# Patient Record
Sex: Male | Born: 1970
Health system: Southern US, Community
[De-identification: ages and names within clinical notes are randomized; demographics above are authoritative.]

## PROBLEM LIST (undated history)

## (undated) DIAGNOSIS — R0602 Shortness of breath: Secondary | ICD-10-CM

## (undated) DIAGNOSIS — M1990 Unspecified osteoarthritis, unspecified site: Secondary | ICD-10-CM

## (undated) DIAGNOSIS — T50901A Poisoning by unspecified drugs, medicaments and biological substances, accidental (unintentional), initial encounter: Secondary | ICD-10-CM

## (undated) DIAGNOSIS — S83200A Bucket-handle tear of unspecified meniscus, current injury, right knee, initial encounter: Secondary | ICD-10-CM

## (undated) DIAGNOSIS — G8929 Other chronic pain: Secondary | ICD-10-CM

## (undated) DIAGNOSIS — B370 Candidal stomatitis: Secondary | ICD-10-CM

## (undated) DIAGNOSIS — M25562 Pain in left knee: Secondary | ICD-10-CM

## (undated) DIAGNOSIS — J449 Chronic obstructive pulmonary disease, unspecified: Secondary | ICD-10-CM

## (undated) DIAGNOSIS — H269 Unspecified cataract: Secondary | ICD-10-CM

## (undated) DIAGNOSIS — K219 Gastro-esophageal reflux disease without esophagitis: Secondary | ICD-10-CM

## (undated) DIAGNOSIS — G4730 Sleep apnea, unspecified: Secondary | ICD-10-CM

## (undated) DIAGNOSIS — I509 Heart failure, unspecified: Secondary | ICD-10-CM

## (undated) DIAGNOSIS — G9340 Encephalopathy, unspecified: Secondary | ICD-10-CM

## (undated) DIAGNOSIS — J45909 Unspecified asthma, uncomplicated: Secondary | ICD-10-CM

## (undated) DIAGNOSIS — M549 Dorsalgia, unspecified: Secondary | ICD-10-CM

## (undated) DIAGNOSIS — F419 Anxiety disorder, unspecified: Secondary | ICD-10-CM

## (undated) DIAGNOSIS — R109 Unspecified abdominal pain: Secondary | ICD-10-CM

## (undated) HISTORY — DX: Bucket-handle tear of unspecified meniscus, current injury, right knee, initial encounter: S83.200A

## (undated) HISTORY — PX: CARPAL TUNNEL RELEASE: SHX101

## (undated) HISTORY — DX: Sleep apnea, unspecified: G47.30

## (undated) HISTORY — DX: Chronic obstructive pulmonary disease, unspecified: J44.9

## (undated) HISTORY — DX: Unspecified cataract: H26.9

## (undated) HISTORY — PX: SHOULDER ARTHROCENTESIS: SHX1048

## (undated) HISTORY — DX: Poisoning by unspecified drugs, medicaments and biological substances, accidental (unintentional), initial encounter: T50.901A

## (undated) HISTORY — DX: Unspecified abdominal pain: R10.9

---
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
## 2001-03-14 ENCOUNTER — Encounter: Payer: Self-pay | Admitting: Emergency Medicine

## 2001-03-14 ENCOUNTER — Emergency Department (HOSPITAL_COMMUNITY): Admission: EM | Admit: 2001-03-14 | Discharge: 2001-03-14 | Payer: Self-pay | Admitting: Emergency Medicine

## 2002-10-22 ENCOUNTER — Encounter: Payer: Self-pay | Admitting: Family Medicine

## 2002-10-22 ENCOUNTER — Inpatient Hospital Stay (HOSPITAL_COMMUNITY): Admission: AD | Admit: 2002-10-22 | Discharge: 2002-10-25 | Payer: Self-pay | Admitting: Family Medicine

## 2003-12-13 ENCOUNTER — Ambulatory Visit (HOSPITAL_COMMUNITY): Admission: RE | Admit: 2003-12-13 | Discharge: 2003-12-13 | Payer: Self-pay | Admitting: Family Medicine

## 2006-07-29 ENCOUNTER — Ambulatory Visit (HOSPITAL_COMMUNITY): Admission: RE | Admit: 2006-07-29 | Discharge: 2006-07-29 | Payer: Self-pay | Admitting: Family Medicine

## 2007-01-14 ENCOUNTER — Encounter: Admission: RE | Admit: 2007-01-14 | Discharge: 2007-01-14 | Payer: Self-pay | Admitting: Orthopaedic Surgery

## 2007-01-20 ENCOUNTER — Ambulatory Visit (HOSPITAL_BASED_OUTPATIENT_CLINIC_OR_DEPARTMENT_OTHER): Admission: RE | Admit: 2007-01-20 | Discharge: 2007-01-20 | Payer: Self-pay | Admitting: Orthopaedic Surgery

## 2008-05-05 ENCOUNTER — Emergency Department (HOSPITAL_COMMUNITY): Admission: EM | Admit: 2008-05-05 | Discharge: 2008-05-05 | Payer: Self-pay | Admitting: Emergency Medicine

## 2008-08-15 ENCOUNTER — Emergency Department (HOSPITAL_COMMUNITY): Admission: EM | Admit: 2008-08-15 | Discharge: 2008-08-15 | Payer: Self-pay | Admitting: Emergency Medicine

## 2008-12-02 HISTORY — PX: HERNIA REPAIR: SHX51

## 2009-07-15 ENCOUNTER — Emergency Department (HOSPITAL_COMMUNITY): Admission: EM | Admit: 2009-07-15 | Discharge: 2009-07-15 | Payer: Self-pay | Admitting: Emergency Medicine

## 2009-09-22 ENCOUNTER — Ambulatory Visit (HOSPITAL_COMMUNITY): Admission: RE | Admit: 2009-09-22 | Discharge: 2009-09-22 | Payer: Self-pay | Admitting: Family Medicine

## 2009-10-01 ENCOUNTER — Emergency Department (HOSPITAL_COMMUNITY): Admission: EM | Admit: 2009-10-01 | Discharge: 2009-10-01 | Payer: Self-pay | Admitting: Emergency Medicine

## 2009-10-03 ENCOUNTER — Ambulatory Visit: Payer: Self-pay | Admitting: Gastroenterology

## 2009-10-03 DIAGNOSIS — R109 Unspecified abdominal pain: Secondary | ICD-10-CM

## 2009-10-03 DIAGNOSIS — R1084 Generalized abdominal pain: Secondary | ICD-10-CM | POA: Insufficient documentation

## 2009-10-03 HISTORY — DX: Unspecified abdominal pain: R10.9

## 2009-10-04 ENCOUNTER — Encounter (INDEPENDENT_AMBULATORY_CARE_PROVIDER_SITE_OTHER): Payer: Self-pay | Admitting: *Deleted

## 2009-10-04 ENCOUNTER — Ambulatory Visit (HOSPITAL_COMMUNITY): Admission: RE | Admit: 2009-10-04 | Discharge: 2009-10-04 | Payer: Self-pay | Admitting: Gastroenterology

## 2009-10-04 ENCOUNTER — Ambulatory Visit: Payer: Self-pay | Admitting: Gastroenterology

## 2009-10-04 ENCOUNTER — Encounter: Payer: Self-pay | Admitting: Gastroenterology

## 2009-11-22 ENCOUNTER — Encounter (INDEPENDENT_AMBULATORY_CARE_PROVIDER_SITE_OTHER): Payer: Self-pay | Admitting: *Deleted

## 2009-11-23 ENCOUNTER — Ambulatory Visit (HOSPITAL_COMMUNITY): Admission: RE | Admit: 2009-11-23 | Discharge: 2009-11-23 | Payer: Self-pay | Admitting: Surgery

## 2010-04-26 ENCOUNTER — Encounter
Admission: RE | Admit: 2010-04-26 | Discharge: 2010-07-25 | Payer: Self-pay | Admitting: Physical Medicine & Rehabilitation

## 2010-05-01 ENCOUNTER — Ambulatory Visit: Payer: Self-pay | Admitting: Physical Medicine & Rehabilitation

## 2010-06-21 ENCOUNTER — Ambulatory Visit: Payer: Self-pay | Admitting: Physical Medicine & Rehabilitation

## 2010-07-01 ENCOUNTER — Emergency Department (HOSPITAL_COMMUNITY): Admission: EM | Admit: 2010-07-01 | Discharge: 2010-07-01 | Payer: Self-pay | Admitting: Emergency Medicine

## 2010-07-20 ENCOUNTER — Ambulatory Visit: Payer: Self-pay | Admitting: Physical Medicine & Rehabilitation

## 2010-08-08 ENCOUNTER — Encounter
Admission: RE | Admit: 2010-08-08 | Discharge: 2010-08-15 | Payer: Self-pay | Source: Home / Self Care | Admitting: Physical Medicine & Rehabilitation

## 2010-08-15 ENCOUNTER — Ambulatory Visit: Payer: Self-pay | Admitting: Physical Medicine & Rehabilitation

## 2011-02-03 ENCOUNTER — Emergency Department (HOSPITAL_COMMUNITY)
Admission: EM | Admit: 2011-02-03 | Discharge: 2011-02-03 | Disposition: A | Discharge status: Home / Self Care | Payer: BC Managed Care – PPO | Attending: Emergency Medicine | Admitting: Emergency Medicine

## 2011-02-03 DIAGNOSIS — Y92009 Unspecified place in unspecified non-institutional (private) residence as the place of occurrence of the external cause: Secondary | ICD-10-CM | POA: Insufficient documentation

## 2011-02-03 DIAGNOSIS — W268XXA Contact with other sharp object(s), not elsewhere classified, initial encounter: Secondary | ICD-10-CM | POA: Insufficient documentation

## 2011-02-03 DIAGNOSIS — W01119A Fall on same level from slipping, tripping and stumbling with subsequent striking against unspecified sharp object, initial encounter: Secondary | ICD-10-CM | POA: Insufficient documentation

## 2011-02-03 DIAGNOSIS — S61409A Unspecified open wound of unspecified hand, initial encounter: Secondary | ICD-10-CM | POA: Insufficient documentation

## 2011-02-16 LAB — BASIC METABOLIC PANEL
BUN: 8 mg/dL (ref 6–23)
CO2: 25 mEq/L (ref 19–32)
Calcium: 8.9 mg/dL (ref 8.4–10.5)
Chloride: 107 mEq/L (ref 96–112)
Creatinine, Ser: 0.82 mg/dL (ref 0.4–1.5)
GFR calc Af Amer: >60 mL/min (ref >60)
GFR calc non Af Amer: >60 mL/min (ref >60)
Glucose, Bld: 122 mg/dL — ABNORMAL HIGH (ref 70–99)
Potassium: 3.7 mEq/L (ref 3.5–5.1)
Sodium: 139 mEq/L (ref 135–145)

## 2011-02-16 LAB — CBC
HCT: 40.6 % (ref 39.0–52.0)
Hemoglobin: 13.9 g/dL (ref 13.0–17.0)
MCH: 31.3 pg (ref 26.0–34.0)
MCHC: 34.2 g/dL (ref 30.0–36.0)
MCV: 91.5 fL (ref 78.0–100.0)
Platelets: 236 10*3/uL (ref 150–400)
RBC: 4.44 MIL/uL (ref 4.22–5.81)
RDW: 13 % (ref 11.5–15.5)
WBC: 12.1 K/uL — ABNORMAL HIGH (ref 4.0–10.5)

## 2011-02-16 LAB — URINALYSIS, ROUTINE W REFLEX MICROSCOPIC
Bilirubin Urine: NEGATIVE
Glucose, UA: NEGATIVE mg/dL
Hgb urine dipstick: NEGATIVE
Nitrite: NEGATIVE
Protein, ur: NEGATIVE mg/dL
Specific Gravity, Urine: >1.03 — ABNORMAL HIGH (ref 1.005–1.030)
Urobilinogen, UA: 0.2 mg/dL (ref 0.0–1.0)
pH: 6 (ref 5.0–8.0)

## 2011-02-16 LAB — DIFFERENTIAL
Basophils Absolute: 0.1 K/uL (ref 0.0–0.1)
Basophils Relative: 1 % (ref 0–1)
Eosinophils Absolute: 0.5 10*3/uL (ref 0.0–0.7)
Eosinophils Relative: 4 % (ref 0–5)
Lymphocytes Relative: 25 % (ref 12–46)
Lymphs Abs: 3 10*3/uL (ref 0.7–4.0)
Monocytes Absolute: 0.8 K/uL (ref 0.1–1.0)
Monocytes Relative: 7 % (ref 3–12)
Neutro Abs: 7.7 10*3/uL (ref 1.7–7.7)
Neutrophils Relative %: 63 % (ref 43–77)

## 2011-03-04 LAB — COMPREHENSIVE METABOLIC PANEL
ALT: 26 U/L (ref 0–53)
AST: 21 U/L (ref 0–37)
Albumin: 3.9 g/dL (ref 3.5–5.2)
Alkaline Phosphatase: 44 U/L (ref 39–117)
CO2: 29 mEq/L (ref 19–32)
Chloride: 101 mEq/L (ref 96–112)
GFR calc non Af Amer: >60 mL/min (ref >60)
Total Bilirubin: 0.6 mg/dL (ref 0.3–1.2)

## 2011-03-04 LAB — GLUCOSE, CAPILLARY: Glucose-Capillary: 206 mg/dL — ABNORMAL HIGH (ref 70–99)

## 2011-03-06 LAB — GLUCOSE, CAPILLARY: Glucose-Capillary: 160 mg/dL — ABNORMAL HIGH (ref 70–99)

## 2011-03-07 LAB — DIFFERENTIAL
Basophils Relative: 1 % (ref 0–1)
Eosinophils Relative: 3 % (ref 0–5)
Lymphs Abs: 2.9 10*3/uL (ref 0.7–4.0)
Monocytes Absolute: 0.5 10*3/uL (ref 0.1–1.0)
Neutro Abs: 6.4 10*3/uL (ref 1.7–7.7)

## 2011-03-07 LAB — BASIC METABOLIC PANEL
BUN: 5 mg/dL — ABNORMAL LOW (ref 6–23)
Chloride: 103 mEq/L (ref 96–112)
Sodium: 138 mEq/L (ref 135–145)

## 2011-03-07 LAB — CBC
Hemoglobin: 16 g/dL (ref 13.0–17.0)
MCHC: 35 g/dL (ref 30.0–36.0)
MCV: 90.5 fL (ref 78.0–100.0)
RDW: 13.4 % (ref 11.5–15.5)

## 2011-03-09 LAB — RAPID STREP SCREEN (MED CTR MEBANE ONLY): Streptococcus, Group A Screen (Direct): NEGATIVE

## 2011-04-19 NOTE — Discharge Summary (Signed)
   NAME:  Joseph [name], Joseph [name]                           ACCOUNT NO.:  [id number]   MEDICAL RECORD NO.:  [id number]                   PATIENT TYPE:  INP   LOCATION:  A326                                 FACILITY:  [facility]   PHYSICIAN:  Scott A. Gerda Diss, M.D.               DATE OF BIRTH:  [birth year]   DATE OF ADMISSION:  10/22/2002  DATE OF DISCHARGE:  10/25/2002                                 DISCHARGE SUMMARY   DISCHARGE DIAGNOSES:  1. Chronic obstructive pulmonary disease.  2. Bronchitis.   HOSPITAL COURSE:  The patient was admitted in with shortness of breath,  flare-up of his COPD, coughing; was treated with Solu-Medrol, nebulizer  treatments, and Levaquin.  The patient had alpha-1 antitrypsin level which  was 192, which was normal.  He had a mild elevation of his glucose.  His  hemoglobin and hematocrit were good.  Hemoglobin A1c was slightly elevated  at 6.9.  A chest x-ray showed some diffuse infection pattern noted.  He was  treated, monitored closely, improved gradually, and was discharged on his  usual medicines, Levaquin one daily, Zithromax for the next four days, and a  prednisone taper, along with his nebulizer treatments and to follow up next  week for recheck and follow up glucose as an outpatient.                                               Scott A. Gerda Diss, M.D.    SAL/MEDQ  D:  02/11/2003  T:  02/11/2003  Job:  [id number]

## 2011-04-19 NOTE — Op Note (Signed)
Joseph [name], HEDGLIN                 ACCOUNT NO.:  [id number]   MEDICAL RECORD NO.:  [id number]          PATIENT TYPE:  AMB   LOCATION:  DSC                          FACILITY:  [facility]   PHYSICIAN:  Lubertha Basque. Dalldorf, M.D.DATE OF BIRTH:  10-17-1971   DATE OF PROCEDURE:  01/20/2007  DATE OF DISCHARGE:                               OPERATIVE REPORT   PREOPERATIVE DIAGNOSIS:  1. Right shoulder impingement.  2. Right shoulder acromioclavicular degeneration.   POSTOPERATIVE DIAGNOSIS:  1. Right shoulder impingement.  2. Right shoulder acromioclavicular degeneration.   PROCEDURE:  1. Right shoulder arthroscopic acromioplasty.  2. Right shoulder arthroscopic acromioclavicular resection.  3. Right shoulder arthroscopic cuff debridement.   ANESTHESIA:  General and block.   ATTENDING SURGEON:  Lubertha Basque. Jerl Santos, M.D.   ASSISTANT:  Lindwood Qua, P.A.-C.   INDICATIONS FOR PROCEDURE:  The patient is a 40 year old man who is 3 or  4 months from a work related injury he sustained while lifting.  He has  had intense pain in the right shoulder since that time.  He has failed  conservative measures of oral anti-inflammatories and an exercise  program.  He has also had two injections, the second of which did afford  him transient relief.  He has undergone an MRI scan which shows things  consistent with impingement related to the shape of his AC joint and the  acromion.  With pain on attempted use which limits his ability to work  and with pain at rest, he is offered an arthroscopy.  Informed operative  consent was obtained after a discussion of possible complications of  reaction to anesthesia and infection.   SUMMARY OF FINDINGS AND PROCEDURE:  Under general anesthesia and a  block, a right shoulder arthroscopy was performed.  The glenohumeral  joint showed no degenerative changes and the biceps tendon and rotator  cuff appeared benign from below.  In the subacromial space, he had a  prominent subacromial morphology addressed with an acromioplasty back to  a flat surface.  He also had a large spur under the [facility] joint and some  near bone-on-bone contact here and a formal AC decompression was done.  He had some fraying of the rotator cuff on the bursal aspect consistent  with a partial thickness tear and a debridement was done, but nothing  significant enough to require a repair was found.  Bryna Colander  assisted throughout and was invaluable to the completion of the case in  that he helped position and pass instruments while I performed the  procedure.   DESCRIPTION OF PROCEDURE:  The patient was taken to the operating suite  where general anesthetic was applied without difficulty.  He was also  given a block in the preanesthesia area.  He was positioned in a beach  chair position and prepped and draped in a normal sterile fashion.  After the administration of preop IV Kefzol, an arthroscopy of the right  shoulder was performed through a total of three portals.  Findings were  as noted above.  The procedure initially consisted of an acromioplasty  back to  a flat surface.  This was done with the bur in the lateral  position followed by transfer of the bur to the posterior position.  We  then performed a formal AC decompression with the bur in the anterior  position, creating about 1 cm of space between the clavicle and  acromion.  The rotator cuff did exhibit some fraying on the superior  surface consistent with a bursal side partial thickness tear.  A  debridement was done here.  The shoulder was thoroughly irrigated at the  end of the case followed by reapproximation of the portals loosely with  nylon.  Adaptic was applied to the portals followed by dry gauze and  tape.  Estimated blood loss and interoperative fluids can be obtained  from anesthesia records.   DISPOSITION:  The patient was extubated in the operating room and taken  to the recovery room in stable  condition.  He was to go home on the same  day and to follow up in the office in less than a week.  I will contact  him by phone tonight.      Lubertha Basque Jerl Santos, M.D.  Electronically Signed     PGD/MEDQ  D:  01/20/2007  T:  01/20/2007  Job:  [id number]

## 2011-04-19 NOTE — H&P (Signed)
   NAMETERESO, UNANGST                             ACCOUNT NO.:  [id number]   MEDICAL RECORD NO.:  [id number]                  PATIENT TYPE:   LOCATION:                                       FACILITY:   PHYSICIAN:  Scott A. Gerda Diss, M.D.               DATE OF BIRTH:   DATE OF ADMISSION:  10/22/2002  DATE OF DISCHARGE:                                HISTORY & PHYSICAL   CHIEF COMPLAINT:  Cough and shortness of breath.   HISTORY OF PRESENT ILLNESS:  This is a 40 year old white male with a strong  history of smoking, strong family history of COPD, and premature emphysema  who presents with severe cough and wheezing and difficulty breathing. He has  been followed in the past by another doctor.  He has been on multiple  medications, including neb treatments and Atrovent.  He has been having  trouble with his breathing over the past few days, worse today.  He feels  short of breath even despite frequent nebulizer treatment.   PAST MEDICAL HISTORY:  The patient denies being hospitalized in the past.   SOCIAL HISTORY:  Smokes 1-1/2 packs a day, all the way up until September,  and he cut it down to half a pack a day.   FAMILY HISTORY:  Strong for COPD and premature emphysema.   ALLERGIES:  ERYTHROMYCIN causes hives.   MEDICATIONS:  Atrovent, albuterol, Paxil 40 mg q. day, Prevacid 30 mg q.  day, Advair 250/50 one inhalation b.i.d., Allegra 180 mg q. day, Rhinocort  q.d.   REVIEW OF SYSTEMS:  Negative for headache, sinus pain, drainage, except in  the past few days he has had the wheezing.  No abdominal pains.  No  diarrhea. Neurologic grossly normal.   PHYSICAL EXAMINATION:  HEENT: Benign.  NECK:  No masses.  CHEST:  Bilateral inspiratory wheezes with tachypnea.  HEART:  Regular.  ABDOMEN: Soft.  EXTREMITIES:  No edema.  NEUROLOGIC:  Grossly normal.   The patient was treated with nebulizer treatment in the office but did not  improve any, and it was thought that the patient  needed to be admitted into  the hospital.    ASSESSMENT AND PLAN:  Chronic obstructive pulmonary disease--to do chest x-  ray and lab work.  Cover with antibiotics, Solu-Medrol, and frequent neb  treatments.  Follow closely.  Also check alpha-I antitrypsin level.                                               Scott A. Gerda Diss, M.D.    SAL/MEDQ  D:  10/25/2002  T:  10/25/2002  Job:  [id number]

## 2011-09-04 LAB — DIFFERENTIAL
Basophils Absolute: 0.1
Lymphs Abs: 2.8
Monocytes Relative: 6
Neutro Abs: 5.3
Neutrophils Relative %: 60

## 2011-09-04 LAB — BASIC METABOLIC PANEL
CO2: 26
Calcium: 9.7
Chloride: 103
GFR calc non Af Amer: >60
Sodium: 137

## 2011-09-04 LAB — CBC
Hemoglobin: 15
MCV: 89.6
Platelets: 235
RBC: 4.78
WBC: 8.9

## 2011-09-04 LAB — POCT CARDIAC MARKERS: Troponin i, poc: <0.05

## 2011-10-22 ENCOUNTER — Ambulatory Visit: Payer: BC Managed Care – PPO | Attending: Family Medicine | Admitting: Sleep Medicine

## 2011-10-22 DIAGNOSIS — Z6841 Body Mass Index (BMI) 40.0 and over, adult: Secondary | ICD-10-CM | POA: Insufficient documentation

## 2011-10-22 DIAGNOSIS — R0683 Snoring: Secondary | ICD-10-CM

## 2011-10-22 DIAGNOSIS — G4733 Obstructive sleep apnea (adult) (pediatric): Secondary | ICD-10-CM | POA: Insufficient documentation

## 2011-10-23 ENCOUNTER — Encounter: Payer: Self-pay | Admitting: Sleep Medicine

## 2011-10-23 NOTE — Progress Notes (Addendum)
Pt arrived to [facility] and reported that he had already taken all his medications for the night.  However during the night when questioned again about this he stated they were taken them after arriving to the lab ~2030.  Medications he listed were Glyburide 5 mg Tab 1 tab in morning and 1/2 tab at night, Bupropion HCL 100 mg tab 2 tabs by mouth 2 times daily, Prevacid 30 mg capsule 1 by mouth 2 times daily (he stated he took these at lab.  Other medications listed  were Albuteral and Atrovent for nebulizer.  Pt split night study ordered and protocol met.  However the order was put in as polysomnogram with 4 parameters and not with CPAP as it should have been

## 2011-11-02 NOTE — Procedures (Signed)
NAMERUAL, VERMEER                 ACCOUNT NO.:  [id number]  MEDICAL RECORD NO.:  [id number]          PATIENT TYPE:  OUT  LOCATION:  SLEEP LAB                     FACILITY:  [facility]  PHYSICIAN:  Mittie Knittel A. Gerilyn Pilgrim, M.D. DATE OF BIRTH:  [birth year]  DATE OF STUDY:  11/01/2011                           NOCTURNAL POLYSOMNOGRAM  REFERRING PHYSICIAN:  Ardyth Gal  REFERRING PHYSICIAN:  Ardyth Gal, MD  INDICATIONS:  This 40 year old who presents with snoring, fatigue, and obesity.  The study is being done to evaluate for obstructive sleep apnea syndrome.  MEDICATIONS:  Percocet, glyburide, Wellbutrin, Prevacid, albuterol, and Atrovent.  EPWORTH SLEEPINESS SCALE: 1.  BMI 40.  ARCHITECTURAL SUMMARY:  This is a split night recording with initial portion being a diagnostic and 2nd portion a titration recording.  The total recording time is 405 minutes.  Sleep efficiency is 54%.  Sleep latency is 44 minutes.  REM latency 0 minutes.  RESPIRATORY SUMMARY:  Baseline oxygen saturation is 94, lowest saturation 86.  Diagnostic AHI is 19.  The patient was started on positive pressure between 5 and 10.  Did well at a pressure of 8, high- pressure is 10 that cause central hypopneas and insomnia.  LIMB MOVEMENT SUMMARY:  PLM index is 0.  ELECTROCARDIOGRAM SUMMARY:  Average heart rate is 85 with no significant dysrhythmias observed.  IMPRESSION:  Moderate obstructive sleep apnea syndrome, which responds optimally to a CPAP of 8.  Thanks for this referral.   Bryce Cheever A. Gerilyn Pilgrim, M.D.    KAD/MEDQ  D:  11/01/2011 08:51:14  T:  11/02/2011 01:41:35  Job:  [id number]

## 2012-03-07 ENCOUNTER — Emergency Department (HOSPITAL_COMMUNITY): Payer: BC Managed Care – PPO

## 2012-03-07 ENCOUNTER — Emergency Department (HOSPITAL_COMMUNITY)
Admission: EM | Admit: 2012-03-07 | Discharge: 2012-03-07 | Disposition: A | Discharge status: Home / Self Care | Payer: BC Managed Care – PPO | Attending: Emergency Medicine | Admitting: Emergency Medicine

## 2012-03-07 ENCOUNTER — Other Ambulatory Visit: Payer: Self-pay

## 2012-03-07 ENCOUNTER — Encounter (HOSPITAL_COMMUNITY): Payer: Self-pay | Admitting: Emergency Medicine

## 2012-03-07 DIAGNOSIS — R05 Cough: Secondary | ICD-10-CM | POA: Insufficient documentation

## 2012-03-07 DIAGNOSIS — J449 Chronic obstructive pulmonary disease, unspecified: Secondary | ICD-10-CM

## 2012-03-07 DIAGNOSIS — Z79899 Other long term (current) drug therapy: Secondary | ICD-10-CM | POA: Insufficient documentation

## 2012-03-07 DIAGNOSIS — R059 Cough, unspecified: Secondary | ICD-10-CM | POA: Insufficient documentation

## 2012-03-07 DIAGNOSIS — R509 Fever, unspecified: Secondary | ICD-10-CM | POA: Insufficient documentation

## 2012-03-07 DIAGNOSIS — R1110 Vomiting, unspecified: Secondary | ICD-10-CM | POA: Insufficient documentation

## 2012-03-07 DIAGNOSIS — J4489 Other specified chronic obstructive pulmonary disease: Secondary | ICD-10-CM | POA: Insufficient documentation

## 2012-03-07 DIAGNOSIS — R490 Dysphonia: Secondary | ICD-10-CM | POA: Insufficient documentation

## 2012-03-07 DIAGNOSIS — R0602 Shortness of breath: Secondary | ICD-10-CM | POA: Insufficient documentation

## 2012-03-07 DIAGNOSIS — J189 Pneumonia, unspecified organism: Secondary | ICD-10-CM | POA: Insufficient documentation

## 2012-03-07 DIAGNOSIS — R079 Chest pain, unspecified: Secondary | ICD-10-CM | POA: Insufficient documentation

## 2012-03-07 DIAGNOSIS — E119 Type 2 diabetes mellitus without complications: Secondary | ICD-10-CM | POA: Insufficient documentation

## 2012-03-07 LAB — COMPREHENSIVE METABOLIC PANEL
Alkaline Phosphatase: 69 U/L (ref 39–117)
BUN: 10 mg/dL (ref 6–23)
CO2: 22 mEq/L (ref 19–32)
Chloride: 99 mEq/L (ref 96–112)
Creatinine, Ser: 0.97 mg/dL (ref 0.50–1.35)
GFR calc non Af Amer: >90 mL/min (ref >90)
Total Bilirubin: 0.4 mg/dL (ref 0.3–1.2)

## 2012-03-07 LAB — DIFFERENTIAL
Lymphocytes Relative: 28 % (ref 12–46)
Lymphs Abs: 1.7 10*3/uL (ref 0.7–4.0)
Monocytes Absolute: 0.7 10*3/uL (ref 0.1–1.0)
Monocytes Relative: 12 % (ref 3–12)
Neutro Abs: 3.5 10*3/uL (ref 1.7–7.7)

## 2012-03-07 LAB — CBC
HCT: 42.4 % (ref 39.0–52.0)
Hemoglobin: 14 g/dL (ref 13.0–17.0)
RBC: 4.73 MIL/uL (ref 4.22–5.81)
WBC: 6 10*3/uL (ref 4.0–10.5)

## 2012-03-07 LAB — TROPONIN I: Troponin I: <0.3 ng/mL (ref <0.30)

## 2012-03-07 LAB — D-DIMER, QUANTITATIVE: D-Dimer, Quant: 0.46 ug/mL-FEU (ref 0.00–0.48)

## 2012-03-07 MED ORDER — SODIUM CHLORIDE 0.9 % IV SOLN
Freq: Once | INTRAVENOUS | Status: AC
  Administered 2012-03-07: 50 mL/h via INTRAVENOUS

## 2012-03-07 MED ORDER — PREDNISONE 50 MG PO TABS: ORAL_TABLET | ORAL | Status: AC

## 2012-03-07 MED ORDER — MOXIFLOXACIN HCL IN NACL 400 MG/250ML IV SOLN
400.0000 mg | Freq: Once | INTRAVENOUS | Status: AC
  Administered 2012-03-07: 400 mg via INTRAVENOUS
  Filled 2012-03-07: qty 250

## 2012-03-07 MED ORDER — IPRATROPIUM BROMIDE 0.02 % IN SOLN
0.5000 mg | Freq: Once | RESPIRATORY_TRACT | Status: AC
  Administered 2012-03-07: 0.5 mg via RESPIRATORY_TRACT
  Filled 2012-03-07: qty 2.5

## 2012-03-07 MED ORDER — ALBUTEROL SULFATE (5 MG/ML) 0.5% IN NEBU
2.5000 mg | INHALATION_SOLUTION | Freq: Once | RESPIRATORY_TRACT | Status: AC
Start: 2012-03-07 — End: 2012-03-07
  Administered 2012-03-07: 2.5 mg via RESPIRATORY_TRACT
  Filled 2012-03-07: qty 0.5

## 2012-03-07 MED ORDER — IOHEXOL 350 MG/ML SOLN
80.0000 mL | Freq: Once | INTRAVENOUS | Status: AC | PRN
  Administered 2012-03-07: 80 mL via INTRAVENOUS

## 2012-03-07 MED ORDER — MOXIFLOXACIN HCL 400 MG PO TABS: 400.0000 mg | ORAL_TABLET | Freq: Every day | ORAL | Status: AC

## 2012-03-07 MED ORDER — IOHEXOL 300 MG/ML  SOLN
100.0000 mL | Freq: Once | INTRAMUSCULAR | Status: AC | PRN
  Administered 2012-03-07: 100 mL via INTRAVENOUS

## 2012-03-07 MED ORDER — ALBUTEROL SULFATE HFA 108 (90 BASE) MCG/ACT IN AERS: 1.0000 | INHALATION_SPRAY | Freq: Four times a day (QID) | RESPIRATORY_TRACT | Status: DC | PRN

## 2012-03-07 NOTE — ED Notes (Signed)
Pt presents with c/o productive cough, headache, SOB and low grade fevers. Pt has a history of COPD, diabetes and bronchitis.  No acute distress noted at this time.

## 2012-03-07 NOTE — ED Provider Notes (Signed)
History    Scribed for Glynn Octave, MD, the patient was seen in room APA12/APA12. This chart was scribed by Katha Cabal.   CSN: [id number]  Arrival date & time 03/07/12  1538   First MD Initiated Contact with Patient 03/07/12 1545      Chief Complaint  Patient presents with  . Cough  . Fever    (Consider location/radiation/quality/duration/timing/severity/associated sxs/prior treatment) HPI  Pt was seen at 3:51 PM   Joseph [name] is a 41 y.o. male with COPD presents to the Emergency Department complaining for persistent productive cough with thick clear sputum for the past 5 days.  Patient with shortness of breath while walking around and central chest pain due to coughing. Chest pain does not radiate.   Symptoms are associated with dysphonia, vomiting after coughing, fever (101 F).  Patient is a smoker.  Patient does not use oxygen at home.  Daughter was recently sick at home with cold signs and symptoms.     PCP [name], MD, MD    Past Medical History  Diagnosis Date  . Diabetes mellitus   . COPD (chronic obstructive pulmonary disease)     Past Surgical History  Procedure Date  . Carpal tunnel release   . Shoulder arthrocentesis   . Hernia repair     No family history on file.  History  Substance Use Topics  . Smoking status: Current Everyday Smoker -- 2.0 packs/day for 20 years    Types: Cigarettes  . Smokeless tobacco: Not on file  . Alcohol Use: No      Review of Systems  All other systems reviewed and are negative.  Remaining review of systems negative except as noted in the HPI.    Allergies  Azithromycin; Erythromycin; Metformin; and Doxycycline  Home Medications   Current Outpatient Rx  Name Route Sig Dispense Refill  . BUPROPION HCL ER (SR) 200 MG PO TB12 Oral Take 200 mg by mouth 2 (two) times daily.    . GLYBURIDE 5 MG PO TABS Oral Take 2.5-5 mg by mouth 2 (two) times daily. Patient takes 1 tablet in the morning and 1/2 tablet  at night    . LANSOPRAZOLE 30 MG PO CPDR Oral Take 30 mg by mouth 2 (two) times daily.    . OXYCODONE-ACETAMINOPHEN 5-325 MG PO TABS Oral Take 1 tablet by mouth every 4 (four) hours as needed. pain      BP 118/74  Pulse 102  Temp(Src) 98.2 F (36.8 C) (Oral)  Resp 18  Ht 5\' 10"  (1.778 m)  Wt 270 lb (122.471 kg)  BMI 38.74 kg/m2  SpO2 100%  Physical Exam  Nursing note and vitals reviewed. Constitutional: He is oriented to person, place, and time. He appears well-developed.  Non-toxic appearance.  HENT:  Head: Normocephalic and atraumatic.       Mild oropharynx erythema   Eyes: Conjunctivae and EOM are normal.  Neck: Normal range of motion. Neck supple.       No meningismus   Cardiovascular: Normal rate and regular rhythm.   No murmur heard. Pulmonary/Chest: Effort normal. No respiratory distress. He has no wheezes. He has no rales.       Diminished breath sounds   Abdominal: There is no tenderness. There is no rebound and no guarding.  Musculoskeletal: Normal range of motion. He exhibits no edema.  Neurological: He is alert and oriented to person, place, and time. No sensory deficit. Coordination normal.  Skin: Skin is warm and dry. No  rash noted.  Psychiatric: He has a normal mood and affect. His behavior is normal.    ED Course  Procedures (including critical care time)   DIAGNOSTIC STUDIES: Oxygen Saturation is 98% on room air, normal by my interpretation.    EKG:  Date: 03/07/2012  Rate: 103 Rhythm: sinus tachycardia  QRS Axis: normal  Intervals: normal  ST/T Wave abnormalities: normal  Conduction Disutrbances:none  Narrative Interpretation:   Old EKG Reviewed: unchanged    COORDINATION OF CARE: 3:55 PM  Physical exam complete.  Will order CXR and breathing treatment.   4:00 PM  Proventil and Atrovent ordered.   5:23 PM  Recheck.  Discussed CXR findings. Patient with atypical PNA.  Will order CT Chest as recommended to evaluate possible pulmonary nodule.     6:59 PM  Recheck.  Discussed CT radiological findings with patient. Plan to discharge patient.  Patient agrees with plan.     LABS / RADIOLOGY:   Labs Reviewed  COMPREHENSIVE METABOLIC PANEL - Abnormal; Notable for the following:    Glucose, Bld 117 (*)    AST 39 (*)    All other components within normal limits  CBC  DIFFERENTIAL  TROPONIN I  D-DIMER, QUANTITATIVE   Dg Chest 2 View  03/07/2012  *RADIOLOGY REPORT*  Clinical Data: Cough and congestion.  CHEST - 2 VIEW  Comparison: 11/23/2009  Findings: Thoracic spondylosis noted. Cardiac and mediastinal contours appear unremarkable.  Faint interstitial accentuation noted with minimal airway thickening.  No pleural effusion is observed.  Indistinct 1.1 cm nodular density noted at the right lung base peripherally, possibly a nipple shadow but I cannot exclude a true pulmonary nodule.  This does not project over a rib shadow and accordingly is not thought to represent the callus from the right ninth rib posteriorly shown on prior CT abdomen from 07/01/2010.  IMPRESSION:  1.  Mild airway thickening and interstitial accentuation, query bronchitis or mild atypical pneumonia. 2.  Possible pulmonary nodule the right lung base.  This finding is indistinct, but is not thought to represent an osseous abnormality. I doubt that this is a nipple shadow.  CT scan of the chest is recommended for further characterization, to exclude malignancy. 3.  Thoracic spondylosis.  Original Report Authenticated By: Dellia Cloud, M.D.   Ct Angio Chest W/cm &/or Wo Cm  03/07/2012  *RADIOLOGY REPORT*  Clinical Data: Diabetes.  Cough.  COPD.  CT ANGIOGRAPHY CHEST  Technique:  Multidetector CT imaging of the chest using the standard protocol during bolus administration of intravenous contrast. Multiplanar reconstructed images including MIPs were obtained and reviewed to evaluate the vascular anatomy.  Contrast: OMNIPAQUE IOHEXOL 300 MG/ML  SOLN, 80mL OMNIPAQUE IOHEXOL  350 MG/ML SOLN  Comparison: 03/07/2012  Findings: Initial automatic bolus of contrast was performed to relate to assess the pulmonary arteries.  I authorized a second bolus of 80 ml of contrast medium given the patient's normal renal function in order to obtain a diagnostic study for pulmonary embolus.  An abnormal right lower paratracheal node has a short axis diameter of 1.6 cm.  An abnormal AP window lymph node has a short axis diameter of 1.4 cm.  Mild bilateral hilar and infrahilar adenopathy noted.  Scattered small axillary lymph nodes are present.  A subcarinal node has a short axis diameter of 1.5 cm.  No filling defect is identified in the pulmonary arterial tree to suggest pulmonary embolus.  There is diffuse steatosis of the visualized portion of the liver.  No  pericardial effusion noted.  No aortic dissection is observed.  Airway thickening noted.  In the area of previous concern for right basilar pulmonary nodule, no nodule is seen.  This may have represented a confluence of vascular shadows or a skin fold.  Old, healed right ninth rib fracture noted.  Mild thoracic spondylosis is present.  IMPRESSION:  1.  Pathologic mediastinal and hilar adenopathy in association with airway thickening.  Differential diagnostic considerations include sarcoidosis, atypical pneumonia with prominent reactive adenopathy, or lymphoma. 2. No filling defect is identified in the pulmonary arterial tree to suggest pulmonary embolus. 3.  Steatosis of the visualized portion of the liver. 4.  No pulmonary nodule is observed to correlate with the subtle finding on conventional radiography.  The finding of conventional radiography may have represented a skin fold or superimposed vascularity.  Original Report Authenticated By: Dellia Cloud, M.D.         MDM  History of COPD presenting with 5 days of cough, shortness of breath, fever and chills. No respiratory distress, lungs clear bilaterally.  Patient treated for  atypical pneumonia with Avelox. He ambulated in the ED with no desaturation. The nonspecific lung nodule seen x-ray was not seen on CT scan. However he does have mediastinal hilar adenopathy. Patient is informed of these results this may represent inflammation from his pneumonia the lymphoma and sarcoidosis or other differential. He states he'll followup with Dr. Gerda Diss. Will also refer to Dr. Mariel Sleet.        MEDICATIONS GIVEN IN THE E.D. Scheduled Meds:    . sodium chloride   Intravenous Once  . albuterol  2.5 mg Nebulization Once  . ipratropium  0.5 mg Nebulization Once   Continuous Infusions:    . moxifloxacin 400 mg (03/07/12 1739)       IMPRESSION: No diagnosis found.   NEW MEDICATIONS: New Prescriptions   No medications on file      I personally performed the services described in this documentation, which was scribed in my presence.  The recorded information has been reviewed and considered.       Glynn Octave, MD 03/07/12 1905

## 2012-03-07 NOTE — ED Notes (Signed)
MD at bedside. Examining pt and discussing plan of care.

## 2012-03-07 NOTE — Discharge Instructions (Signed)
Pneumonia, Adult You have enlarged lymph nodes on your chest CT. Follow up with Dr. Gerda Diss regarding this. Take the antibiotics as prescribed. Return to the ED with worsening symptoms. Pneumonia is an infection of the lungs.  CAUSES Pneumonia may be caused by bacteria or a virus. Usually, these infections are caused by breathing infectious particles into the lungs (respiratory tract). SYMPTOMS   Cough.   Fever.   Chest pain.   Increased rate of breathing.   Wheezing.   Mucus production.  DIAGNOSIS  If you have the common symptoms of pneumonia, your caregiver will typically confirm the diagnosis with a chest X-ray. The X-ray will show an abnormality in the lung (pulmonary infiltrate) if you have pneumonia. Other tests of your blood, urine, or sputum may be done to find the specific cause of your pneumonia. Your caregiver may also do tests (blood gases or pulse oximetry) to see how well your lungs are working. TREATMENT  Some forms of pneumonia may be spread to other people when you cough or sneeze. You may be asked to wear a mask before and during your exam. Pneumonia that is caused by bacteria is treated with antibiotic medicine. Pneumonia that is caused by the influenza virus may be treated with an antiviral medicine. Most other viral infections must run their course. These infections will not respond to antibiotics.  PREVENTION A pneumococcal shot (vaccine) is available to prevent a common bacterial cause of pneumonia. This is usually suggested for:  People over 9 years old.   Patients on chemotherapy.   People with chronic lung problems, such as bronchitis or emphysema.   People with immune system problems.  If you are over 65 or have a high risk condition, you may receive the pneumococcal vaccine if you have not received it before. In some countries, a routine influenza vaccine is also recommended. This vaccine can help prevent some cases of pneumonia.You may be offered the  influenza vaccine as part of your care. If you smoke, it is time to quit. You may receive instructions on how to stop smoking. Your caregiver can provide medicines and counseling to help you quit. HOME CARE INSTRUCTIONS   Cough suppressants may be used if you are losing too much rest. However, coughing protects you by clearing your lungs. You should avoid using cough suppressants if you can.   Your caregiver may have prescribed medicine if he or she thinks your pneumonia is caused by a bacteria or influenza. Finish your medicine even if you start to feel better.   Your caregiver may also prescribe an expectorant. This loosens the mucus to be coughed up.   Only take over-the-counter or prescription medicines for pain, discomfort, or fever as directed by your caregiver.   Do not smoke. Smoking is a common cause of bronchitis and can contribute to pneumonia. If you are a smoker and continue to smoke, your cough may last several weeks after your pneumonia has cleared.   A cold steam vaporizer or humidifier in your room or home may help loosen mucus.   Coughing is often worse at night. Sleeping in a semi-upright position in a recliner or using a couple pillows under your head will help with this.   Get rest as you feel it is needed. Your body will usually let you know when you need to rest.  SEEK IMMEDIATE MEDICAL CARE IF:   Your illness becomes worse. This is especially true if you are elderly or weakened from any other disease.  You cannot control your cough with suppressants and are losing sleep.   You begin coughing up blood.   You develop pain which is getting worse or is uncontrolled with medicines.   You have a fever.   Any of the symptoms which initially brought you in for treatment are getting worse rather than better.   You develop shortness of breath or chest pain.  MAKE SURE YOU:   Understand these instructions.   Will watch your condition.   Will get help right away  if you are not doing well or get worse.  Document Released: 11/18/2005 Document Revised: 11/07/2011 Document Reviewed: 02/07/2011 [facility] Patient Information 2012 [place], [place].

## 2012-03-07 NOTE — ED Notes (Signed)
Pt with COPD and cc of sob, wheezing, and cough since tuesday

## 2012-04-10 ENCOUNTER — Encounter: Payer: Self-pay | Admitting: Internal Medicine

## 2012-04-10 ENCOUNTER — Ambulatory Visit (INDEPENDENT_AMBULATORY_CARE_PROVIDER_SITE_OTHER): Payer: BC Managed Care – PPO | Admitting: Internal Medicine

## 2012-04-10 ENCOUNTER — Other Ambulatory Visit: Payer: BC Managed Care – PPO

## 2012-04-10 VITALS — BP 118/72 | HR 104 | Temp 98.2°F | Ht 70.0 in | Wt 278.6 lb

## 2012-04-10 DIAGNOSIS — F17210 Nicotine dependence, cigarettes, uncomplicated: Secondary | ICD-10-CM | POA: Insufficient documentation

## 2012-04-10 DIAGNOSIS — J449 Chronic obstructive pulmonary disease, unspecified: Secondary | ICD-10-CM

## 2012-04-10 DIAGNOSIS — J9859 Other diseases of mediastinum, not elsewhere classified: Secondary | ICD-10-CM

## 2012-04-10 DIAGNOSIS — F17200 Nicotine dependence, unspecified, uncomplicated: Secondary | ICD-10-CM

## 2012-04-10 NOTE — Assessment & Plan Note (Signed)
 #  COPD history and shortness of breath  - have full pft at followup in 2 months  - nurse will walk you for oxygen levels now   #FOllowup  - 2 months with PET scan and full PFT at followup

## 2012-04-10 NOTE — Assessment & Plan Note (Signed)
 #  Smoking  - glad you are working on quitting 

## 2012-04-10 NOTE — Progress Notes (Signed)
Subjective:    Patient ID: Joseph [name], male    DOB: 02/05/1971, 41 y.o.   MRN: [id number]  HPI  IOV  41 year old male.  reports that he has been smoking Cigarettes.  He has a 40 pack-year smoking history; 2 ppd since age 49. He does not have any smokeless tobacco history on file. Body mass index is 39.97 kg/(m^2) (40# weight gain in 4 years) [name], MD, MD is pcp. [facility] has dx of chronic percocet use, OSA on CPAP, Morbid obesity and COPD since early 2002 (has hx of spirometry back then but result NA). Also, hx of pyrethrin inhalation through work in [employer] 1992 - 1999. Currently unemployed. Has had dx of pna in past. Also reports strong hx of breathing issues in family.   In April 2013 reports he had pneumonia apparently. Went to [facility] ER and found some abnormality (nodule based on review of ER notes) on CXR that resulted in CT Angio 03/07/12 same visit. PE ruled out but found to have mediastinal nodes but the cxr nodule was not there. Apparently ER doctor said it was lymphoma and asked to see an oncologist. But primary care doctor felt that was over zealous and referred patient here. He prefers conservative mgmt to nodes if clinical suspicion for cancer is low  Currently, feels baseline which is lack of energy (spent most of days yesterday in bed), esp in pollen, chronic dyspnea on exertion for 180 feet relieved by rest  But slowly progressive since 2002 for which he uses albuterol prn on average 4 times a day.  Walkt test 185 feet x 3 laps: no desaturation  He smokes at baseline; trying quit, prior intolerance (dreams) with chantix.   Only baseline mild chronic cough with associtaed wheeze that is occasional.. Does have some B symptoms like nocturnal diaphoresis (soaks pillows). Denies weight loss.   CT ANGIOGRAPHY CHEST  Comparison: 03/07/2012  An abnormal right lower paratracheal node has a short axis diameter  of 1.6 cm. An abnormal AP window lymph node has a short axis    diameter of 1.4 cm. Mild bilateral hilar and infrahilar adenopathy  noted. Scattered small axillary lymph nodes are present.  A subcarinal node has a short axis diameter of 1.5 cm.   No filling defect is identified in the pulmonary arterial tree to  suggest pulmonary embolus.   There is diffuse steatosis of the visualized portion of the liver.   No pericardial effusion noted. No aortic dissection is observed.   Airway thickening noted. In the area of previous concern for right  basilar pulmonary nodule, no nodule is seen. This may have  represented a confluence of vascular shadows or a skin fold.  Old, healed right ninth rib fracture noted. Mild thoracic  spondylosis is present.  I Original Report Authenticated By: Dellia Cloud, M.D    Past Medical History  Diagnosis Date  . Diabetes mellitus   . COPD (chronic obstructive pulmonary disease)   . Sleep apnea      Family History  Problem Relation Age of Onset  . Emphysema Maternal Grandfather   . Emphysema Maternal Grandmother   . Clotting disorder Maternal Grandmother      History   Social History  . Marital Status: Married    Spouse Name: N/A    Number of Children: N/A  . Years of Education: N/A   Occupational History  . none    Social History Main Topics  . Smoking status: Current  Everyday Smoker -- 2.0 packs/day for 20 years    Types: Cigarettes  . Smokeless tobacco: Not on file  . Alcohol Use: No  . Drug Use: No  . Sexually Active:    Other Topics Concern  . Not on file   Social History Narrative  . No narrative on file     Allergies  Allergen Reactions  . Azithromycin Hives  . Erythromycin Hives  . Metformin Nausea And Vomiting  . Doxycycline Rash     Outpatient Prescriptions Prior to Visit  Medication Sig Dispense Refill  . albuterol (PROVENTIL HFA;VENTOLIN HFA) 108 (90 BASE) MCG/ACT inhaler Inhale 1-2 puffs into the lungs every 6 (six) hours as needed for wheezing.  1 Inhaler  0   . buPROPion (WELLBUTRIN SR) 200 MG 12 hr tablet Take 200 mg by mouth 2 (two) times daily.      Marland Kitchen glyBURIDE (DIABETA) 5 MG tablet Take 2.5-5 mg by mouth 2 (two) times daily. Patient takes 1 tablet in the morning and 1/2 tablet at night      . lansoprazole (PREVACID) 30 MG capsule Take 30 mg by mouth 2 (two) times daily.      Marland Kitchen oxyCODONE-acetaminophen (PERCOCET) 5-325 MG per tablet Take 1 tablet by mouth 3 (three) times daily. pain            Review of Systems  Constitutional: Negative for fever and unexpected weight change.  HENT: Positive for congestion and sneezing. Negative for ear pain, nosebleeds, sore throat, rhinorrhea, trouble swallowing, dental problem, postnasal drip and sinus pressure.   Eyes: Negative for redness and itching.  Respiratory: Positive for cough and shortness of breath. Negative for chest tightness and wheezing.   Cardiovascular: Negative for palpitations and leg swelling.  Gastrointestinal: Negative for nausea and vomiting.  Genitourinary: Negative for dysuria.  Musculoskeletal: Negative for joint swelling.  Skin: Negative for rash.  Neurological: Negative for headaches.  Hematological: Does not bruise/bleed easily.  Psychiatric/Behavioral: Negative for dysphoric mood. The patient is not nervous/anxious.        Objective:   Physical Exam  Nursing note and vitals reviewed. Constitutional: He is oriented to person, place, and time. He appears well-developed and well-nourished. No distress.       Body mass index is 39.97 kg/(m^2).   HENT:  Head: Normocephalic and atraumatic.  Right Ear: External ear normal.  Left Ear: External ear normal.  Mouth/Throat: Oropharynx is clear and moist. No oropharyngeal exudate.  Eyes: Conjunctivae and EOM are normal. Pupils are equal, round, and reactive to light. Right eye exhibits no discharge. Left eye exhibits no discharge. No scleral icterus.  Neck: Normal range of motion. Neck supple. No JVD present. No tracheal  deviation present. No thyromegaly present.  Cardiovascular: Normal rate, regular rhythm and intact distal pulses.  Exam reveals no gallop and no friction rub.   No murmur heard. Pulmonary/Chest: Effort normal and breath sounds normal. No respiratory distress. He has no wheezes. He has no rales. He exhibits no tenderness.  Abdominal: Soft. Bowel sounds are normal. He exhibits no distension and no mass. There is no tenderness. There is no rebound and no guarding.  Musculoskeletal: Normal range of motion. He exhibits no edema and no tenderness.  Lymphadenopathy:    He has no cervical adenopathy.  Neurological: He is alert and oriented to person, place, and time. He has normal reflexes. No cranial nerve deficit. Coordination normal.  Skin: Skin is warm and dry. No rash noted. He is not diaphoretic. No erythema. No  pallor.  Psychiatric: He has a normal mood and affect. His behavior is normal. Judgment and thought content normal.          Assessment & Plan:

## 2012-04-10 NOTE — Patient Instructions (Signed)
#  MEdiastinal node  - suspicion for cancer is low  - please do blood test for ACE level to help give [place] some clue if this could be due to sarcoid or not  - Please have PET scan in 2 months for followup of the mediastinal nodes  #Smoking  - glad you are working on quitting  #COPD history and shortness of breath  - have full pft at followup in 2 months  - nurse will walk you for oxygen levels now   #FOllowup  - 2 months with PET scan and full PFT at followup

## 2012-04-10 NOTE — Assessment & Plan Note (Signed)
#  MEdiastinal node  - suspicion for cancer is low  - please do blood test for ACE level to help give [place] some clue if this could be due to sarcoid or not  - Please have PET scan in 2 months for followup of the mediastinal nodes

## 2012-06-02 ENCOUNTER — Ambulatory Visit (INDEPENDENT_AMBULATORY_CARE_PROVIDER_SITE_OTHER): Payer: BC Managed Care – PPO | Admitting: Internal Medicine

## 2012-06-02 DIAGNOSIS — J9859 Other diseases of mediastinum, not elsewhere classified: Secondary | ICD-10-CM

## 2012-06-02 DIAGNOSIS — J449 Chronic obstructive pulmonary disease, unspecified: Secondary | ICD-10-CM

## 2012-06-02 DIAGNOSIS — F17200 Nicotine dependence, unspecified, uncomplicated: Secondary | ICD-10-CM

## 2012-06-02 LAB — PULMONARY FUNCTION TEST

## 2012-06-02 NOTE — Progress Notes (Signed)
PFT done today. 

## 2012-06-02 NOTE — Progress Notes (Signed)
Subjective:    Patient ID: Joseph [name], male    DOB: [birth year], 41 y.o.   MRN: [id number]  HPI IOV  41 year old male.  reports that he has been smoking Cigarettes.  He has a 40 pack-year smoking history; 2 ppd since age 6. He does not have any smokeless tobacco history on file. Body mass index is 39.97 kg/(m^2) (40# weight gain in 4 years) Joseph Asa, MD, MD is pcp. [facility] has dx of chronic percocet use, OSA on CPAP, Morbid obesity and COPD since early 2002 (has hx of spirometry back then but result NA). Also, hx of pyrethrin inhalation through work in [employer] 1992 - 1999. Currently unemployed. Has had dx of pna in past. Also reports strong hx of breathing issues in family.   In April 2013 reports he had pneumonia apparently. Went to [facility] ER and found some abnormality (nodule based on review of ER notes) on CXR that resulted in CT Angio 03/07/12 same visit. PE ruled out but found to have mediastinal nodes but the cxr nodule was not there. Apparently ER doctor said it was lymphoma and asked to see an oncologist. But primary care doctor felt that was over zealous and referred patient here. He prefers conservative mgmt to nodes if clinical suspicion for cancer is low  Currently, feels baseline which is lack of energy (spent most of days yesterday in bed), esp in pollen, chronic dyspnea on exertion for 180 feet relieved by rest  But slowly progressive since 2002 for which he uses albuterol prn on average 4 times a day.  Walkt test 185 feet x 3 laps: no desaturation  He smokes at baseline; trying quit, prior intolerance (dreams) with chantix.   Only baseline mild chronic cough with associtaed wheeze that is occasional.. Does have some B symptoms like nocturnal diaphoresis (soaks pillows). Denies weight loss.   CT ANGIOGRAPHY CHEST  Comparison: 03/07/2012  An abnormal right lower paratracheal node has a short axis diameter  of 1.6 cm. An abnormal AP window lymph node has a short axis    diameter of 1.4 cm. Mild bilateral hilar and infrahilar adenopathy  noted. Scattered small axillary lymph nodes are present.  A subcarinal node has a short axis diameter of 1.5 cm.   No filling defect is identified in the pulmonary arterial tree to  suggest pulmonary embolus.   There is diffuse steatosis of the visualized portion of the liver.   No pericardial effusion noted. No aortic dissection is observed.   Airway thickening noted. In the area of previous concern for right  basilar pulmonary nodule, no nodule is seen. This may have  represented a confluence of vascular shadows or a skin fold.  Old, healed right ninth rib fracture noted. Mild thoracic  spondylosis is present.  I Original Report Authenticated By: Dellia Cloud, M.D   #MEdiastinal node  - suspicion for cancer is low  - please do blood test for ACE level to help give [place] some clue if this could be due to sarcoid or not  - Please have PET scan in 2 months for followup of the mediastinal nodes  #Smoking  - glad you are working on quitting  #COPD history and shortness of breath  - have full pft at followup in 2 months  - nurse will walk you for oxygen levels now  #FOllowup  - 2 months with PET scan and full PFT at followup  OV 06/02/2012   ACE level 04/10/12  -  is in 66s and normal   - Had PFts today . PET scan schedule 06/10/12. HE had PFTs and went home; did not realize he had OV today. So, not seen  PFts show mixed picture - fvc 3.4L/69%, fev1 2.9/76%, Ratio 85 but 12% bd respose. TLC 5.7/83%. DLCO 23.7/69%  Review of Systems  Constitutional: Negative for fever and unexpected weight change.  HENT: Negative for ear pain, nosebleeds, congestion, sore throat, rhinorrhea, sneezing, trouble swallowing, dental problem, postnasal drip and sinus pressure.   Eyes: Negative for redness and itching.  Respiratory: Negative for cough, chest tightness, shortness of breath and wheezing.   Cardiovascular: Negative  for palpitations and leg swelling.  Gastrointestinal: Negative for nausea and vomiting.  Genitourinary: Negative for dysuria.  Musculoskeletal: Negative for joint swelling.  Skin: Negative for rash.  Neurological: Negative for headaches.  Hematological: Does not bruise/bleed easily.  Psychiatric/Behavioral: Negative for dysphoric mood. The patient is not nervous/anxious.        Objective:   Physical Exam Nursing note and vitals reviewed. Constitutional: He is oriented to person, place, and time. He appears well-developed and well-nourished. No distress.       Body mass index is 39.97 kg/(m^2).   HENT:  Head: Normocephalic and atraumatic.  Right Ear: External ear normal.  Left Ear: External ear normal.  Mouth/Throat: Oropharynx is clear and moist. No oropharyngeal exudate.  Eyes: Conjunctivae and EOM are normal. Pupils are equal, round, and reactive to light. Right eye exhibits no discharge. Left eye exhibits no discharge. No scleral icterus.  Neck: Normal range of motion. Neck supple. No JVD present. No tracheal deviation present. No thyromegaly present.  Cardiovascular: Normal rate, regular rhythm and intact distal pulses.  Exam reveals no gallop and no friction rub.   No murmur heard. Pulmonary/Chest: Effort normal and breath sounds normal. No respiratory distress. He has no wheezes. He has no rales. He exhibits no tenderness.  Abdominal: Soft. Bowel sounds are normal. He exhibits no distension and no mass. There is no tenderness. There is no rebound and no guarding.  Musculoskeletal: Normal range of motion. He exhibits no edema and no tenderness.  Lymphadenopathy:    He has no cervical adenopathy.  Neurological: He is alert and oriented to person, place, and time. He has normal reflexes. No cranial nerve deficit. Coordination normal.  Skin: Skin is warm and dry. No rash noted. He is not diaphoretic. No erythema. No pallor.  Psychiatric: He has a normal mood and affect. His  behavior is normal. Judgment and thought content normal.         Assessment & Plan:

## 2012-06-10 ENCOUNTER — Encounter (HOSPITAL_COMMUNITY)
Admission: RE | Admit: 2012-06-10 | Discharge: 2012-06-10 | Disposition: A | Discharge status: Home / Self Care | Payer: BC Managed Care – PPO | Source: Ambulatory Visit | Attending: Internal Medicine | Admitting: Internal Medicine

## 2012-06-10 ENCOUNTER — Encounter (HOSPITAL_COMMUNITY): Payer: Self-pay

## 2012-06-10 DIAGNOSIS — J9859 Other diseases of mediastinum, not elsewhere classified: Secondary | ICD-10-CM

## 2012-06-10 DIAGNOSIS — R599 Enlarged lymph nodes, unspecified: Secondary | ICD-10-CM | POA: Insufficient documentation

## 2012-06-10 LAB — GLUCOSE, CAPILLARY: Glucose-Capillary: 139 mg/dL — ABNORMAL HIGH (ref 70–99)

## 2012-06-10 MED ORDER — FLUDEOXYGLUCOSE F - 18 (FDG) INJECTION
18.3000 | Freq: Once | INTRAVENOUS | Status: AC | PRN
  Administered 2012-06-10: 18.3 via INTRAVENOUS

## 2012-06-11 ENCOUNTER — Telehealth: Payer: Self-pay | Admitting: Internal Medicine

## 2012-06-11 NOTE — Telephone Encounter (Signed)
Pt advised and ov set for 08-14-12 at 10:45. Carron Curie, CMA

## 2012-06-11 NOTE — Telephone Encounter (Signed)
PET scan 06/10/12 shows very low uptake in the mediastinal nodes. Suspicion for cancer is very low. Please convey results and give him first avail appt to see me to discuss breathing test results. He misssed appt 06/02/12

## 2012-08-14 ENCOUNTER — Ambulatory Visit (INDEPENDENT_AMBULATORY_CARE_PROVIDER_SITE_OTHER): Payer: BC Managed Care – PPO | Admitting: Internal Medicine

## 2012-08-14 ENCOUNTER — Encounter: Payer: Self-pay | Admitting: Internal Medicine

## 2012-08-14 ENCOUNTER — Telehealth: Payer: Self-pay | Admitting: *Deleted

## 2012-08-14 VITALS — BP 132/96 | HR 94 | Temp 99.2°F | Ht 70.0 in | Wt 281.2 lb

## 2012-08-14 DIAGNOSIS — R0600 Dyspnea, unspecified: Secondary | ICD-10-CM | POA: Insufficient documentation

## 2012-08-14 DIAGNOSIS — J9859 Other diseases of mediastinum, not elsewhere classified: Secondary | ICD-10-CM

## 2012-08-14 DIAGNOSIS — F17200 Nicotine dependence, unspecified, uncomplicated: Secondary | ICD-10-CM

## 2012-08-14 DIAGNOSIS — R0609 Other forms of dyspnea: Secondary | ICD-10-CM

## 2012-08-14 MED ORDER — BECLOMETHASONE DIPROPIONATE 80 MCG/ACT IN AERS: 2.0000 | INHALATION_SPRAY | Freq: Two times a day (BID) | RESPIRATORY_TRACT | Status: DC

## 2012-08-14 MED ORDER — BECLOMETHASONE DIPROPIONATE 80 MCG/ACT IN AERS: 1.0000 | INHALATION_SPRAY | Freq: Two times a day (BID) | RESPIRATORY_TRACT | Status: DC

## 2012-08-14 NOTE — Telephone Encounter (Signed)
Patient was seen today in the office and was supposed to be given a Low Glycemic Diet to follow at home and he left before getting it. The Diet has been mailed to the patient and message has been left for him to return our call. I put a note in the envelope explaining how to follow the diet plan.

## 2012-08-14 NOTE — Progress Notes (Signed)
Subjective:    Patient ID: Joseph [name], male    DOB: [birth year], 41 y.o.   MRN: [id number]  HPI  IOV 04/10/12  41 year old male.  reports that he has been smoking Cigarettes.  He has a 40 pack-year smoking history; 2 ppd since age 18. He does not have any smokeless tobacco history on file. Body mass index is 39.97 kg/(m^2) (40# weight gain in 4 years) [name], MD, MD is pcp. [facility] has dx of chronic percocet use, OSA on CPAP, Morbid obesity and COPD since early 2002 (has hx of spirometry back then but result NA). Also, hx of pyrethrin inhalation through work in [employer] 1992 - 1999. Currently unemployed. Has had dx of pna in past. Also reports strong hx of breathing issues in family.   In April 2013 reports he had pneumonia apparently. Went to [facility] ER and found some abnormality (nodule based on review of ER notes) on CXR that resulted in CT Angio 03/07/12 same visit. PE ruled out but found to have mediastinal nodes but the cxr nodule was not there. Apparently ER doctor said it was lymphoma and asked to see an oncologist. But primary care doctor felt that was over zealous and referred patient here. He prefers conservative mgmt to nodes if clinical suspicion for cancer is low  Currently, feels baseline which is lack of energy (spent most of days yesterday in bed), esp in pollen, chronic dyspnea on exertion for 180 feet relieved by rest  But slowly progressive since 2002 for which he uses albuterol prn on average 4 times a day.  Walkt test 185 feet x 3 laps: no desaturation  He smokes at baseline; trying quit, prior intolerance (dreams) with chantix.   Only baseline mild chronic cough with associtaed wheeze that is occasional.. Does have some B symptoms like nocturnal diaphoresis (soaks pillows). Denies weight loss.   CT ANGIOGRAPHY CHEST  Comparison: 03/07/2012  An abnormal right lower paratracheal node has a short axis diameter  of 1.6 cm. An abnormal AP window lymph node has a short  axis  diameter of 1.4 cm. Mild bilateral hilar and infrahilar adenopathy  noted. Scattered small axillary lymph nodes are present.  A subcarinal node has a short axis diameter of 1.5 cm.   No filling defect is identified in the pulmonary arterial tree to  suggest pulmonary embolus.   There is diffuse steatosis of the visualized portion of the liver.   No pericardial effusion noted. No aortic dissection is observed.   Airway thickening noted. In the area of previous concern for right  basilar pulmonary nodule, no nodule is seen. This may have  represented a confluence of vascular shadows or a skin fold.  Old, healed right ninth rib fracture noted. Mild thoracic  spondylosis is present.  I Original Report Authenticated By: Dellia Cloud, M.D   #MEdiastinal node  - suspicion for cancer is low  - please do blood test for ACE level to help give [place] some clue if this could be due to sarcoid or not  - Please have PET scan in 2 months for followup of the mediastinal nodes  #Smoking  - glad you are working on quitting  #COPD history and shortness of breath  - have full pft at followup in 2 months  - nurse will walk you for oxygen levels now  #FOllowup  - 2 months with PET scan and full PFT at followup    OV 08/14/2012   Mediastinal nodes:  PET scan 06/10/12 shows very low uptake in the mediastinal nodes. Suspicion for cancer is very low. Lymph nodes include 13 mm right lower paratracheal lymph node and 10 mm prevascular lymph node. ACE level 04/10/12  - is in 20s and normal    Smoking: switched to e-cigs with  Nicotine to help quit. Now 50% e-cigs and 1 pack regular cigs which is 50% down   Still dyspneic: 1 flught of steps and better with rest and with albuterol and atrovent prn. Dyspne also worse in winter and cooler weathers. Rates it as moderate. STable since last visit to slightly worse. thre is some associtaed cough. No asociated weight loss. COugh is moderate and worst  early in morning and in cooler temperatues.. Denies sinus drainage. PFts show mixed picture - fvc 3.4L/69%, fev1 2.9/76%, Ratio 85 but 12% bd respose. TLC 5.7/83%. DLCO 23.7/69%    CAT COPD Symptom & Quality of Life Score (GSK trademark) 0 is no burden. 5 is highest burden 08/14/2012   Never Cough -> Cough all the time 3  No phlegm in chest -> Chest is full of phlegm 2  No chest tightness -> Chest feels very tight 4  No dyspnea for 1 flight stairs/hill -> Very dyspneic for 1 flight of stairs 4  No limitations for ADL at home -> Very limited with ADL at home 3  Confident leaving home -> Not at all confident leaving home 3  Sleep soundly -> Do not sleep soundly because of lung condition 4  Lots of Energy -> No energy at all 4  TOTAL Score (max 40)  27    Past, Family, Social reviewed: unemployment continues. No hiring due to percocet. Does not want to apply for disabiluty. Has DM now and is on glyburide  Review of Systems  Constitutional: Negative for chills, diaphoresis, activity change, appetite change, fatigue and unexpected weight change. Fever: low grade today  99.2.  HENT: Negative for hearing loss, ear pain, nosebleeds, congestion, sore throat, facial swelling, rhinorrhea, sneezing, mouth sores, trouble swallowing, neck pain, neck stiffness, dental problem, voice change, postnasal drip, sinus pressure, tinnitus and ear discharge.   Eyes: Negative for photophobia, discharge, itching and visual disturbance.  Respiratory: Positive for cough, chest tightness, shortness of breath and wheezing. Negative for apnea, choking and stridor.        Productive cough  Cardiovascular: Negative for chest pain, palpitations and leg swelling.  Gastrointestinal: Negative for nausea, vomiting, abdominal pain, constipation, blood in stool and abdominal distention.  Genitourinary: Negative for dysuria, urgency, frequency, hematuria, flank pain, decreased urine volume and difficulty urinating.    Musculoskeletal: Negative for myalgias, back pain, joint swelling, arthralgias and gait problem.  Skin: Negative for color change, pallor and rash.  Neurological: Positive for headaches. Negative for dizziness, tremors, seizures, syncope, speech difficulty, weakness, light-headedness and numbness.  Hematological: Negative for adenopathy. Does not bruise/bleed easily.  Psychiatric/Behavioral: Negative for confusion, disturbed wake/sleep cycle and agitation. The patient is not nervous/anxious.        Objective:   Physical Exam  Nursing note and vitals reviewed. Constitutional: He is oriented to person, place, and time. He appears well-developed and well-nourished. No distress.   Body mass index is 40.35 kg/(m^2).    HENT:  Head: Normocephalic and atraumatic.  Right Ear: External ear normal.  Left Ear: External ear normal.  Mouth/Throat: Oropharynx is clear and moist. No oropharyngeal exudate.  Eyes: Conjunctivae and EOM are normal. Pupils are equal, round, and reactive to light. Right eye exhibits  no discharge. Left eye exhibits no discharge. No scleral icterus.  Neck: Normal range of motion. Neck supple. No JVD present. No tracheal deviation present. No thyromegaly present.  Cardiovascular: Normal rate, regular rhythm and intact distal pulses.  Exam reveals no gallop and no friction rub.   No murmur heard. Pulmonary/Chest: Effort normal and breath sounds normal. No respiratory distress. He has no wheezes. He has no rales. He exhibits no tenderness.  Abdominal: Soft. Bowel sounds are normal. He exhibits no distension and no mass. There is no tenderness. There is no rebound and no guarding.  Musculoskeletal: Normal range of motion. He exhibits no edema and no tenderness.  Lymphadenopathy:    He has no cervical adenopathy.  Neurological: He is alert and oriented to person, place, and time. He has normal reflexes. No cranial nerve deficit. Coordination normal.  Skin: Skin is warm and dry.  No rash noted. He is not diaphoretic. No erythema. No pallor.  Psychiatric: He has a normal mood and affect. His behavior is normal. Judgment and thought content normal.           Assessment & Plan:

## 2012-08-14 NOTE — Patient Instructions (Addendum)
#  MEdiastinal node  - suspicion for cancer is low  - you will need CT chest in a year; we will schedule it later  #Smoking  - glad you are working on quitting  #Shortness of breath - this is due to asthma/copd and weight  - please start QVAR 2 puff twice daily - take sample and show technique and take prescription too  - focus on weight loss through the low glycemic diet; take diet sheet from [place]  #FOllowup  - 3 months with spirometry at followup  - flu shot and pneumovax through PMD as discussed

## 2012-08-14 NOTE — Assessment & Plan Note (Signed)
 #  Shortness of breath - this is due to asthma/copd and weight  - please start QVAR 2 puff twice daily - take sample and show technique and take prescription too  - focus on weight loss through the low glycemic diet; take diet sheet from [place]  #FOllowup  - 3 months with spirometry at followup  - flu shot and pneumovax through PMD as discussed

## 2012-08-14 NOTE — Assessment & Plan Note (Signed)
 #  Smoking  - glad you are working on quitting

## 2012-08-14 NOTE — Assessment & Plan Note (Signed)
#  MEdiastinal node  - suspicion for cancer is low  - you will need CT chest in a year; we will schedule it later

## 2012-11-09 ENCOUNTER — Telehealth: Payer: Self-pay | Admitting: Internal Medicine

## 2012-11-09 NOTE — Telephone Encounter (Signed)
Left Message 3x to schd follow up apt. Sent letter 11/09/12. °

## 2012-12-02 HISTORY — PX: VASECTOMY: SHX75

## 2013-01-19 ENCOUNTER — Encounter (HOSPITAL_COMMUNITY): Payer: Self-pay

## 2013-01-19 ENCOUNTER — Emergency Department (HOSPITAL_COMMUNITY)
Admission: EM | Admit: 2013-01-19 | Discharge: 2013-01-19 | Disposition: A | Discharge status: Home / Self Care | Payer: BC Managed Care – PPO | Attending: Emergency Medicine | Admitting: Emergency Medicine

## 2013-01-19 ENCOUNTER — Emergency Department (HOSPITAL_COMMUNITY): Payer: BC Managed Care – PPO

## 2013-01-19 DIAGNOSIS — F17200 Nicotine dependence, unspecified, uncomplicated: Secondary | ICD-10-CM | POA: Insufficient documentation

## 2013-01-19 DIAGNOSIS — Z8669 Personal history of other diseases of the nervous system and sense organs: Secondary | ICD-10-CM | POA: Insufficient documentation

## 2013-01-19 DIAGNOSIS — J40 Bronchitis, not specified as acute or chronic: Secondary | ICD-10-CM

## 2013-01-19 DIAGNOSIS — E119 Type 2 diabetes mellitus without complications: Secondary | ICD-10-CM | POA: Insufficient documentation

## 2013-01-19 DIAGNOSIS — J209 Acute bronchitis, unspecified: Secondary | ICD-10-CM | POA: Insufficient documentation

## 2013-01-19 DIAGNOSIS — J3489 Other specified disorders of nose and nasal sinuses: Secondary | ICD-10-CM | POA: Insufficient documentation

## 2013-01-19 DIAGNOSIS — Z79899 Other long term (current) drug therapy: Secondary | ICD-10-CM | POA: Insufficient documentation

## 2013-01-19 DIAGNOSIS — J440 Chronic obstructive pulmonary disease with acute lower respiratory infection: Secondary | ICD-10-CM | POA: Insufficient documentation

## 2013-01-19 MED ORDER — BENZONATATE 100 MG PO CAPS: 100.0000 mg | ORAL_CAPSULE | Freq: Three times a day (TID) | ORAL | Status: DC

## 2013-01-19 MED ORDER — CEPHALEXIN 250 MG PO CAPS: 500.0000 mg | ORAL_CAPSULE | Freq: Four times a day (QID) | ORAL | Status: DC

## 2013-01-19 MED ORDER — PREDNISONE 20 MG PO TABS: ORAL_TABLET | ORAL | Status: DC

## 2013-01-19 NOTE — ED Provider Notes (Signed)
History  This chart was scribed for Donnetta Hutching, MD by Ardeen Jourdain, ED Scribe. This patient was seen in room APA18/APA18 and the patient's care was started at 1111.  CSN: [id number]  Arrival date & time 01/19/13  1051   First MD Initiated Contact with Patient 01/19/13 1111      Chief Complaint  Patient presents with  . Cough  . Nasal Congestion     The history is provided by the patient. No language interpreter was used.    Joseph [name] is a 42 y.o. male with a h/o COPD and DM who presents to the Emergency Department complaining of cough with associated congestion that began 2 weeks ago and has been gradually worsening. He states the cough has become so severe he feels as if he has strained the muscles in his chest. He reports a h/o similar conditions and states this feels similar. He reports taking Augmentin with no relief. He reports he was prescribed Qvar as an emergency inhaler with slight relief.  Past Medical History  Diagnosis Date  . Diabetes mellitus   . COPD (chronic obstructive pulmonary disease)   . Sleep apnea     Past Surgical History  Procedure Laterality Date  . Carpal tunnel release    . Shoulder arthrocentesis    . Hernia repair      Family History  Problem Relation Age of Onset  . Emphysema Maternal Grandfather   . Emphysema Maternal Grandmother   . Clotting disorder Maternal Grandmother     History  Substance Use Topics  . Smoking status: Current Every Day Smoker -- 2.00 packs/day for 20 years    Types: Cigarettes  . Smokeless tobacco: Not on file  . Alcohol Use: No      Review of Systems  All other systems reviewed and are negative.  A complete 10 system review of systems was obtained and all systems are negative except as noted in the HPI and PMH.    Allergies  Azithromycin; Erythromycin; Metformin; and Doxycycline  Home Medications   Current Outpatient Rx  Name  Route  Sig  Dispense  Refill  . albuterol (PROVENTIL HFA;VENTOLIN  HFA) 108 (90 BASE) MCG/ACT inhaler   Inhalation   Inhale 1-2 puffs into the lungs every 6 (six) hours as needed for wheezing.   1 Inhaler   0   . albuterol (PROVENTIL) (2.5 MG/3ML) 0.083% nebulizer solution   Nebulization   Take 2.5 mg by nebulization 4 (four) times daily.         Marland Kitchen ALPRAZolam (XANAX) 0.5 MG tablet   Oral   Take 0.5 mg by mouth 2 (two) times daily.         . beclomethasone (QVAR) 80 MCG/ACT inhaler   Inhalation   Inhale 2 puffs into the lungs 2 (two) times daily.   1 Inhaler   6   . beclomethasone (QVAR) 80 MCG/ACT inhaler   Inhalation   Inhale 1 puff into the lungs 2 (two) times daily.   1 Inhaler   6   . benzonatate (TESSALON) 100 MG capsule   Oral   Take 100 mg by mouth 3 (three) times daily as needed.         Marland Kitchen buPROPion (WELLBUTRIN SR) 200 MG 12 hr tablet   Oral   Take 200 mg by mouth 2 (two) times daily.         Marland Kitchen glyBURIDE (DIABETA) 5 MG tablet   Oral   Take 2.5-5 mg by mouth  2 (two) times daily. Patient takes 1 tablet in the morning and 1/2 tablet at night         . ipratropium (ATROVENT) 0.02 % nebulizer solution   Nebulization   Take 500 mcg by nebulization 4 (four) times daily.         . lansoprazole (PREVACID) 30 MG capsule   Oral   Take 30 mg by mouth 2 (two) times daily.         Marland Kitchen oxyCODONE-acetaminophen (PERCOCET) 5-325 MG per tablet   Oral   Take 1 tablet by mouth 3 (three) times daily. pain           Triage Vitals: BP 128/91  Pulse 94  Temp(Src) 98.7 F (37.1 C) (Oral)  Resp 20  Ht 5\' 10"  (1.778 m)  Wt 275 lb (124.739 kg)  BMI 39.46 kg/m2  SpO2 96%  Physical Exam  Nursing note and vitals reviewed. Constitutional: He is oriented to person, place, and time. He appears well-developed and well-nourished.  HENT:  Head: Normocephalic and atraumatic.  Eyes: Conjunctivae and EOM are normal. Pupils are equal, round, and reactive to light.  Neck: Normal range of motion. Neck supple.  Cardiovascular: Normal  rate, regular rhythm and normal heart sounds.   Pulmonary/Chest: Effort normal and breath sounds normal. No respiratory distress. He has no wheezes. He has no rales. He exhibits no tenderness.  Abdominal: Soft. Bowel sounds are normal.  Musculoskeletal: Normal range of motion.  Neurological: He is alert and oriented to person, place, and time.  Skin: Skin is warm and dry.  Psychiatric: He has a normal mood and affect. His behavior is normal.    ED Course  Procedures (including critical care time)  DIAGNOSTIC STUDIES: Oxygen Saturation is 96% on room air, adequate by my interpretation.    COORDINATION OF CARE:  11:39 AM: Discussed treatment plan which includes cephalexin, prednisone and tessalon perles  with pt at bedside and pt agreed to plan.     Labs Reviewed - No data to display No results found.   No diagnosis found.    MDM  Patient has chronic bronchitis and COPD. No chest x-ray necessary. Rx cephalexin, prednisone, Tessalon Perles.  Patient states that cephalexin has worked in the past.      I personally performed the services described in this documentation, which was scribed in my presence. The recorded information has been reviewed and is accurate.    Donnetta Hutching, MD 01/19/13 1213

## 2013-01-19 NOTE — ED Notes (Signed)
Pt reports being sick for 2 weeks, w/ cough and congestion, started taking old script for augmentin last week and still not getting any better.

## 2013-01-24 ENCOUNTER — Encounter (HOSPITAL_COMMUNITY): Payer: Self-pay | Admitting: Emergency Medicine

## 2013-01-24 ENCOUNTER — Emergency Department (HOSPITAL_COMMUNITY): Payer: BC Managed Care – PPO

## 2013-01-24 ENCOUNTER — Emergency Department (HOSPITAL_COMMUNITY)
Admission: EM | Admit: 2013-01-24 | Discharge: 2013-01-24 | Disposition: A | Discharge status: Home / Self Care | Payer: BC Managed Care – PPO | Attending: Emergency Medicine | Admitting: Emergency Medicine

## 2013-01-24 DIAGNOSIS — F17200 Nicotine dependence, unspecified, uncomplicated: Secondary | ICD-10-CM | POA: Insufficient documentation

## 2013-01-24 DIAGNOSIS — Z79899 Other long term (current) drug therapy: Secondary | ICD-10-CM | POA: Insufficient documentation

## 2013-01-24 DIAGNOSIS — J4489 Other specified chronic obstructive pulmonary disease: Secondary | ICD-10-CM | POA: Insufficient documentation

## 2013-01-24 DIAGNOSIS — K5289 Other specified noninfective gastroenteritis and colitis: Secondary | ICD-10-CM | POA: Insufficient documentation

## 2013-01-24 DIAGNOSIS — T361X5A Adverse effect of cephalosporins and other beta-lactam antibiotics, initial encounter: Secondary | ICD-10-CM | POA: Insufficient documentation

## 2013-01-24 DIAGNOSIS — R197 Diarrhea, unspecified: Secondary | ICD-10-CM | POA: Insufficient documentation

## 2013-01-24 DIAGNOSIS — J449 Chronic obstructive pulmonary disease, unspecified: Secondary | ICD-10-CM | POA: Insufficient documentation

## 2013-01-24 DIAGNOSIS — Z7982 Long term (current) use of aspirin: Secondary | ICD-10-CM | POA: Insufficient documentation

## 2013-01-24 DIAGNOSIS — K529 Noninfective gastroenteritis and colitis, unspecified: Secondary | ICD-10-CM

## 2013-01-24 DIAGNOSIS — E119 Type 2 diabetes mellitus without complications: Secondary | ICD-10-CM | POA: Insufficient documentation

## 2013-01-24 DIAGNOSIS — J45909 Unspecified asthma, uncomplicated: Secondary | ICD-10-CM | POA: Insufficient documentation

## 2013-01-24 HISTORY — DX: Unspecified asthma, uncomplicated: J45.909

## 2013-01-24 LAB — COMPREHENSIVE METABOLIC PANEL
ALT: 47 U/L (ref 0–53)
AST: 22 U/L (ref 0–37)
Calcium: 9.4 mg/dL (ref 8.4–10.5)
GFR calc Af Amer: >90 mL/min (ref >90)
Sodium: 134 mEq/L — ABNORMAL LOW (ref 135–145)
Total Protein: 7.5 g/dL (ref 6.0–8.3)

## 2013-01-24 LAB — URINE MICROSCOPIC-ADD ON

## 2013-01-24 LAB — URINALYSIS, ROUTINE W REFLEX MICROSCOPIC
Glucose, UA: >1000 mg/dL — AB
Leukocytes, UA: NEGATIVE
Nitrite: NEGATIVE
Protein, ur: NEGATIVE mg/dL

## 2013-01-24 LAB — CBC WITH DIFFERENTIAL/PLATELET
Basophils Absolute: 0.1 10*3/uL (ref 0.0–0.1)
Eosinophils Absolute: 0.2 10*3/uL (ref 0.0–0.7)
Eosinophils Relative: 2 % (ref 0–5)
MCH: 30.6 pg (ref 26.0–34.0)
MCV: 88.2 fL (ref 78.0–100.0)
Platelets: 221 10*3/uL (ref 150–400)
RDW: 13.2 % (ref 11.5–15.5)

## 2013-01-24 MED ORDER — IOHEXOL 300 MG/ML  SOLN
50.0000 mL | Freq: Once | INTRAMUSCULAR | Status: AC | PRN
  Administered 2013-01-24: 50 mL via ORAL

## 2013-01-24 MED ORDER — METRONIDAZOLE 500 MG PO TABS
500.0000 mg | ORAL_TABLET | Freq: Three times a day (TID) | ORAL | Status: DC
  Administered 2013-01-24: 500 mg via ORAL
  Filled 2013-01-24: qty 1

## 2013-01-24 MED ORDER — METRONIDAZOLE 500 MG PO TABS: 500.0000 mg | ORAL_TABLET | Freq: Three times a day (TID) | ORAL | Status: DC

## 2013-01-24 MED ORDER — IOHEXOL 300 MG/ML  SOLN
100.0000 mL | Freq: Once | INTRAMUSCULAR | Status: AC | PRN
  Administered 2013-01-24: 100 mL via INTRAVENOUS

## 2013-01-24 MED ORDER — ONDANSETRON HCL 4 MG PO TABS: 4.0000 mg | ORAL_TABLET | Freq: Four times a day (QID) | ORAL | Status: DC

## 2013-01-24 NOTE — ED Notes (Signed)
Pt returned from xray

## 2013-01-24 NOTE — ED Provider Notes (Signed)
Lites were History    This chart was scribed for Glynn Octave, MD by Charolett Bumpers, ED Scribe. The patient was seen in room APA03/APA03. Patient's care was started at 1313.   CSN: [id number]  Arrival date & time 01/24/13  1258   First MD Initiated Contact with Patient 01/24/13 1313      Chief Complaint  Patient presents with  . Abdominal Pain  . Medication Reaction    The history is provided by the patient. No language interpreter was used.   Joseph [name] is a 42 y.o. male who presents to the Emergency Department complaining of intermittent, severe abdominal cramping with associated diarrhea for the past 5 days. He states that he was originally started on augmentin for a week with no relief for bronchitis. He was seen here on 5 days ago and started on 4000 mg Keflex daily. He states that the abdominal cramping and diarrhea started 5 days ago and called the pharmacy 2 days ago who lowered his dose to 1000 mg daily. He reports diarrhea with every stool, 2-3 times daily. He took a Microbiologist with some relief of the cramping. He denies any vomiting, fevers, dysuria, testicle pain. He states that he has been eating and drinking normally. He states that the bronchitis has improved.   Past Medical History  Diagnosis Date  . Diabetes mellitus   . COPD (chronic obstructive pulmonary disease)   . Sleep apnea   . Asthma     Past Surgical History  Procedure Laterality Date  . Carpal tunnel release    . Shoulder arthrocentesis    . Hernia repair      Family History  Problem Relation Age of Onset  . Emphysema Maternal Grandfather   . Emphysema Maternal Grandmother   . Clotting disorder Maternal Grandmother     History  Substance Use Topics  . Smoking status: Current Every Day Smoker -- 2.00 packs/day for 20 years    Types: Cigarettes  . Smokeless tobacco: Not on file  . Alcohol Use: No      Review of Systems A complete 10 system review of systems was obtained and all  systems are negative except as noted in the HPI and PMH.   Allergies  Azithromycin; Erythromycin; Metformin; and Doxycycline  Home Medications   Current Outpatient Rx  Name  Route  Sig  Dispense  Refill  . albuterol (PROVENTIL HFA;VENTOLIN HFA) 108 (90 BASE) MCG/ACT inhaler   Inhalation   Inhale 1-2 puffs into the lungs every 6 (six) hours as needed for wheezing.   1 Inhaler   0   . albuterol (PROVENTIL) (2.5 MG/3ML) 0.083% nebulizer solution   Nebulization   Take 2.5 mg by nebulization 4 (four) times daily.         Marland Kitchen ALPRAZolam (XANAX) 0.5 MG tablet   Oral   Take 0.5 mg by mouth 2 (two) times daily.         Marland Kitchen aspirin 325 MG EC tablet   Oral   Take 325 mg by mouth daily.         . beclomethasone (QVAR) 80 MCG/ACT inhaler   Inhalation   Inhale 1 puff into the lungs 2 (two) times daily.   1 Inhaler   6   . benzonatate (TESSALON) 100 MG capsule   Oral   Take 100 mg by mouth 3 (three) times daily as needed for cough.          Marland Kitchen buPROPion (WELLBUTRIN SR) 200 MG 12  hr tablet   Oral   Take 200 mg by mouth 2 (two) times daily.         . cephALEXin (KEFLEX) 250 MG capsule   Oral   Take 2 capsules (500 mg total) by mouth 4 (four) times daily.   28 capsule   0   . glyBURIDE (DIABETA) 5 MG tablet   Oral   Take 2.5-5 mg by mouth 2 (two) times daily. Patient takes 1 tablet in the morning and 1/2 tablet at night         . ipratropium (ATROVENT) 0.02 % nebulizer solution   Nebulization   Take 500 mcg by nebulization 4 (four) times daily.         . lansoprazole (PREVACID) 30 MG capsule   Oral   Take 30 mg by mouth 2 (two) times daily.         Marland Kitchen oxyCODONE-acetaminophen (PERCOCET) 5-325 MG per tablet   Oral   Take 1 tablet by mouth 3 (three) times daily. pain         . predniSONE (DELTASONE) 20 MG tablet      3 tabs po day one, then 2 tabs daily x 4 days   11 tablet   0   . testosterone (ANDROGEL) 50 MG/5GM GEL   Transdermal   Place 5 g onto the  skin daily.         . metroNIDAZOLE (FLAGYL) 500 MG tablet   Oral   Take 1 tablet (500 mg total) by mouth 3 (three) times daily.   30 tablet   0   . ondansetron (ZOFRAN) 4 MG tablet   Oral   Take 1 tablet (4 mg total) by mouth every 6 (six) hours.   12 tablet   0     BP 121/69  Pulse 90  Temp(Src) 98.2 F (36.8 C) (Oral)  Resp 20  Ht 5\' 10"  (1.778 m)  Wt 275 lb (124.739 kg)  BMI 39.46 kg/m2  SpO2 97%  Physical Exam  Nursing note and vitals reviewed. Constitutional: He is oriented to person, place, and time. He appears well-developed and well-nourished. No distress.  Appears well hydrated.   HENT:  Head: Normocephalic and atraumatic.  Right Ear: External ear normal.  Left Ear: External ear normal.  Nose: Nose normal.  Mouth/Throat: Oropharynx is clear and moist.  Eyes: Conjunctivae and EOM are normal. Pupils are equal, round, and reactive to light.  Neck: Normal range of motion. Neck supple. No tracheal deviation present.  Cardiovascular: Normal rate, regular rhythm and normal heart sounds.   Pulmonary/Chest: Effort normal and breath sounds normal. No respiratory distress.  Abdominal: Soft. Bowel sounds are normal. He exhibits no distension. There is tenderness. There is no rebound and no guarding.  Epigastric and periumbilical abdominal tenderness. No RUQ abdominal pain. No CVA tenderness.  Musculoskeletal: Normal range of motion. He exhibits no edema.  Neurological: He is alert and oriented to person, place, and time.  Skin: Skin is warm and dry.  Psychiatric: He has a normal mood and affect. His behavior is normal.    ED Course  Procedures (including critical care time)  DIAGNOSTIC STUDIES: Oxygen Saturation is 98% on room air, normal by my interpretation.    COORDINATION OF CARE:  13:20-Discussed planned course of treatment with the patient including Flagyl, x-ray of abdomen, blood work and UA, who is agreeable at this time. Will place pt on contact  precautions for possible c-diff   14:00-Medication Orders: Metronidazole (Flagyl) tablet 500  mg-3 times per day  Labs Reviewed  COMPREHENSIVE METABOLIC PANEL - Abnormal; Notable for the following:    Sodium 134 (*)    Potassium 3.4 (*)    Chloride 95 (*)    Glucose, Bld 303 (*)    All other components within normal limits  URINALYSIS, ROUTINE W REFLEX MICROSCOPIC - Abnormal; Notable for the following:    Glucose, UA >1000 (*)    All other components within normal limits  CLOSTRIDIUM DIFFICILE BY PCR  STOOL CULTURE  CBC WITH DIFFERENTIAL  LIPASE, BLOOD  LACTIC ACID, PLASMA  URINE MICROSCOPIC-ADD ON   Ct Abdomen Pelvis W Contrast  01/24/2013  *RADIOLOGY REPORT*  Clinical Data: Abdominal pain  CT ABDOMEN AND PELVIS WITH CONTRAST  Technique:  Multidetector CT imaging of the abdomen and pelvis was performed following the standard protocol during bolus administration of intravenous contrast.  Contrast: 50mL OMNIPAQUE IOHEXOL 300 MG/ML  SOLN, OMNIPAQUE IOHEXOL 300 MG/ML  SOLN  Comparison: 07/01/10  Findings: Sagittal images of the spine shows mild degenerative changes thoracolumbar spine.  Visualized lung bases are unremarkable.  Again noted fatty infiltration of the liver.  No calcified gallstones are noted within gallbladder.  No aortic aneurysm.  No small bowel obstruction.  Pancreas, spleen and adrenal glands are unremarkable.  Kidneys are symmetrical in size and enhancement.  No focal renal mass.  No hydronephrosis or hydroureter.  Delayed renal images shows bilateral renal symmetrical excretion.  Bilateral visualized proximal ureter is unremarkable.  No ascites or free air.  No adenopathy.  There is no pericecal inflammation.  Mild thickening of the right colonic wall and hepatic flexure wall is probable due to incomplete colonic distention.  Less likely nonspecific colitis.  Normal appendix is clearly visualized in axial image 65.  The urinary bladder is unremarkable.  The prostate gland  and seminal vesicles are unremarkable.  The left colon and sigmoid colon is empty collapsed.  The lesions are noted within pelvis. Mild degenerative changes bilateral SI joints.  IMPRESSION:  1.  Fatty infiltration of the liver is noted. 2.  No hydronephrosis or hydroureter. 3.  No pericecal inflammation.  Normal appendix is clearly visualized.  4.  Nonspecific mild segmental thickening of the right colonic wall and wall of the hepatic flexure of the colon is most likely due to incomplete colonic distention rather than nonspecific colitis.   Original Report Authenticated By: Natasha Mead, M.D.    Dg Abd Acute W/chest  01/24/2013  *RADIOLOGY REPORT*  Clinical Data: Abdominal pain, diarrhea  ACUTE ABDOMEN SERIES (ABDOMEN 2 VIEW & CHEST 1 VIEW)  Comparison: 03/07/2012  Findings: The cardiomediastinal silhouette is stable.  No acute infiltrate or pleural effusion.  No pulmonary edema.  There is nonspecific nonobstructive bowel gas pattern.  No free abdominal air.  IMPRESSION: No acute disease.  Nonspecific nonobstructive bowel gas pattern. No free abdominal air.   Original Report Authenticated By: Natasha Mead, M.D.      1. Colitis       MDM  Crampy abdominal pain with loose stools for the past 5 days. Reports 3 loose stools daily. No vomiting. Pain comes and goes no provoking or palliating factors. Abdomen soft without peritoneal signs. Has been on high dose of Keflex and symptoms started after that.  Patient's abdominal exam is benign. His x-rays negative. Lab work shows hyperglycemia without DKA. Normal creatinine. Normal white count. We'll treat appear empirically for possible C dif.  Patient remained stable in the ED. No tachycardia, no hypotension. Tolerating by  mouth. Patient instructed to stop Keflex. We'll treat him empirically for C. difficile with Flagyl. Return precautions discussed including worsening pain, fever, vomiting diarrhea or any other concerns.  I personally performed the services  described in this documentation, which was scribed in my presence. The recorded information has been reviewed and is accurate.      Glynn Octave, MD 01/24/13 [phone]

## 2013-01-24 NOTE — ED Notes (Signed)
Patients states that the ER doctor prescribed keflex and the pharmacy called to clarify the order, states that he initially was taking 4000 mg daily of keflex.   The patient states that he is having abdominal pain and diarrhea.  States that the abdominal pain has been present since he started taking keflex, abdominal pain is worse today.   Diarrhea started when the patient started taking keflex.

## 2013-01-24 NOTE — ED Notes (Signed)
Given diet coke to drink , stated he normally drinks regular coke

## 2013-01-24 NOTE — ED Notes (Signed)
Pt reports being seen in er 2 days ago, given antibiotic given for bronchitis and now giving him ab cramps. Thinks his bronchitis is better. Dr.rancour to see pt.

## 2013-01-24 NOTE — ED Notes (Signed)
Pt given diet coke to drink  Tolerated well.

## 2013-03-22 ENCOUNTER — Other Ambulatory Visit: Payer: Self-pay

## 2013-03-22 ENCOUNTER — Telehealth: Payer: Self-pay | Admitting: Family Medicine

## 2013-03-22 MED ORDER — TIOTROPIUM BROMIDE MONOHYDRATE 18 MCG IN CAPS: 18.0000 ug | ORAL_CAPSULE | Freq: Every day | RESPIRATORY_TRACT | Status: DC

## 2013-03-22 NOTE — Telephone Encounter (Signed)
Patient wants to know if he can go back on Spiriva to [facility] on [address] in [place]

## 2013-03-22 NOTE — Telephone Encounter (Signed)
Rx sent in to [facility] per patient request. Patient notified.

## 2013-03-22 NOTE — Telephone Encounter (Signed)
Ok. Two inhalations of one capsule daily refill prn

## 2013-04-05 ENCOUNTER — Encounter (HOSPITAL_COMMUNITY): Payer: Self-pay | Admitting: *Deleted

## 2013-04-05 ENCOUNTER — Emergency Department (HOSPITAL_COMMUNITY): Payer: BC Managed Care – PPO

## 2013-04-05 ENCOUNTER — Emergency Department (HOSPITAL_COMMUNITY)
Admission: EM | Admit: 2013-04-05 | Discharge: 2013-04-06 | Disposition: A | Discharge status: Home / Self Care | Payer: BC Managed Care – PPO | Attending: Emergency Medicine | Admitting: Emergency Medicine

## 2013-04-05 ENCOUNTER — Other Ambulatory Visit: Payer: Self-pay

## 2013-04-05 DIAGNOSIS — F17200 Nicotine dependence, unspecified, uncomplicated: Secondary | ICD-10-CM | POA: Insufficient documentation

## 2013-04-05 DIAGNOSIS — G4730 Sleep apnea, unspecified: Secondary | ICD-10-CM | POA: Insufficient documentation

## 2013-04-05 DIAGNOSIS — J441 Chronic obstructive pulmonary disease with (acute) exacerbation: Secondary | ICD-10-CM | POA: Insufficient documentation

## 2013-04-05 DIAGNOSIS — R259 Unspecified abnormal involuntary movements: Secondary | ICD-10-CM | POA: Insufficient documentation

## 2013-04-05 DIAGNOSIS — E119 Type 2 diabetes mellitus without complications: Secondary | ICD-10-CM | POA: Insufficient documentation

## 2013-04-05 DIAGNOSIS — Z79899 Other long term (current) drug therapy: Secondary | ICD-10-CM | POA: Insufficient documentation

## 2013-04-05 DIAGNOSIS — R05 Cough: Secondary | ICD-10-CM | POA: Insufficient documentation

## 2013-04-05 DIAGNOSIS — R0789 Other chest pain: Secondary | ICD-10-CM | POA: Insufficient documentation

## 2013-04-05 DIAGNOSIS — R059 Cough, unspecified: Secondary | ICD-10-CM | POA: Insufficient documentation

## 2013-04-05 DIAGNOSIS — Z7982 Long term (current) use of aspirin: Secondary | ICD-10-CM | POA: Insufficient documentation

## 2013-04-05 LAB — CBC WITH DIFFERENTIAL/PLATELET
Basophils Absolute: 0.1 10*3/uL (ref 0.0–0.1)
Basophils Relative: 1 % (ref 0–1)
HCT: 41.7 % (ref 39.0–52.0)
Lymphocytes Relative: 31 % (ref 12–46)
Neutro Abs: 6.6 10*3/uL (ref 1.7–7.7)
Neutrophils Relative %: 59 % (ref 43–77)
Platelets: 220 10*3/uL (ref 150–400)
RDW: 13.6 % (ref 11.5–15.5)
WBC: 11.3 10*3/uL — ABNORMAL HIGH (ref 4.0–10.5)

## 2013-04-05 LAB — BASIC METABOLIC PANEL
Calcium: 9.1 mg/dL (ref 8.4–10.5)
Chloride: 99 mEq/L (ref 96–112)
Creatinine, Ser: 0.87 mg/dL (ref 0.50–1.35)
GFR calc Af Amer: >90 mL/min (ref >90)
GFR calc non Af Amer: >90 mL/min (ref >90)

## 2013-04-05 LAB — TROPONIN I: Troponin I: <0.3 ng/mL (ref <0.30)

## 2013-04-05 MED ORDER — IPRATROPIUM BROMIDE 0.02 % IN SOLN
0.5000 mg | Freq: Once | RESPIRATORY_TRACT | Status: AC
  Administered 2013-04-06: 0.5 mg via RESPIRATORY_TRACT
  Filled 2013-04-05: qty 2.5

## 2013-04-05 MED ORDER — ALBUTEROL SULFATE (5 MG/ML) 0.5% IN NEBU
5.0000 mg | INHALATION_SOLUTION | Freq: Once | RESPIRATORY_TRACT | Status: AC
  Administered 2013-04-06: 5 mg via RESPIRATORY_TRACT
  Filled 2013-04-05: qty 1

## 2013-04-05 MED ORDER — PREDNISONE 50 MG PO TABS
60.0000 mg | ORAL_TABLET | Freq: Once | ORAL | Status: AC
  Administered 2013-04-06: 60 mg via ORAL
  Filled 2013-04-05: qty 1

## 2013-04-05 NOTE — ED Notes (Signed)
Sob , 1 week, worse today.  Cough,

## 2013-04-05 NOTE — ED Provider Notes (Signed)
History     This chart was scribed for Lyanne Co, MD, MD by Smitty Pluck, ED Scribe. The patient was seen in room APA10/APA10 and the patient's care was started at 11:52 PM.   CSN: [id number]  Arrival date & time 04/05/13  2132      Chief Complaint  Patient presents with  . Shortness of Breath     The history is provided by the patient and medical records. No language interpreter was used.   Joseph [name] is a 42 y.o. male with hx of COPD who presents to the Emergency Department complaining of constant, moderate SOB onset 1 week ago but worsening today with chest tightness and shakiness. Pt reports that he has used nebulizer without relief. He states he has nonproductive cough. He mentions that current symptoms feel similar to past episodes of COPD exacerbation. Pt denies fever, chills, nausea, vomiting, diarrhea, weakness and any other pain.    Past Medical History  Diagnosis Date  . Diabetes mellitus   . COPD (chronic obstructive pulmonary disease)   . Sleep apnea   . Asthma     Past Surgical History  Procedure Laterality Date  . Carpal tunnel release    . Shoulder arthrocentesis    . Hernia repair    . Vasectomy      Family History  Problem Relation Age of Onset  . Emphysema Maternal Grandfather   . Emphysema Maternal Grandmother   . Clotting disorder Maternal Grandmother     History  Substance Use Topics  . Smoking status: Current Every Day Smoker -- 2.00 packs/day for 20 years    Types: Cigarettes  . Smokeless tobacco: Not on file  . Alcohol Use: No      Review of Systems 10 Systems reviewed and all are negative for acute change except as noted in the HPI.   Allergies  Azithromycin; Erythromycin; Metformin; and Doxycycline  Home Medications   Current Outpatient Rx  Name  Route  Sig  Dispense  Refill  . albuterol (PROVENTIL HFA;VENTOLIN HFA) 108 (90 BASE) MCG/ACT inhaler   Inhalation   Inhale 1-2 puffs into the lungs every 6 (six) hours as  needed for wheezing.   1 Inhaler   0   . albuterol (PROVENTIL) (2.5 MG/3ML) 0.083% nebulizer solution   Nebulization   Take 2.5 mg by nebulization 4 (four) times daily.         Marland Kitchen ALPRAZolam (XANAX) 0.5 MG tablet   Oral   Take 0.5 mg by mouth 2 (two) times daily.         Marland Kitchen aspirin 325 MG EC tablet   Oral   Take 325 mg by mouth daily.         . citalopram (CELEXA) 40 MG tablet   Oral   Take 40 mg by mouth every morning.         . glyBURIDE (DIABETA) 5 MG tablet   Oral   Take 2.5-5 mg by mouth 2 (two) times daily. Patient takes 1 tablet in the morning and 1/2 tablet at night         . ipratropium (ATROVENT) 0.02 % nebulizer solution   Nebulization   Take 500 mcg by nebulization 4 (four) times daily.         . Multiple Vitamin (MULTIVITAMIN WITH MINERALS) TABS   Oral   Take 1 tablet by mouth every morning.         Marland Kitchen oxyCODONE-acetaminophen (PERCOCET) 5-325 MG per tablet   Oral  Take 1 tablet by mouth 3 (three) times daily. pain         . Testosterone (ANDROGEL PUMP) 20.25 MG/ACT (1.62%) GEL   Transdermal   Place 20.25 mg onto the skin 2 (two) times daily.         Marland Kitchen triamcinolone (NASACORT) 55 MCG/ACT nasal inhaler   Nasal   Place 2 sprays into the nose daily.           BP 136/74  Pulse 94  Temp(Src) 98.4 F (36.9 C) (Oral)  Resp 20  Ht 5\' 10"  (1.778 m)  Wt 260 lb (117.935 kg)  BMI 37.31 kg/m2  SpO2 96%  Physical Exam  Nursing note and vitals reviewed. Constitutional: He is oriented to person, place, and time. He appears well-developed and well-nourished.  HENT:  Head: Normocephalic and atraumatic.  Eyes: EOM are normal.  Neck: Normal range of motion.  Cardiovascular: Normal rate, regular rhythm, normal heart sounds and intact distal pulses.   Pulmonary/Chest: Effort normal and breath sounds normal. No respiratory distress.  Abdominal: Soft. He exhibits no distension. There is no tenderness.  Genitourinary: Rectum normal.   Musculoskeletal: Normal range of motion.  Neurological: He is alert and oriented to person, place, and time.  Skin: Skin is warm and dry.  Psychiatric: He has a normal mood and affect. Judgment normal.    ED Course  Procedures (including critical care time) DIAGNOSTIC STUDIES: Oxygen Saturation is 96% on room air, adequate by my interpretation.    COORDINATION OF CARE: 11:55 PM Discussed ED treatment with pt and pt agrees.  Medications  albuterol (PROVENTIL) (5 MG/ML) 0.5% nebulizer solution 5 mg (not administered)  ipratropium (ATROVENT) nebulizer solution 0.5 mg (not administered)  predniSONE (DELTASONE) tablet 60 mg (not administered)    Date: 04/06/2013  Rate: 87  Rhythm: normal sinus rhythm  QRS Axis: normal  Intervals: normal  ST/T Wave abnormalities: normal  Conduction Disutrbances: none  Narrative Interpretation:   Old EKG Reviewed: No significant changes noted      Labs Reviewed  BASIC METABOLIC PANEL - Abnormal; Notable for the following:    Glucose, Bld 138 (*)    All other components within normal limits  CBC WITH DIFFERENTIAL - Abnormal; Notable for the following:    WBC 11.3 (*)    All other components within normal limits  TROPONIN I   Dg Chest 2 View  04/05/2013  *RADIOLOGY REPORT*  Clinical Data: Shortness of breath, history COPD, smoking  CHEST - 2 VIEW  Comparison: 03/07/2012; chest CT - 03/07/2012  Findings: Grossly unchanged cardiac silhouette and mediastinal contours.  There is grossly unchanged mild diffuse thickening of the pulmonary interstitium.  No new focal airspace opacity.  No definite evidence of pulmonary edema.  No definite pleural effusion or pneumothorax.  Unchanged bones.  IMPRESSION: Mild bronchitic change without acute cardiopulmonary disease.   Original Report Authenticated By: Tacey Ruiz, MD    I personally reviewed the imaging tests through PACS system I reviewed available ER/hospitalization records through the EMR   1. COPD  exacerbation       MDM  12:55 AM Feeling better now. Likely COPD exacerbation. Home with steroids and short course of abx    I personally performed the services described in this documentation, which was scribed in my presence. The recorded information has been reviewed and is accurate.          Lyanne Co, MD 04/06/13 [phone]

## 2013-04-06 MED ORDER — LEVOFLOXACIN 500 MG PO TABS: 500.0000 mg | ORAL_TABLET | Freq: Every day | ORAL | Status: DC

## 2013-04-06 MED ORDER — PREDNISONE 10 MG PO TABS: 60.0000 mg | ORAL_TABLET | Freq: Every day | ORAL | Status: DC

## 2013-04-06 NOTE — ED Provider Notes (Signed)
Pharmacy called and states there was an interaction with the Levaquin and his Celexa. They state he has been on Ceftin in December, and Keflex in February. However they are out of Ceftin. He was prescribed Cefzil.  Ward Givens, MD 04/06/13 1406

## 2013-04-20 ENCOUNTER — Telehealth: Payer: Self-pay | Admitting: *Deleted

## 2013-04-20 NOTE — Telephone Encounter (Signed)
Left message on vm, to call back with clarification of spiriva and advair

## 2013-04-21 ENCOUNTER — Other Ambulatory Visit: Payer: Self-pay | Admitting: *Deleted

## 2013-04-21 MED ORDER — FLUTICASONE-SALMETEROL 250-50 MCG/DOSE IN AEPB: 1.0000 | INHALATION_SPRAY | Freq: Two times a day (BID) | RESPIRATORY_TRACT | Status: DC

## 2013-04-28 ENCOUNTER — Other Ambulatory Visit: Payer: Self-pay | Admitting: Family Medicine

## 2013-05-03 ENCOUNTER — Telehealth: Payer: Self-pay | Admitting: Family Medicine

## 2013-05-03 NOTE — Telephone Encounter (Signed)
Pt has appt on 05/06/13 but he is out of Percocet 5/325 as of now and wants to know if he can get enough to get him through till his appt on Thursday,

## 2013-05-03 NOTE — Telephone Encounter (Signed)
Left message on voicemail to return call.

## 2013-05-03 NOTE — Telephone Encounter (Signed)
No, needs to wait

## 2013-05-04 NOTE — Telephone Encounter (Signed)
Patient notified to wait til Thursday.

## 2013-05-06 ENCOUNTER — Ambulatory Visit (INDEPENDENT_AMBULATORY_CARE_PROVIDER_SITE_OTHER): Payer: BC Managed Care – PPO | Admitting: Family Medicine

## 2013-05-06 ENCOUNTER — Encounter: Payer: Self-pay | Admitting: Family Medicine

## 2013-05-06 VITALS — BP 118/80 | Temp 98.3°F | Wt 277.8 lb

## 2013-05-06 DIAGNOSIS — J449 Chronic obstructive pulmonary disease, unspecified: Secondary | ICD-10-CM

## 2013-05-06 DIAGNOSIS — K645 Perianal venous thrombosis: Secondary | ICD-10-CM

## 2013-05-06 DIAGNOSIS — G894 Chronic pain syndrome: Secondary | ICD-10-CM

## 2013-05-06 DIAGNOSIS — E119 Type 2 diabetes mellitus without complications: Secondary | ICD-10-CM

## 2013-05-06 LAB — POCT GLYCOSYLATED HEMOGLOBIN (HGB A1C): Hemoglobin A1C: 7.4

## 2013-05-06 MED ORDER — OXYCODONE-ACETAMINOPHEN 5-325 MG PO TABS: 1.0000 | ORAL_TABLET | Freq: Three times a day (TID) | ORAL | Status: DC

## 2013-05-06 NOTE — Progress Notes (Signed)
  Subjective:    Patient ID: Joseph [name], male    DOB: 01-27-1971, 42 y.o.   MRN: [id number]  HPI meds doing fine. No low sugar spells trying to watch his diet. Claims mostly compliance with his medication. Has mostly cut his simple sugars out which actually is a big improvement compared to before. Results for orders placed in visit on 05/06/13  POCT GLYCOSYLATED HEMOGLOBIN (HGB A1C)      Result Value Range   Hemoglobin A1C 7.4     notes ongoing challenges with breathing. Unfortunately still smoking. Uses the inhaler regularly and it definitely helps.  As far as his chronic pain. This is ongoing. He needs his refills written today. States that without the medication he cannot function. Claims no significant daytime drowsiness.   Hemorrhoid pain severe. Onset this past wk. Some bleeding.  Review of Systems No chest pain no headache no weight loss 12 point review systems otherwise negative    Objective:   Physical Exam Alert some distress. HEENT slight nasal congestion. Lungs no wheezes currently heart regular rate and rhythm. Feet sensation intact pulses good trace edema at most. Rectal exam reveals a perirectal hemorrhoid inflamed very tender. Patient in some distress with exam.  Procedure note patient was draped and prepped. Then he was anesthetized with Xylocaine. Betadine was applied to the surgical field. Incision with 11 oh blade through the hemorrhoid delivered some old blood. Patient tolerated procedure well. Appropriate dressing applied and wound care discussed.     Assessment & Plan:  Impression #1 type 2 diabetes decent control. Encouraged to maintain compliance. #2 chronic pain ongoing definitely needs his medication. #3 COPD encouraged once again to stop smoking. Maintain meds. #4 thrombosed hemorrhoid. Procedure today. Wound care discussed. Antibiotic added in case of infection component. Followup as scheduled

## 2013-05-10 ENCOUNTER — Telehealth: Payer: Self-pay | Admitting: Family Medicine

## 2013-05-10 NOTE — Telephone Encounter (Signed)
[facility] mag m w swish and spit qid one tablsespoon 12 oz

## 2013-05-10 NOTE — Telephone Encounter (Signed)
[facility] magic  mouth wash swish and spit qid one teaspoon 12 oz called into [facility]. Pt notified.

## 2013-05-10 NOTE — Telephone Encounter (Signed)
Patient says that the antibiotic that you prescribed him has given him thrush and he would like something called in for this  [facility]

## 2013-05-13 DIAGNOSIS — E1165 Type 2 diabetes mellitus with hyperglycemia: Secondary | ICD-10-CM | POA: Insufficient documentation

## 2013-05-13 DIAGNOSIS — G894 Chronic pain syndrome: Secondary | ICD-10-CM | POA: Insufficient documentation

## 2013-05-13 DIAGNOSIS — E119 Type 2 diabetes mellitus without complications: Secondary | ICD-10-CM | POA: Insufficient documentation

## 2013-05-28 ENCOUNTER — Other Ambulatory Visit: Payer: Self-pay | Admitting: *Deleted

## 2013-06-11 ENCOUNTER — Other Ambulatory Visit: Payer: Self-pay | Admitting: *Deleted

## 2013-06-11 MED ORDER — BUDESONIDE-FORMOTEROL FUMARATE 160-4.5 MCG/ACT IN AERO: 2.0000 | INHALATION_SPRAY | Freq: Two times a day (BID) | RESPIRATORY_TRACT | Status: DC

## 2013-06-19 ENCOUNTER — Other Ambulatory Visit: Payer: Self-pay | Admitting: Family Medicine

## 2013-06-21 NOTE — Telephone Encounter (Signed)
Ok five ref may fill monthl

## 2013-06-21 NOTE — Telephone Encounter (Signed)
RX called in .

## 2013-07-16 ENCOUNTER — Other Ambulatory Visit: Payer: Self-pay | Admitting: Family Medicine

## 2013-07-16 NOTE — Telephone Encounter (Signed)
Ok ref x 6

## 2013-08-12 ENCOUNTER — Ambulatory Visit (INDEPENDENT_AMBULATORY_CARE_PROVIDER_SITE_OTHER): Payer: BC Managed Care – PPO | Admitting: Family Medicine

## 2013-08-12 ENCOUNTER — Encounter: Payer: Self-pay | Admitting: Family Medicine

## 2013-08-12 VITALS — BP 128/84 | Ht 70.0 in | Wt 293.6 lb

## 2013-08-12 DIAGNOSIS — G894 Chronic pain syndrome: Secondary | ICD-10-CM

## 2013-08-12 DIAGNOSIS — E119 Type 2 diabetes mellitus without complications: Secondary | ICD-10-CM

## 2013-08-12 DIAGNOSIS — J449 Chronic obstructive pulmonary disease, unspecified: Secondary | ICD-10-CM

## 2013-08-12 LAB — POCT GLYCOSYLATED HEMOGLOBIN (HGB A1C): Hemoglobin A1C: 7.3

## 2013-08-12 MED ORDER — LORAZEPAM 1 MG PO TABS: 1.0000 mg | ORAL_TABLET | Freq: Every day | ORAL | Status: DC

## 2013-08-12 NOTE — Progress Notes (Signed)
  Subjective:    Patient ID: Joseph [name], male    DOB: [birth year], 42 y.o.   MRN: [id number]  Diabetes He presents for his follow-up diabetic visit. He has type 2 diabetes mellitus. Pertinent negatives for hypoglycemia include no confusion or dizziness. Pertinent negatives for diabetes include no blurred vision and no chest pain. Symptoms are worsening. There are no known risk factors for coronary artery disease. He is compliant with treatment some of the time. He is following a generally healthy diet. Meal planning includes avoidance of concentrated sweets. He participates in exercise intermittently. His home blood glucose trend is fluctuating minimally. His breakfast blood glucose is taken between 8-9 am. His breakfast blood glucose range is generally 130-140 mg/dl.   Here today for 3 month diabetes check up.   Does not like Symbicort. Causes metallic taste. Not taking.Breathing overall decent. More troubles with it during humid days.  Results for orders placed in visit on 08/12/13  POCT GLYCOSYLATED HEMOGLOBIN (HGB A1C)      Result Value Range   Hemoglobin A1C 7.3       Felt lethargic for the past week. Some diminished energy he claims no depression.  States she definitely needs his medication for his chronic pain. See prior notes. Without the medicine cannot function. Claims no drowsiness.  States trouble sleeping. Feels the Xanax no longer helping. Would like to try something else.   Review of Systems  Eyes: Negative for blurred vision.  Cardiovascular: Negative for chest pain.  Neurological: Negative for dizziness.  Psychiatric/Behavioral: Negative for confusion.       Objective:   Physical Exam  Alert no acute distress. HEENT normal. Vital stable. Lungs clear. Heart regular rate and rhythm. Lung sounds diminished. Ankles without edema. Sensation intact. Pulses good.      Assessment & Plan:  Impression #1 type 2 diabetes control improving. #2 chronic pain ongoing. #3  COPD severe patient diminished on smoking but still persists. #4 depression and ongoing challenges no suicidal thoughts just a lot of stress. #5 insomnia ongoing and severe. Plan stop Xanax. Start Ativan. Cut down smoking. Exercise regularly. Medications discussed. Pain medicines written out. Recheck in several months. WSL

## 2013-08-18 ENCOUNTER — Emergency Department (HOSPITAL_COMMUNITY)
Admission: EM | Admit: 2013-08-18 | Discharge: 2013-08-18 | Disposition: A | Discharge status: Home / Self Care | Payer: BC Managed Care – PPO | Attending: Emergency Medicine | Admitting: Emergency Medicine

## 2013-08-18 ENCOUNTER — Emergency Department (HOSPITAL_COMMUNITY): Payer: BC Managed Care – PPO

## 2013-08-18 ENCOUNTER — Encounter (HOSPITAL_COMMUNITY): Payer: Self-pay

## 2013-08-18 DIAGNOSIS — W19XXXA Unspecified fall, initial encounter: Secondary | ICD-10-CM | POA: Insufficient documentation

## 2013-08-18 DIAGNOSIS — J4489 Other specified chronic obstructive pulmonary disease: Secondary | ICD-10-CM | POA: Insufficient documentation

## 2013-08-18 DIAGNOSIS — E119 Type 2 diabetes mellitus without complications: Secondary | ICD-10-CM | POA: Insufficient documentation

## 2013-08-18 DIAGNOSIS — F17200 Nicotine dependence, unspecified, uncomplicated: Secondary | ICD-10-CM | POA: Insufficient documentation

## 2013-08-18 DIAGNOSIS — J449 Chronic obstructive pulmonary disease, unspecified: Secondary | ICD-10-CM | POA: Insufficient documentation

## 2013-08-18 DIAGNOSIS — S8990XA Unspecified injury of unspecified lower leg, initial encounter: Secondary | ICD-10-CM | POA: Insufficient documentation

## 2013-08-18 DIAGNOSIS — Y929 Unspecified place or not applicable: Secondary | ICD-10-CM | POA: Insufficient documentation

## 2013-08-18 DIAGNOSIS — Y9389 Activity, other specified: Secondary | ICD-10-CM | POA: Insufficient documentation

## 2013-08-18 DIAGNOSIS — Z79899 Other long term (current) drug therapy: Secondary | ICD-10-CM | POA: Insufficient documentation

## 2013-08-18 DIAGNOSIS — M25562 Pain in left knee: Secondary | ICD-10-CM

## 2013-08-18 DIAGNOSIS — Z7982 Long term (current) use of aspirin: Secondary | ICD-10-CM | POA: Insufficient documentation

## 2013-08-18 MED ORDER — NAPROXEN 250 MG PO TABS
500.0000 mg | ORAL_TABLET | Freq: Once | ORAL | Status: AC
  Administered 2013-08-18: 500 mg via ORAL
  Filled 2013-08-18: qty 2

## 2013-08-18 NOTE — ED Notes (Signed)
J. Idol, PA at bedside. 

## 2013-08-18 NOTE — ED Provider Notes (Signed)
CSN: [id number]     Arrival date & time 08/18/13  1556 History   First MD Initiated Contact with Patient 08/18/13 1617     Chief Complaint  Patient presents with  . Knee Pain   (Consider location/radiation/quality/duration/timing/severity/associated sxs/prior Treatment) HPI Comments: Joseph [name] is a 42 y.o. Male presenting with acute left knee pain.  He describes chronic intermittent popping and clicking in the knee which he suspects is from an old crush type injury from many years ago which has never been worked up or a significant problem for him.  The past few weeks the knee has been "locking" in a extended position when weight bearing, until he fell 2 days ago when the knee buckled on him causing him to land directly with his upper tibia against the cement edge of his sidewalk.  He reports severe swelling which has improved with ice and rest and has developed a large bruise at the site.  He takes percocet for chronic shoulder pain which relieves his new knee pain symptom as well.  His orthopedist is with [facility].  He is able to flex and extend the knee but with increased pain.  He denies hip and ankle pain.     The history is provided by the patient.    Past Medical History  Diagnosis Date  . Diabetes mellitus   . COPD (chronic obstructive pulmonary disease)   . Sleep apnea   . Asthma    Past Surgical History  Procedure Laterality Date  . Carpal tunnel release    . Shoulder arthrocentesis    . Hernia repair    . Vasectomy     Family History  Problem Relation Age of Onset  . Emphysema Maternal Grandfather   . Emphysema Maternal Grandmother   . Clotting disorder Maternal Grandmother    History  Substance Use Topics  . Smoking status: Current Every Day Smoker -- 2.00 packs/day for 20 years    Types: Cigarettes  . Smokeless tobacco: Not on file  . Alcohol Use: No    Review of Systems  Constitutional: Negative for fever.  Musculoskeletal: Positive for  arthralgias. Negative for myalgias.  Skin: Positive for color change.  Neurological: Negative for weakness and numbness.    Allergies  Azithromycin; Erythromycin; Metformin; Symbicort; and Doxycycline  Home Medications   Current Outpatient Rx  Name  Route  Sig  Dispense  Refill  . albuterol (PROVENTIL) (2.5 MG/3ML) 0.083% nebulizer solution   Nebulization   Take 2.5 mg by nebulization 4 (four) times daily.         Marland Kitchen aspirin 325 MG EC tablet   Oral   Take 325 mg by mouth 3 (three) times daily.          . citalopram (CELEXA) 40 MG tablet   Oral   Take 40 mg by mouth every morning.         . glyBURIDE (DIABETA) 5 MG tablet   Oral   Take 5 mg by mouth 2 (two) times daily.          Marland Kitchen ipratropium (ATROVENT) 0.02 % nebulizer solution   Nebulization   Take 500 mcg by nebulization 4 (four) times daily.         Marland Kitchen LORazepam (ATIVAN) 1 MG tablet   Oral   Take 1 tablet (1 mg total) by mouth at bedtime.   30 tablet   0   . Multiple Vitamin (MULTIVITAMIN WITH MINERALS) TABS   Oral   Take 1  tablet by mouth every morning.         Marland Kitchen oxyCODONE-acetaminophen (PERCOCET/ROXICET) 5-325 MG per tablet   Oral   Take 1 tablet by mouth 3 (three) times daily. pain   90 tablet   0   . Testosterone (ANDROGEL PUMP) 20.25 MG/ACT (1.62%) GEL   Transdermal   Place onto the skin daily. 2 pumps/applicationns applied daily         . EXPIRED: albuterol (PROVENTIL HFA;VENTOLIN HFA) 108 (90 BASE) MCG/ACT inhaler   Inhalation   Inhale 1-2 puffs into the lungs every 6 (six) hours as needed for wheezing.   1 Inhaler   0    BP 135/79  Pulse 89  Temp(Src) 98.8 F (37.1 C) (Oral)  Resp 24  Ht 5\' 10"  (1.778 m)  Wt 290 lb (131.543 kg)  BMI 41.61 kg/m2  SpO2 98% Physical Exam  Constitutional: He appears well-developed and well-nourished.  HENT:  Head: Atraumatic.  Neck: Normal range of motion.  Cardiovascular:  Pulses equal bilaterally  Musculoskeletal: He exhibits edema and  tenderness.       Left knee: He exhibits swelling, ecchymosis and bony tenderness. He exhibits normal range of motion, no deformity, no erythema, normal alignment, no LCL laxity and no MCL laxity. Tenderness found. Medial joint line and MCL tenderness noted.  Neurological: He is alert. He has normal strength. He displays normal reflexes. No sensory deficit.  Equal strength  Skin: Skin is warm and dry.  Psychiatric: He has a normal mood and affect.    ED Course  Procedures (including critical care time) Labs Review Labs Reviewed - No data to display Imaging Review Dg Knee Complete 4 Views Left  08/18/2013   CLINICAL DATA:  Knee gave out, fell, pain and bruising.  EXAM: LEFT KNEE - COMPLETE 4+ VIEW  COMPARISON:  None.  FINDINGS: No effusion. Small traction spur from the patella at the insertion of the quadriceps tendon. Negative for fracture, dislocation, or other acute bone abnormality. Normal mineralization and alignment.  IMPRESSION: Negative.   Electronically Signed   By: Oley Balm M.D.   On: 08/18/2013 17:32    MDM   1. Knee pain, acute, left    xrays reviewed.  Pt placed in knee immobilizer given history of knee weakness.  Crutches.  Referral to ortho for further eval.    Burgess Amor, PA-C 08/18/13 1755

## 2013-08-18 NOTE — ED Notes (Signed)
Pt reports that left knee has "been going out on him" for awhile, went out on Monday and he fell in concrete, bruising noted.

## 2013-08-18 NOTE — ED Notes (Signed)
Pt with swelling and bruising noted to left knee after a fall after his left knee gave out few days ago, pt stated he landed on left knee with most of his weight with fall, states that left knee has been "going out" for 3 weeks, admits to taking percocet

## 2013-08-20 ENCOUNTER — Other Ambulatory Visit: Payer: Self-pay | Admitting: *Deleted

## 2013-08-20 MED ORDER — GLYBURIDE 5 MG PO TABS: 5.0000 mg | ORAL_TABLET | Freq: Two times a day (BID) | ORAL | Status: DC

## 2013-08-20 NOTE — ED Provider Notes (Signed)
Medical screening examination/treatment/procedure(s) were performed by non-physician practitioner and as supervising physician I was immediately available for consultation/collaboration.  Audree Camel, MD 08/20/13 1215

## 2013-09-15 ENCOUNTER — Telehealth: Payer: Self-pay | Admitting: Family Medicine

## 2013-09-15 ENCOUNTER — Telehealth: Payer: Self-pay

## 2013-09-15 MED ORDER — LORAZEPAM 1 MG PO TABS: 1.0000 mg | ORAL_TABLET | Freq: Every day | ORAL | Status: DC

## 2013-09-15 NOTE — Telephone Encounter (Signed)
Ok plu two monthly ref

## 2013-09-15 NOTE — Telephone Encounter (Signed)
LORazepam (ATIVAN) 1 MG tablet   Pt states he is doing well on this med   Can he get a refill

## 2013-09-15 NOTE — Telephone Encounter (Signed)
Patient states that he can't take symbicort because it makes him very sick and his insurance no longer covers advair and it is to expensive. He wants to know if he can get a prescription for spirivia written and see if his insurance will cover this.

## 2013-09-15 NOTE — Telephone Encounter (Signed)
Patient notified script will be signed and faxed to pharmacy. [facility]

## 2013-09-15 NOTE — Telephone Encounter (Signed)
Last office visit 08/12/13

## 2013-09-17 MED ORDER — TIOTROPIUM BROMIDE MONOHYDRATE 18 MCG IN CAPS: ORAL_CAPSULE | RESPIRATORY_TRACT | Status: DC

## 2013-09-17 NOTE — Telephone Encounter (Signed)
spiriva two inhalations daily 11 ref

## 2013-09-17 NOTE — Telephone Encounter (Signed)
Medication was sent in to [facility] pharmacy per patient's request.

## 2013-09-17 NOTE — Telephone Encounter (Signed)
Left message on voicemail to return call. (Need name of pharmacy)

## 2013-10-12 ENCOUNTER — Other Ambulatory Visit: Payer: Self-pay | Admitting: Family Medicine

## 2013-11-01 ENCOUNTER — Other Ambulatory Visit: Payer: Self-pay

## 2013-11-01 MED ORDER — ALBUTEROL SULFATE HFA 108 (90 BASE) MCG/ACT IN AERS: 1.0000 | INHALATION_SPRAY | Freq: Four times a day (QID) | RESPIRATORY_TRACT | Status: DC | PRN

## 2013-11-12 ENCOUNTER — Telehealth: Payer: Self-pay | Admitting: Family Medicine

## 2013-11-12 ENCOUNTER — Other Ambulatory Visit: Payer: Self-pay | Admitting: Family Medicine

## 2013-11-12 MED ORDER — OXYCODONE-ACETAMINOPHEN 5-325 MG PO TABS: 1.0000 | ORAL_TABLET | Freq: Three times a day (TID) | ORAL | Status: DC

## 2013-11-12 NOTE — Telephone Encounter (Signed)
May give 2 weeks BUT needs OV

## 2013-11-12 NOTE — Telephone Encounter (Signed)
Patient notified and verbalized understanding. 

## 2013-11-12 NOTE — Telephone Encounter (Signed)
Patient needs Rx for percocet °

## 2013-11-12 NOTE — Telephone Encounter (Signed)
Last office visit 08/12/13

## 2013-11-18 ENCOUNTER — Ambulatory Visit (INDEPENDENT_AMBULATORY_CARE_PROVIDER_SITE_OTHER): Payer: BC Managed Care – PPO | Admitting: Family Medicine

## 2013-11-18 ENCOUNTER — Encounter: Payer: Self-pay | Admitting: Family Medicine

## 2013-11-18 VITALS — Ht 70.0 in | Wt 294.4 lb

## 2013-11-18 DIAGNOSIS — J449 Chronic obstructive pulmonary disease, unspecified: Secondary | ICD-10-CM

## 2013-11-18 DIAGNOSIS — E119 Type 2 diabetes mellitus without complications: Secondary | ICD-10-CM

## 2013-11-18 DIAGNOSIS — G894 Chronic pain syndrome: Secondary | ICD-10-CM

## 2013-11-18 DIAGNOSIS — J4489 Other specified chronic obstructive pulmonary disease: Secondary | ICD-10-CM

## 2013-11-18 MED ORDER — OXYCODONE-ACETAMINOPHEN 5-325 MG PO TABS: 1.0000 | ORAL_TABLET | Freq: Three times a day (TID) | ORAL | Status: DC

## 2013-11-18 MED ORDER — BENZONATATE 100 MG PO CAPS: 100.0000 mg | ORAL_CAPSULE | Freq: Three times a day (TID) | ORAL | Status: DC | PRN

## 2013-11-18 MED ORDER — LEVOFLOXACIN 500 MG PO TABS: 500.0000 mg | ORAL_TABLET | Freq: Every day | ORAL | Status: AC

## 2013-11-18 NOTE — Progress Notes (Signed)
   Subjective:    Patient ID: Joseph [name], male    DOB: 27-Oct-1971, 42 y.o.   MRN: [id number]  HPI  Patient arrives for a follow up on diabetes. Trying to watch diet. Has cut down sugars in the diet. Claims compliance with his meds. Most of his sugars are in good control.  Ongoing chronic pain. Severe in nature. States she definitely needs his medications to help his discomfort.  Ongoing challenges with COPD. Wheezing at times. Unfortunately still smokes. Claims compliance with meds.  Patient also noticed some congestion forming in his chest. Worse over the past week productive cough nature.  A lot of stress and anxiety. Tough year. No overt depression per patient.  Results for orders placed in visit on 11/18/13  POCT GLYCOSYLATED HEMOGLOBIN (HGB A1C)      Result Value Range   Hemoglobin A1C 6.8        Review of Systems No chest pain no abdominal pain no change have some blood in stool no rash ROS otherwise negative    Objective:   Physical Exam  Alert no apparent distress intermittent impressive cough during exam. HEENT normal. Lungs no wheezes some rhonchi no crackles no tachypnea heart rare in rhythm. Ankles without edema.      Assessment & Plan:  Impression 1 chronic pain decent control discussed. #2 subacute bronchitis discussed. #3 COPD ongoing and severe. #4 type 2 diabetes control good. Plan diet exercise discussed. Maintain same medicines. Add Levaquin. Warning signs discussed. Pain medications written out. Recheck in several months. WSL

## 2013-12-09 ENCOUNTER — Encounter (HOSPITAL_COMMUNITY): Payer: Self-pay | Admitting: Emergency Medicine

## 2013-12-09 ENCOUNTER — Emergency Department (HOSPITAL_COMMUNITY): Payer: BC Managed Care – PPO

## 2013-12-09 ENCOUNTER — Emergency Department (HOSPITAL_COMMUNITY)
Admission: EM | Admit: 2013-12-09 | Discharge: 2013-12-09 | Disposition: A | Discharge status: Home / Self Care | Payer: BC Managed Care – PPO | Attending: Emergency Medicine | Admitting: Emergency Medicine

## 2013-12-09 DIAGNOSIS — R0600 Dyspnea, unspecified: Secondary | ICD-10-CM

## 2013-12-09 DIAGNOSIS — Z7982 Long term (current) use of aspirin: Secondary | ICD-10-CM | POA: Insufficient documentation

## 2013-12-09 DIAGNOSIS — J45901 Unspecified asthma with (acute) exacerbation: Principal | ICD-10-CM

## 2013-12-09 DIAGNOSIS — J441 Chronic obstructive pulmonary disease with (acute) exacerbation: Secondary | ICD-10-CM | POA: Insufficient documentation

## 2013-12-09 DIAGNOSIS — E119 Type 2 diabetes mellitus without complications: Secondary | ICD-10-CM | POA: Insufficient documentation

## 2013-12-09 DIAGNOSIS — G479 Sleep disorder, unspecified: Secondary | ICD-10-CM | POA: Insufficient documentation

## 2013-12-09 DIAGNOSIS — F17200 Nicotine dependence, unspecified, uncomplicated: Secondary | ICD-10-CM | POA: Insufficient documentation

## 2013-12-09 DIAGNOSIS — Z79899 Other long term (current) drug therapy: Secondary | ICD-10-CM | POA: Insufficient documentation

## 2013-12-09 DIAGNOSIS — R42 Dizziness and giddiness: Secondary | ICD-10-CM | POA: Insufficient documentation

## 2013-12-09 DIAGNOSIS — R197 Diarrhea, unspecified: Secondary | ICD-10-CM | POA: Insufficient documentation

## 2013-12-09 LAB — CBC WITH DIFFERENTIAL/PLATELET
BASOS PCT: 1 % (ref 0–1)
Basophils Absolute: 0.1 10*3/uL (ref 0.0–0.1)
Eosinophils Absolute: 0.2 10*3/uL (ref 0.0–0.7)
Eosinophils Relative: 3 % (ref 0–5)
HEMATOCRIT: 43.9 % (ref 39.0–52.0)
HEMOGLOBIN: 15.3 g/dL (ref 13.0–17.0)
LYMPHS PCT: 22 % (ref 12–46)
Lymphs Abs: 1.6 10*3/uL (ref 0.7–4.0)
MCH: 31.2 pg (ref 26.0–34.0)
MCHC: 34.9 g/dL (ref 30.0–36.0)
MCV: 89.4 fL (ref 78.0–100.0)
MONO ABS: 1 10*3/uL (ref 0.1–1.0)
MONOS PCT: 13 % — AB (ref 3–12)
NEUTROS ABS: 4.5 10*3/uL (ref 1.7–7.7)
Neutrophils Relative %: 61 % (ref 43–77)
Platelets: 194 10*3/uL (ref 150–400)
RBC: 4.91 MIL/uL (ref 4.22–5.81)
RDW: 13.5 % (ref 11.5–15.5)
WBC: 7.4 10*3/uL (ref 4.0–10.5)

## 2013-12-09 LAB — COMPREHENSIVE METABOLIC PANEL
ALT: 35 U/L (ref 0–53)
AST: 22 U/L (ref 0–37)
Albumin: 4.1 g/dL (ref 3.5–5.2)
Alkaline Phosphatase: 69 U/L (ref 39–117)
BILIRUBIN TOTAL: 0.5 mg/dL (ref 0.3–1.2)
BUN: 6 mg/dL (ref 6–23)
CHLORIDE: 98 meq/L (ref 96–112)
CO2: 26 meq/L (ref 19–32)
CREATININE: 0.84 mg/dL (ref 0.50–1.35)
Calcium: 9.5 mg/dL (ref 8.4–10.5)
Glucose, Bld: 175 mg/dL — ABNORMAL HIGH (ref 70–99)
Potassium: 4.2 mEq/L (ref 3.7–5.3)
Sodium: 138 mEq/L (ref 137–147)
Total Protein: 7.5 g/dL (ref 6.0–8.3)

## 2013-12-09 LAB — TROPONIN I

## 2013-12-09 MED ORDER — ALBUTEROL SULFATE (2.5 MG/3ML) 0.083% IN NEBU
5.0000 mg | INHALATION_SOLUTION | Freq: Once | RESPIRATORY_TRACT | Status: AC
  Administered 2013-12-09: 5 mg via RESPIRATORY_TRACT
  Filled 2013-12-09: qty 6

## 2013-12-09 MED ORDER — KETOROLAC TROMETHAMINE 30 MG/ML IJ SOLN
30.0000 mg | Freq: Once | INTRAMUSCULAR | Status: AC
  Administered 2013-12-09: 30 mg via INTRAMUSCULAR
  Filled 2013-12-09: qty 1

## 2013-12-09 MED ORDER — PREDNISONE 20 MG PO TABS: 60.0000 mg | ORAL_TABLET | ORAL | Status: AC

## 2013-12-09 MED ORDER — PREDNISONE 50 MG PO TABS
60.0000 mg | ORAL_TABLET | ORAL | Status: AC
  Administered 2013-12-09: 60 mg via ORAL
  Filled 2013-12-09 (×2): qty 1

## 2013-12-09 NOTE — Discharge Instructions (Signed)
As discussed, please take pulmonary function testing results to your physician next week.  Return here for concerning changes in your condition.

## 2013-12-09 NOTE — ED Notes (Signed)
Cough, pain in chest, has been seen by Dr Gerda DissLuking and has finished antibiotic-levaquin- and no better.

## 2013-12-09 NOTE — Progress Notes (Signed)
Patient taught how to do peakflow, done with good effort, 350 was his score. RT will continue to monitor

## 2013-12-09 NOTE — ED Provider Notes (Signed)
CSN: [id number]     Arrival date & time 12/09/13  1557 History  This chart was scribed for Gerhard Munch, MD by Dorothey Baseman, ED Scribe. This patient was seen in room APA17/APA17 and the patient's care was started at 6:22 PM.    Chief Complaint  Patient presents with  . Cough   The history is provided by the patient. No language interpreter was used.   HPI Comments: TEXAS SOUTER is a 43 y.o. Male with a history of COPD and asthma who presents to the Emergency Department complaining of an intermittent, non-productive cough onset about 3 weeks ago. Patient reports an associated pain to the right-sided substernal region of the chest and some intermittent lightheadedness secondary to the cough onset about a week ago. He reports some diarrhea and states that he has been sleeping much more than usual since onset of symptoms. Patient also reports some shortness of breath secondary to his history of COPD and denies any recent changes. Patient states that he followed up with his PCP (Dr. Ardyth Gal) for these complaints and was started on a course of Levaquin, which he states he finished about 2 weeks ago, but that the cough has persisted. He denies fever, nausea, emesis, leg swelling, weight change, confusion, disorientation. Patient also has a history of DM. Patient is a current every day smoker, 2 PPD.   Past Medical History  Diagnosis Date  . Diabetes mellitus   . COPD (chronic obstructive pulmonary disease)   . Sleep apnea   . Asthma    Past Surgical History  Procedure Laterality Date  . Carpal tunnel release    . Shoulder arthrocentesis    . Hernia repair    . Vasectomy     Family History  Problem Relation Age of Onset  . Emphysema Maternal Grandfather   . Emphysema Maternal Grandmother   . Clotting disorder Maternal Grandmother    History  Substance Use Topics  . Smoking status: Current Every Day Smoker -- 2.00 packs/day for 20 years    Types: Cigarettes  . Smokeless tobacco:  Not on file  . Alcohol Use: No    Review of Systems  Constitutional: Negative for fever and unexpected weight change.       Per HPI, otherwise negative  HENT:       Per HPI, otherwise negative  Respiratory: Positive for cough and shortness of breath (baseline).        Per HPI, otherwise negative  Cardiovascular: Positive for chest pain. Negative for leg swelling.       Per HPI, otherwise negative  Gastrointestinal: Positive for diarrhea. Negative for nausea and vomiting.  Endocrine:       Negative aside from HPI  Genitourinary:       Neg aside from HPI   Musculoskeletal:       Per HPI, otherwise negative  Skin: Negative.   Neurological: Positive for light-headedness. Negative for syncope.  Psychiatric/Behavioral: Positive for sleep disturbance. Negative for confusion.    Allergies  Azithromycin; Erythromycin; Keflex; Metformin; Symbicort; and Doxycycline  Home Medications   Current Outpatient Rx  Name  Route  Sig  Dispense  Refill  . albuterol (PROVENTIL HFA;VENTOLIN HFA) 108 (90 BASE) MCG/ACT inhaler   Inhalation   Inhale 1-2 puffs into the lungs every 6 (six) hours as needed for wheezing.   1 Inhaler   5   . albuterol (PROVENTIL) (2.5 MG/3ML) 0.083% nebulizer solution   Nebulization   Take 2.5 mg by nebulization 4 (four)  times daily.         Marland Kitchen. aspirin 325 MG EC tablet   Oral   Take 325 mg by mouth 3 (three) times daily.          . benzonatate (TESSALON) 100 MG capsule   Oral   Take 1 capsule (100 mg total) by mouth 3 (three) times daily as needed for cough.   30 capsule   0   . citalopram (CELEXA) 40 MG tablet      TAKE ONE (1) TABLET EACH DAY   30 tablet   5   . glyBURIDE (DIABETA) 5 MG tablet      TAKE ONE TABLET BY MOUTH AFTER MEALS TWICE DAILY   180 tablet   0   . ipratropium (ATROVENT) 0.02 % nebulizer solution   Nebulization   Take 500 mcg by nebulization 4 (four) times daily.         Marland Kitchen. ipratropium (ATROVENT) 0.02 % nebulizer  solution      USE ONE VIAL IN NEBULIZER FOUR TIMES A DAY   300 mL   0   . LORazepam (ATIVAN) 1 MG tablet   Oral   Take 1 tablet (1 mg total) by mouth at bedtime.   30 tablet   2   . Multiple Vitamin (MULTIVITAMIN WITH MINERALS) TABS   Oral   Take 1 tablet by mouth every morning.         Marland Kitchen. oxyCODONE-acetaminophen (PERCOCET/ROXICET) 5-325 MG per tablet   Oral   Take 1 tablet by mouth 3 (three) times daily. pain   90 tablet   0     MAY FILL 01/25/14   . Testosterone (ANDROGEL PUMP) 20.25 MG/ACT (1.62%) GEL   Transdermal   Place onto the skin daily. 2 pumps/applicationns applied daily         . tiotropium (SPIRIVA HANDIHALER) 18 MCG inhalation capsule      Take 2 inhalations daily   60 capsule   11    Triage Vitals: BP 130/73  Pulse 91  Temp(Src) 98.2 F (36.8 C) (Oral)  Resp 20  Ht 5\' 10"  (1.778 m)  Wt 285 lb (129.275 kg)  BMI 40.89 kg/m2  SpO2 95%  Physical Exam  Nursing note and vitals reviewed. Constitutional: He is oriented to person, place, and time. He appears well-developed. No distress.  HENT:  Head: Normocephalic and atraumatic.  Eyes: Conjunctivae and EOM are normal.  Cardiovascular: Normal rate, regular rhythm and normal heart sounds.   Pulmonary/Chest: Effort normal and breath sounds normal. No stridor. No respiratory distress.  Abdominal: He exhibits no distension.  Musculoskeletal: He exhibits no edema.  Neurological: He is alert and oriented to person, place, and time.  Skin: Skin is warm and dry.  Psychiatric: He has a normal mood and affect.    ED Course  Procedures (including critical care time)  DIAGNOSTIC STUDIES: Oxygen Saturation is 95% on room air, adequate by my interpretation.    COORDINATION OF CARE: 6:27 PM- Advised patient to quit smoking and discussed some cessation techniques. Ordered blood labs and a chest x-ray. Will order a peak flow study. Will order a breathing treatment, prednisone, and an injection of Toradol to  manage symptoms. Discussed treatment plan with patient at bedside and patient verbalized agreement.   8:28 PM- Patient reports that he feels much better after receiving the medications. Discussed that lab and imaging results were normal. Discussed peak flow results and advised him to check his peak flow at home. Will discharge  patient with steroids to manage symptoms. Discussed treatment plan with patient at bedside and patient verbalized agreement.   Labs Review Labs Reviewed  CBC WITH DIFFERENTIAL - Abnormal; Notable for the following:    Monocytes Relative 13 (*)    All other components within normal limits  COMPREHENSIVE METABOLIC PANEL - Abnormal; Notable for the following:    Glucose, Bld 175 (*)    All other components within normal limits  TROPONIN I   Imaging Review Dg Chest 2 View  12/09/2013   CLINICAL DATA:  Cough for 3 weeks  EXAM: CHEST  2 VIEW  COMPARISON:  04/05/2013  FINDINGS: The heart size and mediastinal contours are within normal limits. Both lungs are clear. The visualized skeletal structures are unremarkable.  IMPRESSION: No active cardiopulmonary disease.   Electronically Signed   By: Signa Kell M.D.   On: 12/09/2013 16:40    EKG Interpretation    Date/Time:  Thursday December 09 2013 16:21:44 EST Ventricular Rate:  90 PR Interval:  120 QRS Duration: 96 QT Interval:  356 QTC Calculation: 435 R Axis:   42 Text Interpretation:  Normal sinus rhythm Normal ECG When compared with ECG of 05-Apr-2013 21:44, No significant change was found Sinus rhythm Normal ECG Confirmed by Gerhard Munch  MD [phone]) on 12/09/2013 4:28:40 PM           I spent five minutes discussing smoking cessation and methods to attain this with the physician.   MDM   1. Dyspnea    Patient presents with concerns of ongoing dyspnea, chest pain.  Patient has a notable history of COPD, continues to smoke.  On exam patient is awake and alert, and in no distress.  Present reproducible  tenderness to palpation about the anterior chest.  Patient's symptoms improved while here.  Patient's because noticeably diminished compared to age controls.  I had a lengthy conversation with him that smoking cessation.  He is discharged in stable condition to follow up with primary care after initiation of steroid therapy.    Gerhard Munch, MD 12/09/13 2040

## 2013-12-13 ENCOUNTER — Other Ambulatory Visit: Payer: Self-pay | Admitting: Family Medicine

## 2013-12-17 ENCOUNTER — Encounter: Payer: Self-pay | Admitting: Internal Medicine

## 2013-12-17 ENCOUNTER — Ambulatory Visit (INDEPENDENT_AMBULATORY_CARE_PROVIDER_SITE_OTHER): Payer: BC Managed Care – PPO | Admitting: Internal Medicine

## 2013-12-17 VITALS — BP 112/82 | HR 96 | Temp 98.4°F | Ht 70.0 in | Wt 295.0 lb

## 2013-12-17 DIAGNOSIS — R059 Cough, unspecified: Secondary | ICD-10-CM

## 2013-12-17 DIAGNOSIS — F17200 Nicotine dependence, unspecified, uncomplicated: Secondary | ICD-10-CM

## 2013-12-17 DIAGNOSIS — J449 Chronic obstructive pulmonary disease, unspecified: Secondary | ICD-10-CM

## 2013-12-17 DIAGNOSIS — R05 Cough: Secondary | ICD-10-CM

## 2013-12-17 MED ORDER — OXYCODONE-ACETAMINOPHEN 7.5-325 MG PO TABS: 1.0000 | ORAL_TABLET | ORAL | Status: DC | PRN

## 2013-12-17 MED ORDER — PANTOPRAZOLE SODIUM 40 MG PO TBEC: 40.0000 mg | DELAYED_RELEASE_TABLET | Freq: Every day | ORAL | Status: DC

## 2013-12-17 MED ORDER — FLUTTER DEVI: Status: DC

## 2013-12-17 MED ORDER — PREDNISONE 10 MG PO TABS: ORAL_TABLET | ORAL | Status: DC

## 2013-12-17 MED ORDER — FAMOTIDINE 20 MG PO TABS: ORAL_TABLET | ORAL | Status: DC

## 2013-12-17 NOTE — Patient Instructions (Addendum)
For Cough - use the flutter valve as much as you can plus mucinex dm 1200 mg every 12 hours as needed supplement with percocet up to 2 every 4hours  For Breathing  Only use your duoneb  rescue medication to be used if you can't catch your breath by resting or doing a relaxed purse lip breathing pattern.  - The less you use it, the better it will work when you need it. - Ok to use up to    every 4 hours if you must   Prednisone 10 mg take  4 each am x 2 days,   2 each am x 2 days,  1 each am x 2 days and stop   Pantoprazole (protonix) 40 mg   Take 30-60 min before first meal of the day and Pepcid 20 mg one bedtime until return to office - this is the best way to tell whether stomach acid is contributing to your problem.   GERD (REFLUX)  is an extremely common cause of respiratory symptoms, many times with no significant heartburn at all.    It can be treated with medication, but also with lifestyle changes including avoidance of late meals, excessive alcohol, smoking cessation, and avoid fatty foods, chocolate, peppermint, colas, red wine, and acidic juices such as orange juice.  NO MINT OR MENTHOL PRODUCTS SO NO COUGH DROPS  USE SUGARLESS CANDY INSTEAD (jolley ranchers or Stover's)  NO OIL BASED VITAMINS - use powdered substitutes.  Please schedule a follow up office visit in 2 weeks, sooner if needed with all meds in hand

## 2013-12-17 NOTE — Progress Notes (Signed)
Subjective:    Patient ID: Joseph ForthJerry R Louissaint, male    DOB: [birth year]   MRN: [id number]  HPI  IOV 04/10/12  43 year old male.  reports that he has been smoking Cigarettes.  He has a 40 pack-year smoking history; 2 ppd since age 43. He does not have any smokeless tobacco history on file. Body mass index is 39.97 kg/(m^2) (40# weight gain in 4 years) [name],[name], MD, MD is pcp. At basline has dx of chronic percocet use, OSA on CPAP, Morbid obesity and COPD since early 2002 (has hx of spirometry back then but result NA). Also, hx of pyrethrin inhalation through work in [employer] 1992 - 1999. Currently unemployed. Has had dx of pna in past. Also reports strong hx of breathing issues in family.   In April 2013 reports he had pneumonia apparently. Went to [facility] and found some abnormality (nodule based on review of ER notes) on CXR that resulted in CT Angio 03/07/12 same visit. PE ruled out but found to have mediastinal nodes but the cxr nodule was not there. Apparently ER doctor said it was lymphoma and asked to see an oncologist. But primary care doctor felt that was over zealous and referred patient here. He prefers conservative mgmt to nodes if clinical suspicion for cancer is low  Currently, feels baseline which is lack of energy (spent most of days yesterday in bed), esp in pollen, chronic dyspnea on exertion for 180 feet relieved by rest  But slowly progressive since 2002 for which he uses albuterol prn on average 4 times a day.  Walkt test 185 feet x 3 laps: no desaturation  He smokes at baseline; trying quit, prior intolerance (dreams) with chantix.   Only baseline mild chronic cough with associtaed wheeze that is occasional.. Does have some B symptoms like nocturnal diaphoresis (soaks pillows). Denies weight loss.   CT ANGIOGRAPHY CHEST  Comparison: 03/07/2012  An abnormal right lower paratracheal node has a short axis diameter  of 1.6 cm. An abnormal AP window lymph node has a short axis   diameter of 1.4 cm. Mild bilateral hilar and infrahilar adenopathy  noted. Scattered small axillary lymph nodes are present.  A subcarinal node has a short axis diameter of 1.5 cm.   No filling defect is identified in the pulmonary arterial tree to  suggest pulmonary embolus.   There is diffuse steatosis of the visualized portion of the liver.   No pericardial effusion noted. No aortic dissection is observed.   Airway thickening noted. In the area of previous concern for right  basilar pulmonary nodule, no nodule is seen. This may have  represented a confluence of vascular shadows or a skin fold.  Old, healed right ninth rib fracture noted. Mild thoracic  spondylosis is present.  I Original Report Authenticated By: Dellia CloudWALTER D. LIEBKEMANN, M.D   #MEdiastinal node  - suspicion for cancer is low  - please do blood test for ACE level to help give us some clue if this could be due to sarcoid or not  - Please have PET scan in 2 months for followup of the mediastinal nodes  #Smoking  - glad you are working on quitting  #COPD history and shortness of breath  - have full pft at followup in 2 months  - nurse will walk you for oxygen levels now  #FOllowup  - 2 months with PET scan and full PFT at followup    OV 08/14/2012   Mediastinal nodes: PET scan  06/10/12 shows very low uptake in the mediastinal nodes. Suspicion for cancer is very low. Lymph nodes include 13 mm right lower paratracheal lymph node and 10 mm prevascular lymph node. ACE level 04/10/12  - is in 20s and normal    Smoking: switched to e-cigs with  Nicotine to help quit. Now 50% e-cigs and 1 pack regular cigs which is 50% down   Still dyspneic: 1 flught of steps and better with rest and with albuterol and atrovent prn. Dyspne also worse in winter and cooler weathers. Rates it as moderate. STable since last visit to slightly worse. thre is some associtaed cough. No asociated weight loss. COugh is moderate and worst early  in morning and in cooler temperatues.. Denies sinus drainage. PFts show mixed picture - fvc 3.4L/69%, fev1 2.9/76%, Ratio 85 but 12% bd respose. TLC 5.7/83%. DLCO 23.7/69% rec rec #MEdiastinal node  - suspicion for cancer is low  - you will need CT chest in a year; we will schedule it later  #Smoking  - glad you are working on quitting  #Shortness of breath - this is due to asthma/copd and weight  - please start QVAR 2 puff twice daily - take sample and show technique and take prescription too  - focus on weight loss through the low glycemic diet; take diet sheet from [place]  #FOllowup  - 3 months with spirometry at followup  - flu shot and pneumovax through PMD as discussed   12/17/2013  acute ov/Cornellius Kropp still active smoker re: recurrent pattern of coughing x a decade intermittent / out of control since nov 2014/ can't take symbicort makes it worse / on percocet one tid at baseline  Chief Complaint  Patient presents with  . Acute Visit    MR pt.  C/o mostly nonprod cough with some thick white mucous, coughing until he almost passes out.  Went to [employer] last week.    duoneb daily  Sometime skips the doses later in the day but always uses the am dose, sputum to purulent. Mostly sob with cough/ otherwise breaths ok  Last neb was 4 h prior to OV    No obvious day to day or daytime variabilty or assoc chronic cough or cp or chest tightness, subjective wheeze overt sinus or hb symptoms. No unusual exp hx or h/o childhood pna/ asthma or knowledge of premature birth.  Sleeping ok without nocturnal  or early am exacerbation  of respiratory  c/o's or need for noct saba. Also denies any obvious fluctuation of symptoms with weather or environmental changes or other aggravating or alleviating factors except as outlined above   Current Medications, Allergies, Complete Past Medical History, Past Surgical History, Family History, and Social History were reviewed in [employer]  record.  ROS  The following are not active complaints unless bolded sore throat, dysphagia, dental problems, itching, sneezing,  nasal congestion or excess/ purulent secretions, ear ache,   fever, chills, sweats, unintended wt loss, pleuritic or exertional cp, hemoptysis,  orthopnea pnd or leg swelling, presyncope, palpitations, heartburn, abdominal pain, anorexia, nausea, vomiting, diarrhea  or change in bowel or urinary habits, change in stools or urine, dysuria,hematuria,  rash, arthralgias, visual complaints, headache, numbness weakness or ataxia or problems with walking or coordination,  change in mood/affect or memory.        Objective:   Physical Exam  amb obese bm nad  With harsh barking cough  Wt Readings from Last 3 Encounters:  12/17/13 295 lb (133.811  kg)  12/09/13 285 lb (129.275 kg)  11/18/13 294 lb 6.4 oz (133.539 kg)      HEENT: nl dentition, turbinates, and orophanx. Nl external ear canals without cough reflex   NECK :  without JVD/Nodes/TM/ nl carotid upstrokes bilaterally   LUNGS: no acc muscle use, clear to A and P bilaterally without cough on insp or exp maneuvers   CV:  RRR  no s3 or murmur or increase in P2, no edema   ABD:  soft and nontender with nl excursion in the supine position. No bruits or organomegaly, bowel sounds nl  MS:  warm without deformities, calf tenderness, cyanosis or clubbing  SKIN: warm and dry without lesions    NEURO:  alert, approp, no deficits          12/09/13  cxr No active cardiopulmonary disease.             Assessment & Plan:

## 2013-12-19 DIAGNOSIS — J4530 Mild persistent asthma, uncomplicated: Secondary | ICD-10-CM | POA: Insufficient documentation

## 2013-12-19 DIAGNOSIS — J45909 Unspecified asthma, uncomplicated: Secondary | ICD-10-CM | POA: Insufficient documentation

## 2013-12-19 NOTE — Assessment & Plan Note (Addendum)
Last set of pft's from 06/02/12 > no significant airflow obst so ok to use duoneb just prn sob, not for the cough which I doubt is copd related

## 2013-12-19 NOTE — Assessment & Plan Note (Signed)
The most common causes of chronic cough in immunocompetent adults include the following: upper airway cough syndrome (UACS), previously referred to as postnasal drip syndrome (PNDS), which is caused by variety of rhinosinus conditions; (2) asthma; (3) GERD; (4) chronic bronchitis from cigarette smoking or other inhaled environmental irritants; (5) nonasthmatic eosinophilic bronchitis; and (6) bronchiectasis.   These conditions, singly or in combination, have accounted for up to 94% of the causes of chronic cough in prospective studies.   Other conditions have constituted no >6% of the causes in prospective studies These have included bronchogenic carcinoma, chronic interstitial pneumonia, sarcoidosis, left ventricular failure, ACEI-induced cough, and aspiration from a condition associated with pharyngeal dysfunction.    Chronic cough is often simultaneously caused by more than one condition. A single cause has been found from 38 to 82% of the time, multiple causes from 18 to 62%. Multiply caused cough has been the result of three diseases up to 42% of the time.      This is not typical at all for copd but much more suggestive of  Classic Upper airway cough syndrome, so named because it's frequently impossible to sort out how much is  CR/sinusitis with freq throat clearing (which can be related to primary GERD)   vs  causing  secondary (" extra esophageal")  GERD from wide swings in gastric pressure that occur with throat clearing, often  promoting self use of mint and menthol lozenges that reduce the lower esophageal sphincter tone and exacerbate the problem further in a cyclical fashion.   These are the same pts (now being labeled as having "irritable larynx syndrome" by some cough centers) who not infrequently have a history of having failed to tolerate ace inhibitors,  dry powder inhalers or biphosphonates or report having atypical reflux symptoms that don't respond to standard doses of PPI , and are  easily confused as having aecopd or asthma flares by even experienced allergists/ pulmonologists.   For now try to eliminate cyclical cough then regroup in 2 weeks

## 2013-12-19 NOTE — Assessment & Plan Note (Signed)
>   3 min    reviewed the Flethcher curve with patient that basically indicates  if you quit smoking when your best day FEV1 is still well preserved (as his clearly is)  it is highly unlikely you will progress to severe disease and informed the patient there was no medication on the market that has proven to change the curve or the likelihood of progression.  Therefore stopping smoking and maintaining abstinence is the most important aspect of care, not choice of inhalers or for that matter, doctors.

## 2013-12-25 ENCOUNTER — Other Ambulatory Visit: Payer: Self-pay | Admitting: Family Medicine

## 2013-12-27 NOTE — Telephone Encounter (Signed)
Ok plus 2 monthly ref 

## 2013-12-27 NOTE — Telephone Encounter (Signed)
Last office visit 11-18-13

## 2013-12-29 ENCOUNTER — Telehealth: Payer: Self-pay | Admitting: Family Medicine

## 2013-12-29 NOTE — Telephone Encounter (Signed)
Patient says that he had two more Rx's for the pain medication that Dr Brett CanalesSteve prescribed him, but due to the fact that [facility]Wert [facility] prescribed him percocet 7.5 mg, it has voided the Rx's that were on file from our office. He says he needs these two wrote again so he can turn them in to the pharmacy.

## 2013-12-29 NOTE — Telephone Encounter (Signed)
absoloutely this will not be even considered until pt has face to face visit with me as why he accepted this oxycodone rx from the pulm when we had already prescribed (Not thur)

## 2013-12-30 NOTE — Telephone Encounter (Signed)
Left message on voicemail to return call.

## 2013-12-30 NOTE — Telephone Encounter (Signed)
Patient states that he will call back at the beginning of next week to schedule an office visit.

## 2013-12-31 ENCOUNTER — Ambulatory Visit (INDEPENDENT_AMBULATORY_CARE_PROVIDER_SITE_OTHER): Payer: BC Managed Care – PPO | Admitting: Internal Medicine

## 2013-12-31 ENCOUNTER — Encounter: Payer: Self-pay | Admitting: Internal Medicine

## 2013-12-31 VITALS — BP 110/70 | HR 90 | Temp 98.1°F | Ht 70.0 in | Wt 294.0 lb

## 2013-12-31 DIAGNOSIS — R059 Cough, unspecified: Secondary | ICD-10-CM

## 2013-12-31 DIAGNOSIS — F17200 Nicotine dependence, unspecified, uncomplicated: Secondary | ICD-10-CM

## 2013-12-31 DIAGNOSIS — R05 Cough: Secondary | ICD-10-CM

## 2013-12-31 MED ORDER — FAMOTIDINE 20 MG PO TABS: ORAL_TABLET | ORAL | Status: DC

## 2013-12-31 MED ORDER — LANSOPRAZOLE 15 MG PO CPDR: DELAYED_RELEASE_CAPSULE | ORAL | Status: DC

## 2013-12-31 MED ORDER — OXYCODONE-ACETAMINOPHEN 7.5-325 MG PO TABS: 1.0000 | ORAL_TABLET | ORAL | Status: DC | PRN

## 2013-12-31 MED ORDER — MOMETASONE FURO-FORMOTEROL FUM 100-5 MCG/ACT IN AERO: INHALATION_SPRAY | RESPIRATORY_TRACT | Status: DC

## 2013-12-31 MED ORDER — PREDNISONE 10 MG PO TABS: ORAL_TABLET | ORAL | Status: DC

## 2013-12-31 NOTE — Progress Notes (Signed)
Subjective:    Patient ID: Joseph [name], male    DOB: [birth year]   MRN: [id number]  Brief patient profile:  [age] yowm active smoker without significant airflow obst by PFTs 06/02/12    History of Present Illness  IOV 04/10/12  43 year old male.  reports that he has been smoking Cigarettes.  He has a 40 pack-year smoking history; 2 ppd since age 43. He does not have any smokeless tobacco history on file. Body mass index is 39.97 kg/(m^2) (40# weight gain in 4 years) Joseph [name],Joseph S, MD, Joseph [name] is pcp. At basline has dx of chronic percocet use, OSA on CPAP, Morbid obesity and COPD since early 2002 (has hx of spirometry back then but result NA). Also, hx of pyrethrin inhalation through work in [employer] 1992 - 1999. Currently unemployed. Has had dx of pna in past. Also reports strong hx of breathing issues in family.   In April 2013 reports he had pneumonia apparently. Went to [facility] and found some abnormality (nodule based on review of ER notes) on CXR that resulted in CT Angio 03/07/12 same visit. PE ruled out but found to have mediastinal nodes but the cxr nodule was not there. Apparently ER doctor said it was lymphoma and asked to see an oncologist. But primary care doctor felt that was over zealous and referred patient here. He prefers conservative mgmt to nodes if clinical suspicion for cancer is low  Currently, feels baseline which is lack of energy (spent most of days yesterday in bed), esp in pollen, chronic dyspnea on exertion for 180 feet relieved by rest  But slowly progressive since 2002 for which he uses albuterol prn on average 4 times a day.  Walkt test 185 feet x 3 laps: no desaturation  He smokes at baseline; trying quit, prior intolerance (dreams) with chantix.   Only baseline mild chronic cough with associtaed wheeze that is occasional.. Does have some B symptoms like nocturnal diaphoresis (soaks pillows). Denies weight loss.   CT ANGIOGRAPHY CHEST  Comparison: 03/07/2012  An abnormal  right lower paratracheal node has a short axis diameter  of 1.6 cm. An abnormal AP window lymph node has a short axis  diameter of 1.4 cm. Mild bilateral hilar and infrahilar adenopathy  noted. Scattered small axillary lymph nodes are present.  A subcarinal node has a short axis diameter of 1.5 cm.   No filling defect is identified in the pulmonary arterial tree to  suggest pulmonary embolus.   There is diffuse steatosis of the visualized portion of the liver.   No pericardial effusion noted. No aortic dissection is observed.   Airway thickening noted. In the area of previous concern for right  basilar pulmonary nodule, no nodule is seen. This may have  represented a confluence of vascular shadows or a skin fold.  Old, healed right ninth rib fracture noted. Mild thoracic  spondylosis is present.  I Original Report Authenticated By: Dellia CloudWALTER D. LIEBKEMANN, M.D   #MEdiastinal node  - suspicion for cancer is low  - please do blood test for ACE level to help give us some clue if this could be due to sarcoid or not  - Please have PET scan in 2 months for followup of the mediastinal nodes  #Smoking  - glad you are working on quitting  #COPD history and shortness of breath  - have full pft at followup in 2 months  - nurse will walk you for oxygen levels now  #FOllowup  -  2 months with PET scan and full PFT at followup    OV 08/14/2012   Mediastinal nodes: PET scan 06/10/12 shows very low uptake in the mediastinal nodes. Suspicion for cancer is very low. Lymph nodes include 13 mm right lower paratracheal lymph node and 10 mm prevascular lymph node. ACE level 04/10/12  - is in 20s and normal    Smoking: switched to e-cigs with  Nicotine to help quit. Now 50% e-cigs and 1 pack regular cigs which is 50% down   Still dyspneic: 1 flught of steps and better with rest and with albuterol and atrovent prn. Dyspne also worse in winter and cooler weathers. Rates it as moderate. STable since  last visit to slightly worse. thre is some associtaed cough. No asociated weight loss. COugh is moderate and worst early in morning and in cooler temperatues.. Denies sinus drainage. PFts show mixed picture - fvc 3.4L/69%, fev1 2.9/76%, Ratio 85 but 12% bd respose. TLC 5.7/83%. DLCO 23.7/69% rec rec #MEdiastinal node  - suspicion for cancer is low  - you will need CT chest in a year; we will schedule it later  #Smoking  - glad you are working on quitting  #Shortness of breath - this is due to asthma/copd and weight  - please start QVAR 2 puff twice daily - take sample and show technique and take prescription too  - focus on weight loss through the low glycemic diet; take diet sheet from [place]  #FOllowup  - 3 months with spirometry at followup  - flu shot and pneumovax through PMD as discussed   12/17/2013  acute ov/Joseph [name] still active smoker re: recurrent pattern of coughing x a decade intermittent / out of control since nov 2014/ can't take symbicort makes it worse / on percocet one tid at baseline  Chief Complaint  Patient presents with  . Acute Visit    MR pt.  C/o mostly nonprod cough with some thick white mucous, coughing until he almost passes out.  Went to [facility] last week.    duoneb daily  Sometime skips the doses later in the day but always uses the am dose, sputum to purulent. Mostly sob with cough/ otherwise breaths ok Last neb was 4 h prior to OV   rec For Cough - use the flutter valve as much as you can plus mucinex dm 1200 mg every 12 hours as needed supplement with percocet up to 2 every 4hours For Breathing  Only use your duoneb  rescue medication . - Ok to use up to    every 4 hours if you must  Prednisone 10 mg take  4 each am x 2 days,   2 each am x 2 days,  1 each am x 2 days and stop  Pantoprazole (protonix) 40 mg   Take 30-60 min before first meal of the day and Pepcid 20 mg one bedtime until return to office - this is the best way to tell whether stomach acid is  contributing to your problem.  GERD  Diet . Please schedule a follow up office visit in 2 weeks, sooner if needed with all meds in hand   12/31/2013 f/u ov/Joseph [name] re: last duoneb  2h prior to OV = takes  First thing in am / did not bring all meds Chief Complaint  Patient presents with  . Follow-up    Pt reports cough had improved while on pred, but is worse since stopped med. No new co'[name] today. Out of percocet.  No obvious patterns in  day to day or daytime variabilty or assoc chronic cough or cp or chest tightness, subjective wheeze overt sinus or hb symptoms. No unusual exp hx or h/o childhood pna/ asthma or knowledge of premature birth.  Sleeping ok without nocturnal  or early am exacerbation  of respiratory  c/o'[name] or need for noct saba. Also denies any obvious fluctuation of symptoms with weather or environmental changes or other aggravating or alleviating factors except as outlined above   Current Medications, Allergies, Complete Past Medical History, Past Surgical History, Family History, and Social History were reviewed in [employer] record.  ROS  The following are not active complaints unless bolded sore throat, dysphagia, dental problems, itching, sneezing,  nasal congestion or excess/ purulent secretions, ear ache,   fever, chills, sweats, unintended wt loss, pleuritic or exertional cp, hemoptysis,  orthopnea pnd or leg swelling, presyncope, palpitations, heartburn, abdominal pain, anorexia, nausea, vomiting, diarrhea  or change in bowel or urinary habits, change in stools or urine, dysuria,hematuria,  rash, arthralgias, visual complaints, headache, numbness weakness or ataxia or problems with walking or coordination,  change in mood/affect or memory.                 Objective:   Physical Exam  amb obese bm nad  With harsh barking cough  12/31/2013       294  Wt Readings from Last 3 Encounters:  12/17/13 295 lb (133.811 kg)  12/09/13 285 lb  (129.275 kg)  11/18/13 294 lb 6.4 oz (133.539 kg)      HEENT: nl dentition, turbinates, and orophanx. Nl external ear canals without cough reflex   NECK :  without JVD/Nodes/TM/ nl carotid upstrokes bilaterally   LUNGS: no acc muscle use, clear to A and P bilaterally without cough on insp or exp maneuvers   CV:  RRR  no s3 or murmur or increase in P2, no edema   ABD:  soft and nontender with nl excursion in the supine position. No bruits or organomegaly, bowel sounds nl  MS:  warm without deformities, calf tenderness, cyanosis or clubbing  SKIN: warm and dry without lesions    NEURO:  alert, approp, no deficits          12/09/13  cxr No active cardiopulmonary disease.             Assessment & Plan:

## 2013-12-31 NOTE — Patient Instructions (Addendum)
Plan A =  Automatic = dulera 100 Take 2 puffs first thing in am and then another 2 puffs about 12 hours later.   Only use your albuterol(Proair) as Plan B = Backup  rescue medication to be used if you can't catch your breath by resting or doing a relaxed purse lip breathing pattern.  - The less you use it, the better it will work when you need it. - Ok to use up to 2 puffs  every 4 hours if you must but call for immediate appointment if use goes up over your usual need - Don't leave home without it !!  (think of it like the spare tire for your car)  - if proair not effective then use the nebulizer up to every 4 hours (Plan C)  See Tammy NP w/in 2 weeks with all your medications, even over the counter meds, separated in two separate bags, the ones you take no matter what vs the ones you stop once you feel better and take only as needed when you feel you need them.   Tammy  will generate for you a new user friendly medication calendar that will put us all on the same page re: your medication use.     Without this process, it simply isn't possible to assure that we are providing  your outpatient care  with  the attention to detail we feel you deserve.   If we cannot assure that you're getting that kind of care,  then we cannot manage your problem effectively from this clinic.  Once you have seen Tammy and we are sure that we're all on the same page with your medication use she will arrange follow up with me.

## 2014-01-02 NOTE — Assessment & Plan Note (Signed)
Lack of cough resolution on a verified empirical regimen could mean an alternative diagnosis, persistence of the disease state (eg sinusitis or bronchiectasis) , or inadequacy of currently available therapy (eg no medical rx available for non-acid gerd)   The standardized cough guidelines published in Chest by [employer]ich[employer]win in 2006 are still the best available and consist of a multiple step process (up to 12!) , not a single office visit,  and are intended  to address this problem logically,  with an alogrithm dependent on response to empiric treatment at  each progressive step  to determine a specific diagnosis with  minimal addtional testing needed. Therefore if adherence is an issue or can't be accurately verified,  it's very unlikely the standard evaluation and treatment will be successful here.    Furthermore, response to therapy (other than acute cough suppression, which should only be used short term with avoidance of narcotic containing cough syrups if possible), can be a gradual process for which the patient may perceive immediate benefit.  Unlike going to an eye doctor where the best perscription is almost always the first one and is immediately effective, this is almost never the case in the management of chronic cough syndromes. Therefore the patient needs to commit up front to consistently adhere to recommendations  for up to 6 weeks of therapy directed at the likely underlying problem(s) before the response can be reasonably evaluated.   Therefore we need to start with a trust but verify approach and do full/ complete med reconciliation before further steps   To keep things simple, I have asked the patient to first separate medicines that are perceived as maintenance, that is to be taken daily "no matter what", from those medicines that are taken on only on an as-needed basis and I have given the patient examples of both, and then return to see our NP to generate a  detailed  medication  calendar which should be followed until the next physician sees the patient and updates it.

## 2014-01-02 NOTE — Assessment & Plan Note (Addendum)
without significant airflow obst by PFTs 06/02/12   I took an extended  opportunity (> 3 min ) with this patient to outline the consequences of continued cigarette use  in airway disorders based on all the data we have from the multiple national lung health studies (perfomed over decades at millions of dollars in cost)  indicating that smoking cessation, not choice of inhalers or physicians, is the most important aspect of care.

## 2014-01-12 ENCOUNTER — Telehealth: Payer: Self-pay | Admitting: Family Medicine

## 2014-01-12 NOTE — Telephone Encounter (Signed)
See last phone message. Answer is still NO. Will require face to face visit with me, technically he broke pain contract without notifying us and I will not refill over the phone. NO ov today

## 2014-01-12 NOTE — Telephone Encounter (Signed)
Notified patient answer is still NO. Will require face to face visit with me, technically he broke pain contract without notifying us and he will not refill over the phone. Patient verbalized understanding. Has appointment on Friday at 10:45 am.

## 2014-01-12 NOTE — Telephone Encounter (Signed)
Patient is completely out of his percocet and needs a refill. He said he will make an appointment to talk about pain contract.

## 2014-01-13 ENCOUNTER — Other Ambulatory Visit: Payer: Self-pay | Admitting: Family Medicine

## 2014-01-14 ENCOUNTER — Ambulatory Visit (INDEPENDENT_AMBULATORY_CARE_PROVIDER_SITE_OTHER): Payer: BC Managed Care – PPO | Admitting: Adult Health

## 2014-01-14 ENCOUNTER — Encounter: Payer: Self-pay | Admitting: Adult Health

## 2014-01-14 ENCOUNTER — Ambulatory Visit (INDEPENDENT_AMBULATORY_CARE_PROVIDER_SITE_OTHER): Payer: BC Managed Care – PPO | Admitting: Family Medicine

## 2014-01-14 ENCOUNTER — Encounter: Payer: Self-pay | Admitting: Family Medicine

## 2014-01-14 VITALS — BP 114/90 | Ht 70.0 in | Wt 299.5 lb

## 2014-01-14 VITALS — BP 102/74 | HR 79 | Temp 98.4°F | Ht 70.0 in | Wt 294.0 lb

## 2014-01-14 DIAGNOSIS — R059 Cough, unspecified: Secondary | ICD-10-CM

## 2014-01-14 DIAGNOSIS — G894 Chronic pain syndrome: Secondary | ICD-10-CM

## 2014-01-14 DIAGNOSIS — R05 Cough: Secondary | ICD-10-CM

## 2014-01-14 MED ORDER — OXYCODONE-ACETAMINOPHEN 5-325 MG PO TABS: 1.0000 | ORAL_TABLET | Freq: Three times a day (TID) | ORAL | Status: DC | PRN

## 2014-01-14 MED ORDER — MOMETASONE FURO-FORMOTEROL FUM 100-5 MCG/ACT IN AERO: INHALATION_SPRAY | RESPIRATORY_TRACT | Status: DC

## 2014-01-14 NOTE — Progress Notes (Signed)
   Subjective:    Patient ID: Ginny ForthJerry R Humber, male    DOB: 05-14-1971, 43 y.o.   MRN: [id number]  HPI Patient is here today for a follow up visit on his chronic pain medication. He recently was prescribed percocet 7.5 mg by his pulmonologist for cough. He states that this cancelled his percocet 5 mg prescription he was receiving from our office. He is here to have that prescription reinstated.   Patient goes into a rather length the discussion as to why he started Percocet under the direction of the pulmonary Dr. This despite the fact that high-powered he had him on Percocet for his chronic pain.  The patient is aware he signed a pain contract which requires he notify us of all additional narcotics he takes elsewhere. Also it requires that he inform any other condition of the medication that he is on. It is unclear from Dr. Shona SimpsonWertz notes whether he Patient was started on oxycodone. The patient claims that Dr. Shona SimpsonWertz knew.  Patient reports ongoing considerable prominence chronic back pain and reports he needs the medications in order to function.  Patient also notes no excessive daytime drowsiness from his usual regimen of pain medication.  Review of Systems No chest pain no back pain no abdominal pain no change in bowel habits    Objective:   Physical Exam  Alert no apparent distress vitals stable. Lungs clear. Heart regular in rhythm. Back pain full to percussion. No true straight leg raise  All pulmonary notes reviewed and red at length during today's visit.      Assessment & Plan:  Impression chronic pain with discrepancy involving patient's use of medications from 2 sources. He claims that he stopped R. oxycodone once starting the other physicians. This appears to be accurate. We reviewed the importance of just one physician providing oxycodone. 25 minutes spent most in discussion. Plan will write to months worth. At that time we'll return for evaluation WSL

## 2014-01-14 NOTE — Progress Notes (Signed)
Subjective:    Patient ID: Joseph ForthJerry R Provencio, male    DOB: 07-31-1971   MRN: [id number]  Brief patient profile:  [age] yowm active smoker without significant airflow obst by PFTs 06/02/12    History of Present Illness  IOV 04/10/12  43 year old male.  reports that he has been smoking Cigarettes.  He has a 40 pack-year smoking history; 2 ppd since age 43. He does not have any smokeless tobacco history on file. Body mass index is 39.97 kg/(m^2) (40# weight gain in 4 years) [name],[name], MD, MD is pcp. At basline has dx of chronic percocet use, OSA on CPAP, Morbid obesity and COPD since early 2002 (has hx of spirometry back then but result NA). Also, hx of pyrethrin inhalation through work in [employer] 1992 - 1999. Currently unemployed. Has had dx of pna in past. Also reports strong hx of breathing issues in family.   In April 2013 reports he had pneumonia apparently. [facility] and found some abnormality (nodule based on review of ER notes) on CXR that resulted in CT Angio 03/07/12 same visit. PE ruled out but found to have mediastinal nodes but the cxr nodule was not there. Apparently ER doctor said it was lymphoma and asked to see an oncologist. But primary care doctor felt that was over zealous and referred patient here. He prefers conservative mgmt to nodes if clinical suspicion for cancer is low  Currently, feels baseline which is lack of energy (spent most of days yesterday in bed), esp in pollen, chronic dyspnea on exertion for 180 feet relieved by rest  But slowly progressive since 2002 for which he uses albuterol prn on average 4 times a day.  Walkt test 185 feet x 3 laps: no desaturation  He smokes at baseline; trying quit, prior intolerance (dreams) with chantix.   Only baseline mild chronic cough with associtaed wheeze that is occasional.. Does have some B symptoms like nocturnal diaphoresis (soaks pillows). Denies weight loss.   CT ANGIOGRAPHY CHEST  Comparison: 03/07/2012  An abnormal  right lower paratracheal node has a short axis diameter  of 1.6 cm. An abnormal AP window lymph node has a short axis  diameter of 1.4 cm. Mild bilateral hilar and infrahilar adenopathy  noted. Scattered small axillary lymph nodes are present.  A subcarinal node has a short axis diameter of 1.5 cm.   No filling defect is identified in the pulmonary arterial tree to  suggest pulmonary embolus.   There is diffuse steatosis of the visualized portion of the liver.   No pericardial effusion noted. No aortic dissection is observed.   Airway thickening noted. In the area of previous concern for right  basilar pulmonary nodule, no nodule is seen. This may have  represented a confluence of vascular shadows or a skin fold.  Old, healed right ninth rib fracture noted. Mild thoracic  spondylosis is present.  I Original Report Authenticated By: Dellia CloudWALTER D. LIEBKEMANN, M.D   #MEdiastinal node  - suspicion for cancer is low  - please do blood test for ACE level to help give us some clue if this could be due to sarcoid or not  - Please have PET scan in 2 months for followup of the mediastinal nodes  #Smoking  - glad you are working on quitting  #COPD history and shortness of breath  - have full pft at followup in 2 months  - nurse will walk you for oxygen levels now  #FOllowup  -  2 months with PET scan and full PFT at followup    OV 08/14/2012   Mediastinal nodes: PET scan 06/10/12 shows very low uptake in the mediastinal nodes. Suspicion for cancer is very low. Lymph nodes include 13 mm right lower paratracheal lymph node and 10 mm prevascular lymph node. ACE level 04/10/12  - is in 20s and normal    Smoking: switched to e-cigs with  Nicotine to help quit. Now 50% e-cigs and 1 pack regular cigs which is 50% down   Still dyspneic: 1 flught of steps and better with rest and with albuterol and atrovent prn. Dyspne also worse in winter and cooler weathers. Rates it as moderate. STable since  last visit to slightly worse. thre is some associtaed cough. No asociated weight loss. COugh is moderate and worst early in morning and in cooler temperatues.. Denies sinus drainage. PFts show mixed picture - fvc 3.4L/69%, fev1 2.9/76%, Ratio 85 but 12% bd respose. TLC 5.7/83%. DLCO 23.7/69% rec rec #MEdiastinal node  - suspicion for cancer is low  - you will need CT chest in a year; we will schedule it later  #Smoking  - glad you are working on quitting  #Shortness of breath - this is due to asthma/copd and weight  - please start QVAR 2 puff twice daily - take sample and show technique and take prescription too  - focus on weight loss through the low glycemic diet; take diet sheet from [place]  #FOllowup  - 3 months with spirometry at followup  - flu shot and pneumovax through PMD as discussed   12/17/2013  acute ov/Wert still active smoker re: recurrent pattern of coughing x a decade intermittent / out of control since nov 2014/ can't take symbicort makes it worse / on percocet one tid at baseline  Chief Complaint  Patient presents with  . Acute Visit    MR pt.  C/o mostly nonprod cough with some thick white mucous, coughing until he almost passes out.  Went to [employer] last week.    duoneb daily  Sometime skips the doses later in the day but always uses the am dose, sputum to purulent. Mostly sob with cough/ otherwise breaths ok Last neb was 4 h prior to OV   rec For Cough - use the flutter valve as much as you can plus mucinex dm 1200 mg every 12 hours as needed supplement with percocet up to 2 every 4hours For Breathing  Only use your duoneb  rescue medication . - Ok to use up to    every 4 hours if you must  Prednisone 10 mg take  4 each am x 2 days,   2 each am x 2 days,  1 each am x 2 days and stop  Pantoprazole (protonix) 40 mg   Take 30-60 min before first meal of the day and Pepcid 20 mg one bedtime until return to office - this is the best way to tell whether stomach acid is  contributing to your problem.  GERD  Diet . Please schedule a follow up office visit in 2 weeks, sooner if needed with all meds in hand   12/31/2013 f/u ov/Wert re: last duoneb  2h prior to OV = takes  First thing in am / did not bring all meds Chief Complaint  Patient presents with  . Follow-up    Pt reports cough had improved while on pred, but is worse since stopped med. No new co's today. Out of percocet.      >  Dulera , cough control w/ percocet.   01/14/2014 Follow up and Med Review  Returns for follow up and Med review .  Reports [facility] is improved [facility] We reviewed all his medications. Organized them into a medication calendar with patient education. Appears that he is taking his medications correctly He denies any chest pain, orthopnea, PND or leg swelling.   Current Medications, Allergies, Complete Past Medical History, Past Surgical History, Family History, and Social History were reviewed in [facility] electronic [facility] record.  ROS  The following are not active complaints unless bolded sore throat, dysphagia, dental problems, itching, sneezing,  nasal congestion or excess/ purulent secretions, ear ache,   fever, chills, sweats, unintended wt loss, pleuritic or exertional cp, hemoptysis,  orthopnea pnd or leg swelling, presyncope, palpitations, heartburn, abdominal pain, anorexia, nausea, vomiting, diarrhea  or change in bowel or urinary habits, change in stools or urine, dysuria,hematuria,  rash, arthralgias, visual complaints, headache, numbness weakness or ataxia or problems with walking or coordination,  change in mood/affect or memory.                 Objective:   Physical Exam  amb obese bm nad    12/31/2013       294  > 294 01/14/2014      HEENT: nl dentition, turbinates, and orophanx. Nl external ear canals without cough reflex   NECK :  without JVD/Nodes/TM/ nl carotid upstrokes bilaterally   LUNGS: no acc muscle use, clear  to A and P bilaterally without cough on insp or exp maneuvers   CV:  RRR  no s3 or murmur or increase in P2, no edema   ABD:  soft and nontender with nl excursion in the supine position. No bruits or organomegaly, bowel sounds nl  MS:  warm without deformities, calf tenderness, cyanosis or clubbing  SKIN: warm and dry without lesions    NEURO:  alert, approp, no deficits          12/09/13  cxr No active cardiopulmonary disease.             Assessment & Plan:

## 2014-01-14 NOTE — Patient Instructions (Signed)
Stop Ipratropium Neb .  May use Albuterol Neb every 4hrs as needed for wheezing/shortness of breath.  Follow med calendar closely and As needed   Change benadryl to chlortabs 4mg  As needed  Drainage.  Continue on Dulera 2 puffs Twice daily   rinse after use.  Follow up Dr. Sherene SiresWert  In 6 weeks and As needed   Please contact office for sooner follow up if symptoms do not improve or worsen or seek emergency care

## 2014-01-17 NOTE — Assessment & Plan Note (Signed)
Stop Ipratropium Neb .  May use Albuterol Neb every 4hrs as needed for wheezing/shortness of breath.  Follow med calendar closely and As needed   Change benadryl to chlortabs 4mg As needed  Drainage.  Continue on Dulera 2 puffs Twice daily   rinse after use.  Follow up Dr. Wert  In 6 weeks and As needed   Please contact office for sooner follow up if symptoms do not improve or worsen or seek emergency care   

## 2014-02-03 ENCOUNTER — Other Ambulatory Visit: Payer: Self-pay | Admitting: Family Medicine

## 2014-02-03 NOTE — Telephone Encounter (Signed)
Last seen 01/14/14

## 2014-02-03 NOTE — Telephone Encounter (Signed)
Ok plus 3 ref 

## 2014-02-07 ENCOUNTER — Other Ambulatory Visit: Payer: Self-pay | Admitting: Family Medicine

## 2014-02-11 NOTE — Addendum Note (Signed)
Addended by: Boone MasterJONES, JESSICA E on: 02/11/2014 10:08 AM   Modules accepted: Orders

## 2014-02-16 ENCOUNTER — Ambulatory Visit: Payer: BC Managed Care – PPO | Admitting: Family Medicine

## 2014-02-18 ENCOUNTER — Other Ambulatory Visit: Payer: Self-pay | Admitting: *Deleted

## 2014-02-18 MED ORDER — LANSOPRAZOLE 30 MG PO CPDR: 30.0000 mg | DELAYED_RELEASE_CAPSULE | Freq: Every day | ORAL | Status: DC

## 2014-02-23 ENCOUNTER — Other Ambulatory Visit: Payer: Self-pay | Admitting: *Deleted

## 2014-02-23 MED ORDER — LANSOPRAZOLE 30 MG PO CPDR: 30.0000 mg | DELAYED_RELEASE_CAPSULE | Freq: Every day | ORAL | Status: DC

## 2014-02-25 ENCOUNTER — Ambulatory Visit: Payer: BC Managed Care – PPO | Admitting: Internal Medicine

## 2014-02-25 ENCOUNTER — Other Ambulatory Visit: Payer: Self-pay | Admitting: Family Medicine

## 2014-02-28 ENCOUNTER — Encounter: Payer: Self-pay | Admitting: Internal Medicine

## 2014-02-28 ENCOUNTER — Ambulatory Visit (INDEPENDENT_AMBULATORY_CARE_PROVIDER_SITE_OTHER): Payer: BC Managed Care – PPO | Admitting: Internal Medicine

## 2014-02-28 VITALS — BP 116/84 | HR 84 | Temp 98.5°F | Ht 70.0 in | Wt 292.0 lb

## 2014-02-28 DIAGNOSIS — J45909 Unspecified asthma, uncomplicated: Secondary | ICD-10-CM

## 2014-02-28 DIAGNOSIS — F17200 Nicotine dependence, unspecified, uncomplicated: Secondary | ICD-10-CM

## 2014-02-28 MED ORDER — MOMETASONE FURO-FORMOTEROL FUM 200-5 MCG/ACT IN AERO: INHALATION_SPRAY | RESPIRATORY_TRACT | Status: DC

## 2014-02-28 NOTE — Patient Instructions (Addendum)
Dulera increase to 200 Take 2 puffs first thing in am and then another 2 puffs about 12 hours later.   Work on Musicianperfecting  inhaler technique:  relax and gently blow all the way out then take a nice smooth deep breath back in, triggering the inhaler at same time you start breathing in.  Hold for up to 5 seconds if you can.  Rinse and gargle with water when done  The key is to stop smoking completely before smoking completely stops you!   Please schedule a follow up visit in 3 months but call sooner if needed

## 2014-02-28 NOTE — Progress Notes (Signed)
Subjective:    Patient ID: Joseph [name], male    DOB: 07-31-1971   MRN: [id number]  Brief patient profile:  [age] yowm active smoker without significant airflow obst by PFTs 06/02/12    History of Present Illness  IOV 04/10/12  43 year old male.  reports that he has been smoking Cigarettes.  He has a 40 pack-year smoking history; 2 ppd since age 43. He does not have any smokeless tobacco history on file. Body mass index is 39.97 kg/(m^2) (40# weight gain in 4 years) [name],[name], MD, MD is pcp. At basline has dx of chronic percocet use, OSA on CPAP, Morbid obesity and COPD since early 2002 (has hx of spirometry back then but result NA). Also, hx of pyrethrin inhalation through work in [employer] 1992 - 1999. Currently unemployed. Has had dx of pna in past. Also reports strong hx of breathing issues in family.   In April 2013 reports he had pneumonia apparently. Went to [facility] and found some abnormality (nodule based on review of ER notes) on CXR that resulted in CT Angio 03/07/12 same visit. PE ruled out but found to have mediastinal nodes but the cxr nodule was not there. Apparently ER doctor said it was lymphoma and asked to see an oncologist. But primary care doctor felt that was over zealous and referred patient here. He prefers conservative mgmt to nodes if clinical suspicion for cancer is low  Currently, feels baseline which is lack of energy (spent most of days yesterday in bed), esp in pollen, chronic dyspnea on exertion for 180 feet relieved by rest  But slowly progressive since 2002 for which he uses albuterol prn on average 4 times a day.  Walkt test 185 feet x 3 laps: no desaturation  He smokes at baseline; trying quit, prior intolerance (dreams) with chantix.   Only baseline mild chronic cough with associtaed wheeze that is occasional.. Does have some B symptoms like nocturnal diaphoresis (soaks pillows). Denies weight loss.   CT ANGIOGRAPHY CHEST  Comparison: 03/07/2012  An abnormal  right lower paratracheal node has a short axis diameter  of 1.6 cm. An abnormal AP window lymph node has a short axis  diameter of 1.4 cm. Mild bilateral hilar and infrahilar adenopathy  noted. Scattered small axillary lymph nodes are present.  A subcarinal node has a short axis diameter of 1.5 cm.   No filling defect is identified in the pulmonary arterial tree to  suggest pulmonary embolus.   There is diffuse steatosis of the visualized portion of the liver.   No pericardial effusion noted. No aortic dissection is observed.   Airway thickening noted. In the area of previous concern for right  basilar pulmonary nodule, no nodule is seen. This may have  represented a confluence of vascular shadows or a skin fold.  Old, healed right ninth rib fracture noted. Mild thoracic  spondylosis is present.  I Original Report Authenticated By: Dellia CloudWALTER D. LIEBKEMANN, M.D   #MEdiastinal node  - suspicion for cancer is low  - please do blood test for ACE level to help give us some clue if this could be due to sarcoid or not  - Please have PET scan in 2 months for followup of the mediastinal nodes  #Smoking  - glad you are working on quitting  #COPD history and shortness of breath  - have full pft at followup in 2 months  - nurse will walk you for oxygen levels now  #FOllowup  -  2 months with PET scan and full PFT at followup    OV 08/14/2012   Mediastinal nodes: PET scan 06/10/12 shows very low uptake in the mediastinal nodes. Suspicion for cancer is very low. Lymph nodes include 13 mm right lower paratracheal lymph node and 10 mm prevascular lymph node. ACE level 04/10/12  - is in 20s and normal    Smoking: switched to e-cigs with  Nicotine to help quit. Now 50% e-cigs and 1 pack regular cigs which is 50% down   Still dyspneic: 1 flught of steps and better with rest and with albuterol and atrovent prn. Dyspne also worse in winter and cooler weathers. Rates it as moderate. STable since  last visit to slightly worse. thre is some associtaed cough. No asociated weight loss. COugh is moderate and worst early in morning and in cooler temperatues.. Denies sinus drainage. PFts show mixed picture - fvc 3.4L/69%, fev1 2.9/76%, Ratio 85 but 12% bd respose. TLC 5.7/83%. DLCO 23.7/69% rec rec #MEdiastinal node  - suspicion for cancer is low  - you will need CT chest in a year; we will schedule it later  #Smoking  - glad you are working on quitting  #Shortness of breath - this is due to asthma/copd and weight  - please start QVAR 2 puff twice daily - take sample and show technique and take prescription too  - focus on weight loss through the low glycemic diet; take diet sheet from [place]  #FOllowup  - 3 months with spirometry at followup  - flu shot and pneumovax through PMD as discussed   12/17/2013  acute ov/Kindra Bickham still active smoker re: recurrent pattern of coughing x a decade intermittent / out of control since nov 2014/ can't take symbicort makes it worse / on percocet one tid at baseline  Chief Complaint  Patient presents with  . Acute Visit    MR pt.  C/o mostly nonprod cough with some thick white mucous, coughing until he almost passes out.  Went to [employer] last week.    duoneb daily  Sometime skips the doses later in the day but always uses the am dose, sputum to purulent. Mostly sob with cough/ otherwise breaths ok Last neb was 4 h prior to OV   rec For Cough - use the flutter valve as much as you can plus mucinex dm 1200 mg every 12 hours as needed supplement with percocet up to 2 every 4hours For Breathing  Only use your duoneb  rescue medication . - Ok to use up to    every 4 hours if you must  Prednisone 10 mg take  4 each am x 2 days,   2 each am x 2 days,  1 each am x 2 days and stop  Pantoprazole (protonix) 40 mg   Take 30-60 min before first meal of the day and Pepcid 20 mg one bedtime until return to office - this is the best way to tell whether stomach acid is  contributing to your problem.  GERD  Diet . Please schedule a follow up office visit in 2 weeks, sooner if needed with all meds in hand   12/31/2013 f/u ov/Bethan Adamek re: last duoneb  2h prior to OV = takes  First thing in am / did not bring all meds Chief Complaint  Patient presents with  . Follow-up    Pt reports cough had improved while on pred, but is worse since stopped med. No new co's today. Out of percocet.      >  Dulera 100 2bid  , cough control w/ percocet.   01/14/2014 Follow up and Med Review  Returns for follow up and Med review .  Reports breathing is improved since beginning the [facility]. We reviewed all his medications. Organized them into a medication calendar with patient education. Appears that he is taking his medications correctly rec Stop Ipratropium Neb .  May use Albuterol Neb every 4hrs as needed for wheezing/shortness of breath.  02/28/2014 f/u ov/Deundra Bard re: still smoking / AB but no chronic airflow obst by pfts Chief Complaint  Patient presents with  . Follow-up    Pt states that his breathing is much improved on Dulera. No new co's today.   Not limited by breathing from desired activities  - no need for saba but freq congested cough esp in ams but does not typcially awaken him prematurely  No obvious day to day or daytime variabilty or assoc   cp or chest tightness, subjective wheeze overt sinus or hb symptoms. No unusual exp hx or h/o childhood pna/ asthma or knowledge of premature birth.  Sleeping ok without nocturnal  or early am exacerbation  of respiratory  c/o's or need for noct saba. Also denies any obvious fluctuation of symptoms with weather or environmental changes or other aggravating or alleviating factors except as outlined above   Current Medications, Allergies, Complete Past Medical History, Past Surgical History, Family History, and Social History were reviewed in [employer] record.  ROS  The following are not active complaints  unless bolded sore throat, dysphagia, dental problems, itching, sneezing,  nasal congestion or excess/ purulent secretions, ear ache,   fever, chills, sweats, unintended wt loss, pleuritic or exertional cp, hemoptysis,  orthopnea pnd or leg swelling, presyncope, palpitations, heartburn, abdominal pain, anorexia, nausea, vomiting, diarrhea  or change in bowel or urinary habits, change in stools or urine, dysuria,hematuria,  rash, arthralgias, visual complaints, headache, numbness weakness or ataxia or problems with walking or coordination,  change in mood/affect or memory.             Objective:   Physical Exam  amb obese bm nad    12/31/2013       294  > 294 01/14/2014 > 02/28/2014  292      HEENT: nl dentition, turbinates, and orophanx. Nl external ear canals without cough reflex   NECK :  without JVD/Nodes/TM/ nl carotid upstrokes bilaterally   LUNGS: no acc muscle use, clear to A and P bilaterally with exp rhonchi bilaterally    CV:  RRR  no s3 or murmur or increase in P2, no edema   ABD:  soft and nontender with nl excursion in the supine position. No bruits or organomegaly, bowel sounds nl  MS:  warm without deformities, calf tenderness, cyanosis or clubbing  SKIN: warm and dry without lesions    NEURO:  alert, approp, no deficits          12/09/13  cxr No active cardiopulmonary disease.             Assessment & Plan:

## 2014-03-01 NOTE — Assessment & Plan Note (Signed)
-   PFT's s sign copd 06/2012  DDX of  difficult airways managment all start with A and  include Adherence, Ace Inhibitors, Acid Reflux, Active Sinus Disease, Alpha 1 Antitripsin deficiency, Anxiety masquerading as Airways dz,  ABPA,  allergy(esp in young), Aspiration (esp in elderly), Adverse effects of DPI,  Active smokers, plus two Bs  = Bronchiectasis and Beta blocker use..and one C= CHF  Adherence is always the initial "prime suspect" and is a multilayered concern that requires a "trust but verify" approach in every patient - starting with knowing how to use medications, especially inhalers, correctly, keeping up with refills and understanding the fundamental difference between maintenance and prns vs those medications only taken for a very short course and then stopped and not refilled.  - The proper method of use, as well as anticipated side effects, of a metered-dose inhaler are discussed and demonstrated to the patient. Improved effectiveness after extensive coaching during this visit to a level of approximately  75% from a basline of < 50%  Active smoking > see smoking a/p    Each maintenance medication was reviewed in detail including most importantly the difference between maintenance and as needed and under what circumstances the prns are to be used. This was done in the context of a medication calendar review which provided the patient with a user-friendly unambiguous mechanism for medication administration and reconciliation and provides an action plan for all active problems. It is critical that this be shown to every doctor  for modification during the office visit if necessary so the patient can use it as a working document.

## 2014-03-01 NOTE — Assessment & Plan Note (Signed)

## 2014-03-05 ENCOUNTER — Encounter (HOSPITAL_COMMUNITY): Payer: Self-pay | Admitting: Emergency Medicine

## 2014-03-05 ENCOUNTER — Emergency Department (HOSPITAL_COMMUNITY)
Admission: EM | Admit: 2014-03-05 | Discharge: 2014-03-05 | Disposition: A | Discharge status: Home / Self Care | Payer: BC Managed Care – PPO | Attending: Emergency Medicine | Admitting: Emergency Medicine

## 2014-03-05 DIAGNOSIS — M25569 Pain in unspecified knee: Secondary | ICD-10-CM | POA: Insufficient documentation

## 2014-03-05 DIAGNOSIS — J449 Chronic obstructive pulmonary disease, unspecified: Secondary | ICD-10-CM | POA: Insufficient documentation

## 2014-03-05 DIAGNOSIS — F17200 Nicotine dependence, unspecified, uncomplicated: Secondary | ICD-10-CM | POA: Insufficient documentation

## 2014-03-05 DIAGNOSIS — M79605 Pain in left leg: Secondary | ICD-10-CM

## 2014-03-05 DIAGNOSIS — J4489 Other specified chronic obstructive pulmonary disease: Secondary | ICD-10-CM | POA: Insufficient documentation

## 2014-03-05 DIAGNOSIS — E119 Type 2 diabetes mellitus without complications: Secondary | ICD-10-CM | POA: Insufficient documentation

## 2014-03-05 DIAGNOSIS — G8929 Other chronic pain: Secondary | ICD-10-CM | POA: Insufficient documentation

## 2014-03-05 DIAGNOSIS — Z7982 Long term (current) use of aspirin: Secondary | ICD-10-CM | POA: Insufficient documentation

## 2014-03-05 DIAGNOSIS — Z79899 Other long term (current) drug therapy: Secondary | ICD-10-CM | POA: Insufficient documentation

## 2014-03-05 HISTORY — DX: Pain in left knee: M25.562

## 2014-03-05 HISTORY — DX: Other chronic pain: G89.29

## 2014-03-05 HISTORY — DX: Dorsalgia, unspecified: M54.9

## 2014-03-05 LAB — I-STAT CHEM 8, ED
BUN: 4 mg/dL — ABNORMAL LOW (ref 6–23)
Calcium, Ion: 1.19 mmol/L (ref 1.12–1.23)
Chloride: 100 mEq/L (ref 96–112)
Creatinine, Ser: 0.9 mg/dL (ref 0.50–1.35)
GLUCOSE: 321 mg/dL — AB (ref 70–99)
HEMATOCRIT: 43 % (ref 39.0–52.0)
HEMOGLOBIN: 14.6 g/dL (ref 13.0–17.0)
POTASSIUM: 4.3 meq/L (ref 3.7–5.3)
Sodium: 139 mEq/L (ref 137–147)
TCO2: 27 mmol/L (ref 0–100)

## 2014-03-05 MED ORDER — ENOXAPARIN SODIUM 150 MG/ML ~~LOC~~ SOLN
130.0000 mg | Freq: Once | SUBCUTANEOUS | Status: AC
  Administered 2014-03-05: 130 mg via SUBCUTANEOUS
  Filled 2014-03-05: qty 1

## 2014-03-05 MED ORDER — OXYCODONE-ACETAMINOPHEN 5-325 MG PO TABS
1.0000 | ORAL_TABLET | Freq: Once | ORAL | Status: AC
  Administered 2014-03-05: 1 via ORAL
  Filled 2014-03-05: qty 1

## 2014-03-05 NOTE — ED Notes (Signed)
Patient gave verbal understanding that Ultrasound is scheduled for 0900 tomorrow morning.

## 2014-03-05 NOTE — ED Notes (Signed)
Pt has swelling to left knee.

## 2014-03-05 NOTE — ED Notes (Signed)
Pt c/o left knee pain x2-3 days. Pt had hx of injuries to left knee and states pain has worsened over last 2-3 days. Pt was been unable to put weight on knee.

## 2014-03-05 NOTE — Discharge Instructions (Signed)
PLEASE RETURN TOMORROW MORNING AFTER 9AM FOR YOUR FOLLOWUP ULTRASOUND OF YOUR LEFT LEG TO RULE OUT A BLOOD CLOT  RETURN EARLIER FOR ANY NEW CHEST PAIN, SHORTNESS OF BREATH, WEAKNESS OR NUMBNESS IN YOUR LEFT LEFT OR IF YOU HAVE ANY VOMITING BLOOD OR BLOOD IN YOUR STOOL

## 2014-03-05 NOTE — ED Provider Notes (Signed)
CSN: [id number]     Arrival date & time 03/05/14  1522 History  This chart was scribed for Joya Gaskinsonald W Brisia Schuermann, MD,  by Ashley JacobsBrittany Andrews, ED Scribe. The patient was seen in room APA10/APA10 and the patient's care was started at 3:53 PM.    First MD Initiated Contact with Patient 03/05/14 1550     Chief Complaint  Patient presents with  . Knee Pain      Patient is a 43 y.o. male presenting with knee pain. The history is provided by the patient and medical records. No language interpreter was used.  Knee Pain Associated symptoms: no fever    HPI Comments: Joseph [name] is a 43 y.o. male who presents to the Emergency Department complaining of left knee pain, onset last year and is much worse for the past three days. Pt was working on his vehicle and report that he may have twisted his knee. Denies any prior surgery. The pain radiates to his medial knee and behind his patella. He explains that he is unable to bear weight due to pain. He has increased edema with walking short distances.  Denies fever, vomiting, chest pain and SOB. Denies hx of bleeding ulcers. Denies dark stools and hematemesis. Denies hx of clotting and bleeding disorder. Denies recent travel and extended period of rest.   Past Medical History  Diagnosis Date  . Diabetes mellitus   . COPD (chronic obstructive pulmonary disease)   . Sleep apnea   . Asthma   . Chronic pain of left knee   . Chronic back pain    Past Surgical History  Procedure Laterality Date  . Carpal tunnel release    . Shoulder arthrocentesis    . Hernia repair    . Vasectomy     Family History  Problem Relation Age of Onset  . Emphysema Maternal Grandfather   . Emphysema Maternal Grandmother   . Clotting disorder Maternal Grandmother    History  Substance Use Topics  . Smoking status: Current Every Day Smoker -- 1.50 packs/day for 20 years    Types: Cigarettes    Start date: 12/17/1993  . Smokeless tobacco: Not on file  . Alcohol Use: No     Review of Systems  Constitutional: Negative for fever.  Respiratory: Negative for shortness of breath.   Cardiovascular: Negative for chest pain.  Gastrointestinal: Negative for nausea, vomiting and blood in stool.  Musculoskeletal: Positive for arthralgias, gait problem, joint swelling and myalgias.  Skin: Negative for color change.  Hematological: Does not bruise/bleed easily.  All other systems reviewed and are negative.      Allergies  Azithromycin; Erythromycin; Keflex; Metformin; Symbicort; and Doxycycline  Home Medications   Current Outpatient Rx  Name  Route  Sig  Dispense  Refill  . albuterol (PROAIR HFA) 108 (90 BASE) MCG/ACT inhaler   Inhalation   Inhale 2 puffs into the lungs every 4 (four) hours as needed for wheezing or shortness of breath (((PLAN A))).          Marland Kitchen. albuterol (PROVENTIL) (2.5 MG/3ML) 0.083% nebulizer solution      USE ONE VIAL IN NEBULIZER FOUR TIMES DAILY  (((PLAN B)))         . ALPRAZolam (XANAX) 1 MG tablet      Take 1 tab by mouth at bedtime         . ANDROGEL PUMP 20.25 MG/ACT (1.62%) GEL      APPLY 2 PUMPS DAILY.   75 g  3   . aspirin 325 MG EC tablet   Oral   Take 325 mg by mouth 3 (three) times daily.          . benzonatate (TESSALON) 100 MG capsule      Add 1 every 8 hours as needed if still coughing         . chlorpheniramine (CHLOR-TRIMETON) 4 MG tablet   Oral   Take 4 mg by mouth every 4 (four) hours as needed (drippy nose, drainage or throat clearing).         . citalopram (CELEXA) 40 MG tablet   Oral   Take 40 mg by mouth daily.         Marland Kitchen dextromethorphan (DELSYM) 30 MG/5ML liquid      2 tsp every 12 hours as needed for cough         . glyBURIDE (DIABETA) 5 MG tablet      TAKE ONE TABLET BY MOUTH TWICE DAILY AFTER MEALS   180 tablet   0   . lansoprazole (PREVACID) 30 MG capsule   Oral   Take 1 capsule (30 mg total) by mouth daily at 12 noon.   30 capsule   11   . LORazepam (ATIVAN)  1 MG tablet      TAKE ONE (1) TABLET AT BEDTIME   30 tablet   1   . mometasone-formoterol (DULERA) 200-5 MCG/ACT AERO      Take 2 puffs first thing in am and then another 2 puffs about 12 hours later.   1 Inhaler   11   . Multiple Vitamin (MULTIVITAMIN WITH MINERALS) TABS   Oral   Take 1 tablet by mouth every morning.         Marland Kitchen oxyCODONE-acetaminophen (PERCOCET/ROXICET) 5-325 MG per tablet   Oral   Take 1 tablet by mouth 3 (three) times daily as needed for severe pain.   90 tablet   0   . Respiratory Therapy Supplies (FLUTTER) DEVI      Use as directed   1 each   0    BP 135/75  Pulse 92  Temp(Src) 98.2 F (36.8 C) (Oral)  Resp 20  SpO2 96% Physical Exam CONSTITUTIONAL: Well developed/well nourished HEAD: Normocephalic/atraumatic EYES: EOMI/PERRL ENMT: Mucous membranes moist NECK: supple no meningeal signs SPINE:entire spine nontender CV: S1/S2 noted, no murmurs/rubs/gallops noted LUNGS: Lungs are clear to auscultation bilaterally, no apparent distress ABDOMEN: soft, nontender, no rebound or guarding NEURO: Pt is awake/alert, moves all extremitiesx4 EXTREMITIES: pulses normal, full ROM, tenderness to medial aspect of L knee, L calf tenderness, L thigh tenderness , pitting edema of the L leg, distal pulses intact, no erythema or warmth noted SKIN: warm, color normal PSYCH: no abnormalities of mood noted  ED Course  Procedures  DIAGNOSTIC STUDIES: Oxygen Saturation is 96% on room air, normal by my interpretation.    COORDINATION OF CARE:  3:56 PM Discussed course of care with pt which includes pain medication and US venous imaging . Pt understands and agrees.  No recent traumatic injury, defer plain imaging Given LE edema, calf tenderness and left thigh tenderness, will obtain DVT study tomorrow morning (he is appropriate for outpatient followup and given a dose of lovenox here) He has pain meds at home Advised that if DVT study is negative, plan will be  to remain NWB and f/u with ortho for his knee pain He has no signs of acute infectious etiology at this time  Labs Review Labs Reviewed  I-STAT CHEM 8, ED - Abnormal; Notable for the following:    BUN 4 (*)    Glucose, Bld 321 (*)    All other components within normal limits     MDM   Final diagnoses:  Pain in medial left lower extremity    Nursing notes including past medical history and social history reviewed and considered in documentation Labs/vital reviewed and considered   I personally performed the services described in this documentation, which was scribed in my presence. The recorded information has been reviewed and is accurate.      Joya Gaskins, MD 03/05/14 [phone]

## 2014-03-05 NOTE — ED Notes (Signed)
Patient with no complaints at this time. Respirations even and unlabored. Skin warm/dry. Discharge instructions reviewed with patient at this time. Patient given opportunity to voice concerns/ask questions. Patient discharged at this time and left Emergency Department with steady gait.   

## 2014-03-06 ENCOUNTER — Ambulatory Visit (HOSPITAL_COMMUNITY)
Admit: 2014-03-06 | Discharge: 2014-03-06 | Disposition: A | Discharge status: Home / Self Care | Payer: BC Managed Care – PPO | Source: Ambulatory Visit | Attending: Emergency Medicine | Admitting: Emergency Medicine

## 2014-03-06 DIAGNOSIS — M79609 Pain in unspecified limb: Secondary | ICD-10-CM | POA: Insufficient documentation

## 2014-03-06 NOTE — ED Provider Notes (Signed)
Patient informed of negative doppler. F/U as planned  Juliet Rudeathan R. Rubin PayorPickering, MD 03/06/14 [phone]

## 2014-03-09 ENCOUNTER — Encounter: Payer: Self-pay | Admitting: Family Medicine

## 2014-03-09 ENCOUNTER — Other Ambulatory Visit: Payer: Self-pay | Admitting: Family Medicine

## 2014-03-09 ENCOUNTER — Ambulatory Visit (INDEPENDENT_AMBULATORY_CARE_PROVIDER_SITE_OTHER): Payer: BC Managed Care – PPO | Admitting: Family Medicine

## 2014-03-09 VITALS — BP 120/82 | Ht 70.0 in | Wt 291.0 lb

## 2014-03-09 DIAGNOSIS — J441 Chronic obstructive pulmonary disease with (acute) exacerbation: Secondary | ICD-10-CM

## 2014-03-09 DIAGNOSIS — G894 Chronic pain syndrome: Secondary | ICD-10-CM

## 2014-03-09 MED ORDER — HYDROCODONE-ACETAMINOPHEN 10-325 MG PO TABS: 1.0000 | ORAL_TABLET | Freq: Three times a day (TID) | ORAL | Status: DC | PRN

## 2014-03-09 NOTE — Progress Notes (Signed)
   Subjective:    Patient ID: Joseph [name], male    DOB: 1971-04-19, 43 y.o.   MRN: [id number]  HPI Patient arrives for a follow on chronic pain management.  Patient stated he was seen in ER over the weekend for left leg swelling. Patient stated he had an ultrasound negative for blood clot.  Patient due to see orthopedic surgeon soon.  Continues to have shortness of breath with his COPD. Unfortunately still smoking. His pulmonary doctor change some of his medication. Patient continues to have ongoing significant back pain. States that it is helped by medicine. He would like to try to go back to hydrocodone. Hydrocodone helps more before.  Now on to wear and states it is helping his breathing.  Patient states his sugars overall are in good control. No major low sugar spells. Trying to watch his diet. Exercising some.  Review of Systems No chest pain no headache no shortness of breath other than when wheeziness acting up no change in bowel habits ROS otherwise negative    Objective:   Physical Exam  Alert no apparent distress. HEENT normal. Lungs diminished breath sounds no wheezes no crackles heart rare rhythm. Ankles without edema. Feet C. exam      Assessment & Plan:  Impression 1 chronic pain ongoing challenges. Patient wishes to change back to hydrocodone. #2 type 2 diabetes control good per patient. #3 COPD/asthma clinically stable. #4 recent knee pain due to see orthopedic surgeon. Plan pain medications written out. Diet discussed exercise discussed. Recheck as scheduled. WSL

## 2014-03-09 NOTE — Telephone Encounter (Signed)
Ok plus five ref 

## 2014-03-19 ENCOUNTER — Other Ambulatory Visit: Payer: Self-pay | Admitting: Family Medicine

## 2014-03-21 ENCOUNTER — Other Ambulatory Visit: Payer: Self-pay | Admitting: Orthopedic Surgery

## 2014-03-21 ENCOUNTER — Ambulatory Visit (HOSPITAL_COMMUNITY)
Admission: RE | Admit: 2014-03-21 | Discharge: 2014-03-21 | Disposition: A | Discharge status: Home / Self Care | Payer: BC Managed Care – PPO | Source: Ambulatory Visit | Attending: Orthopedic Surgery | Admitting: Orthopedic Surgery

## 2014-03-21 DIAGNOSIS — M25562 Pain in left knee: Secondary | ICD-10-CM

## 2014-03-21 DIAGNOSIS — M25569 Pain in unspecified knee: Secondary | ICD-10-CM | POA: Insufficient documentation

## 2014-03-21 DIAGNOSIS — W19XXXA Unspecified fall, initial encounter: Secondary | ICD-10-CM | POA: Insufficient documentation

## 2014-03-21 NOTE — Telephone Encounter (Signed)
Last seen 03/09/14

## 2014-03-22 ENCOUNTER — Ambulatory Visit (INDEPENDENT_AMBULATORY_CARE_PROVIDER_SITE_OTHER): Payer: BC Managed Care – PPO | Admitting: Orthopedic Surgery

## 2014-03-22 ENCOUNTER — Encounter: Payer: Self-pay | Admitting: Orthopedic Surgery

## 2014-03-22 VITALS — BP 135/85 | Ht 70.0 in | Wt 288.0 lb

## 2014-03-22 DIAGNOSIS — M23329 Other meniscus derangements, posterior horn of medial meniscus, unspecified knee: Secondary | ICD-10-CM

## 2014-03-22 DIAGNOSIS — S83519A Sprain of anterior cruciate ligament of unspecified knee, initial encounter: Secondary | ICD-10-CM

## 2014-03-22 DIAGNOSIS — S83509A Sprain of unspecified cruciate ligament of unspecified knee, initial encounter: Secondary | ICD-10-CM

## 2014-03-22 NOTE — Progress Notes (Signed)
Patient ID: Joseph ForthJerry R Schwegler, male   DOB: [birth year], 43 y.o.   MRN: [id number]  Chief Complaint  Patient presents with  . Knee Pain    Left knee pain, pervious injury in September 2014    This is a 43 year old male who fell in September 2014 landed on his left knee after twisting it. He went to the emergency room x-rays were done and an ultrasound eventually was done which were normal. Since that time he said locking giving way persistent knee effusion despite treatment with Advil since September of 2014. Also was treated by his family physician with Lorcet  He now complains of sharp throbbing burning medial knee pain with persistent effusion and loss of motion. Pain is 10 out of 10 it is worse with walking and nothing has improved including the use of a cane for the last 6 months  He has a history of shortness of breath secondary to COPD joint pain and swelling is noted other systems reviewed were negative  Past Surgical History  Procedure Laterality Date  . Carpal tunnel release    . Shoulder arthrocentesis    . Hernia repair    . Vasectomy      Past Medical History  Diagnosis Date  . Diabetes mellitus   . COPD (chronic obstructive pulmonary disease)   . Sleep apnea   . Asthma   . Chronic pain of left knee   . Chronic back pain     The past, family history and social history have been reviewed and are recorded in the corresponding sections of epic   Vital signs: BP 135/85  Ht 5\' 10"  (1.778 m)  Wt 288 lb (130.636 kg)  BMI 41.32 kg/m2   General the patient is well-developed and well-nourished grooming and hygiene are normal Oriented x3 Mood and affect normal Ambulation requires a cane and he is limping favoring his left knee Inspection of the left knee Large joint effusion, flexion limited to 90, medial joint line tenderness, motor exam normal positive McMurray sign for medial meniscal tear laxity in the anterior cruciate ligament on the Lachman test  Right leg no  tenderness or swelling Full range of motion All joints are stable Motor exam is normal Skin clean dry and intact  Upper extremity exam  The right and left upper extremity:   Inspection and palpation revealed no abnormalities in the upper extremities.   Range of motion is full without contracture.  Motor exam is normal with grade 5 strength.  The joints are fully reduced without subluxation.  There is no atrophy or tremor and muscle tone is normal.  All joints are stable.  Cardiovascular exam is normal Sensory exam normal   Encounter Diagnoses  Name Primary?  . ACL tear Yes  . Medial meniscus, posterior horn derangement     Knee  Injection and aspiration Procedure Note  Pre-operative Diagnosis: left knee oa, effusion  Post-operative Diagnosis: same  Indications: pain, swelling  Anesthesia: ethyl chloride   Procedure Details   Verbal consent was obtained for the procedure. Time out was completed.The joint was prepped with alcohol, followed by  Ethyl chloride spray and The 18-gauge needle was inserted into the joint via lateral approach and we aspirated approximately 80 cc of clear yellow fluid  This was followed by the injection of 4ml 1% lidocaine and 1 ml of depomedrol  was then injected into the joint . The needle was removed and the area cleansed and dressed.  Complications:  None; patient tolerated the  procedure well.   MRI left knee followup after left knee MRI

## 2014-03-22 NOTE — Patient Instructions (Signed)
You have received a steroid shot. 15% of patients experience increased pain at the injection site with in the next 24 hours. This is best treated with ice and tylenol extra strength 2 tabs every 8 hours. If you are still having pain please call the office.   MRI ordered

## 2014-03-28 ENCOUNTER — Telehealth: Payer: Self-pay | Admitting: Internal Medicine

## 2014-03-28 MED ORDER — MOMETASONE FURO-FORMOTEROL FUM 200-5 MCG/ACT IN AERO: INHALATION_SPRAY | RESPIRATORY_TRACT | Status: DC

## 2014-03-28 NOTE — Telephone Encounter (Signed)
Rx has been sent in. lmtcb x1 

## 2014-03-29 ENCOUNTER — Other Ambulatory Visit: Payer: Self-pay | Admitting: Internal Medicine

## 2014-03-29 NOTE — Telephone Encounter (Signed)
LMOM [facility]

## 2014-04-01 ENCOUNTER — Other Ambulatory Visit: Payer: Self-pay | Admitting: Family Medicine

## 2014-04-01 NOTE — Telephone Encounter (Signed)
Seen 4/8

## 2014-04-01 NOTE — Telephone Encounter (Signed)
Ok plus five mothly ref

## 2014-04-04 ENCOUNTER — Ambulatory Visit (HOSPITAL_COMMUNITY)
Admission: RE | Admit: 2014-04-04 | Discharge: 2014-04-04 | Disposition: A | Discharge status: Home / Self Care | Payer: BC Managed Care – PPO | Source: Ambulatory Visit | Attending: Orthopedic Surgery | Admitting: Orthopedic Surgery

## 2014-04-04 DIAGNOSIS — IMO0002 Reserved for concepts with insufficient information to code with codable children: Secondary | ICD-10-CM | POA: Insufficient documentation

## 2014-04-04 DIAGNOSIS — M2240 Chondromalacia patellae, unspecified knee: Secondary | ICD-10-CM | POA: Insufficient documentation

## 2014-04-04 DIAGNOSIS — M25569 Pain in unspecified knee: Secondary | ICD-10-CM | POA: Insufficient documentation

## 2014-04-04 DIAGNOSIS — W19XXXA Unspecified fall, initial encounter: Secondary | ICD-10-CM | POA: Insufficient documentation

## 2014-04-04 DIAGNOSIS — S83519A Sprain of anterior cruciate ligament of unspecified knee, initial encounter: Secondary | ICD-10-CM

## 2014-04-04 DIAGNOSIS — M23329 Other meniscus derangements, posterior horn of medial meniscus, unspecified knee: Secondary | ICD-10-CM

## 2014-04-07 ENCOUNTER — Ambulatory Visit (INDEPENDENT_AMBULATORY_CARE_PROVIDER_SITE_OTHER): Payer: BC Managed Care – PPO | Admitting: Orthopedic Surgery

## 2014-04-07 ENCOUNTER — Encounter: Payer: Self-pay | Admitting: Orthopedic Surgery

## 2014-04-07 VITALS — BP 141/96 | Ht 70.0 in | Wt 288.0 lb

## 2014-04-07 DIAGNOSIS — S83207A Unspecified tear of unspecified meniscus, current injury, left knee, initial encounter: Secondary | ICD-10-CM

## 2014-04-07 DIAGNOSIS — IMO0002 Reserved for concepts with insufficient information to code with codable children: Secondary | ICD-10-CM

## 2014-04-07 MED ORDER — OXYCODONE-ACETAMINOPHEN 5-325 MG PO TABS: 1.0000 | ORAL_TABLET | ORAL | Status: DC | PRN

## 2014-04-07 NOTE — Progress Notes (Signed)
Patient ID: Ginny ForthJerry R Valido, male   DOB: [birth year], 43 y.o.   MRN: [id number]  Chief Complaint  Patient presents with  . Results    MRI Results left knee    43 year old male fell in September and landed on his left knee after twisting injury presented with pain. MRI showed torn medial meniscus mild arthritis patellofemoral joint and medial compartment complains of pain persistent despite being on Lorcet for chronic pain. He is on a contract with Dr. Lubertha SouthSteve Luking  Current review of systems negative  I reviewed his MRI and the report  scheduling for surgery    I discussed surgical options versus continued nonoperative treatment with him and he will require arthroscopic surgery and partial medial meniscectomy  I will contact his doctor via epidural that him know that he will get a new prescription today for Percocet 5 mg until surgery we will continue that for 6 weeks postop

## 2014-04-07 NOTE — Patient Instructions (Addendum)
SALK with medial menisectomy possibly 5/20 or 5/22 for surgery, the nurse will call you with dates and timesArthroscopic Procedure, Knee An arthroscopic procedure can find what is wrong with your knee. PROCEDURE Arthroscopy is a surgical technique that allows your orthopedic surgeon to diagnose and treat your knee injury with accuracy. They will look into your knee through a small instrument. This is almost like a small (pencil sized) telescope. Because arthroscopy affects your knee less than open knee surgery, you can anticipate a more rapid recovery. Taking an active role by following your caregiver's instructions will help with rapid and complete recovery. Use crutches, rest, elevation, ice, and knee exercises as instructed. The length of recovery depends on various factors including type of injury, age, physical condition, medical conditions, and your rehabilitation. Your knee is the joint between the large bones (femur and tibia) in your leg. Cartilage covers these bone ends which are smooth and slippery and allow your knee to bend and move smoothly. Two menisci, thick, semi-lunar shaped pads of cartilage which form a rim inside the joint, help absorb shock and stabilize your knee. Ligaments bind the bones together and support your knee joint. Muscles move the joint, help support your knee, and take stress off the joint itself. Because of this all programs and physical therapy to rehabilitate an injured or repaired knee require rebuilding and strengthening your muscles. AFTER THE PROCEDURE  After the procedure, you will be moved to a recovery area until most of the effects of the medication have worn off. Your caregiver will discuss the test results with you.  Only take over-the-counter or prescription medicines for pain, discomfort, or fever as directed by your caregiver. SEEK MEDICAL CARE IF:   You have increased bleeding from your wounds.  You see redness, swelling, or have increasing pain in  your wounds.  You have pus coming from your wound.  You have an oral temperature above 102 F (38.9 C).  You notice a bad smell coming from the wound or dressing.  You have severe pain with any motion of your knee. SEEK IMMEDIATE MEDICAL CARE IF:   You develop a rash.  You have difficulty breathing.  You have any allergic problems. Document Released: 11/15/2000 Document Revised: 02/10/2012 Document Reviewed: 06/08/2008 [facility] Patient Information 2014 [employer].  Meds ordered this encounter  Medications  . oxyCODONE-acetaminophen (PERCOCET/ROXICET) 5-325 MG per tablet    Sig: Take 1 tablet by mouth every 4 (four) hours as needed for severe pain.    Dispense:  56 tablet    Refill:  0

## 2014-04-08 ENCOUNTER — Telehealth: Payer: Self-pay | Admitting: Family Medicine

## 2014-04-08 NOTE — Telephone Encounter (Signed)
Pt states that he was seen by Dr Romeo AppleHarrison an was put on  Percocet. He said he wanted you to be aware so that you  Didn't think he was going any where else to get meds.   If you have any questions please call the pt

## 2014-04-10 NOTE — Telephone Encounter (Signed)
How many? How often? hold hydrocod, cancel all refills, notify us when starting back with us managing pain (recurring theme for this pt getting on nrcotics elsewhere for various reasons)

## 2014-04-11 ENCOUNTER — Telehealth: Payer: Self-pay | Admitting: Orthopedic Surgery

## 2014-04-11 NOTE — Telephone Encounter (Signed)
[facility], pharmacist tech Tammy, stating that patient already receives pain medication, Hydrocodone from Dr. Kristine RoyalW.S. Luking; therefore, the pharmacy did not fill the prescription from Dr Romeo AppleHarrison issued 04/07/14.  Patient relayed to them that Dr Romeo AppleHarrison aware that he receives it from Dr. Gerda DissLuking.  Please advise.  [facility] phone # [phone].

## 2014-04-11 NOTE — Telephone Encounter (Addendum)
Patient filled Hydrocodone 10/325 mg #90 on 5/8/15[facility]  [facility]acy has not filled the Percocet rx form Dr.Harrison yet. Pharmacist to contact [name]'s office to inform them of situation.

## 2014-04-12 ENCOUNTER — Telehealth: Payer: Self-pay | Admitting: Orthopedic Surgery

## 2014-04-12 NOTE — Telephone Encounter (Signed)
Call received from patient; states ready to schedule knee surgery for around 5/27 - 04/29/14, if possible; please advise of date for pre-op and for surgery. (Recent separate note was entered regarding a question from pharmacy, 04/11/14).  Patient's cell ph# [phone].

## 2014-04-12 NOTE — Telephone Encounter (Signed)
Routing to Dr Harrison 

## 2014-04-13 ENCOUNTER — Other Ambulatory Visit: Payer: Self-pay | Admitting: *Deleted

## 2014-04-13 ENCOUNTER — Encounter (HOSPITAL_COMMUNITY): Payer: Self-pay

## 2014-04-13 NOTE — Telephone Encounter (Signed)
Scheduled for 04/22/14

## 2014-04-18 ENCOUNTER — Encounter (HOSPITAL_COMMUNITY): Payer: Self-pay

## 2014-04-18 ENCOUNTER — Encounter (HOSPITAL_COMMUNITY)
Admission: RE | Admit: 2014-04-18 | Discharge: 2014-04-18 | Disposition: A | Discharge status: Home / Self Care | Payer: BC Managed Care – PPO | Source: Ambulatory Visit | Attending: Orthopedic Surgery | Admitting: Orthopedic Surgery

## 2014-04-18 DIAGNOSIS — Z01812 Encounter for preprocedural laboratory examination: Secondary | ICD-10-CM | POA: Insufficient documentation

## 2014-04-18 HISTORY — DX: Shortness of breath: R06.02

## 2014-04-18 HISTORY — DX: Gastro-esophageal reflux disease without esophagitis: K21.9

## 2014-04-18 HISTORY — DX: Unspecified osteoarthritis, unspecified site: M19.90

## 2014-04-18 LAB — BASIC METABOLIC PANEL
BUN: 10 mg/dL (ref 6–23)
CHLORIDE: 98 meq/L (ref 96–112)
CO2: 25 meq/L (ref 19–32)
CREATININE: 0.75 mg/dL (ref 0.50–1.35)
Calcium: 9.3 mg/dL (ref 8.4–10.5)
GFR calc Af Amer: >90 mL/min (ref >90)
GFR calc non Af Amer: >90 mL/min (ref >90)
Glucose, Bld: 257 mg/dL — ABNORMAL HIGH (ref 70–99)
Potassium: 4.3 mEq/L (ref 3.7–5.3)
Sodium: 135 mEq/L — ABNORMAL LOW (ref 137–147)

## 2014-04-18 LAB — HEMOGLOBIN AND HEMATOCRIT, BLOOD
HCT: 43.2 % (ref 39.0–52.0)
HEMOGLOBIN: 15.1 g/dL (ref 13.0–17.0)

## 2014-04-18 NOTE — Patient Instructions (Signed)
Ginny ForthJerry R Ollis  04/18/2014   Your procedure is scheduled on:   04/22/2014  [facility]  [facility]  AM.  Call this number if you have problems the morning of surgery: [phone]   Remember:   Do not eat food or drink liquids after midnight.   Take these medicines the morning of surgery with A SIP OF WATER:  Citalopram, xanax, hydrocodone or percocet. Take albuterol, atrovent and dulera before you come and bring them with you.   Do not wear jewelry, make-up or nail polish.  Do not wear lotions, powders, or perfumes.   Do not shave 48 hours prior to surgery. Men may shave face and neck.  Do not bring valuables to the hospital.  [facility] belongings or valuables.               Contacts, dentures or bridgework may not be worn into surgery.  Leave suitcase in the car. After surgery it may be brought to your room.  For patients admitted to the hospital, discharge time is determined by your treatment team.               Patients discharged the day of surgery will not be allowed to drive home.  Name and phone number of your driver: family  Special Instructions: Shower using CHG 2 nights before surgery and the night before surgery.  If you shower the day of surgery use CHG.  Use special wash - you have one bottle of CHG for all showers.  You should use approximately 1/3 of the bottle for each shower.   Please read over the following fact sheets that you were given: Pain Booklet, Coughing and Deep Breathing, Surgical Site Infection Prevention, Anesthesia Post-op Instructions and Care and Recovery After Surgery Arthroscopic Procedure, Knee An arthroscopic procedure can find what is wrong with your knee. PROCEDURE Arthroscopy is a surgical technique that allows your orthopedic surgeon to diagnose and treat your knee injury with accuracy. They will look into your knee through a small instrument. This is almost like a small (pencil sized) telescope. Because  arthroscopy affects your knee less than open knee surgery, you can anticipate a more rapid recovery. Taking an active role by following your caregiver's instructions will help with rapid and complete recovery. Use crutches, rest, elevation, ice, and knee exercises as instructed. The length of recovery depends on various factors including type of injury, age, physical condition, medical conditions, and your rehabilitation. Your knee is the joint between the large bones (femur and tibia) in your leg. Cartilage covers these bone ends which are smooth and slippery and allow your knee to bend and move smoothly. Two menisci, thick, semi-lunar shaped pads of cartilage which form a rim inside the joint, help absorb shock and stabilize your knee. Ligaments bind the bones together and support your knee joint. Muscles move the joint, help support your knee, and take stress off the joint itself. Because of this all programs and physical therapy to rehabilitate an injured or repaired knee require rebuilding and strengthening your muscles. AFTER THE PROCEDURE  After the procedure, you will be moved to a recovery area until most of the effects of the medication have worn off. Your caregiver will discuss the test results with you.  Only take over-the-counter or prescription medicines for pain, discomfort, or fever as directed by your caregiver. SEEK MEDICAL CARE IF:   You have increased bleeding from your wounds.  You see redness, swelling, or have increasing pain in your wounds.  You have pus coming from your wound.  You have an oral temperature above 102 F (38.9 C).  You notice a bad smell coming from the wound or dressing.  You have severe pain with any motion of your knee. SEEK IMMEDIATE MEDICAL CARE IF:   You develop a rash.  You have difficulty breathing.  You have any allergic problems. Document Released: 11/15/2000 Document Revised: 02/10/2012 Document Reviewed: 06/08/2008 [facility] Patient  Information 2014 [employer] [place]. PATIENT INSTRUCTIONS POST-ANESTHESIA  IMMEDIATELY FOLLOWING SURGERY:  Do not drive or operate machinery for the first twenty four hours after surgery.  Do not make any important decisions for twenty four hours after surgery or while taking narcotic pain medications or sedatives.  If you develop intractable nausea and vomiting or a severe headache please notify your doctor immediately.  FOLLOW-UP:  Please make an appointment with your surgeon as instructed. You do not need to follow up with anesthesia unless specifically instructed to do so.  WOUND CARE INSTRUCTIONS (if applicable):  Keep a dry clean dressing on the anesthesia/puncture wound site if there is drainage.  Once the wound has quit draining you may leave it open to air.  Generally you should leave the bandage intact for twenty four hours unless there is drainage.  If the epidural site drains for more than 36-48 hours please call the anesthesia department.  QUESTIONS?:  Please feel free to call your physician or the hospital operator if you have any questions, and they will be happy to assist you.

## 2014-04-19 ENCOUNTER — Other Ambulatory Visit: Payer: Self-pay | Admitting: Family Medicine

## 2014-04-20 NOTE — H&P (Signed)
  Chief Complaint   Patient presents with   .  Knee Pain       Left knee pain, pervious injury in September 2014     This is a 43 year old male who fell in September 2014 landed on his left knee after twisting it. He went to the emergency room x-rays were done and an ultrasound eventually was done which were normal. Since that time he said locking giving way persistent knee effusion despite treatment with Advil since September of 2014. Also was treated by his family physician with Lorcet  He now complains of sharp throbbing burning medial knee pain with persistent effusion and loss of motion. Pain is 10 out of 10 it is worse with walking and nothing has improved including the use of a cane for the last 6 months  He has a history of shortness of breath secondary to COPD joint pain and swelling is noted other systems reviewed were negative    Past Surgical History   Procedure  Laterality  Date   .  Carpal tunnel release       .  Shoulder arthrocentesis       .  Hernia repair       .  Vasectomy           Past Medical History   Diagnosis  Date   .  Diabetes mellitus     .  COPD (chronic obstructive pulmonary disease)     .  Sleep apnea     .  Asthma     .  Chronic pain of left knee     .  Chronic back pain       The past, family history and social history have been reviewed and are recorded in the corresponding sections of epic   Vital signs: BP 135/85  Ht 5\' 10"  (1.778 m)  Wt 288 lb (130.636 kg)  BMI 41.32 kg/m2   General the patient is well-developed and well-nourished grooming and hygiene are normal Oriented x3 Mood and affect normal Ambulation requires a cane and he is limping favoring his left knee Inspection of the left knee Large joint effusion, flexion limited to 90, medial joint line tenderness, motor exam normal positive McMurray sign for medial meniscal tear laxity in the anterior cruciate ligament on the Lachman test  Right leg no tenderness or swelling Full  range of motion All joints are stable Motor exam is normal Skin clean dry and intact  Upper extremity exam  The right and left upper extremity:     Inspection and palpation revealed no abnormalities in the upper extremities.    Range of motion is full without contracture.  Motor exam is normal with grade 5 strength.  The joints are fully reduced without subluxation.  There is no atrophy or tremor and muscle tone is normal.  All joints are stable.  Cardiovascular exam is normal Sensory exam normal    MRI findings IMPRESSION: Large tear posterior horn and posterior body of the medial meniscus. A small meniscal fragment is displaced along the medial tibial plateau at the medial joint line.   Focal chondromalacia patella at the apex the midpole with mild underlying reactive marrow signal change.  Diagnosis tear the posterior horn of the medial meniscus and chondromalacia of the patella  Plan arthroscopic surgery left knee with partial medial meniscectomy

## 2014-04-21 ENCOUNTER — Telehealth: Payer: Self-pay | Admitting: Orthopedic Surgery

## 2014-04-21 NOTE — Telephone Encounter (Signed)
Regarding out-patient surgery scheduled at [facility] 04/22/14, CPT [phone], [id number] -- contacted [employer], ph# [phone] -- per Marena ChancyBianca L, no pre-authorization required.  Her name and today's date, 04/20/14, 12:57p.m.

## 2014-04-22 ENCOUNTER — Encounter (HOSPITAL_COMMUNITY): Payer: BC Managed Care – PPO | Admitting: Anesthesiology

## 2014-04-22 ENCOUNTER — Encounter (HOSPITAL_COMMUNITY)
Admission: RE | Disposition: A | Discharge status: Home / Self Care | Payer: Self-pay | Source: Ambulatory Visit | Attending: Orthopedic Surgery

## 2014-04-22 ENCOUNTER — Ambulatory Visit (HOSPITAL_COMMUNITY): Payer: BC Managed Care – PPO | Admitting: Anesthesiology

## 2014-04-22 ENCOUNTER — Encounter (HOSPITAL_COMMUNITY): Payer: Self-pay | Admitting: *Deleted

## 2014-04-22 ENCOUNTER — Ambulatory Visit (HOSPITAL_COMMUNITY)
Admission: RE | Admit: 2014-04-22 | Discharge: 2014-04-22 | Disposition: A | Discharge status: Home / Self Care | Payer: BC Managed Care – PPO | Source: Ambulatory Visit | Attending: Orthopedic Surgery | Admitting: Orthopedic Surgery

## 2014-04-22 DIAGNOSIS — S83207A Unspecified tear of unspecified meniscus, current injury, left knee, initial encounter: Secondary | ICD-10-CM

## 2014-04-22 DIAGNOSIS — G4730 Sleep apnea, unspecified: Secondary | ICD-10-CM | POA: Insufficient documentation

## 2014-04-22 DIAGNOSIS — M23329 Other meniscus derangements, posterior horn of medial meniscus, unspecified knee: Secondary | ICD-10-CM

## 2014-04-22 DIAGNOSIS — X58XXXA Exposure to other specified factors, initial encounter: Secondary | ICD-10-CM | POA: Insufficient documentation

## 2014-04-22 DIAGNOSIS — E119 Type 2 diabetes mellitus without complications: Secondary | ICD-10-CM | POA: Insufficient documentation

## 2014-04-22 DIAGNOSIS — IMO0002 Reserved for concepts with insufficient information to code with codable children: Secondary | ICD-10-CM | POA: Insufficient documentation

## 2014-04-22 DIAGNOSIS — J449 Chronic obstructive pulmonary disease, unspecified: Secondary | ICD-10-CM | POA: Insufficient documentation

## 2014-04-22 DIAGNOSIS — Y929 Unspecified place or not applicable: Secondary | ICD-10-CM | POA: Insufficient documentation

## 2014-04-22 DIAGNOSIS — M1710 Unilateral primary osteoarthritis, unspecified knee: Secondary | ICD-10-CM | POA: Insufficient documentation

## 2014-04-22 DIAGNOSIS — J4489 Other specified chronic obstructive pulmonary disease: Secondary | ICD-10-CM | POA: Insufficient documentation

## 2014-04-22 DIAGNOSIS — M2240 Chondromalacia patellae, unspecified knee: Secondary | ICD-10-CM | POA: Insufficient documentation

## 2014-04-22 HISTORY — PX: KNEE ARTHROSCOPY WITH MEDIAL MENISECTOMY: SHX5651

## 2014-04-22 HISTORY — DX: Other meniscus derangements, posterior horn of medial meniscus, unspecified knee: M23.329

## 2014-04-22 LAB — GLUCOSE, CAPILLARY
Glucose-Capillary: 237 mg/dL — ABNORMAL HIGH (ref 70–99)
Glucose-Capillary: 195 mg/dL — ABNORMAL HIGH (ref 70–99)

## 2014-04-22 SURGERY — ARTHROSCOPY, KNEE, WITH MEDIAL MENISCECTOMY
Anesthesia: General | Site: Knee | Laterality: Left

## 2014-04-22 MED ORDER — LIDOCAINE HCL 1 % IJ SOLN
INTRAMUSCULAR | Status: DC | PRN
  Administered 2014-04-22: 50 mg via INTRADERMAL

## 2014-04-22 MED ORDER — MIDAZOLAM HCL 2 MG/2ML IJ SOLN
1.0000 mg | INTRAMUSCULAR | Status: DC | PRN
  Administered 2014-04-22: 2 mg via INTRAVENOUS

## 2014-04-22 MED ORDER — EPINEPHRINE HCL 1 MG/ML IJ SOLN
INTRAMUSCULAR | Status: AC
  Filled 2014-04-22: qty 5

## 2014-04-22 MED ORDER — SODIUM CHLORIDE 0.9 % IV SOLN
1500.0000 mg | INTRAVENOUS | Status: AC
  Administered 2014-04-22: 1500 mg via INTRAVENOUS

## 2014-04-22 MED ORDER — BUPIVACAINE-EPINEPHRINE (PF) 0.5% -1:200000 IJ SOLN
INTRAMUSCULAR | Status: AC
  Filled 2014-04-22: qty 60

## 2014-04-22 MED ORDER — CHLORHEXIDINE GLUCONATE 4 % EX LIQD: 60.0000 mL | Freq: Once | CUTANEOUS | Status: DC

## 2014-04-22 MED ORDER — ONDANSETRON HCL 4 MG/2ML IJ SOLN
INTRAMUSCULAR | Status: AC
  Filled 2014-04-22: qty 2

## 2014-04-22 MED ORDER — MIDAZOLAM HCL 2 MG/2ML IJ SOLN
INTRAMUSCULAR | Status: AC
  Filled 2014-04-22: qty 2

## 2014-04-22 MED ORDER — KETOROLAC TROMETHAMINE 30 MG/ML IJ SOLN
30.0000 mg | Freq: Once | INTRAMUSCULAR | Status: AC
  Administered 2014-04-22: 30 mg via INTRAVENOUS
  Filled 2014-04-22: qty 1

## 2014-04-22 MED ORDER — FENTANYL CITRATE 0.05 MG/ML IJ SOLN: 25.0000 ug | INTRAMUSCULAR | Status: DC | PRN

## 2014-04-22 MED ORDER — GLYCOPYRROLATE 0.2 MG/ML IJ SOLN
INTRAMUSCULAR | Status: AC
  Filled 2014-04-22: qty 1

## 2014-04-22 MED ORDER — BUPIVACAINE-EPINEPHRINE (PF) 0.5% -1:200000 IJ SOLN
INTRAMUSCULAR | Status: DC | PRN
  Administered 2014-04-22: 60 mL

## 2014-04-22 MED ORDER — SUCCINYLCHOLINE CHLORIDE 20 MG/ML IJ SOLN
INTRAMUSCULAR | Status: AC
  Filled 2014-04-22: qty 1

## 2014-04-22 MED ORDER — FENTANYL CITRATE 0.05 MG/ML IJ SOLN
INTRAMUSCULAR | Status: AC
  Filled 2014-04-22: qty 5

## 2014-04-22 MED ORDER — PROPOFOL 10 MG/ML IV BOLUS
INTRAVENOUS | Status: DC | PRN
  Administered 2014-04-22: 200 mg via INTRAVENOUS

## 2014-04-22 MED ORDER — SODIUM CHLORIDE 0.9 % IR SOLN
Status: DC | PRN
  Administered 2014-04-22: 1000 mL

## 2014-04-22 MED ORDER — PROPOFOL 10 MG/ML IV BOLUS
INTRAVENOUS | Status: AC
  Filled 2014-04-22: qty 20

## 2014-04-22 MED ORDER — HYDROCODONE-ACETAMINOPHEN 5-325 MG PO TABS
1.0000 | ORAL_TABLET | Freq: Once | ORAL | Status: AC
  Administered 2014-04-22: 1 via ORAL
  Filled 2014-04-22: qty 1

## 2014-04-22 MED ORDER — LIDOCAINE HCL (PF) 1 % IJ SOLN
INTRAMUSCULAR | Status: AC
  Filled 2014-04-22: qty 5

## 2014-04-22 MED ORDER — LACTATED RINGERS IV SOLN
INTRAVENOUS | Status: DC
  Administered 2014-04-22: 11:00:00 via INTRAVENOUS

## 2014-04-22 MED ORDER — ONDANSETRON HCL 4 MG/2ML IJ SOLN: 4.0000 mg | Freq: Once | INTRAMUSCULAR | Status: DC | PRN

## 2014-04-22 MED ORDER — OXYCODONE-ACETAMINOPHEN 5-325 MG PO TABS: 1.0000 | ORAL_TABLET | ORAL | Status: DC | PRN

## 2014-04-22 MED ORDER — FENTANYL CITRATE 0.05 MG/ML IJ SOLN
INTRAMUSCULAR | Status: DC | PRN
  Administered 2014-04-22: 50 ug via INTRAVENOUS
  Administered 2014-04-22 (×3): 25 ug via INTRAVENOUS

## 2014-04-22 MED ORDER — FENTANYL CITRATE 0.05 MG/ML IJ SOLN
25.0000 ug | INTRAMUSCULAR | Status: AC
  Administered 2014-04-22 (×2): 25 ug via INTRAVENOUS

## 2014-04-22 MED ORDER — FENTANYL CITRATE 0.05 MG/ML IJ SOLN
INTRAMUSCULAR | Status: AC
  Filled 2014-04-22: qty 2

## 2014-04-22 MED ORDER — GLYCOPYRROLATE 0.2 MG/ML IJ SOLN
0.2000 mg | Freq: Once | INTRAMUSCULAR | Status: AC
  Administered 2014-04-22: 0.2 mg via INTRAVENOUS

## 2014-04-22 MED ORDER — GLYCOPYRROLATE 0.2 MG/ML IJ SOLN
INTRAMUSCULAR | Status: DC | PRN
  Administered 2014-04-22: .2 mg via INTRAVENOUS

## 2014-04-22 MED ORDER — ONDANSETRON HCL 4 MG/2ML IJ SOLN
4.0000 mg | Freq: Once | INTRAMUSCULAR | Status: AC
  Administered 2014-04-22: 4 mg via INTRAVENOUS
  Filled 2014-04-22: qty 2

## 2014-04-22 MED ORDER — PROMETHAZINE HCL 12.5 MG PO TABS: 12.5000 mg | ORAL_TABLET | Freq: Four times a day (QID) | ORAL | Status: DC | PRN

## 2014-04-22 MED ORDER — ONDANSETRON HCL 4 MG/2ML IJ SOLN
4.0000 mg | Freq: Once | INTRAMUSCULAR | Status: AC
  Administered 2014-04-22: 4 mg via INTRAVENOUS

## 2014-04-22 MED ORDER — SODIUM CHLORIDE 0.9 % IR SOLN
Status: DC | PRN
  Administered 2014-04-22 (×3)

## 2014-04-22 SURGICAL SUPPLY — 55 items
ARTHROWAND PARAGON T2 (SURGICAL WAND)
BAG HAMPER (MISCELLANEOUS) ×3 IMPLANT
BANDAGE ELASTIC 6 VELCRO NS (GAUZE/BANDAGES/DRESSINGS) ×3 IMPLANT
BLADE 11 SAFETY STRL DISP (BLADE) ×1 IMPLANT
BLADE AGGRESSIVE PLUS 4.0 (BLADE) ×3 IMPLANT
BLADE SURG SZ11 CARB STEEL (BLADE) ×2 IMPLANT
CHLORAPREP W/TINT 26ML (MISCELLANEOUS) ×4 IMPLANT
CLOTH BEACON ORANGE TIMEOUT ST (SAFETY) ×3 IMPLANT
COOLER CRYO IC GRAV AND TUBE (ORTHOPEDIC SUPPLIES) ×3 IMPLANT
CUFF CRYO KNEE LG 20X31 COOLER (ORTHOPEDIC SUPPLIES) ×2 IMPLANT
CUFF CRYO KNEE18X23 MED (MISCELLANEOUS) IMPLANT
CUFF TOURNIQUET SINGLE 34IN LL (TOURNIQUET CUFF) ×2 IMPLANT
CUFF TOURNIQUET SINGLE 44IN (TOURNIQUET CUFF) IMPLANT
CUTTER ANGLED DBL BITE 4.5 (BURR) IMPLANT
DECANTER SPIKE VIAL GLASS SM (MISCELLANEOUS) ×6 IMPLANT
GAUZE SPONGE 4X4 12PLY STRL (GAUZE/BANDAGES/DRESSINGS) ×2 IMPLANT
GAUZE SPONGE 4X4 16PLY XRAY LF (GAUZE/BANDAGES/DRESSINGS) ×3 IMPLANT
GAUZE XEROFORM 5X9 LF (GAUZE/BANDAGES/DRESSINGS) ×3 IMPLANT
GLOVE BIOGEL M 7.0 STRL (GLOVE) ×2 IMPLANT
GLOVE BIOGEL PI IND STRL 7.0 (GLOVE) IMPLANT
GLOVE BIOGEL PI INDICATOR 7.0 (GLOVE) ×4
GLOVE SKINSENSE NS SZ8.0 LF (GLOVE) ×2
GLOVE SKINSENSE STRL SZ8.0 LF (GLOVE) ×1 IMPLANT
GLOVE SS N UNI LF 8.5 STRL (GLOVE) ×3 IMPLANT
GOWN STRL REUS W/TWL LRG LVL3 (GOWN DISPOSABLE) ×9 IMPLANT
GOWN STRL REUS W/TWL XL LVL3 (GOWN DISPOSABLE) ×3 IMPLANT
HLDR LEG FOAM (MISCELLANEOUS) ×1 IMPLANT
IV NS IRRIG 3000ML ARTHROMATIC (IV SOLUTION) ×8 IMPLANT
KIT BLADEGUARD II DBL (SET/KITS/TRAYS/PACK) ×3 IMPLANT
KIT ROOM TURNOVER AP CYSTO (KITS) ×3 IMPLANT
LEG HOLDER FOAM (MISCELLANEOUS) ×2
MANIFOLD NEPTUNE II (INSTRUMENTS) ×3 IMPLANT
MARKER SKIN DUAL TIP RULER LAB (MISCELLANEOUS) ×3 IMPLANT
NDL HYPO 18GX1.5 BLUNT FILL (NEEDLE) ×1 IMPLANT
NDL HYPO 21X1.5 SAFETY (NEEDLE) ×1 IMPLANT
NDL SPNL 18GX3.5 QUINCKE PK (NEEDLE) ×1 IMPLANT
NEEDLE HYPO 18GX1.5 BLUNT FILL (NEEDLE) ×3 IMPLANT
NEEDLE HYPO 21X1.5 SAFETY (NEEDLE) ×3 IMPLANT
NEEDLE SPNL 18GX3.5 QUINCKE PK (NEEDLE) ×3 IMPLANT
NS IRRIG 1000ML POUR BTL (IV SOLUTION) ×3 IMPLANT
PACK ARTHRO LIMB DRAPE STRL (MISCELLANEOUS) ×3 IMPLANT
PAD ABD 5X9 TENDERSORB (GAUZE/BANDAGES/DRESSINGS) ×3 IMPLANT
PAD ARMBOARD 7.5X6 YLW CONV (MISCELLANEOUS) ×3 IMPLANT
PADDING CAST COTTON 6X4 STRL (CAST SUPPLIES) ×3 IMPLANT
SET ARTHROSCOPY INST (INSTRUMENTS) ×3 IMPLANT
SET ARTHROSCOPY PUMP TUBE (IRRIGATION / IRRIGATOR) ×3 IMPLANT
SET BASIN LINEN APH (SET/KITS/TRAYS/PACK) ×3 IMPLANT
SPONGE GAUZE 4X4 12PLY (GAUZE/BANDAGES/DRESSINGS) ×2 IMPLANT
SUT ETHILON 3 0 FSL (SUTURE) ×2 IMPLANT
SYR 30ML LL (SYRINGE) ×3 IMPLANT
SYRINGE 10CC LL (SYRINGE) ×3 IMPLANT
WAND 50 DEG COVAC W/CORD (SURGICAL WAND) ×2 IMPLANT
WAND 90 DEG TURBOVAC W/CORD (SURGICAL WAND) IMPLANT
WAND ARTHRO PARAGON T2 (SURGICAL WAND) IMPLANT
YANKAUER SUCT BULB TIP 10FT TU (MISCELLANEOUS) ×9 IMPLANT

## 2014-04-22 NOTE — Anesthesia Preprocedure Evaluation (Signed)
Anesthesia Evaluation  Patient identified by MRN, date of birth, ID band Patient awake    Reviewed: Allergy & Precautions, H&P , NPO status , Patient's Chart, lab work & pertinent test results  History of Anesthesia Complications (+) history of anesthetic complications (severe coughing after emergence due to smoking & COPD)  Airway Mallampati: II TM Distance: >3 FB   Mouth opening: Limited Mouth Opening  Dental  (+) Teeth Intact, Partial Upper   Pulmonary shortness of breath and with exertion, asthma , sleep apnea and Continuous Positive Airway Pressure Ventilation , COPD COPD inhaler, Current Smoker,  breath sounds clear to auscultation- rhonchi        Cardiovascular Rhythm:Regular Rate:Normal     Neuro/Psych    GI/Hepatic GERD-  Medicated and Controlled,  Endo/Other  diabetes, Poorly Controlled, Type 2, Oral Hypoglycemic AgentsMorbid obesity  Renal/GU      Musculoskeletal   Abdominal   Peds  Hematology   Anesthesia Other Findings   Reproductive/Obstetrics                           Anesthesia Physical Anesthesia Plan  ASA: III  Anesthesia Plan: General   Post-op Pain Management:    Induction: Intravenous  Airway Management Planned: LMA  Additional Equipment:   Intra-op Plan:   Post-operative Plan: Extubation in OR  Informed Consent: I have reviewed the patients History and Physical, chart, labs and discussed the procedure including the risks, benefits and alternatives for the proposed anesthesia with the patient or authorized representative who has indicated his/her understanding and acceptance.     Plan Discussed with:   Anesthesia Plan Comments: (GOT possibility discussed and he agrees with plan.)        Anesthesia Quick Evaluation

## 2014-04-22 NOTE — Transfer of Care (Signed)
Immediate Anesthesia Transfer of Care Note  Patient: Joseph [name]  Procedure(s) Performed: Procedure(s): LEFT KNEE ARTHROSCOPY WITH MEDIAL MENISECTOMY (Left)  Patient Location: PACU  Anesthesia Type:General  Level of Consciousness: awake, alert  and oriented  Airway & Oxygen Therapy: Patient Spontanous Breathing and Patient connected to face mask oxygen  Post-op Assessment: Report given to PACU RN  Post vital signs: Reviewed and stable  Complications: No apparent anesthesia complications

## 2014-04-22 NOTE — Interval H&P Note (Signed)
History and Physical Interval Note:  04/22/2014 11:15 AM  Joseph [name]  has presented today for surgery, with the diagnosis of left medial meniscal tear  The various methods of treatment have been discussed with the patient and family. After consideration of risks, benefits and other options for treatment, the patient has consented to  Procedure(s): LEFT KNEE ARTHROSCOPY WITH MEDIAL MENISECTOMY (Left) as a surgical intervention .  The patient's history has been reviewed, patient examined, no change in status, stable for surgery.  I have reviewed the patient's chart and labs.  Questions were answered to the patient's satisfaction.     Vickki Hearing

## 2014-04-22 NOTE — Anesthesia Procedure Notes (Signed)
Procedure Name: LMA Insertion Date/Time: 04/22/2014 11:34 AM Performed by: Glynn Octave E Pre-anesthesia Checklist: Patient identified, Patient being monitored, Emergency Drugs available, Timeout performed and Suction available Patient Re-evaluated:Patient Re-evaluated prior to inductionOxygen Delivery Method: Circle System Utilized Preoxygenation: Pre-oxygenation with 100% oxygen Intubation Type: IV induction Ventilation: Mask ventilation without difficulty LMA: LMA inserted LMA Size: 4.0 Number of attempts: 1 Placement Confirmation: positive ETCO2 and breath sounds checked- equal and bilateral

## 2014-04-22 NOTE — Brief Op Note (Signed)
04/22/2014  12:20 PM  PATIENT:  Joseph [name]  43 y.o. male  PRE-OPERATIVE DIAGNOSIS:  left medial meniscal tear  POST-OPERATIVE DIAGNOSIS:  left medial meniscal tear  PROCEDURE:  Procedure(s): LEFT KNEE ARTHROSCOPY WITH MEDIAL MENISECTOMY (Left)  FINDINGS: Severe complex  tear medial meniscus WITH GRADE 1 CHONDROMALACIA OF THE MEDIAL FEMORAL CONDYLE AND GRADE 2 IN THE PATELLAR REGION  SURGEON:  Surgeon(s) and Role:    * Vickki Hearing, MD - Primary  PHYSICIAN ASSISTANT:   ASSISTANTS: none   ANESTHESIA:   general  EBL:  Total I/O In: 400 [I.V.:400] Out: 0   BLOOD ADMINISTERED:none  DRAINS: none   LOCAL MEDICATIONS USED:  MARCAINE     SPECIMEN:  No Specimen  DISPOSITION OF SPECIMEN:  N/A  COUNTS:  YES  TOURNIQUET:    DICTATION: .Dragon Dictation  PLAN OF CARE: Discharge to home after PACU  PATIENT DISPOSITION:  PACU - hemodynamically stable.   Delay start of Pharmacological VTE agent (>24hrs) due to surgical blood loss or risk of bleeding: not applicable  Preop diagnosis osteoarthritis torn medial meniscus LEFT  knee  Postop diagnosis same  Procedure arthroscopy LEFT  knee partial medial meniscectomy  Surgeon Romeo Apple  Anesthesia Gen.  Operative findings : MEDIAL complex tear medial meniscus posterior horn, grade 1 chondromalacia medial  LATERAL normal  PATELLA grade 2 chondromalacia patella median ridge  TROCHLEA normal   Indications for procedure pain mechanical symptoms unresponsive to nonoperative treatment  The patient was identified in the preop holding area as Joseph [name]  the LEFT  knee was confirmed as a surgical site and marked. The chart was reviewed  The patient was taken to the operating room and  was given appropriate preoperative antibiotic (VANCOMYCIN) and general anesthesia was administered. The operative leg (LEFT) was placed in the arthroscopic leg holder, the well leg was placed in a well leg holder  The LEFT  leg was then  prepped and draped sterile The surgical site was confirmed and the timeout procedure was completed  The lateral portal was injected with Marcaine with epinephrine solution and a stab wound was made. The scope was placed in the lateral portal into the medial compartment. The  Diagnostic portion of the  arthroscopy was completed. A medial portal was established in the same fashion and a probe was placed into the joint. The diagnostic arthroscopy   was repeated using a probe to palpate intra-articular structure  A duckbill forceps was used to morcellized the meniscal tear, the fragments were removed with a motorized shaver. The meniscus was then balanced with an ArthroCare wand 50 probe.  A probe was then used to confirm a stable rim.  The knee was irrigated meniscal fragments remaining were removed. The portals were closed with 3-0 nylon suture. The knee joint was then injected with 45 cc of Marcaine with epinephrine. A sterile dressing was applied followed by an Ace bandage and a Cryo/Cuff.  The patient was extubated and taken to the recovery room in stable condition.

## 2014-04-22 NOTE — Discharge Instructions (Signed)
Arthroscopic Procedure, Knee, Care After °Refer to this sheet in the next few weeks. These discharge instructions provide you with general information on caring for yourself after you leave the hospital. Your health care provider may also give you specific instructions. Your treatment has been planned according to the most current medical practices available, but unavoidable complications sometimes occur. If you have any problems or questions after discharge, please call your health care provider. °HOME CARE INSTRUCTIONS  °· It is normal to be sore for a couple days after surgery. See your health care provider if this seems to be getting worse rather than better. °· Only take over-the-counter or prescription medicines for pain, discomfort, or fever as directed by your health care provider. °· Take showers rather than baths, or as directed by your health care provider. °· Change bandages (dressings) if necessary or as directed. °· You may resume normal diet and activities as directed or allowed. °· Avoid lifting and driving until you are directed otherwise. °· Make an appointment to see your health care provider for stitches (suture) or staple removal as directed. °· You may put ice on the area. °· Put the ice in a plastic bag. Place a towel between your skin and the bag. °· Leave the ice on for 15 20 minutes, three to four times per day for the first 2 days. °· Elevate the knee above the level of your heart to reduce swelling, and avoid dangling the leg. °· Do 10-15 ankle pumps (pointing your toes toward you and then away from you) two to three times daily. °· If you are given compression stockings to wear after surgery, use them for as long as your surgeon tells you (around 10-14 days). °· Avoid smoking and exposure to second-hand smoke. °SEEK MEDICAL CARE IF:  °· You have increased bleeding from your wounds. °· You see redness or swelling or you have increasing pain in your wounds. °· You have pus coming from your  wound. °· You have a fever or persistent symptoms for more than 2 3 days. °· You notice a bad smell coming from the wound or dressing. °· You have severe pain with any motion of your knee. °SEEK IMMEDIATE MEDICAL CARE IF:  °· You develop a rash. °· You have difficulty breathing. °· You develop any reaction or side effects to medicines taken. °· You develop pain in the calves or back of the knee. °· You develop chest pain, shortness of breath, or difficulty breathing. °· You develop numbness or tingling in the leg or foot. °MAKE SURE YOU:  °· Understand these instructions. °· Will watch your condition. °· Will get help right away if you are not doing well or you get worse. °Document Released: 06/07/2005 Document Revised: 07/21/2013 Document Reviewed: 04/15/2013 °ExitCare® Patient Information ©2014 [employer]. °Cryotherapy °Cryotherapy is when you put ice on your injury. Ice helps lessen pain and puffiness (swelling) after an injury. Ice works the best when you start using it in the first 24 to 48 hours after an injury. °HOME CARE °· Put a dry or damp towel between the ice pack and your skin. °· You may press gently on the ice pack. °· Leave the ice on for no more than 10 to 20 minutes at a time. °· Check your skin after 5 minutes to make sure your skin is okay. °· Rest at least 20 minutes between ice pack uses. °· Stop using ice when your skin loses feeling (numbness). °· Do not use ice   on someone who cannot tell you when it hurts. This includes small children and people with memory problems (dementia). °GET HELP RIGHT AWAY IF: °· You have white spots on your skin. °· Your skin turns blue or pale. °· Your skin feels waxy or hard. °· Your puffiness gets worse. °MAKE SURE YOU:  °· Understand these instructions. °· Will watch your condition. °· Will get help right away if you are not doing well or get worse. °Document Released: 05/06/2008 Document Revised: 02/10/2012 Document Reviewed: 07/11/2011 °ExitCare® Patient  Information ©2014 [employer]. °General Anesthesia, Adult, Care After °Refer to this sheet in the next few weeks. These instructions provide you with information on caring for yourself after your procedure. Your health care provider may also give you more specific instructions. Your treatment has been planned according to current medical practices, but problems sometimes occur. Call your health care provider if you have any problems or questions after your procedure. °WHAT TO EXPECT AFTER THE PROCEDURE °After the procedure, it is typical to experience: °· Sleepiness. °· Nausea and vomiting. °HOME CARE INSTRUCTIONS °· For the first 24 hours after general anesthesia: °· Have a responsible person with you. °· Do not drive a car. If you are alone, do not take public transportation. °· Do not drink alcohol. °· Do not take medicine that has not been prescribed by your health care provider. °· Do not sign important papers or make important decisions. °· You may resume a normal diet and activities as directed by your health care provider. °· Change bandages (dressings) as directed. °· If you have questions or problems that seem related to general anesthesia, call the hospital and ask for the anesthetist or anesthesiologist on call. °SEEK MEDICAL CARE IF: °· You have nausea and vomiting that continue the day after anesthesia. °· You develop a rash. °SEEK IMMEDIATE MEDICAL CARE IF:  °· You have difficulty breathing. °· You have chest pain. °· You have any allergic problems. °Document Released: 02/24/2001 Document Revised: 07/21/2013 Document Reviewed: 06/03/2013 °ExitCare® Patient Information ©2014 [employer]. ° °

## 2014-04-22 NOTE — Op Note (Signed)
04/22/2014  12:20 PM  PATIENT:  Joseph [name]  42 y.o. male  PRE-OPERATIVE DIAGNOSIS:  left medial meniscal tear  POST-OPERATIVE DIAGNOSIS:  left medial meniscal tear  PROCEDURE:  Procedure(s): LEFT KNEE ARTHROSCOPY WITH MEDIAL MENISECTOMY (Left)  FINDINGS: Severe complex  tear medial meniscus WITH GRADE 1 CHONDROMALACIA OF THE MEDIAL FEMORAL CONDYLE AND GRADE 2 IN THE PATELLAR REGION  SURGEON:  Surgeon(s) and Role:    * Stanley E Harrison, MD - Primary  PHYSICIAN ASSISTANT:   ASSISTANTS: none   ANESTHESIA:   general  EBL:  Total I/O In: 400 [I.V.:400] Out: 0   BLOOD ADMINISTERED:none  DRAINS: none   LOCAL MEDICATIONS USED:  MARCAINE     SPECIMEN:  No Specimen  DISPOSITION OF SPECIMEN:  N/A  COUNTS:  YES  TOURNIQUET:    DICTATION: .Dragon Dictation  PLAN OF CARE: Discharge to home after PACU  PATIENT DISPOSITION:  PACU - hemodynamically stable.   Delay start of Pharmacological VTE agent (>24hrs) due to surgical blood loss or risk of bleeding: not applicable  Preop diagnosis osteoarthritis torn medial meniscus LEFT  knee  Postop diagnosis same  Procedure arthroscopy LEFT  knee partial medial meniscectomy  Surgeon Harrison  Anesthesia Gen.  Operative findings : MEDIAL complex tear medial meniscus posterior horn, grade 1 chondromalacia medial  LATERAL normal  PATELLA grade 2 chondromalacia patella median ridge  TROCHLEA normal   Indications for procedure pain mechanical symptoms unresponsive to nonoperative treatment  The patient was identified in the preop holding area as Joseph [name]  the LEFT  knee was confirmed as a surgical site and marked. The chart was reviewed  The patient was taken to the operating room and  was given appropriate preoperative antibiotic (VANCOMYCIN) and general anesthesia was administered. The operative leg (LEFT) was placed in the arthroscopic leg holder, the well leg was placed in a well leg holder  The LEFT  leg was then  prepped and draped sterile The surgical site was confirmed and the timeout procedure was completed  The lateral portal was injected with Marcaine with epinephrine solution and a stab wound was made. The scope was placed in the lateral portal into the medial compartment. The  Diagnostic portion of the  arthroscopy was completed. A medial portal was established in the same fashion and a probe was placed into the joint. The diagnostic arthroscopy   was repeated using a probe to palpate intra-articular structure  A duckbill forceps was used to morcellized the meniscal tear, the fragments were removed with a motorized shaver. The meniscus was then balanced with an ArthroCare wand 50 probe.  A probe was then used to confirm a stable rim.  The knee was irrigated meniscal fragments remaining were removed. The portals were closed with 3-0 nylon suture. The knee joint was then injected with 45 cc of Marcaine with epinephrine. A sterile dressing was applied followed by an Ace bandage and a Cryo/Cuff.  The patient was extubated and taken to the recovery room in stable condition.  

## 2014-04-22 NOTE — Anesthesia Postprocedure Evaluation (Signed)
  Anesthesia Post-op Note  Patient: Joseph [name]  Procedure(s) Performed: Procedure(s): LEFT KNEE ARTHROSCOPY WITH MEDIAL MENISECTOMY (Left)  Patient Location: PACU  Anesthesia Type:General  Level of Consciousness: awake, alert  and oriented  Airway and Oxygen Therapy: Patient Spontanous Breathing and Patient connected to face mask oxygen  Post-op Pain: none  Post-op Assessment: Post-op Vital signs reviewed, Patient's Cardiovascular Status Stable, Respiratory Function Stable, Patent Airway and No signs of Nausea or vomiting  Post-op Vital Signs: Reviewed and stable  Last Vitals:  Filed Vitals:   04/22/14 1122  BP: 116/77  Pulse:   Temp:   Resp: 18    Complications: No apparent anesthesia complications

## 2014-04-26 ENCOUNTER — Encounter: Payer: Self-pay | Admitting: Orthopedic Surgery

## 2014-04-26 ENCOUNTER — Ambulatory Visit (INDEPENDENT_AMBULATORY_CARE_PROVIDER_SITE_OTHER): Payer: BC Managed Care – PPO | Admitting: Orthopedic Surgery

## 2014-04-26 VITALS — BP 160/97 | Ht 70.0 in | Wt 287.0 lb

## 2014-04-26 DIAGNOSIS — S83207A Unspecified tear of unspecified meniscus, current injury, left knee, initial encounter: Secondary | ICD-10-CM

## 2014-04-26 DIAGNOSIS — Z9889 Other specified postprocedural states: Secondary | ICD-10-CM

## 2014-04-26 DIAGNOSIS — S83207S Unspecified tear of unspecified meniscus, current injury, left knee, sequela: Secondary | ICD-10-CM | POA: Insufficient documentation

## 2014-04-26 DIAGNOSIS — IMO0002 Reserved for concepts with insufficient information to code with codable children: Secondary | ICD-10-CM

## 2014-04-26 HISTORY — DX: Other specified postprocedural states: Z98.890

## 2014-04-26 MED ORDER — OXYCODONE-ACETAMINOPHEN 5-325 MG PO TABS: 1.0000 | ORAL_TABLET | ORAL | Status: DC | PRN

## 2014-04-26 NOTE — Progress Notes (Signed)
Patient ID: Joseph [name], male   DOB: 09/04/1971, 43 y.o.   MRN: [id number]  Chief Complaint  Patient presents with  . Follow-up    Post op # 1 SALK DOS 04/22/14    PRE-OPERATIVE DIAGNOSIS:  left medial meniscal tear  POST-OPERATIVE DIAGNOSIS:  left medial meniscal tear  PROCEDURE:  Procedure(s): LEFT KNEE ARTHROSCOPY WITH MEDIAL MENISECTOMY (Left)  FINDINGS: Severe complex  tear medial meniscus WITH GRADE 1 CHONDROMALACIA OF THE MEDIAL FEMORAL CONDYLE AND GRADE 2 IN THE PATELLAR REGION   SURGEON:  Surgeon(s) and Role:    * Vickki Hearing, MD - Primary   He is doing very well he has mild discomfort. He can start therapy. The suture lines looked good. Portal sites are clean.  Recommend therapy come back in 3-4 weeks

## 2014-04-26 NOTE — Patient Instructions (Signed)
Call to arrange therapy 

## 2014-04-27 ENCOUNTER — Encounter (HOSPITAL_COMMUNITY): Payer: Self-pay | Admitting: Orthopedic Surgery

## 2014-05-10 ENCOUNTER — Ambulatory Visit (HOSPITAL_COMMUNITY): Payer: BC Managed Care – PPO | Admitting: Physical Therapy

## 2014-05-10 ENCOUNTER — Telehealth (HOSPITAL_COMMUNITY): Payer: Self-pay

## 2014-05-17 ENCOUNTER — Encounter: Payer: Self-pay | Admitting: Orthopedic Surgery

## 2014-05-17 ENCOUNTER — Ambulatory Visit (INDEPENDENT_AMBULATORY_CARE_PROVIDER_SITE_OTHER): Payer: BC Managed Care – PPO | Admitting: Orthopedic Surgery

## 2014-05-17 VITALS — BP 152/99 | Ht 70.0 in | Wt 287.0 lb

## 2014-05-17 DIAGNOSIS — Z9889 Other specified postprocedural states: Secondary | ICD-10-CM

## 2014-05-17 DIAGNOSIS — IMO0002 Reserved for concepts with insufficient information to code with codable children: Secondary | ICD-10-CM

## 2014-05-17 DIAGNOSIS — S83207A Unspecified tear of unspecified meniscus, current injury, left knee, initial encounter: Secondary | ICD-10-CM

## 2014-05-17 NOTE — Progress Notes (Signed)
Patient ID: Joseph [name], male   DOB: 13-Aug-1971, 43 y.o.   MRN: [id number]  Chief Complaint  Patient presents with  . Follow-up    Post op # 2 SALK DOS 04/22/14    BP 152/99  Ht 5\' 10"  (1.778 m)  Wt 287 lb (130.182 kg)  BMI 41.18 kg/m2  PROCEDURE:  Procedure(s): LEFT KNEE ARTHROSCOPY WITH MEDIAL MENISECTOMY (Left)  FINDINGS: Severe complex  tear medial meniscus WITH GRADE 1 CHONDROMALACIA OF THE MEDIAL FEMORAL CONDYLE AND GRADE 2 IN THE PATELLAR REGION   SURGEON:  Surgeon(s) and Role:  The patient is doing well he is pain-free he has full range of motion has a normal straight leg raise he has no joint effusion  Is discharged to normal activity

## 2014-05-18 ENCOUNTER — Ambulatory Visit (HOSPITAL_COMMUNITY): Payer: BC Managed Care – PPO | Attending: Orthopedic Surgery | Admitting: Physical Therapy

## 2014-05-26 ENCOUNTER — Other Ambulatory Visit: Payer: Self-pay | Admitting: *Deleted

## 2014-05-26 NOTE — Telephone Encounter (Signed)
Ok plus two monthly ref 

## 2014-05-26 NOTE — Telephone Encounter (Signed)
[name] from [facility] was calling to see if it was OK to refill Joseph [name]'s Xanax and Ativan.   Joseph PoagAndy [facility] [phone]

## 2014-05-27 NOTE — Telephone Encounter (Signed)
I misunderstood. Pt does not need a refill, [facility] was calling to make sure that it was OK for the patient to be on both meds. I told them Dr. Brett CanalesSteve said it was "Ok".

## 2014-05-27 NOTE — Addendum Note (Signed)
Addended byOneal Deputy: MCMANUS, KACEY D on: 05/27/2014 10:28 AM   Modules accepted: Orders

## 2014-05-27 NOTE — Telephone Encounter (Signed)
Ok for both plus two ref 

## 2014-05-31 ENCOUNTER — Other Ambulatory Visit: Payer: Self-pay | Admitting: Family Medicine

## 2014-06-02 ENCOUNTER — Ambulatory Visit: Payer: BC Managed Care – PPO | Admitting: Family Medicine

## 2014-06-30 ENCOUNTER — Other Ambulatory Visit: Payer: Self-pay | Admitting: Family Medicine

## 2014-06-30 ENCOUNTER — Encounter: Payer: Self-pay | Admitting: Family Medicine

## 2014-06-30 ENCOUNTER — Ambulatory Visit (INDEPENDENT_AMBULATORY_CARE_PROVIDER_SITE_OTHER): Payer: BC Managed Care – PPO | Admitting: Family Medicine

## 2014-06-30 VITALS — BP 128/86 | Ht 70.0 in | Wt 287.0 lb

## 2014-06-30 DIAGNOSIS — Z79899 Other long term (current) drug therapy: Secondary | ICD-10-CM

## 2014-06-30 DIAGNOSIS — E119 Type 2 diabetes mellitus without complications: Secondary | ICD-10-CM

## 2014-06-30 DIAGNOSIS — R0609 Other forms of dyspnea: Secondary | ICD-10-CM

## 2014-06-30 DIAGNOSIS — R7989 Other specified abnormal findings of blood chemistry: Secondary | ICD-10-CM

## 2014-06-30 DIAGNOSIS — R0600 Dyspnea, unspecified: Secondary | ICD-10-CM

## 2014-06-30 DIAGNOSIS — J441 Chronic obstructive pulmonary disease with (acute) exacerbation: Secondary | ICD-10-CM

## 2014-06-30 DIAGNOSIS — R0989 Other specified symptoms and signs involving the circulatory and respiratory systems: Secondary | ICD-10-CM

## 2014-06-30 DIAGNOSIS — E291 Testicular hypofunction: Secondary | ICD-10-CM

## 2014-06-30 DIAGNOSIS — G894 Chronic pain syndrome: Secondary | ICD-10-CM

## 2014-06-30 DIAGNOSIS — E782 Mixed hyperlipidemia: Secondary | ICD-10-CM

## 2014-06-30 MED ORDER — SULFAMETHOXAZOLE-TMP DS 800-160 MG PO TABS: 1.0000 | ORAL_TABLET | Freq: Two times a day (BID) | ORAL | Status: DC

## 2014-06-30 MED ORDER — OXYCODONE-ACETAMINOPHEN 5-325 MG PO TABS: 1.0000 | ORAL_TABLET | Freq: Three times a day (TID) | ORAL | Status: DC | PRN

## 2014-06-30 NOTE — Progress Notes (Signed)
   Subjective:    Patient ID: Joseph ForthJerry R Thum, male    DOB: 1971/09/07, 43 y.o.   MRN: [id number]  HPI  This patient was seen today for chronic pain  The medication list was reviewed and updated.   -Compliance with pain medication: Was on Hydrocodone 5/325 TID but was given Oxycodone 5/325  for knee surgery and got better pain control and would like to switch to Oxycodone for chronic pain management.  The patient was advised the importance of maintaining medication and not using illegal substances with these.  Refills needed: would like Rxs for Oxycodone   The patient was educated that we can provide 3 monthly scripts for their medication, it is their responsibility to follow the instructions.  Side effects or complications from medications: no  Patient is aware that pain medications are meant to minimize the severity of the pain to allow their pain levels to improve to allow for better function. They are aware of that pain medications cannot totally remove their pain.  Due for UDT ( at least once per year) :   Patient also has diabetes. Type II in nature. He claims compliance with his medicine. Checks his sugars occasionally most fasting sugars running in the low 100s.  Patient has developed lesions in his scalp. Started as small bumps. Now crusty somewhat.   Patient is on chronic testosterone supplementation. This was initiated originally 4 fatigue and erectile dysfunction according to criteria at that time. Review of Systems No headache no chest pain chronic back pain. Chronic right shoulder pain no abdominal pain no change in bowel habits.    Objective:   Physical Exam Alert vitals stable HEENT normal. Lungs diminished breath sounds diffusely. No wheezes no crackles heart regular in rhythm. Ankles without edema. C. diabetic foot exam. Significant pain with rotation of shoulder. Impression 1 chronic pain. Scalp folliculitis with impetigo-like lesions      Assessment & Plan:    Impression 1 chronic pain #2 type 2 diabetes decent control. By history. #3 impetigo. #4 COPD discussed. 5 sleep apnea ongoing stable on intervention. #6 testosterone supplement. Very long discussion held. I have advised patient I no longer putting you patient's on it. Rationale explained at great length. Patient despite increased risks would like to maintain supplementation.. Plan appropriate blood work. Bactrim DS twice a day 10 days. Symptomatic care discussed. Pain medicines filled out. Recheck in several months. Easily 40 minutes spent in working with this complex patient. WSL

## 2014-07-01 DIAGNOSIS — E349 Endocrine disorder, unspecified: Secondary | ICD-10-CM | POA: Insufficient documentation

## 2014-07-16 ENCOUNTER — Other Ambulatory Visit: Payer: Self-pay | Admitting: Family Medicine

## 2014-07-18 NOTE — Telephone Encounter (Signed)
Seen 06/30/14

## 2014-07-22 ENCOUNTER — Ambulatory Visit (INDEPENDENT_AMBULATORY_CARE_PROVIDER_SITE_OTHER): Payer: BC Managed Care – PPO | Admitting: Family Medicine

## 2014-07-22 ENCOUNTER — Encounter: Payer: Self-pay | Admitting: Family Medicine

## 2014-07-22 VITALS — BP 128/80 | Temp 99.1°F | Ht 70.0 in | Wt 280.0 lb

## 2014-07-22 DIAGNOSIS — R21 Rash and other nonspecific skin eruption: Secondary | ICD-10-CM

## 2014-07-22 MED ORDER — TESTOSTERONE 20.25 MG/ACT (1.62%) TD GEL: TRANSDERMAL | Status: DC

## 2014-07-22 MED ORDER — CLINDAMYCIN HCL 300 MG PO CAPS: 300.0000 mg | ORAL_CAPSULE | Freq: Three times a day (TID) | ORAL | Status: DC

## 2014-07-22 NOTE — Progress Notes (Signed)
   Subjective:    Patient ID: Joseph [name], male    DOB: 04-Nov-1971, 43 y.o.   MRN: [id number]  HPIPossible spider bite on left leg last week. Now having body aches, low grade fever.  Spot on left calf started as red bump. Then expanded. Now is developed a central crusty area and tenderness. No frank discharge. No fever no chills. Patient just assumed it was a spider bite.   Lesions in scalp. Prescribed bactrim ds not any better actually lesions improved temporarily but then worsened once again.Marland Kitchen.   Refill on androgel.  Patient has artery had discussion regarding potential risks.   Review of Systems No headache no chest pain no back pain no abdominal pain ROS otherwise negative    Objective:   Physical Exam Alert no major distress. Multiple crusty impetigo-like lesions in scalp. Lungs clear. Heart rare rhythm. Left leg central infected lesion       Assessment & Plan:  Impression impetigo/skin structure infections discuss plan numerous medicine allergies lead to a Cleocin prescription for 10 days. Local measures discussed. WSL

## 2014-07-25 ENCOUNTER — Telehealth: Payer: Self-pay | Admitting: Internal Medicine

## 2014-07-25 MED ORDER — MOMETASONE FURO-FORMOTEROL FUM 200-5 MCG/ACT IN AERO: 2.0000 | INHALATION_SPRAY | Freq: Two times a day (BID) | RESPIRATORY_TRACT | Status: DC

## 2014-07-25 NOTE — Telephone Encounter (Signed)
Spoke with the pt  I advised will leave 1 sample of Dulera 200  I advised that when he comes back in for ov to bring drug formulary  Pt verbalized understanding

## 2014-07-27 ENCOUNTER — Encounter: Payer: Self-pay | Admitting: Internal Medicine

## 2014-07-27 ENCOUNTER — Ambulatory Visit (INDEPENDENT_AMBULATORY_CARE_PROVIDER_SITE_OTHER): Payer: BC Managed Care – PPO | Admitting: Internal Medicine

## 2014-07-27 VITALS — BP 110/80 | HR 107 | Temp 98.8°F | Ht 70.0 in | Wt 283.6 lb

## 2014-07-27 DIAGNOSIS — J4530 Mild persistent asthma, uncomplicated: Secondary | ICD-10-CM

## 2014-07-27 DIAGNOSIS — J45909 Unspecified asthma, uncomplicated: Secondary | ICD-10-CM

## 2014-07-27 DIAGNOSIS — F17200 Nicotine dependence, unspecified, uncomplicated: Secondary | ICD-10-CM

## 2014-07-27 NOTE — Patient Instructions (Addendum)
Plan A=  dulera 200 Take 2 puffs first thing in am and then another 2 puffs about 12 hours later (or formulary alternative- call us if you find a cheaper one)   Plan B =  Backup = proaire   Plan C = Backup plan B = nebulized albuterol up to 4 hours but call if needing this much at all  The key is to stop smoking completely before smoking completely stops you!   Schedule appt with one of our sleep doctors at your convenience

## 2014-07-27 NOTE — Progress Notes (Signed)
Subjective:   Patient ID: Joseph [name], male    DOB: 1971-08-28   MRN: [id number]    Brief patient profile:  1 yowm active smoker without significant airflow obst by PFTs 06/02/12    History of Present Illness  IOV 04/10/12  43 year old male.  reports that he has been smoking Cigarettes.  He has a 40 pack-year smoking history; 2 ppd since age 68. He does not have any smokeless tobacco history on file. Body mass index is 39.97 kg/(m^2) (40# weight gain in 4 years) [name], MD, MD is pcp. At basline has dx of chronic percocet use, OSA on CPAP, Morbid obesity and COPD since early 2002 (has hx of spirometry back then but result NA). Also, hx of pyrethrin inhalation through work in [employer] 1992 - 1999. Currently unemployed. Has had dx of pna in past. Also reports strong hx of breathing issues in family.   In April 2013 reports he had pneumonia apparently. Went to [facility] ER and found some abnormality (nodule based on review of ER notes) on CXR that resulted in CT Angio 03/07/12 same visit. PE ruled out but found to have mediastinal nodes but the cxr nodule was not there. Apparently ER doctor said it was lymphoma and asked to see an oncologist. But primary care doctor felt that was over zealous and referred patient here. He prefers conservative mgmt to nodes if clinical suspicion for cancer is low  Currently, feels baseline which is lack of energy (spent most of days yesterday in bed), esp in pollen, chronic dyspnea on exertion for 180 feet relieved by rest  But slowly progressive since 2002 for which he uses albuterol prn on average 4 times a day.  Walkt test 185 feet x 3 laps: no desaturation  He smokes at baseline; trying quit, prior intolerance (dreams) with chantix.   Only baseline mild chronic cough with associtaed wheeze that is occasional.. Does have some B symptoms like nocturnal diaphoresis (soaks pillows). Denies weight loss.   CT ANGIOGRAPHY CHEST  Comparison: 03/07/2012  An  abnormal right lower paratracheal node has a short axis diameter  of 1.6 cm. An abnormal AP window lymph node has a short axis  diameter of 1.4 cm. Mild bilateral hilar and infrahilar adenopathy  noted. Scattered small axillary lymph nodes are present.  A subcarinal node has a short axis diameter of 1.5 cm.   No filling defect is identified in the pulmonary arterial tree to  suggest pulmonary embolus.   There is diffuse steatosis of the visualized portion of the liver.   No pericardial effusion noted. No aortic dissection is observed.   Airway thickening noted. In the area of previous concern for right  basilar pulmonary nodule, no nodule is seen. This may have  represented a confluence of vascular shadows or a skin fold.  Old, healed right ninth rib fracture noted. Mild thoracic  spondylosis is present.  I Original Report Authenticated By: Dellia Cloud, M.D   #MEdiastinal node  - suspicion for cancer is low  - please do blood test for ACE level to help give [place] some clue if this could be due to sarcoid or not  - Please have PET scan in 2 months for followup of the mediastinal nodes  #Smoking  - glad you are working on quitting  #COPD history and shortness of breath  - have full pft at followup in 2 months  - nurse will walk you for oxygen levels now  #FOllowup  -  2 months with PET scan and full PFT at followup    OV 08/14/2012   Mediastinal nodes: PET scan 06/10/12 shows very low uptake in the mediastinal nodes. Suspicion for cancer is very low. Lymph nodes include 13 mm right lower paratracheal lymph node and 10 mm prevascular lymph node. ACE level 04/10/12  - is in 20s and normal    Smoking: switched to e-cigs with  Nicotine to help quit. Now 50% e-cigs and 1 pack regular cigs which is 50% down   Still dyspneic: 1 flught of steps and better with rest and with albuterol and atrovent prn. Dyspne also worse in winter and cooler weathers. Rates it as moderate. STable  since last visit to slightly worse. thre is some associtaed cough. No asociated weight loss. COugh is moderate and worst early in morning and in cooler temperatues.. Denies sinus drainage. PFts show mixed picture - fvc 3.4L/69%, fev1 2.9/76%, Ratio 85 but 12% bd respose. TLC 5.7/83%. DLCO 23.7/69% rec rec #MEdiastinal node  - suspicion for cancer is low  - you will need CT chest in a year; we will schedule it later  #Smoking  - glad you are working on quitting  #Shortness of breath - this is due to asthma/copd and weight  - please start QVAR 2 puff twice daily - take sample and show technique and take prescription too  - focus on weight loss through the low glycemic diet; take diet sheet from [place]  #FOllowup  - 3 months with spirometry at followup  - flu shot and pneumovax through PMD as discussed   12/17/2013  acute ov/Romelle Reiley still active smoker re: recurrent pattern of coughing x a decade intermittent / out of control since nov 2014/ can't take symbicort makes it worse / on percocet one tid at baseline  Chief Complaint  Patient presents with  . Acute Visit    MR pt.  C/o mostly nonprod cough with some thick white mucous, coughing until he almost passes out.  Went to [employer] last week.    duoneb daily  Sometime skips the doses later in the day but always uses the am dose, sputum to purulent. Mostly sob with cough/ otherwise breaths ok Last neb was 4 h prior to OV   rec For Cough - use the flutter valve as much as you can plus mucinex dm 1200 mg every 12 hours as needed supplement with percocet up to 2 every 4hours For Breathing  Only use your duoneb  rescue medication . - Ok to use up to    every 4 hours if you must  Prednisone 10 mg take  4 each am x 2 days,   2 each am x 2 days,  1 each am x 2 days and stop  Pantoprazole (protonix) 40 mg   Take 30-60 min before first meal of the day and Pepcid 20 mg one bedtime until return to office - this is the best way to tell whether stomach  acid is contributing to your problem.  GERD  Diet . Please schedule a follow up office visit in 2 weeks, sooner if needed with all meds in hand   12/31/2013 f/u ov/Evalee Gerard re: last duoneb  2h prior to OV = takes  First thing in am / did not bring all meds Chief Complaint  Patient presents with  . Follow-up    Pt reports cough had improved while on pred, but is worse since stopped med. No new co's today. Out of percocet.      >  Dulera 100 2bid  , cough control w/ percocet.   01/14/2014 Follow up and Med Review  Returns for follow up and Med review .  Reports breathing is improved since beginning the [facility]. We reviewed all his medications. Organized them into a medication calendar with patient education. Appears that he is taking his medications correctly rec Stop Ipratropium Neb .  May use Albuterol Neb every 4hrs as needed for wheezing/shortness of breath.  02/28/2014 f/u ov/Ralene Gasparyan re: still smoking / AB but no chronic airflow obst by pfts Chief Complaint  Patient presents with  . Follow-up    Pt states that his breathing is much improved on Dulera. No new co's today.   Not limited by breathing from desired activities  - no need for saba but freq congested cough esp in ams but does not typcially awaken him prematurely. rec Dulera increase to 200 Take 2 puffs first thing in am and then another 2 puffs about 12 hours later.  Work on [employer], commercial:  The key is to stop smoking completely before smoking completely stops you!    07/27/2014 f/u ov/Stefannie Defeo re: asthma / still smoking  Chief Complaint  Patient presents with  . Follow-up    Pt c/o increased SOB and cough for the past month- cough is occ prod with minimal clear sputum. Pt currently taking clindamycin for staph infection. Rarely using albuterol inhaler, but is using neb approx 4 times per day.   off dulera x 1.5 week denies more sob/ cough or need for rescue on vs off  No obvious day to day or daytime variabilty or  assoc cp or chest tightness, subjective wheeze overt sinus or hb symptoms. No unusual exp hx or h/o childhood pna/ asthma or knowledge of premature birth.  Sleeping ok without nocturnal  or early am exacerbation  of respiratory  c/o's or need for noct saba. Also denies any obvious fluctuation of symptoms with weather or environmental changes or other aggravating or alleviating factors except as outlined above   Current Medications, Allergies, Complete Past Medical History, Past Surgical History, Family History, and Social History were reviewed in [employer] record.  ROS  The following are not active complaints unless bolded sore throat, dysphagia, dental problems, itching, sneezing,  nasal congestion or excess/ purulent secretions, ear ache,   fever, chills, sweats, unintended wt loss, pleuritic or exertional cp, hemoptysis,  orthopnea pnd or leg swelling, presyncope, palpitations, heartburn, abdominal pain, anorexia, nausea, vomiting, diarrhea  or change in bowel or urinary habits, change in stools or urine, dysuria,hematuria,  rash, arthralgias, visual complaints, headache, numbness weakness or ataxia or problems with walking or coordination,  change in mood/affect or memory.                    Objective:   Physical Exam  amb obese bm nad    12/31/2013       294  > 294 01/14/2014 > 02/28/2014  292 >  07/27/2014  286      HEENT: nl dentition, turbinates, and orophanx. Nl external ear canals without cough reflex   NECK :  without JVD/Nodes/TM/ nl carotid upstrokes bilaterally   LUNGS: no acc muscle use, clear to A and P bilaterally with mid exp rhonchi bilaterally    CV:  RRR  no s3 or murmur or increase in P2, no edema   ABD:  soft and nontender with nl excursion in the supine position. No bruits or organomegaly, bowel sounds nl  MS:  warm without deformities, calf tenderness, cyanosis or clubbing  SKIN: warm and dry without lesions    NEURO:  alert,  approp, no deficits          12/09/13  cxr No active cardiopulmonary disease.             Assessment & Plan:

## 2014-07-29 NOTE — Assessment & Plan Note (Signed)
without significant airflow obst by PFTs 06/02/12   > 3  Min  > 3 min discussion I reviewed the Flethcher curve with patient that basically indicates  if you quit smoking when your best day FEV1 is still well preserved (as is the case here)  it is highly unlikely you will progress to severe disease and informed the patient there was no medication on the market that has proven to change the curve or the likelihood of progression.  Therefore stopping smoking and maintaining abstinence is the most important aspect of care, not choice of inhalers or for that matter, doctors.

## 2014-07-29 NOTE — Assessment & Plan Note (Signed)
DDX of  difficult airways management all start with A and  include Adherence, Ace Inhibitors, Acid Reflux, Active Sinus Disease, Alpha 1 Antitripsin deficiency, Anxiety masquerading as Airways dz,  ABPA,  allergy(esp in young), Aspiration (esp in elderly), Adverse effects of DPI,  Active smokers, plus two Bs  = Bronchiectasis and Beta blocker use..and one C= CHF  Adherence is always the initial "prime suspect" and is a multilayered concern that requires a "trust but verify" approach in every patient - starting with knowing how to use medications, especially inhalers, correctly, keeping up with refills and understanding the fundamental difference between maintenance and prns vs those medications only taken for a very short course and then stopped and not refilled.  The proper method of use, as well as anticipated side effects, of a metered-dose inhaler are discussed and demonstrated to the patient. Improved effectiveness after extensive coaching during this visit to a level of approximately  75% > continue dulera 200 bid  Active smoking greatest other concern > discussed separately

## 2014-08-18 DIAGNOSIS — Z0289 Encounter for other administrative examinations: Secondary | ICD-10-CM

## 2014-08-31 ENCOUNTER — Encounter: Payer: Self-pay | Admitting: Pulmonary Disease

## 2014-08-31 ENCOUNTER — Ambulatory Visit (INDEPENDENT_AMBULATORY_CARE_PROVIDER_SITE_OTHER): Payer: BC Managed Care – PPO | Admitting: Pulmonary Disease

## 2014-08-31 VITALS — BP 150/98 | HR 86 | Ht 70.0 in | Wt 280.0 lb

## 2014-08-31 DIAGNOSIS — G4733 Obstructive sleep apnea (adult) (pediatric): Secondary | ICD-10-CM | POA: Insufficient documentation

## 2014-08-31 LAB — HM DIABETES EYE EXAM

## 2014-08-31 NOTE — Assessment & Plan Note (Addendum)
CPAP supplies will be provided- incl new FF mask -[facility] , [place] CPAP titration study - we will then make adjustments to your cpap pressure  Weight loss encouraged, compliance with goal of at least 4-6 hrs every night is the expectation. Advised against medications with sedative side effects Cautioned against driving when sleepy - understanding that sleepiness will vary on a day to day basis

## 2014-08-31 NOTE — Patient Instructions (Signed)
CPAP supplies will be provided CPAP titration study - we will then make adjustments to your cpap pressure

## 2014-08-31 NOTE — Progress Notes (Signed)
Subjective:    Patient ID: Joseph [name], male    DOB: [birth year], 43 y.o.   MRN: [id number]  HPI  43 year old disabled smoker, presents for evaluation of sleep disordered breathing. He needs pressure to be desat on his CPAP machine, and needs supplies He sees my partners for chronic cough-and is maintained on dulera for COPD, of note he is 'allergic' to Symbicort. NPSG in 10/2011 -showed moderate OSA with AHI of 19 per hour and lowest desaturation of 86%. This was corrected by CPAP of 8 cm with a full face mask. He has 2 CPAP machines- both obtained from his relations, he is set at 9 cm, he uses a [employer] fullface mask. He complains of left facial swelling under his eye on waking up, but reports good compliance. Bedtime is around 10 PM, he uses the Percocet and Xanax at bedtime for the past 6 years, sleeps on his side with 2 pillows, sleep latency up to an hour, reports 2-3 nocturnal awakenings and is out of bed by 10 AM, feeling rested with occasional dryness of mouth. No snoring has been noted by his wife. Epworth sleepiness score is 1  Past Medical History  Diagnosis Date  . Diabetes mellitus   . COPD (chronic obstructive pulmonary disease)   . Sleep apnea   . Asthma   . Chronic pain of left knee   . Chronic back pain   . Shortness of breath   . GERD (gastroesophageal reflux disease)   . Arthritis     Past Surgical History  Procedure Laterality Date  . Carpal tunnel release Left   . Shoulder arthrocentesis Right   . Vasectomy    . Hernia repair Bilateral   . Knee arthroscopy with medial menisectomy Left 04/22/2014    Procedure: LEFT KNEE ARTHROSCOPY WITH MEDIAL MENISECTOMY;  Surgeon: Vickki Hearing, MD;  Location: [facility];  Service: Orthopedics;  Laterality: Left;    Allergies  Allergen Reactions  . Azithromycin Hives  . Erythromycin Hives  . Keflex [Cephalexin] Nausea Only  . Metformin Nausea And Vomiting  . Symbicort [Budesonide-Formoterol Fumarate] Other (See  Comments)    States that 3 doses were used and breathing became worse  . Doxycycline Rash    History   Social History  . Marital Status: Married    Spouse Name: N/A    Number of Children: N/A  . Years of Education: N/A   Occupational History  . disability    Social History Main Topics  . Smoking status: Current Every Day Smoker -- 1.50 packs/day for 20 years    Types: Cigarettes    Start date: 12/17/1993  . Smokeless tobacco: Current User     Comment: using vap  . Alcohol Use: No  . Drug Use: No  . Sexual Activity: Yes    Birth Control/ Protection: Surgical   Other Topics Concern  . Not on file   Social History Narrative  . No narrative on file    Family History  Problem Relation Age of Onset  . Emphysema Maternal Grandfather   . Emphysema Maternal Grandmother   . Clotting disorder Maternal Grandmother      Review of Systems  Constitutional: Negative for fever and unexpected weight change.  HENT: Positive for congestion. Negative for dental problem, ear pain, nosebleeds, postnasal drip, rhinorrhea, sinus pressure, sneezing, sore throat and trouble swallowing.   Eyes: Negative for redness and itching.  Respiratory: Positive for cough, chest tightness and shortness of breath. Negative for wheezing.  Cardiovascular: Positive for palpitations and leg swelling.  Gastrointestinal: Negative for nausea and vomiting.  Genitourinary: Negative for dysuria.  Musculoskeletal: Negative for joint swelling.  Skin: Negative for rash.  Neurological: Negative for headaches.  Hematological: Does not bruise/bleed easily.  Psychiatric/Behavioral: Negative for dysphoric mood. The patient is not nervous/anxious.        Objective:   Physical Exam  Gen. Pleasant, obese, in no distress, normal affect ENT - no lesions, no post nasal drip, class 2-3 airway Neck: No JVD, no thyromegaly, no carotid bruits Lungs: no use of accessory muscles, no dullness to percussion, decreased  without rales or rhonchi  Cardiovascular: Rhythm regular, heart sounds  normal, no murmurs or gallops, no peripheral edema Abdomen: soft and non-tender, no hepatosplenomegaly, BS normal. Musculoskeletal: No deformities, no cyanosis or clubbing Neuro:  alert, non focal, no tremors       Assessment & Plan:

## 2014-09-03 ENCOUNTER — Other Ambulatory Visit: Payer: Self-pay | Admitting: Family Medicine

## 2014-09-05 ENCOUNTER — Ambulatory Visit (INDEPENDENT_AMBULATORY_CARE_PROVIDER_SITE_OTHER): Payer: BC Managed Care – PPO | Admitting: Family Medicine

## 2014-09-05 ENCOUNTER — Encounter: Payer: Self-pay | Admitting: Family Medicine

## 2014-09-05 VITALS — BP 120/80 | Temp 98.3°F | Ht 70.0 in | Wt 282.0 lb

## 2014-09-05 DIAGNOSIS — J329 Chronic sinusitis, unspecified: Secondary | ICD-10-CM

## 2014-09-05 MED ORDER — AMOXICILLIN-POT CLAVULANATE 875-125 MG PO TABS: 1.0000 | ORAL_TABLET | Freq: Two times a day (BID) | ORAL | Status: AC

## 2014-09-05 NOTE — Progress Notes (Signed)
   Subjective:    Patient ID: Joseph [name], male    DOB: 06-28-1971, 43 y.o.   MRN: [id number]  Sinusitis This is a new problem. The current episode started in the past 7 days. The problem is unchanged. There has been no fever. The pain is moderate. Associated symptoms include congestion, coughing, headaches and sinus pressure. Past treatments include oral decongestants. The treatment provided no relief.   Patient states he has no other concerns at this time.   Headache fronatal  Energy level not good  Nasal discharge  No low gr fevr  alsbuterol  Fair amnt of wheezing  A lot of cough  Using the inhaler vrery a lot  No vom no diarrhea   Review of Systems  HENT: Positive for congestion and sinus pressure.   Respiratory: Positive for cough.   Neurological: Positive for headaches.       Objective:   Physical Exam  Alert mild malaise. Vital stable. Nasal congestion frontal tenderness. Intermittent cough during exam. Pharynx normal lungs rare wheeze no tachypnea no crackles heart regular in rhythm.     Assessment & Plan:  Impression rhinosinusitis/bronchitis with element of reactive airways plan antibiotics prescribed. Albuterol when necessary. Symptomatic care discussed. WSL

## 2014-09-06 ENCOUNTER — Telehealth: Payer: Self-pay | Admitting: Family Medicine

## 2014-09-06 MED ORDER — BENZONATATE 100 MG PO CAPS: 100.0000 mg | ORAL_CAPSULE | Freq: Four times a day (QID) | ORAL | Status: DC | PRN

## 2014-09-06 NOTE — Telephone Encounter (Signed)
Tess perles 100 mg numb thirty one q 6hr prn

## 2014-09-06 NOTE — Telephone Encounter (Signed)
Rx sent electronically to pharmacy. Patient notified. 

## 2014-09-06 NOTE — Telephone Encounter (Signed)
Patient needs prescription for cough called Benzon[facility]ate [facility]

## 2014-09-06 NOTE — Addendum Note (Signed)
Addended by: Margaretha SheffieldBROWN, AUTUMN S on: 09/06/2014 04:14 PM   Modules accepted: Orders

## 2014-09-12 ENCOUNTER — Other Ambulatory Visit: Payer: Self-pay | Admitting: Family Medicine

## 2014-09-21 ENCOUNTER — Ambulatory Visit (INDEPENDENT_AMBULATORY_CARE_PROVIDER_SITE_OTHER): Payer: BC Managed Care – PPO | Admitting: Nurse Practitioner

## 2014-09-21 ENCOUNTER — Encounter: Payer: Self-pay | Admitting: Nurse Practitioner

## 2014-09-21 VITALS — BP 130/80 | Temp 98.6°F | Ht 70.0 in | Wt 279.2 lb

## 2014-09-21 DIAGNOSIS — J441 Chronic obstructive pulmonary disease with (acute) exacerbation: Secondary | ICD-10-CM

## 2014-09-21 DIAGNOSIS — J069 Acute upper respiratory infection, unspecified: Secondary | ICD-10-CM

## 2014-09-21 MED ORDER — OXYCODONE-ACETAMINOPHEN 7.5-325 MG PO TABS: 1.0000 | ORAL_TABLET | ORAL | Status: DC | PRN

## 2014-09-21 MED ORDER — PREDNISONE 20 MG PO TABS: ORAL_TABLET | ORAL | Status: DC

## 2014-09-21 MED ORDER — LEVOFLOXACIN 500 MG PO TABS: 500.0000 mg | ORAL_TABLET | Freq: Every day | ORAL | Status: DC

## 2014-09-21 MED ORDER — BENZONATATE 100 MG PO CAPS: ORAL_CAPSULE | ORAL | Status: DC

## 2014-09-26 ENCOUNTER — Encounter: Payer: Self-pay | Admitting: Nurse Practitioner

## 2014-09-26 NOTE — Progress Notes (Signed)
Subjective:  Presents for c/o cough and congestion over the past 5 days. Frequent cough; has pulled muscles in chest from coughing. Post tussive vomiting. No abd pain or reflux. Slight ethmoid area headache. Runny nose. Sore throat. No ear pain. Using albuterol 6 x per day for wheezing. Has COPD. Last neb 2 1/2 hours ago. Also because of insurance changes, [employer]ightDulera has become too expensive. Has taken Advair in the past.   Objective:   BP 130/80  Temp(Src) 98.6 F (37 C) (Oral)  Ht 5\' 10"  (1.778 m)  Wt 279 lb 4 oz (126.667 kg)  BMI 40.07 kg/m2 NAD. Alert, oriented. TMs clear effusion, no erythema. Pharynx injected with PND noted. Neck supple with mild anterior adenopathy. Lungs BS mildly diminished but clear. Mild SOB with talking. Frequent cough noted. Heart RRR.  Assessment:  Problem List Items Addressed This Visit     Respiratory   COPD exacerbation - Primary   Relevant Medications      predniSONE (DELTASONE) tablet      benzonatate (TESSALON) capsule    Other Visit Diagnoses   Acute upper respiratory infection          Plan:  Meds ordered this encounter  Medications  . oxyCODONE-acetaminophen (PERCOCET) 7.5-325 MG per tablet    Sig: Take 1 tablet by mouth every 4 (four) hours as needed for pain.    Dispense:  30 tablet    Refill:  0    Order Specific Question:  Supervising Provider    Answer:  Merlyn AlbertLUKING, WILLIAM S [[id number]]  . levofloxacin (LEVAQUIN) 500 MG tablet    Sig: Take 1 tablet (500 mg total) by mouth daily.    Dispense:  10 tablet    Refill:  0    Order Specific Question:  Supervising Provider    Answer:  Merlyn AlbertLUKING, WILLIAM S [[id number]]  . predniSONE (DELTASONE) 20 MG tablet    Sig: 3 po qd x 3 d then 2 po qd x 3 d then 1 po qd x 3 d    Dispense:  18 tablet    Refill:  0    Order Specific Question:  Supervising Provider    Answer:  Merlyn AlbertLUKING, WILLIAM S [[id number]]  . benzonatate (TESSALON) 100 MG capsule    Sig: 1-2 po TID prn cough    Dispense:  90 capsule    Refill:  0   Order Specific Question:  Supervising Provider    Answer:  Merlyn AlbertLUKING, WILLIAM S [[id number]]   Onetime increase in oxycodone dose then resume previous dose. This is for increased pain due to cough. Also cautioned that steroids will cause a temporary elevation in BS. Call back in 3-4 days if no improvement, sooner if worse. Also, patient to check to see which daily inhaler is preferred on his insurance and call us back.

## 2014-09-27 ENCOUNTER — Telehealth: Payer: Self-pay | Admitting: Family Medicine

## 2014-09-27 ENCOUNTER — Other Ambulatory Visit: Payer: Self-pay | Admitting: Nurse Practitioner

## 2014-09-27 MED ORDER — FLUTICASONE-SALMETEROL 500-50 MCG/DOSE IN AEPB: INHALATION_SPRAY | RESPIRATORY_TRACT | Status: DC

## 2014-09-27 NOTE — Telephone Encounter (Signed)
Printed Rx for Advair for mail in. Also attached is Rx for [facility]. Left at nurse's desk.

## 2014-09-27 NOTE — Progress Notes (Signed)
Has taken Advair without difficulty. Insurance no longer covers [employer] [facility]ulera. Also given Rx for [facility] magic mouthwash.

## 2014-09-27 NOTE — Telephone Encounter (Signed)
Patient was seen 10/21 by Eber Jonesarolyn and discussed a prescription for Advair for mailorder he would like to get one and also needing aprescrition for [facility] mouthwash [facility].

## 2014-09-28 NOTE — Telephone Encounter (Signed)
Pt states he wants [facility]. meds faxed. Pt notified.

## 2014-09-30 ENCOUNTER — Other Ambulatory Visit: Payer: Self-pay | Admitting: *Deleted

## 2014-09-30 ENCOUNTER — Telehealth: Payer: Self-pay | Admitting: Family Medicine

## 2014-09-30 ENCOUNTER — Ambulatory Visit: Payer: BC Managed Care – PPO | Admitting: Family Medicine

## 2014-09-30 MED ORDER — OXYCODONE-ACETAMINOPHEN 5-325 MG PO TABS: 1.0000 | ORAL_TABLET | Freq: Three times a day (TID) | ORAL | Status: DC | PRN

## 2014-09-30 NOTE — Telephone Encounter (Signed)
Patient requesting Rx for percocet. He said he is completely out.

## 2014-09-30 NOTE — Telephone Encounter (Signed)
Pt states he is completely out because of the thrush he has. States he cannot sleep and his mouth feels like it is on fire. He is using [facility] magic mouthwash. Consult with Dr. Lorin PicketScott. percocoet 5/325 # 12 one tid. This is a four day supply to get through til next week.

## 2014-09-30 NOTE — Telephone Encounter (Signed)
Script upfront for 4 day supply only. Needs office visit on Tuesday per Dr. Lorin PicketScott. Pt notified.

## 2014-09-30 NOTE — Telephone Encounter (Signed)
Discussed with patient that DR. Brett CanalesSteve will be back on Monday. He filled 5/325 on 9/30 for #90.  He got a one time increase 7.5/325 #30 on 10/21. Explained to pt that he should have enough til tues per Dr. Lorin PicketScott and we will send message to Dr. Brett CanalesSteve to see on Monday.

## 2014-10-04 ENCOUNTER — Encounter: Payer: Self-pay | Admitting: Family Medicine

## 2014-10-04 ENCOUNTER — Ambulatory Visit (INDEPENDENT_AMBULATORY_CARE_PROVIDER_SITE_OTHER): Payer: BC Managed Care – PPO | Admitting: Family Medicine

## 2014-10-04 VITALS — BP 112/88 | Ht 70.0 in | Wt 279.0 lb

## 2014-10-04 DIAGNOSIS — G894 Chronic pain syndrome: Secondary | ICD-10-CM

## 2014-10-04 DIAGNOSIS — E119 Type 2 diabetes mellitus without complications: Secondary | ICD-10-CM

## 2014-10-04 MED ORDER — ALPRAZOLAM 1 MG PO TABS: ORAL_TABLET | ORAL | Status: DC

## 2014-10-04 MED ORDER — OXYCODONE-ACETAMINOPHEN 5-325 MG PO TABS: 1.0000 | ORAL_TABLET | Freq: Three times a day (TID) | ORAL | Status: DC | PRN

## 2014-10-04 NOTE — Progress Notes (Signed)
   Subjective:    Patient ID: Joseph [name], male    DOB: [birth year], 43 y.o.   MRN: [id number]  HPI Patient is here today for chronic pain med visit.  Chronic pain in right shoulder and in left leg.  Needed additional pain med the pt states for chronic pain  He needs a refill on his oxycodone.  He takes it for his shoulder pain, and has been taking it for his cough that he has had for the past 2 months. Patient states that he definitely needs his pain medication to maintain his usual function. Claims no daytime drowsiness.  Pt was on the prednisone for chronic cough and wheezing Amazingly the patient states he is actually seen 3 different pulmonary specialist. 4 different aspects of his pulmonary condition.  Patient claims his sugars have been running good lately.   Review of Systems No headache no chest pain no back pain abdominal pain no change in bowel habits    Objective:   Physical Exam   no blood in stool alert no apparent distress. HEENT normal. Lungs clear. Heart regular in rhythm. Ankles without edema.right shoulder significant pain with rotation left knee significant pain with extension. Ankles without edema.      Assessment & Plan:  Impression chronic pain ongoing. With need for medication. #2 type 2 diabetes patient claims numbers running good #3 COPD with ongoing smoking discussed plan 25 minutes spent most discussion. Pain medication refilled. Recheck in several months. Will need A1c and further evaluation then. WSL

## 2014-10-25 ENCOUNTER — Telehealth: Payer: Self-pay | Admitting: Internal Medicine

## 2014-10-25 MED ORDER — MOMETASONE FURO-FORMOTEROL FUM 200-5 MCG/ACT IN AERO: 2.0000 | INHALATION_SPRAY | Freq: Two times a day (BID) | RESPIRATORY_TRACT | Status: DC

## 2014-10-25 NOTE — Telephone Encounter (Signed)
i have called and lmomtcb x 1 for the pt.  Sample of the dulera has been left up front for the pt to come and pick up.

## 2014-10-25 NOTE — Telephone Encounter (Signed)
Spoke with pt. Advised dulera sample at front for pick up. Discussed pt assistance with pt.  He would like to apply for this. [employer] Assistance Application Printed and placed with sample. Pt aware and understands to bring completed application back to office with any required documents and we would send to company.   He verbalized understanding, was very appreciative of this, and voiced no further questions or concerns at this time.

## 2014-10-25 NOTE — Telephone Encounter (Signed)
Pt returning call let him know that samples were ready for pick-up.Caren GriffinsStanley A Dalton

## 2014-10-26 ENCOUNTER — Other Ambulatory Visit: Payer: Self-pay | Admitting: Family Medicine

## 2014-11-14 ENCOUNTER — Emergency Department (HOSPITAL_COMMUNITY): Payer: BC Managed Care – PPO

## 2014-11-14 ENCOUNTER — Encounter (HOSPITAL_COMMUNITY): Payer: Self-pay | Admitting: *Deleted

## 2014-11-14 ENCOUNTER — Emergency Department (HOSPITAL_COMMUNITY)
Admission: EM | Admit: 2014-11-14 | Discharge: 2014-11-14 | Disposition: A | Discharge status: Home / Self Care | Payer: BC Managed Care – PPO | Attending: Emergency Medicine | Admitting: Emergency Medicine

## 2014-11-14 DIAGNOSIS — G8929 Other chronic pain: Secondary | ICD-10-CM | POA: Diagnosis not present

## 2014-11-14 DIAGNOSIS — R05 Cough: Secondary | ICD-10-CM

## 2014-11-14 DIAGNOSIS — E119 Type 2 diabetes mellitus without complications: Secondary | ICD-10-CM | POA: Insufficient documentation

## 2014-11-14 DIAGNOSIS — Z7951 Long term (current) use of inhaled steroids: Secondary | ICD-10-CM | POA: Diagnosis not present

## 2014-11-14 DIAGNOSIS — Z79899 Other long term (current) drug therapy: Secondary | ICD-10-CM | POA: Diagnosis not present

## 2014-11-14 DIAGNOSIS — J449 Chronic obstructive pulmonary disease, unspecified: Secondary | ICD-10-CM

## 2014-11-14 DIAGNOSIS — Z7952 Long term (current) use of systemic steroids: Secondary | ICD-10-CM | POA: Diagnosis not present

## 2014-11-14 DIAGNOSIS — R059 Cough, unspecified: Secondary | ICD-10-CM

## 2014-11-14 DIAGNOSIS — M1990 Unspecified osteoarthritis, unspecified site: Secondary | ICD-10-CM | POA: Insufficient documentation

## 2014-11-14 DIAGNOSIS — Z720 Tobacco use: Secondary | ICD-10-CM | POA: Insufficient documentation

## 2014-11-14 DIAGNOSIS — J441 Chronic obstructive pulmonary disease with (acute) exacerbation: Secondary | ICD-10-CM | POA: Insufficient documentation

## 2014-11-14 DIAGNOSIS — G4730 Sleep apnea, unspecified: Secondary | ICD-10-CM | POA: Diagnosis not present

## 2014-11-14 DIAGNOSIS — K219 Gastro-esophageal reflux disease without esophagitis: Secondary | ICD-10-CM | POA: Diagnosis not present

## 2014-11-14 MED ORDER — PREDNISONE 50 MG PO TABS
60.0000 mg | ORAL_TABLET | Freq: Once | ORAL | Status: AC
  Administered 2014-11-14: 60 mg via ORAL
  Filled 2014-11-14 (×2): qty 1

## 2014-11-14 MED ORDER — PREDNISONE 50 MG PO TABS: ORAL_TABLET | ORAL | Status: DC

## 2014-11-14 NOTE — ED Provider Notes (Signed)
CSN: [id number]     Arrival date & time 11/14/14  1907 History   First MD Initiated Contact with Patient 11/14/14 2119     Chief Complaint  Patient presents with  . Cough     Patient is a 43 y.o. male presenting with cough. The history is provided by the patient.  Cough Cough characteristics:  Non-productive Severity:  Moderate Onset quality:  Gradual Duration:  1 week Timing:  Intermittent Progression:  Worsening Chronicity:  Recurrent Smoker: yes   Relieved by:  Nothing Worsened by:  Nothing tried Associated symptoms: myalgias, shortness of breath and sore throat   Associated symptoms: no fever   Associated symptoms comment:  Chest pain with cough  Patient presents with increased nonproductive cough for past week.  He reports it is not significantly improved with home albuterol.  He has chest pain with cough No hemoptysis No syncope No new LE edema   Past Medical History  Diagnosis Date  . Diabetes mellitus   . COPD (chronic obstructive pulmonary disease)   . Sleep apnea   . Asthma   . Chronic pain of left knee   . Chronic back pain   . Shortness of breath   . GERD (gastroesophageal reflux disease)   . Arthritis    Past Surgical History  Procedure Laterality Date  . Carpal tunnel release Left   . Shoulder arthrocentesis Right   . Vasectomy    . Hernia repair Bilateral   . Knee arthroscopy with medial menisectomy Left 04/22/2014    Procedure: LEFT KNEE ARTHROSCOPY WITH MEDIAL MENISECTOMY;  Surgeon: Vickki HearingStanley E Harrison, MD;  Location: [facility];  Service: Orthopedics;  Laterality: Left;   Family History  Problem Relation Age of Onset  . Emphysema Maternal Grandfather   . Emphysema Maternal Grandmother   . Clotting disorder Maternal Grandmother    History  Substance Use Topics  . Smoking status: Current Every Day Smoker -- 1.50 packs/day for 20 years    Types: Cigarettes    Start date: 12/17/1993  . Smokeless tobacco: Current User     Comment: using vap  .  Alcohol Use: No    Review of Systems  Constitutional: Positive for fatigue. Negative for fever.  HENT: Positive for sore throat.   Respiratory: Positive for cough and shortness of breath.   Cardiovascular: Negative for leg swelling.  Gastrointestinal: Negative for vomiting.  Musculoskeletal: Positive for myalgias.  All other systems reviewed and are negative.     Allergies  Azithromycin; Erythromycin; Keflex; Metformin; Symbicort; and Doxycycline  Home Medications   Prior to Admission medications   Medication Sig Start Date End Date Taking? Authorizing Provider  albuterol (PROAIR HFA) 108 (90 BASE) MCG/ACT inhaler Inhale 2 puffs into the lungs every 4 (four) hours as needed for wheezing or shortness of breath (((PLAN A))).     Historical Provider, MD  albuterol (PROVENTIL) (2.5 MG/3ML) 0.083% nebulizer solution USE ONE VIAL IN NEBULIZER FOUR TIMES DAILY 09/12/14   Merlyn AlbertWilliam S Luking, MD  ALPRAZolam [name](XANAX) 1 MG tablet TAKE ONE TABLET BY MOUTH TWICE A DAY AS NEEDED FOR ANXIETY 10/04/14   Merlyn AlbertWilliam S Luking, MD  aspirin 325 MG EC tablet Take 325 mg by mouth 3 (three) times daily as needed for pain.     Historical Provider, MD  benzonatate (TESSALON) 100 MG capsule 1-2 po TID prn cough 09/21/14   Campbell Richesarolyn C Hoskins, NP  cetirizine (ZYRTEC) 10 MG tablet Take 10 mg by mouth daily.    Historical Provider, MD  citalopram (CELEXA) 40 MG tablet TAKE ONE (1) TABLET EACH DAY 10/26/14   Merlyn Albert, MD  Fluticasone-Salmeterol (ADVAIR DISKUS) 500-50 MCG/DOSE AEPB One puff BID 09/27/14   Campbell Riches, NP  glyBURIDE (DIABETA) 5 MG tablet TAKE ONE TABLET BY MOUTH TWICE A DAY AFTER MEALS 09/05/14   Merlyn Albert, MD  ibuprofen (ADVIL,MOTRIN) 200 MG tablet Take 400 mg by mouth 3 (three) times daily as needed for mild pain.    Historical Provider, MD  ipratropium (ATROVENT) 0.02 % nebulizer solution Take 0.5 mg by nebulization 4 (four) times daily as needed for wheezing or shortness of breath.     Historical Provider, MD  ipratropium (ATROVENT) 0.02 % nebulizer solution USE ONE VIAL IN NEBULIZER FOUR TIMES A DAY 09/12/14   Merlyn Albert, MD  lansoprazole (PREVACID) 30 MG capsule Take 1 capsule (30 mg total) by mouth daily at 12 noon. 02/23/14   Merlyn Albert, MD  mometasone-formoterol [facility]) 200-5 MCG/ACT AERO Inhale 2 puffs into the lungs 2 (two) times daily. 10/25/14   Nyoka Cowden, MD  Multiple Vitamin (MULTIVITAMIN WITH MINERALS) TABS Take 1 tablet by mouth every morning. Men's once daily multivitamin packet (vitamin e, calcium, ginseng)    Historical Provider, MD  oxyCODONE-acetaminophen (ROXICET) 5-325 MG per tablet Take 1 tablet by mouth every 8 (eight) hours as needed for severe pain. 10/04/14   Merlyn Albert, MD  predniSONE (DELTASONE) 50 MG tablet One tablet PO daily for 4 days 11/14/14   Joya Gaskins, MD  promethazine (PHENERGAN) 12.5 MG tablet Take 1 tablet (12.5 mg total) by mouth every 6 (six) hours as needed for nausea or vomiting. 04/22/14   Vickki Hearing, MD  Respiratory Therapy Supplies (FLUTTER) DEVI Use as directed 12/17/13   Nyoka Cowden, MD  Testosterone (ANDROGEL PUMP) 20.25 MG/ACT (1.62%) GEL 2 pumps daily 07/22/14   Merlyn Albert, MD   BP 123/87 mmHg  Pulse 90  Temp(Src) 99 F (37.2 C) (Oral)  Resp 18  Ht 5\' 10"  (1.778 m)  Wt 279 lb (126.554 kg)  BMI 40.03 kg/m2  SpO2 97% Physical Exam CONSTITUTIONAL: Well developed/well nourished HEAD: Normocephalic/atraumatic EYES: EOMI/PERRL ENMT: Mucous membranes moist NECK: supple no meningeal signs SPINE/BACK:entire spine nontender CV: S1/S2 noted, no murmurs/rubs/gallops noted LUNGS: Lungs are clear to auscultation bilaterally, no apparent distress ABDOMEN: soft, nontender, no rebound or guarding, bowel sounds noted throughout abdomen NEURO: Pt is awake/alert/appropriate, moves all extremitiesx4.  No facial droop.   EXTREMITIES: pulses normal/equal, full ROM, no LE edema noted SKIN: warm,  color normal PSYCH: no abnormalities of mood noted, alert and oriented to situation  ED Course  Procedures  Imaging Review Dg Chest 2 View  11/14/2014   CLINICAL DATA:  Cough and short of breath  EXAM: CHEST  2 VIEW  COMPARISON:  12/09/2013  FINDINGS: Normal heart size. Nodular density at the right lung base is stable compatible with a nipple shadow. Clear lungs. No pneumothorax or pleural effusion.  IMPRESSION: No active cardiopulmonary disease.   Electronically Signed   By: Maryclare Bean M.D.   On: 11/14/2014 20:05   Pt well appearing He has frequent cough episodes while I am in the room, but otherwise he is in no distress No hypoxia noted He is ambulatory without difficulty He requests prednisone as he feels this is related to his COPD He has not been on prednisone for several months He was advised to check his glucose frequently as he has propensity to  elevate his glucose while on prednisone Suspect mild COPD exacerbation  MDM   Final diagnoses:  Chronic obstructive pulmonary disease, unspecified COPD, unspecified chronic bronchitis type    Nursing notes including past medical history and social history reviewed and considered in documentation xrays/imaging reviewed by myself and considered during evaluation     Joya Gaskinsonald W Natalin Bible, MD 11/14/14 2140

## 2014-11-14 NOTE — ED Notes (Signed)
Cough, sob for 1 week,  No fever known,

## 2014-11-14 NOTE — ED Notes (Signed)
Patient given discharge instruction, verbalized understand. Patient ambulatory out of the department.  

## 2014-11-22 ENCOUNTER — Telehealth: Payer: Self-pay | Admitting: Family Medicine

## 2014-11-22 ENCOUNTER — Other Ambulatory Visit: Payer: Self-pay | Admitting: Nurse Practitioner

## 2014-11-22 MED ORDER — CLOTRIMAZOLE 10 MG MT TROC: 10.0000 mg | Freq: Every day | OROMUCOSAL | Status: DC

## 2014-11-22 NOTE — Telephone Encounter (Signed)
Patient has thrush and would like something called in.  [facility]

## 2014-11-23 ENCOUNTER — Telehealth: Payer: Self-pay

## 2014-11-23 NOTE — Telephone Encounter (Signed)
Pt needs handicapped placard signed attached to chart. Please Attn to paperwork attached for disability.

## 2014-11-28 ENCOUNTER — Telehealth: Payer: Self-pay | Admitting: Internal Medicine

## 2014-11-28 NOTE — Telephone Encounter (Signed)
lmtcb X1 for pt.  Samples placed up front for him.

## 2014-11-29 ENCOUNTER — Ambulatory Visit (INDEPENDENT_AMBULATORY_CARE_PROVIDER_SITE_OTHER): Payer: BC Managed Care – PPO | Admitting: Internal Medicine

## 2014-11-29 ENCOUNTER — Encounter: Payer: Self-pay | Admitting: Internal Medicine

## 2014-11-29 VITALS — BP 112/80 | HR 89 | Ht 70.0 in | Wt 289.0 lb

## 2014-11-29 DIAGNOSIS — J45909 Unspecified asthma, uncomplicated: Secondary | ICD-10-CM

## 2014-11-29 DIAGNOSIS — Z720 Tobacco use: Secondary | ICD-10-CM

## 2014-11-29 DIAGNOSIS — F17200 Nicotine dependence, unspecified, uncomplicated: Secondary | ICD-10-CM

## 2014-11-29 NOTE — Patient Instructions (Addendum)
Think of your respiratory medications as multiple steps you can take to control your symptoms and avoid having to go to the ER   Plan A is your maintenance daily no matter what meds: dulera Take 2 puffs first thing in am and then another 2 puffs about 12 hours later.  Plan B only use after you've used your maintenance (Plan A) medication, and only if you can't catch your breath: Proair 2 every 4 hours as needed  Plan C only use after you've used plan A and B and still can't catch your breath: nebulizer albuterol 2.5 mg up to every 4 hours  Plan D(for Doctor):  If you've used A thru C and not doing a lot better or still needing C more than a once a day,  D = call the doctor for evaluation asap Plan E (for ER):  If still not able to catch your breath, even after using your nebulizer up to every 4 hours, go to ER   For cough > delsym 2 tsp every 12 hours is the best you can buy  Prevacid 30 mg Take 30-60 min before first meal of the day   GERD (REFLUX)  is an extremely common cause of respiratory symptoms just like yours , many times with no obvious heartburn at all.    It can be treated with medication, but also with lifestyle changes including avoidance of late meals, excessive alcohol, smoking cessation, and avoid fatty foods, chocolate, peppermint, colas, red wine, and acidic juices such as orange juice.  NO MINT OR MENTHOL PRODUCTS SO NO COUGH DROPS  USE SUGARLESS CANDY INSTEAD (Jolley ranchers or Stover's or Life Savers) or even ice chips will also do - the key is to swallow to prevent all throat clearing. NO OIL BASED VITAMINS - use powdered substitutes.    Please schedule a follow up office visit in 4 weeks, sooner if needed with med calendar and inhalers with you for pfts on return

## 2014-11-29 NOTE — Telephone Encounter (Signed)
LMTCB x 1 

## 2014-11-29 NOTE — Progress Notes (Signed)
Subjective:   Patient ID: Joseph [name], male    DOB: June 28, 1971   MRN: [id number]    Brief patient profile:  [age] yow[age] active smoker without significant airflow obst by PFTs 06/02/12    History of Present Illness  IOV 04/10/12  43 year old male.  reports that he has been smoking Cigarettes.  He has a 40 pack-year smoking history; 2 ppd since age 43. He does not have any smokeless tobacco history on file. Body mass index is 39.97 kg/(m^2) (40# weight gain in 4 years) Joseph AsaLUKING,[name], MD, MD is pcp. At basline has dx of chronic percocet use, OSA on CPAP, Morbid obesity and COPD since early 2002 (has hx of spirometry back then but result NA). Also, hx of pyrethrin inhalation through work in [employer] 1992 - 1999. Currently unemployed. Has had dx of pna in past. Also reports strong hx of breathing issues in family.   In April 2013 reports he had pneumonia apparently. Went to [facility] and found some abnormality (nodule based on review of ER notes) on CXR that resulted in CT Angio 03/07/12 same visit. PE ruled out but found to have mediastinal nodes but the cxr nodule was not there. Apparently ER doctor said it was lymphoma and asked to see an oncologist. But primary care doctor felt that was over zealous and referred patient here. He prefers conservative mgmt to nodes if clinical suspicion for cancer is low  Currently, feels baseline which is lack of energy (spent most of days yesterday in bed), esp in pollen, chronic dyspnea on exertion for 180 feet relieved by rest  But slowly progressive since 2002 for which he uses albuterol prn on average 4 times a day.  Walkt test 185 feet x 3 laps: no desaturation  He smokes at baseline; trying quit, prior intolerance (dreams) with chantix.   Only baseline mild chronic cough with associtaed wheeze that is occasional.. Does have some B symptoms like nocturnal diaphoresis (soaks pillows). Denies weight loss.   CT ANGIOGRAPHY CHEST  Comparison: 03/07/2012  An  abnormal right lower paratracheal node has a short axis diameter  of 1.6 cm. An abnormal AP window lymph node has a short axis  diameter of 1.4 cm. Mild bilateral hilar and infrahilar adenopathy  noted. Scattered small axillary lymph nodes are present.  A subcarinal node has a short axis diameter of 1.5 cm.     OV 08/14/2012 Mediastinal nodes: PET scan 06/10/12 shows very low uptake in the mediastinal nodes. Suspicion for cancer is very low. Lymph nodes include 13 mm right lower paratracheal lymph node and 10 mm prevascular lymph node. ACE level 04/10/12  - is in 20s and normal  Still dyspneic: 1 flught of steps and better with rest and with albuterol and atrovent prn. Dyspne also worse in winter and cooler weathers. Rates it as moderate. STable since last visit to slightly worse. thre is some associtaed cough. No asociated weight loss. COugh is moderate and worst early in morning and in cooler temperatues.. Denies sinus drainage. PFts show mixed picture - fvc 3.4L/69%, fev1 2.9/76%, Ratio 85 but 12% bd respose. TLC 5.7/83%. DLCO 23.7/69% rec rec #MEdiastinal node  - suspicion for cancer is low  - you will need CT chest in a year; we will schedule it later  #Smoking  - glad you are working on quitting  #Shortness of breath - this is due to asthma/copd and weight  - please start QVAR 2 puff twice daily - take sample  and show technique and take prescription too  - focus on weight loss through the low glycemic diet; take diet sheet from [place]  #FOllowup  - 3 months with spirometry at followup  - flu shot and pneumovax through PMD as discussed   12/17/2013  acute ov/Wert still active smoker re: recurrent pattern of coughing x a decade intermittent / out of control since nov 2014/ can't take symbicort makes it worse / on percocet one tid at baseline  Chief Complaint  Patient presents with  . Acute Visit    MR pt.  C/o mostly nonprod cough with some thick white mucous, coughing until he almost  passes out.  Went to [employer] last week.    duoneb daily  Sometime skips the doses later in the day but always uses the am dose, sputum to purulent. Mostly sob with cough/ otherwise breaths ok Last neb was 4 h prior to OV   rec For Cough - use the flutter valve as much as you can plus mucinex dm 1200 mg every 12 hours as needed supplement with percocet up to 2 every 4hours For Breathing  Only use your duoneb  rescue medication . - Ok to use up to    every 4 hours if you must  Prednisone 10 mg take  4 each am x 2 days,   2 each am x 2 days,  1 each am x 2 days and stop  Pantoprazole (protonix) 40 mg   Take 30-60 min before first meal of the day and Pepcid 20 mg one bedtime until return to office - this is the best way to tell whether stomach acid is contributing to your problem.  GERD  Diet . Please schedule a follow up office visit in 2 weeks, sooner if needed with all meds in hand  07/27/2014 f/u ov/Wert re: asthma / still smoking  Chief Complaint  Patient presents with  . Follow-up    Pt c/o increased SOB and cough for the past month- cough is occ prod with minimal clear sputum. Pt currently taking clindamycin for staph infection. Rarely using albuterol inhaler, but is using neb approx 4 times per day.   off dulera x 1.5 week denies more sob/ cough or need for rescue on vs off rec Plan A=  dulera 200 Take 2 puffs first thing in am and then another 2 puffs about 12 hours later (or formulary alternative- call us if you find a cheaper one)  Plan B =  Backup = proaire  Plan C = Backup plan B = nebulized albuterol up to 4 hours but call if needing this much at all The key is to stop smoking completely before smoking completely stops you!    11/29/2014 f/u ov/Wert re: [name]/p aecopd still smoking  Chief Complaint  Patient presents with  . Follow-up    Pt states that his SOB and cough have been worse for the past several wks. He went to ED for COPD exacerbation on 11/14/14.  He is using proair  3-4 times per day and neb about 3 times per day on average.   one month prior to flare needing proair 1-2 per day and neb at least once or twice also - using neb first thing instead of dulera  Better p pred from er then worse again w/in a week of taper off assoc with congested cough but mostly white mucus esp in ams  No obvious day to day or daytime variabilty or assoc cp or chest  tightness, subjective wheeze overt sinus or hb symptoms. No unusual exp hx or h/o childhood pna/ asthma or knowledge of premature birth.  Sleeping ok without nocturnal  or early am exacerbation  of respiratory  c/o'[name] or need for noct saba. Also denies any obvious fluctuation of symptoms with weather or environmental changes or other aggravating or alleviating factors except as outlined above   Current Medications, Allergies, Complete Past Medical History, Past Surgical History, Family History, and Social History were reviewed in [facility] electronic medical record.  ROS  The following are not active complaints unless bolded sore throat, dysphagia, dental problems, itching, sneezing,  nasal congestion or excess/ purulent secretions, ear ache,   fever, chills, sweats, unintended wt loss, pleuritic or exertional cp, hemoptysis,  orthopnea pnd or leg swelling, presyncope, palpitations, heartburn, abdominal pain, anorexia, nausea, vomiting, diarrhea  or change in bowel or urinary habits, change in stools or urine, dysuria,hematuria,  rash, arthralgias, visual complaints, headache, numbness weakness or ataxia or problems with walking or coordination,  change in mood/affect or memory.              Objective:   Physical Exam  amb obese bm nad    12/31/2013       294  > 294 01/14/2014 > 02/28/2014  292 >  07/27/2014  286 > 11/29/2014   289     HEENT: nl dentition, turbinates, and orophanx. Nl external ear canals without cough reflex   NECK :  without JVD/Nodes/TM/ nl carotid upstrokes bilaterally   LUNGS: no acc  muscle use, clear to A and P bilaterally with mid exp rhonchi bilaterally    CV:  RRR  no s3 or murmur or increase in P2, no edema   ABD:  soft and nontender with nl excursion in the supine position. No bruits or organomegaly, bowel sounds nl  MS:  warm without deformities, calf tenderness, cyanosis or clubbing  SKIN: warm and dry without lesions    NEURO:  alert, approp, no deficits          11/14/14 No active cardiopulmonary disease.            Assessment & Plan:

## 2014-11-29 NOTE — Telephone Encounter (Signed)
Pt returned call He was actually seen today > picked up his Dulera sample at ov Nothing further needed; will sign off

## 2014-12-01 ENCOUNTER — Encounter: Payer: Self-pay | Admitting: Family Medicine

## 2014-12-01 ENCOUNTER — Ambulatory Visit (INDEPENDENT_AMBULATORY_CARE_PROVIDER_SITE_OTHER): Payer: BC Managed Care – PPO | Admitting: Family Medicine

## 2014-12-01 VITALS — BP 130/82 | Ht 70.0 in | Wt 287.0 lb

## 2014-12-01 DIAGNOSIS — G894 Chronic pain syndrome: Secondary | ICD-10-CM

## 2014-12-01 DIAGNOSIS — Z23 Encounter for immunization: Secondary | ICD-10-CM

## 2014-12-01 DIAGNOSIS — E119 Type 2 diabetes mellitus without complications: Secondary | ICD-10-CM

## 2014-12-01 LAB — POCT GLYCOSYLATED HEMOGLOBIN (HGB A1C): Hemoglobin A1C: 9.4

## 2014-12-01 MED ORDER — OXYCODONE-ACETAMINOPHEN 5-325 MG PO TABS
1.0000 | ORAL_TABLET | Freq: Three times a day (TID) | ORAL | Status: DC | PRN
Start: 2014-12-01 — End: 2014-12-01

## 2014-12-01 MED ORDER — OXYCODONE-ACETAMINOPHEN 5-325 MG PO TABS: 1.0000 | ORAL_TABLET | Freq: Three times a day (TID) | ORAL | Status: DC | PRN

## 2014-12-01 MED ORDER — GLYBURIDE 5 MG PO TABS: ORAL_TABLET | ORAL | Status: DC

## 2014-12-01 MED ORDER — OXYCODONE-ACETAMINOPHEN 5-325 MG PO TABS: 1.0000 | ORAL_TABLET | Freq: Four times a day (QID) | ORAL | Status: DC | PRN

## 2014-12-01 MED ORDER — LANSOPRAZOLE 30 MG PO CPDR: 30.0000 mg | DELAYED_RELEASE_CAPSULE | Freq: Two times a day (BID) | ORAL | Status: DC

## 2014-12-01 NOTE — Patient Instructions (Signed)
As part of your visit today we have covered your chronic pain. You have been given prescription(s) for pain medicines.The DEA and [facility] Board require that any patient on pain medications must be seen every 3 months. You are expected to come in for a office visit before further pain medications are issued.   We will not refill medications or early nor will we give an extended month supply at the end of these prescriptions.It is your responsibility to keep up with medications. They will not be replaced.  It is your responsibility to schedule an office visit in 3-4 months to be seen before you are out of your medication. Do not call our office to request early refills or additional refills. Do not wait till the last moment to schedule the follow up visit. We highly recommend you schedule this now for 3 months.  We believe that most patients take their meds as prescribed but drug misuse and diversion is a serious problem in the [place]. Our office does standard measures to insure proper care to all. All patients are subject to random urine drug screens and random pill counts. Also all patients drug prescription records are reviewed on a regular basis in accordance with [facility] medical board policies.  Remember, do not use alcohol or illegal drugs with your pain medications.    We are required by law to adhere to strict regulations. Failure on our part to follow these regulations could jeopardize our prescription license which in turn would cause [place] not to be able to care for you.Thank you for your understanding and following these policies.  Smoking Cessation Quitting smoking is important to your health and has many advantages. However, it is not always easy to quit since nicotine is a very addictive drug. Oftentimes, people try 3 times or more before being able to quit. This document explains the best ways for you to prepare to quit smoking. Quitting takes hard work and a lot of effort, but you can do  it. ADVANTAGES OF QUITTING SMOKING  You will live longer, feel better, and live better.  Your body will feel the impact of quitting smoking almost immediately.  Within 20 minutes, blood pressure decreases. Your pulse returns to its normal level.  After 8 hours, carbon monoxide levels in the blood return to normal. Your oxygen level increases.  After 24 hours, the chance of having a heart attack starts to decrease. Your breath, hair, and body stop smelling like smoke.  After 48 hours, damaged nerve endings begin to recover. Your sense of taste and smell improve.  After 72 hours, the body is virtually free of nicotine. Your bronchial tubes relax and breathing becomes easier.  After 2 to 12 weeks, lungs can hold more air. Exercise becomes easier and circulation improves.  The risk of having a heart attack, stroke, cancer, or lung disease is greatly reduced.  After 1 year, the risk of coronary heart disease is cut in half.  After 5 years, the risk of stroke falls to the same as a nonsmoker.  After 10 years, the risk of lung cancer is cut in half and the risk of other cancers decreases significantly.  After 15 years, the risk of coronary heart disease drops, usually to the level of a nonsmoker.  If you are pregnant, quitting smoking will improve your chances of having a healthy baby.  The people you live with, especially any children, will be healthier.  You will have extra money to spend on things other  than cigarettes. QUESTIONS TO THINK ABOUT BEFORE ATTEMPTING TO QUIT You may want to talk about your answers with your health care provider.  Why do you want to quit?  If you tried to quit in the past, what helped and what did not?  What will be the most difficult situations for you after you quit? How will you plan to handle them?  Who can help you through the tough times? Your family? Friends? A health care provider?  What pleasures do you get from smoking? What ways can you  still get pleasure if you quit? Here are some questions to ask your health care provider:  How can you help me to be successful at quitting?  What medicine do you think would be best for me and how should I take it?  What should I do if I need more help?  What is smoking withdrawal like? How can I get information on withdrawal? GET READY  Set a quit date.  Change your environment by getting rid of all cigarettes, ashtrays, matches, and lighters in your home, car, or work. Do not let people smoke in your home.  Review your past attempts to quit. Think about what worked and what did not. GET SUPPORT AND ENCOURAGEMENT You have a better chance of being successful if you have help. You can get support in many ways.  Tell your family, friends, and coworkers that you are going to quit and need their support. Ask them not to smoke around you.  Get individual, group, or telephone counseling and support. Programs are available at [facility] and health centers. Call your local health department for information about programs in your area.  Spiritual beliefs and practices may help some smokers quit.  Download a "quit meter" on your computer to keep track of quit statistics, such as how long you have gone without smoking, cigarettes not smoked, and money saved.  Get a self-help book about quitting smoking and staying off tobacco. LEARN NEW SKILLS AND BEHAVIORS  Distract yourself from urges to smoke. Talk to someone, go for a walk, or occupy your time with a task.  Change your normal routine. Take a different route to work. Drink tea instead of coffee. Eat breakfast in a different place.  Reduce your stress. Take a hot bath, exercise, or read a book.  Plan something enjoyable to do every day. Reward yourself for not smoking.  Explore interactive web-based programs that specialize in helping you quit. GET MEDICINE AND USE IT CORRECTLY Medicines can help you stop smoking and decrease  the urge to smoke. Combining medicine with the above behavioral methods and support can greatly increase your chances of successfully quitting smoking.  Nicotine replacement therapy helps deliver nicotine to your body without the negative effects and risks of smoking. Nicotine replacement therapy includes nicotine gum, lozenges, inhalers, nasal sprays, and skin patches. Some may be available over-the-counter and others require a prescription.  Antidepressant medicine helps people abstain from smoking, but how this works is unknown. This medicine is available by prescription.  Nicotinic receptor partial agonist medicine simulates the effect of nicotine in your brain. This medicine is available by prescription. Ask your health care provider for advice about which medicines to use and how to use them based on your health history. Your health care provider will tell you what side effects to look out for if you choose to be on a medicine or therapy. Carefully read the information on the package. Do not use any other  product containing nicotine while using a nicotine replacement product.  RELAPSE OR DIFFICULT SITUATIONS Most relapses occur within the first 3 months after quitting. Do not be discouraged if you start smoking again. Remember, most people try several times before finally quitting. You may have symptoms of withdrawal because your body is used to nicotine. You may crave cigarettes, be irritable, feel very hungry, cough often, get headaches, or have difficulty concentrating. The withdrawal symptoms are only temporary. They are strongest when you first quit, but they will go away within 10-14 days. To reduce the chances of relapse, try to:  Avoid drinking alcohol. Drinking lowers your chances of successfully quitting.  Reduce the amount of caffeine you consume. Once you quit smoking, the amount of caffeine in your body increases and can give you symptoms, such as a rapid heartbeat, sweating, and  anxiety.  Avoid smokers because they can make you want to smoke.  Do not let weight gain distract you. Many smokers will gain weight when they quit, usually less than 10 pounds. Eat a healthy diet and stay active. You can always lose the weight gained after you quit.  Find ways to improve your mood other than smoking. FOR MORE INFORMATION  www.smokefree.gov  Document Released: 11/12/2001 Document Revised: 04/04/2014 Document Reviewed: 02/27/2012 [facility] Patient Information 2015 [place] [place]. This information is not intended to replace advice given to you by your health care provider. Make sure you discuss any questions you have with your health care provider.

## 2014-12-01 NOTE — Progress Notes (Signed)
   Subjective:    Patient ID: Joseph [name], male    DOB: Oct 24, 1971, 43 y.o.   MRN: [id number]  Diabetes He presents for his follow-up diabetic visit. He has type 2 diabetes mellitus. There are no hypoglycemic associated symptoms. Associated symptoms include fatigue. Current diabetic treatment includes oral agent (monotherapy). He rarely participates in exercise. He monitors blood glucose at home 1-2 x per week. His highest blood glucose is >200 mg/dl. His overall blood glucose range is 140-180 mg/dl. He does not see a podiatrist.Eye exam is current.   Has been on 2 rounds of prednisone   On prevacid for heartburn and reflux. Patient notes that one Prevacid per day he is not managing his reflux.  Left knee pain and back pain chronic in nature,  Pt states only three tabs of oxy per day is covering the pain, States he definietely needs four    States depression and stress overall is stable.   Using the cpap regulalry and sill helps breathing and sleep Review of Systems  Constitutional: Positive for fatigue.   Chronic low back pain. No chest pain no substantial headache.    Objective:   Physical Exam  Alert no acute distress HEENT normal vital stable blood pressure good. Lungs clear. Heart rare rhythm. Low back tender to palpation.      Assessment & Plan:  Impression 1 chronic degenerative disc disease and chronic back pain. Disabling. Patient now has been judged as this. Patient states the pain medicine wears off. And he definitely needs 4 per day #2 progressive COPD with ongoing smoking #3 reflux some standard discussed. #4 diabetes suboptimum control discussed plan medications for diabetes adjusted. Plan as noted above. Increase oxycodone to 4 times a day. Diet exercise discussed. Smoking cessation discussed. 25 minutes spent most in discussion. Check every 3 months. WSL

## 2014-12-03 NOTE — Assessment & Plan Note (Signed)
-   PFT's s sign copd 06/2012 - med calendar 2/13 /15 > not using 11/29/14   DDX of  difficult airways management all start with A and  include Adherence, Ace Inhibitors, Acid Reflux, Active Sinus Disease, Alpha 1 Antitripsin deficiency, Anxiety masquerading as Airways dz,  ABPA,  allergy(esp in young), Aspiration (esp in elderly), Adverse effects of DPI,  Active smokers, plus two Bs  = Bronchiectasis and Beta blocker use..and one C= CHF   Adherence is always the initial "prime suspect" and is a multilayered concern that requires a "trust but verify" approach in every patient - starting with knowing how to use medications, especially inhalers, correctly, keeping up with refills and understanding the fundamental difference between maintenance and prns vs those medications only taken for a very short course and then stopped and not refilled.  - clearly not following med calendar or action plan to date, way overusing saba - The proper method of use, as well as anticipated side effects, of a metered-dose inhaler are discussed and demonstrated to the patient. Improved effectiveness after extensive coaching during this visit to a level of approximately  90% from a baseline of 50%  ? Acid (or non-acid) GERD > always difficult to exclude as up to 75% of pts in some series report no assoc GI/ Heartburn symptoms> rec max (24h)  acid suppression and diet restrictions/ reviewed and instructions given in writing.  Active smoking > see sep a/p

## 2014-12-03 NOTE — Assessment & Plan Note (Signed)

## 2014-12-26 ENCOUNTER — Telehealth: Payer: Self-pay | Admitting: Internal Medicine

## 2014-12-26 MED ORDER — MOMETASONE FURO-FORMOTEROL FUM 200-5 MCG/ACT IN AERO: 2.0000 | INHALATION_SPRAY | Freq: Two times a day (BID) | RESPIRATORY_TRACT | Status: DC

## 2014-12-26 NOTE — Telephone Encounter (Signed)
Sample is up front for pick up. Pt is aware. 

## 2014-12-27 ENCOUNTER — Ambulatory Visit: Payer: BC Managed Care – PPO | Admitting: Internal Medicine

## 2014-12-30 ENCOUNTER — Other Ambulatory Visit: Payer: Self-pay | Admitting: Family Medicine

## 2015-01-15 ENCOUNTER — Emergency Department (HOSPITAL_COMMUNITY): Payer: Medicare Other

## 2015-01-15 ENCOUNTER — Encounter (HOSPITAL_COMMUNITY): Payer: Self-pay | Admitting: Emergency Medicine

## 2015-01-15 ENCOUNTER — Emergency Department (HOSPITAL_COMMUNITY)
Admission: EM | Admit: 2015-01-15 | Discharge: 2015-01-15 | Disposition: A | Discharge status: Home / Self Care | Payer: Medicare Other | Attending: Emergency Medicine | Admitting: Emergency Medicine

## 2015-01-15 DIAGNOSIS — M1990 Unspecified osteoarthritis, unspecified site: Secondary | ICD-10-CM | POA: Insufficient documentation

## 2015-01-15 DIAGNOSIS — Z720 Tobacco use: Secondary | ICD-10-CM | POA: Insufficient documentation

## 2015-01-15 DIAGNOSIS — Z7982 Long term (current) use of aspirin: Secondary | ICD-10-CM | POA: Diagnosis not present

## 2015-01-15 DIAGNOSIS — J441 Chronic obstructive pulmonary disease with (acute) exacerbation: Secondary | ICD-10-CM | POA: Diagnosis not present

## 2015-01-15 DIAGNOSIS — K219 Gastro-esophageal reflux disease without esophagitis: Secondary | ICD-10-CM | POA: Diagnosis not present

## 2015-01-15 DIAGNOSIS — R000 Tachycardia, unspecified: Secondary | ICD-10-CM | POA: Insufficient documentation

## 2015-01-15 DIAGNOSIS — E119 Type 2 diabetes mellitus without complications: Secondary | ICD-10-CM | POA: Insufficient documentation

## 2015-01-15 DIAGNOSIS — Z7951 Long term (current) use of inhaled steroids: Secondary | ICD-10-CM | POA: Insufficient documentation

## 2015-01-15 DIAGNOSIS — J449 Chronic obstructive pulmonary disease, unspecified: Secondary | ICD-10-CM | POA: Diagnosis not present

## 2015-01-15 DIAGNOSIS — R509 Fever, unspecified: Secondary | ICD-10-CM | POA: Diagnosis not present

## 2015-01-15 DIAGNOSIS — G8929 Other chronic pain: Secondary | ICD-10-CM | POA: Diagnosis not present

## 2015-01-15 DIAGNOSIS — R0600 Dyspnea, unspecified: Secondary | ICD-10-CM

## 2015-01-15 DIAGNOSIS — Z79899 Other long term (current) drug therapy: Secondary | ICD-10-CM | POA: Diagnosis not present

## 2015-01-15 DIAGNOSIS — R05 Cough: Secondary | ICD-10-CM | POA: Diagnosis present

## 2015-01-15 LAB — CBC
HEMATOCRIT: 45.1 % (ref 39.0–52.0)
HEMOGLOBIN: 15.4 g/dL (ref 13.0–17.0)
MCH: 31.2 pg (ref 26.0–34.0)
MCHC: 34.1 g/dL (ref 30.0–36.0)
MCV: 91.5 fL (ref 78.0–100.0)
Platelets: 208 10*3/uL (ref 150–400)
RBC: 4.93 MIL/uL (ref 4.22–5.81)
RDW: 13.7 % (ref 11.5–15.5)
WBC: 7.3 10*3/uL (ref 4.0–10.5)

## 2015-01-15 LAB — BASIC METABOLIC PANEL
Anion gap: 8 (ref 5–15)
BUN: 6 mg/dL (ref 6–23)
CALCIUM: 8.9 mg/dL (ref 8.4–10.5)
CHLORIDE: 103 mmol/L (ref 96–112)
CO2: 25 mmol/L (ref 19–32)
Creatinine, Ser: 0.85 mg/dL (ref 0.50–1.35)
GFR calc Af Amer: >90 mL/min (ref >90)
GFR calc non Af Amer: >90 mL/min (ref >90)
GLUCOSE: 240 mg/dL — AB (ref 70–99)
Potassium: 3.6 mmol/L (ref 3.5–5.1)
Sodium: 136 mmol/L (ref 135–145)

## 2015-01-15 MED ORDER — PREDNISONE 20 MG PO TABS: 60.0000 mg | ORAL_TABLET | Freq: Every day | ORAL | Status: AC

## 2015-01-15 MED ORDER — HYDROCODONE-HOMATROPINE 5-1.5 MG/5ML PO SYRP: 5.0000 mL | ORAL_SOLUTION | Freq: Four times a day (QID) | ORAL | Status: DC | PRN

## 2015-01-15 MED ORDER — BENZONATATE 100 MG PO CAPS: ORAL_CAPSULE | ORAL | Status: DC

## 2015-01-15 MED ORDER — PREDNISONE 10 MG PO TABS
60.0000 mg | ORAL_TABLET | ORAL | Status: AC
  Administered 2015-01-15: 60 mg via ORAL
  Filled 2015-01-15 (×2): qty 1

## 2015-01-15 MED ORDER — ALBUTEROL SULFATE (2.5 MG/3ML) 0.083% IN NEBU
5.0000 mg | INHALATION_SOLUTION | Freq: Once | RESPIRATORY_TRACT | Status: AC
  Administered 2015-01-15: 5 mg via RESPIRATORY_TRACT
  Filled 2015-01-15: qty 6

## 2015-01-15 MED ORDER — IPRATROPIUM-ALBUTEROL 0.5-2.5 (3) MG/3ML IN SOLN
3.0000 mL | Freq: Once | RESPIRATORY_TRACT | Status: AC
  Administered 2015-01-15: 3 mL via RESPIRATORY_TRACT

## 2015-01-15 MED ORDER — IPRATROPIUM-ALBUTEROL 0.5-2.5 (3) MG/3ML IN SOLN
RESPIRATORY_TRACT | Status: AC
  Filled 2015-01-15: qty 3

## 2015-01-15 MED ORDER — ALBUTEROL SULFATE (2.5 MG/3ML) 0.083% IN NEBU
2.5000 mg | INHALATION_SOLUTION | Freq: Once | RESPIRATORY_TRACT | Status: AC
  Administered 2015-01-15: 2.5 mg via RESPIRATORY_TRACT

## 2015-01-15 MED ORDER — ALBUTEROL SULFATE (2.5 MG/3ML) 0.083% IN NEBU
INHALATION_SOLUTION | RESPIRATORY_TRACT | Status: AC
  Filled 2015-01-15: qty 3

## 2015-01-15 NOTE — ED Provider Notes (Signed)
CSN: [id number]     Arrival date & time 01/15/15  1533 History   First MD Initiated Contact with Patient 01/15/15 1550     Chief Complaint  Patient presents with  . Cough     (Consider location/radiation/quality/duration/timing/severity/associated sxs/prior Treatment) HPI Patient presents with concern of ongoing cough, dyspnea, lightheadedness with coughing. Patient has COPD, asthma, attributed to prior occupational exposure, and chronic cigarette use. This episode began about 3 days ago, has become worse over the illness, with no relief from home nebulizer treatment, or any user therapy. No chest pain.  There is Past Medical History  Diagnosis Date  . Diabetes mellitus   . COPD (chronic obstructive pulmonary disease)   . Sleep apnea   . Asthma   . Chronic pain of left knee   . Chronic back pain   . Shortness of breath   . GERD (gastroesophageal reflux disease)   . Arthritis    Past Surgical History  Procedure Laterality Date  . Carpal tunnel release Left   . Shoulder arthrocentesis Right   . Vasectomy    . Hernia repair Bilateral   . Knee arthroscopy with medial menisectomy Left 04/22/2014    Procedure: LEFT KNEE ARTHROSCOPY WITH MEDIAL MENISECTOMY;  Surgeon: Vickki Hearing, MD;  Location: [facility];  Service: Orthopedics;  Laterality: Left;   Family History  Problem Relation Age of Onset  . Emphysema Maternal Grandfather   . Emphysema Maternal Grandmother   . Clotting disorder Maternal Grandmother    History  Substance Use Topics  . Smoking status: Current Every Day Smoker -- 1.50 packs/day for 20 years    Types: Cigarettes    Start date: 12/17/1993  . Smokeless tobacco: Current User     Comment: using vap  . Alcohol Use: No    Review of Systems  Constitutional:       Per HPI, otherwise negative  HENT:       Per HPI, otherwise negative  Respiratory:       Per HPI, otherwise negative  Cardiovascular:       Per HPI, otherwise negative  Gastrointestinal:  Negative for vomiting.  Endocrine:       Negative aside from HPI  Genitourinary:       Neg aside from HPI   Musculoskeletal:       Per HPI, otherwise negative  Skin: Negative.   Neurological: Negative for syncope.      Allergies  Azithromycin; Erythromycin; Keflex; Metformin; Symbicort; and Doxycycline  Home Medications   Prior to Admission medications   Medication Sig Start Date End Date Taking? Authorizing Provider  albuterol (PROAIR HFA) 108 (90 BASE) MCG/ACT inhaler Inhale 2 puffs into the lungs every 4 (four) hours as needed for wheezing or shortness of breath (((PLAN A))).    Yes Historical Provider, MD  albuterol (PROVENTIL) (2.5 MG/3ML) 0.083% nebulizer solution USE ONE VIAL IN NEBULIZER FOUR TIMES DAILY Patient taking differently: USE ONE VIAL IN NEBULIZER FOUR TIMES DAILY as needed 09/12/14  Yes Merlyn Albert, MD  ALPRAZolam [name]) 1 MG tablet TAKE ONE TABLET BY MOUTH TWICE A DAY AS NEEDED FOR ANXIETY 10/04/14  Yes Merlyn Albert, MD  aspirin 325 MG EC tablet Take 325 mg by mouth 2 (two) times daily.    Yes Historical Provider, MD  Cinnamon 500 MG capsule Take 500 mg by mouth daily.   Yes Historical Provider, MD  citalopram (CELEXA) 40 MG tablet TAKE ONE (1) TABLET EACH DAY 12/30/14  Yes Merlyn Albert,  MD  glyBURIDE (DIABETA) 5 MG tablet TAKE TWO TABLETS BY MOUTH TWICE A DAY AFTER MEALS 12/01/14  Yes Merlyn AlbertWilliam S Luking, MD  ipratropium (ATROVENT) 0.02 % nebulizer solution Take 0.5 mg by nebulization 4 (four) times daily as needed for wheezing or shortness of breath.   Yes Historical Provider, MD  lansoprazole (PREVACID) 30 MG capsule Take 1 capsule (30 mg total) by mouth 2 (two) times daily before a meal. 12/01/14  Yes Merlyn AlbertWilliam S Luking, MD  mometasone-formoterol [facility](DULERA) 200-5 MCG/ACT AERO Inhale 2 puffs into the lungs 2 (two) times daily. 12/26/14  Yes Nyoka CowdenMichael B Wert, MD  Multiple Vitamin (MULTIVITAMIN WITH MINERALS) TABS Take 1 tablet by mouth every morning. Men's once  daily multivitamin packet (vitamin e, calcium, ginseng)   Yes Historical Provider, MD  oxyCODONE-acetaminophen (ROXICET) 5-325 MG per tablet Take 1 tablet by mouth every 6 (six) hours as needed for severe pain. Patient taking differently: Take 1 tablet by mouth 4 (four) times daily.  12/01/14  Yes Merlyn AlbertWilliam S Luking, MD  Saw Palmetto, Serenoa repens, (SAW PALMETTO PO) Take 1 capsule by mouth daily.   Yes Historical Provider, MD  Testosterone (ANDROGEL PUMP) 20.25 MG/ACT (1.62%) GEL 2 pumps daily 07/22/14  Yes Merlyn AlbertWilliam S Luking, MD  benzonatate (TESSALON) 100 MG capsule 1-2 po TID prn cough Patient not taking: Reported on 01/15/2015 09/21/14   Campbell Richesarolyn C Hoskins, NP  cetirizine (ZYRTEC) 10 MG tablet Take 10 mg by mouth daily as needed.     Historical Provider, MD  clotrimazole (MYCELEX) 10 MG troche Take 1 tablet (10 mg total) by mouth 5 (five) times daily. Patient taking differently: Take 10 mg by mouth 5 (five) times daily as needed (Thrush).  11/22/14   Campbell Richesarolyn C Hoskins, NP  Respiratory Therapy Supplies (FLUTTER) DEVI Use as directed Patient not taking: Reported on 01/15/2015 12/17/13   Nyoka CowdenMichael B Wert, MD   BP 144/84 mmHg  Pulse 108  Temp(Src) 98.7 F (37.1 C) (Oral)  Resp 24  Ht 5\' 10"  (1.778 m)  Wt 280 lb (127.007 kg)  BMI 40.18 kg/m2  SpO2 97% Physical Exam  Constitutional: He is oriented to person, place, and time. He appears well-developed. No distress.  HENT:  Head: Normocephalic and atraumatic.  Eyes: Conjunctivae and EOM are normal.  Cardiovascular: Regular rhythm.  Tachycardia present.   Pulmonary/Chest: Effort normal. No stridor. He has decreased breath sounds. He has wheezes in the left upper field and the left middle field.  Abdominal: He exhibits no distension.  Musculoskeletal: He exhibits no edema.  Neurological: He is alert and oriented to person, place, and time.  Skin: Skin is warm and dry.  Psychiatric: He has a normal mood and affect.  Nursing note and vitals  reviewed.   ED Course  Procedures (including critical care time) Labs Review Labs Reviewed  BASIC METABOLIC PANEL - Abnormal; Notable for the following:    Glucose, Bld 240 (*)    All other components within normal limits  CBC    Imaging Review Dg Chest 2 View (if Patient Has Fever And/or Copd)  01/15/2015   CLINICAL DATA:  Cough. Symptoms since Friday. Fever. COPD and asthma.  EXAM: CHEST  2 VIEW  COMPARISON:  11/14/2014.  FINDINGS: Hyperinflation is present compatible with asthma or COPD. The cardiopericardial silhouette is within normal limits. No airspace disease. No effusion. Mediastinal contours are normal. Mild thoracic spondylosis.  IMPRESSION: Hyperinflation compatible with emphysema or asthma.   Electronically Signed   By: Charolette ChildGeoffrey  Lamke M.D.  On: 01/15/2015 16:22     EKG Interpretation   Date/Time:  Sunday January 15 2015 15:40:01 EST Ventricular Rate:  93 PR Interval:  136 QRS Duration: 94 QT Interval:  370 QTC Calculation: 460 R Axis:   41 Text Interpretation:  Normal sinus rhythm Normal ECG Sinus rhythm Normal  ECG Confirmed by Gerhard Munch  MD [phone]) on 01/15/2015 3:44:54 PM     Update: Patient better.  Minimal wheezing in the left upper chest.  All labs, imaging reviewed with him. MDM  Patient with chronic pulmonary disease presents with ongoing dyspnea, cough. Here the patient is afebrile, not hypoxic, with no evidence for pulmonary was in, and low suspicion for occult pneumonia. Patient improved here with albuterol therapy, initiation of steroids.  He was discharged to follow-up with pulmonologist after initiation of short course of steroids at home, scheduled bronchodilators.      Gerhard Munch, MD 01/15/15 [phone]

## 2015-01-15 NOTE — Discharge Instructions (Signed)
As discussed, it is important that you follow up as soon as possible with your physicians for continued management of your condition.  If you develop any new, or concerning changes in your condition, please return to the emergency department immediately.  For the next 2 days, please take all bronchodilators every 4 hours.  You also need to take your steroids as directed.

## 2015-01-15 NOTE — ED Notes (Addendum)
Pt reports intermittent fever, productive cough. Moderate dyspnea with exertion and after coughing.

## 2015-01-16 ENCOUNTER — Other Ambulatory Visit: Payer: Self-pay | Admitting: Internal Medicine

## 2015-01-16 ENCOUNTER — Ambulatory Visit: Payer: Self-pay | Admitting: Internal Medicine

## 2015-01-16 DIAGNOSIS — R0600 Dyspnea, unspecified: Secondary | ICD-10-CM

## 2015-01-20 ENCOUNTER — Encounter: Payer: Self-pay | Admitting: Family Medicine

## 2015-01-20 ENCOUNTER — Ambulatory Visit (INDEPENDENT_AMBULATORY_CARE_PROVIDER_SITE_OTHER): Payer: Self-pay | Admitting: Family Medicine

## 2015-01-20 VITALS — BP 110/80 | Temp 98.3°F | Ht 70.0 in | Wt 281.1 lb

## 2015-01-20 DIAGNOSIS — J441 Chronic obstructive pulmonary disease with (acute) exacerbation: Secondary | ICD-10-CM | POA: Diagnosis not present

## 2015-01-20 MED ORDER — LEVOFLOXACIN 500 MG PO TABS: 500.0000 mg | ORAL_TABLET | Freq: Every day | ORAL | Status: DC

## 2015-01-20 MED ORDER — BENZONATATE 100 MG PO CAPS: 100.0000 mg | ORAL_CAPSULE | Freq: Four times a day (QID) | ORAL | Status: DC | PRN

## 2015-01-20 MED ORDER — PREDNISONE 20 MG PO TABS: ORAL_TABLET | ORAL | Status: DC

## 2015-01-20 NOTE — Progress Notes (Signed)
   Subjective:    Patient ID: Joseph [name], male    DOB: 1971-02-07, 44 y.o.   MRN: [id number]  Cough This is a new problem. The current episode started in the past 7 days. The problem has been unchanged. The cough is productive of sputum. Associated symptoms include nasal congestion and wheezing. Associated symptoms comments: congestion. Nothing aggravates the symptoms. He has tried oral steroids and prescription cough suppressant for the symptoms. The treatment provided no relief.  [facility] this issue. Patient was given hycodan and prednisone with no relief.   Not given antibiotics productive of green phlegm  Review of Systems  Respiratory: Positive for cough and wheezing.    no vomiting no diarrhea no rash     Objective:   Physical Exam Alert no acute distress HEENT moderate nasal congestion pharynx normal lungs bronchial cough wheezy no tachypnea no crackles heart rare rhythm       Assessment & Plan:  Impression rhinosinusitis/bronchitis with exacerbation of COPD plan antibiotics prescribed. Steroids extended. Since Medicare discussed. WSL

## 2015-01-23 ENCOUNTER — Telehealth: Payer: Self-pay | Admitting: Internal Medicine

## 2015-01-23 NOTE — Telephone Encounter (Signed)
Samples have been placed up front for pick up. Pt is aware. 

## 2015-02-01 ENCOUNTER — Other Ambulatory Visit: Payer: Self-pay | Admitting: Family Medicine

## 2015-02-03 NOTE — Telephone Encounter (Signed)
Ok plus 5 mo

## 2015-03-03 ENCOUNTER — Ambulatory Visit (INDEPENDENT_AMBULATORY_CARE_PROVIDER_SITE_OTHER): Payer: Medicare Other | Admitting: Family Medicine

## 2015-03-03 ENCOUNTER — Encounter: Payer: Self-pay | Admitting: Family Medicine

## 2015-03-03 VITALS — BP 132/82 | Ht 70.0 in | Wt 285.0 lb

## 2015-03-03 DIAGNOSIS — G894 Chronic pain syndrome: Secondary | ICD-10-CM

## 2015-03-03 DIAGNOSIS — E119 Type 2 diabetes mellitus without complications: Secondary | ICD-10-CM | POA: Diagnosis not present

## 2015-03-03 DIAGNOSIS — L731 Pseudofolliculitis barbae: Secondary | ICD-10-CM | POA: Diagnosis not present

## 2015-03-03 DIAGNOSIS — L663 Perifolliculitis capitis abscedens: Secondary | ICD-10-CM

## 2015-03-03 LAB — POCT GLYCOSYLATED HEMOGLOBIN (HGB A1C): Hemoglobin A1C: 8

## 2015-03-03 MED ORDER — AMOXICILLIN-POT CLAVULANATE 875-125 MG PO TABS: 1.0000 | ORAL_TABLET | Freq: Two times a day (BID) | ORAL | Status: AC

## 2015-03-03 MED ORDER — OXYCODONE-ACETAMINOPHEN 5-325 MG PO TABS: 1.0000 | ORAL_TABLET | Freq: Four times a day (QID) | ORAL | Status: DC | PRN

## 2015-03-03 NOTE — Progress Notes (Signed)
   Subjective:    Patient ID: Joseph ForthJerry R Dark, male    DOB: 04/26/1971, 44 y.o.   MRN: [id number]  Diabetes He presents for his follow-up diabetic visit. He has type 2 diabetes mellitus. Risk factors for coronary artery disease include diabetes mellitus and hypertension. Current diabetic treatment includes oral agent (monotherapy).   Patient needs refill of chronic pain meds. States the pain medication definitely helps him. Without it he cannot function.  Notes bumpy rash in scalp. Tender. Positive discharge. Over the last couple weeks.  Results for orders placed or performed in visit on 03/03/15  POCT glycosylated hemoglobin (Hb A1C)  Result Value Ref Range   Hemoglobin A1C 8    Breathing overall better but worse on bad days, continues to see by pulmonary specialist. Next  Disability has now been approved for patient.  Smoking down to one ppd  Review of Systems No headache no chest pain ongoing chronic back pain chronic shoulder pain no change in urinary or bowel habits    Objective:   Physical Exam Alert vital stable blood pressure good HEENT scalp folliculitis present. Lungs diminished breath sounds heart rare rhythm ankles without edema       Assessment & Plan:  Impression 1 type 2 diabetes suboptimal in discussed #2 chronic pain discussed #3 folliculitis of scalp plan Augmentin twice a day 10 days. Pain medications refilled. Diet exercise discussed. Recheck in several months. If A1c still this high will need to add additional medicine WSL

## 2015-03-07 ENCOUNTER — Other Ambulatory Visit: Payer: Self-pay | Admitting: Family Medicine

## 2015-03-08 ENCOUNTER — Ambulatory Visit (INDEPENDENT_AMBULATORY_CARE_PROVIDER_SITE_OTHER): Payer: Medicare Other | Admitting: Internal Medicine

## 2015-03-08 ENCOUNTER — Encounter: Payer: Self-pay | Admitting: Internal Medicine

## 2015-03-08 VITALS — BP 104/70 | HR 90 | Ht 70.0 in | Wt 284.0 lb

## 2015-03-08 DIAGNOSIS — J45909 Unspecified asthma, uncomplicated: Secondary | ICD-10-CM

## 2015-03-08 DIAGNOSIS — R0600 Dyspnea, unspecified: Secondary | ICD-10-CM | POA: Diagnosis not present

## 2015-03-08 DIAGNOSIS — F17200 Nicotine dependence, unspecified, uncomplicated: Secondary | ICD-10-CM

## 2015-03-08 DIAGNOSIS — F17210 Nicotine dependence, cigarettes, uncomplicated: Secondary | ICD-10-CM

## 2015-03-08 LAB — PULMONARY FUNCTION TEST
DL/VA % pred: 107 %
DL/VA: 4.99 ml/min/mmHg/L
DLCO unc % pred: 83 %
DLCO unc: 27.14 ml/min/mmHg
FEF 25-75 Post: 4.03 L/sec
FEF 25-75 Pre: 4.27 L/sec
FEF2575-%Change-Post: -5 %
FEF2575-%Pred-Post: 108 %
FEF2575-%Pred-Pre: 114 %
FEV1-%CHANGE-POST: 0 %
FEV1-%Pred-Post: 72 %
FEV1-%Pred-Pre: 72 %
FEV1-PRE: 2.97 L
FEV1-Post: 2.95 L
FEV1FVC-%Change-Post: -6 %
FEV1FVC-%PRED-PRE: 110 %
FEV6-%CHANGE-POST: 6 %
FEV6-%PRED-POST: 71 %
FEV6-%Pred-Pre: 67 %
FEV6-POST: 3.59 L
FEV6-Pre: 3.38 L
FEV6FVC-%PRED-POST: 103 %
FEV6FVC-%PRED-PRE: 103 %
FVC-%CHANGE-POST: 6 %
FVC-%PRED-POST: 69 %
FVC-%PRED-PRE: 65 %
FVC-POST: 3.59 L
FVC-Pre: 3.38 L
Post FEV1/FVC ratio: 82 %
Post FEV6/FVC ratio: 100 %
Pre FEV1/FVC ratio: 88 %
Pre FEV6/FVC Ratio: 100 %
RV % PRED: 121 %
RV: 2.34 L
TLC % pred: 85 %
TLC: 5.96 L

## 2015-03-08 MED ORDER — ALBUTEROL SULFATE HFA 108 (90 BASE) MCG/ACT IN AERS: INHALATION_SPRAY | RESPIRATORY_TRACT | Status: DC

## 2015-03-08 NOTE — Progress Notes (Signed)
PFT done today. 

## 2015-03-08 NOTE — Progress Notes (Signed)
Subjective:   Patient ID: Joseph [name], male    DOB: 11-Oct-1971   MRN: [id number]    Brief patient profile:  2 yowm active smoker without significant airflow obst by PFTs 06/02/12 and 03/08/2015    History of Present Illness  IOV 04/10/12   In April 2013 reports   had pneumonia apparently. Went to [facility] ER and found some abnormality (nodule based on review of ER notes) on CXR that resulted in CT Angio 03/07/12 same visit. PE ruled out but found to have mediastinal nodes but the cxr nodule was not there. Apparently ER doctor said it was lymphoma and asked to see an oncologist. But primary care doctor felt that was over zealous and referred patient here. He prefers conservative mgmt to nodes if clinical suspicion for cancer is low  Currently, feels baseline which is lack of energy (spent most of days yesterday in bed), esp in pollen, chronic dyspnea on exertion for 180 feet relieved by rest  But slowly progressive since 2002 for which he uses albuterol prn on average 4 times a day.  Walkt test 185 feet x 3 laps: no desaturation  He smokes at baseline; trying quit, prior intolerance (dreams) with chantix.   Only baseline mild chronic cough with associtaed wheeze that is occasional.. Does have some B symptoms like nocturnal diaphoresis (soaks pillows). Denies weight loss.   CT ANGIOGRAPHY CHEST  Comparison: 03/07/2012  An abnormal right lower paratracheal node has a short axis diameter  of 1.6 cm. An abnormal AP window lymph node has a short axis  diameter of 1.4 cm. Mild bilateral hilar and infrahilar adenopathy  noted. Scattered small axillary lymph nodes are present.  A subcarinal node has a short axis diameter of 1.5 cm.     OV 08/14/2012 Mediastinal nodes: PET scan 06/10/12 shows very low uptake in the mediastinal nodes. Suspicion for cancer is very low. Lymph nodes include 13 mm right lower paratracheal lymph node and 10 mm prevascular lymph node. ACE level 04/10/12  - is in 20s and  normal  Still dyspneic: 1 flught of steps and better with rest and with albuterol and atrovent prn. Dyspne also worse in winter and cooler weathers. Rates it as moderate. STable since last visit to slightly worse. thre is some associtaed cough. No asociated weight loss. COugh is moderate and worst early in morning and in cooler temperatues.. Denies sinus drainage. PFts show mixed picture - fvc 3.4L/69%, fev1 2.9/76%, Ratio 85 but 12% bd respose. TLC 5.7/83%. DLCO 23.7/69% rec rec #MEdiastinal node  - suspicion for cancer is low  - you will need CT chest in a year; we will schedule it later  #Smoking  - glad you are working on quitting  #Shortness of breath - this is due to asthma/copd and weight  - please start QVAR 2 puff twice daily - take sample and show technique and take prescription too  - focus on weight loss through the low glycemic diet; take diet sheet from [place]  #FOllowup  - 3 months with spirometry at followup  - flu shot and pneumovax through PMD as discussed   12/17/2013  acute ov/Fiona Coto still active smoker re: recurrent pattern of coughing x a decade intermittent / out of control since nov 2014/ can't take symbicort makes it worse / on percocet one tid at baseline  Chief Complaint  Patient presents with  . Acute Visit    MR pt.  C/o mostly nonprod cough with some thick white mucous, coughing  until he almost passes out.  [facility] to [employer] [facility] last week.    duoneb daily  Sometime skips the doses later in the day but always uses the am dose, sputum to purulent. Mostly sob with cough/ otherwise breaths ok Last neb was 4 h prior to OV   rec For Cough - use the flutter valve as much as you can plus mucinex dm 1200 mg every 12 hours as needed supplement with percocet up to 2 every 4hours For Breathing  Only use your duoneb  rescue medication . - Ok to use up to    every 4 hours if you must  Prednisone 10 mg take  4 each am x 2 days,   2 each am x 2 days,  1 each am x 2 days and  stop  Pantoprazole (protonix) 40 mg   Take 30-60 min before first meal of the day and Pepcid 20 mg one bedtime until return to office - this is the best way to tell whether stomach acid is contributing to your problem.  GERD  Diet . Please schedule a follow up office visit in 2 weeks, sooner if needed with all meds in hand  07/27/2014 f/u ov/Dreonna Hussein re: asthma / still smoking  Chief Complaint  Patient presents with  . Follow-up    Pt c/o increased SOB and cough for the past month- cough is occ prod with minimal clear sputum. Pt currently taking clindamycin for staph infection. Rarely using albuterol inhaler, but is using neb approx 4 times per day.   off dulera x 1.5 week denies more sob/ cough or need for rescue on vs off rec Plan A=  dulera 200 Take 2 puffs first thing in am and then another 2 puffs about 12 hours later (or formulary alternative- call us if you find a cheaper one)  Plan B =  Backup = proaire  Plan C = Backup plan B = nebulized albuterol up to 4 hours but call if needing this much at all The key is to stop smoking completely before smoking completely stops you!    11/29/2014 f/u ov/Mehki Klumpp re: s/p "aecopd" still smoking  Chief Complaint  Patient presents with  . Follow-up    Pt states that his SOB and cough have been worse for the past several wks. He went to ED for COPD exacerbation on 11/14/14.  He is using proair 3-4 times per day and neb about 3 times per day on average.   one month prior to flare needing proair 1-2 per day and neb at least once or twice also - using neb first thing instead of dulera  Better p pred from er then worse again w/in a week of taper off assoc with congested cough but mostly white mucus esp in ams rec Think of your respiratory medications as multiple steps you can take to control your symptoms and avoid having to go to the ER Plan A is your maintenance daily no matter what meds: dulera Take 2 puffs first thing in am and then another 2 puffs about  12 hours later.  Plan B only use after you've used your maintenance (Plan A) medication, and only if you can't catch your breath: Proair 2 every 4 hours as needed  Plan C only use after you've used plan A and B and still can't catch your breath: nebulizer albuterol 2.5 mg up to every 4 hours  Plan D(for Doctor):  If you've used A thru C and not doing a lot  better or still needing C more than a once a day,  D = call the doctor for evaluation asap Plan E (for ER):  If still not able to catch your breath, even after using your nebulizer up to every 4 hours, go to ER  For cough > delsym 2 tsp every 12 hours is the best you can buy Prevacid 30 mg Take 30-60 min before first meal of the day  GERD diet      03/08/2015 f/u ov/Shaelyn Decarli re: still smoking / off dulera x 48 h and off saba hfa/ using neb 8 am of test /no change in symptoms or need for saba on or off dulera  Chief Complaint  Patient presents with  . Follow-up    PFT done today. Pt states that his breathing is about the same since last visit. He is using albuterol inhaler approx 3 x per day.   breathing does better if stays in house,  worse out in cold Even on dulera still using neb at least twice daily  Some sporadic dry fits of  cough with bending over even on dulera but not using ppi correctly     No obvious day to day or daytime variabilty or assoc cp or  subjective wheeze overt sinus or hb symptoms. No unusual exp hx or h/o childhood pna/ asthma or knowledge of premature birth.  Sleeping ok without nocturnal  or early am exacerbation  of respiratory  c/o's or need for noct saba. Also denies any obvious fluctuation of symptoms with weather or environmental changes or other aggravating or alleviating factors except as outlined above   Current Medications, Allergies, Complete Past Medical History, Past Surgical History, Family History, and Social History were reviewed in [employer] record.  ROS  The following are not  active complaints unless bolded sore throat, dysphagia, dental problems, itching, sneezing,  nasal congestion or excess/ purulent secretions, ear ache,   fever, chills, sweats, unintended wt loss, pleuritic or exertional cp, hemoptysis,  orthopnea pnd or leg swelling, presyncope, palpitations, heartburn, abdominal pain, anorexia, nausea, vomiting, diarrhea  or change in bowel or urinary habits, change in stools or urine, dysuria,hematuria,  rash, arthralgias, visual complaints, headache, numbness weakness or ataxia or problems with walking or coordination,  change in mood/affect or memory.              Objective:   Physical Exam  amb obese bm nad    12/31/2013       294  > 294 01/14/2014 > 02/28/2014  292 >  07/27/2014  286 > 11/29/2014   289> 03/08/2015  284     HEENT: nl dentition, turbinates, and orophanx. Nl external ear canals without cough reflex   NECK :  without JVD/Nodes/TM/ nl carotid upstrokes bilaterally   LUNGS: no acc muscle use, clear to A and P bilaterally with    CV:  RRR  no s3 or murmur or increase in P2, no edema   ABD:  soft and nontender with nl excursion in the supine position. No bruits or organomegaly, bowel sounds nl  MS:  warm without deformities, calf tenderness, cyanosis or clubbing  SKIN: warm and dry without lesions    NEURO:  alert, approp, no deficits          11/14/14 No active cardiopulmonary disease.            Assessment & Plan:

## 2015-03-08 NOTE — Patient Instructions (Addendum)
Prevacid 30 mg should be to Take 30- 60 min before your first and last meals of the day   I see no evidence that the dulera is helping you and would stop it at this point pending an asthma challenge test   Please see patient coordinator before you leave today  to schedule Methacholine test - do not take any albuterol with in 6 hours of the test   I will call you with results of methacholine test  The key is to stop smoking completely before smoking completely stops you!

## 2015-03-08 NOTE — Assessment & Plan Note (Signed)
>   3 min discussion I reviewed the Flethcher curve with patient that basically indicates  if you quit smoking when your best day FEV1 is still well preserved (as is clearly  the case here)  it is highly unlikely you will progress to severe disease and informed the patient there was no medication on the market that has proven to change the curve or the likelihood of progression.  Therefore stopping smoking and maintaining abstinence is the most important aspect of care, not choice of inhalers or for that matter, doctors.    rec transition completely to E cigs as a one way bridge off cigarettes

## 2015-03-08 NOTE — Assessment & Plan Note (Signed)
-   PFT's s sign copd 06/2012 - med calendar 2/13 /15 > not using 11/29/14  - 11/29/2014 p extensive coaching HFA effectiveness =    90% from a baseline of 50%  -PFTs wnl 03/08/2015 4 h p alb neb> rec MCT at least 6 h p neb   I had an extended discussion with the patient reviewing all relevant studies completed to date and  lasting 15 to 20 minutes of a 25 minute visit on the following ongoing concerns:  1) in retrospect we can say for sure this is not copd  2) could have cough variant asthma or occult asthma that he "covers up" by the hour of ov but if so the dulera should have improved the symptoms and reduced the dependency on saba and it did neither  3) best approach is MCT while on max gerd rx but no more dulera in meantime   4)  Each maintenance medication was reviewed in detail including most importantly the difference between maintenance and as needed and under what circumstances the prns are to be used.  Please see instructions for details which were reviewed in writing and the patient given a copy.

## 2015-03-15 ENCOUNTER — Telehealth: Payer: Self-pay | Admitting: Internal Medicine

## 2015-03-15 NOTE — Telephone Encounter (Signed)
Per 03/08/15 OV : Patient Instructions       Prevacid 30 mg should be to Take 30- 60 min before your first and last meals of the day  I see no evidence that the dulera is helping you and would stop it at this point pending an asthma challenge test  Please see patient coordinator before you leave today  to schedule Methacholine test - do not take any albuterol with in 6 hours of the test  I will call you with results of methacholine test The key is to stop smoking completely before smoking completely stops you  ----   [facility] Called pt and LMTCB x1

## 2015-03-16 NOTE — Telephone Encounter (Signed)
Spoke with pt, states that his sob and chest tightness has worsened since coming off dulera, and is wanting to restart.   MW are you ok with pt restarting this med?  Thanks!

## 2015-03-16 NOTE — Telephone Encounter (Signed)
LMTC x 1  

## 2015-03-16 NOTE — Telephone Encounter (Signed)
That's fine but really needs to do the methacholine test tosort this out - hold the dulera for 24 h before the test but go ahead and schedule it and keep the appointment

## 2015-03-17 MED ORDER — MOMETASONE FURO-FORMOTEROL FUM 200-5 MCG/ACT IN AERO: 2.0000 | INHALATION_SPRAY | Freq: Two times a day (BID) | RESPIRATORY_TRACT | Status: DC

## 2015-03-17 NOTE — Telephone Encounter (Signed)
Spoke with pt to relay MW's recs to pt.  He is aware, and will not take any dulera between now and his methacholine challenge test, but asks for a sample to be placed up front for pickup after his test on Monday.  This has been placed up front.  Nothing further needed.

## 2015-03-20 ENCOUNTER — Emergency Department (HOSPITAL_COMMUNITY): Payer: Medicare Other

## 2015-03-20 ENCOUNTER — Encounter (HOSPITAL_COMMUNITY): Payer: Self-pay | Admitting: Emergency Medicine

## 2015-03-20 ENCOUNTER — Encounter (HOSPITAL_COMMUNITY): Payer: Medicare Other

## 2015-03-20 ENCOUNTER — Emergency Department (HOSPITAL_COMMUNITY)
Admission: EM | Admit: 2015-03-20 | Discharge: 2015-03-20 | Disposition: A | Discharge status: Home / Self Care | Payer: Medicare Other | Attending: Emergency Medicine | Admitting: Emergency Medicine

## 2015-03-20 DIAGNOSIS — M545 Low back pain, unspecified: Secondary | ICD-10-CM

## 2015-03-20 DIAGNOSIS — E119 Type 2 diabetes mellitus without complications: Secondary | ICD-10-CM | POA: Diagnosis not present

## 2015-03-20 DIAGNOSIS — M1990 Unspecified osteoarthritis, unspecified site: Secondary | ICD-10-CM | POA: Diagnosis not present

## 2015-03-20 DIAGNOSIS — J449 Chronic obstructive pulmonary disease, unspecified: Secondary | ICD-10-CM | POA: Diagnosis not present

## 2015-03-20 DIAGNOSIS — J984 Other disorders of lung: Secondary | ICD-10-CM | POA: Diagnosis not present

## 2015-03-20 DIAGNOSIS — R109 Unspecified abdominal pain: Secondary | ICD-10-CM | POA: Insufficient documentation

## 2015-03-20 DIAGNOSIS — Z79899 Other long term (current) drug therapy: Secondary | ICD-10-CM | POA: Insufficient documentation

## 2015-03-20 DIAGNOSIS — K219 Gastro-esophageal reflux disease without esophagitis: Secondary | ICD-10-CM | POA: Insufficient documentation

## 2015-03-20 DIAGNOSIS — R1030 Lower abdominal pain, unspecified: Secondary | ICD-10-CM | POA: Diagnosis not present

## 2015-03-20 DIAGNOSIS — Z720 Tobacco use: Secondary | ICD-10-CM | POA: Diagnosis not present

## 2015-03-20 DIAGNOSIS — G8929 Other chronic pain: Secondary | ICD-10-CM | POA: Diagnosis not present

## 2015-03-20 DIAGNOSIS — Z7951 Long term (current) use of inhaled steroids: Secondary | ICD-10-CM | POA: Diagnosis not present

## 2015-03-20 LAB — URINALYSIS, ROUTINE W REFLEX MICROSCOPIC
Bilirubin Urine: NEGATIVE
Glucose, UA: 250 mg/dL — AB
Hgb urine dipstick: NEGATIVE
Ketones, ur: NEGATIVE mg/dL
Leukocytes, UA: NEGATIVE
NITRITE: NEGATIVE
Protein, ur: NEGATIVE mg/dL
SPECIFIC GRAVITY, URINE: 1.025 (ref 1.005–1.030)
UROBILINOGEN UA: 0.2 mg/dL (ref 0.0–1.0)
pH: 5.5 (ref 5.0–8.0)

## 2015-03-20 MED ORDER — DIAZEPAM 5 MG PO TABS: 5.0000 mg | ORAL_TABLET | Freq: Three times a day (TID) | ORAL | Status: DC | PRN

## 2015-03-20 MED ORDER — METHOCARBAMOL 500 MG PO TABS: 500.0000 mg | ORAL_TABLET | Freq: Two times a day (BID) | ORAL | Status: DC

## 2015-03-20 MED ORDER — OXYCODONE-ACETAMINOPHEN 10-325 MG PO TABS: 1.0000 | ORAL_TABLET | ORAL | Status: DC | PRN

## 2015-03-20 NOTE — ED Notes (Signed)
Pt reports he [place] the [place], [place] and pt caught it. Pt c/o low back pain and abdominal cramping. Pt states he has pain running into his groin. Pt has hx of prior hernia surgery.

## 2015-03-20 NOTE — Discharge Instructions (Signed)
Back Pain, Adult Low back pain is very common. About 1 in 5 people have back pain.The cause of low back pain is rarely dangerous. The pain often gets better over time.About half of people with a sudden onset of back pain feel better in just 2 weeks. About 8 in 10 people feel better by 6 weeks.  CAUSES Some common causes of back pain include:  Strain of the muscles or ligaments supporting the spine.  Wear and tear (degeneration) of the spinal discs.  Arthritis.  Direct injury to the back. DIAGNOSIS Most of the time, the direct cause of low back pain is not known.However, back pain can be treated effectively even when the exact cause of the pain is unknown.Answering your caregiver's questions about your overall health and symptoms is one of the most accurate ways to make sure the cause of your pain is not dangerous. If your caregiver needs more information, he or she may order lab work or imaging tests (X-rays or MRIs).However, even if imaging tests show changes in your back, this usually does not require surgery. HOME CARE INSTRUCTIONS For many people, back pain returns.Since low back pain is rarely dangerous, it is often a condition that people can learn to manageon their own.   Remain active. It is stressful on the back to sit or stand in one place. Do not sit, drive, or stand in one place for more than 30 minutes at a time. Take short walks on level surfaces as soon as pain allows.Try to increase the length of time you walk each day.  Do not stay in bed.Resting more than 1 or 2 days can delay your recovery.  Do not avoid exercise or work.Your body is made to move.It is not dangerous to be active, even though your back may hurt.Your back will likely heal faster if you return to being active before your pain is gone.  Pay attention to your body when you bend and lift. Many people have less discomfortwhen lifting if they bend their knees, keep the load close to their bodies,and  avoid twisting. Often, the most comfortable positions are those that put less stress on your recovering back.  Find a comfortable position to sleep. Use a firm mattress and lie on your side with your knees slightly bent. If you lie on your back, put a pillow under your knees.  Only take over-the-counter or prescription medicines as directed by your caregiver. Over-the-counter medicines to reduce pain and inflammation are often the most helpful.Your caregiver may prescribe muscle relaxant drugs.These medicines help dull your pain so you can more quickly return to your normal activities and healthy exercise.  Put ice on the injured area.  Put ice in a plastic bag.  Place a towel between your skin and the bag.  Leave the ice on for 15-20 minutes, 03-04 times a day for the first 2 to 3 days. After that, ice and heat may be alternated to reduce pain and spasms.  Ask your caregiver about trying back exercises and gentle massage. This may be of some benefit.  Avoid feeling anxious or stressed.Stress increases muscle tension and can worsen back pain.It is important to recognize when you are anxious or stressed and learn ways to manage it.Exercise is a great option. SEEK MEDICAL CARE IF:  You have pain that is not relieved with rest or medicine.  You have pain that does not improve in 1 week.  You have new symptoms.  You are generally not feeling well. SEEK   IMMEDIATE MEDICAL CARE IF:   You have pain that radiates from your back into your legs.  You develop new bowel or bladder control problems.  You have unusual weakness or numbness in your arms or legs.  You develop nausea or vomiting.  You develop abdominal pain.  You feel faint. Document Released: 11/18/2005 Document Revised: 05/19/2012 Document Reviewed: 03/22/2014 ExitCare Patient Information 2015 [employer]. This information is not intended to replace advice given to you by your health care provider. Make sure you  discuss any questions you have with your health care provider.  

## 2015-03-20 NOTE — ED Provider Notes (Signed)
CSN: [id number]     Arrival date & time 03/20/15  1013 History  This chart was scribed for Rolland PorterMark Edgard Debord, MD by Richarda Overlieichard Holland, ED Scribe. This patient was seen in room APA18/APA18 and the patient's care was started 12:34 PM.  Chief Complaint  Patient presents with  . Abdominal Pain  . Back Pain   The history is provided by the patient. No language interpreter was used.   HPI Comments: Joseph [name] is a 44 y.o. male with a history of DM, COPD, asthma, arthritis and GERD who presents to the Emergency Department complaining of severe lower back pain that started 2 days ago after an injury. He states that he was [place] a lov[place]at [place] the dump when he slipped, twisted and had to readjust his weight. Pt states he currently has a percocet prescription from his PCP, Dr. Gerda DissLuking. He reports no alleviating factors at this time.   Pt also complains of lower abdominal pain and describes it as cramping. He states that he has pain radiating into his groin as well. Pt reports a history of hernia repair in 2010 and states that they placed mesh at that time.   Past Medical History  Diagnosis Date  . Diabetes mellitus   . COPD (chronic obstructive pulmonary disease)   . Sleep apnea   . Asthma   . Chronic pain of left knee   . Chronic back pain   . Shortness of breath   . GERD (gastroesophageal reflux disease)   . Arthritis    Past Surgical History  Procedure Laterality Date  . Carpal tunnel release Left   . Shoulder arthrocentesis Right   . Vasectomy    . Hernia repair Bilateral   . Knee arthroscopy with medial menisectomy Left 04/22/2014    Procedure: LEFT KNEE ARTHROSCOPY WITH MEDIAL MENISECTOMY;  Surgeon: Vickki HearingStanley E Harrison, MD;  Location: [facility];  Service: Orthopedics;  Laterality: Left;   Family History  Problem Relation Age of Onset  . Emphysema Maternal Grandfather   . Emphysema Maternal Grandmother   . Clotting disorder Maternal Grandmother    History  Substance Use Topics  . Smoking  status: Current Every Day Smoker -- 1.50 packs/day for 20 years    Types: Cigarettes    Start date: 12/17/1993  . Smokeless tobacco: Current User     Comment: using vap  . Alcohol Use: No    Review of Systems  Constitutional: Negative for diaphoresis and appetite change.  HENT: Negative for mouth sores and trouble swallowing.   Eyes: Negative for visual disturbance.  Respiratory: Negative for chest tightness and wheezing.   Gastrointestinal: Positive for abdominal pain. Negative for abdominal distention.  Endocrine: Negative for polydipsia, polyphagia and polyuria.  Genitourinary: Negative for frequency.  Musculoskeletal: Positive for back pain. Negative for gait problem.  Skin: Negative for color change, pallor and rash.  Neurological: Negative for dizziness, syncope and light-headedness.  Hematological: Does not bruise/bleed easily.  Psychiatric/Behavioral: Negative for behavioral problems and confusion.    Allergies  Azithromycin; Erythromycin; Keflex; Metformin; Symbicort; and Doxycycline  Home Medications   Prior to Admission medications   Medication Sig Start Date End Date Taking? Authorizing Provider  albuterol (PROAIR HFA) 108 (90 BASE) MCG/ACT inhaler 2 puffs every 4 hours if needed 03/08/15  Yes Nyoka CowdenMichael B Wert, MD  albuterol (PROVENTIL) (2.5 MG/3ML) 0.083% nebulizer solution USE ONE VIAL IN NEBULIZER FOUR TIMES DAILY Patient taking differently: USE ONE VIAL IN NEBULIZER FOUR TIMES DAILY as needed 09/12/14  Yes Chrissie NoaWilliam  Fletcher Anon, MD  ALPRAZolam [name]) 1 MG tablet TAKE ONE TABLET BY MOUTH TWICE A DAY AS NEEDED FOR ANXIETY 10/04/14  Yes Merlyn Albert, MD  ANDROGEL PUMP 20.25 MG/ACT (1.62%) GEL USE 2 PUMPS DAILY 02/03/15  Yes Merlyn Albert, MD  aspirin 325 MG EC tablet Take 325 mg by mouth 2 (two) times daily.    Yes Historical Provider, MD  cetirizine (ZYRTEC) 10 MG tablet Take 10 mg by mouth daily as needed.    Yes Historical Provider, MD  Cinnamon 500 MG capsule Take  500 mg by mouth daily.   Yes Historical Provider, MD  citalopram (CELEXA) 40 MG tablet TAKE ONE (1) TABLET EACH DAY 12/30/14  Yes Merlyn Albert, MD  clotrimazole (MYCELEX) 10 MG troche Take 1 tablet (10 mg total) by mouth 5 (five) times daily. Patient taking differently: Take 10 mg by mouth 5 (five) times daily as needed (Thrush).  11/22/14  Yes Campbell Riches, NP  glyBURIDE (DIABETA) 5 MG tablet TAKE TWO TABLETS BY MOUTH TWICE DAILY AFTER MEALS 03/07/15  Yes Merlyn Albert, MD  ipratropium (ATROVENT) 0.02 % nebulizer solution Take 0.5 mg by nebulization 4 (four) times daily as needed for wheezing or shortness of breath.   Yes Historical Provider, MD  lansoprazole (PREVACID) 30 MG capsule Take 1 capsule (30 mg total) by mouth 2 (two) times daily before a meal. 12/01/14  Yes Merlyn Albert, MD  mometasone-formoterol [facility]) 200-5 MCG/ACT AERO Inhale 2 puffs into the lungs 2 (two) times daily. 03/17/15  Yes Nyoka Cowden, MD  Multiple Vitamin (MULTIVITAMIN WITH MINERALS) TABS Take 1 tablet by mouth every morning. Men's once daily multivitamin packet (vitamin e, calcium, ginseng)   Yes Historical Provider, MD  Saw Palmetto, Serenoa repens, (SAW PALMETTO PO) Take 1 capsule by mouth daily.   Yes Historical Provider, MD  diazepam (VALIUM) 5 MG tablet Take 1 tablet (5 mg total) by mouth every 8 (eight) hours as needed for anxiety (muscle spasm). 03/20/15   Rolland Porter, MD  methocarbamol (ROBAXIN) 500 MG tablet Take 1 tablet (500 mg total) by mouth 2 (two) times daily. 03/20/15   Rolland Porter, MD  oxyCODONE-acetaminophen (PERCOCET) 10-325 MG per tablet Take 1 tablet by mouth every 4 (four) hours as needed for pain. 03/20/15   Rolland Porter, MD   BP 131/90 mmHg  Pulse 101  Temp(Src) 98.1 F (36.7 C) (Oral)  Resp 17  Ht  (1.778 m)  Wt 280 lb (127.007 kg)  BMI 40.18 kg/m2  SpO2 98% Physical Exam  Constitutional: He is oriented to person, place, and time. He appears well-developed and  well-nourished. No distress.  HENT:  Head: Normocephalic.  Eyes: Conjunctivae are normal. Pupils are equal, round, and reactive to light. No scleral icterus.  Neck: Normal range of motion. Neck supple. No thyromegaly present.  Cardiovascular: Normal rate and regular rhythm.  Exam reveals no gallop and no friction rub.   No murmur heard. Pulmonary/Chest: Effort normal and breath sounds normal. No respiratory distress. He has no wheezes. He has no rales.  Abdominal: Soft. Bowel sounds are normal. He exhibits no distension. There is no tenderness. There is no rebound.  Genitourinary:  No palpable hernia. Normal cremasteric reflex. Normal testicle.   Musculoskeletal: Normal range of motion. He exhibits tenderness.  Midline low lumbar tenderness.   Neurological: He is alert and oriented to person, place, and time. Tremors: .neuro.  Normal symmetric Strength to shoulder shrug, triceps, biceps, grip,wrist flex/extend,and intrinsics  Norma lsymmetric sensation above and below clavicles, and to all distributions to UEs. Norma symmetric strength to flex/.extend hip and knees, dorsi/plantar flex ankles. Normal symmetric sensation to all distributions to LEs Patellar and achilles reflexes 1-2+. Downgoing Babinski   Skin: Skin is warm and dry. No rash noted.  Psychiatric: He has a normal mood and affect. His behavior is normal.  Nursing note and vitals reviewed.   ED Course  Procedures   DIAGNOSTIC STUDIES: Oxygen Saturation is 99% on RA, normal by my interpretation.    COORDINATION OF CARE: 12:43 PM Discussed treatment plan with pt at bedside and pt agreed to plan.   Labs Review Labs Reviewed  URINALYSIS, ROUTINE W REFLEX MICROSCOPIC - Abnormal; Notable for the following:    APPearance HAZY (*)    Glucose, UA 250 (*)    All other components within normal limits    Imaging Review Dg Abd Acute W/chest  03/20/2015   CLINICAL DATA:  Abdominal pain.  Lower back pain and stomach cramps.   EXAM: DG ABDOMEN ACUTE W/ 1V CHEST  COMPARISON:  01/15/2015 and 03/07/2012 and chest CT 03/07/2012  FINDINGS: Again noted is a poorly defined nodular density in the right lower chest which is unchanged since 2013 and probably represents overlying shadows. Lung markings are mildly prominent but unchanged. Stable appearance of the heart and mediastinum. The trachea is midline. There is no evidence for free air. Small amount of bowel gas throughout the abdomen with a non obstructive pattern. No large abdominal calcifications. Small calcifications in pelvis likely represent phleboliths.  IMPRESSION: Negative abdominal radiographs.  No acute cardiopulmonary disease.   Electronically Signed   By: Richarda Overlie M.D.   On: 03/20/2015 13:38     EKG Interpretation None      MDM   Final diagnoses:  AP (abdominal pain)  Left-sided low back pain without sciatica    No hematuria. No maladies on plain film x-rays. Pain consistent with skeletal skeletal pain. Plan is discharge home. He did discuss with me his concerns about another prescriber giving him pain medication. He states he has a agreement with Dr. looking. He was very upfront about this. I've asked him to contact Dr. looking and inform him that I have given him an additional prescription. Thus I'm documenting electronically in his chart as well.  I personally performed the services described in this documentation, which was scribed in my presence. The recorded information has been reviewed and is accurate.      Rolland Porter, MD 03/20/15 [phone]

## 2015-03-27 ENCOUNTER — Telehealth: Payer: Self-pay | Admitting: Family Medicine

## 2015-03-27 ENCOUNTER — Telehealth: Payer: Self-pay | Admitting: Orthopedic Surgery

## 2015-03-27 NOTE — Telephone Encounter (Signed)
[facility] has the script for the 10/325 they did not fill until they heard from you that it is okay. Has been filling 5/325. Last filled 03/03/15. Pt is out because he has been doubling up on the 5/325. Pt would like to fill the 10/325 that the ed dr wrote for. Is this okay.

## 2015-03-27 NOTE — Telephone Encounter (Signed)
When did we last rx and when did he last fill ours? When is next one from us due?

## 2015-03-27 NOTE — Telephone Encounter (Signed)
That's cute, pt did not or could not get his stronger filled via the ER, so he just went up on mine.  This is a total "gotcha" as far as my input.  Numb one: I did not approve of him taking higher dose of meds,  Number two: It was pharmacy's choice not to fill other  Number three: Even if I approved the med, pharm would complain because I am filling it early at the same stated dose of four times per day  Numberr four: Pt a week after thisacute  injury should not exceed his usual chronic dose anyway  So the answer is no, fill mine when I wrote it for,

## 2015-03-27 NOTE — Telephone Encounter (Signed)
Call received from patient; states had been seen at [facility] 03/20/15 for injury that occurred while moving a sofa; said his back is really hurting more now; inquiring about immediate appointment.  Per Emergency room notes, and per patient, no Xrays had been done of his back.  He also relates that he is scheduled with primary care, Dr Gerda DissLuking, this Friday.  Patient also mentioned that he had been unable to fill the prescription from the Emergency Room doctor, due to current pain medication being prescribed by Dr Gerda DissLuking.  Patient also states he had arthroscopic knee surgery by Dr Romeo AppleHarrison 04/22/14. Relayed that we can schedule, however, no immediate appointment available at this time.  Patient elects to try to have Xray of back since not done initially at [facility].

## 2015-03-27 NOTE — Telephone Encounter (Signed)
Discussed with pt. [name] at [facility] to void the script from the ED.

## 2015-03-27 NOTE — Telephone Encounter (Signed)
Patient said that [place] and he was seen at the ER for this on 03/20/2015.  He said that he used his oxycodone prescribed by Dr. Brett CanalesSteve for the pain and says that he is out of this medication now. Can he get a refill?

## 2015-03-27 NOTE — Telephone Encounter (Signed)
Oxy 10/325 #30 one q 4 given in the ed.

## 2015-03-28 NOTE — Telephone Encounter (Signed)
No response back from patient to follow up call.

## 2015-03-30 ENCOUNTER — Telehealth: Payer: Self-pay | Admitting: *Deleted

## 2015-03-30 NOTE — Telephone Encounter (Signed)
[facility]. PA request submitted via covermymeds. Key: [id number]

## 2015-03-31 ENCOUNTER — Ambulatory Visit (INDEPENDENT_AMBULATORY_CARE_PROVIDER_SITE_OTHER): Payer: BLUE CROSS/BLUE SHIELD | Admitting: Family Medicine

## 2015-03-31 ENCOUNTER — Encounter: Payer: Self-pay | Admitting: Family Medicine

## 2015-03-31 ENCOUNTER — Other Ambulatory Visit: Payer: Self-pay | Admitting: Internal Medicine

## 2015-03-31 VITALS — BP 120/80 | Ht 70.0 in | Wt 285.0 lb

## 2015-03-31 DIAGNOSIS — M545 Low back pain, unspecified: Secondary | ICD-10-CM

## 2015-03-31 MED ORDER — DIAZEPAM 5 MG PO TABS: 5.0000 mg | ORAL_TABLET | Freq: Three times a day (TID) | ORAL | Status: DC | PRN

## 2015-03-31 NOTE — Telephone Encounter (Signed)
There is [facility]. Per your last note in the OV from 03-08-15 you stated, "I see no evidence that the dulera is helping you and would stop it at this point pending an asthma challenge test".  Patient cancelled his methacholine challenge on 03-20-15. May we refill dulera?
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
## 2015-04-03 ENCOUNTER — Ambulatory Visit (HOSPITAL_COMMUNITY)
Admission: RE | Admit: 2015-04-03 | Discharge: 2015-04-03 | Disposition: A | Discharge status: Home / Self Care | Payer: BLUE CROSS/BLUE SHIELD | Source: Ambulatory Visit | Attending: Family Medicine | Admitting: Family Medicine

## 2015-04-03 DIAGNOSIS — M5136 Other intervertebral disc degeneration, lumbar region: Secondary | ICD-10-CM | POA: Diagnosis not present

## 2015-04-03 DIAGNOSIS — M545 Low back pain: Secondary | ICD-10-CM | POA: Insufficient documentation

## 2015-04-04 ENCOUNTER — Other Ambulatory Visit: Payer: Self-pay

## 2015-04-04 MED ORDER — ALPRAZOLAM 1 MG PO TABS: ORAL_TABLET | ORAL | Status: DC

## 2015-04-05 NOTE — Telephone Encounter (Signed)
lmtcb x1 

## 2015-04-05 NOTE — Telephone Encounter (Signed)
Called to check the status of Pa on Dulera with [employer]. Pt Id: [id number] is inactive. Called [facility] and spoke to [place]. She says Id [id number] is what they have and it does not require PA but cost is $200. Patient refused to buy. What would you like to do?

## 2015-04-05 NOTE — Telephone Encounter (Signed)
symbicort 160 is same name and has a 25 dollar guarantee and that's all we can do - can pick up samples when comes in for coupon

## 2015-04-13 ENCOUNTER — Ambulatory Visit: Payer: BLUE CROSS/BLUE SHIELD | Attending: Pulmonary Disease | Admitting: Sleep Medicine

## 2015-04-13 DIAGNOSIS — G4733 Obstructive sleep apnea (adult) (pediatric): Secondary | ICD-10-CM | POA: Insufficient documentation

## 2015-04-13 NOTE — Telephone Encounter (Signed)
Called patient to f/u on message left 04/05/15. Pt states he has tried Symbicort in the past and his breathing got worse. He will fill out patient assistance paperwork and mail to company. I told patient to call office to schedule f/u in July but certainly call us if he needed. Nothing further needed.

## 2015-04-17 NOTE — Addendum Note (Signed)
Addended by: Clemens CatholicBARKSDALE, Mckale Haffey L on: 04/17/2015 11:28 AM   Modules accepted: Level of Service

## 2015-04-18 DIAGNOSIS — G4730 Sleep apnea, unspecified: Secondary | ICD-10-CM | POA: Diagnosis not present

## 2015-04-18 NOTE — Sleep Study (Signed)
[facility]  NAME: Joseph [name]  DATE OF BIRTH: 1971-02-26  MEDICAL RECORD NUMBER [id number]  LOCATION: [facility]  PHYSICIAN: Madelaine Whipple [name].  DATE OF STUDY: 04/13/15   SLEEP STUDY TYPE: CPAP titration study               REFERRING PHYSICIAN: Oretha MilchAlva, Joseph Mcpartlin V, MD  INDICATION FOR STUDY: 44 year old with chronic cough and moderate OSA. PSG in 10/2011 showed AHI of 19 per hour with lowest desaturation of 86%. He is maintained on CPAP at 9 cm. At the time of this study ,they weighed 280 pounds with a height of  5 ft 10 inches and the BMI of 40, neck size of 19 inches. Epworth sleepiness score was 3   This CPAP titration polysomnogram was performed with a sleep technologist in attendance. EEG, EOG,EMG and respiratory parameters recorded. Sleep stages, arousals, limb movements and respiratory data was scored according to criteria laid out by the American Academy of sleep medicine.  SLEEP ARCHITECTURE: Lights out was at 2116 PM and lights on was at 440 AM. Total sleep time was 278 minutes with sleep period time of 383 minutes and sleep efficiency of 63% .Sleep latency was 61 minutes with latency to REM sleep of 112 minutes and wake after sleep onset of 105 minutes.  Sleep stages as a percentage of total sleep time was N1 6% N2- 84 % and REM sleep 10 % ( 27 minutes) . The longest period of REM sleep was around 12:30 AM.   AROUSAL DATA : There were 22 arousals with an arousal index of 5 events per hour. Of these 22 were spontaneous, and 0 were associated with respiratory events and 0 were associated periodic limb movements  RESPIRATORY DATA: CPAP was initiated at 6 centimeters and titrated to a final level of 11 centimeters due to respiratory events and snoring. At the final level of 10 centimeters, there were 0 obstructive apneas, 0 central apneas, 0 mixed apneas and 0 hypopneas with apnea -hypopnea index of 0 events per hour.  There was no relation to sleep  stage or body position. Titration was optimal.  MOVEMENT/PARASOMNIA: There were 0 PLMS with a PLM index of 0 events per hour. The PLM arousal index was 0 events per hour.  OXYGEN DATA: The lowest desaturation was 89 % during non-REM sleep and the desaturation index was 1.5 per hour. The saturations stayed below 88% for 0 minutes.  CARDIAC DATA: The low heart rate was 38 beats per minute. The high heart rate recorded was an artifact. No arrhythmias were noted   IMPRESSION :  1. Moderate obstructive sleep apnea with hypopneas causing sleep fragmentation and mild oxygen desaturation. 2. This was corrected by CPAP of 10 centimeters with a medium fullface mask. Titration was optimal. Mild low saturations persisted. 3. No evidence of cardiac arrhythmias or behavioral disturbance during sleep. 4. Periodic limb movements were not noted.  RECOMMENDATION:    1. The treatment options for this degree of sleep disordered breathing includes weight loss and/or CPAP therapy. CPAP can be initiated at 10 centimeters with a medium fullface mask and compliance monitored at this level. 2. Patient should be cautioned against driving when sleepy 3. They should be asked to avoid medications with sedative side effects  Oretha MilchALVA,Shantoria Ellwood [name].  MD Diplomate, American Board of Sleep Medicine  ELECTRONICALLY SIGNED ON:  04/18/2015   [facility] PH: [phone]   FX: [phone] ACCREDITED BY THE AMERICAN ACADEMY  OF SLEEP MEDICINE

## 2015-05-05 ENCOUNTER — Telehealth: Payer: Self-pay | Admitting: Internal Medicine

## 2015-05-05 ENCOUNTER — Telehealth: Payer: Self-pay | Admitting: Pulmonary Disease

## 2015-05-05 DIAGNOSIS — G4733 Obstructive sleep apnea (adult) (pediatric): Secondary | ICD-10-CM

## 2015-05-05 NOTE — Telephone Encounter (Signed)
Spoke with pt. He is requesting results of his sleep study. Advised him that I would get this message to RA to address his study.  RA - please advise. Thanks.

## 2015-05-05 NOTE — Telephone Encounter (Signed)
Samples have been left at the front desk. Pt is aware. Nothing further was needed. 

## 2015-05-05 NOTE — Telephone Encounter (Signed)
04/2015 OSA corrected by CPAP 10 cm Pl ask DME to change to CPAP 10 Download & FU with TP or me in 1 month

## 2015-05-08 NOTE — Telephone Encounter (Signed)
Spoke with pt.  Discussed below per RA.  Pt verbalized understanding and is aware DME will contact him to set cpap pressure at 10 cm.  Pt aware to call office if problems with this setting.   Pt states he also needs cpap supplies.  Order placed.  Pt aware. 1 month FU scheduled with TP on June 09, 2015 at 2:15 pm.    Pt confirmed appt, is in agreement with plan, and voiced no further questions or concerns at this time.

## 2015-05-23 ENCOUNTER — Encounter: Payer: Self-pay | Admitting: Family Medicine

## 2015-05-23 ENCOUNTER — Ambulatory Visit (INDEPENDENT_AMBULATORY_CARE_PROVIDER_SITE_OTHER): Payer: BLUE CROSS/BLUE SHIELD | Admitting: Family Medicine

## 2015-05-23 VITALS — BP 126/88 | Temp 98.5°F | Ht 70.0 in | Wt 273.0 lb

## 2015-05-23 DIAGNOSIS — R21 Rash and other nonspecific skin eruption: Secondary | ICD-10-CM | POA: Diagnosis not present

## 2015-05-23 DIAGNOSIS — E119 Type 2 diabetes mellitus without complications: Secondary | ICD-10-CM | POA: Diagnosis not present

## 2015-05-23 DIAGNOSIS — Z7189 Other specified counseling: Secondary | ICD-10-CM

## 2015-05-23 DIAGNOSIS — Z79899 Other long term (current) drug therapy: Secondary | ICD-10-CM

## 2015-05-23 DIAGNOSIS — R5383 Other fatigue: Secondary | ICD-10-CM

## 2015-05-23 DIAGNOSIS — G8929 Other chronic pain: Secondary | ICD-10-CM

## 2015-05-23 DIAGNOSIS — E785 Hyperlipidemia, unspecified: Secondary | ICD-10-CM

## 2015-05-23 MED ORDER — KETOCONAZOLE 2 % EX CREA: 1.0000 "application " | TOPICAL_CREAM | Freq: Two times a day (BID) | CUTANEOUS | Status: DC

## 2015-05-23 MED ORDER — OXYCODONE-ACETAMINOPHEN 5-325 MG PO TABS: 1.0000 | ORAL_TABLET | Freq: Four times a day (QID) | ORAL | Status: DC | PRN

## 2015-05-23 MED ORDER — FLUCONAZOLE 200 MG PO TABS: 200.0000 mg | ORAL_TABLET | ORAL | Status: DC

## 2015-05-23 NOTE — Progress Notes (Signed)
   Subjective:    Patient ID: Joseph [name], male    DOB: 04-Aug-1971, 44 y.o.   MRN: [id number] Patient arrives office supposedly for a rash but in sup having numerous other concerns  States that it is time for his pain medication. States compliant with it. States she definitely needs to continue to function. No obvious side effects.   Rash This is a new problem. The current episode started more than 1 month ago. The problem is unchanged. The affected locations include the left axilla. The rash is characterized by redness. He was exposed to nothing. Past treatments include nothing. The treatment provided no relief.   Patient has been experiencing a lot of fatigue and wants to have some blood work done. Has been running tired lately, not sure if the cpap is right, working on new settins  ras   Review of Systems  Skin: Positive for rash.   no headache no chest pain chronic back pain no change in bowel habits no blood in stool     Objective:   Physical Exam  Alert vitals stable HEENT normal lungs clear heart regular rhythm low back tender to percussion negative CVA tenderness. Left chest wall maxillary region tenia versicolor evident      Assessment & Plan:  Impression 1 tenia versicolor #2 fatigue nonspecific time for some manual blood work anyway. #3 chronic pain meds. Urine drug screen done today. New pain contract filled out discussed at length plan as noted above follow-up as scheduled. Fluconazole and ketoconazole for the rash

## 2015-05-27 LAB — TOXASSURE SELECT 13 (MW), URINE: PDF: 0

## 2015-06-09 ENCOUNTER — Ambulatory Visit: Payer: Medicare Other | Admitting: Adult Health

## 2015-06-12 ENCOUNTER — Other Ambulatory Visit: Payer: Self-pay | Admitting: Family Medicine

## 2015-06-27 ENCOUNTER — Ambulatory Visit: Payer: Medicare Other | Admitting: Adult Health

## 2015-07-07 ENCOUNTER — Other Ambulatory Visit: Payer: Self-pay | Admitting: Family Medicine

## 2015-07-07 NOTE — Telephone Encounter (Signed)
Ok 11 ref 

## 2015-07-13 ENCOUNTER — Other Ambulatory Visit: Payer: Self-pay | Admitting: Family Medicine

## 2015-07-20 ENCOUNTER — Other Ambulatory Visit: Payer: Self-pay | Admitting: Family Medicine

## 2015-08-21 ENCOUNTER — Telehealth: Payer: Self-pay | Admitting: Internal Medicine

## 2015-08-21 NOTE — Telephone Encounter (Signed)
Pt advised that samples were left at front desk for pick up.  F/U appt scheduled with Dr Sherene Sires.

## 2015-08-22 ENCOUNTER — Ambulatory Visit (INDEPENDENT_AMBULATORY_CARE_PROVIDER_SITE_OTHER): Payer: BLUE CROSS/BLUE SHIELD | Admitting: Family Medicine

## 2015-08-22 ENCOUNTER — Encounter: Payer: Self-pay | Admitting: Family Medicine

## 2015-08-22 VITALS — BP 128/82 | Ht 70.0 in | Wt 257.2 lb

## 2015-08-22 DIAGNOSIS — Z23 Encounter for immunization: Secondary | ICD-10-CM | POA: Diagnosis not present

## 2015-08-22 DIAGNOSIS — E119 Type 2 diabetes mellitus without complications: Secondary | ICD-10-CM

## 2015-08-22 DIAGNOSIS — J431 Panlobular emphysema: Secondary | ICD-10-CM | POA: Diagnosis not present

## 2015-08-22 DIAGNOSIS — G894 Chronic pain syndrome: Secondary | ICD-10-CM | POA: Diagnosis not present

## 2015-08-22 LAB — POCT GLYCOSYLATED HEMOGLOBIN (HGB A1C): Hemoglobin A1C: 6.6

## 2015-08-22 MED ORDER — SILDENAFIL CITRATE 20 MG PO TABS
20.0000 mg | ORAL_TABLET | Freq: Three times a day (TID) | ORAL | Status: DC
Start: 2015-08-22 — End: 2015-08-22

## 2015-08-22 MED ORDER — OXYCODONE-ACETAMINOPHEN 5-325 MG PO TABS: 1.0000 | ORAL_TABLET | Freq: Four times a day (QID) | ORAL | Status: DC | PRN

## 2015-08-22 MED ORDER — SILDENAFIL CITRATE 20 MG PO TABS: ORAL_TABLET | ORAL | Status: DC

## 2015-08-22 NOTE — Progress Notes (Signed)
   Subjective:    Patient ID: Joseph [name], male    DOB: 1971/09/06, 44 y.o.   MRN: [id number]  HPI  This patient was seen today for chronic pain  The medication list was reviewed and updated.   -Compliance with pain medication: yes- oxycodone 5/325 #120 a month  The patient was advised the importance of maintaining medication and not using illegal substances with these.  Refills needed: yes  The patient was educated that we can provide 3 monthly scripts for their medication, it is their responsibility to follow the instructions.  Side effects or complications from medications:none  Patient is aware that pain medications are meant to minimize the severity of the pain to allow their pain levels to improve to allow for better function. They are aware of that pain medications cannot totally remove their pain.  Due for UDT ( at least once per year) : 05/2015  Results for orders placed or performed in visit on 08/22/15  POCT glycosylated hemoglobin (Hb A1C)  Result Value Ref Range   Hemoglobin A1C 6.6      Patient claims compliance with diabetic diet. Watching sugar intake. Compliant with diabetes medicines no low sugar episode.  Patient notes asthma overall stable only occasional use of albuterol. Medications reviewed. Unfortunately still smoking  Notes considerable stress. Has separated from wife. On Celexa 40 handling well   Review of Systems No headache positive back pain positive knee pain no chest pain no shortness of breath complete ROS otherwise negative    Objective:   Physical Exam Alert vitals stable blood pressure good on repeat HEENT normal. Lungs diminished breath sounds rare wheeze heart rare rhythm ankles no edema low back pain       Assessment & Plan:  Impression 1 chronic back pain discussed need for medicines discussed #2 COPD with asthma element once again encouraged strongly to stop smoking medications reviewed #3 type 2 diabetes. Control good. Meds  discussed maintain same plan pain medicines refilled. Diet exercise discussed. Recheck in several months. WSL

## 2015-08-29 ENCOUNTER — Ambulatory Visit: Payer: Medicare Other | Admitting: Internal Medicine

## 2015-09-11 ENCOUNTER — Ambulatory Visit: Payer: Medicare Other | Admitting: Internal Medicine

## 2015-10-03 ENCOUNTER — Ambulatory Visit (INDEPENDENT_AMBULATORY_CARE_PROVIDER_SITE_OTHER): Payer: Medicare Other | Admitting: Internal Medicine

## 2015-10-03 ENCOUNTER — Encounter: Payer: Self-pay | Admitting: Internal Medicine

## 2015-10-03 VITALS — BP 112/84 | HR 83 | Ht 70.0 in | Wt 266.4 lb

## 2015-10-03 DIAGNOSIS — J45909 Unspecified asthma, uncomplicated: Secondary | ICD-10-CM | POA: Diagnosis not present

## 2015-10-03 DIAGNOSIS — F17210 Nicotine dependence, cigarettes, uncomplicated: Secondary | ICD-10-CM | POA: Diagnosis not present

## 2015-10-03 DIAGNOSIS — Z720 Tobacco use: Secondary | ICD-10-CM

## 2015-10-03 DIAGNOSIS — F17200 Nicotine dependence, unspecified, uncomplicated: Secondary | ICD-10-CM

## 2015-10-03 NOTE — Progress Notes (Signed)
Subjective:   Patient ID: Joseph [name], male    DOB: 04/09/1971   MRN: [id number]    Brief patient profile:  44yowm active smoker without significant airflow obst by PFTs 06/02/12 and 03/08/2015    History of Present Illness  IOV 04/10/12   In April 2013 cc  had pneumonia apparently. Went to [facility] ER and found some abnormality (nodule based on review of ER notes) on CXR that resulted in CT Angio 03/07/12 same visit. PE ruled out but found to have mediastinal nodes but the cxr nodule was not there. Apparently ER doctor said it was lymphoma and asked to see an oncologist. But primary care doctor felt that was over zealous and referred patient here. He prefers conservative mgmt to nodes if clinical suspicion for cancer is low  cc lack of energy (spent most of days yesterday in bed), esp in pollen, chronic dyspnea on exertion for 180 feet relieved by rest  But slowly progressive since 2002 for which he uses albuterol prn on average 4 times a day.  Walkt test 185 feet x 3 laps: no desaturation  He smokes at baseline; trying quit, prior intolerance (dreams) with chantix.   Only baseline mild chronic cough with associtaed wheeze that is occasional.. Does have some B symptoms like nocturnal diaphoresis (soaks pillows). Denies weight loss.   CT ANGIOGRAPHY CHEST  Comparison: 03/07/2012  An abnormal right lower paratracheal node has a short axis diameter  of 1.6 cm. An abnormal AP window lymph node has a short axis  diameter of 1.4 cm. Mild bilateral hilar and infrahilar adenopathy  noted. Scattered small axillary lymph nodes are present.  A subcarinal node has a short axis diameter of 1.5 cm.     OV 08/14/2012 Mediastinal nodes: PET scan 06/10/12 shows very low uptake in the mediastinal nodes. Suspicion for cancer is very low. Lymph nodes include 13 mm right lower paratracheal lymph node and 10 mm prevascular lymph node. ACE level 04/10/12  - is in 20s and normal  Still dyspneic: 1 flught of  steps and better with rest and with albuterol and atrovent prn. Dyspne also worse in winter and cooler weathers. Rates it as moderate. STable since last visit to slightly worse. thre is some associtaed cough. No asociated weight loss. COugh is moderate and worst early in morning and in cooler temperatues.. Denies sinus drainage. PFts show mixed picture - fvc 3.4L/69%, fev1 2.9/76%, Ratio 85 but 12% bd respose. TLC 5.7/83%. DLCO 23.7/69% rec rec #MEdiastinal node  - suspicion for cancer is low  - you will need CT chest in a year; we will schedule it later  #Smoking  - glad you are working on quitting  #Shortness of breath - this is due to asthma/copd and weight  - please start QVAR 2 puff twice daily - take sample and show technique and take prescription too  - focus on weight loss through the low glycemic diet; take diet sheet from [place]  #FOllowup  - 3 months with spirometry at followup  - flu shot and pneumovax through PMD as discussed   12/17/2013  acute ov/Joseph [name] still active smoker re: recurrent pattern of coughing x a decade intermittent / out of control since nov 2014/ can't take symbicort makes it worse / on percocet one tid at baseline  Chief Complaint  Patient presents with  . Acute Visit    MR pt.  C/o mostly nonprod cough with some thick white mucous, coughing until he almost passes out.  [place]ent to [employer] [facility] last week.    duoneb daily  Sometime skips the doses later in the day but always uses the am dose, sputum to purulent. Mostly sob with cough/ otherwise breaths ok Last neb was 4 h prior to OV   rec For Cough - use the flutter valve as much as you can plus mucinex dm 1200 mg every 12 hours as needed supplement with percocet up to 2 every 4hours For Breathing  Only use your duoneb  rescue medication . - Ok to use up to    every 4 hours if you must  Prednisone 10 mg take  4 each am x 2 days,   2 each am x 2 days,  1 each am x 2 days and stop  Pantoprazole (protonix) 40 mg    Take 30-60 min before first meal of the day and Pepcid 20 mg one bedtime until return to office - this is the best way to tell whether stomach acid is contributing to your problem.  GERD  Diet . Please schedule a follow up office visit in 2 weeks, sooner if needed with all meds in hand  07/27/2014 f/u ov/Joseph [name] re: asthma / still smoking  Chief Complaint  Patient presents with  . Follow-up    Pt c/o increased SOB and cough for the past month- cough is occ prod with minimal clear sputum. Pt currently taking clindamycin for staph infection. Rarely using albuterol inhaler, but is using neb approx 4 times per day.   off dulera x 1.5 week denies more sob/ cough or need for rescue on vs off rec Plan A=  dulera 200 Take 2 puffs first thing in am and then another 2 puffs about 12 hours later (or formulary alternative- call us if you find a cheaper one)  Plan B =  Backup = proaire  Plan C = Backup plan B = nebulized albuterol up to 4 hours but call if needing this much at all The key is to stop smoking completely before smoking completely stops you!    11/29/2014 f/u ov/Joseph [name] re: s/p "aecopd" still smoking  Chief Complaint  Patient presents with  . Follow-up    Pt states that his SOB and cough have been worse for the past several wks. He went to ED for COPD exacerbation on 11/14/14.  He is using proair 3-4 times per day and neb about 3 times per day on average.   one month prior to flare needing proair 1-2 per day and neb at least once or twice also - using neb first thing instead of dulera  Better p pred from er then worse again w/in a week of taper off assoc with congested cough but mostly white mucus esp in ams rec Think of your respiratory medications as multiple steps you can take to control your symptoms and avoid having to go to the ER Plan A is your maintenance daily no matter what meds: dulera Take 2 puffs first thing in am and then another 2 puffs about 12 hours later.  Plan B only use after  you've used your maintenance (Plan A) medication, and only if you can't catch your breath: Proair 2 every 4 hours as needed  Plan C only use after you've used plan A and B and still can't catch your breath: nebulizer albuterol 2.5 mg up to every 4 hours  Plan D(for Doctor):  If you've used A thru C and not doing a lot better or still needing C more  than a once a day,  D = call the doctor for evaluation asap Plan E (for ER):  If still not able to catch your breath, even after using your nebulizer up to every 4 hours, go to ER  For cough > delsym 2 tsp every 12 hours is the best you can buy Prevacid 30 mg Take 30-60 min before first meal of the day  GERD diet      03/08/2015 f/u ov/Joseph [name] re: still smoking / off dulera x 48 h and off saba hfa/ using neb 8 am of test /no change in symptoms or need for saba on or off dulera  Chief Complaint  Patient presents with  . Follow-up    PFT done today. Pt states that his breathing is about the same since last visit. He is using albuterol inhaler approx 3 x per day.   breathing does better if stays in house,  worse out in cold Even on dulera still using neb at least twice daily  Some sporadic dry fits of  cough with bending over even on dulera but not using ppi correctly  rec Prevacid 30 mg should be to Take 30- 60 min before your first and last meals of the day  I see no evidence that the dulera is helping you and would stop it at this point pending an asthma challenge test  Please see patient coordinator before you leave today  to schedule Methacholine test - do not take any albuterol with in 6 hours of the test> never done     10/03/2015  f/u ov/Joseph [name] re: still smoking /  ? asthma vs all vcd/ gerd  Chief Complaint  Patient presents with  . Follow-up    Pt states his breathing is overall doing well. He is using rescue inhaler and neb several times per day.    Wife left him in July 2016 and losing wt, dm better, breathing not since stopped dulera and  never went for mct and can't afford it  No noct symptoms after hs saba/ cpap  saba use up when no access to dulera 200 but even on it he continues to perceive need for saba daily despite nl exams/ pfts    No obvious day to day or daytime variabilty or excess/ purulent sputum  or assoc cp or  subjective wheeze overt sinus or hb symptoms. No unusual exp hx or h/o childhood pna/ asthma or knowledge of premature birth.  Sleeping ok without nocturnal  or early am exacerbation  of respiratory  c/o's or need for noct saba. Also denies any obvious fluctuation of symptoms with weather or environmental changes or other aggravating or alleviating factors except as outlined above   Current Medications, Allergies, Complete Past Medical History, Past Surgical History, Family History, and Social History were reviewed in [employer] record.  ROS  The following are not active complaints unless bolded sore throat, dysphagia, dental problems, itching, sneezing,  nasal congestion or excess/ purulent secretions, ear ache,   fever, chills, sweats, unintended wt loss, pleuritic or exertional cp, hemoptysis,  orthopnea pnd or leg swelling, presyncope, palpitations, heartburn, abdominal pain, anorexia, nausea, vomiting, diarrhea  or change in bowel or urinary habits, change in stools or urine, dysuria,hematuria,  rash, arthralgias, visual complaints, headache, numbness weakness or ataxia or problems with walking or coordination,  change in mood/affect or memory.              Objective:   Physical Exam  amb obese wm  nad  / vital signs reviewed  12/31/2013       294  > 294 01/14/2014 > 02/28/2014  292 >  07/27/2014  286 > 11/29/2014   289> 03/08/2015  284 > 10/03/2015 266      HEENT: nl dentition, turbinates, and orophanx. Nl external ear canals without cough reflex   NECK :  without JVD/Nodes/TM/ nl carotid upstrokes bilaterally   LUNGS: no acc muscle use, clear to A and P bilaterally with     CV:  RRR  no s3 or murmur or increase in P2, no edema   ABD:  soft and nontender with nl excursion in the supine position. No bruits or organomegaly, bowel sounds nl  MS:  warm without deformities, calf tenderness, cyanosis or clubbing  SKIN: warm and dry without lesions    NEURO:  alert, approp, no deficits          11/14/14 No active cardiopulmonary disease.            Assessment & Plan:

## 2015-10-03 NOTE — Patient Instructions (Signed)
Please see patient coordinator before you leave today  to schedule methacholine challenge test  Ok to resume dulera but stop it 24 hours before the methacholine and no albuterol w/in 6 hours   Please schedule a follow up office visit in 4 weeks, sooner if needed

## 2015-10-05 ENCOUNTER — Ambulatory Visit (INDEPENDENT_AMBULATORY_CARE_PROVIDER_SITE_OTHER): Payer: BLUE CROSS/BLUE SHIELD | Admitting: Family Medicine

## 2015-10-05 ENCOUNTER — Encounter: Payer: Self-pay | Admitting: Family Medicine

## 2015-10-05 VITALS — BP 122/88 | Ht 70.0 in | Wt 267.1 lb

## 2015-10-05 DIAGNOSIS — F329 Major depressive disorder, single episode, unspecified: Secondary | ICD-10-CM | POA: Diagnosis not present

## 2015-10-05 DIAGNOSIS — F32A Depression, unspecified: Secondary | ICD-10-CM

## 2015-10-05 MED ORDER — BUPROPION HCL ER (SR) 150 MG PO TB12: ORAL_TABLET | ORAL | Status: DC

## 2015-10-05 NOTE — Progress Notes (Signed)
   Subjective:    Patient ID: Joseph [name], male    DOB: 1971/10/01, 44 y.o.   MRN: [id number]  HPI Patient is here today to discuss changing his celexa to a different medication. Patient states that the celexa is not working to help his depression symptoms anymore.  Patient has no concerns at this time.   Pt has appt with psychiatrist in [place]  celexa not helping at ths time  Since separatio with wife and now less contact with daughter has worsening depr  Real good connection with no  No suic thoughts  No homicidal thoughts. Patient has gone and scheduled visit with psychiatrist but cannot see for month. Wonders if he can take something else        Review of Systems No chest pain no new back pain no abdominal pain ROS otherwise negative    Objective:   Physical Exam Alert vitals stable HEENT normal. Lungs clear heart regular in rhythm affect appropriate oriented 3  impression depression with worsening exogenous factors noted discussed       Assessment & Plan:  See above  Plan add Wellbutrin. Rationale discussed maintain Celexa follow-up with psychiatrist as scheduled WSL

## 2015-10-06 ENCOUNTER — Encounter: Payer: Self-pay | Admitting: Internal Medicine

## 2015-10-06 DIAGNOSIS — E66811 Obesity, class 1: Secondary | ICD-10-CM | POA: Insufficient documentation

## 2015-10-06 DIAGNOSIS — E6609 Other obesity due to excess calories: Secondary | ICD-10-CM | POA: Insufficient documentation

## 2015-10-06 DIAGNOSIS — Z6833 Body mass index (BMI) 33.0-33.9, adult: Secondary | ICD-10-CM | POA: Insufficient documentation

## 2015-10-06 NOTE — Assessment & Plan Note (Signed)
-   PFT's s sign copd 06/2012 - med calendar 2/13 /15 > not using 11/29/14  - 11/29/2014 p extensive coaching HFA effectiveness =    90% from a baseline of 50%  -PFTs wnl 03/08/2015 4 h p alb neb> rec MCT at least 6 h p neb 03/08/2015 > did not do so rescheduled 10/03/2015   There has never been objective evidence that he has sign airflow obst so rec proceed with the definitive test since he can't afford the dulera he perceives he needs and if we prove it then will feel better about samples or trying [place]'s options with symbicort for free.  I had an extended discussion with the patient reviewing all relevant studies completed to date and  lasting 15 to 20 minutes of a 25 minute visit    Each maintenance medication was reviewed in detail including most importantly the difference between maintenance and prns and under what circumstances the prns are to be triggered using an action plan format that is not reflected in the computer generated alphabetically organized AVS.    Please see instructions for details which were reviewed in writing and the patient given a copy highlighting the part that I personally wrote and discussed at today's ov.

## 2015-10-06 NOTE — Assessment & Plan Note (Signed)
>   3 m  Discussed the risks and costs (both direct and indirect)  of smoking relative to the benefits of quitting but patient unwilling to commit at this point to a specific quit date.    Offered to help with quitting by use of e cigs or  referral to our [employer] program when the patient is ready.

## 2015-10-06 NOTE — Assessment & Plan Note (Signed)
Complicated by aodm/ osa /gerd   Body mass index is 38.22 says trending down some since wife left him but really no chang per our records  No results found for: TSH   Contributing to gerd tendency/ doe/reviewed the need and the process to achieve and maintain neg calorie balance > defer f/u primary care including intermittently monitoring thyroid status

## 2015-10-07 DIAGNOSIS — F329 Major depressive disorder, single episode, unspecified: Secondary | ICD-10-CM | POA: Insufficient documentation

## 2015-10-07 DIAGNOSIS — F32A Depression, unspecified: Secondary | ICD-10-CM | POA: Insufficient documentation

## 2015-10-07 DIAGNOSIS — F419 Anxiety disorder, unspecified: Secondary | ICD-10-CM | POA: Insufficient documentation

## 2015-10-10 ENCOUNTER — Ambulatory Visit (HOSPITAL_COMMUNITY)
Admission: RE | Admit: 2015-10-10 | Discharge: 2015-10-10 | Disposition: A | Discharge status: Home / Self Care | Payer: BLUE CROSS/BLUE SHIELD | Source: Ambulatory Visit | Attending: Internal Medicine | Admitting: Internal Medicine

## 2015-10-10 DIAGNOSIS — J45909 Unspecified asthma, uncomplicated: Secondary | ICD-10-CM | POA: Diagnosis present

## 2015-10-10 LAB — PULMONARY FUNCTION TEST
FEF 25-75 PRE: 3.63 L/s
FEF 25-75 Post: 5.1 L/sec
FEF2575-%CHANGE-POST: 40 %
FEF2575-%PRED-POST: 137 %
FEF2575-%Pred-Pre: 98 %
FEV1-%Change-Post: 10 %
FEV1-%Pred-Post: 73 %
FEV1-%Pred-Pre: 66 %
FEV1-Post: 3 L
FEV1-Pre: 2.71 L
FEV1FVC-%Change-Post: -2 %
FEV1FVC-%Pred-Pre: 108 %
FEV6-%Change-Post: 12 %
FEV6-%PRED-POST: 70 %
FEV6-%Pred-Pre: 63 %
FEV6-POST: 3.56 L
FEV6-PRE: 3.16 L
FEV6FVC-%PRED-POST: 103 %
FEV6FVC-%PRED-PRE: 103 %
FVC-%Change-Post: 12 %
FVC-%PRED-POST: 69 %
FVC-%PRED-PRE: 61 %
FVC-PRE: 3.16 L
FVC-Post: 3.57 L
Post FEV1/FVC ratio: 84 %
Post FEV6/FVC ratio: 100 %
Pre FEV1/FVC ratio: 86 %
Pre FEV6/FVC Ratio: 100 %

## 2015-10-10 MED ORDER — METHACHOLINE 4 MG/ML NEB SOLN: 2.0000 mL | Freq: Once | RESPIRATORY_TRACT | Status: DC

## 2015-10-10 MED ORDER — SODIUM CHLORIDE 0.9 % IN NEBU
3.0000 mL | INHALATION_SOLUTION | Freq: Once | RESPIRATORY_TRACT | Status: AC
  Administered 2015-10-10: 3 mL via RESPIRATORY_TRACT

## 2015-10-10 MED ORDER — METHACHOLINE 0.25 MG/ML NEB SOLN
2.0000 mL | Freq: Once | RESPIRATORY_TRACT | Status: AC
  Administered 2015-10-10: 0.5 mg via RESPIRATORY_TRACT

## 2015-10-10 MED ORDER — METHACHOLINE 0.0625 MG/ML NEB SOLN
2.0000 mL | Freq: Once | RESPIRATORY_TRACT | Status: AC
  Administered 2015-10-10: 0.125 mg via RESPIRATORY_TRACT

## 2015-10-10 MED ORDER — ALBUTEROL SULFATE (2.5 MG/3ML) 0.083% IN NEBU
2.5000 mg | INHALATION_SOLUTION | Freq: Once | RESPIRATORY_TRACT | Status: AC
  Administered 2015-10-10: 2.5 mg via RESPIRATORY_TRACT

## 2015-10-10 MED ORDER — METHACHOLINE 1 MG/ML NEB SOLN: 2.0000 mL | Freq: Once | RESPIRATORY_TRACT | Status: DC

## 2015-10-10 MED ORDER — METHACHOLINE 16 MG/ML NEB SOLN: 2.0000 mL | Freq: Once | RESPIRATORY_TRACT | Status: DC

## 2015-10-13 ENCOUNTER — Telehealth: Payer: Self-pay | Admitting: *Deleted

## 2015-10-13 NOTE — Telephone Encounter (Signed)
Patient called stating his right knee has popped yesterday and now he can hardly walk patient denied injury, I offered first available or to go to the ER and we will call back to schedule appt.

## 2015-10-14 ENCOUNTER — Emergency Department (HOSPITAL_COMMUNITY)
Admission: EM | Admit: 2015-10-14 | Discharge: 2015-10-14 | Disposition: A | Discharge status: Home / Self Care | Payer: BLUE CROSS/BLUE SHIELD | Attending: Emergency Medicine | Admitting: Emergency Medicine

## 2015-10-14 ENCOUNTER — Encounter (HOSPITAL_COMMUNITY): Payer: Self-pay | Admitting: Emergency Medicine

## 2015-10-14 ENCOUNTER — Emergency Department (HOSPITAL_COMMUNITY): Payer: BLUE CROSS/BLUE SHIELD

## 2015-10-14 DIAGNOSIS — Y998 Other external cause status: Secondary | ICD-10-CM | POA: Insufficient documentation

## 2015-10-14 DIAGNOSIS — Z720 Tobacco use: Secondary | ICD-10-CM | POA: Insufficient documentation

## 2015-10-14 DIAGNOSIS — Z7982 Long term (current) use of aspirin: Secondary | ICD-10-CM | POA: Diagnosis not present

## 2015-10-14 DIAGNOSIS — J449 Chronic obstructive pulmonary disease, unspecified: Secondary | ICD-10-CM | POA: Insufficient documentation

## 2015-10-14 DIAGNOSIS — X58XXXA Exposure to other specified factors, initial encounter: Secondary | ICD-10-CM | POA: Insufficient documentation

## 2015-10-14 DIAGNOSIS — G8929 Other chronic pain: Secondary | ICD-10-CM | POA: Insufficient documentation

## 2015-10-14 DIAGNOSIS — E119 Type 2 diabetes mellitus without complications: Secondary | ICD-10-CM | POA: Insufficient documentation

## 2015-10-14 DIAGNOSIS — Z79899 Other long term (current) drug therapy: Secondary | ICD-10-CM | POA: Insufficient documentation

## 2015-10-14 DIAGNOSIS — S8991XA Unspecified injury of right lower leg, initial encounter: Secondary | ICD-10-CM | POA: Diagnosis present

## 2015-10-14 DIAGNOSIS — K219 Gastro-esophageal reflux disease without esophagitis: Secondary | ICD-10-CM | POA: Diagnosis not present

## 2015-10-14 DIAGNOSIS — Y9301 Activity, walking, marching and hiking: Secondary | ICD-10-CM | POA: Insufficient documentation

## 2015-10-14 DIAGNOSIS — M1990 Unspecified osteoarthritis, unspecified site: Secondary | ICD-10-CM | POA: Insufficient documentation

## 2015-10-14 DIAGNOSIS — Y9289 Other specified places as the place of occurrence of the external cause: Secondary | ICD-10-CM | POA: Diagnosis not present

## 2015-10-14 DIAGNOSIS — M25461 Effusion, right knee: Secondary | ICD-10-CM | POA: Insufficient documentation

## 2015-10-14 DIAGNOSIS — Z8669 Personal history of other diseases of the nervous system and sense organs: Secondary | ICD-10-CM | POA: Insufficient documentation

## 2015-10-14 MED ORDER — BUPIVACAINE HCL (PF) 0.5 % IJ SOLN
10.0000 mL | Freq: Once | INTRAMUSCULAR | Status: AC
  Administered 2015-10-14: 10 mL
  Filled 2015-10-14: qty 30

## 2015-10-14 MED ORDER — TRAMADOL HCL 50 MG PO TABS: 50.0000 mg | ORAL_TABLET | Freq: Four times a day (QID) | ORAL | Status: DC | PRN

## 2015-10-14 NOTE — ED Provider Notes (Signed)
CSN: [id number]     Arrival date & time 10/14/15  1112 History   First MD Initiated Contact with Patient 10/14/15 1141     Chief Complaint  Patient presents with  . Knee Pain    right     (Consider location/radiation/quality/duration/timing/severity/associated sxs/prior Treatment) Patient is a 44 y.o. male presenting with knee pain.  Knee Pain Location:  Knee Time since incident:  4 days Injury: yes (was walking an heard his kne pop. had immedate pain and  delayed swelling)   Knee location:  R knee Pain details:    Quality:  Aching and pressure   Radiates to:  Does not radiate   Severity:  Moderate   Onset quality:  Gradual   Progression:  Worsening Chronicity:  New Dislocation: no   Prior injury to area:  No Relieved by:  Nothing Worsened by:  Activity, flexion, extension and bearing weight Ineffective treatments:  Acetaminophen, compression and NSAIDs Associated symptoms: decreased ROM, stiffness and swelling   Associated symptoms: no back pain, no fatigue, no fever, no itching, no muscle weakness, no neck pain, no numbness and no tingling   Risk factors: obesity     Past Medical History  Diagnosis Date  . Diabetes mellitus   . COPD (chronic obstructive pulmonary disease) (HCC)   . Sleep apnea   . Asthma   . Chronic pain of left knee   . Chronic back pain   . Shortness of breath   . GERD (gastroesophageal reflux disease)   . Arthritis    Past Surgical History  Procedure Laterality Date  . Carpal tunnel release Left   . Shoulder arthrocentesis Right   . Vasectomy    . Hernia repair Bilateral   . Knee arthroscopy with medial menisectomy Left 04/22/2014    Procedure: LEFT KNEE ARTHROSCOPY WITH MEDIAL MENISECTOMY;  Surgeon: Vickki Hearing, MD;  Location: [facility];  Service: Orthopedics;  Laterality: Left;   Family History  Problem Relation Age of Onset  . Emphysema Maternal Grandfather   . Emphysema Maternal Grandmother   . Clotting disorder Maternal  Grandmother    Social History  Substance Use Topics  . Smoking status: Current Every Day Smoker -- 1.50 packs/day for 20 years    Types: Cigarettes    Start date: 12/17/1993  . Smokeless tobacco: Current User  . Alcohol Use: No    Review of Systems  Constitutional: Negative for fever and fatigue.  Musculoskeletal: Positive for joint swelling and stiffness. Negative for back pain and neck pain.  Skin: Negative for itching.  All other systems reviewed and are negative.     Allergies  Azithromycin; Erythromycin; Keflex; Metformin; Symbicort; and Doxycycline  Home Medications   Prior to Admission medications   Medication Sig Start Date End Date Taking? Authorizing Provider  ACAI PO Take 1 tablet by mouth daily.   Yes Historical Provider, MD  albuterol (PROAIR HFA) 108 (90 BASE) MCG/ACT inhaler 2 puffs every 4 hours if needed 03/08/15  Yes Nyoka Cowden, MD  albuterol (PROVENTIL) (2.5 MG/3ML) 0.083% nebulizer solution USE ONE VIAL IN NEBULIZER FOUR TIMES DAILY Patient taking differently: USE ONE VIAL IN NEBULIZER FOUR TIMES DAILY as needed 09/12/14  Yes Merlyn Albert, MD  ALPRAZolam [name]) 1 MG tablet TAKE ONE TABLET BY MOUTH TWICE A DAY AS NEEDED FOR ANXIETY 04/04/15  Yes Merlyn Albert, MD  ANDROGEL PUMP 20.25 MG/ACT (1.62%) GEL USE 2 PUMPS DAILY 02/03/15  Yes Merlyn Albert, MD  aspirin 325 MG EC  tablet Take 325 mg by mouth 2 (two) times daily.    Yes Historical Provider, MD  buPROPion (WELLBUTRIN SR) 150 MG 12 hr tablet One p o qam for three d, then one p o bid 10/05/15  Yes Merlyn Albert, MD  cetirizine (ZYRTEC) 10 MG tablet Take 10 mg by mouth daily as needed.    Yes Historical Provider, MD  citalopram (CELEXA) 40 MG tablet TAKE ONE (1) TABLET EACH DAY 07/20/15  Yes Merlyn Albert, MD  DULERA 200-5 MCG/ACT AERO USE TWO PUFFS FIRST THING IN THE [facility] THEN ANOTHER TWO PUFFS ABOUT 12 HOURS LATER 03/31/15  Yes Nyoka Cowden, MD  glucosamine-chondroitin 500-400 MG tablet  Take 1 tablet by mouth 3 (three) times daily.   Yes Historical Provider, MD  glyBURIDE (DIABETA) 5 MG tablet TAKE TWO TABLETS BY MOUTH TWICE DAILY AFTER MEALS 07/13/15  Yes Merlyn Albert, MD  ipratropium (ATROVENT) 0.02 % nebulizer solution Take 0.5 mg by nebulization 4 (four) times daily as needed for wheezing or shortness of breath.   Yes Historical Provider, MD  lansoprazole (PREVACID) 30 MG capsule TAKE ONE CAPSULE BY MOUTH TWICE A DAY BEFORE A MEAL 07/13/15  Yes Merlyn Albert, MD  Multiple Vitamin (MULTIVITAMIN WITH MINERALS) TABS Take 1 tablet by mouth every morning. Men's once daily multivitamin packet (vitamin e, calcium, ginseng)   Yes Historical Provider, MD  oxyCODONE-acetaminophen (PERCOCET/ROXICET) 5-325 MG per tablet Take 1 tablet by mouth every 6 (six) hours as needed for severe pain. 08/22/15  Yes Merlyn Albert, MD  Probiotic Product (PROBIOTIC PO) Take 1 tablet by mouth daily.   Yes Historical Provider, MD  Saw Palmetto, Serenoa repens, (SAW PALMETTO PO) Take 1 capsule by mouth daily.   Yes Historical Provider, MD  sildenafil (REVATIO) 20 MG tablet 2-3 tablets po prn 08/22/15  Yes Merlyn Albert, MD  ketoconazole (NIZORAL) 2 % cream Apply 1 application topically 2 (two) times daily. Patient not taking: Reported on 10/14/2015 05/23/15   Merlyn Albert, MD   BP 121/92 mmHg  Pulse 107  Temp(Src) 98.3 F (36.8 C) (Oral)  Resp 20  Ht  (1.778 m)  Wt 266 lb (120.657 kg)  BMI 38.17 kg/m2  SpO2 96% Physical Exam  Constitutional: He appears well-developed and well-nourished. No distress.  HENT:  Head: Normocephalic and atraumatic.  Eyes: Conjunctivae are normal. No scleral icterus.  Neck: Normal range of motion. Neck supple.  Cardiovascular: Normal rate, regular rhythm and normal heart sounds.   Pulmonary/Chest: Effort normal and breath sounds normal. No respiratory distress.  Abdominal: Soft. There is no tenderness.  Musculoskeletal: He exhibits edema and  tenderness.  A right knee exam was performed. SKIN: intact SWELLING: none EFFUSION: yes and positive fluid wave WARMTH: mildly increased warmth TENDERNESS:  mild ROM: limited by pain STRENGTH: normal ALIGNMENT: neutral STABILITY: stable to testing NEUROLOGICAL EXAM: normal VASCULAR EXAM: normal   Neurological: He is alert.  Skin: Skin is warm and dry. He is not diaphoretic.  Psychiatric: His behavior is normal.  Nursing note and vitals reviewed.   ED Course  .Joint Aspiration/Arthrocentesis Date/Time: 10/14/2015 1:21 PM Performed by: Arthor Captain Authorized by: Arthor Captain Consent: Verbal consent obtained. Risks and benefits: risks, benefits and alternatives were discussed Consent given by: patient Patient identity confirmed: verbally with patient and provided demographic data Time out: Immediately prior to procedure a "time out" was called to verify the correct patient, procedure, equipment, support staff and site/side marked as required. Indications: joint  swelling and pain  Body area: knee Joint: right knee Local anesthesia used: yes Local anesthetic: bupivacaine 0.5% without epinephrine Anesthetic total: 5 ml Patient sedated: no Preparation: Patient was prepped and draped in the usual sterile fashion. Needle gauge: 18 G Ultrasound guidance: no Approach: superior Aspirate: yellow and clear Aspirate amount: 45 mL Bupivacaine 0.50% amount: 5 ml Patient tolerance: Patient tolerated the procedure well with no immediate complications   (including critical care time) Labs Review Labs Reviewed - No data to display  Imaging Review Dg Knee Complete 4 Views Right  10/14/2015  CLINICAL DATA:  Right knee injury, pain/swelling EXAM: RIGHT KNEE - COMPLETE 4+ VIEW COMPARISON:  None. FINDINGS: No fracture or dislocation is seen. The joint spaces are preserved. Moderate suprapatellar knee joint effusion. IMPRESSION: No fracture or dislocation is seen. Moderate  suprapatellar knee joint effusion. Electronically Signed   By: Charline BillsSriyesh  Krishnan M.D.   On: 10/14/2015 11:48   I have personally reviewed and evaluated these images and lab results as part of my medical decision-making.   EKG Interpretation None      MDM   Final diagnoses:  Knee effusion, right   Concern for internal derangement.  + large joint effution in the suprapatellar region. Increase mobility and pain relief after joint aspiration. Patient has a f/u with ortho this week' Discussed si/sx infection and reasons to seek immediate medical care in the ed.   Arthor Captainbigail Orry Sigl, PA-C 10/14/15 1324  Mancel BaleElliott Wentz, MD 10/15/15 0700

## 2015-10-14 NOTE — ED Notes (Signed)
Stood up on Thursday and heard something pop in right knee.  Rates pain 8/10 and just took 2 percocet at 0930 without relief.

## 2015-10-14 NOTE — ED Notes (Signed)
Knee aspirated by PA Tiburcio PeaHarris

## 2015-10-14 NOTE — ED Notes (Signed)
Discharge instructions given to pt , verbalized understanding. Stated that he had to get his prescription for tramadol by his pain doctor- No other questions, Ambulated off unit with his crutches that he already had with him (also had knee splint at home) .

## 2015-10-14 NOTE — Discharge Instructions (Signed)
Knee Effusion °Knee effusion means that you have excess fluid in your knee joint. This can cause pain and swelling in your knee. This may make your knee more difficult to bend and move. That is because there is increased pain and pressure in the joint. If there is fluid in your knee, it often means that something is wrong inside your knee, such as severe arthritis, abnormal inflammation, or an infection. Another common cause of knee effusion is an injury to the knee muscles, ligaments, or cartilage. °HOME CARE INSTRUCTIONS °· Use crutches as directed by your health care provider. °· Wear a knee brace as directed by your health care provider. °· Apply ice to the swollen area: °¨ Put ice in a plastic bag. °¨ Place a towel between your skin and the bag. °¨ Leave the ice on for 20 minutes, 2-3 times per day. °· Keep your knee raised (elevated) when you are sitting or lying down. °· Take medicines only as directed by your health care provider. °· Do any rehabilitation or strengthening exercises as directed by your health care provider. °· Rest your knee as directed by your health care provider. You may start doing your normal activities again when your health care provider approves.    °· Keep all follow-up visits as directed by your health care provider. This is important. °SEEK MEDICAL CARE IF: °· You have ongoing (persistent) pain in your knee. °SEEK IMMEDIATE MEDICAL CARE IF: °· You have increased swelling or redness of your knee. °· You have severe pain in your knee. °· You have a fever. °  °This information is not intended to replace advice given to you by your health care provider. Make sure you discuss any questions you have with your health care provider. °  °Document Released: 02/08/2004 Document Revised: 12/09/2014 Document Reviewed: 07/04/2014 °Elsevier Interactive Patient Education ©2016 [employer] ° °

## 2015-10-16 ENCOUNTER — Telehealth: Payer: Self-pay | Admitting: Family Medicine

## 2015-10-16 NOTE — Telephone Encounter (Signed)
Pt had fluid pulled off his right knee Saturday morning [name] Was issued Tramadol for the pain an wants to make sure you ok with him  Filling this script

## 2015-10-16 NOTE — Telephone Encounter (Signed)
Patient is scheduled for 10/17/15 at 9:00. Pt is aware.

## 2015-10-16 NOTE — Telephone Encounter (Signed)
Called patient and informed him per Dr.Steve Luking- Ok short term, but not long term. Patient verbalized understanding.

## 2015-10-16 NOTE — Telephone Encounter (Signed)
Ok short term not long term

## 2015-10-17 ENCOUNTER — Encounter: Payer: Self-pay | Admitting: Orthopedic Surgery

## 2015-10-17 ENCOUNTER — Ambulatory Visit (INDEPENDENT_AMBULATORY_CARE_PROVIDER_SITE_OTHER): Payer: Medicare Other | Admitting: Orthopedic Surgery

## 2015-10-17 VITALS — BP 115/81 | Ht 70.0 in | Wt 266.0 lb

## 2015-10-17 DIAGNOSIS — S83241A Other tear of medial meniscus, current injury, right knee, initial encounter: Secondary | ICD-10-CM | POA: Diagnosis not present

## 2015-10-17 DIAGNOSIS — M25461 Effusion, right knee: Secondary | ICD-10-CM | POA: Diagnosis not present

## 2015-10-17 NOTE — Patient Instructions (Signed)
We will schedule MRI for you and call you with appointment 

## 2015-10-17 NOTE — Progress Notes (Signed)
Patient ID: TIN ENGRAM, male   DOB: 1971/06/29, 44 y.o.   MRN: [id number]  Chief Complaint  Patient presents with  . Knee Pain    er follow up right knee pain and swelling s/p to hearing a popping sound when standing up, DOI 10/14/15    HPI Joseph [name] is a 44 y.o. male.  Who stood up from a seated position felt a loud pop in his knee. This was approximately 3 days ago. The next morning his knee was swollen and he couldn't walk or put pressure on it so he went to the ER. In the ER they draw 45 mL of fluid he presents now for evaluation and treatment with the following complaints:  Right knee pain effusion medial joint line pain with ambulation stiffness, loss of motion. Aching pain along the medial joint line with throbbing.  Review of systems he has radicular pain in his right leg radiating down from his hip into his thigh knee and posterior calf which happened after the injury. No fever associated with the effusion. No evidence of a rash around the knee joint.  Review of Systems Review of Systems  Past Medical History  Diagnosis Date  . Diabetes mellitus   . COPD (chronic obstructive pulmonary disease) (HCC)   . Sleep apnea   . Asthma   . Chronic pain of left knee   . Chronic back pain   . Shortness of breath   . GERD (gastroesophageal reflux disease)   . Arthritis     Past Surgical History  Procedure Laterality Date  . Carpal tunnel release Left   . Shoulder arthrocentesis Right   . Vasectomy    . Hernia repair Bilateral   . Knee arthroscopy with medial menisectomy Left 04/22/2014    Procedure: LEFT KNEE ARTHROSCOPY WITH MEDIAL MENISECTOMY;  Surgeon: Vickki Hearing, MD;  Location: [facility];  Service: Orthopedics;  Laterality: Left;    Family History  Problem Relation Age of Onset  . Emphysema Maternal Grandfather   . Emphysema Maternal Grandmother   . Clotting disorder Maternal Grandmother     Social History Social History  Substance Use Topics  . Smoking  status: Current Every Day Smoker -- 1.50 packs/day for 20 years    Types: Cigarettes    Start date: 12/17/1993  . Smokeless tobacco: Current User  . Alcohol Use: No    Allergies  Allergen Reactions  . Azithromycin Hives  . Erythromycin Hives  . Keflex [Cephalexin] Nausea Only  . Metformin Nausea And Vomiting  . Symbicort [Budesonide-Formoterol Fumarate] Other (See Comments)    States that 3 doses were used and breathing became worse  . Doxycycline Rash    Current Outpatient Prescriptions  Medication Sig Dispense Refill  . ACAI PO Take 1 tablet by mouth daily.    Marland Kitchen albuterol (PROAIR HFA) 108 (90 BASE) MCG/ACT inhaler 2 puffs every 4 hours if needed 1 Inhaler 11  . albuterol (PROVENTIL) (2.5 MG/3ML) 0.083% nebulizer solution USE ONE VIAL IN NEBULIZER FOUR TIMES DAILY (Patient taking differently: USE ONE VIAL IN NEBULIZER FOUR TIMES DAILY as needed) 360 mL 5  . ALPRAZolam (XANAX) 1 MG tablet TAKE ONE TABLET BY MOUTH TWICE A DAY AS NEEDED FOR ANXIETY 60 tablet 5  . ANDROGEL PUMP 20.25 MG/ACT (1.62%) GEL USE 2 PUMPS DAILY 75 g 5  . aspirin 325 MG EC tablet Take 325 mg by mouth 2 (two) times daily.     Marland Kitchen buPROPion (WELLBUTRIN SR) 150 MG 12 hr  tablet One p o qam for three d, then one p o bid 60 tablet 2  . citalopram (CELEXA) 40 MG tablet TAKE ONE (1) TABLET EACH DAY 30 tablet 6  . DULERA 200-5 MCG/ACT AERO USE TWO PUFFS FIRST THING IN THE MORNINGAND THEN ANOTHER TWO PUFFS ABOUT 12 HOURS LATER 13 g 11  . glucosamine-chondroitin 500-400 MG tablet Take 1 tablet by mouth 3 (three) times daily.    Marland Kitchen glyBURIDE (DIABETA) 5 MG tablet TAKE TWO TABLETS BY MOUTH TWICE DAILY AFTER MEALS 120 tablet 4  . ipratropium (ATROVENT) 0.02 % nebulizer solution Take 0.5 mg by nebulization 4 (four) times daily as needed for wheezing or shortness of breath.    . lansoprazole (PREVACID) 30 MG capsule TAKE ONE CAPSULE BY MOUTH TWICE A DAY BEFORE A MEAL 60 capsule 3  . Multiple Vitamin (MULTIVITAMIN WITH MINERALS)  TABS Take 1 tablet by mouth every morning. Men's once daily multivitamin packet (vitamin e, calcium, ginseng)    . oxyCODONE-acetaminophen (PERCOCET/ROXICET) 5-325 MG per tablet Take 1 tablet by mouth every 6 (six) hours as needed for severe pain. 120 tablet 0  . Probiotic Product (PROBIOTIC PO) Take 1 tablet by mouth daily.    . Saw Palmetto, Serenoa repens, (SAW PALMETTO PO) Take 1 capsule by mouth daily.    . traMADol (ULTRAM) 50 MG tablet Take 1 tablet (50 mg total) by mouth every 6 (six) hours as needed. 15 tablet 0  . cetirizine (ZYRTEC) 10 MG tablet Take 10 mg by mouth daily as needed.     Marland Kitchen ketoconazole (NIZORAL) 2 % cream Apply 1 application topically 2 (two) times daily. (Patient not taking: Reported on 10/14/2015) 60 g 0  . sildenafil (REVATIO) 20 MG tablet 2-3 tablets po prn (Patient not taking: Reported on 10/17/2015) 20 tablet 11   No current facility-administered medications for this visit.       Physical Exam Physical Exam Blood pressure 115/81, height  (1.778 m), weight 266 lb (120.657 kg). Appearance, there are no abnormalities in terms of appearance the patient was well-developed and well-nourished. The grooming and hygiene were normal.  Mental status orientation, there was normal alertness and orientation Mood pleasant Ambulatory status abnormal with cane assistive devices  Examination of the right knee Inspection large joint effusion, medial femoral condyle and medial joint line tenderness Range of motion passive range of motion 100 of flexion in the knee becomes very tight Tests for stability anterior cruciate ligament PCL collateral ligament stable Motor strength  grade 5 motor strength in the quadriceps Skin warm dry and intact without laceration or ulceration or erythema Neurologic examination normal sensation Vascular examination normal pulses with warm extremity and normal capillary refill  The opposite knee reveals no swelling or effusion, full  range of motion, normal muscle tone, anterior and posterior drawer test stable neurovascular exam intact  Data Reviewed Plain films hospital for x-rays my interpretation of those films effusion minimal arthrosis  Assessment  Torn medial meniscus possible patellar subluxation right knee   Plan  1. Aspiration Aspiration injection I pulled off 25 mL of amber-colored fluid I injected 40 mg of Depo-Medrol Procedure note injection and aspiration right knee joint  Verbal consent was obtained to aspirate and inject the right knee joint   Timeout was completed to confirm the site of aspiration and injection  An 18-gauge needle was used to aspirate the knee joint from a suprapatellar lateral approach.  The medications used were 40 mg of Depo-Medrol and 1% lidocaine 3  cc  Anesthesia was provided by ethyl chloride and the skin was prepped with alcohol.  After cleaning the skin with alcohol an 18-gauge needle was used to aspirate the right knee joint.  We obtained 25  cc of fluid  We follow this by injection of 40 mg of Depo-Medrol and 3 cc 1% lidocaine.  There were no complications. A sterile bandage was applied.  2. MRI right knee evaluate torn meniscus medially for possible surgery

## 2015-10-23 ENCOUNTER — Telehealth: Payer: Self-pay | Admitting: *Deleted

## 2015-10-23 NOTE — Telephone Encounter (Signed)
Patient has called to check the status of his MRI appointment. Please advise

## 2015-10-24 ENCOUNTER — Ambulatory Visit (HOSPITAL_COMMUNITY): Payer: Medicare Other

## 2015-10-31 ENCOUNTER — Encounter: Payer: Self-pay | Admitting: Internal Medicine

## 2015-10-31 ENCOUNTER — Ambulatory Visit (INDEPENDENT_AMBULATORY_CARE_PROVIDER_SITE_OTHER): Payer: BLUE CROSS/BLUE SHIELD | Admitting: Internal Medicine

## 2015-10-31 VITALS — BP 132/86 | HR 93 | Ht 70.0 in | Wt 273.0 lb

## 2015-10-31 DIAGNOSIS — J45909 Unspecified asthma, uncomplicated: Secondary | ICD-10-CM | POA: Diagnosis not present

## 2015-10-31 DIAGNOSIS — Z720 Tobacco use: Secondary | ICD-10-CM

## 2015-10-31 DIAGNOSIS — F17210 Nicotine dependence, cigarettes, uncomplicated: Secondary | ICD-10-CM

## 2015-10-31 NOTE — Patient Instructions (Signed)
Increase the dulera 200 Take 2 puffs first thing in am and then another 2 puffs about 12 hours later.   Only use your albuterol (proair) as a rescue medication to be used if you can't catch your breath by resting or doing a relaxed purse lip breathing pattern.  - The less you use it, the better it will work when you need it. - Ok to use up to 2 puffs  every 4 hours if you must but call for immediate appointment if use goes up over your usual need - Don't leave home without it !!  (think of it like the spare tire for your car)   Only use the nebulizer if you try the Proair first and it doesn't work  Please schedule a follow up visit in 6 weeks but call sooner if needed and no rescue inhalers or nebs that morning

## 2015-10-31 NOTE — Progress Notes (Signed)
Subjective:   Patient ID: Joseph [name], male    DOB: 04/09/1971   MRN: [id number]    Brief patient profile:  44yowm active smoker without significant airflow obst by PFTs 06/02/12 and 03/08/2015    History of Present Illness  IOV 04/10/12   In April 2013 cc  had pneumonia apparently. Went to [facility] ER and found some abnormality (nodule based on review of ER notes) on CXR that resulted in CT Angio 03/07/12 same visit. PE ruled out but found to have mediastinal nodes but the cxr nodule was not there. Apparently ER doctor said it was lymphoma and asked to see an oncologist. But primary care doctor felt that was over zealous and referred patient here. He prefers conservative mgmt to nodes if clinical suspicion for cancer is low  cc lack of energy (spent most of days yesterday in bed), esp in pollen, chronic dyspnea on exertion for 180 feet relieved by rest  But slowly progressive since 2002 for which he uses albuterol prn on average 4 times a day.  Walkt test 185 feet x 3 laps: no desaturation  He smokes at baseline; trying quit, prior intolerance (dreams) with chantix.   Only baseline mild chronic cough with associtaed wheeze that is occasional.. Does have some B symptoms like nocturnal diaphoresis (soaks pillows). Denies weight loss.   CT ANGIOGRAPHY CHEST  Comparison: 03/07/2012  An abnormal right lower paratracheal node has a short axis diameter  of 1.6 cm. An abnormal AP window lymph node has a short axis  diameter of 1.4 cm. Mild bilateral hilar and infrahilar adenopathy  noted. Scattered small axillary lymph nodes are present.  A subcarinal node has a short axis diameter of 1.5 cm.     OV 08/14/2012 Mediastinal nodes: PET scan 06/10/12 shows very low uptake in the mediastinal nodes. Suspicion for cancer is very low. Lymph nodes include 13 mm right lower paratracheal lymph node and 10 mm prevascular lymph node. ACE level 04/10/12  - is in 20s and normal  Still dyspneic: 1 flught of  steps and better with rest and with albuterol and atrovent prn. Dyspne also worse in winter and cooler weathers. Rates it as moderate. STable since last visit to slightly worse. thre is some associtaed cough. No asociated weight loss. COugh is moderate and worst early in morning and in cooler temperatues.. Denies sinus drainage. PFts show mixed picture - fvc 3.4L/69%, fev1 2.9/76%, Ratio 85 but 12% bd respose. TLC 5.7/83%. DLCO 23.7/69% rec rec #MEdiastinal node  - suspicion for cancer is low  - you will need CT chest in a year; we will schedule it later  #Smoking  - glad you are working on quitting  #Shortness of breath - this is due to asthma/copd and weight  - please start QVAR 2 puff twice daily - take sample and show technique and take prescription too  - focus on weight loss through the low glycemic diet; take diet sheet from [place]  #FOllowup  - 3 months with spirometry at followup  - flu shot and pneumovax through PMD as discussed   12/17/2013  acute ov/Aleda Madl still active smoker re: recurrent pattern of coughing x a decade intermittent / out of control since nov 2014/ can't take symbicort makes it worse / on percocet one tid at baseline  Chief Complaint  Patient presents with  . Acute Visit    MR pt.  C/o mostly nonprod cough with some thick white mucous, coughing until he almost passes out.  Went to [facility] last week.    duoneb daily  Sometime skips the doses later in the day but always uses the am dose, sputum to purulent. Mostly sob with cough/ otherwise breaths ok Last neb was 4 h prior to OV   rec For Cough - use the flutter valve as much as you can plus mucinex dm 1200 mg every 12 hours as needed supplement with percocet up to 2 every 4hours For Breathing  Only use your duoneb  rescue medication . - Ok to use up to    every 4 hours if you must  Prednisone 10 mg take  4 each am x 2 days,   2 each am x 2 days,  1 each am x 2 days and stop  Pantoprazole (protonix) 40 mg    Take 30-60 min before first meal of the day and Pepcid 20 mg one bedtime until return to office - this is the best way to tell whether stomach acid is contributing to your problem.  GERD  Diet . Please schedule a follow up office visit in 2 weeks, sooner if needed with all meds in hand  07/27/2014 f/u ov/Mashanda Ishibashi re: asthma / still smoking  Chief Complaint  Patient presents with  . Follow-up    Pt c/o increased SOB and cough for the past month- cough is occ prod with minimal clear sputum. Pt currently taking clindamycin for staph infection. Rarely using albuterol inhaler, but is using neb approx 4 times per day.   off dulera x 1.5 week denies more sob/ cough or need for rescue on vs off rec Plan A=  dulera 200 Take 2 puffs first thing in am and then another 2 puffs about 12 hours later (or formulary alternative- call us if you find a cheaper one)  Plan B =  Backup = proaire  Plan C = Backup plan B = nebulized albuterol up to 4 hours but call if needing this much at all The key is to stop smoking completely before smoking completely stops you!    11/29/2014 f/u ov/Celeste Candelas re: s/p "aecopd" still smoking  Chief Complaint  Patient presents with  . Follow-up    Pt states that his SOB and cough have been worse for the past several wks. He went to ED for COPD exacerbation on 11/14/14.  He is using proair 3-4 times per day and neb about 3 times per day on average.   one month prior to flare needing proair 1-2 per day and neb at least once or twice also - using neb first thing instead of dulera  Better p pred from er then worse again w/in a week of taper off assoc with congested cough but mostly white mucus esp in ams rec Think of your respiratory medications as multiple steps you can take to control your symptoms and avoid having to go to the ER Plan A is your maintenance daily no matter what meds: dulera Take 2 puffs first thing in am and then another 2 puffs about 12 hours later.  Plan B only use after  you've used your maintenance (Plan A) medication, and only if you can't catch your breath: Proair 2 every 4 hours as needed  Plan C only use after you've used plan A and B and still can't catch your breath: nebulizer albuterol 2.5 mg up to every 4 hours  Plan D(for Doctor):  If you've used A thru C and not doing a lot better or still needing C more  than a once a day,  D = call the doctor for evaluation asap Plan E (for ER):  If still not able to catch your breath, even after using your nebulizer up to every 4 hours, go to ER  For cough > delsym 2 tsp every 12 hours is the best you can buy Prevacid 30 mg Take 30-60 min before first meal of the day  GERD diet      03/08/2015 f/u ov/Sindee Stucker re: still smoking / off dulera x 48 h and off saba hfa/ using neb 8 am of test /no change in symptoms or need for saba on or off dulera  Chief Complaint  Patient presents with  . Follow-up    PFT done today. Pt states that his breathing is about the same since last visit. He is using albuterol inhaler approx 3 x per day.   breathing does better if stays in house,  worse out in cold Even on dulera still using neb at least twice daily  Some sporadic dry fits of  cough with bending over even on dulera but not using ppi correctly  rec Prevacid 30 mg should be to Take 30- 60 min before your first and last meals of the day  I see no evidence that the dulera is helping you and would stop it at this point pending an asthma challenge test  Please see patient coordinator before you leave today  to schedule Methacholine test - do not take any albuterol with in 6 hours of the test> never done     10/03/2015  f/u ov/Daimen Shovlin re: still smoking /  ? asthma vs all vcd/ gerd  Chief Complaint  Patient presents with  . Follow-up    Pt states his breathing is overall doing well. He is using rescue inhaler and neb several times per day.   Wife left him in July 2016 and losing wt, dm better, breathing not since stopped dulera and  never went for mct and can't afford it  No noct symptoms after hs saba/ cpap  saba use up when no access to dulera 200 but even on it he continues to perceive need for saba daily despite nl exams/ pfts rec Please see patient coordinator before you leave today  to schedule a methacholine challenge test>  POSITIVE Ok to resume dulera but stop it 24 hours before the methacholine and no albuterol w/in 6 hours  Please schedule a follow up office visit in 4 weeks, sooner if needed     10/31/2015  f/u ov/Tiarrah Saville re: mild persistent asthma  Chief Complaint  Patient presents with  . Follow-up    Breathing has been worse since decrease in [place].  He is using rescue inhaler 4-5 x per day and duoneb 2 x per day.    noted a change on the dulera 100 but can't afford dulera in any form/ reliant on samples   No obvious patterns in day to day or daytime variabilty or excess/ purulent sputum  or assoc cp or  subjective wheeze overt sinus or hb symptoms. No unusual exp hx or h/o childhood pna/ asthma or knowledge of premature birth.  Sleeping ok without nocturnal  or early am exacerbation  of respiratory  c/o's or need for noct saba. Also denies any obvious fluctuation of symptoms with weather or environmental changes or other aggravating or alleviating factors except as outlined above   Current Medications, Allergies, Complete Past Medical History, Past Surgical History, Family History, and Social History were reviewed  in [facility] electronic medical record.  ROS  The following are not active complaints unless bolded sore throat, dysphagia, dental problems, itching, sneezing,  nasal congestion or excess/ purulent secretions, ear ache,   fever, chills, sweats, unintended wt loss, pleuritic or exertional cp, hemoptysis,  orthopnea pnd or leg swelling, presyncope, palpitations, heartburn, abdominal pain, anorexia, nausea, vomiting, diarrhea  or change in bowel or urinary habits, change in stools or urine,  dysuria,hematuria,  rash, arthralgias, visual complaints, headache, numbness weakness or ataxia or problems with walking or coordination,  change in mood/affect or memory.        Objective:   Physical Exam  amb obese wm nad  / vital signs reviewed  12/31/2013  294  >    07/27/2014  286 > 11/29/2014   289> 03/08/2015  284 > 10/03/2015 266 > 10/31/2015 273      HEENT: nl dentition, turbinates, and oropharynx. Nl external ear canals without cough reflex   NECK :  without JVD/Nodes/TM/ nl carotid upstrokes bilaterally   LUNGS: no acc muscle use, clear to A and P bilaterally with    CV:  RRR  no s3 or murmur or increase in P2, no edema   ABD:  soft and nontender with nl excursion in the supine position. No bruits or organomegaly, bowel sounds nl  MS:  warm without deformities, calf tenderness, cyanosis or clubbing  SKIN: warm and dry without lesions    NEURO:  alert, approp, no deficits              Assessment & Plan:

## 2015-10-31 NOTE — Assessment & Plan Note (Signed)
Complicated by aodm/ osa /gerd   Body mass index is 39.17  Trending furhter up No results found for: TSH   Contributing to gerd tendency/ doe/reviewed the need and the process to achieve and maintain neg calorie balance > defer f/u primary care including intermittently monitoring thyroid status

## 2015-10-31 NOTE — Assessment & Plan Note (Signed)

## 2015-10-31 NOTE — Assessment & Plan Note (Signed)
-   PFT's s sign copd 06/2012 - med calendar 2/13 /15 > not using 11/29/14  - 11/29/2014 p extensive coaching HFA effectiveness =    90% from a baseline of 50%  -PFTs wnl 03/08/2015 4 h p alb neb> rec MCT at least 6 h p neb > done 10/12/2015  POS > resume dulera 100 2bid   - 10/31/2015  extensive coaching HFA effectiveness =    90% > increase dulera to 200 bid (samples provided)   Says he can't tolerate breo or symbicort, only dulera, which he can't afford though still spending on cigs  ? Element of anxiety also but already on xanax/celexa   For now rec continue dulera 200 samples but goal is to reduce reliance on B2  I had an extended discussion with the patient reviewing all relevant studies completed to date and  lasting 15 to 20 minutes of a 25 minute visit    Each maintenance medication was reviewed in detail including most importantly the difference between maintenance and prns and under what circumstances the prns are to be triggered using an action plan format that is not reflected in the computer generated alphabetically organized AVS.    Please see instructions for details which were reviewed in writing and the patient given a copy highlighting the part that I personally wrote and discussed at today's ov.

## 2015-11-01 ENCOUNTER — Other Ambulatory Visit: Payer: Self-pay | Admitting: Family Medicine

## 2015-11-01 NOTE — Telephone Encounter (Signed)
May have this and to refills

## 2015-11-03 ENCOUNTER — Other Ambulatory Visit: Payer: Self-pay | Admitting: Family Medicine

## 2015-11-03 NOTE — Telephone Encounter (Signed)
May have one refill 

## 2015-11-13 ENCOUNTER — Ambulatory Visit (HOSPITAL_COMMUNITY)
Admission: RE | Admit: 2015-11-13 | Discharge: 2015-11-13 | Disposition: A | Discharge status: Home / Self Care | Payer: BLUE CROSS/BLUE SHIELD | Source: Ambulatory Visit | Attending: Orthopedic Surgery | Admitting: Orthopedic Surgery

## 2015-11-13 DIAGNOSIS — M25561 Pain in right knee: Secondary | ICD-10-CM | POA: Diagnosis not present

## 2015-11-13 DIAGNOSIS — M25461 Effusion, right knee: Secondary | ICD-10-CM | POA: Insufficient documentation

## 2015-11-13 DIAGNOSIS — M2241 Chondromalacia patellae, right knee: Secondary | ICD-10-CM | POA: Diagnosis not present

## 2015-11-13 DIAGNOSIS — S83241A Other tear of medial meniscus, current injury, right knee, initial encounter: Secondary | ICD-10-CM | POA: Diagnosis not present

## 2015-11-13 DIAGNOSIS — X58XXXA Exposure to other specified factors, initial encounter: Secondary | ICD-10-CM | POA: Diagnosis not present

## 2015-11-16 ENCOUNTER — Other Ambulatory Visit: Payer: Self-pay | Admitting: Family Medicine

## 2015-11-16 NOTE — Telephone Encounter (Signed)
Ok six mo worth 

## 2015-11-20 ENCOUNTER — Telehealth: Payer: Self-pay | Admitting: Orthopedic Surgery

## 2015-11-20 NOTE — Telephone Encounter (Signed)
Message left   To call back if he wants to proceed with surgery [facility]

## 2015-11-21 ENCOUNTER — Ambulatory Visit (INDEPENDENT_AMBULATORY_CARE_PROVIDER_SITE_OTHER): Payer: BLUE CROSS/BLUE SHIELD | Admitting: Orthopedic Surgery

## 2015-11-21 VITALS — BP 135/90 | Ht 70.0 in | Wt 273.0 lb

## 2015-11-21 DIAGNOSIS — S83241D Other tear of medial meniscus, current injury, right knee, subsequent encounter: Secondary | ICD-10-CM | POA: Diagnosis not present

## 2015-11-21 NOTE — Progress Notes (Signed)
MRI review and preop for surgery  Chief complaint right knee pain  History patient felt a pop in his knee several weeks ago  Patient had MRI shows torn bucket-handle meniscal tear medial meniscus  Past Medical History  Diagnosis Date  . Diabetes mellitus   . COPD (chronic obstructive pulmonary disease) (HCC)   . Sleep apnea   . Asthma   . Chronic pain of left knee   . Chronic back pain   . Shortness of breath   . GERD (gastroesophageal reflux disease)   . Arthritis    Past Surgical History  Procedure Laterality Date  . Carpal tunnel release Left   . Shoulder arthrocentesis Right   . Vasectomy    . Hernia repair Bilateral   . Knee arthroscopy with medial menisectomy Left 04/22/2014    Procedure: LEFT KNEE ARTHROSCOPY WITH MEDIAL MENISECTOMY;  Surgeon: Vickki HearingStanley E Tiahna Cure, MD;  Location: [facility];  Service: Orthopedics;  Laterality: Left;   Social History  Substance Use Topics  . Smoking status: Current Every Day Smoker -- 1.50 packs/day for 20 years    Types: Cigarettes    Start date: 12/17/1993  . Smokeless tobacco: Current User  . Alcohol Use: No   Family History  Problem Relation Age of Onset  . Emphysema Maternal Grandfather   . Emphysema Maternal Grandmother   . Clotting disorder Maternal Grandmother    BP 135/90 mmHg  Ht 5\' 10"  (1.778 m)  Wt 273 lb (123.832 kg)  BMI 39.17 kg/m2 Obesity Normal grooming hygiene oriented 3 mood normal gait supported by a cane Skin normal Normal sensation in the right leg Pulses normal in the right leg Motor exam grade 5 motor strength right leg demonstrate stable right knee knee range of motion 120. Medial joint line positive McMurray sign.  MRI   IMPRESSION: 1. Bucket-handle tear of the medial meniscus flipped towards the intercondylar notch. 2. 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 3. Partial thickness cartilage loss the medial femoral condyle and medial tibial plateau. 4.  Moderate joint effusion.   Electronically Signed  By: Elige KoHetal Patel   Summation of our discussion  You have decided to proceed with operative arthroscopy of the knee. You have decided not to continue with nonoperative measures such as but not limited to oral medication, weight loss, activity modification, physical therapy, bracing, or injection.  We will perform operative arthroscopy of the knee. Some of the risks associated with arthroscopic surgery of the knee include but are not limited to Bleeding Infection Swelling Stiffness Blood clot Pain  If you're not comfortable with these risks and would like to continue with nonoperative treatment please let Dr. Romeo AppleHarrison know prior to your surgery.  Arthroscopy right knee partial medial meniscectomy  6 weeks recovery.  On: 11/14/2015 08:26

## 2015-11-21 NOTE — Patient Instructions (Signed)
SURGERY 12/05/14 RIGHT KNEE ARTHROSCOPY WITH MEDIAL MENISECTOMY

## 2015-11-22 NOTE — Addendum Note (Signed)
Addended by: Adella HareBOOTHE, JAIME B on: 11/22/2015 02:45 PM   Modules accepted: Orders

## 2015-11-27 ENCOUNTER — Other Ambulatory Visit: Payer: Self-pay | Admitting: Family Medicine

## 2015-11-30 NOTE — Patient Instructions (Signed)
Your procedure is scheduled on: 12/06/2015  Report to [facility] at   9:45  AM.  Call this number if you have problems the morning of surgery: [phone]   Remember:   Do not drink or eat food:After Midnight.  :  Take these medicines the morning of surgery with A SIP OF WATER: Wellbutrin, Celexa, Prevacid, Tramadol and Revatio             Use inhalers morning of sugery  Do not wear jewelry, make-up or nail polish.  Do not wear lotions, powders, or perfumes. You may wear deodorant.  Do not shave 48 hours prior to surgery. Men may shave face and neck.  Do not bring valuables to the hospital.  Contacts, dentures or bridgework may not be worn into surgery.  Leave suitcase in the car. After surgery it may be brought to your room.  For patients admitted to the hospital, checkout time is 11:00 AM the day of discharge.   Patients discharged the day of surgery will not be allowed to drive home.    Special Instructions: Shower using CHG night before surgery and shower the day of surgery use CHG.  Use special wash - you have one bottle of CHG for all showers.  You should use approximately 1/2 of the bottle for each shower. Knee Arthroscopy, Care After Refer to this sheet in the next few weeks. These instructions provide you with information about caring for yourself after your procedure. Your health care provider may also give you more specific instructions. Your treatment has been planned according to current medical practices, but problems sometimes occur. Call your health care provider if you have any problems or questions after your procedure. WHAT TO EXPECT AFTER THE PROCEDURE After your procedure, it is common to have:  Soreness.  Pain. HOME CARE INSTRUCTIONS Bathing  Do not take baths, swim, or use a hot tub until your health care provider approves. Incision Care  There are many different ways to close and cover an incision, including stitches, skin glue, and adhesive strips. Follow  your health care provider's instructions about:  Incision care.  Bandage (dressing) changes and removal.  Incision closure removal.  Check your incision area every day for signs of infection. Watch for:  Redness, swelling, or pain.  Fluid, blood, or pus. Activity  Avoid strenuous activities for as long as directed by your health care provider.  Return to your normal activities as directed by your health care provider. Ask your health care provider what activities are safe for you.  Perform range-of-motion exercises only as directed by your health care provider.  Do not lift anything that is heavier than 10 lb (4.5 kg).  Do not drive or operate heavy machinery while taking pain medicine.  If you were given crutches, use them as directed by your health care provider. Managing Pain, Stiffness, and Swelling  If directed, apply ice to the injured area:  Put ice in a plastic bag.  Place a towel between your skin and the bag.  Leave the ice on for 20 minutes, 2-3 times per day.  Raise the injured area above the level of your heart while you are sitting or lying down as directed by your health care provider. General Instructions  Keep all follow-up visits as directed by your health care provider. This is important.  Take medicines only as directed by your health care provider.  Do not use any tobacco products, including cigarettes, chewing tobacco, or electronic cigarettes. If you  need help quitting, ask your health care provider.  If you were given compression stockings, wear them as directed by your health care provider. These stockings help prevent blood clots and reduce swelling in your legs. SEEK MEDICAL CARE IF:  You have severe pain with any movement of your knee.  You notice a bad smell coming from the incision or dressing.  You have redness, swelling, or pain at the site of your incision.  You have fluid, blood, or pus coming from your incision. SEEK IMMEDIATE  MEDICAL CARE IF:  You develop a rash.  You have a fever.  You have difficulty breathing or have shortness of breath.  You develop pain in your calves or in the back of your knee.  You develop chest pain.  You develop numbness or tingling in your leg or foot.   This information is not intended to replace advice given to you by your health care provider. Make sure you discuss any questions you have with your health care provider.   Document Released: 06/07/2005 Document Revised: 04/04/2015 Document Reviewed: 11/14/2014 Elsevier Interactive Patient Education 2016 [employer] General Anesthesia, Adult, Care After Refer to this sheet in the next few weeks. These instructions provide you with information on caring for yourself after your procedure. Your health care provider may also give you more specific instructions. Your treatment has been planned according to current medical practices, but problems sometimes occur. Call your health care provider if you have any problems or questions after your procedure. WHAT TO EXPECT AFTER THE PROCEDURE After the procedure, it is typical to experience:  Sleepiness.  Nausea and vomiting. HOME CARE INSTRUCTIONS  For the first 24 hours after general anesthesia:  Have a responsible person with you.  Do not drive a car. If you are alone, do not take public transportation.  Do not drink alcohol.  Do not take medicine that has not been prescribed by your health care provider.  Do not sign important papers or make important decisions.  You may resume a normal diet and activities as directed by your health care provider.  Change bandages (dressings) as directed.  If you have questions or problems that seem related to general anesthesia, call the hospital and ask for the anesthetist or anesthesiologist on call. SEEK MEDICAL CARE IF:  You have nausea and vomiting that continue the day after anesthesia.  You develop a rash. SEEK IMMEDIATE  MEDICAL CARE IF:   You have difficulty breathing.  You have chest pain.  You have any allergic problems.   This information is not intended to replace advice given to you by your health care provider. Make sure you discuss any questions you have with your health care provider.   Document Released: 02/24/2001 Document Revised: 12/09/2014 Document Reviewed: 03/18/2012 Elsevier Interactive Patient Education [employer]

## 2015-12-01 ENCOUNTER — Other Ambulatory Visit: Payer: Self-pay | Admitting: Family Medicine

## 2015-12-01 ENCOUNTER — Telehealth: Payer: Self-pay | Admitting: Orthopedic Surgery

## 2015-12-01 ENCOUNTER — Encounter (HOSPITAL_COMMUNITY)
Admission: RE | Admit: 2015-12-01 | Discharge: 2015-12-01 | Disposition: A | Discharge status: Home / Self Care | Payer: BLUE CROSS/BLUE SHIELD | Source: Ambulatory Visit | Attending: Orthopedic Surgery | Admitting: Orthopedic Surgery

## 2015-12-01 ENCOUNTER — Encounter (HOSPITAL_COMMUNITY): Payer: Self-pay

## 2015-12-01 DIAGNOSIS — Z01818 Encounter for other preprocedural examination: Secondary | ICD-10-CM | POA: Diagnosis not present

## 2015-12-01 HISTORY — DX: Anxiety disorder, unspecified: F41.9

## 2015-12-01 LAB — BASIC METABOLIC PANEL
ANION GAP: 11 (ref 5–15)
BUN: 7 mg/dL (ref 6–20)
CHLORIDE: 101 mmol/L (ref 101–111)
CO2: 24 mmol/L (ref 22–32)
Calcium: 10.7 mg/dL — ABNORMAL HIGH (ref 8.9–10.3)
Creatinine, Ser: 0.91 mg/dL (ref 0.61–1.24)
GFR calc non Af Amer: >60 mL/min (ref >60)
GLUCOSE: 248 mg/dL — AB (ref 65–99)
POTASSIUM: 4.3 mmol/L (ref 3.5–5.1)
Sodium: 136 mmol/L (ref 135–145)

## 2015-12-01 LAB — CBC WITH DIFFERENTIAL/PLATELET
BASOS ABS: 0.1 10*3/uL (ref 0.0–0.1)
BASOS PCT: 1 %
Eosinophils Absolute: 0.2 10*3/uL (ref 0.0–0.7)
Eosinophils Relative: 1 %
HEMATOCRIT: 48.6 % (ref 39.0–52.0)
HEMOGLOBIN: 17 g/dL (ref 13.0–17.0)
LYMPHS PCT: 21 %
Lymphs Abs: 2.5 10*3/uL (ref 0.7–4.0)
MCH: 31.5 pg (ref 26.0–34.0)
MCHC: 35 g/dL (ref 30.0–36.0)
MCV: 90.2 fL (ref 78.0–100.0)
MONO ABS: 0.8 10*3/uL (ref 0.1–1.0)
Monocytes Relative: 7 %
NEUTROS ABS: 8.6 10*3/uL — AB (ref 1.7–7.7)
NEUTROS PCT: 70 %
Platelets: 228 10*3/uL (ref 150–400)
RBC: 5.39 MIL/uL (ref 4.22–5.81)
RDW: 13.6 % (ref 11.5–15.5)
WBC: 12.2 10*3/uL — ABNORMAL HIGH (ref 4.0–10.5)

## 2015-12-01 MED ORDER — OXYCODONE-ACETAMINOPHEN 5-325 MG PO TABS: 1.0000 | ORAL_TABLET | Freq: Four times a day (QID) | ORAL | Status: DC | PRN

## 2015-12-01 NOTE — Pre-Procedure Instructions (Signed)
Patient given information to sign up for my chart at home. 

## 2015-12-01 NOTE — Telephone Encounter (Signed)
Last pain management appt 9/16 given 3 scripts and last script filled 11/30 for #120-patient having knee surgery with ortho next week who will handle that pain management

## 2015-12-01 NOTE — Addendum Note (Signed)
Addended by: Dereck LigasJOHNSON, Jayro Mcmath P on: 12/01/2015 04:45 PM   Modules accepted: Orders

## 2015-12-01 NOTE — Telephone Encounter (Signed)
plz find out from pharm when last filled, have pt pick up rx

## 2015-12-01 NOTE — Telephone Encounter (Signed)
Patient needs refill on perocet 5/325 states having knee surgery next Wednesday  with Dr.Harrison and is completely out of pain meds. Call [phone].

## 2015-12-01 NOTE — Telephone Encounter (Addendum)
Per Dr. Lorin PicketScott- give 40 tablets only. Patient will have surgery next week by ortho and they will handle pain management at that point. Patient was notified.

## 2015-12-01 NOTE — Telephone Encounter (Signed)
Regarding out-patient surgery at [facility] 12/06/15, per primary insurer, Medicare - no pre-authorization required, per Medicare guidelines. Per current secondary payor, [employer], follow Medicare guidelines, however, per Representative [place] I, unable to identify if this is patient's plan for 2017.  Notified patient new insurance cards needed, and pend, until 12/04/15.

## 2015-12-05 NOTE — H&P (Signed)
MRI review and preop for surgery  Chief complaint right knee pain  History patient felt a pop in his knee several weeks ago. He does complain of right knee pain primarily medially significant and severe of several weeks duration as stated.  No real trauma just when he got up he felt a pop Patient had MRI shows torn bucket-handle meniscal tear medial meniscus    Past Medical History   Diagnosis  Date   .  Diabetes mellitus     .  COPD (chronic obstructive pulmonary disease) (HCC)     .  Sleep apnea     .  Asthma     .  Chronic pain of left knee     .  Chronic back pain     .  Shortness of breath     .  GERD (gastroesophageal reflux disease)     .  Arthritis      Past Surgical History   Procedure  Laterality  Date   .  Carpal tunnel release  Left     .  Shoulder arthrocentesis  Right     .  Vasectomy       .  Hernia repair  Bilateral     .  Knee arthroscopy with medial menisectomy  Left  04/22/2014       Procedure: LEFT KNEE ARTHROSCOPY WITH MEDIAL MENISECTOMY;  Surgeon: Vickki HearingStanley E Sophiana Milanese, MD;  Location: [facility];  Service: Orthopedics;  Laterality: Left;    Social History   Substance Use Topics   .  Smoking status:  Current Every Day Smoker -- 1.50 packs/day for 20 years       Types:  Cigarettes       Start date:  12/17/1993   .  Smokeless tobacco:  Current User   .  Alcohol Use:  No    Family History   Problem  Relation  Age of Onset   .  Emphysema  Maternal Grandfather     .  Emphysema  Maternal Grandmother     .  Clotting disorder  Maternal Grandmother      BP 135/90 mmHg  Ht 5\' 10"  (1.778 m)  Wt 273 lb (123.832 kg)  BMI 39.17 kg/m2 Obesity Normal grooming hygiene oriented 3 mood normal gait supported by a cane Skin normal Normal sensation in the right leg Pulses normal in the right leg Motor exam grade 5 motor strength right leg demonstrate stable right knee knee range of motion 120. Medial joint line positive McMurray sign.   upper extremities show no  contracture subluxation atrophy tremor neurovascular deficit   left knee is normal   MRI right knee   IMPRESSION: 1. Bucket-handle tear of the medial meniscus flipped towards the intercondylar notch. 2. 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 3. Partial thickness cartilage loss the medial femoral condyle and medial tibial plateau. 4. Moderate joint effusion.     Electronically Signed   By: Elige KoHetal  Patel   Summation of our discussion  You have decided to proceed with operative arthroscopy of the knee. You have decided not to continue with nonoperative measures such as but not limited to oral medication, weight loss, activity modification, physical therapy, bracing, or injection.  We will perform operative arthroscopy of the knee. Some of the risks associated with arthroscopic surgery of the knee include but are not limited to Bleeding Infection Swelling Stiffness Blood clot Pain  If you're not comfortable with these risks and would like  to continue with nonoperative treatment please let Dr. Romeo Apple know prior to your surgery.  Arthroscopy right knee partial medial meniscectomy  6 weeks recovery.   On: 11/14/2015 08:26

## 2015-12-06 ENCOUNTER — Ambulatory Visit (HOSPITAL_COMMUNITY)
Admission: RE | Admit: 2015-12-06 | Discharge: 2015-12-06 | Disposition: A | Discharge status: Home / Self Care | Payer: Medicare Other | Source: Ambulatory Visit | Attending: Orthopedic Surgery | Admitting: Orthopedic Surgery

## 2015-12-06 ENCOUNTER — Ambulatory Visit (HOSPITAL_COMMUNITY): Payer: Medicare Other | Admitting: Anesthesiology

## 2015-12-06 ENCOUNTER — Encounter (HOSPITAL_COMMUNITY)
Admission: RE | Disposition: A | Discharge status: Home / Self Care | Payer: Self-pay | Source: Ambulatory Visit | Attending: Orthopedic Surgery

## 2015-12-06 DIAGNOSIS — F17210 Nicotine dependence, cigarettes, uncomplicated: Secondary | ICD-10-CM | POA: Diagnosis not present

## 2015-12-06 DIAGNOSIS — K219 Gastro-esophageal reflux disease without esophagitis: Secondary | ICD-10-CM | POA: Diagnosis not present

## 2015-12-06 DIAGNOSIS — J449 Chronic obstructive pulmonary disease, unspecified: Secondary | ICD-10-CM | POA: Diagnosis not present

## 2015-12-06 DIAGNOSIS — F418 Other specified anxiety disorders: Secondary | ICD-10-CM | POA: Diagnosis not present

## 2015-12-06 DIAGNOSIS — S83211D Bucket-handle tear of medial meniscus, current injury, right knee, subsequent encounter: Secondary | ICD-10-CM | POA: Diagnosis not present

## 2015-12-06 DIAGNOSIS — M25461 Effusion, right knee: Secondary | ICD-10-CM | POA: Diagnosis not present

## 2015-12-06 DIAGNOSIS — S83200A Bucket-handle tear of unspecified meniscus, current injury, right knee, initial encounter: Secondary | ICD-10-CM | POA: Insufficient documentation

## 2015-12-06 DIAGNOSIS — Z6839 Body mass index (BMI) 39.0-39.9, adult: Secondary | ICD-10-CM | POA: Insufficient documentation

## 2015-12-06 DIAGNOSIS — E668 Other obesity: Secondary | ICD-10-CM | POA: Diagnosis not present

## 2015-12-06 DIAGNOSIS — E119 Type 2 diabetes mellitus without complications: Secondary | ICD-10-CM | POA: Insufficient documentation

## 2015-12-06 DIAGNOSIS — G4730 Sleep apnea, unspecified: Secondary | ICD-10-CM | POA: Diagnosis not present

## 2015-12-06 DIAGNOSIS — M23231 Derangement of other medial meniscus due to old tear or injury, right knee: Secondary | ICD-10-CM | POA: Diagnosis not present

## 2015-12-06 HISTORY — PX: KNEE ARTHROSCOPY WITH MEDIAL MENISECTOMY: SHX5651

## 2015-12-06 LAB — GLUCOSE, CAPILLARY
GLUCOSE-CAPILLARY: 295 mg/dL — AB (ref 65–99)
Glucose-Capillary: 268 mg/dL — ABNORMAL HIGH (ref 65–99)

## 2015-12-06 SURGERY — ARTHROSCOPY, KNEE, WITH MEDIAL MENISCECTOMY
Anesthesia: General | Site: Knee | Laterality: Right

## 2015-12-06 MED ORDER — VANCOMYCIN HCL 10 G IV SOLR
1500.0000 mg | INTRAVENOUS | Status: AC
  Administered 2015-12-06: 1500 mg via INTRAVENOUS
  Filled 2015-12-06: qty 1500

## 2015-12-06 MED ORDER — BUPIVACAINE-EPINEPHRINE (PF) 0.5% -1:200000 IJ SOLN
INTRAMUSCULAR | Status: AC
Start: 2015-12-06 — End: 2015-12-06
  Filled 2015-12-06: qty 60

## 2015-12-06 MED ORDER — GLYCOPYRROLATE 0.2 MG/ML IJ SOLN
0.2000 mg | Freq: Once | INTRAMUSCULAR | Status: AC
  Administered 2015-12-06: 0.2 mg via INTRAVENOUS

## 2015-12-06 MED ORDER — CHLORHEXIDINE GLUCONATE 4 % EX LIQD: 60.0000 mL | Freq: Once | CUTANEOUS | Status: DC

## 2015-12-06 MED ORDER — LACTATED RINGERS IV SOLN
INTRAVENOUS | Status: DC
  Administered 2015-12-06: 11:00:00 via INTRAVENOUS

## 2015-12-06 MED ORDER — MIDAZOLAM HCL 2 MG/2ML IJ SOLN
INTRAMUSCULAR | Status: AC
  Filled 2015-12-06: qty 2

## 2015-12-06 MED ORDER — PROPOFOL 10 MG/ML IV BOLUS
INTRAVENOUS | Status: DC | PRN
  Administered 2015-12-06: 200 mg via INTRAVENOUS
  Administered 2015-12-06: 30 mg via INTRAVENOUS

## 2015-12-06 MED ORDER — SODIUM CHLORIDE 0.9 % IR SOLN
Status: DC | PRN
  Administered 2015-12-06 (×4): 3000 mL

## 2015-12-06 MED ORDER — FENTANYL CITRATE (PF) 100 MCG/2ML IJ SOLN: 25.0000 ug | INTRAMUSCULAR | Status: DC | PRN

## 2015-12-06 MED ORDER — GLYCOPYRROLATE 0.2 MG/ML IJ SOLN
INTRAMUSCULAR | Status: AC
  Filled 2015-12-06: qty 1

## 2015-12-06 MED ORDER — FENTANYL CITRATE (PF) 100 MCG/2ML IJ SOLN
INTRAMUSCULAR | Status: DC | PRN
  Administered 2015-12-06: 25 ug via INTRAVENOUS
  Administered 2015-12-06: 50 ug via INTRAVENOUS
  Administered 2015-12-06 (×3): 25 ug via INTRAVENOUS

## 2015-12-06 MED ORDER — BUPIVACAINE-EPINEPHRINE (PF) 0.5% -1:200000 IJ SOLN
INTRAMUSCULAR | Status: DC | PRN
Start: 2015-12-06 — End: 2015-12-06
  Administered 2015-12-06: 60 mL via PERINEURAL

## 2015-12-06 MED ORDER — ONDANSETRON HCL 4 MG/2ML IJ SOLN
INTRAMUSCULAR | Status: AC
Start: 2015-12-06 — End: 2015-12-06
  Filled 2015-12-06: qty 2

## 2015-12-06 MED ORDER — SODIUM CHLORIDE 0.9 % IR SOLN
Status: DC | PRN
Start: 2015-12-06 — End: 2015-12-06
  Administered 2015-12-06: 1000 mL

## 2015-12-06 MED ORDER — SEVOFLURANE IN SOLN
RESPIRATORY_TRACT | Status: AC
  Filled 2015-12-06: qty 250

## 2015-12-06 MED ORDER — FENTANYL CITRATE (PF) 100 MCG/2ML IJ SOLN
INTRAMUSCULAR | Status: AC
  Filled 2015-12-06: qty 2

## 2015-12-06 MED ORDER — ONDANSETRON HCL 4 MG/2ML IJ SOLN: 4.0000 mg | Freq: Once | INTRAMUSCULAR | Status: DC | PRN

## 2015-12-06 MED ORDER — ALBUTEROL SULFATE (2.5 MG/3ML) 0.083% IN NEBU
2.5000 mg | INHALATION_SOLUTION | Freq: Once | RESPIRATORY_TRACT | Status: AC
  Administered 2015-12-06: 2.5 mg via RESPIRATORY_TRACT

## 2015-12-06 MED ORDER — SODIUM CHLORIDE 0.9 % IN NEBU
INHALATION_SOLUTION | RESPIRATORY_TRACT | Status: AC
Start: 2015-12-06 — End: 2015-12-06
  Filled 2015-12-06: qty 3

## 2015-12-06 MED ORDER — EPINEPHRINE HCL 1 MG/ML IJ SOLN
INTRAMUSCULAR | Status: AC
  Filled 2015-12-06: qty 5

## 2015-12-06 MED ORDER — ALBUTEROL SULFATE (2.5 MG/3ML) 0.083% IN NEBU
INHALATION_SOLUTION | RESPIRATORY_TRACT | Status: AC
Start: 2015-12-06 — End: 2015-12-06
  Filled 2015-12-06: qty 3

## 2015-12-06 MED ORDER — PROPOFOL 10 MG/ML IV BOLUS
INTRAVENOUS | Status: AC
  Filled 2015-12-06: qty 40

## 2015-12-06 MED ORDER — FENTANYL CITRATE (PF) 250 MCG/5ML IJ SOLN
INTRAMUSCULAR | Status: AC
  Filled 2015-12-06: qty 5

## 2015-12-06 MED ORDER — FENTANYL CITRATE (PF) 100 MCG/2ML IJ SOLN
25.0000 ug | INTRAMUSCULAR | Status: AC
  Administered 2015-12-06 (×2): 25 ug via INTRAVENOUS

## 2015-12-06 MED ORDER — MIDAZOLAM HCL 2 MG/2ML IJ SOLN
1.0000 mg | INTRAMUSCULAR | Status: DC | PRN
  Administered 2015-12-06: 2 mg via INTRAVENOUS

## 2015-12-06 MED ORDER — DEXTROSE 5 % IV SOLN: INTRAVENOUS | Status: DC | PRN

## 2015-12-06 MED ORDER — PROMETHAZINE HCL 12.5 MG PO TABS: 12.5000 mg | ORAL_TABLET | Freq: Four times a day (QID) | ORAL | Status: DC | PRN

## 2015-12-06 MED ORDER — LIDOCAINE HCL (CARDIAC) 20 MG/ML IV SOLN
INTRAVENOUS | Status: DC | PRN
  Administered 2015-12-06: 50 mg via INTRAVENOUS

## 2015-12-06 MED ORDER — ARTIFICIAL TEARS OP OINT
TOPICAL_OINTMENT | OPHTHALMIC | Status: AC
  Filled 2015-12-06: qty 7

## 2015-12-06 MED ORDER — OXYCODONE-ACETAMINOPHEN 5-325 MG PO TABS: 1.0000 | ORAL_TABLET | ORAL | Status: DC | PRN

## 2015-12-06 MED ORDER — SODIUM CHLORIDE 0.9 % IV SOLN
INTRAVENOUS | Status: DC | PRN
  Administered 2015-12-06: 11:00:00 via INTRAVENOUS

## 2015-12-06 SURGICAL SUPPLY — 58 items
ARTHROWAND PARAGON T2 (SURGICAL WAND)
BAG HAMPER (MISCELLANEOUS) ×3 IMPLANT
BANDAGE ELASTIC 6 VELCRO NS (GAUZE/BANDAGES/DRESSINGS) ×3 IMPLANT
BLADE AGGRESSIVE PLUS 4.0 (BLADE) ×3 IMPLANT
BLADE SURG SZ11 CARB STEEL (BLADE) ×3 IMPLANT
BUR AGGRESSIVE PLUS 5.0 (BURR) ×2 IMPLANT
CHLORAPREP W/TINT 26ML (MISCELLANEOUS) ×6 IMPLANT
CLOTH BEACON ORANGE TIMEOUT ST (SAFETY) ×3 IMPLANT
COOLER CRYO IC GRAV AND TUBE (ORTHOPEDIC SUPPLIES) ×3 IMPLANT
CUFF CRYO KNEE LG 20X31 COOLER (ORTHOPEDIC SUPPLIES) ×2 IMPLANT
CUFF CRYO KNEE18X23 MED (MISCELLANEOUS) IMPLANT
CUFF TOURNIQUET SINGLE 34IN LL (TOURNIQUET CUFF) ×2 IMPLANT
CUFF TOURNIQUET SINGLE 44IN (TOURNIQUET CUFF) IMPLANT
CUTTER ANGLED DBL BITE 4.5 (BURR) IMPLANT
DECANTER SPIKE VIAL GLASS SM (MISCELLANEOUS) ×6 IMPLANT
GAUZE SPONGE 4X4 12PLY STRL (GAUZE/BANDAGES/DRESSINGS) ×2 IMPLANT
GAUZE SPONGE 4X4 16PLY XRAY LF (GAUZE/BANDAGES/DRESSINGS) ×3 IMPLANT
GAUZE XEROFORM 5X9 LF (GAUZE/BANDAGES/DRESSINGS) ×3 IMPLANT
GLOVE BIOGEL M 7.0 STRL (GLOVE) ×2 IMPLANT
GLOVE BIOGEL PI IND STRL 7.0 (GLOVE) ×1 IMPLANT
GLOVE BIOGEL PI INDICATOR 7.0 (GLOVE) ×2
GLOVE EXAM NITRILE MD LF STRL (GLOVE) ×2 IMPLANT
GLOVE SKINSENSE NS SZ8.0 LF (GLOVE) ×2
GLOVE SKINSENSE STRL SZ8.0 LF (GLOVE) ×1 IMPLANT
GLOVE SS N UNI LF 8.5 STRL (GLOVE) ×3 IMPLANT
GOWN STRL REUS W/ TWL LRG LVL3 (GOWN DISPOSABLE) ×1 IMPLANT
GOWN STRL REUS W/TWL LRG LVL3 (GOWN DISPOSABLE) ×3
GOWN STRL REUS W/TWL XL LVL3 (GOWN DISPOSABLE) ×3 IMPLANT
HLDR LEG FOAM (MISCELLANEOUS) ×1 IMPLANT
IV NS IRRIG 3000ML ARTHROMATIC (IV SOLUTION) ×10 IMPLANT
KIT BLADEGUARD II DBL (SET/KITS/TRAYS/PACK) ×3 IMPLANT
KIT ROOM TURNOVER AP CYSTO (KITS) ×3 IMPLANT
LEG HOLDER FOAM (MISCELLANEOUS) ×2
MANIFOLD NEPTUNE II (INSTRUMENTS) ×3 IMPLANT
MARKER SKIN DUAL TIP RULER LAB (MISCELLANEOUS) ×3 IMPLANT
NDL HYPO 18GX1.5 BLUNT FILL (NEEDLE) ×1 IMPLANT
NDL HYPO 21X1.5 SAFETY (NEEDLE) ×1 IMPLANT
NDL SPNL 18GX3.5 QUINCKE PK (NEEDLE) ×1 IMPLANT
NEEDLE HYPO 18GX1.5 BLUNT FILL (NEEDLE) ×3 IMPLANT
NEEDLE HYPO 21X1.5 SAFETY (NEEDLE) ×3 IMPLANT
NEEDLE SPNL 18GX3.5 QUINCKE PK (NEEDLE) ×3 IMPLANT
NS IRRIG 1000ML POUR BTL (IV SOLUTION) ×3 IMPLANT
PACK ARTHRO LIMB DRAPE STRL (MISCELLANEOUS) ×3 IMPLANT
PAD ABD 5X9 TENDERSORB (GAUZE/BANDAGES/DRESSINGS) ×3 IMPLANT
PAD ARMBOARD 7.5X6 YLW CONV (MISCELLANEOUS) ×3 IMPLANT
PADDING CAST COTTON 6X4 STRL (CAST SUPPLIES) ×3 IMPLANT
PADDING WEBRIL 6 STERILE (GAUZE/BANDAGES/DRESSINGS) ×2 IMPLANT
SET ARTHROSCOPY INST (INSTRUMENTS) ×3 IMPLANT
SET ARTHROSCOPY PUMP TUBE (IRRIGATION / IRRIGATOR) ×3 IMPLANT
SET BASIN LINEN APH (SET/KITS/TRAYS/PACK) ×3 IMPLANT
SPONGE GAUZE 4X4 12PLY (GAUZE/BANDAGES/DRESSINGS) ×2 IMPLANT
SUT ETHILON 3 0 FSL (SUTURE) IMPLANT
SYR 30ML LL (SYRINGE) ×3 IMPLANT
SYRINGE 10CC LL (SYRINGE) ×3 IMPLANT
WAND 50 DEG COVAC W/CORD (SURGICAL WAND) ×2 IMPLANT
WAND 90 DEG TURBOVAC W/CORD (SURGICAL WAND) IMPLANT
WAND ARTHRO PARAGON T2 (SURGICAL WAND) IMPLANT
YANKAUER SUCT BULB TIP 10FT TU (MISCELLANEOUS) ×9 IMPLANT

## 2015-12-06 NOTE — Anesthesia Procedure Notes (Signed)
Procedure Name: LMA Insertion Date/Time: 12/06/2015 11:16 AM Performed by: Pernell DupreADAMS, AMY A Pre-anesthesia Checklist: Timeout performed, Patient identified, Emergency Drugs available, Suction available and Patient being monitored Patient Re-evaluated:Patient Re-evaluated prior to inductionOxygen Delivery Method: Circle system utilized Preoxygenation: Pre-oxygenation with 100% oxygen Intubation Type: IV induction Ventilation: Mask ventilation without difficulty LMA: LMA inserted LMA Size: 4.0 Number of attempts: 1 Placement Confirmation: positive ETCO2 and breath sounds checked- equal and bilateral Tube secured with: Tape Dental Injury: Teeth and Oropharynx as per pre-operative assessment

## 2015-12-06 NOTE — Interval H&P Note (Signed)
History and Physical Interval Note:  12/06/2015 10:46 AM  Joseph [name]  has presented today for surgery, with the diagnosis of medial meniscus tear right knee  The various methods of treatment have been discussed with the patient and family. After consideration of risks, benefits and other options for treatment, the patient has consented to  Procedure(s): KNEE ARTHROSCOPY WITH MEDIAL MENISECTOMY (Right) as a surgical intervention .  The patient's history has been reviewed, patient examined, no change in status, stable for surgery.  I have reviewed the patient's chart and labs.  Questions were answered to the patient's satisfaction.     Joseph [name]

## 2015-12-06 NOTE — Op Note (Signed)
12/06/2015 DR HARRISON   PRE-OPERATIVE DIAGNOSIS:  medial meniscus tear right knee  POST-OPERATIVE DIAGNOSIS:  medial meniscus tear right knee  Bucket handle tear medial meniscus  Normal acl, pcl, lateral and patellofemoral joints   PROCEDURE:  Procedure(s): RIGHT KNEE ARTHROSCOPY WITH MEDIAL MENISECTOMY (Right) Knee arthroscopy dictation  The patient was identified in the preoperative holding area using 2 approved identification mechanisms. The chart was reviewed and updated. The surgical site was confirmed and marked with an indelible marker.  The patient was taken to the operating room for anesthesia. After successful  GENERAL  anesthesia, VANCOMYCIN was used as IV antibiotics.  The patient was placed in the supine position with the (RIGHT) operative extremity in an arthroscopic leg holder and the opposite extremity in a padded leg holder.  The timeout was executed.  A lateral portal was established with an 11 blade and the scope was introduced into the joint. A diagnostic arthroscopy was performed in circumferential manner examining the entire knee joint. A medial portal was established and the diagnostic arthroscopy was repeated using a probe to palpate intra-articular structures as they were encountered.   The operative findings are   BUCKET HANDLE TEAR MEDIAL MENISCUS FLIPPED INTO THE NOTCH  The MEDIAL  meniscus was resected using a duckbill forceps. The meniscal fragments were removed with a motorized shaver. The meniscus was balanced with a combination of a motorized shaver and a 50 ArthroCare wand until a stable rim was obtained.   The arthroscopic pump was placed on the wash mode and any excess debris was removed from the joint using suction.  60 cc of Marcaine with epinephrine was injected through the arthroscope.  The portals were closed with 3-0 nylon suture.  A sterile bandage, Ace wrap and Cryo/Cuff was placed and the Cryo/Cuff was activated. The patient was taken  to the recovery room in stable condition.

## 2015-12-06 NOTE — Brief Op Note (Signed)
12/06/2015  11:57 AM  PATIENT:  Joseph [name]  45 y.o. male  PRE-OPERATIVE DIAGNOSIS:  medial meniscus tear right knee  POST-OPERATIVE DIAGNOSIS:  medial meniscus tear right knee  Bucket handle tear medial meniscus  Normal acl, pcl, lateral and patellofemoral joints   PROCEDURE:  Procedure(s): RIGHT KNEE ARTHROSCOPY WITH MEDIAL MENISECTOMY (Right)  SURGEON:  Surgeon(s) and Role:    * Vickki HearingStanley E Keyante Durio, MD - Primary  PHYSICIAN ASSISTANT:   ASSISTANTS: none   ANESTHESIA:   general  EBL:  Total I/O In: 200 [I.V.:200] Out: 5 [Blood:5]  BLOOD ADMINISTERED:none  DRAINS: none   LOCAL MEDICATIONS USED:  MARCAINE     SPECIMEN:  No Specimen  DISPOSITION OF SPECIMEN:  N/A  COUNTS:  YES  TOURNIQUET:    DICTATION: .Dragon Dictation  PLAN OF CARE: Discharge to home after PACU  PATIENT DISPOSITION:  PACU - hemodynamically stable.   Delay start of Pharmacological VTE agent (>24hrs) due to surgical blood loss or risk of bleeding: not applicable

## 2015-12-06 NOTE — Anesthesia Postprocedure Evaluation (Signed)
Anesthesia Post Note Late Entry  Patient: Ginny ForthJerry R Novelo  Procedure(s) Performed: Procedure(s) (LRB): RIGHT KNEE ARTHROSCOPY WITH MEDIAL MENISECTOMY (Right)  Patient location during evaluation: PACU Anesthesia Type: General Level of consciousness: awake and alert and oriented Pain management: pain level controlled Vital Signs Assessment: post-procedure vital signs reviewed and stable Respiratory status: spontaneous breathing and respiratory function stable Cardiovascular status: stable Postop Assessment: no signs of nausea or vomiting Anesthetic complications: no    Last Vitals:  Filed Vitals:   12/06/15 1245 12/06/15 1251  BP: 128/86 127/87  Pulse: 101 99  Temp:  36.9 C  Resp: 17 18    Last Pain:  Filed Vitals:   12/06/15 1252  PainSc: 0-No pain                 ADAMS, AMY A

## 2015-12-06 NOTE — Progress Notes (Addendum)
Non productive forceful cough noted.  85% on room air.  Placed on O2 @ 2 liters and contacted Dr Jayme CloudGonzalez for assessment.  Patient recovered and was back to baseline on room air prior to discharge.

## 2015-12-06 NOTE — Anesthesia Preprocedure Evaluation (Addendum)
Anesthesia Evaluation  Patient identified by MRN, date of birth, ID band Patient awake    Reviewed: Allergy & Precautions, H&P , NPO status , Patient's Chart, lab work & pertinent test results  History of Anesthesia Complications (+) history of anesthetic complications (severe coughing after emergence due to smoking & COPD)  Airway Mallampati: II  TM Distance: >3 FB   Mouth opening: Limited Mouth Opening  Dental  (+) Teeth Intact, Partial Upper   Pulmonary shortness of breath and with exertion, asthma , sleep apnea and Continuous Positive Airway Pressure Ventilation , COPD,  COPD inhaler, Current Smoker,    breath sounds clear to auscultation- rhonchi       Cardiovascular  Rhythm:Regular Rate:Normal     Neuro/Psych PSYCHIATRIC DISORDERS Anxiety Depression    GI/Hepatic GERD  Medicated and Controlled,  Endo/Other  diabetes, Poorly Controlled, Type 2, Oral Hypoglycemic AgentsMorbid obesity  Renal/GU      Musculoskeletal   Abdominal   Peds  Hematology   Anesthesia Other Findings   Reproductive/Obstetrics                             Anesthesia Physical Anesthesia Plan  ASA: III  Anesthesia Plan: General   Post-op Pain Management:    Induction: Intravenous  Airway Management Planned: LMA  Additional Equipment:   Intra-op Plan:   Post-operative Plan: Extubation in OR  Informed Consent: I have reviewed the patients History and Physical, chart, labs and discussed the procedure including the risks, benefits and alternatives for the proposed anesthesia with the patient or authorized representative who has indicated his/her understanding and acceptance.     Plan Discussed with:   Anesthesia Plan Comments: (GOT possibility discussed and he agrees with plan.)        Anesthesia Quick Evaluation

## 2015-12-06 NOTE — Interval H&P Note (Signed)
History and Physical Interval Note:  12/06/2015 10:50 AM  Joseph [name]  has presented today for surgery, with the diagnosis of medial meniscus tear right knee  The various methods of treatment have been discussed with the patient and family. After consideration of risks, benefits and other options for treatment, the patient has consented to  Procedure(s): KNEE ARTHROSCOPY WITH MEDIAL MENISECTOMY (Right) as a surgical intervention .  The patient's history has been reviewed, patient examined, no change in status, stable for surgery.  I have reviewed the patient's chart and labs.  Questions were answered to the patient's satisfaction.     Fuller CanadaStanley Harrison

## 2015-12-06 NOTE — Transfer of Care (Signed)
Immediate Anesthesia Transfer of Care Note  Patient: Joseph [name]  Procedure(s) Performed: Procedure(s): RIGHT KNEE ARTHROSCOPY WITH MEDIAL MENISECTOMY (Right)  Patient Location: PACU  Anesthesia Type:General  Level of Consciousness: awake, oriented and patient cooperative  Airway & Oxygen Therapy: Patient Spontanous Breathing and Patient connected to face mask oxygen  Post-op Assessment: Report given to RN and Post -op Vital signs reviewed and stable  Post vital signs: Reviewed and stable  Last Vitals:  Filed Vitals:   12/06/15 1057 12/06/15 1100  BP: 124/76 124/76  Pulse:    Temp:    Resp: 13 24    Complications: No apparent anesthesia complications

## 2015-12-07 ENCOUNTER — Telehealth: Payer: Self-pay | Admitting: Orthopedic Surgery

## 2015-12-07 ENCOUNTER — Encounter (HOSPITAL_COMMUNITY): Payer: Self-pay | Admitting: Orthopedic Surgery

## 2015-12-07 NOTE — Telephone Encounter (Signed)
ROUTING TO DR HARRISON TO ADVISE 

## 2015-12-07 NOTE — Telephone Encounter (Signed)
Patient called to relay that the pain medication prescribed following his knee surgery yesterday, 12/06/15, is not helping with the pain.  States "this is the same medication already was using."   oxyCODONE-acetaminophen (ROXICET) 5-325 MG tablet [[id number]][[id number]]  Please advise patient.  He gave cell ph# of [phone]

## 2015-12-07 NOTE — Telephone Encounter (Signed)
Try 2 a t a time that's twice as much

## 2015-12-08 NOTE — Telephone Encounter (Signed)
Patient aware.

## 2015-12-11 ENCOUNTER — Ambulatory Visit (INDEPENDENT_AMBULATORY_CARE_PROVIDER_SITE_OTHER): Payer: BLUE CROSS/BLUE SHIELD | Admitting: Orthopedic Surgery

## 2015-12-11 VITALS — BP 147/101 | Ht 70.0 in | Wt 268.0 lb

## 2015-12-11 DIAGNOSIS — Z9889 Other specified postprocedural states: Secondary | ICD-10-CM

## 2015-12-11 MED ORDER — OXYCODONE-ACETAMINOPHEN 7.5-325 MG PO TABS: 1.0000 | ORAL_TABLET | ORAL | Status: DC | PRN

## 2015-12-11 NOTE — Patient Instructions (Signed)
CALL [facility] TO SCHEDULE THERAPY VISITS 

## 2015-12-11 NOTE — Progress Notes (Signed)
Patient ID: Ginny ForthJerry R Hosie, male   DOB: August 12, 1971, 45 y.o.   MRN: [id number]   POST OP VISIT  S/P KNEE ARTHROSCOPY  Chief Complaint  Patient presents with  . Follow-up    post op 1, SARK MM, DOS 12/06/15     BP 147/101 mmHg  Ht 5\' 10"  (1.778 m)  Wt 268 lb (121.564 kg)  BMI 38.45 kg/m2  The surgical sites look clean dry and intact.   Supple calf  ASSESSMENT AND PLAN   Start physical therapy and arrange follow-up visit   PRE-OPERATIVE DIAGNOSIS:  medial meniscus tear right knee  POST-OPERATIVE DIAGNOSIS:  medial meniscus tear right knee  Bucket handle tear medial meniscus  Normal acl, pcl, lateral and patellofemoral joints   PROCEDURE:  Procedure(s): RIGHT KNEE ARTHROSCOPY WITH MEDIAL MENISECTOMY (Right)

## 2015-12-12 ENCOUNTER — Ambulatory Visit (INDEPENDENT_AMBULATORY_CARE_PROVIDER_SITE_OTHER): Payer: Medicare Other | Admitting: Internal Medicine

## 2015-12-12 ENCOUNTER — Encounter: Payer: Self-pay | Admitting: Internal Medicine

## 2015-12-12 VITALS — BP 124/82 | HR 90 | Ht 70.0 in | Wt 279.6 lb

## 2015-12-12 DIAGNOSIS — J45909 Unspecified asthma, uncomplicated: Secondary | ICD-10-CM | POA: Diagnosis not present

## 2015-12-12 DIAGNOSIS — F17210 Nicotine dependence, cigarettes, uncomplicated: Secondary | ICD-10-CM | POA: Diagnosis not present

## 2015-12-12 NOTE — Patient Instructions (Signed)
No change in medications  Keep working on getting rid of the cigarettes before they get rid of you  Please schedule a follow up office visit in 6 weeks, call sooner if needed

## 2015-12-12 NOTE — Progress Notes (Signed)
Subjective:   Patient ID: Joseph [name], male    DOB: 04/09/1971   MRN: [id number]    Brief patient profile:  44yowm active smoker without significant airflow obst by PFTs 06/02/12 and 03/08/2015    History of Present Illness  IOV 04/10/12   In April 2013 cc  had pneumonia apparently. Went to [facility] ER and found some abnormality (nodule based on review of ER notes) on CXR that resulted in CT Angio 03/07/12 same visit. PE ruled out but found to have mediastinal nodes but the cxr nodule was not there. Apparently ER doctor said it was lymphoma and asked to see an oncologist. But primary care doctor felt that was over zealous and referred patient here. He prefers conservative mgmt to nodes if clinical suspicion for cancer is low  cc lack of energy (spent most of days yesterday in bed), esp in pollen, chronic dyspnea on exertion for 180 feet relieved by rest  But slowly progressive since 2002 for which he uses albuterol prn on average 4 times a day.  Walkt test 185 feet x 3 laps: no desaturation  He smokes at baseline; trying quit, prior intolerance (dreams) with chantix.   Only baseline mild chronic cough with associtaed wheeze that is occasional.. Does have some B symptoms like nocturnal diaphoresis (soaks pillows). Denies weight loss.   CT ANGIOGRAPHY CHEST  Comparison: 03/07/2012  An abnormal right lower paratracheal node has a short axis diameter  of 1.6 cm. An abnormal AP window lymph node has a short axis  diameter of 1.4 cm. Mild bilateral hilar and infrahilar adenopathy  noted. Scattered small axillary lymph nodes are present.  A subcarinal node has a short axis diameter of 1.5 cm.     OV 08/14/2012 Mediastinal nodes: PET scan 06/10/12 shows very low uptake in the mediastinal nodes. Suspicion for cancer is very low. Lymph nodes include 13 mm right lower paratracheal lymph node and 10 mm prevascular lymph node. ACE level 04/10/12  - is in 20s and normal  Still dyspneic: 1 flught of  steps and better with rest and with albuterol and atrovent prn. Dyspne also worse in winter and cooler weathers. Rates it as moderate. STable since last visit to slightly worse. thre is some associtaed cough. No asociated weight loss. COugh is moderate and worst early in morning and in cooler temperatues.. Denies sinus drainage. PFts show mixed picture - fvc 3.4L/69%, fev1 2.9/76%, Ratio 85 but 12% bd respose. TLC 5.7/83%. DLCO 23.7/69% rec rec #MEdiastinal node  - suspicion for cancer is low  - you will need CT chest in a year; we will schedule it later  #Smoking  - glad you are working on quitting  #Shortness of breath - this is due to asthma/copd and weight  - please start QVAR 2 puff twice daily - take sample and show technique and take prescription too  - focus on weight loss through the low glycemic diet; take diet sheet from [place]  #FOllowup  - 3 months with spirometry at followup  - flu shot and pneumovax through PMD as discussed   12/17/2013  acute ov/Wert still active smoker re: recurrent pattern of coughing x a decade intermittent / out of control since nov 2014/ can't take symbicort makes it worse / on percocet one tid at baseline  Chief Complaint  Patient presents with  . Acute Visit    MR pt.  C/o mostly nonprod cough with some thick white mucous, coughing until he almost passes out.  [facility] to [employer] [facility] [facility] last week.    duoneb daily  Sometime skips the doses later in the day but always uses the am dose, sputum to purulent. Mostly sob with cough/ otherwise breaths ok Last neb was 4 h prior to OV   rec For Cough - use the flutter valve as much as you can plus mucinex dm 1200 mg every 12 hours as needed supplement with percocet up to 2 every 4hours For Breathing  Only use your duoneb  rescue medication . - Ok to use up to    every 4 hours if you must  Prednisone 10 mg take  4 each am x 2 days,   2 each am x 2 days,  1 each am x 2 days and stop  Pantoprazole (protonix) 40 mg    Take 30-60 min before first meal of the day and Pepcid 20 mg one bedtime until return to office - this is the best way to tell whether stomach acid is contributing to your problem.  GERD  Diet . Please schedule a follow up office visit in 2 weeks, sooner if needed with all meds in hand  07/27/2014 f/u ov/Wert re: asthma / still smoking  Chief Complaint  Patient presents with  . Follow-up    Pt c/o increased SOB and cough for the past month- cough is occ prod with minimal clear sputum. Pt currently taking clindamycin for staph infection. Rarely using albuterol inhaler, but is using neb approx 4 times per day.   off dulera x 1.5 week denies more sob/ cough or need for rescue on vs off rec Plan A=  dulera 200 Take 2 puffs first thing in am and then another 2 puffs about 12 hours later (or formulary alternative- call us if you find a cheaper one)  Plan B =  Backup = proaire  Plan C = Backup plan B = nebulized albuterol up to 4 hours but call if needing this much at all The key is to stop smoking completely before smoking completely stops you!    11/29/2014 f/u ov/Wert re: s/p "aecopd" still smoking  Chief Complaint  Patient presents with  . Follow-up    Pt states that his SOB and cough have been worse for the past several wks. He went to ED for COPD exacerbation on 11/14/14.  He is using proair 3-4 times per day and neb about 3 times per day on average.   one month prior to flare needing proair 1-2 per day and neb at least once or twice also - using neb first thing instead of dulera  Better p pred from er then worse again w/in a week of taper off assoc with congested cough but mostly white mucus esp in ams rec Think of your respiratory medications as multiple steps you can take to control your symptoms and avoid having to go to the ER Plan A is your maintenance daily no matter what meds: dulera Take 2 puffs first thing in am and then another 2 puffs about 12 hours later.  Plan B only use after  you've used your maintenance (Plan A) medication, and only if you can't catch your breath: Proair 2 every 4 hours as needed  Plan C only use after you've used plan A and B and still can't catch your breath: nebulizer albuterol 2.5 mg up to every 4 hours  Plan D(for Doctor):  If you've used A thru C and not doing a lot better or still needing C more  than a once a day,  D = call the doctor for evaluation asap Plan E (for ER):  If still not able to catch your breath, even after using your nebulizer up to every 4 hours, go to ER  For cough > delsym 2 tsp every 12 hours is the best you can buy Prevacid 30 mg Take 30-60 min before first meal of the day  GERD diet      03/08/2015 f/u ov/Wert re: still smoking / off dulera x 48 h and off saba hfa/ using neb 8 am of test /no change in symptoms or need for saba on or off dulera  Chief Complaint  Patient presents with  . Follow-up    PFT done today. Pt states that his breathing is about the same since last visit. He is using albuterol inhaler approx 3 x per day.   breathing does better if stays in house,  worse out in cold Even on dulera still using neb at least twice daily  Some sporadic dry fits of  cough with bending over even on dulera but not using ppi correctly  rec Prevacid 30 mg should be to Take 30- 60 min before your first and last meals of the day  I see no evidence that the dulera is helping you and would stop it at this point pending an asthma challenge test  Please see patient coordinator before you leave today  to schedule Methacholine test - do not take any albuterol with in 6 hours of the test> never done     10/03/2015  f/u ov/Wert re: still smoking /  ? asthma vs all vcd/ gerd  Chief Complaint  Patient presents with  . Follow-up    Pt states his breathing is overall doing well. He is using rescue inhaler and neb several times per day.   Wife left him in July 2016 and losing wt, dm better, breathing not since stopped dulera and  never went for mct and can't afford it  No noct symptoms after hs saba/ cpap  saba use up when no access to dulera 200 but even on it he continues to perceive need for saba daily despite nl exams/ pfts rec Please see patient coordinator before you leave today  to schedule a methacholine challenge test>  POSITIVE Ok to resume dulera but stop it 24 hours before the methacholine and no albuterol w/in 6 hours  Please schedule a follow up office visit in 4 weeks, sooner if needed     10/31/2015  f/u ov/Wert re: mild persistent asthma  Chief Complaint  Patient presents with  . Follow-up    Breathing has been worse [place]  He is using rescue inhaler 4-5 x per day and duoneb 2 x per day.   Noted a change on the dulera 100 but can't afford dulera in any form/ reliant on samples rec Increase the dulera 200 Take 2 puffs first thing in am and then another 2 puffs about 12 hours later.  Only use your albuterol (proair) as a rescue medication   Only use the nebulizer if you try the Proair first and it doesn't work    12/12/2015  f/u ov/Wert re: intrinsic asthma  Doing fine as long as stays out of cold and on dulera 200 despite still smoking   Chief Complaint  Patient presents with  . Follow-up    Breathing has improved some. He is using rescue inhaler "just when I go outside". He uses neb  5 x per wk on average.     No obvious patterns in day to day or daytime variabilty or excess/ purulent sputum  or assoc cp or  subjective wheeze overt sinus or hb symptoms. No unusual exp hx or h/o childhood pna/ asthma or knowledge of premature birth.  Sleeping ok without nocturnal  or early am exacerbation  of respiratory  c/o's or need for noct saba. Also denies any obvious fluctuation of symptoms with weather or environmental changes or other aggravating or alleviating factors except as outlined above   Current Medications, Allergies, Complete Past Medical History, Past Surgical History,  Family History, and Social History were reviewed in [facility] electronic medical record.  ROS  The following are not active complaints unless bolded sore throat, dysphagia, dental problems, itching, sneezing,  nasal congestion or excess/ purulent secretions, ear ache,   fever, chills, sweats, unintended wt loss, pleuritic or exertional cp, hemoptysis,  orthopnea pnd or leg swelling, presyncope, palpitations, heartburn, abdominal pain, anorexia, nausea, vomiting, diarrhea  or change in bowel or urinary habits, change in stools or urine, dysuria,hematuria,  rash, arthralgias, visual complaints, headache, numbness weakness or ataxia or problems with walking or coordination,  change in mood/affect or memory.        Objective:   Physical Exam  amb obese wm nad  / vital signs reviewed  12/31/2013  294  >    07/27/2014  286 > 11/29/2014   289> 03/08/2015  284 > 10/03/2015 266 > 10/31/2015 273 > 12/12/2015    280      HEENT: nl dentition, turbinates, and oropharynx. Nl external ear canals without cough reflex   NECK :  without JVD/Nodes/TM/ nl carotid upstrokes bilaterally   LUNGS: no acc muscle use, clear to a and P bilaterally    CV:  RRR  no s3 or murmur or increase in P2, no edema   ABD:  soft and nontender with nl excursion in the supine position. No bruits or organomegaly, bowel sounds nl  MS:  warm without deformities, calf tenderness, cyanosis or clubbing  SKIN: warm and dry without lesions    NEURO:  alert, approp, no deficits              Assessment & Plan:

## 2015-12-17 ENCOUNTER — Encounter: Payer: Self-pay | Admitting: Internal Medicine

## 2015-12-17 NOTE — Assessment & Plan Note (Signed)
-  PFT's s sign copd 06/2012 - med calendar 2/13 /15 > not using 11/29/14  - 11/29/2014 p extensive coaching HFA effectiveness =    90% from a baseline of 50%  -PFTs wnl 03/08/2015 4 h p alb neb> rec MCT at least 6 h p neb > done 10/12/2015  POS > resume dulera 100 2bid - 10/31/2015  extensive coaching HFA effectiveness =    90% > increase dulera to 200 bid (samples provided)   Most goals of chronic asthma control met including optimal function and elimination of symptoms  But still excess  need for rescue therapy.  Contingencies discussed in full including contacting this office immediately if not controlling the symptoms using the rule of two's.    I had an extended discussion with the patient reviewing all relevant studies completed to date and  lasting 15 to 20 minutes of a 25 minute visit    Each maintenance medication was reviewed in detail including most importantly the difference between maintenance and prns and under what circumstances the prns are to be triggered using an action plan format that is not reflected in the computer generated alphabetically organized AVS.    Please see instructions for details which were reviewed in writing and the patient given a copy highlighting the part that I personally wrote and discussed at today's ov.

## 2015-12-17 NOTE — Assessment & Plan Note (Signed)
>   3 m  Counseled, not ready to commit to quit

## 2015-12-25 ENCOUNTER — Encounter: Payer: Self-pay | Admitting: *Deleted

## 2015-12-25 ENCOUNTER — Ambulatory Visit: Payer: Medicare Other | Admitting: Orthopedic Surgery

## 2015-12-30 ENCOUNTER — Other Ambulatory Visit: Payer: Self-pay | Admitting: Family Medicine

## 2016-01-08 ENCOUNTER — Encounter: Payer: Self-pay | Admitting: Orthopedic Surgery

## 2016-01-08 ENCOUNTER — Ambulatory Visit (INDEPENDENT_AMBULATORY_CARE_PROVIDER_SITE_OTHER): Payer: Self-pay | Admitting: Orthopedic Surgery

## 2016-01-08 VITALS — BP 153/94 | Ht 70.0 in | Wt 270.0 lb

## 2016-01-08 DIAGNOSIS — Z9889 Other specified postprocedural states: Secondary | ICD-10-CM

## 2016-01-08 MED ORDER — OXYCODONE-ACETAMINOPHEN 5-325 MG PO TABS: 1.0000 | ORAL_TABLET | ORAL | Status: DC | PRN

## 2016-01-08 NOTE — Progress Notes (Signed)
Patient ID: Joseph [name], male   DOB: [birth year], 45 y.o.   MRN: [id number]   POST OP VISIT  S/P KNEE ARTHROSCOPY  Chief Complaint  Patient presents with  . Follow-up    post op 2, SARK MM, DOS 12/06/15     BP 153/94 mmHg  Ht  (1.778 m)  Wt 270 lb (122.471 kg)  BMI 38.74 kg/m2  Knee looks good   FROM , no swelling    ASSESSMENT AND PLAN   SUBJECTIVE: s/p knee arthroscopy right knee   Wants to do PT at home due to fin reasons. released

## 2016-01-08 NOTE — Patient Instructions (Signed)
HOME EXERCISE PROGRAM 

## 2016-01-10 ENCOUNTER — Other Ambulatory Visit: Payer: Self-pay | Admitting: Family Medicine

## 2016-01-12 DIAGNOSIS — H25043 Posterior subcapsular polar age-related cataract, bilateral: Secondary | ICD-10-CM | POA: Diagnosis not present

## 2016-01-23 ENCOUNTER — Ambulatory Visit: Payer: Medicare Other | Admitting: Internal Medicine

## 2016-01-27 ENCOUNTER — Other Ambulatory Visit: Payer: Self-pay | Admitting: Family Medicine

## 2016-01-29 NOTE — Telephone Encounter (Signed)
Ok thre e mo worth 

## 2016-02-01 ENCOUNTER — Other Ambulatory Visit: Payer: Self-pay | Admitting: Family Medicine

## 2016-02-02 ENCOUNTER — Telehealth: Payer: Self-pay | Admitting: Pulmonary Disease

## 2016-02-02 ENCOUNTER — Ambulatory Visit (INDEPENDENT_AMBULATORY_CARE_PROVIDER_SITE_OTHER): Payer: Medicare Other | Admitting: Family Medicine

## 2016-02-02 ENCOUNTER — Encounter: Payer: Self-pay | Admitting: Family Medicine

## 2016-02-02 VITALS — BP 158/100 | Ht 70.0 in | Wt 277.0 lb

## 2016-02-02 DIAGNOSIS — M545 Low back pain: Secondary | ICD-10-CM | POA: Diagnosis not present

## 2016-02-02 DIAGNOSIS — E119 Type 2 diabetes mellitus without complications: Secondary | ICD-10-CM | POA: Diagnosis not present

## 2016-02-02 DIAGNOSIS — F329 Major depressive disorder, single episode, unspecified: Secondary | ICD-10-CM

## 2016-02-02 DIAGNOSIS — F32A Depression, unspecified: Secondary | ICD-10-CM

## 2016-02-02 LAB — POCT GLYCOSYLATED HEMOGLOBIN (HGB A1C): Hemoglobin A1C: 7.2

## 2016-02-02 MED ORDER — GLIPIZIDE 5 MG PO TABS: ORAL_TABLET | ORAL | Status: DC

## 2016-02-02 MED ORDER — OXYCODONE-ACETAMINOPHEN 5-325 MG PO TABS: 1.0000 | ORAL_TABLET | ORAL | Status: DC | PRN

## 2016-02-02 MED ORDER — LEVOFLOXACIN 500 MG PO TABS: 500.0000 mg | ORAL_TABLET | Freq: Every day | ORAL | Status: DC

## 2016-02-02 NOTE — Progress Notes (Signed)
   Subjective:    Patient ID: Joseph ForthJerry R [name], male    DOB: 30-Jul-1971, 45 y.o.   MRN: [id number]  Diabetes He presents for his follow-up diabetic visit. He has type 2 diabetes mellitus. He never participates in exercise. Home blood sugar record trend: pt does not check blood sugar. Eye exam is current.  A1C today 7.2.  Cough and congestion for one week. , Productive yellowish phlegm. Some increase with wheezing, no fever  This patient was seen today for chronic pain. Takes for shoulder pain and knee pain.  The medication list was reviewed and updated.   -Compliance with medication: none  - Number patient states they take daily: 4 a day  -when was the last dose patient took? This am  The patient was advised the importance of maintaining medication and not using illegal substances with these.  Refills needed: yes  The patient was educated that we can provide 3 monthly scripts for their medication, it is their responsibility to follow the instructions.  Side effects or complications from medications: none  Patient is aware that pain medications are meant to minimize the severity of the pain to allow their pain levels to improve to allow for better function. They are aware of that pain medications cannot totally remove their pain.  Due for UDT ( at least once per year) : last one done June 2016   Cough productive of phlegm and ocng for the past sevendays  Results for orders placed or performed in visit on 02/02/16  POCT glycosylated hemoglobin (Hb A1C)  Result Value Ref Range   Hemoglobin A1C 7.2    States stress ongoing. Feels tight phlegm benefits from antidepressant medicine. No obvious side effects. Meds reviewed today.  Review of Systems No headache no chest pain no back pain no abdominal pain no change in bowel habits ROS otherwise negative    Objective:   Physical Exam  Alert vitals stable blood pressure good on repeat HEENT moderate nasal congestion pharynx normal  lungs bilateral clear heart rare rhythm ankles without edema was low back pain to percussion      Assessment & Plan:  Impression 1 type 2 diabetes suboptimum discuss patient minutes to dietary noncompliance #2 rhinosinusitis/bronchitis #3 chronic pain. Long-standing use of medicines for low back pain. States deftly needs plan medications refilled. Antibiotic prescribed. Other meds refilled maintain diet exercise discussed recheck in several months [facility]

## 2016-02-02 NOTE — Telephone Encounter (Signed)
Spoke with pt. States that he needs a new CPAP machine. His current machine is not working. Advised him that we have not seen him since 2015 for OSA. He will need an appointment, OV has been scheduled for 02/05/16 at 4pm. Advised pt that we will order his new machine once he comes in. He agreed and verbalized understanding.

## 2016-02-05 ENCOUNTER — Encounter: Payer: Self-pay | Admitting: Adult Health

## 2016-02-05 ENCOUNTER — Ambulatory Visit (INDEPENDENT_AMBULATORY_CARE_PROVIDER_SITE_OTHER): Payer: Medicare Other | Admitting: Adult Health

## 2016-02-05 VITALS — BP 110/74 | HR 101 | Temp 98.7°F | Ht 70.0 in | Wt 275.0 lb

## 2016-02-05 DIAGNOSIS — J4520 Mild intermittent asthma, uncomplicated: Secondary | ICD-10-CM

## 2016-02-05 DIAGNOSIS — G4733 Obstructive sleep apnea (adult) (pediatric): Secondary | ICD-10-CM | POA: Diagnosis not present

## 2016-02-05 NOTE — Assessment & Plan Note (Signed)
Compensated on present regimen.   

## 2016-02-05 NOTE — Progress Notes (Signed)
Subjective:   Patient ID: Joseph [name], male    DOB: 04/09/1971   MRN: [id number]    Brief patient profile:  44yowm active smoker without significant airflow obst by PFTs 06/02/12 and 03/08/2015    History of Present Illness  IOV 04/10/12   In April 2013 cc  had pneumonia apparently. Went to [facility] ER and found some abnormality (nodule based on review of ER notes) on CXR that resulted in CT Angio 03/07/12 same visit. PE ruled out but found to have mediastinal nodes but the cxr nodule was not there. Apparently ER doctor said it was lymphoma and asked to see an oncologist. But primary care doctor felt that was over zealous and referred patient here. He prefers conservative mgmt to nodes if clinical suspicion for cancer is low  cc lack of energy (spent most of days yesterday in bed), esp in pollen, chronic dyspnea on exertion for 180 feet relieved by rest  But slowly progressive since 2002 for which he uses albuterol prn on average 4 times a day.  Walkt test 185 feet x 3 laps: no desaturation  He smokes at baseline; trying quit, prior intolerance (dreams) with chantix.   Only baseline mild chronic cough with associtaed wheeze that is occasional.. Does have some B symptoms like nocturnal diaphoresis (soaks pillows). Denies weight loss.   CT ANGIOGRAPHY CHEST  Comparison: 03/07/2012  An abnormal right lower paratracheal node has a short axis diameter  of 1.6 cm. An abnormal AP window lymph node has a short axis  diameter of 1.4 cm. Mild bilateral hilar and infrahilar adenopathy  noted. Scattered small axillary lymph nodes are present.  A subcarinal node has a short axis diameter of 1.5 cm.     OV 08/14/2012 Mediastinal nodes: PET scan 06/10/12 shows very low uptake in the mediastinal nodes. Suspicion for cancer is very low. Lymph nodes include 13 mm right lower paratracheal lymph node and 10 mm prevascular lymph node. ACE level 04/10/12  - is in 20s and normal  Still dyspneic: 1 flught of  steps and better with rest and with albuterol and atrovent prn. Dyspne also worse in winter and cooler weathers. Rates it as moderate. STable since last visit to slightly worse. thre is some associtaed cough. No asociated weight loss. COugh is moderate and worst early in morning and in cooler temperatues.. Denies sinus drainage. PFts show mixed picture - fvc 3.4L/69%, fev1 2.9/76%, Ratio 85 but 12% bd respose. TLC 5.7/83%. DLCO 23.7/69% rec rec #MEdiastinal node  - suspicion for cancer is low  - you will need CT chest in a year; we will schedule it later  #Smoking  - glad you are working on quitting  #Shortness of breath - this is due to asthma/copd and weight  - please start QVAR 2 puff twice daily - take sample and show technique and take prescription too  - focus on weight loss through the low glycemic diet; take diet sheet from [place]  #FOllowup  - 3 months with spirometry at followup  - flu shot and pneumovax through PMD as discussed   12/17/2013  acute ov/Wert still active smoker re: recurrent pattern of coughing x a decade intermittent / out of control since nov 2014/ can't take symbicort makes it worse / on percocet one tid at baseline  Chief Complaint  Patient presents with  . Acute Visit    MR pt.  C/o mostly nonprod cough with some thick white mucous, coughing until he almost passes out.  [facility]ent to [facility] last week.    duoneb daily  Sometime skips the doses later in the day but always uses the am dose, sputum to purulent. Mostly sob with cough/ otherwise breaths ok Last neb was 4 h prior to OV   rec For Cough - use the flutter valve as much as you can plus mucinex dm 1200 mg every 12 hours as needed supplement with percocet up to 2 every 4hours For Breathing  Only use your duoneb  rescue medication . - Ok to use up to    every 4 hours if you must  Prednisone 10 mg take  4 each am x 2 days,   2 each am x 2 days,  1 each am x 2 days and stop  Pantoprazole (protonix) 40 mg    Take 30-60 min before first meal of the day and Pepcid 20 mg one bedtime until return to office - this is the best way to tell whether stomach acid is contributing to your problem.  GERD  Diet . Please schedule a follow up office visit in 2 weeks, sooner if needed with all meds in hand  07/27/2014 f/u ov/Wert re: asthma / still smoking  Chief Complaint  Patient presents with  . Follow-up    Pt c/o increased SOB and cough for the past month- cough is occ prod with minimal clear sputum. Pt currently taking clindamycin for staph infection. Rarely using albuterol inhaler, but is using neb approx 4 times per day.   off dulera x 1.5 week denies more sob/ cough or need for rescue on vs off rec Plan A=  dulera 200 Take 2 puffs first thing in am and then another 2 puffs about 12 hours later (or formulary alternative- call us if you find a cheaper one)  Plan B =  Backup = proaire  Plan C = Backup plan B = nebulized albuterol up to 4 hours but call if needing this much at all The key is to stop smoking completely before smoking completely stops you!    11/29/2014 f/u ov/Wert re: s/p "aecopd" still smoking  Chief Complaint  Patient presents with  . Follow-up    Pt states that his SOB and cough have been worse for the past several wks. He went to ED for COPD exacerbation on 11/14/14.  He is using proair 3-4 times per day and neb about 3 times per day on average.   one month prior to flare needing proair 1-2 per day and neb at least once or twice also - using neb first thing instead of dulera  Better p pred from er then worse again w/in a week of taper off assoc with congested cough but mostly white mucus esp in ams rec Think of your respiratory medications as multiple steps you can take to control your symptoms and avoid having to go to the ER Plan A is your maintenance daily no matter what meds: dulera Take 2 puffs first thing in am and then another 2 puffs about 12 hours later.  Plan B only use after  you've used your maintenance (Plan A) medication, and only if you can't catch your breath: Proair 2 every 4 hours as needed  Plan C only use after you've used plan A and B and still can't catch your breath: nebulizer albuterol 2.5 mg up to every 4 hours  Plan D(for Doctor):  If you've used A thru C and not doing a lot better or still needing C more  than a once a day,  D = call the doctor for evaluation asap Plan E (for ER):  If still not able to catch your breath, even after using your nebulizer up to every 4 hours, go to ER  For cough > delsym 2 tsp every 12 hours is the best you can buy Prevacid 30 mg Take 30-60 min before first meal of the day  GERD diet      03/08/2015 f/u ov/Wert re: still smoking / off dulera x 48 h and off saba hfa/ using neb 8 am of test /no change in symptoms or need for saba on or off dulera  Chief Complaint  Patient presents with  . Follow-up    PFT done today. Pt states that his breathing is about the same since last visit. He is using albuterol inhaler approx 3 x per day.   breathing does better if stays in house,  worse out in cold Even on dulera still using neb at least twice daily  Some sporadic dry fits of  cough with bending over even on dulera but not using ppi correctly  rec Prevacid 30 mg should be to Take 30- 60 min before your first and last meals of the day  I see no evidence that the dulera is helping you and would stop it at this point pending an asthma challenge test  Please see patient coordinator before you leave today  to schedule Methacholine test - do not take any albuterol with in 6 hours of the test> never done     10/03/2015  f/u ov/Wert re: still smoking /  ? asthma vs all vcd/ gerd  Chief Complaint  Patient presents with  . Follow-up    Pt states his breathing is overall doing well. He is using rescue inhaler and neb several times per day.   Wife left him in July 2016 and losing wt, dm better, breathing not since stopped dulera and  never went for mct and can't afford it  No noct symptoms after hs saba/ cpap  saba use up when no access to dulera 200 but even on it he continues to perceive need for saba daily despite nl exams/ pfts rec Please see patient coordinator before you leave today  to schedule a methacholine challenge test>  POSITIVE Ok to resume dulera but stop it 24 hours before the methacholine and no albuterol w/in 6 hours  Please schedule a follow up office visit in 4 weeks, sooner if needed     10/31/2015  f/u ov/Wert re: mild persistent asthma  Chief Complaint  Patient presents with  . Follow-up    Breathing has been worse [place]  He is using rescue inhaler 4-5 x per day and duoneb 2 x per day.   Noted a change on the dulera 100 but can't afford dulera in any form/ reliant on samples rec Increase the dulera 200 Take 2 puffs first thing in am and then another 2 puffs about 12 hours later.  Only use your albuterol (proair) as a rescue medication   Only use the nebulizer if you try the Proair first and it doesn't work    12/12/2015  f/u ov/Wert re: intrinsic asthma  Doing fine as long as stays out of cold and on dulera 200 despite still smoking   Chief Complaint  Patient presents with  . Follow-up    Breathing has improved some. He is using rescue inhaler "just when I go outside". He uses neb  5 x per wk on average.    >>no changes   02/05/2016 Follow up : Asthma/OSA Pt returns for 2 month follow up  Recent URI , tx w/ abx.   Pt states breathing is doing well and has no new complaints.   Use CPAP every night and would like to discuss a new CPAP machine. CPAP blew up and needs new machine. Says machine is very old.  Would like order sent to [facility].  NPSG in 10/2011 -showed moderate OSA with AHI of 19 per hour and lowest desaturation of 86%. This was corrected by CPAP of 8 cm with a full face mask. Uses his CPAP with naps and At bedtime  .  Anxious to get new machine.  Denies chest  pain, orthopnea, edema or fever.      Current Medications, Allergies, Complete Past Medical History, Past Surgical History, Family History, and Social History were reviewed in [employer] record.  ROS  The following are not active complaints unless bolded sore throat, dysphagia, dental problems, itching, sneezing,  nasal congestion or excess/ purulent secretions, ear ache,   fever, chills, sweats, unintended wt loss, pleuritic or exertional cp, hemoptysis,  orthopnea pnd or leg swelling, presyncope, palpitations, heartburn, abdominal pain, anorexia, nausea, vomiting, diarrhea  or change in bowel or urinary habits, change in stools or urine, dysuria,hematuria,  rash, arthralgias, visual complaints, headache, numbness weakness or ataxia or problems with walking or coordination,  change in mood/affect or memory.        Objective:   Physical Exam  amb obese wm nad  / vital signs reviewed Filed Vitals:   02/05/16 1545  BP: 110/74  Pulse: 101  Temp: 98.7 F (37.1 C)  TempSrc: Oral  Height:  (1.778 m)  Weight: 275 lb (124.739 kg)  SpO2: 97%     12/31/2013  294  >    07/27/2014  286 > 11/29/2014   289> 03/08/2015  284 > 10/03/2015 266 > 10/31/2015 273 > 12/12/2015    280 >275 02/05/2016      HEENT: nl dentition, turbinates, and oropharynx. Nl external ear canals without cough reflex Class 2-3 MP airway    NECK :  without JVD/Nodes/TM/ nl carotid upstrokes bilaterally   LUNGS: no acc muscle use, clear to a and P bilaterally    CV:  RRR  no s3 or murmur or increase in P2, no edema   ABD:  soft and nontender with nl excursion in the supine position. No bruits or organomegaly, bowel sounds nl  MS:  warm without deformities, calf tenderness, cyanosis or clubbing  SKIN: warm and dry without lesions    NEURO:  alert, approp, no deficits     Djuan Talton NP-C   Pulmonary and Critical Care  02/05/2016         Assessment & Plan:

## 2016-02-05 NOTE — Patient Instructions (Signed)
Continue on CPAP At bedtime  .  Order sent for new machine and supplies  follow up Dr. Vassie LollAlva  In 4 months and As needed   Follow up with Dr. Sherene SiresWert  In 3 months and As needed   Work on not smoking .

## 2016-02-05 NOTE — Assessment & Plan Note (Signed)
Order for new CPAP machine sent to DME.  Plan  Continue on CPAP At bedtime  .  Order sent for new machine and supplies  follow up Dr. Vassie LollAlva  In 4 months and As needed   Follow up with Dr. Sherene SiresWert  In 3 months and As needed   Work on not smoking .

## 2016-02-05 NOTE — Assessment & Plan Note (Signed)
Wt loss  

## 2016-02-05 NOTE — Addendum Note (Signed)
Addended by: Karalee HeightOX, Kentley Cedillo P on: 02/05/2016 04:23 PM   Modules accepted: Orders

## 2016-02-06 NOTE — Progress Notes (Signed)
Reviewed & agree with plan  

## 2016-02-12 ENCOUNTER — Telehealth: Payer: Self-pay | Admitting: *Deleted

## 2016-02-12 NOTE — Telephone Encounter (Signed)
Doing prior auth for pt's xanax. Needed to know start date of med. Pt was getting xanax 0.5 one bid up until 11/20/22. Pharm called on that day and ask if it could be swithced to 1mg  one - half bid because they were out of 0.5mg . Pt then getting refills of 1mg  #30. Then on 10/04/2014 pt started getting xanax 1mg  #60 one bid. Pt was here that day but office note does not mention changing xanax. Pt has been getting #60 since then.

## 2016-02-12 NOTE — Telephone Encounter (Signed)
Dr. Steve to see 

## 2016-02-13 ENCOUNTER — Ambulatory Visit: Payer: Medicare Other | Admitting: Internal Medicine

## 2016-02-13 NOTE — Telephone Encounter (Signed)
Ok we will maintain that pt reporting tremendous stress

## 2016-02-13 NOTE — Telephone Encounter (Signed)
Prior Authorization faxed

## 2016-02-13 NOTE — Telephone Encounter (Signed)
Call pt and ask how he ist taking it

## 2016-02-13 NOTE — Telephone Encounter (Signed)
Patient taking xanax 1mg   #60 one tablet up to twice a day as needed

## 2016-02-14 ENCOUNTER — Telehealth: Payer: Self-pay | Admitting: Family Medicine

## 2016-02-14 ENCOUNTER — Encounter: Payer: Self-pay | Admitting: Family Medicine

## 2016-02-14 NOTE — Telephone Encounter (Signed)
Rx prior auth APPROVED for pt's ALPRAZolam [name](XANAX) 1 MG tablet , valid 02/01/16 - 01/31/17 [facility]

## 2016-02-14 NOTE — Telephone Encounter (Signed)
Rx prior auth DENIED for pt's ANDROGEL PUMP 20.25 MG/ACT (1.62%) GEL , criteria not met due to not having recent labs showing testosterone levels, please see denial in green folder, please advise

## 2016-02-14 NOTE — Telephone Encounter (Signed)
[facility] states we have been giving the scripts for last several years

## 2016-02-14 NOTE — Telephone Encounter (Signed)
Rx prior auth APPROVED for pt's lansoprazole (PREVACID) 30 MG capsule, valid 02/13/16 - 12/01/16 [facility]

## 2016-02-14 NOTE — Telephone Encounter (Signed)
Generally this pt comes to me with six problem s or so per visit i recall no recent fdisc of androgel--get pharm record and see if urologist has been rxing this to him in pas one year at all

## 2016-02-20 ENCOUNTER — Telehealth: Payer: Self-pay | Admitting: Pulmonary Disease

## 2016-02-20 ENCOUNTER — Ambulatory Visit (INDEPENDENT_AMBULATORY_CARE_PROVIDER_SITE_OTHER): Payer: Medicare Other | Admitting: Family Medicine

## 2016-02-20 ENCOUNTER — Encounter: Payer: Self-pay | Admitting: Family Medicine

## 2016-02-20 VITALS — BP 130/82 | Temp 98.6°F | Ht 70.0 in | Wt 274.0 lb

## 2016-02-20 DIAGNOSIS — Z79899 Other long term (current) drug therapy: Secondary | ICD-10-CM | POA: Diagnosis not present

## 2016-02-20 DIAGNOSIS — E291 Testicular hypofunction: Secondary | ICD-10-CM

## 2016-02-20 DIAGNOSIS — R5383 Other fatigue: Secondary | ICD-10-CM | POA: Diagnosis not present

## 2016-02-20 DIAGNOSIS — R7989 Other specified abnormal findings of blood chemistry: Secondary | ICD-10-CM

## 2016-02-20 DIAGNOSIS — E119 Type 2 diabetes mellitus without complications: Secondary | ICD-10-CM

## 2016-02-20 DIAGNOSIS — J111 Influenza due to unidentified influenza virus with other respiratory manifestations: Secondary | ICD-10-CM

## 2016-02-20 DIAGNOSIS — Z13220 Encounter for screening for lipoid disorders: Secondary | ICD-10-CM

## 2016-02-20 MED ORDER — OSELTAMIVIR PHOSPHATE 75 MG PO CAPS: 75.0000 mg | ORAL_CAPSULE | Freq: Two times a day (BID) | ORAL | Status: DC

## 2016-02-20 MED ORDER — PREDNISONE 20 MG PO TABS: ORAL_TABLET | ORAL | Status: DC

## 2016-02-20 NOTE — Progress Notes (Signed)
   Subjective:    Patient ID: Joseph [name], male    DOB: [birth year], 45 y.o.   MRN: [id number]  Cough This is a new problem. The current episode started in the past 7 days. The problem has been unchanged. Associated symptoms include a fever, headaches, a sore throat and wheezing. Associated symptoms comments: Abdominal pain, runny nose. Nothing aggravates the symptoms. He has tried nothing Joseph [name](Coldez) for the symptoms. The treatment provided no relief.   Pos cough   Bad headache  Bad muscle aching and severe headache with the cough  Energy level zero  Appetite zero     Review of Systems  Constitutional: Positive for fever.  HENT: Positive for sore throat.   Respiratory: Positive for cough and wheezing.   Neurological: Positive for headaches.       Objective:   Physical Exam  alert moderate malaise. HEENT moderate nasal congestion pharynx normal lungs moderate wheezes bronchial cough heart rare rhythm       Assessment & Plan:   impression flu with exacerbation of asthma plan prednisone taper frequent neb treatments Tamiflu prescribed symptom care discussed also appropriate blood work for chronic conditions W[facility]

## 2016-02-20 NOTE — Telephone Encounter (Signed)
Noted. Nothing further needed. 

## 2016-02-22 ENCOUNTER — Other Ambulatory Visit: Payer: Self-pay | Admitting: *Deleted

## 2016-02-22 MED ORDER — GLIPIZIDE 5 MG PO TABS: ORAL_TABLET | ORAL | Status: DC

## 2016-02-23 ENCOUNTER — Telehealth: Payer: Self-pay | Admitting: Family Medicine

## 2016-02-23 ENCOUNTER — Other Ambulatory Visit: Payer: Self-pay | Admitting: *Deleted

## 2016-02-23 MED ORDER — BENZONATATE 100 MG PO CAPS: 100.0000 mg | ORAL_CAPSULE | Freq: Three times a day (TID) | ORAL | Status: DC | PRN

## 2016-02-23 NOTE — Telephone Encounter (Signed)
Med sent to pharm. Pt notified.  

## 2016-02-23 NOTE — Telephone Encounter (Signed)
Pt was seen on Tuesday and was diagnosed with flu. Pt has a bad cough and wants to know if tessalon pearls can be called in.      [facility]

## 2016-02-23 NOTE — Telephone Encounter (Signed)
Tessalon 100 mg, #21, 1 taken 3 times daily as needed for cough, 1 refill-if ongoing troubles or if worse recommend follow-up office visit

## 2016-02-28 ENCOUNTER — Telehealth: Payer: Self-pay | Admitting: Family Medicine

## 2016-02-28 MED ORDER — AMOXICILLIN-POT CLAVULANATE 875-125 MG PO TABS: ORAL_TABLET | ORAL | Status: DC

## 2016-02-28 NOTE — Telephone Encounter (Signed)
Discussed patient symptoms with Dr.Scott Luking in real time received order for Augmentin 875 BID with snack for 10 days. Patient verbalized understanding.

## 2016-02-28 NOTE — Telephone Encounter (Signed)
Pt called stating that he believes he has bronchitis now and was told to call if he developed any other symptoms. Pt would like another antibiotic to be called in. Pt is also requesting 4 or 5 days worth of 7.5 mg percocet due to the coughing straining muscles in his stomach.

## 2016-02-29 ENCOUNTER — Telehealth: Payer: Self-pay | Admitting: Family Medicine

## 2016-02-29 DIAGNOSIS — H35363 Drusen (degenerative) of macula, bilateral: Secondary | ICD-10-CM | POA: Diagnosis not present

## 2016-02-29 DIAGNOSIS — E119 Type 2 diabetes mellitus without complications: Secondary | ICD-10-CM | POA: Diagnosis not present

## 2016-02-29 DIAGNOSIS — H5213 Myopia, bilateral: Secondary | ICD-10-CM | POA: Diagnosis not present

## 2016-02-29 DIAGNOSIS — H25041 Posterior subcapsular polar age-related cataract, right eye: Secondary | ICD-10-CM | POA: Diagnosis not present

## 2016-02-29 NOTE — Telephone Encounter (Signed)
Pt calling today to see if you are going to fill some percocet for him  7.5 mg  Just for a 4-5 days worth. States his cough has caused him  To pull a muscle in his chest/abd area.   Please advise original note taken 3/29

## 2016-02-29 NOTE — Telephone Encounter (Signed)
Notified patient Dr. Brett CanalesSteve thinks it is a huge mistake to add on more narcotics for chronic cough in someone on multiple narcotics per d anyway AND self induced COPD with high likelihood of escalating cough issues, So no. Patient verbalized understanding.

## 2016-02-29 NOTE — Telephone Encounter (Signed)
I think it is a huge mistake to add on more narcotics for chronic cough in someone on multiple narcotics per d anyway AND self induced COPD with high likelihood of escalating cough issues,  So no

## 2016-03-01 ENCOUNTER — Encounter: Payer: Self-pay | Admitting: Family Medicine

## 2016-03-04 NOTE — Patient Instructions (Signed)
Your procedure is scheduled on: 03/11/2016  [facility] to [facility] at  1100  AM.  Call this number if you have problems the morning of surgery: [phone]   Do not eat food or drink liquids :After Midnight.      Take these medicines the morning of surgery with A SIP OF WATER: xanax, wellbutrin, celexa, prevacid, oxycodone, deltazone, revatio. Take your nebulizer before you come. Bring your inhaler with you.   Do not wear jewelry, make-up or nail polish.  Do not wear lotions, powders, or perfumes. You may wear deodorant.  Do not shave 48 hours prior to surgery.  Do not bring valuables to the hospital.  Contacts, dentures or bridgework may not be worn into surgery.  Leave suitcase in the car. After surgery it may be brought to your room.  For patients admitted to the hospital, checkout time is 11:00 AM the day of discharge.   Patients discharged the day of surgery will not be allowed to drive home.  :     Please read over the following fact sheets that you were given: Coughing and Deep Breathing, Surgical Site Infection Prevention, Anesthesia Post-op Instructions and Care and Recovery After Surgery    Cataract A cataract is a clouding of the lens of the eye. When a lens becomes cloudy, vision is reduced based on the degree and nature of the clouding. Many cataracts reduce vision to some degree. Some cataracts make people more near-sighted as they develop. Other cataracts increase glare. Cataracts that are ignored and become worse can sometimes look white. The white color can be seen through the pupil. CAUSES   Aging. However, cataracts may occur at any age, even in newborns.   Certain drugs.   Trauma to the eye.   Certain diseases such as diabetes.   Specific eye diseases such as chronic inflammation inside the eye or a sudden attack of a rare form of glaucoma.   Inherited or acquired medical problems.  SYMPTOMS   Gradual, progressive drop in vision in the affected eye.   Severe,  rapid visual loss. This most often happens when trauma is the cause.  DIAGNOSIS  To detect a cataract, an eye doctor examines the lens. Cataracts are best diagnosed with an exam of the eyes with the pupils enlarged (dilated) by drops.  TREATMENT  For an early cataract, vision may improve by using different eyeglasses or stronger lighting. If that does not help your vision, surgery is the only effective treatment. A cataract needs to be surgically removed when vision loss interferes with your everyday activities, such as driving, reading, or watching TV. A cataract may also have to be removed if it prevents examination or treatment of another eye problem. Surgery removes the cloudy lens and usually replaces it with a substitute lens (intraocular lens, IOL).  At a time when both you and your doctor agree, the cataract will be surgically removed. If you have cataracts in both eyes, only one is usually removed at a time. This allows the operated eye to heal and be out of danger from any possible problems after surgery (such as infection or poor wound healing). In rare cases, a cataract may be doing damage to your eye. In these cases, your caregiver may advise surgical removal right away. The vast majority of people who have cataract surgery have better vision afterward. HOME CARE INSTRUCTIONS  If you are not planning surgery, you may be asked to do the following:  Use different eyeglasses.  Use stronger or brighter lighting.   Ask your eye doctor about reducing your medicine dose or changing medicines if it is thought that a medicine caused your cataract. Changing medicines does not make the cataract go away on its own.   Become familiar with your surroundings. Poor vision can lead to injury. Avoid bumping into things on the affected side. You are at a higher risk for tripping or falling.   Exercise extreme care when driving or operating machinery.   Wear sunglasses if you are sensitive to bright  light or experiencing problems with glare.  SEEK IMMEDIATE MEDICAL CARE IF:   You have a worsening or sudden vision loss.   You notice redness, swelling, or increasing pain in the eye.   You have a fever.  Document Released: 11/18/2005 Document Revised: 11/07/2011 Document Reviewed: 07/12/2011 [facility] Patient Information 2012 [place].PATIENT INSTRUCTIONS POST-ANESTHESIA  IMMEDIATELY FOLLOWING SURGERY:  Do not drive or operate machinery for the first twenty four hours after surgery.  Do not make any important decisions for twenty four hours after surgery or while taking narcotic pain medications or sedatives.  If you develop intractable nausea and vomiting or a severe headache please notify your doctor immediately.  FOLLOW-UP:  Please make an appointment with your surgeon as instructed. You do not need to follow up with anesthesia unless specifically instructed to do so.  WOUND CARE INSTRUCTIONS (if applicable):  Keep a dry clean dressing on the anesthesia/puncture wound site if there is drainage.  Once the wound has quit draining you may leave it open to air.  Generally you should leave the bandage intact for twenty four hours unless there is drainage.  If the epidural site drains for more than 36-48 hours please call the anesthesia department.  QUESTIONS?:  Please feel free to call your physician or the hospital operator if you have any questions, and they will be happy to assist you.

## 2016-03-05 ENCOUNTER — Other Ambulatory Visit: Payer: Self-pay

## 2016-03-05 ENCOUNTER — Encounter (HOSPITAL_COMMUNITY): Payer: Self-pay

## 2016-03-05 ENCOUNTER — Telehealth: Payer: Self-pay | Admitting: Family Medicine

## 2016-03-05 ENCOUNTER — Encounter (HOSPITAL_COMMUNITY)
Admission: RE | Admit: 2016-03-05 | Discharge: 2016-03-05 | Disposition: A | Discharge status: Home / Self Care | Payer: Medicare Other | Source: Ambulatory Visit | Attending: Ophthalmology | Admitting: Ophthalmology

## 2016-03-05 DIAGNOSIS — Z8709 Personal history of other diseases of the respiratory system: Secondary | ICD-10-CM | POA: Diagnosis not present

## 2016-03-05 DIAGNOSIS — R0602 Shortness of breath: Secondary | ICD-10-CM | POA: Diagnosis not present

## 2016-03-05 DIAGNOSIS — Z01810 Encounter for preprocedural cardiovascular examination: Secondary | ICD-10-CM | POA: Insufficient documentation

## 2016-03-05 NOTE — Telephone Encounter (Signed)
NO

## 2016-03-05 NOTE — Telephone Encounter (Signed)
Patients percocet is due to be filled tomorrow.  He has been having complications from the flu so he wants to know if we can approve this to be filled today instead of tomorrow.   [facility]

## 2016-03-05 NOTE — Telephone Encounter (Signed)
Spoke with patient and informed him per Dr.Steve Luking- We will not be approving percocet to be filled a day early at this time. Patient verbalized understanding.

## 2016-03-09 ENCOUNTER — Other Ambulatory Visit: Payer: Self-pay | Admitting: Family Medicine

## 2016-03-11 ENCOUNTER — Ambulatory Visit (HOSPITAL_COMMUNITY): Payer: Medicare Other | Admitting: Anesthesiology

## 2016-03-11 ENCOUNTER — Ambulatory Visit (HOSPITAL_COMMUNITY)
Admission: RE | Admit: 2016-03-11 | Discharge: 2016-03-11 | Disposition: A | Discharge status: Home / Self Care | Payer: Medicare Other | Source: Ambulatory Visit | Attending: Ophthalmology | Admitting: Ophthalmology

## 2016-03-11 ENCOUNTER — Encounter (HOSPITAL_COMMUNITY)
Admission: RE | Disposition: A | Discharge status: Home / Self Care | Payer: Self-pay | Source: Ambulatory Visit | Attending: Ophthalmology

## 2016-03-11 ENCOUNTER — Encounter (HOSPITAL_COMMUNITY): Payer: Self-pay | Admitting: Ophthalmology

## 2016-03-11 DIAGNOSIS — E119 Type 2 diabetes mellitus without complications: Secondary | ICD-10-CM | POA: Insufficient documentation

## 2016-03-11 DIAGNOSIS — Z79899 Other long term (current) drug therapy: Secondary | ICD-10-CM | POA: Insufficient documentation

## 2016-03-11 DIAGNOSIS — K219 Gastro-esophageal reflux disease without esophagitis: Secondary | ICD-10-CM | POA: Insufficient documentation

## 2016-03-11 DIAGNOSIS — H25041 Posterior subcapsular polar age-related cataract, right eye: Secondary | ICD-10-CM | POA: Diagnosis not present

## 2016-03-11 DIAGNOSIS — H269 Unspecified cataract: Secondary | ICD-10-CM | POA: Diagnosis not present

## 2016-03-11 DIAGNOSIS — J449 Chronic obstructive pulmonary disease, unspecified: Secondary | ICD-10-CM | POA: Insufficient documentation

## 2016-03-11 DIAGNOSIS — J45909 Unspecified asthma, uncomplicated: Secondary | ICD-10-CM | POA: Insufficient documentation

## 2016-03-11 DIAGNOSIS — Z7984 Long term (current) use of oral hypoglycemic drugs: Secondary | ICD-10-CM | POA: Insufficient documentation

## 2016-03-11 HISTORY — PX: CATARACT EXTRACTION W/PHACO: SHX586

## 2016-03-11 LAB — GLUCOSE, CAPILLARY: Glucose-Capillary: 244 mg/dL — ABNORMAL HIGH (ref 65–99)

## 2016-03-11 SURGERY — PHACOEMULSIFICATION, CATARACT, WITH IOL INSERTION
Anesthesia: Monitor Anesthesia Care | Site: Eye | Laterality: Right

## 2016-03-11 MED ORDER — PROVISC 10 MG/ML IO SOLN
INTRAOCULAR | Status: DC | PRN
  Administered 2016-03-11: 0.85 mL via INTRAOCULAR

## 2016-03-11 MED ORDER — BSS IO SOLN
INTRAOCULAR | Status: DC | PRN
  Administered 2016-03-11: 15 mL

## 2016-03-11 MED ORDER — LIDOCAINE HCL 3.5 % OP GEL: 1.0000 "application " | Freq: Once | OPHTHALMIC | Status: DC

## 2016-03-11 MED ORDER — EPINEPHRINE HCL 1 MG/ML IJ SOLN
INTRAMUSCULAR | Status: AC
Start: 2016-03-11 — End: 2016-03-11
  Filled 2016-03-11: qty 1

## 2016-03-11 MED ORDER — FENTANYL CITRATE (PF) 100 MCG/2ML IJ SOLN
25.0000 ug | INTRAMUSCULAR | Status: AC
  Administered 2016-03-11: 25 ug via INTRAVENOUS

## 2016-03-11 MED ORDER — FENTANYL CITRATE (PF) 100 MCG/2ML IJ SOLN
INTRAMUSCULAR | Status: AC
  Filled 2016-03-11: qty 2

## 2016-03-11 MED ORDER — LIDOCAINE 3.5 % OP GEL OPTIME - NO CHARGE
OPHTHALMIC | Status: DC | PRN
  Administered 2016-03-11: 1 [drp] via OPHTHALMIC

## 2016-03-11 MED ORDER — MIDAZOLAM HCL 2 MG/2ML IJ SOLN
1.0000 mg | INTRAMUSCULAR | Status: DC | PRN
  Administered 2016-03-11 (×2): 1 mg via INTRAVENOUS

## 2016-03-11 MED ORDER — LIDOCAINE HCL (PF) 1 % IJ SOLN
INTRAMUSCULAR | Status: DC | PRN
  Administered 2016-03-11: .7 mL

## 2016-03-11 MED ORDER — LACTATED RINGERS IV SOLN
INTRAVENOUS | Status: DC
  Administered 2016-03-11: 75 mL/h via INTRAVENOUS

## 2016-03-11 MED ORDER — EPINEPHRINE HCL 1 MG/ML IJ SOLN
INTRAOCULAR | Status: DC | PRN
  Administered 2016-03-11: 500 mL

## 2016-03-11 MED ORDER — PHENYLEPHRINE HCL 2.5 % OP SOLN
1.0000 [drp] | OPHTHALMIC | Status: AC
  Administered 2016-03-11 (×3): 1 [drp] via OPHTHALMIC

## 2016-03-11 MED ORDER — MIDAZOLAM HCL 2 MG/2ML IJ SOLN
INTRAMUSCULAR | Status: AC
  Filled 2016-03-11: qty 2

## 2016-03-11 MED ORDER — POVIDONE-IODINE 5 % OP SOLN
OPHTHALMIC | Status: DC | PRN
  Administered 2016-03-11: 1 via OPHTHALMIC

## 2016-03-11 MED ORDER — CYCLOPENTOLATE-PHENYLEPHRINE 0.2-1 % OP SOLN
1.0000 [drp] | OPHTHALMIC | Status: AC
  Administered 2016-03-11 (×3): 1 [drp] via OPHTHALMIC

## 2016-03-11 MED ORDER — NEOMYCIN-POLYMYXIN-DEXAMETH 3.5-10000-0.1 OP SUSP
OPHTHALMIC | Status: DC | PRN
  Administered 2016-03-11: 2 [drp] via OPHTHALMIC

## 2016-03-11 MED ORDER — TETRACAINE HCL 0.5 % OP SOLN
1.0000 [drp] | OPHTHALMIC | Status: AC
  Administered 2016-03-11 (×3): 1 [drp] via OPHTHALMIC

## 2016-03-11 SURGICAL SUPPLY — 23 items
CAPSULAR TENSION RING-AMO (OPHTHALMIC RELATED) IMPLANT
CLOTH BEACON ORANGE TIMEOUT ST (SAFETY) ×1 IMPLANT
EYE SHIELD UNIVERSAL CLEAR (GAUZE/BANDAGES/DRESSINGS) ×1 IMPLANT
GLOVE BIOGEL PI IND STRL 7.0 (GLOVE) IMPLANT
GLOVE BIOGEL PI IND STRL 7.5 (GLOVE) IMPLANT
GLOVE BIOGEL PI INDICATOR 7.0 (GLOVE) ×1
GLOVE BIOGEL PI INDICATOR 7.5 (GLOVE)
GLOVE EXAM NITRILE LRG STRL (GLOVE) IMPLANT
GLOVE EXAM NITRILE MD LF STRL (GLOVE) ×1 IMPLANT
KIT VITRECTOMY (OPHTHALMIC RELATED) IMPLANT
PAD ARMBOARD 7.5X6 YLW CONV (MISCELLANEOUS) ×1 IMPLANT
PROC W NO LENS (INTRAOCULAR LENS)
PROC W SPEC LENS (INTRAOCULAR LENS)
PROCESS W NO LENS (INTRAOCULAR LENS) IMPLANT
PROCESS W SPEC LENS (INTRAOCULAR LENS) IMPLANT
RETRACTOR IRIS SIGHTPATH (OPHTHALMIC RELATED) IMPLANT
RING MALYGIN (MISCELLANEOUS) IMPLANT
SIGHTPATH CAT PROC W REG LENS (Ophthalmic Related) ×2 IMPLANT
SYRINGE LUER LOK 1CC (MISCELLANEOUS) ×1 IMPLANT
TAPE SURG TRANSPORE 1 IN (GAUZE/BANDAGES/DRESSINGS) IMPLANT
TAPE SURGICAL TRANSPORE 1 IN (GAUZE/BANDAGES/DRESSINGS) ×1
VISCOELASTIC ADDITIONAL (OPHTHALMIC RELATED) IMPLANT
WATER STERILE IRR 250ML POUR (IV SOLUTION) ×1 IMPLANT

## 2016-03-11 NOTE — H&P (Signed)
I have reviewed the H&P, the patient was re-examined, and I have identified no interval changes in medical condition and plan of care since the history and physical of record  

## 2016-03-11 NOTE — Discharge Instructions (Signed)

## 2016-03-11 NOTE — Anesthesia Preprocedure Evaluation (Signed)
Anesthesia Evaluation  Patient identified by MRN, date of birth, ID band Patient awake    Reviewed: Allergy & Precautions, H&P , NPO status , Patient's Chart, lab work & pertinent test results  History of Anesthesia Complications (+) history of anesthetic complications (severe coughing after emergence due to smoking & COPD)  Airway Mallampati: II  TM Distance: >3 FB   Mouth opening: Limited Mouth Opening  Dental  (+) Teeth Intact, Partial Upper   Pulmonary shortness of breath and with exertion, asthma , sleep apnea and Continuous Positive Airway Pressure Ventilation , COPD,  COPD inhaler, Current Smoker,    breath sounds clear to auscultation- rhonchi       Cardiovascular  Rhythm:Regular Rate:Normal     Neuro/Psych PSYCHIATRIC DISORDERS Anxiety Depression    GI/Hepatic GERD  Medicated and Controlled,  Endo/Other  diabetes, Poorly Controlled, Type 2, Oral Hypoglycemic AgentsMorbid obesity  Renal/GU      Musculoskeletal   Abdominal   Peds  Hematology   Anesthesia Other Findings   Reproductive/Obstetrics                             Anesthesia Physical Anesthesia Plan  ASA: III  Anesthesia Plan: MAC   Post-op Pain Management:    Induction: Intravenous  Airway Management Planned: Nasal Cannula  Additional Equipment:   Intra-op Plan:   Post-operative Plan:   Informed Consent: I have reviewed the patients History and Physical, chart, labs and discussed the procedure including the risks, benefits and alternatives for the proposed anesthesia with the patient or authorized representative who has indicated his/her understanding and acceptance.     Plan Discussed with:   Anesthesia Plan Comments: (GOT possibility discussed and he agrees with plan.)        Anesthesia Quick Evaluation

## 2016-03-11 NOTE — Op Note (Signed)
Date of Admission: 03/11/2016  Date of Surgery: 03/11/2016   Pre-Op Dx: Cataract Right Eye  Post-Op Dx: Senile Posterior Subcapsular Cataract Right  Eye,  Dx Code U98.119H25.041  Surgeon: Gemma PayorKerry Lemar Bakos, M.D.  Assistants: None  Anesthesia: Topical with MAC  Indications: Painless, progressive loss of vision with compromise of daily activities.  Surgery: Cataract Extraction with Intraocular lens Implant Right Eye  Discription: The patient had dilating drops and viscous lidocaine placed into the Right eye in the pre-op holding area. After transfer to the operating room, a time out was performed. The patient was then prepped and draped. Beginning with a 75 degree blade a paracentesis port was made at the surgeon's 2 o'clock position. The anterior chamber was then filled with 1% non-preserved lidocaine. This was followed by filling the anterior chamber with Provisc.  A 2.914mm keratome blade was used to make a clear corneal incision at the temporal limbus.  A bent cystatome needle was used to create a continuous tear capsulotomy. Hydrodissection was performed with balanced salt solution on a Fine canula. The lens nucleus was then removed using the phacoemulsification handpiece. Residual cortex was removed with the I&A handpiece. The anterior chamber and capsular bag were refilled with Provisc. A posterior chamber intraocular lens was placed into the capsular bag with it's injector. The implant was positioned with the Kuglan hook. The Provisc was then removed from the anterior chamber and capsular bag with the I&A handpiece. Stromal hydration of the main incision and paracentesis port was performed with BSS on a Fine canula. The wounds were tested for leak which was negative. The patient tolerated the procedure well. There were no operative complications. The patient was then transferred to the recovery room in stable condition.  Complications: None  Specimen: None  EBL: None  Prosthetic device: Hoya iSert 250,  power 17.0 D, SN [id number].

## 2016-03-11 NOTE — Anesthesia Procedure Notes (Signed)
Procedure Name: MAC Date/Time: 03/11/2016 11:46 AM Performed by: Franco NonesYATES, Maisen Schmit S Pre-anesthesia Checklist: Patient identified, Emergency Drugs available, Suction available, Timeout performed and Patient being monitored Patient Re-evaluated:Patient Re-evaluated prior to inductionOxygen Delivery Method: Nasal Cannula

## 2016-03-11 NOTE — Transfer of Care (Signed)
Immediate Anesthesia Transfer of Care Note  Patient: Joseph ForthJerry R [name]  Procedure(s) Performed: Procedure(s) (LRB): CATARACT EXTRACTION PHACO AND INTRAOCULAR LENS PLACEMENT (IOC) (Right)  Patient Location: Shortstay  Anesthesia Type: MAC  Level of Consciousness: awake  Airway & Oxygen Therapy: Patient Spontanous Breathing   Post-op Assessment: Report given to PACU RN, Post -op Vital signs reviewed and stable and Patient moving all extremities  Post vital signs: Reviewed and stable  Complications: No apparent anesthesia complications

## 2016-03-11 NOTE — Anesthesia Postprocedure Evaluation (Signed)
  Anesthesia Post-op Note  Patient: Joseph [name]  Procedure(s) Performed: Procedure(s) (LRB): CATARACT EXTRACTION PHACO AND INTRAOCULAR LENS PLACEMENT (IOC) (Right)  Patient Location:  Short Stay  Anesthesia Type: MAC  Level of Consciousness: awake  Airway and Oxygen Therapy: Patient Spontanous Breathing  Post-op Pain: none  Post-op Assessment: Post-op Vital signs reviewed, Patient's Cardiovascular Status Stable, Respiratory Function Stable, Patent Airway, No signs of Nausea or vomiting and Pain level controlled  Post-op Vital Signs: Reviewed and stable  Complications: No apparent anesthesia complications

## 2016-03-12 ENCOUNTER — Encounter (HOSPITAL_COMMUNITY): Payer: Self-pay | Admitting: Ophthalmology

## 2016-03-13 MED ORDER — MIDAZOLAM HCL 2 MG/2ML IJ SOLN
INTRAMUSCULAR | Status: DC | PRN
  Administered 2016-03-11: 2 mg via INTRAVENOUS

## 2016-03-13 NOTE — Addendum Note (Signed)
Addendum  created 03/13/16 1040 by Franco Noneseresa S Kenneth Lax, CRNA   Modules edited: Anesthesia Events, Anesthesia Medication Administration, Narrator   Narrator:  Narrator: Event Log Edited

## 2016-03-30 ENCOUNTER — Other Ambulatory Visit: Payer: Self-pay | Admitting: Internal Medicine

## 2016-04-02 ENCOUNTER — Other Ambulatory Visit: Payer: Self-pay | Admitting: Internal Medicine

## 2016-04-11 ENCOUNTER — Other Ambulatory Visit: Payer: Self-pay | Admitting: Family Medicine

## 2016-04-11 NOTE — Telephone Encounter (Signed)
Ok plus 5 ref 

## 2016-04-16 ENCOUNTER — Other Ambulatory Visit: Payer: Self-pay | Admitting: Family Medicine

## 2016-04-16 NOTE — Telephone Encounter (Signed)
Ok six ref 

## 2016-05-01 ENCOUNTER — Encounter: Payer: Self-pay | Admitting: Family Medicine

## 2016-05-01 ENCOUNTER — Ambulatory Visit (INDEPENDENT_AMBULATORY_CARE_PROVIDER_SITE_OTHER): Payer: Medicare Other | Admitting: Family Medicine

## 2016-05-01 VITALS — BP 140/88 | Ht 70.0 in | Wt 261.6 lb

## 2016-05-01 DIAGNOSIS — G894 Chronic pain syndrome: Secondary | ICD-10-CM

## 2016-05-01 DIAGNOSIS — F329 Major depressive disorder, single episode, unspecified: Secondary | ICD-10-CM | POA: Diagnosis not present

## 2016-05-01 DIAGNOSIS — N410 Acute prostatitis: Secondary | ICD-10-CM | POA: Diagnosis not present

## 2016-05-01 DIAGNOSIS — M545 Low back pain: Secondary | ICD-10-CM

## 2016-05-01 DIAGNOSIS — F32A Depression, unspecified: Secondary | ICD-10-CM

## 2016-05-01 MED ORDER — OXYCODONE-ACETAMINOPHEN 5-325 MG PO TABS: 1.0000 | ORAL_TABLET | ORAL | Status: DC | PRN

## 2016-05-01 MED ORDER — CIPROFLOXACIN HCL 500 MG PO TABS: 500.0000 mg | ORAL_TABLET | Freq: Two times a day (BID) | ORAL | Status: DC

## 2016-05-01 NOTE — Progress Notes (Signed)
   Subjective:    Patient ID: Joseph ForthJerry R Binegar, male    DOB: February 28, 1971, 45 y.o.   MRN: [id number] Patient arrives office with numerous concerns. HPI Patient arrives with c/o prostate issues for a while.   Patient would also like a refill of pain medication compliant with pain medicine. No obvious side effects. States deftly needs it. Ongoing challenges with back pain and leg and hip pain  Slow to diminished flow the past few weeks worsened flow  Some low back acheyness and discomfort, some dysuria. Now some nocturia. No history of prostate infections in the past  Reports ongoing challenges with divorce. Currently on a couple agents for depression. Compliant with it. No obvious side effects. States is helping some but still having challenges. No suicidal or homicidal thoughts   Review of Systems No headache, no major weight loss or weight gain, no chest pain no new B ACK pain abdominal pain no change in bowel habits complete ROS otherwise negative     Objective:   Physical Exam Alert vitals stable blood pressure good on repeat HEENT normal. Lungs clear. Heart regular in rhythm. Prostate gland boggy tender       Assessment & Plan:  Impression 1 acute prostatitis discussed #2 depression some flare due to situation compliant with meds would like to add no new medications this time to 3 chronic pain time for refills discussed meds help and in no obvious side effects deftly needs it to continue function plan pain medications refilled for several months. Diabetes discussed encourage cut down sugar intake. Maintain compliance with meds. Add Cipro 500 twice a day 21 days. Side effects benefits discussed. Stressed discussed. Importance of compliance with depression medications discussed WSL

## 2016-05-07 ENCOUNTER — Ambulatory Visit: Payer: Medicare Other | Admitting: Internal Medicine

## 2016-05-14 ENCOUNTER — Ambulatory Visit (INDEPENDENT_AMBULATORY_CARE_PROVIDER_SITE_OTHER): Payer: Medicare Other | Admitting: Family Medicine

## 2016-05-14 ENCOUNTER — Encounter: Payer: Self-pay | Admitting: Family Medicine

## 2016-05-14 VITALS — BP 130/80 | Temp 98.9°F | Ht 70.0 in | Wt 264.0 lb

## 2016-05-14 DIAGNOSIS — M545 Low back pain: Secondary | ICD-10-CM

## 2016-05-14 MED ORDER — CHLORZOXAZONE 500 MG PO TABS: 500.0000 mg | ORAL_TABLET | Freq: Three times a day (TID) | ORAL | Status: DC | PRN

## 2016-05-14 MED ORDER — ETODOLAC 400 MG PO TABS: 400.0000 mg | ORAL_TABLET | Freq: Two times a day (BID) | ORAL | Status: DC

## 2016-05-14 NOTE — Progress Notes (Signed)
   Subjective:    Patient ID: Joseph [name], male    DOB: [birth year], 45 y.o.   MRN: [id number]  Back Pain This is a new problem. The current episode started in the past 7 days. The problem occurs intermittently. The problem is unchanged. The quality of the pain is described as aching. The pain is moderate. The pain is the same all the time. Stiffness is present all day. He has tried nothing for the symptoms. The treatment provided no relief.   Patient states that he has no other concerns at this time.    Review of Systems  Musculoskeletal: Positive for back pain.   No dysuria no abdominal pain    Objective:   Physical Exam Alert vital stable lungs clear heart rare rhythm left lumbar region positive palpable spasms/soft tissue prominence near SI joint negative straight leg raise       Assessment & Plan:  Impression lumbar strain/spasm with potential fat pad prominence plan anti-inflammatory medicine muscle spasm medicine prescribed.

## 2016-05-29 ENCOUNTER — Other Ambulatory Visit: Payer: Self-pay | Admitting: *Deleted

## 2016-05-29 ENCOUNTER — Telehealth: Payer: Self-pay | Admitting: Family Medicine

## 2016-05-29 MED ORDER — ALPRAZOLAM 1 MG PO TABS: ORAL_TABLET | ORAL | Status: DC

## 2016-05-29 NOTE — Telephone Encounter (Signed)
Pt is wanting to know if he can get a script for hydrocodone  Instead of oxycodone, he feels it isn't working  Please advise

## 2016-05-29 NOTE — Progress Notes (Signed)
May give three refills over next three mo if now due

## 2016-05-29 NOTE — Telephone Encounter (Signed)
Discussed with pt. Pt verbalized understanding.  °

## 2016-05-29 NOTE — Telephone Encounter (Signed)
No change of meds until next chronic o v we do not change while on a current course of prescribed narcotics

## 2016-06-26 ENCOUNTER — Encounter: Payer: Self-pay | Admitting: Family Medicine

## 2016-06-26 ENCOUNTER — Ambulatory Visit (INDEPENDENT_AMBULATORY_CARE_PROVIDER_SITE_OTHER): Payer: Medicare Other | Admitting: Family Medicine

## 2016-06-26 VITALS — BP 154/94 | Temp 98.5°F | Ht 70.0 in | Wt 272.1 lb

## 2016-06-26 DIAGNOSIS — M5432 Sciatica, left side: Secondary | ICD-10-CM | POA: Diagnosis not present

## 2016-06-26 MED ORDER — HYDROCODONE-ACETAMINOPHEN 5-325 MG PO TABS: ORAL_TABLET | ORAL | 0 refills | Status: DC

## 2016-06-26 MED ORDER — PREDNISONE 20 MG PO TABS: ORAL_TABLET | ORAL | 0 refills | Status: DC

## 2016-06-26 NOTE — Progress Notes (Signed)
   Subjective:    Patient ID: FARZAN SALOPEK, male    DOB: 03/09/1971, 45 y.o.   MRN: [id number]  Back Pain  This is a recurrent problem. The current episode started more than 1 month ago. The problem occurs intermittently. The problem has been gradually worsening since onset. The pain is present in the lumbar spine. Radiates to: Left leg. The pain is at a severity of 4/10. The pain is moderate. Exacerbated by: Walking. He has tried muscle relaxant (Lodine, ) for the symptoms. The treatment provided no relief.   l Patient states that pain in lower back radiates and causes numbness to left leg. Pt wondered if hydrocod  Might be bdtter than oxyc codone   History of chronic pain. Injured low back many many years ago.   States no other concerns this visit.  Review of Systems  Musculoskeletal: Positive for back pain.       Objective:   Physical Exam Alert vitals stable no acute distress lungs clear heart regular in rhythm no true straight leg raise on left. Distal sensation slightly diminished pulses good some left lower lumbar tenderness to deep palpation with slight fat pad prominence over SI joint which is tender       Assessment & Plan:  Impression 1 flare of back pain with neuropathic, plan prednisone taper rationale discussed. Switch from oxycodone to hydrocodone. Allow time to see prednisone works. If persists will need an MRI. Patient had at least a dozen questions most hent of time 25 minutes in discussion.

## 2016-07-24 ENCOUNTER — Ambulatory Visit (INDEPENDENT_AMBULATORY_CARE_PROVIDER_SITE_OTHER): Payer: Medicare Other | Admitting: Family Medicine

## 2016-07-24 ENCOUNTER — Encounter: Payer: Self-pay | Admitting: Family Medicine

## 2016-07-24 VITALS — BP 132/86 | Ht 70.0 in | Wt 266.4 lb

## 2016-07-24 DIAGNOSIS — G894 Chronic pain syndrome: Secondary | ICD-10-CM

## 2016-07-24 DIAGNOSIS — M545 Low back pain: Secondary | ICD-10-CM | POA: Diagnosis not present

## 2016-07-24 NOTE — Progress Notes (Signed)
   Subjective:    Patient ID: Joseph [name], male    DOB: Jun 17, 1971, 45 y.o.   MRN: [id number]  HPI Patient arrives with c/o continued back pain .Patient returns yet again for another discussion regarding pain medicine.  He states he has visit to with orthopedics soon Dr. Romeo AppleHarrison regarding a left knee injury that occurred with his dog last week.  Continues to experience back pain.  Continues to fill swelling at the left SI joint region which we discussed on the last visit.  Was switched to hydrocodone at his own request, from oxycodone. Today he states he does not understand why he wasn't put on hydrocodone tens for his pain. States the last time he was on hydrocodone he was on 10 mg rather than 5 mg   Review of Systems    No headache, no major weight loss or weight gain, no chest pain no back pain abdominal pain no change in bowel habits complete ROS otherwise negative  Objective:   Physical Exam  Alert vitals stable, NAD. Blood pressure good on repeat. HEENT normal. Lungs clear. Heart regular rate and rhythm. Low back pain to percussion. Negative straight leg raise on left. Positive SI joint tenderness to deep palpation with prominent swelling in this region      Assessment & Plan:  Impression chronic pain medicine used discussed discuss I want him to remain on hydrocodone 5 mg. He was on higher dose 10 right around the time of meniscal tear and surgery. Also oxycodone 5 is closer to hydrocodone 5 in the equivalent transferred. We had frank discussion that every few weeks he seems to be pushing the bar regarding pain medicine and/or wanting earlier scripts new strips more medicine etc. Caution him that this continues we will definitely need to move him towards her chronic pain specialist. #2 superficial left SI joint swelling likely ligamentous doubt with represents true arthritis and definitely does not represent a disc issue. Discussed with patient patient wondered if he needs to  see a back specialist. I encouraged him first to see through with orthopedics regarding his left knee. It back pain persists we can try other interventions 25 minutes spent most in discussion

## 2016-07-29 ENCOUNTER — Ambulatory Visit (INDEPENDENT_AMBULATORY_CARE_PROVIDER_SITE_OTHER): Payer: Medicare Other

## 2016-07-29 ENCOUNTER — Encounter: Payer: Self-pay | Admitting: Orthopedic Surgery

## 2016-07-29 ENCOUNTER — Ambulatory Visit (INDEPENDENT_AMBULATORY_CARE_PROVIDER_SITE_OTHER): Payer: Medicare Other | Admitting: Orthopedic Surgery

## 2016-07-29 VITALS — BP 132/81 | Ht 70.0 in | Wt 256.0 lb

## 2016-07-29 DIAGNOSIS — Z9889 Other specified postprocedural states: Secondary | ICD-10-CM

## 2016-07-29 DIAGNOSIS — M129 Arthropathy, unspecified: Secondary | ICD-10-CM

## 2016-07-29 DIAGNOSIS — M25562 Pain in left knee: Secondary | ICD-10-CM | POA: Diagnosis not present

## 2016-07-29 DIAGNOSIS — M1712 Unilateral primary osteoarthritis, left knee: Secondary | ICD-10-CM

## 2016-07-29 NOTE — Patient Instructions (Signed)

## 2016-07-29 NOTE — Progress Notes (Signed)
Chief Complaint  Patient presents with  . Knee Injury    LEFT KNEE INJURY   HPI established patient new injury. Left knee pain. Patient status post arthroscopy partial medial meniscectomy in 2015. Set a couple of weeks of increasing left knee pain with throbbing stabbing pain over the medial joint line and popping and clicking when he stands up or goes down some stairs. He reports 8 out of 10 pain despite being on Vicodin. He is on 5 mg with a history of Percocet for various treatments as well as 10 mg hydrocodone for various treatments. However in an attempt to be compliant his primary care physician to have tapered him down to 5 mg of hydrocodone appropriately.  Review of Systems  Constitutional: Positive for malaise/fatigue.  Respiratory: Positive for cough and wheezing.   Musculoskeletal: Positive for back pain and joint pain.  Neurological: Positive for tingling and sensory change.  Endo/Heme/Allergies: Positive for environmental allergies.  Psychiatric/Behavioral: Positive for depression. The patient is nervous/anxious.     Past Medical History:  Diagnosis Date  . Anxiety   . Arthritis   . Asthma   . Chronic back pain   . Chronic pain of left knee   . COPD (chronic obstructive pulmonary disease) (HCC)   . Diabetes mellitus   . GERD (gastroesophageal reflux disease)   . Shortness of breath   . Sleep apnea     Past Surgical History:  Procedure Laterality Date  . CARPAL TUNNEL RELEASE Left   . CATARACT EXTRACTION W/PHACO Right 03/11/2016   Procedure: CATARACT EXTRACTION PHACO AND INTRAOCULAR LENS PLACEMENT (IOC);  Surgeon: Gemma Payor, MD;  Location: [facility];  Service: Ophthalmology;  Laterality: Right;  CDE 4.24  . HERNIA REPAIR Bilateral   . KNEE ARTHROSCOPY WITH MEDIAL MENISECTOMY Left 04/22/2014   Procedure: LEFT KNEE ARTHROSCOPY WITH MEDIAL MENISECTOMY;  Surgeon: Vickki Hearing, MD;  Location: [facility];  Service: Orthopedics;  Laterality: Left;  . KNEE ARTHROSCOPY WITH  MEDIAL MENISECTOMY Right 12/06/2015   Procedure: RIGHT KNEE ARTHROSCOPY WITH MEDIAL MENISECTOMY;  Surgeon: Vickki Hearing, MD;  Location: [facility];  Service: Orthopedics;  Laterality: Right;  . SHOULDER ARTHROCENTESIS Right   . VASECTOMY     Family History  Problem Relation Age of Onset  . Emphysema Maternal Grandfather   . Emphysema Maternal Grandmother   . Clotting disorder Maternal Grandmother    Social History  Substance Use Topics  . Smoking status: Current Every Day Smoker    Packs/day: 1.50    Years: 20.00    Types: Cigarettes    Start date: 12/17/1993  . Smokeless tobacco: Current User  . Alcohol use No    Current Outpatient Prescriptions:  .  ACAI PO, Take 1 tablet by mouth daily., Disp: , Rfl:  .  albuterol (PROAIR HFA) 108 (90 BASE) MCG/ACT inhaler, 2 puffs every 4 hours if needed, Disp: 1 Inhaler, Rfl: 11 .  albuterol (PROVENTIL) (2.5 MG/3ML) 0.083% nebulizer solution, USE 1 VIAL IN NEBULIZER FOUR TIMES DAILY, Disp: 360 mL, Rfl: 5 .  ALPRAZolam (XANAX) 1 MG tablet, TAKE ONE TABLET TWICE DAILY AS NEEDED FOR ANXIETY, Disp: 60 tablet, Rfl: 3 .  ANDROGEL PUMP 20.25 MG/ACT (1.62%) GEL, APPLY TWO PUMPS ONCE DAILY, Disp: 75 g, Rfl: 5 .  aspirin 325 MG EC tablet, Take 325 mg by mouth 2 (two) times daily. , Disp: , Rfl:  .  buPROPion (WELLBUTRIN SR) 150 MG 12 hr tablet, TAKE ONE TABLET DAILY FOR THREE DAYS, THEN 1 TABLET  TWICE DAILY., Disp: 60 tablet, Rfl: 5 .  citalopram (CELEXA) 40 MG tablet, TAKE ONE TABLET ONCE DAILY, Disp: 30 tablet, Rfl: 5 .  DULERA 200-5 MCG/ACT AERO, USE 2 PUFFS FIRST THING IN THE MORNING AND THEN ANOTHER 2 PUFFS ABOUT12 HOURS LATER, Disp: 13 g, Rfl: 2 .  glipiZIDE (GLUCOTROL) 5 MG tablet, Take 2 tabs bid, Disp: 360 tablet, Rfl: 1 .  glucosamine-chondroitin 500-400 MG tablet, Take 1 tablet by mouth 3 (three) times daily., Disp: , Rfl:  .  HYDROcodone-acetaminophen (NORCO/VICODIN) 5-325 MG tablet, Take 1 tablet every 6 hours as needed for pain, Disp: 120  tablet, Rfl: 0 .  ipratropium (ATROVENT) 0.02 % nebulizer solution, USE 1 VIAL IN NEBULIZER FOUR TIMES DAILY, Disp: 300 mL, Rfl: 5 .  lansoprazole (PREVACID) 30 MG capsule, TAKE ONE CAPSULE BY MOUTH TWICE A DAY BEFORE A MEAL, Disp: 60 capsule, Rfl: 5 .  Multiple Vitamin (MULTIVITAMIN WITH MINERALS) TABS, Take 1 tablet by mouth every morning. Men's once daily multivitamin packet (vitamin e, calcium, ginseng), Disp: , Rfl:  .  Probiotic Product (PROBIOTIC PO), Take 1 tablet by mouth daily., Disp: , Rfl:  .  Saw Palmetto, Serenoa repens, (SAW PALMETTO PO), Take 1 capsule by mouth daily., Disp: , Rfl:  .  sildenafil (REVATIO) 20 MG tablet, 2-3 tablets po prn, Disp: 20 tablet, Rfl: 11 .  oxyCODONE-acetaminophen (PERCOCET/ROXICET) 5-325 MG tablet, Take 1 tablet by mouth every 4 (four) hours as needed for severe pain., Disp: 120 tablet, Rfl: 0  BP 132/81   Ht 5\' 10"  (1.778 m)   Wt 256 lb (116.1 kg)   BMI 36.73 kg/m   Physical Exam  Constitutional: He is oriented to person, place, and time. He appears well-developed and well-nourished. No distress.  Cardiovascular: Normal rate and intact distal pulses.   Neurological: He is alert and oriented to person, place, and time.  Skin: Skin is warm and dry. No rash noted. He is not diaphoretic. No erythema. No pallor.  Psychiatric: He has a normal mood and affect. His behavior is normal. Judgment and thought content normal.    Ortho Exam He seems to be walking normally  He has tenderness in his lower back and left SI joint  Left knee medial joint line tenderness patellofemoral crepitance painful patellofemoral compression range of motion stability tests were normal. Strength tests were normal. Skin normal. Pulses normal. Sensation normal.    ASSESSMENT: My personal interpretation of the images:  X-rays show mild joint space narrowing medially consistent with osteoarthritis medial compartment  He has a history of unsuccessful use of NSAIDs and has  some opioid tolerance although he's been compliant with his medication.  So he will be difficult to get under control in terms of pain. He does not need knee replacement surgery. Does not need arthroscopic surgery at this time.  Recommend injection  Procedure note left knee injection verbal consent was obtained to inject left knee joint  Timeout was completed to confirm the site of injection  The medications used were 40 mg of Depo-Medrol and 1% lidocaine 3 cc  Anesthesia was provided by ethyl chloride and the skin was prepped with alcohol.  After cleaning the skin with alcohol a 20-gauge needle was used to inject the left knee joint. There were no complications. A sterile bandage was applied.     PLAN We planned x-ray is back on the next visit to address the numbness and tingling in the left leg  Fuller CanadaStanley Yishai Rehfeld, MD 07/29/2016 4:31 PM

## 2016-08-01 ENCOUNTER — Encounter: Payer: Self-pay | Admitting: Family Medicine

## 2016-08-01 ENCOUNTER — Ambulatory Visit (INDEPENDENT_AMBULATORY_CARE_PROVIDER_SITE_OTHER): Payer: Medicare Other | Admitting: Family Medicine

## 2016-08-01 VITALS — BP 130/94 | Ht 69.5 in | Wt 254.0 lb

## 2016-08-01 DIAGNOSIS — E785 Hyperlipidemia, unspecified: Secondary | ICD-10-CM | POA: Diagnosis not present

## 2016-08-01 DIAGNOSIS — Z13220 Encounter for screening for lipoid disorders: Secondary | ICD-10-CM | POA: Diagnosis not present

## 2016-08-01 DIAGNOSIS — Z0000 Encounter for general adult medical examination without abnormal findings: Secondary | ICD-10-CM

## 2016-08-01 DIAGNOSIS — R5383 Other fatigue: Secondary | ICD-10-CM

## 2016-08-01 DIAGNOSIS — Z79899 Other long term (current) drug therapy: Secondary | ICD-10-CM

## 2016-08-01 DIAGNOSIS — E119 Type 2 diabetes mellitus without complications: Secondary | ICD-10-CM | POA: Diagnosis not present

## 2016-08-01 DIAGNOSIS — E291 Testicular hypofunction: Secondary | ICD-10-CM

## 2016-08-01 DIAGNOSIS — R7989 Other specified abnormal findings of blood chemistry: Secondary | ICD-10-CM

## 2016-08-01 LAB — POCT GLYCOSYLATED HEMOGLOBIN (HGB A1C): HEMOGLOBIN A1C: 8.6

## 2016-08-01 MED ORDER — LANSOPRAZOLE 30 MG PO CPDR: DELAYED_RELEASE_CAPSULE | ORAL | 5 refills | Status: DC

## 2016-08-01 MED ORDER — BUPROPION HCL ER (SR) 150 MG PO TB12: ORAL_TABLET | ORAL | 5 refills | Status: DC

## 2016-08-01 MED ORDER — CITALOPRAM HYDROBROMIDE 40 MG PO TABS: 40.0000 mg | ORAL_TABLET | Freq: Every day | ORAL | 5 refills | Status: DC

## 2016-08-01 MED ORDER — GLIPIZIDE 5 MG PO TABS: ORAL_TABLET | ORAL | 1 refills | Status: DC
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
## 2016-08-02 DIAGNOSIS — E119 Type 2 diabetes mellitus without complications: Secondary | ICD-10-CM | POA: Diagnosis not present

## 2016-08-02 DIAGNOSIS — E291 Testicular hypofunction: Secondary | ICD-10-CM | POA: Diagnosis not present

## 2016-08-02 DIAGNOSIS — R5383 Other fatigue: Secondary | ICD-10-CM | POA: Diagnosis not present

## 2016-08-02 DIAGNOSIS — E785 Hyperlipidemia, unspecified: Secondary | ICD-10-CM | POA: Diagnosis not present

## 2016-08-02 DIAGNOSIS — Z79899 Other long term (current) drug therapy: Secondary | ICD-10-CM | POA: Diagnosis not present

## 2016-08-03 LAB — MICROALBUMIN / CREATININE URINE RATIO
CREATININE, UR: 242.4 mg/dL
MICROALB/CREAT RATIO: 46.7 mg/g creat — ABNORMAL HIGH (ref 0.0–30.0)
MICROALBUM., U, RANDOM: 113.1 ug/mL

## 2016-08-03 LAB — TSH: TSH: 1.7 u[IU]/mL (ref 0.450–4.500)

## 2016-08-03 LAB — LIPID PANEL
CHOL/HDL RATIO: 8 ratio — AB (ref 0.0–5.0)
Cholesterol, Total: 239 mg/dL — ABNORMAL HIGH (ref 100–199)
HDL: 30 mg/dL — AB (ref >39)
Triglycerides: 422 mg/dL — ABNORMAL HIGH (ref 0–149)

## 2016-08-03 LAB — BASIC METABOLIC PANEL
BUN/Creatinine Ratio: 7 — ABNORMAL LOW (ref 9–20)
BUN: 6 mg/dL (ref 6–24)
CALCIUM: 9.8 mg/dL (ref 8.7–10.2)
CO2: 25 mmol/L (ref 18–29)
CREATININE: 0.86 mg/dL (ref 0.76–1.27)
Chloride: 97 mmol/L (ref 96–106)
GFR calc Af Amer: 120 mL/min/{1.73_m2} (ref >59)
GFR calc non Af Amer: 104 mL/min/{1.73_m2} (ref >59)
GLUCOSE: 262 mg/dL — AB (ref 65–99)
Potassium: 4.7 mmol/L (ref 3.5–5.2)
SODIUM: 139 mmol/L (ref 134–144)

## 2016-08-03 LAB — HEPATIC FUNCTION PANEL
ALK PHOS: 75 IU/L (ref 39–117)
ALT: 24 IU/L (ref 0–44)
AST: 13 IU/L (ref 0–40)
Albumin: 4.6 g/dL (ref 3.5–5.5)
Bilirubin Total: 0.5 mg/dL (ref 0.0–1.2)
Bilirubin, Direct: 0.12 mg/dL (ref 0.00–0.40)
TOTAL PROTEIN: 6.8 g/dL (ref 6.0–8.5)

## 2016-08-03 LAB — TESTOSTERONE: TESTOSTERONE: 212 ng/dL — AB (ref 264–916)

## 2016-08-05 ENCOUNTER — Encounter: Payer: Self-pay | Admitting: Family Medicine

## 2016-08-18 ENCOUNTER — Emergency Department (HOSPITAL_COMMUNITY)
Admission: EM | Admit: 2016-08-18 | Discharge: 2016-08-18 | Disposition: A | Discharge status: Home / Self Care | Payer: Medicare Other | Attending: Dermatology | Admitting: Dermatology

## 2016-08-18 ENCOUNTER — Encounter (HOSPITAL_COMMUNITY): Payer: Self-pay | Admitting: Emergency Medicine

## 2016-08-18 DIAGNOSIS — J449 Chronic obstructive pulmonary disease, unspecified: Secondary | ICD-10-CM | POA: Insufficient documentation

## 2016-08-18 DIAGNOSIS — J45909 Unspecified asthma, uncomplicated: Secondary | ICD-10-CM | POA: Diagnosis not present

## 2016-08-18 DIAGNOSIS — F17210 Nicotine dependence, cigarettes, uncomplicated: Secondary | ICD-10-CM | POA: Insufficient documentation

## 2016-08-18 DIAGNOSIS — Z79899 Other long term (current) drug therapy: Secondary | ICD-10-CM | POA: Diagnosis not present

## 2016-08-18 DIAGNOSIS — Z5321 Procedure and treatment not carried out due to patient leaving prior to being seen by health care provider: Secondary | ICD-10-CM | POA: Insufficient documentation

## 2016-08-18 DIAGNOSIS — Z7984 Long term (current) use of oral hypoglycemic drugs: Secondary | ICD-10-CM | POA: Insufficient documentation

## 2016-08-18 DIAGNOSIS — M545 Low back pain: Secondary | ICD-10-CM | POA: Insufficient documentation

## 2016-08-18 LAB — CBG MONITORING, ED: Glucose-Capillary: 358 mg/dL — ABNORMAL HIGH (ref 65–99)

## 2016-08-18 NOTE — ED Notes (Signed)
PT called from waiting room x3 with no answer.

## 2016-08-18 NOTE — ED Triage Notes (Addendum)
Patient c/o lower back pain that radiates into left leg. Per patient pain over 3 weeks. Patient reports numbness and tingling. Patient also reports urinary incontinence. Per patient has knot on back but has seen Dr Gerda DissLuking for same reason and was told more related to spine. Patient to see Dr Saddie BendersHariston. Dr Gerda DissLuking with same complaint. Patient given narcotics, muscle relaxer, and prednisone. Per patient prednisone did help some with pain but patient is diabetic and sugars were elevated. Per patient has taken 3 Vicodin 5/325mg  with no relief.

## 2016-08-20 ENCOUNTER — Encounter (HOSPITAL_COMMUNITY): Payer: Self-pay | Admitting: Emergency Medicine

## 2016-08-20 ENCOUNTER — Emergency Department (HOSPITAL_COMMUNITY): Payer: Medicare Other

## 2016-08-20 ENCOUNTER — Emergency Department (HOSPITAL_COMMUNITY)
Admission: EM | Admit: 2016-08-20 | Discharge: 2016-08-20 | Disposition: A | Discharge status: Home / Self Care | Payer: Medicare Other | Attending: Emergency Medicine | Admitting: Emergency Medicine

## 2016-08-20 DIAGNOSIS — Z79899 Other long term (current) drug therapy: Secondary | ICD-10-CM | POA: Insufficient documentation

## 2016-08-20 DIAGNOSIS — M2550 Pain in unspecified joint: Secondary | ICD-10-CM | POA: Insufficient documentation

## 2016-08-20 DIAGNOSIS — G8929 Other chronic pain: Secondary | ICD-10-CM | POA: Diagnosis not present

## 2016-08-20 DIAGNOSIS — R450 Nervousness: Secondary | ICD-10-CM | POA: Diagnosis not present

## 2016-08-20 DIAGNOSIS — M79605 Pain in left leg: Secondary | ICD-10-CM | POA: Diagnosis not present

## 2016-08-20 DIAGNOSIS — J45909 Unspecified asthma, uncomplicated: Secondary | ICD-10-CM | POA: Insufficient documentation

## 2016-08-20 DIAGNOSIS — M545 Low back pain: Secondary | ICD-10-CM | POA: Diagnosis not present

## 2016-08-20 DIAGNOSIS — F17210 Nicotine dependence, cigarettes, uncomplicated: Secondary | ICD-10-CM | POA: Diagnosis not present

## 2016-08-20 DIAGNOSIS — J449 Chronic obstructive pulmonary disease, unspecified: Secondary | ICD-10-CM | POA: Insufficient documentation

## 2016-08-20 DIAGNOSIS — E119 Type 2 diabetes mellitus without complications: Secondary | ICD-10-CM | POA: Diagnosis not present

## 2016-08-20 DIAGNOSIS — M549 Dorsalgia, unspecified: Secondary | ICD-10-CM | POA: Insufficient documentation

## 2016-08-20 DIAGNOSIS — Z7984 Long term (current) use of oral hypoglycemic drugs: Secondary | ICD-10-CM | POA: Diagnosis not present

## 2016-08-20 NOTE — ED Triage Notes (Signed)
Pt came in Sunday. Dr. Romeo AppleHarrison said he might have something wrong with lower back. Pt left AMA because pain medications wore off. Pt states that his left leg is feeling numb. Pt states that pain has been going on for 3-4 months.

## 2016-08-20 NOTE — Discharge Instructions (Signed)
There are no gross neurologic deficits at this time. Xray of the lumbar spine reveals degenerative changes, and stable compression deformities of the lower thoracic spine. No acute change noted on plain film. Please see Dr Romeo AppleHarrison for additional evaluation and management.

## 2016-08-20 NOTE — ED Provider Notes (Signed)
[facility] Provider Note   CSN: [id number] Arrival date & time: 08/20/16  1251     History   Chief Complaint Chief Complaint  Patient presents with  . Back Pain    HPI Joseph [name] is a 45 y.o. male.  Patient is a 45 year old male who presents to the emergency department with a complaint of left leg pain and numbness.  The patient states that for the last 3 or 4 months he's been dealing with problems with his left leg. He has discussed this with his primary physician, he was tried on steroids, muscle relaxants, and pain medication. He states that the steroids seem to help some, but it made his sugar go up to over 500. The patient was sent to Dr. Romeo Apple for orthopedic evaluation of his knee, and during that time Dr. Romeo Apple suggested that the patient may have some back related issue. The patient states he is here today because the pain is becoming more and more intense and difficult to deal with. He presents at this time requesting assistance with his pain, and to have an x-ray done to begin the workup with Dr. Romeo Apple.      Past Medical History:  Diagnosis Date  . Anxiety   . Arthritis   . Asthma   . Chronic back pain   . Chronic pain of left knee   . COPD (chronic obstructive pulmonary disease) (HCC)   . Diabetes mellitus   . GERD (gastroesophageal reflux disease)   . Shortness of breath   . Sleep apnea     Patient Active Problem List   Diagnosis Date Noted  . Bucket handle tear of meniscus of right knee   . Depression 10/07/2015  . Morbid obesity (HCC) 10/06/2015  . OSA (obstructive sleep apnea) 08/31/2014  . Low serum testosterone level 07/01/2014  . S/P knee surgery 04/26/2014  . Acute meniscal tear of left knee 04/26/2014  . Medial meniscus, posterior horn derangement 04/22/2014  . Intrinsic asthma 12/19/2013  . Chronic pain syndrome 05/13/2013  . Diabetes (HCC) 05/13/2013  . Dyspnea 08/14/2012  . Mediastinal abnormality 04/10/2012  .  Cigarette smoker 04/10/2012  . ABDOMINAL PAIN, GENERALIZED, CHRONIC 10/03/2009  . INGUINAL PAIN, LEFT 10/03/2009    Past Surgical History:  Procedure Laterality Date  . CARPAL TUNNEL RELEASE Left   . CATARACT EXTRACTION W/PHACO Right 03/11/2016   Procedure: CATARACT EXTRACTION PHACO AND INTRAOCULAR LENS PLACEMENT (IOC);  Surgeon: Gemma Payor, MD;  Location: [facility];  Service: Ophthalmology;  Laterality: Right;  CDE 4.24  . HERNIA REPAIR Bilateral   . KNEE ARTHROSCOPY WITH MEDIAL MENISECTOMY Left 04/22/2014   Procedure: LEFT KNEE ARTHROSCOPY WITH MEDIAL MENISECTOMY;  Surgeon: Vickki Hearing, MD;  Location: [facility];  Service: Orthopedics;  Laterality: Left;  . KNEE ARTHROSCOPY WITH MEDIAL MENISECTOMY Right 12/06/2015   Procedure: RIGHT KNEE ARTHROSCOPY WITH MEDIAL MENISECTOMY;  Surgeon: Vickki Hearing, MD;  Location: [facility];  Service: Orthopedics;  Laterality: Right;  . SHOULDER ARTHROCENTESIS Right   . VASECTOMY         Home Medications    Prior to Admission medications   Medication Sig Start Date End Date Taking? Authorizing Provider  ACAI PO Take 1 tablet by mouth daily.    Historical Provider, MD  albuterol (PROAIR HFA) 108 (90 BASE) MCG/ACT inhaler 2 puffs every 4 hours if needed 03/08/15   Nyoka Cowden, MD  albuterol (PROVENTIL) (2.5 MG/3ML) 0.083% nebulizer solution USE 1 VIAL IN NEBULIZER FOUR TIMES DAILY 04/16/16  Merlyn AlbertWilliam S Luking, MD  ALPRAZolam [name](XANAX) 1 MG tablet TAKE ONE TABLET TWICE DAILY AS NEEDED FOR ANXIETY 05/29/16   Merlyn AlbertWilliam S Luking, MD  ANDROGEL PUMP 20.25 MG/ACT (1.62%) GEL APPLY TWO PUMPS ONCE DAILY Patient not taking: Reported on 08/01/2016 11/16/15   Merlyn AlbertWilliam S Luking, MD  aspirin 325 MG EC tablet Take 325 mg by mouth 2 (two) times daily.     Historical Provider, MD  buPROPion (WELLBUTRIN SR) 150 MG 12 hr tablet TAKE ONE TABLET DAILY FOR THREE DAYS, THEN 1 TABLET TWICE DAILY. 08/01/16   Merlyn AlbertWilliam S Luking, MD  citalopram (CELEXA) 40 MG tablet Take 1 tablet (40 mg  total) by mouth daily. 08/01/16   Merlyn AlbertWilliam S Luking, MD  DULERA 200-5 MCG/ACT AERO USE 2 PUFFS FIRST THING IN THE MORNING AND THEN ANOTHER 2 PUFFS ABOUT12 HOURS LATER 04/01/16   Nyoka CowdenMichael B Wert, MD  glipiZIDE (GLUCOTROL) 5 MG tablet Take 2 tabs bid 08/01/16   Merlyn AlbertWilliam S Luking, MD  glucosamine-chondroitin 500-400 MG tablet Take 1 tablet by mouth 3 (three) times daily.    Historical Provider, MD  HYDROcodone-acetaminophen (NORCO/VICODIN) 5-325 MG tablet Take 1 tablet every 6 hours as needed for pain 06/26/16   Merlyn AlbertWilliam S Luking, MD  ipratropium (ATROVENT) 0.02 % nebulizer solution USE 1 VIAL IN NEBULIZER FOUR TIMES DAILY 04/16/16   Merlyn AlbertWilliam S Luking, MD  lansoprazole (PREVACID) 30 MG capsule TAKE ONE CAPSULE BY MOUTH TWICE A DAY BEFORE A MEAL 08/01/16   Merlyn AlbertWilliam S Luking, MD  Multiple Vitamin (MULTIVITAMIN WITH MINERALS) TABS Take 1 tablet by mouth every morning. Men's once daily multivitamin packet (vitamin e, calcium, ginseng)    Historical Provider, MD  Probiotic Product (PROBIOTIC PO) Take 1 tablet by mouth daily.    Historical Provider, MD  Saw Palmetto, Serenoa repens, (SAW PALMETTO PO) Take 1 capsule by mouth daily.    Historical Provider, MD  sildenafil (REVATIO) 20 MG tablet 2-3 tablets po prn 08/22/15   Merlyn AlbertWilliam S Luking, MD    Family History Family History  Problem Relation Age of Onset  . Emphysema Maternal Grandfather   . Emphysema Maternal Grandmother   . Clotting disorder Maternal Grandmother     Social History Social History  Substance Use Topics  . Smoking status: Current Every Day Smoker    Packs/day: 1.50    Years: 20.00    Types: Cigarettes    Start date: 12/17/1993  . Smokeless tobacco: Current User  . Alcohol use No     Allergies   Azithromycin; Erythromycin; Keflex [cephalexin]; Metformin; Symbicort [budesonide-formoterol fumarate]; and Doxycycline   Review of Systems Review of Systems  Musculoskeletal: Positive for arthralgias and back pain.  Psychiatric/Behavioral:  The patient is nervous/anxious.   All other systems reviewed and are negative.    Physical Exam Updated Vital Signs BP 156/89 (BP Location: Left Arm)   Pulse 87   Temp 99.4 F (37.4 C) (Oral)   Resp 18   Ht 5\' 10"  (1.778 m)   Wt 117.9 kg   SpO2 98%   BMI 37.31 kg/m   Physical Exam  Constitutional: He is oriented to person, place, and time. He appears well-developed and well-nourished.  Non-toxic appearance.  HENT:  Head: Normocephalic.  Right Ear: Tympanic membrane and external ear normal.  Left Ear: Tympanic membrane and external ear normal.  Eyes: EOM and lids are normal. Pupils are equal, round, and reactive to light.  Neck: Normal range of motion. Neck supple. Carotid bruit is not present.  Cardiovascular: Normal  rate, regular rhythm, normal heart sounds, intact distal pulses and normal pulses.   Pulmonary/Chest: Breath sounds normal. No respiratory distress.  Abdominal: Soft. Bowel sounds are normal. There is no tenderness. There is no guarding.  Musculoskeletal: Normal range of motion.  There is minimal pain to palpation of the mid lumbar area. There is pain in the paraspinal area and also near the sciatic notch. No hot areas appreciated.  There is discomfort with range of motion of right and left knee.  Lymphadenopathy:       Head (right side): No submandibular adenopathy present.       Head (left side): No submandibular adenopathy present.    He has no cervical adenopathy.  Neurological: He is alert and oriented to person, place, and time. He has normal strength. No cranial nerve deficit or sensory deficit.  Gait is intact. There are no gross motor or sensory deficits appreciated.  Skin: Skin is warm and dry.  Psychiatric: He has a normal mood and affect. His speech is normal.  Nursing note and vitals reviewed.    ED Treatments / Results  Labs (all labs ordered are listed, but only abnormal results are displayed) Labs Reviewed - No data to display  EKG  EKG  Interpretation None       Radiology No results found.  Procedures Procedures (including critical care time)  Medications Ordered in ED Medications - No data to display   Initial Impression / Assessment and Plan / ED Course  I have reviewed the triage vital signs and the nursing notes.  Pertinent labs & imaging results that were available during my care of the patient were reviewed by me and considered in my medical decision making (see chart for details).  Clinical Course    *I have reviewed nursing notes, vital signs, and all appropriate lab and imaging results for this patient.**  Final Clinical Impressions(s) / ED Diagnoses  Vital signs reviewed. Gait is intact, balance is intact. There is no evidence of caudal equina, or other emergent changes. Xray of the L spine reveals degenerative changes and stable compression changes of the T spine. Suggested pt see Dr Romeo Apple as soon as possible for evaluation and management.   Final diagnoses:  Chronic back pain    New Prescriptions New Prescriptions   No medications on file     Ivery Quale, PA-C 08/20/16 2256    Blane Ohara, MD 08/23/16 [phone]

## 2016-08-21 ENCOUNTER — Encounter: Payer: Self-pay | Admitting: Family Medicine

## 2016-08-21 ENCOUNTER — Telehealth: Payer: Self-pay | Admitting: Orthopedic Surgery

## 2016-08-21 ENCOUNTER — Ambulatory Visit (INDEPENDENT_AMBULATORY_CARE_PROVIDER_SITE_OTHER): Payer: Medicare Other | Admitting: Family Medicine

## 2016-08-21 VITALS — BP 128/82 | Ht 69.5 in | Wt 267.8 lb

## 2016-08-21 DIAGNOSIS — R21 Rash and other nonspecific skin eruption: Secondary | ICD-10-CM

## 2016-08-21 DIAGNOSIS — M5432 Sciatica, left side: Secondary | ICD-10-CM | POA: Diagnosis not present

## 2016-08-21 DIAGNOSIS — G894 Chronic pain syndrome: Secondary | ICD-10-CM

## 2016-08-21 MED ORDER — DOXYCYCLINE HYCLATE 100 MG PO TABS: 100.0000 mg | ORAL_TABLET | Freq: Two times a day (BID) | ORAL | 0 refills | Status: DC

## 2016-08-21 NOTE — Telephone Encounter (Signed)
Patient called, states seen at [name] last evening, due to increasing pain in back; states had Xrays done (lumbar spine). States also seeing his primary care, Dr Gerda DissLuking today, 08/21/16. Patient already has appointment here on Tuesday, 08/27/16; relays that Xrays are in his chart in event Dr Romeo AppleHarrison would like to review prior to appointment.  Patient's cell#[phone]

## 2016-08-21 NOTE — Telephone Encounter (Signed)
Patient aware.

## 2016-08-21 NOTE — Progress Notes (Signed)
   Subjective:    Patient ID: Joseph [name], male    DOB: 05-Dec-1970, 45 y.o.   MRN: [id number] Patient arrives office ostensibly for what is supposedly a visit for rash. But he has other concerns to discuss. HPI Patient arrives with c/o rash on neck for about a week- has tried neosporin but it has not helped.  Started abpot a week and a half ago, started up as boils and inflammed bumps  One stareted leaking   Pos tenderness and pain  Sensitive and tender  Patient also goes into great length on his progressive difficulties with pain. Please see emergency room note. Reviewed in front of patient. Please see orthopedist notes. Please see our prior notes regarding chronic pain. Patient has had an issue with this for years. It seems to be only getting worse and worse. Patient very frustrated that hydrocodone alone does not control his pain. He has pain from a orthopedic standpoint in both knees and shoulders. He also has neuropathic pain from the back and even now some spinal pain. Please see x-ray results. Next    Review of Systems No headache, no major weight loss or weight gain, no chest pain positive back pain abdominal pain no change in bowel habits complete ROS otherwise negative     Objective:   Physical Exam  Alert vital stable pleasant lungs clear heart regular rhythm multiple discrete impetigo-like lesions on back of neck and upper shoulders. Multiple discrete folliculitis regions anterior neck.      Assessment & Plan:  Impression 1 folliculitis/impetigo potential for MRSA discussed at length #2 chronic pain very long discussion was held. Patient immensely frustrated by ongoing pain. Would like to press on and see a pain specialist for other potential interventions. I think this is very reasonable. 25 minutes spent most in discussion.

## 2016-08-21 NOTE — Telephone Encounter (Signed)
So noted no change in treatment

## 2016-08-22 ENCOUNTER — Ambulatory Visit: Payer: Medicare Other | Admitting: Pulmonary Disease

## 2016-08-23 ENCOUNTER — Encounter: Payer: Self-pay | Admitting: Family Medicine

## 2016-08-26 ENCOUNTER — Ambulatory Visit: Payer: Medicare Other | Admitting: Pulmonary Disease

## 2016-08-26 ENCOUNTER — Telehealth: Payer: Self-pay | Admitting: Family Medicine

## 2016-08-26 MED ORDER — HYDROCODONE-ACETAMINOPHEN 5-325 MG PO TABS: ORAL_TABLET | ORAL | 0 refills | Status: DC

## 2016-08-26 NOTE — Telephone Encounter (Signed)
Prescription upfront for pick up. Patient notified. 

## 2016-08-26 NOTE — Telephone Encounter (Signed)
[facility]/16 [facility].

## 2016-08-26 NOTE — Telephone Encounter (Signed)
Ok times one mo 

## 2016-08-26 NOTE — Telephone Encounter (Signed)
Pt is requesting a refill on his HYDROcodone-acetaminophen (NORCO/VICODIN) 5-325 MG tablet  

## 2016-08-27 ENCOUNTER — Encounter: Payer: Self-pay | Admitting: Orthopedic Surgery

## 2016-08-27 ENCOUNTER — Ambulatory Visit (INDEPENDENT_AMBULATORY_CARE_PROVIDER_SITE_OTHER): Payer: Medicare Other | Admitting: Orthopedic Surgery

## 2016-08-27 VITALS — BP 141/95 | HR 89 | Ht 69.5 in | Wt 265.0 lb

## 2016-08-27 DIAGNOSIS — M5441 Lumbago with sciatica, right side: Secondary | ICD-10-CM | POA: Diagnosis not present

## 2016-08-27 DIAGNOSIS — M48061 Spinal stenosis, lumbar region without neurogenic claudication: Secondary | ICD-10-CM

## 2016-08-27 DIAGNOSIS — M4806 Spinal stenosis, lumbar region: Secondary | ICD-10-CM

## 2016-08-27 MED ORDER — NABUMETONE 500 MG PO TABS: 500.0000 mg | ORAL_TABLET | Freq: Two times a day (BID) | ORAL | 5 refills | Status: DC

## 2016-08-27 NOTE — Progress Notes (Signed)
Patient ID: Joseph [name], male   DOB: 30-Oct-1971, 45 y.o.   MRN: [id number]  Chief Complaint  Patient presents with  . Follow-up    4 week recheck on back and review xrays.    HPI Joseph ForthJerry R Marotto is a 45 y.o. male.   HPI  45 year old male with long-standing history of lower back pain with radiculopathy into his left leg prior treatment included NSAIDs but progressed to opioid therapy including Percocet. He was recently taken off the Percocet tried hydrocodone 5 mg but has had to take 2 tablets for pain relief. Basically has ongoing lower back pain which is a dull ache which radiates down his left leg associated with numbness tingling and weakness in his left foot  Review of Systems Review of Systems  Cardiovascular: Negative.   Gastrointestinal: Negative.   Genitourinary: Negative.    Past Medical History:  Diagnosis Date  . Anxiety   . Arthritis   . Asthma   . Chronic back pain   . Chronic pain of left knee   . COPD (chronic obstructive pulmonary disease) (HCC)   . Diabetes mellitus   . GERD (gastroesophageal reflux disease)   . Shortness of breath   . Sleep apnea     Physical Exam BP (!) 141/95   Pulse 89   Ht 5' 9.5" (1.765 m)   Wt 265 lb (120.2 kg)   BMI 38.57 kg/m   Physical Exam  Constitutional: He is oriented to person, place, and time. He appears well-developed and well-nourished. No distress.  Cardiovascular: Normal rate and intact distal pulses.   Neurological: He is alert and oriented to person, place, and time.  Skin: Skin is warm and dry. No rash noted. He is not diaphoretic. No erythema. No pallor.  Psychiatric: He has a normal mood and affect. His behavior is normal. Judgment and thought content normal.   Gait seems to be normal.  His back is tender in his range of motion in his back is decreased flexion extension with pain especially with flexion. His hips knees and ankles are stable on the right and left leg strength is normal on the right leg he has  mild weakness dorsiflexion on the left leg skin is intact back and both legs  Pulses are normal distally  He has decreased sensation in L5 lateral leg top of foot  Reflexes show no pathologic reflexes positive straight leg raise on the left with blunted reflexes at the ankle in 1-2+ both knees  Encounter Diagnoses  Name Primary?  . Right-sided low back pain with right-sided sciatica Yes  . Spinal stenosis of lumbar region     MRI L SPINE

## 2016-08-29 ENCOUNTER — Other Ambulatory Visit: Payer: Self-pay | Admitting: *Deleted

## 2016-08-29 ENCOUNTER — Telehealth: Payer: Self-pay | Admitting: Family Medicine

## 2016-08-29 NOTE — Telephone Encounter (Signed)
[facility]  Also, wanted to let Dr. Brett CanalesSteve know that pain management accepted him.

## 2016-08-30 MED ORDER — ALPRAZOLAM 1 MG PO TABS: ORAL_TABLET | ORAL | 5 refills | Status: DC

## 2016-08-30 NOTE — Telephone Encounter (Signed)
Left message return call 08/30/16 prescription printed and awaiting signature to be faxed.

## 2016-08-30 NOTE — Telephone Encounter (Signed)
Ok plus five ref 

## 2016-09-02 ENCOUNTER — Ambulatory Visit (HOSPITAL_COMMUNITY): Admission: RE | Admit: 2016-09-02 | Payer: Medicare Other | Source: Ambulatory Visit

## 2016-09-03 ENCOUNTER — Encounter: Payer: Self-pay | Admitting: Pulmonary Disease

## 2016-09-03 ENCOUNTER — Ambulatory Visit (INDEPENDENT_AMBULATORY_CARE_PROVIDER_SITE_OTHER): Payer: Medicare Other | Admitting: Pulmonary Disease

## 2016-09-03 ENCOUNTER — Ambulatory Visit (HOSPITAL_COMMUNITY)
Admission: RE | Admit: 2016-09-03 | Discharge: 2016-09-03 | Disposition: A | Discharge status: Home / Self Care | Payer: Medicare Other | Source: Ambulatory Visit | Attending: Orthopedic Surgery | Admitting: Orthopedic Surgery

## 2016-09-03 DIAGNOSIS — M48061 Spinal stenosis, lumbar region without neurogenic claudication: Secondary | ICD-10-CM | POA: Diagnosis not present

## 2016-09-03 DIAGNOSIS — Z23 Encounter for immunization: Secondary | ICD-10-CM | POA: Diagnosis not present

## 2016-09-03 DIAGNOSIS — M47816 Spondylosis without myelopathy or radiculopathy, lumbar region: Secondary | ICD-10-CM | POA: Insufficient documentation

## 2016-09-03 DIAGNOSIS — F17210 Nicotine dependence, cigarettes, uncomplicated: Secondary | ICD-10-CM

## 2016-09-03 DIAGNOSIS — G4733 Obstructive sleep apnea (adult) (pediatric): Secondary | ICD-10-CM | POA: Diagnosis not present

## 2016-09-03 NOTE — Assessment & Plan Note (Signed)
CPAP is set at 10 cm and seems to be working well. CPAP supplies will be renewed for a year  Weight loss encouraged, compliance with goal of at least 4-6 hrs every night is the expectation. Advised against medications with sedative side effects Cautioned against driving when sleepy - understanding that sleepiness will vary on a day to day basis  

## 2016-09-03 NOTE — Patient Instructions (Signed)
CPAP is set at 10 cm and seems to be working well. CPAP supplies will be renewed for a year 

## 2016-09-03 NOTE — Progress Notes (Signed)
   Subjective:    Patient ID: Joseph [name], male    DOB: 04/23/1971, 45 y.o.   MRN: [id number]  HPI  45 year old disabled smoker, for FU of OSA He sees MW for chronic cough-and is maintained on dulera for COPD, of note he is 'allergic' to Symbicort.  NPSG in 10/2011 -showed moderate OSA with AHI of 19 per hour and lowest desaturation of 86%. This was corrected by CPAP of 8 cm with a full face mask.    09/03/2016  Chief Complaint  Patient presents with  . Follow-up    doing well  CPAP.  needs new supplies (hose and headgear)    He obtained a new CPAP in 2017 set at 10 cm and this seems to be working very well for him. He has a full face mask, heated some problems with the straps when he had MRSA infection in his neck-this is cleared now. No problems with mask or pressure Download confirms excellent compliance with no residual events or leak on 10 cm   His weight is unchanged His breathing is much improved on Dulera He continues to smoke, he is going through a divorce right now and is not ready to make quit attempt Review of Systems neg for any significant sore throat, dysphagia, itching, sneezing, nasal congestion or excess/ purulent secretions, fever, chills, sweats, unintended wt loss, pleuritic or exertional cp, hempoptysis, orthopnea pnd or change in chronic leg swelling.  Also denies presyncope, palpitations, heartburn, abdominal pain, nausea, vomiting, diarrhea or change in bowel or urinary habits, dysuria,hematuria, rash, arthralgias, visual complaints, headache, numbness weakness or ataxia.     Objective:   Physical Exam  Gen. Pleasant, obese, in no distress ENT - no lesions, no post nasal drip, long red uvula Neck: No JVD, no thyromegaly, no carotid bruits Lungs: no use of accessory muscles, no dullness to percussion, decreased without rales or rhonchi  Cardiovascular: Rhythm regular, heart sounds  normal, no murmurs or gallops, no peripheral edema Musculoskeletal: No  deformities, no cyanosis or clubbing , no tremors       Assessment & Plan:

## 2016-09-03 NOTE — Assessment & Plan Note (Signed)
Smoking cessation was again encouraged. He is going through a divorce right now and was not willing to commit to a quit attempt

## 2016-09-04 NOTE — Telephone Encounter (Signed)
Patient notified

## 2016-09-06 ENCOUNTER — Ambulatory Visit (INDEPENDENT_AMBULATORY_CARE_PROVIDER_SITE_OTHER): Payer: Medicare Other | Admitting: Orthopedic Surgery

## 2016-09-06 ENCOUNTER — Encounter: Payer: Self-pay | Admitting: Orthopedic Surgery

## 2016-09-06 VITALS — BP 131/87 | HR 92 | Wt 264.0 lb

## 2016-09-06 DIAGNOSIS — M48061 Spinal stenosis, lumbar region without neurogenic claudication: Secondary | ICD-10-CM

## 2016-09-06 DIAGNOSIS — M5136 Other intervertebral disc degeneration, lumbar region: Secondary | ICD-10-CM

## 2016-09-06 NOTE — Patient Instructions (Signed)
REFERRAL TO [facility]

## 2016-09-06 NOTE — Progress Notes (Signed)
Patient ID: [name], male   DOB: 03-04-1971, 45 y.o.   MRN: [id number]    Chief Complaint  Patient presents with  . Follow-up    MRI REVIEW LUMBAR    BP 131/87   Pulse 92   Wt 264 lb (119.7 kg)   BMI 37.88 kg/m   MRI follow-up lumbar spine  L4-5: Moderate disc bulge eccentric to the left foraminal and far lateral zone and moderate facet/ligamentum flavum hypertrophy. Mild right and moderate to severe left foraminal narrowing. Mild canal stenosis. Effacement of the lateral recesses bilaterally.   L5-S1: Small disc bulge and bilateral facet and ligamentum flavum hypertrophy. Mild bilateral foraminal narrowing. No significant canal stenosis.   IMPRESSION: Lumbar spondylosis greatest at the L4-5 level with there is multifactorial and mild right and moderate to severe left foraminal narrowing and mild canal stenosis. Minimal degenerative changes at other levels.     Electronically Signed   By: Mitzi HansenLance  Furusawa-Stratton M.D.   On: 09/03/2016 21:14   I reviewed the MRI with the patient I agree with the report is read I recommended he see a neurosurgeon

## 2016-09-11 ENCOUNTER — Encounter: Payer: Self-pay | Admitting: Pulmonary Disease

## 2016-09-11 DIAGNOSIS — Z6839 Body mass index (BMI) 39.0-39.9, adult: Secondary | ICD-10-CM | POA: Diagnosis not present

## 2016-09-11 DIAGNOSIS — R030 Elevated blood-pressure reading, without diagnosis of hypertension: Secondary | ICD-10-CM | POA: Diagnosis not present

## 2016-09-11 DIAGNOSIS — M5126 Other intervertebral disc displacement, lumbar region: Secondary | ICD-10-CM | POA: Diagnosis not present

## 2016-09-18 ENCOUNTER — Encounter: Payer: Self-pay | Admitting: Family Medicine

## 2016-09-18 ENCOUNTER — Telehealth: Payer: Self-pay | Admitting: Family Medicine

## 2016-09-18 ENCOUNTER — Ambulatory Visit (INDEPENDENT_AMBULATORY_CARE_PROVIDER_SITE_OTHER): Payer: Medicare Other | Admitting: Family Medicine

## 2016-09-18 VITALS — BP 132/88 | Temp 98.4°F | Ht 69.5 in | Wt 274.5 lb

## 2016-09-18 DIAGNOSIS — J440 Chronic obstructive pulmonary disease with acute lower respiratory infection: Secondary | ICD-10-CM | POA: Diagnosis not present

## 2016-09-18 DIAGNOSIS — J441 Chronic obstructive pulmonary disease with (acute) exacerbation: Secondary | ICD-10-CM

## 2016-09-18 DIAGNOSIS — J019 Acute sinusitis, unspecified: Secondary | ICD-10-CM

## 2016-09-18 DIAGNOSIS — J209 Acute bronchitis, unspecified: Secondary | ICD-10-CM | POA: Diagnosis not present

## 2016-09-18 MED ORDER — PREDNISONE 20 MG PO TABS: ORAL_TABLET | ORAL | 0 refills | Status: DC

## 2016-09-18 MED ORDER — LEVOFLOXACIN 500 MG PO TABS: 500.0000 mg | ORAL_TABLET | Freq: Every day | ORAL | 0 refills | Status: DC

## 2016-09-18 NOTE — Telephone Encounter (Signed)
Joseph AlkenFYI - [name] with [name] ([facility]) called to inform us that the patient did show up for his appointment, however he informed their office that he is seeing [name] for an injection here soon and therefore he was not seen.  Their office will not see pt as long as he's getting injections for pain management elsewhere  (referral to [name] was done by [name]'s office)

## 2016-09-18 NOTE — Progress Notes (Signed)
   Subjective:    Patient ID: Joseph [name], male    DOB: 09/16/1971, 45 y.o.   MRN: [id number]  Sinusitis  This is a new problem. The current episode started yesterday. The problem is unchanged. There has been no fever. Associated symptoms include coughing. (Wheezing, runny nose) Past treatments include nothing. The treatment provided no relief.   Some headache frontal May in nature. Cough productive. Yellowish phlegm. Low-grade fever. Sudden onset. Substantial flare of wheezing. Patient has unfortunately disabling COPD and unfortunately continues to smoke    Review of Systems  Respiratory: Positive for cough.   No vomiting or diarrhea     Objective:   Physical Exam  Alert moderate malaise vital stable HET moderate his congestion frontal tenderness lungs substantial bilateral wheezes no tachypnea no inspiratory crackles heart regular in rhythm      Assessment & Plan:  Impression sinusitis bronchitis with exacerbation of COPD plan antibiotics prescribed. Steroids prescribed. Albuterol every 4 hours warning signs discussed

## 2016-09-19 ENCOUNTER — Telehealth: Payer: Self-pay | Admitting: Family Medicine

## 2016-09-19 NOTE — Telephone Encounter (Signed)
Pt calling to request refill on pain medicine until he sees [name]   HYDROcodone-acetaminophen (NORCO/VICODIN) 5-325 MG tablet   Has appt 09/26/16    Please advise

## 2016-09-24 NOTE — Telephone Encounter (Signed)
good

## 2016-09-24 NOTE — Telephone Encounter (Signed)
Pt seeing many specialists now, and story changes week to week, call him today, see what his pain med request is as of today, and clarify when the specialist will take over

## 2016-09-24 NOTE — Telephone Encounter (Signed)
SORRY, I just noticed that I did not forward this message last week.

## 2016-09-24 NOTE — Telephone Encounter (Signed)
Patient states he is seeing specialist 09/26/16 and will get pain meds from them

## 2016-09-26 ENCOUNTER — Other Ambulatory Visit: Payer: Self-pay | Admitting: Internal Medicine

## 2016-09-26 ENCOUNTER — Telehealth: Payer: Self-pay | Admitting: Family Medicine

## 2016-09-26 NOTE — Telephone Encounter (Signed)
Patient went to pain management a couple of weeks ago and was told that they could not prescribe him medications because he was getting injections from his other pain management doctor.  He said he went to the pain management who administers his injections and was told that they could not prescribe him pain medication because the other doctor was supposed to be doing this.    Patient said he is in extreme pain because he does have a ruptured disc and very little reflexes in left leg We are prescribing Vicodin 5 mg but its not touching it. He wants to know if there is anything else we can give him to help him.  Also, he knows that Dr. Brett CanalesSteve is out of the office today, but [facility] or Dr[facility] Lorin PicketScott can address.

## 2016-09-26 NOTE — Telephone Encounter (Signed)
Dr Steve to see per Dr Lorin PicketScott Patient is uBrett Canalespset and states he is in severe pain and the pain management doctor , back doctor and ortho say that only Dr Joseph CanalesSteve can prescribe him pain medication and they all say they can't. Pain management and the back specialist say they can do injections but no pain meds and they hydrocodone 5mg  he had from Dr Joseph CanalesSteve in the past doesn't help and he is currently completely out

## 2016-09-27 ENCOUNTER — Telehealth: Payer: Self-pay | Admitting: Orthopedic Surgery

## 2016-09-27 NOTE — Telephone Encounter (Signed)
NO CALL DR Gerda DissLUKING

## 2016-09-27 NOTE — Telephone Encounter (Signed)
ROUTING TO DR HARRISON FOR REVEIW

## 2016-09-27 NOTE — Telephone Encounter (Signed)
Patient called (had left voice message 09/26/16) - returned call; states is scheduled with Dr Murray HodgkinsBartko, [facility] 10/04/16, however, states his pain management specialist to whom Dr Gerda DissLuking referred, will now not see him - states was told cannot see 2 pain management providers.  States back is hurting and is asking Dr Romeo AppleHarrison for pain medication.  I relayed that pain management contract is likely to not permit.  Please advise. Patient's ph# [phone].

## 2016-09-30 ENCOUNTER — Other Ambulatory Visit: Payer: Self-pay | Admitting: Internal Medicine

## 2016-09-30 ENCOUNTER — Ambulatory Visit (INDEPENDENT_AMBULATORY_CARE_PROVIDER_SITE_OTHER): Payer: Medicare Other | Admitting: Family Medicine

## 2016-09-30 ENCOUNTER — Encounter: Payer: Self-pay | Admitting: Family Medicine

## 2016-09-30 VITALS — BP 134/90 | Ht 69.5 in | Wt 269.0 lb

## 2016-09-30 DIAGNOSIS — G894 Chronic pain syndrome: Secondary | ICD-10-CM

## 2016-09-30 DIAGNOSIS — M5432 Sciatica, left side: Secondary | ICD-10-CM | POA: Diagnosis not present

## 2016-09-30 MED ORDER — HYDROCODONE-ACETAMINOPHEN 10-325 MG PO TABS: ORAL_TABLET | ORAL | 0 refills | Status: DC

## 2016-09-30 MED ORDER — MAGIC MOUTHWASH: ORAL | 0 refills | Status: DC

## 2016-09-30 MED ORDER — HYDROCODONE-ACETAMINOPHEN 7.5-325 MG PO TABS: 1.0000 | ORAL_TABLET | Freq: Four times a day (QID) | ORAL | 0 refills | Status: DC | PRN

## 2016-09-30 NOTE — Progress Notes (Signed)
   Subjective:    Patient ID: Joseph [name], male    DOB: 03-Mar-1971, 45 y.o.   MRN: [id number]  HPI This patient was seen today for chronic pain  The medication list was reviewed and updated.   -Compliance with medication: States takes as prescribed   - Number patient states they take daily: Takes 1 every 3.5 hours.  -when was the last dose patient took? Last dose two weeks ago.  The patient was advised the importance of maintaining medication and not using illegal substances with these.  Refills needed: Yes, needs refills.  The patient was educated that we can provide 3 monthly scripts for their medication, it is their responsibility to follow the instructions.  Side effects or complications from medications: None Patient is aware that pain medications are meant to minimize the severity of the pain to allow their pain levels to improve to allow for better function. They are aware of that pain medications cannot totally remove their pain.    Due for UDT ( at least once per year) :  Pain spine specialist only does shots  [facility] only [facility] shots,  NeedsPain medicine prescribed.  Left leg aching and pain into foot with loss of the reflexes, getting    See prior phone messages  Frustrated because patient's pain specialist said they would only give injections and not prescribed medications   Review of Systems No headache, no major weight loss or weight gain, no chest pain no back pain abdominal pain no change in bowel habits complete ROS otherwise negative     Objective:   Physical Exam  Alert moderate malaise positive low back pain to percussion positive left straight leg raise lungs clear heart regular in rhythm.      Assessment & Plan:  Impression chronic pain superimposed by acute pain. Complicated by fact that patient's new pain specialist apparently does not prescribed pain medications. I discussed with patient this is a growing trend. Prescribing of oral  pain medicines is difficult challenging L a tremendous number of new rules and it seems like the pain specialists arer getting out of the business. of doing it, and focusing on purely injections. I will reluctantly maintain this patient's medications. I really think ideally it should be done by the person injecting the painful area and hopefully monitoring the patient's response and whether or not they continue to need pain medications in the first place. 25 minutes spent most in discussion of this challenging situation. Hydrocodone 7.5 #120 one 4 times a day when necessary for pain. 30 days only follow-up as scheduled

## 2016-09-30 NOTE — Telephone Encounter (Signed)
Mandatory face to face visit with me before any further Rx's, can do in a same day slot, even this aft or morn

## 2016-09-30 NOTE — Telephone Encounter (Signed)
Appointment scheduled.

## 2016-09-30 NOTE — Telephone Encounter (Signed)
Called and left message for pt to call back for appt today. Same day slot ok per dr Brett Canalessteve

## 2016-10-04 ENCOUNTER — Telehealth: Payer: Self-pay | Admitting: Family Medicine

## 2016-10-04 ENCOUNTER — Other Ambulatory Visit: Payer: Self-pay | Admitting: *Deleted

## 2016-10-04 DIAGNOSIS — M5126 Other intervertebral disc displacement, lumbar region: Secondary | ICD-10-CM | POA: Diagnosis not present

## 2016-10-04 DIAGNOSIS — M5416 Radiculopathy, lumbar region: Secondary | ICD-10-CM | POA: Diagnosis not present

## 2016-10-04 MED ORDER — FLUCONAZOLE 200 MG PO TABS: 200.0000 mg | ORAL_TABLET | Freq: Every day | ORAL | 0 refills | Status: DC

## 2016-10-04 NOTE — Telephone Encounter (Signed)
Using duke's magic mouthwash but pt also wants to add oral tablet. He states his tongue is on fire

## 2016-10-04 NOTE — Progress Notes (Unsigned)
Dif 200

## 2016-10-04 NOTE — Telephone Encounter (Signed)
Med sent to pharm. Pt notified.  

## 2016-10-04 NOTE — Telephone Encounter (Signed)
Pt was told that he had a touch of thrush by Dr. Brett CanalesSteve. Pt just had injections in his back and was told by that Dr. Claiborne Billingshat he should call us to see about getting an antifungal med called in. Please advise.    [place]

## 2016-10-04 NOTE — Telephone Encounter (Signed)
Diflucan 200 mg tablet, 1 daily for the next 5 days, #5

## 2016-10-08 ENCOUNTER — Telehealth: Payer: Self-pay | Admitting: Family Medicine

## 2016-10-08 NOTE — Telephone Encounter (Signed)
Sorry no longer prescribing testosterone for men unless severely low numbers from testicle surgery, local radiation etc. i've spoke with pt at length about this

## 2016-10-08 NOTE — Telephone Encounter (Signed)
Requesting to be put back on his Androgel.  He said he has been off of it for about 6 months now and he has noticed a difference in his energy levels.   [facility]

## 2016-10-08 NOTE — Telephone Encounter (Signed)
Patient advised  That Dr Brett CanalesSteve states that he is sorry but  no longer prescribing testosterone for men unless severely low numbers from testicle surgery, local radiation etc. Patient verbalized understanding.

## 2016-10-16 DIAGNOSIS — M5416 Radiculopathy, lumbar region: Secondary | ICD-10-CM | POA: Diagnosis not present

## 2016-10-16 DIAGNOSIS — M5126 Other intervertebral disc displacement, lumbar region: Secondary | ICD-10-CM | POA: Diagnosis not present

## 2016-10-24 ENCOUNTER — Other Ambulatory Visit: Payer: Self-pay | Admitting: Family Medicine

## 2016-10-25 ENCOUNTER — Other Ambulatory Visit: Payer: Self-pay | Admitting: Family Medicine

## 2016-10-28 ENCOUNTER — Telehealth: Payer: Self-pay | Admitting: Family Medicine

## 2016-10-28 NOTE — Telephone Encounter (Signed)
No, not accurate, see assessment and plan my last note I said return in thirty d to assess response to pain specialist interventions in order to decide approp pain medf dose going forward, needs o v before we will rx further, can see tomorrow

## 2016-10-28 NOTE — Telephone Encounter (Signed)
Spoke with patient and informed him per Dr.Scott Luking-In last note Dr.Steve said  return in thirty days  to assess response to pain specialist interventions in order to decide appropriate  pain medication dose going forward, needs office  visit before we will prescription further, can see tomorrow. Patient verbalized understanding.

## 2016-10-28 NOTE — Telephone Encounter (Signed)
Patient says at his last visit that Dr. Brett CanalesSteve was going to call his pain management and ask them to go ahead and take over handling all of his pain management.  He said that no one has called him yet so he was unsure if we contacted the office yet.  He is almost out of his pain medication.  Please advise.

## 2016-10-29 ENCOUNTER — Ambulatory Visit (INDEPENDENT_AMBULATORY_CARE_PROVIDER_SITE_OTHER): Payer: Medicare Other | Admitting: Family Medicine

## 2016-10-29 ENCOUNTER — Encounter: Payer: Self-pay | Admitting: Family Medicine

## 2016-10-29 VITALS — BP 122/84 | Ht 69.5 in | Wt 265.0 lb

## 2016-10-29 DIAGNOSIS — Z79891 Long term (current) use of opiate analgesic: Secondary | ICD-10-CM

## 2016-10-29 DIAGNOSIS — G894 Chronic pain syndrome: Secondary | ICD-10-CM

## 2016-10-29 MED ORDER — OXYCODONE-ACETAMINOPHEN 7.5-325 MG PO TABS: ORAL_TABLET | ORAL | 0 refills | Status: DC

## 2016-10-29 MED ORDER — OXYCODONE-ACETAMINOPHEN 7.5-325 MG PO TABS
ORAL_TABLET | ORAL | 0 refills | Status: DC
Start: 2016-10-29 — End: 2017-01-30

## 2016-10-29 NOTE — Progress Notes (Signed)
   Subjective:    Patient ID: Joseph [name], male    DOB: 05/02/1971, 45 y.o.   MRN: [id number]  HPI This patient was seen today for chronic pain. Takes for low back pain.   The medication list was reviewed and updated.   -Compliance with medication: yes  - Number patient states they take daily: four a day  -when was the last dose patient took? yesterday  The patient was advised the importance of maintaining medication and not using illegal substances with these.  Refills needed: none  The patient was educated that we can provide 3 monthly scripts for their medication, it is their responsibility to follow the instructions.  Side effects or complications from medications: none  Patient is aware that pain medications are meant to minimize the severity of the pain to allow their pain levels to improve to allow for better function. They are aware of that pain medications cannot totally remove their pain.  Due for UDT ( at least once per year) : due today   First sht did not help  Second one helped the pain into the right leg  And now has sig low back pain        Review of Systems No headache, no major weight loss or weight gain, no chest pain no newback pain abdominal pain no change in bowel habits complete ROS otherwise negative     Objective:   Physical Exam  Alert moderate distress low back pain to percussion plus minus straight leg raise on the left lungs clear heart regular in rhythm      Assessment & Plan:  Impression sciatica #2 degenerative disc disease #3 chronic pain associated with this patient is been on pain medicine for 15 years states he actually needs it to do his daily activities. Drug screen reviewed plan oxycodone 7.5 4 times a day. 3 months worth written follow-up 10 urine drug screen patient pain contract renewed

## 2016-11-03 LAB — TOXASSURE SELECT 13 (MW), URINE

## 2016-11-22 DIAGNOSIS — M5416 Radiculopathy, lumbar region: Secondary | ICD-10-CM | POA: Diagnosis not present

## 2016-11-23 ENCOUNTER — Other Ambulatory Visit: Payer: Self-pay | Admitting: Internal Medicine

## 2016-11-23 ENCOUNTER — Other Ambulatory Visit: Payer: Self-pay | Admitting: Family Medicine

## 2016-12-02 HISTORY — PX: EYE SURGERY: SHX253

## 2016-12-11 ENCOUNTER — Emergency Department (HOSPITAL_COMMUNITY)
Admission: EM | Admit: 2016-12-11 | Discharge: 2016-12-11 | Disposition: A | Discharge status: Home / Self Care | Payer: Medicare Other | Attending: Emergency Medicine | Admitting: Emergency Medicine

## 2016-12-11 ENCOUNTER — Encounter (HOSPITAL_COMMUNITY): Payer: Self-pay | Admitting: Emergency Medicine

## 2016-12-11 ENCOUNTER — Emergency Department (HOSPITAL_COMMUNITY): Payer: Medicare Other

## 2016-12-11 DIAGNOSIS — F17210 Nicotine dependence, cigarettes, uncomplicated: Secondary | ICD-10-CM | POA: Diagnosis not present

## 2016-12-11 DIAGNOSIS — J449 Chronic obstructive pulmonary disease, unspecified: Secondary | ICD-10-CM | POA: Insufficient documentation

## 2016-12-11 DIAGNOSIS — Z7982 Long term (current) use of aspirin: Secondary | ICD-10-CM | POA: Diagnosis not present

## 2016-12-11 DIAGNOSIS — W11XXXA Fall on and from ladder, initial encounter: Secondary | ICD-10-CM | POA: Diagnosis not present

## 2016-12-11 DIAGNOSIS — Y939 Activity, unspecified: Secondary | ICD-10-CM | POA: Insufficient documentation

## 2016-12-11 DIAGNOSIS — Y999 Unspecified external cause status: Secondary | ICD-10-CM | POA: Diagnosis not present

## 2016-12-11 DIAGNOSIS — S52592A Other fractures of lower end of left radius, initial encounter for closed fracture: Secondary | ICD-10-CM | POA: Insufficient documentation

## 2016-12-11 DIAGNOSIS — Z79899 Other long term (current) drug therapy: Secondary | ICD-10-CM | POA: Diagnosis not present

## 2016-12-11 DIAGNOSIS — Z7984 Long term (current) use of oral hypoglycemic drugs: Secondary | ICD-10-CM | POA: Diagnosis not present

## 2016-12-11 DIAGNOSIS — Y929 Unspecified place or not applicable: Secondary | ICD-10-CM | POA: Diagnosis not present

## 2016-12-11 DIAGNOSIS — E119 Type 2 diabetes mellitus without complications: Secondary | ICD-10-CM | POA: Insufficient documentation

## 2016-12-11 DIAGNOSIS — S6992XA Unspecified injury of left wrist, hand and finger(s), initial encounter: Secondary | ICD-10-CM | POA: Diagnosis present

## 2016-12-11 DIAGNOSIS — J45909 Unspecified asthma, uncomplicated: Secondary | ICD-10-CM | POA: Diagnosis not present

## 2016-12-11 MED ORDER — NAPROXEN 500 MG PO TABS: 500.0000 mg | ORAL_TABLET | Freq: Two times a day (BID) | ORAL | 1 refills | Status: DC

## 2016-12-11 MED ORDER — OXYCODONE-ACETAMINOPHEN 5-325 MG PO TABS
2.0000 | ORAL_TABLET | Freq: Once | ORAL | Status: AC
  Administered 2016-12-11: 2 via ORAL
  Filled 2016-12-11: qty 2

## 2016-12-11 NOTE — ED Provider Notes (Signed)
[facility] Provider Note   CSN: [id number] Arrival date & time: 12/11/16  1412     History   Chief Complaint Chief Complaint  Patient presents with  . Wrist Pain    left    HPI Joseph [name] is a 46 y.o. male.  Joseph [name] is a 46 y.o. Male who presents to the ED after falling off a ladder prior to arrival today. Patient reports he was on a low rung of 6 foot ladder when he slipped and fell on his outstretched left wrist. He is right hand dominant. He denies other injury. He reports sudden pain to his left wrist. He reports gradual onset of some numbness in the top of his hand. He denies numbness or tingling in his fingers. He denies pain to his left elbow or shoulder. He denies pain to his neck or back. He denies hitting his head or loss of consciousness.       Past Medical History:  Diagnosis Date  . Anxiety   . Arthritis   . Asthma   . Chronic back pain   . Chronic pain of left knee   . COPD (chronic obstructive pulmonary disease) (HCC)   . Diabetes mellitus   . GERD (gastroesophageal reflux disease)   . Shortness of breath   . Sleep apnea     Patient Active Problem List   Diagnosis Date Noted  . Bucket handle tear of meniscus of right knee   . Depression 10/07/2015  . Morbid obesity (HCC) 10/06/2015  . OSA (obstructive sleep apnea) 08/31/2014  . Low serum testosterone level 07/01/2014  . S/P knee surgery 04/26/2014  . Acute meniscal tear of left knee 04/26/2014  . Medial meniscus, posterior horn derangement 04/22/2014  . Intrinsic asthma 12/19/2013  . Chronic pain syndrome 05/13/2013  . Diabetes (HCC) 05/13/2013  . Dyspnea 08/14/2012  . Mediastinal abnormality 04/10/2012  . Cigarette smoker 04/10/2012  . ABDOMINAL PAIN, GENERALIZED, CHRONIC 10/03/2009  . INGUINAL PAIN, LEFT 10/03/2009    Past Surgical History:  Procedure Laterality Date  . CARPAL TUNNEL RELEASE Left   . CATARACT EXTRACTION W/PHACO Right 03/11/2016   Procedure: CATARACT  EXTRACTION PHACO AND INTRAOCULAR LENS PLACEMENT (IOC);  Surgeon: Gemma Payor, MD;  Location: [facility] OR[facility];  Service: Ophthalmology;  Laterality: Right;  CDE 4.24  . HERNIA REPAIR Bilateral   . KNEE ARTHROSCOPY WITH MEDIAL MENISECTOMY Left 04/22/2014   Procedure: LEFT KNEE ARTHROSCOPY WITH MEDIAL MENISECTOMY;  Surgeon: Vickki Hearing, MD;  Location: [facility];  Service: Orthopedics;  Laterality: Left;  . KNEE ARTHROSCOPY WITH MEDIAL MENISECTOMY Right 12/06/2015   Procedure: RIGHT KNEE ARTHROSCOPY WITH MEDIAL MENISECTOMY;  Surgeon: Vickki Hearing, MD;  Location: [facility];  Service: Orthopedics;  Laterality: Right;  . SHOULDER ARTHROCENTESIS Right   . VASECTOMY         Home Medications    Prior to Admission medications   Medication Sig Start Date End Date Taking? Authorizing Provider  albuterol (PROAIR HFA) 108 (90 BASE) MCG/ACT inhaler 2 puffs every 4 hours if needed 03/08/15   Nyoka Cowden, MD  albuterol (PROVENTIL) (2.5 MG/3ML) 0.083% nebulizer solution USE 1 VIAL IN NEBULIZER FOUR TIMES DAILY 04/16/16   Merlyn Albert, MD  ALPRAZolam [name]) 1 MG tablet TAKE ONE TABLET TWICE DAILY AS NEEDED FOR ANXIETY 08/30/16   Merlyn Albert, MD  ANDROGEL PUMP 20.25 MG/ACT (1.62%) GEL APPLY TWO PUMPS ONCE DAILY 11/16/15   Merlyn Albert, MD  aspirin 325 MG EC tablet  Take 325 mg by mouth 2 (two) times daily.     Historical Provider, MD  buPROPion (WELLBUTRIN SR) 150 MG 12 hr tablet TAKE ONE TABLET DAILY FOR THREE DAYS, THEN 1 TABLET TWICE DAILY. Patient taking differently: Take 150 mg by mouth 2 (two) times daily.  08/01/16   Merlyn Albert, MD  citalopram (CELEXA) 40 MG tablet Take 1 tablet (40 mg total) by mouth daily. 08/01/16   Merlyn Albert, MD  DULERA 200-5 MCG/ACT AERO USE TWO PUFFS FIRST THING IN THE [facility] THEN ANOTHER TWO PUFFS ABOUT 12 HOURS LATER 11/26/16   Oretha Milch, MD  glipiZIDE (GLUCOTROL) 5 MG tablet Take 2 tabs bid Patient taking differently: Take 5 mg by mouth 2 (two) times  daily before a meal. Take 2 tabs bid 08/01/16   Merlyn Albert, MD  glucosamine-chondroitin 500-400 MG tablet Take 1 tablet by mouth 3 (three) times daily.    Historical Provider, MD  ipratropium (ATROVENT) 0.02 % nebulizer solution USE 1 VIAL IN NEBULIZER FOUR TIMES DAILY 10/25/16   Merlyn Albert, MD  ipratropium (ATROVENT) 0.02 % nebulizer solution USE 1 VIAL IN NEBULIZER FOUR TIMES DAILY 10/25/16   Merlyn Albert, MD  lansoprazole (PREVACID) 30 MG capsule TAKE ONE CAPSULE BY MOUTH TWICE A DAY BEFORE A MEAL 08/01/16   Merlyn Albert, MD  Multiple Vitamin (MULTIVITAMIN WITH MINERALS) TABS Take 1 tablet by mouth every morning. Men's once daily multivitamin packet (vitamin e, calcium, ginseng)    Historical Provider, MD  nabumetone (RELAFEN) 500 MG tablet Take 1 tablet (500 mg total) by mouth 2 (two) times daily. 08/27/16   Vickki Hearing, MD  naproxen (NAPROSYN) 500 MG tablet Take 1 tablet (500 mg total) by mouth 2 (two) times daily with a meal. 12/11/16   Everlene Farrier, PA-C  oxyCODONE-acetaminophen (PERCOCET) 7.5-325 MG tablet Take one tablet qid prn pain 10/29/16   Merlyn Albert, MD  Probiotic Product (PROBIOTIC PO) Take 1 tablet by mouth daily.    Historical Provider, MD  Saw Palmetto, Serenoa repens, (SAW PALMETTO PO) Take 1 capsule by mouth daily.    Historical Provider, MD  sildenafil (REVATIO) 20 MG tablet 2-3 tablets po prn 08/22/15   Merlyn Albert, MD  sildenafil (REVATIO) 20 MG tablet TAKE 2-3 TABLETS AS NEEDED 11/26/16   Merlyn Albert, MD    Family History Family History  Problem Relation Age of Onset  . Emphysema Maternal Grandfather   . Emphysema Maternal Grandmother   . Clotting disorder Maternal Grandmother     Social History Social History  Substance Use Topics  . Smoking status: Current Every Day Smoker    Packs/day: 1.50    Years: 20.00    Types: Cigarettes    Start date: 12/17/1993  . Smokeless tobacco: Current User  . Alcohol use No      Allergies   Azithromycin; Erythromycin; Keflex [cephalexin]; Metformin; and Symbicort [budesonide-formoterol fumarate]   Review of Systems Review of Systems  Constitutional: Negative for fever.  Musculoskeletal: Positive for arthralgias and joint swelling. Negative for back pain and neck pain.  Skin: Negative for rash and wound.  Neurological: Positive for numbness. Negative for dizziness, weakness, light-headedness and headaches.     Physical Exam Updated Vital Signs BP (!) 133/104 (BP Location: Right Arm)   Pulse 93   Temp 98.1 F (36.7 C) (Oral)   Resp 18   Ht 5\' 10"  (1.778 m)   Wt 122.5 kg   SpO2 97%  BMI 38.74 kg/m   Physical Exam  Constitutional: He appears well-developed and well-nourished. No distress.  Nontoxic appearing.  HENT:  Head: Normocephalic and atraumatic.  Right Ear: External ear normal.  Left Ear: External ear normal.  Eyes: Conjunctivae are normal. Pupils are equal, round, and reactive to light. Right eye exhibits no discharge. Left eye exhibits no discharge.  Cardiovascular: Normal rate, regular rhythm and intact distal pulses.   Bilateral radial pulses are intact. Good capillary refill to his left distal fingertips.  Pulmonary/Chest: Effort normal. No respiratory distress.  Musculoskeletal: He exhibits edema, tenderness and deformity.  Tenderness, edema and mild deformity noted to his left medial wrist. No open wounds. No broken skin. Good capillary refill to his left distal fingertips. Good strength with flexion and extension at his fingers. No tenderness noted to his left elbow or shoulder. Hand compartments feel soft. Patient reports some decreased sensation over the dorsal aspect of his left hand near his thumb. This is a small area approximately 1 cm in size.  Neurological: He is alert. Coordination normal.  Sensation is intact to his bilateral distal fingertips. Patient does report some decreased sensation over the dorsal aspect of his  left hand near his thumb. Otherwise, neurovascularly intact.  Skin: Skin is warm and dry. Capillary refill takes less than 2 seconds. No rash noted. He is not diaphoretic. No erythema. No pallor.  Psychiatric: He has a normal mood and affect. His behavior is normal.  Nursing note and vitals reviewed.    ED Treatments / Results  Labs (all labs ordered are listed, but only abnormal results are displayed) Labs Reviewed  CBG MONITORING, ED    EKG  EKG Interpretation None       Radiology Dg Wrist Complete Left  Result Date: 12/11/2016 CLINICAL DATA:  Status post fall when ladder collapsed EXAM: LEFT WRIST - COMPLETE 3+ VIEW COMPARISON:  None. FINDINGS: Comminuted fracture of the distal radial metaphysis extending to the epiphysis with mild impaction and apex volar angulation. No other fracture or dislocation. Mild osteoarthritis of the first CMC joint. Soft tissue swelling around the wrist. IMPRESSION: 1. Comminuted fracture of the distal radial metaphysis extending to the epiphysis with mild impaction and apex volar angulation. Electronically Signed   By: Elige Ko   On: 12/11/2016 15:10    Procedures Procedures (including critical care time)  Medications Ordered in ED Medications  oxyCODONE-acetaminophen (PERCOCET/ROXICET) 5-325 MG per tablet 2 tablet (2 tablets Oral Given 12/11/16 1633)     Initial Impression / Assessment and Plan / ED Course  I have reviewed the triage vital signs and the nursing notes.  Pertinent labs & imaging results that were available during my care of the patient were reviewed by me and considered in my medical decision making (see chart for details).  Clinical Course    This is a 46 y.o. Male who presents to the ED after falling off a ladder prior to arrival today. Patient reports he was on a low rung of 6 foot ladder when he slipped and fell on his outstretched left wrist. He is right hand dominant. He denies other injury. He reports sudden pain to  his left wrist. He reports gradual onset of some numbness in the top of his hand. He denies numbness or tingling in his fingers. On exam the patient is afebrile nontoxic appearing. He has some tenderness, edema and mild deformity noted to the medial aspect of his left wrist. No open wounds. Bilateral radial pulses are intact. He  has good capillary refill to his bilateral distal fingertips. He does report some decreased sensation over the dorsal aspect of his left hand near his thumb. Left wrist x-ray shows a comminuted fracture of the distal radial metaphysis extending to the epiphysis with mild impaction and apex volar angulation.  I consulted with orthopedic surgeon Joseph [name] to devices he will see the patient in follow-up in clinic tomorrow. He reports it is not necessary to do a reduction in the ER, but we can attempt if comfortable.   I offered to do a hematoma block for the patient and reduce his fracture. Patient declines and states " I would rather just have Dr. Romeo AppleHarrison do it." Patient placed in a cervical tongs splint. He has Percocet at home to take for pain. We'll also provide him with naproxen. Discussed using ice. Discussed when care precautions. I advised him to call [name]'s office tomorrow morning to make an appointment. Discussed return precautions. I advised the patient to follow-up with their primary care provider this week. I advised the patient to return to the emergency department with new or worsening symptoms or new concerns. The patient verbalized understanding and agreement with plan.    This patient was discussed with Dr. Rubin PayorPickering who agrees with assessment and plan.   Final Clinical Impressions(s) / ED Diagnoses   Final diagnoses:  Other closed fracture of distal end of left radius, initial encounter    New Prescriptions New Prescriptions   NAPROXEN (NAPROSYN) 500 MG TABLET    Take 1 tablet (500 mg total) by mouth 2 (two) times daily with a meal.      Everlene FarrierWilliam Scotti Motter, PA-C 12/11/16 1805    Benjiman CoreNathan Pickering, MD 12/11/16 [phone]

## 2016-12-11 NOTE — ED Notes (Signed)
Pt took 2 7.5mg  of percocet prior to coming to ED.

## 2016-12-11 NOTE — ED Triage Notes (Signed)
Pt fell off ladder, injury to left wrist, rating pain 10/10.

## 2016-12-11 NOTE — Discharge Instructions (Signed)
Please call tomorrow and make an appointment for follow up with Dr. Romeo AppleHarrison.

## 2016-12-13 ENCOUNTER — Ambulatory Visit (INDEPENDENT_AMBULATORY_CARE_PROVIDER_SITE_OTHER): Payer: Medicare Other | Admitting: Orthopedic Surgery

## 2016-12-13 ENCOUNTER — Encounter: Payer: Self-pay | Admitting: Orthopedic Surgery

## 2016-12-13 VITALS — BP 135/90 | HR 103 | Wt 265.0 lb

## 2016-12-13 DIAGNOSIS — S52592A Other fractures of lower end of left radius, initial encounter for closed fracture: Secondary | ICD-10-CM

## 2016-12-13 MED ORDER — OXYCODONE-ACETAMINOPHEN 10-325 MG PO TABS: 1.0000 | ORAL_TABLET | ORAL | 0 refills | Status: DC | PRN

## 2016-12-13 NOTE — Progress Notes (Signed)
Patient ID: Joseph [name], male   DOB: [birth year], 46 y.o.   MRN: [id number]  Chief Complaint  Patient presents with  . Wrist Injury    LEFT WRIST FRACTURE, DOI 12/11/16    HPI Joseph ForthJerry R Kook is a 46 y.o. male.   46 year old male was working on a ladder fell off the ladder when the bottom gave out and injured his left wrist. He presents for evaluation and treatment of left wrist  Location of pain in his left wrist quality is sharp at times primarily dull and throbbing occasionally stabbing severity 10 out of 10 and unrelieved by Percocet 7.5 mg although this is a medication that he chronically takes. Duration 2 days  [place] controlled substance reporting system reviewed  Review of Systems Review of Systems  Respiratory: Negative for shortness of breath.   Cardiovascular: Negative for chest pain.  Musculoskeletal: Positive for back pain.     Past Medical History:  Diagnosis Date  . Anxiety   . Arthritis   . Asthma   . Chronic back pain   . Chronic pain of left knee   . COPD (chronic obstructive pulmonary disease) (HCC)   . Diabetes mellitus   . GERD (gastroesophageal reflux disease)   . Shortness of breath   . Sleep apnea     Past Surgical History:  Procedure Laterality Date  . CARPAL TUNNEL RELEASE Left   . CATARACT EXTRACTION W/PHACO Right 03/11/2016   Procedure: CATARACT EXTRACTION PHACO AND INTRAOCULAR LENS PLACEMENT (IOC);  Surgeon: Gemma PayorKerry Hunt, MD;  Location: [facility];  Service: Ophthalmology;  Laterality: Right;  CDE 4.24  . HERNIA REPAIR Bilateral   . KNEE ARTHROSCOPY WITH MEDIAL MENISECTOMY Left 04/22/2014   Procedure: LEFT KNEE ARTHROSCOPY WITH MEDIAL MENISECTOMY;  Surgeon: Vickki HearingStanley E Lara Palinkas, MD;  Location: [facility];  Service: Orthopedics;  Laterality: Left;  . KNEE ARTHROSCOPY WITH MEDIAL MENISECTOMY Right 12/06/2015   Procedure: RIGHT KNEE ARTHROSCOPY WITH MEDIAL MENISECTOMY;  Surgeon: Vickki HearingStanley E Qaadir Kent, MD;  Location: [facility];  Service: Orthopedics;  Laterality:  Right;  . SHOULDER ARTHROCENTESIS Right   . VASECTOMY        Physical Exam 1 Blood pressure 135/90, pulse (!) 103, weight 265 lb (120.2 kg). Physical Exam 2 The patient is well developed well nourished and well groomed. 3 Orientation to person place and time is normal  4 Mood is pleasant.  5 Ambulatory status Normal  6 Inspection of the left wrist very significant amount of tenderness swelling minimal deformity with decreased range of motion secondary to pain  Stability tests deferred because of pain but x-rays subluxation of the joint  Muscle tone is normal resistance testing was deferred  He reports decreased sensation globally in the thumb index long ring and small finger  With normal distal radial pulse and normal capillary refill  Local lymphatic system is normal  Opposite extremity left there is no alignment abnormality, no contracture, no subluxation, no atrophy and neurovascular exam is intact  Data Reviewed X-rays I've independently interpreted the x-ray as follows  3 views of the left wrist: Intra-articular distal radius fracture with excessive dorsal tilt  Assessment    Left distal radius Wrist fracture      Plan  Referral to a hand specialist based on intra-articular nature and displacement  I tried to write him a prescription but because of his ongoing Percocet prescription they may not feel that he is aware  [place] controlled substance reporting system reviewed

## 2016-12-16 ENCOUNTER — Telehealth: Payer: Self-pay | Admitting: Orthopedic Surgery

## 2016-12-16 NOTE — Telephone Encounter (Signed)
IF DR Roda ShuttersXU SAYS ITS OK

## 2016-12-16 NOTE — Telephone Encounter (Signed)
I WAS UNDER THE IMPRESSION HIS APPT WAS TODAY AS WELL BUT IT SHOWS AS NEXT WEEK IN SYSTEM, IS THE 22ND SOON ENOUGH?

## 2016-12-16 NOTE — Telephone Encounter (Signed)
Patient's girlfriend called asking to speak with you or Dr. Romeo AppleHarrison about patient's appointment with Dr. Roda ShuttersXu. She states that the patient went to see Dr. Roda ShuttersXu today under the impression that his appointment was this afternoon, but found out its next Monday. She states he is in a lot of pain and she may have his daughter take him on to the ER. She ask if you or the doctor would please give her a call. She stated that she thought 3 weeks was way too long to wait about his fractured wrist.   Please call and advise [phone].

## 2016-12-17 NOTE — Telephone Encounter (Signed)
[place] [facility] [place]

## 2016-12-17 NOTE — Telephone Encounter (Signed)
Yes or try [facility] [employer]

## 2016-12-17 NOTE — Telephone Encounter (Signed)
Dr Roda ShuttersXu out of office until Thursday and no appts available this week with anyone accept maybe a PA on Friday. Is it ok for the patient to wait until Monday to be seen with this injury? DOI 12/11/16

## 2016-12-18 DIAGNOSIS — S52572A Other intraarticular fracture of lower end of left radius, initial encounter for closed fracture: Secondary | ICD-10-CM | POA: Diagnosis not present

## 2016-12-19 ENCOUNTER — Encounter (HOSPITAL_BASED_OUTPATIENT_CLINIC_OR_DEPARTMENT_OTHER): Payer: Self-pay | Admitting: *Deleted

## 2016-12-19 ENCOUNTER — Ambulatory Visit (HOSPITAL_BASED_OUTPATIENT_CLINIC_OR_DEPARTMENT_OTHER): Payer: Medicare Other | Admitting: Anesthesiology

## 2016-12-19 ENCOUNTER — Ambulatory Visit (HOSPITAL_BASED_OUTPATIENT_CLINIC_OR_DEPARTMENT_OTHER)
Admission: RE | Admit: 2016-12-19 | Discharge: 2016-12-19 | Disposition: A | Discharge status: Home / Self Care | Payer: Medicare Other | Source: Ambulatory Visit | Attending: Orthopedic Surgery | Admitting: Orthopedic Surgery

## 2016-12-19 ENCOUNTER — Encounter (HOSPITAL_BASED_OUTPATIENT_CLINIC_OR_DEPARTMENT_OTHER)
Admission: RE | Disposition: A | Discharge status: Home / Self Care | Payer: Self-pay | Source: Ambulatory Visit | Attending: Orthopedic Surgery

## 2016-12-19 DIAGNOSIS — G8929 Other chronic pain: Secondary | ICD-10-CM | POA: Insufficient documentation

## 2016-12-19 DIAGNOSIS — M549 Dorsalgia, unspecified: Secondary | ICD-10-CM | POA: Diagnosis not present

## 2016-12-19 DIAGNOSIS — Z7984 Long term (current) use of oral hypoglycemic drugs: Secondary | ICD-10-CM | POA: Diagnosis not present

## 2016-12-19 DIAGNOSIS — M1990 Unspecified osteoarthritis, unspecified site: Secondary | ICD-10-CM | POA: Diagnosis not present

## 2016-12-19 DIAGNOSIS — G4730 Sleep apnea, unspecified: Secondary | ICD-10-CM | POA: Diagnosis not present

## 2016-12-19 DIAGNOSIS — R0602 Shortness of breath: Secondary | ICD-10-CM | POA: Insufficient documentation

## 2016-12-19 DIAGNOSIS — Z79899 Other long term (current) drug therapy: Secondary | ICD-10-CM | POA: Diagnosis not present

## 2016-12-19 DIAGNOSIS — K219 Gastro-esophageal reflux disease without esophagitis: Secondary | ICD-10-CM | POA: Insufficient documentation

## 2016-12-19 DIAGNOSIS — F17210 Nicotine dependence, cigarettes, uncomplicated: Secondary | ICD-10-CM | POA: Diagnosis not present

## 2016-12-19 DIAGNOSIS — S52571A Other intraarticular fracture of lower end of right radius, initial encounter for closed fracture: Secondary | ICD-10-CM | POA: Diagnosis present

## 2016-12-19 DIAGNOSIS — G8918 Other acute postprocedural pain: Secondary | ICD-10-CM | POA: Diagnosis not present

## 2016-12-19 DIAGNOSIS — Z9842 Cataract extraction status, left eye: Secondary | ICD-10-CM | POA: Diagnosis not present

## 2016-12-19 DIAGNOSIS — Z7982 Long term (current) use of aspirin: Secondary | ICD-10-CM | POA: Diagnosis not present

## 2016-12-19 DIAGNOSIS — E119 Type 2 diabetes mellitus without complications: Secondary | ICD-10-CM | POA: Insufficient documentation

## 2016-12-19 DIAGNOSIS — S52572A Other intraarticular fracture of lower end of left radius, initial encounter for closed fracture: Secondary | ICD-10-CM | POA: Insufficient documentation

## 2016-12-19 DIAGNOSIS — W11XXXA Fall on and from ladder, initial encounter: Secondary | ICD-10-CM | POA: Insufficient documentation

## 2016-12-19 DIAGNOSIS — Z825 Family history of asthma and other chronic lower respiratory diseases: Secondary | ICD-10-CM | POA: Insufficient documentation

## 2016-12-19 DIAGNOSIS — S62102A Fracture of unspecified carpal bone, left wrist, initial encounter for closed fracture: Secondary | ICD-10-CM | POA: Diagnosis not present

## 2016-12-19 DIAGNOSIS — Z832 Family history of diseases of the blood and blood-forming organs and certain disorders involving the immune mechanism: Secondary | ICD-10-CM | POA: Diagnosis not present

## 2016-12-19 DIAGNOSIS — F419 Anxiety disorder, unspecified: Secondary | ICD-10-CM | POA: Insufficient documentation

## 2016-12-19 DIAGNOSIS — J449 Chronic obstructive pulmonary disease, unspecified: Secondary | ICD-10-CM | POA: Diagnosis not present

## 2016-12-19 DIAGNOSIS — M25562 Pain in left knee: Secondary | ICD-10-CM | POA: Insufficient documentation

## 2016-12-19 DIAGNOSIS — J45909 Unspecified asthma, uncomplicated: Secondary | ICD-10-CM | POA: Diagnosis not present

## 2016-12-19 HISTORY — PX: ORIF WRIST FRACTURE: SHX2133

## 2016-12-19 LAB — POCT I-STAT, CHEM 8
BUN: 8 mg/dL (ref 6–20)
Calcium, Ion: 1.09 mmol/L — ABNORMAL LOW (ref 1.15–1.40)
Chloride: 98 mmol/L — ABNORMAL LOW (ref 101–111)
Creatinine, Ser: 0.7 mg/dL (ref 0.61–1.24)
Glucose, Bld: 345 mg/dL — ABNORMAL HIGH (ref 65–99)
HCT: 40 % (ref 39.0–52.0)
Hemoglobin: 13.6 g/dL (ref 13.0–17.0)
Potassium: 4.3 mmol/L (ref 3.5–5.1)
Sodium: 136 mmol/L (ref 135–145)
TCO2: 25 mmol/L (ref 0–100)

## 2016-12-19 LAB — GLUCOSE, CAPILLARY
GLUCOSE-CAPILLARY: 305 mg/dL — AB (ref 65–99)
Glucose-Capillary: 297 mg/dL — ABNORMAL HIGH (ref 65–99)

## 2016-12-19 SURGERY — OPEN REDUCTION INTERNAL FIXATION (ORIF) WRIST FRACTURE
Anesthesia: General | Site: Wrist | Laterality: Left

## 2016-12-19 MED ORDER — VANCOMYCIN HCL 10 G IV SOLR
1500.0000 mg | INTRAVENOUS | Status: AC
  Administered 2016-12-19: 1500 mg via INTRAVENOUS

## 2016-12-19 MED ORDER — OXYCODONE-ACETAMINOPHEN 5-325 MG PO TABS: ORAL_TABLET | ORAL | 0 refills | Status: DC

## 2016-12-19 MED ORDER — LIDOCAINE 2% (20 MG/ML) 5 ML SYRINGE
INTRAMUSCULAR | Status: AC
  Filled 2016-12-19: qty 5

## 2016-12-19 MED ORDER — LIDOCAINE HCL (CARDIAC) 20 MG/ML IV SOLN
INTRAVENOUS | Status: DC | PRN
  Administered 2016-12-19: 60 mg via INTRAVENOUS

## 2016-12-19 MED ORDER — SCOPOLAMINE 1 MG/3DAYS TD PT72: 1.0000 | MEDICATED_PATCH | Freq: Once | TRANSDERMAL | Status: DC | PRN

## 2016-12-19 MED ORDER — DEXAMETHASONE SODIUM PHOSPHATE 10 MG/ML IJ SOLN
INTRAMUSCULAR | Status: DC | PRN
  Administered 2016-12-19: 4 mg via INTRAVENOUS

## 2016-12-19 MED ORDER — MIDAZOLAM HCL 2 MG/2ML IJ SOLN
1.0000 mg | INTRAMUSCULAR | Status: DC | PRN
  Administered 2016-12-19 (×2): 1 mg via INTRAVENOUS

## 2016-12-19 MED ORDER — FENTANYL CITRATE (PF) 100 MCG/2ML IJ SOLN: 25.0000 ug | INTRAMUSCULAR | Status: DC | PRN

## 2016-12-19 MED ORDER — ONDANSETRON HCL 4 MG/2ML IJ SOLN
INTRAMUSCULAR | Status: DC | PRN
  Administered 2016-12-19: 4 mg via INTRAVENOUS

## 2016-12-19 MED ORDER — VANCOMYCIN HCL IN DEXTROSE 1-5 GM/200ML-% IV SOLN
INTRAVENOUS | Status: AC
  Filled 2016-12-19: qty 200

## 2016-12-19 MED ORDER — FENTANYL CITRATE (PF) 100 MCG/2ML IJ SOLN
INTRAMUSCULAR | Status: AC
  Filled 2016-12-19: qty 2

## 2016-12-19 MED ORDER — INSULIN ASPART 100 UNIT/ML ~~LOC~~ SOLN
SUBCUTANEOUS | Status: AC
  Filled 2016-12-19: qty 1

## 2016-12-19 MED ORDER — FENTANYL CITRATE (PF) 100 MCG/2ML IJ SOLN
50.0000 ug | INTRAMUSCULAR | Status: DC | PRN
  Administered 2016-12-19: 50 ug via INTRAVENOUS

## 2016-12-19 MED ORDER — BUPIVACAINE-EPINEPHRINE (PF) 0.5% -1:200000 IJ SOLN
INTRAMUSCULAR | Status: DC | PRN
  Administered 2016-12-19: 30 mL via PERINEURAL

## 2016-12-19 MED ORDER — LACTATED RINGERS IV SOLN
INTRAVENOUS | Status: DC
  Administered 2016-12-19: 12:00:00 via INTRAVENOUS

## 2016-12-19 MED ORDER — CHLORHEXIDINE GLUCONATE 4 % EX LIQD: 60.0000 mL | Freq: Once | CUTANEOUS | Status: DC

## 2016-12-19 MED ORDER — MIDAZOLAM HCL 2 MG/2ML IJ SOLN
INTRAMUSCULAR | Status: AC
  Filled 2016-12-19: qty 2

## 2016-12-19 MED ORDER — VANCOMYCIN HCL IN DEXTROSE 500-5 MG/100ML-% IV SOLN
INTRAVENOUS | Status: AC
  Filled 2016-12-19: qty 100

## 2016-12-19 MED ORDER — PROPOFOL 10 MG/ML IV BOLUS
INTRAVENOUS | Status: AC
  Filled 2016-12-19: qty 20

## 2016-12-19 MED ORDER — PROPOFOL 10 MG/ML IV BOLUS
INTRAVENOUS | Status: DC | PRN
Start: 2016-12-19 — End: 2016-12-19
  Administered 2016-12-19: 260 mg via INTRAVENOUS

## 2016-12-19 MED ORDER — INSULIN ASPART 100 UNIT/ML ~~LOC~~ SOLN
8.0000 [IU] | Freq: Once | SUBCUTANEOUS | Status: AC
Start: 2016-12-19 — End: 2016-12-19
  Administered 2016-12-19: 8 [IU] via INTRAVENOUS

## 2016-12-19 SURGICAL SUPPLY — 73 items
BANDAGE ACE 3X5.8 VEL STRL LF (GAUZE/BANDAGES/DRESSINGS) ×3 IMPLANT
BIT DRILL 2.0 LNG QUCK RELEASE (BIT) IMPLANT
BIT DRILL 2.8 QUICK RELEASE (BIT) IMPLANT
BLADE MINI RND TIP GREEN BEAV (BLADE) IMPLANT
BLADE SURG 15 STRL LF DISP TIS (BLADE) ×2 IMPLANT
BLADE SURG 15 STRL SS (BLADE) ×6
BNDG CMPR 9X4 STRL LF SNTH (GAUZE/BANDAGES/DRESSINGS) ×1
BNDG ESMARK 4X9 LF (GAUZE/BANDAGES/DRESSINGS) ×3 IMPLANT
BNDG GAUZE ELAST 4 BULKY (GAUZE/BANDAGES/DRESSINGS) ×3 IMPLANT
CHLORAPREP W/TINT 26ML (MISCELLANEOUS) ×3 IMPLANT
CORDS BIPOLAR (ELECTRODE) ×3 IMPLANT
COVER BACK TABLE 60X90IN (DRAPES) ×3 IMPLANT
COVER MAYO STAND STRL (DRAPES) ×3 IMPLANT
DRAPE EXTREMITY T 121X128X90 (DRAPE) ×3 IMPLANT
DRAPE OEC MINIVIEW 54X84 (DRAPES) ×3 IMPLANT
DRAPE SURG 17X23 STRL (DRAPES) ×3 IMPLANT
DRILL 2.0 LNG QUICK RELEASE (BIT) ×3
DRILL 2.8 QUICK RELEASE (BIT) ×3
GAUZE SPONGE 4X4 12PLY STRL (GAUZE/BANDAGES/DRESSINGS) ×3 IMPLANT
GAUZE XEROFORM 1X8 LF (GAUZE/BANDAGES/DRESSINGS) ×3 IMPLANT
GLOVE BIO SURGEON STRL SZ 6.5 (GLOVE) ×1 IMPLANT
GLOVE BIO SURGEON STRL SZ7.5 (GLOVE) ×3 IMPLANT
GLOVE BIO SURGEONS STRL SZ 6.5 (GLOVE) ×1
GLOVE BIOGEL PI IND STRL 7.0 (GLOVE) IMPLANT
GLOVE BIOGEL PI IND STRL 8 (GLOVE) ×1 IMPLANT
GLOVE BIOGEL PI IND STRL 8.5 (GLOVE) IMPLANT
GLOVE BIOGEL PI INDICATOR 7.0 (GLOVE) ×2
GLOVE BIOGEL PI INDICATOR 8 (GLOVE) ×4
GLOVE BIOGEL PI INDICATOR 8.5 (GLOVE) ×2
GLOVE SURG ORTHO 8.0 STRL STRW (GLOVE) IMPLANT
GOWN STRL REUS W/ TWL LRG LVL3 (GOWN DISPOSABLE) ×1 IMPLANT
GOWN STRL REUS W/TWL LRG LVL3 (GOWN DISPOSABLE) ×3
GOWN STRL REUS W/TWL XL LVL3 (GOWN DISPOSABLE) ×3 IMPLANT
GUIDEWIRE ORTHO 0.054X6 (WIRE) ×6 IMPLANT
NDL HYPO 25X1 1.5 SAFETY (NEEDLE) IMPLANT
NEEDLE HYPO 22GX1.5 SAFETY (NEEDLE) IMPLANT
NEEDLE HYPO 25X1 1.5 SAFETY (NEEDLE) IMPLANT
NS IRRIG 1000ML POUR BTL (IV SOLUTION) ×3 IMPLANT
PACK BASIN DAY SURGERY FS (CUSTOM PROCEDURE TRAY) ×3 IMPLANT
PAD CAST 3X4 CTTN HI CHSV (CAST SUPPLIES) ×1 IMPLANT
PAD CAST 4YDX4 CTTN HI CHSV (CAST SUPPLIES) IMPLANT
PADDING CAST ABS 4INX4YD NS (CAST SUPPLIES) ×2
PADDING CAST ABS COTTON 4X4 ST (CAST SUPPLIES) ×1 IMPLANT
PADDING CAST COTTON 3X4 STRL (CAST SUPPLIES) ×3
PADDING CAST COTTON 4X4 STRL (CAST SUPPLIES)
PLATE ACU LOC PROX STD LEFT (Plate) ×2 IMPLANT
SCREW CORT FT 18X2.3XLCK HEX (Screw) IMPLANT
SCREW CORT FT 20X2.3XLCK HEX (Screw) IMPLANT
SCREW CORTICAL LOCKING 2.3X18M (Screw) ×3 IMPLANT
SCREW CORTICAL LOCKING 2.3X20M (Screw) ×15 IMPLANT
SCREW FX20X2.3XSMTH LCK NS CRT (Screw) IMPLANT
SCREW HEX 3.5X15 NLCKG STRL (Screw) IMPLANT
SCREW HEX 3.5X15MM (Screw) ×6 IMPLANT
SCREW NLCKG 13 3.5X13 HEXA (Screw) IMPLANT
SCREW NON-LOCK 3.5X13 (Screw) ×3 IMPLANT
SLEEVE SCD COMPRESS KNEE MED (MISCELLANEOUS) ×3 IMPLANT
SLING ARM FOAM STRAP LRG (SOFTGOODS) ×2 IMPLANT
SPLINT PLASTER CAST XFAST 3X15 (CAST SUPPLIES) ×10 IMPLANT
SPLINT PLASTER XTRA FASTSET 3X (CAST SUPPLIES) ×20
STOCKINETTE 4X48 STRL (DRAPES) ×3 IMPLANT
SUCTION FRAZIER HANDLE 10FR (MISCELLANEOUS)
SUCTION TUBE FRAZIER 10FR DISP (MISCELLANEOUS) IMPLANT
SUT ETHILON 3 0 PS 1 (SUTURE) IMPLANT
SUT ETHILON 4 0 PS 2 18 (SUTURE) ×4 IMPLANT
SUT VIC AB 3-0 PS1 18 (SUTURE)
SUT VIC AB 3-0 PS1 18XBRD (SUTURE) IMPLANT
SUT VICRYL 4-0 PS2 18IN ABS (SUTURE) ×3 IMPLANT
SYR BULB 3OZ (MISCELLANEOUS) ×3 IMPLANT
SYR CONTROL 10ML LL (SYRINGE) ×2 IMPLANT
TOWEL OR 17X24 6PK STRL BLUE (TOWEL DISPOSABLE) ×6 IMPLANT
TUBE CONNECTING 20'X1/4 (TUBING)
TUBE CONNECTING 20X1/4 (TUBING) IMPLANT
UNDERPAD 30X30 (UNDERPADS AND DIAPERS) ×3 IMPLANT

## 2016-12-19 NOTE — Op Note (Signed)
I assisted Surgeon(s) and Role:    * Betha LoaKevin Kelechi Orgeron, MD - Primary on the Procedure(s): OPEN REDUCTION INTERNAL FIXATION (ORIF) WRIST FRACTURE on 12/19/2016.  I provided assistance on this case as follows: setup. Approach,.isolation of the fracture, reduction, stabilization, fixation, closure and application of the dressing and splint. I was present for the entire case.  Electronically signed by: Nicki ReaperKUZMA,Attilio Zeitler R, MD Date: 12/19/2016 Time: 3:35 PM

## 2016-12-19 NOTE — Brief Op Note (Signed)
12/19/2016  3:29 PM  PATIENT:  Joseph ForthJerry R [name]  46 y.o. male  PRE-OPERATIVE DIAGNOSIS:  wrist fracture  POST-OPERATIVE DIAGNOSIS:  wrist fracture  PROCEDURE:  Procedure(s) with comments: OPEN REDUCTION INTERNAL FIXATION (ORIF) WRIST FRACTURE (Left) - accumed   SURGEON:  Surgeon(s) and Role:    * Betha LoaKevin Erma Raiche, MD - Primary  PHYSICIAN ASSISTANT:   ASSISTANTS: Cindee SaltGary Tomika Eckles, MD   ANESTHESIA:   regional and general  EBL:  Total I/O In: 800 [I.V.:800] Out: -   BLOOD ADMINISTERED:none  DRAINS: none   LOCAL MEDICATIONS USED:  NONE  SPECIMEN:  No Specimen  DISPOSITION OF SPECIMEN:  N/A  COUNTS:  YES  TOURNIQUET:  * Missing tourniquet times found for documented tourniquets in log:  [id number] *  DICTATION: .Note written in EPIC  PLAN OF CARE: Discharge to home after PACU  PATIENT DISPOSITION:  PACU - hemodynamically stable.

## 2016-12-19 NOTE — Anesthesia Postprocedure Evaluation (Addendum)
Anesthesia Post Note  Patient: Joseph [name]  Procedure(s) Performed: Procedure(s) (LRB): OPEN REDUCTION INTERNAL FIXATION (ORIF) WRIST FRACTURE (Left)  Patient location during evaluation: PACU Anesthesia Type: General Level of consciousness: awake and sedated Pain management: pain level controlled Vital Signs Assessment: post-procedure vital signs reviewed and stable Respiratory status: spontaneous breathing Cardiovascular status: stable Postop Assessment: no signs of nausea or vomiting Anesthetic complications: no Comments: BS 297 heading in the right direction.        Last Vitals:  Vitals:   12/19/16 1545 12/19/16 1600  BP: 120/74 (!) 126/92  Pulse: 95 99  Resp: (!) 22 15  Temp:      Last Pain:  Vitals:   12/19/16 1600  TempSrc:   PainSc: 0-No pain   Pain Goal:                 Joseph [name],Joseph [name] Hinata Diener

## 2016-12-19 NOTE — Progress Notes (Signed)
Assisted Dr. Rob Fitzgerald with left, ultrasound guided, axillary block. Side rails up, monitors on throughout procedure. See vital signs in flow sheet. Tolerated Procedure well. 

## 2016-12-19 NOTE — Anesthesia Procedure Notes (Signed)
Procedure Name: LMA Insertion Date/Time: 12/19/2016 2:26 PM Performed by: Chisa Kushner D Pre-anesthesia Checklist: Patient identified, Emergency Drugs available, Suction available and Patient being monitored Patient Re-evaluated:Patient Re-evaluated prior to inductionOxygen Delivery Method: Circle system utilized Preoxygenation: Pre-oxygenation with 100% oxygen Intubation Type: IV induction Ventilation: Mask ventilation without difficulty LMA: LMA inserted LMA Size: 4.0 Number of attempts: 1 Airway Equipment and Method: Bite block Placement Confirmation: positive ETCO2 Tube secured with: Tape Dental Injury: Teeth and Oropharynx as per pre-operative assessment

## 2016-12-19 NOTE — Anesthesia Preprocedure Evaluation (Addendum)
Anesthesia Evaluation  Patient identified by MRN, date of birth, ID band Patient awake    Reviewed: Allergy & Precautions, NPO status , Patient's Chart, lab work & pertinent test results  Airway Mallampati: III  TM Distance: >3 FB Neck ROM: Full    Dental  (+) Dental Advisory Given   Pulmonary asthma , sleep apnea , COPD,  COPD inhaler, Current Smoker,    breath sounds clear to auscultation       Cardiovascular negative cardio ROS   Rhythm:Regular Rate:Normal     Neuro/Psych Anxiety Depression negative neurological ROS     GI/Hepatic Neg liver ROS, GERD  ,  Endo/Other  diabetes, Type 2, Oral Hypoglycemic Agents  Renal/GU negative Renal ROS     Musculoskeletal   Abdominal   Peds  Hematology negative hematology ROS (+)   Anesthesia Other Findings   Reproductive/Obstetrics                            Anesthesia Physical Anesthesia Plan  ASA: II  Anesthesia Plan: General   Post-op Pain Management:  Regional for Post-op pain   Induction: Intravenous  Airway Management Planned: LMA  Additional Equipment:   Intra-op Plan:   Post-operative Plan: Extubation in OR  Informed Consent: I have reviewed the patients History and Physical, chart, labs and discussed the procedure including the risks, benefits and alternatives for the proposed anesthesia with the patient or authorized representative who has indicated his/her understanding and acceptance.   Dental advisory given  Plan Discussed with: CRNA  Anesthesia Plan Comments:         Anesthesia Quick Evaluation

## 2016-12-19 NOTE — Anesthesia Procedure Notes (Signed)
Anesthesia Regional Block:  Axillary brachial plexus block  Pre-Anesthetic Checklist: ,, timeout performed, Correct Patient, Correct Site, Correct Laterality, Correct Procedure, Correct Position, site marked, Risks and benefits discussed,  Surgical consent,  Pre-op evaluation,  At surgeon's request and post-op pain management  Laterality: Left  Prep: chloraprep       Needles:  Injection technique: Single-shot  Needle Type: Echogenic Stimulator Needle     Needle Length: 9cm 9 cm Needle Gauge: 21 and 21 G    Additional Needles:  Procedures: ultrasound guided (picture in chart) and nerve stimulator Axillary brachial plexus block  Nerve Stimulator or Paresthesia:  Response: musculocutaneous, median, radial, ulnar responses , 0.5 mA,   Additional Responses:   Narrative:  Start time: 12/19/2016 1:14 PM End time: 12/19/2016 1:21 PM Injection made incrementally with aspirations every 5 mL.  Performed by: Personally  Anesthesiologist: Marcene DuosFITZGERALD, Shayle Donahoo

## 2016-12-19 NOTE — H&P (Signed)
Joseph [name] is an 46 y.o. male.   Chief Complaint: right distal radius fracture HPI: 46 yo rhd male states he fell from stepladder injuring right wrist.  Seen at [facility] where XR revealed distal radius fracture.  Splinted and followed up in office.  He wishes to undergo operative fixation.  Allergies:  Allergies  Allergen Reactions  . Azithromycin Hives  . Erythromycin Hives  . Keflex [Cephalexin] Nausea Only  . Metformin Nausea And Vomiting  . Symbicort [Budesonide-Formoterol Fumarate] Other (See Comments)    States that 3 doses were used and breathing became worse    Past Medical History:  Diagnosis Date  . Anxiety   . Arthritis   . Asthma   . Chronic back pain   . Chronic pain of left knee   . COPD (chronic obstructive pulmonary disease) (HCC)   . Diabetes mellitus   . GERD (gastroesophageal reflux disease)   . Shortness of breath   . Sleep apnea     Past Surgical History:  Procedure Laterality Date  . CARPAL TUNNEL RELEASE Left   . CATARACT EXTRACTION W/PHACO Right 03/11/2016   Procedure: CATARACT EXTRACTION PHACO AND INTRAOCULAR LENS PLACEMENT (IOC);  Surgeon: Gemma Payor, MD;  Location: [facility];  Service: Ophthalmology;  Laterality: Right;  CDE 4.24  . HERNIA REPAIR Bilateral   . KNEE ARTHROSCOPY WITH MEDIAL MENISECTOMY Left 04/22/2014   Procedure: LEFT KNEE ARTHROSCOPY WITH MEDIAL MENISECTOMY;  Surgeon: Vickki Hearing, MD;  Location: [facility];  Service: Orthopedics;  Laterality: Left;  . KNEE ARTHROSCOPY WITH MEDIAL MENISECTOMY Right 12/06/2015   Procedure: RIGHT KNEE ARTHROSCOPY WITH MEDIAL MENISECTOMY;  Surgeon: Vickki Hearing, MD;  Location: [facility];  Service: Orthopedics;  Laterality: Right;  . SHOULDER ARTHROCENTESIS Right   . VASECTOMY      Family History: Family History  Problem Relation Age of Onset  . Emphysema Maternal Grandfather   . Emphysema Maternal Grandmother   . Clotting disorder Maternal Grandmother     Social History:   reports that he has  been smoking Cigarettes.  He started smoking about 23 years ago. He has a 30.00 pack-year smoking history. He uses smokeless tobacco. He reports that he does not drink alcohol or use drugs.  Medications: Medications Prior to Admission  Medication Sig Dispense Refill  . albuterol (PROAIR HFA) 108 (90 BASE) MCG/ACT inhaler 2 puffs every 4 hours if needed 1 Inhaler 11  . albuterol (PROVENTIL) (2.5 MG/3ML) 0.083% nebulizer solution USE 1 VIAL IN NEBULIZER FOUR TIMES DAILY 360 mL 5  . ALPRAZolam (XANAX) 1 MG tablet TAKE ONE TABLET TWICE DAILY AS NEEDED FOR ANXIETY 60 tablet 5  . aspirin 325 MG EC tablet Take 325 mg by mouth 2 (two) times daily.     Marland Kitchen buPROPion (WELLBUTRIN SR) 150 MG 12 hr tablet TAKE ONE TABLET DAILY FOR THREE DAYS, THEN 1 TABLET TWICE DAILY. (Patient taking differently: Take 150 mg by mouth 2 (two) times daily. ) 60 tablet 5  . citalopram (CELEXA) 40 MG tablet Take 1 tablet (40 mg total) by mouth daily. 30 tablet 5  . DULERA 200-5 MCG/ACT AERO USE TWO PUFFS FIRST THING IN THE MORNINGAND THEN ANOTHER TWO PUFFS ABOUT 12 HOURS LATER 13 g 3  . glipiZIDE (GLUCOTROL) 5 MG tablet Take 2 tabs bid (Patient taking differently: Take 5 mg by mouth 2 (two) times daily before a meal. Take 2 tabs bid) 360 tablet 1  . glucosamine-chondroitin 500-400 MG tablet Take 1 tablet by mouth 3 (three)  times daily.    Marland Kitchen. ipratropium (ATROVENT) 0.02 % nebulizer solution USE 1 VIAL IN NEBULIZER FOUR TIMES DAILY 300 mL 0  . lansoprazole (PREVACID) 30 MG capsule TAKE ONE CAPSULE BY MOUTH TWICE A DAY BEFORE A MEAL 60 capsule 5  . Multiple Vitamin (MULTIVITAMIN WITH MINERALS) TABS Take 1 tablet by mouth every morning. Men's once daily multivitamin packet (vitamin e, calcium, ginseng)    . nabumetone (RELAFEN) 500 MG tablet Take 1 tablet (500 mg total) by mouth 2 (two) times daily. 60 tablet 5  . naproxen (NAPROSYN) 500 MG tablet Take 1 tablet (500 mg total) by mouth 2 (two) times daily with a meal. 30 tablet 1  .  oxyCODONE-acetaminophen (PERCOCET) 7.5-325 MG tablet Take one tablet qid prn pain 120 tablet 0  . Probiotic Product (PROBIOTIC PO) Take 1 tablet by mouth daily.    . Saw Palmetto, Serenoa repens, (SAW PALMETTO PO) Take 1 capsule by mouth daily.    . sildenafil (REVATIO) 20 MG tablet 2-3 tablets po prn 20 tablet 11  . ANDROGEL PUMP 20.25 MG/ACT (1.62%) GEL APPLY TWO PUMPS ONCE DAILY 75 g 5  . ipratropium (ATROVENT) 0.02 % nebulizer solution USE 1 VIAL IN NEBULIZER FOUR TIMES DAILY 300 mL 0  . oxyCODONE-acetaminophen (PERCOCET) 10-325 MG tablet Take 1 tablet by mouth every 4 (four) hours as needed for pain. 30 tablet 0  . sildenafil (REVATIO) 20 MG tablet TAKE 2-3 TABLETS AS NEEDED 20 tablet 11    No results found for this or any previous visit (from the past 48 hour(s)).  No results found.   A comprehensive review of systems was negative.  Blood pressure 116/89, pulse 88, temperature 98.2 F (36.8 C), temperature source Oral, resp. rate 20, height 5\' 10"  (1.778 m), weight 117.9 kg (260 lb), SpO2 94 %.  General appearance: alert, cooperative and appears stated age Head: Normocephalic, without obvious abnormality, atraumatic Neck: supple, symmetrical, trachea midline Resp: clear to auscultation bilaterally Cardio: regular rate and rhythm GI: non-tender Extremities: Intact sensation and capillary refill all digits.  +epl/fpl/io.  No wounds.  Pulses: 2+ and symmetric Skin: Skin color, texture, turgor normal. No rashes or lesions Neurologic: Grossly normal Incision/Wound:none  Assessment/Plan Right distal radius fracture.  Non operative and operative treatment options were discussed with the patient and patient wishes to proceed with operative treatment. Risks, benefits, and alternatives of surgery were discussed and the patient agrees with the plan of care.   Khoa Opdahl R 12/19/2016, 12:06 PM

## 2016-12-19 NOTE — Discharge Instructions (Addendum)
[facility] Instructions °Hand Surgery ° °Wound Care: °Keep your hand elevated above the level of your heart.  Do not allow it to dangle by your side.  Keep the dressing dry and do not remove it unless your doctor advises you to do so.  He will usually change it at the time of your post-op visit.  Moving your fingers is advised to stimulate circulation but will depend on the site of your surgery.  If you have a splint applied, your doctor will advise you regarding movement. ° °Activity: °Do not drive or operate machinery today.  Rest today and then you may return to your normal activity and work as indicated by your physician. ° °Diet:  °Drink liquids today or eat a light diet.  You may resume a regular diet tomorrow.   ° °General expectations: °Pain for two to three days. °Fingers may become slightly swollen. ° °Call your doctor if any of the following occur: °Severe pain not relieved by pain medication. °Elevated temperature. °Dressing soaked with blood. °Inability to move fingers. °White or bluish color to fingers. ° ° °Call your surgeon if you experience:  ° °1.  Fever over 101.0. °2.  Inability to urinate. °3.  Nausea and/or vomiting. °4.  Extreme swelling or bruising at the surgical site. °5.  Continued bleeding from the incision. °6.  Increased pain, redness or drainage from the incision. °7.  Problems related to your pain medication. °8.  Any problems and/or concerns ° ° °Post Anesthesia Home Care Instructions ° °Activity: °Get plenty of rest for the remainder of the day. A responsible adult should stay with you for 24 hours following the procedure.  °For the next 24 hours, DO NOT: °-Drive a car °-Operate machinery °-Drink alcoholic beverages °-Take any medication unless instructed by your physician °-Make any legal decisions or sign important papers. ° °Meals: °Start with liquid foods such as gelatin or soup. Progress to regular foods as tolerated. Avoid greasy, spicy, heavy foods. If nausea and/or vomiting  occur, drink only clear liquids until the nausea and/or vomiting subsides. Call your physician if vomiting continues. ° °Special Instructions/Symptoms: °Your throat may feel dry or sore from the anesthesia or the breathing tube placed in your throat during surgery. If this causes discomfort, gargle with warm salt water. The discomfort should disappear within 24 hours. ° °If you had a scopolamine patch placed behind your ear for the management of post- operative nausea and/or vomiting: ° °1. The medication in the patch is effective for 72 hours, after which it should be removed.  Wrap patch in a tissue and discard in the trash. Wash hands thoroughly with soap and water. °2. You may remove the patch earlier than 72 hours if you experience unpleasant side effects which may include dry mouth, dizziness or visual disturbances. °3. Avoid touching the patch. Wash your hands with soap and water after contact with the patch. °  ° °

## 2016-12-19 NOTE — Transfer of Care (Signed)
Immediate Anesthesia Transfer of Care Note  Patient: Joseph [name]  Procedure(s) Performed: Procedure(s) with comments: OPEN REDUCTION INTERNAL FIXATION (ORIF) WRIST FRACTURE (Left) - accumed   Patient Location: PACU  Anesthesia Type:GA combined with regional for post-op pain  Level of Consciousness: awake and patient cooperative  Airway & Oxygen Therapy: Patient Spontanous Breathing and Patient connected to face mask oxygen  Post-op Assessment: Report given to RN and Post -op Vital signs reviewed and stable  Post vital signs: Reviewed and stable  Last Vitals:  Vitals:   12/19/16 1415 12/19/16 1530  BP: 128/84   Pulse: 92   Resp: (!) 21   Temp:  36.8 C    Last Pain:  Vitals:   12/19/16 1153  TempSrc: Oral         Complications: No apparent anesthesia complications

## 2016-12-19 NOTE — Op Note (Signed)
12/19/2016 [facility]  Operative Note  Pre Op Diagnosis: Left comminuted intraarticular distal radius fracture  Post Op Diagnosis: Left comminuted intraarticular distal radius fracture  Procedure: ORIF Left comminuted intraarticular distal radius fracture, 2 intraarticular fragements  Surgeon: Betha Loa, MD  Assistant: Cindee Salt, MD  Anesthesia: General and Regional  Fluids: Per anesthesia flow sheet  EBL: minimal  Complications: None  Specimen: None  Tourniquet Time:  Total Tourniquet Time Documented: Upper Arm (Left) - 49 minutes Total: Upper Arm (Left) - 49 minutes   Disposition: Stable to PACU  INDICATIONS:  Joseph [name] is a 46 y.o. male states he fell from a stepstool last week injuring his left wrist.  Seen at [facility] where XR revealed distal radius fracture. Splinted and followed up in office.  We discussed nonoperative and operative treatment options.  He wished to proceed with operative fixation.  Risks, benefits, and alternatives of surgery were discussed including the risk of blood loss; infection; damage to nerves, vessels, tendons, ligaments, bone; failure of surgery; need for additional surgery; complications with wound healing; continued pain; nonunion; malunion; stiffness.  We also discussed the possible need for bone graft and the benefits and risks including the possibility of disease transmission.  He voiced understanding of these risks and elected to proceed.   OPERATIVE COURSE:  After being identified preoperatively by myself, the patient and I agreed upon the procedure and site of procedure.  Surgical site was marked.  The risks, benefits and alternatives of the surgery were reviewed and he wished to proceed.  Surgical consent had been signed.  He was given IV vancomycin as preoperative antibiotic prophylaxis.  He was transferred to the operating room and placed on the operating room table in supine position with the Left upper extremity on  an armboard. General and Regional anesthesia was induced by the anesthesiologist.  The Left upper extremity was prepped and draped in normal sterile orthopedic fashion.  A surgical pause was performed between the surgeons, anesthesia and operating room staff, and all were in agreement as to the patient, procedure and site of procedure.  Tourniquet at the proximal aspect of the extremity was inflated to 250 mmHg after exsanguination of the limb with an Esmarch bandage.  Standard volar Sherilyn Cooter approach was used.  The bipolar electrocautery was used to obtain hemostasis.  The superficial and deep portions of the FCR tendon sheath were incised, and the FCR and FPL were swept ulnarly to protect the palmar cutaneous branch of the median nerve.  The brachioradialis was released at the radial side of the radius.  The pronator quadratus was released and elevated with the periosteal elevator.  The fracture site was identified and cleared of soft tissue interposition and hematoma.  It was reduced under direct visualization.  There was intraarticular extension creating two intraarticular fragments.   An AcuMed volar distal radial locking plate was selected.  It was secured to the bone with the guidepins.  C-arm was used in AP and lateral projections to ensure appropriate reduction and position of the hardware and adjustments made as necessary.  Standard AO drilling and measuring technique was used.  A single screw was placed in the slotted hole in the shaft of the plate.  The distal holes were filled with locking pegs with the exception of the styloid holes, which were filled with locking screws.  The remaining holes in the shaft of the plate were filled with nonlocking screws.  Good purchase was obtained.  C-arm was used  in AP, lateral and oblique projections to ensure appropriate reduction and position of hardware, which was the case.  There was no intra-articular penetration.  The wound was copiously irrigated with sterile  saline.  Pronator quadratus was repaired back over top of the plate using 4-0 Vicryl suture.  Vicryl suture was placed in the subcutaneous tissues in an inverted interrupted fashion and the skin was closed with 4-0 nylon in a horizontal mattress fashion.  There was good pronation and supination of the wrist without crepitance.  The wound was then dressed with sterile Xeroform, 4x4s, and wrapped with a Kerlix bandage.  A volar splint was placed and wrapped with Kerlix and Ace bandage.  Tourniquet was deflated at 49 minutes.  Fingertips were pink with brisk capillary refill after deflation of the tourniquet.  Operative drapes were broken down.  The patient was awoken from anesthesia safely.  He was transferred back to the stretcher and taken to the PACU in stable condition.  I will see him back in the office in one week for postoperative followup.  I will give him a prescription for percocet 5/325 1-2 tabs PO q6 hours prn pain, dispense #30.    Joseph [name],Joseph Garmany R, MD Electronically signed, 12/19/16

## 2016-12-20 ENCOUNTER — Encounter (HOSPITAL_BASED_OUTPATIENT_CLINIC_OR_DEPARTMENT_OTHER): Payer: Self-pay | Admitting: Orthopedic Surgery

## 2016-12-23 ENCOUNTER — Ambulatory Visit (INDEPENDENT_AMBULATORY_CARE_PROVIDER_SITE_OTHER): Payer: Medicare Other | Admitting: Orthopaedic Surgery

## 2016-12-25 DIAGNOSIS — S52572A Other intraarticular fracture of lower end of left radius, initial encounter for closed fracture: Secondary | ICD-10-CM | POA: Diagnosis not present

## 2016-12-25 DIAGNOSIS — M25532 Pain in left wrist: Secondary | ICD-10-CM | POA: Diagnosis not present

## 2016-12-25 DIAGNOSIS — M25632 Stiffness of left wrist, not elsewhere classified: Secondary | ICD-10-CM | POA: Diagnosis not present

## 2016-12-25 DIAGNOSIS — R29898 Other symptoms and signs involving the musculoskeletal system: Secondary | ICD-10-CM | POA: Diagnosis not present

## 2016-12-29 DIAGNOSIS — R29898 Other symptoms and signs involving the musculoskeletal system: Secondary | ICD-10-CM | POA: Diagnosis not present

## 2016-12-29 DIAGNOSIS — S52572A Other intraarticular fracture of lower end of left radius, initial encounter for closed fracture: Secondary | ICD-10-CM | POA: Diagnosis not present

## 2016-12-29 DIAGNOSIS — M25632 Stiffness of left wrist, not elsewhere classified: Secondary | ICD-10-CM | POA: Diagnosis not present

## 2016-12-29 DIAGNOSIS — M25532 Pain in left wrist: Secondary | ICD-10-CM | POA: Diagnosis not present

## 2017-01-20 ENCOUNTER — Ambulatory Visit (INDEPENDENT_AMBULATORY_CARE_PROVIDER_SITE_OTHER): Payer: Medicare Other | Admitting: Family Medicine

## 2017-01-20 ENCOUNTER — Telehealth: Payer: Self-pay | Admitting: Family Medicine

## 2017-01-20 ENCOUNTER — Encounter: Payer: Self-pay | Admitting: Family Medicine

## 2017-01-20 VITALS — Temp 98.3°F | Ht 69.5 in | Wt 271.0 lb

## 2017-01-20 DIAGNOSIS — R21 Rash and other nonspecific skin eruption: Secondary | ICD-10-CM | POA: Diagnosis not present

## 2017-01-20 MED ORDER — MUPIROCIN 2 % EX OINT: 1.0000 "application " | TOPICAL_OINTMENT | Freq: Two times a day (BID) | CUTANEOUS | 0 refills | Status: DC

## 2017-01-20 MED ORDER — DOXYCYCLINE HYCLATE 100 MG PO TABS: 100.0000 mg | ORAL_TABLET | Freq: Two times a day (BID) | ORAL | 0 refills | Status: DC

## 2017-01-20 NOTE — Telephone Encounter (Signed)
Seen today. 

## 2017-01-20 NOTE — Telephone Encounter (Signed)
Patient requesting Rx for Percocet 7.5 mg.

## 2017-01-20 NOTE — Progress Notes (Signed)
   Subjective:    Patient ID: Joseph ForthJerry R Wiler, male    DOB: 10/12/1971, 46 y.o.   MRN: [id number]  Rash  This is a new problem. The current episode started 1 to 4 weeks ago. The problem has been gradually worsening since onset. The rash is diffuse. The rash is characterized by redness. It is unknown if there was an exposure to a precipitant. Past treatments include nothing. The treatment provided no relief.   Patient has no other concerns at this time.  History of skin structure infections in the past. Had a cluster occur on his scalp. Also occurring elsewhere on his torso.  Starts as a pimple bump. And gets crusty. Next  Positive tender at times. Next  No fever or chills. Next  Had a similar attack in the past.  Review of Systems  Skin: Positive for rash.       Objective:   Physical Exam  Alert vitals stable, NAD. Blood pressure good on repeat. HEENT normal. Lungs clear. Heart regular rate and rhythm. Skin multiple patches of folliculitis posterior scalp also several patches of impetigo-like eruption across the torso      Assessment & Plan:  Impression skin structure infections long discussion held plan Doxey twice a day. Bactroban twice a day local measures discussed warning signs discussed

## 2017-01-21 NOTE — Telephone Encounter (Signed)
wil address at scheduled three mo f u for pain control

## 2017-01-24 DIAGNOSIS — S52572A Other intraarticular fracture of lower end of left radius, initial encounter for closed fracture: Secondary | ICD-10-CM | POA: Diagnosis not present

## 2017-01-27 ENCOUNTER — Other Ambulatory Visit: Payer: Self-pay | Admitting: Family Medicine

## 2017-01-28 ENCOUNTER — Encounter: Payer: Self-pay | Admitting: Orthopedic Surgery

## 2017-01-28 ENCOUNTER — Ambulatory Visit (INDEPENDENT_AMBULATORY_CARE_PROVIDER_SITE_OTHER): Payer: Medicare Other | Admitting: Orthopedic Surgery

## 2017-01-28 DIAGNOSIS — M25462 Effusion, left knee: Secondary | ICD-10-CM | POA: Diagnosis not present

## 2017-01-28 NOTE — Progress Notes (Signed)
Chief Complaint  Patient presents with  . Follow-up    left knee    Encounter Diagnosis  Name Primary?  . Effusion of knee joint, left Yes     Procedure note injection and aspiration left knee joint  Verbal consent was obtained to aspirate and inject the left knee joint   Timeout was completed to confirm the site of aspiration and injection  An 18-gauge needle was used to aspirate the left knee joint from a suprapatellar lateral approach.  The medications used were 40 mg of Depo-Medrol and 1% lidocaine 3 cc  Anesthesia was provided by ethyl chloride and the skin was prepped with alcohol.  After cleaning the skin with alcohol an 18-gauge needle was used to aspirate the right knee joint.  We obtained 60 cc of fluid  We followed this by injection of 40 mg of Depo-Medrol and 3 cc 1% lidocaine.  There were no complications. A sterile bandage was applied.

## 2017-01-30 ENCOUNTER — Ambulatory Visit (INDEPENDENT_AMBULATORY_CARE_PROVIDER_SITE_OTHER): Payer: Medicare Other | Admitting: Family Medicine

## 2017-01-30 ENCOUNTER — Encounter: Payer: Self-pay | Admitting: Family Medicine

## 2017-01-30 VITALS — BP 128/90 | Ht 69.5 in | Wt 266.1 lb

## 2017-01-30 DIAGNOSIS — E119 Type 2 diabetes mellitus without complications: Secondary | ICD-10-CM | POA: Diagnosis not present

## 2017-01-30 DIAGNOSIS — G894 Chronic pain syndrome: Secondary | ICD-10-CM | POA: Diagnosis not present

## 2017-01-30 DIAGNOSIS — F331 Major depressive disorder, recurrent, moderate: Secondary | ICD-10-CM | POA: Diagnosis not present

## 2017-01-30 LAB — POCT GLYCOSYLATED HEMOGLOBIN (HGB A1C): HEMOGLOBIN A1C: 9.2

## 2017-01-30 MED ORDER — OXYCODONE-ACETAMINOPHEN 7.5-325 MG PO TABS: ORAL_TABLET | ORAL | 0 refills | Status: DC

## 2017-01-30 MED ORDER — DAPAGLIFLOZIN PROPANEDIOL 5 MG PO TABS: 5.0000 mg | ORAL_TABLET | Freq: Every day | ORAL | 5 refills | Status: DC

## 2017-01-30 NOTE — Progress Notes (Signed)
   Subjective:    Patient ID: Joseph [name], male    DOB: Jul 06, 1971, 46 y.o.   MRN: [id number]  HPI This patient was seen today for chronic pain  The medication list was reviewed and updated.   -Compliance with medication: States takes daily   - Number patient states they take daily: Takes 4 tablet per day.   -when was the last dose patient took? Patient states this morning.   The patient was advised the importance of maintaining medication and not using illegal substances with these.  Refills needed: Yes   The patient was educated that we can provide 3 monthly scripts for their medication, it is their responsibility to follow the instructions.  Side effects or complications from medications: None   Patient is aware that pain medications are meant to minimize the severity of the pain to allow their pain levels to improve to allow for better function. They are aware of that pain medications cannot totally remove their pain.  Due for UDT ( at least once per year) :    Patient states not currently getting any pain medication prescribed by any other providers.    Did get some pain meds-- On further history patient continues to manifest fairly substantial pain. Primarily low back. Extending into the legs. Wonders if he may benefit from more injections or other medications.  Recently had surgery of hand and wrist after an accidental laceration  Was on glipizide and now on glyburide, notes his morning sugars have been rising more and more  Patient notes ongoing compliance with antidepressant medication. No obvious side effects. Reports does not miss a dose. Overall continues to help depression substantially. No thoughts of homicide or suicide. Would like to maintain medication.   Patient claims compliance with diabetes medication. No obvious side effects. Reports no substantial low sugar spells. Most numbers are generally in good range when checked fasting. Generally does not miss a  dose of medication. Watching diabetic diet closely  Patient compliant with pain medication. Continues to experience the pain which led to initiation of analgesic intervention. No significant negative side effects. States definitely needs the pain medication to maintain current level of functioning. Does not receive controlled substance pain medication elsewhere.    Watching diet fairly closely    Review of Systems No headache, no major weight loss or weight gain, no chest pain no back pain abdominal pain no change in bowel habits complete ROS otherwise negative     Objective:   Physical Exam Alert vitals stable, NAD. Blood pressure good on repeat. HEENT normal. Lungs clear. Heart regular rate and rhythm. Mental status good no acute distress oriented 3  Diabetic foot exam within normal limits bilateral       Assessment & Plan:  Impression type 2 diabetes poor control discussed at length patient work hard on diet and exercise. Will add jardiance 10 mg daily. #2 COPD ongoing with moderate benefit from medications or 3 chronic depression anxiety patient encouraged to maintain Celexa. Exercise encourage. Patient takes 2 Xanax is in the evening help sleep #4 chronic pain with ongoing worsening. Really feel it's time for him to have chronic pain specialist. I really do not care to increase his medications. And I think he would benefit Someone who could provide therapeutic injections etc. Discussed with the patient he is in agreement

## 2017-02-04 ENCOUNTER — Other Ambulatory Visit: Payer: Self-pay | Admitting: *Deleted

## 2017-02-04 ENCOUNTER — Telehealth: Payer: Self-pay | Admitting: *Deleted

## 2017-02-04 MED ORDER — EMPAGLIFLOZIN 10 MG PO TABS: 10.0000 mg | ORAL_TABLET | ORAL | 5 refills | Status: DC

## 2017-02-04 NOTE — Telephone Encounter (Signed)
Pt prescribed farxiga on 3/1. Office note incomplete. Insurance requiring clinical info for the reason prescribed. See form in folder.   Most insurance companies are requiring to try and fail invokana and jardiance first. Do you want to change med or do PA for farxiga.

## 2017-02-04 NOTE — Telephone Encounter (Signed)
Left message to return call with pt to let him know of the med change. [facility] notified. New script sent to pharm.

## 2017-02-04 NOTE — Telephone Encounter (Signed)
jardiNCE 10 MG QAM Six months worth

## 2017-02-06 ENCOUNTER — Encounter: Payer: Self-pay | Admitting: Family Medicine

## 2017-02-21 ENCOUNTER — Encounter: Payer: Self-pay | Admitting: Orthopedic Surgery

## 2017-02-21 ENCOUNTER — Ambulatory Visit (INDEPENDENT_AMBULATORY_CARE_PROVIDER_SITE_OTHER): Payer: Medicare Other | Admitting: Orthopedic Surgery

## 2017-02-21 DIAGNOSIS — S83242D Other tear of medial meniscus, current injury, left knee, subsequent encounter: Secondary | ICD-10-CM | POA: Diagnosis not present

## 2017-02-21 DIAGNOSIS — L237 Allergic contact dermatitis due to plants, except food: Secondary | ICD-10-CM

## 2017-02-21 DIAGNOSIS — M25462 Effusion, left knee: Secondary | ICD-10-CM | POA: Diagnosis not present

## 2017-02-21 MED ORDER — METHYLPREDNISOLONE 4 MG PO TBPK: ORAL_TABLET | ORAL | 0 refills | Status: DC

## 2017-02-21 NOTE — Patient Instructions (Signed)
Medication sent to your pharmacy  [facility], arrive @ 2:45pm

## 2017-02-21 NOTE — Progress Notes (Signed)
FOLLOW UP VISIT   Patient ID: Joseph [name], male   DOB: 08-15-1971, 46 y.o.   MRN: [id number]  Chief Complaint  Patient presents with  . Follow-up    left knee effusion    HPI Joseph [name] is a 46 y.o. male.   HPI  [id number] year old male presents back with mild to moderate pain and swelling in his left knee. He status post 1 aspiration injection back on February 27 however the pain and swelling have returned with increased swelling at the end of the day. He had a meniscectomy back in or around 2015. No major mechanical symptoms at this time    Review of Systems Review of Systems  Constitutional: Negative for fever.  Skin: Positive for rash.   - poison ivy   Past Surgical History:  Procedure Laterality Date  . CARPAL TUNNEL RELEASE Left   . CATARACT EXTRACTION W/PHACO Right 03/11/2016   Procedure: CATARACT EXTRACTION PHACO AND INTRAOCULAR LENS PLACEMENT (IOC);  Surgeon: Gemma PayorKerry Hunt, MD;  Location: [facility];  Service: Ophthalmology;  Laterality: Right;  CDE 4.24  . HERNIA REPAIR Bilateral   . KNEE ARTHROSCOPY WITH MEDIAL MENISECTOMY Left 04/22/2014   Procedure: LEFT KNEE ARTHROSCOPY WITH MEDIAL MENISECTOMY;  Surgeon: Vickki HearingStanley E Jerlene Rockers, MD;  Location: [facility];  Service: Orthopedics;  Laterality: Left;  . KNEE ARTHROSCOPY WITH MEDIAL MENISECTOMY Right 12/06/2015   Procedure: RIGHT KNEE ARTHROSCOPY WITH MEDIAL MENISECTOMY;  Surgeon: Vickki HearingStanley E Gudelia Eugene, MD;  Location: [facility];  Service: Orthopedics;  Laterality: Right;  . ORIF WRIST FRACTURE Left 12/19/2016   Procedure: OPEN REDUCTION INTERNAL FIXATION (ORIF) WRIST FRACTURE;  Surgeon: Betha LoaKevin Kuzma, MD;  Location: [facility];  Service: Orthopedics;  Laterality: Left;  accumed   . SHOULDER ARTHROCENTESIS Right   . VASECTOMY       Physical Exam  Constitutional: He is oriented to person, place, and time. He appears well-developed and well-nourished. No distress.  Cardiovascular: Normal rate and intact distal pulses.   Neurological:  He is alert and oriented to person, place, and time.  Skin: No rash noted. He is not diaphoretic. No erythema. No pallor.  Multiple areas of redness and papules consistent with poison ivy  Psychiatric: He has a normal mood and affect. His behavior is normal. Judgment and thought content normal.    Left knee effusion moderate is large, ambulatory gait disturbance. Medial tenderness. Flexion full. Extension full. Ligament stable. Muscle tone and strength normal.  Right knee strength muscle tone stability motion normal no tenderness or swelling MEDICAL DECISION MAKING  DATA    X-ray was taken back on 07/29/2016 and showed an effusion joint space narrowing medially no secondary bone changes  Encounter Diagnoses  Name Primary?  . Tear of medial meniscus of left knee, unspecified tear type, unspecified whether old or current tear, subsequent encounter Yes  . Effusion of knee joint, left   . Poison ivy dermatitis     Procedure note injection and aspiration left knee joint  Verbal consent was obtained to aspirate and inject the left knee joint   Timeout was completed to confirm the site of aspiration and injection  An 18-gauge needle was used to aspirate the left knee joint from a suprapatellar lateral approach.  The medications used were 40 mg of Depo-Medrol and 1% lidocaine 3 cc  Anesthesia was provided by ethyl chloride and the skin was prepped with alcohol.  After cleaning the skin with alcohol an 18-gauge needle was used to aspirate the right knee joint.  We obtained 60 cc of fluid with cartilage particles were noted in the joint  We followed this by injection of 40 mg of Depo-Medrol and 3 cc 1% lidocaine.  There were no complications. A sterile bandage was applied.   PLAN(RISK)    MRI left knee  Medrol Dosepak for poison ivy  Return after MRI scan

## 2017-02-25 ENCOUNTER — Ambulatory Visit (HOSPITAL_COMMUNITY)
Admission: RE | Admit: 2017-02-25 | Discharge: 2017-02-25 | Disposition: A | Discharge status: Home / Self Care | Payer: Medicare Other | Source: Ambulatory Visit | Attending: Orthopedic Surgery | Admitting: Orthopedic Surgery

## 2017-02-25 DIAGNOSIS — M7122 Synovial cyst of popliteal space [Baker], left knee: Secondary | ICD-10-CM | POA: Insufficient documentation

## 2017-02-25 DIAGNOSIS — S83242D Other tear of medial meniscus, current injury, left knee, subsequent encounter: Secondary | ICD-10-CM | POA: Diagnosis not present

## 2017-02-25 DIAGNOSIS — M25462 Effusion, left knee: Secondary | ICD-10-CM | POA: Diagnosis not present

## 2017-02-25 DIAGNOSIS — X58XXXD Exposure to other specified factors, subsequent encounter: Secondary | ICD-10-CM | POA: Insufficient documentation

## 2017-03-04 ENCOUNTER — Other Ambulatory Visit: Payer: Self-pay | Admitting: Family Medicine

## 2017-03-04 NOTE — Telephone Encounter (Signed)
Six mo worth ok if time 

## 2017-03-10 ENCOUNTER — Encounter: Payer: Self-pay | Admitting: Orthopedic Surgery

## 2017-03-10 ENCOUNTER — Ambulatory Visit (INDEPENDENT_AMBULATORY_CARE_PROVIDER_SITE_OTHER): Payer: Medicare Other | Admitting: Orthopedic Surgery

## 2017-03-10 DIAGNOSIS — S83242D Other tear of medial meniscus, current injury, left knee, subsequent encounter: Secondary | ICD-10-CM

## 2017-03-10 MED ORDER — OXYCODONE-ACETAMINOPHEN 7.5-325 MG PO TABS: ORAL_TABLET | ORAL | 0 refills | Status: DC

## 2017-03-10 NOTE — Progress Notes (Signed)
Patient ID: Joseph [name], male   DOB: August 13, 1971, 46 y.o.   MRN: [id number]  MRI FOLLOW UP  Chief Complaint  Patient presents with  . Follow-up    MRI REVIEW LEFT KNEE    HPI Joseph [name] is a 46 y.o. male.    MRI OF THE Left knee  Review of Systems  Musculoskeletal: Positive for back pain.    Physical Exam  See prior notes dated 02/21/2017  Data  Independent image interpretation the MRI showed torn medial meniscus with arthritis The report was read as follows    IMPRESSION: 1. Grade 3 signal in the posterior horn and midbody medial meniscus, suspicious for tear although occasionally postoperative signal can appear in a similar fashion. 2. Highly indistinct midportion of the PCL suspicious for partial tearing or severe degeneration. The ACL is more moderately degenerated. 3. Focal chondral defects along the posterior patellar ridge and centrally along the medial femoral condyles. Moderate chondral thinning in the medial compartment with mild chondral thinning in the patellofemoral joint. 4. Small to moderate knee effusion with mildly thickened medial plica. 5. Indistinct low signal medially in Hoffa's fat pad likely represents a small amount of fibrosis. 6. Very small Baker's cyst. 7. Prepatellar subcutaneous edema.     Electronically Signed   By: Joseph [name] M.D.   On: 02/25/2017 16:12     He has opted for arthroscopy left knee with medial meniscectomy. He understands he will need further treatment regarding his arthritis  This procedure has been fully reviewed with the patient and written informed consent has been obtained.   Joseph Canada, MD 03/10/2017 5:13 PM

## 2017-03-10 NOTE — Patient Instructions (Signed)
You have decided to proceed with operative arthroscopy of the knee. You have decided not to continue with nonoperative measures such as but not limited to oral medication, weight loss, activity modification, physical therapy, bracing, or injection.  We will perform operative arthroscopy of the knee. Some of the risks associated with arthroscopic surgery of the knee include but are not limited to Bleeding Infection Swelling Stiffness Blood clot Pain  If you're not comfortable with these risks and would like to continue with nonoperative treatment please let Dr. Harrison know prior to your surgery. 

## 2017-03-11 NOTE — Patient Instructions (Signed)
Joseph [name]  03/11/2017     @   Your procedure is scheduled on 03/20/2017.  Report to [facility] at 6:15 A.M.  Call this number if you have problems the morning of surgery:  [phone]   Remember:  Do not eat food or drink liquids after midnight.  Take these medicines the morning of surgery with A SIP OF WATER Albuterol inhaler and bring wit you, Albuterol neb, Xanax, Wellbutrin, Celexa, Dulera  Prevacid  DO NOT TAKE DIABETIC MEDICATIONS DAY OF SURGERY   Do not wear jewelry, make-up or nail polish.  Do not wear lotions, powders, or perfumes, or deoderant.  Do not shave 48 hours prior to surgery.  Men may shave face and neck.  Do not bring valuables to the hospital.  [facility] is not responsible for any belongings or valuables.  Contacts, dentures or bridgework may not be worn into surgery.  Leave your suitcase in the car.  After surgery it may be brought to your room.  For patients admitted to the hospital, discharge time will be determined by your treatment team.  Patients discharged the day of surgery will not be allowed to drive home.    Please read over the following fact sheets that you were given. Surgical Site Infection Prevention and Anesthesia Post-op Instructions     PATIENT INSTRUCTIONS POST-ANESTHESIA  IMMEDIATELY FOLLOWING SURGERY:  Do not drive or operate machinery for the first twenty four hours after surgery.  Do not make any important decisions for twenty four hours after surgery or while taking narcotic pain medications or sedatives.  If you develop intractable nausea and vomiting or a severe headache please notify your doctor immediately.  FOLLOW-UP:  Please make an appointment with your surgeon as instructed. You do not need to follow up with anesthesia unless specifically instructed to do so.  WOUND CARE INSTRUCTIONS (if applicable):  Keep a dry clean dressing on the anesthesia/puncture wound site if there is drainage.  Once  the wound has quit draining you may leave it open to air.  Generally you should leave the bandage intact for twenty four hours unless there is drainage.  If the epidural site drains for more than 36-48 hours please call the anesthesia department.  QUESTIONS?:  Please feel free to call your physician or the hospital operator if you have any questions, and they will be happy to assist you.      Knee Arthroscopy Knee arthroscopy is a surgical procedure that is used to examine the inside of your knee joint and repair any damage. The surgeon puts a small, lighted instrument with a camera on the tip (arthroscope) through a small incision in your knee. The camera sends pictures to a monitor in the operating room. Your surgeon uses those pictures to guide the surgical instruments through other incisions to the area of damage. Knee arthroscopy can be used to treat many types of knee problems. It may be used:  To repair a torn ligament.  To repair or remove damaged tissue.  To remove a fluid-filled sac (cyst) from your knee. Tell a health care provider about:  Any allergies you have.  All medicines you are taking, including vitamins, herbs, eye drops, creams, and over-the-counter medicines.  Any problems you or family members have had with anesthetic medicines.  Any blood disorders you have.  Any surgeries you have had.  Any medical conditions you have. What are the risks? Generally, this is a safe procedure. However, problems may occur, including:  Infection.  Bleeding.  Damage to blood vessels, nerves, or structures of your knee.  A blood clot that forms in your leg and travels to your lung.  Failure to relieve symptoms. What happens before the procedure?  Ask your health care provider about:  Changing or stopping your regular medicines. This is especially important if you are taking diabetes medicines or blood thinners.  Taking medicines such as aspirin and ibuprofen. These  medicines can thin your blood. Do not take these medicines before your procedure if your health care provider instructs you not to.  Follow your health care provider's instructions about eating or drinking restrictions.  Plan to have someone take you home after the procedure.  If you go home right after the procedure, plan to have someone with you for 24 hours.  Do not drink alcohol unless your health care provider says that you can.  Do not use any tobacco products, including cigarettes, chewing tobacco, or electronic cigarettes unless your health care provider says that you can. If you need help quitting, ask your health care provider.  You may have a physical exam. What happens during the procedure?  An IV tube will be inserted into one of your veins.  You will be given one or more of the following:  A medicine that helps you relax (sedative).  A medicine that numbs the area (local anesthetic).  A medicine that makes you fall asleep (general anesthetic).  A medicine that is injected into your spine that numbs the area below and slightly above the injection site (spinal anesthetic).  A medicine that is injected into an area of your body that numbs everything below the injection site (regional anesthetic).  A cuff may be placed around your upper leg to slow bleeding during the procedure.  The surgeon will make a small number of incisions around your knee.  Your knee joint will be flushed and filled with a germ-free (sterile) solution.  The arthroscope will be passed through an incision into your knee joint.  More instruments will be passed through other incisions to repair your knee as needed.  The fluid will be removed from your knee.  The incisions will be closed with adhesive strips or stitches (sutures).  A bandage (dressing) will be placed over your knee. The procedure may vary among health care providers and hospitals. What happens after the procedure?  Your  blood pressure, heart rate, breathing rate and blood oxygen level will be monitored often until the medicines you were given have worn off.  You may be given medicine for pain.  You may get crutches to help you walk without using your knee to support your body weight.  You may have to wear compression stockings. These stocking help to prevent blood clots and reduce swelling in your legs. This information is not intended to replace advice given to you by your health care provider. Make sure you discuss any questions you have with your health care provider. Document Released: 11/15/2000 Document Revised: 04/25/2016 Document Reviewed: 11/14/2014 Elsevier Interactive Patient Education  2017 [employer].

## 2017-03-12 ENCOUNTER — Encounter: Payer: Self-pay | Admitting: Primary Care

## 2017-03-12 ENCOUNTER — Ambulatory Visit (INDEPENDENT_AMBULATORY_CARE_PROVIDER_SITE_OTHER): Payer: Medicare Other | Admitting: Primary Care

## 2017-03-12 ENCOUNTER — Telehealth: Payer: Self-pay | Admitting: Orthopedic Surgery

## 2017-03-12 ENCOUNTER — Encounter: Payer: Self-pay | Admitting: Family Medicine

## 2017-03-12 VITALS — BP 126/90 | HR 85 | Temp 98.9°F | Ht 69.5 in | Wt 259.0 lb

## 2017-03-12 DIAGNOSIS — K219 Gastro-esophageal reflux disease without esophagitis: Secondary | ICD-10-CM | POA: Diagnosis not present

## 2017-03-12 DIAGNOSIS — R21 Rash and other nonspecific skin eruption: Secondary | ICD-10-CM | POA: Insufficient documentation

## 2017-03-12 DIAGNOSIS — E119 Type 2 diabetes mellitus without complications: Secondary | ICD-10-CM | POA: Diagnosis not present

## 2017-03-12 DIAGNOSIS — G4700 Insomnia, unspecified: Secondary | ICD-10-CM

## 2017-03-12 DIAGNOSIS — E349 Endocrine disorder, unspecified: Secondary | ICD-10-CM | POA: Diagnosis not present

## 2017-03-12 DIAGNOSIS — F32A Depression, unspecified: Secondary | ICD-10-CM

## 2017-03-12 DIAGNOSIS — G4733 Obstructive sleep apnea (adult) (pediatric): Secondary | ICD-10-CM | POA: Diagnosis not present

## 2017-03-12 DIAGNOSIS — L989 Disorder of the skin and subcutaneous tissue, unspecified: Secondary | ICD-10-CM | POA: Diagnosis not present

## 2017-03-12 DIAGNOSIS — J45909 Unspecified asthma, uncomplicated: Secondary | ICD-10-CM | POA: Diagnosis not present

## 2017-03-12 DIAGNOSIS — F329 Major depressive disorder, single episode, unspecified: Secondary | ICD-10-CM

## 2017-03-12 DIAGNOSIS — G894 Chronic pain syndrome: Secondary | ICD-10-CM

## 2017-03-12 DIAGNOSIS — F418 Other specified anxiety disorders: Secondary | ICD-10-CM | POA: Diagnosis not present

## 2017-03-12 DIAGNOSIS — F419 Anxiety disorder, unspecified: Secondary | ICD-10-CM

## 2017-03-12 HISTORY — DX: Rash and other nonspecific skin eruption: R21

## 2017-03-12 MED ORDER — TRAZODONE HCL 50 MG PO TABS: 50.0000 mg | ORAL_TABLET | Freq: Every evening | ORAL | 0 refills | Status: DC | PRN

## 2017-03-12 MED ORDER — TRIAMCINOLONE ACETONIDE 0.1 % EX CREA
1.0000 | TOPICAL_CREAM | Freq: Two times a day (BID) | CUTANEOUS | 0 refills | Status: DC
Start: 2017-03-12 — End: 2017-04-10

## 2017-03-12 NOTE — Assessment & Plan Note (Signed)
To be following with pain management, appointment scheduled for 04/2017. He will have ortho refill his percocet for now. Discussed that I do not manage long term narcotics.

## 2017-03-12 NOTE — Assessment & Plan Note (Signed)
A1C of 9.2 in March 2018. Continue Glipizide and Jardiance (recently added). Recheck A1C, urine microalbumin, and Lipids in early June 2018. Also not managed on statin or ace/arb despite evidence of hyperlipidemia from labs in September 2017. Will re-evaluate this with new A1C in June.

## 2017-03-12 NOTE — Assessment & Plan Note (Signed)
Overall well managed on current regimen. Discouraged frequent use of Xanax due to addictive nature. Will have him try Trazodone HS PRN insomnia and to use Xanax sparingly for breakthrough anxiety. Rx for Trazodone sent to pharmacy, he will update in 2 weeks.

## 2017-03-12 NOTE — Telephone Encounter (Signed)
Patient called stating that Dr. Romeo Apple had wrote him a prescription for some pain pills. They were only for a few days, he was scheduled to see pain management today, but they changed his appointment to 5/16 and he is having surgery soon. He is asking if Dr. Romeo Apple would do a prescription for a 30 day supply.  Oxycodone-Acetaminophen 7.5/325 mg   Take one tablet qid prn pain Taking differently: Take 1 tablet by mouth 4 (four) times daily as needed for moderate pain.

## 2017-03-12 NOTE — Telephone Encounter (Signed)
Routing to Dr Harrison for approval 

## 2017-03-12 NOTE — Assessment & Plan Note (Signed)
Discussed the importance of a healthy diet and regular exercise in order for weight loss, and to reduce the risk of other medical diseases.  

## 2017-03-12 NOTE — Assessment & Plan Note (Signed)
Compliant to CPAP. 

## 2017-03-12 NOTE — Assessment & Plan Note (Signed)
Following with pulmonology. Encouraged him to stop smoking, he has cut back from 2 PPD to 10 cigarettes per day, commended him on this.

## 2017-03-12 NOTE — Assessment & Plan Note (Signed)
Uncertain etiology, could be MRSA. Rx for low dose triamcinolone sent to pharmacy to use BID. Consider dermatology referral if no improvement for biopsy and evaluation.

## 2017-03-12 NOTE — Progress Notes (Signed)
Subjective:    Patient ID: Joseph [name], male    DOB: Jul 08, 1971, 46 y.o.   MRN: [id number]  HPI  Joseph [name] is a 46 year old male who presents today to establish care and discuss the problems mentioned below. Will obtain old records.  1) Anxiety and Depression: Currently managed on Celexa 40 mg, Wellbutrin SR 150 mg, Alprazolam 1-2 mg at bedtime. He cannot sleep without his alprazolam as his mind races. He's tried ZZZquil, melatonin without improvement. He is open to trying something else for insomnia as he's not tried anything prescription strength.   2) Asthma/COPD: Currently managed by pulmonology and managed on Dulera, Atrovent nebulized solution, albuterol inhaler. Current smoker of 20 years, working on cutting back and is down from 2 PPD to 10 cigarettes daily. He also wears a CPAP machine for OSA and is compliant nightly to this.  3) Type 2 Diabetes: Diagnosed several years ago. Currently managed on Jardiance 10 mg, Glipizide 10 mg BID. His last A1C was 9.2 in March 2018 which was when the Jardiance was added to his regimen. He's checking his sugars fasting in the morning which run less than 100. He's lost 20 pounds in the past 1.5 months since starting Jardiance. He endorses a poor diet but has recently been trying to make better choices.   4) Chronic Pain: Currently managed on Percocet and nabumetone for chronic knee, shoulder, wrist, low back pain. He's had several knee surgeries and will be undergoing surgery next week for medial meniscus tear through Orthopedics. He went to pain management this morning and had to reschedule his appointment for mid May due to confusion in his appointment time. He's also undergone numerous steroid injections.  5) Low Testosterone: Previously managed on Androgel Pump. Also managed on sildenafil 20 mg for erectile dysfunction for which he uses infrequently. His last testosterone level was low in September 2017 while managed on the Androgel Pump. He's now  taking an OTC supplement for symptoms as he's not had his Androgel Pump in months. He's feeling fatigued and has little energy to do anything since he's been off of treatment. He is working on improving his diet.    6) GERD: Currently managed on Prevacid 30 mg. Feels well managed on this regimen.   7) Skin Lesions: Present intermittently several months ago. He was treated with antibiotics and continues to notices these lesions to various places to his face and abdomen. His prior PCP suspected these to be MRSA. He's tried OTC cortisone cream without improvement. He did notice some improvement while on prednisone for various MSK flares.   Review of Systems  Constitutional: Positive for fatigue.  Respiratory: Negative for shortness of breath.   Cardiovascular: Negative for chest pain.  Gastrointestinal:       Genella Rife, feels well managed on PPI  Genitourinary:       Testosterone deficiency   Musculoskeletal: Positive for arthralgias and back pain.  Skin:       Numerous skin lesions to face and abdomen.  Neurological: Negative for headaches.  Hematological: Negative for adenopathy.  Psychiatric/Behavioral: Positive for sleep disturbance. The patient is not nervous/anxious.        Feels well managed on current regimen. Does struggle with insomnia without benzos.       Past Medical History:  Diagnosis Date  . Anxiety   . Arthritis   . Asthma   . Chronic back pain   . Chronic pain of left knee   . COPD (chronic obstructive  pulmonary disease) (HCC)   . Diabetes mellitus   . GERD (gastroesophageal reflux disease)   . Shortness of breath   . Sleep apnea      Social History   Social History  . Marital status: Legally Separated    Spouse name: N/A  . Number of children: N/A  . Years of education: N/A   Occupational History  . disability    Social History Main Topics  . Smoking status: Current Every Day Smoker    Packs/day: 1.50    Years: 20.00    Types: Cigarettes    Start date:  12/17/1993  . Smokeless tobacco: Current User  . Alcohol use No  . Drug use: No  . Sexual activity: Yes    Birth control/ protection: Surgical   Other Topics Concern  . Not on file   Social History Narrative  . No narrative on file    Past Surgical History:  Procedure Laterality Date  . CARPAL TUNNEL RELEASE Left   . CATARACT EXTRACTION W/PHACO Right 03/11/2016   Procedure: CATARACT EXTRACTION PHACO AND INTRAOCULAR LENS PLACEMENT (IOC);  Surgeon: Gemma Payor, MD;  Location: [facility];  Service: Ophthalmology;  Laterality: Right;  CDE 4.24  . HERNIA REPAIR Bilateral   . KNEE ARTHROSCOPY WITH MEDIAL MENISECTOMY Left 04/22/2014   Procedure: LEFT KNEE ARTHROSCOPY WITH MEDIAL MENISECTOMY;  Surgeon: Vickki Hearing, MD;  Location: [facility];  Service: Orthopedics;  Laterality: Left;  . KNEE ARTHROSCOPY WITH MEDIAL MENISECTOMY Right 12/06/2015   Procedure: RIGHT KNEE ARTHROSCOPY WITH MEDIAL MENISECTOMY;  Surgeon: Vickki Hearing, MD;  Location: [facility];  Service: Orthopedics;  Laterality: Right;  . ORIF WRIST FRACTURE Left 12/19/2016   Procedure: OPEN REDUCTION INTERNAL FIXATION (ORIF) WRIST FRACTURE;  Surgeon: Betha Loa, MD;  Location: [facility];  Service: Orthopedics;  Laterality: Left;  accumed   . SHOULDER ARTHROCENTESIS Right   . VASECTOMY      Family History  Problem Relation Age of Onset  . Emphysema Maternal Grandfather   . Emphysema Maternal Grandmother   . Clotting disorder Maternal Grandmother     Allergies  Allergen Reactions  . Azithromycin Hives  . Erythromycin Hives  . Keflex [Cephalexin] Nausea Only  . Metformin Nausea And Vomiting  . Symbicort [Budesonide-Formoterol Fumarate] Other (See Comments)    States that 3 doses were used and breathing became worse    Current Outpatient Prescriptions on File Prior to Visit  Medication Sig Dispense Refill  . albuterol (PROVENTIL) (2.5 MG/3ML) 0.083% nebulizer solution USE 1 VIAL IN NEBULIZER FOUR TIMES DAILY  (Patient taking differently: USE 1 VIAL IN NEBULIZER FOUR TIMES DAILY AS NEEDED FOR SHORTNESS OF BREATH) 360 mL 5  . ALPRAZolam (XANAX) 1 MG tablet TAKE ONE TABLET TWICE DAILY AS NEEDED FOR ANXIETY (Patient taking differently: TAKE ONE TABLET TWICE DAILY) 60 tablet 5  . ANDROGEL PUMP 20.25 MG/ACT (1.62%) GEL APPLY TWO PUMPS ONCE DAILY 75 g 5  . buPROPion (WELLBUTRIN SR) 150 MG 12 hr tablet TAKE ONE TABLET DAILY FOR THREE DAYS, THEN 1 TABLET TWICE DAILY. (Patient taking differently: Take 150 mg by mouth 2 (two) times daily. ) 60 tablet 5  . CINNAMON PO Take 2,800 mg by mouth daily. EACH TABLET IS 1400 MG.  TAKES 2 CAPSULES DAILY    . citalopram (CELEXA) 40 MG tablet Take 1 tablet (40 mg total) by mouth daily. 30 tablet 5  . diphenhydrAMINE (BENADRYL) 25 MG tablet Take 25 mg by mouth 2 (two) times daily.    Marland Kitchen  DULERA 200-5 MCG/ACT AERO USE TWO PUFFS FIRST THING IN THE MORNINGAND THEN ANOTHER TWO PUFFS ABOUT 12 HOURS LATER 13 g 3  . empagliflozin (JARDIANCE) 10 MG TABS tablet Take 10 mg by mouth every morning. 30 tablet 5  . glipiZIDE (GLUCOTROL) 5 MG tablet Take 2 tabs bid (Patient taking differently: Take 10 mg by mouth 2 (two) times daily before a meal. ) 360 tablet 1  . ipratropium (ATROVENT) 0.02 % nebulizer solution USE 1 VIAL IN NEBULIZER FOUR TIMES DAILY (Patient taking differently: USE 1 VIAL IN NEBULIZER FOUR TIMES DAILY AS NEEDED) 300 mL 0  . ipratropium (ATROVENT) 0.02 % nebulizer solution USE 1 VIAL IN NEBULIZER FOUR TIMES DAILY 300 mL 0  . lansoprazole (PREVACID) 30 MG capsule TAKE ONE CAPSULE BY MOUTH TWICE A DAY BEFORE A MEAL (Patient taking differently: Take 30 mg by mouth 2 (two) times daily before a meal. BEFORE A MEAL) 60 capsule 5  . Multiple Vitamin (MULTIVITAMIN WITH MINERALS) TABS Take 1 tablet by mouth every morning. Men's once daily multivitamin packet (vitamin e, calcium, ginseng)    . nabumetone (RELAFEN) 500 MG tablet Take 1 tablet (500 mg total) by mouth 2 (two) times daily.  60 tablet 5  . oxyCODONE-acetaminophen (PERCOCET) 7.5-325 MG tablet Take one tablet qid prn pain (Patient taking differently: Take 1 tablet by mouth 4 (four) times daily as needed for moderate pain. ) 8 tablet 0  . Saw Palmetto, Serenoa repens, (SAW PALMETTO PO) Take 1 capsule by mouth daily.    . sildenafil (REVATIO) 20 MG tablet TAKE 2-3 TABLETS AS NEEDED (Patient taking differently: TAKE 3 TABLETS AS NEEDED) 20 tablet 11  . albuterol (PROAIR HFA) 108 (90 BASE) MCG/ACT inhaler 2 puffs every 4 hours if needed (Patient not taking: Reported on 03/12/2017) 1 Inhaler 11  . mupirocin ointment (BACTROBAN) 2 % Place 1 application into the nose 2 (two) times daily. (Patient not taking: Reported on 03/12/2017) 22 g 0   No current facility-administered medications on file prior to visit.     BP 126/90   Pulse 85   Temp 98.9 F (37.2 C) (Oral)   Ht 5' 9.5" (1.765 m)   Wt 259 lb (117.5 kg)   SpO2 98%   BMI 37.70 kg/m    Objective:   Physical Exam  Constitutional: He is oriented to person, place, and time. He appears well-nourished.  Neck: Neck supple.  Cardiovascular: Normal rate and regular rhythm.   Pulmonary/Chest: Effort normal and breath sounds normal. He has no wheezes. He has no rales.  Abdominal: Soft.  Musculoskeletal:       Left knee: He exhibits decreased range of motion.  Neurological: He is alert and oriented to person, place, and time.  Skin: Skin is warm and dry.  Three circular, raised, crusted/scabbed, red skin lesions to right cheek. Same lesion to right lower abdomen and also two lesions to cranium. Do not appear infectious.   Psychiatric: He has a normal mood and affect.          Assessment & Plan:

## 2017-03-12 NOTE — Assessment & Plan Note (Signed)
Last level of 212 in 08/2016 on treatment. Will send to Urology for treatment and management of testosterone replacement. Discussed that obesity will contribute to low levels and to work on weight loss.

## 2017-03-12 NOTE — Patient Instructions (Addendum)
Skin Lesions:  Try triamcinolone 0.1% cream. Apply topically twice daily for 1-2 weeks. Please message me if no improvement in 2 weeks.  Testosterone Deficiency:  You will be contacted regarding your referral to Urology.  Please let us know if you have not heard back within one week.   Insomnia:  Refrain from using the Xanax as this is a highly addictive medication. Try trazodone 50 mg tablets. Take 1-2 tablets by mouth at bedtime as needed for insomnia.   Follow Up:  Please schedule a lab and office appointment in early June 2018 for evaluation of your A1C and cholesterol levels. Have your blood drawn several days before your appointment so we can discuss these levels face-to-face. Make sure you come fasting to your lab appointment.  It was a pleasure to meet you today! Please don't hesitate to call me with any questions. Welcome to [facility]!

## 2017-03-12 NOTE — Progress Notes (Signed)
Pre visit review using our clinic review tool, if applicable. No additional management support is needed unless otherwise documented below in the visit note. 

## 2017-03-12 NOTE — Assessment & Plan Note (Signed)
Stable on Prevacid. Discussed risks for long term PPI use. Discussed weight loss to reduce symptoms.

## 2017-03-13 ENCOUNTER — Telehealth: Payer: Self-pay | Admitting: Orthopedic Surgery

## 2017-03-13 ENCOUNTER — Telehealth: Payer: Self-pay | Admitting: Primary Care

## 2017-03-13 NOTE — Telephone Encounter (Signed)
Give first available appt please

## 2017-03-13 NOTE — Telephone Encounter (Signed)
Absolutely NOT

## 2017-03-13 NOTE — Telephone Encounter (Signed)
Patient just called asking if it was possible for Dr. Romeo Apple to see him and drain the fluid off his knee. I know that Dr. Mort Sawyers schedule for tomorrow is full so please advise.

## 2017-03-13 NOTE — Telephone Encounter (Signed)
Please advise patient that Dr Romeo Apple is not will to do this.

## 2017-03-13 NOTE — Telephone Encounter (Signed)
Patient returned my call and I told him that Dr. Romeo Apple was not willing to do this. He was ok with this.

## 2017-03-17 ENCOUNTER — Encounter (HOSPITAL_COMMUNITY): Payer: Self-pay

## 2017-03-17 ENCOUNTER — Encounter (HOSPITAL_COMMUNITY)
Admission: RE | Admit: 2017-03-17 | Discharge: 2017-03-17 | Disposition: A | Discharge status: Home / Self Care | Payer: Medicare Other | Source: Ambulatory Visit | Attending: Orthopedic Surgery | Admitting: Orthopedic Surgery

## 2017-03-17 DIAGNOSIS — Z832 Family history of diseases of the blood and blood-forming organs and certain disorders involving the immune mechanism: Secondary | ICD-10-CM | POA: Diagnosis not present

## 2017-03-17 DIAGNOSIS — Z825 Family history of asthma and other chronic lower respiratory diseases: Secondary | ICD-10-CM | POA: Diagnosis not present

## 2017-03-17 DIAGNOSIS — Z9841 Cataract extraction status, right eye: Secondary | ICD-10-CM | POA: Diagnosis not present

## 2017-03-17 DIAGNOSIS — Z7984 Long term (current) use of oral hypoglycemic drugs: Secondary | ICD-10-CM | POA: Diagnosis not present

## 2017-03-17 DIAGNOSIS — M25462 Effusion, left knee: Secondary | ICD-10-CM

## 2017-03-17 DIAGNOSIS — F17210 Nicotine dependence, cigarettes, uncomplicated: Secondary | ICD-10-CM | POA: Diagnosis not present

## 2017-03-17 DIAGNOSIS — Z79899 Other long term (current) drug therapy: Secondary | ICD-10-CM | POA: Diagnosis not present

## 2017-03-17 DIAGNOSIS — F419 Anxiety disorder, unspecified: Secondary | ICD-10-CM | POA: Diagnosis not present

## 2017-03-17 DIAGNOSIS — G4730 Sleep apnea, unspecified: Secondary | ICD-10-CM | POA: Diagnosis not present

## 2017-03-17 DIAGNOSIS — S83242A Other tear of medial meniscus, current injury, left knee, initial encounter: Secondary | ICD-10-CM | POA: Diagnosis not present

## 2017-03-17 DIAGNOSIS — M659 Synovitis and tenosynovitis, unspecified: Secondary | ICD-10-CM | POA: Diagnosis not present

## 2017-03-17 DIAGNOSIS — K219 Gastro-esophageal reflux disease without esophagitis: Secondary | ICD-10-CM | POA: Diagnosis not present

## 2017-03-17 DIAGNOSIS — Z9889 Other specified postprocedural states: Secondary | ICD-10-CM

## 2017-03-17 DIAGNOSIS — G8929 Other chronic pain: Secondary | ICD-10-CM | POA: Diagnosis not present

## 2017-03-17 DIAGNOSIS — M1990 Unspecified osteoarthritis, unspecified site: Secondary | ICD-10-CM | POA: Diagnosis not present

## 2017-03-17 DIAGNOSIS — J449 Chronic obstructive pulmonary disease, unspecified: Secondary | ICD-10-CM | POA: Diagnosis not present

## 2017-03-17 DIAGNOSIS — M549 Dorsalgia, unspecified: Secondary | ICD-10-CM | POA: Diagnosis not present

## 2017-03-17 DIAGNOSIS — Z880 Allergy status to penicillin: Secondary | ICD-10-CM | POA: Diagnosis not present

## 2017-03-17 DIAGNOSIS — M25562 Pain in left knee: Secondary | ICD-10-CM | POA: Insufficient documentation

## 2017-03-17 DIAGNOSIS — Z01812 Encounter for preprocedural laboratory examination: Secondary | ICD-10-CM | POA: Insufficient documentation

## 2017-03-17 DIAGNOSIS — X58XXXA Exposure to other specified factors, initial encounter: Secondary | ICD-10-CM | POA: Diagnosis not present

## 2017-03-17 DIAGNOSIS — M94261 Chondromalacia, right knee: Secondary | ICD-10-CM | POA: Diagnosis not present

## 2017-03-17 DIAGNOSIS — Y939 Activity, unspecified: Secondary | ICD-10-CM | POA: Diagnosis not present

## 2017-03-17 DIAGNOSIS — E119 Type 2 diabetes mellitus without complications: Secondary | ICD-10-CM | POA: Diagnosis not present

## 2017-03-17 DIAGNOSIS — Z9109 Other allergy status, other than to drugs and biological substances: Secondary | ICD-10-CM | POA: Diagnosis not present

## 2017-03-17 LAB — CBC
HCT: 41.1 % (ref 39.0–52.0)
HEMOGLOBIN: 14 g/dL (ref 13.0–17.0)
MCH: 30.6 pg (ref 26.0–34.0)
MCHC: 34.1 g/dL (ref 30.0–36.0)
MCV: 89.7 fL (ref 78.0–100.0)
PLATELETS: 214 10*3/uL (ref 150–400)
RBC: 4.58 MIL/uL (ref 4.22–5.81)
RDW: 13.4 % (ref 11.5–15.5)
WBC: 8.4 10*3/uL (ref 4.0–10.5)

## 2017-03-17 LAB — BASIC METABOLIC PANEL
ANION GAP: 10 (ref 5–15)
BUN: 8 mg/dL (ref 6–20)
CHLORIDE: 101 mmol/L (ref 101–111)
CO2: 23 mmol/L (ref 22–32)
Calcium: 8.9 mg/dL (ref 8.9–10.3)
Creatinine, Ser: 0.79 mg/dL (ref 0.61–1.24)
GFR calc Af Amer: >60 mL/min (ref >60)
GLUCOSE: 342 mg/dL — AB (ref 65–99)
POTASSIUM: 3.3 mmol/L — AB (ref 3.5–5.1)
SODIUM: 134 mmol/L — AB (ref 135–145)

## 2017-03-19 NOTE — H&P (Signed)
History and physical for outpatient surgery  Left knee arthroscopy  Chief complaint left knee pain and swelling  This is a 46 year old male who had a knee arthroscopy medial meniscectomy years ago presented back to the office with pain swelling and decreased range of motion. Several aspirations and injections did not improve. We sent for MRI and his MRI shows a new tear of his meniscus along with various degenerative changes and PCL and anterior cruciate ligament degeneration.  He now prefers surgical versus nonsurgical treatment  Review of systems he had a recent left wrist fracture treated with open treatment internal fixation has chronic back pain  Has no major shortness of breath chest pain noted  Review of Systems  All other systems reviewed and are negative.  Past Medical History:  Diagnosis Date  . Anxiety   . Arthritis   . Asthma   . Chronic back pain   . Chronic pain of left knee   . COPD (chronic obstructive pulmonary disease) (HCC)   . Diabetes mellitus   . GERD (gastroesophageal reflux disease)   . Shortness of breath   . Sleep apnea    Past Surgical History:  Procedure Laterality Date  . CARPAL TUNNEL RELEASE Left   . CATARACT EXTRACTION W/PHACO Right 03/11/2016   Procedure: CATARACT EXTRACTION PHACO AND INTRAOCULAR LENS PLACEMENT (IOC);  Surgeon: Gemma Payor, MD;  Location: [facility];  Service: Ophthalmology;  Laterality: Right;  CDE 4.24  . HERNIA REPAIR Bilateral   . KNEE ARTHROSCOPY WITH MEDIAL MENISECTOMY Left 04/22/2014   Procedure: LEFT KNEE ARTHROSCOPY WITH MEDIAL MENISECTOMY;  Surgeon: Vickki Hearing, MD;  Location: [facility];  Service: Orthopedics;  Laterality: Left;  . KNEE ARTHROSCOPY WITH MEDIAL MENISECTOMY Right 12/06/2015   Procedure: RIGHT KNEE ARTHROSCOPY WITH MEDIAL MENISECTOMY;  Surgeon: Vickki Hearing, MD;  Location: [facility];  Service: Orthopedics;  Laterality: Right;  . ORIF WRIST FRACTURE Left 12/19/2016   Procedure: OPEN REDUCTION INTERNAL  FIXATION (ORIF) WRIST FRACTURE;  Surgeon: Betha Loa, MD;  Location: [facility];  Service: Orthopedics;  Laterality: Left;  accumed   . SHOULDER ARTHROCENTESIS Right   . VASECTOMY      Social History  Substance Use Topics  . Smoking status: Current Every Day Smoker    Packs/day: 1.50    Years: 20.00    Types: Cigarettes    Start date: 12/17/1993  . Smokeless tobacco: Current User  . Alcohol use No    No current facility-administered medications for this encounter.   Current Outpatient Prescriptions:  .  albuterol (PROVENTIL) (2.5 MG/3ML) 0.083% nebulizer solution, USE 1 VIAL IN NEBULIZER FOUR TIMES DAILY (Patient taking differently: USE 1 VIAL IN NEBULIZER FOUR TIMES DAILY AS NEEDED FOR SHORTNESS OF BREATH), Disp: 360 mL, Rfl: 5 .  ALPRAZolam (XANAX) 1 MG tablet, TAKE ONE TABLET TWICE DAILY AS NEEDED FOR ANXIETY (Patient taking differently: TAKE ONE TABLET TWICE DAILY), Disp: 60 tablet, Rfl: 5 .  buPROPion (WELLBUTRIN SR) 150 MG 12 hr tablet, TAKE ONE TABLET DAILY FOR THREE DAYS, THEN 1 TABLET TWICE DAILY. (Patient taking differently: Take 150 mg by mouth 2 (two) times daily. ), Disp: 60 tablet, Rfl: 5 .  CINNAMON PO, Take 2,800 mg by mouth daily. EACH TABLET IS 1400 MG.  TAKES 2 CAPSULES DAILY, Disp: , Rfl:  .  citalopram (CELEXA) 40 MG tablet, Take 1 tablet (40 mg total) by mouth daily., Disp: 30 tablet, Rfl: 5 .  diphenhydrAMINE (BENADRYL) 25 MG tablet, Take 25 mg by mouth  2 (two) times daily., Disp: , Rfl:  .  DULERA 200-5 MCG/ACT AERO, USE TWO PUFFS FIRST THING IN THE MORNINGAND THEN ANOTHER TWO PUFFS ABOUT 12 HOURS LATER, Disp: 13 g, Rfl: 3 .  empagliflozin (JARDIANCE) 10 MG TABS tablet, Take 10 mg by mouth every morning., Disp: 30 tablet, Rfl: 5 .  glipiZIDE (GLUCOTROL) 5 MG tablet, Take 2 tabs bid (Patient taking differently: Take 10 mg by mouth 2 (two) times daily before a meal. ), Disp: 360 tablet, Rfl: 1 .  ipratropium (ATROVENT) 0.02 % nebulizer solution, USE 1  VIAL IN NEBULIZER FOUR TIMES DAILY (Patient taking differently: USE 1 VIAL IN NEBULIZER FOUR TIMES DAILY AS NEEDED), Disp: 300 mL, Rfl: 0 .  lansoprazole (PREVACID) 30 MG capsule, TAKE ONE CAPSULE BY MOUTH TWICE A DAY BEFORE A MEAL (Patient taking differently: Take 30 mg by mouth 2 (two) times daily before a meal. BEFORE A MEAL), Disp: 60 capsule, Rfl: 5 .  Multiple Vitamin (MULTIVITAMIN WITH MINERALS) TABS, Take 1 tablet by mouth every morning. Men's once daily multivitamin packet (vitamin e, calcium, ginseng), Disp: , Rfl:  .  mupirocin ointment (BACTROBAN) 2 %, Place 1 application into the nose 2 (two) times daily. (Patient not taking: Reported on 03/12/2017), Disp: 22 g, Rfl: 0 .  nabumetone (RELAFEN) 500 MG tablet, Take 1 tablet (500 mg total) by mouth 2 (two) times daily., Disp: 60 tablet, Rfl: 5 .  oxyCODONE-acetaminophen (PERCOCET) 7.5-325 MG tablet, Take one tablet qid prn pain (Patient taking differently: Take 1 tablet by mouth 4 (four) times daily as needed for moderate pain. ), Disp: 8 tablet, Rfl: 0 .  Saw Palmetto, Serenoa repens, (SAW PALMETTO PO), Take 1 capsule by mouth daily., Disp: , Rfl:  .  sildenafil (REVATIO) 20 MG tablet, TAKE 2-3 TABLETS AS NEEDED (Patient taking differently: TAKE 3 TABLETS AS NEEDED), Disp: 20 tablet, Rfl: 11 .  albuterol (PROAIR HFA) 108 (90 BASE) MCG/ACT inhaler, 2 puffs every 4 hours if needed (Patient not taking: Reported on 03/12/2017), Disp: 1 Inhaler, Rfl: 11 .  ANDROGEL PUMP 20.25 MG/ACT (1.62%) GEL, APPLY TWO PUMPS ONCE DAILY, Disp: 75 g, Rfl: 5 .  ipratropium (ATROVENT) 0.02 % nebulizer solution, USE 1 VIAL IN NEBULIZER FOUR TIMES DAILY, Disp: 300 mL, Rfl: 0 .  traZODone (DESYREL) 50 MG tablet, Take 1-2 tablets (50-100 mg total) by mouth at bedtime as needed for sleep., Disp: 30 tablet, Rfl: 0 .  triamcinolone cream (KENALOG) 0.1 %, Apply 1 application topically 2 (two) times daily., Disp: 30 g, Rfl: 0  Family History  Problem Relation Age of Onset   . Emphysema Maternal Grandfather   . Emphysema Maternal Grandmother   . Clotting disorder Maternal Grandmother     BP 121/80   Temp 98.1 F (36.7 C) (Oral)   Resp (!) 21   SpO2 100%    The patient is well-developed well-nourished large frame grooming and hygiene are normal He is alert and oriented 3 His mood and affect are normal His gait and station show a small antalgic gait favoring his left lower extremity  Right and left upper extremity no major malalignment issues. No contracture subluxation atrophy or tremor. Gross motor strength is normal. Neurovascular exam is intact  On the right lower extremities we find normal alignment, normal range of motion, no ligamentous instability. No atrophy tremor or muscle weakness. Normal sensation and distal pulses  On the left lower extremities we find a small to medium size joint effusion with flexion limited to  120 small flexion contracture 5. There is no ligamentous instability. He does have medial and lateral joint line tenderness mild patellofemoral crepitance  Gross motor strength is intact. His neurovascular exam is normal  Plain films show  X-ray shows mild joint space narrowing of the medial joint line with mild sclerosis no cyst formation no osteophytes Overall alignment physiologic valgus  MRI report is as follows IMPRESSION: 1. Grade 3 signal in the posterior horn and midbody medial meniscus, suspicious for tear although occasionally postoperative signal can appear in a similar fashion. 2. Highly indistinct midportion of the PCL suspicious for partial tearing or severe degeneration. The ACL is more moderately degenerated. 3. Focal chondral defects along the posterior patellar ridge and centrally along the medial femoral condyles. Moderate chondral thinning in the medial compartment with mild chondral thinning in the patellofemoral joint. 4. Small to moderate knee effusion with mildly thickened medial plica. 5.  Indistinct low signal medially in Hoffa's fat pad likely represents a small amount of fibrosis. 6. Very small Baker's cyst. 7. Prepatellar subcutaneous edema.     Electronically Signed   By: Gaylyn Rong M.D.   On: 02/25/2017 16:12   Presumptive diagnosis is medial meniscus tear with osteoarthritis of the left knee and we plan to do a  Arthroscopy of the left knee and a partial medial meniscectomy

## 2017-03-20 ENCOUNTER — Encounter (HOSPITAL_COMMUNITY): Payer: Self-pay | Admitting: *Deleted

## 2017-03-20 ENCOUNTER — Ambulatory Visit (HOSPITAL_COMMUNITY): Payer: Medicare Other | Admitting: Anesthesiology

## 2017-03-20 ENCOUNTER — Encounter (HOSPITAL_COMMUNITY)
Admission: RE | Disposition: A | Discharge status: Home / Self Care | Payer: Self-pay | Source: Ambulatory Visit | Attending: Orthopedic Surgery

## 2017-03-20 ENCOUNTER — Ambulatory Visit (HOSPITAL_COMMUNITY)
Admission: RE | Admit: 2017-03-20 | Discharge: 2017-03-20 | Disposition: A | Discharge status: Home / Self Care | Payer: Medicare Other | Source: Ambulatory Visit | Attending: Orthopedic Surgery | Admitting: Orthopedic Surgery

## 2017-03-20 DIAGNOSIS — Z825 Family history of asthma and other chronic lower respiratory diseases: Secondary | ICD-10-CM | POA: Insufficient documentation

## 2017-03-20 DIAGNOSIS — M23322 Other meniscus derangements, posterior horn of medial meniscus, left knee: Secondary | ICD-10-CM | POA: Diagnosis not present

## 2017-03-20 DIAGNOSIS — Z9841 Cataract extraction status, right eye: Secondary | ICD-10-CM | POA: Insufficient documentation

## 2017-03-20 DIAGNOSIS — E119 Type 2 diabetes mellitus without complications: Secondary | ICD-10-CM | POA: Insufficient documentation

## 2017-03-20 DIAGNOSIS — J449 Chronic obstructive pulmonary disease, unspecified: Secondary | ICD-10-CM | POA: Diagnosis not present

## 2017-03-20 DIAGNOSIS — K219 Gastro-esophageal reflux disease without esophagitis: Secondary | ICD-10-CM | POA: Diagnosis not present

## 2017-03-20 DIAGNOSIS — M1712 Unilateral primary osteoarthritis, left knee: Secondary | ICD-10-CM | POA: Diagnosis not present

## 2017-03-20 DIAGNOSIS — M94261 Chondromalacia, right knee: Secondary | ICD-10-CM | POA: Diagnosis not present

## 2017-03-20 DIAGNOSIS — M659 Synovitis and tenosynovitis, unspecified: Secondary | ICD-10-CM | POA: Diagnosis not present

## 2017-03-20 DIAGNOSIS — Z7984 Long term (current) use of oral hypoglycemic drugs: Secondary | ICD-10-CM | POA: Insufficient documentation

## 2017-03-20 DIAGNOSIS — Y939 Activity, unspecified: Secondary | ICD-10-CM | POA: Insufficient documentation

## 2017-03-20 DIAGNOSIS — Z832 Family history of diseases of the blood and blood-forming organs and certain disorders involving the immune mechanism: Secondary | ICD-10-CM | POA: Insufficient documentation

## 2017-03-20 DIAGNOSIS — X58XXXA Exposure to other specified factors, initial encounter: Secondary | ICD-10-CM | POA: Insufficient documentation

## 2017-03-20 DIAGNOSIS — G8929 Other chronic pain: Secondary | ICD-10-CM | POA: Insufficient documentation

## 2017-03-20 DIAGNOSIS — Z880 Allergy status to penicillin: Secondary | ICD-10-CM | POA: Insufficient documentation

## 2017-03-20 DIAGNOSIS — S83242A Other tear of medial meniscus, current injury, left knee, initial encounter: Secondary | ICD-10-CM | POA: Insufficient documentation

## 2017-03-20 DIAGNOSIS — F17210 Nicotine dependence, cigarettes, uncomplicated: Secondary | ICD-10-CM | POA: Insufficient documentation

## 2017-03-20 DIAGNOSIS — M1990 Unspecified osteoarthritis, unspecified site: Secondary | ICD-10-CM | POA: Diagnosis not present

## 2017-03-20 DIAGNOSIS — F419 Anxiety disorder, unspecified: Secondary | ICD-10-CM | POA: Diagnosis not present

## 2017-03-20 DIAGNOSIS — Z79899 Other long term (current) drug therapy: Secondary | ICD-10-CM | POA: Insufficient documentation

## 2017-03-20 DIAGNOSIS — G4730 Sleep apnea, unspecified: Secondary | ICD-10-CM | POA: Insufficient documentation

## 2017-03-20 DIAGNOSIS — Z9109 Other allergy status, other than to drugs and biological substances: Secondary | ICD-10-CM | POA: Diagnosis not present

## 2017-03-20 DIAGNOSIS — M549 Dorsalgia, unspecified: Secondary | ICD-10-CM | POA: Diagnosis not present

## 2017-03-20 HISTORY — PX: KNEE ARTHROSCOPY WITH MEDIAL MENISECTOMY: SHX5651

## 2017-03-20 LAB — GLUCOSE, CAPILLARY
Glucose-Capillary: 215 mg/dL — ABNORMAL HIGH (ref 65–99)
Glucose-Capillary: 165 mg/dL — ABNORMAL HIGH (ref 65–99)

## 2017-03-20 SURGERY — ARTHROSCOPY, KNEE, WITH MEDIAL MENISCECTOMY
Anesthesia: General | Laterality: Left

## 2017-03-20 MED ORDER — EPHEDRINE SULFATE 50 MG/ML IJ SOLN
INTRAMUSCULAR | Status: AC
  Filled 2017-03-20: qty 1

## 2017-03-20 MED ORDER — ONDANSETRON HCL 4 MG/2ML IJ SOLN
4.0000 mg | Freq: Once | INTRAMUSCULAR | Status: AC
Start: 2017-03-20 — End: 2017-03-20
  Administered 2017-03-20: 4 mg via INTRAVENOUS

## 2017-03-20 MED ORDER — SODIUM CHLORIDE 0.9 % IJ SOLN
INTRAMUSCULAR | Status: AC
  Filled 2017-03-20: qty 10

## 2017-03-20 MED ORDER — CHLORHEXIDINE GLUCONATE 4 % EX LIQD: 60.0000 mL | Freq: Once | CUTANEOUS | Status: DC

## 2017-03-20 MED ORDER — OXYCODONE-ACETAMINOPHEN 7.5-325 MG PO TABS: ORAL_TABLET | ORAL | 0 refills | Status: DC

## 2017-03-20 MED ORDER — LACTATED RINGERS IV SOLN
INTRAVENOUS | Status: DC
  Administered 2017-03-20: 07:00:00 via INTRAVENOUS

## 2017-03-20 MED ORDER — HYDROMORPHONE HCL 1 MG/ML IJ SOLN: 0.2500 mg | INTRAMUSCULAR | Status: DC | PRN

## 2017-03-20 MED ORDER — BUPIVACAINE-EPINEPHRINE (PF) 0.5% -1:200000 IJ SOLN
INTRAMUSCULAR | Status: DC | PRN
  Administered 2017-03-20: 60 mL via PERINEURAL

## 2017-03-20 MED ORDER — VANCOMYCIN HCL IN DEXTROSE 1-5 GM/200ML-% IV SOLN
1000.0000 mg | INTRAVENOUS | Status: AC
  Administered 2017-03-20: 1000 mg via INTRAVENOUS
  Filled 2017-03-20: qty 200

## 2017-03-20 MED ORDER — SUCCINYLCHOLINE CHLORIDE 20 MG/ML IJ SOLN
INTRAMUSCULAR | Status: AC
  Filled 2017-03-20: qty 1

## 2017-03-20 MED ORDER — LIDOCAINE HCL (PF) 1 % IJ SOLN
INTRAMUSCULAR | Status: AC
  Filled 2017-03-20: qty 5

## 2017-03-20 MED ORDER — FENTANYL CITRATE (PF) 250 MCG/5ML IJ SOLN
INTRAMUSCULAR | Status: AC
  Filled 2017-03-20: qty 5

## 2017-03-20 MED ORDER — OXYCODONE HCL 5 MG PO TABS
ORAL_TABLET | ORAL | Status: AC
  Filled 2017-03-20: qty 1

## 2017-03-20 MED ORDER — DEXTROSE 5 % IV SOLN
INTRAVENOUS | Status: DC | PRN
  Administered 2017-03-20: 07:00:00 via INTRAVENOUS

## 2017-03-20 MED ORDER — PROPOFOL 10 MG/ML IV BOLUS
INTRAVENOUS | Status: DC | PRN
  Administered 2017-03-20: 200 mg via INTRAVENOUS

## 2017-03-20 MED ORDER — GLYCOPYRROLATE 0.2 MG/ML IJ SOLN
INTRAMUSCULAR | Status: AC
  Filled 2017-03-20: qty 1

## 2017-03-20 MED ORDER — ARTIFICIAL TEARS OP OINT
TOPICAL_OINTMENT | OPHTHALMIC | Status: AC
  Filled 2017-03-20: qty 3.5

## 2017-03-20 MED ORDER — MIDAZOLAM HCL 2 MG/2ML IJ SOLN
1.0000 mg | INTRAMUSCULAR | Status: AC
  Administered 2017-03-20 (×2): 2 mg via INTRAVENOUS
  Filled 2017-03-20: qty 2

## 2017-03-20 MED ORDER — BUPIVACAINE-EPINEPHRINE (PF) 0.5% -1:200000 IJ SOLN
INTRAMUSCULAR | Status: AC
  Filled 2017-03-20: qty 60

## 2017-03-20 MED ORDER — MIDAZOLAM HCL 5 MG/5ML IJ SOLN
INTRAMUSCULAR | Status: DC | PRN
  Administered 2017-03-20: 2 mg via INTRAVENOUS

## 2017-03-20 MED ORDER — LIDOCAINE HCL 1 % IJ SOLN
INTRAMUSCULAR | Status: DC | PRN
  Administered 2017-03-20: 35 mg via INTRADERMAL

## 2017-03-20 MED ORDER — IPRATROPIUM-ALBUTEROL 0.5-2.5 (3) MG/3ML IN SOLN
3.0000 mL | Freq: Once | RESPIRATORY_TRACT | Status: AC
  Administered 2017-03-20: 3 mL via RESPIRATORY_TRACT

## 2017-03-20 MED ORDER — KETOROLAC TROMETHAMINE 30 MG/ML IJ SOLN
INTRAMUSCULAR | Status: AC
  Filled 2017-03-20: qty 1

## 2017-03-20 MED ORDER — FENTANYL CITRATE (PF) 100 MCG/2ML IJ SOLN
INTRAMUSCULAR | Status: DC | PRN
  Administered 2017-03-20 (×3): 50 ug via INTRAVENOUS

## 2017-03-20 MED ORDER — OXYCODONE HCL 5 MG PO TABS
5.0000 mg | ORAL_TABLET | Freq: Once | ORAL | Status: AC
  Administered 2017-03-20: 5 mg via ORAL

## 2017-03-20 MED ORDER — EPINEPHRINE PF 1 MG/ML IJ SOLN
INTRAMUSCULAR | Status: AC
  Filled 2017-03-20: qty 5

## 2017-03-20 MED ORDER — GLYCOPYRROLATE 0.2 MG/ML IJ SOLN
0.2000 mg | Freq: Once | INTRAMUSCULAR | Status: AC
  Administered 2017-03-20: 0.2 mg via INTRAVENOUS

## 2017-03-20 MED ORDER — PREGABALIN 50 MG PO CAPS
ORAL_CAPSULE | ORAL | Status: AC
  Filled 2017-03-20: qty 1

## 2017-03-20 MED ORDER — SODIUM CHLORIDE 0.9 % IR SOLN
Status: DC | PRN
  Administered 2017-03-20: 1000 mL

## 2017-03-20 MED ORDER — ONDANSETRON HCL 4 MG/2ML IJ SOLN
INTRAMUSCULAR | Status: AC
  Filled 2017-03-20: qty 2

## 2017-03-20 MED ORDER — MIDAZOLAM HCL 2 MG/2ML IJ SOLN
INTRAMUSCULAR | Status: AC
  Filled 2017-03-20: qty 2

## 2017-03-20 MED ORDER — IPRATROPIUM-ALBUTEROL 0.5-2.5 (3) MG/3ML IN SOLN
RESPIRATORY_TRACT | Status: AC
  Filled 2017-03-20: qty 3

## 2017-03-20 MED ORDER — PROPOFOL 10 MG/ML IV BOLUS
INTRAVENOUS | Status: AC
  Filled 2017-03-20: qty 40

## 2017-03-20 MED ORDER — KETOROLAC TROMETHAMINE 30 MG/ML IJ SOLN
30.0000 mg | Freq: Once | INTRAMUSCULAR | Status: AC
  Administered 2017-03-20: 30 mg via INTRAVENOUS

## 2017-03-20 MED ORDER — ONDANSETRON HCL 4 MG/2ML IJ SOLN
4.0000 mg | Freq: Once | INTRAMUSCULAR | Status: AC
  Administered 2017-03-20: 4 mg via INTRAVENOUS

## 2017-03-20 MED ORDER — PREGABALIN 50 MG PO CAPS
50.0000 mg | ORAL_CAPSULE | Freq: Once | ORAL | Status: AC
  Administered 2017-03-20: 50 mg via ORAL

## 2017-03-20 MED ORDER — SODIUM CHLORIDE 0.9 % IR SOLN
Status: DC | PRN
  Administered 2017-03-20: 9000 mL

## 2017-03-20 SURGICAL SUPPLY — 56 items
ARTHROWAND PARAGON T2 (SURGICAL WAND)
BAG HAMPER (MISCELLANEOUS) ×3 IMPLANT
BANDAGE ELASTIC 6 LF NS (GAUZE/BANDAGES/DRESSINGS) ×3 IMPLANT
BLADE AGGRESSIVE PLUS 4.0 (BLADE) ×3 IMPLANT
BLADE SURG SZ11 CARB STEEL (BLADE) ×3 IMPLANT
BNDG CMPR MED 5X6 ELC HKLP NS (GAUZE/BANDAGES/DRESSINGS) ×1
CHLORAPREP W/TINT 26ML (MISCELLANEOUS) ×3 IMPLANT
CLOTH BEACON ORANGE TIMEOUT ST (SAFETY) ×3 IMPLANT
COOLER CRYO IC GRAV AND TUBE (ORTHOPEDIC SUPPLIES) ×3 IMPLANT
CUFF CRYO KNEE18X23 MED (MISCELLANEOUS) ×2 IMPLANT
CUFF TOURNIQUET SINGLE 34IN LL (TOURNIQUET CUFF) ×2 IMPLANT
CUTTER ANGLED DBL BITE 4.5 (BURR) IMPLANT
DECANTER SPIKE VIAL GLASS SM (MISCELLANEOUS) ×6 IMPLANT
GAUZE SPONGE 4X4 12PLY STRL (GAUZE/BANDAGES/DRESSINGS) ×3 IMPLANT
GAUZE SPONGE 4X4 16PLY XRAY LF (GAUZE/BANDAGES/DRESSINGS) ×3 IMPLANT
GAUZE XEROFORM 5X9 LF (GAUZE/BANDAGES/DRESSINGS) ×3 IMPLANT
GLOVE BIOGEL PI IND STRL 6.5 (GLOVE) IMPLANT
GLOVE BIOGEL PI IND STRL 7.0 (GLOVE) ×1 IMPLANT
GLOVE BIOGEL PI INDICATOR 6.5 (GLOVE) ×2
GLOVE BIOGEL PI INDICATOR 7.0 (GLOVE) ×2
GLOVE SKINSENSE NS SZ8.0 LF (GLOVE) ×2
GLOVE SKINSENSE STRL SZ8.0 LF (GLOVE) ×1 IMPLANT
GLOVE SS N UNI LF 8.5 STRL (GLOVE) ×3 IMPLANT
GOWN STRL REUS W/ TWL LRG LVL3 (GOWN DISPOSABLE) ×1 IMPLANT
GOWN STRL REUS W/TWL LRG LVL3 (GOWN DISPOSABLE) ×3
GOWN STRL REUS W/TWL XL LVL3 (GOWN DISPOSABLE) ×3 IMPLANT
HLDR LEG FOAM (MISCELLANEOUS) ×1 IMPLANT
IV NS IRRIG 3000ML ARTHROMATIC (IV SOLUTION) ×6 IMPLANT
KIT BLADEGUARD II DBL (SET/KITS/TRAYS/PACK) ×3 IMPLANT
KIT ROOM TURNOVER AP CYSTO (KITS) ×3 IMPLANT
LEG HOLDER FOAM (MISCELLANEOUS) ×2
MANIFOLD NEPTUNE II (INSTRUMENTS) ×3 IMPLANT
MARKER SKIN DUAL TIP RULER LAB (MISCELLANEOUS) ×3 IMPLANT
NDL HYPO 18GX1.5 BLUNT FILL (NEEDLE) ×1 IMPLANT
NDL HYPO 21X1.5 SAFETY (NEEDLE) ×1 IMPLANT
NDL SPNL 18GX3.5 QUINCKE PK (NEEDLE) ×1 IMPLANT
NEEDLE HYPO 18GX1.5 BLUNT FILL (NEEDLE) ×3 IMPLANT
NEEDLE HYPO 21X1.5 SAFETY (NEEDLE) ×3 IMPLANT
NEEDLE SPNL 18GX3.5 QUINCKE PK (NEEDLE) ×3 IMPLANT
NS IRRIG 1000ML POUR BTL (IV SOLUTION) ×3 IMPLANT
PACK ARTHRO LIMB DRAPE STRL (MISCELLANEOUS) ×3 IMPLANT
PAD ABD 5X9 TENDERSORB (GAUZE/BANDAGES/DRESSINGS) ×3 IMPLANT
PAD ARMBOARD 7.5X6 YLW CONV (MISCELLANEOUS) ×3 IMPLANT
PADDING CAST COTTON 6X4 STRL (CAST SUPPLIES) ×3 IMPLANT
PADDING WEBRIL 6 STERILE (GAUZE/BANDAGES/DRESSINGS) ×2 IMPLANT
PROBE BIPOLAR 50 DEGREE SUCT (MISCELLANEOUS) ×3 IMPLANT
PROBE BIPOLAR ATHRO 135MM 90D (MISCELLANEOUS) ×1 IMPLANT
SET ARTHROSCOPY INST (INSTRUMENTS) ×3 IMPLANT
SET ARTHROSCOPY PUMP TUBE (IRRIGATION / IRRIGATOR) ×3 IMPLANT
SET BASIN LINEN APH (SET/KITS/TRAYS/PACK) ×3 IMPLANT
SUT ETHILON 3 0 FSL (SUTURE) ×3 IMPLANT
SYR 30ML LL (SYRINGE) ×3 IMPLANT
SYRINGE 10CC LL (SYRINGE) ×3 IMPLANT
TUBE CONNECTING 12'X1/4 (SUCTIONS) ×4
TUBE CONNECTING 12X1/4 (SUCTIONS) ×7 IMPLANT
WAND ARTHRO PARAGON T2 (SURGICAL WAND) IMPLANT

## 2017-03-20 NOTE — Op Note (Signed)
Preop diagnosis left medial meniscal tear  Postoperative diagnosis same  Procedure arthroscopy partial medial meniscectomy left knee  Surgeon Romeo Apple  Anesthesia Gen.  No complications  Sponge and needle counts were correct  Patient went to recovery room in stable condition  Knee arthroscopy dictation  The patient was identified in the preoperative holding area using 2 approved identification mechanisms. The chart was reviewed and updated. The surgical site was confirmed as left knee and marked with an indelible marker.  The patient was taken to the operating room for anesthesia. After successful  general anesthesia, vancomycin (allergy to penicillin) was used as IV antibiotics.  The patient was placed in the supine position with the (left leg ) the operative extremity in an arthroscopic leg holder and the opposite extremity in a padded leg holder.  The timeout was executed.  A lateral portal was established with an 11 blade and the scope was introduced into the joint. A diagnostic arthroscopy was performed in circumferential manner examining the entire knee joint. A medial portal was established and the diagnostic arthroscopy was repeated using a probe to palpate intra-articular structures as they were encountered.   The operative findings are  Findings  re-tear the posterior horn of the medial meniscus Chondral flaps of the medial femoral condyle Lateral compartment normal Degeneration of the anterior cruciate ligament but no tear Moderate synovitis of the knee joint Chondromalacia grade 2 of the patella The joint had a significant amount of debris with cartilaginous particles articular and meniscal which most likely led to the frequent effusions  The medial  meniscus was resected using a duckbill forceps. The meniscal fragments were removed with a motorized shaver. The meniscus was balanced with a combination of a motorized shaver and a 50 ArthroCare wand until a stable  rim was obtained.   A gentle chondroplasty was done on the patella  The arthroscopic pump was placed on the wash mode and any excess debris was removed from the joint using suction.  60 cc of Marcaine with epinephrine was injected through the arthroscope.  The portals were closed with 3-0 nylon suture.  A sterile bandage, Ace wrap and Cryo/Cuff was placed and the Cryo/Cuff was activated. The patient was taken to the recovery room in stable condition.   [id number]

## 2017-03-20 NOTE — Anesthesia Preprocedure Evaluation (Signed)
Anesthesia Evaluation  Patient identified by MRN, date of birth, ID band Patient awake    Reviewed: Allergy & Precautions, H&P , NPO status , Patient's Chart, lab work & pertinent test results  History of Anesthesia Complications (+) history of anesthetic complications (severe coughing after emergence due to smoking & COPD)  Airway Mallampati: II  TM Distance: >3 FB   Mouth opening: Limited Mouth Opening  Dental  (+) Teeth Intact, Partial Upper   Pulmonary shortness of breath and with exertion, asthma , sleep apnea and Continuous Positive Airway Pressure Ventilation , COPD,  COPD inhaler, Current Smoker,    breath sounds clear to auscultation- rhonchi       Cardiovascular  Rhythm:Regular Rate:Normal     Neuro/Psych PSYCHIATRIC DISORDERS Anxiety Depression    GI/Hepatic GERD  Medicated and Controlled,  Endo/Other  diabetes, Poorly Controlled, Type 2, Oral Hypoglycemic AgentsMorbid obesity  Renal/GU      Musculoskeletal   Abdominal   Peds  Hematology   Anesthesia Other Findings   Reproductive/Obstetrics                             Anesthesia Physical Anesthesia Plan  ASA: III  Anesthesia Plan: General   Post-op Pain Management:    Induction: Intravenous  Airway Management Planned: LMA  Additional Equipment:   Intra-op Plan:   Post-operative Plan: Extubation in OR  Informed Consent: I have reviewed the patients History and Physical, chart, labs and discussed the procedure including the risks, benefits and alternatives for the proposed anesthesia with the patient or authorized representative who has indicated his/her understanding and acceptance.     Plan Discussed with:   Anesthesia Plan Comments: (GOT possibility discussed and he agrees with plan.)        Anesthesia Quick Evaluation

## 2017-03-20 NOTE — Interval H&P Note (Signed)
History and Physical Interval Note:  03/20/2017 7:16 AM  Joseph [name]  has presented today for surgery, with the diagnosis of left medial meniscus tear  The various methods of treatment have been discussed with the patient and family. After consideration of risks, benefits and other options for treatment, the patient has consented to  Procedure(s): KNEE ARTHROSCOPY WITH MEDIAL MENISECTOMY (Left) as a surgical intervention .  The patient's history has been reviewed, patient examined, no change in status, stable for surgery.  I have reviewed the patient's chart and labs.  Questions were answered to the patient's satisfaction.     [name]

## 2017-03-20 NOTE — Brief Op Note (Signed)
03/20/2017  8:29 AM  PATIENT:  Joseph [name]  46 y.o. male  PRE-OPERATIVE DIAGNOSIS:  left medial meniscus tear  POST-OPERATIVE DIAGNOSIS:  left medial meniscus tear  Findings  re-tear the posterior horn of the medial meniscus Chondral flaps of the medial femoral condyle Lateral compartment normal Degeneration of the anterior cruciate ligament but no tear Moderate synovitis of the knee joint Chondromalacia grade 2 of the patella  PROCEDURE:  Procedure(s): KNEE ARTHROSCOPY WITH MEDIAL MENISECTOMY (Left)  SURGEON:  Surgeon(s) and Role:    * Vickki Hearing, MD - Primary  PHYSICIAN ASSISTANT:   ASSISTANTS: none   ANESTHESIA:   general  EBL:  Total I/O In: 1000 [I.V.:1000] Out: 0   BLOOD ADMINISTERED:none  DRAINS: none   LOCAL MEDICATIONS USED:  MARCAINE     SPECIMEN:  No Specimen  DISPOSITION OF SPECIMEN:  N/A  COUNTS:  YES  TOURNIQUET:    DICTATION: .Dragon Dictation  PLAN OF CARE: Discharge to home after PACU  PATIENT DISPOSITION:  PACU - hemodynamically stable.   Delay start of Pharmacological VTE agent (>24hrs) due to surgical blood loss or risk of bleeding: not applicable  [id number]

## 2017-03-20 NOTE — Transfer of Care (Signed)
Immediate Anesthesia Transfer of Care Note  Patient: Joseph [name]  Procedure(s) Performed: Procedure(s): KNEE ARTHROSCOPY WITH MEDIAL MENISECTOMY (Left)  Patient Location: PACU  Anesthesia Type:General  Level of Consciousness: awake and patient cooperative  Airway & Oxygen Therapy: Patient Spontanous Breathing and Patient connected to face mask oxygen  Post-op Assessment: Report given to RN, Post -op Vital signs reviewed and stable and Patient moving all extremities  Post vital signs: Reviewed and stable  Last Vitals:  Vitals:   03/20/17 0725 03/20/17 0730  BP: 99/60   Resp: (!) 25 (!) 21  Temp:      Last Pain:  Vitals:   03/20/17 0633  TempSrc: Oral  PainSc: 6       Patients Stated Pain Goal: 5 (03/20/17 1610)  Complications: No apparent anesthesia complications

## 2017-03-20 NOTE — Anesthesia Procedure Notes (Signed)
Procedure Name: LMA Insertion Date/Time: 03/20/2017 7:39 AM Performed by: Despina Hidden Pre-anesthesia Checklist: Patient identified, Patient being monitored, Emergency Drugs available, Timeout performed and Suction available Patient Re-evaluated:Patient Re-evaluated prior to inductionOxygen Delivery Method: Circle System Utilized Preoxygenation: Pre-oxygenation with 100% oxygen Intubation Type: IV induction Ventilation: Mask ventilation without difficulty LMA: LMA inserted LMA Size: 4.0 Number of attempts: 1 Placement Confirmation: positive ETCO2 and breath sounds checked- equal and bilateral Tube secured with: Tape Dental Injury: Teeth and Oropharynx as per pre-operative assessment

## 2017-03-20 NOTE — Discharge Instructions (Addendum)
Knee Arthroscopy, Care After °Refer to this sheet in the next few weeks. These instructions provide you with information about caring for yourself after your procedure. Your health care provider may also give you more specific instructions. Your treatment has been planned according to current medical practices, but problems sometimes occur. Call your health care provider if you have any problems or questions after your procedure. °What can I expect after the procedure? °After the procedure, it is common to have: °· Soreness. °· Pain. ° °Follow these instructions at home: °Bathing °· Do not take baths, swim, or use a hot tub until your health care provider approves. °Incision care °· There are many different ways to close and cover an incision, including stitches, skin glue, and adhesive strips. Follow your health care provider’s instructions about: °? Incision care. °? Bandage (dressing) changes and removal. °? Incision closure removal. °· Check your incision area every day for signs of infection. Watch for: °? Redness, swelling, or pain. °? Fluid, blood, or pus. °Activity °· Avoid strenuous activities for as long as directed by your health care provider. °· Return to your normal activities as directed by your health care provider. Ask your health care provider what activities are safe for you. °· Perform range-of-motion exercises only as directed by your health care provider. °· Do not lift anything that is heavier than 10 lb (4.5 kg). °· Do not drive or operate heavy machinery while taking pain medicine. °· If you were given crutches, use them as directed by your health care provider. °Managing pain, stiffness, and swelling °· If directed, apply ice to the injured area: °? Put ice in a plastic bag. °? Place a towel between your skin and the bag. °? Leave the ice on for 20 minutes, 2-3 times per day. °· Raise the injured area above the level of your heart while you are sitting or lying down as directed by your  health care provider. °General instructions °· Keep all follow-up visits as directed by your health care provider. This is important. °· Take medicines only as directed by your health care provider. °· Do not use any tobacco products, including cigarettes, chewing tobacco, or electronic cigarettes. If you need help quitting, ask your health care provider. °· If you were given compression stockings, wear them as directed by your health care provider. These stockings help prevent blood clots and reduce swelling in your legs. °Contact a health care provider if: °· You have severe pain with any movement of your knee. °· You notice a bad smell coming from the incision or dressing. °· You have redness, swelling, or pain at the site of your incision. °· You have fluid, blood, or pus coming from your incision. °Get help right away if: °· You develop a rash. °· You have a fever. °· You have difficulty breathing or have shortness of breath. °· You develop pain in your calves or in the back of your knee. °· You develop chest pain. °· You develop numbness or tingling in your leg or foot. °This information is not intended to replace advice given to you by your health care provider. Make sure you discuss any questions you have with your health care provider. °Document Released: 06/07/2005 Document Revised: 04/19/2016 Document Reviewed: 11/14/2014 °Elsevier Interactive Patient Education © 2017 [employer] ° °

## 2017-03-20 NOTE — Anesthesia Postprocedure Evaluation (Signed)
Anesthesia Post Note  Patient: Joseph [name]  Procedure(s) Performed: Procedure(s) (LRB): KNEE ARTHROSCOPY WITH MEDIAL MENISECTOMY (Left)  Patient location during evaluation: PACU Anesthesia Type: General Level of consciousness: awake and alert and oriented Pain management: pain level controlled Vital Signs Assessment: post-procedure vital signs reviewed and stable Respiratory status: spontaneous breathing Cardiovascular status: blood pressure returned to baseline Postop Assessment: no signs of nausea or vomiting Anesthetic complications: no     Last Vitals:  Vitals:   03/20/17 0845 03/20/17 0925  BP: 120/71 116/76  Pulse: 80 78  Resp: 17 16  Temp:  36.6 C    Last Pain:  Vitals:   03/20/17 0925  TempSrc: Oral  PainSc: 5                  Adante Courington

## 2017-03-21 ENCOUNTER — Encounter (HOSPITAL_COMMUNITY): Payer: Self-pay | Admitting: Orthopedic Surgery

## 2017-03-24 ENCOUNTER — Ambulatory Visit (INDEPENDENT_AMBULATORY_CARE_PROVIDER_SITE_OTHER): Payer: Self-pay | Admitting: Orthopedic Surgery

## 2017-03-24 ENCOUNTER — Encounter: Payer: Self-pay | Admitting: Orthopedic Surgery

## 2017-03-24 DIAGNOSIS — Z4889 Encounter for other specified surgical aftercare: Secondary | ICD-10-CM

## 2017-03-24 DIAGNOSIS — Z9889 Other specified postprocedural states: Secondary | ICD-10-CM

## 2017-03-24 MED ORDER — OXYCODONE-ACETAMINOPHEN 7.5-325 MG PO TABS: 1.0000 | ORAL_TABLET | Freq: Four times a day (QID) | ORAL | 0 refills | Status: DC | PRN

## 2017-03-24 NOTE — Patient Instructions (Signed)
Home exercises 3 x a day   Ice 3 x a day

## 2017-03-24 NOTE — Progress Notes (Signed)
Chief Complaint  Patient presents with  . Follow-up    Post op #1, SALK. left knee, DOS 03-20-17.   PATIENT:  Joseph [name]  46 y.o. male  PRE-OPERATIVE DIAGNOSIS:  left medial meniscus tear  POST-OPERATIVE DIAGNOSIS:  left medial meniscus tear  Findings  re-tear the posterior horn of the medial meniscus Chondral flaps of the medial femoral condyle Lateral compartment normal Degeneration of the anterior cruciate ligament but no tear Moderate synovitis of the knee joint Chondromalacia grade 2 of the patella  PROCEDURE:  Procedure(s): KNEE ARTHROSCOPY WITH MEDIAL MENISECTOMY (Left)  SURGEON:  Surgeon(s) and Role:    * Vickki Hearing, MD - Primary   Encounter Diagnoses  Name Primary?  Marland Kitchen Aftercare following surgery Yes  . S/P arthroscopy of left knee    Fu 3 weeks

## 2017-04-01 ENCOUNTER — Ambulatory Visit (INDEPENDENT_AMBULATORY_CARE_PROVIDER_SITE_OTHER): Payer: Medicare Other | Admitting: Primary Care

## 2017-04-01 ENCOUNTER — Encounter: Payer: Self-pay | Admitting: Primary Care

## 2017-04-01 VITALS — BP 134/90 | HR 86 | Temp 98.2°F | Ht 69.5 in | Wt 256.1 lb

## 2017-04-01 DIAGNOSIS — R350 Frequency of micturition: Secondary | ICD-10-CM

## 2017-04-01 DIAGNOSIS — E349 Endocrine disorder, unspecified: Secondary | ICD-10-CM

## 2017-04-01 DIAGNOSIS — Z8042 Family history of malignant neoplasm of prostate: Secondary | ICD-10-CM | POA: Diagnosis not present

## 2017-04-01 DIAGNOSIS — N3943 Post-void dribbling: Secondary | ICD-10-CM | POA: Insufficient documentation

## 2017-04-01 DIAGNOSIS — Z125 Encounter for screening for malignant neoplasm of prostate: Secondary | ICD-10-CM | POA: Diagnosis not present

## 2017-04-01 DIAGNOSIS — N400 Enlarged prostate without lower urinary tract symptoms: Secondary | ICD-10-CM | POA: Diagnosis not present

## 2017-04-01 LAB — POC URINALSYSI DIPSTICK (AUTOMATED)
BILIRUBIN UA: NEGATIVE
KETONES UA: NEGATIVE
Leukocytes, UA: NEGATIVE
Nitrite, UA: NEGATIVE
Protein, UA: NEGATIVE
RBC UA: NEGATIVE
SPEC GRAV UA: 1.02 (ref 1.010–1.025)
Urobilinogen, UA: NEGATIVE E.U./dL — AB
pH, UA: 5.5 (ref 5.0–8.0)

## 2017-04-01 LAB — PSA, MEDICARE: PSA: 0.39 ng/ml (ref 0.10–4.00)

## 2017-04-01 MED ORDER — TAMSULOSIN HCL 0.4 MG PO CAPS: 0.4000 mg | ORAL_CAPSULE | Freq: Every day | ORAL | 0 refills | Status: DC

## 2017-04-01 NOTE — Patient Instructions (Addendum)
Start tamsulosin (Flomax) capsules once daily for post void dribbling of urine and incontinence.   Complete lab work prior to leaving today. I will notify you of your results once received.   Please follow up with Urology as scheduled.  Please notify me if no improvement in 2 weeks.  It was a pleasure to see you today!

## 2017-04-01 NOTE — Progress Notes (Signed)
Subjective:    Patient ID: Joseph [name], male    DOB: [birth year], 46 y.o.   MRN: [id number]  HPI  [name] is a 46 year old male with a history of type 2 diabetes and testosterone deficiency who presents today with a chief complaint of urinary incontinence and residual dribbling after urination. He also reports urinary frequency. The postvoid dribbling of urine has become daily for the past 1 month. His episode of incontinence occurred Friday last week. He also reports intermittent nocturia and will get up some nights 1-2 times. He denies fevers, hematuria, chills, difficulty urinating. He does have a family history of prostate cancer in his father.   Review of Systems  Constitutional: Negative for fever.  Gastrointestinal: Negative for nausea.  Genitourinary: Positive for urgency. Negative for dysuria, flank pain, frequency and hematuria.       Post void dribbling, episode of incontinence.       Past Medical History:  Diagnosis Date  . Anxiety   . Arthritis   . Asthma   . Chronic back pain   . Chronic pain of left knee   . COPD (chronic obstructive pulmonary disease) (HCC)   . Diabetes mellitus   . GERD (gastroesophageal reflux disease)   . Shortness of breath   . Sleep apnea      Social History   Social History  . Marital status: Legally Separated    Spouse name: N/A  . Number of children: N/A  . Years of education: N/A   Occupational History  . disability    Social History Main Topics  . Smoking status: Current Every Day Smoker    Packs/day: 1.50    Years: 20.00    Types: Cigarettes    Start date: 12/17/1993  . Smokeless tobacco: Current User  . Alcohol use No  . Drug use: No  . Sexual activity: Yes    Birth control/ protection: Surgical   Other Topics Concern  . Not on file   Social History Narrative  . No narrative on file    Past Surgical History:  Procedure Laterality Date  . CARPAL TUNNEL RELEASE Left   . CATARACT EXTRACTION W/PHACO Right  03/11/2016   Procedure: CATARACT EXTRACTION PHACO AND INTRAOCULAR LENS PLACEMENT (IOC);  Surgeon: Gemma Payor, MD;  Location: [facility];  Service: Ophthalmology;  Laterality: Right;  CDE 4.24  . HERNIA REPAIR Bilateral   . KNEE ARTHROSCOPY WITH MEDIAL MENISECTOMY Left 04/22/2014   Procedure: LEFT KNEE ARTHROSCOPY WITH MEDIAL MENISECTOMY;  Surgeon: Vickki Hearing, MD;  Location: [facility];  Service: Orthopedics;  Laterality: Left;  . KNEE ARTHROSCOPY WITH MEDIAL MENISECTOMY Right 12/06/2015   Procedure: RIGHT KNEE ARTHROSCOPY WITH MEDIAL MENISECTOMY;  Surgeon: Vickki Hearing, MD;  Location: [facility];  Service: Orthopedics;  Laterality: Right;  . KNEE ARTHROSCOPY WITH MEDIAL MENISECTOMY Left 03/20/2017   Procedure: KNEE ARTHROSCOPY WITH MEDIAL MENISECTOMY;  Surgeon: Vickki Hearing, MD;  Location: [facility];  Service: Orthopedics;  Laterality: Left;  . ORIF WRIST FRACTURE Left 12/19/2016   Procedure: OPEN REDUCTION INTERNAL FIXATION (ORIF) WRIST FRACTURE;  Surgeon: Betha Loa, MD;  Location: [facility];  Service: Orthopedics;  Laterality: Left;  accumed   . SHOULDER ARTHROCENTESIS Right   . VASECTOMY      Family History  Problem Relation Age of Onset  . Emphysema Maternal Grandfather   . Emphysema Maternal Grandmother   . Clotting disorder Maternal Grandmother     Allergies  Allergen Reactions  .  Azithromycin Hives  . Erythromycin Hives  . Keflex [Cephalexin] Nausea Only  . Metformin Nausea And Vomiting  . Symbicort [Budesonide-Formoterol Fumarate] Other (See Comments)    States that 3 doses were used and breathing became worse    Current Outpatient Prescriptions on File Prior to Visit  Medication Sig Dispense Refill  . albuterol (PROVENTIL) (2.5 MG/3ML) 0.083% nebulizer solution USE 1 VIAL IN NEBULIZER FOUR TIMES DAILY (Patient taking differently: USE 1 VIAL IN NEBULIZER FOUR TIMES DAILY AS NEEDED FOR SHORTNESS OF BREATH) 360 mL 5  . ALPRAZolam (XANAX) 1 MG tablet TAKE ONE  TABLET TWICE DAILY AS NEEDED FOR ANXIETY (Patient taking differently: TAKE ONE TABLET TWICE DAILY) 60 tablet 5  . buPROPion (WELLBUTRIN SR) 150 MG 12 hr tablet TAKE ONE TABLET DAILY FOR THREE DAYS, THEN 1 TABLET TWICE DAILY. (Patient taking differently: Take 150 mg by mouth 2 (two) times daily. ) 60 tablet 5  . CINNAMON PO Take 2,800 mg by mouth daily. EACH TABLET IS 1400 MG.  TAKES 2 CAPSULES DAILY    . citalopram (CELEXA) 40 MG tablet Take 1 tablet (40 mg total) by mouth daily. 30 tablet 5  . diphenhydrAMINE (BENADRYL) 25 MG tablet Take 25 mg by mouth 2 (two) times daily.    . DULERA 200-5 MCG/ACT AERO USE TWO PUFFS FIRST THING IN THE MORNINGAND THEN ANOTHER TWO PUFFS ABOUT 12 HOURS LATER 13 g 3  . empagliflozin (JARDIANCE) 10 MG TABS tablet Take 10 mg by mouth every morning. 30 tablet 5  . glipiZIDE (GLUCOTROL) 5 MG tablet Take 2 tabs bid (Patient taking differently: Take 10 mg by mouth 2 (two) times daily before a meal. ) 360 tablet 1  . ipratropium (ATROVENT) 0.02 % nebulizer solution USE 1 VIAL IN NEBULIZER FOUR TIMES DAILY (Patient taking differently: USE 1 VIAL IN NEBULIZER FOUR TIMES DAILY AS NEEDED) 300 mL 0  . lansoprazole (PREVACID) 30 MG capsule TAKE ONE CAPSULE BY MOUTH TWICE A DAY BEFORE A MEAL (Patient taking differently: Take 30 mg by mouth 2 (two) times daily before a meal. BEFORE A MEAL) 60 capsule 5  . Multiple Vitamin (MULTIVITAMIN WITH MINERALS) TABS Take 1 tablet by mouth every morning. Men's once daily multivitamin packet (vitamin e, calcium, ginseng)    . mupirocin ointment (BACTROBAN) 2 % Place 1 application into the nose 2 (two) times daily. 22 g 0  . nabumetone (RELAFEN) 500 MG tablet Take 1 tablet (500 mg total) by mouth 2 (two) times daily. 60 tablet 5  . oxyCODONE-acetaminophen (PERCOCET) 7.5-325 MG tablet Take 1 tablet by mouth every 6 (six) hours as needed for severe pain. Take one tablet qid prn pain 28 tablet 0  . Saw Palmetto, Serenoa repens, (SAW PALMETTO PO) Take 1  capsule by mouth daily.    . sildenafil (REVATIO) 20 MG tablet TAKE 2-3 TABLETS AS NEEDED (Patient taking differently: TAKE 3 TABLETS AS NEEDED) 20 tablet 11  . traZODone (DESYREL) 50 MG tablet Take 1-2 tablets (50-100 mg total) by mouth at bedtime as needed for sleep. 30 tablet 0  . triamcinolone cream (KENALOG) 0.1 % Apply 1 application topically 2 (two) times daily. 30 g 0   No current facility-administered medications on file prior to visit.     BP 134/90   Pulse 86   Temp 98.2 F (36.8 C) (Oral)   Ht 5' 9.5" (1.765 m)   Wt 256 lb 1.9 oz (116.2 kg)   SpO2 98%   BMI 37.28 kg/m    Objective:  Physical Exam  Constitutional: He appears well-nourished. He does not appear ill.  Neck: Neck supple.  Cardiovascular: Normal rate and regular rhythm.   Pulmonary/Chest: Effort normal and breath sounds normal.  Abdominal: There is no CVA tenderness.  Skin: Skin is warm and dry.          Assessment & Plan:

## 2017-04-01 NOTE — Assessment & Plan Note (Signed)
Scheduled to see urology in June 2018.

## 2017-04-01 NOTE — Assessment & Plan Note (Addendum)
Present for 1 month, also with one episode of incontinence recently. Overall blood sugars are coming down with current regimen, symptoms unlikely to be caused by uncontrolled glucose levels. UA today without evidence of infection or renal stones. Given family history of prostate cancer in father and current symptoms, we'll check PSA.  Prescription for Flomax once daily used for post void dribbling and nocturia. He will update in 2 weeks if no improvement.

## 2017-04-01 NOTE — Progress Notes (Signed)
Pre visit review using our clinic review tool, if applicable. No additional management support is needed unless otherwise documented below in the visit note. 

## 2017-04-05 ENCOUNTER — Other Ambulatory Visit: Payer: Self-pay | Admitting: Family Medicine

## 2017-04-08 ENCOUNTER — Ambulatory Visit (INDEPENDENT_AMBULATORY_CARE_PROVIDER_SITE_OTHER): Payer: Medicare Other | Admitting: Orthopedic Surgery

## 2017-04-08 ENCOUNTER — Other Ambulatory Visit: Payer: Self-pay | Admitting: Primary Care

## 2017-04-08 ENCOUNTER — Encounter: Payer: Self-pay | Admitting: Orthopedic Surgery

## 2017-04-08 VITALS — BP 143/80 | HR 108 | Ht 70.0 in | Wt 254.0 lb

## 2017-04-08 DIAGNOSIS — Z9889 Other specified postprocedural states: Secondary | ICD-10-CM

## 2017-04-08 DIAGNOSIS — L989 Disorder of the skin and subcutaneous tissue, unspecified: Secondary | ICD-10-CM

## 2017-04-08 DIAGNOSIS — G4700 Insomnia, unspecified: Secondary | ICD-10-CM

## 2017-04-08 DIAGNOSIS — Z4889 Encounter for other specified surgical aftercare: Secondary | ICD-10-CM | POA: Diagnosis not present

## 2017-04-08 DIAGNOSIS — M25462 Effusion, left knee: Secondary | ICD-10-CM

## 2017-04-08 MED ORDER — OXYCODONE-ACETAMINOPHEN 7.5-325 MG PO TABS: 1.0000 | ORAL_TABLET | Freq: Four times a day (QID) | ORAL | 0 refills | Status: DC | PRN

## 2017-04-08 NOTE — Telephone Encounter (Signed)
Electronic refill request. Last office visit:   04/01/17 Last Filled:   Trazodone  30 tablet 0 03/12/2017  Last Filled:   Triamcinolone  30 g 0 03/12/2017  Please advise.

## 2017-04-08 NOTE — Progress Notes (Signed)
Chief Complaint  Patient presents with  . Knee Pain    Persistent swelling s/p scope on 03/20/17   The patient had knee arthroscopy had a lot of cartilage particles in the joint were concerned that he might not be able to stop having effusions, well he came in today with a swollen knee  He has a large effusion is present abutting flexion past 90 there no signs of infection  I recommended injection and aspiration and he agreed Meds ordered this encounter  Medications  . oxyCODONE-acetaminophen (PERCOCET) 7.5-325 MG tablet    Sig: Take 1 tablet by mouth every 6 (six) hours as needed for severe pain.    Dispense:  30 tablet    Refill:  0    Procedure note injection and aspiration left knee joint  Verbal consent was obtained to aspirate and inject the left knee joint   Timeout was completed to confirm the site of aspiration and injection  An 18-gauge needle was used to aspirate the left knee joint from a suprapatellar lateral approach.  The medications used were 40 mg of Depo-Medrol and 1% lidocaine 3 cc  Anesthesia was provided by ethyl chloride and the skin was prepped with alcohol.  After cleaning the skin with alcohol an 18-gauge needle was used to aspirate the right knee joint.  We obtained 50 cc of fluid  We followed this by injection of 40 mg of Depo-Medrol and 3 cc 1% lidocaine.  There were no complications. A sterile bandage was applied.

## 2017-04-09 NOTE — Telephone Encounter (Signed)
Did the triamcinolone help with this skin lesions? Did the trazodone help with sleep?

## 2017-04-10 ENCOUNTER — Other Ambulatory Visit: Payer: Self-pay | Admitting: Family Medicine

## 2017-04-10 ENCOUNTER — Encounter: Payer: Self-pay | Admitting: Primary Care

## 2017-04-10 ENCOUNTER — Other Ambulatory Visit: Payer: Self-pay | Admitting: Primary Care

## 2017-04-10 DIAGNOSIS — E349 Endocrine disorder, unspecified: Secondary | ICD-10-CM

## 2017-04-10 DIAGNOSIS — L989 Disorder of the skin and subcutaneous tissue, unspecified: Secondary | ICD-10-CM

## 2017-04-10 DIAGNOSIS — G4700 Insomnia, unspecified: Secondary | ICD-10-CM

## 2017-04-10 MED ORDER — TRIAMCINOLONE ACETONIDE 0.1 % EX CREA: 1.0000 "application " | TOPICAL_CREAM | Freq: Two times a day (BID) | CUTANEOUS | 0 refills | Status: DC

## 2017-04-10 MED ORDER — TRAZODONE HCL 50 MG PO TABS: 50.0000 mg | ORAL_TABLET | Freq: Every evening | ORAL | 1 refills | Status: DC | PRN

## 2017-04-11 ENCOUNTER — Other Ambulatory Visit: Payer: Self-pay | Admitting: Primary Care

## 2017-04-11 DIAGNOSIS — E349 Endocrine disorder, unspecified: Secondary | ICD-10-CM

## 2017-04-11 NOTE — Telephone Encounter (Signed)
Message left for patient to return my call.  

## 2017-04-14 ENCOUNTER — Encounter: Payer: Self-pay | Admitting: Orthopedic Surgery

## 2017-04-14 ENCOUNTER — Ambulatory Visit (INDEPENDENT_AMBULATORY_CARE_PROVIDER_SITE_OTHER): Payer: Self-pay | Admitting: Orthopedic Surgery

## 2017-04-14 DIAGNOSIS — Z9889 Other specified postprocedural states: Secondary | ICD-10-CM

## 2017-04-14 DIAGNOSIS — Z4889 Encounter for other specified surgical aftercare: Secondary | ICD-10-CM

## 2017-04-14 NOTE — Progress Notes (Signed)
Chief Complaint  Patient presents with  . Follow-up    Recheck on left knee, DOS 03-20-17.    Postop visit status post arthroscopy left knee status post recent postoperative knee aspiration  No effusion today  Knee feels good  He will start pain management soon so we should not have to order any more opioids for him  He will come back periodically for aspirations as needed

## 2017-04-15 ENCOUNTER — Other Ambulatory Visit: Payer: Self-pay | Admitting: Internal Medicine

## 2017-04-15 NOTE — Telephone Encounter (Signed)
Joseph [name], will you please get him scheduled for a lab only appointment? He will need to come between 8am-10am. Terri, do I have the correct order for testosterone?

## 2017-04-16 DIAGNOSIS — M23307 Other meniscus derangements, unspecified meniscus, left knee: Secondary | ICD-10-CM | POA: Diagnosis not present

## 2017-04-16 DIAGNOSIS — K5903 Drug induced constipation: Secondary | ICD-10-CM | POA: Diagnosis not present

## 2017-04-16 DIAGNOSIS — Z79899 Other long term (current) drug therapy: Secondary | ICD-10-CM | POA: Diagnosis not present

## 2017-04-16 DIAGNOSIS — T402X5A Adverse effect of other opioids, initial encounter: Secondary | ICD-10-CM | POA: Diagnosis not present

## 2017-04-16 DIAGNOSIS — G8929 Other chronic pain: Secondary | ICD-10-CM | POA: Diagnosis not present

## 2017-04-16 NOTE — Telephone Encounter (Signed)
Lab appt has been schedule for 04/17/2017

## 2017-04-17 ENCOUNTER — Other Ambulatory Visit (INDEPENDENT_AMBULATORY_CARE_PROVIDER_SITE_OTHER): Payer: Medicare Other

## 2017-04-17 DIAGNOSIS — E349 Endocrine disorder, unspecified: Secondary | ICD-10-CM | POA: Diagnosis not present

## 2017-04-17 LAB — TESTOSTERONE: Testosterone: 208.98 ng/dL — ABNORMAL LOW (ref 300.00–890.00)

## 2017-04-21 ENCOUNTER — Telehealth: Payer: Self-pay

## 2017-04-21 ENCOUNTER — Other Ambulatory Visit: Payer: Self-pay | Admitting: Internal Medicine

## 2017-04-21 MED ORDER — IPRATROPIUM BROMIDE 0.02 % IN SOLN: RESPIRATORY_TRACT | 0 refills | Status: DC

## 2017-04-21 MED ORDER — ALBUTEROL SULFATE (2.5 MG/3ML) 0.083% IN NEBU: INHALATION_SOLUTION | RESPIRATORY_TRACT | 0 refills | Status: DC

## 2017-04-21 NOTE — Telephone Encounter (Signed)
Medication has been refilled.  Message left for patient to return my call.

## 2017-04-21 NOTE — Telephone Encounter (Signed)
Patient stated that he is still SOB. He has been out of the solutions since last week.  He stated that he has contacted Dr Thurston HoleWert's office many times [facility] ipratropium (ATROVENT) 0.02 % nebulizer solution and albuterol (PROVENTIL) (2.5 MG/3ML) 0.083% nebulizer solution. He sent the request there since Dr Sherene SiresWert also prescribed his inhaler. However, he still have not heard back from them. He asked if we can send a temporary supply to until he can get it from Dr Sherene SiresWert.

## 2017-04-21 NOTE — Telephone Encounter (Signed)
PLEASE NOTE: All timestamps contained within this report are represented as [place] Standard Time. CONFIDENTIALTY NOTICE: This fax transmission is intended only for the addressee. It contains information that is legally privileged, confidential or otherwise protected from use or disclosure. If you are not the intended recipient, you are strictly prohibited from reviewing, disclosing, copying using or disseminating any of this information or taking any action in reliance on or regarding this information. If you have received this fax in error, please notify us immediately by telephone so that we can arrange for its return to [place]. Phone: [phone], Toll-Free: [phone], Fax: [phone] Page: 1 of 2 Call Id: [id number] [facility] Night - Client TELEPHONE ADVICE RECORD [facility] Call [facility] Patient Name: Joseph [name] Gender: Male DOB: [birth year] Age: 46 Y 11 M 19 D Return Phone Number: [phone] (Primary) City/State/Zip: [place] Client [facility] Night - Client Client Site [facility] - Night Physician Vernona Rieger - NP Who Is Calling Patient / Member / Family / Caregiver Call Type Triage / Clinical Relationship To Patient Self Return Phone Number [phone] (Primary) Chief Complaint BREATHING - shortness of breath or sounds breathless Reason for Call Symptomatic / Request for Health Information Initial Comment Caller is out of medication for his nebulizer and he has copd and asthma. He needs a refill on albuterol. He states he is having a hard time breathing. Nurse Assessment Nurse: Loletta Specter, RN, Misty Stanley Date/Time Lamount Cohen Time): 04/19/2017 3:46:50 PM Confirm and document reason for call. If symptomatic, describe symptoms. ---Caller states he needs a refill for his medication for his nebulizer. He has COPD and asthma. He is having difficulty breathing at this time. He has been trying to get  medications straightened out for 2 weeks. He transferred doctors and pulmonologist has not handled medications. He has atrovent. Does the PT have any chronic conditions? (i.e. diabetes, asthma, etc.) ---Yes List chronic conditions. ---COPD, asthma, diabetes, multiple surgeries Guidelines Guideline Title Affirmed Question Asthma Attack [1] Severe wheezing or coughing AND [2] doesn't have neb or inhaler available Disp. Time Lamount Cohen Time) Disposition Final User 04/19/2017 3:54:03 PM Go to ED Now Yes Loletta Specter, RN, Misty Stanley Referrals GO TO FACILITY UNDECIDED Care Advice Given Per Guideline Caller is undecided whether he will go to ED or not, he states if he does go it will be to [facility] ED GO TO ED NOW: You need to be seen in the Emergency Department. Go to the ER at ___________ Hospital. Leave now. Drive carefully. NOTE TO TRIAGER - DRIVING: * Another adult should drive. * If immediate transportation is not available via car or taxi, then the patient should be instructed to call [id number]. BRING MEDICINES: * Please bring a list of your current medicines when you go to the Emergency Department (ER). * It is also a good idea to bring the pill bottles too. This will help the doctor to make certain you are taking the right medicines and the right dose. CARE ADVICE given per Asthma Attack (Adult) guideline. PLEASE NOTE: All timestamps contained within this report are represented as [place] Standard Time. CONFIDENTIALTY NOTICE: This fax transmission is intended only for the addressee. It contains information that is legally privileged, confidential or otherwise protected from use or disclosure. If you are not the intended recipient, you are strictly prohibited from reviewing, disclosing, copying using or disseminating any of this information or taking any action in reliance on or regarding this information. If you have received this fax  in error, please notify us immediately by telephone so that we can  arrange for its return to us. Phone: [phone], Toll-Free: [phone], Fax: [phone] Page: 2 of 2 Call Id: [phone]

## 2017-04-21 NOTE — Telephone Encounter (Signed)
Unable to reach pt by phone.

## 2017-04-21 NOTE — Telephone Encounter (Signed)
Can we please call and check on him. Did he get his Albuterol refilled?

## 2017-04-21 NOTE — Telephone Encounter (Signed)
Yes, please refill both of those medications. If he remains SOB, he needs to be evaluated.

## 2017-04-21 NOTE — Addendum Note (Signed)
Addended by: Tawnya CrookSAMBATH, Quintus Premo on: 04/21/2017 02:18 PM   Modules accepted: Orders

## 2017-04-21 NOTE — Telephone Encounter (Signed)
Patient called back and was notified that refills as been sent.

## 2017-04-23 ENCOUNTER — Telehealth: Payer: Self-pay | Admitting: Pulmonary Disease

## 2017-04-23 NOTE — Telephone Encounter (Signed)
We do not currently have any samples of Dulera 200. Pt has been scheduled with SG on 05/07/17 @ 2:00, as this was next available. Pt was offered OV with TP on 04/24/17 and declined, due to having another appointment.  Nothing further needed

## 2017-04-23 NOTE — Telephone Encounter (Signed)
[facility] [place]  [place] and the alternative is [employer]o. Please advise Dr Sherene SiresWert.  Pt is schedule 05/23/17 at 2:45p with MW -- [facility]

## 2017-04-23 NOTE — Telephone Encounter (Signed)
See if we've received any dulera 200 yet and if not then set up with NP asap for training on Breo device

## 2017-04-23 NOTE — Telephone Encounter (Signed)
MW, per pt chart pt has a documented allergy/intolerance to Symbicort.  Pt is scheduled to see MW on 6/22 to discuss med changes. Please advise on how to proceed in the interim.  Thanks!

## 2017-04-23 NOTE — Telephone Encounter (Signed)
Those are entirely different devices  Since hasn't been seen in over a year I shouldn' t be refilling anyway but happy to rec symb 160 samples until returns to regroup in meantime needs to figure out whether symbicort is covered as it is the same drug/device as dulera

## 2017-04-24 DIAGNOSIS — E291 Testicular hypofunction: Secondary | ICD-10-CM | POA: Insufficient documentation

## 2017-04-25 NOTE — Telephone Encounter (Signed)
Opened in error

## 2017-05-01 ENCOUNTER — Other Ambulatory Visit: Payer: Self-pay | Admitting: Primary Care

## 2017-05-01 DIAGNOSIS — G8929 Other chronic pain: Secondary | ICD-10-CM | POA: Diagnosis not present

## 2017-05-01 DIAGNOSIS — E785 Hyperlipidemia, unspecified: Secondary | ICD-10-CM

## 2017-05-01 DIAGNOSIS — M25562 Pain in left knee: Secondary | ICD-10-CM | POA: Diagnosis not present

## 2017-05-01 DIAGNOSIS — E1129 Type 2 diabetes mellitus with other diabetic kidney complication: Secondary | ICD-10-CM

## 2017-05-01 DIAGNOSIS — R809 Proteinuria, unspecified: Secondary | ICD-10-CM

## 2017-05-02 ENCOUNTER — Ambulatory Visit: Payer: Medicare Other | Admitting: Family Medicine

## 2017-05-05 ENCOUNTER — Other Ambulatory Visit: Payer: Self-pay | Admitting: Primary Care

## 2017-05-05 DIAGNOSIS — N3943 Post-void dribbling: Secondary | ICD-10-CM

## 2017-05-05 NOTE — Telephone Encounter (Signed)
Has he seen Urology? Did the Flomax help with symptoms?

## 2017-05-05 NOTE — Telephone Encounter (Signed)
Ok to refill? Electronically refill request for tamsulosin (FLOMAX) 0.4 MG CAPS capsule.  Last prescribed and seen on 04/01/2017.

## 2017-05-05 NOTE — Telephone Encounter (Signed)
Noted, refills sent to pharmacy. 

## 2017-05-05 NOTE — Telephone Encounter (Signed)
Spoken to patient and he stated that he did see urologist. He is having trouble with them right regarding his testerone.   Patient stated that Flomax did really help and would like Jae DireKate to refill the medication.

## 2017-05-06 ENCOUNTER — Ambulatory Visit (INDEPENDENT_AMBULATORY_CARE_PROVIDER_SITE_OTHER): Payer: Medicare Other | Admitting: Orthopedic Surgery

## 2017-05-06 ENCOUNTER — Ambulatory Visit (INDEPENDENT_AMBULATORY_CARE_PROVIDER_SITE_OTHER): Payer: Medicare Other

## 2017-05-06 VITALS — BP 132/91 | HR 94 | Wt 253.0 lb

## 2017-05-06 DIAGNOSIS — M25531 Pain in right wrist: Secondary | ICD-10-CM | POA: Diagnosis not present

## 2017-05-06 DIAGNOSIS — M25462 Effusion, left knee: Secondary | ICD-10-CM | POA: Diagnosis not present

## 2017-05-06 DIAGNOSIS — S56911A Strain of unspecified muscles, fascia and tendons at forearm level, right arm, initial encounter: Secondary | ICD-10-CM

## 2017-05-06 NOTE — Progress Notes (Signed)
Patient ID: Joseph [name], male   DOB: [birth year], 46 y.o.   MRN: [id number]  Chief Complaint  Patient presents with  . 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  Characteristics of the pain new-onset present now for several weeks worse with wrist flexion. Not associated with any weakness no swelling or ecchymosis was noted  He also wants his left knee drained    Review of Systems  Constitutional: Negative for chills, fever and malaise/fatigue.  Skin: Negative for itching and rash.    Past Medical History:  Diagnosis Date  . Anxiety   . Arthritis   . Asthma   . Chronic back pain   . Chronic pain of left knee   . COPD (chronic obstructive pulmonary disease) (HCC)   . Diabetes mellitus   . GERD (gastroesophageal reflux disease)   . Shortness of breath   . Sleep apnea      BP (!) 132/91   Pulse 94   Wt 253 lb (114.8 kg)   BMI 36.30 kg/m  Gen. appearance is normal grooming and hygiene normal Orientation to person place and time normal Mood normal   Ortho Exam Right wrist all tendon function is intact each were tested independently palmaris longus is intact but tender up into the forearm range of motion of the wrist instability wrist is normal pulse and perfusion are normal he has good sensation in the hand skin shows no rash  Left wrist normal range of motion stability and strength  Left knee large effusion, mild tenderness, knee stable neurovascular exam of the limb intact   A/P  Medical decision-making  Encounter Diagnoses  Name Primary?  . Right wrist pain Yes  . Effusion of knee joint, left   . Forearm strain, right, initial encounter      Meds ordered this encounter  Medications  . tapentadol (NUCYNTA) 50 MG tablet    Sig: Take 50 mg by mouth 3 (three) times  daily.     Procedure note injection and aspiration left knee joint  Verbal consent was obtained to aspirate and inject the left knee joint   Timeout was completed to confirm the site of aspiration and injection  An 18-gauge needle was used to aspirate the left knee joint from a suprapatellar lateral approach.  The medications used were 40 mg of Depo-Medrol and 1% lidocaine 3 cc  Anesthesia was provided by ethyl chloride and the skin was prepped with alcohol.  After cleaning the skin with alcohol an 18-gauge needle was used to aspirate the right knee joint.  We obtained 50 cc of fluid, clear yellow   We followed this by injection of 40 mg of Depo-Medrol and 3 cc 1% lidocaine.  There were no complications. A sterile bandage was applied.       Fuller CanadaStanley Harrison, MD 05/06/2017 4:00 PM

## 2017-05-07 ENCOUNTER — Ambulatory Visit (INDEPENDENT_AMBULATORY_CARE_PROVIDER_SITE_OTHER): Payer: Medicare Other | Admitting: Acute Care

## 2017-05-07 ENCOUNTER — Encounter: Payer: Self-pay | Admitting: Primary Care

## 2017-05-07 ENCOUNTER — Encounter: Payer: Self-pay | Admitting: Acute Care

## 2017-05-07 DIAGNOSIS — J449 Chronic obstructive pulmonary disease, unspecified: Secondary | ICD-10-CM | POA: Diagnosis not present

## 2017-05-07 DIAGNOSIS — G4733 Obstructive sleep apnea (adult) (pediatric): Secondary | ICD-10-CM

## 2017-05-07 DIAGNOSIS — F17210 Nicotine dependence, cigarettes, uncomplicated: Secondary | ICD-10-CM | POA: Diagnosis not present

## 2017-05-07 MED ORDER — FLUTICASONE FUROATE-VILANTEROL 200-25 MCG/INH IN AEPB: 1.0000 | INHALATION_SPRAY | Freq: Every day | RESPIRATORY_TRACT | 0 refills | Status: DC

## 2017-05-07 NOTE — Assessment & Plan Note (Signed)
Good control of OSA 97% compliance on CPAP device AHI 0.7 Plan Continue on CPAP at bedtime. You appear to be benefiting from the treatment Goal is to wear for at least 6 hours each night for maximal clinical benefit. Continue to work on weight loss, as the link between excess weight  and sleep apnea is well established.  Do not drive if sleepy. Follow up with Dr. Sherene SiresWert In 2 weeks as is scheduled to assure you are doing well. Please contact office for sooner follow up if symptoms do not improve or worsen or seek emergency care

## 2017-05-07 NOTE — Progress Notes (Signed)
History of Present Illness Joseph [name] is a 46 y.o. male current smoker with OSA and COPD. He is followed by Dr. Sherene Sires.   05/07/2017 OV for medication management: Patient presents today for medication management. While he is managed well on his Elwin Sleight, his insurance company longer covers this medication.Per Dr. Sherene Sires he would like patient to  start a therapeutic trial of Breo. Patient states his COPD is currently well controlled on his Dulera. At baseline he has sputum production in the morning, which clears. He is continuing to smoke. I have counseled him on the necessity of smoking cessation and the risks of continued tobacco abuse. He denies fever, chest pain, orthopnea, or hemoptysis. Patient is currently compliant with his CPAP every night. Per patient, he was told to keep appointment with Dr. Sherene Sires in 2 weeks to evaluate response to [facility].  Test Results: Compliance download 04/07/2017 to 05/06/2017 Air since 10 CPAP Set pressure of 10 cm of water EPR level 2 Compliance 29 of 30 days or 97% Greater than 4 hours equals 29 days or 97% Less than 4 hours equals 0 days Average usage 8 hours and 6 minutes AHI 0.7  I have spent 3 minutes counseling patient on smoking cessation and health risks with continued tobacco abuse this visit.   CBC Latest Ref Rng & Units 03/17/2017 12/19/2016 12/01/2015  WBC 4.0 - 10.5 K/uL 8.4 - 12.2(H)  Hemoglobin 13.0 - 17.0 g/dL 16.1 09.6 04.5  Hematocrit 39.0 - 52.0 % 41.1 40.0 48.6  Platelets 150 - 400 K/uL 214 - 228    BMP Latest Ref Rng & Units 03/17/2017 12/19/2016 08/02/2016  Glucose 65 - 99 mg/dL 409(W) 119(J) 478(G)  BUN 6 - 20 mg/dL 8 8 6   Creatinine 0.61 - 1.24 mg/dL 9.56 2.13 0.86  BUN/Creat Ratio 9 - 20 - - 7(L)  Sodium 135 - 145 mmol/L 134(L) 136 139  Potassium 3.5 - 5.1 mmol/L 3.3(L) 4.3 4.7  Chloride 101 - 111 mmol/L 101 98(L) 97  CO2 22 - 32 mmol/L 23 - 25  Calcium 8.9 - 10.3 mg/dL 8.9 - 9.8    PFT    Component Value Date/Time   FEV1PRE  2.71 10/10/2015 1235   FEV1POST 3.00 10/10/2015 1235   FVCPRE 3.16 10/10/2015 1235   FVCPOST 3.57 10/10/2015 1235   TLC 5.96 03/08/2015 1315   DLCOUNC 27.14 03/08/2015 1315   PREFEV1FVCRT 86 10/10/2015 1235   PSTFEV1FVCRT 84 10/10/2015 1235    Dg Wrist Complete Right  Result Date: 05/06/2017 Radiology reading dictated by Dr. Romeo Apple Radiology report x-rays of the  Right  wrist Chief complaint pain in wrist AP and lateral and oblique x-rays of the wrist Findings: The wrist x-ray showed no evidence of abnormality. The alignment is normal. The joint spaces are preserved. There no signs of arthritis or joint space narrowing. No evidence of osteophyte formation. There are no bone tumors or soft tissue swelling Impression normal wrist x-rays    Past medical hx Past Medical History:  Diagnosis Date  . Anxiety   . Arthritis   . Asthma   . Chronic back pain   . Chronic pain of left knee   . COPD (chronic obstructive pulmonary disease) (HCC)   . Diabetes mellitus   . GERD (gastroesophageal reflux disease)   . Shortness of breath   . Sleep apnea      Social History  Substance Use Topics  . Smoking status: Current Every Day Smoker    Packs/day: 1.50  Years: 20.00    Types: Cigarettes    Start date: 12/17/1993  . Smokeless tobacco: Current User  . Alcohol use No    Tobacco Cessation: Ready to quit: No Counseling given: Yes   Past surgical hx, Family hx, Social hx all reviewed.  Current Outpatient Prescriptions on File Prior to Visit  Medication Sig  . albuterol (PROVENTIL) (2.5 MG/3ML) 0.083% nebulizer solution USE 1 VIAL IN NEBULIZER FOUR TIMES DAILY AS NEEDED FOR SHORTNESS OF BREATH  . ALPRAZolam (XANAX) 1 MG tablet TAKE ONE TABLET TWICE DAILY AS NEEDED FOR ANXIETY (Patient taking differently: TAKE ONE TABLET TWICE DAILY)  . buPROPion (WELLBUTRIN SR) 150 MG 12 hr tablet TAKE ONE TABLET DAILY FOR THREE DAYS, THEN 1 TABLET TWICE DAILY. (Patient taking differently: Take 150 mg  by mouth 2 (two) times daily. )  . CINNAMON PO Take 2,800 mg by mouth daily. EACH TABLET IS 1400 MG.  TAKES 2 CAPSULES DAILY  . citalopram (CELEXA) 40 MG tablet TAKE ONE (1) TABLET BY MOUTH EVERY DAY  . diphenhydrAMINE (BENADRYL) 25 MG tablet Take 25 mg by mouth 2 (two) times daily.  . DULERA 200-5 MCG/ACT AERO USE TWO PUFFS FIRST THING IN THE MORNINGAND THEN ANOTHER TWO PUFFS ABOUT 12 HOURS LATER  . empagliflozin (JARDIANCE) 10 MG TABS tablet Take 10 mg by mouth every morning.  Marland Kitchen glipiZIDE (GLUCOTROL) 5 MG tablet Take 2 tabs bid (Patient taking differently: Take 10 mg by mouth 2 (two) times daily before a meal. )  . ipratropium (ATROVENT) 0.02 % nebulizer solution USE 1 VIAL IN NEBULIZER FOUR TIMES DAILY AS NEEDED  . lansoprazole (PREVACID) 30 MG capsule TAKE ONE CAPSULE BY MOUTH TWICE A DAY BEFORE A MEAL (Patient taking differently: Take 30 mg by mouth 2 (two) times daily before a meal. BEFORE A MEAL)  . Multiple Vitamin (MULTIVITAMIN WITH MINERALS) TABS Take 1 tablet by mouth every morning. Men's once daily multivitamin packet (vitamin e, calcium, ginseng)  . mupirocin ointment (BACTROBAN) 2 % Place 1 application into the nose 2 (two) times daily.  . nabumetone (RELAFEN) 500 MG tablet Take 1 tablet (500 mg total) by mouth 2 (two) times daily.  . Saw Palmetto, Serenoa repens, (SAW PALMETTO PO) Take 1 capsule by mouth daily.  . sildenafil (REVATIO) 20 MG tablet TAKE 2-3 TABLETS AS NEEDED (Patient taking differently: TAKE 3 TABLETS AS NEEDED)  . tamsulosin (FLOMAX) 0.4 MG CAPS capsule TAKE ONE (1) CAPSULE EACH DAY  . tapentadol (NUCYNTA) 50 MG tablet Take 50 mg by mouth 3 (three) times daily.  . traZODone (DESYREL) 50 MG tablet Take 1-2 tablets (50-100 mg total) by mouth at bedtime as needed for sleep.  Marland Kitchen triamcinolone cream (KENALOG) 0.1 % Apply 1 application topically 2 (two) times daily.   No current facility-administered medications on file prior to visit.      Allergies  Allergen  Reactions  . Azithromycin Hives  . Erythromycin Hives  . Keflex [Cephalexin] Nausea Only  . Metformin Nausea And Vomiting  . Symbicort [Budesonide-Formoterol Fumarate] Other (See Comments)    States that 3 doses were used and breathing became worse    Review Of Systems:  Constitutional:   No  weight loss, night sweats,  Fevers, chills, fatigue, or  lassitude.  HEENT:   No headaches,  Difficulty swallowing,  Tooth/dental problems, or  Sore throat,                No sneezing, itching, ear ache, nasal congestion, post nasal drip,  CV:  No chest pain,  Orthopnea, PND, swelling in lower extremities, anasarca, dizziness, palpitations, syncope.   GI  No heartburn, indigestion, abdominal pain, nausea, vomiting, diarrhea, change in bowel habits, loss of appetite, bloody stools.   Resp: + shortness of breath with exertion not  at rest. + excess mucus, + productive cough,  No non-productive cough,  No coughing up of blood.  No change in color of mucus.  No wheezing.  No chest wall deformity  Skin: no rash or lesions.  GU: no dysuria, change in color of urine, no urgency or frequency.  No flank pain, no hematuria   MS:  No joint pain or swelling.  No decreased range of motion.  No back pain.  Psych:  No change in mood or affect. No depression or anxiety.  No memory loss.   Vital Signs BP 118/80 (BP Location: Right Arm, Cuff Size: Normal)   Pulse (!) 101   Ht 5\' 10"  (1.778 m)   Wt 251 lb 6.4 oz (114 kg)   SpO2 98%   BMI 36.07 kg/m    Physical Exam:  General- No distress,  A&Ox3, pleasant ENT: No sinus tenderness, TM clear, pale nasal mucosa, no oral exudate,no post nasal drip, no LAN Cardiac: S1, S2, regular rate and rhythm, no murmur Chest: No wheeze/ rales/ dullness; no accessory muscle use, no nasal flaring, no sternal retractions, diminished per bases Abd.: Soft Non-tender, non distended, bowel sounds positive  Ext: No clubbing cyanosis, edema Neuro:  normal strength Skin:  No rashes, warm and dry Psych: normal mood and behavior   Assessment/Plan  COPD without exacerbation (HCC) Stable interval regarding COPD Need for medication management due to insurance coverage change on maintenance Dulera Plan We will give you samples of BREO today. Use this 1 puff once daily Rinse mouth after use. We will have the nurses teach you how to use it. Use the Breo in place of [place]. If you have good results, please call us so we can send in a prescription. If you do not have good results, please let us know so we can let you insurance company know you have failed Breo . Follow up with Dr. Sherene SiresWert In 2 weeks as is scheduled to assure you are doing well. Please contact office for sooner follow up if symptoms do not improve or worsen or seek emergency care    OSA (obstructive sleep apnea) Good control of OSA 97% compliance on CPAP device AHI 0.7 Plan Continue on CPAP at bedtime. You appear to be benefiting from the treatment Goal is to wear for at least 6 hours each night for maximal clinical benefit. Continue to work on weight loss, as the link between excess weight  and sleep apnea is well established.  Do not drive if sleepy. Follow up with Dr. Sherene SiresWert In 2 weeks as is scheduled to assure you are doing well. Please contact office for sooner follow up if symptoms do not improve or worsen or seek emergency care      Bevelyn NgoSarah F Kortlynn Poust, NP 05/07/2017  7:22 PM

## 2017-05-07 NOTE — Patient Instructions (Addendum)
It is nice to meet you today. We will give you samples of BREO today. Use this 1 puff once daily Rinse mouth after use. We will have the nurses teach you how to use it. Use the Breo in place of Dulera. If you have gooMershond results, please call us so we can send in a prescription. If you do not have good results, please let us know so we can let you insurance company know you have failed Breo . Continue on CPAP at bedtime. You appear to be benefiting from the treatment Goal is to wear for at least 6 hours each night for maximal clinical benefit. Continue to work on weight loss, as the link between excess weight  and sleep apnea is well established.  Do not drive if sleepy. Follow up with Dr. Sherene SiresWert In 2 weeks as is scheduled to assure you are doing well. Please contact office for sooner follow up if symptoms do not improve or worsen or seek emergency care

## 2017-05-07 NOTE — Assessment & Plan Note (Signed)
Stable interval regarding COPD Need for medication management due to insurance coverage change on maintenance Dulera Plan We will give you samples of BREO today. Use this 1 puff once daily Rinse mouth after use. We will have the nurses teach you how to use it. Use the Breo in place of [place]. If you have good results, please call us so we can send in a prescription. If you do not have good results, please let us know so we can let you insurance company know you have failed Breo . Follow up with Dr. Sherene SiresWert In 2 weeks as is scheduled to assure you are doing well. Please contact office for sooner follow up if symptoms do not improve or worsen or seek emergency care

## 2017-05-08 ENCOUNTER — Other Ambulatory Visit (INDEPENDENT_AMBULATORY_CARE_PROVIDER_SITE_OTHER): Payer: Medicare Other

## 2017-05-08 DIAGNOSIS — E785 Hyperlipidemia, unspecified: Secondary | ICD-10-CM

## 2017-05-08 DIAGNOSIS — E1129 Type 2 diabetes mellitus with other diabetic kidney complication: Secondary | ICD-10-CM

## 2017-05-08 DIAGNOSIS — R809 Proteinuria, unspecified: Secondary | ICD-10-CM | POA: Diagnosis not present

## 2017-05-08 LAB — LIPID PANEL
CHOL/HDL RATIO: 6
CHOLESTEROL: 222 mg/dL — AB (ref 0–200)
HDL: 35.3 mg/dL — ABNORMAL LOW (ref >39.00)
NonHDL: 186.64
Triglycerides: 381 mg/dL — ABNORMAL HIGH (ref 0.0–149.0)
VLDL: 76.2 mg/dL — AB (ref 0.0–40.0)

## 2017-05-08 LAB — COMPREHENSIVE METABOLIC PANEL
ALBUMIN: 4.3 g/dL (ref 3.5–5.2)
ALT: 19 U/L (ref 0–53)
AST: 10 U/L (ref 0–37)
Alkaline Phosphatase: 66 U/L (ref 39–117)
BUN: 10 mg/dL (ref 6–23)
CHLORIDE: 102 meq/L (ref 96–112)
CO2: 29 mEq/L (ref 19–32)
Calcium: 9.4 mg/dL (ref 8.4–10.5)
Creatinine, Ser: 0.94 mg/dL (ref 0.40–1.50)
GFR: 91.42 mL/min (ref >60.00)
Glucose, Bld: 208 mg/dL — ABNORMAL HIGH (ref 70–99)
POTASSIUM: 3.5 meq/L (ref 3.5–5.1)
SODIUM: 140 meq/L (ref 135–145)
Total Bilirubin: 0.5 mg/dL (ref 0.2–1.2)
Total Protein: 7 g/dL (ref 6.0–8.3)

## 2017-05-08 LAB — MICROALBUMIN / CREATININE URINE RATIO
Creatinine,U: 134.6 mg/dL
MICROALB/CREAT RATIO: 0.6 mg/g (ref 0.0–30.0)
Microalb, Ur: 0.8 mg/dL (ref 0.0–1.9)

## 2017-05-08 LAB — LDL CHOLESTEROL, DIRECT: LDL DIRECT: 150 mg/dL

## 2017-05-08 LAB — HEMOGLOBIN A1C: HEMOGLOBIN A1C: 9.5 % — AB (ref 4.6–6.5)

## 2017-05-08 NOTE — Progress Notes (Signed)
Chart and office note reviewed in detail  > agree with a/p as outlined    

## 2017-05-09 NOTE — Addendum Note (Signed)
Addendum  created 05/09/17 0924 by Corynn Solberg, MD   Sign clinical note    

## 2017-05-12 ENCOUNTER — Encounter: Payer: Self-pay | Admitting: Primary Care

## 2017-05-12 ENCOUNTER — Ambulatory Visit (INDEPENDENT_AMBULATORY_CARE_PROVIDER_SITE_OTHER): Payer: Medicare Other | Admitting: Primary Care

## 2017-05-12 VITALS — BP 122/82 | HR 106 | Temp 98.1°F | Ht 70.0 in | Wt 246.8 lb

## 2017-05-12 DIAGNOSIS — E349 Endocrine disorder, unspecified: Secondary | ICD-10-CM | POA: Diagnosis not present

## 2017-05-12 DIAGNOSIS — E119 Type 2 diabetes mellitus without complications: Secondary | ICD-10-CM

## 2017-05-12 DIAGNOSIS — N3943 Post-void dribbling: Secondary | ICD-10-CM | POA: Diagnosis not present

## 2017-05-12 DIAGNOSIS — E785 Hyperlipidemia, unspecified: Secondary | ICD-10-CM | POA: Insufficient documentation

## 2017-05-12 MED ORDER — ATORVASTATIN CALCIUM 20 MG PO TABS: 20.0000 mg | ORAL_TABLET | Freq: Every evening | ORAL | 3 refills | Status: DC

## 2017-05-12 MED ORDER — METFORMIN HCL ER 500 MG PO TB24: ORAL_TABLET | ORAL | 1 refills | Status: DC

## 2017-05-12 NOTE — Assessment & Plan Note (Signed)
Energy much improved since starting treatment with Urology.

## 2017-05-12 NOTE — Assessment & Plan Note (Signed)
Much improved with Flomax.

## 2017-05-12 NOTE — Assessment & Plan Note (Signed)
Discussed the importance of a healthy diet and regular exercise in order for weight loss, and to reduce the risk of other medical problems.  

## 2017-05-12 NOTE — Patient Instructions (Signed)
Start Metformin ER 500 mg tablets. Take 1 tablet by mouth every morning with breakfast for 2 weeks, then increase to two tablets once daily with breakfast.  Continue Jardiance and Glipizide.  Start atorvastatin 20 mg tablets for high cholesterol. Take 1 tablet by mouth every night at bedtime.  It's important to improve your diet by reducing consumption of fast food, fried food, processed snack foods, sugary drinks. Increase consumption of fresh vegetables and fruits, whole grains, water.  Ensure you are drinking 64 ounces of water daily.  Start exercising. You should be getting 150 minutes of moderate intensity exercise weekly.  You will be contacted regarding your referral to the diabetes nutritionist.  Please let us know if you have not heard back within one week.   Schedule a lab visit three months from now (come fasting), and then an office visit several days after for follow up. The lab visit must be three months from now.  It was a pleasure to see you today!  Diabetes Mellitus and Food It is important for you to manage your blood sugar (glucose) level. Your blood glucose level can be greatly affected by what you eat. Eating healthier foods in the appropriate amounts throughout the day at about the same time each day will help you control your blood glucose level. It can also help slow or prevent worsening of your diabetes mellitus. Healthy eating may even help you improve the level of your blood pressure and reach or maintain a healthy weight. General recommendations for healthful eating and cooking habits include:  Eating meals and snacks regularly. Avoid going long periods of time without eating to lose weight.  Eating a diet that consists mainly of plant-based foods, such as fruits, vegetables, nuts, legumes, and whole grains.  Using low-heat cooking methods, such as baking, instead of high-heat cooking methods, such as deep frying.  Work with your dietitian to make sure you  understand how to use the Nutrition Facts information on food labels. How can food affect me? Carbohydrates Carbohydrates affect your blood glucose level more than any other type of food. Your dietitian will help you determine how many carbohydrates to eat at each meal and teach you how to count carbohydrates. Counting carbohydrates is important to keep your blood glucose at a healthy level, especially if you are using insulin or taking certain medicines for diabetes mellitus. Alcohol Alcohol can cause sudden decreases in blood glucose (hypoglycemia), especially if you use insulin or take certain medicines for diabetes mellitus. Hypoglycemia can be a life-threatening condition. Symptoms of hypoglycemia (sleepiness, dizziness, and disorientation) are similar to symptoms of having too much alcohol. If your health care provider has given you approval to drink alcohol, do so in moderation and use the following guidelines:  Women should not have more than one drink per day, and men should not have more than two drinks per day. One drink is equal to: ? 12 oz of beer. ? 5 oz of wine. ? 1 oz of hard liquor.  Do not drink on an empty stomach.  Keep yourself hydrated. Have water, diet soda, or unsweetened iced tea.  Regular soda, juice, and other mixers might contain a lot of carbohydrates and should be counted.  What foods are not recommended? As you make food choices, it is important to remember that all foods are not the same. Some foods have fewer nutrients per serving than other foods, even though they might have the same number of calories or carbohydrates. It is difficult to  get your body what it needs when you eat foods with fewer nutrients. Examples of foods that you should avoid that are high in calories and carbohydrates but low in nutrients include:  Trans fats (most processed foods list trans fats on the Nutrition Facts label).  Regular soda.  Juice.  Candy.  Sweets, such as cake,  pie, doughnuts, and cookies.  Fried foods.  What foods can I eat? Eat nutrient-rich foods, which will nourish your body and keep you healthy. The food you should eat also will depend on several factors, including:  The calories you need.  The medicines you take.  Your weight.  Your blood glucose level.  Your blood pressure level.  Your cholesterol level.  You should eat a variety of foods, including:  Protein. ? Lean cuts of meat. ? Proteins low in saturated fats, such as fish, egg whites, and beans. Avoid processed meats.  Fruits and vegetables. ? Fruits and vegetables that may help control blood glucose levels, such as apples, mangoes, and yams.  Dairy products. ? Choose fat-free or low-fat dairy products, such as milk, yogurt, and cheese.  Grains, bread, pasta, and rice. ? Choose whole grain products, such as multigrain bread, whole oats, and brown rice. These foods may help control blood pressure.  Fats. ? Foods containing healthful fats, such as nuts, avocado, olive oil, canola oil, and fish.  Does everyone with diabetes mellitus have the same meal plan? Because every person with diabetes mellitus is different, there is not one meal plan that works for everyone. It is very important that you meet with a dietitian who will help you create a meal plan that is just right for you. This information is not intended to replace advice given to you by your health care provider. Make sure you discuss any questions you have with your health care provider. Document Released: 08/15/2005 Document Revised: 04/25/2016 Document Reviewed: 10/15/2013 Elsevier Interactive Patient Education  2017 [employer].

## 2017-05-12 NOTE — Progress Notes (Signed)
Subjective:    Patient ID: Joseph [name], male    DOB: 13-Feb-1971, 46 y.o.   MRN: [id number]  HPI  Mr. Tramell is a 46 year old male who presents today for follow up.  1) Type 2 Diabetes: Currently managed on Glipizide 10 mg BID and Jardiance 10 mg. His recent A1C is 9.5 which is an increase from 9.2 on prior labs. He cannot tolerate Metformin as it causes nausea and vomiting, but he's not tried this in years.  Diet currently consists of:  Breakfast: Glucerna Lunch: Skips Dinner: Fast food, restaurants (burgers, french fries) Snacks: None Desserts: Occasionally Beverages: Cherry coke (currently drinking 12 cans daily), some water  Exercise: He is not currently exercising.    2) Hyperlipidemia: Currently not managed on statin therapy. His recent TC is 222, LDL of 150, Trigs of 381. He endorses a poor diet and is not exercising.  The 10-year ASCVD risk score Denman George DC Montez Hageman., et al., 2013) is: 18.5%   Values used to calculate the score:     Age: 37 years     Sex: Male     Is Non-Hispanic African American: No     Diabetic: Yes     Tobacco smoker: Yes     Systolic Blood Pressure: 122 mmHg     Is BP treated: No     HDL Cholesterol: 35.3 mg/dL     Total Cholesterol: 222 mg/dL   Review of Systems  Eyes: Negative for visual disturbance.  Respiratory: Negative for shortness of breath.   Cardiovascular: Negative for chest pain.  Neurological: Negative for dizziness, numbness and headaches.       Past Medical History:  Diagnosis Date  . Anxiety   . Arthritis   . Asthma   . Chronic back pain   . Chronic pain of left knee   . COPD (chronic obstructive pulmonary disease) (HCC)   . Diabetes mellitus   . GERD (gastroesophageal reflux disease)   . Shortness of breath   . Sleep apnea      Social History   Social History  . Marital status: Legally Separated    Spouse name: N/A  . Number of children: N/A  . Years of education: N/A   Occupational History  . disability     Social History Main Topics  . Smoking status: Current Every Day Smoker    Packs/day: 1.50    Years: 20.00    Types: Cigarettes    Start date: 12/17/1993  . Smokeless tobacco: Current User  . Alcohol use No  . Drug use: No  . Sexual activity: Yes    Birth control/ protection: Surgical   Other Topics Concern  . Not on file   Social History Narrative  . No narrative on file    Past Surgical History:  Procedure Laterality Date  . CARPAL TUNNEL RELEASE Left   . CATARACT EXTRACTION W/PHACO Right 03/11/2016   Procedure: CATARACT EXTRACTION PHACO AND INTRAOCULAR LENS PLACEMENT (IOC);  Surgeon: Gemma Payor, MD;  Location: [facility];  Service: Ophthalmology;  Laterality: Right;  CDE 4.24  . HERNIA REPAIR Bilateral   . KNEE ARTHROSCOPY WITH MEDIAL MENISECTOMY Left 04/22/2014   Procedure: LEFT KNEE ARTHROSCOPY WITH MEDIAL MENISECTOMY;  Surgeon: Vickki Hearing, MD;  Location: [facility];  Service: Orthopedics;  Laterality: Left;  . KNEE ARTHROSCOPY WITH MEDIAL MENISECTOMY Right 12/06/2015   Procedure: RIGHT KNEE ARTHROSCOPY WITH MEDIAL MENISECTOMY;  Surgeon: Vickki Hearing, MD;  Location: [facility];  Service: Orthopedics;  Laterality: Right;  . KNEE ARTHROSCOPY WITH MEDIAL MENISECTOMY Left 03/20/2017   Procedure: KNEE ARTHROSCOPY WITH MEDIAL MENISECTOMY;  Surgeon: Vickki HearingStanley E Harrison, MD;  Location: [facility];  Service: Orthopedics;  Laterality: Left;  . ORIF WRIST FRACTURE Left 12/19/2016   Procedure: OPEN REDUCTION INTERNAL FIXATION (ORIF) WRIST FRACTURE;  Surgeon: Betha LoaKevin Kuzma, MD;  Location: [facility];  Service: Orthopedics;  Laterality: Left;  accumed   . SHOULDER ARTHROCENTESIS Right   . VASECTOMY      Family History  Problem Relation Age of Onset  . Emphysema Maternal Grandfather   . Emphysema Maternal Grandmother   . Clotting disorder Maternal Grandmother     Allergies  Allergen Reactions  . Azithromycin Hives  . Erythromycin Hives  . Keflex [Cephalexin] Nausea Only   . Metformin Nausea And Vomiting  . Symbicort [Budesonide-Formoterol Fumarate] Other (See Comments)    States that 3 doses were used and breathing became worse    Current Outpatient Prescriptions on File Prior to Visit  Medication Sig Dispense Refill  . albuterol (PROVENTIL) (2.5 MG/3ML) 0.083% nebulizer solution USE 1 VIAL IN NEBULIZER FOUR TIMES DAILY AS NEEDED FOR SHORTNESS OF BREATH 360 mL 0  . ALPRAZolam (XANAX) 1 MG tablet TAKE ONE TABLET TWICE DAILY AS NEEDED FOR ANXIETY (Patient taking differently: TAKE ONE TABLET TWICE DAILY) 60 tablet 5  . buPROPion (WELLBUTRIN SR) 150 MG 12 hr tablet TAKE ONE TABLET DAILY FOR THREE DAYS, THEN 1 TABLET TWICE DAILY. (Patient taking differently: Take 150 mg by mouth 2 (two) times daily. ) 60 tablet 5  . CINNAMON PO Take 2,800 mg by mouth daily. EACH TABLET IS 1400 MG.  TAKES 2 CAPSULES DAILY    . citalopram (CELEXA) 40 MG tablet TAKE ONE (1) TABLET BY MOUTH EVERY DAY 30 tablet 11  . diphenhydrAMINE (BENADRYL) 25 MG tablet Take 25 mg by mouth 2 (two) times daily.    . DULERA 200-5 MCG/ACT AERO USE TWO PUFFS FIRST THING IN THE MORNINGAND THEN ANOTHER TWO PUFFS ABOUT 12 HOURS LATER 13 g 3  . empagliflozin (JARDIANCE) 10 MG TABS tablet Take 10 mg by mouth every morning. 30 tablet 5  . fluticasone furoate-vilanterol (BREO ELLIPTA) 200-25 MCG/INH AEPB Inhale 1 puff into the lungs daily. 1 each 0  . glipiZIDE (GLUCOTROL) 5 MG tablet Take 2 tabs bid (Patient taking differently: Take 10 mg by mouth 2 (two) times daily before a meal. ) 360 tablet 1  . ipratropium (ATROVENT) 0.02 % nebulizer solution USE 1 VIAL IN NEBULIZER FOUR TIMES DAILY AS NEEDED 300 mL 0  . lansoprazole (PREVACID) 30 MG capsule TAKE ONE CAPSULE BY MOUTH TWICE A DAY BEFORE A MEAL (Patient taking differently: Take 30 mg by mouth 2 (two) times daily before a meal. BEFORE A MEAL) 60 capsule 5  . Multiple Vitamin (MULTIVITAMIN WITH MINERALS) TABS Take 1 tablet by mouth every morning. Men's once  daily multivitamin packet (vitamin e, calcium, ginseng)    . mupirocin ointment (BACTROBAN) 2 % Place 1 application into the nose 2 (two) times daily. 22 g 0  . nabumetone (RELAFEN) 500 MG tablet Take 1 tablet (500 mg total) by mouth 2 (two) times daily. 60 tablet 5  . Saw Palmetto, Serenoa repens, (SAW PALMETTO PO) Take 1 capsule by mouth daily.    . sildenafil (REVATIO) 20 MG tablet TAKE 2-3 TABLETS AS NEEDED (Patient taking differently: TAKE 3 TABLETS AS NEEDED) 20 tablet 11  . tamsulosin (FLOMAX) 0.4 MG CAPS capsule TAKE ONE (1) CAPSULE  EACH DAY 90 capsule 1  . tapentadol (NUCYNTA) 50 MG tablet Take 50 mg by mouth 3 (three) times daily.    . traZODone (DESYREL) 50 MG tablet Take 1-2 tablets (50-100 mg total) by mouth at bedtime as needed for sleep. 90 tablet 1  . triamcinolone cream (KENALOG) 0.1 % Apply 1 application topically 2 (two) times daily. 45 g 0   No current facility-administered medications on file prior to visit.     BP 122/82   Pulse (!) 106   Temp 98.1 F (36.7 C) (Oral)   Ht 5\' 10"  (1.778 m)   Wt 246 lb 12.8 oz (111.9 kg)   SpO2 98%   BMI 35.41 kg/m    Objective:   Physical Exam  Constitutional: He appears well-nourished.  Neck: Neck supple.  Cardiovascular: Normal rate and regular rhythm.   Pulmonary/Chest: Effort normal and breath sounds normal.  Skin: Skin is warm and dry.          Assessment & Plan:

## 2017-05-12 NOTE — Assessment & Plan Note (Signed)
Increase in A1C to 9.5. Long discussion today regarding the need for improvements in diet and to start exercising. He will make this a priority. Will add in Metformin XR to prevent GI symptoms. Continue Jardiance and Glipziide. Repeat A1C in three months. If no improvement with diet and Metformin then will consider Lantus.

## 2017-05-12 NOTE — Assessment & Plan Note (Signed)
Lipids above goal. Start atorvastatin 20 mg. Work on diet and start exercising. Repeat lipids and LFT's in three months.  The 10-year ASCVD risk score Denman George(Goff DC Montez HagemanJr., et al., 2013) is: 18.5%   Values used to calculate the score:     Age: [age] years     Sex: Male     Is Non-Hispanic African American: No     Diabetic: Yes     Tobacco smoker: Yes     Systolic Blood Pressure: 122 mmHg     Is BP treated: No     HDL Cholesterol: 35.3 mg/dL     Total Cholesterol: 222 mg/dL

## 2017-05-22 ENCOUNTER — Encounter: Payer: Self-pay | Admitting: Primary Care

## 2017-05-23 ENCOUNTER — Encounter: Payer: Self-pay | Admitting: Internal Medicine

## 2017-05-23 ENCOUNTER — Ambulatory Visit (INDEPENDENT_AMBULATORY_CARE_PROVIDER_SITE_OTHER): Payer: Medicare Other | Admitting: Internal Medicine

## 2017-05-23 VITALS — BP 142/80 | HR 113 | Ht 70.0 in | Wt 245.0 lb

## 2017-05-23 DIAGNOSIS — J45909 Unspecified asthma, uncomplicated: Secondary | ICD-10-CM

## 2017-05-23 DIAGNOSIS — F17210 Nicotine dependence, cigarettes, uncomplicated: Secondary | ICD-10-CM

## 2017-05-23 MED ORDER — FLUTICASONE FUROATE-VILANTEROL 200-25 MCG/INH IN AEPB: 1.0000 | INHALATION_SPRAY | Freq: Every day | RESPIRATORY_TRACT | 0 refills | Status: DC

## 2017-05-23 MED ORDER — FLUTICASONE FUROATE-VILANTEROL 200-25 MCG/INH IN AEPB: 1.0000 | INHALATION_SPRAY | Freq: Every day | RESPIRATORY_TRACT | 11 refills | Status: DC

## 2017-05-23 MED ORDER — IPRATROPIUM-ALBUTEROL 20-100 MCG/ACT IN AERS: 1.0000 | INHALATION_SPRAY | Freq: Four times a day (QID) | RESPIRATORY_TRACT | 2 refills | Status: DC | PRN

## 2017-05-23 NOTE — Patient Instructions (Signed)
Plan A = Automatic = BREO 200 one each am   Work on inhaler technique:  relax and gently blow all the way out then take a nice smooth deep breath back in,   Hold for up to 5 seconds if you can.       Plan B = Backup Only use your  combivent respimat  as a rescue medication to be used if you can't catch your breath by resting or doing a relaxed purse lip breathing pattern.  - The less you use it, the better it will work when you need it. - Ok to use the inhaler up to 1 puffs  every 4  hours if you must but call for appointment if use goes up over your usual need - Don't leave home without it !!  (think of it like the spare tire for your car)   Plan C = Crisis - only use your albuterol nebulizer if you first try Plan B and it fails to help > ok to use the nebulizer up to every 4 hours but if start needing it regularly call for immediate appointment   Please schedule a follow up visit in 12 months but call sooner if needed

## 2017-05-23 NOTE — Telephone Encounter (Signed)
See My Chart message. Can you help? Thanks!

## 2017-05-23 NOTE — Progress Notes (Signed)
Subjective:   Patient ID: Joseph [name], male    DOB: 04/05/1971   MRN: [id number]    Brief patient profile:  55  yowm active smoker without significant airflow obst by PFTs 06/02/12 and 03/08/2015    History of Present Illness  IOV 04/10/12   In April 2013 cc  had pneumonia apparently. Went to [facility] ER and found some abnormality (nodule based on review of ER notes) on CXR that resulted in CT Angio 03/07/12 same visit. PE ruled out but found to have mediastinal nodes but the cxr nodule was not there. Apparently ER doctor said it was lymphoma and asked to see an oncologist. But primary care doctor felt that was over zealous and referred patient here. He prefers conservative mgmt to nodes if clinical suspicion for cancer is low  cc lack of energy (spent most of days yesterday in bed), esp in pollen, chronic dyspnea on exertion for 180 feet relieved by rest  But slowly progressive since 2002 for which he uses albuterol prn on average 4 times a day.  Walkt test 185 feet x 3 laps: no desaturation  He smokes at baseline; trying quit, prior intolerance (dreams) with chantix.   Only baseline mild chronic cough with associtaed wheeze that is occasional.. Does have some B symptoms like nocturnal diaphoresis (soaks pillows). Denies weight loss.   CT ANGIOGRAPHY CHEST  Comparison: 03/07/2012  An abnormal right lower paratracheal node has a short axis diameter  of 1.6 cm. An abnormal AP window lymph node has a short axis  diameter of 1.4 cm. Mild bilateral hilar and infrahilar adenopathy  noted. Scattered small axillary lymph nodes are present.  A subcarinal node has a short axis diameter of 1.5 cm.     OV 08/14/2012 Mediastinal nodes: PET scan 06/10/12 shows very low uptake in the mediastinal nodes. Suspicion for cancer is very low. Lymph nodes include 13 mm right lower paratracheal lymph node and 10 mm prevascular lymph node. ACE level 04/10/12  - is in 20s and normal  Still dyspneic: 1 flught of  steps and better with rest and with albuterol and atrovent prn. Dyspne also worse in winter and cooler weathers. Rates it as moderate. STable since last visit to slightly worse. thre is some associtaed cough. No asociated weight loss. COugh is moderate and worst early in morning and in cooler temperatues.. Denies sinus drainage. PFts show mixed picture - fvc 3.4L/69%, fev1 2.9/76%, Ratio 85 but 12% bd respose. TLC 5.7/83%. DLCO 23.7/69% rec rec #MEdiastinal node  - suspicion for cancer is low  - you will need CT chest in a year; we will schedule it later  #Smoking  - glad you are working on quitting  #Shortness of breath - this is due to asthma/copd and weight  - please start QVAR 2 puff twice daily - take sample and show technique and take prescription too  - focus on weight loss through the low glycemic diet; take diet sheet from [place]  #FOllowup  - 3 months with spirometry at followup  - flu shot and pneumovax through PMD as discussed   12/17/2013  acute ov/Joseph [name] still active smoker re: recurrent pattern of coughing x a decade intermittent / out of control since nov 2014/ can't take symbicort makes it worse / on percocet one tid at baseline  Chief Complaint  Patient presents with  . Acute Visit    MR pt.  C/o mostly nonprod cough with some thick white mucous, coughing until he almost passes  out.  Micah Flesher to [employer] last week.    duoneb daily  Sometime skips the doses later in the day but always uses the am dose, sputum to purulent. Mostly sob with cough/ otherwise breaths ok Last neb was 4 h prior to OV   rec For Cough - use the flutter valve as much as you can plus mucinex dm 1200 mg every 12 hours as needed supplement with percocet up to 2 every 4hours For Breathing  Only use your duoneb  rescue medication . - Ok to use up to    every 4 hours if you must  Prednisone 10 mg take  4 each am x 2 days,   2 each am x 2 days,  1 each am x 2 days and stop  Pantoprazole (protonix) 40 mg    Take 30-60 min before first meal of the day and Pepcid 20 mg one bedtime until return to office - this is the best way to tell whether stomach acid is contributing to your problem.  GERD  Diet . Please schedule a follow up office visit in 2 weeks, sooner if needed with all meds in hand  07/27/2014 f/u ov/Joseph [name] re: asthma / still smoking  Chief Complaint  Patient presents with  . Follow-up    Pt c/o increased SOB and cough for the past month- cough is occ prod with minimal clear sputum. Pt currently taking clindamycin for staph infection. Rarely using albuterol inhaler, but is using neb approx 4 times per day.   off dulera x 1.5 week denies more sob/ cough or need for rescue on vs off rec Plan A=  dulera 200 Take 2 puffs first thing in am and then another 2 puffs about 12 hours later (or formulary alternative- call us if you find a cheaper one)  Plan B =  Backup = proaire  Plan C = Backup plan B = nebulized albuterol up to 4 hours but call if needing this much at all The key is to stop smoking completely before smoking completely stops you!    11/29/2014 f/u ov/Joseph [name] re: s/p "aecopd" still smoking  Chief Complaint  Patient presents with  . Follow-up    Pt states that his SOB and cough have been worse for the past several wks. He went to ED for COPD exacerbation on 11/14/14.  He is using proair 3-4 times per day and neb about 3 times per day on average.   one month prior to flare needing proair 1-2 per day and neb at least once or twice also - using neb first thing instead of dulera  Better p pred from er then worse again w/in a week of taper off assoc with congested cough but mostly white mucus esp in ams rec Think of your respiratory medications as multiple steps you can take to control your symptoms and avoid having to go to the ER Plan A is your maintenance daily no matter what meds: dulera Take 2 puffs first thing in am and then another 2 puffs about 12 hours later.  Plan B only use after  you've used your maintenance (Plan A) medication, and only if you can't catch your breath: Proair 2 every 4 hours as needed  Plan C only use after you've used plan A and B and still can't catch your breath: nebulizer albuterol 2.5 mg up to every 4 hours  Plan D(for Doctor):  If you've used A thru C and not doing a lot better or still needing  C more than a once a day,  D = call the doctor for evaluation asap Plan E (for ER):  If still not able to catch your breath, even after using your nebulizer up to every 4 hours, go to ER  For cough > delsym 2 tsp every 12 hours is the best you can buy Prevacid 30 mg Take 30-60 min before first meal of the day  GERD diet      03/08/2015 f/u ov/Joseph [name] re: still smoking / off dulera x 48 h and off saba hfa/ using neb 8 am of test /no change in symptoms or need for saba on or off dulera  Chief Complaint  Patient presents with  . Follow-up    PFT done today. Pt states that his breathing is about the same since last visit. He is using albuterol inhaler approx 3 x per day.   breathing does better if stays in house,  worse out in cold Even on dulera still using neb at least twice daily  Some sporadic dry fits of  cough with bending over even on dulera but not using ppi correctly  rec Prevacid 30 mg should be to Take 30- 60 min before your first and last meals of the day  I see no evidence that the dulera is helping you and would stop it at this point pending an asthma challenge test  Please see patient coordinator before you leave today  to schedule Methacholine test - do not take any albuterol with in 6 hours of the test> never done     10/03/2015  f/u ov/Joseph [name] re: still smoking /  ? asthma vs all vcd/ gerd  Chief Complaint  Patient presents with  . Follow-up    Pt states his breathing is overall doing well. He is using rescue inhaler and neb several times per day.   Wife left him in July 2016 and losing wt, dm better, breathing not since stopped dulera and  never went for mct and can't afford it  No noct symptoms after hs saba/ cpap  saba use up when no access to dulera 200 but even on it he continues to perceive need for saba daily despite nl exams/ pfts rec Please see patient coordinator before you leave today  to schedule a methacholine challenge test>  POSITIVE Ok to resume dulera but stop it 24 hours before the methacholine and no albuterol w/in 6 hours  Please schedule a follow up office visit in 4 weeks, sooner if needed     10/31/2015  f/u ov/Joseph [name] re: mild persistent asthma  Chief Complaint  Patient presents with  . Follow-up    Breathing has been worse since [place] in [place].  He is using rescue inhaler 4-5 x per day and duoneb 2 x per day.   Noted a change on the dulera 100 but can't afford dulera in any form/ reliant on samples rec Increase the dulera 200 Take 2 puffs first thing in am and then another 2 puffs about 12 hours later.  Only use your albuterol (proair) as a rescue medication   Only use the nebulizer if you try the Proair first and it doesn't work    12/12/2015  f/u ov/Joseph [name] re: intrinsic asthma  Doing fine as long as stays out of cold and on dulera 200 despite still smoking   Chief Complaint  Patient presents with  . Follow-up    Breathing has improved some. He is using rescue inhaler "just when I go outside". He  uses neb 5 x per wk on average.   rec No change in medications Keep working on getting rid of the cigarettes before they get rid of you   NP ov 05/07/17  We will give you samples of BREO today. Use this 1 puff once daily Use the Breo in place of [place].   05/23/2017  f/u ov/Joseph [name] re: intrinsic asthma/ BREO 200 / prn neb  Chief Complaint  Patient presents with  . Follow-up    Breathing has improved on Breo. He has only had to use neb x 3 since last visit. He does not currently have a rescue inhaler.   Not limited by breathing from desired activities  But not very active  No obvious day to day or  daytime variability or assoc excess/ purulent sputum or mucus plugs or hemoptysis or cp or chest tightness, subjective wheeze or overt sinus or hb symptoms. No unusual exp hx or h/o childhood pna/ asthma or knowledge of premature birth.  Sleeping ok without nocturnal  or early am exacerbation  of respiratory  c/o's or need for noct saba. Also denies any obvious fluctuation of symptoms with weather or environmental changes or other aggravating or alleviating factors except as outlined above   Current Medications, Allergies, Complete Past Medical History, Past Surgical History, Family History, and Social History were reviewed in [employer] record.  ROS  The following are not active complaints unless bolded sore throat, dysphagia, dental problems, itching, sneezing,  nasal congestion or excess/ purulent secretions, ear ache,   fever, chills, sweats, unintended wt loss, classically pleuritic or exertional cp,  orthopnea pnd or leg swelling, presyncope, palpitations, abdominal pain, anorexia, nausea, vomiting, diarrhea  or change in bowel or bladder habits, change in stools or urine, dysuria,hematuria,  rash, arthralgias, visual complaints, headache, numbness, weakness or ataxia or problems with walking or coordination,  change in mood/affect or memory.                 Objective:   Physical Exam  amb obese wm nad  / vital signs reviewed - Note on arrival 02 sats  98% on RA    12/31/2013  294  >    07/27/2014  286 > 11/29/2014   289> 03/08/2015  284 > 10/03/2015 266 > 10/31/2015 273 > 12/12/2015    280 > 05/23/2017  245      HEENT: nl dentition, turbinates, and oropharynx. Nl external ear canals without cough reflex   NECK :  without JVD/Nodes/TM/ nl carotid upstrokes bilaterally   LUNGS: no acc muscle use, clear to a and P bilaterally    CV:  RRR  no s3 or murmur or increase in P2, no edema   ABD:  soft and nontender with nl excursion in the supine position. No bruits or  organomegaly, bowel sounds nl  MS:  warm without deformities, calf tenderness, cyanosis or clubbing  SKIN: warm and dry without lesions    NEURO:  alert, approp, no deficits              Assessment & Plan:

## 2017-05-25 NOTE — Assessment & Plan Note (Signed)
-  PFT's s sign copd 06/2012 - med calendar 2/13 /15 > not using 11/29/14  - 11/29/2014 p extensive coaching HFA effectiveness =    90% from a baseline of 50%  -PFTs wnl 03/08/2015 4 h p alb neb> rec MCT at least 6 h p neb > done 10/12/2015  POS > resume dulera 100 2bid - 10/31/2015    increase dulera to 200 bid (samples provided)  - 05/23/2017  After extensive coaching device effectiveness =    90% with DPI though baseline Ti way too short  Despite poor baseline technique and active smoking > All goals of chronic asthma control met including optimal function and elimination of symptoms with minimal need for rescue therapy.  Contingencies discussed in full including contacting this office immediately if not controlling the symptoms using the rule of two's.     Warned regarding dpi cough and how to avoid by proper DPI technique and rinse/ gargle after use  I had an extended discussion with the patient reviewing all relevant studies completed to date and  lasting 15 to 20 minutes of a 25 minute visit    Each maintenance medication was reviewed in detail including most importantly the difference between maintenance and prns and under what circumstances the prns are to be triggered using an action plan format that is not reflected in the computer generated alphabetically organized AVS.    Please see AVS for specific instructions unique to this visit that I personally wrote and verbalized to the the pt in detail and then reviewed with pt  by my nurse highlighting any  changes in therapy recommended at today's visit to their plan of care.

## 2017-05-25 NOTE — Assessment & Plan Note (Signed)
Complicated by aodm/ osa /gerd   Body mass index is 35.15 kg/m.  -  trending down, enrcouraged Lab Results  Component Value Date   TSH 1.700 08/02/2016     Contributing to gerd risk/ doe/reviewed the need and the process to achieve and maintain neg calorie balance > defer f/u primary care including intermittently monitoring thyroid status  2

## 2017-05-25 NOTE — Assessment & Plan Note (Signed)

## 2017-05-26 ENCOUNTER — Encounter: Payer: Self-pay | Admitting: Primary Care

## 2017-06-03 ENCOUNTER — Encounter: Payer: Self-pay | Admitting: Orthopedic Surgery

## 2017-06-03 ENCOUNTER — Ambulatory Visit (INDEPENDENT_AMBULATORY_CARE_PROVIDER_SITE_OTHER): Payer: Medicare Other | Admitting: Orthopedic Surgery

## 2017-06-03 DIAGNOSIS — M25462 Effusion, left knee: Secondary | ICD-10-CM | POA: Diagnosis not present

## 2017-06-03 NOTE — Progress Notes (Signed)
Chief Complaint  Patient presents with  . Follow-up    RECURRING LEFT KNEE EFFUSION   Request aspiration   Procedure note injection and aspiration left knee joint  Verbal consent was obtained to aspirate and inject the left knee joint   Timeout was completed to confirm the site of aspiration and injection  An 18-gauge needle was used to aspirate the left knee joint from a suprapatellar lateral approach.  The medications used were 40 mg of Depo-Medrol and 1% lidocaine 3 cc  Anesthesia was provided by ethyl chloride and the skin was prepped with alcohol.  After cleaning the skin with alcohol an 18-gauge needle was used to aspirate the right knee joint.  We obtained 40 cc of fluid  We followed this by injection of 40 mg of Depo-Medrol and 3 cc 1% lidocaine.  There were no complications. A sterile bandage was applied.

## 2017-06-09 ENCOUNTER — Ambulatory Visit: Payer: Medicare Other | Admitting: Dietician

## 2017-06-23 ENCOUNTER — Other Ambulatory Visit: Payer: Self-pay | Admitting: Primary Care

## 2017-06-23 ENCOUNTER — Encounter: Payer: Self-pay | Admitting: Orthopedic Surgery

## 2017-06-23 ENCOUNTER — Ambulatory Visit (INDEPENDENT_AMBULATORY_CARE_PROVIDER_SITE_OTHER): Payer: Medicare Other | Admitting: Orthopedic Surgery

## 2017-06-23 ENCOUNTER — Encounter: Payer: Self-pay | Admitting: Primary Care

## 2017-06-23 DIAGNOSIS — M25462 Effusion, left knee: Secondary | ICD-10-CM

## 2017-06-23 DIAGNOSIS — K219 Gastro-esophageal reflux disease without esophagitis: Secondary | ICD-10-CM

## 2017-06-23 DIAGNOSIS — M5441 Lumbago with sciatica, right side: Secondary | ICD-10-CM

## 2017-06-23 NOTE — Progress Notes (Signed)
Procedure note injection and aspiration left knee joint  Verbal consent was obtained to aspirate and inject the left knee joint   Timeout was completed to confirm the site of aspiration and injection  An 18-gauge needle was used to aspirate the left knee joint from a suprapatellar lateral approach.  The medications used were 40 mg of Depo-Medrol and 1% lidocaine 3 cc  Anesthesia was provided by ethyl chloride and the skin was prepped with alcohol.  After cleaning the skin with alcohol an 18-gauge needle was used to aspirate the right knee joint.  We obtained 70 cc of fluid  We followed this by injection of 40 mg of Depo-Medrol and 3 cc 1% lidocaine.  There were no complications. A sterile bandage was applied.  Encounter Diagnosis  Name Primary?  . Effusion of knee joint, left Yes    The patient is interested in not hyaluronic acid injection. We cannot get that as a [facility] practice and we are referring him for potential hyaluronic acid injection

## 2017-06-23 NOTE — Telephone Encounter (Signed)
Ok to refill? Electronically refill request both medication have not been prescribed by Jae DireKate

## 2017-06-24 ENCOUNTER — Other Ambulatory Visit: Payer: Self-pay | Admitting: Orthopedic Surgery

## 2017-06-24 DIAGNOSIS — M5441 Lumbago with sciatica, right side: Secondary | ICD-10-CM

## 2017-06-24 NOTE — Telephone Encounter (Signed)
Discard message for patient. Addressed in another message.

## 2017-07-07 DIAGNOSIS — M1712 Unilateral primary osteoarthritis, left knee: Secondary | ICD-10-CM | POA: Diagnosis not present

## 2017-07-14 DIAGNOSIS — M1712 Unilateral primary osteoarthritis, left knee: Secondary | ICD-10-CM | POA: Diagnosis not present

## 2017-07-21 DIAGNOSIS — M1712 Unilateral primary osteoarthritis, left knee: Secondary | ICD-10-CM | POA: Diagnosis not present

## 2017-08-01 ENCOUNTER — Telehealth: Payer: Self-pay | Admitting: Orthopedic Surgery

## 2017-08-01 ENCOUNTER — Other Ambulatory Visit: Payer: Self-pay | Admitting: Primary Care

## 2017-08-01 DIAGNOSIS — G4700 Insomnia, unspecified: Secondary | ICD-10-CM

## 2017-08-01 NOTE — Telephone Encounter (Signed)
Patient called 07/31/17, approximately 4:50p.m, states same knee is swelling up again, and asked if Dr Romeo AppleHarrison was in.  Relayed that Dr is in surgery, and in briefly Friday, 08/01/17 morning, then out of office next week. Offered appointment upon Dr's return to clinic 08/11/17. States he's also treating with provider at [facility], Dr Charlett BlakeVoytek, which he said Dr Romeo AppleHarrison is aware, for a type of synvisc injection. Said he will cotnact that office, and will call back if needs anything set up here.

## 2017-08-01 NOTE — Telephone Encounter (Signed)
Ok to refill? Electronically refill request for traZODone (DESYREL) 50 MG tablet  Last prescribed on 04/10/2017. Last seen on 05/12/2017. Follow up on 08/18/2017

## 2017-08-01 NOTE — Telephone Encounter (Signed)
Noted, refills sent to pharmacy. 

## 2017-08-02 ENCOUNTER — Other Ambulatory Visit: Payer: Self-pay | Admitting: Primary Care

## 2017-08-05 ENCOUNTER — Encounter: Payer: Self-pay | Admitting: Primary Care

## 2017-08-05 ENCOUNTER — Other Ambulatory Visit: Payer: Self-pay | Admitting: Primary Care

## 2017-08-05 DIAGNOSIS — K5903 Drug induced constipation: Secondary | ICD-10-CM

## 2017-08-05 MED ORDER — BUPROPION HCL ER (SR) 150 MG PO TB12: 150.0000 mg | ORAL_TABLET | Freq: Two times a day (BID) | ORAL | 1 refills | Status: DC

## 2017-08-05 NOTE — Telephone Encounter (Signed)
Ok to refill? Electronically refill request for AMITIZA 24 MCG capsule   Medication have not been prescribed by Jae DireKate. There is also a MyChart message requesting the refill. Last seen on 05/12/2017

## 2017-08-05 NOTE — Telephone Encounter (Signed)
Please call patient and ask: 1. How long has he been taking Amitiza? Years? Months? 2. What opioid medication is he getting from pain management? Please update his chart. 3. Has he ever tried anything else OTC for constipation?  Please tell him I'm happy to help, but need more details regarding Amitiza.

## 2017-08-05 NOTE — Telephone Encounter (Signed)
Refill already sent to pharmacy.

## 2017-08-05 NOTE — Telephone Encounter (Signed)
Ok to refill? Electronically refill request for buPROPion (WELLBUTRIN SR) 150 MG 12 hr tablet  Medication have not been prescribed by Jae DireKate. There is also a MyCart message requesting this refill. Patient was last seen on 05/12/2017

## 2017-08-06 ENCOUNTER — Encounter: Payer: Self-pay | Admitting: Primary Care

## 2017-08-06 DIAGNOSIS — M25462 Effusion, left knee: Secondary | ICD-10-CM | POA: Diagnosis not present

## 2017-08-06 DIAGNOSIS — M25562 Pain in left knee: Secondary | ICD-10-CM | POA: Diagnosis not present

## 2017-08-06 NOTE — Telephone Encounter (Signed)
Patient called back.  1. He has been taking this medication for about 2 to 3 months now. 2. Nucynta 50 mg and it is already on his medication list 3. Yes, he had tried just about everything over the counter since his hernia surgery in 2007, from magnesium citrate, miralax and laxative.   Since patient been on Amitiza, it has worked well for him so far compare to other medications, he tried in the past.

## 2017-08-06 NOTE — Telephone Encounter (Signed)
Noted, refill sent to pharmacy. 

## 2017-08-06 NOTE — Telephone Encounter (Signed)
Message left for patient to return my call.  

## 2017-08-07 ENCOUNTER — Other Ambulatory Visit: Payer: Self-pay | Admitting: Primary Care

## 2017-08-07 DIAGNOSIS — E119 Type 2 diabetes mellitus without complications: Secondary | ICD-10-CM

## 2017-08-07 DIAGNOSIS — E785 Hyperlipidemia, unspecified: Secondary | ICD-10-CM

## 2017-08-12 ENCOUNTER — Other Ambulatory Visit (INDEPENDENT_AMBULATORY_CARE_PROVIDER_SITE_OTHER): Payer: Medicare Other

## 2017-08-12 DIAGNOSIS — E785 Hyperlipidemia, unspecified: Secondary | ICD-10-CM

## 2017-08-12 DIAGNOSIS — E119 Type 2 diabetes mellitus without complications: Secondary | ICD-10-CM | POA: Diagnosis not present

## 2017-08-12 LAB — LIPID PANEL
CHOL/HDL RATIO: 4
Cholesterol: 117 mg/dL (ref 0–200)
HDL: 32.3 mg/dL — AB (ref >39.00)
NONHDL: 84.55
Triglycerides: 214 mg/dL — ABNORMAL HIGH (ref 0.0–149.0)
VLDL: 42.8 mg/dL — AB (ref 0.0–40.0)

## 2017-08-12 LAB — LDL CHOLESTEROL, DIRECT: Direct LDL: 61 mg/dL

## 2017-08-12 LAB — HEMOGLOBIN A1C: Hgb A1c MFr Bld: 8.3 % — ABNORMAL HIGH (ref 4.6–6.5)

## 2017-08-14 DIAGNOSIS — M25462 Effusion, left knee: Secondary | ICD-10-CM | POA: Diagnosis not present

## 2017-08-15 ENCOUNTER — Ambulatory Visit: Payer: Medicare Other | Admitting: Primary Care

## 2017-08-15 DIAGNOSIS — E291 Testicular hypofunction: Secondary | ICD-10-CM | POA: Diagnosis not present

## 2017-08-18 ENCOUNTER — Ambulatory Visit (INDEPENDENT_AMBULATORY_CARE_PROVIDER_SITE_OTHER): Payer: Medicare Other | Admitting: Primary Care

## 2017-08-18 DIAGNOSIS — K5909 Other constipation: Secondary | ICD-10-CM | POA: Diagnosis not present

## 2017-08-18 DIAGNOSIS — E119 Type 2 diabetes mellitus without complications: Secondary | ICD-10-CM

## 2017-08-18 DIAGNOSIS — E785 Hyperlipidemia, unspecified: Secondary | ICD-10-CM

## 2017-08-18 NOTE — Assessment & Plan Note (Signed)
Recent lipid panel improved. Continue atorvastatin 20 mg. Continue to work on improvements in diet and start regular exercise.

## 2017-08-18 NOTE — Patient Instructions (Signed)
Continue to work on [employer]. Reduce fast food, biscuits, Coke.  Increase vegetables, fruit, whole grains.  Ensure you are consuming 64 ounces of water daily.  Start exercising. You should be getting 150 minutes of moderate intensity exercise weekly.  Check your blood pressure several times weekly, around the same time of day, for the several months.  Ensure that you have rested for 30 minutes prior to checking your blood pressure. Record your readings and bring them to your next visit. Your blood pressure should be less than 130/90.  Schedule a follow up visit in 3 months for re-evaluation of diabetes.  It was a pleasure to see you today!

## 2017-08-18 NOTE — Assessment & Plan Note (Signed)
Improvement in A1c from 9.5 to 8.3 on recent labs despite recent steroid injections to the knees. Suspect this is secondary to improvement in diet, commended him on this. Discussed to keep working on diet by limiting fast food, breakfast biscuits, Coke. Discussed to start regularly exercising.  Foot exam today unremarkable. Managed on statin. Urine microalbumin negative in June 2018. Consider lisinopril if blood pressure remains above goal.  Will have him continue to work on improvements in diet and recheck A1c in 3 months. Continue current medication regimen.

## 2017-08-18 NOTE — Assessment & Plan Note (Signed)
Opioid induced. Doing well on Amitiza since re-initiation, continue same.

## 2017-08-18 NOTE — Progress Notes (Signed)
Subjective:    Patient ID: Joseph [name], male    DOB: [birth year], 46 y.o.   MRN: [id number]  HPI  [name] is a 46 year old male who presents today for follow up.  1) Type 2 Diabetes:  Current medications include: Jardiance 25 mg, glipizide 5 mg BID, metformin XR 1000 mg once daily. He's been undergoing steroid injections to his left knee for the last several months.   He is checking his blood glucose occasionally (when feeling fatigued) and is getting readings of 150-170.   Last A1C: 8.3 in September 2018, reduced from 9.5 in June 2018 Last Eye Exam: Due Last Foot Exam: Completed today. Pneumonia Vaccination: Completed Pneumovax in 2009 ACE/ARB: None. Urine microalbumin negative in June 2018 Statin: Atorvastatin  Diet currently consists of:  Breakfast: Fast food, biscuits  Lunch: Skips Dinner: Therapist, art, fajitas, meat, vegetable, some starch Snacks: Occasional Chex Mix, reduced candy Desserts: Occasional pies/cakes Beverages: Coke (cutting back), water  Exercise: He does not currently exercise but is active.   2) Hyperlipidemia: Currently managed on atorvastatin 20 mg. Repeat lipid panel improved from June 2018.   3) Chronic Constipation: Previously on Amitiza for opioid induced constipation with significant improvement. Called into our office last week requesting a refill as he had been off of this medication for three weeks and experienced constipation. Previously prescribed this medication per pain management which has since gone out of business. He's doing well back on the Amitiza.   4) Chronic Knee Pain: Located to the left knee, diagnosed with pseudo gout. Currently following with orthopedics and has been undergoing injections. History of surgical intervention 8 months ago. Taking alprazolam per ortho for muscle spasms with improvement. Also managed on Nucenta.   Review of Systems  Eyes: Negative for visual disturbance.  Respiratory: Negative for shortness of  breath.   Cardiovascular: Negative for chest pain.  Gastrointestinal: Negative for constipation.  Musculoskeletal: Positive for arthralgias.  Neurological: Negative for dizziness and headaches.       Past Medical History:  Diagnosis Date  . Anxiety   . Arthritis   . Asthma   . Chronic back pain   . Chronic pain of left knee   . COPD (chronic obstructive pulmonary disease) (HCC)   . Diabetes mellitus   . GERD (gastroesophageal reflux disease)   . Shortness of breath   . Sleep apnea      Social History   Social History  . Marital status: Legally Separated    Spouse name: N/A  . Number of children: N/A  . Years of education: N/A   Occupational History  . disability    Social History Main Topics  . Smoking status: Current Every Day Smoker    Packs/day: 1.50    Years: 20.00    Types: Cigarettes    Start date: 12/17/1993  . Smokeless tobacco: Current User  . Alcohol use No  . Drug use: No  . Sexual activity: Yes    Birth control/ protection: Surgical   Other Topics Concern  . Not on file   Social History Narrative  . No narrative on file    Past Surgical History:  Procedure Laterality Date  . CARPAL TUNNEL RELEASE Left   . CATARACT EXTRACTION W/PHACO Right 03/11/2016   Procedure: CATARACT EXTRACTION PHACO AND INTRAOCULAR LENS PLACEMENT (IOC);  Surgeon: Gemma Payor, MD;  Location: [facility];  Service: Ophthalmology;  Laterality: Right;  CDE 4.24  . HERNIA REPAIR Bilateral   . KNEE ARTHROSCOPY WITH  MEDIAL MENISECTOMY Left 04/22/2014   Procedure: LEFT KNEE ARTHROSCOPY WITH MEDIAL MENISECTOMY;  Surgeon: Vickki Hearing, MD;  Location: [facility];  Service: Orthopedics;  Laterality: Left;  . KNEE ARTHROSCOPY WITH MEDIAL MENISECTOMY Right 12/06/2015   Procedure: RIGHT KNEE ARTHROSCOPY WITH MEDIAL MENISECTOMY;  Surgeon: Vickki Hearing, MD;  Location: [facility];  Service: Orthopedics;  Laterality: Right;  . KNEE ARTHROSCOPY WITH MEDIAL MENISECTOMY Left 03/20/2017   Procedure:  KNEE ARTHROSCOPY WITH MEDIAL MENISECTOMY;  Surgeon: Vickki Hearing, MD;  Location: [facility];  Service: Orthopedics;  Laterality: Left;  . ORIF WRIST FRACTURE Left 12/19/2016   Procedure: OPEN REDUCTION INTERNAL FIXATION (ORIF) WRIST FRACTURE;  Surgeon: Betha Loa, MD;  Location: [facility];  Service: Orthopedics;  Laterality: Left;  accumed   . SHOULDER ARTHROCENTESIS Right   . VASECTOMY      Family History  Problem Relation Age of Onset  . Emphysema Maternal Grandfather   . Emphysema Maternal Grandmother   . Clotting disorder Maternal Grandmother     Allergies  Allergen Reactions  . Azithromycin Hives  . Erythromycin Hives  . Keflex [Cephalexin] Nausea Only  . Metformin Nausea And Vomiting  . Symbicort [Budesonide-Formoterol Fumarate] Other (See Comments)    States that 3 doses were used and breathing became worse    Current Outpatient Prescriptions on File Prior to Visit  Medication Sig Dispense Refill  . albuterol (PROVENTIL) (2.5 MG/3ML) 0.083% nebulizer solution USE 1 VIAL IN NEBULIZER FOUR TIMES DAILY AS NEEDED FOR SHORTNESS OF BREATH 360 mL 0  . ALPRAZolam (XANAX) 1 MG tablet TAKE ONE TABLET TWICE DAILY AS NEEDED FOR ANXIETY (Patient taking differently: TAKE ONE TABLET TWICE DAILY) 60 tablet 5  . AMITIZA 24 MCG capsule TAKE ONE TABLET BY MOUTH TWICE A DAY 180 capsule 1  . atorvastatin (LIPITOR) 20 MG tablet Take 1 tablet (20 mg total) by mouth every evening. 90 tablet 3  . buPROPion (WELLBUTRIN SR) 150 MG 12 hr tablet Take 1 tablet (150 mg total) by mouth 2 (two) times daily. 180 tablet 1  . CINNAMON PO Take 2,800 mg by mouth daily. EACH TABLET IS 1400 MG.  TAKES 2 CAPSULES DAILY    . citalopram (CELEXA) 40 MG tablet TAKE ONE (1) TABLET BY MOUTH EVERY DAY 30 tablet 11  . diphenhydrAMINE (BENADRYL) 25 MG tablet Take 25 mg by mouth 2 (two) times daily.    . empagliflozin (JARDIANCE) 10 MG TABS tablet Take 10 mg by mouth every morning. 30 tablet 5  .  fluticasone furoate-vilanterol (BREO ELLIPTA) 200-25 MCG/INH AEPB Inhale 1 puff into the lungs daily. 30 each 11  . glipiZIDE (GLUCOTROL) 5 MG tablet Take 2 tabs bid (Patient taking differently: Take 10 mg by mouth 2 (two) times daily before a meal. ) 360 tablet 1  . ipratropium (ATROVENT) 0.02 % nebulizer solution USE 1 VIAL IN NEBULIZER FOUR TIMES DAILY AS NEEDED 300 mL 0  . Ipratropium-Albuterol (COMBIVENT) 20-100 MCG/ACT AERS respimat Inhale 1 puff into the lungs every 6 (six) hours as needed for wheezing. 1 Inhaler 2  . lansoprazole (PREVACID) 30 MG capsule TAKE ONE CAPSULE BY MOUTH TWICE A DAY BEFORE A MEAL 180 capsule 1  . metFORMIN (GLUCOPHAGE XR) 500 MG 24 hr tablet Take 1 tablet daily with breakfast for two weeks, then advance to 2 tablets with breakfast thereafter. 180 tablet 1  . Multiple Vitamin (MULTIVITAMIN WITH MINERALS) TABS Take 1 tablet by mouth every morning. Men's once daily multivitamin packet (vitamin e, calcium,  ginseng)    . mupirocin ointment (BACTROBAN) 2 % Place 1 application into the nose 2 (two) times daily. 22 g 0  . nabumetone (RELAFEN) 500 MG tablet TAKE ONE TABLET BY MOUTH TWICE A DAY 60 tablet 5  . Saw Palmetto, Serenoa repens, (SAW PALMETTO PO) Take 1 capsule by mouth daily.    . sildenafil (REVATIO) 20 MG tablet TAKE 2-3 TABLETS AS NEEDED (Patient taking differently: TAKE 3 TABLETS AS NEEDED) 20 tablet 11  . tamsulosin (FLOMAX) 0.4 MG CAPS capsule TAKE ONE (1) CAPSULE EACH DAY 90 capsule 1  . tapentadol (NUCYNTA) 50 MG tablet Take 50 mg by mouth 3 (three) times daily.    . traZODone (DESYREL) 50 MG tablet TAKE ONE TO TWO TABLETS BY MOUTH AT BEDTIME AS NEEDED FOR SLEEP 90 tablet 1  . triamcinolone cream (KENALOG) 0.1 % Apply 1 application topically 2 (two) times daily. 45 g 0   No current facility-administered medications on file prior to visit.     BP 140/82   Pulse 88   Temp 98.2 F (36.8 C) (Oral)   Ht  (1.778 m)   Wt 244 lb 12.8 oz (111 kg)    SpO2 96%   BMI 35.13 kg/m    Objective:   Physical Exam  Constitutional: He appears well-nourished.  Neck: Neck supple.  Cardiovascular: Normal rate and regular rhythm.   Pulmonary/Chest: Effort normal and breath sounds normal.  Skin: Skin is warm and dry.  Psychiatric: He has a normal mood and affect.          Assessment & Plan:

## 2017-08-21 DIAGNOSIS — M25462 Effusion, left knee: Secondary | ICD-10-CM | POA: Diagnosis not present

## 2017-08-26 DIAGNOSIS — M25562 Pain in left knee: Secondary | ICD-10-CM | POA: Diagnosis not present

## 2017-08-27 DIAGNOSIS — M25562 Pain in left knee: Secondary | ICD-10-CM | POA: Diagnosis not present

## 2017-08-27 DIAGNOSIS — M25462 Effusion, left knee: Secondary | ICD-10-CM | POA: Diagnosis not present

## 2017-09-04 ENCOUNTER — Telehealth: Payer: Self-pay | Admitting: Orthopedic Surgery

## 2017-09-04 NOTE — Telephone Encounter (Signed)
Patient has been seeing providers at [facility], per Dr Harrison's referral. States he has "just" had another MRI of his left knee - performed at [facility] mobile unit.  States he also had the left knee aspirated, "but it wasn't done the way Dr Romeo Apple does it".  States he is next to be scheduled for Supartz or Orthovisc injections.  Requesting to see Dr Romeo Apple for "draining of the left knee."  Please advise.

## 2017-09-05 NOTE — Telephone Encounter (Signed)
Called back to patient.

## 2017-09-05 NOTE — Telephone Encounter (Signed)
If getting synvisvc they will do it then

## 2017-09-08 DIAGNOSIS — M25562 Pain in left knee: Secondary | ICD-10-CM | POA: Diagnosis not present

## 2017-09-08 DIAGNOSIS — M1712 Unilateral primary osteoarthritis, left knee: Secondary | ICD-10-CM | POA: Diagnosis not present

## 2017-09-09 ENCOUNTER — Other Ambulatory Visit: Payer: Self-pay | Admitting: Primary Care

## 2017-09-09 ENCOUNTER — Encounter: Payer: Self-pay | Admitting: Primary Care

## 2017-09-09 ENCOUNTER — Other Ambulatory Visit: Payer: Self-pay | Admitting: Family Medicine

## 2017-09-09 DIAGNOSIS — E119 Type 2 diabetes mellitus without complications: Secondary | ICD-10-CM

## 2017-09-09 NOTE — Telephone Encounter (Signed)
Ok to refill? Electronically refill request for empagliflozin (JARDIANCE) 10 MG TABS tablet  Medication have not been prescribed by Jae Dire. Last seen on 08/18/2017

## 2017-09-09 NOTE — Telephone Encounter (Signed)
Noted, refills sent to pharmacy. Reviewed and he does take 10 mg, not 25 as indicated in my last note.

## 2017-09-10 NOTE — Telephone Encounter (Signed)
This was addressed in refill ecounter

## 2017-09-24 ENCOUNTER — Other Ambulatory Visit: Payer: Self-pay | Admitting: Primary Care

## 2017-09-24 ENCOUNTER — Other Ambulatory Visit: Payer: Self-pay | Admitting: Family Medicine

## 2017-09-24 ENCOUNTER — Encounter: Payer: Self-pay | Admitting: Primary Care

## 2017-09-24 NOTE — Telephone Encounter (Signed)
Will address via MyChart

## 2017-09-24 NOTE — Telephone Encounter (Signed)
Ok to refill? Electronically refill request for ALPRAZolam [name](XANAX) 1 MG tablet  Medication have not been prescribed by Jae DireKate. Last seen on 08/18/2017

## 2017-09-29 DIAGNOSIS — M25462 Effusion, left knee: Secondary | ICD-10-CM | POA: Diagnosis not present

## 2017-10-06 ENCOUNTER — Other Ambulatory Visit: Payer: Self-pay | Admitting: Family Medicine

## 2017-10-07 ENCOUNTER — Other Ambulatory Visit: Payer: Self-pay | Admitting: Primary Care

## 2017-10-15 DIAGNOSIS — M25462 Effusion, left knee: Secondary | ICD-10-CM | POA: Diagnosis not present

## 2017-10-16 ENCOUNTER — Telehealth: Payer: Self-pay

## 2017-10-16 ENCOUNTER — Telehealth: Payer: Self-pay | Admitting: Primary Care

## 2017-10-16 ENCOUNTER — Encounter: Payer: Self-pay | Admitting: Primary Care

## 2017-10-16 DIAGNOSIS — G894 Chronic pain syndrome: Secondary | ICD-10-CM

## 2017-10-16 NOTE — Telephone Encounter (Signed)
Please notify patient that the pain management referral was placed.

## 2017-10-16 NOTE — Telephone Encounter (Signed)
Copied from CRM [phone]. Topic: Referral - Request >> Oct 16, 2017 10:19 AM Arlyss Gandyichardson, Taren N, NT wrote: Reason for CRM: Patient requesting a referral to [facility]. The clinic he was going to went bankrupt. Fax#: [phone]. He will have [facility] with [facility] January 29th for partial knee replacement on the left knee.

## 2017-10-16 NOTE — Telephone Encounter (Signed)
Pt last seen 08/18/17.Please advise.

## 2017-10-16 NOTE — Telephone Encounter (Signed)
Please notify patient that I received a medical clearance form for his upcoming surgery with [facility]. He needs to schedule an office visit for surgical clearance that will include an EKG.  Please schedule this at his convenience.  Needs to be a 30-minute visit.

## 2017-10-17 DIAGNOSIS — M25462 Effusion, left knee: Secondary | ICD-10-CM | POA: Diagnosis not present

## 2017-10-17 NOTE — Telephone Encounter (Signed)
Message left for patient to return my call.  

## 2017-10-20 NOTE — Telephone Encounter (Signed)
Spoken and notified patient of Kate's comments. Patient verbalized understanding. 

## 2017-10-20 NOTE — Telephone Encounter (Signed)
Spoken and notified patient of Kate's comments. Patient verbalized understanding.  Schedule appt on 10/21/2017

## 2017-10-21 ENCOUNTER — Ambulatory Visit (INDEPENDENT_AMBULATORY_CARE_PROVIDER_SITE_OTHER): Payer: Medicare Other | Admitting: Primary Care

## 2017-10-21 ENCOUNTER — Encounter: Payer: Self-pay | Admitting: Primary Care

## 2017-10-21 VITALS — BP 130/78 | HR 82 | Temp 98.2°F | Ht 70.0 in | Wt 241.1 lb

## 2017-10-21 DIAGNOSIS — Z23 Encounter for immunization: Secondary | ICD-10-CM | POA: Diagnosis not present

## 2017-10-21 DIAGNOSIS — E119 Type 2 diabetes mellitus without complications: Secondary | ICD-10-CM | POA: Diagnosis not present

## 2017-10-21 DIAGNOSIS — Z01818 Encounter for other preprocedural examination: Secondary | ICD-10-CM

## 2017-10-21 DIAGNOSIS — S61412A Laceration without foreign body of left hand, initial encounter: Secondary | ICD-10-CM

## 2017-10-21 MED ORDER — FREESTYLE LIBRE 14 DAY READER DEVI: 1.0000 | Freq: Three times a day (TID) | 0 refills | Status: DC

## 2017-10-21 MED ORDER — FREESTYLE LIBRE 14 DAY SENSOR MISC: 1.0000 | Freq: Three times a day (TID) | 0 refills | Status: DC

## 2017-10-21 NOTE — Patient Instructions (Addendum)
Increase your Trazodone to 150 mg (3 tablets) for insomnia. If this helps I can send  the 150 mg tablet to your pharmacy.   We'll repeat your labs in December as scheduled.  It was a pleasure to see you today!

## 2017-10-21 NOTE — Progress Notes (Signed)
Subjective:    Patient ID: Ginny ForthJerry R Mitro, male    DOB: 26-Nov-1971, 46 y.o.   MRN: [id number]  HPI  [name] is a 46 year old male who presents today for surgical clearance, he also wants to discuss insomnia.   He will be undergoing a left partial knee replacement on 12/30/16.  He has a history of type 2 diabetes with his last A1C at 8.3 in September 2018. He is managed on metformin ER 1000 mg once daily and glipizide 10 mg BID, Jardiance 10. He is not checking his blood sugars. He's recently lost weight through cutting back on sugar, cutting back on portion sizes, and making healthier food choices. He is not exercising much given the pain and decrease in ROM to his left knee.  Wt Readings from Last 3 Encounters:  10/21/17 241 lb 1.9 oz (109.4 kg)  08/18/17 244 lb 12.8 oz (111 kg)  05/23/17 245 lb (111.1 kg)    2) Insomnia: Long history of difficulty falling asleep. Previoulsy managed on alprazolam 1 mg for which he took nightly for difficulty sleeping. He was discouraged to use this medication given that he's already on chronic pain medication for his arthritis and knee pain. He was switched to Trazodone 50 mg several months ago and is up to 100 mg. He's noticed that he'll feel drowsy and will sometimes fall asleep without difficulty. He will wake up during the night with urinary incontinence, 2-3 times weekly, this is overall improved. He thinks the knee pain is causing him trouble falling asleep.     Review of Systems  Constitutional: Negative for fever and unexpected weight change.  Respiratory: Negative for shortness of breath.   Cardiovascular: Negative for chest pain.  Gastrointestinal: Negative for abdominal pain.  Musculoskeletal: Positive for arthralgias.  Neurological: Negative for dizziness and headaches.  Psychiatric/Behavioral: Positive for sleep disturbance.       Past Medical History:  Diagnosis Date  . Anxiety   . Arthritis   . Asthma   . Chronic back pain   .  Chronic pain of left knee   . COPD (chronic obstructive pulmonary disease) (HCC)   . Diabetes mellitus   . GERD (gastroesophageal reflux disease)   . Shortness of breath   . Sleep apnea      Social History   Socioeconomic History  . Marital status: Legally Separated    Spouse name: Not on file  . Number of children: Not on file  . Years of education: Not on file  . Highest education level: Not on file  Social Needs  . Financial resource strain: Not on file  . Food insecurity - worry: Not on file  . Food insecurity - inability: Not on file  . Transportation needs - medical: Not on file  . Transportation needs - non-medical: Not on file  Occupational History  . Occupation: disability  Tobacco Use  . Smoking status: Current Every Day Smoker    Packs/day: 1.50    Years: 20.00    Pack years: 30.00    Types: Cigarettes    Start date: 12/17/1993  . Smokeless tobacco: Current User  Substance and Sexual Activity  . Alcohol use: No    Alcohol/week: 0.0 oz  . Drug use: No  . Sexual activity: Yes    Birth control/protection: Surgical  Other Topics Concern  . Not on file  Social History Narrative  . Not on file    Past Surgical History:  Procedure Laterality Date  .  CARPAL TUNNEL RELEASE Left   . CATARACT EXTRACTION W/PHACO Right 03/11/2016   Procedure: CATARACT EXTRACTION PHACO AND INTRAOCULAR LENS PLACEMENT (IOC);  Surgeon: Gemma Payor, MD;  Location: [facility];  Service: Ophthalmology;  Laterality: Right;  CDE 4.24  . HERNIA REPAIR Bilateral   . KNEE ARTHROSCOPY WITH MEDIAL MENISECTOMY Left 04/22/2014   Procedure: LEFT KNEE ARTHROSCOPY WITH MEDIAL MENISECTOMY;  Surgeon: Vickki Hearing, MD;  Location: [facility];  Service: Orthopedics;  Laterality: Left;  . KNEE ARTHROSCOPY WITH MEDIAL MENISECTOMY Right 12/06/2015   Procedure: RIGHT KNEE ARTHROSCOPY WITH MEDIAL MENISECTOMY;  Surgeon: Vickki Hearing, MD;  Location: [facility];  Service: Orthopedics;  Laterality: Right;  . KNEE  ARTHROSCOPY WITH MEDIAL MENISECTOMY Left 03/20/2017   Procedure: KNEE ARTHROSCOPY WITH MEDIAL MENISECTOMY;  Surgeon: Vickki Hearing, MD;  Location: [facility];  Service: Orthopedics;  Laterality: Left;  . ORIF WRIST FRACTURE Left 12/19/2016   Procedure: OPEN REDUCTION INTERNAL FIXATION (ORIF) WRIST FRACTURE;  Surgeon: Betha Loa, MD;  Location:  [facility];  Service: Orthopedics;  Laterality: Left;  accumed   . SHOULDER ARTHROCENTESIS Right   . VASECTOMY      Family History  Problem Relation Age of Onset  . Emphysema Maternal Grandfather   . Emphysema Maternal Grandmother   . Clotting disorder Maternal Grandmother     Allergies  Allergen Reactions  . Azithromycin Hives  . Erythromycin Hives  . Keflex [Cephalexin] Nausea Only  . Metformin Nausea And Vomiting  . Symbicort [Budesonide-Formoterol Fumarate] Other (See Comments)    States that 3 doses were used and breathing became worse    Current Outpatient Medications on File Prior to Visit  Medication Sig Dispense Refill  . albuterol (PROVENTIL) (2.5 MG/3ML) 0.083% nebulizer solution USE 1 VIAL IN NEBULIZER FOUR TIMES DAILY AS NEEDED FOR SHORTNESS OF BREATH 360 mL 0  . ALPRAZolam (XANAX) 1 MG tablet TAKE ONE TABLET TWICE DAILY AS NEEDED FOR ANXIETY (Patient taking differently: TAKE ONE TABLET TWICE DAILY) 60 tablet 5  . AMITIZA 24 MCG capsule TAKE ONE TABLET BY MOUTH TWICE A DAY 180 capsule 1  . atorvastatin (LIPITOR) 20 MG tablet Take 1 tablet (20 mg total) by mouth every evening. 90 tablet 3  . buPROPion (WELLBUTRIN SR) 150 MG 12 hr tablet Take 1 tablet (150 mg total) by mouth 2 (two) times daily. 180 tablet 1  . CINNAMON PO Take 2,800 mg by mouth daily. EACH TABLET IS 1400 MG.  TAKES 2 CAPSULES DAILY    . citalopram (CELEXA) 40 MG tablet TAKE ONE (1) TABLET BY MOUTH EVERY DAY 30 tablet 11  . diphenhydrAMINE (BENADRYL) 25 MG tablet Take 25 mg by mouth 2 (two) times daily.    . empagliflozin (JARDIANCE) 10 MG TABS  tablet Take 10 mg by mouth every morning. 90 tablet 2  . fluticasone furoate-vilanterol (BREO ELLIPTA) 200-25 MCG/INH AEPB Inhale 1 puff into the lungs daily. 30 each 11  . glipiZIDE (GLUCOTROL) 5 MG tablet TAKE TWO TABLETS BY MOUTH TWICE DAILY 360 tablet 1  . ipratropium (ATROVENT) 0.02 % nebulizer solution USE 1 VIAL IN NEBULIZER FOUR TIMES DAILY AS NEEDED 300 mL 0  . Ipratropium-Albuterol (COMBIVENT) 20-100 MCG/ACT AERS respimat Inhale 1 puff into the lungs every 6 (six) hours as needed for wheezing. 1 Inhaler 2  . lansoprazole (PREVACID) 30 MG capsule TAKE ONE CAPSULE BY MOUTH TWICE A DAY BEFORE A MEAL 180 capsule 1  . metFORMIN (GLUCOPHAGE XR) 500 MG 24 hr tablet Take 1 tablet daily  with breakfast for two weeks, then advance to 2 tablets with breakfast thereafter. 180 tablet 1  . Multiple Vitamin (MULTIVITAMIN WITH MINERALS) TABS Take 1 tablet by mouth every morning. Men's once daily multivitamin packet (vitamin e, calcium, ginseng)    . nabumetone (RELAFEN) 500 MG tablet TAKE ONE TABLET BY MOUTH TWICE A DAY 60 tablet 5  . Saw Palmetto, Serenoa repens, (SAW PALMETTO PO) Take 1 capsule by mouth daily.    . sildenafil (REVATIO) 20 MG tablet TAKE 2-3 TABLETS AS NEEDED (Patient taking differently: TAKE 3 TABLETS AS NEEDED) 20 tablet 11  . tamsulosin (FLOMAX) 0.4 MG CAPS capsule TAKE ONE (1) CAPSULE EACH DAY 90 capsule 1  . tapentadol (NUCYNTA) 50 MG tablet Take 50 mg by mouth 3 (three) times daily.    Marland Kitchen. testosterone (ANDROGEL) 50 MG/5GM (1%) GEL Place 5 g onto the skin daily.    . traZODone (DESYREL) 50 MG tablet TAKE ONE TO TWO TABLETS BY MOUTH AT BEDTIME AS NEEDED FOR SLEEP 90 tablet 1  . triamcinolone cream (KENALOG) 0.1 % Apply 1 application topically 2 (two) times daily. 45 g 0   No current facility-administered medications on file prior to visit.     BP 130/78   Pulse 82   Temp 98.2 F (36.8 C) (Oral)   Ht 5\' 10"  (1.778 m)   Wt 241 lb 1.9 oz (109.4 kg)   SpO2 98%   BMI 34.60 kg/m      Objective:   Physical Exam  Constitutional: He appears well-nourished.  Neck: Neck supple.  Cardiovascular: Normal rate and regular rhythm.  Pulmonary/Chest: Breath sounds normal. No respiratory distress.  Abdominal: Soft. Bowel sounds are normal. There is no tenderness.  Skin: Skin is warm and dry.          Assessment & Plan:  Pre-Surgical Clearance:  Scheduled for left partial knee replacement on 12/30/17. ECG today with NSR, rate of 82, no ST elevation or depression, no T-wave inversion. Exam today unremarkable. Repeat A1C due next month, overall decreasing and stable for surgery. Encouraged continue weight loss through healthy diet.  Cleared for surgery, will fax forms.  Morrie Sheldonlark,Terese Heier Kendal, NP

## 2017-10-21 NOTE — Addendum Note (Signed)
Addended by: Tawnya CrookSAMBATH, Andra Matsuo on: 10/21/2017 05:05 PM   Modules accepted: Orders

## 2017-10-28 ENCOUNTER — Encounter: Payer: Self-pay | Admitting: Primary Care

## 2017-10-29 NOTE — Telephone Encounter (Signed)
I have called patient's pharmacy. She stated that the meter is only available to certain pharmacy like [facility]. [facility] Not sure if the insurance will cover due to certain requirements. Patient is aware.

## 2017-10-29 NOTE — Telephone Encounter (Signed)
See My Chart message regarding meter, can you find out what's going on?

## 2017-11-03 ENCOUNTER — Encounter: Payer: Self-pay | Admitting: Pulmonary Disease

## 2017-11-03 DIAGNOSIS — H2512 Age-related nuclear cataract, left eye: Secondary | ICD-10-CM | POA: Diagnosis not present

## 2017-11-03 DIAGNOSIS — H2181 Floppy iris syndrome: Secondary | ICD-10-CM | POA: Diagnosis not present

## 2017-11-03 DIAGNOSIS — E119 Type 2 diabetes mellitus without complications: Secondary | ICD-10-CM | POA: Diagnosis not present

## 2017-11-03 DIAGNOSIS — H25012 Cortical age-related cataract, left eye: Secondary | ICD-10-CM | POA: Diagnosis not present

## 2017-11-03 LAB — HM DIABETES EYE EXAM

## 2017-11-04 ENCOUNTER — Encounter: Payer: Self-pay | Admitting: Primary Care

## 2017-11-05 ENCOUNTER — Encounter: Payer: Self-pay | Admitting: Nurse Practitioner

## 2017-11-05 ENCOUNTER — Ambulatory Visit: Payer: Medicare Other | Attending: Nurse Practitioner | Admitting: Nurse Practitioner

## 2017-11-05 ENCOUNTER — Other Ambulatory Visit: Payer: Self-pay

## 2017-11-05 VITALS — BP 131/80 | HR 90 | Temp 98.2°F | Resp 20 | Ht 70.0 in | Wt 240.0 lb

## 2017-11-05 DIAGNOSIS — G4733 Obstructive sleep apnea (adult) (pediatric): Secondary | ICD-10-CM | POA: Insufficient documentation

## 2017-11-05 DIAGNOSIS — Z789 Other specified health status: Secondary | ICD-10-CM | POA: Diagnosis not present

## 2017-11-05 DIAGNOSIS — J449 Chronic obstructive pulmonary disease, unspecified: Secondary | ICD-10-CM | POA: Insufficient documentation

## 2017-11-05 DIAGNOSIS — Z79899 Other long term (current) drug therapy: Secondary | ICD-10-CM

## 2017-11-05 DIAGNOSIS — M899 Disorder of bone, unspecified: Secondary | ICD-10-CM

## 2017-11-05 DIAGNOSIS — G894 Chronic pain syndrome: Secondary | ICD-10-CM | POA: Insufficient documentation

## 2017-11-05 DIAGNOSIS — F17210 Nicotine dependence, cigarettes, uncomplicated: Secondary | ICD-10-CM | POA: Insufficient documentation

## 2017-11-05 DIAGNOSIS — Z9889 Other specified postprocedural states: Secondary | ICD-10-CM | POA: Diagnosis not present

## 2017-11-05 DIAGNOSIS — Z6834 Body mass index (BMI) 34.0-34.9, adult: Secondary | ICD-10-CM | POA: Insufficient documentation

## 2017-11-05 DIAGNOSIS — E119 Type 2 diabetes mellitus without complications: Secondary | ICD-10-CM | POA: Insufficient documentation

## 2017-11-05 DIAGNOSIS — G8929 Other chronic pain: Secondary | ICD-10-CM | POA: Diagnosis not present

## 2017-11-05 DIAGNOSIS — M25532 Pain in left wrist: Secondary | ICD-10-CM | POA: Diagnosis not present

## 2017-11-05 DIAGNOSIS — M25562 Pain in left knee: Secondary | ICD-10-CM | POA: Insufficient documentation

## 2017-11-05 DIAGNOSIS — Z79891 Long term (current) use of opiate analgesic: Secondary | ICD-10-CM | POA: Diagnosis not present

## 2017-11-05 DIAGNOSIS — M25561 Pain in right knee: Secondary | ICD-10-CM

## 2017-11-05 NOTE — Patient Instructions (Addendum)
____________________________________________________________________________________________  Appointment Policy Summary  It is our goal and responsibility to provide the medical community with assistance in the evaluation and management of patients with chronic pain. Unfortunately our resources are limited. Because we do not have an unlimited amount of time, or available appointments, we are required to closely monitor and manage their use. The following rules exist to maximize their use:  Patient's responsibilities: 1. Punctuality:  At what time should I arrive? You should be physically present in our office 30 minutes before your scheduled appointment. Your scheduled appointment is with your assigned healthcare provider. However, it takes 5-10 minutes to be "checked-in", and another 15 minutes for the nurses to do the admission. If you arrive to our office at the time you were given for your appointment, you will end up being at least 20-25 minutes late to your appointment with the provider. 2. Tardiness:  What happens if I arrive only a few minutes after my scheduled appointment time? You will need to reschedule your appointment. The cutoff is your appointment time. This is why it is so important that you arrive at least 30 minutes before that appointment. If you have an appointment scheduled for 10:00 AM and you arrive at 10:01, you will be required to reschedule your appointment.  3. Plan ahead:  Always assume that you will encounter traffic on your way in. Plan for it. If you are dependent on a driver, make sure they understand these rules and the need to arrive early. 4. Other appointments and responsibilities:  Avoid scheduling any other appointments before or after your pain clinic appointments.  5. Be prepared:  Write down everything that you need to discuss with your healthcare provider and give this information to the admitting nurse. Write down the medications that you will need  refilled. Bring your pills and bottles (even the empty ones), to all of your appointments, except for those where a procedure is scheduled. 6. No children or pets:  Find someone to take care of them. It is not appropriate to bring them in. 7. Scheduling changes:  We request "advanced notification" of any changes or cancellations. 8. Advanced notification:  Defined as a time period of more than 24 hours prior to the originally scheduled appointment. This allows for the appointment to be offered to other patients. 9. Rescheduling:  When a visit is rescheduled, it will require the cancellation of the original appointment. For this reason they both fall within the category of "Cancellations".  10. Cancellations:  They require advanced notification. Any cancellation less than 24 hours before the  appointment will be recorded as a "No Show". 11. No Show:  Defined as an unkept appointment where the patient failed to notify or declare to the practice their intention or inability to keep the appointment.  Corrective process for repeat offenders:  1. Tardiness: Three (3) episodes of rescheduling due to late arrivals will be recorded as one (1) "No Show". 2. Cancellation or reschedule: Three (3) cancellations or rescheduling will be recorded as one (1) "No Show". 3. "No Shows": Three (3) "No Shows" within a 12 month period will result in discharge from the practice.  ____________________________________________________________________________________________   ____________________________________________________________________________________________  Pain Scale  Introduction: The pain score used by this practice is the Verbal Numerical Rating Scale (VNRS-11). This is an 11-point scale. It is for adults and children 10 years or older. There are significant differences in how the pain score is reported, used, and applied. Forget everything you learned in the past  and learn this scoring  system.  General Information: The scale should reflect your current level of pain. Unless you are specifically asked for the level of your worst pain, or your average pain. If you are asked for one of these two, then it should be understood that it is over the past 24 hours.  Basic Activities of Daily Living (ADL): Personal hygiene, dressing, eating, transferring, and using restroom.  Instructions: Most patients tend to report their level of pain as a combination of two factors, their physical pain and their psychosocial pain. This last one is also known as "suffering" and it is reflection of how physical pain affects you socially and psychologically. From now on, report them separately. From this point on, when asked to report your pain level, report only your physical pain. Use the following table for reference.  Pain Clinic Pain Levels (0-5/10)  Pain Level Score  Description  No Pain 0   Mild pain 1 Nagging, annoying, but does not interfere with basic activities of daily living (ADL). Patients are able to eat, bathe, get dressed, toileting (being able to get on and off the toilet and perform personal hygiene functions), transfer (move in and out of bed or a chair without assistance), and maintain continence (able to control bladder and bowel functions). Blood pressure and heart rate are unaffected. A normal heart rate for a healthy adult ranges from 60 to 100 bpm (beats per minute).   Mild to moderate pain 2 Noticeable and distracting. Impossible to hide from other people. More frequent flare-ups. Still possible to adapt and function close to normal. It can be very annoying and may have occasional stronger flare-ups. With discipline, patients may get used to it and adapt.   Moderate pain 3 Interferes significantly with activities of daily living (ADL). It becomes difficult to feed, bathe, get dressed, get on and off the toilet or to perform personal hygiene functions. Difficult to get in and out of  bed or a chair without assistance. Very distracting. With effort, it can be ignored when deeply involved in activities.   Moderately severe pain 4 Impossible to ignore for more than a few minutes. With effort, patients may still be able to manage work or participate in some social activities. Very difficult to concentrate. Signs of autonomic nervous system discharge are evident: dilated pupils (mydriasis); mild sweating (diaphoresis); sleep interference. Heart rate becomes elevated (>115 bpm). Diastolic blood pressure (lower number) rises above 100 mmHg. Patients find relief in laying down and not moving.   Severe pain 5 Intense and extremely unpleasant. Associated with frowning face and frequent crying. Pain overwhelms the senses.  Ability to do any activity or maintain social relationships becomes significantly limited. Conversation becomes difficult. Pacing back and forth is common, as getting into a comfortable position is nearly impossible. Pain wakes you up from deep sleep. Physical signs will be obvious: pupillary dilation; increased sweating; goosebumps; brisk reflexes; cold, clammy hands and feet; nausea, vomiting or dry heaves; loss of appetite; significant sleep disturbance with inability to fall asleep or to remain asleep. When persistent, significant weight loss is observed due to the complete loss of appetite and sleep deprivation.  Blood pressure and heart rate becomes significantly elevated. Caution: If elevated blood pressure triggers a pounding headache associated with blurred vision, then the patient should immediately seek attention at an urgent or emergency care unit, as these may be signs of an impending stroke.    Emergency Department Pain Levels (6-10/10)  Emergency Room Pain  6 Severely limiting. Requires emergency care and should not be seen or managed at an outpatient pain management facility. Communication becomes difficult and requires great effort. Assistance to reach the  emergency department may be required. Facial flushing and profuse sweating along with potentially dangerous increases in heart rate and blood pressure will be evident.   Distressing pain 7 Self-care is very difficult. Assistance is required to transport, or use restroom. Assistance to reach the emergency department will be required. Tasks requiring coordination, such as bathing and getting dressed become very difficult.   Disabling pain 8 Self-care is no longer possible. At this level, pain is disabling. The individual is unable to do even the most "basic" activities such as walking, eating, bathing, dressing, transferring to a bed, or toileting. Fine motor skills are lost. It is difficult to think clearly.   Incapacitating pain 9 Pain becomes incapacitating. Thought processing is no longer possible. Difficult to remember your own name. Control of movement and coordination are lost.   The worst pain imaginable 10 At this level, most patients pass out from pain. When this level is reached, collapse of the autonomic nervous system occurs, leading to a sudden drop in blood pressure and heart rate. This in turn results in a temporary and dramatic drop in blood flow to the brain, leading to a loss of consciousness. Fainting is one of the body's self defense mechanisms. Passing out puts the brain in a calmed state and causes it to shut down for a while, in order to begin the healing process.    Summary: 1. Refer to this scale when providing us with your pain level. 2. Be accurate and careful when reporting your pain level. This will help with your care. 3. Over-reporting your pain level will lead to loss of credibility. 4. Even a level of 1/10 means that there is pain and will be treated at our facility. 5. High, inaccurate reporting will be documented as "Symptom Exaggeration", leading to loss of credibility and suspicions of possible secondary gains such as obtaining more narcotics, or wanting to appear  disabled, for fraudulent reasons. 6. Only pain levels of 5 or below will be seen at our facility. 7. Pain levels of 6 and above will be sent to the Emergency Department and the appointment cancelled. ____________________________________________________________________________________________    BMI Assessment: Estimated body mass index is 34.44 kg/m as calculated from the following:   Height as of this encounter: 5\' 10"  (1.778 m).   Weight as of this encounter: 240 lb (108.9 kg).  BMI interpretation table: BMI level Category Range association with higher incidence of chronic pain  <18 kg/m2 Underweight   18.5-24.9 kg/m2 Ideal body weight   25-29.9 kg/m2 Overweight Increased incidence by 20%  30-34.9 kg/m2 Obese (Class I) Increased incidence by 68%  35-39.9 kg/m2 Severe obesity (Class II) Increased incidence by 136%  >40 kg/m2 Extreme obesity (Class III) Increased incidence by 254%   BMI Readings from Last 4 Encounters:  11/05/17 34.44 kg/m  10/21/17 34.60 kg/m  08/18/17 35.13 kg/m  05/23/17 35.15 kg/m   Wt Readings from Last 4 Encounters:  11/05/17 240 lb (108.9 kg)  10/21/17 241 lb 1.9 oz (109.4 kg)  08/18/17 244 lb 12.8 oz (111 kg)  05/23/17 245 lb (111.1 kg)

## 2017-11-05 NOTE — Progress Notes (Signed)
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  Component Value Date   ALKPHOS 66 05/08/2017   CALCIUM 9.4 05/08/2017   TESTOSTERONE 208.98 (L) 04/17/2017   Coagulation Parameters Lab Results  Component Value Date   PLT 214 03/17/2017   Cardiovascular Markers Lab Results  Component Value Date   HGB 14.0 03/17/2017   HCT 41.1 03/17/2017   Note: Lab results reviewed.  [place]  Drug: Mr. Hermiz  reports that he does not use drugs. Alcohol:  reports that he does not drink alcohol. Tobacco:  reports that he has been smoking cigarettes.  He started smoking about 23 years ago. He has a 30.00 pack-year smoking history. He uses smokeless tobacco. Medical:  has a past medical history of Anxiety, Arthritis, Asthma, Chronic back pain, Chronic pain of left knee, COPD (chronic obstructive pulmonary disease) ([place]), Diabetes mellitus, GERD (gastroesophageal reflux disease), Shortness of breath, and Sleep apnea. Family: family history includes Clotting disorder in his maternal  grandmother; Emphysema in his maternal grandfather and maternal grandmother.  Past Surgical History:  Procedure Laterality Date  . CARPAL TUNNEL RELEASE Left   . CATARACT EXTRACTION W/PHACO Right 03/11/2016   Procedure: CATARACT EXTRACTION PHACO AND INTRAOCULAR LENS PLACEMENT (IOC);  Surgeon: Tonny Branch, MD;  Location: [facility];  Service: Ophthalmology;  Laterality: Right;  CDE 4.24  . HERNIA REPAIR Bilateral   . KNEE ARTHROSCOPY WITH MEDIAL MENISECTOMY Left 04/22/2014   Procedure: LEFT KNEE ARTHROSCOPY WITH MEDIAL MENISECTOMY;  Surgeon: Carole Civil, MD;  Location: [facility];  Service: Orthopedics;  Laterality: Left;  . KNEE ARTHROSCOPY WITH MEDIAL MENISECTOMY Right 12/06/2015   Procedure: RIGHT KNEE ARTHROSCOPY WITH MEDIAL MENISECTOMY;  Surgeon: Carole Civil, MD;  Location: [facility];  Service: Orthopedics;  Laterality: Right;  . KNEE ARTHROSCOPY WITH MEDIAL MENISECTOMY Left 03/20/2017   Procedure: KNEE ARTHROSCOPY WITH MEDIAL MENISECTOMY;  Surgeon: Carole Civil, MD;  Location: [facility];  Service: Orthopedics;  Laterality: Left;  . ORIF WRIST FRACTURE Left 12/19/2016   Procedure: OPEN REDUCTION INTERNAL FIXATION (ORIF) WRIST FRACTURE;  Surgeon: Leanora Cover, MD;  Location:  SURGERY  [facility];  Service: Orthopedics;  Laterality: Left;  accumed   . SHOULDER ARTHROCENTESIS Right   . VASECTOMY     Active Ambulatory Problems    Diagnosis Date Noted  . INGUINAL PAIN, LEFT 10/03/2009  . Mediastinal abnormality 04/10/2012  . Cigarette smoker 04/10/2012  . Chronic pain syndrome 05/13/2013  . Diabetes ([place]) 05/13/2013  . Intrinsic asthma 12/19/2013  . Medial meniscus, posterior horn derangement 04/22/2014  . S/P knee surgery 04/26/2014  . Acute meniscal tear of left knee 04/26/2014  . Testosterone deficiency 07/01/2014  . OSA (obstructive sleep apnea) 08/31/2014  . Morbid obesity ([place]) 10/06/2015  . Anxiety and depression 10/07/2015  . Bucket handle tear of meniscus of right knee    . Skin lesions 03/12/2017  . GERD (gastroesophageal reflux disease) 03/12/2017  . Primary osteoarthritis of left knee   . Post-void dribbling 04/01/2017  . COPD without exacerbation ([place]) 05/07/2017  . Hyperlipidemia 05/12/2017  . Chronic constipation 08/18/2017   Resolved Ambulatory Problems    Diagnosis Date Noted  . ABDOMINAL PAIN, GENERALIZED, CHRONIC 10/03/2009  . Dyspnea 08/14/2012   Past Medical History:  Diagnosis Date  . Anxiety   . Arthritis   . Asthma   . Chronic back pain   . Chronic pain of left knee   . COPD (chronic obstructive pulmonary disease) ([place])   . Diabetes mellitus   . GERD (gastroesophageal reflux disease)   . Shortness of breath   . Sleep apnea    Constitutional Exam  General appearance: Well nourished, well developed, and well hydrated. In no apparent acute distress Vitals:   11/05/17 1000  BP: 131/80  Pulse: 90  Resp: 20  Temp: 98.2 F (36.8 C)  TempSrc: Oral  SpO2: 98%  Weight: 240 lb (108.9 kg)  Height: '5\' 10"'  (1.778 m)   BMI Assessment: Estimated body mass index is 34.44 kg/m as calculated from the following:   Height as of this encounter: '5\' 10"'  (1.778 m).   Weight as of this encounter: 240 lb (108.9 kg).  BMI interpretation table: BMI level Category Range association with higher incidence of chronic pain  <18 kg/m2 Underweight   18.5-24.9 kg/m2 Ideal body weight   25-29.9 kg/m2 Overweight Increased incidence by 20%  30-34.9 kg/m2 Obese (Class I) Increased incidence by 68%  35-39.9 kg/m2 Severe obesity (Class II) Increased incidence by 136%  >40 kg/m2 Extreme obesity (Class III) Increased incidence by 254%   BMI Readings from Last 4 Encounters:  11/05/17 34.44 kg/m  10/21/17 34.60 kg/m  08/18/17 35.13 kg/m  05/23/17 35.15 kg/m   Wt Readings from Last 4 Encounters:  11/05/17 240 lb (108.9 kg)  10/21/17 241 lb 1.9 oz (109.4 kg)  08/18/17 244 lb 12.8 oz (111 kg)  05/23/17 245 lb (111.1 kg)  Psych/Mental status: Alert,  oriented x 3 (person, place, & time)       Eyes: PERLA Respiratory: No evidence of acute respiratory distress  Cervical Spine Exam  Inspection: No masses, redness, or swelling Alignment: Symmetrical Functional ROM: Unrestricted ROM      Stability: No instability detected Muscle strength & Tone: Functionally intact Sensory: Unimpaired Palpation: No palpable anomalies              Upper Extremity (UE) Exam    Side: Right upper extremity  Side: Left upper extremity  Inspection: No masses, redness, swelling, or asymmetry. No contractures  Inspection: scar from previous surgery well healed  Functional ROM: Unrestricted ROM  Functional ROM: Unrestricted ROM for wrist  Muscle strength & Tone: Functionally intact  Muscle strength & Tone: Functionally intact  Sensory: Unimpaired  Sensory: Unimpaired  Palpation: No palpable anomalies              Palpation: No palpable anomalies              Specialized Test(s): Deferred         Specialized Test(s): Deferred          Thoracic Spine Exam  Inspection: No masses, redness, or swelling Alignment: Symmetrical Functional ROM: Unrestricted ROM Stability: No instability detected Sensory: Unimpaired Muscle strength & Tone: No palpable anomalies  Lumbar Spine Exam  Inspection: No masses, redness, or swelling Alignment: Symmetrical Functional ROM: Unrestricted ROM      Stability: No instability detected Muscle strength & Tone: Functionally intact Sensory: Unimpaired Palpation: No palpable anomalies       Provocative Tests: Lumbar Hyperextension and rotation test: evaluation deferred today       Patrick's Maneuver: evaluation deferred today                    Gait & Posture Assessment  Ambulation: Unassisted Gait: Antalgic gait (limping) Posture: WNL   Lower Extremity Exam    Side: Right lower extremity  Side: Left lower extremity  Inspection: scar from previous surgery  Inspection: Edema  Functional ROM: Unrestricted ROM           Functional ROM: Unrestricted ROM          Muscle strength & Tone: Functionally intact  Muscle strength & Tone: Movement possible against some resistance (4/5)  Sensory: Unimpaired  Sensory: Unimpaired  Palpation: Crepitus noted  Palpation: No palpable anomalies   Assessment  Primary Diagnosis & Pertinent Problem List: There were no encounter diagnoses.  Visit Diagnosis: No diagnosis found. Plan of Care  Initial treatment plan:  Please be advised that as per protocol, today's visit has been an evaluation only. We have not taken over the patient's controlled substance management.  Problem-specific plan: No problem-specific Assessment & Plan notes found for this encounter.  Ordered Lab-work, Procedure(s), Referral(s), & Consult(s): No orders of the defined types were placed in this encounter.  Pharmacotherapy: Medications ordered:  No orders of the defined types were placed in this encounter.  Medications administered during this visit: Arn Medal "Richard" had no medications administered during this visit.   Pharmacotherapy under consideration:  Opioid Analgesics: The patient was informed that there is no guarantee that he would be a candidate for opioid analgesics. The decision will be made following CDC guidelines. This decision will be based on the results of diagnostic studies, as well as Mr. Ide risk profile.  Membrane stabilizer: To be determined at a later time Muscle relaxant: To be determined at a later time NSAID: To be determined at a later time Other analgesic(s): To be determined at a later time   Interventional therapies under consideration: Mr. Iovino was informed that there is no guarantee that he would be a candidate for interventional therapies. The decision will be based on the results of diagnostic studies, as well as Mr. Littler risk profile.  Possible procedure(s): Diagnostic Left intra-articular knee injection Diagnostic Left genicular nerve  block Possible Left genicular RFA Diagnostic Left Hyalgan series    Provider-requested follow-up: Return for 2nd Visit, w/ Dr. Dossie Arbour, after [facility] eval.  Future Appointments  Date Time Provider [place]  11/06/2017  2:45 PM Rigoberto Noel, MD [facility] None  11/17/2017 10:30 AM [facility]  11/20/2017 10:00 AM Pleas Koch, NP [facility]  12/16/2017 10:00 AM [facility] PAT 3 [facility] None    Primary Care Physician: Pleas Koch, NP Location: [facility] Out[facility] Note by:  Date: 11/05/2017; Time: 10:36 AM  Pain Score Disclaimer: We use the NRS-11 scale. This is a self-reported, subjective measurement of pain severity with only modest accuracy. It is used primarily to identify changes within a particular patient. It must be understood that outpatient pain scales are significantly less accurate that those used for research, where they can be applied under ideal controlled circumstances with minimal exposure to variables. In reality, the score is likely to be a combination of pain intensity and pain affect, where pain affect describes the degree of emotional arousal or changes in action readiness caused by the sensory experience of pain. Factors such as social and work situation, setting, emotional state, anxiety levels, expectation, and prior pain experience may influence pain perception and show large inter-individual differences that may also be affected by time variables.  Patient instructions provided during this appointment: Patient Instructions    ____________________________________________________________________________________________  Appointment Policy Summary  It is our goal and responsibility to provide the medical community with assistance in the evaluation and management of patients with chronic pain. Unfortunately our resources are limited. Because we do not have an unlimited amount of time, or available  appointments, we are required to closely monitor and manage their use. The following rules exist to maximize their use:  Patient's responsibilities: 1. Punctuality:  At what time should I arrive? You should be physically present in our office 30 minutes before your scheduled appointment. Your scheduled appointment is with your assigned healthcare provider. However, it takes 5-10 minutes to be "checked-in", and another 15 minutes for the nurses to do the admission. If you arrive to our office at the time you were given for your appointment, you will end up being at least 20-25 minutes late to your appointment with the provider. 2. Tardiness:  What happens if I arrive only a few minutes after my scheduled appointment time? You will need to reschedule your appointment. The cutoff is your appointment time. This is why it is so important that you arrive at least 30 minutes before that appointment. If you have an appointment scheduled for 10:00 AM and you arrive at 10:01, you will be required to reschedule your appointment.  3. Plan ahead:  Always assume that you will encounter traffic on your way in. Plan for it. If you are dependent on a driver, make sure they understand these rules and the need to arrive early. 4. Other appointments and responsibilities:  Avoid scheduling any other appointments before or after your pain clinic appointments.  5. Be prepared:  Write down everything that you need to discuss with your healthcare provider and give this information to the admitting nurse. Write down the medications that you will need refilled. Bring your pills and bottles (even the empty ones), to all of your appointments, except for those where a procedure is scheduled. 6. No children or pets:  Find someone to take care of them. It is not appropriate to bring them in. 7. Scheduling changes:  We request "advanced notification" of any changes or cancellations. 8. Advanced notification:  Defined as a time  period of more than 24 hours prior to the originally scheduled appointment. This allows for the appointment to be offered to other patients. 9. Rescheduling:  When a  visit is rescheduled, it will require the cancellation of the original appointment. For this reason they both fall within the category of "Cancellations".  10. Cancellations:  They require advanced notification. Any cancellation less than 24 hours before the  appointment will be recorded as a "No Show". 11. No Show:  Defined as an unkept appointment where the patient failed to notify or declare to the practice their intention or inability to keep the appointment.  Corrective process for repeat offenders:  1. Tardiness: Three (3) episodes of rescheduling due to late arrivals will be recorded as one (1) "No Show". 2. Cancellation or reschedule: Three (3) cancellations or rescheduling will be recorded as one (1) "No Show". 3. "No Shows": Three (3) "No Shows" within a 12 month period will result in discharge from the practice.  ____________________________________________________________________________________________   ____________________________________________________________________________________________  Pain Scale  Introduction: The pain score used by this practice is the Verbal Numerical Rating Scale (VNRS-11). This is an 11-point scale. It is for adults and children 10 years or older. There are significant differences in how the pain score is reported, used, and applied. Forget everything you learned in the past and learn this scoring system.  General Information: The scale should reflect your current level of pain. Unless you are specifically asked for the level of your worst pain, or your average pain. If you are asked for one of these two, then it should be understood that it is over the past 24 hours.  Basic Activities of Daily Living (ADL): Personal hygiene, dressing, eating, transferring, and using  restroom.  Instructions: Most patients tend to report their level of pain as a combination of two factors, their physical pain and their psychosocial pain. This last one is also known as "suffering" and it is reflection of how physical pain affects you socially and psychologically. From now on, report them separately. From this point on, when asked to report your pain level, report only your physical pain. Use the following table for reference.  Pain Clinic Pain Levels (0-5/10)  Pain Level Score  Description  No Pain 0   Mild pain 1 Nagging, annoying, but does not interfere with basic activities of daily living (ADL). Patients are able to eat, bathe, get dressed, toileting (being able to get on and off the toilet and perform personal hygiene functions), transfer (move in and out of bed or a chair without assistance), and maintain continence (able to control bladder and bowel functions). Blood pressure and heart rate are unaffected. A normal heart rate for a healthy adult ranges from 60 to 100 bpm (beats per minute).   Mild to moderate pain 2 Noticeable and distracting. Impossible to hide from other people. More frequent flare-ups. Still possible to adapt and function close to normal. It can be very annoying and may have occasional stronger flare-ups. With discipline, patients may get used to it and adapt.   Moderate pain 3 Interferes significantly with activities of daily living (ADL). It becomes difficult to feed, bathe, get dressed, get on and off the toilet or to perform personal hygiene functions. Difficult to get in and out of bed or a chair without assistance. Very distracting. With effort, it can be ignored when deeply involved in activities.   Moderately severe pain 4 Impossible to ignore for more than a few minutes. With effort, patients may still be able to manage work or participate in some social activities. Very difficult to concentrate. Signs of autonomic nervous system discharge are  evident: dilated pupils (mydriasis);  mild sweating (diaphoresis); sleep interference. Heart rate becomes elevated (>115 bpm). Diastolic blood pressure (lower number) rises above 100 mmHg. Patients find relief in laying down and not moving.   Severe pain 5 Intense and extremely unpleasant. Associated with frowning face and frequent crying. Pain overwhelms the senses.  Ability to do any activity or maintain social relationships becomes significantly limited. Conversation becomes difficult. Pacing back and forth is common, as getting into a comfortable position is nearly impossible. Pain wakes you up from deep sleep. Physical signs will be obvious: pupillary dilation; increased sweating; goosebumps; brisk reflexes; cold, clammy hands and feet; nausea, vomiting or dry heaves; loss of appetite; significant sleep disturbance with inability to fall asleep or to remain asleep. When persistent, significant weight loss is observed due to the complete loss of appetite and sleep deprivation.  Blood pressure and heart rate becomes significantly elevated. Caution: If elevated blood pressure triggers a pounding headache associated with blurred vision, then the patient should immediately seek attention at an urgent or emergency care unit, as these may be signs of an impending stroke.    Emergency Department Pain Levels (6-10/10)  Emergency Room Pain 6 Severely limiting. Requires emergency care and should not be seen or managed at an outpatient pain management facility. Communication becomes difficult and requires great effort. Assistance to reach the emergency department may be required. Facial flushing and profuse sweating along with potentially dangerous increases in heart rate and blood pressure will be evident.   Distressing pain 7 Self-care is very difficult. Assistance is required to transport, or use restroom. Assistance to reach the emergency department will be required. Tasks requiring coordination, such as  bathing and getting dressed become very difficult.   Disabling pain 8 Self-care is no longer possible. At this level, pain is disabling. The individual is unable to do even the most "basic" activities such as walking, eating, bathing, dressing, transferring to a bed, or toileting. Fine motor skills are lost. It is difficult to think clearly.   Incapacitating pain 9 Pain becomes incapacitating. Thought processing is no longer possible. Difficult to remember your own name. Control of movement and coordination are lost.   The worst pain imaginable 10 At this level, most patients pass out from pain. When this level is reached, collapse of the autonomic nervous system occurs, leading to a sudden drop in blood pressure and heart rate. This in turn results in a temporary and dramatic drop in blood flow to the brain, leading to a loss of consciousness. Fainting is one of the body's self defense mechanisms. Passing out puts the brain in a calmed state and causes it to shut down for a while, in order to begin the healing process.    Summary: 1. Refer to this scale when providing [place] with your pain level. 2. Be accurate and careful when reporting your pain level. This will help with your care. 3. Over-reporting your pain level will lead to loss of credibility. 4. Even a level of 1/10 means that there is pain and will be treated at our facility. 5. High, inaccurate reporting will be documented as "Symptom Exaggeration", leading to loss of credibility and suspicions of possible secondary gains such as obtaining more narcotics, or wanting to appear disabled, for fraudulent reasons. 6. Only pain levels of 5 or below will be seen at our facility. 7. Pain levels of 6 and above will be sent to the Emergency Department and the appointment cancelled. ____________________________________________________________________________________________

## 2017-11-06 ENCOUNTER — Ambulatory Visit (INDEPENDENT_AMBULATORY_CARE_PROVIDER_SITE_OTHER): Payer: Medicare Other | Admitting: Pulmonary Disease

## 2017-11-06 ENCOUNTER — Encounter: Payer: Self-pay | Admitting: Pulmonary Disease

## 2017-11-06 ENCOUNTER — Other Ambulatory Visit: Payer: Self-pay | Admitting: Primary Care

## 2017-11-06 VITALS — BP 122/80 | HR 106 | Ht 70.0 in | Wt 246.2 lb

## 2017-11-06 DIAGNOSIS — J45909 Unspecified asthma, uncomplicated: Secondary | ICD-10-CM

## 2017-11-06 DIAGNOSIS — E119 Type 2 diabetes mellitus without complications: Secondary | ICD-10-CM

## 2017-11-06 DIAGNOSIS — G4733 Obstructive sleep apnea (adult) (pediatric): Secondary | ICD-10-CM

## 2017-11-06 NOTE — Assessment & Plan Note (Signed)
Well-controlled on breo Low risk for arthroscopic knee surgery

## 2017-11-06 NOTE — Patient Instructions (Signed)
Congratulations on your weight loss Drop CPAP pr to 8 cm  Home sleep study

## 2017-11-06 NOTE — Assessment & Plan Note (Signed)
Possible that due to weight loss, he requires less pressure Drop CPAP pr to 8 cm CPAP has really helped improve his daytime somnolence and fatigue  Home sleep study Perioperative precautions for OSA

## 2017-11-06 NOTE — Progress Notes (Signed)
   Subjective:    Patient ID: Joseph [name], male    DOB: [birth year], 46 y.o.   MRN: [id number]  HPI  46 yo  smoker, for FU of OSA He sees MW for chronic cough-and is maintained on dulera for COPD, of note he is 'allergic' to Symbicort.  He obtained a new CPAP in 2017.  He has lost about 19 pounds from 263 in 09/2016-246 on his current visit.  He was as high as 280 pounds. He is slated for partial knee replacement on his left knee in January. He wonders if his pressure is set too high.   his mask was damaged and he slept without his CPAP for 4 nights and felt really good.  Wonders if he really needs the machine. Meanwhile download shows excellent compliance on 10 cm with good control of events and minimal leak  Significant tests/ events reviewed  NPSG 10/2011 -AHI of 19/h and lowest desaturation of 86% >>corrected by CPAP of 8 cm with a full face mask.  Review of Systems Patient denies significant dyspnea,cough, hemoptysis,  chest pain, palpitations, pedal edema, orthopnea, paroxysmal nocturnal dyspnea, lightheadedness, nausea, vomiting, abdominal or  leg pains      Objective:   Physical Exam  Gen. Pleasant, obese, in no distress ENT - no lesions, no post nasal drip Neck: No JVD, no thyromegaly, no carotid bruits Lungs: no use of accessory muscles, no dullness to percussion, decreased without rales or rhonchi  Cardiovascular: Rhythm regular, heart sounds  normal, no murmurs or gallops, no peripheral edema Musculoskeletal: No deformities, no cyanosis or clubbing , no tremors       Assessment & Plan:

## 2017-11-09 LAB — COMP. METABOLIC PANEL (12)
AST: 13 IU/L (ref 0–40)
Albumin/Globulin Ratio: 2 (ref 1.2–2.2)
Albumin: 4.8 g/dL (ref 3.5–5.5)
Alkaline Phosphatase: 100 IU/L (ref 39–117)
BUN / CREAT RATIO: 10 (ref 9–20)
BUN: 10 mg/dL (ref 6–24)
Bilirubin Total: 0.7 mg/dL (ref 0.0–1.2)
CALCIUM: 9.6 mg/dL (ref 8.7–10.2)
Chloride: 97 mmol/L (ref 96–106)
Creatinine, Ser: 1.03 mg/dL (ref 0.76–1.27)
GFR, EST AFRICAN AMERICAN: 100 mL/min/{1.73_m2} (ref >59)
GFR, EST NON AFRICAN AMERICAN: 86 mL/min/{1.73_m2} (ref >59)
GLOBULIN, TOTAL: 2.4 g/dL (ref 1.5–4.5)
GLUCOSE: 250 mg/dL — AB (ref 65–99)
Potassium: 4.6 mmol/L (ref 3.5–5.2)
Sodium: 138 mmol/L (ref 134–144)
TOTAL PROTEIN: 7.2 g/dL (ref 6.0–8.5)

## 2017-11-09 LAB — 25-HYDROXY VITAMIN D LCMS D2+D3: 25-Hydroxy, Vitamin D-2: <1 ng/mL

## 2017-11-09 LAB — 25-HYDROXYVITAMIN D LCMS D2+D3
25-HYDROXY, VITAMIN D-3: 42 ng/mL
25-HYDROXY, VITAMIN D: 42 ng/mL

## 2017-11-09 LAB — VITAMIN B12: Vitamin B-12: 341 pg/mL (ref 232–1245)

## 2017-11-09 LAB — C-REACTIVE PROTEIN: CRP: 0.7 mg/L (ref 0.0–4.9)

## 2017-11-09 LAB — SEDIMENTATION RATE: Sed Rate: 11 mm/hr (ref 0–15)

## 2017-11-09 LAB — MAGNESIUM: Magnesium: 2.1 mg/dL (ref 1.6–2.3)

## 2017-11-10 LAB — COMPLIANCE DRUG ANALYSIS, UR

## 2017-11-14 ENCOUNTER — Other Ambulatory Visit: Payer: Self-pay | Admitting: Primary Care

## 2017-11-14 DIAGNOSIS — N3943 Post-void dribbling: Secondary | ICD-10-CM

## 2017-11-14 DIAGNOSIS — E119 Type 2 diabetes mellitus without complications: Secondary | ICD-10-CM

## 2017-11-16 ENCOUNTER — Encounter: Payer: Self-pay | Admitting: Primary Care

## 2017-11-17 ENCOUNTER — Other Ambulatory Visit (INDEPENDENT_AMBULATORY_CARE_PROVIDER_SITE_OTHER): Payer: Medicare Other

## 2017-11-17 DIAGNOSIS — E119 Type 2 diabetes mellitus without complications: Secondary | ICD-10-CM

## 2017-11-17 LAB — HEMOGLOBIN A1C: HEMOGLOBIN A1C: 8.3 % — AB (ref 4.6–6.5)

## 2017-11-20 ENCOUNTER — Ambulatory Visit (INDEPENDENT_AMBULATORY_CARE_PROVIDER_SITE_OTHER): Payer: Medicare Other | Admitting: Primary Care

## 2017-11-20 ENCOUNTER — Encounter: Payer: Self-pay | Admitting: Primary Care

## 2017-11-20 VITALS — BP 122/82 | HR 83 | Temp 98.0°F | Ht 70.0 in | Wt 238.1 lb

## 2017-11-20 DIAGNOSIS — R21 Rash and other nonspecific skin eruption: Secondary | ICD-10-CM

## 2017-11-20 DIAGNOSIS — G8929 Other chronic pain: Secondary | ICD-10-CM | POA: Diagnosis not present

## 2017-11-20 DIAGNOSIS — G4700 Insomnia, unspecified: Secondary | ICD-10-CM | POA: Insufficient documentation

## 2017-11-20 DIAGNOSIS — E119 Type 2 diabetes mellitus without complications: Secondary | ICD-10-CM | POA: Diagnosis not present

## 2017-11-20 DIAGNOSIS — M25562 Pain in left knee: Secondary | ICD-10-CM

## 2017-11-20 MED ORDER — CLOTRIMAZOLE 1 % EX CREA: 1.0000 "application " | TOPICAL_CREAM | Freq: Two times a day (BID) | CUTANEOUS | 0 refills | Status: DC

## 2017-11-20 MED ORDER — TRAZODONE HCL 150 MG PO TABS: 150.0000 mg | ORAL_TABLET | Freq: Every day | ORAL | 1 refills | Status: DC

## 2017-11-20 NOTE — Assessment & Plan Note (Signed)
Scheduled for surgery to left knee on 12/30/17.

## 2017-11-20 NOTE — Addendum Note (Signed)
Addended by: Doreene NestLARK, Korin Setzler K on: 11/20/2017 12:58 PM   Modules accepted: Orders

## 2017-11-20 NOTE — Progress Notes (Addendum)
Subjective:    Patient ID: Joseph [name], male    DOB: 12-02-1971, 46 y.o.   MRN: [id number]  HPI  [name] is a 46 year old male who presents today for follow up.  1) Type 2 Diabetes:  Current medications include: Jardiance 10 mg, Glipizide 10 mg BID, Metformin 1000 mg once daily. He has been getting knee injections every 4-6 weeks ago.   He is checking his/her blood glucose 1-2 times weekly and is getting readings of: Only checks when he "feels shaky". 110-499, mostly in the low 200's.   Last A1C: 8.3 in December 2018 Last Eye Exam: Completed in December 2018 Last Foot Exam: Completed in September 2018 Pneumonia Vaccination: Completed in 2009, due again today. ACE/ARB: Urine microalbumin negative in June 2018 Statin: Lipitor  Diet currently consists of:  Breakfast: Skips  Lunch: Fast food Dinner: Meat, vegetable, starch  Snacks: Occasionally  Desserts: Daily (candy, cookies, cakes) Beverages: Cherry Coke, sweet tea, no water  Exercise: He is not currently exercise.    Wt Readings from Last 3 Encounters:  11/20/17 238 lb 1.9 oz (108 kg)  11/06/17 246 lb 3.2 oz (111.7 kg)  11/05/17 240 lb (108.9 kg)    2) Rash: Located to bilateral upper extremities and anterior trunk that he first noticed 2-3 weeks ago. He denies itching, changes in soaps/detergents/food. No one else in his household is itching. He's applied triamcinolone 0.1% cream infrequently without much improvement.     Review of Systems  Respiratory: Negative for shortness of breath.   Cardiovascular: Negative for chest pain.  Musculoskeletal:       Chronic left knee pain  Skin: Positive for rash.  Neurological: Negative for dizziness and headaches.       Past Medical History:  Diagnosis Date  . Anxiety   . Arthritis   . Asthma   . Chronic back pain   . Chronic pain of left knee   . COPD (chronic obstructive pulmonary disease) (HCC)   . Diabetes mellitus   . GERD (gastroesophageal reflux  disease)   . Shortness of breath   . Sleep apnea      Social History   Socioeconomic History  . Marital status: Legally Separated    Spouse name: Not on file  . Number of children: Not on file  . Years of education: Not on file  . Highest education level: Not on file  Social Needs  . Financial resource strain: Not on file  . Food insecurity - worry: Not on file  . Food insecurity - inability: Not on file  . Transportation needs - medical: Not on file  . Transportation needs - non-medical: Not on file  Occupational History  . Occupation: disability  Tobacco Use  . Smoking status: Current Every Day Smoker    Packs/day: 1.50    Years: 20.00    Pack years: 30.00    Types: Cigarettes    Start date: 12/17/1993  . Smokeless tobacco: Former NeurosurgeonUser  . Tobacco comment: 1ppd as of 11/06/17 ep  Substance and Sexual Activity  . Alcohol use: No    Alcohol/week: 0.0 oz  . Drug use: No  . Sexual activity: Yes    Birth control/protection: Surgical  Other Topics Concern  . Not on file  Social History Narrative  . Not on file    Past Surgical History:  Procedure Laterality Date  . CARPAL TUNNEL RELEASE Left   . CATARACT EXTRACTION W/PHACO Right 03/11/2016   Procedure: CATARACT EXTRACTION  PHACO AND INTRAOCULAR LENS PLACEMENT (IOC);  Surgeon: Gemma Payor, MD;  Location: [facility];  Service: Ophthalmology;  Laterality: Right;  CDE 4.24  . HERNIA REPAIR Bilateral   . KNEE ARTHROSCOPY WITH MEDIAL MENISECTOMY Left 04/22/2014   Procedure: LEFT KNEE ARTHROSCOPY WITH MEDIAL MENISECTOMY;  Surgeon: Vickki Hearing, MD;  Location: [facility];  Service: Orthopedics;  Laterality: Left;  . KNEE ARTHROSCOPY WITH MEDIAL MENISECTOMY Right 12/06/2015   Procedure: RIGHT KNEE ARTHROSCOPY WITH MEDIAL MENISECTOMY;  Surgeon: Vickki Hearing, MD;  Location: [facility];  Service: Orthopedics;  Laterality: Right;  . KNEE ARTHROSCOPY WITH MEDIAL MENISECTOMY Left 03/20/2017   Procedure: KNEE ARTHROSCOPY WITH MEDIAL  MENISECTOMY;  Surgeon: Vickki Hearing, MD;  Location: [facility];  Service: Orthopedics;  Laterality: Left;  . ORIF WRIST FRACTURE Left 12/19/2016   Procedure: OPEN REDUCTION INTERNAL FIXATION (ORIF) WRIST FRACTURE;  Surgeon: Betha Loa, MD;  Location: [facility];  Service: Orthopedics;  Laterality: Left;  accumed   . SHOULDER ARTHROCENTESIS Right   . VASECTOMY      Family History  Problem Relation Age of Onset  . Emphysema Maternal Grandfather   . Emphysema Maternal Grandmother   . Clotting disorder Maternal Grandmother     Allergies  Allergen Reactions  . Azithromycin Hives  . Erythromycin Hives  . Keflex [Cephalexin] Nausea Only  . Metformin Nausea And Vomiting  . Symbicort [Budesonide-Formoterol Fumarate] Other (See Comments)    States that 3 doses were used and breathing became worse    Current Outpatient Medications on File Prior to Visit  Medication Sig Dispense Refill  . albuterol (PROVENTIL) (2.5 MG/3ML) 0.083% nebulizer solution USE 1 VIAL IN NEBULIZER FOUR TIMES DAILY AS NEEDED FOR SHORTNESS OF BREATH 360 mL 0  . AMITIZA 24 MCG capsule TAKE ONE TABLET BY MOUTH TWICE A DAY 180 capsule 1  . atorvastatin (LIPITOR) 20 MG tablet Take 1 tablet (20 mg total) by mouth every evening. 90 tablet 3  . buPROPion (WELLBUTRIN SR) 150 MG 12 hr tablet Take 1 tablet (150 mg total) by mouth 2 (two) times daily. 180 tablet 1  . celecoxib (CELEBREX) 100 MG capsule Take 100 mg by mouth 2 (two) times daily.    Marland Kitchen CINNAMON PO Take 2,800 mg by mouth daily. EACH TABLET IS 1400 MG.  TAKES 2 CAPSULES DAILY    . citalopram (CELEXA) 40 MG tablet TAKE ONE (1) TABLET BY MOUTH EVERY DAY 30 tablet 11  . Continuous Blood Gluc Receiver (FREESTYLE LIBRE 14 DAY READER) DEVI 1 Device by Does not apply route 3 (three) times daily. 1 Device 0  . Continuous Blood Gluc Sensor (FREESTYLE LIBRE 14 DAY SENSOR) MISC 1 Device by Does not apply route 3 (three) times daily. 1 each 0  . diphenhydrAMINE  (BENADRYL) 25 MG tablet Take 25 mg by mouth 2 (two) times daily.    . empagliflozin (JARDIANCE) 10 MG TABS tablet Take 10 mg by mouth every morning. 90 tablet 2  . fluticasone furoate-vilanterol (BREO ELLIPTA) 200-25 MCG/INH AEPB Inhale 1 puff into the lungs daily. 30 each 11  . glipiZIDE (GLUCOTROL) 5 MG tablet TAKE TWO TABLETS BY MOUTH TWICE DAILY 360 tablet 1  . ipratropium (ATROVENT) 0.02 % nebulizer solution USE 1 VIAL IN NEBULIZER FOUR TIMES DAILY AS NEEDED 300 mL 0  . Ipratropium-Albuterol (COMBIVENT) 20-100 MCG/ACT AERS respimat Inhale 1 puff into the lungs every 6 (six) hours as needed for wheezing. 1 Inhaler 2  . lansoprazole (PREVACID) 30 MG capsule TAKE ONE  CAPSULE BY MOUTH TWICE A DAY BEFORE A MEAL 180 capsule 1  . metFORMIN (GLUCOPHAGE-XR) 500 MG 24 hr tablet TAKE 1 TABLET DAILY WITH BREAKFAST FOR 2WEEKS, THEN ADVANCE TO 2 TABLETS WITH BREAKFAST THEREAFTER 180 tablet 1  . Multiple Vitamin (MULTIVITAMIN WITH MINERALS) TABS Take 1 tablet by mouth every morning. Men's once daily multivitamin packet (vitamin e, calcium, ginseng)    . nabumetone (RELAFEN) 500 MG tablet TAKE ONE TABLET BY MOUTH TWICE A DAY 60 tablet 5  . Saw Palmetto, Serenoa repens, (SAW PALMETTO PO) Take 1 capsule by mouth daily.    . sildenafil (REVATIO) 20 MG tablet TAKE 2-3 TABLETS AS NEEDED (Patient taking differently: TAKE 3 TABLETS AS NEEDED) 20 tablet 11  . tamsulosin (FLOMAX) 0.4 MG CAPS capsule TAKE ONE (1) CAPSULE EACH DAY 90 capsule 1  . tapentadol (NUCYNTA) 50 MG tablet Take 50 mg by mouth 3 (three) times daily.    Marland Kitchen. testosterone (ANDROGEL) 50 MG/5GM (1%) GEL Place 5 g onto the skin daily.    Marland Kitchen. triamcinolone cream (KENALOG) 0.1 % Apply 1 application topically 2 (two) times daily. 45 g 0  . [DISCONTINUED] traZODone (DESYREL) 50 MG tablet TAKE ONE TO TWO TABLETS BY MOUTH AT BEDTIME AS NEEDED FOR SLEEP 90 tablet 1   No current facility-administered medications on file prior to visit.     BP 122/82   Pulse 83    Temp 98 F (36.7 C) (Oral)   Ht 5\' 10"  (1.778 m)   Wt 238 lb 1.9 oz (108 kg)   SpO2 97%   BMI 34.17 kg/m    Objective:   Physical Exam  Constitutional: He is oriented to person, place, and time. He appears well-nourished.  Neck: Neck supple.  Cardiovascular: Normal rate and regular rhythm.  Pulmonary/Chest: Effort normal and breath sounds normal. He has no wheezes. He has no rales.  Neurological: He is alert and oriented to person, place, and time.  Skin: Skin is warm and dry.  Numerous circular red patches to bilateral upper extremities and anterior trunk.  1/2 cm in diameter.  Nontender.  No drainage or scaling.          Assessment & Plan:  Rash:  Located to bilateral upper extremities and anterior trunk times several weeks. Appears to be fungal.  No improvement with triamcinolone cream. Prescription for clotrimazole sent to pharmacy. He will update if no improvement.  Morrie Sheldonlark,Mohammed Mcandrew Kendal, NP

## 2017-11-20 NOTE — Assessment & Plan Note (Signed)
Secondary to chronic left knee pain. Doing much better on Trazodone 150 mg, Rx sent to pharmacy.

## 2017-11-20 NOTE — Patient Instructions (Signed)
Start exercising. You should be getting 150 minutes of moderate intensity exercise weekly.  It's important to improve your diet by reducing consumption of fast food, fried food, processed snack foods, sugary drinks. Increase consumption of fresh vegetables and fruits, whole grains, water.  Ensure you are drinking 64 ounces of water daily.  Please schedule a follow up appointment in 6 months, this can be a physical or follow up appointment. Schedule a lab only appointment 2 days prior, make sure you are fasting.  It was a pleasure to see you today!   Diabetes Mellitus and Nutrition When you have diabetes (diabetes mellitus), it is very important to have healthy eating habits because your blood sugar (glucose) levels are greatly affected by what you eat and drink. Eating healthy foods in the appropriate amounts, at about the same times every day, can help you:  Control your blood glucose.  Lower your risk of heart disease.  Improve your blood pressure.  Reach or maintain a healthy weight.  Every person with diabetes is different, and each person has different needs for a meal plan. Your health care provider may recommend that you work with a diet and nutrition specialist (dietitian) to make a meal plan that is best for you. Your meal plan may vary depending on factors such as:  The calories you need.  The medicines you take.  Your weight.  Your blood glucose, blood pressure, and cholesterol levels.  Your activity level.  Other health conditions you have, such as heart or kidney disease.  How do carbohydrates affect me? Carbohydrates affect your blood glucose level more than any other type of food. Eating carbohydrates naturally increases the amount of glucose in your blood. Carbohydrate counting is a method for keeping track of how many carbohydrates you eat. Counting carbohydrates is important to keep your blood glucose at a healthy level, especially if you use insulin or take  certain oral diabetes medicines. It is important to know how many carbohydrates you can safely have in each meal. This is different for every person. Your dietitian can help you calculate how many carbohydrates you should have at each meal and for snack. Foods that contain carbohydrates include:  Bread, cereal, rice, pasta, and crackers.  Potatoes and corn.  Peas, beans, and lentils.  Milk and yogurt.  Fruit and juice.  Desserts, such as cakes, cookies, ice cream, and candy.  How does alcohol affect me? Alcohol can cause a sudden decrease in blood glucose (hypoglycemia), especially if you use insulin or take certain oral diabetes medicines. Hypoglycemia can be a life-threatening condition. Symptoms of hypoglycemia (sleepiness, dizziness, and confusion) are similar to symptoms of having too much alcohol. If your health care provider says that alcohol is safe for you, follow these guidelines:  Limit alcohol intake to no more than 1 drink per day for nonpregnant women and 2 drinks per day for men. One drink equals 12 oz of beer, 5 oz of wine, or 1 oz of hard liquor.  Do not drink on an empty stomach.  Keep yourself hydrated with water, diet soda, or unsweetened iced tea.  Keep in mind that regular soda, juice, and other mixers may contain a lot of sugar and must be counted as carbohydrates.  What are tips for following this plan? Reading food labels  Start by checking the serving size on the label. The amount of calories, carbohydrates, fats, and other nutrients listed on the label are based on one serving of the food. Many foods contain  more than one serving per package.  Check the total grams (g) of carbohydrates in one serving. You can calculate the number of servings of carbohydrates in one serving by dividing the total carbohydrates by 15. For example, if a food has 30 g of total carbohydrates, it would be equal to 2 servings of carbohydrates.  Check the number of grams (g) of  saturated and trans fats in one serving. Choose foods that have low or no amount of these fats.  Check the number of milligrams (mg) of sodium in one serving. Most people should limit total sodium intake to less than 2,300 mg per day.  Always check the nutrition information of foods labeled as "low-fat" or "nonfat". These foods may be higher in added sugar or refined carbohydrates and should be avoided.  Talk to your dietitian to identify your daily goals for nutrients listed on the label. Shopping  Avoid buying canned, premade, or processed foods. These foods tend to be high in fat, sodium, and added sugar.  Shop around the outside edge of the grocery store. This includes fresh fruits and vegetables, bulk grains, fresh meats, and fresh dairy. Cooking  Use low-heat cooking methods, such as baking, instead of high-heat cooking methods like deep frying.  Cook using healthy oils, such as olive, canola, or sunflower oil.  Avoid cooking with butter, cream, or high-fat meats. Meal planning  Eat meals and snacks regularly, preferably at the same times every day. Avoid going long periods of time without eating.  Eat foods high in fiber, such as fresh fruits, vegetables, beans, and whole grains. Talk to your dietitian about how many servings of carbohydrates you can eat at each meal.  Eat 4-6 ounces of lean protein each day, such as lean meat, chicken, fish, eggs, or tofu. 1 ounce is equal to 1 ounce of meat, chicken, or fish, 1 egg, or 1/4 cup of tofu.  Eat some foods each day that contain healthy fats, such as avocado, nuts, seeds, and fish. Lifestyle   Check your blood glucose regularly.  Exercise at least 30 minutes 5 or more days each week, or as told by your health care provider.  Take medicines as told by your health care provider.  Do not use any products that contain nicotine or tobacco, such as cigarettes and e-cigarettes. If you need help quitting, ask your health care  provider.  Work with a Veterinary surgeon or diabetes educator to identify strategies to manage stress and any emotional and social challenges. What are some questions to ask my health care provider?  Do I need to meet with a diabetes educator?  Do I need to meet with a dietitian?  What number can I call if I have questions?  When are the best times to check my blood glucose? Where to find more information:  American Diabetes Association: diabetes.org/food-and-fitness/food  Academy of Nutrition and Dietetics: https://www.vargas.com/  [employer] of Diabetes and Digestive and Kidney Diseases (NIH): FindJewelers.cz Summary  A healthy meal plan will help you control your blood glucose and maintain a healthy lifestyle.  Working with a diet and nutrition specialist (dietitian) can help you make a meal plan that is best for you.  Keep in mind that carbohydrates and alcohol have immediate effects on your blood glucose levels. It is important to count carbohydrates and to use alcohol carefully. This information is not intended to replace advice given to you by your health care provider. Make sure you discuss any questions you have with your health  care provider. Document Released: 08/15/2005 Document Revised: 12/23/2016 Document Reviewed: 12/23/2016 Elsevier Interactive Patient Education  [employer]2018 [employer] Inc.

## 2017-11-20 NOTE — Assessment & Plan Note (Signed)
Recent A1C of 8.3, same as prior three months. Highly suspect this is secondary to poor diet and frequent cortisone injections. Will have him continue all medications, work on diet. Surgery is scheduled on 12/30/17, should only have 2 additional injections.   Follow up in 6 months.

## 2017-11-21 ENCOUNTER — Encounter: Payer: Self-pay | Admitting: Primary Care

## 2017-11-21 DIAGNOSIS — M25462 Effusion, left knee: Secondary | ICD-10-CM | POA: Diagnosis not present

## 2017-11-26 ENCOUNTER — Emergency Department (HOSPITAL_COMMUNITY)
Admission: EM | Admit: 2017-11-26 | Discharge: 2017-11-26 | Disposition: A | Discharge status: Home / Self Care | Payer: Medicare Other | Attending: Emergency Medicine | Admitting: Emergency Medicine

## 2017-11-26 ENCOUNTER — Encounter (HOSPITAL_COMMUNITY): Payer: Self-pay | Admitting: *Deleted

## 2017-11-26 ENCOUNTER — Other Ambulatory Visit: Payer: Self-pay

## 2017-11-26 DIAGNOSIS — E119 Type 2 diabetes mellitus without complications: Secondary | ICD-10-CM | POA: Diagnosis not present

## 2017-11-26 DIAGNOSIS — Z7984 Long term (current) use of oral hypoglycemic drugs: Secondary | ICD-10-CM | POA: Diagnosis not present

## 2017-11-26 DIAGNOSIS — J45909 Unspecified asthma, uncomplicated: Secondary | ICD-10-CM | POA: Insufficient documentation

## 2017-11-26 DIAGNOSIS — J449 Chronic obstructive pulmonary disease, unspecified: Secondary | ICD-10-CM | POA: Diagnosis not present

## 2017-11-26 DIAGNOSIS — G8929 Other chronic pain: Secondary | ICD-10-CM | POA: Diagnosis not present

## 2017-11-26 DIAGNOSIS — M25562 Pain in left knee: Secondary | ICD-10-CM | POA: Insufficient documentation

## 2017-11-26 DIAGNOSIS — F17210 Nicotine dependence, cigarettes, uncomplicated: Secondary | ICD-10-CM | POA: Insufficient documentation

## 2017-11-26 MED ORDER — OXYCODONE-ACETAMINOPHEN 7.5-325 MG PO TABS: 1.0000 | ORAL_TABLET | Freq: Three times a day (TID) | ORAL | 0 refills | Status: DC | PRN

## 2017-11-26 MED ORDER — OXYCODONE-ACETAMINOPHEN 5-325 MG PO TABS
2.0000 | ORAL_TABLET | Freq: Once | ORAL | Status: AC
  Administered 2017-11-26: 2 via ORAL
  Filled 2017-11-26: qty 2

## 2017-11-26 NOTE — ED Notes (Signed)
Patient verbalized understanding of discharge instructions and denies any further needs or questions at this time. VS stable. Patient assisted to ED entrance in wheelchair.   

## 2017-11-26 NOTE — ED Provider Notes (Signed)
[facility]   CSN: [id number] Arrival date & time: 11/26/17  2016     History   Chief Complaint Chief Complaint  Patient presents with  . Knee Pain    HPI Joseph [name] is a 46 y.o. male with history of anxiety, arthritis, chronic back pain, chronic left knee pain, COPD, DM, GERD presents with chief complaint acute on chronic worsening of left knee pain for 1 day.  Patient states that he has been receiving treatment for his left knee pain for several months.  He states that he underwent arthroscopic procedure in January, followed by arthrocentesis every 3 weeks with injection of corticosteroid at that time. He is a patient of Dr. Renaye Rakersim Murphy with orthopedics, who is recommending a partial knee replacement.  Patient states that surgery has been postponed due to his diabetes.  He states that today his chronic knee pain has worsened to the point that he can barely ambulate on it.  Pain is sharp and stabbing, primarily localized to the medial aspect of the left knee with radiation down to the calf and up to the thigh.  He states this feels like his usual pain.  He also notes worsening swelling which occurs with his knee pain intermittently.  He denies numbness or weakness, fevers, chills.  He states that he was in a pain management clinic in [place] which has "gone out of business ".  He received 45 tablets of Nucynta 50 mg on 11/07/17, which he states he is out of.  This was a 15-day supply.  He has not tried anything else for his symptoms.  He states that he is in the process of establishing care with a new pain management clinic in [place], but has to undergo a psychiatric evaluation first.  The history is provided by the patient.    Past Medical History:  Diagnosis Date  . Anxiety   . Arthritis   . Asthma   . Chronic back pain   . Chronic pain of left knee   . COPD (chronic obstructive pulmonary disease) (HCC)   . Diabetes  mellitus   . GERD (gastroesophageal reflux disease)   . Shortness of breath   . Sleep apnea     Patient Active Problem List   Diagnosis Date Noted  . Insomnia 11/20/2017  . Disorder of bone, unspecified 11/05/2017  . Other long term (current) drug therapy 11/05/2017  . Other specified health status 11/05/2017  . Chronic knee pain 11/05/2017  . Wrist pain, chronic, left 11/05/2017  . Chronic constipation 08/18/2017  . Hyperlipidemia 05/12/2017  . COPD without exacerbation (HCC) 05/07/2017  . Post-void dribbling 04/01/2017  . Primary osteoarthritis of left knee   . Skin lesions 03/12/2017  . GERD (gastroesophageal reflux disease) 03/12/2017  . Bucket handle tear of meniscus of right knee   . Anxiety and depression 10/07/2015  . Morbid obesity (HCC) 10/06/2015  . OSA (obstructive sleep apnea) 08/31/2014  . Testosterone deficiency 07/01/2014  . S/P knee surgery 04/26/2014  . Acute meniscal tear of left knee 04/26/2014  . Medial meniscus, posterior horn derangement 04/22/2014  . Intrinsic asthma 12/19/2013  . Chronic pain syndrome 05/13/2013  . Diabetes (HCC) 05/13/2013  . Mediastinal abnormality 04/10/2012  . Cigarette smoker 04/10/2012  . INGUINAL PAIN, LEFT 10/03/2009    Past Surgical History:  Procedure Laterality Date  . CARPAL TUNNEL RELEASE Left   . CATARACT EXTRACTION W/PHACO Right 03/11/2016   Procedure: CATARACT EXTRACTION PHACO AND INTRAOCULAR  LENS PLACEMENT (IOC);  Surgeon: Gemma Payor, MD;  Location: [facility];  Service: Ophthalmology;  Laterality: Right;  CDE 4.24  . HERNIA REPAIR Bilateral   . KNEE ARTHROSCOPY WITH MEDIAL MENISECTOMY Left 04/22/2014   Procedure: LEFT KNEE ARTHROSCOPY WITH MEDIAL MENISECTOMY;  Surgeon: Vickki Hearing, MD;  Location: [facility];  Service: Orthopedics;  Laterality: Left;  . KNEE ARTHROSCOPY WITH MEDIAL MENISECTOMY Right 12/06/2015   Procedure: RIGHT KNEE ARTHROSCOPY WITH MEDIAL MENISECTOMY;  Surgeon: Vickki Hearing, MD;  Location: [facility];  Service: Orthopedics;  Laterality: Right;  . KNEE ARTHROSCOPY WITH MEDIAL MENISECTOMY Left 03/20/2017   Procedure: KNEE ARTHROSCOPY WITH MEDIAL MENISECTOMY;  Surgeon: Vickki Hearing, MD;  Location: [facility];  Service: Orthopedics;  Laterality: Left;  . ORIF WRIST FRACTURE Left 12/19/2016   Procedure: OPEN REDUCTION INTERNAL FIXATION (ORIF) WRIST FRACTURE;  Surgeon: Betha Loa, MD;  Location: [facility];  Service: Orthopedics;  Laterality: Left;  accumed   . SHOULDER ARTHROCENTESIS Right   . VASECTOMY         Home Medications    Prior to Admission medications   Medication Sig Start Date End Date Taking? Authorizing Provider  albuterol (PROVENTIL) (2.5 MG/3ML) 0.083% nebulizer solution USE 1 VIAL IN NEBULIZER FOUR TIMES DAILY AS NEEDED FOR SHORTNESS OF BREATH 04/21/17   Doreene Nest, NP  AMITIZA 24 MCG capsule TAKE ONE TABLET BY MOUTH TWICE A DAY 08/06/17   Doreene Nest, NP  atorvastatin (LIPITOR) 20 MG tablet Take 1 tablet (20 mg total) by mouth every evening. 05/12/17   Doreene Nest, NP  buPROPion (WELLBUTRIN SR) 150 MG 12 hr tablet Take 1 tablet (150 mg total) by mouth 2 (two) times daily. 08/05/17   Doreene Nest, NP  celecoxib (CELEBREX) 100 MG capsule Take 100 mg by mouth 2 (two) times daily.    [provider]  CINNAMON PO Take 2,800 mg by mouth daily. EACH TABLET IS 1400 MG.  TAKES 2 CAPSULES DAILY    [provider]  citalopram (CELEXA) 40 MG tablet TAKE ONE (1) TABLET BY MOUTH EVERY DAY 04/10/17   Doreene Nest, NP  clotrimazole (LOTRIMIN) 1 % cream Apply 1 application topically 2 (two) times daily. 11/20/17   Doreene Nest, NP  Continuous Blood Gluc Receiver (FREESTYLE LIBRE 14 DAY READER) DEVI 1 Device by Does not apply route 3 (three) times daily. 10/21/17   Doreene Nest, NP  Continuous Blood Gluc Sensor (FREESTYLE LIBRE 14 DAY SENSOR) MISC 1 Device by Does not apply route 3 (three) times daily. 10/21/17    Doreene Nest, NP  diphenhydrAMINE (BENADRYL) 25 MG tablet Take 25 mg by mouth 2 (two) times daily.    [provider]  empagliflozin (JARDIANCE) 10 MG TABS tablet Take 10 mg by mouth every morning. 09/09/17   Doreene Nest, NP  fluticasone furoate-vilanterol (BREO ELLIPTA) 200-25 MCG/INH AEPB Inhale 1 puff into the lungs daily. 05/23/17   Nyoka Cowden, MD  glipiZIDE (GLUCOTROL) 5 MG tablet TAKE TWO TABLETS BY MOUTH TWICE DAILY 10/07/17   Doreene Nest, NP  ipratropium (ATROVENT) 0.02 % nebulizer solution USE 1 VIAL IN NEBULIZER FOUR TIMES DAILY AS NEEDED 04/21/17   Doreene Nest, NP  Ipratropium-Albuterol (COMBIVENT) 20-100 MCG/ACT AERS respimat Inhale 1 puff into the lungs every 6 (six) hours as needed for wheezing. 05/23/17   Nyoka Cowden, MD  lansoprazole (PREVACID) 30 MG capsule TAKE ONE CAPSULE BY MOUTH TWICE A DAY  BEFORE A MEAL 06/23/17   Doreene Nest, NP  metFORMIN (GLUCOPHAGE-XR) 500 MG 24 hr tablet TAKE 1 TABLET DAILY WITH BREAKFAST FOR 2WEEKS, THEN ADVANCE TO 2 TABLETS WITH BREAKFAST THEREAFTER 11/14/17   Doreene Nest, NP  Multiple Vitamin (MULTIVITAMIN WITH MINERALS) TABS Take 1 tablet by mouth every morning. Men's once daily multivitamin packet (vitamin e, calcium, ginseng)    [provider]  nabumetone (RELAFEN) 500 MG tablet TAKE ONE TABLET BY MOUTH TWICE A DAY 06/24/17   Vickki Hearing, MD  oxyCODONE-acetaminophen (PERCOCET) 7.5-325 MG tablet Take 1 tablet by mouth every 8 (eight) hours as needed for severe pain. 11/26/17   Ines Rebel A, PA-C  Saw Palmetto, Serenoa repens, (SAW PALMETTO PO) Take 1 capsule by mouth daily.    [provider]  sildenafil (REVATIO) 20 MG tablet TAKE 2-3 TABLETS AS NEEDED Patient taking differently: TAKE 3 TABLETS AS NEEDED 11/26/16   Merlyn Albert, MD  tamsulosin (FLOMAX) 0.4 MG CAPS capsule TAKE ONE (1) CAPSULE EACH DAY 11/14/17   Doreene Nest, NP  tapentadol (NUCYNTA) 50 MG  tablet Take 50 mg by mouth 3 (three) times daily.    [provider]  testosterone (ANDROGEL) 50 MG/5GM (1%) GEL Place 5 g onto the skin daily.    [provider]  traZODone (DESYREL) 150 MG tablet Take 1 tablet (150 mg total) by mouth at bedtime. 11/20/17   Doreene Nest, NP  triamcinolone cream (KENALOG) 0.1 % Apply 1 application topically 2 (two) times daily. 04/10/17   Doreene Nest, NP    Family History Family History  Problem Relation Age of Onset  . Emphysema Maternal Grandfather   . Emphysema Maternal Grandmother   . Clotting disorder Maternal Grandmother     Social History Social History   Tobacco Use  . Smoking status: Current Every Day Smoker    Packs/day: 1.50    Years: 20.00    Pack years: 30.00    Types: Cigarettes    Start date: 12/17/1993  . Smokeless tobacco: Former Neurosurgeon  . Tobacco comment: 1ppd as of 11/06/17 ep  Substance Use Topics  . Alcohol use: No    Alcohol/week: 0.0 oz  . Drug use: No     Allergies   Azithromycin; Erythromycin; Keflex [cephalexin]; Metformin; and Symbicort [budesonide-formoterol fumarate]   Review of Systems Review of Systems  Constitutional: Negative for chills and fever.  Musculoskeletal: Positive for arthralgias (L knee).  Skin: Negative for color change.     Physical Exam Updated Vital Signs BP 131/82 (BP Location: Right Arm)   Pulse 83   Temp 98.1 F (36.7 C) (Oral)   Resp 18   Ht 5\' 10"  (1.778 m)   Wt 107 kg (236 lb)   SpO2 99%   BMI 33.86 kg/m   Physical Exam  Constitutional: He appears well-developed and well-nourished. No distress.  HENT:  Head: Normocephalic and atraumatic.  Eyes: Conjunctivae are normal. Right eye exhibits no discharge. Left eye exhibits no discharge.  Neck: No JVD present. No tracheal deviation present.  Cardiovascular: Normal rate and intact distal pulses.  2+ DP/PT pulses bilaterally, no lower extremity edema, Homans sign absent bilateral    Pulmonary/Chest: Effort normal.  Abdominal: He exhibits no distension.  Musculoskeletal: He exhibits tenderness. He exhibits no edema.  Slightly decreased range of motion of the left knee with flexion.  Maximally tender to palpation overlying the medial joint line.  Negative anterior/posterior drawer test.  No varus or valgus instability.  5/5 strength of BLE major muscle groups.  Mild swelling of the knee anteriorly and superiorly.  He is able to extend the knee against gravity and there does not appear to be a quadriceps tendon deformity.  No erythema, crepitus, or deformity noted.  Neurological: He is alert.  Fluent speech, no facial droop, sensation intact to soft touch of bilateral lower extremity's.  Antalgic gait.  Skin: Skin is warm and dry. No erythema.  Psychiatric: He has a normal mood and affect. His behavior is normal.  Nursing note and vitals reviewed.    ED Treatments / Results  Labs (all labs ordered are listed, but only abnormal results are displayed) Labs Reviewed - No data to display  EKG  EKG Interpretation None       Radiology No results found.  Procedures Procedures (including critical care time)  Medications Ordered in ED Medications  oxyCODONE-acetaminophen (PERCOCET/ROXICET) 5-325 MG per tablet 2 tablet (2 tablets Oral Given 11/26/17 2157)     Initial Impression / Assessment and Plan / ED Course  I have reviewed the triage vital signs and the nursing notes.  Pertinent labs & imaging results that were available during my care of the patient were reviewed by me and considered in my medical decision making (see chart for details).     Patient presents with acute on chronic worsening of left knee pain.  He is attempting to establish follow-up with a new pain management clinic as his is now out of business.  [place] controlled substance [place] shows that he has been receiving Nucynta regularly since May and was on oxycodone prior to that.  He  has no early refills and receives prescriptions from the same provider and feels at the same pharmacy.  Spoke with attending Dr. Jacqulyn BathLong, and feel it is reasonable to send the patient home with a small amount of Percocet.  Patient states he will undergo his psychiatric evaluation for establishment at the pain clinic tomorrow.  No evidence of septic joint, gout, or osteomyelitis.  Doubt fracture or dislocation in the absence of trauma.  We will also attempt to get the patient a walker which he thinks will be helpful to him.  Discussed the utility of RICE therapy.  Patient will follow up with Dr. Eulah PontMurphy for reevaluation.  Discussed indications for return to the ED.  Patient and patient's fianc verbalized understanding of and agreement with plan and patient is stable for discharge home at this time.  Final Clinical Impressions(s) / ED Diagnoses   Final diagnoses:  Chronic pain of left knee    ED Discharge Orders        Ordered    oxyCODONE-acetaminophen (PERCOCET) 7.5-325 MG tablet  Every 8 hours PRN     11/26/17 2155       Jeanie SewerFawze, Saburo Luger A, PA-C 11/26/17 2221    Maia PlanLong, Joshua G, MD 11/26/17 2355

## 2017-11-26 NOTE — Discharge Instructions (Signed)
Follow-up with pain management for reevaluation.  Follow-up with orthopedic physician for reevaluation.  Return to the ED for any concerning signs or symptoms develop.

## 2017-11-26 NOTE — ED Triage Notes (Signed)
Chronic left knee pain, scheduled for surgery at the end of January. Had injection on Friday. Says that pain is "really bad today, like I am going to pass out" no new injury

## 2017-12-02 ENCOUNTER — Encounter: Payer: Self-pay | Admitting: Primary Care

## 2017-12-02 DIAGNOSIS — R21 Rash and other nonspecific skin eruption: Secondary | ICD-10-CM

## 2017-12-03 DIAGNOSIS — H25812 Combined forms of age-related cataract, left eye: Secondary | ICD-10-CM | POA: Diagnosis not present

## 2017-12-03 DIAGNOSIS — H2512 Age-related nuclear cataract, left eye: Secondary | ICD-10-CM | POA: Diagnosis not present

## 2017-12-03 MED ORDER — CICLOPIROX OLAMINE 0.77 % EX CREA: TOPICAL_CREAM | Freq: Two times a day (BID) | CUTANEOUS | 0 refills | Status: DC

## 2017-12-08 DIAGNOSIS — G4733 Obstructive sleep apnea (adult) (pediatric): Secondary | ICD-10-CM | POA: Diagnosis not present

## 2017-12-08 DIAGNOSIS — Z79899 Other long term (current) drug therapy: Secondary | ICD-10-CM | POA: Insufficient documentation

## 2017-12-08 DIAGNOSIS — Z789 Other specified health status: Secondary | ICD-10-CM | POA: Insufficient documentation

## 2017-12-08 DIAGNOSIS — Z79891 Long term (current) use of opiate analgesic: Secondary | ICD-10-CM | POA: Insufficient documentation

## 2017-12-08 DIAGNOSIS — F1190 Opioid use, unspecified, uncomplicated: Secondary | ICD-10-CM | POA: Insufficient documentation

## 2017-12-08 DIAGNOSIS — Z0001 Encounter for general adult medical examination with abnormal findings: Secondary | ICD-10-CM | POA: Insufficient documentation

## 2017-12-08 NOTE — Progress Notes (Addendum)
Patient's Name: Joseph [name]  MRN: [id number]  Referring Provider: Pleas Koch, NP  DOB: October 05, 1971  PCP: Pleas Koch, NP  DOS: 12/10/2017  Note by: Gaspar Cola, MD  Service setting: Ambulatory outpatient  Specialty: Interventional Pain Management  Location: [facility] (AMB) Pain Management Facility    Patient type: Established   Primary Reason(s) for Visit: Encounter for evaluation before starting new chronic pain management plan of care (Level of risk: moderate) CC: Knee Pain (left.  )  HPI  Joseph [name] is a 47 y.o. year old, male patient, who comes today for a follow-up evaluation to review the test results and decide on a treatment plan. He has INGUINAL PAIN, LEFT; Mediastinal abnormality; Cigarette smoker; Chronic pain syndrome; Diabetes ([place]); Intrinsic asthma; Medial meniscus, posterior horn derangement; S/P knee surgery; Acute meniscal tear of left knee; Testosterone deficiency; OSA (obstructive sleep apnea); Morbid obesity ([place]); Anxiety and depression; Bucket handle tear of meniscus of right knee; Skin lesions; GERD (gastroesophageal reflux disease); Osteoarthritis of the knee (Left); Post-void dribbling; COPD without exacerbation ([place]); Hyperlipidemia; Chronic constipation; Disorder of skeletal system; Other long term (current) drug therapy; Other specified health status; Chronic knee pain (Primary Area of Pain) (Bilateral) (L>R); Chronic wrist pain (Secondary Area of Pain) (Left); Insomnia; Male hypogonadism; Pharmacologic therapy; Problems influencing health status; Long term prescription opiate use; and Opiate use on their problem list. His primarily concern today is the Knee Pain (left.  )  Pain Assessment: Location: Left Knee Radiating: down the left lower leg  Onset: More than a month ago Duration: Chronic pain Quality: Tingling, Constant, Burning(weakness) Severity: 5 /10 (self-reported pain score)  Note: Reported level is inconsistent with clinical observations.  Clinically the patient looks like a 2/10 A 2/10 is viewed as "Mild to Moderate" and described as noticeable and distracting. Impossible to hide from other people. More frequent flare-ups. Still possible to adapt and function close to normal. It can be very annoying and may have occasional stronger flare-ups. With discipline, patients may get used to it and adapt. Information on the proper use of the pain scale provided to the patient today. When using our objective Pain Scale, levels between 6 and 10/10 are said to belong in an emergency room, as it progressively worsens from a 6/10, described as severely limiting, requiring emergency care not usually available at an outpatient pain management facility. At a 6/10 level, communication becomes difficult and requires great effort. Assistance to reach the emergency department may be required. Facial flushing and profuse sweating along with potentially dangerous increases in heart rate and blood pressure will be evident. Effect on ADL: mobility is affected and occassionally the knee will go out from under him.  is supposed to have surgery, however was cancelled d/t A1C being too high Timing: Constant Modifying factors: medications  Joseph [name] comes in today for a follow-up visit after his initial evaluation on 11/05/2017. Today we went over the results of his tests. These were explained in "Layman's terms". During today's appointment we went over my diagnostic impression, as well as the proposed treatment plan.  According to the patient the patient his primary area of pain is in his left knee. He admits that he is S/P left knee arthroscopic surgery x 2; 2015 and 2018. He is awaiting partial knee replacement . He is S/P left knee NB at [facility] in [place], around April 2018, but states this was not effective. Further questioning revealed 100% x 4-6 hrs. He denies any physical therapy.  He has recently completed an MRI. Describes having had  knee drained and injected with "Cortisone", monthly, for the past 12 months. These would help for 2-3 days. Synvisc x 3 tried on the summer of 2018. No benefit according to him.  His second area of pain is in his left wrsit. He states that he fell of a ladder March 2018 on to his wrist. He is S/P surgery. He denies any physical therapy or interventional procedures.   He admits that he used some Hemp oil once on Saturday. He indicates being here for medication management until his surgery.  In considering the treatment plan options, Joseph [name] was reminded that I no longer take patients for medication management only. I asked him to let me know if he had no intention of taking advantage of the interventional therapies, so that we could make arrangements to provide this space to someone interested. I also made it clear that undergoing interventional therapies for the purpose of getting pain medications is very inappropriate on the part of a patient, and it will not be tolerated in this practice. This type of behavior would suggest true addiction and therefore it requires referral to an addiction specialist.   Further details on both, my assessment(s), as well as the proposed treatment plan, please see below.  Controlled Substance Pharmacotherapy Assessment REMS (Risk Evaluation and Mitigation Strategy)  Analgesic: Nucynta 50 mg 3 times daily (fill date 09/29/2017) (150 mg/day of Nucynta); oxycodone/APAP 7.5/325 (field on 11/27/2017) Highest recorded MME/day: 90 mg/day MME/day: 60 mg/day Pill Count: None expected due to no prior prescriptions written by our practice. No notes on file Pharmacokinetics: Liberation and absorption (onset of action): WNL Distribution (time to peak effect): WNL Metabolism and excretion (duration of action): WNL         Pharmacodynamics: Desired effects: Analgesia: Joseph [name] reports >67% benefit. Functional ability: Patient reports that medication allows him to  accomplish basic ADLs Clinically meaningful improvement in function (CMIF): Sustained CMIF goals met Perceived effectiveness: Described as relatively effective, allowing for increase in activities of daily living (ADL) Undesirable effects: Side-effects or Adverse reactions: None reported Monitoring: [place] PMP: Online review of the past 4-monthperiod previously conducted. Not applicable at this point since we have not taken over the patient's medication management yet. List of other Serum/Urine Drug Screening Test(s):  No results found for: AMPHSCRSER, BSharp BENZOSCRSER, COCAINSCRSER, COCAINSCRNUR, [place] EPiersonList of all UDS test(s) done:  Lab Results  Component Value Date   TOXASSSELUR FINAL 10/29/2016   TSt. AnthonyFINAL 05/23/2015   SUMMARY FINAL 11/05/2017   Last UDS on record: ToxAssure Select 13  Date Value Ref Range Status  10/29/2016 FINAL  Final    Comment:    ==================================================================== TOXASSURE SELECT 13 (MW) ==================================================================== Test                             Result       Flag       Units Drug Present and Declared for Prescription Verification   Alprazolam                     137          EXPECTED   ng/mg creat   Alpha-hydroxyalprazolam        168          EXPECTED   ng/mg creat    Source of alprazolam is a  scheduled prescription medication.    Alpha-hydroxyalprazolam is an expected metabolite of alprazolam. Drug Absent but Declared for Prescription Verification   Hydrocodone                    Not Detected UNEXPECTED ng/mg creat ==================================================================== Test                      Result    Flag   Units      Ref Range   Creatinine              41               mg/dL      >=20 ==================================================================== Declared  Medications:  The flagging and interpretation on this report are based on the  following declared medications.  Unexpected results may arise from  inaccuracies in the declared medications.  **Note: The testing scope of this panel includes these medications:  Alprazolam  Hydrocodone  **Note: The testing scope of this panel does not include following  reported medications:  Bupropion (Wellbutrin) ==================================================================== For clinical consultation, please call [phone]. ====================================================================    Summary  Date Value Ref Range Status  11/05/2017 FINAL  Final    Comment:    ==================================================================== TOXASSURE COMP DRUG ANALYSIS,UR ==================================================================== Test                             Result       Flag       Units Drug Present and Declared for Prescription Verification   Tapentadol                     11414        EXPECTED   ng/mg creat    Source of tapentadol is a scheduled prescription medication.   Bupropion                      PRESENT      EXPECTED   Hydroxybupropion               PRESENT      EXPECTED    Hydroxybupropion is an expected metabolite of bupropion.   Citalopram                     PRESENT      EXPECTED   Desmethylcitalopram            PRESENT      EXPECTED    Desmethylcitalopram is an expected metabolite of citalopram or    the enantiomeric form, escitalopram.   Trazodone                      PRESENT      EXPECTED   1,3 chlorophenyl piperazine    PRESENT      EXPECTED    1,3-chlorophenyl piperazine is an expected metabolite of    trazodone.   Diphenhydramine                PRESENT      EXPECTED Drug Absent but Declared for Prescription Verification   Alprazolam                     Not Detected UNEXPECTED ng/mg  creat ==================================================================== Test  Result    Flag   Units      Ref Range   Creatinine              66               mg/dL      >=20 ==================================================================== Declared Medications:  The flagging and interpretation on this report are based on the  following declared medications.  Unexpected results may arise from  inaccuracies in the declared medications.  **Note: The testing scope of this panel includes these medications:  Alprazolam (Xanax)  Bupropion (Wellbutrin)  Citalopram  Diphenhydramine (Benadryl)  Tapentadol  Trazodone  **Note: The testing scope of this panel does not include following  reported medications:  Albuterol (Combivent)  Albuterol (Proventil)  Atorvastatin (Lipitor)  Cinnamon Bark  Empagliflozin (Jardiance)  Fluticasone (Breo)  Glipizide  Ipratropium  Ipratropium (Combivent)  Lansoprazole (Prevacid)  Lubiprostone (Amitiza)  Metformin (Glucophage)  Multivitamin  Nabumetone (Relafen)  Sildenafil  Supplement ([place]metto)  Tamsulosin  Testosterone  Triamcinolone  Vilanterol (Breo) ==================================================================== For clinical consultation, please call [phone]. ====================================================================    UDS interpretation: No unexpected findings.          Medication Assessment Form: Patient introduced to form today Treatment compliance: Treatment may start today if patient agrees with proposed plan. Evaluation of compliance is not applicable at this point Risk Assessment Profile: Aberrant behavior: See initial evaluations. None observed or detected today Comorbid factors increasing risk of overdose: See initial evaluation. No additional risks detected today Medical Psychology Evaluation: Please see scanned results in medical record.   Opioid Risk Tool - 11/05/17 1008       Family History of Substance Abuse   Alcohol  Positive Male dad   dad   Illegal Drugs  Negative    Rx Drugs  Negative      Personal History of Substance Abuse   Alcohol  Negative    Illegal Drugs  Negative    Rx Drugs  Negative      Age   Age between 48-45 years   No      History of Preadolescent Sexual Abuse   History of Preadolescent Sexual Abuse  Negative or Male      Psychological Disease   Psychological Disease  Negative    Depression  Positive      Total Score   Opioid Risk Tool Scoring  4    Opioid Risk Interpretation  Moderate Risk      ORT Scoring interpretation table:  Score <3 = Low Risk for SUD  Score between 4-7 = Moderate Risk for SUD  Score >8 = High Risk for Opioid Abuse   Risk Mitigation Strategies:  Patient opioid safety counseling: Completed today. Counseling provided to patient as per "Patient Counseling Document". Document signed by patient, attesting to counseling and understanding Patient-Prescriber Agreement (PPA): Obtained today.  Controlled substance notification to other providers: Written and sent today.  Pharmacologic Plan: Today we may be taking over the patient's pharmacological regimen. See below.             Laboratory Chemistry  Inflammation Markers (CRP: Acute Phase) (ESR: Chronic Phase) Lab Results  Component Value Date   CRP 0.7 11/05/2017   ESRSEDRATE 11 11/05/2017   LATICACIDVEN 2.1 01/24/2013                 Renal Function Markers Lab Results  Component Value Date   BUN 10 11/05/2017   CREATININE 1.03 11/05/2017   GFRAA 100  11/05/2017   GFRNONAA 86 11/05/2017                 Hepatic Function Markers Lab Results  Component Value Date   AST 13 11/05/2017   ALT 19 05/08/2017   ALBUMIN 4.8 11/05/2017   ALKPHOS 100 11/05/2017   LIPASE 38 01/24/2013                 Electrolytes Lab Results  Component Value Date   NA 138 11/05/2017   K 4.6 11/05/2017   CL 97 11/05/2017   CALCIUM 9.6 11/05/2017   MG 2.1  11/05/2017                 Neuropathy Markers Lab Results  Component Value Date   VITAMINB12 341 11/05/2017   HGBA1C 8.3 (H) 11/17/2017                 Bone Pathology Markers Lab Results  Component Value Date   25OHVITD1 42 11/05/2017   25OHVITD2 <1.0 11/05/2017   25OHVITD3 42 11/05/2017   TESTOSTERONE 208.98 (L) 04/17/2017                 Coagulation Parameters Lab Results  Component Value Date   PLT 214 03/17/2017   DDIMER 0.46 03/07/2012                 Cardiovascular Markers Lab Results  Component Value Date   TROPONINI <0.30 12/09/2013   HGB 14.0 03/17/2017   HCT 41.1 03/17/2017                 Note: Lab results reviewed.  Recent Diagnostic Imaging Review  Lumbosacral Imaging: Lumbar MR wo contrast:  Results for orders placed during the hospital encounter of 09/03/16  MR Lumbar Spine Wo Contrast   Narrative CLINICAL DATA:  47 y/o M; lower back pain that extends throughout the left leg with numbness and weakness for the past 3 months.  EXAM: MRI LUMBAR SPINE WITHOUT CONTRAST  TECHNIQUE: Multiplanar, multisequence MR imaging of the lumbar spine was performed. No intravenous contrast was administered.  COMPARISON:  08/20/2016 lumbar radiographs. 01/24/2013 CT abdomen and pelvis.  FINDINGS: Segmentation:  Standard.  Alignment:  Physiologic.  Vertebrae: No fracture, evidence of discitis, or bone lesion. Mild degenerative edema within the L1 and L2 endplates. 13 mm L2 vertebral body T1 and T2 hyperintense focus is likely a hemangioma  Conus medullaris: Extends to the L1-2 level and appears normal.  Paraspinal and other soft tissues: Negative.  Disc levels:  There is disc desiccation and disc space narrowing at T11-12 and L3-5.  L1-2: No significant disc displacement, foraminal narrowing, or canal stenosis.  L2-3: Small disc bulge eccentric to the left. No significant canal stenosis or foraminal narrowing.  L3-4: Small disc bulge and mild  facet hypertrophy. No significant canal stenosis or foraminal narrowing.  L4-5: Moderate disc bulge eccentric to the left foraminal and far lateral zone and moderate facet/ligamentum flavum hypertrophy. Mild right and moderate to severe left foraminal narrowing. Mild canal stenosis. Effacement of the lateral recesses bilaterally.  L5-S1: Small disc bulge and bilateral facet and ligamentum flavum hypertrophy. Mild bilateral foraminal narrowing. No significant canal stenosis.  IMPRESSION: Lumbar spondylosis greatest at the L4-5 level with there is multifactorial and mild right and moderate to severe left foraminal narrowing and mild canal stenosis. Minimal degenerative changes at other levels.   Electronically Signed   By: Kristine Garbe M.D.   On: 09/03/2016 21:14    Lumbar  DG (Complete) 4+V:  Results for orders placed during the hospital encounter of 08/20/16  DG Lumbar Spine Complete   Narrative CLINICAL DATA:  Low back and left leg pain for 3-4 months.  EXAM: LUMBAR SPINE - COMPLETE 4+ VIEW  COMPARISON:  04/03/2015  FINDINGS: Normal alignment of the lumbar vertebral bodies. Disc spaces and vertebral bodies are maintained. Small marginal osteophytes are again noted. Mild to moderate lower lumbar facet disease without definite pars defects. Stable compression deformities in the lower thoracic spine. The visualized bony pelvis is intact and the SI joints appear normal.  IMPRESSION: Normal alignment and no acute bony findings.  Mild degenerative changes.  Stable compression deformities in the lower thoracic spine.   Electronically Signed   By: Marijo Sanes M.D.   On: 08/20/2016 17:27    Knee Imaging: Knee-L MR w contrast:  Results for orders placed during the hospital encounter of 02/25/17  MR Knee Left  Wo Contrast   Narrative CLINICAL DATA:  Knee pain and swelling. Recent fall at home. Prior meniscal surgery in 2015.  EXAM: MRI OF THE LEFT  KNEE WITHOUT CONTRAST  TECHNIQUE: Multiplanar, multisequence MR imaging of the knee was performed. No intravenous contrast was administered.  COMPARISON:  None.  FINDINGS: MENISCI  Medial meniscus: Grade 3 horizontal signal extends to the inferior surface in the posterior horn and to the free edge in the midbody.  Lateral meniscus:  Unremarkable  LIGAMENTS  Cruciates: Accentuated signal and mild expansion of the ACL. Considerable mid PCL abnormal signal compatible with tear or high-grade sprain.  Collaterals:  Unremarkable  CARTILAGE  Patellofemoral: Focal chondral defect along the posterior patellar ridge with underlying subcortical marrow edema, images 7-8 series 3. Mild degenerative chondral thinning in the femoral trochlear groove.  Medial: Moderate degenerative chondral thinning. Focal chondral defect centrally along the medial femoral condyle on image 8/6.  Lateral:  Unremarkable  Joint: Small to moderate knee effusion. Mildly thickened medial plica. Low signal medially in Hoffa' s fat pad likely represents a small amount of fibrosis.  Popliteal Fossa:  Very small Baker's cyst.  Extensor Mechanism:  Prepatellar subcutaneous edema.  Bones: No significant extra-articular osseous abnormalities identified.  Other: No supplemental non-categorized findings.  IMPRESSION: 1. Grade 3 signal in the posterior horn and midbody medial meniscus, suspicious for tear although occasionally postoperative signal can appear in a similar fashion. 2. Highly indistinct midportion of the PCL suspicious for partial tearing or severe degeneration. The ACL is more moderately degenerated. 3. Focal chondral defects along the posterior patellar ridge and centrally along the medial femoral condyles. Moderate chondral thinning in the medial compartment with mild chondral thinning in the patellofemoral joint. 4. Small to moderate knee effusion with mildly thickened medial plica. 5.  Indistinct low signal medially in Hoffa's fat pad likely represents a small amount of fibrosis. 6. Very small Baker's cyst. 7. Prepatellar subcutaneous edema.   Electronically Signed   By: Van Clines M.D.   On: 02/25/2017 16:12    Knee-R MR wo contrast:  Results for orders placed during the hospital encounter of 11/13/15  MR Knee Right Wo Contrast   Narrative CLINICAL DATA:  Right knee pain.  Heard a pop 3 weeks ago.  EXAM: MRI OF THE RIGHT KNEE WITHOUT CONTRAST  TECHNIQUE: Multiplanar, multisequence MR imaging of the knee was performed. No intravenous contrast was administered.  COMPARISON:  None.  FINDINGS: MENISCI  Medial meniscus: Bucket-handle tear of the medial meniscus flipped towards the intercondylar notch.  Lateral meniscus:  Intact.  LIGAMENTS  Cruciates:  Intact ACL and PCL.  Collaterals: Medial collateral ligament is intact. Lateral collateral ligament complex is intact.  CARTILAGE  Patellofemoral: Chondromalacia with partial thickness cartilage loss of the medial patellar facet.  Medial: Partial thickness cartilage loss the medial femoral condyle and medial tibial plateau.  Lateral:  No focal chondral defect.  Joint: Moderate joint effusion. No plical thickening. Minimal edema in Hoffa's fat.  Popliteal Fossa:  No Baker cyst.  Intact popliteus tendon.  Extensor Mechanism:  Intact.  Bones: No focal marrow signal abnormality. No acute fracture or dislocation.  IMPRESSION: 1. Bucket-handle tear of the medial meniscus flipped towards the intercondylar notch. 2. 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 3. Partial thickness cartilage loss the medial femoral condyle and medial tibial plateau. 4. Moderate joint effusion.   Electronically Signed   By: Kathreen Devoid   On: 11/14/2015 08:26    Knee-R DG 4 views:  Results for orders placed during the hospital encounter of 10/14/15  DG Knee  Complete 4 Views Right   Narrative CLINICAL DATA:  Right knee injury, pain/swelling  EXAM: RIGHT KNEE - COMPLETE 4+ VIEW  COMPARISON:  None.  FINDINGS: No fracture or dislocation is seen.  The joint spaces are preserved.  Moderate suprapatellar knee joint effusion.  IMPRESSION: No fracture or dislocation is seen.  Moderate suprapatellar knee joint effusion.   Electronically Signed   By: Julian Hy M.D.   On: 10/14/2015 11:48    Knee-L DG 4 views:  Results for orders placed during the hospital encounter of 08/18/13  DG Knee Complete 4 Views Left   Narrative CLINICAL DATA:  Knee gave out, fell, pain and bruising.  EXAM: LEFT KNEE - COMPLETE 4+ VIEW  COMPARISON:  None.  FINDINGS: No effusion. Small traction spur from the patella at the insertion of the quadriceps tendon. Negative for fracture, dislocation, or other acute bone abnormality. Normal mineralization and alignment.  IMPRESSION: Negative.   Electronically Signed   By: Arne Cleveland M.D.   On: 08/18/2013 17:32    Complexity Note: Imaging results reviewed. Results shared with Mr. Ruderman, using Layman's terms.                         Meds   Current Outpatient Medications:  .  albuterol (PROVENTIL) (2.5 MG/3ML) 0.083% nebulizer solution, USE 1 VIAL IN NEBULIZER FOUR TIMES DAILY AS NEEDED FOR SHORTNESS OF BREATH, Disp: 360 mL, Rfl: 0 .  AMITIZA 24 MCG capsule, TAKE ONE TABLET BY MOUTH TWICE A DAY, Disp: 180 capsule, Rfl: 1 .  atorvastatin (LIPITOR) 20 MG tablet, Take 1 tablet (20 mg total) by mouth every evening., Disp: 90 tablet, Rfl: 3 .  buPROPion (WELLBUTRIN SR) 150 MG 12 hr tablet, Take 1 tablet (150 mg total) by mouth 2 (two) times daily., Disp: 180 tablet, Rfl: 1 .  celecoxib (CELEBREX) 100 MG capsule, Take 100 mg by mouth 2 (two) times daily., Disp: , Rfl:  .  ciclopirox (LOPROX) 0.77 % cream, Apply topically 2 (two) times daily., Disp: 30 g, Rfl: 0 .  CINNAMON PO, Take 2,800 mg by mouth  daily. EACH TABLET IS 1400 MG.  TAKES 2 CAPSULES DAILY, Disp: , Rfl:  .  citalopram (CELEXA) 40 MG tablet, TAKE ONE (1) TABLET BY MOUTH EVERY DAY, Disp: 30 tablet, Rfl: 11 .  Continuous Blood Gluc Receiver (FREESTYLE LIBRE 14 DAY READER) DEVI, 1 Device by Does not apply route  3 (three) times daily., Disp: 1 Device, Rfl: 0 .  Continuous Blood Gluc Sensor (FREESTYLE LIBRE 14 DAY SENSOR) MISC, 1 Device by Does not apply route 3 (three) times daily., Disp: 1 each, Rfl: 0 .  diphenhydrAMINE (BENADRYL) 25 MG tablet, Take 25 mg by mouth 2 (two) times daily., Disp: , Rfl:  .  empagliflozin (JARDIANCE) 10 MG TABS tablet, Take 10 mg by mouth every morning., Disp: 90 tablet, Rfl: 2 .  fluticasone furoate-vilanterol (BREO ELLIPTA) 200-25 MCG/INH AEPB, Inhale 1 puff into the lungs daily., Disp: 30 each, Rfl: 11 .  glipiZIDE (GLUCOTROL) 5 MG tablet, TAKE TWO TABLETS BY MOUTH TWICE DAILY, Disp: 360 tablet, Rfl: 1 .  ipratropium (ATROVENT) 0.02 % nebulizer solution, USE 1 VIAL IN NEBULIZER FOUR TIMES DAILY AS NEEDED, Disp: 300 mL, Rfl: 0 .  Ipratropium-Albuterol (COMBIVENT) 20-100 MCG/ACT AERS respimat, Inhale 1 puff into the lungs every 6 (six) hours as needed for wheezing., Disp: 1 Inhaler, Rfl: 2 .  lansoprazole (PREVACID) 30 MG capsule, TAKE ONE CAPSULE BY MOUTH TWICE A DAY BEFORE A MEAL, Disp: 180 capsule, Rfl: 1 .  metFORMIN (GLUCOPHAGE-XR) 500 MG 24 hr tablet, TAKE 1 TABLET DAILY WITH BREAKFAST FOR 2WEEKS, THEN ADVANCE TO 2 TABLETS WITH BREAKFAST THEREAFTER, Disp: 180 tablet, Rfl: 1 .  Multiple Vitamin (MULTIVITAMIN WITH MINERALS) TABS, Take 1 tablet by mouth every morning. Men's once daily multivitamin packet (vitamin e, calcium, ginseng), Disp: , Rfl:  .  Saw Palmetto, Serenoa repens, (SAW PALMETTO PO), Take 1 capsule by mouth daily., Disp: , Rfl:  .  tamsulosin (FLOMAX) 0.4 MG CAPS capsule, TAKE ONE (1) CAPSULE EACH DAY, Disp: 90 capsule, Rfl: 1 .  tapentadol (NUCYNTA) 50 MG tablet, Take 50 mg by mouth 3  (three) times daily., Disp: , Rfl:  .  testosterone (ANDROGEL) 50 MG/5GM (1%) GEL, Place 5 g onto the skin daily., Disp: , Rfl:  .  traZODone (DESYREL) 150 MG tablet, Take 1 tablet (150 mg total) by mouth at bedtime., Disp: 90 tablet, Rfl: 1 .  triamcinolone cream (KENALOG) 0.1 %, Apply 1 application topically 2 (two) times daily., Disp: 45 g, Rfl: 0 .  tapentadol (NUCYNTA) 50 MG tablet, Take 1 tablet (50 mg total) by mouth every 8 (eight) hours as needed for severe pain. Do not take if you have epilepsy or a history of seizures. Swallow tablets whole. Do not chew, crush or dissolve., Disp: 90 tablet, Rfl: 0  ROS  Constitutional: Denies any fever or chills Gastrointestinal: No reported hemesis, hematochezia, vomiting, or acute GI distress Musculoskeletal: Denies any acute onset joint swelling, redness, loss of ROM, or weakness Neurological: No reported episodes of acute onset apraxia, aphasia, dysarthria, agnosia, amnesia, paralysis, loss of coordination, or loss of consciousness  Allergies  Mr. Fahrner is allergic to azithromycin; erythromycin; keflex [cephalexin]; metformin; and symbicort [budesonide-formoterol fumarate].  [place]  Drug: Mr. Mazon  reports that he does not use drugs. Alcohol:  reports that he does not drink alcohol. Tobacco:  reports that he has been smoking cigarettes.  He started smoking about 23 years ago. He has a 30.00 pack-year smoking history. He has quit using smokeless tobacco. Medical:  has a past medical history of Anxiety, Arthritis, Asthma, Chronic back pain, Chronic pain of left knee, COPD (chronic obstructive pulmonary disease) (Parkin), Diabetes mellitus, GERD (gastroesophageal reflux disease), Shortness of breath, and Sleep apnea. Surgical: Mr. Lederman  has a past surgical history that includes Carpal tunnel release (Left); Shoulder Arthrocentesis (Right); Vasectomy; Hernia repair (Bilateral); Knee  arthroscopy with medial menisectomy (Left, 04/22/2014); Knee arthroscopy  with medial menisectomy (Right, 12/06/2015); Cataract extraction w/PHACO (Right, 03/11/2016); ORIF wrist fracture (Left, 12/19/2016); and Knee arthroscopy with medial menisectomy (Left, 03/20/2017). Family: family history includes Clotting disorder in his maternal grandmother; Emphysema in his maternal grandfather and maternal grandmother.  Constitutional Exam  General appearance: Well nourished, well developed, and well hydrated. In no apparent acute distress Vitals:   12/10/17 0919  BP: 126/78  Pulse: 76  Resp: 16  Temp: 98.2 F (36.8 C)  TempSrc: Oral  SpO2: 96%  Weight: 244 lb (110.7 kg)  Height: _0  (1.778 m)   BMI Assessment: Estimated body mass index is 35.01 kg/m as calculated from the following:   Height as of this encounter: _1  (1.778 m).   Weight as of this encounter: 244 lb (110.7 kg).  BMI interpretation table: BMI level Category Range association with higher incidence of chronic pain  <18 kg/m2 Underweight   18.5-24.9 kg/m2 Ideal body weight   25-29.9 kg/m2 Overweight Increased incidence by 20%  30-34.9 kg/m2 Obese (Class I) Increased incidence by 68%  35-39.9 kg/m2 Severe obesity (Class II) Increased incidence by 136%  >40 kg/m2 Extreme obesity (Class III) Increased incidence by 254%   BMI Readings from Last 4 Encounters:  12/10/17 35.01 kg/m  11/26/17 33.86 kg/m  11/20/17 34.17 kg/m  11/06/17 35.33 kg/m   Wt Readings from Last 4 Encounters:  12/10/17 244 lb (110.7 kg)  11/26/17 236 lb (107 kg)  11/20/17 238 lb 1.9 oz (108 kg)  11/06/17 246 lb 3.2 oz (111.7 kg)  Psych/Mental status: Alert, oriented x 3 (person, place, & time)       Eyes: PERLA Respiratory: No evidence of acute respiratory distress  Cervical Spine Area Exam  Skin & Axial Inspection: No masses, redness, edema, swelling, or associated skin lesions Alignment: Symmetrical Functional ROM: Unrestricted ROM      Stability: No instability detected Muscle Tone/Strength: Functionally  intact. No obvious neuro-muscular anomalies detected. Sensory (Neurological): Unimpaired Palpation: No palpable anomalies              Upper Extremity (UE) Exam    Side: Right upper extremity  Side: Left upper extremity  Skin & Extremity Inspection: Skin color, temperature, and hair growth are WNL. No peripheral edema or cyanosis. No masses, redness, swelling, asymmetry, or associated skin lesions. No contractures.  Skin & Extremity Inspection: Skin color, temperature, and hair growth are WNL. No peripheral edema or cyanosis. No masses, redness, swelling, asymmetry, or associated skin lesions. No contractures.  Functional ROM: Unrestricted ROM          Functional ROM: Unrestricted ROM          Muscle Tone/Strength: Functionally intact. No obvious neuro-muscular anomalies detected.  Muscle Tone/Strength: Functionally intact. No obvious neuro-muscular anomalies detected.  Sensory (Neurological): Unimpaired          Sensory (Neurological): Unimpaired          Palpation: No palpable anomalies              Palpation: No palpable anomalies              Specialized Test(s): Deferred         Specialized Test(s): Deferred          Thoracic Spine Area Exam  Skin & Axial Inspection: No masses, redness, or swelling Alignment: Symmetrical Functional ROM: Unrestricted ROM Stability: No instability detected Muscle Tone/Strength: Functionally intact. No obvious neuro-muscular anomalies detected. Sensory (Neurological): Unimpaired Muscle  strength & Tone: No palpable anomalies  Lumbar Spine Area Exam  Skin & Axial Inspection: No masses, redness, or swelling Alignment: Symmetrical Functional ROM: Unrestricted ROM      Stability: No instability detected Muscle Tone/Strength: Functionally intact. No obvious neuro-muscular anomalies detected. Sensory (Neurological): Unimpaired Palpation: No palpable anomalies       Provocative Tests: Lumbar Hyperextension and rotation test: evaluation deferred today        Lumbar Lateral bending test: evaluation deferred today       Patrick's Maneuver: evaluation deferred today                    Gait & Posture Assessment  Ambulation: Unassisted Gait: Antalgic Posture: Antalgic   Lower Extremity Exam    Side: Right lower extremity  Side: Left lower extremity  Skin & Extremity Inspection: Skin color, temperature, and hair growth are WNL. No peripheral edema or cyanosis. No masses, redness, swelling, asymmetry, or associated skin lesions. No contractures.  Skin & Extremity Inspection: Skin color, temperature, and hair growth are WNL. No peripheral edema or cyanosis. No masses, redness, swelling, asymmetry, or associated skin lesions. No contractures.  Functional ROM: Unrestricted ROM          Functional ROM: Decreased ROM for knee joint  Muscle Tone/Strength: Functionally intact. No obvious neuro-muscular anomalies detected.  Muscle Tone/Strength: Able to Toe-walk & Heel-walk without problems  Sensory (Neurological): Unimpaired  Sensory (Neurological): Articular pain pattern  Palpation: No palpable anomalies  Palpation: No palpable anomalies   Assessment & Plan  Primary Diagnosis & Pertinent Problem List: The primary encounter diagnosis was Chronic knee pain (Primary Area of Pain) (Bilateral) (L>R). Diagnoses of Chronic wrist pain (Secondary Area of Pain) (Left), Chronic pain syndrome, Pharmacologic therapy, Problems influencing health status, Long term prescription opiate use, Opiate use, and Osteoarthritis of the knee (Left) were also pertinent to this visit.  Visit Diagnosis: 1. Chronic knee pain (Primary Area of Pain) (Bilateral) (L>R)   2. Chronic wrist pain (Secondary Area of Pain) (Left)   3. Chronic pain syndrome   4. Pharmacologic therapy   5. Problems influencing health status   6. Long term prescription opiate use   7. Opiate use   8. Osteoarthritis of the knee (Left)    Problems updated and reviewed during this visit: No problems  updated.  Time Note: Greater than 50% of the 40 minute(s) of face-to-face time spent with Mr. Daher, was spent in counseling/coordination of care regarding: the appropriate use of the pain scale, opioid tolerance, Mr. Genet primary cause of pain, the results of his recent test(s), the significance of each one oth the test(s) anomalies and it's corresponding characteristic pain pattern(s), the treatment plan, treatment alternatives, the risks and possible complications of proposed treatment, medication side effects, the opioid analgesic risks and possible complications, realistic expectations, the goals of pain management (increased in functionality), the medication agreement, the importance of providing [place] with accurate post-procedure information, the patient's responsibilities when it comes to controlled substances and the need to collect and read the AVS material.  Plan of Care  Pharmacotherapy (Medications Ordered): Meds ordered this encounter  Medications  . tapentadol (NUCYNTA) 50 MG tablet    Sig: Take 1 tablet (50 mg total) by mouth every 8 (eight) hours as needed for severe pain. Do not take if you have epilepsy or a history of seizures. Swallow tablets whole. Do not chew, crush or dissolve.    Dispense:  90 tablet    Refill:  0  Do not place this medication, or any other prescription from our practice, on "Automatic Refill". Patient may have prescription filled one day early if pharmacy is closed on scheduled refill date. Do not fill until: 12/10/17 To last until: 01/09/18    Procedure Orders     GENICULAR NERVE BLOCK Lab Orders  No laboratory test(s) ordered today   Imaging Orders  No imaging studies ordered today   Referral Orders  No referral(s) requested today    Pharmacological management options:  Opioid Analgesics: We'll take over management today. See above orders Membrane stabilizer: We have discussed the possibility of optimizing this mode of therapy, if  tolerated Muscle relaxant: We have discussed the possibility of a trial NSAID: We have discussed the possibility of a trial Other analgesic(s): To be determined at a later time   Interventional management options: Planned, scheduled, and/or pending:    Diagnostic left genicular nerve block under fluoroscopic guidance and IV sedation   Considering:   Diagnostic left genicular nerve block  Possible Left genicular RFA    PRN Procedures:   None at this time   Provider-requested follow-up: Return for Procedure (w/ sedation): (L) Genicular NB.  Future Appointments  Date Time Provider [place]  12/16/2017 10:00 AM [facility] 3 [facility] None  05/20/2018 10:30 AM Eustace Pen, LPN [facility]  [id number]  9:00 AM Pleas Koch, NP [facility]    Primary Care Physician: Pleas Koch, NP Location: [facility] Note by: Gaspar Cola, MD Date: 12/10/2017; Time: 11:59 AM   .pms

## 2017-12-10 ENCOUNTER — Encounter: Payer: Self-pay | Admitting: Pain Medicine

## 2017-12-10 ENCOUNTER — Ambulatory Visit: Payer: Medicare Other | Attending: Pain Medicine | Admitting: Pain Medicine

## 2017-12-10 VITALS — BP 126/78 | HR 76 | Temp 98.2°F | Resp 16 | Ht 70.0 in | Wt 244.0 lb

## 2017-12-10 DIAGNOSIS — M25561 Pain in right knee: Secondary | ICD-10-CM | POA: Diagnosis not present

## 2017-12-10 DIAGNOSIS — J449 Chronic obstructive pulmonary disease, unspecified: Secondary | ICD-10-CM | POA: Diagnosis not present

## 2017-12-10 DIAGNOSIS — Z79891 Long term (current) use of opiate analgesic: Secondary | ICD-10-CM | POA: Insufficient documentation

## 2017-12-10 DIAGNOSIS — G4700 Insomnia, unspecified: Secondary | ICD-10-CM | POA: Diagnosis not present

## 2017-12-10 DIAGNOSIS — G894 Chronic pain syndrome: Secondary | ICD-10-CM | POA: Diagnosis not present

## 2017-12-10 DIAGNOSIS — Z79899 Other long term (current) drug therapy: Secondary | ICD-10-CM | POA: Diagnosis not present

## 2017-12-10 DIAGNOSIS — F329 Major depressive disorder, single episode, unspecified: Secondary | ICD-10-CM | POA: Insufficient documentation

## 2017-12-10 DIAGNOSIS — F17210 Nicotine dependence, cigarettes, uncomplicated: Secondary | ICD-10-CM | POA: Diagnosis not present

## 2017-12-10 DIAGNOSIS — G4733 Obstructive sleep apnea (adult) (pediatric): Secondary | ICD-10-CM | POA: Insufficient documentation

## 2017-12-10 DIAGNOSIS — M1712 Unilateral primary osteoarthritis, left knee: Secondary | ICD-10-CM | POA: Insufficient documentation

## 2017-12-10 DIAGNOSIS — F419 Anxiety disorder, unspecified: Secondary | ICD-10-CM | POA: Insufficient documentation

## 2017-12-10 DIAGNOSIS — G8929 Other chronic pain: Secondary | ICD-10-CM

## 2017-12-10 DIAGNOSIS — M25562 Pain in left knee: Secondary | ICD-10-CM | POA: Diagnosis not present

## 2017-12-10 DIAGNOSIS — K219 Gastro-esophageal reflux disease without esophagitis: Secondary | ICD-10-CM | POA: Insufficient documentation

## 2017-12-10 DIAGNOSIS — K5909 Other constipation: Secondary | ICD-10-CM | POA: Insufficient documentation

## 2017-12-10 DIAGNOSIS — E785 Hyperlipidemia, unspecified: Secondary | ICD-10-CM | POA: Diagnosis not present

## 2017-12-10 DIAGNOSIS — Z7984 Long term (current) use of oral hypoglycemic drugs: Secondary | ICD-10-CM | POA: Insufficient documentation

## 2017-12-10 DIAGNOSIS — Z5181 Encounter for therapeutic drug level monitoring: Secondary | ICD-10-CM | POA: Insufficient documentation

## 2017-12-10 DIAGNOSIS — Z789 Other specified health status: Secondary | ICD-10-CM

## 2017-12-10 DIAGNOSIS — R1032 Left lower quadrant pain: Secondary | ICD-10-CM | POA: Diagnosis not present

## 2017-12-10 DIAGNOSIS — E119 Type 2 diabetes mellitus without complications: Secondary | ICD-10-CM | POA: Insufficient documentation

## 2017-12-10 DIAGNOSIS — M25532 Pain in left wrist: Secondary | ICD-10-CM | POA: Diagnosis not present

## 2017-12-10 DIAGNOSIS — F1190 Opioid use, unspecified, uncomplicated: Secondary | ICD-10-CM

## 2017-12-10 DIAGNOSIS — Z6835 Body mass index (BMI) 35.0-35.9, adult: Secondary | ICD-10-CM | POA: Diagnosis not present

## 2017-12-10 MED ORDER — TAPENTADOL HCL 50 MG PO TABS: 50.0000 mg | ORAL_TABLET | Freq: Three times a day (TID) | ORAL | 0 refills | Status: DC | PRN

## 2017-12-10 NOTE — Patient Instructions (Addendum)
____________________________________________________________________________________________  Pain Scale  Introduction: The pain score used by this practice is the Verbal Numerical Rating Scale (VNRS-11). This is an 11-point scale. It is for adults and children 10 years or older. There are significant differences in how the pain score is reported, used, and applied. Forget everything you learned in the past and learn this scoring system.  General Information: The scale should reflect your current level of pain. Unless you are specifically asked for the level of your worst pain, or your average pain. If you are asked for one of these two, then it should be understood that it is over the past 24 hours.  Basic Activities of Daily Living (ADL): Personal hygiene, dressing, eating, transferring, and using restroom.  Instructions: Most patients tend to report their level of pain as a combination of two factors, their physical pain and their psychosocial pain. This last one is also known as "suffering" and it is reflection of how physical pain affects you socially and psychologically. From now on, report them separately. From this point on, when asked to report your pain level, report only your physical pain. Use the following table for reference.  Pain Clinic Pain Levels (0-5/10)  Pain Level Score  Description  No Pain 0   Mild pain 1 Nagging, annoying, but does not interfere with basic activities of daily living (ADL). Patients are able to eat, bathe, get dressed, toileting (being able to get on and off the toilet and perform personal hygiene functions), transfer (move in and out of bed or a chair without assistance), and maintain continence (able to control bladder and bowel functions). Blood pressure and heart rate are unaffected. A normal heart rate for a healthy adult ranges from 60 to 100 bpm (beats per minute).   Mild to moderate pain 2 Noticeable and distracting. Impossible to hide from other  people. More frequent flare-ups. Still possible to adapt and function close to normal. It can be very annoying and may have occasional stronger flare-ups. With discipline, patients may get used to it and adapt.   Moderate pain 3 Interferes significantly with activities of daily living (ADL). It becomes difficult to feed, bathe, get dressed, get on and off the toilet or to perform personal hygiene functions. Difficult to get in and out of bed or a chair without assistance. Very distracting. With effort, it can be ignored when deeply involved in activities.   Moderately severe pain 4 Impossible to ignore for more than a few minutes. With effort, patients may still be able to manage work or participate in some social activities. Very difficult to concentrate. Signs of autonomic nervous system discharge are evident: dilated pupils (mydriasis); mild sweating (diaphoresis); sleep interference. Heart rate becomes elevated (>115 bpm). Diastolic blood pressure (lower number) rises above 100 mmHg. Patients find relief in laying down and not moving.   Severe pain 5 Intense and extremely unpleasant. Associated with frowning face and frequent crying. Pain overwhelms the senses.  Ability to do any activity or maintain social relationships becomes significantly limited. Conversation becomes difficult. Pacing back and forth is common, as getting into a comfortable position is nearly impossible. Pain wakes you up from deep sleep. Physical signs will be obvious: pupillary dilation; increased sweating; goosebumps; brisk reflexes; cold, clammy hands and feet; nausea, vomiting or dry heaves; loss of appetite; significant sleep disturbance with inability to fall asleep or to remain asleep. When persistent, significant weight loss is observed due to the complete loss of appetite and sleep deprivation.  Blood   pressure and heart rate becomes significantly elevated. Caution: If elevated blood pressure triggers a pounding headache  associated with blurred vision, then the patient should immediately seek attention at an urgent or emergency care unit, as these may be signs of an impending stroke.    Emergency Department Pain Levels (6-10/10)  Emergency Room Pain 6 Severely limiting. Requires emergency care and should not be seen or managed at an outpatient pain management facility. Communication becomes difficult and requires great effort. Assistance to reach the emergency department may be required. Facial flushing and profuse sweating along with potentially dangerous increases in heart rate and blood pressure will be evident.   Distressing pain 7 Self-care is very difficult. Assistance is required to transport, or use restroom. Assistance to reach the emergency department will be required. Tasks requiring coordination, such as bathing and getting dressed become very difficult.   Disabling pain 8 Self-care is no longer possible. At this level, pain is disabling. The individual is unable to do even the most "basic" activities such as walking, eating, bathing, dressing, transferring to a bed, or toileting. Fine motor skills are lost. It is difficult to think clearly.   Incapacitating pain 9 Pain becomes incapacitating. Thought processing is no longer possible. Difficult to remember your own name. Control of movement and coordination are lost.   The worst pain imaginable 10 At this level, most patients pass out from pain. When this level is reached, collapse of the autonomic nervous system occurs, leading to a sudden drop in blood pressure and heart rate. This in turn results in a temporary and dramatic drop in blood flow to the brain, leading to a loss of consciousness. Fainting is one of the body's self defense mechanisms. Passing out puts the brain in a calmed state and causes it to shut down for a while, in order to begin the healing process.    Summary: 1. Refer to this scale when providing [place] with your pain level. 2. Be  accurate and careful when reporting your pain level. This will help with your care. 3. Over-reporting your pain level will lead to loss of credibility. 4. Even a level of 1/10 means that there is pain and will be treated at our facility. 5. High, inaccurate reporting will be documented as "Symptom Exaggeration", leading to loss of credibility and suspicions of possible secondary gains such as obtaining more narcotics, or wanting to appear disabled, for fraudulent reasons. 6. Only pain levels of 5 or below will be seen at our facility. 7. Pain levels of 6 and above will be sent to the Emergency Department and the appointment cancelled. ____________________________________________________________________________________________    ____________________________________________________________________________________________  Medication Rules  Applies to: All patients receiving prescriptions (written or electronic).  Pharmacy of record: Pharmacy where electronic prescriptions will be sent. If written prescriptions are taken to a different pharmacy, please inform the nursing staff. The pharmacy listed in the electronic medical record should be the one where you would like electronic prescriptions to be sent.  Prescription refills: Only during scheduled appointments. Applies to both, written and electronic prescriptions.  NOTE: The following applies primarily to controlled substances (Opioid* Pain Medications).   Patient's responsibilities: 1. Pain Pills: Bring all pain pills to every appointment (except for procedure appointments). 2. Pill Bottles: Bring pills in original pharmacy bottle. Always bring newest bottle. Bring bottle, even if empty. 3. Medication refills: You are responsible for knowing and keeping track of what medications you need refilled. The day before your appointment, write a list of  all prescriptions that need to be refilled. Bring that list to your appointment and give it to  the admitting nurse. Prescriptions will be written only during appointments. If you forget a medication, it will not be "Called in", "Faxed", or "electronically sent". You will need to get another appointment to get these prescribed. 4. Prescription Accuracy: You are responsible for carefully inspecting your prescriptions before leaving our office. Have the discharge nurse carefully go over each prescription with you, before taking them home. Make sure that your name is accurately spelled, that your address is correct. Check the name and dose of your medication to make sure it is accurate. Check the number of pills, and the written instructions to make sure they are clear and accurate. Make sure that you are given enough medication to last until your next medication refill appointment. 5. Taking Medication: Take medication as prescribed. Never take more pills than instructed. Never take medication more frequently than prescribed. Taking less pills or less frequently is permitted and encouraged, when it comes to controlled substances (written prescriptions).  6. Inform other Doctors: Always inform, all of your healthcare providers, of all the medications you take. 7. Pain Medication from other Providers: You are not allowed to accept any additional pain medication from any other Doctor or Healthcare provider. There are two exceptions to this rule. (see below) In the event that you require additional pain medication, you are responsible for notifying us, as stated below. 8. Medication Agreement: You are responsible for carefully reading and following our Medication Agreement. This must be signed before receiving any prescriptions from our practice. Safely store a copy of your signed Agreement. Violations to the Agreement will result in no further prescriptions. (Additional copies of our Medication Agreement are available upon request.) 9. Laws, Rules, & Regulations: All patients are expected to follow all  Federal and [employer], [employer], Rules, [employer]. Ignorance of the Laws does not constitute a valid excuse. The use of any illegal substances is prohibited. 10. Adopted CDC guidelines & recommendations: Target dosing levels will be at or below 60 MME/day. Use of benzodiazepines** is not recommended.  Exceptions: There are only two exceptions to the rule of not receiving pain medications from other Healthcare Providers. 1. Exception #1 (Emergencies): In the event of an emergency (i.e.: accident requiring emergency care), you are allowed to receive additional pain medication. However, you are responsible for: As soon as you are able, call our office [phone], at any time of the day or night, and leave a message stating your name, the date and nature of the emergency, and the name and dose of the medication prescribed. In the event that your call is answered by a member of our staff, make sure to document and save the date, time, and the name of the person that took your information.  2. Exception #2 (Planned Surgery): In the event that you are scheduled by another doctor or dentist to have any type of surgery or procedure, you are allowed (for a period no longer than 30 days), to receive additional pain medication, for the acute post-op pain. However, in this case, you are responsible for picking up a copy of our "Post-op Pain Management for Surgeons" handout, and giving it to your surgeon or dentist. This document is available at our office, and does not require an appointment to obtain it. Simply go to our office during business hours (Monday-Thursday from 8:00 AM to 4:00 PM) (Friday 8:00 AM to 12:00 Noon) or if  you have a scheduled appointment with us, prior to your surgery, and ask for it by name. In addition, you will need to provide us with your name, name of your surgeon, type of surgery, and date of procedure or surgery.  *Opioid medications include: morphine, codeine, oxycodone,  oxymorphone, hydrocodone, hydromorphone, meperidine, tramadol, tapentadol, buprenorphine, fentanyl, methadone. **Benzodiazepine medications include: diazepam (Valium), alprazolam (Xanax), clonazepam (Klonopine), lorazepam (Ativan), clorazepate (Tranxene), chlordiazepoxide (Librium), estazolam (Prosom), oxazepam (Serax), temazepam (Restoril), triazolam (Halcion)  ____________________________________________________________________________________________ ____________________________________________________________________________________________  Preparing for Procedure with Sedation Instructions: . Oral Intake: Do not eat or drink anything for at least 8 hours prior to your procedure. . Transportation: Public transportation is not allowed. Bring an adult driver. The driver must be physically present in our waiting room before any procedure can be started. Marland Kitchen. Physical Assistance: Bring an adult physically capable of assisting you, in the event you need help. This adult should keep you company at home for at least 6 hours after the procedure. . Blood Pressure Medicine: Take your blood pressure medicine with a sip of water the morning of the procedure. . Blood thinners:  . Diabetics on insulin: Notify the staff so that you can be scheduled 1st case in the morning. If your diabetes requires high dose insulin, take only  of your normal insulin dose the morning of the procedure and notify the staff that you have done so. . Preventing infections: Shower with an antibacterial soap the morning of your procedure. . Build-up your immune system: Take 1000 mg of Vitamin C with every meal (3 times a day) the day prior to your procedure. Marland Kitchen. Antibiotics: Inform the staff if you have a condition or reason that requires you to take antibiotics before dental procedures. . Pregnancy: If you are pregnant, call and cancel the procedure. . Sickness: If you have a cold, fever, or any active infections, call and cancel the  procedure. . Arrival: You must be in the facility at least 30 minutes prior to your scheduled procedure. . Children: Do not bring children with you. . Dress appropriately: Bring dark clothing that you would not mind if they get stained. . Valuables: Do not bring any jewelry or valuables. Procedure appointments are reserved for interventional treatments only. Marland Kitchen. No Prescription Refills. . No medication changes will be discussed during procedure appointments. . No disability issues will be discussed. ____________________________________________________________________________________________   ____________________________________________________________________________________________  Genicular Nerve Block  What is a genicular nerve block? A genicular nerve block is the injection of a local anesthetic to block the nerves that transmits pain from the knee.  What is the purpose of a facet nerve block? A genicular nerve block is a diagnostic procedure to determine if the pathologic changes (i.e. arthritis, meniscal tears, etc) and inflammation within the knee joint is the source of your knee pain. It also confirms that the knee pain will respond well to the actual treatment procedure. If a genicular nerve block works, it will give you relief for several hours. After that, the pain is expected to return to normal. This test is always performed twice (usually a week or two apart) because two successful tests are required to move onto treatment. If both diagnostic tests are positive, then we schedule a treatment called radiofrequency (RF) ablation. In this procedure, the same nerves are cauterized, which typically leads to pain relief for 4 -18 months. If this process works well for one knee, it can be performed on the other knee if needed.  How is the procedure performed?  You will be placed on the procedure table. The injection site is sterilized with either iodine or chlorhexadine. The site to be  injected is numbed with a local anesthetic, and a needle is directed to the target area. X-ray guidance is used to ensure proper placement and positioning of the needle. When the needle is properly positioned near the genicular nerve, local anesthetic is injected to numb that nerve. This will be repeated at multiple sites around the knee to block all genicular nerves.  Will the procedure be painful? The injection can be painful and we therefore provide the option of receiving IV sedation. IV sedation, combined with local anesthetic, can make the injection nearly pain free. It allows you to remain very still during the procedure, which can also make the injection easier, faster, and more successful. If you decide to have IV sedation, you must have a driver to get you home safely afterwards. In addition, you cannot have anything to eat or drink within 8 hours of your appointment (clear liquids are allowed until 3 hours before the procedure). If you take medications for diabetes, these medications may need to be adjusted the morning of the procedure. Your primary care physician can help you with this adjustment.  What are the discharge instructions? If you received IV sedation do not drive or operate machinery for at least 24 hours after the procedure. You may return to work the next day following your procedure. You may resume your normal diet immediately. Do not engage in any strenuous activity for 24 hours. You should, however, engage in moderate activity that typically causes your ususal pain. If the block works, those activities should not be painful for several hours after the injection. Do not take a bath, swim, or use a hot tub for 24 hours (you may take a shower). Call the office if you have any of the following: severe pain afterwards (different than your usual symptoms), redness/swelling/discharge at the injection site(s), fevers/chills, difficulty with bowel or bladder functions.  What are the risks  and side effects? The complication rate for this procedure is very low. Whenever a needle enters the skin, bleeding or infection can occur. Some other serious but extremely rare risks include paralysis and death. You may have an allergic reaction to any of the medications used. If you have a known allergy to any medications, especially local anesthetics, notify our staff before the procedure takes place. You may experience any of the following side effects up to 4 - 6 hours after the procedure: . Leg muscle weakness or numbness may occur due to the local anesthetic affecting the nerves that control your legs (this is a temporary affect and it is not paralysis). If you have any leg weakness or numbness, walk only with assistance in order to prevent falls and injury. Your leg strength will return slowly and completely. . Dizziness may occur due to a decrease in your blood pressure. If this occurs, remain in a seated or lying position. Gradually sit up, and then stand after at least 10 minutes of sitting. . Mild headaches may occur. Drink fluids and take pain medications if needed. If the headaches persist or become severe, call the office. . Mild discomfort at the injection site can occur. This typically lasts for a few hours but can persist for a couple days. If this occurs, take anti-inflammatories or pain medications, apply ice to the area the day of the procedure. If it persists, apply moist heat in the day(s) following.  The side effects listed above can be normal. They are not dangerous and will resolve on their own. If, however, you experience any of the following, a complication may have occurred and you should either contact your doctor. If he is not readily available, then you should proceed to the closest urgent care center for evaluation: . Severe or progressive pain at the injection site(s) . Arm or leg weakness that progressively worsens or persists for longer than 8 hours . Severe or  progressive redness, swelling, or discharge from the injections site(s) . Fevers, chills, nausea, or vomiting . Bowel or bladder dysfunction (i.e. inability to urinate or pass stool or difficulty controlling either)  How long does it take for the procedure to work? You should feel relief from your usual pain within the first hour. Again, this is only expected to last for several hours, at the most. Remember, you may be sore in the middle part of your back from the needles, and you must distinguish this from your usual pain. ____________________________________________________________________________________________  ____________________________________________________________________________________________  Preparing for Procedure with Sedation Instructions: . Oral Intake: Do not eat or drink anything for at least 8 hours prior to your procedure. . Transportation: Public transportation is not allowed. Bring an adult driver. The driver must be physically present in our waiting room before any procedure can be started. Marland Kitchen Physical Assistance: Bring an adult physically capable of assisting you, in the event you need help. This adult should keep you company at home for at least 6 hours after the procedure. . Blood Pressure Medicine: Take your blood pressure medicine with a sip of water the morning of the procedure. . Blood thinners:  . Diabetics on insulin: Notify the staff so that you can be scheduled 1st case in the morning. If your diabetes requires high dose insulin, take only  of your normal insulin dose the morning of the procedure and notify the staff that you have done so. . Preventing infections: Shower with an antibacterial soap the morning of your procedure. . Build-up your immune system: Take 1000 mg of Vitamin C with every meal (3 times a day) the day prior to your procedure. Marland Kitchen Antibiotics: Inform the staff if you have a condition or reason that requires you to take antibiotics before  dental procedures. . Pregnancy: If you are pregnant, call and cancel the procedure. . Sickness: If you have a cold, fever, or any active infections, call and cancel the procedure. . Arrival: You must be in the facility at least 30 minutes prior to your scheduled procedure. . Children: Do not bring children with you. . Dress appropriately: Bring dark clothing that you would not mind if they get stained. . Valuables: Do not bring any jewelry or valuables. Procedure appointments are reserved for interventional treatments only. Marland Kitchen No Prescription Refills. . No medication changes will be discussed during procedure appointments. . No disability issues will be discussed. ____________________________________________________________________________________________

## 2017-12-15 ENCOUNTER — Telehealth: Payer: Self-pay | Admitting: Pulmonary Disease

## 2017-12-15 DIAGNOSIS — G4733 Obstructive sleep apnea (adult) (pediatric): Secondary | ICD-10-CM | POA: Diagnosis not present

## 2017-12-15 NOTE — Telephone Encounter (Signed)
Per RA, increased apneas while sleep on his back, 35 events per hour. Overall, mild OSA with 10 events per hour.   Continue on continue CPAP settings.

## 2017-12-16 ENCOUNTER — Other Ambulatory Visit (HOSPITAL_COMMUNITY): Payer: Medicare Other

## 2017-12-18 ENCOUNTER — Other Ambulatory Visit: Payer: Self-pay | Admitting: *Deleted

## 2017-12-18 DIAGNOSIS — G4733 Obstructive sleep apnea (adult) (pediatric): Secondary | ICD-10-CM

## 2017-12-19 NOTE — Telephone Encounter (Signed)
Spoke with patient. He is aware of results. Nothing else needed at time of call.  

## 2017-12-24 NOTE — Progress Notes (Signed)
Patient's Name: Joseph [name]  MRN: [id number]  Referring Provider: Delano Metz, MD  DOB: 04-10-1971  PCP: Doreene Nest, NP  DOS: 12/25/2017  Note by: Oswaldo Done, MD  Service setting: Ambulatory outpatient  Specialty: Interventional Pain Management  Patient type: Established  Location: [facility] (AMB) Pain Management Facility  Visit type: Interventional Procedure   Primary Reason for Visit: Interventional Pain Management Treatment. CC: Knee Pain (left)  Procedure:  Anesthesia, Analgesia, Anxiolysis:  Type: Therapeutic Superior-lateral, Superior-medial, and Inferior-medial, Genicular Nerves Block. (CPT [phone]) Region: Lateral, Anterior, and Medial aspects of the knee joint, above and below the knee joint proper. Level: Superior and inferior to the knee joint. Laterality: Left  Type: Local Anesthesia with Moderate (Conscious) Sedation Local Anesthetic: Lidocaine 1% Route: Intravenous (IV) IV Access: Secured Sedation: Meaningful verbal contact was maintained at all times during the procedure  Indication(s): Analgesia and Anxiety   Indications: 1. Chronic knee pain (Primary Area of Pain) (Bilateral) (L>R)   2. Osteoarthritis of the knee (Left)   3. Chronic pain of both knees   4. Primary osteoarthritis of left knee    Pain Score: Pre-procedure: 8 /10 Post-procedure: 3 /10  Pre-op Assessment:  Joseph [name] is a 47 y.o. (year old), male patient, seen today for interventional treatment. He  has a past surgical history that includes Carpal tunnel release (Left); Shoulder Arthrocentesis (Right); Vasectomy; Hernia repair (Bilateral); Knee arthroscopy with medial menisectomy (Left, 04/22/2014); Knee arthroscopy with medial menisectomy (Right, 12/06/2015); Cataract extraction w/PHACO (Right, 03/11/2016); ORIF wrist fracture (Left, 12/19/2016); and Knee arthroscopy with medial menisectomy (Left, 03/20/2017). Joseph [name] has a current medication list which includes the following prescription(s):  albuterol, amitiza, atorvastatin, bupropion, celecoxib, ciclopirox, cinnamon, citalopram, freestyle libre 14 day reader, freestyle libre 14 day sensor, diphenhydramine, empagliflozin, fluticasone furoate-vilanterol, glipizide, ipratropium, ipratropium-albuterol, lansoprazole, metformin, multivitamin with minerals, saw palmetto (serenoa repens), tamsulosin, tapentadol, tapentadol, testosterone, trazodone, and triamcinolone cream, and the following Facility-Administered Medications: fentanyl, lactated ringers, and midazolam. His primarily concern today is the Knee Pain (left)  Initial Vital Signs: See below BMI: Estimated body mass index is 34.44 kg/m as calculated from the following:   Height as of this encounter: 5\' 10"  (1.778 m).   Weight as of this encounter: 240 lb (108.9 kg).  Risk Assessment: Allergies: Reviewed. He is allergic to azithromycin; erythromycin; keflex [cephalexin]; metformin; and symbicort [budesonide-formoterol fumarate].  Allergy Precautions: None required Coagulopathies: Reviewed. None identified.  Blood-thinner therapy: None at this time Active Infection(s): Reviewed. None identified. Joseph [name] is afebrile  Site Confirmation: Joseph [name] was asked to confirm the procedure and laterality before marking the site Procedure checklist: Completed Consent: Before the procedure and under the influence of no sedative(s), amnesic(s), or anxiolytics, the patient was informed of the treatment options, risks and possible complications. To fulfill our ethical and legal obligations, as recommended by the American Medical Association's Code of Ethics, I have informed the patient of my clinical impression; the nature and purpose of the treatment or procedure; the risks, benefits, and possible complications of the intervention; the alternatives, including doing nothing; the risk(s) and benefit(s) of the alternative treatment(s) or procedure(s); and the risk(s) and benefit(s) of doing  nothing. The patient was provided information about the general risks and possible complications associated with the procedure. These may include, but are not limited to: failure to achieve desired goals, infection, bleeding, organ or nerve damage, allergic reactions, paralysis, and death. In addition, the patient was informed of those risks and complications associated to the procedure, such  as failure to decrease pain; infection; bleeding; organ or nerve damage with subsequent damage to sensory, motor, and/or autonomic systems, resulting in permanent pain, numbness, and/or weakness of one or several areas of the body; allergic reactions; (i.e.: anaphylactic reaction); and/or death. Furthermore, the patient was informed of those risks and complications associated with the medications. These include, but are not limited to: allergic reactions (i.e.: anaphylactic or anaphylactoid reaction(s)); adrenal axis suppression; blood sugar elevation that in diabetics may result in ketoacidosis or comma; water retention that in patients with history of congestive heart failure may result in shortness of breath, pulmonary edema, and decompensation with resultant heart failure; weight gain; swelling or edema; medication-induced neural toxicity; particulate matter embolism and blood vessel occlusion with resultant organ, and/or nervous system infarction; and/or aseptic necrosis of one or more joints. Finally, the patient was informed that Medicine is not an exact science; therefore, there is also the possibility of unforeseen or unpredictable risks and/or possible complications that may result in a catastrophic outcome. The patient indicated having understood very clearly. We have given the patient no guarantees and we have made no promises. Enough time was given to the patient to ask questions, all of which were answered to the patient's satisfaction. Joseph [name] has indicated that he wanted to continue with the  procedure. Attestation: I, the ordering provider, attest that I have discussed with the patient the benefits, risks, side-effects, alternatives, likelihood of achieving goals, and potential problems during recovery for the procedure that I have provided informed consent. Date: 12/25/2017; Time: 4:51 PM  Pre-Procedure Preparation:  Monitoring: As per clinic protocol. Respiration, ETCO2, SpO2, BP, heart rate and rhythm monitor placed and checked for adequate function Safety Precautions: Patient was assessed for positional comfort and pressure points before starting the procedure. Time-out: I initiated and conducted the "Time-out" before starting the procedure, as per protocol. The patient was asked to participate by confirming the accuracy of the "Time Out" information. Verification of the correct person, site, and procedure were performed and confirmed by me, the nursing staff, and the patient. "Time-out" conducted as per Joint Commission's Universal Protocol (UP.01.01.01). "Time-out" Date & Time: 12/25/2017; 0855 hrs.  Description of Procedure Process:  Position: Supine Target Area: For Genicular Nerve block(s), the targets are: the superior-lateral genicular nerve, located in the lateral distal portion of the femoral shaft as it curves to form the lateral epicondyle, in the region of the distal femoral metaphysis; the superior-medial genicular nerve, located in the medial distal portion of the femoral shaft as it curves to form the medial epicondyle; and the inferior-medial genicular nerve, located in the medial, proximal portion of the tibial shaft, as it curves to form the medial epicondyle, in the region of the proximal tibial metaphysis. Approach: Anterior, ipsilateral approach. Area Prepped: Entire knee area, from mid-thigh to mid-shin, lateral, anterior, and medial aspects. Prepping solution: ChloraPrep (2% chlorhexidine gluconate and 70% isopropyl alcohol) Safety Precautions: Aspiration looking  for blood return was conducted prior to all injections. At no point did we inject any substances, as a needle was being advanced. No attempts were made at seeking any paresthesias. Safe injection practices and needle disposal techniques used. Medications properly checked for expiration dates. SDV (single dose vial) medications used. Latex Allergy precautions taken.   Description of the Procedure: Protocol guidelines were followed. The patient was placed in position over the procedure table. The target area was identified and the area prepped in the usual manner. Skin desensitized using vapocoolant spray. Skin & deeper tissues  infiltrated with local anesthetic. Appropriate amount of time allowed to pass for local anesthetics to take effect. The procedure needles were then advanced to the target area. Proper needle placement secured. Negative aspiration confirmed. Solution injected in intermittent fashion, asking for systemic symptoms every 0.5cc of injectate. The needles were then removed and the area cleansed, making sure to leave some of the prepping solution back to take advantage of its long term bactericidal properties.  Vitals:   12/25/17 0907 12/25/17 0917 12/25/17 0927 12/25/17 0937  BP: 119/82 118/72 112/72 117/72  Pulse:      Resp: 15 16 16 16   Temp:  98 F (36.7 C)  97.8 F (36.6 C)  TempSrc:      SpO2: 95% 95% 94% 94%  Weight:      Height:        Start Time: 0855 hrs. End Time: 0906 hrs. Materials:  Needle(s) Type: Regular needle Gauge: 22G Length: 3.5-in Medication(s): We administered midazolam, fentaNYL, lidocaine, ropivacaine (PF) 2 mg/mL (0.2%), and methylPREDNISolone acetate. Please see chart orders for dosing details.  Imaging Guidance (Non-Spinal):  Type of Imaging Technique: Fluoroscopy Guidance (Non-Spinal) Indication(s): Assistance in needle guidance and placement for procedures requiring needle placement in or near specific anatomical locations not easily accessible  without such assistance. Exposure Time: Please see nurses notes. Contrast: Before injecting any contrast, we confirmed that the patient did not have an allergy to iodine, shellfish, or radiological contrast. Once satisfactory needle placement was completed at the desired level, radiological contrast was injected. Contrast injected under live fluoroscopy. No contrast complications. See chart for type and volume of contrast used. Fluoroscopic Guidance: I was personally present during the use of fluoroscopy. "Tunnel Vision Technique" used to obtain the best possible view of the target area. Parallax error corrected before commencing the procedure. "Direction-depth-direction" technique used to introduce the needle under continuous pulsed fluoroscopy. Once target was reached, antero-posterior, oblique, and lateral fluoroscopic projection used confirm needle placement in all planes. Images permanently stored in EMR. Interpretation: I personally interpreted the imaging intraoperatively. Adequate needle placement confirmed in multiple planes. Appropriate spread of contrast into desired area was observed. No evidence of afferent or efferent intravascular uptake. Permanent images saved into the patient's record.  Antibiotic Prophylaxis:  Indication(s): None identified Antibiotic given: None  Post-operative Assessment:  Post-procedure Vital Signs:  Pulse Rate: 78 Temp: 97.8 F (36.6 C) ECG Heart Rate: 79 Resp: 16 BP: 117/72 SpO2: 94 % ETCO2: 40 EBL: None Complications: No immediate post-treatment complications observed by team, or reported by patient. Note: The patient tolerated the entire procedure well. A repeat set of vitals were taken after the procedure and the patient was kept under observation following institutional policy, for this type of procedure. Post-procedural neurological assessment was performed, showing return to baseline, prior to discharge. The patient was provided with post-procedure  discharge instructions, including a section on how to identify potential problems. Should any problems arise concerning this procedure, the patient was given instructions to immediately contact us, at any time, without hesitation. In any case, we plan to contact the patient by telephone for a follow-up status report regarding this interventional procedure. Comments:  No additional relevant information.  Plan of Care    Imaging Orders     DG C-Arm 1-60 Min-No Report  Procedure Orders     GENICULAR NERVE BLOCK  Medications ordered for procedure: Meds ordered this encounter  Medications  . midazolam (VERSED) 5 MG/5ML injection 1-2 mg    Make sure Flumazenil is available  in the pyxis when using this medication. If oversedation occurs, administer 0.2 mg IV over 15 sec. If after 45 sec no response, administer 0.2 mg again over 1 min; may repeat at 1 min intervals; not to exceed 4 doses (1 mg)  . fentaNYL (SUBLIMAZE) injection 25-50 mcg    Make sure Narcan is available in the pyxis when using this medication. In the event of respiratory depression (RR< 8/min): Titrate NARCAN (naloxone) in increments of 0.1 to 0.2 mg IV at 2-3 minute intervals, until desired degree of reversal.  . lactated ringers infusion 1,000 mL  . lidocaine (XYLOCAINE) 2 % (with pres) injection 400 mg  . ropivacaine (PF) 2 mg/mL (0.2%) (NAROPIN) injection 9 mL  . methylPREDNISolone acetate (DEPO-MEDROL) injection 80 mg   Medications administered: We administered midazolam, fentaNYL, lidocaine, ropivacaine (PF) 2 mg/mL (0.2%), and methylPREDNISolone acetate.  See the medical record for exact dosing, route, and time of administration.  New Prescriptions   No medications on file   Disposition: Discharge home  Discharge Date & Time: 12/25/2017; 0939 hrs.   Physician-requested Follow-up: Return for post-procedure eval (2 wks), w/ Dr. Laban EmperorNaveira.  Future Appointments  Date Time Provider Department Center  01/12/2018  9:15 AM  Delano MetzNaveira, Amberlee Garvey, MD [facility] None  05/20/2018 10:30 AM Robert Bellowinson, Tarlesia R, LPN [facility]  05/26/2018  9:00 AM Doreene Nestlark, Katherine K, NP [facility]   Primary Care Physician: Doreene Nestlark, Katherine K, NP [facility] Note by: Oswaldo DoneFrancisco A Kwali Wrinkle, MD Date: 12/25/2017; Time: 11:19 AM  Disclaimer:  Medicine is not an Visual merchandiserexact science. The only guarantee in medicine is that nothing is guaranteed. It is important to note that the decision to proceed with this intervention was based on the information collected from the patient. The Data and conclusions were drawn from the patient's questionnaire, the interview, and the physical examination. Because the information was provided in large part by the patient, it cannot be guaranteed that it has not been purposely or unconsciously manipulated. Every effort has been made to obtain as much relevant data as possible for this evaluation. It is important to note that the conclusions that lead to this procedure are derived in large part from the available data. Always take into account that the treatment will also be dependent on availability of resources and existing treatment guidelines, considered by other Pain Management Practitioners as being common knowledge and practice, at the time of the intervention. For Medico-Legal purposes, it is also important to point out that variation in procedural techniques and pharmacological choices are the acceptable norm. The indications, contraindications, technique, and results of the above procedure should only be interpreted and judged by a Board-Certified Interventional Pain Specialist with extensive familiarity and expertise in the same exact procedure and technique.

## 2017-12-25 ENCOUNTER — Encounter: Payer: Self-pay | Admitting: Pain Medicine

## 2017-12-25 ENCOUNTER — Other Ambulatory Visit: Payer: Self-pay

## 2017-12-25 ENCOUNTER — Other Ambulatory Visit: Payer: Self-pay | Admitting: Family Medicine

## 2017-12-25 ENCOUNTER — Ambulatory Visit
Admission: RE | Admit: 2017-12-25 | Discharge: 2017-12-25 | Disposition: A | Discharge status: Home / Self Care | Payer: Medicare Other | Source: Ambulatory Visit | Attending: Pain Medicine | Admitting: Pain Medicine

## 2017-12-25 ENCOUNTER — Ambulatory Visit (HOSPITAL_BASED_OUTPATIENT_CLINIC_OR_DEPARTMENT_OTHER): Payer: Medicare Other | Admitting: Pain Medicine

## 2017-12-25 VITALS — BP 117/72 | HR 78 | Temp 97.8°F | Resp 16 | Ht 70.0 in | Wt 240.0 lb

## 2017-12-25 DIAGNOSIS — Z9889 Other specified postprocedural states: Secondary | ICD-10-CM | POA: Insufficient documentation

## 2017-12-25 DIAGNOSIS — M1712 Unilateral primary osteoarthritis, left knee: Secondary | ICD-10-CM | POA: Diagnosis not present

## 2017-12-25 DIAGNOSIS — M25562 Pain in left knee: Secondary | ICD-10-CM | POA: Insufficient documentation

## 2017-12-25 DIAGNOSIS — Z881 Allergy status to other antibiotic agents status: Secondary | ICD-10-CM | POA: Diagnosis not present

## 2017-12-25 DIAGNOSIS — M25561 Pain in right knee: Secondary | ICD-10-CM | POA: Insufficient documentation

## 2017-12-25 DIAGNOSIS — G8929 Other chronic pain: Secondary | ICD-10-CM | POA: Diagnosis not present

## 2017-12-25 MED ORDER — MIDAZOLAM HCL 5 MG/5ML IJ SOLN
1.0000 mg | INTRAMUSCULAR | Status: DC | PRN
  Administered 2017-12-25: 3 mg via INTRAVENOUS
  Filled 2017-12-25: qty 5

## 2017-12-25 MED ORDER — ROPIVACAINE HCL 2 MG/ML IJ SOLN
9.0000 mL | Freq: Once | INTRAMUSCULAR | Status: AC
  Administered 2017-12-25: 9 mL
  Filled 2017-12-25: qty 10

## 2017-12-25 MED ORDER — LIDOCAINE HCL 2 % IJ SOLN
20.0000 mL | Freq: Once | INTRAMUSCULAR | Status: AC
  Administered 2017-12-25: 400 mg
  Filled 2017-12-25: qty 40

## 2017-12-25 MED ORDER — METHYLPREDNISOLONE ACETATE 80 MG/ML IJ SUSP
80.0000 mg | Freq: Once | INTRAMUSCULAR | Status: AC
  Administered 2017-12-25: 80 mg
  Filled 2017-12-25: qty 1

## 2017-12-25 MED ORDER — FENTANYL CITRATE (PF) 100 MCG/2ML IJ SOLN
25.0000 ug | INTRAMUSCULAR | Status: DC | PRN
  Administered 2017-12-25: 100 ug via INTRAVENOUS
  Filled 2017-12-25: qty 2

## 2017-12-25 MED ORDER — LACTATED RINGERS IV SOLN: 1000.0000 mL | Freq: Once | INTRAVENOUS | Status: DC

## 2017-12-25 NOTE — Progress Notes (Signed)
Safety precautions to be maintained throughout the outpatient stay will include: orient to surroundings, keep bed in low position, maintain call bell within reach at all times, provide assistance with transfer out of bed and ambulation.  

## 2017-12-25 NOTE — Patient Instructions (Signed)

## 2017-12-26 ENCOUNTER — Telehealth: Payer: Self-pay | Admitting: *Deleted

## 2017-12-26 NOTE — Telephone Encounter (Signed)
Attempted to call for post procedure follow-up. Message left. 

## 2017-12-29 ENCOUNTER — Telehealth: Payer: Self-pay | Admitting: *Deleted

## 2017-12-29 NOTE — Telephone Encounter (Signed)
No relief of pain after genicular NB. Patient advised too soon to see effects from steroid. Will discuss at follow-up.

## 2017-12-30 ENCOUNTER — Other Ambulatory Visit: Payer: Self-pay | Admitting: Internal Medicine

## 2017-12-30 ENCOUNTER — Encounter (HOSPITAL_COMMUNITY): Admission: RE | Payer: Self-pay | Source: Ambulatory Visit

## 2017-12-30 ENCOUNTER — Inpatient Hospital Stay (HOSPITAL_COMMUNITY): Admission: RE | Admit: 2017-12-30 | Payer: Medicare Other | Source: Ambulatory Visit | Admitting: Orthopedic Surgery

## 2017-12-30 SURGERY — ARTHROPLASTY, KNEE, UNICOMPARTMENTAL
Anesthesia: Choice | Site: Knee | Laterality: Left

## 2018-01-05 ENCOUNTER — Telehealth: Payer: Self-pay | Admitting: Orthopedic Surgery

## 2018-01-05 DIAGNOSIS — M25562 Pain in left knee: Secondary | ICD-10-CM | POA: Diagnosis not present

## 2018-01-05 NOTE — Telephone Encounter (Signed)
From chart review it looks like you were treating him and Joseph [name] was also treating him for the knee, Dr Eulah PontMurphy (Tim) had him scheduled for partial knee replacement, but this was cancelled. He is also in pain management for same knee  Do you want to see him, or [name]?

## 2018-01-05 NOTE — Telephone Encounter (Signed)
Patient is asking to be seen by Dr. Romeo AppleHarrison again for his left knee. [facility] was [facility] out to [facility] (Dr. Charlett BlakeVoytek) so he could get a type of synvisc injection. Patient states he has had several gel injections and it has not helped. He would like to come back to see Dr. Romeo AppleHarrison.  Please advise.

## 2018-01-05 NOTE — Telephone Encounter (Signed)
Ok just let him know I dont do partial knee

## 2018-01-06 NOTE — Telephone Encounter (Signed)
Ok to make appointment if patient interested in Total knee replacement, but Dr Romeo AppleHarrison does not do partial knee replacements.

## 2018-01-07 ENCOUNTER — Ambulatory Visit (INDEPENDENT_AMBULATORY_CARE_PROVIDER_SITE_OTHER): Payer: Medicare Other | Admitting: Primary Care

## 2018-01-07 ENCOUNTER — Encounter: Payer: Self-pay | Admitting: Primary Care

## 2018-01-07 VITALS — BP 122/82 | HR 79 | Temp 98.4°F | Wt 235.5 lb

## 2018-01-07 DIAGNOSIS — B360 Pityriasis versicolor: Secondary | ICD-10-CM

## 2018-01-07 DIAGNOSIS — L989 Disorder of the skin and subcutaneous tissue, unspecified: Secondary | ICD-10-CM | POA: Diagnosis not present

## 2018-01-07 DIAGNOSIS — B354 Tinea corporis: Secondary | ICD-10-CM | POA: Insufficient documentation

## 2018-01-07 HISTORY — DX: Tinea corporis: B35.4

## 2018-01-07 HISTORY — DX: Pityriasis versicolor: B36.0

## 2018-01-07 MED ORDER — FLUCONAZOLE 150 MG PO TABS: ORAL_TABLET | ORAL | 0 refills | Status: DC

## 2018-01-07 NOTE — Assessment & Plan Note (Addendum)
Noted to entire scalp.  Rx for fluconazole 300 mg one weekly x 2 weeks sent to pharmacy. Also discussed to use Selsun Blue shampoo when showering. Continue to monitor.

## 2018-01-07 NOTE — Assessment & Plan Note (Signed)
Noted to bilateral upper extremities, improved since last visit with ciclopirox but no resolve. Rx for fluconazole 300 mg one weekly x 2 weeks sent to pharmacy. Also discussed to use Selsun Blue shampoo when showering. Continue to monitor.

## 2018-01-07 NOTE — Assessment & Plan Note (Signed)
Still remain to anterior chest and occipital head. Treating with oral fluconazole for tinea corporis and versicolor. If no improvement then will send to dermatology to rule out etiology.

## 2018-01-07 NOTE — Progress Notes (Signed)
Subjective:    Patient ID: Joseph [name], male    DOB: 04-20-1971, 47 y.o.   MRN: [id number]  HPI  Joseph [name] is a 47 year old male with a history of asthma, COPD, OSA, GERD, diabetes, chronic rash who presents today with a chief complaint of nasal congestion and rash.   He also reports headaches. He's taken Sudafed and is feeling better.  He also reports rash which is his main concern today. His rash is located to the bilateral left posterior forearm, anterior chest, scalp, and posterior occipital head. He's been picking at the rash to his chest and upper back. These spots have been chronic for months to one year.   He's been using ciclopirox cream that was prescribed last visit for fungal appearing rashes with some improvement but no resolve. He denies erythema, swelling, drainage, fevers.   Review of Systems  Constitutional: Negative for fever.  HENT: Positive for congestion.   Respiratory: Negative for cough.   Skin: Positive for rash.  Neurological: Positive for headaches.       Past Medical History:  Diagnosis Date  . Anxiety   . Arthritis   . Asthma   . Chronic back pain   . Chronic pain of left knee   . COPD (chronic obstructive pulmonary disease) (HCC)   . Diabetes mellitus   . GERD (gastroesophageal reflux disease)   . Shortness of breath   . Sleep apnea      Social History   Socioeconomic History  . Marital status: Legally Separated    Spouse name: Not on file  . Number of children: Not on file  . Years of education: Not on file  . Highest education level: Not on file  Social Needs  . Financial resource strain: Not on file  . Food insecurity - worry: Not on file  . Food insecurity - inability: Not on file  . Transportation needs - medical: Not on file  . Transportation needs - non-medical: Not on file  Occupational History  . Occupation: disability  Tobacco Use  . Smoking status: Current Every Day Smoker    Packs/day: 1.50    Years: 20.00    Pack  years: 30.00    Types: Cigarettes    Start date: 12/17/1993  . Smokeless tobacco: Former Neurosurgeon  . Tobacco comment: 1ppd as of 11/06/17 ep  Substance and Sexual Activity  . Alcohol use: No    Alcohol/week: 0.0 oz  . Drug use: No  . Sexual activity: Yes    Birth control/protection: Surgical  Other Topics Concern  . Not on file  Social History Narrative   Recent visit to [facility] in [place] d/t increased pain where he was prescribed percocet 7.5-325 mg for pain.    Past Surgical History:  Procedure Laterality Date  . CARPAL TUNNEL RELEASE Left   . CATARACT EXTRACTION W/PHACO Right 03/11/2016   Procedure: CATARACT EXTRACTION PHACO AND INTRAOCULAR LENS PLACEMENT (IOC);  Surgeon: Gemma Payor, MD;  Location: [facility];  Service: Ophthalmology;  Laterality: Right;  CDE 4.24  . HERNIA REPAIR Bilateral   . KNEE ARTHROSCOPY WITH MEDIAL MENISECTOMY Left 04/22/2014   Procedure: LEFT KNEE ARTHROSCOPY WITH MEDIAL MENISECTOMY;  Surgeon: Vickki Hearing, MD;  Location: [facility];  Service: Orthopedics;  Laterality: Left;  . KNEE ARTHROSCOPY WITH MEDIAL MENISECTOMY Right 12/06/2015   Procedure: RIGHT KNEE ARTHROSCOPY WITH MEDIAL MENISECTOMY;  Surgeon: Vickki Hearing, MD;  Location: [facility];  Service: Orthopedics;  Laterality: Right;  . KNEE  ARTHROSCOPY WITH MEDIAL MENISECTOMY Left 03/20/2017   Procedure: KNEE ARTHROSCOPY WITH MEDIAL MENISECTOMY;  Surgeon: Vickki HearingStanley E Harrison, MD;  Location: [facility];  Service: Orthopedics;  Laterality: Left;  . ORIF WRIST FRACTURE Left 12/19/2016   Procedure: OPEN REDUCTION INTERNAL FIXATION (ORIF) WRIST FRACTURE;  Surgeon: Betha LoaKevin Kuzma, MD;  Location: [facility];  Service: Orthopedics;  Laterality: Left;  accumed   . SHOULDER ARTHROCENTESIS Right   . VASECTOMY      Family History  Problem Relation Age of Onset  . Emphysema Maternal Grandfather   . Emphysema Maternal Grandmother   . Clotting disorder Maternal Grandmother     Allergies  Allergen Reactions  .  Azithromycin Hives  . Erythromycin Hives  . Keflex [Cephalexin] Nausea Only  . Metformin Nausea And Vomiting  . Symbicort [Budesonide-Formoterol Fumarate] Other (See Comments)    States that 3 doses were used and breathing became worse    Current Outpatient Medications on File Prior to Visit  Medication Sig Dispense Refill  . albuterol (PROVENTIL) (2.5 MG/3ML) 0.083% nebulizer solution USE 1 VIAL VIA NEBULIZER FOUR TIMES DAILY AS NEEDED FOR SHORTNESS OF BREATH 360 mL 0  . AMITIZA 24 MCG capsule TAKE ONE TABLET BY MOUTH TWICE A DAY 180 capsule 1  . atorvastatin (LIPITOR) 20 MG tablet Take 1 tablet (20 mg total) by mouth every evening. 90 tablet 3  . buPROPion (WELLBUTRIN SR) 150 MG 12 hr tablet Take 1 tablet (150 mg total) by mouth 2 (two) times daily. 180 tablet 1  . celecoxib (CELEBREX) 100 MG capsule Take 100 mg by mouth 2 (two) times daily.    . ciclopirox (LOPROX) 0.77 % cream Apply topically 2 (two) times daily. 30 g 0  . CINNAMON PO Take 2,800 mg by mouth daily. EACH TABLET IS 1400 MG.  TAKES 2 CAPSULES DAILY    . citalopram (CELEXA) 40 MG tablet TAKE ONE (1) TABLET BY MOUTH EVERY DAY 30 tablet 11  . Continuous Blood Gluc Receiver (FREESTYLE LIBRE 14 DAY READER) DEVI 1 Device by Does not apply route 3 (three) times daily. 1 Device 0  . Continuous Blood Gluc Sensor (FREESTYLE LIBRE 14 DAY SENSOR) MISC 1 Device by Does not apply route 3 (three) times daily. 1 each 0  . diphenhydrAMINE (BENADRYL) 25 MG tablet Take 25 mg by mouth 2 (two) times daily.    . empagliflozin (JARDIANCE) 10 MG TABS tablet Take 10 mg by mouth every morning. 90 tablet 2  . fluticasone furoate-vilanterol (BREO ELLIPTA) 200-25 MCG/INH AEPB Inhale 1 puff into the lungs daily. 30 each 11  . glipiZIDE (GLUCOTROL) 5 MG tablet TAKE TWO TABLETS BY MOUTH TWICE DAILY 360 tablet 1  . ipratropium (ATROVENT) 0.02 % nebulizer solution USE 1 VIAL IN NEBULIZER FOUR TIMES DAILY AS NEEDED 300 mL 0  . Ipratropium-Albuterol  (COMBIVENT) 20-100 MCG/ACT AERS respimat Inhale 1 puff into the lungs every 6 (six) hours as needed for wheezing. 1 Inhaler 2  . lansoprazole (PREVACID) 30 MG capsule TAKE ONE CAPSULE BY MOUTH TWICE A DAY BEFORE A MEAL 180 capsule 1  . metFORMIN (GLUCOPHAGE-XR) 500 MG 24 hr tablet TAKE 1 TABLET DAILY WITH BREAKFAST FOR 2WEEKS, THEN ADVANCE TO 2 TABLETS WITH BREAKFAST THEREAFTER 180 tablet 1  . Multiple Vitamin (MULTIVITAMIN WITH MINERALS) TABS Take 1 tablet by mouth every morning. Men's once daily multivitamin packet (vitamin e, calcium, ginseng)    . Saw Palmetto, Serenoa repens, (SAW PALMETTO PO) Take 1 capsule by mouth daily.    .Marland Kitchen  tamsulosin (FLOMAX) 0.4 MG CAPS capsule TAKE ONE (1) CAPSULE EACH DAY 90 capsule 1  . tapentadol (NUCYNTA) 50 MG tablet Take 50 mg by mouth 3 (three) times daily.    . tapentadol (NUCYNTA) 50 MG tablet Take 1 tablet (50 mg total) by mouth every 8 (eight) hours as needed for severe pain. Do not take if you have epilepsy or a history of seizures. Swallow tablets whole. Do not chew, crush or dissolve. 90 tablet 0  . testosterone (ANDROGEL) 50 MG/5GM (1%) GEL Place 5 g onto the skin daily.    . traZODone (DESYREL) 150 MG tablet Take 1 tablet (150 mg total) by mouth at bedtime. 90 tablet 1  . triamcinolone cream (KENALOG) 0.1 % Apply 1 application topically 2 (two) times daily. 45 g 0   No current facility-administered medications on file prior to visit.     BP 122/82   Pulse 79   Temp 98.4 F (36.9 C) (Oral)   Wt 235 lb 8 oz (106.8 kg)   SpO2 98%   BMI 33.79 kg/m    Objective:   Physical Exam  Constitutional: He appears well-nourished.  Neck: Neck supple.  Cardiovascular: Normal rate and regular rhythm.  Pulmonary/Chest: Effort normal and breath sounds normal.  Skin: Skin is warm and dry.  Round, mildly flesh colored, mildly raised patches to bilateral upper extremities, scabbed over lesion to mid anterior chest and posterior scalp. Whitish patches to entire  scalp.          Assessment & Plan:

## 2018-01-07 NOTE — Patient Instructions (Addendum)
Start fluconazole tablets for rash. Take 2 tablets by mouth today, then repeat with 2 tablets in one week.  Start washing your body in Selsun Blue shampoo three times weekly. Leave on for 5 minutes before washing off.  Schedule an office visit with me after March 17th for A1C recheck and diabetes follow up.  It was a pleasure to see you today!

## 2018-01-12 ENCOUNTER — Ambulatory Visit: Payer: Medicare Other | Attending: Pain Medicine | Admitting: Pain Medicine

## 2018-01-12 ENCOUNTER — Encounter: Payer: Self-pay | Admitting: Pain Medicine

## 2018-01-12 VITALS — BP 132/80 | HR 88 | Temp 98.7°F | Resp 18 | Ht 70.0 in | Wt 230.0 lb

## 2018-01-12 DIAGNOSIS — Z9889 Other specified postprocedural states: Secondary | ICD-10-CM | POA: Insufficient documentation

## 2018-01-12 DIAGNOSIS — Z832 Family history of diseases of the blood and blood-forming organs and certain disorders involving the immune mechanism: Secondary | ICD-10-CM | POA: Insufficient documentation

## 2018-01-12 DIAGNOSIS — F329 Major depressive disorder, single episode, unspecified: Secondary | ICD-10-CM | POA: Diagnosis not present

## 2018-01-12 DIAGNOSIS — K5909 Other constipation: Secondary | ICD-10-CM | POA: Diagnosis not present

## 2018-01-12 DIAGNOSIS — T402X5A Adverse effect of other opioids, initial encounter: Secondary | ICD-10-CM | POA: Insufficient documentation

## 2018-01-12 DIAGNOSIS — F17210 Nicotine dependence, cigarettes, uncomplicated: Secondary | ICD-10-CM | POA: Diagnosis not present

## 2018-01-12 DIAGNOSIS — E785 Hyperlipidemia, unspecified: Secondary | ICD-10-CM | POA: Insufficient documentation

## 2018-01-12 DIAGNOSIS — Z825 Family history of asthma and other chronic lower respiratory diseases: Secondary | ICD-10-CM | POA: Diagnosis not present

## 2018-01-12 DIAGNOSIS — Z79899 Other long term (current) drug therapy: Secondary | ICD-10-CM | POA: Insufficient documentation

## 2018-01-12 DIAGNOSIS — M25561 Pain in right knee: Secondary | ICD-10-CM

## 2018-01-12 DIAGNOSIS — S83202S Bucket-handle tear of unspecified meniscus, current injury, unspecified knee, sequela: Secondary | ICD-10-CM | POA: Diagnosis not present

## 2018-01-12 DIAGNOSIS — Z9841 Cataract extraction status, right eye: Secondary | ICD-10-CM | POA: Insufficient documentation

## 2018-01-12 DIAGNOSIS — K5903 Drug induced constipation: Secondary | ICD-10-CM | POA: Diagnosis not present

## 2018-01-12 DIAGNOSIS — Z79891 Long term (current) use of opiate analgesic: Secondary | ICD-10-CM | POA: Diagnosis not present

## 2018-01-12 DIAGNOSIS — G4700 Insomnia, unspecified: Secondary | ICD-10-CM | POA: Diagnosis not present

## 2018-01-12 DIAGNOSIS — S83200S Bucket-handle tear of unspecified meniscus, current injury, right knee, sequela: Secondary | ICD-10-CM | POA: Diagnosis not present

## 2018-01-12 DIAGNOSIS — M25532 Pain in left wrist: Secondary | ICD-10-CM

## 2018-01-12 DIAGNOSIS — G4733 Obstructive sleep apnea (adult) (pediatric): Secondary | ICD-10-CM | POA: Diagnosis not present

## 2018-01-12 DIAGNOSIS — Z7984 Long term (current) use of oral hypoglycemic drugs: Secondary | ICD-10-CM | POA: Insufficient documentation

## 2018-01-12 DIAGNOSIS — F1190 Opioid use, unspecified, uncomplicated: Secondary | ICD-10-CM

## 2018-01-12 DIAGNOSIS — J449 Chronic obstructive pulmonary disease, unspecified: Secondary | ICD-10-CM | POA: Insufficient documentation

## 2018-01-12 DIAGNOSIS — M25562 Pain in left knee: Secondary | ICD-10-CM

## 2018-01-12 DIAGNOSIS — E119 Type 2 diabetes mellitus without complications: Secondary | ICD-10-CM | POA: Insufficient documentation

## 2018-01-12 DIAGNOSIS — G894 Chronic pain syndrome: Secondary | ICD-10-CM | POA: Diagnosis not present

## 2018-01-12 DIAGNOSIS — M1712 Unilateral primary osteoarthritis, left knee: Secondary | ICD-10-CM | POA: Insufficient documentation

## 2018-01-12 DIAGNOSIS — G8929 Other chronic pain: Secondary | ICD-10-CM | POA: Diagnosis not present

## 2018-01-12 DIAGNOSIS — F419 Anxiety disorder, unspecified: Secondary | ICD-10-CM | POA: Insufficient documentation

## 2018-01-12 DIAGNOSIS — K219 Gastro-esophageal reflux disease without esophagitis: Secondary | ICD-10-CM | POA: Diagnosis not present

## 2018-01-12 DIAGNOSIS — Z881 Allergy status to other antibiotic agents status: Secondary | ICD-10-CM | POA: Insufficient documentation

## 2018-01-12 MED ORDER — TAPENTADOL HCL 50 MG PO TABS: 50.0000 mg | ORAL_TABLET | Freq: Three times a day (TID) | ORAL | 0 refills | Status: DC | PRN

## 2018-01-12 MED ORDER — LUBIPROSTONE 24 MCG PO CAPS: 24.0000 ug | ORAL_CAPSULE | Freq: Two times a day (BID) | ORAL | 2 refills | Status: DC

## 2018-01-12 NOTE — Patient Instructions (Addendum)
____________________________________________________________________________________________  DRUG HOLIDAYS  What is a "Drug Holiday"? Drug Holiday: is the name given to the period of time during which a patient stops taking a medication(s) for the purpose of eliminating tolerance to the drug.  Benefits . Improved effectiveness of opioids. . Decreased opioid dose needed to achieve benefits. . Improved pain with lesser dose.  What is tolerance? Tolerance: is the progressive decreased in effectiveness of a drug due to its repetitive use. With repetitive use, the body gets use to the medication and as a consequence, it loses its effectiveness. This is a common problem seen with opioid pain medications. As a result, a larger dose of the drug is needed to achieve the same effect that used to be obtained with a smaller dose.  How long should a "Drug Holiday" last? At least 14 consecutive days. (2 weeks)  What are withdrawals? Withdrawals: refers to the wide range of symptoms that occur after stopping or dramatically reducing opiate drugs after heavy and prolonged use. Withdrawal symptoms do not occur to patients that use low dose opioids, or those who take the medication sporadically. Contrary to benzodiazepine (example: Valium, Xanax, etc.) or alcohol withdrawals ("Delirium Tremens"), opioid withdrawals are not lethal. Withdrawals are the physical manifestation of the body getting rid of the excess receptors.  Expected Symptoms Early symptoms of withdrawal may include: . Agitation . Anxiety . Muscle aches . Increased tearing . Insomnia . Runny nose . Sweating . Yawning  Late symptoms of withdrawal may include: . Abdominal cramping . Diarrhea . Dilated pupils . Goose bumps . Nausea . Vomiting  Will I experience withdrawals? Due to the slow nature of the taper, it is very unlikely that you will experience any.  What is a slow taper? Taper: refers to the gradual decrease in dose.  ___________________________________________________________________________________________ ____________________________________________________________________________________________  Pain Scale  Introduction: The pain score used by this practice is the Verbal Numerical Rating Scale (VNRS-11). This is an 11-point scale. It is for adults and children 10 years or older. There are significant differences in how the pain score is reported, used, and applied. Forget everything you learned in the past and learn this scoring system.  General Information: The scale should reflect your current level of pain. Unless you are specifically asked for the level of your worst pain, or your average pain. If you are asked for one of these two, then it should be understood that it is over the past 24 hours.  Basic Activities of Daily Living (ADL): Personal hygiene, dressing, eating, transferring, and using restroom.  Instructions: Most patients tend to report their level of pain as a combination of two factors, their physical pain and their psychosocial pain. This last one is also known as "suffering" and it is reflection of how physical pain affects you socially and psychologically. From now on, report them separately. From this point on, when asked to report your pain level, report only your physical pain. Use the following table for reference.  Pain Clinic Pain Levels (0-5/10)  Pain Level Score  Description  No Pain 0   Mild pain 1 Nagging, annoying, but does not interfere with basic activities of daily living (ADL). Patients are able to eat, bathe, get dressed, toileting (being able to get on and off the toilet and perform personal hygiene functions), transfer (move in and out of bed or a chair without assistance), and maintain continence (able to control bladder and bowel functions). Blood pressure and heart rate are unaffected. A normal heart rate for  a healthy adult ranges from 60 to 100 bpm (beats per  minute).   Mild to moderate pain 2 Noticeable and distracting. Impossible to hide from other people. More frequent flare-ups. Still possible to adapt and function close to normal. It can be very annoying and may have occasional stronger flare-ups. With discipline, patients may get used to it and adapt.   Moderate pain 3 Interferes significantly with activities of daily living (ADL). It becomes difficult to feed, bathe, get dressed, get on and off the toilet or to perform personal hygiene functions. Difficult to get in and out of bed or a chair without assistance. Very distracting. With effort, it can be ignored when deeply involved in activities.   Moderately severe pain 4 Impossible to ignore for more than a few minutes. With effort, patients may still be able to manage work or participate in some social activities. Very difficult to concentrate. Signs of autonomic nervous system discharge are evident: dilated pupils (mydriasis); mild sweating (diaphoresis); sleep interference. Heart rate becomes elevated (>115 bpm). Diastolic blood pressure (lower number) rises above 100 mmHg. Patients find relief in laying down and not moving.   Severe pain 5 Intense and extremely unpleasant. Associated with frowning face and frequent crying. Pain overwhelms the senses.  Ability to do any activity or maintain social relationships becomes significantly limited. Conversation becomes difficult. Pacing back and forth is common, as getting into a comfortable position is nearly impossible. Pain wakes you up from deep sleep. Physical signs will be obvious: pupillary dilation; increased sweating; goosebumps; brisk reflexes; cold, clammy hands and feet; nausea, vomiting or dry heaves; loss of appetite; significant sleep disturbance with inability to fall asleep or to remain asleep. When persistent, significant weight loss is observed due to the complete loss of appetite and sleep deprivation.  Blood pressure and heart rate  becomes significantly elevated. Caution: If elevated blood pressure triggers a pounding headache associated with blurred vision, then the patient should immediately seek attention at an urgent or emergency care unit, as these may be signs of an impending stroke.    Emergency Department Pain Levels (6-10/10)  Emergency Room Pain 6 Severely limiting. Requires emergency care and should not be seen or managed at an outpatient pain management facility. Communication becomes difficult and requires great effort. Assistance to reach the emergency department may be required. Facial flushing and profuse sweating along with potentially dangerous increases in heart rate and blood pressure will be evident.   Distressing pain 7 Self-care is very difficult. Assistance is required to transport, or use restroom. Assistance to reach the emergency department will be required. Tasks requiring coordination, such as bathing and getting dressed become very difficult.   Disabling pain 8 Self-care is no longer possible. At this level, pain is disabling. The individual is unable to do even the most "basic" activities such as walking, eating, bathing, dressing, transferring to a bed, or toileting. Fine motor skills are lost. It is difficult to think clearly.   Incapacitating pain 9 Pain becomes incapacitating. Thought processing is no longer possible. Difficult to remember your own name. Control of movement and coordination are lost.   The worst pain imaginable 10 At this level, most patients pass out from pain. When this level is reached, collapse of the autonomic nervous system occurs, leading to a sudden drop in blood pressure and heart rate. This in turn results in a temporary and dramatic drop in blood flow to the brain, leading to a loss of consciousness. Fainting is one of  the body's self defense mechanisms. Passing out puts the brain in a calmed state and causes it to shut down for a while, in order to begin the healing  process.    Summary: 1. Refer to this scale when providing [place] with your pain level. 2. Be accurate and careful when reporting your pain level. This will help with your care. 3. Over-reporting your pain level will lead to loss of credibility. 4. Even a level of 1/10 means that there is pain and will be treated at our facility. 5. High, inaccurate reporting will be documented as "Symptom Exaggeration", leading to loss of credibility and suspicions of possible secondary gains such as obtaining more narcotics, or wanting to appear disabled, for fraudulent reasons. 6. Only pain levels of 5 or below will be seen at our facility. 7. Pain levels of 6 and above will be sent to the Emergency Department and the appointment cancelled. ____________________________________________________________________________________________   ____________________________________________________________________________________________  Preparing for Procedure with Sedation Instructions: . Oral Intake: Do not eat or drink anything for at least 8 hours prior to your procedure. . Transportation: Public transportation is not allowed. Bring an adult driver. The driver must be physically present in our waiting room before any procedure can be started. [facility] Physical Assistance: Bring an adult physically capable of assisting you, in the event you need help. This adult should keep you company at home for at least 6 hours after the procedure. . Blood Pressure Medicine: Take your blood pressure medicine with a sip of water the morning of the procedure. . Blood thinners:  . Diabetics on insulin: Notify the staff so that you can be scheduled 1st case in the morning. If your diabetes requires high dose insulin, take only  of your normal insulin dose the morning of the procedure and notify the staff that you have done so. . Preventing infections: Shower with an antibacterial soap the morning of your procedure. . Build-up your immune  system: Take 1000 mg of Vitamin C with every meal (3 times a day) the day prior to your procedure. [facility] Antibiotics: Inform the staff if you have a condition or reason that requires you to take antibiotics before dental procedures. . Pregnancy: If you are pregnant, call and cancel the procedure. . Sickness: If you have a cold, fever, or any active infections, call and cancel the procedure. . Arrival: You must be in the facility at least 30 minutes prior to your scheduled procedure. . Children: Do not bring children with you. . Dress appropriately: Bring dark clothing that you would not mind if they get stained. . Valuables: Do not bring any jewelry or valuables. Procedure appointments are reserved for interventional treatments only. [facility] No Prescription Refills. . No medication changes will be discussed during procedure appointments. . No disability issues will be discussed. ____________________________________________________________________________________________   ____________________________________________________________________________________________  Pain Scale  Introduction: The pain score used by this practice is the Verbal Numerical Rating Scale (VNRS-11). This is an 11-point scale. It is for adults and children 10 years or older. There are significant differences in how the pain score is reported, used, and applied. Forget everything you learned in the past and learn this scoring system.  General Information: The scale should reflect your current level of pain. Unless you are specifically asked for the level of your worst pain, or your average pain. If you are asked for one of these two, then it should be understood that it is over the past 24 hours.  Basic Activities of  Daily Living (ADL): Personal hygiene, dressing, eating, transferring, and using restroom.  Instructions: Most patients tend to report their level of pain as a combination of two factors, their physical pain and their  psychosocial pain. This last one is also known as "suffering" and it is reflection of how physical pain affects you socially and psychologically. From now on, report them separately. From this point on, when asked to report your pain level, report only your physical pain. Use the following table for reference.  Pain Clinic Pain Levels (0-5/10)  Pain Level Score  Description  No Pain 0   Mild pain 1 Nagging, annoying, but does not interfere with basic activities of daily living (ADL). Patients are able to eat, bathe, get dressed, toileting (being able to get on and off the toilet and perform personal hygiene functions), transfer (move in and out of bed or a chair without assistance), and maintain continence (able to control bladder and bowel functions). Blood pressure and heart rate are unaffected. A normal heart rate for a healthy adult ranges from 60 to 100 bpm (beats per minute).   Mild to moderate pain 2 Noticeable and distracting. Impossible to hide from other people. More frequent flare-ups. Still possible to adapt and function close to normal. It can be very annoying and may have occasional stronger flare-ups. With discipline, patients may get used to it and adapt.   Moderate pain 3 Interferes significantly with activities of daily living (ADL). It becomes difficult to feed, bathe, get dressed, get on and off the toilet or to perform personal hygiene functions. Difficult to get in and out of bed or a chair without assistance. Very distracting. With effort, it can be ignored when deeply involved in activities.   Moderately severe pain 4 Impossible to ignore for more than a few minutes. With effort, patients may still be able to manage work or participate in some social activities. Very difficult to concentrate. Signs of autonomic nervous system discharge are evident: dilated pupils (mydriasis); mild sweating (diaphoresis); sleep interference. Heart rate becomes elevated (>115 bpm). Diastolic blood  pressure (lower number) rises above 100 mmHg. Patients find relief in laying down and not moving.   Severe pain 5 Intense and extremely unpleasant. Associated with frowning face and frequent crying. Pain overwhelms the senses.  Ability to do any activity or maintain social relationships becomes significantly limited. Conversation becomes difficult. Pacing back and forth is common, as getting into a comfortable position is nearly impossible. Pain wakes you up from deep sleep. Physical signs will be obvious: pupillary dilation; increased sweating; goosebumps; brisk reflexes; cold, clammy hands and feet; nausea, vomiting or dry heaves; loss of appetite; significant sleep disturbance with inability to fall asleep or to remain asleep. When persistent, significant weight loss is observed due to the complete loss of appetite and sleep deprivation.  Blood pressure and heart rate becomes significantly elevated. Caution: If elevated blood pressure triggers a pounding headache associated with blurred vision, then the patient should immediately seek attention at an urgent or emergency care unit, as these may be signs of an impending stroke.    Emergency Department Pain Levels (6-10/10)  Emergency Room Pain 6 Severely limiting. Requires emergency care and should not be seen or managed at an outpatient pain management facility. Communication becomes difficult and requires great effort. Assistance to reach the emergency department may be required. Facial flushing and profuse sweating along with potentially dangerous increases in heart rate and blood pressure will be evident.   Distressing pain 7  Self-care is very difficult. Assistance is required to transport, or use restroom. Assistance to reach the emergency department will be required. Tasks requiring coordination, such as bathing and getting dressed become very difficult.   Disabling pain 8 Self-care is no longer possible. At this level, pain is disabling. The  individual is unable to do even the most "basic" activities such as walking, eating, bathing, dressing, transferring to a bed, or toileting. Fine motor skills are lost. It is difficult to think clearly.   Incapacitating pain 9 Pain becomes incapacitating. Thought processing is no longer possible. Difficult to remember your own name. Control of movement and coordination are lost.   The worst pain imaginable 10 At this level, most patients pass out from pain. When this level is reached, collapse of the autonomic nervous system occurs, leading to a sudden drop in blood pressure and heart rate. This in turn results in a temporary and dramatic drop in blood flow to the brain, leading to a loss of consciousness. Fainting is one of the body's self defense mechanisms. Passing out puts the brain in a calmed state and causes it to shut down for a while, in order to begin the healing process.    Summary: 8. Refer to this scale when providing [place] with your pain level. 9. Be accurate and careful when reporting your pain level. This will help with your care. 10. Over-reporting your pain level will lead to loss of credibility. 11. Even a level of 1/10 means that there is pain and will be treated at our facility. 12. High, inaccurate reporting will be documented as "Symptom Exaggeration", leading to loss of credibility and suspicions of possible secondary gains such as obtaining more narcotics, or wanting to appear disabled, for fraudulent reasons. 13. Only pain levels of 5 or below will be seen at our facility. 14. Pain levels of 6 and above will be sent to the Emergency Department and the appointment cancelled. ____________________________________________________________________________________________   ____________________________________________________________________________________________  Risk(s) and Possible Complications  Patient Responsibilities: It is important that you read this as it is part of  your informed consent. It is our duty to inform you of the risks and possible complications associated with treatments offered to you. It is your responsibility as a patient to read this and to ask questions about anything that is not clear or that you believe was not covered in this document.  Patient's Rights: You have the right to refuse treatment. You also have the right to change your mind, even after initially having agreed to have the treatment done. However, under this last option, if you wait until the last second to change your mind, you may be charged for the materials used up to that point.  Introduction: Medicine is not an Visual merchandiser. Everything in Medicine, including the lack of treatment(s), carries the potential for danger, harm, or loss (which is by definition: Risk). In Medicine, a complication is a secondary problem, condition, or disease that can aggravate an already existing one. All treatments carry the risk of possible complications. The fact that a side effects or complications occurs, does not imply that the treatment was conducted incorrectly. It must be clearly understood that these can happen even when everything is done following the highest safety standards.  No treatment: You can choose not to proceed with the proposed treatment alternative. The "PRO(s)" would include: avoiding the risk of complications associated with the therapy. The "CON(s)" would include: not getting any of the treatment benefits. These benefits fall  under one of three categories: diagnostic; therapeutic; and/or palliative. Diagnostic benefits include: getting information which can ultimately lead to improvement of the disease or symptom(s). Therapeutic benefits are those associated with the successful treatment of the disease. Finally, palliative benefits are those related to the decrease of the primary symptoms, without necessarily curing the condition (example: decreasing the pain from a flare-up of a  chronic condition, such as incurable terminal cancer).  General Risks and Complications: These are associated to most interventional treatments. They can occur alone, or in combination. They fall under one of the following six (6) categories: no benefit or worsening of symptoms; bleeding; infection; nerve damage; allergic reactions; and/or death. 1. No benefits or worsening of symptoms: In Medicine there are no guarantees, only probabilities. No healthcare provider can ever guarantee that a medical treatment will work, they can only state the probability that it may. Furthermore, there is always the possibility that the condition may worsen, either directly, or indirectly, as a consequence of the treatment. 2. Bleeding: This is more common if the patient is taking a blood thinner, either prescription or over the counter (example: Goody Powders, Fish oil, Aspirin, Garlic, etc.), or if suffering a condition associated with impaired coagulation (example: Hemophilia, cirrhosis of the liver, low platelet counts, etc.). However, even if you do not have one on these, it can still happen. If you have any of these conditions, or take one of these drugs, make sure to notify your treating physician. 3. Infection: This is more common in patients with a compromised immune system, either due to disease (example: diabetes, cancer, human immunodeficiency virus [HIV], etc.), or due to medications or treatments (example: therapies used to treat cancer and rheumatological diseases). However, even if you do not have one on these, it can still happen. If you have any of these conditions, or take one of these drugs, make sure to notify your treating physician. 4. Nerve Damage: This is more common when the treatment is an invasive one, but it can also happen with the use of medications, such as those used in the treatment of cancer. The damage can occur to small secondary nerves, or to large primary ones, such as those in the spinal  cord and brain. This damage may be temporary or permanent and it may lead to impairments that can range from temporary numbness to permanent paralysis and/or brain death. 5. Allergic Reactions: Any time a substance or material comes in contact with our body, there is the possibility of an allergic reaction. These can range from a mild skin rash (contact dermatitis) to a severe systemic reaction (anaphylactic reaction), which can result in death. 6. Death: In general, any medical intervention can result in death, most of the time due to an unforeseen complication. ____________________________________________________________________________________________   ____________________________________________________________________________________________  Genicular Nerve Block  What is a genicular nerve block? A genicular nerve block is the injection of a local anesthetic to block the nerves that transmits pain from the knee.  What is the purpose of a facet nerve block? A genicular nerve block is a diagnostic procedure to determine if the pathologic changes (i.e. arthritis, meniscal tears, etc) and inflammation within the knee joint is the source of your knee pain. It also confirms that the knee pain will respond well to the actual treatment procedure. If a genicular nerve block works, it will give you relief for several hours. After that, the pain is expected to return to normal. This test is always performed twice (usually a week or  two apart) because two successful tests are required to move onto treatment. If both diagnostic tests are positive, then we schedule a treatment called radiofrequency (RF) ablation. In this procedure, the same nerves are cauterized, which typically leads to pain relief for 4 -18 months. If this process works well for one knee, it can be performed on the other knee if needed.  How is the procedure performed? You will be placed on the procedure table. The injection site is  sterilized with either iodine or chlorhexadine. The site to be injected is numbed with a local anesthetic, and a needle is directed to the target area. X-ray guidance is used to ensure proper placement and positioning of the needle. When the needle is properly positioned near the genicular nerve, local anesthetic is injected to numb that nerve. This will be repeated at multiple sites around the knee to block all genicular nerves.  Will the procedure be painful? The injection can be painful and we therefore provide the option of receiving IV sedation. IV sedation, combined with local anesthetic, can make the injection nearly pain free. It allows you to remain very still during the procedure, which can also make the injection easier, faster, and more successful. If you decide to have IV sedation, you must have a driver to get you home safely afterwards. In addition, you cannot have anything to eat or drink within 8 hours of your appointment (clear liquids are allowed until 3 hours before the procedure). If you take medications for diabetes, these medications may need to be adjusted the morning of the procedure. Your primary care physician can help you with this adjustment.  What are the discharge instructions? If you received IV sedation do not drive or operate machinery for at least 24 hours after the procedure. You may return to work the next day following your procedure. You may resume your normal diet immediately. Do not engage in any strenuous activity for 24 hours. You should, however, engage in moderate activity that typically causes your ususal pain. If the block works, those activities should not be painful for several hours after the injection. Do not take a bath, swim, or use a hot tub for 24 hours (you may take a shower). Call the office if you have any of the following: severe pain afterwards (different than your usual symptoms), redness/swelling/discharge at the injection site(s), fevers/chills,  difficulty with bowel or bladder functions.  What are the risks and side effects? The complication rate for this procedure is very low. Whenever a needle enters the skin, bleeding or infection can occur. Some other serious but extremely rare risks include paralysis and death. You may have an allergic reaction to any of the medications used. If you have a known allergy to any medications, especially local anesthetics, notify our staff before the procedure takes place. You may experience any of the following side effects up to 4 - 6 hours after the procedure: . Leg muscle weakness or numbness may occur due to the local anesthetic affecting the nerves that control your legs (this is a temporary affect and it is not paralysis). If you have any leg weakness or numbness, walk only with assistance in order to prevent falls and injury. Your leg strength will return slowly and completely. . Dizziness may occur due to a decrease in your blood pressure. If this occurs, remain in a seated or lying position. Gradually sit up, and then stand after at least 10 minutes of sitting. . Mild headaches may occur. Drink  fluids and take pain medications if needed. If the headaches persist or become severe, call the office. . Mild discomfort at the injection site can occur. This typically lasts for a few hours but can persist for a couple days. If this occurs, take anti-inflammatories or pain medications, apply ice to the area the day of the procedure. If it persists, apply moist heat in the day(s) following.  The side effects listed above can be normal. They are not dangerous and will resolve on their own. If, however, you experience any of the following, a complication may have occurred and you should either contact your doctor. If he is not readily available, then you should proceed to the closest urgent care center for evaluation: . Severe or progressive pain at the injection site(s) . Arm or leg weakness that  progressively worsens or persists for longer than 8 hours . Severe or progressive redness, swelling, or discharge from the injections site(s) . Fevers, chills, nausea, or vomiting . Bowel or bladder dysfunction (i.e. inability to urinate or pass stool or difficulty controlling either)  How long does it take for the procedure to work? You should feel relief from your usual pain within the first hour. Again, this is only expected to last for several hours, at the most. Remember, you may be sore in the middle part of your back from the needles, and you must distinguish this from your usual pain. ____________________________________________________________________________________________   Knee Injection A knee injection is a procedure to get medicine into your knee joint. Your health care provider puts a needle into the joint and injects medicine with an attached syringe. The injected medicine may relieve the pain, swelling, and stiffness of arthritis. The injected medicine may also help to lubricate and cushion your knee joint. You may need more than one injection. Tell a health care provider about:  Any allergies you have.  All medicines you are taking, including vitamins, herbs, eye drops, creams, and over-the-counter medicines.  Any problems you or family members have had with anesthetic medicines.  Any blood disorders you have.  Any surgeries you have had.  Any medical conditions you have. What are the risks? Generally, this is a safe procedure. However, problems may occur, including:  Infection.  Bleeding.  Worsening symptoms.  Damage to the area around your knee.  Allergic reaction to any of the medicines.  Skin reactions from repeated injections.  What happens before the procedure?  Ask your health care provider about changing or stopping your regular medicines. This is especially important if you are taking diabetes medicines or blood thinners.  Plan to have someone  take you home after the procedure. What happens during the procedure?  You will sit or lie down in a position for your knee to be treated.  The skin over your kneecap will be cleaned with a germ-killing solution (antiseptic).  You will be given a medicine that numbs the area (local anesthetic). You may feel some stinging.  After your knee becomes numb, you will have a second injection. This is the medicine. This needle is carefully placed between your kneecap and your knee. The medicine is injected into the joint space.  At the end of the procedure, the needle will be removed.  A bandage (dressing) may be placed over the injection site. The procedure may vary among health care providers and hospitals. What happens after the procedure?  You may have to move your knee through its full range of motion. This helps to get all  of the medicine into your joint space.  Your blood pressure, heart rate, breathing rate, and blood oxygen level will be monitored often until the medicines you were given have worn off.  You will be watched to make sure that you do not have a reaction to the injected medicine. This information is not intended to replace advice given to you by your health care provider. Make sure you discuss any questions you have with your health care provider. Document Released: 02/09/2007 Document Revised: 04/19/2016 Document Reviewed: 09/28/2014 [employer] Interactive Patient Education  2018 [employer]

## 2018-01-12 NOTE — Progress Notes (Signed)
Nursing Pain Medication Assessment:  Safety precautions to be maintained throughout the outpatient stay will include: orient to surroundings, keep bed in low position, maintain call bell within reach at all times, provide assistance with transfer out of bed and ambulation.  Medication Inspection Compliance: Pill count conducted under aseptic conditions, in front of the patient. Neither the pills nor the bottle was removed from the patient's sight at any time. Once count was completed pills were immediately returned to the patient in their original bottle.  Medication: Tapentadol (Nucynta) Pill/Patch Count: 0 of 90 pills remain Pill/Patch Appearance: Markings consistent with prescribed medication Bottle Appearance: Standard pharmacy container. Clearly labeled. Filled Date: 01 / 10 / 2019 Last Medication intake:  Yesterday

## 2018-01-12 NOTE — Telephone Encounter (Signed)
Called patient back to verify that he was aware of how to prepare for procedure next week if having sedation.  Patient verbalizes u/o information.

## 2018-01-12 NOTE — Progress Notes (Signed)
Patient's Name: Joseph [name]  MRN: [id number]  Referring Provider: Pleas Koch, NP  DOB: 06-24-1971  PCP: Pleas Koch, NP  DOS: 01/12/2018  Note by: Gaspar Cola, MD  Service setting: Ambulatory outpatient  Specialty: Interventional Pain Management  Location: [facility] (AMB) Pain Management Facility    Patient type: Established   Primary Reason(s) for Visit: Encounter for prescription drug management & post-procedure evaluation of chronic illness with mild to moderate exacerbation(Level of risk: moderate) CC: Knee Pain (left )  HPI  Joseph [name] is a 47 y.o. year old, male patient, who comes today for a post-procedure evaluation and medication management. He has Mediastinal abnormality; Cigarette smoker; Chronic pain syndrome; Diabetes ([place]); Intrinsic asthma; Medial meniscus, posterior horn derangement; S/P knee surgery; Acute meniscal tear of left knee; Testosterone deficiency; OSA (obstructive sleep apnea); Morbid obesity ([place]); Anxiety and depression; Bucket handle tear of meniscus of right knee; Skin lesions; GERD (gastroesophageal reflux disease); Osteoarthritis of the knee (Left); Post-void dribbling; COPD without exacerbation ([place]); Hyperlipidemia; Chronic constipation; Disorder of skeletal system; Chronic knee pain (Primary Area of Pain) (Bilateral) (L>R); Chronic wrist pain (Secondary Area of Pain) (Left); Insomnia; Male hypogonadism; Pharmacologic therapy; Problems influencing health status; Long term prescription opiate use; Opiate use; Tinea corporis; Tinea versicolor; Arthropathy of left knee; and Opioid-induced constipation (OIC) on their problem list. His primarily concern today is the Knee Pain (left )  Pain Assessment: Location: Left Knee Radiating: into the top of the shin Onset: More than a month ago Duration: Chronic pain Quality: Throbbing, Discomfort(upon rising he has to stabilize himself to see what the knee is going to do to keep from falling) Severity: 7 /10  (self-reported pain score)  Note: Reported level is inconsistent with clinical observations. Clinically the patient looks like a 2/10 A 2/10 is viewed as "Mild to Moderate" and described as noticeable and distracting. Impossible to hide from other people. More frequent flare-ups. Still possible to adapt and function close to normal. It can be very annoying and may have occasional stronger flare-ups. With discipline, patients may get used to it and adapt. Information on the proper use of the pain scale provided to the patient today. When using our objective Pain Scale, levels between 6 and 10/10 are said to belong in an emergency room, as it progressively worsens from a 6/10, described as severely limiting, requiring emergency care not usually available at an outpatient pain management facility. At a 6/10 level, communication becomes difficult and requires great effort. Assistance to reach the emergency department may be required. Facial flushing and profuse sweating along with potentially dangerous increases in heart rate and blood pressure will be evident. Effect on ADL: affects gait and stability Timing: Constant Modifying factors: nucynta, patient feels is not strong enough  Joseph [name] was last seen on 12/25/2017 for a procedure. During today's appointment we reviewed Joseph [name] post-procedure results, as well as his outpatient medication regimen.  Further details on both, my assessment(s), as well as the proposed treatment plan, please see below.  Controlled Substance Pharmacotherapy Assessment REMS (Risk Evaluation and Mitigation Strategy)  Analgesic: Nucynta 50 mg 3 times daily (150 mg/day of Nucynta) Highest recorded MME/day:18m/day MME/day:680mday PaJanett BillowRN  01/12/2018  9:43 AM  Sign at close encounter Nursing Pain Medication Assessment:  Safety precautions to be maintained throughout the outpatient stay will include: orient to surroundings, keep bed in low position,  maintain call bell within reach at all times, provide assistance with transfer out of bed and ambulation.  Medication Inspection Compliance: Pill count conducted under aseptic conditions, in front of the patient. Neither the pills nor the bottle was removed from the patient's sight at any time. Once count was completed pills were immediately returned to the patient in their original bottle.  Medication: Tapentadol (Nucynta) Pill/Patch Count: 0 of 90 pills remain Pill/Patch Appearance: Markings consistent with prescribed medication Bottle Appearance: Standard pharmacy container. Clearly labeled. Filled Date: 01 / 10 / 2019 Last Medication intake:  Yesterday   Pharmacokinetics: Liberation and absorption (onset of action): WNL Distribution (time to peak effect): WNL Metabolism and excretion (duration of action): WNL         Pharmacodynamics: Desired effects: Analgesia: Joseph [name] reports >40% benefit. Functional ability: Patient reports that medication allows him to accomplish basic ADLs Clinically meaningful improvement in function (CMIF): Sustained CMIF goals met Perceived effectiveness: Described as relatively effective, allowing for increase in activities of daily living (ADL) Undesirable effects: Side-effects or Adverse reactions: None reported Monitoring: [place] PMP: Online review of the past 79-monthperiod conducted. Compliant with practice rules and regulations Last UDS on record: Summary  Date Value Ref Range Status  11/05/2017 FINAL  Final    Comment:    ==================================================================== [facility] COMP DRUG ANALYSIS,UR ==================================================================== Test                             Result       Flag       Units Drug Present and Declared for Prescription Verification   Tapentadol                     11414        EXPECTED   ng/mg creat    Source of tapentadol is a scheduled prescription medication.    Bupropion                      PRESENT      EXPECTED   Hydroxybupropion               PRESENT      EXPECTED    Hydroxybupropion is an expected metabolite of bupropion.   Citalopram                     PRESENT      EXPECTED   Desmethylcitalopram            PRESENT      EXPECTED    Desmethylcitalopram is an expected metabolite of citalopram or    the enantiomeric form, escitalopram.   Trazodone                      PRESENT      EXPECTED   1,3 chlorophenyl piperazine    PRESENT      EXPECTED    1,3-chlorophenyl piperazine is an expected metabolite of    trazodone.   Diphenhydramine                PRESENT      EXPECTED Drug Absent but Declared for Prescription Verification   Alprazolam                     Not Detected UNEXPECTED ng/mg creat ==================================================================== Test                      Result    Flag   Units  Ref Range   Creatinine              66               mg/dL      >=20 ==================================================================== Declared Medications:  The flagging and interpretation on this report are based on the  following declared medications.  Unexpected results may arise from  inaccuracies in the declared medications.  **Note: The testing scope of this panel includes these medications:  Alprazolam (Xanax)  Bupropion (Wellbutrin)  Citalopram  Diphenhydramine (Benadryl)  Tapentadol  Trazodone  **Note: The testing scope of this panel does not include following  reported medications:  Albuterol (Combivent)  Albuterol (Proventil)  Atorvastatin (Lipitor)  Cinnamon Bark  Empagliflozin (Jardiance)  Fluticasone (Breo)  Glipizide  Ipratropium  Ipratropium (Combivent)  Lansoprazole (Prevacid)  Lubiprostone (Amitiza)  Metformin (Glucophage)  Multivitamin  Nabumetone (Relafen)  Sildenafil  Supplement (Saw Palmetto)  Tamsulosin  Testosterone  Triamcinolone  Vilanterol  (Breo) ==================================================================== For clinical consultation, please call [phone]. ====================================================================    UDS interpretation: Compliant          Medication Assessment Form: Reviewed. Patient indicates being compliant with therapy Treatment compliance: Compliant Risk Assessment Profile: Aberrant behavior: See prior evaluations. None observed or detected today Comorbid factors increasing risk of overdose: See prior notes. No additional risks detected today Risk of substance use disorder (SUD): Low Opioid Risk Tool - 01/12/18 0939      Family History of Substance Abuse   Alcohol  Negative    Illegal Drugs  Negative    Rx Drugs  Negative      Personal History of Substance Abuse   Alcohol  Negative    Illegal Drugs  Negative    Rx Drugs  Negative      Psychological Disease   Psychological Disease  Positive    ADD  Negative    OCD  Negative    Bipolar  Negative    Schizophrenia  Negative    Depression  Positive currently taking wellbutrin and celexia   currently taking wellbutrin and celexia     Total Score   Opioid Risk Tool Scoring  3    Opioid Risk Interpretation  Low Risk      ORT Scoring interpretation table:  Score <3 = Low Risk for SUD  Score between 4-7 = Moderate Risk for SUD  Score >8 = High Risk for Opioid Abuse   Risk Mitigation Strategies:  Patient Counseling: Covered Patient-Prescriber Agreement (PPA): Present and active  Notification to other healthcare providers: Done  Pharmacologic Plan: No change in therapy, at this time.             Post-Procedure Assessment  12/25/2017 Procedure: Diagnostic left genicular nerve blocks #1 under fluoroscopic guidance and IV sedation Pre-procedure pain score:  8/10 Post-procedure pain score: 3/10 (> 50% relief) Influential Factors: BMI: 33.00 kg/m Intra-procedural challenges: None observed.         Assessment  challenges: None detected.              Reported side-effects: None.        Post-procedural adverse reactions or complications: None reported         Sedation: Sedation provided. When no sedatives are used, the analgesic levels obtained are directly associated to the effectiveness of the local anesthetics. However, when sedation is provided, the level of analgesia obtained during the initial 1 hour following the intervention, is believed to be the result of a combination of factors. These  factors may include, but are not limited to: 1. The effectiveness of the local anesthetics used. 2. The effects of the analgesic(s) and/or anxiolytic(s) used. 3. The degree of discomfort experienced by the patient at the time of the procedure. 4. The patients ability and reliability in recalling and recording the events. 5. The presence and influence of possible secondary gains and/or psychosocial factors. Reported result: Relief experienced during the 1st hour after the procedure: 100 % (Ultra-Short Term Relief)            Interpretative annotation: Clinically appropriate result. Analgesia during this period is likely to be Local Anesthetic and/or IV Sedative (Analgesic/Anxiolytic) related.          Effects of local anesthetic: The analgesic effects attained during this period are directly associated to the localized infiltration of local anesthetics and therefore cary significant diagnostic value as to the etiological location, or anatomical origin, of the pain. Expected duration of relief is directly dependent on the pharmacodynamics of the local anesthetic used. Long-acting (4-6 hours) anesthetics used.  Reported result: Relief during the next 4 to 6 hour after the procedure: 100 %(patient states that he had relief for approx 3 hours and then the pain was back to the same intensity.) (Short-Term Relief)            Interpretative annotation: Clinically appropriate result. Analgesia during this period is likely to  be Local Anesthetic-related.          Long-term benefit: Defined as the period of time past the expected duration of local anesthetics (1 hour for short-acting and 4-6 hours for long-acting). With the possible exception of prolonged sympathetic blockade from the local anesthetics, benefits during this period are typically attributed to, or associated with, other factors such as analgesic sensory neuropraxia, antiinflammatory effects, or beneficial biochemical changes provided by agents other than the local anesthetics.  Reported result: Extended relief following procedure: 0 % (Long-Term Relief)            Interpretative annotation: Clinically appropriate result. Good relief. No permanent benefit expected. Inflammation plays a part in the etiology to the pain.          Current benefits: Defined as reported results that persistent at this point in time.   Analgesia: 0 %            Function: Somewhat improved ROM: Somewhat improved Interpretative annotation: Recurrence of symptoms. No permanent benefit expected. Effective diagnostic intervention.          Interpretation: Results would suggest a successful diagnostic intervention.                  Plan:  Please see "Plan of Care" for details.        Laboratory Chemistry  Inflammation Markers (CRP: Acute Phase) (ESR: Chronic Phase) Lab Results  Component Value Date   CRP 0.7 11/05/2017   ESRSEDRATE 11 11/05/2017   LATICACIDVEN 2.1 01/24/2013                 Renal Function Markers Lab Results  Component Value Date   BUN 10 11/05/2017   CREATININE 1.03 11/05/2017   GFRAA 100 11/05/2017   GFRNONAA 86 11/05/2017                 Hepatic Function Markers Lab Results  Component Value Date   AST 13 11/05/2017   ALT 19 05/08/2017   ALBUMIN 4.8 11/05/2017   ALKPHOS 100 11/05/2017   LIPASE 38 01/24/2013  Electrolytes Lab Results  Component Value Date   NA 138 11/05/2017   K 4.6 11/05/2017   CL 97 11/05/2017    CALCIUM 9.6 11/05/2017   MG 2.1 11/05/2017                 Neuropathy Markers Lab Results  Component Value Date   VITAMINB12 341 11/05/2017   HGBA1C 8.3 (H) 11/17/2017                 Bone Pathology Markers Lab Results  Component Value Date   25OHVITD1 42 11/05/2017   25OHVITD2 <1.0 11/05/2017   25OHVITD3 42 11/05/2017   TESTOSTERONE 208.98 (L) 04/17/2017                 Coagulation Parameters Lab Results  Component Value Date   PLT 214 03/17/2017   DDIMER 0.46 03/07/2012                 Cardiovascular Markers Lab Results  Component Value Date   TROPONINI <0.30 12/09/2013   HGB 14.0 03/17/2017   HCT 41.1 03/17/2017                 Note: Lab results reviewed.  Recent Diagnostic Imaging Results  DG C-Arm 1-60 Min-No Report Fluoroscopy was utilized by the requesting physician.  No radiographic  interpretation.   Complexity Note: Imaging results reviewed. Results shared with Joseph [name], using Layman's terms.                         Meds   Current Outpatient Medications:  .  albuterol (PROVENTIL) (2.5 MG/3ML) 0.083% nebulizer solution, USE 1 VIAL VIA NEBULIZER FOUR TIMES DAILY AS NEEDED FOR SHORTNESS OF BREATH, Disp: 360 mL, Rfl: 0 .  atorvastatin (LIPITOR) 20 MG tablet, Take 1 tablet (20 mg total) by mouth every evening., Disp: 90 tablet, Rfl: 3 .  buPROPion (WELLBUTRIN SR) 150 MG 12 hr tablet, Take 1 tablet (150 mg total) by mouth 2 (two) times daily., Disp: 180 tablet, Rfl: 1 .  celecoxib (CELEBREX) 100 MG capsule, Take 100 mg by mouth 2 (two) times daily., Disp: , Rfl:  .  ciclopirox (LOPROX) 0.77 % cream, Apply topically 2 (two) times daily., Disp: 30 g, Rfl: 0 .  CINNAMON PO, Take 2,800 mg by mouth daily. EACH TABLET IS 1400 MG.  TAKES 2 CAPSULES DAILY, Disp: , Rfl:  .  citalopram (CELEXA) 40 MG tablet, TAKE ONE (1) TABLET BY MOUTH EVERY DAY, Disp: 30 tablet, Rfl: 11 .  Continuous Blood Gluc Receiver (FREESTYLE LIBRE 14 DAY READER) DEVI, 1 Device by Does not  apply route 3 (three) times daily., Disp: 1 Device, Rfl: 0 .  Continuous Blood Gluc Sensor (FREESTYLE LIBRE 14 DAY SENSOR) MISC, 1 Device by Does not apply route 3 (three) times daily., Disp: 1 each, Rfl: 0 .  diphenhydrAMINE (BENADRYL) 25 MG tablet, Take 25 mg by mouth 2 (two) times daily., Disp: , Rfl:  .  empagliflozin (JARDIANCE) 10 MG TABS tablet, Take 10 mg by mouth every morning., Disp: 90 tablet, Rfl: 2 .  fluconazole (DIFLUCAN) 150 MG tablet, Take 2 tablets now, repeat in one week., Disp: 4 tablet, Rfl: 0 .  fluticasone furoate-vilanterol (BREO ELLIPTA) 200-25 MCG/INH AEPB, Inhale 1 puff into the lungs daily., Disp: 30 each, Rfl: 11 .  glipiZIDE (GLUCOTROL) 5 MG tablet, TAKE TWO TABLETS BY MOUTH TWICE DAILY, Disp: 360 tablet, Rfl: 1 .  ipratropium (ATROVENT) 0.02 % nebulizer solution,  USE 1 VIAL IN NEBULIZER FOUR TIMES DAILY AS NEEDED, Disp: 300 mL, Rfl: 0 .  Ipratropium-Albuterol (COMBIVENT) 20-100 MCG/ACT AERS respimat, Inhale 1 puff into the lungs every 6 (six) hours as needed for wheezing., Disp: 1 Inhaler, Rfl: 2 .  lansoprazole (PREVACID) 30 MG capsule, TAKE ONE CAPSULE BY MOUTH TWICE A DAY BEFORE A MEAL, Disp: 180 capsule, Rfl: 1 .  lubiprostone (AMITIZA) 24 MCG capsule, Take 1 capsule (24 mcg total) by mouth 2 (two) times daily., Disp: 60 capsule, Rfl: 2 .  metFORMIN (GLUCOPHAGE-XR) 500 MG 24 hr tablet, TAKE 1 TABLET DAILY WITH BREAKFAST FOR 2WEEKS, THEN ADVANCE TO 2 TABLETS WITH BREAKFAST THEREAFTER, Disp: 180 tablet, Rfl: 1 .  Multiple Vitamin (MULTIVITAMIN WITH MINERALS) TABS, Take 1 tablet by mouth every morning. Men's once daily multivitamin packet (vitamin e, calcium, ginseng), Disp: , Rfl:  .  Saw Palmetto, Serenoa repens, (SAW PALMETTO PO), Take 1 capsule by mouth daily., Disp: , Rfl:  .  tamsulosin (FLOMAX) 0.4 MG CAPS capsule, TAKE ONE (1) CAPSULE EACH DAY, Disp: 90 capsule, Rfl: 1 .  tapentadol (NUCYNTA) 50 MG tablet, Take 50 mg by mouth 3 (three) times daily., Disp: , Rfl:   .  tapentadol (NUCYNTA) 50 MG tablet, Take 1 tablet (50 mg total) by mouth every 8 (eight) hours as needed for severe pain. Do not take if you have epilepsy or a history of seizures. Swallow tablets whole. Do not chew, crush or dissolve., Disp: 90 tablet, Rfl: 0 .  testosterone (ANDROGEL) 50 MG/5GM (1%) GEL, Place 5 g onto the skin daily., Disp: , Rfl:  .  traZODone (DESYREL) 150 MG tablet, Take 1 tablet (150 mg total) by mouth at bedtime., Disp: 90 tablet, Rfl: 1 .  triamcinolone cream (KENALOG) 0.1 %, Apply 1 application topically 2 (two) times daily., Disp: 45 g, Rfl: 0  ROS  Constitutional: Denies any fever or chills Gastrointestinal: No reported hemesis, hematochezia, vomiting, or acute GI distress Musculoskeletal: Denies any acute onset joint swelling, redness, loss of ROM, or weakness Neurological: No reported episodes of acute onset apraxia, aphasia, dysarthria, agnosia, amnesia, paralysis, loss of coordination, or loss of consciousness  Allergies  Joseph [name] is allergic to azithromycin; erythromycin; keflex [cephalexin]; metformin; and symbicort [budesonide-formoterol fumarate].  [place]  Drug: Joseph [name]  reports that he does not use drugs. Alcohol:  reports that he does not drink alcohol. Tobacco:  reports that he has been smoking cigarettes.  He started smoking about 24 years ago. He has a 30.00 pack-year smoking history. He has quit using smokeless tobacco. Medical:  has a past medical history of Anxiety, Arthritis, Asthma, Chronic back pain, Chronic pain of left knee, COPD (chronic obstructive pulmonary disease) ([place]), Diabetes mellitus, GERD (gastroesophageal reflux disease), INGUINAL PAIN, LEFT (10/03/2009), Shortness of breath, and Sleep apnea. Surgical: Joseph [name]  has a past surgical history that includes Carpal tunnel release (Left); Shoulder Arthrocentesis (Right); Vasectomy; Hernia repair (Bilateral); Knee arthroscopy with medial menisectomy (Left, 04/22/2014); Knee arthroscopy  with medial menisectomy (Right, 12/06/2015); Cataract extraction w/PHACO (Right, 03/11/2016); ORIF wrist fracture (Left, 12/19/2016); and Knee arthroscopy with medial menisectomy (Left, 03/20/2017). Family: family history includes Clotting disorder in his maternal grandmother; Emphysema in his maternal grandfather and maternal grandmother.  Constitutional Exam  General appearance: Well nourished, well developed, and well hydrated. In no apparent acute distress Vitals:   01/12/18 0932  BP: 132/80  Pulse: 88  Resp: 18  Temp: 98.7 F (37.1 C)  TempSrc: Oral  SpO2: 98%  Weight: 230  lb (104.3 kg)  Height: '5\' 10"'  (1.778 m)   BMI Assessment: Estimated body mass index is 33 kg/m as calculated from the following:   Height as of this encounter: '5\' 10"'  (1.778 m).   Weight as of this encounter: 230 lb (104.3 kg).  BMI interpretation table: BMI level Category Range association with higher incidence of chronic pain  <18 kg/m2 Underweight   18.5-24.9 kg/m2 Ideal body weight   25-29.9 kg/m2 Overweight Increased incidence by 20%  30-34.9 kg/m2 Obese (Class I) Increased incidence by 68%  35-39.9 kg/m2 Severe obesity (Class II) Increased incidence by 136%  >40 kg/m2 Extreme obesity (Class III) Increased incidence by 254%   BMI Readings from Last 4 Encounters:  01/12/18 33.00 kg/m  01/07/18 33.79 kg/m  12/25/17 34.44 kg/m  12/10/17 35.01 kg/m   Wt Readings from Last 4 Encounters:  01/12/18 230 lb (104.3 kg)  01/07/18 235 lb 8 oz (106.8 kg)  12/25/17 240 lb (108.9 kg)  12/10/17 244 lb (110.7 kg)  Psych/Mental status: Alert, oriented x 3 (person, place, & time)       Eyes: PERLA Respiratory: No evidence of acute respiratory distress  Cervical Spine Area Exam  Skin & Axial Inspection: No masses, redness, edema, swelling, or associated skin lesions Alignment: Symmetrical Functional ROM: Unrestricted ROM      Stability: No instability detected Muscle Tone/Strength: Functionally intact. No  obvious neuro-muscular anomalies detected. Sensory (Neurological): Unimpaired Palpation: No palpable anomalies              Upper Extremity (UE) Exam    Side: Right upper extremity  Side: Left upper extremity  Skin & Extremity Inspection: Skin color, temperature, and hair growth are WNL. No peripheral edema or cyanosis. No masses, redness, swelling, asymmetry, or associated skin lesions. No contractures.  Skin & Extremity Inspection: Skin color, temperature, and hair growth are WNL. No peripheral edema or cyanosis. No masses, redness, swelling, asymmetry, or associated skin lesions. No contractures.  Functional ROM: Unrestricted ROM          Functional ROM: Unrestricted ROM          Muscle Tone/Strength: Functionally intact. No obvious neuro-muscular anomalies detected.  Muscle Tone/Strength: Functionally intact. No obvious neuro-muscular anomalies detected.  Sensory (Neurological): Unimpaired          Sensory (Neurological): Unimpaired          Palpation: No palpable anomalies              Palpation: No palpable anomalies              Specialized Test(s): Deferred         Specialized Test(s): Deferred          Thoracic Spine Area Exam  Skin & Axial Inspection: No masses, redness, or swelling Alignment: Symmetrical Functional ROM: Unrestricted ROM Stability: No instability detected Muscle Tone/Strength: Functionally intact. No obvious neuro-muscular anomalies detected. Sensory (Neurological): Unimpaired Muscle strength & Tone: No palpable anomalies  Lumbar Spine Area Exam  Skin & Axial Inspection: No masses, redness, or swelling Alignment: Symmetrical Functional ROM: Unrestricted ROM      Stability: No instability detected Muscle Tone/Strength: Functionally intact. No obvious neuro-muscular anomalies detected. Sensory (Neurological): Unimpaired Palpation: No palpable anomalies       Provocative Tests: Lumbar Hyperextension and rotation test: evaluation deferred today       Lumbar  Lateral bending test: evaluation deferred today       Patrick's Maneuver: evaluation deferred today  Gait & Posture Assessment  Ambulation: Unassisted Gait: Antalgic Posture: WNL   Lower Extremity Exam    Side: Right lower extremity  Side: Left lower extremity  Skin & Extremity Inspection: Skin color, temperature, and hair growth are WNL. No peripheral edema or cyanosis. No masses, redness, swelling, asymmetry, or associated skin lesions. No contractures.  Skin & Extremity Inspection: Skin color, temperature, and hair growth are WNL. No peripheral edema or cyanosis. No masses, redness, swelling, asymmetry, or associated skin lesions. No contractures.  Functional ROM: Unrestricted ROM          Functional ROM: Decreased ROM for knee joint  Muscle Tone/Strength: Functionally intact. No obvious neuro-muscular anomalies detected.  Muscle Tone/Strength: Functionally intact. No obvious neuro-muscular anomalies detected.  Sensory (Neurological): Unimpaired  Sensory (Neurological): Articular pain pattern  Palpation: No palpable anomalies  Palpation: No palpable anomalies   Assessment  Primary Diagnosis & Pertinent Problem List: The primary encounter diagnosis was Chronic knee pain (Primary Area of Pain) (Bilateral) (L>R). Diagnoses of Chronic wrist pain (Secondary Area of Pain) (Left), Arthropathy of left knee, Bucket handle tear of meniscus of right knee, unspecified meniscus, unspecified whether old or current tear, sequela, Long term prescription opiate use, Opiate use, Pharmacologic therapy, Chronic pain syndrome, Drug-induced constipation, and Opioid-induced constipation (OIC) were also pertinent to this visit.  Status Diagnosis  Persistent Persistent Persistent 1. Chronic knee pain (Primary Area of Pain) (Bilateral) (L>R)   2. Chronic wrist pain (Secondary Area of Pain) (Left)   3. Arthropathy of left knee   4. Bucket handle tear of meniscus of right knee, unspecified  meniscus, unspecified whether old or current tear, sequela   5. Long term prescription opiate use   6. Opiate use   7. Pharmacologic therapy   8. Chronic pain syndrome   9. Drug-induced constipation   10. Opioid-induced constipation (OIC)     Problems updated and reviewed during this visit: No problems updated. Plan of Care  Pharmacotherapy (Medications Ordered): Meds ordered this encounter  Medications  . tapentadol (NUCYNTA) 50 MG tablet    Sig: Take 1 tablet (50 mg total) by mouth every 8 (eight) hours as needed for severe pain. Do not take if you have epilepsy or a history of seizures. Swallow tablets whole. Do not chew, crush or dissolve.    Dispense:  90 tablet    Refill:  0    Do not place this medication, or any other prescription from our practice, on "Automatic Refill". Patient may have prescription filled one day early if pharmacy is closed on scheduled refill date. Do not fill until: 01/12/18 To last until: 02/11/18  . lubiprostone (AMITIZA) 24 MCG capsule    Sig: Take 1 capsule (24 mcg total) by mouth 2 (two) times daily.    Dispense:  60 capsule    Refill:  2    Do not place medication on "Automatic Refill". Fill one day early if pharmacy is closed on scheduled refill date.   Medications administered today: Arn Medal "Richard" had no medications administered during this visit.   Procedure Orders     GENICULAR NERVE BLOCK Lab Orders  No laboratory test(s) ordered today   Imaging Orders  No imaging studies ordered today   Referral Orders  No referral(s) requested today    Interventional management options: Planned, scheduled, and/or pending:   Diagnostic left genicular nerve block #2 under fluoroscopic guidance and IV sedation   Considering:   Diagnostic left genicular nerve block  Possible Left genicular RFA  Palliative PRN treatment(s):   None at this time   Provider-requested follow-up: Return for Procedure (w/ sedation): (L) Genicular  NB.  Future Appointments  Date Time Provider [place]  01/20/2018  8:00 AM Milinda Pointer, MD [facility] None  02/09/2018  9:00 AM Carole Civil, MD [facility]  02/16/2018  2:30 PM Pleas Koch, NP [facility]  05/20/2018 10:30 AM Eustace Pen, LPN [facility]  03/02/2507  9:00 AM Pleas Koch, NP [facility]   Primary Care Physician: Pleas Koch, NP Location: [facility] Note by: Gaspar Cola, MD Date: 01/12/2018; Time: 10:21 AM

## 2018-01-19 NOTE — Progress Notes (Signed)
Patient's Name: Joseph [name]  MRN: [id number]  Referring Provider: Doreene Nestlark, Katherine K, NP  DOB: 31-May-1971  PCP: Doreene Nestlark, Katherine K, NP  DOS: 01/20/2018  Note by: Oswaldo DoneFrancisco A Nicklous Aburto, MD  Service setting: Ambulatory outpatient  Specialty: Interventional Pain Management  Patient type: Established  Location: [facility] ([facility]) [facility] Facility  Visit type: Interventional Procedure   Primary Reason for Visit: Interventional Pain Management Treatment. CC: Knee Pain (left)  Procedure:  Anesthesia, Analgesia, Anxiolysis:  Type: Genicular Nerves Block (Superior-lateral, Superior-medial, and Inferior-medial Genicular Nerves) #2  CPT: 64450      Primary Purpose: Diagnostic Region: Lateral, Anterior, and Medial aspects of the knee joint, above and below the knee joint proper. Level: Superior and inferior to the knee joint. Target Area: For Genicular Nerve block(s), the targets are: the superior-lateral genicular nerve, located in the lateral distal portion of the femoral shaft as it curves to form the lateral epicondyle, in the region of the distal femoral metaphysis; the superior-medial genicular nerve, located in the medial distal portion of the femoral shaft as it curves to form the medial epicondyle; and the inferior-medial genicular nerve, located in the medial, proximal portion of the tibial shaft, as it curves to form the medial epicondyle, in the region of the proximal tibial metaphysis. Approach: Anterior, percutaneous, ipsilateral approach. Laterality: Left knee Position: Modified Fowler's position with pillows under the targeted knee(s).  Type: Local Anesthesia with Moderate (Conscious) Sedation Local Anesthetic: Lidocaine 1% Route: Intravenous (IV) IV Access: Secured Sedation: Meaningful verbal contact was maintained at all times during the procedure  Indication(s): Analgesia and Anxiety   Indications: 1. Osteoarthritis of the knee (Left)   2. Chronic knee pain (Primary Area of Pain)  (Bilateral) (L>R)   3. Arthropathy of left knee    Pain Score: Pre-procedure: 6 /10 Post-procedure: 0-No pain/10  Pre-op Assessment:  Joseph [name] is a 47 y.o. (year old), male patient, seen today for interventional treatment. He  has a past surgical history that includes Carpal tunnel release (Left); Shoulder Arthrocentesis (Right); Vasectomy; Hernia repair (Bilateral); Knee arthroscopy with medial menisectomy (Left, 04/22/2014); Knee arthroscopy with medial menisectomy (Right, 12/06/2015); Cataract extraction w/PHACO (Right, 03/11/2016); ORIF wrist fracture (Left, 12/19/2016); and Knee arthroscopy with medial menisectomy (Left, 03/20/2017). Joseph [name] has a current medication list which includes the following prescription(s): albuterol, atorvastatin, bupropion, celecoxib, ciclopirox, cinnamon, citalopram, freestyle libre 14 day reader, freestyle libre 14 day sensor, diphenhydramine, empagliflozin, fluticasone furoate-vilanterol, glipizide, ipratropium, ipratropium-albuterol, lansoprazole, lubiprostone, metformin, multivitamin with minerals, saw palmetto (serenoa repens), tamsulosin, tapentadol, tapentadol, testosterone, trazodone, triamcinolone cream, and fluconazole, and the following Facility-Administered Medications: fentanyl and midazolam. His primarily concern today is the Knee Pain (left)  Initial Vital Signs:  Pulse Rate: 86 Temp: 98.2 F (36.8 C) Resp: 18 BP: 118/78 SpO2: 98 %  BMI: Estimated body mass index is 33.72 kg/m as calculated from the following:   Height as of this encounter: 5\' 10"  (1.778 m).   Weight as of this encounter: 235 lb (106.6 kg).  Risk Assessment: Allergies: Reviewed. He is allergic to azithromycin; erythromycin; keflex [cephalexin]; metformin; and symbicort [budesonide-formoterol fumarate].  Allergy Precautions: None required Coagulopathies: Reviewed. None identified.  Blood-thinner therapy: None at this time Active Infection(s): Reviewed. None identified. Mr.  Preston [name] is afebrile  Site Confirmation: Joseph [name] was asked to confirm the procedure and laterality before marking the site Procedure checklist: Completed Consent: Before the procedure and under the influence of no sedative(s), amnesic(s), or anxiolytics, the patient was informed of the treatment options, risks and  possible complications. To fulfill our ethical and legal obligations, as recommended by the American Medical Association's Code of Ethics, I have informed the patient of my clinical impression; the nature and purpose of the treatment or procedure; the risks, benefits, and possible complications of the intervention; the alternatives, including doing nothing; the risk(s) and benefit(s) of the alternative treatment(s) or procedure(s); and the risk(s) and benefit(s) of doing nothing. The patient was provided information about the general risks and possible complications associated with the procedure. These may include, but are not limited to: failure to achieve desired goals, infection, bleeding, organ or nerve damage, allergic reactions, paralysis, and death. In addition, the patient was informed of those risks and complications associated to the procedure, such as failure to decrease pain; infection; bleeding; organ or nerve damage with subsequent damage to sensory, motor, and/or autonomic systems, resulting in permanent pain, numbness, and/or weakness of one or several areas of the body; allergic reactions; (i.e.: anaphylactic reaction); and/or death. Furthermore, the patient was informed of those risks and complications associated with the medications. These include, but are not limited to: allergic reactions (i.e.: anaphylactic or anaphylactoid reaction(s)); adrenal axis suppression; blood sugar elevation that in diabetics may result in ketoacidosis or comma; water retention that in patients with history of congestive heart failure may result in shortness of breath, pulmonary edema, and  decompensation with resultant heart failure; weight gain; swelling or edema; medication-induced neural toxicity; particulate matter embolism and blood vessel occlusion with resultant organ, and/or nervous system infarction; and/or aseptic necrosis of one or more joints. Finally, the patient was informed that Medicine is not an exact science; therefore, there is also the possibility of unforeseen or unpredictable risks and/or possible complications that may result in a catastrophic outcome. The patient indicated having understood very clearly. We have given the patient no guarantees and we have made no promises. Enough time was given to the patient to ask questions, all of which were answered to the patient's satisfaction. Mr. Berwick has indicated that he wanted to continue with the procedure. Attestation: I, the ordering provider, attest that I have discussed with the patient the benefits, risks, side-effects, alternatives, likelihood of achieving goals, and potential problems during recovery for the procedure that I have provided informed consent. Date  Time: 01/20/2018  8:08 AM  Pre-Procedure Preparation:  Monitoring: As per clinic protocol. Respiration, ETCO2, SpO2, BP, heart rate and rhythm monitor placed and checked for adequate function Safety Precautions: Patient was assessed for positional comfort and pressure points before starting the procedure. Time-out: I initiated and conducted the "Time-out" before starting the procedure, as per protocol. The patient was asked to participate by confirming the accuracy of the "Time Out" information. Verification of the correct person, site, and procedure were performed and confirmed by me, the nursing staff, and the patient. "Time-out" conducted as per Joint Commission's Universal Protocol (UP.01.01.01). Time: 0850  Description of Procedure Process:  Area Prepped: Entire knee area, from mid-thigh to mid-shin, lateral, anterior, and medial aspects. Prepping  solution: ChloraPrep (2% chlorhexidine gluconate and 70% isopropyl alcohol) Safety Precautions: Aspiration looking for blood return was conducted prior to all injections. At no point did we inject any substances, as a needle was being advanced. No attempts were made at seeking any paresthesias. Safe injection practices and needle disposal techniques used. Medications properly checked for expiration dates. SDV (single dose vial) medications used. Latex Allergy precautions taken.   Description of the Procedure: Protocol guidelines were followed. The patient was placed in position over the  procedure table. The target area was identified and the area prepped in the usual manner. Skin desensitized using vapocoolant spray. Skin & deeper tissues infiltrated with local anesthetic. Appropriate amount of time allowed to pass for local anesthetics to take effect. The procedure needles were then advanced to the target area. Proper needle placement secured. Negative aspiration confirmed. Solution injected in intermittent fashion, asking for systemic symptoms every 0.5cc of injectate. The needles were then removed and the area cleansed, making sure to leave some of the prepping solution back to take advantage of its long term bactericidal properties.  Vitals:   01/20/18 0902 01/20/18 0908 01/20/18 0918 01/20/18 0928  BP: 107/70 112/73 105/79 116/76  Pulse:      Resp: 19 (!) 9 (!) 9 19  Temp:  97.6 F (36.4 C)    TempSrc:  Temporal    SpO2: 94% 92% 92% 93%  Weight:      Height:        Start Time: 0850 hrs. End Time: 0901 hrs. Materials:  Needle(s) Type: Regular needle Gauge: 22G Length: 3.5-in Medication(s): Please see orders for medications and dosing details.  Imaging Guidance (Non-Spinal):  Type of Imaging Technique: Fluoroscopy Guidance (Non-Spinal) Indication(s): Assistance in needle guidance and placement for procedures requiring needle placement in or near specific anatomical locations not easily  accessible without such assistance. Exposure Time: Please see nurses notes. Contrast: Before injecting any contrast, we confirmed that the patient did not have an allergy to iodine, shellfish, or radiological contrast. Once satisfactory needle placement was completed at the desired level, radiological contrast was injected. Contrast injected under live fluoroscopy. No contrast complications. See chart for type and volume of contrast used. Fluoroscopic Guidance: I was personally present during the use of fluoroscopy. "Tunnel Vision Technique" used to obtain the best possible view of the target area. Parallax error corrected before commencing the procedure. "Direction-depth-direction" technique used to introduce the needle under continuous pulsed fluoroscopy. Once target was reached, antero-posterior, oblique, and lateral fluoroscopic projection used confirm needle placement in all planes. Images permanently stored in EMR. Interpretation: I personally interpreted the imaging intraoperatively. Adequate needle placement confirmed in multiple planes. Appropriate spread of contrast into desired area was observed. No evidence of afferent or efferent intravascular uptake. Permanent images saved into the patient's record.  Antibiotic Prophylaxis:   Anti-infectives (From admission, onward)   None     Indication(s): None identified  Post-operative Assessment:  Post-procedure Vital Signs:  Pulse Rate: 86 Temp: 97.6 F (36.4 C) Resp: 19 BP: 116/76 SpO2: 93 %  EBL: None  Complications: No immediate post-treatment complications observed by team, or reported by patient.  Note: The patient tolerated the entire procedure well. A repeat set of vitals were taken after the procedure and the patient was kept under observation following institutional policy, for this type of procedure. Post-procedural neurological assessment was performed, showing return to baseline, prior to discharge. The patient was provided  with post-procedure discharge instructions, including a section on how to identify potential problems. Should any problems arise concerning this procedure, the patient was given instructions to immediately contact us, at any time, without hesitation. In any case, we plan to contact the patient by telephone for a follow-up status report regarding this interventional procedure.  Comments:  No additional relevant information.  Plan of Care    Imaging Orders     DG C-Arm 1-60 Min-No Report  Procedure Orders     GENICULAR NERVE BLOCK  Medications ordered for procedure: Meds ordered this encounter  Medications  . midazolam (VERSED) 5 MG/5ML injection 1-2 mg    Make sure Flumazenil is available in the pyxis when using this medication. If oversedation occurs, administer 0.2 mg IV over 15 sec. If after 45 sec no response, administer 0.2 mg again over 1 min; may repeat at 1 min intervals; not to exceed 4 doses (1 mg)  . fentaNYL (SUBLIMAZE) injection 25-50 mcg    Make sure Narcan is available in the pyxis when using this medication. In the event of respiratory depression (RR< 8/min): Titrate NARCAN (naloxone) in increments of 0.1 to 0.2 mg IV at 2-3 minute intervals, until desired degree of reversal.  . lactated ringers infusion 1,000 mL  . lidocaine (XYLOCAINE) 2 % (with pres) injection 400 mg  . ropivacaine (PF) 2 mg/mL (0.2%) (NAROPIN) injection 9 mL  . methylPREDNISolone acetate (DEPO-MEDROL) injection 80 mg   Medications administered: We administered midazolam, fentaNYL, lactated ringers, lidocaine, ropivacaine (PF) 2 mg/mL (0.2%), and methylPREDNISolone acetate.  See the medical record for exact dosing, route, and time of administration.  New Prescriptions   No medications on file   Disposition: Discharge home  Discharge Date & Time: 01/20/2018; 0930 hrs.   Physician-requested Follow-up: Return for post-procedure eval (2 wks), w/ Dr. Laban Emperor.  Future Appointments  Date Time Provider  Department Center  02/02/2018  1:15 PM Delano Metz, MD [facility] None  02/09/2018  9:00 AM Vickki Hearing, MD [facility]  02/16/2018  2:30 PM Doreene Nest, NP [facility]  05/20/2018 10:30 AM Robert Bellow, LPN [facility]  05/26/2018  9:00 AM Doreene Nest, NP [facility]   Primary Care Physician: Doreene Nest, NP Location: [facility] Note by: Oswaldo Done, MD Date: 01/20/2018; Time: 10:22 AM  Disclaimer:  Medicine is not an Visual merchandiser. The only guarantee in medicine is that nothing is guaranteed. It is important to note that the decision to proceed with this intervention was based on the information collected from the patient. The Data and conclusions were drawn from the patient's questionnaire, the interview, and the physical examination. Because the information was provided in large part by the patient, it cannot be guaranteed that it has not been purposely or unconsciously manipulated. Every effort has been made to obtain as much relevant data as possible for this evaluation. It is important to note that the conclusions that lead to this procedure are derived in large part from the available data. Always take into account that the treatment will also be dependent on availability of resources and existing treatment guidelines, considered by other Pain Management Practitioners as being common knowledge and practice, at the time of the intervention. For Medico-Legal purposes, it is also important to point out that variation in procedural techniques and pharmacological choices are the acceptable norm. The indications, contraindications, technique, and results of the above procedure should only be interpreted and judged by a Board-Certified Interventional Pain Specialist with extensive familiarity and expertise in the same exact procedure and technique.

## 2018-01-20 ENCOUNTER — Encounter: Payer: Self-pay | Admitting: Pain Medicine

## 2018-01-20 ENCOUNTER — Ambulatory Visit
Admission: RE | Admit: 2018-01-20 | Discharge: 2018-01-20 | Disposition: A | Discharge status: Home / Self Care | Payer: Medicare Other | Source: Ambulatory Visit | Attending: Pain Medicine | Admitting: Pain Medicine

## 2018-01-20 ENCOUNTER — Ambulatory Visit (HOSPITAL_BASED_OUTPATIENT_CLINIC_OR_DEPARTMENT_OTHER): Payer: Medicare Other | Admitting: Pain Medicine

## 2018-01-20 ENCOUNTER — Other Ambulatory Visit: Payer: Self-pay

## 2018-01-20 VITALS — BP 116/76 | HR 86 | Temp 97.6°F | Resp 19 | Ht 70.0 in | Wt 235.0 lb

## 2018-01-20 DIAGNOSIS — G8929 Other chronic pain: Secondary | ICD-10-CM | POA: Diagnosis not present

## 2018-01-20 DIAGNOSIS — M1712 Unilateral primary osteoarthritis, left knee: Secondary | ICD-10-CM | POA: Diagnosis not present

## 2018-01-20 DIAGNOSIS — Z888 Allergy status to other drugs, medicaments and biological substances status: Secondary | ICD-10-CM | POA: Diagnosis not present

## 2018-01-20 DIAGNOSIS — M25561 Pain in right knee: Secondary | ICD-10-CM

## 2018-01-20 DIAGNOSIS — M25562 Pain in left knee: Secondary | ICD-10-CM | POA: Insufficient documentation

## 2018-01-20 DIAGNOSIS — Z9841 Cataract extraction status, right eye: Secondary | ICD-10-CM | POA: Insufficient documentation

## 2018-01-20 DIAGNOSIS — Z9889 Other specified postprocedural states: Secondary | ICD-10-CM | POA: Diagnosis not present

## 2018-01-20 DIAGNOSIS — Z881 Allergy status to other antibiotic agents status: Secondary | ICD-10-CM | POA: Insufficient documentation

## 2018-01-20 MED ORDER — FENTANYL CITRATE (PF) 100 MCG/2ML IJ SOLN
25.0000 ug | INTRAMUSCULAR | Status: DC | PRN
  Administered 2018-01-20: 50 ug via INTRAVENOUS
  Filled 2018-01-20: qty 2

## 2018-01-20 MED ORDER — ROPIVACAINE HCL 2 MG/ML IJ SOLN
9.0000 mL | Freq: Once | INTRAMUSCULAR | Status: AC
  Administered 2018-01-20: 9 mL
  Filled 2018-01-20: qty 10

## 2018-01-20 MED ORDER — METHYLPREDNISOLONE ACETATE 80 MG/ML IJ SUSP
80.0000 mg | Freq: Once | INTRAMUSCULAR | Status: AC
  Administered 2018-01-20: 80 mg
  Filled 2018-01-20: qty 1

## 2018-01-20 MED ORDER — LIDOCAINE HCL 2 % IJ SOLN
20.0000 mL | Freq: Once | INTRAMUSCULAR | Status: AC
  Administered 2018-01-20: 400 mg
  Filled 2018-01-20: qty 100

## 2018-01-20 MED ORDER — MIDAZOLAM HCL 5 MG/5ML IJ SOLN
1.0000 mg | INTRAMUSCULAR | Status: DC | PRN
  Administered 2018-01-20: 3 mg via INTRAVENOUS
  Filled 2018-01-20: qty 5

## 2018-01-20 MED ORDER — LACTATED RINGERS IV SOLN
1000.0000 mL | Freq: Once | INTRAVENOUS | Status: AC
  Administered 2018-01-20: 1000 mL via INTRAVENOUS

## 2018-01-20 NOTE — Progress Notes (Signed)
Safety precautions to be maintained throughout the outpatient stay will include: orient to surroundings, keep bed in low position, maintain call bell within reach at all times, provide assistance with transfer out of bed and ambulation.  

## 2018-01-20 NOTE — Patient Instructions (Addendum)
____________________________________________________________________________________________  Post-Procedure instructions Instructions:  Apply ice: Fill a plastic sandwich bag with crushed ice. Cover it with a small towel and apply to injection site. Apply for 15 minutes then remove x 15 minutes. Repeat sequence on day of procedure, until you go to bed. The purpose is to minimize swelling and discomfort after procedure.  Apply heat: Apply heat to procedure site starting the day following the procedure. The purpose is to treat any soreness and discomfort from the procedure.  Food intake: Start with clear liquids (like water) and advance to regular food, as tolerated.   Physical activities: Keep activities to a minimum for the first 8 hours after the procedure.   Driving: If you have received any sedation, you are not allowed to drive for 24 hours after your procedure.  Blood thinner: Restart your blood thinner 6 hours after your procedure. (Only for those taking blood thinners)  Insulin: As soon as you can eat, you may resume your normal dosing schedule. (Only for those taking insulin)  Infection prevention: Keep procedure site clean and dry.  Post-procedure Pain Diary: Extremely important that this be done correctly and accurately. Recorded information will be used to determine the next step in treatment.  Pain evaluated is that of treated area only. Do not include pain from an untreated area.  Complete every hour, on the hour, for the initial 8 hours. Set an alarm to help you do this part accurately.  Do not go to sleep and have it completed later. It will not be accurate.  Follow-up appointment: Keep your follow-up appointment after the procedure. Usually 2 weeks for most procedures. (6 weeks in the case of radiofrequency.) Bring you pain diary.  Expect:  From numbing medicine (AKA: Local Anesthetics): Numbness or decrease in pain.  Onset: Full effect within 15 minutes of  injected.  Duration: It will depend on the type of local anesthetic used. On the average, 1 to 8 hours.   From steroids: Decrease in swelling or inflammation. Once inflammation is improved, relief of the pain will follow.  Onset of benefits: Depends on the amount of swelling present. The more swelling, the longer it will take for the benefits to be seen. In some cases, up to 10 days.  Duration: Steroids will stay in the system x 2 weeks. Duration of benefits will depend on multiple posibilities including persistent irritating factors.  From procedure: Some discomfort is to be expected once the numbing medicine wears off. This should be minimal if ice and heat are applied as instructed. Call if:  You experience numbness and weakness that gets worse with time, as opposed to wearing off.  New onset bowel or bladder incontinence. (Spinal procedures only)  Emergency Numbers:  Durning business hours (Monday - Thursday, 8:00 AM - 4:00 PM) (Friday, 9:00 AM - 12:00 Noon): [phone]  After hours: [phone] ____________________________________________________________________________________________   ____________________________________________________________________________________________  Pain Scale  Introduction: The pain score used by this practice is the Verbal Numerical Rating Scale (VNRS-11). This is an 11-point scale. It is for adults and children 10 years or older. There are significant differences in how the pain score is reported, used, and applied. Forget everything you learned in the past and learn this scoring system.  General Information: The scale should reflect your current level of pain. Unless you are specifically asked for the level of your worst pain, or your average pain. If you are asked for one of these two, then it should be understood that it is   over the past 24 hours.  Basic Activities of Daily Living (ADL): Personal hygiene, dressing, eating,  transferring, and using restroom.  Instructions: Most patients tend to report their level of pain as a combination of two factors, their physical pain and their psychosocial pain. This last one is also known as "suffering" and it is reflection of how physical pain affects you socially and psychologically. From now on, report them separately. From this point on, when asked to report your pain level, report only your physical pain. Use the following table for reference.  Pain Clinic Pain Levels (0-5/10)  Pain Level Score  Description  No Pain 0   Mild pain 1 Nagging, annoying, but does not interfere with basic activities of daily living (ADL). Patients are able to eat, bathe, get dressed, toileting (being able to get on and off the toilet and perform personal hygiene functions), transfer (move in and out of bed or a chair without assistance), and maintain continence (able to control bladder and bowel functions). Blood pressure and heart rate are unaffected. A normal heart rate for a healthy adult ranges from 60 to 100 bpm (beats per minute).   Mild to moderate pain 2 Noticeable and distracting. Impossible to hide from other people. More frequent flare-ups. Still possible to adapt and function close to normal. It can be very annoying and may have occasional stronger flare-ups. With discipline, patients may get used to it and adapt.   Moderate pain 3 Interferes significantly with activities of daily living (ADL). It becomes difficult to feed, bathe, get dressed, get on and off the toilet or to perform personal hygiene functions. Difficult to get in and out of bed or a chair without assistance. Very distracting. With effort, it can be ignored when deeply involved in activities.   Moderately severe pain 4 Impossible to ignore for more than a few minutes. With effort, patients may still be able to manage work or participate in some social activities. Very difficult to concentrate. Signs of autonomic nervous  system discharge are evident: dilated pupils (mydriasis); mild sweating (diaphoresis); sleep interference. Heart rate becomes elevated (>115 bpm). Diastolic blood pressure (lower number) rises above 100 mmHg. Patients find relief in laying down and not moving.   Severe pain 5 Intense and extremely unpleasant. Associated with frowning face and frequent crying. Pain overwhelms the senses.  Ability to do any activity or maintain social relationships becomes significantly limited. Conversation becomes difficult. Pacing back and forth is common, as getting into a comfortable position is nearly impossible. Pain wakes you up from deep sleep. Physical signs will be obvious: pupillary dilation; increased sweating; goosebumps; brisk reflexes; cold, clammy hands and feet; nausea, vomiting or dry heaves; loss of appetite; significant sleep disturbance with inability to fall asleep or to remain asleep. When persistent, significant weight loss is observed due to the complete loss of appetite and sleep deprivation.  Blood pressure and heart rate becomes significantly elevated. Caution: If elevated blood pressure triggers a pounding headache associated with blurred vision, then the patient should immediately seek attention at an urgent or emergency care unit, as these may be signs of an impending stroke.    Emergency Department Pain Levels (6-10/10)  Emergency Room Pain 6 Severely limiting. Requires emergency care and should not be seen or managed at an outpatient pain management facility. Communication becomes difficult and requires great effort. Assistance to reach the emergency department may be required. Facial flushing and profuse sweating along with potentially dangerous increases in heart rate and blood   pressure will be evident.   Distressing pain 7 Self-care is very difficult. Assistance is required to transport, or use restroom. Assistance to reach the emergency department will be required. Tasks requiring  coordination, such as bathing and getting dressed become very difficult.   Disabling pain 8 Self-care is no longer possible. At this level, pain is disabling. The individual is unable to do even the most "basic" activities such as walking, eating, bathing, dressing, transferring to a bed, or toileting. Fine motor skills are lost. It is difficult to think clearly.   Incapacitating pain 9 Pain becomes incapacitating. Thought processing is no longer possible. Difficult to remember your own name. Control of movement and coordination are lost.   The worst pain imaginable 10 At this level, most patients pass out from pain. When this level is reached, collapse of the autonomic nervous system occurs, leading to a sudden drop in blood pressure and heart rate. This in turn results in a temporary and dramatic drop in blood flow to the brain, leading to a loss of consciousness. Fainting is one of the body's self defense mechanisms. Passing out puts the brain in a calmed state and causes it to shut down for a while, in order to begin the healing process.    Summary: 1. Refer to this scale when providing us with your pain level. 2. Be accurate and careful when reporting your pain level. This will help with your care. 3. Over-reporting your pain level will lead to loss of credibility. 4. Even a level of 1/10 means that there is pain and will be treated at our facility. 5. High, inaccurate reporting will be documented as "Symptom Exaggeration", leading to loss of credibility and suspicions of possible secondary gains such as obtaining more narcotics, or wanting to appear disabled, for fraudulent reasons. 6. Only pain levels of 5 or below will be seen at our facility. 7. Pain levels of 6 and above will be sent to the Emergency Department and the appointment cancelled. ____________________________________________________________________________________________    

## 2018-01-21 ENCOUNTER — Telehealth: Payer: Self-pay

## 2018-01-21 NOTE — Telephone Encounter (Signed)
Post procedure phone call.   No answer.  

## 2018-01-22 ENCOUNTER — Telehealth: Payer: Self-pay | Admitting: *Deleted

## 2018-01-22 ENCOUNTER — Other Ambulatory Visit: Payer: Self-pay | Admitting: Primary Care

## 2018-01-22 ENCOUNTER — Encounter: Payer: Self-pay | Admitting: *Deleted

## 2018-01-22 DIAGNOSIS — K219 Gastro-esophageal reflux disease without esophagitis: Secondary | ICD-10-CM

## 2018-02-02 ENCOUNTER — Ambulatory Visit: Payer: Medicare Other | Admitting: Pain Medicine

## 2018-02-09 ENCOUNTER — Ambulatory Visit: Payer: Medicare Other | Admitting: Pain Medicine

## 2018-02-09 ENCOUNTER — Ambulatory Visit (INDEPENDENT_AMBULATORY_CARE_PROVIDER_SITE_OTHER): Payer: Medicare Other | Admitting: Orthopedic Surgery

## 2018-02-09 VITALS — BP 113/68 | HR 88 | Ht 70.0 in | Wt 240.0 lb

## 2018-02-09 DIAGNOSIS — Z9889 Other specified postprocedural states: Secondary | ICD-10-CM | POA: Diagnosis not present

## 2018-02-09 DIAGNOSIS — M1712 Unilateral primary osteoarthritis, left knee: Secondary | ICD-10-CM

## 2018-02-09 NOTE — Progress Notes (Signed)
Progress Note   Patient ID: Joseph [name], male   DOB: 1971/02/06, 47 y.o.   MRN: [id number]  Chief Complaint  Patient presents with  . Follow-up    Recheck on left knee.    47-year-old male with left knee pain  [facility] who advised partial knee replacement however this was canceled secondary to high hemoglobin A1c  [facility] they recommend waiting until his age was appropriate for total knee  He also went [facility] for 1 visit  Presents back with continued left medial knee pain     Review of Systems  Musculoskeletal: Negative for back pain.   No outpatient medications have been marked as taking for the 02/09/18 encounter (Office Visit) with Vickki HearingHarrison, Stanley E, MD.    Allergies  Allergen Reactions  . Azithromycin Hives  . Erythromycin Hives  . Keflex [Cephalexin] Nausea Only  . Metformin Nausea And Vomiting  . Symbicort [Budesonide-Formoterol Fumarate] Other (See Comments)    States that 3 doses were used and breathing became worse     BP 113/68   Pulse 88   Ht 5\' 10"  (1.778 m)   Wt 240 lb (108.9 kg)   BMI 34.44 kg/m   Physical Exam  Constitutional: He is oriented to person, place, and time. He appears well-developed and well-nourished.  Vital signs have been reviewed and are stable. Gen. appearance the patient is well-developed and well-nourished with normal grooming and hygiene.   Musculoskeletal:       Left knee: He exhibits no swelling, no effusion, no deformity and normal alignment. Tenderness found. Medial joint line tenderness noted.  normal  Neurological: He is alert and oriented to person, place, and time.  Skin: Skin is warm and dry. No erythema.  Psychiatric: He has a normal mood and affect.  Vitals reviewed.    Medical decision-making Encounter Diagnoses  Name Primary?  . S/P arthroscopy of left knee Yes  . Arthritis of left knee     Procedure note left knee injection  verbal consent was obtained to inject left knee joint  Timeout was completed to confirm the site of injection  The medications used were 40 mg of Depo-Medrol and 1% lidocaine 3 cc  Anesthesia was provided by ethyl chloride and the skin was prepped with alcohol.  After cleaning the skin with alcohol a 20-gauge needle was used to inject the left knee joint. There were no complications. A sterile bandage was applied.   Fuller CanadaStanley Harrison, MD 02/09/2018 9:16 AM

## 2018-02-10 ENCOUNTER — Ambulatory Visit: Payer: Medicare Other | Attending: Nurse Practitioner | Admitting: Nurse Practitioner

## 2018-02-10 ENCOUNTER — Other Ambulatory Visit: Payer: Self-pay

## 2018-02-10 VITALS — BP 118/72 | HR 84 | Temp 98.1°F | Resp 16 | Ht 70.0 in | Wt 235.0 lb

## 2018-02-10 DIAGNOSIS — F17210 Nicotine dependence, cigarettes, uncomplicated: Secondary | ICD-10-CM | POA: Diagnosis not present

## 2018-02-10 DIAGNOSIS — Z79899 Other long term (current) drug therapy: Secondary | ICD-10-CM | POA: Diagnosis not present

## 2018-02-10 DIAGNOSIS — T402X5A Adverse effect of other opioids, initial encounter: Secondary | ICD-10-CM

## 2018-02-10 DIAGNOSIS — G894 Chronic pain syndrome: Secondary | ICD-10-CM

## 2018-02-10 DIAGNOSIS — Z6833 Body mass index (BMI) 33.0-33.9, adult: Secondary | ICD-10-CM | POA: Diagnosis not present

## 2018-02-10 DIAGNOSIS — Z79891 Long term (current) use of opiate analgesic: Secondary | ICD-10-CM

## 2018-02-10 DIAGNOSIS — F419 Anxiety disorder, unspecified: Secondary | ICD-10-CM | POA: Insufficient documentation

## 2018-02-10 DIAGNOSIS — G4733 Obstructive sleep apnea (adult) (pediatric): Secondary | ICD-10-CM | POA: Insufficient documentation

## 2018-02-10 DIAGNOSIS — M25561 Pain in right knee: Secondary | ICD-10-CM | POA: Diagnosis not present

## 2018-02-10 DIAGNOSIS — K5909 Other constipation: Secondary | ICD-10-CM | POA: Diagnosis not present

## 2018-02-10 DIAGNOSIS — K219 Gastro-esophageal reflux disease without esophagitis: Secondary | ICD-10-CM | POA: Diagnosis not present

## 2018-02-10 DIAGNOSIS — G8929 Other chronic pain: Secondary | ICD-10-CM | POA: Diagnosis not present

## 2018-02-10 DIAGNOSIS — J449 Chronic obstructive pulmonary disease, unspecified: Secondary | ICD-10-CM | POA: Insufficient documentation

## 2018-02-10 DIAGNOSIS — Z881 Allergy status to other antibiotic agents status: Secondary | ICD-10-CM | POA: Diagnosis not present

## 2018-02-10 DIAGNOSIS — Z7984 Long term (current) use of oral hypoglycemic drugs: Secondary | ICD-10-CM | POA: Insufficient documentation

## 2018-02-10 DIAGNOSIS — K5903 Drug induced constipation: Secondary | ICD-10-CM | POA: Diagnosis not present

## 2018-02-10 DIAGNOSIS — F329 Major depressive disorder, single episode, unspecified: Secondary | ICD-10-CM | POA: Insufficient documentation

## 2018-02-10 DIAGNOSIS — Z5181 Encounter for therapeutic drug level monitoring: Secondary | ICD-10-CM | POA: Diagnosis not present

## 2018-02-10 DIAGNOSIS — E785 Hyperlipidemia, unspecified: Secondary | ICD-10-CM | POA: Diagnosis not present

## 2018-02-10 DIAGNOSIS — M25562 Pain in left knee: Secondary | ICD-10-CM | POA: Diagnosis not present

## 2018-02-10 DIAGNOSIS — M1712 Unilateral primary osteoarthritis, left knee: Secondary | ICD-10-CM | POA: Diagnosis not present

## 2018-02-10 DIAGNOSIS — E119 Type 2 diabetes mellitus without complications: Secondary | ICD-10-CM | POA: Diagnosis not present

## 2018-02-10 MED ORDER — TAPENTADOL HCL 50 MG PO TABS: 50.0000 mg | ORAL_TABLET | Freq: Three times a day (TID) | ORAL | 0 refills | Status: DC | PRN

## 2018-02-10 MED ORDER — LUBIPROSTONE 24 MCG PO CAPS: 24.0000 ug | ORAL_CAPSULE | Freq: Two times a day (BID) | ORAL | 2 refills | Status: DC

## 2018-02-10 NOTE — Progress Notes (Signed)
Patient's Name: Joseph [name]  MRN: [id number]  Referring Provider: Pleas Koch, NP  DOB: [birth year]  PCP: Pleas Koch, NP  DOS: 02/10/2018  Note by: Vevelyn Francois NP  Service setting: Ambulatory outpatient  Specialty: Interventional Pain Management  Location: [facility] (AMB) Pain Management Facility    Patient type: Established    Primary Reason(s) for Visit: Encounter for prescription drug management & post-procedure evaluation of chronic illness with mild to moderate exacerbation(Level of risk: moderate) CC: Knee Pain (left)  HPI  [name] is a 47 y.o. year old, male patient, who comes today for a post-procedure evaluation and medication management. He has Mediastinal abnormality; Cigarette smoker; Chronic pain syndrome; Diabetes ([place]); Intrinsic asthma; Medial meniscus, posterior horn derangement; S/P knee surgery; Acute meniscal tear of left knee; Testosterone deficiency; OSA (obstructive sleep apnea); Morbid obesity ([place]); Anxiety and depression; Bucket handle tear of meniscus of right knee; Skin lesions; GERD (gastroesophageal reflux disease); Osteoarthritis of the knee (Left); Post-void dribbling; COPD without exacerbation ([place]); Hyperlipidemia; Chronic constipation; Disorder of skeletal system; Chronic knee pain (Primary Area of Pain) (Bilateral) (L>R); Chronic wrist pain (Secondary Area of Pain) (Left); Insomnia; Male hypogonadism; Pharmacologic therapy; Problems influencing health status; Long term prescription opiate use; Opiate use; Tinea corporis; Tinea versicolor; Arthropathy of left knee; and Opioid-induced constipation (OIC) on their problem list. His primarily concern today is the Knee Pain (left)  Pain Assessment: Location:   Knee Radiating: inner knee to calve Onset: More than a month ago Duration: Chronic pain Quality: Aching, Discomfort Severity: 1 /10 (self-reported pain score)  Note: Reported level is compatible with observation.                          Effect  on ADL: affects gait and stability Modifying factors: not putting wieght on knee  Joseph [name] was last seen on 01/20/2018 for a procedure. During today's appointment we reviewed Joseph [name] post-procedure results, as well as his outpatient medication regimen.  Further details on both, my assessment(s), as well as the proposed treatment plan, please see below.  Controlled Substance Pharmacotherapy Assessment REMS (Risk Evaluation and Mitigation Strategy)  Analgesic: Nucynta 50 mg 3 times daily(150 mg/dayof Nucynta) MME/day:'60mg'$ /day   Ignatius Specking, RN  02/10/2018 11:22 AM  Sign at close encounter Nursing Pain Medication Assessment:  Safety precautions to be maintained throughout the outpatient stay will include: orient to surroundings, keep bed in low position, maintain call bell within reach at all times, provide assistance with transfer out of bed and ambulation.  Medication Inspection Compliance: [name] did not comply with our request to bring his pills to be counted. He was reminded that bringing the medication bottles, even when empty, is a requirement.  Medication: None brought in. Pill/Patch Count: None available to be counted. Bottle Appearance: No container available. Did not bring bottle(s) to appointment. Filled Date: N/A Last Medication intake:  Yesterday   Pharmacokinetics: Liberation and absorption (onset of action): WNL Distribution (time to peak effect): WNL Metabolism and excretion (duration of action): WNL         Pharmacodynamics: Desired effects: Analgesia: Joseph [name] reports >86% benefit. Functional ability: Patient reports that medication allows him to accomplish basic ADLs Clinically meaningful improvement in function (CMIF): Sustained CMIF goals met Perceived effectiveness: Described as relatively effective, allowing for increase in activities of daily living (ADL) Undesirable effects: Side-effects or Adverse reactions: None reported Monitoring: [place]  PMP: Online review of the past 59-monthperiod conducted.  Compliant with practice rules and regulations Last UDS on record: Summary  Date Value Ref Range Status  11/05/2017 FINAL  Final    Comment:    ==================================================================== TOXASSURE COMP DRUG ANALYSIS,UR ==================================================================== Test                             Result       Flag       Units Drug Present and Declared for Prescription Verification   Tapentadol                     11414        EXPECTED   ng/mg creat    Source of tapentadol is a scheduled prescription medication.   Bupropion                      PRESENT      EXPECTED   Hydroxybupropion               PRESENT      EXPECTED    Hydroxybupropion is an expected metabolite of bupropion.   Citalopram                     PRESENT      EXPECTED   Desmethylcitalopram            PRESENT      EXPECTED    Desmethylcitalopram is an expected metabolite of citalopram or    the enantiomeric form, escitalopram.   Trazodone                      PRESENT      EXPECTED   1,3 chlorophenyl piperazine    PRESENT      EXPECTED    1,3-chlorophenyl piperazine is an expected metabolite of    trazodone.   Diphenhydramine                PRESENT      EXPECTED Drug Absent but Declared for Prescription Verification   Alprazolam                     Not Detected UNEXPECTED ng/mg creat ==================================================================== Test                      Result    Flag   Units      Ref Range   Creatinine              66               mg/dL      >=20 ==================================================================== Declared Medications:  The flagging and interpretation on this report are based on the  following declared medications.  Unexpected results may arise from  inaccuracies in the declared medications.  **Note: The testing scope of this panel includes these medications:  Alprazolam  (Xanax)  Bupropion (Wellbutrin)  Citalopram  Diphenhydramine (Benadryl)  Tapentadol  Trazodone  **Note: The testing scope of this panel does not include following  reported medications:  Albuterol (Combivent)  Albuterol (Proventil)  Atorvastatin (Lipitor)  Cinnamon Bark  Empagliflozin (Jardiance)  Fluticasone (Breo)  Glipizide  Ipratropium  Ipratropium (Combivent)  Lansoprazole (Prevacid)  Lubiprostone (Amitiza)  Metformin (Glucophage)  Multivitamin  Nabumetone (Relafen)  Sildenafil  Supplement (Saw Palmetto)  Tamsulosin  Testosterone  Triamcinolone  Vilanterol (Breo) ==================================================================== For clinical consultation, please call [phone]. ====================================================================  UDS interpretation: Compliant          Medication Assessment Form: Reviewed. Patient indicates being compliant with therapy Treatment compliance: Compliant Risk Assessment Profile: Aberrant behavior: See prior evaluations. None observed or detected today Comorbid factors increasing risk of overdose: See prior notes. No additional risks detected today Risk of substance use disorder (SUD): Low Opioid Risk Tool - 02/10/18 1118      Family History of Substance Abuse   Alcohol  Negative    Illegal Drugs  Negative    Rx Drugs  Negative      Personal History of Substance Abuse   Alcohol  Negative    Illegal Drugs  Negative    Rx Drugs  Negative      Total Score   Opioid Risk Tool Scoring  0    Opioid Risk Interpretation  Low Risk      ORT Scoring interpretation table:  Score <3 = Low Risk for SUD  Score between 4-7 = Moderate Risk for SUD  Score >8 = High Risk for Opioid Abuse   Risk Mitigation Strategies:  Patient Counseling: Covered Patient-Prescriber Agreement (PPA): Present and active  Notification to other healthcare providers: Done  Pharmacologic Plan: No change in therapy, at this time.              Post-Procedure Assessment  01/20/2018 Procedure: Left genicular nerve block Pre-procedure pain score:        /10 Post-procedure pain score: 0/10         Influential Factors: BMI: 33.72 kg/m Intra-procedural challenges: None observed.         Assessment challenges: None detected.              Reported side-effects: None.        Post-procedural adverse reactions or complications: None reported         Sedation: Please see nurses note. When no sedatives are used, the analgesic levels obtained are directly associated to the effectiveness of the local anesthetics. However, when sedation is provided, the level of analgesia obtained during the initial 1 hour following the intervention, is believed to be the result of a combination of factors. These factors may include, but are not limited to: 1. The effectiveness of the local anesthetics used. 2. The effects of the analgesic(s) and/or anxiolytic(s) used. 3. The degree of discomfort experienced by the patient at the time of the procedure. 4. The patients ability and reliability in recalling and recording the events. 5. The presence and influence of possible secondary gains and/or psychosocial factors. Reported result: Relief experienced during the 1st hour after the procedure: 100 % (Ultra-Short Term Relief)            Interpretative annotation: Clinically appropriate result. Analgesia during this period is likely to be Local Anesthetic and/or IV Sedative (Analgesic/Anxiolytic) related.          Effects of local anesthetic: The analgesic effects attained during this period are directly associated to the localized infiltration of local anesthetics and therefore cary significant diagnostic value as to the etiological location, or anatomical origin, of the pain. Expected duration of relief is directly dependent on the pharmacodynamics of the local anesthetic used. Long-acting (4-6 hours) anesthetics used.  Reported result: Relief during the next 4  to 6 hour after the procedure: 100 % (Short-Term Relief)            Interpretative annotation: Clinically appropriate result. Analgesia during this period is likely to be Local Anesthetic-related.  Long-term benefit: Defined as the period of time past the expected duration of local anesthetics (1 hour for short-acting and 4-6 hours for long-acting). With the possible exception of prolonged sympathetic blockade from the local anesthetics, benefits during this period are typically attributed to, or associated with, other factors such as analgesic sensory neuropraxia, antiinflammatory effects, or beneficial biochemical changes provided by agents other than the local anesthetics.  Reported result: Extended relief following procedure: 50 % (Long-Term Relief)            Interpretative annotation: Clinically appropriate result. Good relief. No permanent benefit expected. Inflammation plays a part in the etiology to the pain.          Current benefits: Defined as reported results that persistent at this point in time.   Analgesia: >50 %            Function: Mr. Andujo reports improvement in function ROM: Mr. Hislop reports improvement in ROM Interpretative annotation: Ongoing benefit.    Effective diagnostic intervention.          Interpretation: Results would suggest Mr. Boughner  will be a good candidate for Radiofrequency Ablation.                  Plan:  Please see "Plan of Care" for details.                Laboratory Chemistry  Inflammation Markers (CRP: Acute Phase) (ESR: Chronic Phase) Lab Results  Component Value Date   CRP 0.7 11/05/2017   ESRSEDRATE 11 11/05/2017   LATICACIDVEN 2.1 01/24/2013                         Rheumatology Markers No results found for: Elayne Guerin, [facility]              Renal Function Markers Lab Results  Component Value Date   BUN 10 11/05/2017   CREATININE 1.03 11/05/2017   GFRAA 100 11/05/2017   GFRNONAA 86 11/05/2017                  Hepatic Function Markers Lab Results  Component Value Date   AST 13 11/05/2017   ALT 19 05/08/2017   ALBUMIN 4.8 11/05/2017   ALKPHOS 100 11/05/2017   LIPASE 38 01/24/2013                 Electrolytes Lab Results  Component Value Date   NA 138 11/05/2017   K 4.6 11/05/2017   CL 97 11/05/2017   CALCIUM 9.6 11/05/2017   MG 2.1 11/05/2017                        Neuropathy Markers Lab Results  Component Value Date   VITAMINB12 341 11/05/2017   HGBA1C 8.3 (H) 11/17/2017                 Bone Pathology Markers Lab Results  Component Value Date   25OHVITD1 42 11/05/2017   25OHVITD2 <1.0 11/05/2017   25OHVITD3 42 11/05/2017   TESTOSTERONE 208.98 (L) 04/17/2017                         Coagulation Parameters Lab Results  Component Value Date   PLT 214 03/17/2017   DDIMER 0.46 03/07/2012                 Cardiovascular Markers Lab Results  Component Value  Date   TROPONINI <0.30 12/09/2013   HGB 14.0 03/17/2017   HCT 41.1 03/17/2017                 CA Markers No results found for: CEA, CA125, LABCA2               Note: Lab results reviewed.  Recent Diagnostic Imaging Results  DG C-Arm 1-60 Min-No Report Fluoroscopy was utilized by the requesting physician.  No radiographic  interpretation.   Complexity Note: Imaging results reviewed. Results shared with Mr. Tawil, using Layman's terms.                         Meds   Current Outpatient Medications:  .  albuterol (PROVENTIL) (2.5 MG/3ML) 0.083% nebulizer solution, USE 1 VIAL VIA NEBULIZER FOUR TIMES DAILY AS NEEDED FOR SHORTNESS OF BREATH, Disp: 360 mL, Rfl: 0 .  atorvastatin (LIPITOR) 20 MG tablet, Take 1 tablet (20 mg total) by mouth every evening., Disp: 90 tablet, Rfl: 3 .  buPROPion (WELLBUTRIN SR) 150 MG 12 hr tablet, Take 1 tablet (150 mg total) by mouth 2 (two) times daily., Disp: 180 tablet, Rfl: 1 .  celecoxib (CELEBREX) 100 MG capsule, Take 100 mg by mouth 2 (two) times daily., Disp: ,  Rfl:  .  ciclopirox (LOPROX) 0.77 % cream, Apply topically 2 (two) times daily., Disp: 30 g, Rfl: 0 .  CINNAMON PO, Take 2,800 mg by mouth daily. EACH TABLET IS 1400 MG.  TAKES 2 CAPSULES DAILY, Disp: , Rfl:  .  citalopram (CELEXA) 40 MG tablet, TAKE ONE (1) TABLET BY MOUTH EVERY DAY, Disp: 30 tablet, Rfl: 11 .  Continuous Blood Gluc Receiver (FREESTYLE LIBRE 14 DAY READER) DEVI, 1 Device by Does not apply route 3 (three) times daily., Disp: 1 Device, Rfl: 0 .  Continuous Blood Gluc Sensor (FREESTYLE LIBRE 14 DAY SENSOR) MISC, 1 Device by Does not apply route 3 (three) times daily., Disp: 1 each, Rfl: 0 .  diphenhydrAMINE (BENADRYL) 25 MG tablet, Take 25 mg by mouth 2 (two) times daily., Disp: , Rfl:  .  empagliflozin (JARDIANCE) 10 MG TABS tablet, Take 10 mg by mouth every morning., Disp: 90 tablet, Rfl: 2 .  fluconazole (DIFLUCAN) 150 MG tablet, Take 2 tablets now, repeat in one week., Disp: 4 tablet, Rfl: 0 .  fluticasone furoate-vilanterol (BREO ELLIPTA) 200-25 MCG/INH AEPB, Inhale 1 puff into the lungs daily., Disp: 30 each, Rfl: 11 .  glipiZIDE (GLUCOTROL) 5 MG tablet, TAKE TWO TABLETS BY MOUTH TWICE DAILY, Disp: 360 tablet, Rfl: 1 .  ipratropium (ATROVENT) 0.02 % nebulizer solution, USE 1 VIAL IN NEBULIZER FOUR TIMES DAILY AS NEEDED, Disp: 300 mL, Rfl: 0 .  Ipratropium-Albuterol (COMBIVENT) 20-100 MCG/ACT AERS respimat, Inhale 1 puff into the lungs every 6 (six) hours as needed for wheezing., Disp: 1 Inhaler, Rfl: 2 .  lansoprazole (PREVACID) 30 MG capsule, TAKE ONE CAPSULE BY MOUTH TWICE A DAY BEFORE A MEAL, Disp: 180 capsule, Rfl: 1 .  metFORMIN (GLUCOPHAGE-XR) 500 MG 24 hr tablet, TAKE 1 TABLET DAILY WITH BREAKFAST FOR 2WEEKS, THEN ADVANCE TO 2 TABLETS WITH BREAKFAST THEREAFTER, Disp: 180 tablet, Rfl: 1 .  Multiple Vitamin (MULTIVITAMIN WITH MINERALS) TABS, Take 1 tablet by mouth every morning. Men's once daily multivitamin packet (vitamin e, calcium, ginseng), Disp: , Rfl:  .  Saw  Palmetto, Serenoa repens, (SAW PALMETTO PO), Take 1 capsule by mouth daily., Disp: , Rfl:  .  tamsulosin (FLOMAX) 0.4  MG CAPS capsule, TAKE ONE (1) CAPSULE EACH DAY, Disp: 90 capsule, Rfl: 1 .  testosterone (ANDROGEL) 50 MG/5GM (1%) GEL, Place 5 g onto the skin daily., Disp: , Rfl:  .  traZODone (DESYREL) 150 MG tablet, Take 1 tablet (150 mg total) by mouth at bedtime., Disp: 90 tablet, Rfl: 1 .  triamcinolone cream (KENALOG) 0.1 %, Apply 1 application topically 2 (two) times daily., Disp: 45 g, Rfl: 0 .  [START ON 02/11/2018] lubiprostone (AMITIZA) 24 MCG capsule, Take 1 capsule (24 mcg total) by mouth 2 (two) times daily., Disp: 60 capsule, Rfl: 2 .  [START ON 02/11/2018] tapentadol (NUCYNTA) 50 MG tablet, Take 1 tablet (50 mg total) by mouth every 8 (eight) hours as needed for severe pain. Do not take if you have epilepsy or a history of seizures. Swallow tablets whole. Do not chew, crush or dissolve., Disp: 90 tablet, Rfl: 0  ROS  Constitutional: Denies any fever or chills Gastrointestinal: No reported hemesis, hematochezia, vomiting, or acute GI distress Musculoskeletal: Denies any acute onset joint swelling, redness, loss of ROM, or weakness Neurological: No reported episodes of acute onset apraxia, aphasia, dysarthria, agnosia, amnesia, paralysis, loss of coordination, or loss of consciousness  Allergies  Mr. Stankus is allergic to azithromycin; erythromycin; keflex [cephalexin]; metformin; and symbicort [budesonide-formoterol fumarate].  [place]  Drug: Mr. Eskew  reports that he does not use drugs. Alcohol:  reports that he does not drink alcohol. Tobacco:  reports that he has been smoking cigarettes.  He started smoking about 24 years ago. He has a 30.00 pack-year smoking history. He has quit using smokeless tobacco. Medical:  has a past medical history of Anxiety, Arthritis, Asthma, Chronic back pain, Chronic pain of left knee, COPD (chronic obstructive pulmonary disease) ([place]), Diabetes  mellitus, GERD (gastroesophageal reflux disease), INGUINAL PAIN, LEFT (10/03/2009), Shortness of breath, and Sleep apnea. Surgical: Mr. Lippy  has a past surgical history that includes Carpal tunnel release (Left); Shoulder Arthrocentesis (Right); Vasectomy; Hernia repair (Bilateral); Knee arthroscopy with medial menisectomy (Left, 04/22/2014); Knee arthroscopy with medial menisectomy (Right, 12/06/2015); Cataract extraction w/PHACO (Right, 03/11/2016); ORIF wrist fracture (Left, 12/19/2016); and Knee arthroscopy with medial menisectomy (Left, 03/20/2017). Family: family history includes Clotting disorder in his maternal grandmother; Emphysema in his maternal grandfather and maternal grandmother.  Constitutional Exam  General appearance: Well nourished, well developed, and well hydrated. In no apparent acute distress Vitals:   02/10/18 1111  BP: 118/72  Pulse: 84  Resp: 16  Temp: 98.1 F (36.7 C)  SpO2: 98%  Weight: 235 lb (106.6 kg)  Height: _0  (1.778 m)   BMI Assessment: Estimated body mass index is 33.72 kg/m as calculated from the following:   Height as of this encounter: _1  (1.778 m).   Weight as of this encounter: 235 lb (106.6 kg).  BMI interpretation table: BMI level Category Range association with higher incidence of chronic pain  <18 kg/m2 Underweight   18.5-24.9 kg/m2 Ideal body weight   25-29.9 kg/m2 Overweight Increased incidence by 20%  30-34.9 kg/m2 Obese (Class I) Increased incidence by 68%  35-39.9 kg/m2 Severe obesity (Class II) Increased incidence by 136%  >40 kg/m2 Extreme obesity (Class III) Increased incidence by 254%   BMI Readings from Last 4 Encounters:  02/10/18 33.72 kg/m  02/09/18 34.44 kg/m  01/20/18 33.72 kg/m  01/12/18 33.00 kg/m   Wt Readings from Last 4 Encounters:  02/10/18 235 lb (106.6 kg)  02/09/18 240 lb (108.9 kg)  01/20/18 235 lb (106.6 kg)  01/12/18 230 lb (104.3 kg)  Psych/Mental status: Alert, oriented x 3 (person, place, &  time)       Eyes: PERLA Respiratory: No evidence of acute respiratory distress   Gait & Posture Assessment  Ambulation: Unassisted Gait: Relatively normal for age and body habitus Posture: WNL   Lower Extremity Exam    Side: Right lower extremity  Side: Left lower extremity  Skin & Extremity Inspection: Skin color, temperature, and hair growth are WNL. No peripheral edema or cyanosis. No masses, redness, swelling, asymmetry, or associated skin lesions. No contractures.  Skin & Extremity Inspection: Skin color, temperature, and hair growth are WNL. No peripheral edema or cyanosis. No masses, redness, swelling, asymmetry, or associated skin lesions. No contractures.  Functional ROM: Adequate ROM          Functional ROM: Adequate ROM          Muscle Tone/Strength: Functionally intact. No obvious neuro-muscular anomalies detected.  Muscle Tone/Strength: Functionally intact. No obvious neuro-muscular anomalies detected.  Sensory (Neurological): Unimpaired  Sensory (Neurological): Unimpaired  Palpation: No palpable anomalies  Palpation: No palpable anomalies   Assessment  Primary Diagnosis & Pertinent Problem List: The primary encounter diagnosis was Chronic knee pain (Primary Area of Pain) (Bilateral) (L>R). Diagnoses of Osteoarthritis of the knee (Left), Chronic pain syndrome, Drug-induced constipation, Opioid-induced constipation (OIC), and Long term prescription opiate use were also pertinent to this visit.  Status Diagnosis  Persistent Persistent Controlled 1. Chronic knee pain (Primary Area of Pain) (Bilateral) (L>R)   2. Osteoarthritis of the knee (Left)   3. Chronic pain syndrome   4. Drug-induced constipation   5. Opioid-induced constipation (OIC)   6. Long term prescription opiate use     Problems updated and reviewed during this visit: No problems updated. Plan of Care  Pharmacotherapy (Medications Ordered): Meds ordered this encounter  Medications  . lubiprostone (AMITIZA)  24 MCG capsule    Sig: Take 1 capsule (24 mcg total) by mouth 2 (two) times daily.    Dispense:  60 capsule    Refill:  2    Do not place medication on "Automatic Refill". Fill one day early if pharmacy is closed on scheduled refill date.    Order Specific Question:   Supervising Provider    Answer:   Milinda Pointer [phone]  . tapentadol (NUCYNTA) 50 MG tablet    Sig: Take 1 tablet (50 mg total) by mouth every 8 (eight) hours as needed for severe pain. Do not take if you have epilepsy or a history of seizures. Swallow tablets whole. Do not chew, crush or dissolve.    Dispense:  90 tablet    Refill:  0    Do not place this medication, or any other prescription from our practice, on "Automatic Refill". Patient may have prescription filled one day early if pharmacy is closed on scheduled refill date. Do not fill until: 02/11/2018 To last until:03/13/2018    Order Specific Question:   Supervising Provider    Answer:   Milinda Pointer [[id number]]   New Prescriptions   No medications on file   Medications administered today: Arn Medal "Richard" had no medications administered during this visit. Lab-work, procedure(s), and/or referral(s): Orders Placed This Encounter  Procedures  . ToxASSURE Select 13 (MW), Urine   Imaging and/or referral(s): None  Interventional therapies: Planned, scheduled, and/or pending:     Left genicular RFA   Considering:   Diagnostic Left intra-articular knee injection Diagnostic Left genicular nerve block Possible Left genicular  RFA Diagnostic Left Hyalgan series    Palliative PRN treatment(s):   Not at this time.   Provider-requested follow-up: Return in about 4 weeks (around 03/10/2018) for MedMgmt with Me Donella Stade Edison Pace).  Future Appointments  Date Time Provider [place]  02/16/2018  2:30 PM Pleas Koch, NP [facility]  03/10/2018 11:15 AM Vevelyn Francois, NP [facility] None  05/20/2018 10:30 AM Eustace Pen, LPN [facility]  05/08/3015  9:00 AM Pleas Koch, NP [facility]   Primary Care Physician: Pleas Koch, NP Location: [facility] Note by: Vevelyn Francois NP Date: 02/10/2018; Time: 3:16 PM  Pain Score Disclaimer: We use the NRS-11 scale. This is a self-reported, subjective measurement of pain severity with only modest accuracy. It is used primarily to identify changes within a particular patient. It must be understood that outpatient pain scales are significantly less accurate that those used for research, where they can be applied under ideal controlled circumstances with minimal exposure to variables. In reality, the score is likely to be a combination of pain intensity and pain affect, where pain affect describes the degree of emotional arousal or changes in action readiness caused by the sensory experience of pain. Factors such as social and work situation, setting, emotional state, anxiety levels, expectation, and prior pain experience may influence pain perception and show large inter-individual differences that may also be affected by time variables.  Patient instructions provided during this appointment: Patient Instructions  ____________________________________________________________________________________________  Medication Rules  Applies to: All patients receiving prescriptions (written or electronic).  Pharmacy of record: Pharmacy where electronic prescriptions will be sent. If written prescriptions are taken to a different pharmacy, please inform the nursing staff. The pharmacy listed in the electronic medical record should be the one where you would like electronic prescriptions to be sent.  Prescription refills: Only during scheduled appointments. Applies to both, written and electronic prescriptions.  NOTE: The following applies primarily to controlled substances (Opioid* Pain Medications).   Patient's responsibilities: 1. Pain Pills: Bring  all pain pills to every appointment (except for procedure appointments). 2. Pill Bottles: Bring pills in original pharmacy bottle. Always bring newest bottle. Bring bottle, even if empty. 3. Medication refills: You are responsible for knowing and keeping track of what medications you need refilled. The day before your appointment, write a list of all prescriptions that need to be refilled. Bring that list to your appointment and give it to the admitting nurse. Prescriptions will be written only during appointments. If you forget a medication, it will not be "Called in", "Faxed", or "electronically sent". You will need to get another appointment to get these prescribed. 4. Prescription Accuracy: You are responsible for carefully inspecting your prescriptions before leaving our office. Have the discharge nurse carefully go over each prescription with you, before taking them home. Make sure that your name is accurately spelled, that your address is correct. Check the name and dose of your medication to make sure it is accurate. Check the number of pills, and the written instructions to make sure they are clear and accurate. Make sure that you are given enough medication to last until your next medication refill appointment. 5. Taking Medication: Take medication as prescribed. Never take more pills than instructed. Never take medication more frequently than prescribed. Taking less pills or less frequently is permitted and encouraged, when it comes to controlled substances (written prescriptions).  6. Inform other Doctors: Always inform, all of your healthcare providers, of  all the medications you take. 7. Pain Medication from other Providers: You are not allowed to accept any additional pain medication from any other Doctor or Healthcare provider. There are two exceptions to this rule. (see below) In the event that you require additional pain medication, you are responsible for notifying us, as stated  below. 8. Medication Agreement: You are responsible for carefully reading and following our Medication Agreement. This must be signed before receiving any prescriptions from our practice. Safely store a copy of your signed Agreement. Violations to the Agreement will result in no further prescriptions. (Additional copies of our Medication Agreement are available upon request.) 9. Laws, Rules, & Regulations: All patients are expected to follow all Federal and [employer], [employer], Rules, [employer]. Ignorance of the Laws does not constitute a valid excuse. The use of any illegal substances is prohibited. 10. Adopted CDC guidelines & recommendations: Target dosing levels will be at or below 60 MME/day. Use of benzodiazepines** is not recommended.  Exceptions: There are only two exceptions to the rule of not receiving pain medications from other Healthcare Providers. 1. Exception #1 (Emergencies): In the event of an emergency (i.e.: accident requiring emergency care), you are allowed to receive additional pain medication. However, you are responsible for: As soon as you are able, call our office [phone], at any time of the day or night, and leave a message stating your name, the date and nature of the emergency, and the name and dose of the medication prescribed. In the event that your call is answered by a member of our staff, make sure to document and save the date, time, and the name of the person that took your information.  2. Exception #2 (Planned Surgery): In the event that you are scheduled by another doctor or dentist to have any type of surgery or procedure, you are allowed (for a period no longer than 30 days), to receive additional pain medication, for the acute post-op pain. However, in this case, you are responsible for picking up a copy of our "Post-op Pain Management for Surgeons" handout, and giving it to your surgeon or dentist. This document is available at our office, and does not  require an appointment to obtain it. Simply go to our office during business hours (Monday-Thursday from 8:00 AM to 4:00 PM) (Friday 8:00 AM to 12:00 Noon) or if you have a scheduled appointment with [place], prior to your surgery, and ask for it by name. In addition, you will need to provide [place] with your name, name of your surgeon, type of surgery, and date of procedure or surgery.  *Opioid medications include: morphine, codeine, oxycodone, oxymorphone, hydrocodone, hydromorphone, meperidine, tramadol, tapentadol, buprenorphine, fentanyl, methadone. **Benzodiazepine medications include: diazepam (Valium), alprazolam (Xanax), clonazepam (Klonopine), lorazepam (Ativan), clorazepate (Tranxene), chlordiazepoxide (Librium), estazolam (Prosom), oxazepam (Serax), temazepam (Restoril), triazolam (Halcion) (Last updated: 01/29/2018) ____________________________________________________________________________________________

## 2018-02-10 NOTE — Patient Instructions (Signed)
____________________________________________________________________________________________  Medication Rules  Applies to: All patients receiving prescriptions (written or electronic).  Pharmacy of record: Pharmacy where electronic prescriptions will be sent. If written prescriptions are taken to a different pharmacy, please inform the nursing staff. The pharmacy listed in the electronic medical record should be the one where you would like electronic prescriptions to be sent.  Prescription refills: Only during scheduled appointments. Applies to both, written and electronic prescriptions.  NOTE: The following applies primarily to controlled substances (Opioid* Pain Medications).   Patient's responsibilities: 1. Pain Pills: Bring all pain pills to every appointment (except for procedure appointments). 2. Pill Bottles: Bring pills in original pharmacy bottle. Always bring newest bottle. Bring bottle, even if empty. 3. Medication refills: You are responsible for knowing and keeping track of what medications you need refilled. The day before your appointment, write a list of all prescriptions that need to be refilled. Bring that list to your appointment and give it to the admitting nurse. Prescriptions will be written only during appointments. If you forget a medication, it will not be "Called in", "Faxed", or "electronically sent". You will need to get another appointment to get these prescribed. 4. Prescription Accuracy: You are responsible for carefully inspecting your prescriptions before leaving our office. Have the discharge nurse carefully go over each prescription with you, before taking them home. Make sure that your name is accurately spelled, that your address is correct. Check the name and dose of your medication to make sure it is accurate. Check the number of pills, and the written instructions to make sure they are clear and accurate. Make sure that you are given enough medication to last  until your next medication refill appointment. 5. Taking Medication: Take medication as prescribed. Never take more pills than instructed. Never take medication more frequently than prescribed. Taking less pills or less frequently is permitted and encouraged, when it comes to controlled substances (written prescriptions).  6. Inform other Doctors: Always inform, all of your healthcare providers, of all the medications you take. 7. Pain Medication from other Providers: You are not allowed to accept any additional pain medication from any other Doctor or Healthcare provider. There are two exceptions to this rule. (see below) In the event that you require additional pain medication, you are responsible for notifying us, as stated below. 8. Medication Agreement: You are responsible for carefully reading and following our Medication Agreement. This must be signed before receiving any prescriptions from our practice. Safely store a copy of your signed Agreement. Violations to the Agreement will result in no further prescriptions. (Additional copies of our Medication Agreement are available upon request.) 9. Laws, Rules, & Regulations: All patients are expected to follow all Federal and State Laws, Statutes, Rules, & Regulations. Ignorance of the Laws does not constitute a valid excuse. The use of any illegal substances is prohibited. 10. Adopted CDC guidelines & recommendations: Target dosing levels will be at or below 60 MME/day. Use of benzodiazepines** is not recommended.  Exceptions: There are only two exceptions to the rule of not receiving pain medications from other Healthcare Providers. 1. Exception #1 (Emergencies): In the event of an emergency (i.e.: accident requiring emergency care), you are allowed to receive additional pain medication. However, you are responsible for: As soon as you are able, call our office [phone], at any time of the day or night, and leave a message stating your name, the  date and nature of the emergency, and the name and dose of the medication   prescribed. In the event that your call is answered by a member of our staff, make sure to document and save the date, time, and the name of the person that took your information.  2. Exception #2 (Planned Surgery): In the event that you are scheduled by another doctor or dentist to have any type of surgery or procedure, you are allowed (for a period no longer than 30 days), to receive additional pain medication, for the acute post-op pain. However, in this case, you are responsible for picking up a copy of our "Post-op Pain Management for Surgeons" handout, and giving it to your surgeon or dentist. This document is available at our office, and does not require an appointment to obtain it. Simply go to our office during business hours (Monday-Thursday from 8:00 AM to 4:00 PM) (Friday 8:00 AM to 12:00 Noon) or if you have a scheduled appointment with us, prior to your surgery, and ask for it by name. In addition, you will need to provide us with your name, name of your surgeon, type of surgery, and date of procedure or surgery.  *Opioid medications include: morphine, codeine, oxycodone, oxymorphone, hydrocodone, hydromorphone, meperidine, tramadol, tapentadol, buprenorphine, fentanyl, methadone. **Benzodiazepine medications include: diazepam (Valium), alprazolam (Xanax), clonazepam (Klonopine), lorazepam (Ativan), clorazepate (Tranxene), chlordiazepoxide (Librium), estazolam (Prosom), oxazepam (Serax), temazepam (Restoril), triazolam (Halcion) (Last updated: 01/29/2018) ____________________________________________________________________________________________    

## 2018-02-10 NOTE — Progress Notes (Signed)
Nursing Pain Medication Assessment:  Safety precautions to be maintained throughout the outpatient stay will include: orient to surroundings, keep bed in low position, maintain call bell within reach at all times, provide assistance with transfer out of bed and ambulation.  Medication Inspection Compliance: [name] did not comply with our request to bring his pills to be counted. He was reminded that bringing the medication bottles, even when empty, is a requirement.  Medication: None brought in. Pill/Patch Count: None available to be counted. Bottle Appearance: No container available. Did not bring bottle(s) to appointment. Filled Date: N/A Last Medication intake:  Yesterday

## 2018-02-16 ENCOUNTER — Ambulatory Visit: Payer: Medicare Other | Admitting: Primary Care

## 2018-02-17 ENCOUNTER — Telehealth: Payer: Self-pay

## 2018-02-17 ENCOUNTER — Encounter: Payer: Self-pay | Admitting: Primary Care

## 2018-02-17 ENCOUNTER — Ambulatory Visit (INDEPENDENT_AMBULATORY_CARE_PROVIDER_SITE_OTHER): Payer: Medicare Other | Admitting: Primary Care

## 2018-02-17 VITALS — BP 128/72 | HR 85 | Temp 98.0°F | Wt 241.5 lb

## 2018-02-17 DIAGNOSIS — B354 Tinea corporis: Secondary | ICD-10-CM | POA: Diagnosis not present

## 2018-02-17 DIAGNOSIS — E119 Type 2 diabetes mellitus without complications: Secondary | ICD-10-CM | POA: Diagnosis not present

## 2018-02-17 LAB — HEMOGLOBIN A1C: HEMOGLOBIN A1C: 7.4 % — AB (ref 4.6–6.5)

## 2018-02-17 NOTE — Assessment & Plan Note (Signed)
Repeat A1C pending. Discussed the importance of a healthy diet and regular exercise in order for weight loss, and to reduce the risk of any potential medical problems.  Would recommend he start Lantus HS if A1C above goal of 8.0.

## 2018-02-17 NOTE — Telephone Encounter (Signed)
left message for patient to call Anastasiya back in regards to a referral-Anastasiya V Hopkins, RMA 

## 2018-02-17 NOTE — Patient Instructions (Addendum)
You will be contacted regarding your referral to dermatology.  Please let us know if you have not been contacted within one week.   Stop by the lab prior to leaving today. I will notify you of your results once received.   Please schedule a follow up appointment in 6 months.  I will notify you if you should be seen sooner for diabetes follow up.  It was a pleasure to see you today!

## 2018-02-17 NOTE — Assessment & Plan Note (Signed)
Improved without resolve. Referral placed to dermatology for further evaluation.

## 2018-02-17 NOTE — Progress Notes (Addendum)
Subjective:    Patient ID: Joseph [name], male    DOB: 1971-05-09, 47 y.o.   MRN: [id number]  HPI  Mr. Carrigan is a 47 year old male who presents today for follow up.  1) Type 2 Diabetes:  Current medications include: Metformin XR 1000 mg, Glipizide 5 mg BID, Jardiance 10 mg.   He does not check his glucose readings.   Last A1C: 8.3 in December 2018 Last Eye Exam: Completed in December 2018 Last Foot Exam: Completed in September 2018 Pneumonia Vaccination: Due ACE/ARB: Urine microalbumin negative in June 2018 Statin: Atorvastatin  Diet currently consists of:  Breakfast: Fast food  Lunch: Sometimes skips, fast food Dinner: Meat, vegetables, starch Snacks: None Desserts: None Beverages: Cherry Coke, coffee, no water  Exercise: He is more active as he started working a new job.   Wt Readings from Last 3 Encounters:  02/17/18 241 lb 8 oz (109.5 kg)  02/10/18 235 lb (106.6 kg)  02/09/18 240 lb (108.9 kg)     2) Cough: Also with congestion, fatigue. Symptoms began 4 days ago. He's using his albuterol inhaler 1-2 times daily intermittently. His cough is non productive. He denies fevers.   3) Rash: Diagnosed with Tinea Corporis, treated with both topical and oral agents at various times with improvement but without resolve. He's also noticed a cyst to his mid anterior chest for years which will increase and decrease in size. He would like to see a dermatology to discuss removal of the cyst. He's attempted to "pop" the cyst several times with expulsion of clear, "smelly" liquid. He denies fevers.    Review of Systems  Constitutional: Negative for fever.  HENT: Positive for congestion and postnasal drip. Negative for ear pain, sinus pressure and sore throat.   Eyes: Negative for visual disturbance.  Respiratory: Negative for shortness of breath.   Cardiovascular: Negative for chest pain.  Musculoskeletal:       Chronic left knee pain  Neurological: Negative for dizziness.         Past Medical History:  Diagnosis Date  . Anxiety   . Arthritis   . Asthma   . Chronic back pain   . Chronic pain of left knee   . COPD (chronic obstructive pulmonary disease) (HCC)   . Diabetes mellitus   . GERD (gastroesophageal reflux disease)   . INGUINAL PAIN, LEFT 10/03/2009   Qualifier: Diagnosis of  By: Darrick Penna MD, Sandi L   . Shortness of breath   . Sleep apnea      Social History   Socioeconomic History  . Marital status: Legally Separated    Spouse name: Not on file  . Number of children: Not on file  . Years of education: Not on file  . Highest education level: Not on file  Social Needs  . Financial resource strain: Not on file  . Food insecurity - worry: Not on file  . Food insecurity - inability: Not on file  . Transportation needs - medical: Not on file  . Transportation needs - non-medical: Not on file  Occupational History  . Occupation: disability  Tobacco Use  . Smoking status: Current Every Day Smoker    Packs/day: 1.50    Years: 20.00    Pack years: 30.00    Types: Cigarettes    Start date: 12/17/1993  . Smokeless tobacco: Former Neurosurgeon  . Tobacco comment: 1ppd as of 11/06/17 ep  Substance and Sexual Activity  . Alcohol use: No  Alcohol/week: 0.0 oz  . Drug use: No  . Sexual activity: Yes    Birth control/protection: Surgical  Other Topics Concern  . Not on file  Social History Narrative   Recent visit to [facility] in [place] d/t increased pain where he was prescribed percocet 7.5-325 mg for pain.    Past Surgical History:  Procedure Laterality Date  . CARPAL TUNNEL RELEASE Left   . CATARACT EXTRACTION W/PHACO Right 03/11/2016   Procedure: CATARACT EXTRACTION PHACO AND INTRAOCULAR LENS PLACEMENT (IOC);  Surgeon: Gemma Payor, MD;  Location: [facility];  Service: Ophthalmology;  Laterality: Right;  CDE 4.24  . HERNIA REPAIR Bilateral   . KNEE ARTHROSCOPY WITH MEDIAL MENISECTOMY Left 04/22/2014   Procedure: LEFT KNEE ARTHROSCOPY WITH MEDIAL  MENISECTOMY;  Surgeon: Vickki Hearing, MD;  Location: [facility];  Service: Orthopedics;  Laterality: Left;  . KNEE ARTHROSCOPY WITH MEDIAL MENISECTOMY Right 12/06/2015   Procedure: RIGHT KNEE ARTHROSCOPY WITH MEDIAL MENISECTOMY;  Surgeon: Vickki Hearing, MD;  Location: [facility];  Service: Orthopedics;  Laterality: Right;  . KNEE ARTHROSCOPY WITH MEDIAL MENISECTOMY Left 03/20/2017   Procedure: KNEE ARTHROSCOPY WITH MEDIAL MENISECTOMY;  Surgeon: Vickki Hearing, MD;  Location: [facility];  Service: Orthopedics;  Laterality: Left;  . ORIF WRIST FRACTURE Left 12/19/2016   Procedure: OPEN REDUCTION INTERNAL FIXATION (ORIF) WRIST FRACTURE;  Surgeon: Betha Loa, MD;  Location: [facility];  Service: Orthopedics;  Laterality: Left;  accumed   . SHOULDER ARTHROCENTESIS Right   . VASECTOMY      Family History  Problem Relation Age of Onset  . Emphysema Maternal Grandfather   . Emphysema Maternal Grandmother   . Clotting disorder Maternal Grandmother     Allergies  Allergen Reactions  . Azithromycin Hives  . Erythromycin Hives  . Keflex [Cephalexin] Nausea Only  . Metformin Nausea And Vomiting  . Symbicort [Budesonide-Formoterol Fumarate] Other (See Comments)    States that 3 doses were used and breathing became worse    Current Outpatient Medications on File Prior to Visit  Medication Sig Dispense Refill  . albuterol (PROVENTIL) (2.5 MG/3ML) 0.083% nebulizer solution USE 1 VIAL VIA NEBULIZER FOUR TIMES DAILY AS NEEDED FOR SHORTNESS OF BREATH 360 mL 0  . atorvastatin (LIPITOR) 20 MG tablet Take 1 tablet (20 mg total) by mouth every evening. 90 tablet 3  . buPROPion (WELLBUTRIN SR) 150 MG 12 hr tablet Take 1 tablet (150 mg total) by mouth 2 (two) times daily. 180 tablet 1  . celecoxib (CELEBREX) 100 MG capsule Take 100 mg by mouth 2 (two) times daily.    . ciclopirox (LOPROX) 0.77 % cream Apply topically 2 (two) times daily. 30 g 0  . CINNAMON PO Take 2,800 mg by mouth daily. EACH  TABLET IS 1400 MG.  TAKES 2 CAPSULES DAILY    . citalopram (CELEXA) 40 MG tablet TAKE ONE (1) TABLET BY MOUTH EVERY DAY 30 tablet 11  . Continuous Blood Gluc Receiver (FREESTYLE LIBRE 14 DAY READER) DEVI 1 Device by Does not apply route 3 (three) times daily. 1 Device 0  . Continuous Blood Gluc Sensor (FREESTYLE LIBRE 14 DAY SENSOR) MISC 1 Device by Does not apply route 3 (three) times daily. 1 each 0  . diphenhydrAMINE (BENADRYL) 25 MG tablet Take 25 mg by mouth 2 (two) times daily.    . empagliflozin (JARDIANCE) 10 MG TABS tablet Take 10 mg by mouth every morning. 90 tablet 2  . fluconazole (DIFLUCAN) 150 MG tablet Take 2 tablets now,  repeat in one week. 4 tablet 0  . fluticasone furoate-vilanterol (BREO ELLIPTA) 200-25 MCG/INH AEPB Inhale 1 puff into the lungs daily. 30 each 11  . glipiZIDE (GLUCOTROL) 5 MG tablet TAKE TWO TABLETS BY MOUTH TWICE DAILY 360 tablet 1  . ipratropium (ATROVENT) 0.02 % nebulizer solution USE 1 VIAL IN NEBULIZER FOUR TIMES DAILY AS NEEDED 300 mL 0  . Ipratropium-Albuterol (COMBIVENT) 20-100 MCG/ACT AERS respimat Inhale 1 puff into the lungs every 6 (six) hours as needed for wheezing. 1 Inhaler 2  . lansoprazole (PREVACID) 30 MG capsule TAKE ONE CAPSULE BY MOUTH TWICE A DAY BEFORE A MEAL 180 capsule 1  . lubiprostone (AMITIZA) 24 MCG capsule Take 1 capsule (24 mcg total) by mouth 2 (two) times daily. 60 capsule 2  . metFORMIN (GLUCOPHAGE-XR) 500 MG 24 hr tablet TAKE 1 TABLET DAILY WITH BREAKFAST FOR 2WEEKS, THEN ADVANCE TO 2 TABLETS WITH BREAKFAST THEREAFTER 180 tablet 1  . Multiple Vitamin (MULTIVITAMIN WITH MINERALS) TABS Take 1 tablet by mouth every morning. Men's once daily multivitamin packet (vitamin e, calcium, ginseng)    . Saw Palmetto, Serenoa repens, (SAW PALMETTO PO) Take 1 capsule by mouth daily.    . tamsulosin (FLOMAX) 0.4 MG CAPS capsule TAKE ONE (1) CAPSULE EACH DAY 90 capsule 1  . tapentadol (NUCYNTA) 50 MG tablet Take 1 tablet (50 mg total) by mouth  every 8 (eight) hours as needed for severe pain. Do not take if you have epilepsy or a history of seizures. Swallow tablets whole. Do not chew, crush or dissolve. 90 tablet 0  . testosterone (ANDROGEL) 50 MG/5GM (1%) GEL Place 5 g onto the skin daily.    . traZODone (DESYREL) 150 MG tablet Take 1 tablet (150 mg total) by mouth at bedtime. 90 tablet 1  . triamcinolone cream (KENALOG) 0.1 % Apply 1 application topically 2 (two) times daily. 45 g 0   No current facility-administered medications on file prior to visit.     BP 128/72   Pulse 85   Temp 98 F (36.7 C) (Oral)   Wt 241 lb 8 oz (109.5 kg)   SpO2 97%   BMI 34.65 kg/m    Objective:   Physical Exam  Constitutional: He appears well-nourished.  HENT:  Right Ear: Tympanic membrane and ear canal normal.  Left Ear: Tympanic membrane and ear canal normal.  Nose: No mucosal edema. Right sinus exhibits no maxillary sinus tenderness and no frontal sinus tenderness. Left sinus exhibits no maxillary sinus tenderness and no frontal sinus tenderness.  Mouth/Throat: Oropharynx is clear and moist.  Eyes: Conjunctivae are normal.  Neck: Neck supple.  Cardiovascular: Normal rate and regular rhythm.  Pulmonary/Chest: Effort normal and breath sounds normal. He has no wheezes. He has no rales.  Skin: Skin is warm and dry.     Cyst like mass to mid anterior chest. Mild erythema, he has been pushing on it this morning. No signs of infection or surrounding cellulitis. Non tender.          Assessment & Plan:  Allergic Rhinitis:  Post nasal drip, congestion, cough x 4 days. Exam today with clear lungs, doesn't appear ill.  Suspect allergy with potential early viral involvement. Discussed conservative treatment. He will update if symptoms persist.  Doreene NestKatherine K Bearett Porcaro, NP

## 2018-02-19 ENCOUNTER — Encounter: Payer: Self-pay | Admitting: Primary Care

## 2018-02-19 DIAGNOSIS — J069 Acute upper respiratory infection, unspecified: Secondary | ICD-10-CM

## 2018-02-20 MED ORDER — AMOXICILLIN 875 MG PO TABS: 875.0000 mg | ORAL_TABLET | Freq: Two times a day (BID) | ORAL | 0 refills | Status: DC

## 2018-02-25 ENCOUNTER — Other Ambulatory Visit: Payer: Self-pay | Admitting: Primary Care

## 2018-03-04 ENCOUNTER — Telehealth: Payer: Self-pay | Admitting: *Deleted

## 2018-03-04 NOTE — Telephone Encounter (Signed)
Voicemail left with patient that this would need to be discussed at next appt time.  If he feels that there is additional information that I am not seeing re; scheduling this to please call back.  Patient has appt on 03/10/18 with Thad Rangerrystal King, reminded of this on voicemail that he could discuss with her at that time.

## 2018-03-05 DIAGNOSIS — L920 Granuloma annulare: Secondary | ICD-10-CM | POA: Diagnosis not present

## 2018-03-05 DIAGNOSIS — L720 Epidermal cyst: Secondary | ICD-10-CM | POA: Diagnosis not present

## 2018-03-10 ENCOUNTER — Encounter: Payer: Self-pay | Admitting: Nurse Practitioner

## 2018-03-10 ENCOUNTER — Ambulatory Visit: Payer: Medicare Other | Attending: Nurse Practitioner | Admitting: Nurse Practitioner

## 2018-03-10 ENCOUNTER — Other Ambulatory Visit: Payer: Self-pay

## 2018-03-10 VITALS — BP 137/84 | HR 81 | Temp 98.4°F | Resp 16 | Ht 70.0 in | Wt 234.0 lb

## 2018-03-10 DIAGNOSIS — K5903 Drug induced constipation: Secondary | ICD-10-CM | POA: Diagnosis not present

## 2018-03-10 DIAGNOSIS — Z9889 Other specified postprocedural states: Secondary | ICD-10-CM | POA: Insufficient documentation

## 2018-03-10 DIAGNOSIS — F17210 Nicotine dependence, cigarettes, uncomplicated: Secondary | ICD-10-CM | POA: Diagnosis not present

## 2018-03-10 DIAGNOSIS — M1712 Unilateral primary osteoarthritis, left knee: Secondary | ICD-10-CM

## 2018-03-10 DIAGNOSIS — K5909 Other constipation: Secondary | ICD-10-CM | POA: Diagnosis not present

## 2018-03-10 DIAGNOSIS — Z881 Allergy status to other antibiotic agents status: Secondary | ICD-10-CM | POA: Diagnosis not present

## 2018-03-10 DIAGNOSIS — G8929 Other chronic pain: Secondary | ICD-10-CM

## 2018-03-10 DIAGNOSIS — M25561 Pain in right knee: Secondary | ICD-10-CM | POA: Diagnosis not present

## 2018-03-10 DIAGNOSIS — F419 Anxiety disorder, unspecified: Secondary | ICD-10-CM | POA: Diagnosis not present

## 2018-03-10 DIAGNOSIS — Z79891 Long term (current) use of opiate analgesic: Secondary | ICD-10-CM | POA: Diagnosis not present

## 2018-03-10 DIAGNOSIS — T402X5A Adverse effect of other opioids, initial encounter: Secondary | ICD-10-CM | POA: Diagnosis not present

## 2018-03-10 DIAGNOSIS — E119 Type 2 diabetes mellitus without complications: Secondary | ICD-10-CM | POA: Insufficient documentation

## 2018-03-10 DIAGNOSIS — Z5181 Encounter for therapeutic drug level monitoring: Secondary | ICD-10-CM | POA: Diagnosis not present

## 2018-03-10 DIAGNOSIS — K219 Gastro-esophageal reflux disease without esophagitis: Secondary | ICD-10-CM | POA: Diagnosis not present

## 2018-03-10 DIAGNOSIS — J449 Chronic obstructive pulmonary disease, unspecified: Secondary | ICD-10-CM | POA: Diagnosis not present

## 2018-03-10 DIAGNOSIS — G894 Chronic pain syndrome: Secondary | ICD-10-CM | POA: Diagnosis not present

## 2018-03-10 DIAGNOSIS — M25562 Pain in left knee: Secondary | ICD-10-CM | POA: Diagnosis not present

## 2018-03-10 MED ORDER — NALOXEGOL OXALATE 25 MG PO TABS: 25.0000 mg | ORAL_TABLET | Freq: Every day | ORAL | 0 refills | Status: AC

## 2018-03-10 MED ORDER — TAPENTADOL HCL 50 MG PO TABS: 50.0000 mg | ORAL_TABLET | Freq: Three times a day (TID) | ORAL | 0 refills | Status: DC | PRN

## 2018-03-10 MED ORDER — TAPENTADOL HCL ER 50 MG PO TB12: 50.0000 mg | ORAL_TABLET | Freq: Three times a day (TID) | ORAL | 0 refills | Status: DC | PRN

## 2018-03-10 NOTE — Patient Instructions (Addendum)
____________________________________________________________________________________________  Medication Rules  Applies to: All patients receiving prescriptions (written or electronic).  Pharmacy of record: Pharmacy where electronic prescriptions will be sent. If written prescriptions are taken to a different pharmacy, please inform the nursing staff. The pharmacy listed in the electronic medical record should be the one where you would like electronic prescriptions to be sent.  Prescription refills: Only during scheduled appointments. Applies to both, written and electronic prescriptions.  NOTE: The following applies primarily to controlled substances (Opioid* Pain Medications).   Patient's responsibilities: 1. Pain Pills: Bring all pain pills to every appointment (except for procedure appointments). 2. Pill Bottles: Bring pills in original pharmacy bottle. Always bring newest bottle. Bring bottle, even if empty. 3. Medication refills: You are responsible for knowing and keeping track of what medications you need refilled. The day before your appointment, write a list of all prescriptions that need to be refilled. Bring that list to your appointment and give it to the admitting nurse. Prescriptions will be written only during appointments. If you forget a medication, it will not be "Called in", "Faxed", or "electronically sent". You will need to get another appointment to get these prescribed. 4. Prescription Accuracy: You are responsible for carefully inspecting your prescriptions before leaving our office. Have the discharge nurse carefully go over each prescription with you, before taking them home. Make sure that your name is accurately spelled, that your address is correct. Check the name and dose of your medication to make sure it is accurate. Check the number of pills, and the written instructions to make sure they are clear and accurate. Make sure that you are given enough medication to last  until your next medication refill appointment. 5. Taking Medication: Take medication as prescribed. Never take more pills than instructed. Never take medication more frequently than prescribed. Taking less pills or less frequently is permitted and encouraged, when it comes to controlled substances (written prescriptions).  6. Inform other Doctors: Always inform, all of your healthcare providers, of all the medications you take. 7. Pain Medication from other Providers: You are not allowed to accept any additional pain medication from any other Doctor or Healthcare provider. There are two exceptions to this rule. (see below) In the event that you require additional pain medication, you are responsible for notifying us, as stated below. 8. Medication Agreement: You are responsible for carefully reading and following our Medication Agreement. This must be signed before receiving any prescriptions from our practice. Safely store a copy of your signed Agreement. Violations to the Agreement will result in no further prescriptions. (Additional copies of our Medication Agreement are available upon request.) 9. Laws, Rules, & Regulations: All patients are expected to follow all Federal and State Laws, Statutes, Rules, & Regulations. Ignorance of the Laws does not constitute a valid excuse. The use of any illegal substances is prohibited. 10. Adopted CDC guidelines & recommendations: Target dosing levels will be at or below 60 MME/day. Use of benzodiazepines** is not recommended.  Exceptions: There are only two exceptions to the rule of not receiving pain medications from other Healthcare Providers. 1. Exception #1 (Emergencies): In the event of an emergency (i.e.: accident requiring emergency care), you are allowed to receive additional pain medication. However, you are responsible for: As soon as you are able, call our office [phone], at any time of the day or night, and leave a message stating your name, the  date and nature of the emergency, and the name and dose of the medication   prescribed. In the event that your call is answered by a member of our staff, make sure to document and save the date, time, and the name of the person that took your information.  2. Exception #2 (Planned Surgery): In the event that you are scheduled by another doctor or dentist to have any type of surgery or procedure, you are allowed (for a period no longer than 30 days), to receive additional pain medication, for the acute post-op pain. However, in this case, you are responsible for picking up a copy of our "Post-op Pain Management for Surgeons" handout, and giving it to your surgeon or dentist. This document is available at our office, and does not require an appointment to obtain it. Simply go to our office during business hours (Monday-Thursday from 8:00 AM to 4:00 PM) (Friday 8:00 AM to 12:00 Noon) or if you have a scheduled appointment with us, prior to your surgery, and ask for it by name. In addition, you will need to provide us with your name, name of your surgeon, type of surgery, and date of procedure or surgery.  *Opioid medications include: morphine, codeine, oxycodone, oxymorphone, hydrocodone, hydromorphone, meperidine, tramadol, tapentadol, buprenorphine, fentanyl, methadone. **Benzodiazepine medications include: diazepam (Valium), alprazolam (Xanax), clonazepam (Klonopine), lorazepam (Ativan), clorazepate (Tranxene), chlordiazepoxide (Librium), estazolam (Prosom), oxazepam (Serax), temazepam (Restoril), triazolam (Halcion) (Last updated: 01/29/2018) ____________________________________________________________________________________________   Radiofrequency Lesioning Radiofrequency lesioning is a procedure that is performed to relieve pain. The procedure is often used for back, neck, or arm pain. Radiofrequency lesioning involves the use of a machine that creates radio waves to make heat. During the procedure, the  heat is applied to the nerve that carries the pain signal. The heat damages the nerve and interferes with the pain signal. Pain relief usually starts about 2 weeks after the procedure and lasts for 6 months to 1 year. Tell a health care provider about:  Any allergies you have.  All medicines you are taking, including vitamins, herbs, eye drops, creams, and over-the-counter medicines.  Any problems you or family members have had with anesthetic medicines.  Any blood disorders you have.  Any surgeries you have had.  Any medical conditions you have.  Whether you are pregnant or may be pregnant. What are the risks? Generally, this is a safe procedure. However, problems may occur, including:  Pain or soreness at the injection site.  Infection at the injection site.  Damage to nerves or blood vessels.  What happens before the procedure?  Ask your health care provider about: ? Changing or stopping your regular medicines. This is especially important if you are taking diabetes medicines or blood thinners. ? Taking medicines such as aspirin and ibuprofen. These medicines can thin your blood. Do not take these medicines before your procedure if your health care provider instructs you not to.  Follow instructions from your health care provider about eating or drinking restrictions.  Plan to have someone take you home after the procedure.  If you go home right after the procedure, plan to have someone with you for 24 hours. What happens during the procedure?  You will be given one or more of the following: ? A medicine to help you relax (sedative). ? A medicine to numb the area (local anesthetic).  You will be awake during the procedure. You will need to be able to talk with the health care provider during the procedure.  With the help of a type of X-ray (fluoroscopy), the health care provider will insert a radiofrequency   needle into the area to be treated.  Next, a wire that  carries the radio waves (electrode) will be put through the radiofrequency needle. An electrical pulse will be sent through the electrode to verify the correct nerve. You will feel a tingling sensation, and you may have muscle twitching.  Then, the tissue that is around the needle tip will be heated by an electric current that is passed using the radiofrequency machine. This will numb the nerves.  A bandage (dressing) will be put on the insertion area after the procedure is done. The procedure may vary among health care providers and hospitals. What happens after the procedure?  Your blood pressure, heart rate, breathing rate, and blood oxygen level will be monitored often until the medicines you were given have worn off.  Return to your normal activities as directed by your health care provider. This information is not intended to replace advice given to you by your health care provider. Make sure you discuss any questions you have with your health care provider. Document Released: 07/17/2011 Document Revised: 04/25/2016 Document Reviewed: 12/26/2014 [employer] Interactive Patient Education  2018 [employer]  

## 2018-03-10 NOTE — Progress Notes (Signed)
Patient's Name: Joseph [name]  MRN: [id number]  Referring Provider: Pleas Koch, NP  DOB: 09-Oct-1971  PCP: Pleas Koch, NP  DOS: 03/10/2018  Note by: Vevelyn Francois NP  Service setting: Ambulatory outpatient  Specialty: Interventional Pain Management  Location: [facility] (AMB) Pain Management Facility    Patient type: Established    Primary Reason(s) for Visit: Encounter for prescription drug management. (Level of risk: moderate)  CC: Knee Pain (left)  HPI  Joseph [name] is a 47 y.o. year old, male patient, who comes today for a medication management evaluation. He has Mediastinal abnormality; Cigarette smoker; Chronic pain syndrome; Diabetes ([place]); Intrinsic asthma; Medial meniscus, posterior horn derangement; S/P knee surgery; Acute meniscal tear of left knee; Testosterone deficiency; OSA (obstructive sleep apnea); Morbid obesity ([place]); Anxiety and depression; Bucket handle tear of meniscus of right knee; Skin lesions; GERD (gastroesophageal reflux disease); Osteoarthritis of the knee (Left); Post-void dribbling; COPD without exacerbation ([place]); Hyperlipidemia; Chronic constipation; Disorder of skeletal system; Chronic knee pain (Primary Area of Pain) (Bilateral) (L>R); Chronic wrist pain (Secondary Area of Pain) (Left); Insomnia; Male hypogonadism; Pharmacologic therapy; Problems influencing health [name]; Long term prescription opiate use; Opiate use; Tinea corporis; Tinea versicolor; Arthropathy of left knee; and Opioid-induced constipation (OIC) on their problem list. His primarily concern today is the Knee Pain (left)  Pain Assessment: Location: Left Knee Radiating: denies Onset: More than a month ago Duration: Chronic pain Quality: Aching, Discomfort Severity: 2 /10 (self-reported pain score)  Note: Reported level is compatible with observation.                          Timing: Constant Modifying factors:    Joseph [name] for an appointment on 02/10/2018 for  medication management. During today's appointment we reviewed Joseph [name], as well as his outpatient medication regimen. He continues to have left knee pain. He is [name] post 2 genicular nerve blocks. He did receive greater than 50% relief for long periods with nerve block #2. 100%percent short-term relief with nerve block #1.He along with his surgeon have agreed to try the genicular nerve radiofrequency ablation the possibility to prolong the need for left total knee replacement.  The patient  reports that he does not use drugs. His body mass index is 33.58 kg/m.  Further details on both, my assessment(s), as well as the proposed treatment plan, please see below.  Controlled Substance Pharmacotherapy Assessment REMS (Risk Evaluation and Mitigation Strategy)  Analgesic:Nucynta 50 mg 3 times daily(150 mg/dayof Nucynta) MME/day:86m/day   TDewayne Shorter RN  03/10/2018 11:05 AM  Signed Nursing Pain Medication Assessment:  Safety precautions to be maintained throughout the outpatient stay will include: orient to surroundings, keep bed in low position, maintain call bell within reach at all times, provide assistance with transfer out of bed and ambulation.  Medication Inspection Compliance: Pill count conducted under aseptic conditions, in front of the patient. Neither the pills nor the bottle was removed from the patient's sight at any time. Once count was completed pills were immediately returned to the patient in their original bottle.  Medication: Tapentadol (Nucynta) Pill/Patch Count: 14 of 90 pills remain Pill/Patch Appearance: Markings consistent with prescribed medication Bottle Appearance: Standard pharmacy container. Clearly labeled. Filled Date: 03 / 13 / 2019 Last Medication intake:  Today   Pharmacokinetics: Liberation and absorption (onset of action): WNL Distribution (time to peak effect): WNL Metabolism and excretion (duration of action): WNL  Pharmacodynamics: Desired effects: Analgesia: Joseph [name] reports >30% benefit. Functional ability: Patient reports that medication allows him to accomplish basic ADLs Clinically meaningful improvement in function (CMIF): Sustained CMIF goals met Perceived effectiveness: Described as relatively effective, allowing for increase in activities of daily living (ADL) Undesirable effects: Side-effects or Adverse reactions: None reported Monitoring: [place] PMP: Online review of the past 78-monthperiod conducted. Compliant with practice rules and regulations Last UDS on record: Summary  Date Value Ref Range [name]  11/05/2017 FINAL  Final    Comment:    ==================================================================== TOXASSURE COMP DRUG ANALYSIS,UR ==================================================================== Test                             Result       Flag       Units Drug Present and Declared for Prescription Verification   Tapentadol                     11414        EXPECTED   ng/mg creat    Source of tapentadol is a [name] prescription medication.   Bupropion                      PRESENT      EXPECTED   Hydroxybupropion               PRESENT      EXPECTED    Hydroxybupropion is an expected metabolite of bupropion.   Citalopram                     PRESENT      EXPECTED   Desmethylcitalopram            PRESENT      EXPECTED    Desmethylcitalopram is an expected metabolite of citalopram or    the enantiomeric form, escitalopram.   Trazodone                      PRESENT      EXPECTED   1,3 chlorophenyl piperazine    PRESENT      EXPECTED    1,3-chlorophenyl piperazine is an expected metabolite of    trazodone.   Diphenhydramine                PRESENT      EXPECTED Drug Absent but Declared for Prescription Verification   Alprazolam                     Not Detected UNEXPECTED ng/mg creat ==================================================================== Test                       Result    Flag   Units      Ref Range   Creatinine              66               mg/dL      >=20 ==================================================================== Declared Medications:  The flagging and interpretation on this report are based on the  following declared medications.  Unexpected results may arise from  inaccuracies in the declared medications.  **Note: The testing scope of this panel includes these medications:  Alprazolam (Xanax)  Bupropion (Wellbutrin)  Citalopram  Diphenhydramine (Benadryl)  Tapentadol  Trazodone  **Note: The testing scope of this panel does not include following  reported  medications:  Albuterol (Combivent)  Albuterol (Proventil)  Atorvastatin (Lipitor)  Cinnamon Bark  Empagliflozin (Jardiance)  Fluticasone (Breo)  Glipizide  Ipratropium  Ipratropium (Combivent)  Lansoprazole (Prevacid)  Lubiprostone (Amitiza)  Metformin (Glucophage)  Multivitamin  Nabumetone (Relafen)  Sildenafil  Supplement (Saw Palmetto)  Tamsulosin  Testosterone  Triamcinolone  Vilanterol (Breo) ==================================================================== For clinical consultation, please call [phone]. ====================================================================    UDS interpretation: Compliant          Medication Assessment Form: Reviewed. Patient indicates being compliant with therapy Treatment compliance: Compliant Risk Assessment Profile: Aberrant behavior: See prior evaluations. None observed or detected today Comorbid factors increasing risk of overdose: See prior notes. No additional risks detected today Risk of substance use disorder (SUD): Low Opioid Risk Tool - 02/10/18 1118      Family History of Substance Abuse   Alcohol  Negative    Illegal Drugs  Negative    Rx Drugs  Negative      Personal History of Substance Abuse   Alcohol  Negative    Illegal Drugs  Negative    Rx Drugs  Negative      Total Score    Opioid Risk Tool Scoring  0    Opioid Risk Interpretation  Low Risk      ORT Scoring interpretation table:  Score <3 = Low Risk for SUD  Score between 4-7 = Moderate Risk for SUD  Score >8 = High Risk for Opioid Abuse   Risk Mitigation Strategies:  Patient Counseling: Covered Patient-Prescriber Agreement (PPA): Present and active  Notification to other healthcare providers: Done  Pharmacologic Plan: No change in therapy, at this time.             Laboratory Chemistry  Inflammation Markers (CRP: Acute Phase) (ESR: Chronic Phase) Lab Results  Component Value Date   CRP 0.7 11/05/2017   ESRSEDRATE 11 11/05/2017   LATICACIDVEN 2.1 01/24/2013                         Rheumatology Markers No results found for: Elayne Guerin, [facility]                      Renal Function Markers Lab Results  Component Value Date   BUN 10 11/05/2017   CREATININE 1.03 11/05/2017   GFRAA 100 11/05/2017   GFRNONAA 86 11/05/2017                              Hepatic Function Markers Lab Results  Component Value Date   AST 13 11/05/2017   ALT 19 05/08/2017   ALBUMIN 4.8 11/05/2017   ALKPHOS 100 11/05/2017   LIPASE 38 01/24/2013                        Electrolytes Lab Results  Component Value Date   NA 138 11/05/2017   K 4.6 11/05/2017   CL 97 11/05/2017   CALCIUM 9.6 11/05/2017   MG 2.1 11/05/2017                        Neuropathy Markers Lab Results  Component Value Date   VITAMINB12 341 11/05/2017   HGBA1C 7.4 (H) 02/17/2018                        Bone Pathology Markers Lab Results  Component Value Date   25OHVITD1 42 11/05/2017   25OHVITD2 <1.0 11/05/2017   25OHVITD3 42 11/05/2017   TESTOSTERONE 208.98 (L) 04/17/2017                         Coagulation Parameters Lab Results  Component Value Date   PLT 214 03/17/2017   DDIMER 0.46 03/07/2012                        Cardiovascular Markers Lab Results  Component Value Date   TROPONINI  <0.30 12/09/2013   HGB 14.0 03/17/2017   HCT 41.1 03/17/2017                         CA Markers No results found for: CEA, CA125, LABCA2                      Note: Lab results reviewed.  Recent Diagnostic Imaging Results  DG C-Arm 1-60 Min-No Report Fluoroscopy was utilized by the requesting physician.  No radiographic  interpretation.   Complexity Note: Imaging results reviewed. Results shared with Mr. Wakeley, using Layman's terms.                         Meds   Current Outpatient Medications:  .  albuterol (PROVENTIL) (2.5 MG/3ML) 0.083% nebulizer solution, USE 1 VIAL VIA NEBULIZER FOUR TIMES DAILY AS NEEDED FOR SHORTNESS OF BREATH, Disp: 360 mL, Rfl: 0 .  ANDROGEL PUMP 20.25 MG/ACT (1.62%) GEL, , Disp: , Rfl:  .  atorvastatin (LIPITOR) 20 MG tablet, Take 1 tablet (20 mg total) by mouth every evening., Disp: 90 tablet, Rfl: 3 .  betamethasone dipropionate (DIPROLENE) 0.05 % cream, , Disp: , Rfl:  .  buPROPion (WELLBUTRIN SR) 150 MG 12 hr tablet, TAKE ONE TABLET BY MOUTH TWICE A DAY, Disp: 180 tablet, Rfl: 1 .  celecoxib (CELEBREX) 100 MG capsule, Take 100 mg by mouth 2 (two) times daily., Disp: , Rfl:  .  ciclopirox (LOPROX) 0.77 % cream, Apply topically 2 (two) times daily., Disp: 30 g, Rfl: 0 .  CINNAMON PO, Take 2,800 mg by mouth daily. EACH TABLET IS 1400 MG.  TAKES 2 CAPSULES DAILY, Disp: , Rfl:  .  citalopram (CELEXA) 40 MG tablet, TAKE ONE (1) TABLET BY MOUTH EVERY DAY, Disp: 30 tablet, Rfl: 11 .  Continuous Blood Gluc Receiver (FREESTYLE LIBRE 14 DAY READER) DEVI, 1 Device by Does not apply route 3 (three) times daily., Disp: 1 Device, Rfl: 0 .  Continuous Blood Gluc Sensor (FREESTYLE LIBRE 14 DAY SENSOR) MISC, 1 Device by Does not apply route 3 (three) times daily., Disp: 1 each, Rfl: 0 .  diphenhydrAMINE (BENADRYL) 25 MG tablet, Take 25 mg by mouth 2 (two) times daily., Disp: , Rfl:  .  doxycycline (VIBRA-TABS) 100 MG tablet, , Disp: , Rfl:  .  empagliflozin (JARDIANCE)  10 MG TABS tablet, Take 10 mg by mouth every morning., Disp: 90 tablet, Rfl: 2 .  fluconazole (DIFLUCAN) 150 MG tablet, Take 2 tablets now, repeat in one week., Disp: 4 tablet, Rfl: 0 .  fluticasone furoate-vilanterol (BREO ELLIPTA) 200-25 MCG/INH AEPB, Inhale 1 puff into the lungs daily., Disp: 30 each, Rfl: 11 .  glipiZIDE (GLUCOTROL) 5 MG tablet, TAKE TWO TABLETS BY MOUTH TWICE DAILY, Disp: 360 tablet, Rfl: 1 .  ipratropium (ATROVENT) 0.02 % nebulizer solution, USE 1  VIAL IN NEBULIZER FOUR TIMES DAILY AS NEEDED, Disp: 300 mL, Rfl: 0 .  Ipratropium-Albuterol (COMBIVENT) 20-100 MCG/ACT AERS respimat, Inhale 1 puff into the lungs every 6 (six) hours as needed for wheezing., Disp: 1 Inhaler, Rfl: 2 .  lansoprazole (PREVACID) 30 MG capsule, TAKE ONE CAPSULE BY MOUTH TWICE A DAY BEFORE A MEAL, Disp: 180 capsule, Rfl: 1 .  lubiprostone (AMITIZA) 24 MCG capsule, Take 1 capsule (24 mcg total) by mouth 2 (two) times daily., Disp: 60 capsule, Rfl: 2 .  metFORMIN (GLUCOPHAGE-XR) 500 MG 24 hr tablet, TAKE 1 TABLET DAILY WITH BREAKFAST FOR 2WEEKS, THEN ADVANCE TO 2 TABLETS WITH BREAKFAST THEREAFTER, Disp: 180 tablet, Rfl: 1 .  Multiple Vitamin (MULTIVITAMIN WITH MINERALS) TABS, Take 1 tablet by mouth every morning. Men's once daily multivitamin packet (vitamin e, calcium, ginseng), Disp: , Rfl:  .  Saw Palmetto, Serenoa repens, (SAW PALMETTO PO), Take 1 capsule by mouth daily., Disp: , Rfl:  .  tamsulosin (FLOMAX) 0.4 MG CAPS capsule, TAKE ONE (1) CAPSULE EACH DAY, Disp: 90 capsule, Rfl: 1 .  [START ON 03/13/2018] tapentadol (NUCYNTA) 50 MG tablet, Take 1 tablet (50 mg total) by mouth every 8 (eight) hours as needed for severe pain. Do not take if you have epilepsy or a history of seizures. Swallow tablets whole. Do not chew, crush or dissolve., Disp: 90 tablet, Rfl: 0 .  testosterone (ANDROGEL) 50 MG/5GM (1%) GEL, Place 5 g onto the skin daily., Disp: , Rfl:  .  traZODone (DESYREL) 150 MG tablet, Take 1 tablet  (150 mg total) by mouth at bedtime., Disp: 90 tablet, Rfl: 1 .  triamcinolone cream (KENALOG) 0.1 %, Apply 1 application topically 2 (two) times daily., Disp: 45 g, Rfl: 0 .  amoxicillin (AMOXIL) 875 MG tablet, Take 1 tablet (875 mg total) by mouth 2 (two) times daily. (Patient not taking: Reported on 03/10/2018), Disp: 14 tablet, Rfl: 0 .  naloxegol oxalate (MOVANTIK) 25 MG TABS tablet, Take 1 tablet (25 mg total) by mouth daily. Take on an empty stomach at least 1 hour before or 2 hours after a meal., Disp: 30 tablet, Rfl: 0 .  [START ON 04/12/2018] tapentadol (NUCYNTA) 50 MG 12 hr tablet, Take 1 tablet (50 mg total) by mouth every 8 (eight) hours as needed. Do not take if you have epilepsy or a history of seizures. Swallow tablets whole. Do not chew, crush or dissolve., Disp: 60 tablet, Rfl: 0  ROS  Constitutional: Denies any fever or chills Gastrointestinal: No reported hemesis, hematochezia, vomiting, or acute GI distress Musculoskeletal: Denies any acute onset joint swelling, redness, loss of ROM, or weakness Neurological: No reported episodes of acute onset apraxia, aphasia, dysarthria, agnosia, amnesia, paralysis, loss of coordination, or loss of consciousness  Allergies  Mr. Krauser is allergic to azithromycin; clarithromycin; erythromycin; keflex [cephalexin]; metformin; and symbicort [budesonide-formoterol fumarate].  [place]  Drug: Mr. Lippman  reports that he does not use drugs. Alcohol:  reports that he does not drink alcohol. Tobacco:  reports that he has been smoking cigarettes.  He started smoking about 24 years ago. He has a 30.00 pack-year smoking history. He has quit using smokeless tobacco. Medical:  has a past medical history of Anxiety, Arthritis, Asthma, Chronic back pain, Chronic pain of left knee, COPD (chronic obstructive pulmonary disease) ([place]), Diabetes mellitus, GERD (gastroesophageal reflux disease), INGUINAL PAIN, LEFT (10/03/2009), Shortness of breath, and Sleep  apnea. Surgical: Mr. Kahrs  has a past surgical history that includes Carpal tunnel release (Left); Shoulder  Arthrocentesis (Right); Vasectomy; Hernia repair (Bilateral); Knee arthroscopy with medial menisectomy (Left, 04/22/2014); Knee arthroscopy with medial menisectomy (Right, 12/06/2015); Cataract extraction w/PHACO (Right, 03/11/2016); ORIF wrist fracture (Left, 12/19/2016); and Knee arthroscopy with medial menisectomy (Left, 03/20/2017). Family: family history includes Clotting disorder in his maternal grandmother; Emphysema in his maternal grandfather and maternal grandmother.  Constitutional Exam  General appearance: Well nourished, well developed, and well hydrated. In no apparent acute distress Vitals:   03/10/18 1058  BP: 137/84  Pulse: 81  Resp: 16  Temp: 98.4 F (36.9 C)  SpO2: 99%  Weight: 234 lb (106.1 kg)  Height: '5\' 10"'  (1.778 m)  Psych/Mental [name]: Alert, oriented x 3 (person, place, & time)       Eyes: PERLA Respiratory: No evidence of acute respiratory distress   Gait & Posture Assessment  Ambulation: Unassisted Gait: Relatively normal for age and body habitus Posture: WNL   Lower Extremity Exam    Side: Right lower extremity  Side: Left lower extremity  Skin & Extremity Inspection: Evidence of prior arthroplastic surgery  Skin & Extremity Inspection: Skin color, temperature, and hair growth are WNL. No peripheral edema or cyanosis. No masses, redness, swelling, asymmetry, or associated skin lesions. No contractures.  Functional ROM: Unrestricted ROM          Functional ROM: Decreased ROM          Muscle Tone/Strength: Functionally intact. No obvious neuro-muscular anomalies detected.  Muscle Tone/Strength: Functionally intact. No obvious neuro-muscular anomalies detected.  Sensory (Neurological): Unimpaired  Sensory (Neurological): Unimpaired  Palpation: No palpable anomalies  Palpation: No palpable anomalies   Assessment  Primary Diagnosis & Pertinent Problem  List: The primary encounter diagnosis was Osteoarthritis of the knee (Left). Diagnoses of Chronic knee pain (Primary Area of Pain) (Bilateral) (L>R), Chronic pain syndrome, Drug-induced constipation, Opioid-induced constipation (OIC), and Long term prescription opiate use were also pertinent to this visit.  [name] Diagnosis  Persistent Persistent Persistent 1. Osteoarthritis of the knee (Left)   2. Chronic knee pain (Primary Area of Pain) (Bilateral) (L>R)   3. Chronic pain syndrome   4. Drug-induced constipation   5. Opioid-induced constipation (OIC)   6. Long term prescription opiate use     Problems updated and reviewed during this visit: No problems updated. Plan of Care  Pharmacotherapy (Medications Ordered): Meds ordered this encounter  Medications  . tapentadol (NUCYNTA) 50 MG tablet    Sig: Take 1 tablet (50 mg total) by mouth every 8 (eight) hours as needed for severe pain. Do not take if you have epilepsy or a history of seizures. Swallow tablets whole. Do not chew, crush or dissolve.    Dispense:  90 tablet    Refill:  0    Do not place this medication, or any other prescription from our practice, on "Automatic Refill". Patient may have prescription filled one day early if pharmacy is closed on [name] refill date. Do not fill until: 03/13/2018 To last until:04/12/2018    Order Specific Question:   Supervising Provider    Answer:   Milinda Pointer [phone]  . tapentadol (NUCYNTA) 50 MG 12 hr tablet    Sig: Take 1 tablet (50 mg total) by mouth every 8 (eight) hours as needed. Do not take if you have epilepsy or a history of seizures. Swallow tablets whole. Do not chew, crush or dissolve.    Dispense:  60 tablet    Refill:  0    Do not place this medication, or any other prescription from  our practice, on "Automatic Refill". Patient may have prescription filled one day early if pharmacy is closed on [name] refill date. Do not fill until: 04/12/2018 To last  until:05/12/2018    Order Specific Question:   Supervising Provider    Answer:   Milinda Pointer [phone]  . naloxegol oxalate (MOVANTIK) 25 MG TABS tablet    Sig: Take 1 tablet (25 mg total) by mouth daily. Take on an empty stomach at least 1 hour before or 2 hours after a meal.    Dispense:  30 tablet    Refill:  0    Please instruct the patient not to break the tablet.    Order Specific Question:   Supervising Provider    Answer:   Milinda Pointer [phone]   New Prescriptions   NALOXEGOL OXALATE (MOVANTIK) 25 MG TABS TABLET    Take 1 tablet (25 mg total) by mouth daily. Take on an empty stomach at least 1 hour before or 2 hours after a meal.   TAPENTADOL (NUCYNTA) 50 MG 12 HR TABLET    Take 1 tablet (50 mg total) by mouth every 8 (eight) hours as needed. Do not take if you have epilepsy or a history of seizures. Swallow tablets whole. Do not chew, crush or dissolve.   Medications administered today: Arn Medal "Richard" had no medications administered during this visit. Lab-work, procedure(s), and/or referral(s): Orders Placed This Encounter  Procedures  . Radiofrequency,Genicular  . ToxASSURE Select 13 (MW), Urine   Imaging and/or referral(s): None  Interventional therapies: Planned, [name], and/or pending:     Left genicular RFA   Considering:   Diagnostic Left intra-articular knee injection Diagnostic Left genicular nerve block Possible Left genicular RFA Diagnostic Left Hyalgan series   Palliative PRN treatment(s):   Not at this time.      Provider-requested follow-up: Return in about 2 months (around 05/10/2018) for MedMgmt with Me Dionisio David), in addition, 2nd Visit, w/ Dr. Dossie Arbour, Procedure(w/Sedation).  Future Appointments  Date Time Provider [address]  04/21/2018  2:15 PM Milinda Pointer, MD [facility] None  05/07/2018 11:30 AM Vevelyn Francois, NP [facility] None  05/20/2018 10:30 AM Eustace Pen, LPN [facility]  [id number]   9:00 AM Pleas Koch, NP [facility]   Primary Care Physician: Pleas Koch, NP Location: [facility] Note by: Vevelyn Francois NP Date: 03/10/2018; Time: 3:21 PM  Pain Score Disclaimer: We use the NRS-11 scale. This is a self-reported, subjective measurement of pain severity with only modest accuracy. It is used primarily to identify changes within a particular patient. It must be understood that outpatient pain scales are significantly less accurate that those used for research, where they can be applied under ideal controlled circumstances with minimal exposure to variables. In reality, the score is likely to be a combination of pain intensity and pain affect, where pain affect describes the degree of emotional arousal or changes in action readiness caused by the sensory experience of pain. Factors such as social and work situation, setting, emotional state, anxiety levels, expectation, and prior pain experience may influence pain perception and show large inter-individual differences that may also be affected by time variables.  Patient instructions provided during this appointment: Patient Instructions  ____________________________________________________________________________________________  Medication Rules  Applies to: All patients receiving prescriptions (written or electronic).  Pharmacy of record: Pharmacy where electronic prescriptions will be sent. If written prescriptions are taken to a different pharmacy, please inform the nursing staff. The pharmacy  listed in the electronic medical record should be the one where you would like electronic prescriptions to be sent.  Prescription refills: Only during [name] appointments. Applies to both, written and electronic prescriptions.  NOTE: The following applies primarily to controlled substances (Opioid* Pain Medications).   Patient's responsibilities: 1. Pain Pills: Bring all pain pills to  every appointment (except for procedure appointments). 2. Pill Bottles: Bring pills in original pharmacy bottle. Always bring newest bottle. Bring bottle, even if empty. 3. Medication refills: You are responsible for knowing and keeping track of what medications you need refilled. The day before your appointment, write a list of all prescriptions that need to be refilled. Bring that list to your appointment and give it to the admitting nurse. Prescriptions will be written only during appointments. If you forget a medication, it will not be "Called in", "Faxed", or "electronically sent". You will need to get another appointment to get these prescribed. 4. Prescription Accuracy: You are responsible for carefully inspecting your prescriptions before leaving our office. Have the discharge nurse carefully go over each prescription with you, before taking them home. Make sure that your name is accurately spelled, that your address is correct. Check the name and dose of your medication to make sure it is accurate. Check the number of pills, and the written instructions to make sure they are clear and accurate. Make sure that you are given enough medication to last until your next medication refill appointment. 5. Taking Medication: Take medication as prescribed. Never take more pills than instructed. Never take medication more frequently than prescribed. Taking less pills or less frequently is permitted and encouraged, when it comes to controlled substances (written prescriptions).  6. Inform other Doctors: Always inform, all of your healthcare providers, of all the medications you take. 7. Pain Medication from other Providers: You are not allowed to accept any additional pain medication from any other Doctor or Healthcare provider. There are two exceptions to this rule. (see below) In the event that you require additional pain medication, you are responsible for notifying us, as stated below. 8. Medication  Agreement: You are responsible for carefully reading and following our Medication Agreement. This must be signed before receiving any prescriptions from our practice. Safely store a copy of your signed Agreement. Violations to the Agreement will result in no further prescriptions. (Additional copies of our Medication Agreement are available upon request.) 9. Laws, Rules, & Regulations: All patients are expected to follow all Federal and [employer], [employer], Rules, [employer] [facility]. Ignorance of the Laws does not constitute a valid excuse. The use of any illegal substances is prohibited. 10. Adopted CDC guidelines & recommendations: Target dosing levels will be at or below 60 MME/day. Use of benzodiazepines** is not recommended.  Exceptions: There are only two exceptions to the rule of not receiving pain medications from other Healthcare Providers. 1. Exception #1 (Emergencies): In the event of an emergency (i.e.: accident requiring emergency care), you are allowed to receive additional pain medication. However, you are responsible for: As soon as you are able, call our office [phone], at any time of the day or night, and leave a message stating your name, the date and nature of the emergency, and the name and dose of the medication prescribed. In the event that your call is answered by a member of our staff, make sure to document and save the date, time, and the name of the person that took your information.  2. Exception #2 (Planned Surgery): In  the event that you are [name] by another doctor or dentist to have any type of surgery or procedure, you are allowed (for a period no longer than 30 days), to receive additional pain medication, for the acute post-op pain. However, in this case, you are responsible for picking up a copy of our "Post-op Pain Management for Surgeons" handout, and giving it to your surgeon or dentist. This document is available at our office, and does not require an appointment  to obtain it. Simply go to our office during business hours (Monday-Thursday from 8:00 AM to 4:00 PM) (Friday 8:00 AM to 12:00 Noon) or if you have a [name] appointment with [place], prior to your surgery, and ask for it by name. In addition, you will need to provide [place] with your name, name of your surgeon, type of surgery, and date of procedure or surgery.  *Opioid medications include: morphine, codeine, oxycodone, oxymorphone, hydrocodone, hydromorphone, meperidine, tramadol, tapentadol, buprenorphine, fentanyl, methadone. **Benzodiazepine medications include: diazepam (Valium), alprazolam (Xanax), clonazepam (Klonopine), lorazepam (Ativan), clorazepate (Tranxene), chlordiazepoxide (Librium), estazolam (Prosom), oxazepam (Serax), temazepam (Restoril), triazolam (Halcion) (Last updated: 01/29/2018) ____________________________________________________________________________________________   Radiofrequency Lesioning Radiofrequency lesioning is a procedure that is performed to relieve pain. The procedure is often used for back, neck, or arm pain. Radiofrequency lesioning involves the use of a machine that creates radio waves to make heat. During the procedure, the heat is applied to the nerve that carries the pain signal. The heat damages the nerve and interferes with the pain signal. Pain relief usually starts about 2 weeks after the procedure and lasts for 6 months to 1 year. Tell a health care provider about:  Any allergies you have.  All medicines you are taking, including vitamins, herbs, eye drops, creams, and over-the-counter medicines.  Any problems you or family members have had with anesthetic medicines.  Any blood disorders you have.  Any surgeries you have had.  Any medical conditions you have.  Whether you are pregnant or may be pregnant. What are the risks? Generally, this is a safe procedure. However, problems may occur, including:  Pain or soreness at the injection  site.  Infection at the injection site.  Damage to nerves or blood vessels.  What happens before the procedure?  Ask your health care provider about: ? Changing or stopping your regular medicines. This is especially important if you are taking diabetes medicines or blood thinners. ? Taking medicines such as aspirin and ibuprofen. These medicines can thin your blood. Do not take these medicines before your procedure if your health care provider instructs you not to.  Follow instructions from your health care provider about eating or drinking restrictions.  Plan to have someone take you home after the procedure.  If you go home right after the procedure, plan to have someone with you for 24 hours. What happens during the procedure?  You will be given one or more of the following: ? A medicine to help you relax (sedative). ? A medicine to numb the area (local anesthetic).  You will be awake during the procedure. You will need to be able to talk with the health care provider during the procedure.  With the help of a type of X-ray (fluoroscopy), the health care provider will insert a radiofrequency needle into the area to be treated.  Next, a wire that carries the radio waves (electrode) will be put through the radiofrequency needle. An electrical pulse will be sent through the electrode to verify the correct nerve. You will  feel a tingling sensation, and you may have muscle twitching.  Then, the tissue that is around the needle tip will be heated by an electric current that is passed using the radiofrequency machine. This will numb the nerves.  A bandage (dressing) will be put on the insertion area after the procedure is done. The procedure may vary among health care providers and hospitals. What happens after the procedure?  Your blood pressure, heart rate, breathing rate, and blood oxygen level will be monitored often until the medicines you were given have worn off.  Return to  your normal activities as directed by your health care provider. This information is not intended to replace advice given to you by your health care provider. Make sure you discuss any questions you have with your health care provider. Document Released: 07/17/2011 Document Revised: 04/25/2016 Document Reviewed: 12/26/2014 Elsevier Interactive Patient Education  [name].

## 2018-03-10 NOTE — Progress Notes (Signed)
Nursing Pain Medication Assessment:  Safety precautions to be maintained throughout the outpatient stay will include: orient to surroundings, keep bed in low position, maintain call bell within reach at all times, provide assistance with transfer out of bed and ambulation.  Medication Inspection Compliance: Pill count conducted under aseptic conditions, in front of the patient. Neither the pills nor the bottle was removed from the patient's sight at any time. Once count was completed pills were immediately returned to the patient in their original bottle.  Medication: Tapentadol (Nucynta) Pill/Patch Count: 14 of 90 pills remain Pill/Patch Appearance: Markings consistent with prescribed medication Bottle Appearance: Standard pharmacy container. Clearly labeled. Filled Date: 03 / 13 / 2019 Last Medication intake:  Today

## 2018-03-12 ENCOUNTER — Telehealth: Payer: Self-pay | Admitting: Nurse Practitioner

## 2018-03-12 ENCOUNTER — Telehealth: Payer: Self-pay | Admitting: *Deleted

## 2018-03-12 NOTE — Telephone Encounter (Signed)
Pharmacy needs to verify scripts for patient. Please call

## 2018-03-12 NOTE — Telephone Encounter (Signed)
Spoke with pharmacist re; Rx for 03/13/18 and 04/12/18.  The first is for Nucynta 50 mg 1 q 8 hours qty 90 and the 2nd is for Nucynta 50 mg 12 hr tablet 1 q 8 hours qty 60.  Spoke with Crystal to confirm and the 2nd Rx was given a verbal to change to 50 mg 1 q 8 hours qty 90.

## 2018-03-14 LAB — TOXASSURE SELECT 13 (MW), URINE

## 2018-03-18 ENCOUNTER — Ambulatory Visit (INDEPENDENT_AMBULATORY_CARE_PROVIDER_SITE_OTHER): Payer: Medicare Other | Admitting: Orthopedic Surgery

## 2018-03-18 ENCOUNTER — Ambulatory Visit: Payer: Medicare Other | Admitting: Orthopedic Surgery

## 2018-03-18 ENCOUNTER — Telehealth: Payer: Self-pay | Admitting: Orthopedic Surgery

## 2018-03-18 VITALS — BP 150/82 | HR 95 | Ht 70.0 in | Wt 234.0 lb

## 2018-03-18 DIAGNOSIS — M25462 Effusion, left knee: Secondary | ICD-10-CM

## 2018-03-18 DIAGNOSIS — Z9889 Other specified postprocedural states: Secondary | ICD-10-CM | POA: Diagnosis not present

## 2018-03-18 NOTE — Telephone Encounter (Signed)
Called back to patient, offered cancellation slot appointment today.

## 2018-03-18 NOTE — Telephone Encounter (Signed)
Patient called to relay that his left knee (status/post arthroscopy 03/20/17) has fluid again.  Okay to schedule (based on last office notes(02/09/18) which indicate:  "[facility] who advised partial knee replacement however this was canceled secondary to high hemoglobin A1c He then went [facility] they recommend waiting until his age was appropriate for total knee He also went flex [facility]'s for 1 visit" - okay to schedule for possible aspiration?

## 2018-03-18 NOTE — Telephone Encounter (Signed)
Ok to sch next available

## 2018-03-18 NOTE — Progress Notes (Signed)
Chief Complaint  Patient presents with  . Follow-up    Recheck on left knee    Procedure note injection and aspiration left knee joint  Verbal consent was obtained to aspirate and inject the left knee joint   Timeout was completed to confirm the site of aspiration and injection  An 18-gauge needle was used to aspirate the left knee joint from a suprapatellar lateral approach.  The medications used were 40 mg of Depo-Medrol and 1% lidocaine 3 cc  Anesthesia was provided by ethyl chloride and the skin was prepped with alcohol.  After cleaning the skin with alcohol an 18-gauge needle was used to aspirate the right knee joint.  We obtained 45 cc of fluid CLEAR  We followed this by injection of 40 mg of Depo-Medrol and 3 cc 1% lidocaine.  There were no complications. A sterile bandage was applied.  Encounter Diagnoses  Name Primary?  . S/P arthroscopy of left knee Yes  . Effusion of knee joint, left     FU PRN

## 2018-03-24 ENCOUNTER — Observation Stay (HOSPITAL_COMMUNITY)
Admission: EM | Admit: 2018-03-24 | Discharge: 2018-03-25 | Disposition: A | Discharge status: Home / Self Care | Payer: Medicare Other | Attending: Family Medicine | Admitting: Family Medicine

## 2018-03-24 ENCOUNTER — Other Ambulatory Visit: Payer: Self-pay

## 2018-03-24 ENCOUNTER — Encounter (HOSPITAL_COMMUNITY): Payer: Self-pay | Admitting: Emergency Medicine

## 2018-03-24 DIAGNOSIS — Z888 Allergy status to other drugs, medicaments and biological substances status: Secondary | ICD-10-CM | POA: Insufficient documentation

## 2018-03-24 DIAGNOSIS — Z9989 Dependence on other enabling machines and devices: Secondary | ICD-10-CM | POA: Insufficient documentation

## 2018-03-24 DIAGNOSIS — E876 Hypokalemia: Secondary | ICD-10-CM | POA: Diagnosis not present

## 2018-03-24 DIAGNOSIS — G894 Chronic pain syndrome: Secondary | ICD-10-CM | POA: Diagnosis not present

## 2018-03-24 DIAGNOSIS — E1165 Type 2 diabetes mellitus with hyperglycemia: Secondary | ICD-10-CM

## 2018-03-24 DIAGNOSIS — Y92810 Car as the place of occurrence of the external cause: Secondary | ICD-10-CM | POA: Diagnosis not present

## 2018-03-24 DIAGNOSIS — G92 Toxic encephalopathy: Secondary | ICD-10-CM | POA: Diagnosis not present

## 2018-03-24 DIAGNOSIS — F17210 Nicotine dependence, cigarettes, uncomplicated: Secondary | ICD-10-CM | POA: Diagnosis not present

## 2018-03-24 DIAGNOSIS — Z79899 Other long term (current) drug therapy: Secondary | ICD-10-CM | POA: Insufficient documentation

## 2018-03-24 DIAGNOSIS — G9340 Encephalopathy, unspecified: Secondary | ICD-10-CM | POA: Diagnosis not present

## 2018-03-24 DIAGNOSIS — M545 Low back pain: Secondary | ICD-10-CM | POA: Diagnosis not present

## 2018-03-24 DIAGNOSIS — J449 Chronic obstructive pulmonary disease, unspecified: Secondary | ICD-10-CM | POA: Diagnosis present

## 2018-03-24 DIAGNOSIS — T40601A Poisoning by unspecified narcotics, accidental (unintentional), initial encounter: Secondary | ICD-10-CM

## 2018-03-24 DIAGNOSIS — Z881 Allergy status to other antibiotic agents status: Secondary | ICD-10-CM | POA: Insufficient documentation

## 2018-03-24 DIAGNOSIS — M1712 Unilateral primary osteoarthritis, left knee: Secondary | ICD-10-CM | POA: Insufficient documentation

## 2018-03-24 DIAGNOSIS — K219 Gastro-esophageal reflux disease without esophagitis: Secondary | ICD-10-CM | POA: Diagnosis present

## 2018-03-24 DIAGNOSIS — Z7984 Long term (current) use of oral hypoglycemic drugs: Secondary | ICD-10-CM | POA: Diagnosis not present

## 2018-03-24 DIAGNOSIS — T402X1A Poisoning by other opioids, accidental (unintentional), initial encounter: Principal | ICD-10-CM | POA: Insufficient documentation

## 2018-03-24 DIAGNOSIS — G4733 Obstructive sleep apnea (adult) (pediatric): Secondary | ICD-10-CM | POA: Diagnosis not present

## 2018-03-24 DIAGNOSIS — F419 Anxiety disorder, unspecified: Secondary | ICD-10-CM | POA: Insufficient documentation

## 2018-03-24 DIAGNOSIS — E119 Type 2 diabetes mellitus without complications: Secondary | ICD-10-CM

## 2018-03-24 HISTORY — DX: Encephalopathy, unspecified: G93.40

## 2018-03-24 LAB — GLUCOSE, CAPILLARY: GLUCOSE-CAPILLARY: 137 mg/dL — AB (ref 65–99)

## 2018-03-24 LAB — CBC WITH DIFFERENTIAL/PLATELET
Basophils Absolute: 0.1 K/uL (ref 0.0–0.1)
Basophils Relative: 1 %
Eosinophils Absolute: 0.3 K/uL (ref 0.0–0.7)
Eosinophils Relative: 3 %
HCT: 44.6 % (ref 39.0–52.0)
Hemoglobin: 15.1 g/dL (ref 13.0–17.0)
Lymphocytes Relative: 24 %
Lymphs Abs: 2.4 K/uL (ref 0.7–4.0)
MCH: 30.3 pg (ref 26.0–34.0)
MCHC: 33.9 g/dL (ref 30.0–36.0)
MCV: 89.6 fL (ref 78.0–100.0)
Monocytes Absolute: 0.6 K/uL (ref 0.1–1.0)
Monocytes Relative: 6 %
Neutro Abs: 6.7 K/uL (ref 1.7–7.7)
Neutrophils Relative %: 66 %
Platelets: 203 K/uL (ref 150–400)
RBC: 4.98 MIL/uL (ref 4.22–5.81)
RDW: 14 % (ref 11.5–15.5)
WBC: 10.1 K/uL (ref 4.0–10.5)

## 2018-03-24 LAB — COMPREHENSIVE METABOLIC PANEL WITH GFR
ALT: 14 U/L — ABNORMAL LOW (ref 17–63)
AST: 14 U/L — ABNORMAL LOW (ref 15–41)
Albumin: 4.2 g/dL (ref 3.5–5.0)
Alkaline Phosphatase: 77 U/L (ref 38–126)
Anion gap: 13 (ref 5–15)
BUN: 7 mg/dL (ref 6–20)
CO2: 24 mmol/L (ref 22–32)
Calcium: 9.1 mg/dL (ref 8.9–10.3)
Chloride: 103 mmol/L (ref 101–111)
Creatinine, Ser: 0.8 mg/dL (ref 0.61–1.24)
GFR calc Af Amer: >60 mL/min
GFR calc non Af Amer: >60 mL/min
Glucose, Bld: 231 mg/dL — ABNORMAL HIGH (ref 65–99)
Potassium: 3.3 mmol/L — ABNORMAL LOW (ref 3.5–5.1)
Sodium: 140 mmol/L (ref 135–145)
Total Bilirubin: 0.8 mg/dL (ref 0.3–1.2)
Total Protein: 7.1 g/dL (ref 6.5–8.1)

## 2018-03-24 LAB — ETHANOL: Alcohol, Ethyl (B): <10 mg/dL

## 2018-03-24 LAB — SALICYLATE LEVEL: Salicylate Lvl: <7 mg/dL (ref 2.8–30.0)

## 2018-03-24 LAB — RAPID URINE DRUG SCREEN, HOSP PERFORMED
Amphetamines: NOT DETECTED
Barbiturates: NOT DETECTED
Benzodiazepines: NOT DETECTED
Cocaine: NOT DETECTED
Opiates: POSITIVE — AB
Tetrahydrocannabinol: NOT DETECTED

## 2018-03-24 LAB — MRSA PCR SCREENING: MRSA by PCR: NEGATIVE

## 2018-03-24 LAB — ACETAMINOPHEN LEVEL: Acetaminophen (Tylenol), Serum: <10 ug/mL — ABNORMAL LOW (ref 10–30)

## 2018-03-24 MED ORDER — NALOXONE HCL 2 MG/2ML IJ SOSY
PREFILLED_SYRINGE | INTRAMUSCULAR | Status: AC
Start: 2018-03-24 — End: 2018-03-24
  Filled 2018-03-24: qty 4

## 2018-03-24 MED ORDER — HYDRALAZINE HCL 20 MG/ML IJ SOLN: 10.0000 mg | INTRAMUSCULAR | Status: DC | PRN

## 2018-03-24 MED ORDER — NALOXONE HCL 2 MG/2ML IJ SOSY
PREFILLED_SYRINGE | INTRAMUSCULAR | Status: AC
  Filled 2018-03-24: qty 4

## 2018-03-24 MED ORDER — ACETAMINOPHEN 325 MG PO TABS: 650.0000 mg | ORAL_TABLET | Freq: Four times a day (QID) | ORAL | Status: DC | PRN

## 2018-03-24 MED ORDER — NALOXONE HCL 0.4 MG/ML IJ SOLN
0.4000 mg | Freq: Once | INTRAMUSCULAR | Status: AC
  Administered 2018-03-24: 0.4 mg via INTRAVENOUS

## 2018-03-24 MED ORDER — ONDANSETRON HCL 4 MG/2ML IJ SOLN: 4.0000 mg | Freq: Four times a day (QID) | INTRAMUSCULAR | Status: DC | PRN

## 2018-03-24 MED ORDER — LACTATED RINGERS IV SOLN
INTRAVENOUS | Status: DC
  Administered 2018-03-24: 22:00:00 via INTRAVENOUS

## 2018-03-24 MED ORDER — NALOXONE HCL 0.4 MG/ML IJ SOLN
INTRAMUSCULAR | Status: AC
  Filled 2018-03-24: qty 4

## 2018-03-24 MED ORDER — NICOTINE 7 MG/24HR TD PT24
7.0000 mg | MEDICATED_PATCH | Freq: Every day | TRANSDERMAL | Status: DC
  Filled 2018-03-24 (×4): qty 1

## 2018-03-24 MED ORDER — SODIUM CHLORIDE 0.9 % IV BOLUS
1000.0000 mL | Freq: Once | INTRAVENOUS | Status: AC
  Administered 2018-03-24: 1000 mL via INTRAVENOUS

## 2018-03-24 MED ORDER — NALOXONE HCL 4 MG/10ML IJ SOLN
1.0000 mg/h | INTRAVENOUS | Status: DC
  Filled 2018-03-24: qty 10

## 2018-03-24 MED ORDER — DEXTROSE 5 % IV SOLN
1.0000 mg/h | INTRAVENOUS | Status: DC
  Administered 2018-03-24: 2 mg/h via INTRAVENOUS
  Administered 2018-03-24 (×2): 5 mg/h via INTRAVENOUS
  Administered 2018-03-24: 1 mg/h via INTRAVENOUS
  Administered 2018-03-24: 5 mg/h via INTRAVENOUS
  Administered 2018-03-25: 1 mg/h via INTRAVENOUS
  Filled 2018-03-24 (×10): qty 4

## 2018-03-24 MED ORDER — IPRATROPIUM-ALBUTEROL 0.5-2.5 (3) MG/3ML IN SOLN
3.0000 mL | Freq: Four times a day (QID) | RESPIRATORY_TRACT | Status: DC | PRN
  Administered 2018-03-24 – 2018-03-25 (×2): 3 mL via RESPIRATORY_TRACT
  Filled 2018-03-24 (×2): qty 3

## 2018-03-24 MED ORDER — POTASSIUM CHLORIDE 10 MEQ/100ML IV SOLN
10.0000 meq | INTRAVENOUS | Status: AC
  Administered 2018-03-24 (×4): 10 meq via INTRAVENOUS
  Filled 2018-03-24 (×3): qty 100

## 2018-03-24 MED ORDER — ENOXAPARIN SODIUM 40 MG/0.4ML ~~LOC~~ SOLN
40.0000 mg | SUBCUTANEOUS | Status: DC
  Administered 2018-03-24: 40 mg via SUBCUTANEOUS
  Filled 2018-03-24: qty 0.4

## 2018-03-24 MED ORDER — ONDANSETRON HCL 4 MG PO TABS: 4.0000 mg | ORAL_TABLET | Freq: Four times a day (QID) | ORAL | Status: DC | PRN

## 2018-03-24 MED ORDER — NALOXONE HCL 2 MG/2ML IJ SOSY
PREFILLED_SYRINGE | INTRAMUSCULAR | Status: AC
  Filled 2018-03-24: qty 2

## 2018-03-24 MED ORDER — ACETAMINOPHEN 650 MG RE SUPP: 650.0000 mg | Freq: Four times a day (QID) | RECTAL | Status: DC | PRN

## 2018-03-24 MED ORDER — INSULIN ASPART 100 UNIT/ML ~~LOC~~ SOLN
0.0000 [IU] | SUBCUTANEOUS | Status: DC
  Administered 2018-03-24: 3 [IU] via SUBCUTANEOUS

## 2018-03-24 MED ORDER — SODIUM CHLORIDE 0.9 % IV BOLUS (SEPSIS)
1000.0000 mL | Freq: Once | INTRAVENOUS | Status: AC
  Administered 2018-03-24: 1000 mL via INTRAVENOUS

## 2018-03-24 NOTE — ED Notes (Signed)
Placed pt on 2 liters [place] oxygen

## 2018-03-24 NOTE — ED Notes (Signed)
Pt awakens after dose of narcan.

## 2018-03-24 NOTE — ED Notes (Signed)
Pt placed on CPap at this time per verbal order by Dr. Sherryll BurgerShah

## 2018-03-24 NOTE — ED Notes (Signed)
Spoke with poison control.  Half life for nucynta is 4 hours.  Expect peak drug affects  90 min to 3 hour.  If pt becomes symptomatic,  Recommend low doses of narcan.  Pt is at a slight  risk for seizures.  Recommend 4 hours observation and if pt becomes symptomatic recommend overnight observation.

## 2018-03-24 NOTE — ED Notes (Signed)
Pt very drowsy.  Eyes shut.  Slurred speech.  Giving another dose of narcan.

## 2018-03-24 NOTE — ED Notes (Signed)
Pt becoming drowsy.  Giving 0.4mg  narcan IVP.

## 2018-03-24 NOTE — ED Notes (Signed)
Pt still very drowsy.  Dr. Adriana Simasook at bedside.  Orders to increase narcan drip.

## 2018-03-24 NOTE — ED Triage Notes (Signed)
Pt took approx 50 nucynta 50mg .  Pt fixes his daily medications in a an extra pill bottle.  Pt turned up the the nucynta bottle instead.   This happened immediatly prior to coming into ED.

## 2018-03-24 NOTE — H&P (Signed)
History and Physical    Joseph ForthJerry R Cheslock ZHY:[id number] DOB: [birth year] DOA: 03/24/2018  PCP: Doreene Nestlark, Katherine K, NP   Patient coming from: Home  Chief Complaint: Opiate overdose  HPI: Joseph [name] is a 47 y.o. male with medical history significant for COPD with ongoing tobacco abuse, obstructive sleep apnea on CPAP, chronic pain syndrome with opiate use, GERD, and type 2 diabetes who was driving to [place] earlier today when he accidentally ingested what appears to be approximately 50 tablets of 50mg  Nucynta.  He usually keeps multiple medications in a single bottle and apparently picked up the wrong bottle and realized that he had taken a handful of the wrong medication shortly after he ingested them.  He immediately turned around and came to the emergency department and actually arrived alert and awake and started to become quite somnolent.  Patient cannot give much history on account of his current condition but there appears to be no intent to harm himself.   ED Course: Vital signs were noted to be stable and laboratory data is only remarkable for potassium of 3.3 and glucose of 231.  ED physician had given multiple doses of Narcan with temporary improvement in alertness and therefore, has placed him on a Narcan drip.  He appears able to protect his airway and is currently on 2 L nasal cannula with oxygen saturation in the high 80th percentile.  Poison control recommends keeping on Narcan drip until alertness improves and monitoring labs as well as repeat EKG in a.m.  He is currently arousable and will answer some questions appropriately.  Review of Systems: Cannot be appropriately obtained due to patient condition.  Past Medical History:  Diagnosis Date  . Anxiety   . Arthritis   . Asthma   . Chronic back pain   . Chronic pain of left knee   . COPD (chronic obstructive pulmonary disease) (HCC)   . Diabetes mellitus   . GERD (gastroesophageal reflux disease)   . INGUINAL PAIN, LEFT  10/03/2009   Qualifier: Diagnosis of  By: Darrick PennaFields MD, Sandi L   . Shortness of breath   . Sleep apnea     Past Surgical History:  Procedure Laterality Date  . CARPAL TUNNEL RELEASE Left   . CATARACT EXTRACTION W/PHACO Right 03/11/2016   Procedure: CATARACT EXTRACTION PHACO AND INTRAOCULAR LENS PLACEMENT (IOC);  Surgeon: Gemma PayorKerry Hunt, MD;  Location: [facility];  Service: Ophthalmology;  Laterality: Right;  CDE 4.24  . HERNIA REPAIR Bilateral   . KNEE ARTHROSCOPY WITH MEDIAL MENISECTOMY Left 04/22/2014   Procedure: LEFT KNEE ARTHROSCOPY WITH MEDIAL MENISECTOMY;  Surgeon: Vickki HearingStanley E Harrison, MD;  Location: [facility];  Service: Orthopedics;  Laterality: Left;  . KNEE ARTHROSCOPY WITH MEDIAL MENISECTOMY Right 12/06/2015   Procedure: RIGHT KNEE ARTHROSCOPY WITH MEDIAL MENISECTOMY;  Surgeon: Vickki HearingStanley E Harrison, MD;  Location: [facility];  Service: Orthopedics;  Laterality: Right;  . KNEE ARTHROSCOPY WITH MEDIAL MENISECTOMY Left 03/20/2017   Procedure: KNEE ARTHROSCOPY WITH MEDIAL MENISECTOMY;  Surgeon: Vickki HearingStanley E Harrison, MD;  Location: [facility];  Service: Orthopedics;  Laterality: Left;  . ORIF WRIST FRACTURE Left 12/19/2016   Procedure: OPEN REDUCTION INTERNAL FIXATION (ORIF) WRIST FRACTURE;  Surgeon: Betha LoaKevin Kuzma, MD;  Location: [facility];  Service: Orthopedics;  Laterality: Left;  accumed   . SHOULDER ARTHROCENTESIS Right   . VASECTOMY       reports that he has been smoking cigarettes.  He started smoking about 24 years ago. He has a 30.00 pack-year smoking  history. He has quit using smokeless tobacco. He reports that he does not drink alcohol or use drugs.  Allergies  Allergen Reactions  . Azithromycin Hives  . Clarithromycin   . Erythromycin Hives  . Keflex [Cephalexin] Nausea Only  . Metformin Nausea And Vomiting  . Symbicort [Budesonide-Formoterol Fumarate] Other (See Comments)    States that 3 doses were used and breathing became worse    Family History  Problem Relation Age of Onset    . Emphysema Maternal Grandfather   . Emphysema Maternal Grandmother   . Clotting disorder Maternal Grandmother     Prior to Admission medications   Medication Sig Start Date End Date Taking? Authorizing Provider  ANDROGEL PUMP 20.25 MG/ACT (1.62%) GEL Apply 2 Pump topically daily.  02/25/18  Yes [provider]  atorvastatin (LIPITOR) 20 MG tablet Take 1 tablet (20 mg total) by mouth every evening. 05/12/17  Yes Doreene Nest, NP  betamethasone dipropionate (DIPROLENE) 0.05 % cream Apply topically 2 (two) times daily as needed.  03/05/18  Yes [provider]  buPROPion (WELLBUTRIN SR) 150 MG 12 hr tablet TAKE ONE TABLET BY MOUTH TWICE A DAY 02/25/18  Yes Doreene Nest, NP  celecoxib (CELEBREX) 200 MG capsule Take 200 mg by mouth 2 (two) times daily.    Yes [provider]  ciclopirox (LOPROX) 0.77 % cream Apply topically 2 (two) times daily. 12/03/17  Yes Doreene Nest, NP  citalopram (CELEXA) 40 MG tablet TAKE ONE (1) TABLET BY MOUTH EVERY DAY 04/10/17  Yes Doreene Nest, NP  empagliflozin (JARDIANCE) 10 MG TABS tablet Take 10 mg by mouth every morning. 09/09/17  Yes Doreene Nest, NP  fluticasone furoate-vilanterol (BREO ELLIPTA) 200-25 MCG/INH AEPB Inhale 1 puff into the lungs daily. 05/23/17  Yes Nyoka Cowden, MD  Ipratropium-Albuterol (COMBIVENT) 20-100 MCG/ACT AERS respimat Inhale 1 puff into the lungs every 6 (six) hours as needed for wheezing. 05/23/17  Yes Nyoka Cowden, MD  lansoprazole (PREVACID) 30 MG capsule TAKE ONE CAPSULE BY MOUTH TWICE A DAY BEFORE A MEAL 01/22/18  Yes Doreene Nest, NP  metFORMIN (GLUCOPHAGE-XR) 500 MG 24 hr tablet TAKE 1 TABLET DAILY WITH BREAKFAST FOR 2WEEKS, THEN ADVANCE TO 2 TABLETS WITH BREAKFAST THEREAFTER Patient taking differently: Take 1,000 mg by mouth daily with breakfast.  11/14/17  Yes Doreene Nest, NP  naloxegol oxalate (MOVANTIK) 25 MG TABS tablet Take 1 tablet (25 mg total) by mouth daily.  Take on an empty stomach at least 1 hour before or 2 hours after a meal. 03/10/18 04/09/18 Yes Barbette Merino, NP  tamsulosin (FLOMAX) 0.4 MG CAPS capsule TAKE ONE (1) CAPSULE EACH DAY 11/14/17  Yes Doreene Nest, NP  tapentadol (NUCYNTA) 50 MG tablet Take 1 tablet (50 mg total) by mouth every 8 (eight) hours as needed for severe pain. Do not take if you have epilepsy or a history of seizures. Swallow tablets whole. Do not chew, crush or dissolve. 03/13/18 04/12/18 Yes Barbette Merino, NP  traZODone (DESYREL) 150 MG tablet Take 1 tablet (150 mg total) by mouth at bedtime. 11/20/17  Yes Doreene Nest, NP  albuterol (PROVENTIL) (2.5 MG/3ML) 0.083% nebulizer solution USE 1 VIAL VIA NEBULIZER FOUR TIMES DAILY AS NEEDED FOR SHORTNESS OF BREATH 12/31/17   Nyoka Cowden, MD  amoxicillin (AMOXIL) 875 MG tablet Take 1 tablet (875 mg total) by mouth 2 (two) times daily. Patient not taking: Reported on 03/10/2018 02/20/18   Doreene Nest,  NP  CINNAMON PO Take 2,800 mg by mouth daily. EACH TABLET IS 1400 MG.  TAKES 2 CAPSULES DAILY    [provider]  Continuous Blood Gluc Receiver (FREESTYLE LIBRE 14 DAY READER) DEVI 1 Device by Does not apply route 3 (three) times daily. 10/21/17   Doreene Nest, NP  Continuous Blood Gluc Sensor (FREESTYLE LIBRE 14 DAY SENSOR) MISC 1 Device by Does not apply route 3 (three) times daily. 10/21/17   Doreene Nest, NP  diphenhydrAMINE (BENADRYL) 25 MG tablet Take 25 mg by mouth 2 (two) times daily.    [provider]  glipiZIDE (GLUCOTROL) 5 MG tablet TAKE TWO TABLETS BY MOUTH TWICE DAILY 10/07/17   Doreene Nest, NP  ipratropium (ATROVENT) 0.02 % nebulizer solution USE 1 VIAL IN NEBULIZER FOUR TIMES DAILY AS NEEDED 04/21/17   Doreene Nest, NP  lubiprostone (AMITIZA) 24 MCG capsule Take 1 capsule (24 mcg total) by mouth 2 (two) times daily. Patient not taking: Reported on 03/18/2018 02/11/18 05/12/18  Barbette Merino, NP  Multiple Vitamin  (MULTIVITAMIN WITH MINERALS) TABS Take 1 tablet by mouth every morning. Men's once daily multivitamin packet (vitamin e, calcium, ginseng)    [provider]  Saw Palmetto, Serenoa repens, (SAW PALMETTO PO) Take 1 capsule by mouth daily.    [provider]  tapentadol (NUCYNTA) 50 MG 12 hr tablet Take 1 tablet (50 mg total) by mouth every 8 (eight) hours as needed. Do not take if you have epilepsy or a history of seizures. Swallow tablets whole. Do not chew, crush or dissolve. 04/12/18 05/12/18  Barbette Merino, NP    Physical Exam: Vitals:   03/24/18 1900 03/24/18 1915 03/24/18 1930 03/24/18 1945  BP: (!) 132/104 (!) 132/98 (!) 132/94 (!) 118/105  Pulse: 96 93 92 92  Resp: 14 17 14 17   SpO2: 96% 96% 96% 96%  Weight:      Height:        Constitutional: Somnolent, but arousable, obese Vitals:   03/24/18 1900 03/24/18 1915 03/24/18 1930 03/24/18 1945  BP: (!) 132/104 (!) 132/98 (!) 132/94 (!) 118/105  Pulse: 96 93 92 92  Resp: 14 17 14 17   SpO2: 96% 96% 96% 96%  Weight:      Height:       Eyes: lids and conjunctivae normal ENMT: Mucous membranes are moist.  Neck: normal, supple Respiratory: clear to auscultation bilaterally. Normal respiratory effort. No accessory muscle use.  Cardiovascular: Regular rate and rhythm, no murmurs. No extremity edema. Abdomen: no tenderness, no distention. Bowel sounds positive.  Musculoskeletal:  No joint deformity upper and lower extremities.   Skin: no rashes, lesions, ulcers.   Labs on Admission: I have personally reviewed following labs and imaging studies  CBC: Recent Labs  Lab 03/24/18 1758  WBC 10.1  NEUTROABS 6.7  HGB 15.1  HCT 44.6  MCV 89.6  PLT 203   Basic Metabolic Panel: Recent Labs  Lab 03/24/18 1758  NA 140  K 3.3*  CL 103  CO2 24  GLUCOSE 231*  BUN 7  CREATININE 0.80  CALCIUM 9.1   GFR: Estimated Creatinine Clearance: 139.2 mL/min (by C-G formula based on SCr of 0.8 mg/dL). Liver Function  Tests: Recent Labs  Lab 03/24/18 1758  AST 14*  ALT 14*  ALKPHOS 77  BILITOT 0.8  PROT 7.1  ALBUMIN 4.2   No results for input(s): LIPASE, AMYLASE in the last 168 hours. No results for input(s): AMMONIA in the last  168 hours. Coagulation Profile: No results for input(s): INR, PROTIME in the last 168 hours. Cardiac Enzymes: No results for input(s): CKTOTAL, CKMB, CKMBINDEX, TROPONINI in the last 168 hours. BNP (last 3 results) No results for input(s): PROBNP in the last 8760 hours. HbA1C: No results for input(s): HGBA1C in the last 72 hours. CBG: No results for input(s): GLUCAP in the last 168 hours. Lipid Profile: No results for input(s): CHOL, HDL, LDLCALC, TRIG, CHOLHDL, LDLDIRECT in the last 72 hours. Thyroid Function Tests: No results for input(s): TSH, T4TOTAL, FREET4, T3FREE, THYROIDAB in the last 72 hours. Anemia Panel: No results for input(s): VITAMINB12, FOLATE, FERRITIN, TIBC, IRON, RETICCTPCT in the last 72 hours. Urine analysis:    Component Value Date/Time   COLORURINE YELLOW 03/20/2015 1250   APPEARANCEUR HAZY (A) 03/20/2015 1250   LABSPEC 1.025 03/20/2015 1250   PHURINE 5.5 03/20/2015 1250   GLUCOSEU 250 (A) 03/20/2015 1250   HGBUR NEGATIVE 03/20/2015 1250   BILIRUBINUR negative 04/01/2017 0956   KETONESUR NEGATIVE 03/20/2015 1250   PROTEINUR negative 04/01/2017 0956   PROTEINUR NEGATIVE 03/20/2015 1250   UROBILINOGEN negative (A) 04/01/2017 0956   UROBILINOGEN 0.2 03/20/2015 1250   NITRITE negative 04/01/2017 0956   NITRITE NEGATIVE 03/20/2015 1250   LEUKOCYTESUR Negative 04/01/2017 0956    Radiological Exams on Admission: No results found.  EKG: Independently reviewed.  Sinus tachycardia 108 bpm. QTc .  Assessment/Plan Principal Problem:   Acute encephalopathy Active Problems:   Chronic pain syndrome   Diabetes (HCC)   OSA (obstructive sleep apnea)   GERD (gastroesophageal reflux disease)   COPD without exacerbation (HCC)   Opiate  overdose (HCC)    1. Acute encephalopathy secondary to unintentional opiate overdose.  Continue to monitor in stepdown unit with close neurochecks and Narcan drip until further awake and alert.  Avoid any current sedating medications.  Keep n.p.o. until fully awake and alert and use home CPAP to maintain adequate oxygen saturation.  Recheck a.m. labs and EKG. 2. Mild hypokalemia.  Replete with IV and recheck in a.m. 3. Hyperglycemia with history of type 2 diabetes.  Hold home oral medications and maintain on sliding scale insulin with every 4 hours Accu-Cheks. 4. Obstructive sleep apnea.  Maintain on CPAP and monitor pulse oximetry. 5. COPD with ongoing tobacco abuse.  Duo nebs every 6 hours as needed as needed for shortness of breath or wheezing and nicotine patch. 6. Chronic pain with lumbago and osteoarthritis of knee.  Hold all narcotics and monitor closely.   DVT prophylaxis: Lovenox Code Status: Full Family Communication: Fianc and daughter at bedside Disposition Plan: Narcan drip until encephalopathy resolves Consults called: None Admission status: Observation, stepdown unit   Zelta Enfield Hoover Brunette DO [facility] Pager [phone]  If 7PM-7AM, please contact night-coverage [email or web address] Password [facility]  03/24/2018, 8:19 PM

## 2018-03-24 NOTE — Progress Notes (Signed)
Pt placed on 3L [place] for transport. Pt remains on 3L [place] until admission to ICU is complete

## 2018-03-24 NOTE — ED Provider Notes (Addendum)
[facility] EMERGENCY DEPARTMENT Provider Note   CSN: [id number] Arrival date & time: 03/24/18  1725     History   Chief Complaint Chief Complaint  Patient presents with  . Ingestion    HPI Joseph [name] is a 47 y.o. male.  Level 5 caveat for urgent need for intervention and acuity of condition.  Patient presents with an accidental ingestion of multiple tablets of Nucynta.  He thought he was taking his normal prescribed medication, but grabbed the wrong bottle. He immediately came to the emergency department.  On initial exam he was lucid with normal respirations.     Past Medical History:  Diagnosis Date  . Anxiety   . Arthritis   . Asthma   . Chronic back pain   . Chronic pain of left knee   . COPD (chronic obstructive pulmonary disease) (HCC)   . Diabetes mellitus   . GERD (gastroesophageal reflux disease)   . INGUINAL PAIN, LEFT 10/03/2009   Qualifier: Diagnosis of  By: Darrick Penna MD, Sandi L   . Shortness of breath   . Sleep apnea     Patient Active Problem List   Diagnosis Date Noted  . Arthropathy of left knee 01/12/2018  . Opioid-induced constipation (OIC) 01/12/2018  . Tinea corporis 01/07/2018  . Tinea versicolor 01/07/2018  . Pharmacologic therapy 12/08/2017  . Problems influencing health status 12/08/2017  . Long term prescription opiate use 12/08/2017  . Opiate use 12/08/2017  . Insomnia 11/20/2017  . Disorder of skeletal system 11/05/2017  . Chronic knee pain (Primary Area of Pain) (Bilateral) (L>R) 11/05/2017  . Chronic wrist pain (Secondary Area of Pain) (Left) 11/05/2017  . Chronic constipation 08/18/2017  . Hyperlipidemia 05/12/2017  . COPD without exacerbation (HCC) 05/07/2017  . Male hypogonadism 04/24/2017  . Post-void dribbling 04/01/2017  . Osteoarthritis of the knee (Left)   . Skin lesions 03/12/2017  . GERD (gastroesophageal reflux disease) 03/12/2017  . Bucket handle tear of meniscus of right knee   . Anxiety and depression  10/07/2015  . Morbid obesity (HCC) 10/06/2015  . OSA (obstructive sleep apnea) 08/31/2014  . Testosterone deficiency 07/01/2014  . S/P knee surgery 04/26/2014  . Acute meniscal tear of left knee 04/26/2014  . Medial meniscus, posterior horn derangement 04/22/2014  . Intrinsic asthma 12/19/2013  . Chronic pain syndrome 05/13/2013  . Diabetes (HCC) 05/13/2013  . Mediastinal abnormality 04/10/2012  . Cigarette smoker 04/10/2012    Past Surgical History:  Procedure Laterality Date  . CARPAL TUNNEL RELEASE Left   . CATARACT EXTRACTION W/PHACO Right 03/11/2016   Procedure: CATARACT EXTRACTION PHACO AND INTRAOCULAR LENS PLACEMENT (IOC);  Surgeon: Gemma Payor, MD;  Location: [facility];  Service: Ophthalmology;  Laterality: Right;  CDE 4.24  . HERNIA REPAIR Bilateral   . KNEE ARTHROSCOPY WITH MEDIAL MENISECTOMY Left 04/22/2014   Procedure: LEFT KNEE ARTHROSCOPY WITH MEDIAL MENISECTOMY;  Surgeon: Vickki Hearing, MD;  Location: [facility];  Service: Orthopedics;  Laterality: Left;  . KNEE ARTHROSCOPY WITH MEDIAL MENISECTOMY Right 12/06/2015   Procedure: RIGHT KNEE ARTHROSCOPY WITH MEDIAL MENISECTOMY;  Surgeon: Vickki Hearing, MD;  Location: [facility];  Service: Orthopedics;  Laterality: Right;  . KNEE ARTHROSCOPY WITH MEDIAL MENISECTOMY Left 03/20/2017   Procedure: KNEE ARTHROSCOPY WITH MEDIAL MENISECTOMY;  Surgeon: Vickki Hearing, MD;  Location: [facility];  Service: Orthopedics;  Laterality: Left;  . ORIF WRIST FRACTURE Left 12/19/2016   Procedure: OPEN REDUCTION INTERNAL FIXATION (ORIF) WRIST FRACTURE;  Surgeon: Betha Loa, MD;  Location: [facility]  [place];  Service: Orthopedics;  Laterality: Left;  accumed   . SHOULDER ARTHROCENTESIS Right   . VASECTOMY          Home Medications    Prior to Admission medications   Medication Sig Start Date End Date Taking? Authorizing Provider  ANDROGEL PUMP 20.25 MG/ACT (1.62%) GEL Apply 2 Pump topically daily.  02/25/18  Yes [provider]    atorvastatin (LIPITOR) 20 MG tablet Take 1 tablet (20 mg total) by mouth every evening. 05/12/17  Yes Doreene Nest, NP  betamethasone dipropionate (DIPROLENE) 0.05 % cream Apply topically 2 (two) times daily as needed.  03/05/18  Yes [provider]  buPROPion (WELLBUTRIN SR) 150 MG 12 hr tablet TAKE ONE TABLET BY MOUTH TWICE A DAY 02/25/18  Yes Doreene Nest, NP  celecoxib (CELEBREX) 200 MG capsule Take 200 mg by mouth 2 (two) times daily.    Yes [provider]  ciclopirox (LOPROX) 0.77 % cream Apply topically 2 (two) times daily. 12/03/17  Yes Doreene Nest, NP  citalopram (CELEXA) 40 MG tablet TAKE ONE (1) TABLET BY MOUTH EVERY DAY 04/10/17  Yes Doreene Nest, NP  empagliflozin (JARDIANCE) 10 MG TABS tablet Take 10 mg by mouth every morning. 09/09/17  Yes Doreene Nest, NP  fluticasone furoate-vilanterol (BREO ELLIPTA) 200-25 MCG/INH AEPB Inhale 1 puff into the lungs daily. 05/23/17  Yes Nyoka Cowden, MD  Ipratropium-Albuterol (COMBIVENT) 20-100 MCG/ACT AERS respimat Inhale 1 puff into the lungs every 6 (six) hours as needed for wheezing. 05/23/17  Yes Nyoka Cowden, MD  lansoprazole (PREVACID) 30 MG capsule TAKE ONE CAPSULE BY MOUTH TWICE A DAY BEFORE A MEAL 01/22/18  Yes Doreene Nest, NP  metFORMIN (GLUCOPHAGE-XR) 500 MG 24 hr tablet TAKE 1 TABLET DAILY WITH BREAKFAST FOR 2WEEKS, THEN ADVANCE TO 2 TABLETS WITH BREAKFAST THEREAFTER Patient taking differently: Take 1,000 mg by mouth daily with breakfast.  11/14/17  Yes Doreene Nest, NP  naloxegol oxalate (MOVANTIK) 25 MG TABS tablet Take 1 tablet (25 mg total) by mouth daily. Take on an empty stomach at least 1 hour before or 2 hours after a meal. 03/10/18 04/09/18 Yes Barbette Merino, NP  tamsulosin (FLOMAX) 0.4 MG CAPS capsule TAKE ONE (1) CAPSULE EACH DAY 11/14/17  Yes Doreene Nest, NP  tapentadol (NUCYNTA) 50 MG tablet Take 1 tablet (50 mg total) by mouth every 8 (eight) hours as needed for  severe pain. Do not take if you have epilepsy or a history of seizures. Swallow tablets whole. Do not chew, crush or dissolve. 03/13/18 04/12/18 Yes Barbette Merino, NP  traZODone (DESYREL) 150 MG tablet Take 1 tablet (150 mg total) by mouth at bedtime. 11/20/17  Yes Doreene Nest, NP  albuterol (PROVENTIL) (2.5 MG/3ML) 0.083% nebulizer solution USE 1 VIAL VIA NEBULIZER FOUR TIMES DAILY AS NEEDED FOR SHORTNESS OF BREATH 12/31/17   Nyoka Cowden, MD  amoxicillin (AMOXIL) 875 MG tablet Take 1 tablet (875 mg total) by mouth 2 (two) times daily. Patient not taking: Reported on 03/10/2018 02/20/18   Doreene Nest, NP  CINNAMON PO Take 2,800 mg by mouth daily. EACH TABLET IS 1400 MG.  TAKES 2 CAPSULES DAILY    [provider]  Continuous Blood Gluc Receiver (FREESTYLE LIBRE 14 DAY READER) DEVI 1 Device by Does not apply route 3 (three) times daily. 10/21/17   Doreene Nest, NP  Continuous Blood Gluc Sensor (FREESTYLE LIBRE 14 DAY SENSOR) MISC  1 Device by Does not apply route 3 (three) times daily. 10/21/17   Doreene Nestlark, Katherine K, NP  diphenhydrAMINE (BENADRYL) 25 MG tablet Take 25 mg by mouth 2 (two) times daily.    [provider]  glipiZIDE (GLUCOTROL) 5 MG tablet TAKE TWO TABLETS BY MOUTH TWICE DAILY 10/07/17   Doreene Nestlark, Katherine K, NP  ipratropium (ATROVENT) 0.02 % nebulizer solution USE 1 VIAL IN NEBULIZER FOUR TIMES DAILY AS NEEDED 04/21/17   Doreene Nestlark, Katherine K, NP  lubiprostone (AMITIZA) 24 MCG capsule Take 1 capsule (24 mcg total) by mouth 2 (two) times daily. Patient not taking: Reported on 03/18/2018 02/11/18 05/12/18  Barbette MerinoKing, Crystal M, NP  Multiple Vitamin (MULTIVITAMIN WITH MINERALS) TABS Take 1 tablet by mouth every morning. Men's once daily multivitamin packet (vitamin e, calcium, ginseng)    [provider]  Saw Palmetto, Serenoa repens, (SAW PALMETTO PO) Take 1 capsule by mouth daily.    [provider]  tapentadol (NUCYNTA) 50 MG 12 hr tablet Take 1  tablet (50 mg total) by mouth every 8 (eight) hours as needed. Do not take if you have epilepsy or a history of seizures. Swallow tablets whole. Do not chew, crush or dissolve. 04/12/18 05/12/18  Barbette MerinoKing, Crystal M, NP    Family History Family History  Problem Relation Age of Onset  . Emphysema Maternal Grandfather   . Emphysema Maternal Grandmother   . Clotting disorder Maternal Grandmother     Social History Social History   Tobacco Use  . Smoking status: Current Every Day Smoker    Packs/day: 1.50    Years: 20.00    Pack years: 30.00    Types: Cigarettes    Start date: 12/17/1993  . Smokeless tobacco: Former NeurosurgeonUser  . Tobacco comment: 1ppd as of 11/06/17 ep  Substance Use Topics  . Alcohol use: No    Alcohol/week: 0.0 oz  . Drug use: No     Allergies   Azithromycin; Clarithromycin; Erythromycin; Keflex [cephalexin]; Metformin; and Symbicort [budesonide-formoterol fumarate]   Review of Systems Review of Systems  Unable to perform ROS: Acuity of condition     Physical Exam Updated Vital Signs BP (!) 132/94   Pulse 92   Resp 14   Ht 5\' 10"  (1.778 m)   Wt 106.1 kg (234 lb)   SpO2 96%   BMI 33.58 kg/m   Physical Exam  Constitutional: He is oriented to person, place, and time. He appears well-developed and well-nourished.  HENT:  Head: Normocephalic and atraumatic.  Eyes: Conjunctivae are normal.  Neck: Neck supple.  Cardiovascular: Normal rate and regular rhythm.  Pulmonary/Chest: Effort normal and breath sounds normal.  Abdominal: Soft. Bowel sounds are normal.  Musculoskeletal: Normal range of motion.  Neurological: He is alert and oriented to person, place, and time.  Skin: Skin is warm and dry.  Psychiatric: He has a normal mood and affect. His behavior is normal.  Nursing note and vitals reviewed.    ED Treatments / Results  Labs (all labs ordered are listed, but only abnormal results are displayed) Labs Reviewed  COMPREHENSIVE METABOLIC PANEL -  Abnormal; Notable for the following components:      Result Value   Potassium 3.3 (*)    Glucose, Bld 231 (*)    AST 14 (*)    ALT 14 (*)    All other components within normal limits  ACETAMINOPHEN LEVEL - Abnormal; Notable for the following components:   Acetaminophen (Tylenol), Serum <10 (*)    All other  components within normal limits  CBC WITH DIFFERENTIAL/PLATELET  ETHANOL  SALICYLATE LEVEL  RAPID URINE DRUG SCREEN, HOSP PERFORMED    EKG EKG Interpretation  Date/Time:  Tuesday March 24 2018 17:34:14 EDT Ventricular Rate:  108 PR Interval:    QRS Duration: 100 QT Interval:  349 QTC Calculation: 468 R Axis:   51 Text Interpretation:  Sinus tachycardia Confirmed by Donnetta Hutching ([id number]) on 03/24/2018 7:33:48 PM   Radiology No results found.  Procedures Procedures (including critical care time)  Medications Ordered in ED Medications  naloxone (NARCAN) 4 mg in dextrose 5 % 250 mL infusion (5 mg/hr Intravenous New Bag/Given 03/24/18 1929)  naloxone [facility]) 2 MG/2ML injection (has no administration in time range)  sodium chloride 0.9 % bolus 1,000 mL (0 mLs Intravenous Stopped 03/24/18 1833)  naloxone [facility]) injection 0.4 mg (0.4 mg Intravenous Given 03/24/18 1748)  naloxone [facility]) injection 0.4 mg (0.4 mg Intravenous Given 03/24/18 1801)  naloxone [facility]) injection 0.4 mg (0.4 mg Intravenous Given 03/24/18 1818)  sodium chloride 0.9 % bolus 1,000 mL (1,000 mLs Intravenous New Bag/Given 03/24/18 1833)     Initial Impression / Assessment and Plan / ED Course  I have reviewed the triage vital signs and the nursing notes.  Pertinent labs & imaging results that were available during my care of the patient were reviewed by me and considered in my medical decision making (see chart for details).     Patient presents with an accidental ingestion of multiple tablets of Nucynta.  I initiated Narcan in small aliquots initially, but then converted to a Narcan drip.  This is seem  to help the patient dramatically.  He is somnolent but easily awakened.  His airway is intact.  Glucose minimally elevated.  Discussed with poison control and pharmacist.  Admit to hospitalist.  Discussions with family members.   CRITICAL CARE Performed by: Donnetta Hutching  ?  Total critical care time: 60 minutes  Critical care time was exclusive of separately billable procedures and treating other patients.  Critical care was necessary to treat or prevent imminent or life-threatening deterioration.  Critical care was time spent personally by me on the following activities: development of treatment plan with patient and/or surrogate as well as nursing, discussions with consultants, evaluation of patient's response to treatment, examination of patient, obtaining history from patient or surrogate, ordering and performing treatments and interventions, ordering and review of laboratory studies, ordering and review of radiographic studies, pulse oximetry and re-evaluation of patient's condition.  Final Clinical Impressions(s) / ED Diagnoses   Final diagnoses:  Opiate overdose, accidental or unintentional, initial encounter [facility])    ED Discharge Orders    None       Donnetta Hutching, MD 03/24/18 1949    Donnetta Hutching, MD 03/24/18 [phone]

## 2018-03-25 DIAGNOSIS — G894 Chronic pain syndrome: Secondary | ICD-10-CM

## 2018-03-25 DIAGNOSIS — J449 Chronic obstructive pulmonary disease, unspecified: Secondary | ICD-10-CM

## 2018-03-25 DIAGNOSIS — K219 Gastro-esophageal reflux disease without esophagitis: Secondary | ICD-10-CM

## 2018-03-25 DIAGNOSIS — T40601D Poisoning by unspecified narcotics, accidental (unintentional), subsequent encounter: Secondary | ICD-10-CM | POA: Diagnosis not present

## 2018-03-25 DIAGNOSIS — E119 Type 2 diabetes mellitus without complications: Secondary | ICD-10-CM

## 2018-03-25 DIAGNOSIS — G9340 Encephalopathy, unspecified: Secondary | ICD-10-CM | POA: Diagnosis not present

## 2018-03-25 DIAGNOSIS — G4733 Obstructive sleep apnea (adult) (pediatric): Secondary | ICD-10-CM | POA: Diagnosis not present

## 2018-03-25 LAB — CBC
HEMATOCRIT: 42.3 % (ref 39.0–52.0)
HEMOGLOBIN: 13.8 g/dL (ref 13.0–17.0)
MCH: 29.9 pg (ref 26.0–34.0)
MCHC: 32.6 g/dL (ref 30.0–36.0)
MCV: 91.8 fL (ref 78.0–100.0)
Platelets: 173 10*3/uL (ref 150–400)
RBC: 4.61 MIL/uL (ref 4.22–5.81)
RDW: 14.2 % (ref 11.5–15.5)
WBC: 15.2 10*3/uL — ABNORMAL HIGH (ref 4.0–10.5)

## 2018-03-25 LAB — COMPREHENSIVE METABOLIC PANEL
ALBUMIN: 3.6 g/dL (ref 3.5–5.0)
ALK PHOS: 67 U/L (ref 38–126)
ALT: 13 U/L — ABNORMAL LOW (ref 17–63)
ANION GAP: 9 (ref 5–15)
AST: 11 U/L — AB (ref 15–41)
BILIRUBIN TOTAL: 0.8 mg/dL (ref 0.3–1.2)
BUN: 8 mg/dL (ref 6–20)
CO2: 26 mmol/L (ref 22–32)
Calcium: 8.3 mg/dL — ABNORMAL LOW (ref 8.9–10.3)
Chloride: 104 mmol/L (ref 101–111)
Creatinine, Ser: 0.73 mg/dL (ref 0.61–1.24)
GFR calc Af Amer: >60 mL/min (ref >60)
GFR calc non Af Amer: >60 mL/min (ref >60)
GLUCOSE: 120 mg/dL — AB (ref 65–99)
POTASSIUM: 3.8 mmol/L (ref 3.5–5.1)
SODIUM: 139 mmol/L (ref 135–145)
Total Protein: 6.1 g/dL — ABNORMAL LOW (ref 6.5–8.1)

## 2018-03-25 LAB — HEMOGLOBIN A1C
Hgb A1c MFr Bld: 6.7 % — ABNORMAL HIGH (ref 4.8–5.6)
Mean Plasma Glucose: 145.59 mg/dL

## 2018-03-25 LAB — GLUCOSE, CAPILLARY
Glucose-Capillary: 126 mg/dL — ABNORMAL HIGH (ref 65–99)
Glucose-Capillary: 161 mg/dL — ABNORMAL HIGH (ref 65–99)
Glucose-Capillary: 177 mg/dL — ABNORMAL HIGH (ref 65–99)

## 2018-03-25 MED ORDER — METFORMIN HCL ER 500 MG PO TB24: 1000.0000 mg | ORAL_TABLET | Freq: Every day | ORAL | Status: DC

## 2018-03-25 MED ORDER — INSULIN ASPART 100 UNIT/ML ~~LOC~~ SOLN
0.0000 [IU] | Freq: Three times a day (TID) | SUBCUTANEOUS | Status: DC
  Administered 2018-03-25 (×2): 4 [IU] via SUBCUTANEOUS

## 2018-03-25 NOTE — Progress Notes (Signed)
Patient off BIPAP at this time doing well. Awake and alert. Does not want to go back on unless he needs it or goes to sleep. Will monitor.

## 2018-03-25 NOTE — Discharge Summary (Signed)
Physician Discharge Summary  Joseph [name]:[id number] DOB: May 07, 1971 DOA: 03/24/2018  PCP: Doreene Nest, NP  Admit date: 03/24/2018 Discharge date: 03/25/2018  Admitted From: HOME  Disposition: HOME   Recommendations for Outpatient Follow-up:  1. Follow up with PCP in 3-5 days for recheck 2. DO NOT DRIVE OR OPERATE MACHINERY   Discharge Condition: STABLE   CODE STATUS: FULL    Brief Hospitalization Summary: Please see all hospital notes, images, labs for full details of the hospitalization.  HPI: Joseph [name] is a 47 y.o. male with medical history significant for COPD with ongoing tobacco abuse, obstructive sleep apnea on CPAP, chronic pain syndrome with opiate use, GERD, and type 2 diabetes who was driving to [place] earlier today when he accidentally ingested what appears to be approximately 50 tablets of 50mg  Nucynta.  He usually keeps multiple medications in a single bottle and apparently picked up the wrong bottle and realized that he had taken a handful of the wrong medication shortly after he ingested them.  He immediately turned around and came to the emergency department and actually arrived alert and awake and started to become quite somnolent.  Patient cannot give much history on account of his current condition but there appears to be no intent to harm himself.   ED Course: Vital signs were noted to be stable and laboratory data is only remarkable for potassium of 3.3 and glucose of 231.  ED physician had given multiple doses of Narcan with temporary improvement in alertness and therefore, has placed him on a Narcan drip.  He appears able to protect his airway and is currently on 2 L nasal cannula with oxygen saturation in the high 80th percentile.  Poison control recommends keeping on Narcan drip until alertness improves and monitoring labs as well as repeat EKG in a.m.  He is currently arousable and will answer some questions appropriately.  1. Acute encephalopathy  secondary to unintentional opiate overdose.  RESOLVED NOW, BACK TO BASELINE.  He was monitored in stepdown unit with close neurochecks and Narcan drip until further awake and alert.  Avoid any current sedating medications. He was restarted on diet after he recovered and naloxone infusion was discontinued.  He was monitored for additional 7 hours after naloxone was discontinued.   2. Mild hypokalemia.  Repleted with IV. 3. Hyperglycemia with history of type 2 diabetes.  Resume home oral medications.  4. Obstructive sleep apnea.  Maintained on CPAP. 5. COPD with ongoing tobacco abuse.  Duo nebs every 6 hours as needed as needed for shortness of breath or wheezing and nicotine patch.  Encouraged cessation of tobacco.  6. Chronic pain with lumbago and osteoarthritis of knee.  Held all narcotics.  Resume home nucynta at DC but no driving or operating machinery.     DVT prophylaxis: Lovenox Code Status: Full Family Communication: Fianc and daughter at bedside Disposition Plan: Home  Consults called: None Admission status: Observation, stepdown unit  Discharge Diagnoses:  Principal Problem:   Acute encephalopathy Active Problems:   Chronic pain syndrome   Diabetes (HCC)   OSA (obstructive sleep apnea)   GERD (gastroesophageal reflux disease)   COPD without exacerbation (HCC)   Opiate overdose (HCC)   Encephalopathy acute  Discharge Instructions: Discharge Instructions    Call MD for:  difficulty breathing, headache or visual disturbances   Complete by:  As directed    Call MD for:  extreme fatigue   Complete by:  As directed    Call MD  for:  persistant dizziness or light-headedness   Complete by:  As directed    Call MD for:  persistant nausea and vomiting   Complete by:  As directed    Increase activity slowly   Complete by:  As directed      Allergies as of 03/25/2018      Reactions   Azithromycin Hives   Clarithromycin    Erythromycin Hives   Keflex [cephalexin] Nausea  Only   Metformin Nausea And Vomiting   Symbicort [budesonide-formoterol Fumarate] Other (See Comments)   States that 3 doses were used and breathing became worse      Medication List    STOP taking these medications   amoxicillin 875 MG tablet Commonly known as:  AMOXIL   lubiprostone 24 MCG capsule Commonly known as:  AMITIZA     TAKE these medications   albuterol (2.5 MG/3ML) 0.083% nebulizer solution Commonly known as:  PROVENTIL USE 1 VIAL VIA NEBULIZER FOUR TIMES DAILY AS NEEDED FOR SHORTNESS OF BREATH   ANDROGEL PUMP 20.25 MG/ACT (1.62%) Gel Generic drug:  Testosterone Apply 2 Pump topically daily.   atorvastatin 20 MG tablet Commonly known as:  LIPITOR Take 1 tablet (20 mg total) by mouth every evening.   betamethasone dipropionate 0.05 % cream Commonly known as:  DIPROLENE Apply topically 2 (two) times daily as needed.   buPROPion 150 MG 12 hr tablet Commonly known as:  WELLBUTRIN SR TAKE ONE TABLET BY MOUTH TWICE A DAY   celecoxib 200 MG capsule Commonly known as:  CELEBREX Take 200 mg by mouth 2 (two) times daily.   ciclopirox 0.77 % cream Commonly known as:  LOPROX Apply topically 2 (two) times daily.   CINNAMON PO Take 2,800 mg by mouth daily. EACH TABLET IS 1400 MG.  TAKES 2 CAPSULES DAILY   citalopram 40 MG tablet Commonly known as:  CELEXA TAKE ONE (1) TABLET BY MOUTH EVERY DAY   diphenhydrAMINE 25 MG tablet Commonly known as:  BENADRYL Take 25 mg by mouth 2 (two) times daily.   empagliflozin 10 MG Tabs tablet Commonly known as:  JARDIANCE Take 10 mg by mouth every morning.   fluticasone furoate-vilanterol 200-25 MCG/INH Aepb Commonly known as:  BREO ELLIPTA Inhale 1 puff into the lungs daily.   glipiZIDE 5 MG tablet Commonly known as:  GLUCOTROL TAKE TWO TABLETS BY MOUTH TWICE DAILY   ipratropium 0.02 % nebulizer solution Commonly known as:  ATROVENT USE 1 VIAL IN NEBULIZER FOUR TIMES DAILY AS NEEDED   Ipratropium-Albuterol  20-100 MCG/ACT Aers respimat Commonly known as:  COMBIVENT Inhale 1 puff into the lungs every 6 (six) hours as needed for wheezing.   lansoprazole 30 MG capsule Commonly known as:  PREVACID TAKE ONE CAPSULE BY MOUTH TWICE A DAY BEFORE A MEAL   metFORMIN 500 MG 24 hr tablet Commonly known as:  GLUCOPHAGE-XR Take 2 tablets (1,000 mg total) by mouth daily with breakfast. Start taking on:  03/26/2018   multivitamin with minerals Tabs tablet Take 1 tablet by mouth every morning. Men's once daily multivitamin packet (vitamin e, calcium, ginseng)   naloxegol oxalate 25 MG Tabs tablet Commonly known as:  MOVANTIK Take 1 tablet (25 mg total) by mouth daily. Take on an empty stomach at least 1 hour before or 2 hours after a meal.   SAW PALMETTO PO Take 1 capsule by mouth daily.   tamsulosin 0.4 MG Caps capsule Commonly known as:  FLOMAX TAKE ONE (1) CAPSULE EACH DAY   tapentadol  50 MG 12 hr tablet Commonly known as:  NUCYNTA Take 1 tablet (50 mg total) by mouth every 8 (eight) hours as needed. Do not take if you have epilepsy or a history of seizures. Swallow tablets whole. Do not chew, crush or dissolve. Start taking on:  04/12/2018 What changed:  Another medication with the same name was removed. Continue taking this medication, and follow the directions you see here.   traZODone 150 MG tablet Commonly known as:  DESYREL Take 1 tablet (150 mg total) by mouth at bedtime.      Follow-up Information    Doreene Nestlark, Katherine K, NP. Schedule an appointment as soon as possible for a visit in 5 day(s).   Specialty:  Internal Medicine Contact information: [address] [place] [id number] [phone]          Allergies  Allergen Reactions  . Azithromycin Hives  . Clarithromycin   . Erythromycin Hives  . Keflex [Cephalexin] Nausea Only  . Metformin Nausea And Vomiting  . Symbicort [Budesonide-Formoterol Fumarate] Other (See Comments)    States that 3 doses were used and  breathing became worse   Allergies as of 03/25/2018      Reactions   Azithromycin Hives   Clarithromycin    Erythromycin Hives   Keflex [cephalexin] Nausea Only   Metformin Nausea And Vomiting   Symbicort [budesonide-formoterol Fumarate] Other (See Comments)   States that 3 doses were used and breathing became worse      Medication List    STOP taking these medications   amoxicillin 875 MG tablet Commonly known as:  AMOXIL   lubiprostone 24 MCG capsule Commonly known as:  AMITIZA     TAKE these medications   albuterol (2.5 MG/3ML) 0.083% nebulizer solution Commonly known as:  PROVENTIL USE 1 VIAL VIA NEBULIZER FOUR TIMES DAILY AS NEEDED FOR SHORTNESS OF BREATH   ANDROGEL PUMP 20.25 MG/ACT (1.62%) Gel Generic drug:  Testosterone Apply 2 Pump topically daily.   atorvastatin 20 MG tablet Commonly known as:  LIPITOR Take 1 tablet (20 mg total) by mouth every evening.   betamethasone dipropionate 0.05 % cream Commonly known as:  DIPROLENE Apply topically 2 (two) times daily as needed.   buPROPion 150 MG 12 hr tablet Commonly known as:  WELLBUTRIN SR TAKE ONE TABLET BY MOUTH TWICE A DAY   celecoxib 200 MG capsule Commonly known as:  CELEBREX Take 200 mg by mouth 2 (two) times daily.   ciclopirox 0.77 % cream Commonly known as:  LOPROX Apply topically 2 (two) times daily.   CINNAMON PO Take 2,800 mg by mouth daily. EACH TABLET IS 1400 MG.  TAKES 2 CAPSULES DAILY   citalopram 40 MG tablet Commonly known as:  CELEXA TAKE ONE (1) TABLET BY MOUTH EVERY DAY   diphenhydrAMINE 25 MG tablet Commonly known as:  BENADRYL Take 25 mg by mouth 2 (two) times daily.   empagliflozin 10 MG Tabs tablet Commonly known as:  JARDIANCE Take 10 mg by mouth every morning.   fluticasone furoate-vilanterol 200-25 MCG/INH Aepb Commonly known as:  BREO ELLIPTA Inhale 1 puff into the lungs daily.   glipiZIDE 5 MG tablet Commonly known as:  GLUCOTROL TAKE TWO TABLETS BY MOUTH TWICE  DAILY   ipratropium 0.02 % nebulizer solution Commonly known as:  ATROVENT USE 1 VIAL IN NEBULIZER FOUR TIMES DAILY AS NEEDED   Ipratropium-Albuterol 20-100 MCG/ACT Aers respimat Commonly known as:  COMBIVENT Inhale 1 puff into the lungs every 6 (six) hours  as needed for wheezing.   lansoprazole 30 MG capsule Commonly known as:  PREVACID TAKE ONE CAPSULE BY MOUTH TWICE A DAY BEFORE A MEAL   metFORMIN 500 MG 24 hr tablet Commonly known as:  GLUCOPHAGE-XR Take 2 tablets (1,000 mg total) by mouth daily with breakfast. Start taking on:  03/26/2018   multivitamin with minerals Tabs tablet Take 1 tablet by mouth every morning. Men's once daily multivitamin packet (vitamin e, calcium, ginseng)   naloxegol oxalate 25 MG Tabs tablet Commonly known as:  MOVANTIK Take 1 tablet (25 mg total) by mouth daily. Take on an empty stomach at least 1 hour before or 2 hours after a meal.   SAW PALMETTO PO Take 1 capsule by mouth daily.   tamsulosin 0.4 MG Caps capsule Commonly known as:  FLOMAX TAKE ONE (1) CAPSULE EACH DAY   tapentadol 50 MG 12 hr tablet Commonly known as:  NUCYNTA Take 1 tablet (50 mg total) by mouth every 8 (eight) hours as needed. Do not take if you have epilepsy or a history of seizures. Swallow tablets whole. Do not chew, crush or dissolve. Start taking on:  04/12/2018 What changed:  Another medication with the same name was removed. Continue taking this medication, and follow the directions you see here.   traZODone 150 MG tablet Commonly known as:  DESYREL Take 1 tablet (150 mg total) by mouth at bedtime.       Procedures/Studies:  No results found.   Subjective: Pt without complaints.  He is awake, alert, oriented.    Discharge Exam: Vitals:   03/25/18 0900 03/25/18 1000  BP: 133/86 124/88  Pulse: 75 73  Resp: 15 17  Temp:    SpO2: 99% 98%   Vitals:   03/25/18 0700 03/25/18 0800 03/25/18 0900 03/25/18 1000  BP: (!) 132/100 (!) 138/94 133/86 124/88   Pulse:   75 73  Resp: 19 17 15 17   Temp:  98.1 F (36.7 C)    TempSrc:  Oral    SpO2:   99% 98%  Weight:      Height:        General: Pt is alert, awake, not in acute distress Cardiovascular: RRR, S1/S2 +, no rubs, no gallops Respiratory: CTA bilaterally, no wheezing, no rhonchi Abdominal: Soft, NT, ND, bowel sounds + Extremities: no edema, no cyanosis   The results of significant diagnostics from this hospitalization (including imaging, microbiology, ancillary and laboratory) are listed below for reference.     Microbiology: Recent Results (from the past 240 hour(s))  MRSA PCR Screening     Status: None   Collection Time: 03/24/18  9:57 PM  Result Value Ref Range Status   MRSA by PCR NEGATIVE NEGATIVE Final    Comment:        The GeneXpert MRSA Assay (FDA approved for NASAL specimens only), is one component of a comprehensive MRSA colonization surveillance program. It is not intended to diagnose MRSA infection nor to guide or monitor treatment for MRSA infections. Performed at [facility], [address] [place], [place] [address]      Labs: BNP (last 3 results) No results for input(s): BNP in the last 8760 hours. Basic Metabolic Panel: Recent Labs  Lab 03/24/18 1758 03/25/18 0354  NA 140 139  K 3.3* 3.8  CL 103 104  CO2 24 26  GLUCOSE 231* 120*  BUN 7 8  CREATININE 0.80 0.73  CALCIUM 9.1 8.3*   Liver Function Tests: Recent Labs  Lab 03/24/18 1758  03/25/18 0354  AST 14* 11*  ALT 14* 13*  ALKPHOS 77 67  BILITOT 0.8 0.8  PROT 7.1 6.1*  ALBUMIN 4.2 3.6   No results for input(s): LIPASE, AMYLASE in the last 168 hours. No results for input(s): AMMONIA in the last 168 hours. CBC: Recent Labs  Lab 03/24/18 1758 03/25/18 0354  WBC 10.1 15.2*  NEUTROABS 6.7  --   HGB 15.1 13.8  HCT 44.6 42.3  MCV 89.6 91.8  PLT 203 173   Cardiac Enzymes: No results for input(s): CKTOTAL, CKMB, CKMBINDEX, TROPONINI in the last 168 hours. BNP: Invalid  input(s): POCBNP CBG: Recent Labs  Lab 03/24/18 2341 03/25/18 0400 03/25/18 0741  GLUCAP 137* 126* 161*   D-Dimer No results for input(s): DDIMER in the last 72 hours. Hgb A1c No results for input(s): HGBA1C in the last 72 hours. Lipid Profile No results for input(s): CHOL, HDL, LDLCALC, TRIG, CHOLHDL, LDLDIRECT in the last 72 hours. Thyroid function studies No results for input(s): TSH, T4TOTAL, T3FREE, THYROIDAB in the last 72 hours.  Invalid input(s): FREET3 Anemia work up No results for input(s): VITAMINB12, FOLATE, FERRITIN, TIBC, IRON, RETICCTPCT in the last 72 hours. Urinalysis    Component Value Date/Time   COLORURINE YELLOW 03/20/2015 1250   APPEARANCEUR HAZY (A) 03/20/2015 1250   LABSPEC 1.025 03/20/2015 1250   PHURINE 5.5 03/20/2015 1250   GLUCOSEU 250 (A) 03/20/2015 1250   HGBUR NEGATIVE 03/20/2015 1250   BILIRUBINUR negative 04/01/2017 0956   KETONESUR NEGATIVE 03/20/2015 1250   PROTEINUR negative 04/01/2017 0956   PROTEINUR NEGATIVE 03/20/2015 1250   UROBILINOGEN negative (A) 04/01/2017 0956   UROBILINOGEN 0.2 03/20/2015 1250   NITRITE negative 04/01/2017 0956   NITRITE NEGATIVE 03/20/2015 1250   LEUKOCYTESUR Negative 04/01/2017 0956   Sepsis Labs Invalid input(s): PROCALCITONIN,  WBC,  LACTICIDVEN Microbiology Recent Results (from the past 240 hour(s))  MRSA PCR Screening     Status: None   Collection Time: 03/24/18  9:57 PM  Result Value Ref Range Status   MRSA by PCR NEGATIVE NEGATIVE Final    Comment:        The GeneXpert MRSA Assay (FDA approved for NASAL specimens only), is one component of a comprehensive MRSA colonization surveillance program. It is not intended to diagnose MRSA infection nor to guide or monitor treatment for MRSA infections. Performed at [facility], [address], [place], [place] [address]     Time coordinating discharge:   SIGNED:  Standley Dakins, MD  [facility] 03/25/2018, 11:19 AM Pager [phone]  [phone]  If 7PM-7AM, please contact night-coverage [email or web address] Password [email or web address]

## 2018-03-25 NOTE — Discharge Instructions (Signed)
No driving or operating machinery until cleared by Primary Provider.   Follow with Primary MD  Joseph [name], Katherine K, NP  and other consultant's as instructed your Hospitalist MD  Please get a complete blood count and chemistry panel checked by your Primary MD at your next visit, and again as instructed by your Primary MD.  Get Medicines reviewed and adjusted: Please take all your medications with you for your next visit with your Primary MD  Laboratory/radiological data: Please request your Primary MD to go over all hospital tests and procedure/radiological results at the follow up, please ask your Primary MD to get all Hospital records sent to his/her office.  In some cases, they will be blood work, cultures and biopsy results pending at the time of your discharge. Please request that your primary care M.D. follows up on these results.  Also Note the following: If you experience worsening of your admission symptoms, develop shortness of breath, life threatening emergency, suicidal or homicidal thoughts you must seek medical attention immediately by calling 911 or calling your MD immediately  if symptoms less severe.  You must read complete instructions/literature along with all the possible adverse reactions/side effects for all the Medicines you take and that have been prescribed to you. Take any new Medicines after you have completely understood and accpet all the possible adverse reactions/side effects.   Do not drive when taking Pain medications or sleeping medications (Benzodaizepines)  Do not take more than prescribed Pain, Sleep and Anxiety Medications. It is not advisable to combine anxiety,sleep and pain medications without talking with your primary care practitioner  Special Instructions: If you have smoked or chewed Tobacco  in the last 2 yrs please stop smoking, stop any regular Alcohol  and or any Recreational drug use.  Wear Seat belts while driving.  Please note: You were  cared for by a hospitalist during your hospital stay. Once you are discharged, your primary care physician will handle any further medical issues. Please note that NO REFILLS for any discharge medications will be authorized once you are discharged, as it is imperative that you return to your primary care physician (or establish a relationship with a primary care physician if you do not have one) for your post hospital discharge needs so that they can reassess your need for medications and monitor your lab values.      Opioid Overdose Opioids are substances that relieve pain by binding to pain receptors in your brain and spinal cord. Opioids include illegal drugs, such as heroin, as well as prescription pain medicines.An opioid overdose happens when you take too much of an opioid substance. This can happen with any type of opioid, including:  Heroin.  Morphine.  Codeine.  Methadone.  Oxycodone.  Hydrocodone.  Fentanyl.  Hydromorphone.  Buprenorphine.  The effects of an overdose can be mild, dangerous, or even deadly. Opioid overdose is a medical emergency. What are the causes? This condition may be caused by:  Taking too much of an opioid by accident.  Taking too much of an opioid on purpose.  An error made by a health care provider who prescribes a medicine.  An error made by the pharmacist who fills the prescription order.  Using more than one substance that contains opioids at the same time.  Mixing an opioid with a substance that affects your heart, breathing, or blood pressure. These include alcohol, tranquilizers, sleeping pills, illegal drugs, and some over-the-counter medicines.  What increases the risk? This condition is more likely  in:  Children. They may be attracted to colorful pills. Because of a child's small size, even a small amount of a drug can be dangerous.  Elderly people. They may be taking many different drugs. Elderly people may have difficulty  reading labels or remembering when they last took their medicine.  People who take an opioid on a long-term basis.  People who use: ? Illegal drugs. ? Other substances, including alcohol, while using an opioid.  People who have: ? A history of drug or alcohol abuse. ? Certain mental health conditions.  People who take opioids that are not prescribed for them.  What are the signs or symptoms? Symptoms of this condition depend on the type of opioid and the amount that was taken. Common symptoms include:  Sleepiness or difficulty waking from sleep.  Confusion.  Slurred speech.  Slowed breathing and a slow pulse.  Nausea and vomiting.  Abnormally small pupils.  Signs and symptoms that require emergency treatment include:  Cold, clammy, and pale skin.  Blue lips and fingernails.  Vomiting.  Gurgling sounds in the throat.  A pulse that is very slow or difficult to detect.  Breathing that is very slow, noisy, or difficult to detect.  Limp body.  Inability to respond to speech or be awakened from sleep (stupor).  How is this diagnosed? This condition is diagnosed based on your symptoms. It is important to tell your health care provider:  All of the opioidsthat you took.  When you took the opioids.  Whether you were drinking alcohol or using other substances.  Your health care provider will do a physical exam. This exam may include:  Checking and monitoring your heart rate and rhythm, your breathing rate and depth, your temperature, and your blood pressure (vital signs).  Checking for abnormally small pupils.  Measuring oxygen levels in your blood.  You may also have blood tests or urine tests. How is this treated? Supporting your vital signs and your breathing is the first step in treating an opioid overdose. Treatment may also include:  Giving fluids and minerals (electrolytes) through an IV tube.  Inserting a breathing tube (endotracheal tube) in your  airway to help you breathe.  Giving oxygen.  Passing a tube through your nose and into your stomach (NG tube, or nasogastric tube) to wash out your stomach.  Giving medicines that: ? Increase your blood pressure. ? Absorb any opioid that is in your digestive system. ? Reverse the effects of the opioid (naloxone).  Ongoing counseling and mental health support if you intentionally overdosed or used an illegal drug.  Follow these instructions at home:  Take over-the-counter and prescription medicines only as told by your health care provider. Always ask your health care provider about possible side effects and interactions of any new medicine that you start taking.  Keep a list of all of the medicines that you take, including over-the-counter medicines. Bring this list with you to all of your medical visits.  Drink enough fluid to keep your urine clear or pale yellow.  Keep all follow-up visits as told by your health care provider. This is important. How is this prevented?  Get help if you are struggling with: ? Alcohol or drug use. ? Depression or another mental health problem.  Keep the phone number of your local poison control center near your phone or on your cell phone.  Store all medicines in safety containers that are out of the reach of children.  Read the drug inserts  that come with your medicines.  Do not drink alcohol when taking opioids.  Do not use illegal drugs.  Do not take opioid medicines that are not prescribed for you. Contact a health care provider if:  Your symptoms return.  You develop new symptoms or side effects when you are taking medicines. Get help right away if:  You think that you or someone else may have taken too much of an opioid. The hotline of the [facility] is 5748306170.  You or someone else is having symptoms of an opioid overdose.  You have serious thoughts about hurting yourself or others.  You  have: ? Chest pain. ? Difficulty breathing. ? A loss of consciousness. Opioid overdose is an emergency. Do not wait to see if the symptoms will go away. Get medical help right away. Call your local emergency services (911 in the [place] Do not drive yourself to the hospital. This information is not intended to replace advice given to you by your health care provider. Make sure you discuss any questions you have with your health care provider. Document Released: 12/26/2004 Document Revised: 04/25/2016 Document Reviewed: 05/04/2015 Elsevier Interactive Patient Education  2018 [employer].   What You Need to Know About Poisoning, Adult Poisoning occurs when you eat, drink, touch, or breathe in a harmful substance. Poisoning causes health problems that can range from mild to life-threatening. The health effects of poisoning depend on:  The type of poison.  How long you were exposed to the poison.  How much of the poison you were exposed to.  Poisoning can be accidental or intentional. Most poisonings happen in the home and involve common household products. What are some common household poisons? Many household products can cause poisoning if they are used incorrectly. These products include:  Medicines.  Vitamins and minerals.  Herbal supplements.  Cleaning and laundry products.  Paint.  Weed and insect killers.  Beauty products, such as perfume, hair spray, and fingernail polish.  Alcohol.  Recreational drugs.  Plants, such as philodendron, poinsettia, oleander, castor bean, cactus, and tomato plants.  Batteries.  Automotive products, such as antifreeze.  Gasoline, lighter fluid, and lamp oil.  What should I do after being exposed to a poison? If you are exposed to poison, contact your local poison control center right away. Call [phone] (in the [place] to reach the poison control center for your area. A poison control specialist will often give you directions to  follow over the phone. Directions may include the following:  If the poison is still in your mouth, remove it.  If you ate or drank the poison, drink a small amount of water or milk.  If the poison was a medicine, keep the medicine container.  If the poison was from fumes, get away from the fumes.  If the poison was inhaled, get fresh air as soon as possible.  If the poison got on your clothes or skin, remove any clothing with the poison on it and rinse the skin with water.  If the poison got in your eyes, rinse your eyes with water.  How can poisoning be prevented? Take these steps to help prevent poisoning:  Keep medicines and chemical products in their original containers. Many of these products come in child-safe packaging.  Keep all dangerous household products in locked cabinets and out of reach of children.  Educate othersabout the dangers of possible poisons.  Read labels before using medicines or household products. Leave the original labels  on the containers.  When taking a medicine, always keep a light on so that you can clearly see the medicine, and check the dosage every time.  Close containers tightly after using medicine or chemical products.  Get rid of unneeded and outdated medicines. To get rid of a medicine: ? Do not put medicine in the trash or flush it down the toilet. ? Follow the disposal instructions that are on the medicine label or that came with the medicine. ? Use your communitys drug take-back program to get rid of the medicine. If this option is not available, take the medicine out of the original container and mix it with something that you would not eat, like coffee grounds or kitty litter. Seal the mixture in a bag, can, or other container and throw it away.  Do not mix different household chemicals with each other.  Use protective equipment, such as goggles, masks, or gloves, as needed when using chemicals or cleaners.  Install a carbon  monoxide detector in your home.  Keep the phone number for your local poison control center posted near your phone. Make sure everyone in your household knows where to find the number.  When should I get help? Call your local emergency services (911 in [place] if you have been exposed to poison and you:  Have trouble breathing.  Have chest pain.  Have trouble staying awake.  Have a seizure.  Pass out.  Feel dizzy.  Have severe vomiting or bleeding.  Have a headache that gets worse.  Are less alert than normal.  Have a widespread rash.  Notice a change in your vision.  Have trouble swallowing.  Have severe abdominal pain.  Where to find more information: For more information about poisoning prevention, visit the American Association of Poison Control Centers: www.aapcc.org This information is not intended to replace advice given to you by your health care provider. Make sure you discuss any questions you have with your health care provider. Document Released: 11/04/2012 Document Revised: 07/12/2016 Document Reviewed: 09/06/2015 Elsevier Interactive Patient Education  2018 [employer].   Accidental Overdose An accidental overdose happens when a person accidentally takes too much of a substance, such as a prescription medicine, an illegal drug, or an over-the-counter medicine. The effects of an overdose can be mild, dangerous, or even deadly. What are the causes? This condition is caused by taking too much of a medicine or other substance. It often results from:  Lack of knowledge about a substance.  Using more than one substance at the same time.  An error made by the health care provider who prescribed the substance.  An error made by the pharmacist who fills the prescription order.  A lapse in memory, such as forgetting that you have already taken a dose of medicine.  Suddenly using a substance after a long period of not using it.  Substances that can cause an  accidental overdose include:  Alcohol.  Medicines that treat mental problems (psychotropic medicines).  Pain medicines.  Cocaine.  Heroin.  Multivitamins that contain iron.  What increases the risk? This condition is more likely to occur in:  Children. Children are at increased risk for an overdose even if they are given only a small amount of a substance because of their small size. Children may also be attracted to colorful pills.  Elderly adults. Elderly adults are more likely to overdose because they may be taking many different medicines. They may also have difficulty reading labels or remembering when  they last took their medicine.  People who use illegal drugs.  People who drink alcohol while using drugs or certain medicines.  People with certain mental health conditions.  What are the signs or symptoms? Symptoms of this condition depend on the substance and the amount that was taken. Common symptoms include:  Behavior changes, such as confusion.  Sleepiness.  Weakness.  Slowed breathing.  Nausea and vomiting.  Seizures.  Very large or small eye pupil size.  An overdose can cause a very serious condition in which your blood pressure drops to a low level (shock). Symptoms of shock include:  Cold and clammy skin.  Pale skin.  Blue lips.  Very slow breathing.  Extreme sleepiness.  Severe confusion.  How is this diagnosed? This condition is diagnosed based on your symptoms and a physical exam. You will be asked to tell your health care provider which substances you took and when you took them. During the physical exam your health care provider may check and monitor your heart rate and rhythm, your temperature, and your blood pressure (vital signs). He or she may also check your breathing and oxygen levels. Sometimes tests are also done to diagnose the condition. Tests may include:  Urine tests. These are done to check for substances in your  system.  Blood tests. These may be done to check for: ? Substances in your system. ? Signs of an imbalance in your blood minerals (electrolytes). ? Liver damage. ? Kidney damage. ? An abnormal acid level in your blood.  An electrocardiogram (ECG). This tests is done to monitor electrical activity in your heart.  How is this treated? This condition may need to be treated right away at the hospital. The first step in treatment is supporting your vital signs and your breathing. After that treatment may involve:  Getting fluids and electrolytes through an IV tube.  Having a breathing tube inserted in your airway to help you breathe. The breathing tube is called a endotracheal tube.  Getting medicines. These may include: ? Medicines that absorb any substance that is in your digestive system. ? Medicines that block or reverse the effect of the substance that caused the overdose.  Having your blood filtered through an artificial kidney machine (hemodialysis).  Ongoing counseling and mental health support. This may be provided if you used an illegal drug.  Follow these instructions at home: Medicines  Take over-the-counter and prescription medicines only as told by your health care provider.  Before taking a new medicine, ask your health care provider whether the medicine may cause side effects and whether the medicine might react with other medicines.  Keep a list of all of the medicines that you take, including over-the-counter medicines. Bring this list with you to all of your medical visits. General instructions  Drink enough fluid to keep your urine clear or pale yellow.  If you are working with a counselor or mental health professional, make sure to follow his or her instructions.  Keep all follow-up visits as told by your health care provider. This is important.  Limit alcohol intake to no more than 1 drink a day for nonpregnant women and 2 drinks a day for men. One drink  equals 12 oz of beer, 5 oz of wine, or 1 oz of hard liquor. How is this prevented?  Get help if you are struggling with: ? Alcohol or drug use. ? Depression or another mental health problem.  Keep the phone number of your local poison control  center near your phone or on your cell phone. The hotline of the [facility] is [phone].  Store all medicines in safety containers that are out of the reach of children.  Read the drug inserts that come with your medicines.  Create a system for taking your medicine, such as with a pill box, that will help you avoid taking too much.  Do not drink alcohol while taking medicines unless your health care provider approves.  Do not use illegal drugs.  Do not take medicines that are not prescribed for you.  If you are breastfeeding, talk to your health care provider before taking medicines or other substances. Certain drugs and medicines can be passed through breast milk to your baby. Contact a health care provider if:  Your symptoms return.  You develop new symptoms or side effects after taking a medicine.  You have questions about a possible overdose. Call your local poison control center at [phone]. Get help right away if:  You think that you or someone else may have taken too much of a substance.  You or someone else is having symptoms of an overdose.  You have serious thoughts about hurting yourself or others.  You become confused.  You have chest pain.  You have trouble breathing.  You lose consciousness.  You have a seizure.  You have trouble staying awake. These symptoms may represent a serious problem that is an emergency. Do not wait to see if the symptoms will go away. Get medical help right away. Call your local emergency services (911 in the [place] Do not drive yourself to the hospital. Summary  An accidental overdose happens when a person accidentally takes too much of a medicine or  other substance, such as a prescription medicine, an illegal drug, or an over-the-counter medicine.  The effects of an overdose can be mild, dangerous, or even deadly.  This condition is diagnosed based on your symptoms and a physical exam. You will be asked to tell your health care provider which substances you took and when you took them.  This condition may need to be treated right away at the hospital. This information is not intended to replace advice given to you by your health care provider. Make sure you discuss any questions you have with your health care provider. Document Released: 02/01/2005 Document Revised: 10/31/2016 Document Reviewed: 10/31/2016 Elsevier Interactive Patient Education  2017 [employer]   What You Need to Know About Poisoning, Adult Poisoning occurs when you eat, drink, touch, or breathe in a harmful substance. Poisoning causes health problems that can range from mild to life-threatening. The health effects of poisoning depend on:  The type of poison.  How long you were exposed to the poison.  How much of the poison you were exposed to.  Poisoning can be accidental or intentional. Most poisonings happen in the home and involve common household products. What are some common household poisons? Many household products can cause poisoning if they are used incorrectly. These products include:  Medicines.  Vitamins and minerals.  Herbal supplements.  Cleaning and laundry products.  Paint.  Weed and insect killers.  Beauty products, such as perfume, hair spray, and fingernail polish.  Alcohol.  Recreational drugs.  Plants, such as philodendron, poinsettia, oleander, castor bean, cactus, and tomato plants.  Batteries.  Automotive products, such as antifreeze.  Gasoline, lighter fluid, and lamp oil.  What should I do after being exposed to a poison? If you are exposed to  poison, contact your local poison control center right away. Call  [phone] (in the [place] to reach the poison control center for your area. A poison control specialist will often give you directions to follow over the phone. Directions may include the following:  If the poison is still in your mouth, remove it.  If you ate or drank the poison, drink a small amount of water or milk.  If the poison was a medicine, keep the medicine container.  If the poison was from fumes, get away from the fumes.  If the poison was inhaled, get fresh air as soon as possible.  If the poison got on your clothes or skin, remove any clothing with the poison on it and rinse the skin with water.  If the poison got in your eyes, rinse your eyes with water.  How can poisoning be prevented? Take these steps to help prevent poisoning:  Keep medicines and chemical products in their original containers. Many of these products come in child-safe packaging.  Keep all dangerous household products in locked cabinets and out of reach of children.  Educate othersabout the dangers of possible poisons.  Read labels before using medicines or household products. Leave the original labels on the containers.  When taking a medicine, always keep a light on so that you can clearly see the medicine, and check the dosage every time.  Close containers tightly after using medicine or chemical products.  Get rid of unneeded and outdated medicines. To get rid of a medicine: ? Do not put medicine in the trash or flush it down the toilet. ? Follow the disposal instructions that are on the medicine label or that came with the medicine. ? Use your communitys drug take-back program to get rid of the medicine. If this option is not available, take the medicine out of the original container and mix it with something that you would not eat, like coffee grounds or kitty litter. Seal the mixture in a bag, can, or other container and throw it away.  Do not mix different household chemicals with  each other.  Use protective equipment, such as goggles, masks, or gloves, as needed when using chemicals or cleaners.  Install a carbon monoxide detector in your home.  Keep the phone number for your local poison control center posted near your phone. Make sure everyone in your household knows where to find the number.  When should I get help? Call your local emergency services (911 in [place] if you have been exposed to poison and you:  Have trouble breathing.  Have chest pain.  Have trouble staying awake.  Have a seizure.  Pass out.  Feel dizzy.  Have severe vomiting or bleeding.  Have a headache that gets worse.  Are less alert than normal.  Have a widespread rash.  Notice a change in your vision.  Have trouble swallowing.  Have severe abdominal pain.  Where to find more information: For more information about poisoning prevention, visit the American Association of Poison Control Centers: www.aapcc.org This information is not intended to replace advice given to you by your health care provider. Make sure you discuss any questions you have with your health care provider. Document Released: 11/04/2012 Document Revised: 07/12/2016 Document Reviewed: 09/06/2015 [employer] Interactive Patient Education  2018 [employer].

## 2018-03-26 ENCOUNTER — Telehealth: Payer: Self-pay | Admitting: *Deleted

## 2018-03-26 LAB — HIV ANTIBODY (ROUTINE TESTING W REFLEX): HIV Screen 4th Generation wRfx: NONREACTIVE

## 2018-03-26 NOTE — Telephone Encounter (Signed)
Lm requesting return call to complete TCM and confirm hosp f/u appt  

## 2018-03-27 ENCOUNTER — Ambulatory Visit (INDEPENDENT_AMBULATORY_CARE_PROVIDER_SITE_OTHER): Payer: Medicare Other | Admitting: Primary Care

## 2018-03-27 ENCOUNTER — Encounter: Payer: Self-pay | Admitting: Primary Care

## 2018-03-27 VITALS — BP 138/82 | HR 77 | Temp 98.2°F | Ht 70.0 in | Wt 241.0 lb

## 2018-03-27 DIAGNOSIS — Z09 Encounter for follow-up examination after completed treatment for conditions other than malignant neoplasm: Secondary | ICD-10-CM

## 2018-03-27 DIAGNOSIS — E876 Hypokalemia: Secondary | ICD-10-CM | POA: Diagnosis not present

## 2018-03-27 LAB — BASIC METABOLIC PANEL
BUN: 7 mg/dL (ref 6–23)
CHLORIDE: 102 meq/L (ref 96–112)
CO2: 29 meq/L (ref 19–32)
Calcium: 9.1 mg/dL (ref 8.4–10.5)
Creatinine, Ser: 0.88 mg/dL (ref 0.40–1.50)
GFR: 98.28 mL/min (ref >60.00)
GLUCOSE: 260 mg/dL — AB (ref 70–99)
Potassium: 4 mEq/L (ref 3.5–5.1)
SODIUM: 140 meq/L (ref 135–145)

## 2018-03-27 NOTE — Progress Notes (Signed)
Subjective:    Patient ID: Ginny ForthJerry R Muscarella, male    DOB: 1971/06/06, 47 y.o.   MRN: [id number]  HPI  [name] is a 47 year old male who presents today for hospital follow up.  He presented to [facility] ED on 03/24/18 with reports of accidental overdose of Nucynta. That morning he was running errands and mixed his afternoon medications together into a medicine bottle so he could take them together on the road. He accidentally grabbed his Nucynta bottle instead. He was at a stop sign, looked behind his car as someone honked their horn, he grabbed his medicine bottle and took his medications as usual. The bottle he ended up taking was Nucynta, not his mixture of medications.  During his stay in the ED he was initially alert and interactive, became more drowsy as time passed. He was treated with Narcan in IV doses, then transitioned to a Narcan drip. He was admitted for further observation. His potassium was noted to be low which was replaced via IV. Overnight he resolved and was back to baseline on 03/25/18. Narcan was discontinued overnight. He was discharged home on 03/25/18 with instructions to resume all usual medications.  Since his last visit he's noticed some residual drowsiness, overall feeling better. He is having bowel movements. He's had one Nucyenta since his discharge home. He denies weakness, changes in constipation, dizziness.   Review of Systems  Constitutional: Negative for appetite change and fatigue.  Respiratory: Negative for shortness of breath.   Cardiovascular: Negative for chest pain.  Gastrointestinal:       No changes in constipation   Neurological: Negative for dizziness, weakness and light-headedness.       Past Medical History:  Diagnosis Date  . Anxiety   . Arthritis   . Asthma   . Chronic back pain   . Chronic pain of left knee   . COPD (chronic obstructive pulmonary disease) (HCC)   . Diabetes mellitus   . GERD (gastroesophageal reflux disease)   .  INGUINAL PAIN, LEFT 10/03/2009   Qualifier: Diagnosis of  By: Darrick PennaFields MD, Sandi L   . Shortness of breath   . Sleep apnea      Social History   Socioeconomic History  . Marital status: Legally Separated    Spouse name: Not on file  . Number of children: Not on file  . Years of education: Not on file  . Highest education level: Not on file  Occupational History  . Occupation: disability  Social Needs  . Financial resource strain: Not on file  . Food insecurity:    Worry: Not on file    Inability: Not on file  . Transportation needs:    Medical: Not on file    Non-medical: Not on file  Tobacco Use  . Smoking status: Current Every Day Smoker    Packs/day: 1.50    Years: 20.00    Pack years: 30.00    Types: Cigarettes    Start date: 12/17/1993  . Smokeless tobacco: Former NeurosurgeonUser  . Tobacco comment: 1ppd as of 11/06/17 ep  Substance and Sexual Activity  . Alcohol use: No    Alcohol/week: 0.0 oz  . Drug use: No  . Sexual activity: Yes    Birth control/protection: Surgical  Lifestyle  . Physical activity:    Days per week: Not on file    Minutes per session: Not on file  . Stress: Not on file  Relationships  . Social connections:  Talks on phone: Not on file    Gets together: Not on file    Attends religious service: Not on file    Active member of club or organization: Not on file    Attends meetings of clubs or organizations: Not on file    Relationship status: Not on file  . Intimate partner violence:    Fear of current or ex partner: Not on file    Emotionally abused: Not on file    Physically abused: Not on file    Forced sexual activity: Not on file  Other Topics Concern  . Not on file  Social History Narrative   Recent visit to [facility] in [place] d/t increased pain where he was prescribed percocet 7.5-325 mg for pain.    Past Surgical History:  Procedure Laterality Date  . CARPAL TUNNEL RELEASE Left   . CATARACT EXTRACTION W/PHACO Right 03/11/2016    Procedure: CATARACT EXTRACTION PHACO AND INTRAOCULAR LENS PLACEMENT (IOC);  Surgeon: Gemma Payor, MD;  Location: [facility];  Service: Ophthalmology;  Laterality: Right;  CDE 4.24  . HERNIA REPAIR Bilateral   . KNEE ARTHROSCOPY WITH MEDIAL MENISECTOMY Left 04/22/2014   Procedure: LEFT KNEE ARTHROSCOPY WITH MEDIAL MENISECTOMY;  Surgeon: Vickki Hearing, MD;  Location: [facility];  Service: Orthopedics;  Laterality: Left;  . KNEE ARTHROSCOPY WITH MEDIAL MENISECTOMY Right 12/06/2015   Procedure: RIGHT KNEE ARTHROSCOPY WITH MEDIAL MENISECTOMY;  Surgeon: Vickki Hearing, MD;  Location: [facility];  Service: Orthopedics;  Laterality: Right;  . KNEE ARTHROSCOPY WITH MEDIAL MENISECTOMY Left 03/20/2017   Procedure: KNEE ARTHROSCOPY WITH MEDIAL MENISECTOMY;  Surgeon: Vickki Hearing, MD;  Location: [facility];  Service: Orthopedics;  Laterality: Left;  . ORIF WRIST FRACTURE Left 12/19/2016   Procedure: OPEN REDUCTION INTERNAL FIXATION (ORIF) WRIST FRACTURE;  Surgeon: Betha Loa, MD;  Location: [facility];  Service: Orthopedics;  Laterality: Left;  accumed   . SHOULDER ARTHROCENTESIS Right   . VASECTOMY      Family History  Problem Relation Age of Onset  . Emphysema Maternal Grandfather   . Emphysema Maternal Grandmother   . Clotting disorder Maternal Grandmother     Allergies  Allergen Reactions  . Azithromycin Hives  . Clarithromycin   . Erythromycin Hives  . Keflex [Cephalexin] Nausea Only  . Metformin Nausea And Vomiting  . Symbicort [Budesonide-Formoterol Fumarate] Other (See Comments)    States that 3 doses were used and breathing became worse    Current Outpatient Medications on File Prior to Visit  Medication Sig Dispense Refill  . albuterol (PROVENTIL) (2.5 MG/3ML) 0.083% nebulizer solution USE 1 VIAL VIA NEBULIZER FOUR TIMES DAILY AS NEEDED FOR SHORTNESS OF BREATH 360 mL 0  . ANDROGEL PUMP 20.25 MG/ACT (1.62%) GEL Apply 2 Pump topically daily.     Marland Kitchen atorvastatin (LIPITOR) 20 MG  tablet Take 1 tablet (20 mg total) by mouth every evening. 90 tablet 3  . betamethasone dipropionate (DIPROLENE) 0.05 % cream Apply topically 2 (two) times daily as needed.     Marland Kitchen buPROPion (WELLBUTRIN SR) 150 MG 12 hr tablet TAKE ONE TABLET BY MOUTH TWICE A DAY 180 tablet 1  . celecoxib (CELEBREX) 200 MG capsule Take 200 mg by mouth 2 (two) times daily.     . ciclopirox (LOPROX) 0.77 % cream Apply topically 2 (two) times daily. 30 g 0  . CINNAMON PO Take 2,800 mg by mouth daily. EACH TABLET IS 1400 MG.  TAKES 2 CAPSULES DAILY    . citalopram (  CELEXA) 40 MG tablet TAKE ONE (1) TABLET BY MOUTH EVERY DAY 30 tablet 11  . diphenhydrAMINE (BENADRYL) 25 MG tablet Take 25 mg by mouth 2 (two) times daily.    . empagliflozin (JARDIANCE) 10 MG TABS tablet Take 10 mg by mouth every morning. 90 tablet 2  . fluticasone furoate-vilanterol (BREO ELLIPTA) 200-25 MCG/INH AEPB Inhale 1 puff into the lungs daily. 30 each 11  . glipiZIDE (GLUCOTROL) 5 MG tablet TAKE TWO TABLETS BY MOUTH TWICE DAILY 360 tablet 1  . ipratropium (ATROVENT) 0.02 % nebulizer solution USE 1 VIAL IN NEBULIZER FOUR TIMES DAILY AS NEEDED 300 mL 0  . Ipratropium-Albuterol (COMBIVENT) 20-100 MCG/ACT AERS respimat Inhale 1 puff into the lungs every 6 (six) hours as needed for wheezing. 1 Inhaler 2  . lansoprazole (PREVACID) 30 MG capsule TAKE ONE CAPSULE BY MOUTH TWICE A DAY BEFORE A MEAL 180 capsule 1  . metFORMIN (GLUCOPHAGE-XR) 500 MG 24 hr tablet Take 2 tablets (1,000 mg total) by mouth daily with breakfast.    . Multiple Vitamin (MULTIVITAMIN WITH MINERALS) TABS Take 1 tablet by mouth every morning. Men's once daily multivitamin packet (vitamin e, calcium, ginseng)    . naloxegol oxalate (MOVANTIK) 25 MG TABS tablet Take 1 tablet (25 mg total) by mouth daily. Take on an empty stomach at least 1 hour before or 2 hours after a meal. 30 tablet 0  . Saw Palmetto, Serenoa repens, (SAW PALMETTO PO) Take 1 capsule by mouth daily.    . tamsulosin  (FLOMAX) 0.4 MG CAPS capsule TAKE ONE (1) CAPSULE EACH DAY 90 capsule 1  . [START ON 04/12/2018] tapentadol (NUCYNTA) 50 MG 12 hr tablet Take 1 tablet (50 mg total) by mouth every 8 (eight) hours as needed. Do not take if you have epilepsy or a history of seizures. Swallow tablets whole. Do not chew, crush or dissolve. 60 tablet 0  . traZODone (DESYREL) 150 MG tablet Take 1 tablet (150 mg total) by mouth at bedtime. 90 tablet 1   No current facility-administered medications on file prior to visit.     BP 138/82   Pulse 77   Temp 98.2 F (36.8 C) (Oral)   Ht 5\' 10"  (1.778 m)   Wt 241 lb (109.3 kg)   SpO2 98%   BMI 34.58 kg/m    Objective:   Physical Exam  Constitutional: He is oriented to person, place, and time. He appears well-nourished.  Eyes: Pupils are equal, round, and reactive to light. EOM are normal.  Cardiovascular: Normal rate and regular rhythm.  Pulmonary/Chest: Effort normal and breath sounds normal.  Neurological: He is alert and oriented to person, place, and time. No cranial nerve deficit.  Skin: Skin is warm and dry.          Assessment & Plan:  Accidental Overdose:  Accidentally took approx 50 tablets of Nucynta. Evaluate and admitted to [facility]. Overall doing well, no constipation, lethargy. Neuro exam unremarkable. Repeat potassium and renal function.  Hospital notes and labs reviewed. Mayra Reel, NP-C

## 2018-03-27 NOTE — Patient Instructions (Addendum)
Stop by the lab prior to leaving today. I will notify you of your results once received.   Make sure to drink plenty of water.  It has been a pleasure seeing you today. Rebecka ApleyJoshua M Sahil Milner, RN, Adult-Geriatric Nurse Practitioner Student and Mayra ReelKate Clark, AGNP

## 2018-03-30 ENCOUNTER — Telehealth: Payer: Self-pay | Admitting: Pain Medicine

## 2018-03-30 ENCOUNTER — Other Ambulatory Visit: Payer: Self-pay | Admitting: Nurse Practitioner

## 2018-03-30 NOTE — Telephone Encounter (Signed)
Called Joseph [name] and informed him that CKing, Np would like for pt in come in an discuss incident with Dr Laban Emperor before next appt

## 2018-03-30 NOTE — Telephone Encounter (Signed)
Talked with CKing,NP and she would like for patient to come in for an appt. With Dr Laban Emperor and to put a hold on prescrip for Nucynta due 04/12/18. [facility] call at approx. 1520 to suspend med.

## 2018-03-30 NOTE — Telephone Encounter (Signed)
Had overdose on meds last week and needs to speak with nurse

## 2018-03-30 NOTE — Telephone Encounter (Signed)
Patient was transferred to me via phone. Talked with patient and he states that he has been recently in Hospital at [facility]. for drug overdose.  States that he accidentally took 50 pills last Tuesday 04/23/18. Patient told me that he usually has two medication bottles, 1 for Daily medications and 1 Nucynta. He was driving to a job and reached over and grabbed his Nucynta bottle . He stated that he immediately knew that he took the wrong medications and drove hisself to [facility]. When he arrived he was alert and then later placed on a Narcan drip after they realized what he had taken. I asked the patient how he knew he took 50 pills and he stated that they counted the remaining pills and based it on the day he had medication filled. He was observed overnight and was discharged on Wednesday. He stated that all information was in the chart and that they documented it as ACCIDENTAL. Marland Kitchen Pt states he was not trying to harm or hurt hisself. Patient next appointment is in June, but next med refill is 04/12/18  States he is now hurting. Told patient that I would talk to CKing,NP and inform her of hospitalization but will probably not refill until May. Instructed pt on importance of medication safety and his daily medications should be in something different than a pill bottle.

## 2018-04-06 ENCOUNTER — Ambulatory Visit: Payer: Medicare Other | Attending: Pain Medicine | Admitting: Pain Medicine

## 2018-04-06 ENCOUNTER — Encounter: Payer: Self-pay | Admitting: Pain Medicine

## 2018-04-06 VITALS — BP 126/90 | HR 81 | Temp 97.8°F | Resp 16 | Ht 70.0 in | Wt 241.0 lb

## 2018-04-06 DIAGNOSIS — M25532 Pain in left wrist: Secondary | ICD-10-CM | POA: Insufficient documentation

## 2018-04-06 DIAGNOSIS — E785 Hyperlipidemia, unspecified: Secondary | ICD-10-CM | POA: Insufficient documentation

## 2018-04-06 DIAGNOSIS — G8929 Other chronic pain: Secondary | ICD-10-CM

## 2018-04-06 DIAGNOSIS — Z79899 Other long term (current) drug therapy: Secondary | ICD-10-CM | POA: Insufficient documentation

## 2018-04-06 DIAGNOSIS — Z6834 Body mass index (BMI) 34.0-34.9, adult: Secondary | ICD-10-CM | POA: Diagnosis not present

## 2018-04-06 DIAGNOSIS — M25561 Pain in right knee: Secondary | ICD-10-CM | POA: Diagnosis not present

## 2018-04-06 DIAGNOSIS — Z881 Allergy status to other antibiotic agents status: Secondary | ICD-10-CM | POA: Insufficient documentation

## 2018-04-06 DIAGNOSIS — J449 Chronic obstructive pulmonary disease, unspecified: Secondary | ICD-10-CM | POA: Insufficient documentation

## 2018-04-06 DIAGNOSIS — K219 Gastro-esophageal reflux disease without esophagitis: Secondary | ICD-10-CM | POA: Diagnosis not present

## 2018-04-06 DIAGNOSIS — E119 Type 2 diabetes mellitus without complications: Secondary | ICD-10-CM | POA: Diagnosis not present

## 2018-04-06 DIAGNOSIS — G4733 Obstructive sleep apnea (adult) (pediatric): Secondary | ICD-10-CM | POA: Diagnosis not present

## 2018-04-06 DIAGNOSIS — M25562 Pain in left knee: Secondary | ICD-10-CM

## 2018-04-06 DIAGNOSIS — X58XXXD Exposure to other specified factors, subsequent encounter: Secondary | ICD-10-CM | POA: Insufficient documentation

## 2018-04-06 DIAGNOSIS — T40601D Poisoning by unspecified narcotics, accidental (unintentional), subsequent encounter: Secondary | ICD-10-CM | POA: Insufficient documentation

## 2018-04-06 DIAGNOSIS — M1712 Unilateral primary osteoarthritis, left knee: Secondary | ICD-10-CM

## 2018-04-06 DIAGNOSIS — M48061 Spinal stenosis, lumbar region without neurogenic claudication: Secondary | ICD-10-CM | POA: Diagnosis not present

## 2018-04-06 DIAGNOSIS — F329 Major depressive disorder, single episode, unspecified: Secondary | ICD-10-CM | POA: Diagnosis not present

## 2018-04-06 DIAGNOSIS — G894 Chronic pain syndrome: Secondary | ICD-10-CM | POA: Insufficient documentation

## 2018-04-06 DIAGNOSIS — Z7984 Long term (current) use of oral hypoglycemic drugs: Secondary | ICD-10-CM | POA: Diagnosis not present

## 2018-04-06 DIAGNOSIS — M170 Bilateral primary osteoarthritis of knee: Secondary | ICD-10-CM | POA: Insufficient documentation

## 2018-04-06 DIAGNOSIS — F419 Anxiety disorder, unspecified: Secondary | ICD-10-CM | POA: Diagnosis not present

## 2018-04-06 DIAGNOSIS — F17210 Nicotine dependence, cigarettes, uncomplicated: Secondary | ICD-10-CM | POA: Diagnosis not present

## 2018-04-06 NOTE — Patient Instructions (Signed)
____________________________________________________________________________________________  Preparing for Procedure with Sedation  Instructions: . Oral Intake: Do not eat or drink anything for at least 8 hours prior to your procedure. . Transportation: Public transportation is not allowed. Bring an adult driver. The driver must be physically present in our waiting room before any procedure can be started. . Physical Assistance: Bring an adult physically capable of assisting you, in the event you need help. This adult should keep you company at home for at least 6 hours after the procedure. . Blood Pressure Medicine: Take your blood pressure medicine with a sip of water the morning of the procedure. . Blood thinners:  . Diabetics on insulin: Notify the staff so that you can be scheduled 1st case in the morning. If your diabetes requires high dose insulin, take only  of your normal insulin dose the morning of the procedure and notify the staff that you have done so. . Preventing infections: Shower with an antibacterial soap the morning of your procedure. . Build-up your immune system: Take 1000 mg of Vitamin C with every meal (3 times a day) the day prior to your procedure. . Antibiotics: Inform the staff if you have a condition or reason that requires you to take antibiotics before dental procedures. . Pregnancy: If you are pregnant, call and cancel the procedure. . Sickness: If you have a cold, fever, or any active infections, call and cancel the procedure. . Arrival: You must be in the facility at least 30 minutes prior to your scheduled procedure. . Children: Do not bring children with you. . Dress appropriately: Bring dark clothing that you would not mind if they get stained. . Valuables: Do not bring any jewelry or valuables.  Procedure appointments are reserved for interventional treatments only. . No Prescription Refills. . No medication changes will be discussed during procedure  appointments. . No disability issues will be discussed.  Remember:  Regular Business hours are:  Monday to Thursday 8:00 AM to 4:00 PM  Provider's Schedule: Tsuyako Jolley, MD:  Procedure days: Tuesday and Thursday 7:30 AM to 4:00 PM  Bilal Lateef, MD:  Procedure days: Monday and Wednesday 7:30 AM to 4:00 PM ____________________________________________________________________________________________    

## 2018-04-06 NOTE — Progress Notes (Signed)
Patient's Name: Joseph [name]  MRN: [id number]  Referring Provider: Pleas Koch, NP  DOB: 1971-06-02  PCP: Pleas Koch, NP  DOS: 04/06/2018  Note by: Gaspar Cola, MD  Service setting: Ambulatory outpatient  Specialty: Interventional Pain Management  Location: [facility] (AMB) Pain Management Facility    Patient type: Established   Primary Reason(s) for Visit: Evaluation of chronic illnesses with exacerbation, or progression (Level of risk: moderate) CC: Knee Pain (left) and Hip Pain (right )  HPI  Joseph [name] is a 47 y.o. year old, male patient, who comes today for a follow-up evaluation. He has Mediastinal abnormality; Cigarette smoker; Chronic pain syndrome; Diabetes ([place]); Intrinsic asthma; Medial meniscus, posterior horn derangement; S/P knee surgery; Acute meniscal tear of left knee; Testosterone deficiency; OSA (obstructive sleep apnea); Morbid obesity ([place]); Anxiety and depression; Bucket handle tear of meniscus of right knee; Skin lesions; GERD (gastroesophageal reflux disease); Osteoarthritis of the knee (Left); Post-void dribbling; COPD without exacerbation ([place]); Hyperlipidemia; Chronic constipation; Disorder of skeletal system; Chronic knee pain (Primary Area of Pain) (Bilateral) (L>R); Chronic wrist pain (Secondary Area of Pain) (Left); Insomnia; Male hypogonadism; Pharmacologic therapy; Problems influencing health status; Long term prescription opiate use; Opiate use; Tinea corporis; Tinea versicolor; Arthropathy of left knee; Opioid-induced constipation (OIC); Opiate overdose ([place]); Acute encephalopathy; and Encephalopathy acute on their problem list. Joseph [name] was last seen on 03/30/2018. His primarily concern today is the Knee Pain (left) and Hip Pain (right )  Pain Assessment: Location: Left(left) Knee(hip) Radiating: na Onset: More than a month ago Duration: Chronic pain Quality: Discomfort, Sharp, Stabbing(hurts more at the end of the day.  sometimes while laying in bed  the pain just hits him. ) Severity: 1 /10 (subjective, self-reported pain score)  Note: Reported level is compatible with observation.                         When using our objective Pain Scale, levels between 6 and 10/10 are said to belong in an emergency room, as it progressively worsens from a 6/10, described as severely limiting, requiring emergency care not usually available at an outpatient pain management facility. At a 6/10 level, communication becomes difficult and requires great effort. Assistance to reach the emergency department may be required. Facial flushing and profuse sweating along with potentially dangerous increases in heart rate and blood pressure will be evident. Effect on ADL: affecting gait and stability is affected when stepping down.   Timing: Constant Modifying factors: medications, rest BP: 126/90  HR: 81   The patient comes in to the clinic today after having had a scare with his medicines.  Apparently he had an accidental overdose were he grabbed the wrong bottle and took more pills than he was supposed to.  As soon as he realized it, he drove himself to any pain hospital and checked himself into the emergency room where he was kept at the ICU with a Narcan drip.  He has now developed a healthy respect for the medicines and understands that we need to proceed with the genicular nerve radiofrequency in order to lower the pain so that he can come off of the medicines.  He still has another prescription that should last him until 05/12/2018.  He understands that he did break our medication rules by driving while taking the pain medication.  He realizes this and has apologized for it.  Further details on both, my assessment(s), as well as the proposed treatment plan,  please see below.  Laboratory Chemistry  Inflammation Markers (CRP: Acute Phase) (ESR: Chronic Phase) Lab Results  Component Value Date   CRP 0.7 11/05/2017   ESRSEDRATE 11 11/05/2017   LATICACIDVEN 2.1  01/24/2013                         Renal Function Markers Lab Results  Component Value Date   BUN 7 03/27/2018   CREATININE 0.88 03/27/2018   GFRAA >60 03/25/2018   GFRNONAA >60 03/25/2018                              Hepatic Function Markers Lab Results  Component Value Date   AST 11 (L) 03/25/2018   ALT 13 (L) 03/25/2018   ALBUMIN 3.6 03/25/2018   ALKPHOS 67 03/25/2018   LIPASE 38 01/24/2013                        Electrolytes Lab Results  Component Value Date   NA 140 03/27/2018   K 4.0 03/27/2018   CL 102 03/27/2018   CALCIUM 9.1 03/27/2018   MG 2.1 11/05/2017                        Neuropathy Markers Lab Results  Component Value Date   VITAMINB12 341 11/05/2017   HGBA1C 6.7 (H) 03/24/2018   HIV Non Reactive 03/24/2018                        Bone Pathology Markers Lab Results  Component Value Date   25OHVITD1 42 11/05/2017   25OHVITD2 <1.0 11/05/2017   25OHVITD3 42 11/05/2017   TESTOSTERONE 208.98 (L) 04/17/2017                         Coagulation Parameters Lab Results  Component Value Date   PLT 173 03/25/2018   DDIMER 0.46 03/07/2012                        Cardiovascular Markers Lab Results  Component Value Date   TROPONINI <0.30 12/09/2013   HGB 13.8 03/25/2018   HCT 42.3 03/25/2018                         Note: Lab results reviewed.  Recent Diagnostic Imaging Review  Lumbosacral Imaging: Lumbar MR wo contrast:  Results for orders placed during the hospital encounter of 09/03/16  MR Lumbar Spine Wo Contrast   Narrative CLINICAL DATA:  47 y/o M; lower back pain that extends throughout the left leg with numbness and weakness for the past 3 months.  EXAM: MRI LUMBAR SPINE WITHOUT CONTRAST  TECHNIQUE: Multiplanar, multisequence MR imaging of the lumbar spine was performed. No intravenous contrast was administered.  COMPARISON:  08/20/2016 lumbar radiographs. 01/24/2013 CT abdomen and pelvis.  FINDINGS: Segmentation:   Standard.  Alignment:  Physiologic.  Vertebrae: No fracture, evidence of discitis, or bone lesion. Mild degenerative edema within the L1 and L2 endplates. 13 mm L2 vertebral body T1 and T2 hyperintense focus is likely a hemangioma  Conus medullaris: Extends to the L1-2 level and appears normal.  Paraspinal and other soft tissues: Negative.  Disc levels:  There is disc desiccation and disc space narrowing at T11-12 and L3-5.  L1-2: No significant disc  displacement, foraminal narrowing, or canal stenosis.  L2-3: Small disc bulge eccentric to the left. No significant canal stenosis or foraminal narrowing.  L3-4: Small disc bulge and mild facet hypertrophy. No significant canal stenosis or foraminal narrowing.  L4-5: Moderate disc bulge eccentric to the left foraminal and far lateral zone and moderate facet/ligamentum flavum hypertrophy. Mild right and moderate to severe left foraminal narrowing. Mild canal stenosis. Effacement of the lateral recesses bilaterally.  L5-S1: Small disc bulge and bilateral facet and ligamentum flavum hypertrophy. Mild bilateral foraminal narrowing. No significant canal stenosis.  IMPRESSION: Lumbar spondylosis greatest at the L4-5 level with there is multifactorial and mild right and moderate to severe left foraminal narrowing and mild canal stenosis. Minimal degenerative changes at other levels.   Electronically Signed   By: Kristine Garbe M.D.   On: 09/03/2016 21:14    Lumbar DG (Complete) 4+V:  Results for orders placed during the hospital encounter of 08/20/16  DG Lumbar Spine Complete   Narrative CLINICAL DATA:  Low back and left leg pain for 3-4 months.  EXAM: LUMBAR SPINE - COMPLETE 4+ VIEW  COMPARISON:  04/03/2015  FINDINGS: Normal alignment of the lumbar vertebral bodies. Disc spaces and vertebral bodies are maintained. Small marginal osteophytes are again noted. Mild to moderate lower lumbar facet disease  without definite pars defects. Stable compression deformities in the lower thoracic spine. The visualized bony pelvis is intact and the SI joints appear normal.  IMPRESSION: Normal alignment and no acute bony findings.  Mild degenerative changes.  Stable compression deformities in the lower thoracic spine.   Electronically Signed   By: Marijo Sanes M.D.   On: 08/20/2016 17:27    Knee Imaging: Knee-L MR w contrast:  Results for orders placed during the hospital encounter of 02/25/17  MR Knee Left  Wo Contrast   Narrative CLINICAL DATA:  Knee pain and swelling. Recent fall at home. Prior meniscal surgery in 2015.  EXAM: MRI OF THE LEFT KNEE WITHOUT CONTRAST  TECHNIQUE: Multiplanar, multisequence MR imaging of the knee was performed. No intravenous contrast was administered.  COMPARISON:  None.  FINDINGS: MENISCI  Medial meniscus: Grade 3 horizontal signal extends to the inferior surface in the posterior horn and to the free edge in the midbody.  Lateral meniscus:  Unremarkable  LIGAMENTS  Cruciates: Accentuated signal and mild expansion of the ACL. Considerable mid PCL abnormal signal compatible with tear or high-grade sprain.  Collaterals:  Unremarkable  CARTILAGE  Patellofemoral: Focal chondral defect along the posterior patellar ridge with underlying subcortical marrow edema, images 7-8 series 3. Mild degenerative chondral thinning in the femoral trochlear groove.  Medial: Moderate degenerative chondral thinning. Focal chondral defect centrally along the medial femoral condyle on image 8/6.  Lateral:  Unremarkable  Joint: Small to moderate knee effusion. Mildly thickened medial plica. Low signal medially in Hoffa' s fat pad likely represents a small amount of fibrosis.  Popliteal Fossa:  Very small Baker's cyst.  Extensor Mechanism:  Prepatellar subcutaneous edema.  Bones: No significant extra-articular osseous  abnormalities identified.  Other: No supplemental non-categorized findings.  IMPRESSION: 1. Grade 3 signal in the posterior horn and midbody medial meniscus, suspicious for tear although occasionally postoperative signal can appear in a similar fashion. 2. Highly indistinct midportion of the PCL suspicious for partial tearing or severe degeneration. The ACL is more moderately degenerated. 3. Focal chondral defects along the posterior patellar ridge and centrally along the medial femoral condyles. Moderate chondral thinning in the medial compartment with mild  chondral thinning in the patellofemoral joint. 4. Small to moderate knee effusion with mildly thickened medial plica. 5. Indistinct low signal medially in Hoffa's fat pad likely represents a small amount of fibrosis. 6. Very small Baker's cyst. 7. Prepatellar subcutaneous edema.   Electronically Signed   By: Van Clines M.D.   On: 02/25/2017 16:12    Knee-R MR wo contrast:  Results for orders placed during the hospital encounter of 11/13/15  MR Knee Right Wo Contrast   Narrative CLINICAL DATA:  Right knee pain.  Heard a pop 3 weeks ago.  EXAM: MRI OF THE RIGHT KNEE WITHOUT CONTRAST  TECHNIQUE: Multiplanar, multisequence MR imaging of the knee was performed. No intravenous contrast was administered.  COMPARISON:  None.  FINDINGS: MENISCI  Medial meniscus: Bucket-handle tear of the medial meniscus flipped towards the intercondylar notch.  Lateral meniscus:  Intact.  LIGAMENTS  Cruciates:  Intact ACL and PCL.  Collaterals: Medial collateral ligament is intact. Lateral collateral ligament complex is intact.  CARTILAGE  Patellofemoral: Chondromalacia with partial thickness cartilage loss of the medial patellar facet.  Medial: Partial thickness cartilage loss the medial femoral condyle and medial tibial plateau.  Lateral:  No focal chondral defect.  Joint: Moderate joint effusion. No plical  thickening. Minimal edema in Hoffa's fat.  Popliteal Fossa:  No Baker cyst.  Intact popliteus tendon.  Extensor Mechanism:  Intact.  Bones: No focal marrow signal abnormality. No acute fracture or dislocation.  IMPRESSION: 1. Bucket-handle tear of the medial meniscus flipped towards the intercondylar notch. 2. 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 3. Partial thickness cartilage loss the medial femoral condyle and medial tibial plateau. 4. Moderate joint effusion.   Electronically Signed   By: Kathreen Devoid   On: 11/14/2015 08:26    Knee-R DG 4 views:  Results for orders placed during the hospital encounter of 10/14/15  DG Knee Complete 4 Views Right   Narrative CLINICAL DATA:  Right knee injury, pain/swelling  EXAM: RIGHT KNEE - COMPLETE 4+ VIEW  COMPARISON:  None.  FINDINGS: No fracture or dislocation is seen.  The joint spaces are preserved.  Moderate suprapatellar knee joint effusion.  IMPRESSION: No fracture or dislocation is seen.  Moderate suprapatellar knee joint effusion.   Electronically Signed   By: Julian Hy M.D.   On: 10/14/2015 11:48    Knee-L DG 4 views:  Results for orders placed during the hospital encounter of 08/18/13  DG Knee Complete 4 Views Left   Narrative CLINICAL DATA:  Knee gave out, fell, pain and bruising.  EXAM: LEFT KNEE - COMPLETE 4+ VIEW  COMPARISON:  None.  FINDINGS: No effusion. Small traction spur from the patella at the insertion of the quadriceps tendon. Negative for fracture, dislocation, or other acute bone abnormality. Normal mineralization and alignment.  IMPRESSION: Negative.   Electronically Signed   By: Arne Cleveland M.D.   On: 08/18/2013 17:32    Complexity Note: Imaging results reviewed. Results shared with Mr. Mcclaine, using Layman's terms.                         Meds   Current Outpatient Medications:  .  albuterol (PROVENTIL) (2.5  MG/3ML) 0.083% nebulizer solution, USE 1 VIAL VIA NEBULIZER FOUR TIMES DAILY AS NEEDED FOR SHORTNESS OF BREATH, Disp: 360 mL, Rfl: 0 .  ANDROGEL PUMP 20.25 MG/ACT (1.62%) GEL, Apply 2 Pump topically daily. , Disp: , Rfl:  .  atorvastatin (  LIPITOR) 20 MG tablet, Take 1 tablet (20 mg total) by mouth every evening., Disp: 90 tablet, Rfl: 3 .  betamethasone dipropionate (DIPROLENE) 0.05 % cream, Apply topically 2 (two) times daily as needed. , Disp: , Rfl:  .  buPROPion (WELLBUTRIN SR) 150 MG 12 hr tablet, TAKE ONE TABLET BY MOUTH TWICE A DAY, Disp: 180 tablet, Rfl: 1 .  celecoxib (CELEBREX) 200 MG capsule, Take 200 mg by mouth 2 (two) times daily. , Disp: , Rfl:  .  ciclopirox (LOPROX) 0.77 % cream, Apply topically 2 (two) times daily., Disp: 30 g, Rfl: 0 .  CINNAMON PO, Take 2,800 mg by mouth daily. EACH TABLET IS 1400 MG.  TAKES 2 CAPSULES DAILY, Disp: , Rfl:  .  citalopram (CELEXA) 40 MG tablet, TAKE ONE (1) TABLET BY MOUTH EVERY DAY, Disp: 30 tablet, Rfl: 11 .  diphenhydrAMINE (BENADRYL) 25 MG tablet, Take 25 mg by mouth 2 (two) times daily., Disp: , Rfl:  .  empagliflozin (JARDIANCE) 10 MG TABS tablet, Take 10 mg by mouth every morning., Disp: 90 tablet, Rfl: 2 .  fluticasone furoate-vilanterol (BREO ELLIPTA) 200-25 MCG/INH AEPB, Inhale 1 puff into the lungs daily., Disp: 30 each, Rfl: 11 .  glipiZIDE (GLUCOTROL) 5 MG tablet, TAKE TWO TABLETS BY MOUTH TWICE DAILY, Disp: 360 tablet, Rfl: 1 .  ipratropium (ATROVENT) 0.02 % nebulizer solution, USE 1 VIAL IN NEBULIZER FOUR TIMES DAILY AS NEEDED, Disp: 300 mL, Rfl: 0 .  Ipratropium-Albuterol (COMBIVENT) 20-100 MCG/ACT AERS respimat, Inhale 1 puff into the lungs every 6 (six) hours as needed for wheezing., Disp: 1 Inhaler, Rfl: 2 .  lansoprazole (PREVACID) 30 MG capsule, TAKE ONE CAPSULE BY MOUTH TWICE A DAY BEFORE A MEAL, Disp: 180 capsule, Rfl: 1 .  metFORMIN (GLUCOPHAGE-XR) 500 MG 24 hr tablet, Take 2 tablets (1,000 mg total) by mouth daily with  breakfast., Disp: , Rfl:  .  Multiple Vitamin (MULTIVITAMIN WITH MINERALS) TABS, Take 1 tablet by mouth every morning. Men's once daily multivitamin packet (vitamin e, calcium, ginseng), Disp: , Rfl:  .  naloxegol oxalate (MOVANTIK) 25 MG TABS tablet, Take 1 tablet (25 mg total) by mouth daily. Take on an empty stomach at least 1 hour before or 2 hours after a meal., Disp: 30 tablet, Rfl: 0 .  Saw Palmetto, Serenoa repens, (SAW PALMETTO PO), Take 1 capsule by mouth daily., Disp: , Rfl:  .  tamsulosin (FLOMAX) 0.4 MG CAPS capsule, TAKE ONE (1) CAPSULE EACH DAY, Disp: 90 capsule, Rfl: 1 .  [START ON 04/12/2018] tapentadol (NUCYNTA) 50 MG 12 hr tablet, Take 1 tablet (50 mg total) by mouth every 8 (eight) hours as needed. Do not take if you have epilepsy or a history of seizures. Swallow tablets whole. Do not chew, crush or dissolve., Disp: 60 tablet, Rfl: 0 .  traZODone (DESYREL) 150 MG tablet, Take 1 tablet (150 mg total) by mouth at bedtime., Disp: 90 tablet, Rfl: 1  ROS  Constitutional: Denies any fever or chills Gastrointestinal: No reported hemesis, hematochezia, vomiting, or acute GI distress Musculoskeletal: Denies any acute onset joint swelling, redness, loss of ROM, or weakness Neurological: No reported episodes of acute onset apraxia, aphasia, dysarthria, agnosia, amnesia, paralysis, loss of coordination, or loss of consciousness  Allergies  Mr. Maciver is allergic to azithromycin; clarithromycin; erythromycin; keflex [cephalexin]; metformin; and symbicort [budesonide-formoterol fumarate].  [place]  Drug: Mr. Kowal  reports that he does not use drugs. Alcohol:  reports that he does not drink alcohol. Tobacco:  reports that  he has been smoking cigarettes.  He started smoking about 24 years ago. He has a 30.00 pack-year smoking history. He has quit using smokeless tobacco. Medical:  has a past medical history of Anxiety, Arthritis, Asthma, Chronic back pain, Chronic pain of left knee, COPD  (chronic obstructive pulmonary disease) ([place]), Diabetes mellitus, GERD (gastroesophageal reflux disease), INGUINAL PAIN, LEFT (10/03/2009), Shortness of breath, and Sleep apnea. Surgical: Mr. Barretto  has a past surgical history that includes Carpal tunnel release (Left); Shoulder Arthrocentesis (Right); Vasectomy; Hernia repair (Bilateral); Knee arthroscopy with medial menisectomy (Left, 04/22/2014); Knee arthroscopy with medial menisectomy (Right, 12/06/2015); Cataract extraction w/PHACO (Right, 03/11/2016); ORIF wrist fracture (Left, 12/19/2016); and Knee arthroscopy with medial menisectomy (Left, 03/20/2017). Family: family history includes Clotting disorder in his maternal grandmother; Emphysema in his maternal grandfather and maternal grandmother.  Constitutional Exam  General appearance: Well nourished, well developed, and well hydrated. In no apparent acute distress Vitals:   04/06/18 0908  BP: 126/90  Pulse: 81  Resp: 16  Temp: 97.8 F (36.6 C)  TempSrc: Oral  SpO2: 100%  Weight: 241 lb (109.3 kg)  Height: '5\' 10"'  (1.778 m)   BMI Assessment: Estimated body mass index is 34.58 kg/m as calculated from the following:   Height as of this encounter: '5\' 10"'  (1.778 m).   Weight as of this encounter: 241 lb (109.3 kg).  BMI interpretation table: BMI level Category Range association with higher incidence of chronic pain  <18 kg/m2 Underweight   18.5-24.9 kg/m2 Ideal body weight   25-29.9 kg/m2 Overweight Increased incidence by 20%  30-34.9 kg/m2 Obese (Class I) Increased incidence by 68%  35-39.9 kg/m2 Severe obesity (Class II) Increased incidence by 136%  >40 kg/m2 Extreme obesity (Class III) Increased incidence by 254%   Patient's current BMI Ideal Body weight  Body mass index is 34.58 kg/m. Ideal body weight: 73 kg (160 lb 15 oz) Adjusted ideal body weight: 87.5 kg (192 lb 15.4 oz)   BMI Readings from Last 4 Encounters:  04/06/18 34.58 kg/m  03/27/18 34.58 kg/m  03/25/18 35.94  kg/m  03/18/18 33.58 kg/m   Wt Readings from Last 4 Encounters:  04/06/18 241 lb (109.3 kg)  03/27/18 241 lb (109.3 kg)  03/25/18 243 lb 6.2 oz (110.4 kg)  03/18/18 234 lb (106.1 kg)  Psych/Mental status: Alert, oriented x 3 (person, place, & time)       Eyes: PERLA Respiratory: No evidence of acute respiratory distress  Cervical Spine Area Exam  Skin & Axial Inspection: No masses, redness, edema, swelling, or associated skin lesions Alignment: Symmetrical Functional ROM: Unrestricted ROM      Stability: No instability detected Muscle Tone/Strength: Functionally intact. No obvious neuro-muscular anomalies detected. Sensory (Neurological): Unimpaired Palpation: No palpable anomalies              Upper Extremity (UE) Exam    Side: Right upper extremity  Side: Left upper extremity  Skin & Extremity Inspection: Skin color, temperature, and hair growth are WNL. No peripheral edema or cyanosis. No masses, redness, swelling, asymmetry, or associated skin lesions. No contractures.  Skin & Extremity Inspection: Skin color, temperature, and hair growth are WNL. No peripheral edema or cyanosis. No masses, redness, swelling, asymmetry, or associated skin lesions. No contractures.  Functional ROM: Unrestricted ROM          Functional ROM: Unrestricted ROM          Muscle Tone/Strength: Functionally intact. No obvious neuro-muscular anomalies detected.  Muscle Tone/Strength: Functionally intact. No obvious  neuro-muscular anomalies detected.  Sensory (Neurological): Unimpaired          Sensory (Neurological): Unimpaired          Palpation: No palpable anomalies              Palpation: No palpable anomalies              Specialized Test(s): Deferred         Specialized Test(s): Deferred          Thoracic Spine Area Exam  Skin & Axial Inspection: No masses, redness, or swelling Alignment: Symmetrical Functional ROM: Unrestricted ROM Stability: No instability detected Muscle Tone/Strength:  Functionally intact. No obvious neuro-muscular anomalies detected. Sensory (Neurological): Unimpaired Muscle strength & Tone: No palpable anomalies  Lumbar Spine Area Exam  Skin & Axial Inspection: No masses, redness, or swelling Alignment: Symmetrical Functional ROM: Unrestricted ROM       Stability: No instability detected Muscle Tone/Strength: Functionally intact. No obvious neuro-muscular anomalies detected. Sensory (Neurological): Unimpaired Palpation: No palpable anomalies       Provocative Tests: Lumbar Hyperextension and rotation test: evaluation deferred today       Lumbar Lateral bending test: evaluation deferred today       Patrick's Maneuver: evaluation deferred today                    Gait & Posture Assessment  Ambulation: Unassisted Gait: Antalgic Posture: WNL   Lower Extremity Exam    Side: Right lower extremity  Side: Left lower extremity  Stability: No instability observed          Stability: No instability observed          Skin & Extremity Inspection: Skin color, temperature, and hair growth are WNL. No peripheral edema or cyanosis. No masses, redness, swelling, asymmetry, or associated skin lesions. No contractures.  Skin & Extremity Inspection: Skin color, temperature, and hair growth are WNL. No peripheral edema or cyanosis. No masses, redness, swelling, asymmetry, or associated skin lesions. No contractures.  Functional ROM: Unrestricted ROM                  Functional ROM: Decreased ROM                  Muscle Tone/Strength: Functionally intact. No obvious neuro-muscular anomalies detected.  Muscle Tone/Strength: TEFL teacher (Neurological): Unimpaired  Sensory (Neurological): Arthropathic arthralgia  Palpation: No palpable anomalies  Palpation: No palpable anomalies   Assessment  Primary Diagnosis & Pertinent Problem List: The primary encounter diagnosis was Chronic knee pain (Primary Area of Pain) (Bilateral) (L>R). Diagnoses of Osteoarthritis of the  knee (Left), Arthropathy of left knee, Chronic wrist pain (Secondary Area of Pain) (Left), Chronic pain syndrome, and Opiate overdose, accidental or unintentional, subsequent encounter were also pertinent to this visit.  Status Diagnosis  Persistent Persistent Persistent 1. Chronic knee pain (Primary Area of Pain) (Bilateral) (L>R)   2. Osteoarthritis of the knee (Left)   3. Arthropathy of left knee   4. Chronic wrist pain (Secondary Area of Pain) (Left)   5. Chronic pain syndrome   6. Opiate overdose, accidental or unintentional, subsequent encounter     Problems updated and reviewed during this visit: No problems updated. Plan of Care  Pharmacotherapy (Medications Ordered): No orders of the defined types were placed in this encounter.  Medications administered today: Arn Medal "Richard" had no medications administered during this visit.   Procedure Orders     Radiofrequency,Genicular Lab Orders  No laboratory test(s) ordered today   Imaging Orders  No imaging studies ordered today   Referral Orders  No referral(s) requested today    Interventional management options: Planned, scheduled, and/or pending:   Therapeutic left genicular nerve RFA #1 under fluoroscopic guidance and IV sedation   Considering:   Diagnostic Left intra-articular knee injection  Diagnostic Left genicular nerve block #3  Possible Left genicular RFA  Diagnostic Left Hyalgan series    Palliative PRN treatment(s):   None at this time   Provider-requested follow-up: Return for RFA (fluoro + sedation): (L) Genicular Nerve RFA.  Future Appointments  Date Time Provider [place]  04/21/2018  2:15 PM Milinda Pointer, MD [facility] None  05/07/2018 11:30 AM Vevelyn Francois, NP [facility] None  05/20/2018 10:30 AM Eustace Pen, LPN [facility]  6/64/6605  9:00 AM Pleas Koch, NP [facility]   Primary Care Physician: Pleas Koch, NP Location: [facility] Note by: Gaspar Cola, MD Date: 04/06/2018; Time: 10:06 AM

## 2018-04-21 ENCOUNTER — Encounter

## 2018-04-21 ENCOUNTER — Ambulatory Visit
Admission: RE | Admit: 2018-04-21 | Discharge: 2018-04-21 | Disposition: A | Discharge status: Home / Self Care | Payer: Medicare Other | Source: Ambulatory Visit | Attending: Pain Medicine | Admitting: Pain Medicine

## 2018-04-21 ENCOUNTER — Other Ambulatory Visit: Payer: Self-pay

## 2018-04-21 ENCOUNTER — Encounter: Payer: Self-pay | Admitting: Pain Medicine

## 2018-04-21 ENCOUNTER — Ambulatory Visit (HOSPITAL_BASED_OUTPATIENT_CLINIC_OR_DEPARTMENT_OTHER): Payer: Medicare Other | Admitting: Pain Medicine

## 2018-04-21 VITALS — BP 102/90 | HR 95 | Temp 98.6°F | Resp 20 | Ht 70.0 in | Wt 241.0 lb

## 2018-04-21 DIAGNOSIS — M1712 Unilateral primary osteoarthritis, left knee: Secondary | ICD-10-CM | POA: Diagnosis not present

## 2018-04-21 DIAGNOSIS — G8929 Other chronic pain: Secondary | ICD-10-CM | POA: Insufficient documentation

## 2018-04-21 DIAGNOSIS — M25562 Pain in left knee: Secondary | ICD-10-CM

## 2018-04-21 DIAGNOSIS — M25561 Pain in right knee: Secondary | ICD-10-CM | POA: Diagnosis not present

## 2018-04-21 DIAGNOSIS — X58XXXS Exposure to other specified factors, sequela: Secondary | ICD-10-CM | POA: Diagnosis not present

## 2018-04-21 DIAGNOSIS — G8918 Other acute postprocedural pain: Secondary | ICD-10-CM

## 2018-04-21 DIAGNOSIS — S83207S Unspecified tear of unspecified meniscus, current injury, left knee, sequela: Secondary | ICD-10-CM | POA: Insufficient documentation

## 2018-04-21 DIAGNOSIS — M25462 Effusion, left knee: Secondary | ICD-10-CM | POA: Insufficient documentation

## 2018-04-21 MED ORDER — LIDOCAINE HCL 2 % IJ SOLN
20.0000 mL | Freq: Once | INTRAMUSCULAR | Status: AC
  Administered 2018-04-21: 400 mg
  Filled 2018-04-21: qty 40

## 2018-04-21 MED ORDER — METHYLPREDNISOLONE ACETATE 80 MG/ML IJ SUSP
80.0000 mg | Freq: Once | INTRAMUSCULAR | Status: AC
  Administered 2018-04-21: 80 mg
  Filled 2018-04-21: qty 1

## 2018-04-21 MED ORDER — METHYLPREDNISOLONE ACETATE 80 MG/ML IJ SUSP
80.0000 mg | Freq: Once | INTRAMUSCULAR | Status: AC
  Administered 2018-04-21: 80 mg

## 2018-04-21 MED ORDER — ROPIVACAINE HCL 2 MG/ML IJ SOLN
9.0000 mL | Freq: Once | INTRAMUSCULAR | Status: AC
  Administered 2018-04-21: 10 mL
  Filled 2018-04-21: qty 10

## 2018-04-21 MED ORDER — LIDOCAINE HCL 2 % IJ SOLN: 20.0000 mL | Freq: Once | INTRAMUSCULAR | Status: DC

## 2018-04-21 MED ORDER — OXYCODONE-ACETAMINOPHEN 5-325 MG PO TABS
1.0000 | ORAL_TABLET | Freq: Four times a day (QID) | ORAL | 0 refills | Status: AC | PRN
Start: 2018-04-21 — End: 2018-04-28

## 2018-04-21 MED ORDER — METHYLPREDNISOLONE ACETATE 80 MG/ML IJ SUSP
INTRAMUSCULAR | Status: AC
  Filled 2018-04-21: qty 1

## 2018-04-21 MED ORDER — FENTANYL CITRATE (PF) 100 MCG/2ML IJ SOLN
25.0000 ug | INTRAMUSCULAR | Status: DC | PRN
  Administered 2018-04-21: 100 ug via INTRAVENOUS
  Filled 2018-04-21: qty 2

## 2018-04-21 MED ORDER — LACTATED RINGERS IV SOLN
1000.0000 mL | Freq: Once | INTRAVENOUS | Status: AC
  Administered 2018-04-21: 1000 mL via INTRAVENOUS

## 2018-04-21 MED ORDER — MIDAZOLAM HCL 5 MG/5ML IJ SOLN
1.0000 mg | INTRAMUSCULAR | Status: DC | PRN
  Filled 2018-04-21: qty 5

## 2018-04-21 NOTE — Progress Notes (Signed)
Patient's Name: Joseph [name]  MRN: [id number]  Referring Provider: Doreene Nest, NP  DOB: November 26, 1971  PCP: Doreene Nest, NP  DOS: 04/21/2018  Note by: Oswaldo Done, MD  Service setting: Ambulatory outpatient  Specialty: Interventional Pain Management  Patient type: Established  Location: [facility] (AMB) Pain Management Facility  Visit type: Interventional Procedure   Primary Reason for Visit: Interventional Pain Management Treatment. CC: Knee Pain (left)  Procedure:  Anesthesia, Analgesia, Anxiolysis:  Type: 1.)Therapeutic Superior-lateral, Superior-medial, and Inferior-medial, Genicular Nerve Radiofrequency Ablation. 2.) Diagnostic/therapeutic left knee arthrocentesis Region: Lateral, Anterior, and Medial aspects of the knee joint, above and below the knee joint proper. Level: Superior and inferior to the knee joint. Laterality: Left  Type: Moderate (Conscious) Sedation combined with Local Anesthesia Indication(s): Analgesia and Anxiety Route: Intravenous (IV) IV Access: Secured Sedation: Meaningful verbal contact was maintained at all times during the procedure  Local Anesthetic: Lidocaine 1-2%   Indications: 1. Osteoarthritis of the knee (Left)   2. Chronic knee pain (Primary Area of Pain) (Bilateral) (L>R)   3. Arthropathy of left knee   4. Meniscal tear of knee, sequela (Left)    [name] has been dealing with the above chronic pain for longer than three months and has either failed to respond, was unable to tolerate, or simply did not get enough benefit from other more conservative therapies including, but not limited to: 1. Over-the-counter medications 2. Anti-inflammatory medications 3. Muscle relaxants 4. Membrane stabilizers 5. Opioids 6. Physical therapy 7. Modalities (Heat, ice, etc.) 8. Invasive techniques such as nerve blocks. [name] has attained more than 50% relief of the pain from a series of diagnostic injections conducted in separate  occasions.  Pain Score: Pre-procedure: 2 /10 Post-procedure: 0-No pain/10  Pre-op Assessment:  [name] is a 47 y.o. (year old), male patient, seen today for interventional treatment. He  has a past surgical history that includes Carpal tunnel release (Left); Shoulder Arthrocentesis (Right); Vasectomy; Hernia repair (Bilateral); Knee arthroscopy with medial menisectomy (Left, 04/22/2014); Knee arthroscopy with medial menisectomy (Right, 12/06/2015); Cataract extraction w/PHACO (Right, 03/11/2016); ORIF wrist fracture (Left, 12/19/2016); and Knee arthroscopy with medial menisectomy (Left, 03/20/2017). [name] has a current medication list which includes the following prescription(s): albuterol, androgel pump, atorvastatin, betamethasone dipropionate, bupropion, celecoxib, ciclopirox, cinnamon, citalopram, diphenhydramine, empagliflozin, fluticasone furoate-vilanterol, glipizide, ipratropium, ipratropium-albuterol, lansoprazole, metformin, multivitamin with minerals, oxycodone-acetaminophen, saw palmetto (serenoa repens), tamsulosin, tapentadol, and trazodone, and the following Facility-Administered Medications: fentanyl, lidocaine, and midazolam. His primarily concern today is the Knee Pain (left)  The patient comes in today clinics today limping with significant swelling of the left knee. He indicates that in the past his orthopedic surgeon has aspirated the knee removing significant amounts of synovial fluid. He indicates that when this is done he usually gets very good relief of the pain. He also indicates that the fluid tests to reaccumulate every 3 months or so. He has requested that we aspirate his knee for the procedure. Physical exam confirms that there is fluid in the knee and therefore we have decided to proceed with a knee arthrocentesis prior to the Genicular nerve RFA.  Initial Vital Signs:  Pulse/HCG Rate: 95ECG Heart Rate: 90 Temp: 98.6 F (37 C) Resp: 16 BP: 127/85 SpO2: 100 %  BMI:  Estimated body mass index is 34.58 kg/m as calculated from the following:   Height as of this encounter:  (1.778 m).   Weight as of this encounter: 241 lb (109.3 kg).  Risk Assessment:  Allergies: Reviewed. He is allergic to azithromycin; clarithromycin; erythromycin; keflex [cephalexin]; metformin; and symbicort [budesonide-formoterol fumarate].  Allergy Precautions: None required Coagulopathies: Reviewed. None identified.  Blood-thinner therapy: None at this time Active Infection(s): Reviewed. None identified. [name] is afebrile  Site Confirmation: [name] was asked to confirm the procedure and laterality before marking the site Procedure checklist: Completed Consent: Before the procedure and under the influence of no sedative(s), amnesic(s), or anxiolytics, the patient was informed of the treatment options, risks and possible complications. To fulfill our ethical and legal obligations, as recommended by the American Medical Association's Code of Ethics, I have informed the patient of my clinical impression; the nature and purpose of the treatment or procedure; the risks, benefits, and possible complications of the intervention; the alternatives, including doing nothing; the risk(s) and benefit(s) of the alternative treatment(s) or procedure(s); and the risk(s) and benefit(s) of doing nothing. The patient was provided information about the general risks and possible complications associated with the procedure. These may include, but are not limited to: failure to achieve desired goals, infection, bleeding, organ or nerve damage, allergic reactions, paralysis, and death. In addition, the patient was informed of those risks and complications associated to the procedure, such as failure to decrease pain; infection; bleeding; organ or nerve damage with subsequent damage to sensory, motor, and/or autonomic systems, resulting in permanent pain, numbness, and/or weakness of one or several areas  of the body; allergic reactions; (i.e.: anaphylactic reaction); and/or death. Furthermore, the patient was informed of those risks and complications associated with the medications. These include, but are not limited to: allergic reactions (i.e.: anaphylactic or anaphylactoid reaction(s)); adrenal axis suppression; blood sugar elevation that in diabetics may result in ketoacidosis or comma; water retention that in patients with history of congestive heart failure may result in shortness of breath, pulmonary edema, and decompensation with resultant heart failure; weight gain; swelling or edema; medication-induced neural toxicity; particulate matter embolism and blood vessel occlusion with resultant organ, and/or nervous system infarction; and/or aseptic necrosis of one or more joints. Finally, the patient was informed that Medicine is not an exact science; therefore, there is also the possibility of unforeseen or unpredictable risks and/or possible complications that may result in a catastrophic outcome. The patient indicated having understood very clearly. We have given the patient no guarantees and we have made no promises. Enough time was given to the patient to ask questions, all of which were answered to the patient's satisfaction. Mr. Nagengast has indicated that he wanted to continue with the procedure. Attestation: I, the ordering provider, attest that I have discussed with the patient the benefits, risks, side-effects, alternatives, likelihood of achieving goals, and potential problems during recovery for the procedure that I have provided informed consent. Date  Time: 04/21/2018  2:41 PM  Pre-Procedure Preparation:  Monitoring: As per clinic protocol. Respiration, ETCO2, SpO2, BP, heart rate and rhythm monitor placed and checked for adequate function Safety Precautions: Patient was assessed for positional comfort and pressure points before starting the procedure. Time-out: I initiated and conducted the  "Time-out" before starting the procedure, as per protocol. The patient was asked to participate by confirming the accuracy of the "Time Out" information. Verification of the correct person, site, and procedure were performed and confirmed by me, the nursing staff, and the patient. "Time-out" conducted as per Joint Commission's Universal Protocol (UP.01.01.01). Time: 1555  Description of Procedure Process:   Prior to the genicular nerve RFA, we had the patient sit up at the border  of the procedure table, the left knee was prepped, and 2% lidocaine was used to numb the skin over the left superior aspect of his patella. This was done initially with a 25G, 1.5 inch needle. An 18-gauge, 1.5 inch needle was then introduced and a total of 40cc of synovial fluid was aspirated. This was sent to the lab for further analysis. Once the procedure was completed, the duration was then repositioned for the genicular nerve RFA. Position: Supine Target Area: For Genicular Nerve block(s), the targets are: the superior-lateral genicular nerve, located in the lateral distal portion of the femoral shaft as it curves to form the lateral epicondyle, in the region of the distal femoral metaphysis; the superior-medial genicular nerve, located in the medial distal portion of the femoral shaft as it curves to form the medial epicondyle; and the inferior-medial genicular nerve, located in the medial, proximal portion of the tibial shaft, as it curves to form the medial epicondyle, in the region of the proximal tibial metaphysis. Approach: Anterior, ipsilateral approach. Area Prepped: Entire knee area, from mid-thigh to mid-shin, lateral, anterior, and medial aspects. Prepping solution: Hibiclens (4.0% Chlorhexidine gluconate solution) Safety Precautions: Aspiration looking for blood return was conducted prior to all injections. At no point did we inject any substances, as a needle was being advanced. No attempts were made at seeking  any paresthesias. Safe injection practices and needle disposal techniques used. Medications properly checked for expiration dates. SDV (single dose vial) medications used. Description of the Procedure: Protocol guidelines were followed. The patient was placed in position over the procedure table. The target area was identified and the area prepped in the usual manner. The skin and muscle were infiltrated with local anesthetic. Appropriate amount of time allowed to pass for local anesthetics to take effect. Radiofrequency needles were introduced to the target area using fluoroscopic guidance. Using the NeuroTherm NT1100 Radiofrequency Generator, sensory stimulation using 50 Hz was used to locate & identify the nerve, making sure that the needle was positioned such that there was no sensory stimulation below 0.3 V or above 0.7 V. Stimulation using 2 Hz was used to evaluate the motor component. Care was taken not to lesion any nerves that demonstrated motor stimulation of the lower extremities at an output of less than 2.5 times that of the sensory threshold, or a maximum of 2.0 V. Once satisfactory placement of the needles was achieved, the numbing solution was slowly injected after negative aspiration. After waiting for at least 2 minutes, the ablation was performed at 80 degrees C for 60 seconds, using regular Radiofrequency settings. Once the procedure was completed, the needles were then removed and the area cleansed, making sure to leave some of the prepping solution back to take advantage of its long term bactericidal properties. Intra-operative Compliance: Compliant Vitals:   04/21/18 1619 04/21/18 1624 04/21/18 1633 04/21/18 1643  BP: 120/81 (!) 126/116 127/77 102/90  Pulse:      Resp: Temp:      SpO2: 91% 94% 94% 95%  Weight:      Height:        Start Time: 1555 hrs. End Time: 1621 hrs. Materials & Medications:  Needle(s) Type: Teflon-coated, curved tip, Radiofrequency  needle(s) Gauge: 22G Length: 10cm Medication(s): Please see orders for medications and dosing details.  Imaging Guidance (Non-Spinal):  Type of Imaging Technique: Fluoroscopy Guidance (Non-Spinal) Indication(s): Assistance in needle guidance and placement for procedures requiring needle placement in or near specific anatomical locations not easily  accessible without such assistance. Exposure Time: Please see nurses notes. Contrast: Before injecting any contrast, we confirmed that the patient did not have an allergy to iodine, shellfish, or radiological contrast. Once satisfactory needle placement was completed at the desired level, radiological contrast was injected. Contrast injected under live fluoroscopy. No contrast complications. See chart for type and volume of contrast used. Fluoroscopic Guidance: I was personally present during the use of fluoroscopy. "Tunnel Vision Technique" used to obtain the best possible view of the target area. Parallax error corrected before commencing the procedure. "Direction-depth-direction" technique used to introduce the needle under continuous pulsed fluoroscopy. Once target was reached, antero-posterior, oblique, and lateral fluoroscopic projection used confirm needle placement in all planes. Images permanently stored in EMR. Interpretation: I personally interpreted the imaging intraoperatively. Adequate needle placement confirmed in multiple planes. Appropriate spread of contrast into desired area was observed. No evidence of afferent or efferent intravascular uptake. Permanent images saved into the patient's record.  Antibiotic Prophylaxis:   Anti-infectives (From admission, onward)   None     Indication(s): None identified  Post-operative Assessment:  Post-procedure Vital Signs:  Pulse/HCG Rate: 9585 Temp: 98.6 F (37 C) Resp: 20 BP: 102/90 SpO2: 95 %  EBL: None  Complications: No immediate post-treatment complications observed by team, or  reported by patient.  Note: The patient tolerated the entire procedure well. A repeat set of vitals were taken after the procedure and the patient was kept under observation following institutional policy, for this type of procedure. Post-procedural neurological assessment was performed, showing return to baseline, prior to discharge. The patient was provided with post-procedure discharge instructions, including a section on how to identify potential problems. Should any problems arise concerning this procedure, the patient was given instructions to immediately contact us, at any time, without hesitation. In any case, we plan to contact the patient by telephone for a follow-up status report regarding this interventional procedure.  Comments:  No additional relevant information.  Plan of Care   Imaging Orders     DG C-Arm 1-60 Min-No Report  Procedure Orders     Radiofrequency,Genicular     Large Joint Injection/Arthrocentesis  Lab Orders     Synovial fluid, cell count   Medications ordered for procedure: Meds ordered this encounter  Medications  . lidocaine (XYLOCAINE) 2 % (with pres) injection 400 mg  . midazolam (VERSED) 5 MG/5ML injection 1-2 mg    Make sure Flumazenil is available in the pyxis when using this medication. If oversedation occurs, administer 0.2 mg IV over 15 sec. If after 45 sec no response, administer 0.2 mg again over 1 min; may repeat at 1 min intervals; not to exceed 4 doses (1 mg)  . fentaNYL (SUBLIMAZE) injection 25-50 mcg    Make sure Narcan is available in the pyxis when using this medication. In the event of respiratory depression (RR< 8/min): Titrate NARCAN (naloxone) in increments of 0.1 to 0.2 mg IV at 2-3 minute intervals, until desired degree of reversal.  . lactated ringers infusion 1,000 mL  . ropivacaine (PF) 2 mg/mL (0.2%) (NAROPIN) injection 9 mL  . methylPREDNISolone acetate (DEPO-MEDROL) injection 80 mg  . oxyCODONE-acetaminophen (PERCOCET) 5-325  MG tablet    Sig: Take 1 tablet by mouth every 6 (six) hours as needed for up to 7 days for severe pain.    Dispense:  28 tablet    Refill:  0    For acute post-operative pain. Not to be refilled. To last 7 days.  Marland Kitchen lidocaine (XYLOCAINE)  2 % (with pres) injection 400 mg  . methylPREDNISolone acetate (DEPO-MEDROL) injection 80 mg   Medications administered: We administered lidocaine, fentaNYL, lactated ringers, ropivacaine (PF) 2 mg/mL (0.2%), methylPREDNISolone acetate, and methylPREDNISolone acetate.  See the medical record for exact dosing, route, and time of administration.  New Prescriptions   OXYCODONE-ACETAMINOPHEN (PERCOCET) 5-325 MG TABLET    Take 1 tablet by mouth every 6 (six) hours as needed for up to 7 days for severe pain.   Disposition: Discharge home  Discharge Date & Time: 04/21/2018; 1644 hrs.   Physician-requested Follow-up: Return for Post-RFA eval (6 wks), w/ Dr. Laban Emperor.  Future Appointments  Date Time Provider Department Center  05/07/2018 11:30 AM Barbette Merino, NP [facility] None  05/20/2018 10:30 AM Robert Bellow, LPN [facility]  05/26/2018  9:00 AM Doreene Nest, NP [facility]  06/10/2018  8:15 AM Delano Metz, MD [facility] None   Primary Care Physician: Doreene Nest, NP Location: [facility] Note by: Oswaldo Done, MD Date: 04/21/2018; Time: 6:03 PM  Disclaimer:  Medicine is not an Visual merchandiser. The only guarantee in medicine is that nothing is guaranteed. It is important to note that the decision to proceed with this intervention was based on the information collected from the patient. The Data and conclusions were drawn from the patient's questionnaire, the interview, and the physical examination. Because the information was provided in large part by the patient, it cannot be guaranteed that it has not been purposely or unconsciously manipulated. Every effort has been made to obtain as much relevant  data as possible for this evaluation. It is important to note that the conclusions that lead to this procedure are derived in large part from the available data. Always take into account that the treatment will also be dependent on availability of resources and existing treatment guidelines, considered by other Pain Management Practitioners as being common knowledge and practice, at the time of the intervention. For Medico-Legal purposes, it is also important to point out that variation in procedural techniques and pharmacological choices are the acceptable norm. The indications, contraindications, technique, and results of the above procedure should only be interpreted and judged by a Board-Certified Interventional Pain Specialist with extensive familiarity and expertise in the same exact procedure and technique.

## 2018-04-21 NOTE — Patient Instructions (Signed)

## 2018-04-22 ENCOUNTER — Telehealth: Payer: Self-pay | Admitting: Pain Medicine

## 2018-04-22 ENCOUNTER — Telehealth: Payer: Self-pay

## 2018-04-22 LAB — SYNOVIAL FLUID, CELL COUNT
EOS FL: 1 %
LYMPHS FL: 16 %
Lining Cells, Synovial: 0 %
MACROPHAGES FLD: 20 %
NUC CELL # FLD: 266 {cells}/uL — AB (ref 0–200)
Polys, Fluid: 63 %
RBC FL: 51000 /uL

## 2018-04-22 NOTE — Telephone Encounter (Signed)
Called and confirmed with pharmacy.

## 2018-04-22 NOTE — Telephone Encounter (Signed)
Pharmacy lvmail asking to verify medications, wants to make sure pain clinic knows he is taking Nucynta as well scripts turned in

## 2018-04-22 NOTE — Telephone Encounter (Signed)
Post procedure phone call.  Patient states he is doing good. States he needs a PA for the Percocet.

## 2018-04-30 ENCOUNTER — Other Ambulatory Visit: Payer: Self-pay | Admitting: Internal Medicine

## 2018-04-30 ENCOUNTER — Other Ambulatory Visit: Payer: Self-pay | Admitting: Primary Care

## 2018-05-01 ENCOUNTER — Ambulatory Visit (INDEPENDENT_AMBULATORY_CARE_PROVIDER_SITE_OTHER): Payer: Medicare Other | Admitting: Orthopedic Surgery

## 2018-05-01 VITALS — BP 126/77 | HR 91 | Ht 70.0 in | Wt 241.0 lb

## 2018-05-01 DIAGNOSIS — M25462 Effusion, left knee: Secondary | ICD-10-CM

## 2018-05-01 DIAGNOSIS — M1712 Unilateral primary osteoarthritis, left knee: Secondary | ICD-10-CM

## 2018-05-01 DIAGNOSIS — Z9889 Other specified postprocedural states: Secondary | ICD-10-CM | POA: Diagnosis not present

## 2018-05-01 NOTE — Progress Notes (Signed)
Progress Note   Patient ID: Joseph [name], male   DOB: [birth year], 47 y.o.   MRN: [id number]  Chief Complaint  Patient presents with  . Follow-up    Recheck left knee     Medical decision-making Encounter Diagnoses  Name Primary?  . S/P arthroscopy of left knee Yes  . Effusion of knee joint, left   . Arthritis of left knee      PLAN:  Aspiration and injection left knee 20 cc of fluid    No orders of the defined types were placed in this encounter.   MRI PRIOR TO SURGERY:  IMPRESSION: 1. Grade 3 signal in the posterior horn and midbody medial meniscus, suspicious for tear although occasionally postoperative signal can appear in a similar fashion. 2. Highly indistinct midportion of the PCL suspicious for partial tearing or severe degeneration. The ACL is more moderately degenerated. 3. Focal chondral defects along the posterior patellar ridge and centrally along the medial femoral condyles. Moderate chondral thinning in the medial compartment with mild chondral thinning in the patellofemoral joint. 4. Small to moderate knee effusion with mildly thickened medial plica. 5. Indistinct low signal medially in Hoffa's fat pad likely represents a small amount of fibrosis. 6. Very small Baker's cyst. 7. Prepatellar subcutaneous edema.     Electronically Signed   By: Gaylyn RongWalter  Liebkemann M.D.   On: 02/25/2017 16:12  OP NOTE  PATIENT:  Joseph ForthJerry R Mccard  47 y.o. male  PRE-OPERATIVE DIAGNOSIS:  left medial meniscus tear  POST-OPERATIVE DIAGNOSIS:  left medial meniscus tear  Findings  re-tear the posterior horn of the medial meniscus Chondral flaps of the medial femoral condyle Lateral compartment normal Degeneration of the anterior cruciate ligament but no tear Moderate synovitis of the knee joint Chondromalacia grade 2 of the patella  PROCEDURE:  Procedure(s): KNEE ARTHROSCOPY WITH MEDIAL MENISECTOMY (Left)  SURGEON:  Surgeon(s) and Role:    * Vickki HearingStanley E Negin Hegg, MD  - Primary      Chief Complaint  Patient presents with  . Follow-up    Recheck left knee    [age] year old male history of arthroscopy history of multiple joint effusions treated with aspiration injection presents with swollen left knee status post geniculate nerve block successful for helping with his knee pain    Review of Systems  Constitutional: Negative for fever.  Skin: Negative.   Neurological: Negative for tingling.   No outpatient medications have been marked as taking for the 05/01/18 encounter (Office Visit) with Vickki HearingHarrison, Zareah Hunzeker E, MD.    Allergies  Allergen Reactions  . Azithromycin Hives  . Clarithromycin   . Erythromycin Hives  . Keflex [Cephalexin] Nausea Only  . Metformin Nausea And Vomiting  . Symbicort [Budesonide-Formoterol Fumarate] Other (See Comments)    States that 3 doses were used and breathing became worse     BP 126/77   Pulse 91   Ht 5\' 10"  (1.778 m)   Wt 241 lb (109.3 kg)   BMI 34.58 kg/m   Physical Exam  Constitutional: He is oriented to person, place, and time. He appears well-developed and well-nourished.  Vital signs have been reviewed and are stable. Gen. appearance the patient is well-developed and well-nourished with normal grooming and hygiene.   Musculoskeletal:       Legs: Neurological: He is alert and oriented to person, place, and time.  Skin: Skin is warm and dry. No erythema.  Psychiatric: He has a normal mood and affect.  Vitals reviewed.      Bear StearnsStanley  Romeo Apple, MD 05/01/2018 10:28 AM

## 2018-05-06 ENCOUNTER — Ambulatory Visit: Payer: Medicare Other | Attending: Nurse Practitioner | Admitting: Nurse Practitioner

## 2018-05-06 ENCOUNTER — Encounter: Payer: Self-pay | Admitting: Nurse Practitioner

## 2018-05-06 ENCOUNTER — Other Ambulatory Visit: Payer: Self-pay

## 2018-05-06 VITALS — BP 133/85 | HR 82 | Temp 98.0°F | Resp 16 | Ht 70.0 in | Wt 241.0 lb

## 2018-05-06 DIAGNOSIS — G8929 Other chronic pain: Secondary | ICD-10-CM

## 2018-05-06 DIAGNOSIS — M25562 Pain in left knee: Secondary | ICD-10-CM

## 2018-05-06 DIAGNOSIS — M25561 Pain in right knee: Secondary | ICD-10-CM

## 2018-05-06 DIAGNOSIS — F419 Anxiety disorder, unspecified: Secondary | ICD-10-CM | POA: Diagnosis not present

## 2018-05-06 DIAGNOSIS — E291 Testicular hypofunction: Secondary | ICD-10-CM | POA: Diagnosis not present

## 2018-05-06 DIAGNOSIS — Z7951 Long term (current) use of inhaled steroids: Secondary | ICD-10-CM | POA: Diagnosis not present

## 2018-05-06 DIAGNOSIS — M25532 Pain in left wrist: Secondary | ICD-10-CM

## 2018-05-06 DIAGNOSIS — K219 Gastro-esophageal reflux disease without esophagitis: Secondary | ICD-10-CM | POA: Diagnosis not present

## 2018-05-06 DIAGNOSIS — Z9841 Cataract extraction status, right eye: Secondary | ICD-10-CM | POA: Insufficient documentation

## 2018-05-06 DIAGNOSIS — E119 Type 2 diabetes mellitus without complications: Secondary | ICD-10-CM | POA: Insufficient documentation

## 2018-05-06 DIAGNOSIS — J449 Chronic obstructive pulmonary disease, unspecified: Secondary | ICD-10-CM | POA: Insufficient documentation

## 2018-05-06 DIAGNOSIS — M1712 Unilateral primary osteoarthritis, left knee: Secondary | ICD-10-CM | POA: Insufficient documentation

## 2018-05-06 DIAGNOSIS — Z79899 Other long term (current) drug therapy: Secondary | ICD-10-CM | POA: Diagnosis not present

## 2018-05-06 DIAGNOSIS — G4700 Insomnia, unspecified: Secondary | ICD-10-CM | POA: Insufficient documentation

## 2018-05-06 DIAGNOSIS — Z888 Allergy status to other drugs, medicaments and biological substances status: Secondary | ICD-10-CM | POA: Insufficient documentation

## 2018-05-06 DIAGNOSIS — K5909 Other constipation: Secondary | ICD-10-CM | POA: Diagnosis not present

## 2018-05-06 DIAGNOSIS — Z7984 Long term (current) use of oral hypoglycemic drugs: Secondary | ICD-10-CM | POA: Insufficient documentation

## 2018-05-06 DIAGNOSIS — F17210 Nicotine dependence, cigarettes, uncomplicated: Secondary | ICD-10-CM | POA: Diagnosis not present

## 2018-05-06 DIAGNOSIS — F329 Major depressive disorder, single episode, unspecified: Secondary | ICD-10-CM | POA: Diagnosis not present

## 2018-05-06 DIAGNOSIS — Z881 Allergy status to other antibiotic agents status: Secondary | ICD-10-CM | POA: Diagnosis not present

## 2018-05-06 DIAGNOSIS — E785 Hyperlipidemia, unspecified: Secondary | ICD-10-CM | POA: Insufficient documentation

## 2018-05-06 DIAGNOSIS — G4733 Obstructive sleep apnea (adult) (pediatric): Secondary | ICD-10-CM | POA: Insufficient documentation

## 2018-05-06 DIAGNOSIS — G894 Chronic pain syndrome: Secondary | ICD-10-CM | POA: Insufficient documentation

## 2018-05-06 DIAGNOSIS — Z79891 Long term (current) use of opiate analgesic: Secondary | ICD-10-CM | POA: Diagnosis not present

## 2018-05-06 MED ORDER — NALOXEGOL OXALATE 25 MG PO TABS: 25.0000 mg | ORAL_TABLET | Freq: Every day | ORAL | 0 refills | Status: DC

## 2018-05-06 MED ORDER — TAPENTADOL HCL ER 50 MG PO TB12: 50.0000 mg | ORAL_TABLET | Freq: Three times a day (TID) | ORAL | 0 refills | Status: DC | PRN

## 2018-05-06 NOTE — Progress Notes (Signed)
Nursing Pain Medication Assessment:  Safety precautions to be maintained throughout the outpatient stay will include: orient to surroundings, keep bed in low position, maintain call bell within reach at all times, provide assistance with transfer out of bed and ambulation.  Medication Inspection Compliance: Pill count conducted under aseptic conditions, in front of the patient. Neither the pills nor the bottle was removed from the patient's sight at any time. Once count was completed pills were immediately returned to the patient in their original bottle.  Medication #1: Oxycodone/APAP Pill/Patch Count: 7 of 28 pills remain Pill/Patch Appearance: Markings consistent with prescribed medication Bottle Appearance: Standard pharmacy container. Clearly labeled. Filled Date: 5 / 22 / 2019 Last Medication intake:  Yesterday  Medication #2: nucynta Pill/Patch Count: 32 of 90 pills remain Pill/Patch Appearance: Markings consistent with prescribed medication Bottle Appearance: Standard pharmacy container. Clearly labeled. Filled Date: 5 / 3511 / 2019 Last Medication intake:  Yesterday

## 2018-05-06 NOTE — Progress Notes (Signed)
Patient's Name: Joseph [name]  MRN: [id number]  Referring Provider: Pleas Koch, NP  DOB: 1971/11/08  PCP: Pleas Koch, NP  DOS: 05/06/2018  Note by: Vevelyn Francois NP  Service setting: Ambulatory outpatient  Specialty: Interventional Pain Management  Location: [facility] (AMB) Pain Management Facility    Patient type: Established    Primary Reason(s) for Visit: Encounter for prescription drug management & post-procedure evaluation of chronic illness with mild to moderate exacerbation(Level of risk: moderate) CC: Knee Pain (left)  HPI  Joseph [name] is a 47 y.o. year old, male patient, who comes today for a post-procedure evaluation and medication management. He has Mediastinal abnormality; Cigarette smoker; Chronic pain syndrome; Diabetes ([place]); Intrinsic asthma; Medial meniscus, posterior horn derangement; S/P knee surgery; Meniscal tear of knee, sequela (Left); Testosterone deficiency; OSA (obstructive sleep apnea); Morbid obesity ([place]); Anxiety and depression; Bucket handle tear of meniscus of knee (Right); Skin lesions; GERD (gastroesophageal reflux disease); Osteoarthritis of the knee (Left); Post-void dribbling; COPD without exacerbation ([place]); Hyperlipidemia; Chronic constipation; Disorder of skeletal system; Chronic knee pain (Primary Area of Pain) (Bilateral) (L>R); Chronic wrist pain (Secondary Area of Pain) (Left); Insomnia; Male hypogonadism; Pharmacologic therapy; Problems influencing health status; Long term prescription opiate use; Opiate use; Tinea corporis; Tinea versicolor; Arthropathy of knee (Left); Opioid-induced constipation (OIC); Opiate overdose ([place]); Acute encephalopathy; Encephalopathy acute; Acute postoperative pain; and Effusion of knee joint (Left) on their problem list. His primarily concern today is the Knee Pain (left)  Pain Assessment: Location: Left Knee Radiating: denies Onset: More than a month ago Duration: Chronic pain Quality: Aching, Discomfort Severity:  1 /10 (subjective, self-reported pain score)  Note: Reported level is compatible with observation.                          Effect on ADL: prolonged walking Timing: Constant Modifying factors: procedure, rest BP: 133/85  HR: 82  Joseph [name] was last seen on 04/22/2018 for a procedure. During today's appointment we reviewed Joseph [name] post-procedure results, as well as his outpatient medication regimen. He admits that he is having a lot of pain relief with the Left knee Genicular RFA however it has only been 3 weeks since the RFA. He will follow up with the next apt as scheduled. He admits that he continues to take the medication. He admits that he thought that he would get more relief with the Percocet. However he admits that the [place] works better. He admits that once he has knee surgery he would like to discontinue the use of the medication.   Further details on both, my assessment(s), as well as the proposed treatment plan, please see below.  Controlled Substance Pharmacotherapy Assessment REMS (Risk Evaluation and Mitigation Strategy)  Analgesic:Nucynta 50 mg 3 times daily(150 mg/dayof Nucynta) MME/day:2m/day   Joseph Specking RN  05/06/2018  8:40 AM  Sign at close encounter Nursing Pain Medication Assessment:  Safety precautions to be maintained throughout the outpatient stay will include: orient to surroundings, keep bed in low position, maintain call bell within reach at all times, provide assistance with transfer out of bed and ambulation.  Medication Inspection Compliance: Pill count conducted under aseptic conditions, in front of the patient. Neither the pills nor the bottle was removed from the patient's sight at any time. Once count was completed pills were immediately returned to the patient in their original bottle.  Medication #1: Oxycodone/APAP Pill/Patch Count: 7 of 28 pills remain Pill/Patch Appearance: Markings consistent  with prescribed medication Bottle  Appearance: Standard pharmacy container. Clearly labeled. Filled Date: 5 / 22 / 2019 Last Medication intake:  Yesterday  Medication #2: nucynta Pill/Patch Count: 32 of 90 pills remain Pill/Patch Appearance: Markings consistent with prescribed medication Bottle Appearance: Standard pharmacy container. Clearly labeled. Filled Date: 5 / 11 / 2019 Last Medication intake:  Yesterday   Pharmacokinetics: Liberation and absorption (onset of action): WNL Distribution (time to peak effect): WNL Metabolism and excretion (duration of action): WNL         Pharmacodynamics: Desired effects: Analgesia: Joseph [name] reports >70% benefit. Functional ability: Patient reports that medication allows him to accomplish basic ADLs Clinically meaningful improvement in function (CMIF): Sustained CMIF goals met Perceived effectiveness: Described as relatively effective, allowing for increase in activities of daily living (ADL) Undesirable effects: Side-effects or Adverse reactions: None reported Monitoring: [place] PMP: Online review of the past 65-monthperiod conducted. Compliant with practice rules and regulations Last UDS on record: Summary  Date Value Ref Range Status  03/10/2018 FINAL  Final    Comment:    ==================================================================== TOXASSURE SELECT 13 (MW) ==================================================================== Test                             Result       Flag       Units Drug Present and Declared for Prescription Verification   Tapentadol                     [phone]      EXPECTED   ng/mg creat    Source of tapentadol is a scheduled prescription medication. ==================================================================== Test                      Result    Flag   Units      Ref Range   Creatinine              53               mg/dL      >=20 ==================================================================== Declared Medications:  The  flagging and interpretation on this report are based on the  following declared medications.  Unexpected results may arise from  inaccuracies in the declared medications.  **Note: The testing scope of this panel includes these medications:  Tapentadol  **Note: The testing scope of this panel does not include following  reported medications:  Albuterol  Albuterol (Ipratropium-Albuterol)  Amoxicillin  Atorvastatin  Betamethasone  Bupropion  Celecoxib  Ciclopirox  Citalopram  Diphenhydramine  Doxycycline  Empagliflozin ([place])  Fluconazole  Fluticasone  Glipizide  Ipratropium  Ipratropium (Ipratropium-Albuterol)  Lansoprazole  Lubiprostone  Metformin  Multivitamin  Naloxegol  Supplement  Supplement (Saw Palmetto)  Tamsulosin  Testosterone  Triamcinolone acetonide  Vilanterol ==================================================================== For clinical consultation, please call ([phone] ====================================================================    UDS interpretation: Compliant          Medication Assessment Form: Reviewed. Patient indicates being compliant with therapy Treatment compliance: Non-compliant. Steps taken to remind the patient of the seriousness of adequate therapy compliance Risk Assessment Profile: Aberrant behavior: See prior evaluations. None observed or detected today Comorbid factors increasing risk of overdose: age [id number] old, caucasian, history of non-compliance with medical advice, history of previous overdose and male gender Risk of substance use disorder (SUD): Very High  Accidental overdose  Opioid Risk Tool - 05/06/18 0834      Personal History of Substance  Abuse   Alcohol  Negative    Illegal Drugs  Negative    Rx Drugs  Negative      Age   Age between 40-45 years   No      History of Preadolescent Sexual Abuse   History of Preadolescent Sexual Abuse  Negative or Male positive male   positive male      Psychological Disease   Psychological Disease  Positive    ADD  Negative    OCD  Negative    Bipolar  Negative    Schizophrenia  Negative    Depression  Positive Ptsd   Ptsd     Total Score   Opioid Risk Tool Scoring  3    Opioid Risk Interpretation  Low Risk      ORT Scoring interpretation table:  Score <3 = Low Risk for SUD  Score between 4-7 = Moderate Risk for SUD  Score >8 = High Risk for Opioid Abuse   Risk Mitigation Strategies:  Patient Counseling: Covered Patient-Prescriber Agreement (PPA): Present and active  Notification to other healthcare providers: Done  Pharmacologic Plan: No change in therapy, at this time.            Change refills to monthly  Post-Procedure Assessment  04/21/2018 Procedure: Left Genicular RFA Pre-procedure pain score:  2/10 Post-procedure pain score: 0/10         Influential Factors: BMI: 34.58 kg/m Intra-procedural challenges: None observed.         Assessment challenges: None detected.              Reported side-effects: None.        Post-procedural adverse reactions or complications: None reported         Sedation: Please see nurses note. When no sedatives are used, the analgesic levels obtained are directly associated to the effectiveness of the local anesthetics. However, when sedation is provided, the level of analgesia obtained during the initial 1 hour following the intervention, is believed to be the result of a combination of factors. These factors may include, but are not limited to: 1. The effectiveness of the local anesthetics used. 2. The effects of the analgesic(s) and/or anxiolytic(s) used. 3. The degree of discomfort experienced by the patient at the time of the procedure. 4. The patients ability and reliability in recalling and recording the events. 5. The presence and influence of possible secondary gains and/or psychosocial factors. Reported result: Relief experienced during the 1st hour after the procedure: 100 %  (Ultra-Short Term Relief)            Interpretative annotation: Clinically appropriate result. Analgesia during this period is likely to be Local Anesthetic and/or IV Sedative (Analgesic/Anxiolytic) related.          Effects of local anesthetic: The analgesic effects attained during this period are directly associated to the localized infiltration of local anesthetics and therefore cary significant diagnostic value as to the etiological location, or anatomical origin, of the pain. Expected duration of relief is directly dependent on the pharmacodynamics of the local anesthetic used. Long-acting (4-6 hours) anesthetics used.  Reported result: Relief during the next 4 to 6 hour after the procedure: 100 % (Short-Term Relief)            Interpretative annotation: Clinically appropriate result. Analgesia during this period is likely to be Local Anesthetic-related.          Long-term benefit: Defined as the period of time past the expected duration of local anesthetics (  1 hour for short-acting and 4-6 hours for long-acting). With the possible exception of prolonged sympathetic blockade from the local anesthetics, benefits during this period are typically attributed to, or associated with, other factors such as analgesic sensory neuropraxia, antiinflammatory effects, or beneficial biochemical changes provided by agents other than the local anesthetics.  Reported result: Extended relief following procedure: 50 % (Long-Term Relief)            Interpretative annotation: Clinically appropriate result. Good relief. No permanent benefit expected. Inflammation plays a part in the etiology to the pain.          Current benefits: Defined as reported results that persistent at this point in time.   Analgesia: 50 %            Function: Joseph [name] reports improvement in function ROM: Somewhat improved Interpretative annotation: Good relief.                Interpretation: Results would suggest adequate radiofrequency  ablation.                  Plan:  Please see "Plan of Care" for details.                Laboratory Chemistry  Inflammation Markers (CRP: Acute Phase) (ESR: Chronic Phase) Lab Results  Component Value Date   CRP 0.7 11/05/2017   ESRSEDRATE 11 11/05/2017   LATICACIDVEN 2.1 01/24/2013                         Rheumatology Markers No results found for: RF, ANA, LABURIC, URICUR, LYMEIGGIGMAB, LYMEABIGMQN, HLAB27                      Renal Function Markers Lab Results  Component Value Date   BUN 7 03/27/2018   CREATININE 0.88 03/27/2018   BCR 10 11/05/2017   GFRAA >60 03/25/2018   GFRNONAA >60 03/25/2018                              Hepatic Function Markers Lab Results  Component Value Date   AST 11 (L) 03/25/2018   ALT 13 (L) 03/25/2018   ALBUMIN 3.6 03/25/2018   ALKPHOS 67 03/25/2018   LIPASE 38 01/24/2013                        Electrolytes Lab Results  Component Value Date   NA 140 03/27/2018   K 4.0 03/27/2018   CL 102 03/27/2018   CALCIUM 9.1 03/27/2018   MG 2.1 11/05/2017                        Neuropathy Markers Lab Results  Component Value Date   VITAMINB12 341 11/05/2017   HGBA1C 6.7 (H) 03/24/2018   HIV Non Reactive 03/24/2018                        Bone Pathology Markers Lab Results  Component Value Date   25OHVITD1 42 11/05/2017   25OHVITD2 <1.0 11/05/2017   25OHVITD3 42 11/05/2017   TESTOSTERONE 208.98 (L) 04/17/2017                         Coagulation Parameters Lab Results  Component Value Date   PLT 173 03/25/2018   DDIMER 0.46 03/07/2012  Cardiovascular Markers Lab Results  Component Value Date   TROPONINI <0.30 12/09/2013   HGB 13.8 03/25/2018   HCT 42.3 03/25/2018                         CA Markers No results found for: CEA, CA125, LABCA2                      Note: Lab results reviewed.  Recent Diagnostic Imaging Results  DG C-Arm 1-60 Min-No Report Fluoroscopy was utilized by the requesting  physician.  No radiographic  interpretation.   Complexity Note: Imaging results reviewed. Results shared with Joseph [name], using Layman's terms.                         Meds   Current Outpatient Medications:  .  albuterol (PROVENTIL) (2.5 MG/3ML) 0.083% nebulizer solution, USE 1 VIAL VIA NEBULIZER FOUR TIMES DAILY AS NEEDED FOR SHORTNESS OF BREATH, Disp: 360 mL, Rfl: 0 .  ANDROGEL PUMP 20.25 MG/ACT (1.62%) GEL, Apply 2 Pump topically daily. , Disp: , Rfl:  .  atorvastatin (LIPITOR) 20 MG tablet, Take 1 tablet (20 mg total) by mouth every evening., Disp: 90 tablet, Rfl: 3 .  betamethasone dipropionate (DIPROLENE) 0.05 % cream, Apply topically 2 (two) times daily as needed. , Disp: , Rfl:  .  buPROPion (WELLBUTRIN SR) 150 MG 12 hr tablet, TAKE ONE TABLET BY MOUTH TWICE A DAY, Disp: 180 tablet, Rfl: 1 .  celecoxib (CELEBREX) 200 MG capsule, Take 200 mg by mouth 2 (two) times daily. , Disp: , Rfl:  .  ciclopirox (LOPROX) 0.77 % cream, Apply topically 2 (two) times daily., Disp: 30 g, Rfl: 0 .  CINNAMON PO, Take 2,800 mg by mouth daily. EACH TABLET IS 1400 MG.  TAKES 2 CAPSULES DAILY, Disp: , Rfl:  .  citalopram (CELEXA) 40 MG tablet, TAKE ONE (1) TABLET BY MOUTH EVERY DAY, Disp: 30 tablet, Rfl: 11 .  diphenhydrAMINE (BENADRYL) 25 MG tablet, Take 25 mg by mouth 2 (two) times daily., Disp: , Rfl:  .  empagliflozin (JARDIANCE) 10 MG TABS tablet, Take 10 mg by mouth every morning., Disp: 90 tablet, Rfl: 2 .  fluticasone furoate-vilanterol (BREO ELLIPTA) 200-25 MCG/INH AEPB, Inhale 1 puff into the lungs daily., Disp: 30 each, Rfl: 11 .  glipiZIDE (GLUCOTROL) 5 MG tablet, TAKE TWO TABLETS BY MOUTH TWICE DAILY, Disp: 360 tablet, Rfl: 1 .  ipratropium (ATROVENT) 0.02 % nebulizer solution, USE 1 VIAL VIA NEBULIZER FOUR TIMES DAILY AS NEEDED, Disp: 300 mL, Rfl: 0 .  Ipratropium-Albuterol (COMBIVENT) 20-100 MCG/ACT AERS respimat, Inhale 1 puff into the lungs every 6 (six) hours as needed for wheezing., Disp: 1  Inhaler, Rfl: 2 .  lansoprazole (PREVACID) 30 MG capsule, TAKE ONE CAPSULE BY MOUTH TWICE A DAY BEFORE A MEAL, Disp: 180 capsule, Rfl: 1 .  metFORMIN (GLUCOPHAGE-XR) 500 MG 24 hr tablet, Take 2 tablets (1,000 mg total) by mouth daily with breakfast., Disp: , Rfl:  .  Multiple Vitamin (MULTIVITAMIN WITH MINERALS) TABS, Take 1 tablet by mouth every morning. Men's once daily multivitamin packet (vitamin e, calcium, ginseng), Disp: , Rfl:  .  Saw Palmetto, Serenoa repens, (SAW PALMETTO PO), Take 1 capsule by mouth daily., Disp: , Rfl:  .  tamsulosin (FLOMAX) 0.4 MG CAPS capsule, TAKE ONE (1) CAPSULE EACH DAY, Disp: 90 capsule, Rfl: 1 .  [START ON 05/16/2018] tapentadol (NUCYNTA) 50 MG 12  hr tablet, Take 1 tablet (50 mg total) by mouth every 8 (eight) hours as needed. Do not take if you have epilepsy or a history of seizures. Swallow tablets whole. Do not chew, crush or dissolve., Disp: 60 tablet, Rfl: 0 .  traZODone (DESYREL) 150 MG tablet, Take 1 tablet (150 mg total) by mouth at bedtime., Disp: 90 tablet, Rfl: 1 .  naloxegol oxalate (MOVANTIK) 25 MG TABS tablet, Take 1 tablet (25 mg total) by mouth daily. Take on an empty stomach at least 1 hour before or 2 hours after a meal., Disp: 30 tablet, Rfl: 0  ROS  Constitutional: Denies any fever or chills Gastrointestinal: No reported hemesis, hematochezia, vomiting, or acute GI distress Musculoskeletal: Denies any acute onset joint swelling, redness, loss of ROM, or weakness Neurological: No reported episodes of acute onset apraxia, aphasia, dysarthria, agnosia, amnesia, paralysis, loss of coordination, or loss of consciousness  Allergies  Joseph [name] is allergic to azithromycin; clarithromycin; erythromycin; keflex [cephalexin]; metformin; and symbicort [budesonide-formoterol fumarate].  [place]  Drug: Joseph [name]  reports that he does not use drugs. Alcohol:  reports that he does not drink alcohol. Tobacco:  reports that he has been smoking cigarettes.  He  started smoking about 24 years ago. He has a 30.00 pack-year smoking history. He has quit using smokeless tobacco. Medical:  has a past medical history of Anxiety, Arthritis, Asthma, Chronic back pain, Chronic pain of left knee, COPD (chronic obstructive pulmonary disease) ([place]), Diabetes mellitus, GERD (gastroesophageal reflux disease), INGUINAL PAIN, LEFT (10/03/2009), Overdose, Shortness of breath, and Sleep apnea. Surgical: Joseph [name]  has a past surgical history that includes Carpal tunnel release (Left); Shoulder Arthrocentesis (Right); Vasectomy; Hernia repair (Bilateral); Knee arthroscopy with medial menisectomy (Left, 04/22/2014); Knee arthroscopy with medial menisectomy (Right, 12/06/2015); Cataract extraction w/PHACO (Right, 03/11/2016); ORIF wrist fracture (Left, 12/19/2016); and Knee arthroscopy with medial menisectomy (Left, 03/20/2017). Family: family history includes Clotting disorder in his maternal grandmother; Emphysema in his maternal grandfather and maternal grandmother.  Constitutional Exam  General appearance: Well nourished, well developed, and well hydrated. In no apparent acute distress Vitals:   05/06/18 0826  BP: 133/85  Pulse: 82  Resp: 16  Temp: 98 F (36.7 C)  SpO2: 99%  Weight: 241 lb (109.3 kg)  Height: '5\' 10"'  (1.778 m)  Psych/Mental status: Alert, oriented x 3 (person, place, & time)       Eyes: PERLA Respiratory: No evidence of acute respiratory distress   Gait & Posture Assessment  Ambulation: Unassisted Gait: Relatively normal for age and body habitus Posture: WNL   Lower Extremity Exam    Side: Right lower extremity  Side: Left lower extremity  Stability: No instability observed          Stability: No instability observed          Skin & Extremity Inspection: Skin color, temperature, and hair growth are WNL. No peripheral edema or cyanosis. No masses, redness, swelling, asymmetry, or associated skin lesions. No contractures.  Skin & Extremity Inspection:  Skin color, temperature, and hair growth are WNL. No peripheral edema or cyanosis. No masses, redness, swelling, asymmetry, or associated skin lesions. No contractures.  Functional ROM: Unrestricted ROM                  Functional ROM: Unrestricted ROM                  Muscle Tone/Strength: Functionally intact. No obvious neuro-muscular anomalies detected.  Muscle Tone/Strength: Functionally intact. No obvious  neuro-muscular anomalies detected.  Sensory (Neurological): Unimpaired  Sensory (Neurological): Unimpaired  Palpation: No palpable anomalies  Palpation: No palpable anomalies   Assessment  Primary Diagnosis & Pertinent Problem List: The primary encounter diagnosis was Osteoarthritis of the knee (Left). Diagnoses of Chronic wrist pain (Secondary Area of Pain) (Left), Chronic knee pain (Primary Area of Pain) (Bilateral) (L>R), Chronic pain syndrome, and Long term prescription opiate use were also pertinent to this visit.  Status Diagnosis  Controlled Controlled Controlled 1. Osteoarthritis of the knee (Left)   2. Chronic wrist pain (Secondary Area of Pain) (Left)   3. Chronic knee pain (Primary Area of Pain) (Bilateral) (L>R)   4. Chronic pain syndrome   5. Long term prescription opiate use     Problems updated and reviewed during this visit: Problem  Opiate Overdose ([facility])   Plan of Care  Pharmacotherapy (Medications Ordered): Meds ordered this encounter  Medications  . tapentadol (NUCYNTA) 50 MG 12 hr tablet    Sig: Take 1 tablet (50 mg total) by mouth every 8 (eight) hours as needed. Do not take if you have epilepsy or a history of seizures. Swallow tablets whole. Do not chew, crush or dissolve.    Dispense:  60 tablet    Refill:  0    Do not place this medication, or any other prescription from our practice, on "Automatic Refill". Patient may have prescription filled one day early if pharmacy is closed on scheduled refill date. Do not fill until: 05/16/2018 To last  until:06/15/2018    Order Specific Question:   Supervising Provider    Answer:   Milinda Pointer [phone]  . naloxegol oxalate (MOVANTIK) 25 MG TABS tablet    Sig: Take 1 tablet (25 mg total) by mouth daily. Take on an empty stomach at least 1 hour before or 2 hours after a meal.    Dispense:  30 tablet    Refill:  0    Please instruct the patient not to break the tablet.    Order Specific Question:   Supervising Provider    Answer:   Milinda Pointer [phone]   New Prescriptions   NALOXEGOL OXALATE (MOVANTIK) 25 MG TABS TABLET    Take 1 tablet (25 mg total) by mouth daily. Take on an empty stomach at least 1 hour before or 2 hours after a meal.   Medications administered today: Arn Medal "Joseph [name]" had no medications administered during this visit. Lab-work, procedure(s), and/or referral(s): Orders Placed This Encounter  Procedures  . ToxASSURE Select 13 (MW), Urine   Imaging and/or referral(s): None  Interventional therapies: Planned, scheduled, and/or pending: Not at this time   Considering: Diagnostic Left intra-articular knee injection Diagnostic Left genicular nerve block Possible Left genicular RFA Diagnostic Left Hyalgan series   Palliative PRN treatment(s): Not at this time.      Provider-requested follow-up: Return in about 4 weeks (around 06/01/2018) for MedMgmt with Me Donella Stade [place]) This day.  Future Appointments  Date Time Provider [place]  05/20/2018 10:30 AM Eustace Pen, LPN [facility]  [id number]  9:00 AM Pleas Koch, NP [facility]  06/01/2018  9:00 AM Vevelyn Francois, NP [facility] None  06/10/2018  8:15 AM Milinda Pointer, MD [facility] None   Primary Care Physician: Pleas Koch, NP Location: [facility] Outpatient Pain Management Facility Note by: Vevelyn Francois NP Date: 05/06/2018; Time: 10:41 AM  Pain Score Disclaimer: We use the NRS-11 scale. This is a self-reported, subjective measurement of pain  severity  with only modest accuracy. It is used primarily to identify changes within a particular patient. It must be understood that outpatient pain scales are significantly less accurate that those used for research, where they can be applied under ideal controlled circumstances with minimal exposure to variables. In reality, the score is likely to be a combination of pain intensity and pain affect, where pain affect describes the degree of emotional arousal or changes in action readiness caused by the sensory experience of pain. Factors such as social and work situation, setting, emotional state, anxiety levels, expectation, and prior pain experience may influence pain perception and show large inter-individual differences that may also be affected by time variables.  Patient instructions provided during this appointment: Patient Instructions  ____________________________________________________________________________________________  Medication Rules  Applies to: All patients receiving prescriptions (written or electronic).  Pharmacy of record: Pharmacy where electronic prescriptions will be sent. If written prescriptions are taken to a different pharmacy, please inform the nursing staff. The pharmacy listed in the electronic medical record should be the one where you would like electronic prescriptions to be sent.  Prescription refills: Only during scheduled appointments. Applies to both, written and electronic prescriptions.  NOTE: The following applies primarily to controlled substances (Opioid* Pain Medications).   Patient's responsibilities: 1. Pain Pills: Bring all pain pills to every appointment (except for procedure appointments). 2. Pill Bottles: Bring pills in original pharmacy bottle. Always bring newest bottle. Bring bottle, even if empty. 3. Medication refills: You are responsible for knowing and keeping track of what medications you need refilled. The day before your appointment,  write a list of all prescriptions that need to be refilled. Bring that list to your appointment and give it to the admitting nurse. Prescriptions will be written only during appointments. If you forget a medication, it will not be "Called in", "Faxed", or "electronically sent". You will need to get another appointment to get these prescribed. 4. Prescription Accuracy: You are responsible for carefully inspecting your prescriptions before leaving our office. Have the discharge nurse carefully go over each prescription with you, before taking them home. Make sure that your name is accurately spelled, that your address is correct. Check the name and dose of your medication to make sure it is accurate. Check the number of pills, and the written instructions to make sure they are clear and accurate. Make sure that you are given enough medication to last until your next medication refill appointment. 5. Taking Medication: Take medication as prescribed. Never take more pills than instructed. Never take medication more frequently than prescribed. Taking less pills or less frequently is permitted and encouraged, when it comes to controlled substances (written prescriptions).  6. Inform other Doctors: Always inform, all of your healthcare providers, of all the medications you take. 7. Pain Medication from other Providers: You are not allowed to accept any additional pain medication from any other Doctor or Healthcare provider. There are two exceptions to this rule. (see below) In the event that you require additional pain medication, you are responsible for notifying us, as stated below. 8. Medication Agreement: You are responsible for carefully reading and following our Medication Agreement. This must be signed before receiving any prescriptions from our practice. Safely store a copy of your signed Agreement. Violations to the Agreement will result in no further prescriptions. (Additional copies of our Medication  Agreement are available upon request.) 9. Laws, Rules, & Regulations: All patients are expected to follow all Federal and [employer], Rules, [employer]. Ignorance  of the Laws does not constitute a valid excuse. The use of any illegal substances is prohibited. 10. Adopted CDC guidelines & recommendations: Target dosing levels will be at or below 60 MME/day. Use of benzodiazepines** is not recommended.  Exceptions: There are only two exceptions to the rule of not receiving pain medications from other Healthcare Providers. 1. Exception #1 (Emergencies): In the event of an emergency (i.e.: accident requiring emergency care), you are allowed to receive additional pain medication. However, you are responsible for: As soon as you are able, call our office [phone], at any time of the day or night, and leave a message stating your name, the date and nature of the emergency, and the name and dose of the medication prescribed. In the event that your call is answered by a member of our staff, make sure to document and save the date, time, and the name of the person that took your information.  2. Exception #2 (Planned Surgery): In the event that you are scheduled by another doctor or dentist to have any type of surgery or procedure, you are allowed (for a period no longer than 30 days), to receive additional pain medication, for the acute post-op pain. However, in this case, you are responsible for picking up a copy of our "Post-op Pain Management for Surgeons" handout, and giving it to your surgeon or dentist. This document is available at our office, and does not require an appointment to obtain it. Simply go to our office during business hours (Monday-Thursday from 8:00 AM to 4:00 PM) (Friday 8:00 AM to 12:00 Noon) or if you have a scheduled appointment with [place], prior to your surgery, and ask for it by name. In addition, you will need to provide [place] with your name, name of your surgeon, type of  surgery, and date of procedure or surgery.  *Opioid medications include: morphine, codeine, oxycodone, oxymorphone, hydrocodone, hydromorphone, meperidine, tramadol, tapentadol, buprenorphine, fentanyl, methadone. **Benzodiazepine medications include: diazepam (Valium), alprazolam (Xanax), clonazepam (Klonopine), lorazepam (Ativan), clorazepate (Tranxene), chlordiazepoxide (Librium), estazolam (Prosom), oxazepam (Serax), temazepam (Restoril), triazolam (Halcion) (Last updated: 01/29/2018) ____________________________________________________________________________________________

## 2018-05-06 NOTE — Patient Instructions (Signed)
____________________________________________________________________________________________  Medication Rules  Applies to: All patients receiving prescriptions (written or electronic).  Pharmacy of record: Pharmacy where electronic prescriptions will be sent. If written prescriptions are taken to a different pharmacy, please inform the nursing staff. The pharmacy listed in the electronic medical record should be the one where you would like electronic prescriptions to be sent.  Prescription refills: Only during scheduled appointments. Applies to both, written and electronic prescriptions.  NOTE: The following applies primarily to controlled substances (Opioid* Pain Medications).   Patient's responsibilities: 1. Pain Pills: Bring all pain pills to every appointment (except for procedure appointments). 2. Pill Bottles: Bring pills in original pharmacy bottle. Always bring newest bottle. Bring bottle, even if empty. 3. Medication refills: You are responsible for knowing and keeping track of what medications you need refilled. The day before your appointment, write a list of all prescriptions that need to be refilled. Bring that list to your appointment and give it to the admitting nurse. Prescriptions will be written only during appointments. If you forget a medication, it will not be "Called in", "Faxed", or "electronically sent". You will need to get another appointment to get these prescribed. 4. Prescription Accuracy: You are responsible for carefully inspecting your prescriptions before leaving our office. Have the discharge nurse carefully go over each prescription with you, before taking them home. Make sure that your name is accurately spelled, that your address is correct. Check the name and dose of your medication to make sure it is accurate. Check the number of pills, and the written instructions to make sure they are clear and accurate. Make sure that you are given enough medication to last  until your next medication refill appointment. 5. Taking Medication: Take medication as prescribed. Never take more pills than instructed. Never take medication more frequently than prescribed. Taking less pills or less frequently is permitted and encouraged, when it comes to controlled substances (written prescriptions).  6. Inform other Doctors: Always inform, all of your healthcare providers, of all the medications you take. 7. Pain Medication from other Providers: You are not allowed to accept any additional pain medication from any other Doctor or Healthcare provider. There are two exceptions to this rule. (see below) In the event that you require additional pain medication, you are responsible for notifying us, as stated below. 8. Medication Agreement: You are responsible for carefully reading and following our Medication Agreement. This must be signed before receiving any prescriptions from our practice. Safely store a copy of your signed Agreement. Violations to the Agreement will result in no further prescriptions. (Additional copies of our Medication Agreement are available upon request.) 9. Laws, Rules, & Regulations: All patients are expected to follow all Federal and State Laws, Statutes, Rules, & Regulations. Ignorance of the Laws does not constitute a valid excuse. The use of any illegal substances is prohibited. 10. Adopted CDC guidelines & recommendations: Target dosing levels will be at or below 60 MME/day. Use of benzodiazepines** is not recommended.  Exceptions: There are only two exceptions to the rule of not receiving pain medications from other Healthcare Providers. 1. Exception #1 (Emergencies): In the event of an emergency (i.e.: accident requiring emergency care), you are allowed to receive additional pain medication. However, you are responsible for: As soon as you are able, call our office [phone], at any time of the day or night, and leave a message stating your name, the  date and nature of the emergency, and the name and dose of the medication   prescribed. In the event that your call is answered by a member of our staff, make sure to document and save the date, time, and the name of the person that took your information.  2. Exception #2 (Planned Surgery): In the event that you are scheduled by another doctor or dentist to have any type of surgery or procedure, you are allowed (for a period no longer than 30 days), to receive additional pain medication, for the acute post-op pain. However, in this case, you are responsible for picking up a copy of our "Post-op Pain Management for Surgeons" handout, and giving it to your surgeon or dentist. This document is available at our office, and does not require an appointment to obtain it. Simply go to our office during business hours (Monday-Thursday from 8:00 AM to 4:00 PM) (Friday 8:00 AM to 12:00 Noon) or if you have a scheduled appointment with us, prior to your surgery, and ask for it by name. In addition, you will need to provide us with your name, name of your surgeon, type of surgery, and date of procedure or surgery.  *Opioid medications include: morphine, codeine, oxycodone, oxymorphone, hydrocodone, hydromorphone, meperidine, tramadol, tapentadol, buprenorphine, fentanyl, methadone. **Benzodiazepine medications include: diazepam (Valium), alprazolam (Xanax), clonazepam (Klonopine), lorazepam (Ativan), clorazepate (Tranxene), chlordiazepoxide (Librium), estazolam (Prosom), oxazepam (Serax), temazepam (Restoril), triazolam (Halcion) (Last updated: 01/29/2018) ____________________________________________________________________________________________    

## 2018-05-07 ENCOUNTER — Encounter: Payer: Medicare Other | Admitting: Nurse Practitioner

## 2018-05-11 ENCOUNTER — Other Ambulatory Visit: Payer: Self-pay | Admitting: Primary Care

## 2018-05-11 LAB — TOXASSURE SELECT 13 (MW), URINE

## 2018-05-18 ENCOUNTER — Other Ambulatory Visit: Payer: Self-pay | Admitting: Pain Medicine

## 2018-05-18 ENCOUNTER — Telehealth: Payer: Self-pay

## 2018-05-18 DIAGNOSIS — G894 Chronic pain syndrome: Secondary | ICD-10-CM

## 2018-05-18 MED ORDER — TAPENTADOL HCL 50 MG PO TABS: 50.0000 mg | ORAL_TABLET | Freq: Three times a day (TID) | ORAL | 0 refills | Status: DC | PRN

## 2018-05-18 NOTE — Telephone Encounter (Signed)
Attempted to call Mr Joseph [name]. No answer. Left message on machine to picked up new Rx for Nycenta

## 2018-05-18 NOTE — Telephone Encounter (Signed)
Talked with Pharm. They are going to mail thew current Rx back and we will have Mr Joseph [name] to pick up new prescription.

## 2018-05-18 NOTE — Telephone Encounter (Signed)
[facility] called for clarification of Nycenta order. States Rx is Nucynta 50mg  12hour  1 tab every 8 hours prn, 60 tabs. Will call Pharmacy back once I get clarfication.

## 2018-05-19 ENCOUNTER — Other Ambulatory Visit: Payer: Self-pay | Admitting: Primary Care

## 2018-05-19 DIAGNOSIS — G4700 Insomnia, unspecified: Secondary | ICD-10-CM

## 2018-05-19 DIAGNOSIS — E785 Hyperlipidemia, unspecified: Secondary | ICD-10-CM

## 2018-05-19 NOTE — Telephone Encounter (Signed)
Electronic refill request Last office visit 03/27/18 Last refill 11/20/17 #90/1

## 2018-05-19 NOTE — Telephone Encounter (Signed)
Noted, refill sent to pharmacy. 

## 2018-05-20 ENCOUNTER — Ambulatory Visit (INDEPENDENT_AMBULATORY_CARE_PROVIDER_SITE_OTHER): Payer: Medicare Other

## 2018-05-20 VITALS — BP 104/76 | HR 81 | Temp 98.4°F | Ht 69.25 in | Wt 236.8 lb

## 2018-05-20 DIAGNOSIS — E785 Hyperlipidemia, unspecified: Secondary | ICD-10-CM

## 2018-05-20 DIAGNOSIS — E119 Type 2 diabetes mellitus without complications: Secondary | ICD-10-CM | POA: Diagnosis not present

## 2018-05-20 DIAGNOSIS — Z0000 Encounter for general adult medical examination without abnormal findings: Secondary | ICD-10-CM | POA: Insufficient documentation

## 2018-05-20 LAB — LIPID PANEL
Cholesterol: 126 mg/dL (ref 0–200)
HDL: 36.5 mg/dL — ABNORMAL LOW (ref >39.00)
NONHDL: 89.08
Total CHOL/HDL Ratio: 3
Triglycerides: 221 mg/dL — ABNORMAL HIGH (ref 0.0–149.0)
VLDL: 44.2 mg/dL — ABNORMAL HIGH (ref 0.0–40.0)

## 2018-05-20 LAB — LDL CHOLESTEROL, DIRECT: LDL DIRECT: 69 mg/dL

## 2018-05-20 NOTE — Progress Notes (Signed)
PCP notes:   Health maintenance:  PPSV23 - PCP please address at next appt Microalbumin - pt will complete at next appt with PCP  Abnormal screenings:   Depression score: 7 Depression screen [facility]HQ 2/9 05/20/2018 05/06/2018 04/21/2018 03/10/2018 02/10/2018  Decreased Interest 1 0 0 0 0  Down, Depressed, Hopeless 1 0 0 0 0  PHQ - 2 Score 2 0 0 0 0  Altered sleeping 1 - - - -  Tired, decreased energy 2 - - - -  Change in appetite 1 - - - -  Feeling bad or failure about yourself  0 - - - -  Trouble concentrating 1 - - - -  Moving slowly or fidgety/restless 0 - - - -  Suicidal thoughts 0 - - - -  PHQ-9 Score 7 - - - -  Difficult doing work/chores Somewhat difficult - - - -  Some recent data might be hidden   Mini-Cog score: 17/20 MMSE - Mini Mental State Exam 05/20/2018  Orientation to time 5  Orientation to Place 5  Registration 3  Attention/ Calculation 0  Recall 0  Recall-comments unable to recall 3 of 3 words  Language- name 2 objects 0  Language- repeat 1  Language- follow 3 step command 3  Language- read & follow direction 0  Write a sentence 0  Copy design 0  Total score 17    Patient concerns:   Patient reports increased lethargy. Acute visit scheduled with PCP.   Patient reports pending consult for a left knee replacement.   Nurse concerns:  Patient has verbalized desire to have PPSV23 but does not feel well today.   Next PCP appt:   05/21/18 @ 0915

## 2018-05-20 NOTE — Patient Instructions (Signed)
Joseph [name] , Thank you for taking time to come for your Medicare Wellness Visit. I appreciate your ongoing commitment to your health goals. Please review the following plan we discussed and let me know if I can assist you in the future.   These are the goals we discussed: Goals    . Patient Stated     Starting 05/20/2018, I will continue to take medications as prescribed.        This is a list of the screening recommended for you and due dates:  Health Maintenance  Topic Date Due  . Pneumococcal vaccine (2) 06/01/2019*  . Flu Shot  07/02/2018  . Complete foot exam   08/18/2018  . Hemoglobin A1C  09/23/2018  . Eye exam for diabetics  11/03/2018  . Urine Protein Check  05/21/2019  . Tetanus Vaccine  10/22/2027  . HIV Screening  Completed  *Topic was postponed. The date shown is not the original due date.   Preventive Care for Adults  A healthy lifestyle and preventive care can promote health and wellness. Preventive health guidelines for adults include the following key practices.  . A routine yearly physical is a good way to check with your health care provider about your health and preventive screening. It is a chance to share any concerns and updates on your health and to receive a thorough exam.  . Visit your dentist for a routine exam and preventive care every 6 months. Brush your teeth twice a day and floss once a day. Good oral hygiene prevents tooth decay and gum disease.  . The frequency of eye exams is based on your age, health, family medical history, use  of contact lenses, and other factors. Follow your health care provider's recommendations for frequency of eye exams.  . Eat a healthy diet. Foods like vegetables, fruits, whole grains, low-fat dairy products, and lean protein foods contain the nutrients you need without too many calories. Decrease your intake of foods high in solid fats, added sugars, and salt. Eat the right amount of calories for you. Get information  about a proper diet from your health care provider, if necessary.  . Regular physical exercise is one of the most important things you can do for your health. Most adults should get at least 150 minutes of moderate-intensity exercise (any activity that increases your heart rate and causes you to sweat) each week. In addition, most adults need muscle-strengthening exercises on 2 or more days a week.  Silver Sneakers may be a benefit available to you. To determine eligibility, you may visit the website: www.silversneakers.com or contact program at [phone] Mon-Fri between 8AM-8PM.   . Maintain a healthy weight. The body mass index (BMI) is a screening tool to identify possible weight problems. It provides an estimate of body fat based on height and weight. Your health care provider can find your BMI and can help you achieve or maintain a healthy weight.   For adults 20 years and older: ? A BMI below 18.5 is considered underweight. ? A BMI of 18.5 to 24.9 is normal. ? A BMI of 25 to 29.9 is considered overweight. ? A BMI of 30 and above is considered obese.   . Maintain normal blood lipids and cholesterol levels by exercising and minimizing your intake of saturated fat. Eat a balanced diet with plenty of fruit and vegetables. Blood tests for lipids and cholesterol should begin at age 48 and be repeated every 5 years. If your lipid or cholesterol levels  are high, you are over 50, or you are at high risk for heart disease, you may need your cholesterol levels checked more frequently. Ongoing high lipid and cholesterol levels should be treated with medicines if diet and exercise are not working.  . If you smoke, find out from your health care provider how to quit. If you do not use tobacco, please do not start.  . If you choose to drink alcohol, please do not consume more than 2 drinks per day. One drink is considered to be 12 ounces (355 mL) of beer, 5 ounces (148 mL) of wine, or 1.5 ounces (44  mL) of liquor.  . If you are 81-68 years old, ask your health care provider if you should take aspirin to prevent strokes.  . Use sunscreen. Apply sunscreen liberally and repeatedly throughout the day. You should seek shade when your shadow is shorter than you. Protect yourself by wearing long sleeves, pants, a wide-brimmed hat, and sunglasses year round, whenever you are outdoors.  . Once a month, do a whole body skin exam, using a mirror to look at the skin on your back. Tell your health care provider of new moles, moles that have irregular borders, moles that are larger than a pencil eraser, or moles that have changed in shape or color.

## 2018-05-20 NOTE — Progress Notes (Signed)
Subjective:   Joseph [name] is a [age] y.o. male who presents for an Initial Medicare Annual Wellness Visit.  Review of Systems  N/A Cardiac Risk Factors include: male gender;obesity (BMI >30kg/m2);diabetes mellitus;smoking/ tobacco exposure;dyslipidemia    Objective:    Today's Vitals   05/20/18 1040 05/20/18 1050  BP:  104/76  Pulse:  81  Temp:  98.4 F (36.9 C)  TempSrc:  Oral  SpO2:  96%  Weight:  236 lb 12 oz (107.4 kg)  Height:  5' 9.25" (1.759 m)  PainSc: 3  3   PainLoc:  Knee   Body mass index is 34.71 kg/m.  Advanced Directives 05/20/2018 04/21/2018 03/24/2018 03/10/2018 11/26/2017 11/05/2017 03/20/2017  Does Patient Have a Medical Advance Directive? No No No No No No No  Would patient like information on creating a medical advance directive? No - Patient declined No - Patient declined No - Patient declined No - Patient declined No - Patient declined - No - Patient declined  Pre-existing out of facility DNR order (yellow form or pink MOST form) - - - - - - -    Current Medications (verified) Outpatient Encounter Medications as of 05/20/2018  Medication Sig  . albuterol (PROVENTIL) (2.5 MG/3ML) 0.083% nebulizer solution USE 1 VIAL VIA NEBULIZER FOUR TIMES DAILY AS NEEDED FOR SHORTNESS OF BREATH  . ANDROGEL PUMP 20.25 MG/ACT (1.62%) GEL Apply 2 Pump topically daily.   Marland Kitchen. atorvastatin (LIPITOR) 20 MG tablet Take 1 tablet (20 mg total) by mouth every evening.  . betamethasone dipropionate (DIPROLENE) 0.05 % cream Apply topically 2 (two) times daily as needed.   Marland Kitchen. buPROPion (WELLBUTRIN SR) 150 MG 12 hr tablet TAKE ONE TABLET BY MOUTH TWICE A DAY  . celecoxib (CELEBREX) 200 MG capsule Take 200 mg by mouth 2 (two) times daily.   . ciclopirox (LOPROX) 0.77 % cream Apply topically 2 (two) times daily.  Marland Kitchen. CINNAMON PO Take 2,800 mg by mouth daily. EACH TABLET IS 1400 MG.  TAKES 2 CAPSULES DAILY  . citalopram (CELEXA) 40 MG tablet TAKE ONE (1) TABLET BY MOUTH EVERY DAY  .  diphenhydrAMINE (BENADRYL) 25 MG tablet Take 25 mg by mouth 2 (two) times daily.  . empagliflozin (JARDIANCE) 10 MG TABS tablet Take 10 mg by mouth every morning.  . fluticasone furoate-vilanterol (BREO ELLIPTA) 200-25 MCG/INH AEPB Inhale 1 puff into the lungs daily.  Marland Kitchen. glipiZIDE (GLUCOTROL) 5 MG tablet TAKE TWO (2) TABLETS BY MOUTH 2 TIMES DAILY  . ipratropium (ATROVENT) 0.02 % nebulizer solution USE 1 VIAL VIA NEBULIZER FOUR TIMES DAILY AS NEEDED  . Ipratropium-Albuterol (COMBIVENT) 20-100 MCG/ACT AERS respimat Inhale 1 puff into the lungs every 6 (six) hours as needed for wheezing.  . lansoprazole (PREVACID) 30 MG capsule TAKE ONE CAPSULE BY MOUTH TWICE A DAY BEFORE A MEAL  . metFORMIN (GLUCOPHAGE-XR) 500 MG 24 hr tablet Take 2 tablets (1,000 mg total) by mouth daily with breakfast.  . Multiple Vitamin (MULTIVITAMIN WITH MINERALS) TABS Take 1 tablet by mouth every morning. Men's once daily multivitamin packet (vitamin e, calcium, ginseng)  . naloxegol oxalate (MOVANTIK) 25 MG TABS tablet Take 1 tablet (25 mg total) by mouth daily. Take on an empty stomach at least 1 hour before or 2 hours after a meal.  . Saw Palmetto, Serenoa repens, (SAW PALMETTO PO) Take 1 capsule by mouth daily.  . tamsulosin (FLOMAX) 0.4 MG CAPS capsule TAKE ONE (1) CAPSULE EACH DAY  . tapentadol (NUCYNTA) 50 MG tablet Take 1 tablet (50  mg total) by mouth every 8 (eight) hours as needed for severe pain. Do not take if you have epilepsy or a history of seizures. Swallow tablets whole. Do not chew, crush or dissolve.  . traZODone (DESYREL) 150 MG tablet Take 1 tablet (150 mg total) by mouth at bedtime as needed for sleep.   No facility-administered encounter medications on file as of 05/20/2018.     Allergies (verified) Azithromycin; Clarithromycin; Erythromycin; Keflex [cephalexin]; Metformin; and Symbicort [budesonide-formoterol fumarate]   History: Past Medical History:  Diagnosis Date  . Anxiety   . Arthritis   .  Asthma   . Cataract   . Chronic back pain   . Chronic pain of left knee   . COPD (chronic obstructive pulmonary disease) (HCC)   . Diabetes mellitus   . GERD (gastroesophageal reflux disease)   . INGUINAL PAIN, LEFT 10/03/2009   Qualifier: Diagnosis of  By: Darrick Penna MD, Sandi L   . Overdose    2019  . Shortness of breath   . Sleep apnea    Past Surgical History:  Procedure Laterality Date  . CARPAL TUNNEL RELEASE Left   . CATARACT EXTRACTION W/PHACO Right 03/11/2016   Procedure: CATARACT EXTRACTION PHACO AND INTRAOCULAR LENS PLACEMENT (IOC);  Surgeon: Gemma Payor, MD;  Location: [facility];  Service: Ophthalmology;  Laterality: Right;  CDE 4.24  . HERNIA REPAIR Bilateral   . KNEE ARTHROSCOPY WITH MEDIAL MENISECTOMY Left 04/22/2014   Procedure: LEFT KNEE ARTHROSCOPY WITH MEDIAL MENISECTOMY;  Surgeon: Vickki Hearing, MD;  Location: [facility];  Service: Orthopedics;  Laterality: Left;  . KNEE ARTHROSCOPY WITH MEDIAL MENISECTOMY Right 12/06/2015   Procedure: RIGHT KNEE ARTHROSCOPY WITH MEDIAL MENISECTOMY;  Surgeon: Vickki Hearing, MD;  Location: [facility];  Service: Orthopedics;  Laterality: Right;  . KNEE ARTHROSCOPY WITH MEDIAL MENISECTOMY Left 03/20/2017   Procedure: KNEE ARTHROSCOPY WITH MEDIAL MENISECTOMY;  Surgeon: Vickki Hearing, MD;  Location: [facility];  Service: Orthopedics;  Laterality: Left;  . ORIF WRIST FRACTURE Left 12/19/2016   Procedure: OPEN REDUCTION INTERNAL FIXATION (ORIF) WRIST FRACTURE;  Surgeon: Betha Loa, MD;  Location: [facility];  Service: Orthopedics;  Laterality: Left;  accumed   . SHOULDER ARTHROCENTESIS Right   . VASECTOMY     Family History  Problem Relation Age of Onset  . Emphysema Maternal Grandfather   . Emphysema Maternal Grandmother   . Clotting disorder Maternal Grandmother    Social History   Socioeconomic History  . Marital status: Legally Separated    Spouse name: Not on file  . Number of children: Not on file  . Years of  education: Not on file  . Highest education level: Not on file  Occupational History  . Occupation: disability  Social Needs  . Financial resource strain: Not on file  . Food insecurity:    Worry: Not on file    Inability: Not on file  . Transportation needs:    Medical: Not on file    Non-medical: Not on file  Tobacco Use  . Smoking status: Current Every Day Smoker    Packs/day: 1.00    Years: 20.00    Pack years: 20.00    Types: Cigarettes    Start date: 12/17/1993  . Smokeless tobacco: Former Neurosurgeon  . Tobacco comment: 1ppd as of 11/06/17 ep  Substance and Sexual Activity  . Alcohol use: No    Alcohol/week: 0.0 oz  . Drug use: No  . Sexual activity: Yes    Birth control/protection:  Surgical  Lifestyle  . Physical activity:    Days per week: Not on file    Minutes per session: Not on file  . Stress: Not on file  Relationships  . Social connections:    Talks on phone: Not on file    Gets together: Not on file    Attends religious service: Not on file    Active member of club or organization: Not on file    Attends meetings of clubs or organizations: Not on file    Relationship status: Not on file  Other Topics Concern  . Not on file  Social History Narrative   Recent visit to [facility] in [place] d/t increased pain where he was prescribed percocet 7.5-325 mg for pain.   Tobacco Counseling Ready to quit: No Counseling given: No Comment: 1ppd as of 11/06/17 ep   Clinical Intake:  Pre-visit preparation completed: Yes  Pain : 0-10 Pain Score: 3  Pain Type: Chronic pain Pain Location: Knee Pain Orientation: Left Pain Onset: More than a month ago Pain Frequency: Constant     Nutritional Status: BMI > 30  Obese Nutritional Risks: None Diabetes: Yes CBG done?: No Did pt. bring in CBG monitor from home?: No  How often do you need to have someone help you when you read instructions, pamphlets, or other written materials from your doctor or pharmacy?: 1 -  Never What is the last grade level you completed in school?: 12th grade  Interpreter Needed?: No  Comments: pt lives alone Information entered by :: LPinson, LPN  Activities of Daily Living In your present state of health, do you have any difficulty performing the following activities: 05/20/2018 03/24/2018  Hearing? N N  Vision? N N  Difficulty concentrating or making decisions? Y N  Walking or climbing stairs? Y N  Dressing or bathing? N N  Doing errands, shopping? N N  Preparing Food and eating ? N -  Using the Toilet? N -  In the past six months, have you accidently leaked urine? Y -  Do you have problems with loss of bowel control? N -  Managing your Medications? N -  Managing your Finances? N -  Housekeeping or managing your Housekeeping? N -  Some recent data might be hidden     Immunizations and Health Maintenance Immunization History  Administered Date(s) Administered  . Influenza Split 09/02/2011  . Influenza,inj,Quad PF,6+ Mos 12/01/2014, 08/22/2015, 09/03/2016, 10/21/2017  . Pneumococcal Polysaccharide-23 12/03/2007  . Td 10/21/2017   There are no preventive care reminders to display for this patient.  Patient Care Team: Doreene Nest, NP as PCP - General (Internal Medicine)  Indicate any recent Medical Services you may have received from other than [facility] providers in the past year (date may be approximate).    Assessment:   This is a routine wellness examination for Joseph [name].  Hearing/Vision screen  Hearing Screening   125Hz  250Hz  500Hz  1000Hz  2000Hz  3000Hz  4000Hz  6000Hz  8000Hz   Right ear:   40 40 40  40    Left ear:   40 40 40  40    Vision Screening Comments: Vision exam approx. 6 mths ago  Dietary issues and exercise activities discussed: Current Exercise Habits: The patient does not participate in regular exercise at present, Exercise limited by: orthopedic condition(s)  Goals    . Patient Stated     Starting 05/20/2018, I will continue to take  medications as prescribed.       Depression Screen PHQ 2/9 Scores  05/20/2018 05/06/2018 04/21/2018 03/10/2018  PHQ - 2 Score 2 0 0 0  PHQ- 9 Score 7 - - -    Fall Risk Fall Risk  05/20/2018 05/06/2018 04/21/2018 04/06/2018 03/10/2018  Falls in the past year? No No No No No  Risk for fall due to : History of fall(s) - - - -    Cognitive Function: MMSE - Mini Mental State Exam 05/20/2018  Orientation to time 5  Orientation to Place 5  Registration 3  Attention/ Calculation 0  Recall 0  Recall-comments unable to recall 3 of 3 words  Language- name 2 objects 0  Language- repeat 1  Language- follow 3 step command 3  Language- read & follow direction 0  Write a sentence 0  Copy design 0  Total score 17     PLEASE NOTE: A Mini-Cog screen was completed. Maximum score is 20. A value of 0 denotes this part of Folstein MMSE was not completed or the patient failed this part of the Mini-Cog screening.   Mini-Cog Screening Orientation to Time - Max 5 pts Orientation to Place - Max 5 pts Registration - Max 3 pts Recall - Max 3 pts Language Repeat - Max 1 pts Language Follow 3 Step Command - Max 3 pts     Screening Tests Health Maintenance  Topic Date Due  . PNEUMOCOCCAL POLYSACCHARIDE VACCINE (2) 06/01/2019 (Originally 12/02/2012)  . INFLUENZA VACCINE  07/02/2018  . FOOT EXAM  08/18/2018  . HEMOGLOBIN A1C  09/23/2018  . OPHTHALMOLOGY EXAM  11/03/2018  . URINE MICROALBUMIN  05/21/2019  . TETANUS/TDAP  10/22/2027  . HIV Screening  Completed      Plan:     I have personally reviewed, addressed, and noted the following in the patient's chart:  A. Medical and social history B. Use of alcohol, tobacco or illicit drugs  C. Current medications and supplements D. Functional ability and status E.  Nutritional status F.  Physical activity G. Advance directives H. List of other physicians I.  Hospitalizations, surgeries, and ER visits in previous 12 months J.  Vitals K. Screenings to include  hearing, vision, cognitive, depression L. Referrals and appointments - none  In addition, I have reviewed and discussed with patient certain preventive protocols, quality metrics, and best practice recommendations. A written personalized care plan for preventive services as well as general preventive health recommendations were provided to patient.  See attached scanned questionnaire for additional information.   Signed,   Randa Evens, MHA, BS, LPN Health Coach

## 2018-05-21 ENCOUNTER — Ambulatory Visit (INDEPENDENT_AMBULATORY_CARE_PROVIDER_SITE_OTHER): Payer: Medicare Other | Admitting: Primary Care

## 2018-05-21 ENCOUNTER — Encounter: Payer: Self-pay | Admitting: Primary Care

## 2018-05-21 VITALS — BP 122/74 | HR 82 | Temp 98.2°F | Ht 69.25 in | Wt 241.0 lb

## 2018-05-21 DIAGNOSIS — E119 Type 2 diabetes mellitus without complications: Secondary | ICD-10-CM

## 2018-05-21 DIAGNOSIS — J209 Acute bronchitis, unspecified: Secondary | ICD-10-CM | POA: Diagnosis not present

## 2018-05-21 LAB — MICROALBUMIN / CREATININE URINE RATIO
CREATININE, U: 114.4 mg/dL
MICROALB/CREAT RATIO: 0.6 mg/g (ref 0.0–30.0)
Microalb, Ur: <0.7 mg/dL (ref 0.0–1.9)

## 2018-05-21 MED ORDER — AMOXICILLIN-POT CLAVULANATE 875-125 MG PO TABS: 1.0000 | ORAL_TABLET | Freq: Two times a day (BID) | ORAL | 0 refills | Status: DC

## 2018-05-21 NOTE — Progress Notes (Signed)
I reviewed health advisor's note, was available for consultation, and agree with documentation and plan.  

## 2018-05-21 NOTE — Progress Notes (Signed)
Subjective:    Patient ID: Joseph [name], male    DOB: February 17, 1971, 47 y.o.   MRN: [id number]  HPI  Joseph [name] is a 47 year old male who presents today with a chief complaint of fatigue.  He also reports cough, chest congestion, nasal congestion. He feels that he has no energy. Symptoms have been present for the past 10 days, worse over the last 3 days. He's been exposed to cough/cold symptoms by family members who were treated with antibiotics.  He denies fevers. He's taking his prescription medication and nothing OTC for symptoms/cough. Overall he's feeling worse.  Review of Systems  Constitutional: Positive for fatigue.  HENT: Positive for congestion. Negative for sinus pressure and sore throat.   Respiratory: Positive for cough. Negative for shortness of breath and wheezing.        Past Medical History:  Diagnosis Date  . Anxiety   . Arthritis   . Asthma   . Cataract   . Chronic back pain   . Chronic pain of left knee   . COPD (chronic obstructive pulmonary disease) (HCC)   . Diabetes mellitus   . GERD (gastroesophageal reflux disease)   . INGUINAL PAIN, LEFT 10/03/2009   Qualifier: Diagnosis of  By: Darrick Penna MD, Sandi L   . Overdose    2019  . Shortness of breath   . Sleep apnea      Social History   Socioeconomic History  . Marital status: Legally Separated    Spouse name: Not on file  . Number of children: Not on file  . Years of education: Not on file  . Highest education level: Not on file  Occupational History  . Occupation: disability  Social Needs  . Financial resource strain: Not on file  . Food insecurity:    Worry: Not on file    Inability: Not on file  . Transportation needs:    Medical: Not on file    Non-medical: Not on file  Tobacco Use  . Smoking status: Current Every Day Smoker    Packs/day: 1.00    Years: 20.00    Pack years: 20.00    Types: Cigarettes    Start date: 12/17/1993  . Smokeless tobacco: Former Neurosurgeon  . Tobacco comment: 1ppd  as of 11/06/17 ep  Substance and Sexual Activity  . Alcohol use: No    Alcohol/week: 0.0 oz  . Drug use: No  . Sexual activity: Yes    Birth control/protection: Surgical  Lifestyle  . Physical activity:    Days per week: Not on file    Minutes per session: Not on file  . Stress: Not on file  Relationships  . Social connections:    Talks on phone: Not on file    Gets together: Not on file    Attends religious service: Not on file    Active member of club or organization: Not on file    Attends meetings of clubs or organizations: Not on file    Relationship status: Not on file  . Intimate partner violence:    Fear of current or ex partner: Not on file    Emotionally abused: Not on file    Physically abused: Not on file    Forced sexual activity: Not on file  Other Topics Concern  . Not on file  Social History Narrative   Recent visit to [facility] in [place] d/t increased pain where he was prescribed percocet 7.5-325 mg for pain.    Past  Surgical History:  Procedure Laterality Date  . CARPAL TUNNEL RELEASE Left   . CATARACT EXTRACTION W/PHACO Right 03/11/2016   Procedure: CATARACT EXTRACTION PHACO AND INTRAOCULAR LENS PLACEMENT (IOC);  Surgeon: Gemma PayorKerry Hunt, MD;  Location: [facility];  Service: Ophthalmology;  Laterality: Right;  CDE 4.24  . HERNIA REPAIR Bilateral   . KNEE ARTHROSCOPY WITH MEDIAL MENISECTOMY Left 04/22/2014   Procedure: LEFT KNEE ARTHROSCOPY WITH MEDIAL MENISECTOMY;  Surgeon: Vickki HearingStanley E Harrison, MD;  Location: [facility];  Service: Orthopedics;  Laterality: Left;  . KNEE ARTHROSCOPY WITH MEDIAL MENISECTOMY Right 12/06/2015   Procedure: RIGHT KNEE ARTHROSCOPY WITH MEDIAL MENISECTOMY;  Surgeon: Vickki HearingStanley E Harrison, MD;  Location: [facility];  Service: Orthopedics;  Laterality: Right;  . KNEE ARTHROSCOPY WITH MEDIAL MENISECTOMY Left 03/20/2017   Procedure: KNEE ARTHROSCOPY WITH MEDIAL MENISECTOMY;  Surgeon: Vickki HearingStanley E Harrison, MD;  Location: [facility];  Service: Orthopedics;  Laterality:  Left;  . ORIF WRIST FRACTURE Left 12/19/2016   Procedure: OPEN REDUCTION INTERNAL FIXATION (ORIF) WRIST FRACTURE;  Surgeon: Betha LoaKevin Kuzma, MD;  Location: [facility];  Service: Orthopedics;  Laterality: Left;  accumed   . SHOULDER ARTHROCENTESIS Right   . VASECTOMY      Family History  Problem Relation Age of Onset  . Emphysema Maternal Grandfather   . Emphysema Maternal Grandmother   . Clotting disorder Maternal Grandmother     Allergies  Allergen Reactions  . Azithromycin Hives  . Clarithromycin   . Erythromycin Hives  . Keflex [Cephalexin] Nausea Only  . Metformin Nausea And Vomiting  . Symbicort [Budesonide-Formoterol Fumarate] Other (See Comments)    States that 3 doses were used and breathing became worse    Current Outpatient Medications on File Prior to Visit  Medication Sig Dispense Refill  . albuterol (PROVENTIL) (2.5 MG/3ML) 0.083% nebulizer solution USE 1 VIAL VIA NEBULIZER FOUR TIMES DAILY AS NEEDED FOR SHORTNESS OF BREATH 360 mL 0  . ANDROGEL PUMP 20.25 MG/ACT (1.62%) GEL Apply 2 Pump topically daily.     Marland Kitchen. atorvastatin (LIPITOR) 20 MG tablet Take 1 tablet (20 mg total) by mouth every evening. 90 tablet 3  . betamethasone dipropionate (DIPROLENE) 0.05 % cream Apply topically 2 (two) times daily as needed.     Marland Kitchen. buPROPion (WELLBUTRIN SR) 150 MG 12 hr tablet TAKE ONE TABLET BY MOUTH TWICE A DAY 180 tablet 1  . celecoxib (CELEBREX) 200 MG capsule Take 200 mg by mouth 2 (two) times daily.     . ciclopirox (LOPROX) 0.77 % cream Apply topically 2 (two) times daily. 30 g 0  . CINNAMON PO Take 2,800 mg by mouth daily. EACH TABLET IS 1400 MG.  TAKES 2 CAPSULES DAILY    . citalopram (CELEXA) 40 MG tablet TAKE ONE (1) TABLET BY MOUTH EVERY DAY 30 tablet 11  . diphenhydrAMINE (BENADRYL) 25 MG tablet Take 25 mg by mouth 2 (two) times daily.    . empagliflozin (JARDIANCE) 10 MG TABS tablet Take 10 mg by mouth every morning. 90 tablet 2  . fluticasone  furoate-vilanterol (BREO ELLIPTA) 200-25 MCG/INH AEPB Inhale 1 puff into the lungs daily. 30 each 11  . glipiZIDE (GLUCOTROL) 5 MG tablet TAKE TWO (2) TABLETS BY MOUTH 2 TIMES DAILY 360 tablet 1  . ipratropium (ATROVENT) 0.02 % nebulizer solution USE 1 VIAL VIA NEBULIZER FOUR TIMES DAILY AS NEEDED 300 mL 0  . Ipratropium-Albuterol (COMBIVENT) 20-100 MCG/ACT AERS respimat Inhale 1 puff into the lungs every 6 (six) hours as needed for wheezing. 1 Inhaler  2  . lansoprazole (PREVACID) 30 MG capsule TAKE ONE CAPSULE BY MOUTH TWICE A DAY BEFORE A MEAL 180 capsule 1  . metFORMIN (GLUCOPHAGE-XR) 500 MG 24 hr tablet Take 2 tablets (1,000 mg total) by mouth daily with breakfast.    . Multiple Vitamin (MULTIVITAMIN WITH MINERALS) TABS Take 1 tablet by mouth every morning. Men's once daily multivitamin packet (vitamin e, calcium, ginseng)    . naloxegol oxalate (MOVANTIK) 25 MG TABS tablet Take 1 tablet (25 mg total) by mouth daily. Take on an empty stomach at least 1 hour before or 2 hours after a meal. 30 tablet 0  . Saw Palmetto, Serenoa repens, (SAW PALMETTO PO) Take 1 capsule by mouth daily.    . tamsulosin (FLOMAX) 0.4 MG CAPS capsule TAKE ONE (1) CAPSULE EACH DAY 90 capsule 1  . tapentadol (NUCYNTA) 50 MG tablet Take 1 tablet (50 mg total) by mouth every 8 (eight) hours as needed for severe pain. Do not take if you have epilepsy or a history of seizures. Swallow tablets whole. Do not chew, crush or dissolve. 90 tablet 0  . traZODone (DESYREL) 150 MG tablet Take 1 tablet (150 mg total) by mouth at bedtime as needed for sleep. 90 tablet 0   No current facility-administered medications on file prior to visit.     BP 122/74   Pulse 82   Temp 98.2 F (36.8 C) (Oral)   Ht 5' 9.25" (1.759 m)   Wt 241 lb (109.3 kg)   SpO2 97%   BMI 35.33 kg/m    Objective:   Physical Exam  Constitutional: He appears well-nourished. He appears ill.  HENT:  Right Ear: Tympanic membrane and ear canal normal.  Left  Ear: Tympanic membrane and ear canal normal.  Nose: No mucosal edema. Right sinus exhibits no maxillary sinus tenderness and no frontal sinus tenderness. Left sinus exhibits no maxillary sinus tenderness and no frontal sinus tenderness.  Mouth/Throat: No posterior oropharyngeal erythema.  Dry oropharynx   Neck: Neck supple.  Cardiovascular: Normal rate and regular rhythm.  Respiratory: Effort normal and breath sounds normal. He has no wheezes.  Dry cough during exam  Skin: Skin is warm and dry.           Assessment & Plan:  Acute Bronchitis:  Cough, congestion, fatigue x 10 days, feeling worse. Exam today with overall clear lungs but persistent cough. He does appear unwell which is different than usual. Given duration of symptoms coupled with presentation, will treat. Rx for Augmentin course sent to pharmacy.  Fluids, rest, follow up PRN.  Doreene Nest, NP

## 2018-05-21 NOTE — Patient Instructions (Signed)
Start Augmentin antibiotics for the infection Take 1 tablet by mouth twice daily for 10 days.  Make sure to drink plenty of water and rest.  It was a pleasure to see you today!

## 2018-05-26 ENCOUNTER — Ambulatory Visit: Payer: Medicare Other | Admitting: Primary Care

## 2018-05-26 DIAGNOSIS — Z0289 Encounter for other administrative examinations: Secondary | ICD-10-CM

## 2018-05-27 DIAGNOSIS — M25462 Effusion, left knee: Secondary | ICD-10-CM | POA: Diagnosis not present

## 2018-05-27 DIAGNOSIS — M25562 Pain in left knee: Secondary | ICD-10-CM | POA: Diagnosis not present

## 2018-05-28 ENCOUNTER — Other Ambulatory Visit: Payer: Self-pay | Admitting: Primary Care

## 2018-05-28 NOTE — Telephone Encounter (Signed)
Ok to refill? Last prescribed on 04/10/2017 #30 with 11 refills  Last seen on 05/21/2018

## 2018-06-01 ENCOUNTER — Other Ambulatory Visit: Payer: Self-pay

## 2018-06-01 ENCOUNTER — Encounter: Payer: Self-pay | Admitting: Nurse Practitioner

## 2018-06-01 ENCOUNTER — Ambulatory Visit: Payer: Medicare Other | Attending: Nurse Practitioner | Admitting: Nurse Practitioner

## 2018-06-01 VITALS — BP 118/78 | HR 94 | Temp 98.2°F | Ht 70.0 in | Wt 241.0 lb

## 2018-06-01 DIAGNOSIS — E119 Type 2 diabetes mellitus without complications: Secondary | ICD-10-CM | POA: Diagnosis not present

## 2018-06-01 DIAGNOSIS — G4733 Obstructive sleep apnea (adult) (pediatric): Secondary | ICD-10-CM | POA: Insufficient documentation

## 2018-06-01 DIAGNOSIS — F17210 Nicotine dependence, cigarettes, uncomplicated: Secondary | ICD-10-CM | POA: Diagnosis not present

## 2018-06-01 DIAGNOSIS — K5903 Drug induced constipation: Secondary | ICD-10-CM

## 2018-06-01 DIAGNOSIS — Z5181 Encounter for therapeutic drug level monitoring: Secondary | ICD-10-CM | POA: Insufficient documentation

## 2018-06-01 DIAGNOSIS — Z79899 Other long term (current) drug therapy: Secondary | ICD-10-CM | POA: Insufficient documentation

## 2018-06-01 DIAGNOSIS — F4310 Post-traumatic stress disorder, unspecified: Secondary | ICD-10-CM | POA: Insufficient documentation

## 2018-06-01 DIAGNOSIS — Z9841 Cataract extraction status, right eye: Secondary | ICD-10-CM | POA: Diagnosis not present

## 2018-06-01 DIAGNOSIS — M25532 Pain in left wrist: Secondary | ICD-10-CM | POA: Insufficient documentation

## 2018-06-01 DIAGNOSIS — J449 Chronic obstructive pulmonary disease, unspecified: Secondary | ICD-10-CM | POA: Diagnosis not present

## 2018-06-01 DIAGNOSIS — Z9889 Other specified postprocedural states: Secondary | ICD-10-CM | POA: Insufficient documentation

## 2018-06-01 DIAGNOSIS — M25561 Pain in right knee: Secondary | ICD-10-CM

## 2018-06-01 DIAGNOSIS — Z7984 Long term (current) use of oral hypoglycemic drugs: Secondary | ICD-10-CM | POA: Diagnosis not present

## 2018-06-01 DIAGNOSIS — G894 Chronic pain syndrome: Secondary | ICD-10-CM | POA: Diagnosis not present

## 2018-06-01 DIAGNOSIS — Z881 Allergy status to other antibiotic agents status: Secondary | ICD-10-CM | POA: Insufficient documentation

## 2018-06-01 DIAGNOSIS — E785 Hyperlipidemia, unspecified: Secondary | ICD-10-CM | POA: Insufficient documentation

## 2018-06-01 DIAGNOSIS — T402X5A Adverse effect of other opioids, initial encounter: Secondary | ICD-10-CM

## 2018-06-01 DIAGNOSIS — M1712 Unilateral primary osteoarthritis, left knee: Secondary | ICD-10-CM | POA: Diagnosis not present

## 2018-06-01 DIAGNOSIS — K219 Gastro-esophageal reflux disease without esophagitis: Secondary | ICD-10-CM | POA: Insufficient documentation

## 2018-06-01 DIAGNOSIS — F329 Major depressive disorder, single episode, unspecified: Secondary | ICD-10-CM | POA: Insufficient documentation

## 2018-06-01 DIAGNOSIS — M25562 Pain in left knee: Secondary | ICD-10-CM | POA: Diagnosis not present

## 2018-06-01 DIAGNOSIS — G8929 Other chronic pain: Secondary | ICD-10-CM

## 2018-06-01 MED ORDER — TAPENTADOL HCL 50 MG PO TABS: 50.0000 mg | ORAL_TABLET | Freq: Three times a day (TID) | ORAL | 0 refills | Status: DC | PRN

## 2018-06-01 MED ORDER — NALOXEGOL OXALATE 25 MG PO TABS: 25.0000 mg | ORAL_TABLET | Freq: Every day | ORAL | 0 refills | Status: AC

## 2018-06-01 NOTE — Patient Instructions (Addendum)
____________________________________________________________________________________________  Medication Rules  Applies to: All patients receiving prescriptions (written or electronic).  Pharmacy of record: Pharmacy where electronic prescriptions will be sent. If written prescriptions are taken to a different pharmacy, please inform the nursing staff. The pharmacy listed in the electronic medical record should be the one where you would like electronic prescriptions to be sent.  Prescription refills: Only during scheduled appointments. Applies to both, written and electronic prescriptions.  NOTE: The following applies primarily to controlled substances (Opioid* Pain Medications).   Patient's responsibilities: 1. Pain Pills: Bring all pain pills to every appointment (except for procedure appointments). 2. Pill Bottles: Bring pills in original pharmacy bottle. Always bring newest bottle. Bring bottle, even if empty. 3. Medication refills: You are responsible for knowing and keeping track of what medications you need refilled. The day before your appointment, write a list of all prescriptions that need to be refilled. Bring that list to your appointment and give it to the admitting nurse. Prescriptions will be written only during appointments. If you forget a medication, it will not be "Called in", "Faxed", or "electronically sent". You will need to get another appointment to get these prescribed. 4. Prescription Accuracy: You are responsible for carefully inspecting your prescriptions before leaving our office. Have the discharge nurse carefully go over each prescription with you, before taking them home. Make sure that your name is accurately spelled, that your address is correct. Check the name and dose of your medication to make sure it is accurate. Check the number of pills, and the written instructions to make sure they are clear and accurate. Make sure that you are given enough medication to last  until your next medication refill appointment. 5. Taking Medication: Take medication as prescribed. Never take more pills than instructed. Never take medication more frequently than prescribed. Taking less pills or less frequently is permitted and encouraged, when it comes to controlled substances (written prescriptions).  6. Inform other Doctors: Always inform, all of your healthcare providers, of all the medications you take. 7. Pain Medication from other Providers: You are not allowed to accept any additional pain medication from any other Doctor or Healthcare provider. There are two exceptions to this rule. (see below) In the event that you require additional pain medication, you are responsible for notifying us, as stated below. 8. Medication Agreement: You are responsible for carefully reading and following our Medication Agreement. This must be signed before receiving any prescriptions from our practice. Safely store a copy of your signed Agreement. Violations to the Agreement will result in no further prescriptions. (Additional copies of our Medication Agreement are available upon request.) 9. Laws, Rules, & Regulations: All patients are expected to follow all Federal and State Laws, Statutes, Rules, & Regulations. Ignorance of the Laws does not constitute a valid excuse. The use of any illegal substances is prohibited. 10. Adopted CDC guidelines & recommendations: Target dosing levels will be at or below 60 MME/day. Use of benzodiazepines** is not recommended.  Exceptions: There are only two exceptions to the rule of not receiving pain medications from other Healthcare Providers. 1. Exception #1 (Emergencies): In the event of an emergency (i.e.: accident requiring emergency care), you are allowed to receive additional pain medication. However, you are responsible for: As soon as you are able, call our office [phone], at any time of the day or night, and leave a message stating your name, the  date and nature of the emergency, and the name and dose of the medication   prescribed. In the event that your call is answered by a member of our staff, make sure to document and save the date, time, and the name of the person that took your information.  2. Exception #2 (Planned Surgery): In the event that you are scheduled by another doctor or dentist to have any type of surgery or procedure, you are allowed (for a period no longer than 30 days), to receive additional pain medication, for the acute post-op pain. However, in this case, you are responsible for picking up a copy of our "Post-op Pain Management for Surgeons" handout, and giving it to your surgeon or dentist. This document is available at our office, and does not require an appointment to obtain it. Simply go to our office during business hours (Monday-Thursday from 8:00 AM to 4:00 PM) (Friday 8:00 AM to 12:00 Noon) or if you have a scheduled appointment with us, prior to your surgery, and ask for it by name. In addition, you will need to provide us with your name, name of your surgeon, type of surgery, and date of procedure or surgery.  *Opioid medications include: morphine, codeine, oxycodone, oxymorphone, hydrocodone, hydromorphone, meperidine, tramadol, tapentadol, buprenorphine, fentanyl, methadone. **Benzodiazepine medications include: diazepam (Valium), alprazolam (Xanax), clonazepam (Klonopine), lorazepam (Ativan), clorazepate (Tranxene), chlordiazepoxide (Librium), estazolam (Prosom), oxazepam (Serax), temazepam (Restoril), triazolam (Halcion) (Last updated: 01/29/2018) ____________________________________________________________________________________________    BMI Assessment: Estimated body mass index is 34.58 kg/m as calculated from the following:   Height as of this encounter: 5\' 10"  (1.778 m).   Weight as of this encounter: 241 lb (109.3 kg).  BMI interpretation table: BMI level Category Range association with higher  incidence of chronic pain  <18 kg/m2 Underweight   18.5-24.9 kg/m2 Ideal body weight   25-29.9 kg/m2 Overweight Increased incidence by 20%  30-34.9 kg/m2 Obese (Class I) Increased incidence by 68%  35-39.9 kg/m2 Severe obesity (Class II) Increased incidence by 136%  >40 kg/m2 Extreme obesity (Class III) Increased incidence by 254%   Patient's current BMI Ideal Body weight  Body mass index is 34.58 kg/m. Ideal body weight: 73 kg (160 lb 15 oz) Adjusted ideal body weight: 87.5 kg (192 lb 15.4 oz)   BMI Readings from Last 4 Encounters:  06/01/18 34.58 kg/m  05/21/18 35.33 kg/m  05/20/18 34.71 kg/m  05/06/18 34.58 kg/m   Wt Readings from Last 4 Encounters:  06/01/18 241 lb (109.3 kg)  05/21/18 241 lb (109.3 kg)  05/20/18 236 lb 12 oz (107.4 kg)  05/06/18 241 lb (109.3 kg)

## 2018-06-01 NOTE — Progress Notes (Signed)
Patient's Name: Joseph [name]  MRN: [id number]  Referring Provider: Pleas Koch, NP  DOB: [birth year]  PCP: Pleas Koch, NP  DOS: 06/01/2018  Note by: Vevelyn Francois NP  Service setting: Ambulatory outpatient  Specialty: Interventional Pain Management  Location: [facility] (AMB) Pain Management Facility    Patient type: Established    Primary Reason(s) for Visit: Encounter for prescription drug management. (Level of risk: moderate)  CC: Knee Pain (left)  HPI  Mr. Whetsel is a 47 y.o. year old, male patient, who comes today for a medication management evaluation. He has Mediastinal abnormality; Cigarette smoker; Chronic pain syndrome; Diabetes ([place]); Intrinsic asthma; Medial meniscus, posterior horn derangement; S/P knee surgery; Meniscal tear of knee, sequela (Left); Testosterone deficiency; OSA (obstructive sleep apnea); Morbid obesity ([place]); Anxiety and depression; Bucket handle tear of meniscus of knee (Right); Skin lesions; GERD (gastroesophageal reflux disease); Osteoarthritis of the knee (Left); Post-void dribbling; COPD without exacerbation ([place]); Hyperlipidemia; Chronic constipation; Disorder of skeletal system; Chronic knee pain (Primary Area of Pain) (Bilateral) (L>R); Chronic wrist pain (Secondary Area of Pain) (Left); Insomnia; Male hypogonadism; Pharmacologic therapy; Problems influencing health status; Long term prescription opiate use; Opiate use; Tinea corporis; Tinea versicolor; Arthropathy of knee (Left); Opioid-induced constipation (OIC); Opiate overdose ([place]); Acute encephalopathy; Encephalopathy acute; Acute postoperative pain; Effusion of knee joint (Left); and Medicare annual wellness visit, initial on their problem list. His primarily concern today is the Knee Pain (left)  Pain Assessment: Location: Left Knee Radiating: Denies Onset: More than a month ago Duration: Chronic pain Quality: Aching Severity: 1 /10 (subjective, self-reported pain score)  Note: Reported level  is compatible with observation.                          Effect on ADL: prolonged walking Timing: Constant Modifying factors: meds and rest BP: 462/86  HR: 94  Mr. Hildreth was last scheduled for an appointment on 05/06/2018 for medication management. During today's appointment we reviewed Mr. Trigueros chronic pain status, as well as his outpatient medication regimen.He admits that in August he will have a partial left knee replacement by Dr Percell Miller. He admits that he is having recurrent fluid build up every 3 weeks. He admits that he has had times when he stands that his knee feels like it does not want to support him. He denies any new concerns or complaints. He admits that he is excited about having the surgery.   The patient  reports that he does not use drugs. His body mass index is 34.58 kg/m.  Further details on both, my assessment(s), as well as the proposed treatment plan, please see below.  Controlled Substance Pharmacotherapy Assessment REMS (Risk Evaluation and Mitigation Strategy)  Analgesic:Nucynta 50 mg 3 times daily(150 mg/dayof Nucynta) MME/day:12m/day    BChauncey Fischer RN  06/01/2018  9:02 AM  Sign at close encounter Nursing Pain Medication Assessment:  Safety precautions to be maintained throughout the outpatient stay will include: orient to surroundings, keep bed in low position, maintain call bell within reach at all times, provide assistance with transfer out of bed and ambulation.  Medication Inspection Compliance: Pill count conducted under aseptic conditions, in front of the patient. Neither the pills nor the bottle was removed from the patient's sight at any time. Once count was completed pills were immediately returned to the patient in their original bottle.  Medication: Tapentadol (Nucynta) Pill/Patch Count: 55 of 90 pills remain Pill/Patch Appearance: Markings consistent with prescribed  medication Bottle Appearance: Standard pharmacy container. Clearly  labeled. Filled Date: 6 / 67 / 2019 Last Medication intake:  Yesterday   Pharmacokinetics: Liberation and absorption (onset of action): WNL Distribution (time to peak effect): WNL Metabolism and excretion (duration of action): WNL         Pharmacodynamics: Desired effects: Analgesia: Mr. Niehoff reports >16% benefit. Functional ability: Patient reports that medication allows him to accomplish basic ADLs Clinically meaningful improvement in function (CMIF): Sustained CMIF goals met Perceived effectiveness: Described as relatively effective, allowing for increase in activities of daily living (ADL) Undesirable effects: Side-effects or Adverse reactions: None reported Monitoring: [place] PMP: Online review of the past 74-monthperiod conducted. Compliant with practice rules and regulations Last UDS on record: Summary  Date Value Ref Range Status  05/06/2018 FINAL  Final    Comment:    ==================================================================== TOXASSURE SELECT 13 (MW) ==================================================================== Test                             Result       Flag       Units Drug Present and Declared for Prescription Verification   Tapentadol                     >9259        EXPECTED   ng/mg creat    Source of tapentadol is a scheduled prescription medication. Drug Present not Declared for Prescription Verification   Alcohol, Ethyl                 >0.400       UNEXPECTED g/dL    Sources of ethyl alcohol include alcoholic beverages or as a    fermentation product of glucose; glucose is present in this    specimen.  The high concentration of ethyl alcohol and the    presence of glucose supports fermentation as the source of ethyl    alcohol in this specimen. ==================================================================== Test                      Result    Flag   Units      Ref Range   Creatinine              108              mg/dL       >=20 ==================================================================== Declared Medications:  The flagging and interpretation on this report are based on the  following declared medications.  Unexpected results may arise from  inaccuracies in the declared medications.  **Note: The testing scope of this panel includes these medications:  Tapentadol  **Note: The testing scope of this panel does not include following  reported medications:  Albuterol  Albuterol (Ipratropium-Albuterol)  Atorvastatin  Betamethasone  Bupropion  Celecoxib  Ciclopirox  Cinnamon Bark  Citalopram  Diphenhydramine  Empagliflozin  Fluticasone (Breo)  Glipizide  Ipratropium  Ipratropium (Ipratropium-Albuterol)  Lansoprazole  Metformin  Multivitamin (MVI)  Naloxegol  Supplement (Saw Palmetto)  Tamsulosin  Testosterone  Trazodone  Vilanterol (Breo) ==================================================================== For clinical consultation, please call [phone] ====================================================================    UDS interpretation: Compliant          Medication Assessment Form: Reviewed. Patient indicates being compliant with therapy Treatment compliance: Compliant Risk Assessment Profile: Aberrant behavior: See prior evaluations. None observed or detected today Comorbid factors increasing risk of overdose: See prior notes. No additional risks detected today Risk  of substance use disorder (SUD): Low Opioid Risk Tool - 05/06/18 0834      Personal History of Substance Abuse   Alcohol  Negative    Illegal Drugs  Negative    Rx Drugs  Negative      Age   Age between 4-45 years   No      History of Preadolescent Sexual Abuse   History of Preadolescent Sexual Abuse  Negative or Male positive male   positive male     Psychological Disease   Psychological Disease  Positive    ADD  Negative    OCD  Negative    Bipolar  Negative    Schizophrenia  Negative     Depression  Positive Ptsd   Ptsd     Total Score   Opioid Risk Tool Scoring  3    Opioid Risk Interpretation  Low Risk      ORT Scoring interpretation table:  Score <3 = Low Risk for SUD  Score between 4-7 = Moderate Risk for SUD  Score >8 = High Risk for Opioid Abuse   Risk Mitigation Strategies:  Patient Counseling: Covered Patient-Prescriber Agreement (PPA): Present and active  Notification to other healthcare providers: Done  Pharmacologic Plan: No change in therapy, at this time.             Laboratory Chemistry  Inflammation Markers (CRP: Acute Phase) (ESR: Chronic Phase) Lab Results  Component Value Date   CRP 0.7 11/05/2017   ESRSEDRATE 11 11/05/2017   LATICACIDVEN 2.1 01/24/2013                         Rheumatology Markers No results found for: RF, ANA, LABURIC, URICUR, LYMEIGGIGMAB, LYMEABIGMQN, HLAB27                      Renal Function Markers Lab Results  Component Value Date   BUN 7 03/27/2018   CREATININE 0.88 03/27/2018   BCR 10 11/05/2017   GFRAA >60 03/25/2018   GFRNONAA >60 03/25/2018                             Hepatic Function Markers Lab Results  Component Value Date   AST 11 (L) 03/25/2018   ALT 13 (L) 03/25/2018   ALBUMIN 3.6 03/25/2018   ALKPHOS 67 03/25/2018   LIPASE 38 01/24/2013                        Electrolytes Lab Results  Component Value Date   NA 140 03/27/2018   K 4.0 03/27/2018   CL 102 03/27/2018   CALCIUM 9.1 03/27/2018   MG 2.1 11/05/2017                        Neuropathy Markers Lab Results  Component Value Date   VITAMINB12 341 11/05/2017   HGBA1C 6.7 (H) 03/24/2018   HIV Non Reactive 03/24/2018                        Bone Pathology Markers Lab Results  Component Value Date   25OHVITD1 42 11/05/2017   25OHVITD2 <1.0 11/05/2017   25OHVITD3 42 11/05/2017   TESTOSTERONE 208.98 (L) 04/17/2017  Coagulation Parameters Lab Results  Component Value Date   PLT 173 03/25/2018    DDIMER 0.46 03/07/2012                        Cardiovascular Markers Lab Results  Component Value Date   TROPONINI <0.30 12/09/2013   HGB 13.8 03/25/2018   HCT 42.3 03/25/2018                         CA Markers No results found for: CEA, CA125, LABCA2                      Note: Lab results reviewed.  Recent Diagnostic Imaging Results  DG C-Arm 1-60 Min-No Report Fluoroscopy was utilized by the requesting physician.  No radiographic  interpretation.   Complexity Note: Imaging results reviewed. Results shared with Mr. Holzmann, using Layman's terms.                         Meds   Current Outpatient Medications:  .  albuterol (PROVENTIL) (2.5 MG/3ML) 0.083% nebulizer solution, USE 1 VIAL VIA NEBULIZER FOUR TIMES DAILY AS NEEDED FOR SHORTNESS OF BREATH, Disp: 360 mL, Rfl: 0 .  amoxicillin-clavulanate (AUGMENTIN) 875-125 MG tablet, Take 1 tablet by mouth 2 (two) times daily., Disp: 20 tablet, Rfl: 0 .  ANDROGEL PUMP 20.25 MG/ACT (1.62%) GEL, Apply 2 Pump topically daily. , Disp: , Rfl:  .  atorvastatin (LIPITOR) 20 MG tablet, Take 1 tablet (20 mg total) by mouth every evening., Disp: 90 tablet, Rfl: 3 .  betamethasone dipropionate (DIPROLENE) 0.05 % cream, Apply topically 2 (two) times daily as needed. , Disp: , Rfl:  .  buPROPion (WELLBUTRIN SR) 150 MG 12 hr tablet, TAKE ONE TABLET BY MOUTH TWICE A DAY, Disp: 180 tablet, Rfl: 1 .  celecoxib (CELEBREX) 200 MG capsule, Take 200 mg by mouth 2 (two) times daily. , Disp: , Rfl:  .  ciclopirox (LOPROX) 0.77 % cream, Apply topically 2 (two) times daily., Disp: 30 g, Rfl: 0 .  CINNAMON PO, Take 2,800 mg by mouth daily. EACH TABLET IS 1400 MG.  TAKES 2 CAPSULES DAILY, Disp: , Rfl:  .  citalopram (CELEXA) 40 MG tablet, TAKE ONE TABLET BY MOUTH ONCE DAILY, Disp: 30 tablet, Rfl: 11 .  diphenhydrAMINE (BENADRYL) 25 MG tablet, Take 25 mg by mouth 2 (two) times daily., Disp: , Rfl:  .  empagliflozin (JARDIANCE) 10 MG TABS tablet, Take 10 mg by mouth  every morning., Disp: 90 tablet, Rfl: 2 .  fluticasone furoate-vilanterol (BREO ELLIPTA) 200-25 MCG/INH AEPB, Inhale 1 puff into the lungs daily., Disp: 30 each, Rfl: 11 .  glipiZIDE (GLUCOTROL) 5 MG tablet, TAKE TWO (2) TABLETS BY MOUTH 2 TIMES DAILY, Disp: 360 tablet, Rfl: 1 .  ipratropium (ATROVENT) 0.02 % nebulizer solution, USE 1 VIAL VIA NEBULIZER FOUR TIMES DAILY AS NEEDED, Disp: 300 mL, Rfl: 0 .  Ipratropium-Albuterol (COMBIVENT) 20-100 MCG/ACT AERS respimat, Inhale 1 puff into the lungs every 6 (six) hours as needed for wheezing., Disp: 1 Inhaler, Rfl: 2 .  lansoprazole (PREVACID) 30 MG capsule, TAKE ONE CAPSULE BY MOUTH TWICE A DAY BEFORE A MEAL, Disp: 180 capsule, Rfl: 1 .  metFORMIN (GLUCOPHAGE-XR) 500 MG 24 hr tablet, Take 2 tablets (1,000 mg total) by mouth daily with breakfast., Disp: , Rfl:  .  Multiple Vitamin (MULTIVITAMIN WITH MINERALS) TABS, Take 1 tablet by mouth  every morning. Men's once daily multivitamin packet (vitamin e, calcium, ginseng), Disp: , Rfl:  .  naloxegol oxalate (MOVANTIK) 25 MG TABS tablet, Take 1 tablet (25 mg total) by mouth daily. Take on an empty stomach at least 1 hour before or 2 hours after a meal., Disp: 30 tablet, Rfl: 0 .  Saw Palmetto, Serenoa repens, (SAW PALMETTO PO), Take 1 capsule by mouth daily., Disp: , Rfl:  .  tamsulosin (FLOMAX) 0.4 MG CAPS capsule, TAKE ONE (1) CAPSULE EACH DAY, Disp: 90 capsule, Rfl: 1 .  [START ON 06/17/2018] tapentadol (NUCYNTA) 50 MG tablet, Take 1 tablet (50 mg total) by mouth every 8 (eight) hours as needed for severe pain. Do not take if you have epilepsy or a history of seizures. Swallow tablets whole. Do not chew, crush or dissolve., Disp: 90 tablet, Rfl: 0 .  traZODone (DESYREL) 150 MG tablet, Take 1 tablet (150 mg total) by mouth at bedtime as needed for sleep., Disp: 90 tablet, Rfl: 0  ROS  Constitutional: Denies any fever or chills Gastrointestinal: No reported hemesis, hematochezia, vomiting, or acute GI  distress Musculoskeletal: Denies any acute onset joint swelling, redness, loss of ROM, or weakness Neurological: No reported episodes of acute onset apraxia, aphasia, dysarthria, agnosia, amnesia, paralysis, loss of coordination, or loss of consciousness  Allergies  Mr. Lorenson is allergic to azithromycin; clarithromycin; erythromycin; keflex [cephalexin]; metformin; and symbicort [budesonide-formoterol fumarate].  [place]  Drug: Mr. Nehme  reports that he does not use drugs. Alcohol:  reports that he does not drink alcohol. Tobacco:  reports that he has been smoking cigarettes.  He started smoking about 24 years ago. He has a 20.00 pack-year smoking history. He has quit using smokeless tobacco. Medical:  has a past medical history of Anxiety, Arthritis, Asthma, Cataract, Chronic back pain, Chronic pain of left knee, COPD (chronic obstructive pulmonary disease) ([place]), Diabetes mellitus, GERD (gastroesophageal reflux disease), INGUINAL PAIN, LEFT (10/03/2009), Overdose, Shortness of breath, and Sleep apnea. Surgical: Mr. Deckman  has a past surgical history that includes Carpal tunnel release (Left); Shoulder Arthrocentesis (Right); Vasectomy; Hernia repair (Bilateral); Knee arthroscopy with medial menisectomy (Left, 04/22/2014); Knee arthroscopy with medial menisectomy (Right, 12/06/2015); Cataract extraction w/PHACO (Right, 03/11/2016); ORIF wrist fracture (Left, 12/19/2016); and Knee arthroscopy with medial menisectomy (Left, 03/20/2017). Family: family history includes Clotting disorder in his maternal grandmother; Emphysema in his maternal grandfather and maternal grandmother.  Constitutional Exam  General appearance: Well nourished, well developed, and well hydrated. In no apparent acute distress Vitals:   06/01/18 0901  BP: 118/78  Pulse: 94  Temp: 98.2 F (36.8 C)  SpO2: 99%  Weight: 241 lb (109.3 kg)  Height: '5\' 10"'  (1.778 m)  Psych/Mental status: Alert, oriented x 3 (person, place, & time)        Eyes: PERLA Respiratory: No evidence of acute respiratory distress  Gait & Posture Assessment  Ambulation: Unassisted Gait: Relatively normal for age and body habitus Posture: WNL   Lower Extremity Exam    Side: Right lower extremity  Side: Left lower extremity  Stability: No instability observed          Stability: No instability observed          Skin & Extremity Inspection: Skin color, temperature, and hair growth are WNL. No peripheral edema or cyanosis. No masses, redness, swelling, asymmetry, or associated skin lesions. No contractures.  Skin & Extremity Inspection: Skin color, temperature, and hair growth are WNL. No peripheral edema or cyanosis. No masses, redness, swelling, asymmetry,  or associated skin lesions. No contractures.  Functional ROM: Unrestricted ROM                  Functional ROM: Decreased ROM                  Muscle Tone/Strength: Functionally intact. No obvious neuro-muscular anomalies detected.  Muscle Tone/Strength: Functionally intact. No obvious neuro-muscular anomalies detected.  Sensory (Neurological): Unimpaired  Sensory (Neurological): Unimpaired  Palpation: No palpable anomalies  Palpation: No palpable anomalies   Assessment  Primary Diagnosis & Pertinent Problem List: The primary encounter diagnosis was Chronic knee pain (Primary Area of Pain) (Bilateral) (L>R). Diagnoses of Osteoarthritis of the knee (Left), Chronic wrist pain (Secondary Area of Pain) (Left), and Chronic pain syndrome were also pertinent to this visit.  Status Diagnosis  Controlled Controlled Controlled 1. Chronic knee pain (Primary Area of Pain) (Bilateral) (L>R)   2. Osteoarthritis of the knee (Left)   3. Chronic wrist pain (Secondary Area of Pain) (Left)   4. Chronic pain syndrome     Problems updated and reviewed during this visit: No problems updated. Plan of Care  Pharmacotherapy (Medications Ordered): Meds ordered this encounter  Medications  . tapentadol (NUCYNTA) 50  MG tablet    Sig: Take 1 tablet (50 mg total) by mouth every 8 (eight) hours as needed for severe pain. Do not take if you have epilepsy or a history of seizures. Swallow tablets whole. Do not chew, crush or dissolve.    Dispense:  90 tablet    Refill:  0    Do not place this medication, or any other prescription from our practice, on "Automatic Refill". Patient may have prescription filled one day early if pharmacy is closed on scheduled refill date. Do not fill until: 06/17/2018 To last until: 07/17/2018    Order Specific Question:   Supervising Provider    Answer:   Milinda Pointer [phone]  . naloxegol oxalate (MOVANTIK) 25 MG TABS tablet    Sig: Take 1 tablet (25 mg total) by mouth daily. Take on an empty stomach at least 1 hour before or 2 hours after a meal.    Dispense:  30 tablet    Refill:  0    Please instruct the patient not to break the tablet.    Order Specific Question:   Supervising Provider    Answer:   Milinda Pointer [[id number]]   New Prescriptions   No medications on file   Medications administered today: Arn Medal "Richard" had no medications administered during this visit. Lab-work, procedure(s), and/or referral(s): No orders of the defined types were placed in this encounter.  Imaging and/or referral(s): None  Interventional therapies: Planned, scheduled, and/or pending: Not at this time   Considering: Diagnostic Left intra-articular knee injection Diagnostic Left genicular nerve block Possible Left genicular RFA Diagnostic Left Hyalgan series   Palliative PRN treatment(s): Not at this time.      Provider-requested follow-up: Return in about 5 weeks (around 07/06/2018) for MedMgmt with Me Donella Stade Edison Pace).  Future Appointments  Date Time Provider [place]  06/10/2018  8:15 AM Milinda Pointer, MD [facility] None  07/06/2018  9:15 AM Vevelyn Francois, NP [facility] None  05/21/2019  9:00 AM [facility]  05/28/2019  11:00 AM Eustace Pen, LPN [facility]  5/70/1779 11:20 AM Pleas Koch, NP [facility]   Primary Care Physician: Pleas Koch, NP Location: [facility] Note by: Vevelyn Francois NP Date:  06/01/2018; Time: 3:13 PM  Pain Score Disclaimer: We use the NRS-11 scale. This is a self-reported, subjective measurement of pain severity with only modest accuracy. It is used primarily to identify changes within a particular patient. It must be understood that outpatient pain scales are significantly less accurate that those used for research, where they can be applied under ideal controlled circumstances with minimal exposure to variables. In reality, the score is likely to be a combination of pain intensity and pain affect, where pain affect describes the degree of emotional arousal or changes in action readiness caused by the sensory experience of pain. Factors such as social and work situation, setting, emotional state, anxiety levels, expectation, and prior pain experience may influence pain perception and show large inter-individual differences that may also be affected by time variables.  Patient instructions provided during this appointment: Patient Instructions   ____________________________________________________________________________________________  Medication Rules  Applies to: All patients receiving prescriptions (written or electronic).  Pharmacy of record: Pharmacy where electronic prescriptions will be sent. If written prescriptions are taken to a different pharmacy, please inform the nursing staff. The pharmacy listed in the electronic medical record should be the one where you would like electronic prescriptions to be sent.  Prescription refills: Only during scheduled appointments. Applies to both, written and electronic prescriptions.  NOTE: The following applies primarily to controlled substances (Opioid* Pain Medications).    Patient's responsibilities: 1. Pain Pills: Bring all pain pills to every appointment (except for procedure appointments). 2. Pill Bottles: Bring pills in original pharmacy bottle. Always bring newest bottle. Bring bottle, even if empty. 3. Medication refills: You are responsible for knowing and keeping track of what medications you need refilled. The day before your appointment, write a list of all prescriptions that need to be refilled. Bring that list to your appointment and give it to the admitting nurse. Prescriptions will be written only during appointments. If you forget a medication, it will not be "Called in", "Faxed", or "electronically sent". You will need to get another appointment to get these prescribed. 4. Prescription Accuracy: You are responsible for carefully inspecting your prescriptions before leaving our office. Have the discharge nurse carefully go over each prescription with you, before taking them home. Make sure that your name is accurately spelled, that your address is correct. Check the name and dose of your medication to make sure it is accurate. Check the number of pills, and the written instructions to make sure they are clear and accurate. Make sure that you are given enough medication to last until your next medication refill appointment. 5. Taking Medication: Take medication as prescribed. Never take more pills than instructed. Never take medication more frequently than prescribed. Taking less pills or less frequently is permitted and encouraged, when it comes to controlled substances (written prescriptions).  6. Inform other Doctors: Always inform, all of your healthcare providers, of all the medications you take. 7. Pain Medication from other Providers: You are not allowed to accept any additional pain medication from any other Doctor or Healthcare provider. There are two exceptions to this rule. (see below) In the event that you require additional pain medication, you  are responsible for notifying us, as stated below. 8. Medication Agreement: You are responsible for carefully reading and following our Medication Agreement. This must be signed before receiving any prescriptions from our practice. Safely store a copy of your signed Agreement. Violations to the Agreement will result in no further prescriptions. (Additional copies of our Medication Agreement are available  upon request.) 9. Laws, Rules, & Regulations: All patients are expected to follow all Federal and [employer], [employer], Rules, [employer]. Ignorance of the Laws does not constitute a valid excuse. The use of any illegal substances is prohibited. 10. Adopted CDC guidelines & recommendations: Target dosing levels will be at or below 60 MME/day. Use of benzodiazepines** is not recommended.  Exceptions: There are only two exceptions to the rule of not receiving pain medications from other Healthcare Providers. 1. Exception #1 (Emergencies): In the event of an emergency (i.e.: accident requiring emergency care), you are allowed to receive additional pain medication. However, you are responsible for: As soon as you are able, call our office [phone], at any time of the day or night, and leave a message stating your name, the date and nature of the emergency, and the name and dose of the medication prescribed. In the event that your call is answered by a member of our staff, make sure to document and save the date, time, and the name of the person that took your information.  2. Exception #2 (Planned Surgery): In the event that you are scheduled by another doctor or dentist to have any type of surgery or procedure, you are allowed (for a period no longer than 30 days), to receive additional pain medication, for the acute post-op pain. However, in this case, you are responsible for picking up a copy of our "Post-op Pain Management for Surgeons" handout, and giving it to your surgeon or dentist. This document  is available at our office, and does not require an appointment to obtain it. Simply go to our office during business hours (Monday-Thursday from 8:00 AM to 4:00 PM) (Friday 8:00 AM to 12:00 Noon) or if you have a scheduled appointment with [place], prior to your surgery, and ask for it by name. In addition, you will need to provide [place] with your name, name of your surgeon, type of surgery, and date of procedure or surgery.  *Opioid medications include: morphine, codeine, oxycodone, oxymorphone, hydrocodone, hydromorphone, meperidine, tramadol, tapentadol, buprenorphine, fentanyl, methadone. **Benzodiazepine medications include: diazepam (Valium), alprazolam (Xanax), clonazepam (Klonopine), lorazepam (Ativan), clorazepate (Tranxene), chlordiazepoxide (Librium), estazolam (Prosom), oxazepam (Serax), temazepam (Restoril), triazolam (Halcion) (Last updated: 01/29/2018) ____________________________________________________________________________________________    BMI Assessment: Estimated body mass index is 34.58 kg/m as calculated from the following:   Height as of this encounter: '5\' 10"'  (1.778 m).   Weight as of this encounter: 241 lb (109.3 kg).  BMI interpretation table: BMI level Category Range association with higher incidence of chronic pain  <18 kg/m2 Underweight   18.5-24.9 kg/m2 Ideal body weight   25-29.9 kg/m2 Overweight Increased incidence by 20%  30-34.9 kg/m2 Obese (Class I) Increased incidence by 68%  35-39.9 kg/m2 Severe obesity (Class II) Increased incidence by 136%  >40 kg/m2 Extreme obesity (Class III) Increased incidence by 254%   Patient's current BMI Ideal Body weight  Body mass index is 34.58 kg/m. Ideal body weight: 73 kg (160 lb 15 oz) Adjusted ideal body weight: 87.5 kg (192 lb 15.4 oz)   BMI Readings from Last 4 Encounters:  06/01/18 34.58 kg/m  05/21/18 35.33 kg/m  05/20/18 34.71 kg/m  05/06/18 34.58 kg/m   Wt Readings from Last 4 Encounters:  06/01/18 241  lb (109.3 kg)  05/21/18 241 lb (109.3 kg)  05/20/18 236 lb 12 oz (107.4 kg)  05/06/18 241 lb (109.3 kg)

## 2018-06-01 NOTE — Progress Notes (Signed)
Nursing Pain Medication Assessment:  Safety precautions to be maintained throughout the outpatient stay will include: orient to surroundings, keep bed in low position, maintain call bell within reach at all times, provide assistance with transfer out of bed and ambulation.  Medication Inspection Compliance: Pill count conducted under aseptic conditions, in front of the patient. Neither the pills nor the bottle was removed from the patient's sight at any time. Once count was completed pills were immediately returned to the patient in their original bottle.  Medication: Tapentadol (Nucynta) Pill/Patch Count: 55 of 90 pills remain Pill/Patch Appearance: Markings consistent with prescribed medication Bottle Appearance: Standard pharmacy container. Clearly labeled. Filled Date: 6 / 4818 / 2019 Last Medication intake:  Yesterday

## 2018-06-10 ENCOUNTER — Ambulatory Visit: Payer: Medicare Other | Admitting: Pain Medicine

## 2018-06-10 ENCOUNTER — Other Ambulatory Visit: Payer: Self-pay | Admitting: Primary Care

## 2018-06-10 DIAGNOSIS — M25462 Effusion, left knee: Secondary | ICD-10-CM | POA: Diagnosis not present

## 2018-06-10 DIAGNOSIS — N3943 Post-void dribbling: Secondary | ICD-10-CM

## 2018-06-10 DIAGNOSIS — E785 Hyperlipidemia, unspecified: Secondary | ICD-10-CM

## 2018-06-10 NOTE — Progress Notes (Deleted)
No show

## 2018-06-29 DIAGNOSIS — M1712 Unilateral primary osteoarthritis, left knee: Secondary | ICD-10-CM | POA: Diagnosis not present

## 2018-06-29 NOTE — H&P (Signed)
KNEE ARTHROPLASTY ADMISSION H&P  Patient ID: Joseph [name] MRN: [id number] DOB/AGE: 48-Jan-1972 48 y.o.  Chief Complaint: left knee pain.  Planned Procedure Date: 07/21/18 Medical and Cardiac Clearance by Dr. Chestine Spore Additional clearance by Pain Management: Dr. Laban Emperor   HPI: Joseph [name] is a 47 y.o. male smoker with a history of chronic pain, COPD, OSA on CPAP, and Depression who presents for evaluation of OA LEFT KNEE. The patient has a history of pain and functional disability in the left knee due to arthritis and has failed non-surgical conservative treatments for greater than 12 weeks to include NSAID's and/or analgesics, corticosteriod injections, viscosupplementation injections and activity modification.  Onset of symptoms was gradual, starting 4 years ago with gradually worsening course since that time. The patient noted prior procedures on the knee to include  arthroscopy and menisectomy on the left knee.  Patient currently rates pain at 8 out of 10 with activity. Patient has night pain, worsening of pain with activity and weight bearing and pain that interferes with activities of daily living.  Patient has evidence of periarticular osteophytes and joint space narrowing by imaging studies.  MRI shows preserved lateral compartment and areas of full thickness cartilage loss on the medial side.  There is no active infection.  Past Medical History:  Diagnosis Date  . Anxiety   . Arthritis   . Asthma   . Cataract   . Chronic back pain   . Chronic pain of left knee   . COPD (chronic obstructive pulmonary disease) (HCC)   . Diabetes mellitus   . GERD (gastroesophageal reflux disease)   . INGUINAL PAIN, LEFT 10/03/2009   Qualifier: Diagnosis of  By: Darrick Penna MD, Sandi L   . Overdose    2019  . Shortness of breath   . Sleep apnea    Past Surgical History:  Procedure Laterality Date  . CARPAL TUNNEL RELEASE Left   . CATARACT EXTRACTION W/PHACO Right 03/11/2016   Procedure: CATARACT  EXTRACTION PHACO AND INTRAOCULAR LENS PLACEMENT (IOC);  Surgeon: Gemma Payor, MD;  Location: [facility];  Service: Ophthalmology;  Laterality: Right;  CDE 4.24  . HERNIA REPAIR Bilateral   . KNEE ARTHROSCOPY WITH MEDIAL MENISECTOMY Left 04/22/2014   Procedure: LEFT KNEE ARTHROSCOPY WITH MEDIAL MENISECTOMY;  Surgeon: Vickki Hearing, MD;  Location: [facility];  Service: Orthopedics;  Laterality: Left;  . KNEE ARTHROSCOPY WITH MEDIAL MENISECTOMY Right 12/06/2015   Procedure: RIGHT KNEE ARTHROSCOPY WITH MEDIAL MENISECTOMY;  Surgeon: Vickki Hearing, MD;  Location: [facility];  Service: Orthopedics;  Laterality: Right;  . KNEE ARTHROSCOPY WITH MEDIAL MENISECTOMY Left 03/20/2017   Procedure: KNEE ARTHROSCOPY WITH MEDIAL MENISECTOMY;  Surgeon: Vickki Hearing, MD;  Location: [facility];  Service: Orthopedics;  Laterality: Left;  . ORIF WRIST FRACTURE Left 12/19/2016   Procedure: OPEN REDUCTION INTERNAL FIXATION (ORIF) WRIST FRACTURE;  Surgeon: Betha Loa, MD;  Location: [facility];  Service: Orthopedics;  Laterality: Left;  accumed   . SHOULDER ARTHROCENTESIS Right   . VASECTOMY     Allergies  Allergen Reactions  . Azithromycin Hives  . Clarithromycin   . Erythromycin Hives  . Keflex [Cephalexin] Nausea Only  . Metformin Nausea And Vomiting  . Symbicort [Budesonide-Formoterol Fumarate] Other (See Comments)    States that 3 doses were used and breathing became worse   Prior to Admission medications   Medication Sig Start Date End Date Taking? Authorizing Provider  albuterol (PROVENTIL) (2.5 MG/3ML) 0.083% nebulizer solution USE 1 VIAL VIA  NEBULIZER FOUR TIMES DAILY AS NEEDED FOR SHORTNESS OF BREATH 04/30/18   Nyoka Cowden, MD  amoxicillin-clavulanate (AUGMENTIN) 875-125 MG tablet Take 1 tablet by mouth 2 (two) times daily. 05/21/18   Doreene Nest, NP  ANDROGEL PUMP 20.25 MG/ACT (1.62%) GEL Apply 2 Pump topically daily.  02/25/18   [provider]  atorvastatin (LIPITOR) 20 MG  tablet TAKE ONE (1) TABLET EACH DAY EVERY EVENING 06/10/18   Doreene Nest, NP  betamethasone dipropionate (DIPROLENE) 0.05 % cream Apply topically 2 (two) times daily as needed.  03/05/18   [provider]  buPROPion (WELLBUTRIN SR) 150 MG 12 hr tablet TAKE ONE TABLET BY MOUTH TWICE A DAY 02/25/18   Doreene Nest, NP  celecoxib (CELEBREX) 200 MG capsule Take 200 mg by mouth 2 (two) times daily.     [provider]  ciclopirox (LOPROX) 0.77 % cream Apply topically 2 (two) times daily. 12/03/17   Doreene Nest, NP  CINNAMON PO Take 2,800 mg by mouth daily. EACH TABLET IS 1400 MG.  TAKES 2 CAPSULES DAILY    [provider]  citalopram (CELEXA) 40 MG tablet TAKE ONE TABLET BY MOUTH ONCE DAILY 05/28/18   Lorre Munroe, NP  diphenhydrAMINE (BENADRYL) 25 MG tablet Take 25 mg by mouth 2 (two) times daily.    [provider]  empagliflozin (JARDIANCE) 10 MG TABS tablet Take 10 mg by mouth every morning. 09/09/17   Doreene Nest, NP  fluticasone furoate-vilanterol (BREO ELLIPTA) 200-25 MCG/INH AEPB Inhale 1 puff into the lungs daily. 05/23/17   Nyoka Cowden, MD  glipiZIDE (GLUCOTROL) 5 MG tablet TAKE TWO (2) TABLETS BY MOUTH 2 TIMES DAILY 05/11/18   Doreene Nest, NP  ipratropium (ATROVENT) 0.02 % nebulizer solution USE 1 VIAL VIA NEBULIZER FOUR TIMES DAILY AS NEEDED 05/01/18   Doreene Nest, NP  Ipratropium-Albuterol (COMBIVENT) 20-100 MCG/ACT AERS respimat Inhale 1 puff into the lungs every 6 (six) hours as needed for wheezing. 05/23/17   Nyoka Cowden, MD  lansoprazole (PREVACID) 30 MG capsule TAKE ONE CAPSULE BY MOUTH TWICE A DAY BEFORE A MEAL 01/22/18   Doreene Nest, NP  metFORMIN (GLUCOPHAGE-XR) 500 MG 24 hr tablet Take 2 tablets (1,000 mg total) by mouth daily with breakfast. 03/26/18   Cleora Fleet, MD  Multiple Vitamin (MULTIVITAMIN WITH MINERALS) TABS Take 1 tablet by mouth every morning. Men's once daily multivitamin packet  (vitamin e, calcium, ginseng)    [provider]  naloxegol oxalate (MOVANTIK) 25 MG TABS tablet Take 1 tablet (25 mg total) by mouth daily. Take on an empty stomach at least 1 hour before or 2 hours after a meal. 06/01/18 07/01/18  Barbette Merino, NP  Saw Palmetto, Serenoa repens, (SAW PALMETTO PO) Take 1 capsule by mouth daily.    [provider]  tamsulosin (FLOMAX) 0.4 MG CAPS capsule TAKE ONE CAPSULE BY MOUTH DAILY 06/10/18   Doreene Nest, NP  tapentadol (NUCYNTA) 50 MG tablet Take 1 tablet (50 mg total) by mouth every 8 (eight) hours as needed for severe pain. Do not take if you have epilepsy or a history of seizures. Swallow tablets whole. Do not chew, crush or dissolve. 06/17/18 07/17/18  Barbette Merino, NP  traZODone (DESYREL) 150 MG tablet Take 1 tablet (150 mg total) by mouth at bedtime as needed for sleep. 05/19/18   Doreene Nest, NP   Social History   Socioeconomic History  . Marital status:  Legally Separated    Spouse name: Not on file  . Number of children: Not on file  . Years of education: Not on file  . Highest education level: Not on file  Occupational History  . Occupation: disability  Social Needs  . Financial resource strain: Not on file  . Food insecurity:    Worry: Not on file    Inability: Not on file  . Transportation needs:    Medical: Not on file    Non-medical: Not on file  Tobacco Use  . Smoking status: Current Every Day Smoker    Packs/day: 1.00    Years: 20.00    Pack years: 20.00    Types: Cigarettes    Start date: 12/17/1993  . Smokeless tobacco: Former Neurosurgeon  . Tobacco comment: 1ppd as of 11/06/17 ep  Substance and Sexual Activity  . Alcohol use: No    Alcohol/week: 0.0 oz  . Drug use: No  . Sexual activity: Yes    Birth control/protection: Surgical  Lifestyle  . Physical activity:    Days per week: Not on file    Minutes per session: Not on file  . Stress: Not on file  Relationships  . Social connections:     Talks on phone: Not on file    Gets together: Not on file    Attends religious service: Not on file    Active member of club or organization: Not on file    Attends meetings of clubs or organizations: Not on file    Relationship status: Not on file  Other Topics Concern  . Not on file  Social History Narrative   Recent visit to [facility] in [place] d/t increased pain where he was prescribed percocet 7.5-325 mg for pain.   Family History  Problem Relation Age of Onset  . Emphysema Maternal Grandfather   . Emphysema Maternal Grandmother   . Clotting disorder Maternal Grandmother     ROS: Currently denies lightheadedness, dizziness, Fever, chills, CP, SOB. No personal history of DVT, PE, MI, or CVA. No loose teeth.  Top partial. All other systems have been reviewed and were otherwise currently negative with the exception of those mentioned in the HPI and as above.  Objective: Vitals: Ht: 5 feet 10 inches wt: 237 temp: 98.0 BP: 117/77 pulse: 94  O2 96% on room air.   Physical Exam: General: Alert, NAD.  Antalgic Gait  HEENT: EOMI, Good Neck Extension  Pulm: No increased work of breathing.  Clear B/L A/P w/o crackle or wheeze.  CV: RRR, No m/g/r appreciated  GI: soft, NT, ND Neuro: Neuro without gross focal deficit.  Sensation intact distally Skin: No lesions in the area of chief complaint MSK/Surgical Site: Left knee w/o redness or effusion.  Medial JLT. ROM 0-120.  5/5 strength in extension and flexion.  +EHL/FHL.  NVI.  Stable varus and valgus stress.    Imaging Review Plain radiographs and MRI demonstrate severe degenerative joint disease of the left knee.   Preoperative templating of the joint replacement has been completed, documented, and submitted to the Operating Room personnel in order to optimize intra-operative equipment management.  Assessment: OA LEFT KNEE Principal Problem:   Osteoarthritis of the knee (Left) Active Problems:   Cigarette smoker   Chronic pain  syndrome   Diabetes (HCC)   OSA (obstructive sleep apnea)   GERD (gastroesophageal reflux disease)   COPD without exacerbation (HCC)   Opiate use   Opioid-induced constipation (OIC)   Plan: Plan for Procedure(s):  LEFT UNICOMPARTMENTAL KNEE  The patient history, physical exam, clinical judgement of the provider and imaging are consistent with end stage degenerative joint disease and Unicompartmental joint arthroplasty is deemed medically necessary. The treatment options including medical management, injection therapy, and arthroplasty were discussed at length. The risks and benefits of Procedure(s): LEFT UNICOMPARTMENTAL KNEE were presented and reviewed.  The risks of nonoperative treatment, versus surgical intervention including but not limited to continued pain, aseptic loosening, stiffness, dislocation/subluxation, infection, bleeding, nerve injury, blood clots, cardiopulmonary complications, morbidity, mortality, among others were discussed. The patient verbalizes understanding and wishes to proceed with the plan.  Patient is being admitted for inpatient treatment for surgery, pain control, PT, OT, prophylactic antibiotics, VTE prophylaxis, progressive ambulation, ADL's and discharge planning.   Dental prophylaxis discussed and recommended for 2 years postoperatively.   The patient does meet the criteria for TXA which will be used perioperatively.    ASA 81 mg BID will be used postoperatively for DVT prophylaxis in addition to SCDs, and early ambulation.  The patient is planning to be discharged home with outpatient PT in care of his daughter [place] and his fiance.   He will continue chronic pain medicine (Nucynta) and we will Rx typical regimen including Tylenol, Celebrex, Gabapentin, oxycodone.  Continue CPAP in hospital.   Patient's anticipated LOS is less than 2 midnights, meeting these requirements: - Younger than 4665 - Lives within 1 hour of care - Has a competent adult at  home to recover with post-op recover - NO history of  - Coronary Artery Disease  - Heart failure  - Heart attack  - Stroke  - DVT/VTE  - Cardiac arrhythmia  - Renal failure  - Anemia  - Advanced Liver disease   Albina BilletHenry Calvin Martensen III, PA-C 06/29/2018 2:30 PM

## 2018-07-01 ENCOUNTER — Other Ambulatory Visit: Payer: Self-pay | Admitting: Internal Medicine

## 2018-07-01 ENCOUNTER — Other Ambulatory Visit: Payer: Self-pay | Admitting: Primary Care

## 2018-07-01 DIAGNOSIS — E119 Type 2 diabetes mellitus without complications: Secondary | ICD-10-CM

## 2018-07-06 ENCOUNTER — Encounter: Payer: Medicare Other | Admitting: Nurse Practitioner

## 2018-07-08 DIAGNOSIS — J019 Acute sinusitis, unspecified: Secondary | ICD-10-CM | POA: Diagnosis not present

## 2018-07-08 DIAGNOSIS — J209 Acute bronchitis, unspecified: Secondary | ICD-10-CM | POA: Diagnosis not present

## 2018-07-09 ENCOUNTER — Other Ambulatory Visit (HOSPITAL_COMMUNITY): Payer: Self-pay | Admitting: *Deleted

## 2018-07-09 NOTE — Patient Instructions (Addendum)
Joseph ForthJerry R Spilman  07/09/2018   Your procedure is scheduled on: 07-21-18  Report to [facility] Main  Entrance  Report to admitting at 945 AM    Call this number if you have problems the morning of surgery [phone]   Remember: Do not eat food or drink liquids :After Midnight. EAT A HEALTHY BEDTIME SNACK 07-20-18       BRING CPAP MASK AND TUBING  Take these medicines the morning of surgery with A SIP OF WATER: NUCYNTA IF NEEDED ALBUTEROL NEBULIZER AND ATROVENT NEBULIZER IF NEEDED, BREO ELLIPTA INHALER, BUPROPION (WELLBUTRIN), CITALOPRAM (CELEXA), TAMSULOSIN (FLOMAX), LANSOPRAZOLE (PREVACID)  DO NOT TAKE ANY DIABETIC MEDICATIONS DAY OF YOUR SURGERY           How to Manage Your Diabetes Before and After Surgery  Why is it important to control my blood sugar before and after surgery? . Improving blood sugar levels before and after surgery helps healing and can limit problems. . A way of improving blood sugar control is eating a healthy diet by: o  Eating less sugar and carbohydrates o  Increasing activity/exercise o  Talking with your doctor about reaching your blood sugar goals . High blood sugars (greater than 180 mg/dL) can raise your risk of infections and slow your recovery, so you will need to focus on controlling your diabetes during the weeks before surgery. . Make sure that the doctor who takes care of your diabetes knows about your planned surgery including the date and location.  How do I manage my blood sugar before surgery? . Check your blood sugar at least 4 times a day, starting 2 days before surgery, to make sure that the level is not too high or low. o Check your blood sugar the morning of your surgery when you wake up and every 2 hours until you get to the Short Stay unit. . If your blood sugar is less than 70 mg/dL, you will need to treat for low blood sugar: o Do not take insulin. o Treat a low blood sugar (less than 70 mg/dL) with  cup of clear  juice (cranberry or apple), 4 glucose tablets, OR glucose gel. o Recheck blood sugar in 15 minutes after treatment (to make sure it is greater than 70 mg/dL). If your blood sugar is not greater than 70 mg/dL on recheck, call [phone] for further instructions. . Report your blood sugar to the short stay nurse when you get to Short Stay.  . If you are admitted to the hospital after surgery: o Your blood sugar will be checked by the staff and you will probably be given insulin after surgery (instead of oral diabetes medicines) to make sure you have good blood sugar levels. o The goal for blood sugar control after surgery is 80-180 mg/dL.   WHAT DO I DO ABOUT MY DIABETES MEDICATION?  Marland Kitchen. Do not take oral diabetes medicines (pills) the morning of surgery.  THE DAY BEFORE SURGERY TAKE YOUR METFORMIN AS USUAL, DO NOT TAKE YOUR METFORMIN DAY OF SURGERY  TAKE YOUR JARDIANCE AS USUAL DAY BEFORE SURGERY, DO NOT TAKE YOUR THE MORNING OF SURGERY . TAKE ONLY YOUR MORNING DOSE OF GLIPIZIDE DAY BEFORE SURGERY, DO NOT TAKE YOUR AFTERNOON DOSE OF GLIPIZIDDAY BEFORE SURGERY, DO NOT TAKE YOUR GLIPIZIDE DAY OF SURGERY..       Patient Signature:  Date:   Nurse Signature:  Date:   Reviewed and  Endorsed by [facility] Patient Education Committee, August 2015                    You may not have any metal on your body including hair pins and              piercings  Do not wear jewelry, make-up, lotions, powders or perfumes, deodorant             Do not wear nail polish.  Do not shave  48 hours prior to surgery.              Men may shave face and neck.   Do not bring valuables to the hospital. [facility] IS NOT             RESPONSIBLE   FOR VALUABLES.  Contacts, dentures or bridgework may not be worn into surgery.  Leave suitcase in the car. After surgery it may be brought to your room.                 Please read over the following fact sheets you were  given: _____________________________________________________________________  [facility] - Preparing for Surgery Before surgery, you can play an important role.  Because skin is not sterile, your skin needs to be as free of germs as possible.  You can reduce the number of germs on your skin by washing with CHG (chlorahexidine gluconate) soap before surgery.  CHG is an antiseptic cleaner which kills germs and bonds with the skin to continue killing germs even after washing. Please DO NOT use if you have an allergy to CHG or antibacterial soaps.  If your skin becomes reddened/irritated stop using the CHG and inform your nurse when you arrive at Short Stay. Do not shave (including legs and underarms) for at least 48 hours prior to the first CHG shower.  You may shave your face/neck. Please follow these instructions carefully:  1.  Shower with CHG Soap the night before surgery and the  morning of Surgery.  2.  If you choose to wash your hair, wash your hair first as usual with your  normal  shampoo.  3.  After you shampoo, rinse your hair and body thoroughly to remove the  shampoo.                           4.  Use CHG as you would any other liquid soap.  You can apply chg directly  to the skin and wash                       Gently with a scrungie or clean washcloth.  5.  Apply the CHG Soap to your body ONLY FROM THE NECK DOWN.   Do not use on face/ open                           Wound or open sores. Avoid contact with eyes, ears mouth and genitals (private parts).                       Wash face,  Genitals (private parts) with your normal soap.             6.  Wash thoroughly, paying special attention to the area where your surgery  will be performed.  7.  Thoroughly rinse your body with warm water  from the neck down.  8.  DO NOT shower/wash with your normal soap after using and rinsing off  the CHG Soap.                9.  Pat yourself dry with a clean towel.            10.  Wear clean pajamas.             11.  Place clean sheets on your bed the night of your first shower and do not  sleep with pets. Day of Surgery : Do not apply any lotions/deodorants the morning of surgery.  Please wear clean clothes to the hospital/surgery center.   Incentive Spirometer  An incentive spirometer is a tool that can help keep your lungs clear and active. This tool measures how well you are filling your lungs with each breath. Taking long deep breaths may help reverse or decrease the chance of developing breathing (pulmonary) problems (especially infection) following:  A long period of time when you are unable to move or be active. BEFORE THE PROCEDURE   If the spirometer includes an indicator to show your best effort, your nurse or respiratory therapist will set it to a desired goal.  If possible, sit up straight or lean slightly forward. Try not to slouch.  Hold the incentive spirometer in an upright position. INSTRUCTIONS FOR USE  1. Sit on the edge of your bed if possible, or sit up as far as you can in bed or on a chair. 2. Hold the incentive spirometer in an upright position. 3. Breathe out normally. 4. Place the mouthpiece in your mouth and seal your lips tightly around it. 5. Breathe in slowly and as deeply as possible, raising the piston or the ball toward the top of the column. 6. Hold your breath for 3-5 seconds or for as long as possible. Allow the piston or ball to fall to the bottom of the column. 7. Remove the mouthpiece from your mouth and breathe out normally. 8. Rest for a few seconds and repeat Steps 1 through 7 at least 10 times every 1-2 hours when you are awake. Take your time and take a few normal breaths between deep breaths. 9. The spirometer may include an indicator to show your best effort. Use the indicator as a goal to work toward during each repetition. 10. After each set of 10 deep breaths, practice coughing to be sure your lungs are clear. If you have an incision (the  cut made at the time of surgery), support your incision when coughing by placing a pillow or rolled up towels firmly against it. Once you are able to get out of bed, walk around indoors and cough well. You may stop using the incentive spirometer when instructed by your caregiver.  RISKS AND COMPLICATIONS  Take your time so you do not get dizzy or light-headed.  If you are in pain, you may need to take or ask for pain medication before doing incentive spirometry. It is harder to take a deep breath if you are having pain. AFTER USE  Rest and breathe slowly and easily.  It can be helpful to keep track of a log of your progress. Your caregiver can provide you with a simple table to help with this. If you are using the spirometer at home, follow these instructions: SEEK MEDICAL CARE IF:   You are having difficultly using the spirometer.  You have trouble using the spirometer as often  as instructed.  Your pain medication is not giving enough relief while using the spirometer.  You develop fever of 100.5 F (38.1 C) or higher. SEEK IMMEDIATE MEDICAL CARE IF:   You cough up bloody sputum that had not been present before.  You develop fever of 102 F (38.9 C) or greater.  You develop worsening pain at or near the incision site. MAKE SURE YOU:   Understand these instructions.  Will watch your condition.  Will get help right away if you are not doing well or get worse. Document Released: 03/31/2007 Document Revised: 02/10/2012 Document Reviewed: 06/01/2007 [facility] Patient Information 2014 [place], [place].   ________________________________________________________________________

## 2018-07-09 NOTE — Progress Notes (Signed)
EKG 03-24-18 EPIC

## 2018-07-13 ENCOUNTER — Encounter (HOSPITAL_COMMUNITY)
Admission: RE | Admit: 2018-07-13 | Discharge: 2018-07-13 | Disposition: A | Discharge status: Home / Self Care | Payer: Medicare Other | Source: Ambulatory Visit | Attending: Orthopedic Surgery | Admitting: Orthopedic Surgery

## 2018-07-13 ENCOUNTER — Ambulatory Visit: Payer: Medicare Other | Attending: Nurse Practitioner | Admitting: Nurse Practitioner

## 2018-07-13 ENCOUNTER — Other Ambulatory Visit: Payer: Self-pay

## 2018-07-13 ENCOUNTER — Other Ambulatory Visit: Payer: Self-pay | Admitting: Orthopedic Surgery

## 2018-07-13 ENCOUNTER — Encounter: Payer: Self-pay | Admitting: Nurse Practitioner

## 2018-07-13 ENCOUNTER — Encounter (HOSPITAL_COMMUNITY): Payer: Self-pay

## 2018-07-13 VITALS — BP 122/79 | HR 87 | Temp 98.9°F | Ht 70.0 in | Wt 232.0 lb

## 2018-07-13 DIAGNOSIS — M1712 Unilateral primary osteoarthritis, left knee: Secondary | ICD-10-CM | POA: Diagnosis not present

## 2018-07-13 DIAGNOSIS — Z7984 Long term (current) use of oral hypoglycemic drugs: Secondary | ICD-10-CM | POA: Insufficient documentation

## 2018-07-13 DIAGNOSIS — M25532 Pain in left wrist: Secondary | ICD-10-CM | POA: Insufficient documentation

## 2018-07-13 DIAGNOSIS — Z01812 Encounter for preprocedural laboratory examination: Secondary | ICD-10-CM | POA: Diagnosis not present

## 2018-07-13 DIAGNOSIS — Z881 Allergy status to other antibiotic agents status: Secondary | ICD-10-CM | POA: Insufficient documentation

## 2018-07-13 DIAGNOSIS — E119 Type 2 diabetes mellitus without complications: Secondary | ICD-10-CM | POA: Diagnosis not present

## 2018-07-13 DIAGNOSIS — E785 Hyperlipidemia, unspecified: Secondary | ICD-10-CM | POA: Diagnosis not present

## 2018-07-13 DIAGNOSIS — M25562 Pain in left knee: Secondary | ICD-10-CM

## 2018-07-13 DIAGNOSIS — Z5181 Encounter for therapeutic drug level monitoring: Secondary | ICD-10-CM | POA: Insufficient documentation

## 2018-07-13 DIAGNOSIS — Z79899 Other long term (current) drug therapy: Secondary | ICD-10-CM | POA: Diagnosis not present

## 2018-07-13 DIAGNOSIS — Z9841 Cataract extraction status, right eye: Secondary | ICD-10-CM | POA: Diagnosis not present

## 2018-07-13 DIAGNOSIS — J449 Chronic obstructive pulmonary disease, unspecified: Secondary | ICD-10-CM | POA: Insufficient documentation

## 2018-07-13 DIAGNOSIS — G8929 Other chronic pain: Secondary | ICD-10-CM | POA: Diagnosis not present

## 2018-07-13 DIAGNOSIS — Z6833 Body mass index (BMI) 33.0-33.9, adult: Secondary | ICD-10-CM | POA: Insufficient documentation

## 2018-07-13 DIAGNOSIS — G894 Chronic pain syndrome: Secondary | ICD-10-CM | POA: Diagnosis not present

## 2018-07-13 DIAGNOSIS — Z9889 Other specified postprocedural states: Secondary | ICD-10-CM | POA: Insufficient documentation

## 2018-07-13 DIAGNOSIS — M25561 Pain in right knee: Secondary | ICD-10-CM | POA: Diagnosis not present

## 2018-07-13 DIAGNOSIS — F17210 Nicotine dependence, cigarettes, uncomplicated: Secondary | ICD-10-CM | POA: Diagnosis not present

## 2018-07-13 DIAGNOSIS — K219 Gastro-esophageal reflux disease without esophagitis: Secondary | ICD-10-CM | POA: Diagnosis not present

## 2018-07-13 DIAGNOSIS — F329 Major depressive disorder, single episode, unspecified: Secondary | ICD-10-CM | POA: Diagnosis not present

## 2018-07-13 DIAGNOSIS — M25462 Effusion, left knee: Secondary | ICD-10-CM | POA: Insufficient documentation

## 2018-07-13 DIAGNOSIS — F419 Anxiety disorder, unspecified: Secondary | ICD-10-CM | POA: Diagnosis not present

## 2018-07-13 DIAGNOSIS — G4700 Insomnia, unspecified: Secondary | ICD-10-CM | POA: Diagnosis not present

## 2018-07-13 DIAGNOSIS — G4733 Obstructive sleep apnea (adult) (pediatric): Secondary | ICD-10-CM | POA: Diagnosis not present

## 2018-07-13 LAB — HEMOGLOBIN A1C
Hgb A1c MFr Bld: 7.1 % — ABNORMAL HIGH (ref 4.8–5.6)
Mean Plasma Glucose: 157.07 mg/dL

## 2018-07-13 LAB — BASIC METABOLIC PANEL
ANION GAP: 10 (ref 5–15)
BUN: 8 mg/dL (ref 6–20)
CALCIUM: 8.9 mg/dL (ref 8.9–10.3)
CO2: 27 mmol/L (ref 22–32)
Chloride: 103 mmol/L (ref 98–111)
Creatinine, Ser: 0.74 mg/dL (ref 0.61–1.24)
Glucose, Bld: 157 mg/dL — ABNORMAL HIGH (ref 70–99)
Potassium: 3.8 mmol/L (ref 3.5–5.1)
Sodium: 140 mmol/L (ref 135–145)

## 2018-07-13 LAB — SURGICAL PCR SCREEN
MRSA, PCR: NEGATIVE
Staphylococcus aureus: NEGATIVE

## 2018-07-13 LAB — CBC
HCT: 43.8 % (ref 39.0–52.0)
HEMOGLOBIN: 15 g/dL (ref 13.0–17.0)
MCH: 30.7 pg (ref 26.0–34.0)
MCHC: 34.2 g/dL (ref 30.0–36.0)
MCV: 89.8 fL (ref 78.0–100.0)
Platelets: 202 10*3/uL (ref 150–400)
RBC: 4.88 MIL/uL (ref 4.22–5.81)
RDW: 13.4 % (ref 11.5–15.5)
WBC: 9.8 10*3/uL (ref 4.0–10.5)

## 2018-07-13 LAB — GLUCOSE, CAPILLARY: GLUCOSE-CAPILLARY: 188 mg/dL — AB (ref 70–99)

## 2018-07-13 MED ORDER — TAPENTADOL HCL 50 MG PO TABS: 50.0000 mg | ORAL_TABLET | Freq: Three times a day (TID) | ORAL | 0 refills | Status: DC | PRN

## 2018-07-13 NOTE — Care Plan (Signed)
Spoke with patient prior to surgery. He plans to discharge to home with family to assist and the following arrangements:  °  °DME; RW and 3n1 ordered to be delivered to room prior to discharge from [facility].  °OPPT: [facility] OPPT - [facility] [place] - 07/23/18 @ 1100  °MD Follow Up: 08/05/18 @ 400. °  °Please contact Renee Angiulli, RNCM. [phone] with questions or if this plan should need to change  °  °Thanks  °

## 2018-07-13 NOTE — Patient Instructions (Addendum)
____________________________________________________________________________________________  Medication Rules  Applies to: All patients receiving prescriptions (written or electronic).  Pharmacy of record: Pharmacy where electronic prescriptions will be sent. If written prescriptions are taken to a different pharmacy, please inform the nursing staff. The pharmacy listed in the electronic medical record should be the one where you would like electronic prescriptions to be sent.  Prescription refills: Only during scheduled appointments. Applies to both, written and electronic prescriptions.  NOTE: The following applies primarily to controlled substances (Opioid* Pain Medications).   Patient's responsibilities: 1. Pain Pills: Bring all pain pills to every appointment (except for procedure appointments). 2. Pill Bottles: Bring pills in original pharmacy bottle. Always bring newest bottle. Bring bottle, even if empty. 3. Medication refills: You are responsible for knowing and keeping track of what medications you need refilled. The day before your appointment, write a list of all prescriptions that need to be refilled. Bring that list to your appointment and give it to the admitting nurse. Prescriptions will be written only during appointments. If you forget a medication, it will not be "Called in", "Faxed", or "electronically sent". You will need to get another appointment to get these prescribed. 4. Prescription Accuracy: You are responsible for carefully inspecting your prescriptions before leaving our office. Have the discharge nurse carefully go over each prescription with you, before taking them home. Make sure that your name is accurately spelled, that your address is correct. Check the name and dose of your medication to make sure it is accurate. Check the number of pills, and the written instructions to make sure they are clear and accurate. Make sure that you are given enough medication to last  until your next medication refill appointment. 5. Taking Medication: Take medication as prescribed. Never take more pills than instructed. Never take medication more frequently than prescribed. Taking less pills or less frequently is permitted and encouraged, when it comes to controlled substances (written prescriptions).  6. Inform other Doctors: Always inform, all of your healthcare providers, of all the medications you take. 7. Pain Medication from other Providers: You are not allowed to accept any additional pain medication from any other Doctor or Healthcare provider. There are two exceptions to this rule. (see below) In the event that you require additional pain medication, you are responsible for notifying us, as stated below. 8. Medication Agreement: You are responsible for carefully reading and following our Medication Agreement. This must be signed before receiving any prescriptions from our practice. Safely store a copy of your signed Agreement. Violations to the Agreement will result in no further prescriptions. (Additional copies of our Medication Agreement are available upon request.) 9. Laws, Rules, & Regulations: All patients are expected to follow all Federal and State Laws, Statutes, Rules, & Regulations. Ignorance of the Laws does not constitute a valid excuse. The use of any illegal substances is prohibited. 10. Adopted CDC guidelines & recommendations: Target dosing levels will be at or below 60 MME/day. Use of benzodiazepines** is not recommended.  Exceptions: There are only two exceptions to the rule of not receiving pain medications from other Healthcare Providers. 1. Exception #1 (Emergencies): In the event of an emergency (i.e.: accident requiring emergency care), you are allowed to receive additional pain medication. However, you are responsible for: As soon as you are able, call our office [phone], at any time of the day or night, and leave a message stating your name, the  date and nature of the emergency, and the name and dose of the medication   prescribed. In the event that your call is answered by a member of our staff, make sure to document and save the date, time, and the name of the person that took your information.  2. Exception #2 (Planned Surgery): In the event that you are scheduled by another doctor or dentist to have any type of surgery or procedure, you are allowed (for a period no longer than 30 days), to receive additional pain medication, for the acute post-op pain. However, in this case, you are responsible for picking up a copy of our "Post-op Pain Management for Surgeons" handout, and giving it to your surgeon or dentist. This document is available at our office, and does not require an appointment to obtain it. Simply go to our office during business hours (Monday-Thursday from 8:00 AM to 4:00 PM) (Friday 8:00 AM to 12:00 Noon) or if you have a scheduled appointment with us, prior to your surgery, and ask for it by name. In addition, you will need to provide us with your name, name of your surgeon, type of surgery, and date of procedure or surgery.  *Opioid medications include: morphine, codeine, oxycodone, oxymorphone, hydrocodone, hydromorphone, meperidine, tramadol, tapentadol, buprenorphine, fentanyl, methadone. **Benzodiazepine medications include: diazepam (Valium), alprazolam (Xanax), clonazepam (Klonopine), lorazepam (Ativan), clorazepate (Tranxene), chlordiazepoxide (Librium), estazolam (Prosom), oxazepam (Serax), temazepam (Restoril), triazolam (Halcion) (Last updated: 01/29/2018) ____________________________________________________________________________________________    BMI Assessment: Estimated body mass index is 33.29 kg/m as calculated from the following:   Height as of this encounter: 5\' 10"  (1.778 m).   Weight as of this encounter: 232 lb (105.2 kg).  BMI interpretation table: BMI level Category Range association with higher  incidence of chronic pain  <18 kg/m2 Underweight   18.5-24.9 kg/m2 Ideal body weight   25-29.9 kg/m2 Overweight Increased incidence by 20%  30-34.9 kg/m2 Obese (Class I) Increased incidence by 68%  35-39.9 kg/m2 Severe obesity (Class II) Increased incidence by 136%  >40 kg/m2 Extreme obesity (Class III) Increased incidence by 254%   Patient's current BMI Ideal Body weight  Body mass index is 33.29 kg/m. Ideal body weight: 73 kg (160 lb 15 oz) Adjusted ideal body weight: 85.9 kg (189 lb 5.8 oz)   BMI Readings from Last 4 Encounters:  07/13/18 33.29 kg/m  06/01/18 34.58 kg/m  05/21/18 35.33 kg/m  05/20/18 34.71 kg/m   Wt Readings from Last 4 Encounters:  07/13/18 232 lb (105.2 kg)  06/01/18 241 lb (109.3 kg)  05/21/18 241 lb (109.3 kg)  05/20/18 236 lb 12 oz (107.4 kg)

## 2018-07-13 NOTE — Progress Notes (Signed)
Requested pain management clearcne note and medical clearance note from kelly high schedulder for dr Eulah Pontmurphy to be faxed to [phone]

## 2018-07-13 NOTE — Progress Notes (Signed)
Nursing Pain Medication Assessment:  Safety precautions to be maintained throughout the outpatient stay will include: orient to surroundings, keep bed in low position, maintain call bell within reach at all times, provide assistance with transfer out of bed and ambulation.  Medication Inspection Compliance: Pill count conducted under aseptic conditions, in front of the patient. Neither the pills nor the bottle was removed from the patient's sight at any time. Once count was completed pills were immediately returned to the patient in their original bottle.  Medication: Tapentadol (Nucynta) Pill/Patch Count: 19 of 90 pills remain Pill/Patch Appearance: Markings consistent with prescribed medication Bottle Appearance: Standard pharmacy container. Clearly labeled. Filled Date: 7 / 17 / 2019 Last Medication intake:  Today

## 2018-07-13 NOTE — Progress Notes (Signed)
Patient's Name: Joseph [name]  MRN: [id number]  Referring Provider: Pleas Koch, NP  DOB: 07/25/1971  PCP: Pleas Koch, NP  DOS: 07/13/2018  Note by: Vevelyn Francois NP  Service setting: Ambulatory outpatient  Specialty: Interventional Pain Management  Location: [facility] (AMB) Pain Management Facility    Patient type: Established    Primary Reason(s) for Visit: Encounter for prescription drug management. (Level of risk: moderate)  CC: Knee Pain (left)  HPI  Joseph [name] is a 47 y.o. year old, male patient, who comes today for a medication management evaluation. He has Mediastinal abnormality; Cigarette smoker; Chronic pain syndrome; Diabetes ([place]); Intrinsic asthma; Medial meniscus, posterior horn derangement; S/P knee surgery; Meniscal tear of knee, sequela (Left); Testosterone deficiency; OSA (obstructive sleep apnea); Morbid obesity ([place]); Anxiety and depression; Bucket handle tear of meniscus of knee (Right); Skin lesions; GERD (gastroesophageal reflux disease); Osteoarthritis of the knee (Left); Post-void dribbling; COPD without exacerbation ([place]); Hyperlipidemia; Chronic constipation; Disorder of skeletal system; Chronic knee pain (Primary Area of Pain) (Bilateral) (L>R); Chronic wrist pain (Secondary Area of Pain) (Left); Insomnia; Male hypogonadism; Pharmacologic therapy; Problems influencing health status; Long term prescription opiate use; Opiate use; Tinea corporis; Tinea versicolor; Arthropathy of knee (Left); Opioid-induced constipation (OIC); Opiate overdose ([place]); Acute encephalopathy; Encephalopathy acute; Effusion of knee joint (Left); and Medicare annual wellness visit, initial on their problem list. His primarily concern today is the Knee Pain (left)  Pain Assessment: Location: Left Knee(left) Radiating: denies Onset: More than a month ago Duration: Chronic pain Quality: Pressure Severity: 1 /10 (subjective, self-reported pain score)  Note: Reported level is compatible  with observation.                          Effect on ADL: prolonged walking Timing: Constant Modifying factors: medications BP: 812/75  HR: 87  Joseph [name] was last scheduled for an appointment on 06/01/2018 for medication management. During today's appointment we reviewed Joseph [name] chronic pain status, as well as his outpatient medication regimen. He admits that he is going to have his left knee replacement on next Tuesday, Dr Fredonia Highland. He denies any concerns or questions today.   The patient  reports that he does not use drugs. His body mass index is 33.29 kg/m.  Further details on both, my assessment(s), as well as the proposed treatment plan, please see below.  Controlled Substance Pharmacotherapy Assessment REMS (Risk Evaluation and Mitigation Strategy)  Analgesic:Nucynta 50 mg 3 times daily(150 mg/dayof Nucynta) MME/day:28m/day BChauncey Fischer RN  07/13/2018 11:02 AM  Sign at close encounter Nursing Pain Medication Assessment:  Safety precautions to be maintained throughout the outpatient stay will include: orient to surroundings, keep bed in low position, maintain call bell within reach at all times, provide assistance with transfer out of bed and ambulation.  Medication Inspection Compliance: Pill count conducted under aseptic conditions, in front of the patient. Neither the pills nor the bottle was removed from the patient's sight at any time. Once count was completed pills were immediately returned to the patient in their original bottle.  Medication: Tapentadol (Nucynta) Pill/Patch Count: 19 of 90 pills remain Pill/Patch Appearance: Markings consistent with prescribed medication Bottle Appearance: Standard pharmacy container. Clearly labeled. Filled Date: 7 / 17 / 2019 Last Medication intake:  Today   Pharmacokinetics: Liberation and absorption (onset of action): WNL Distribution (time to peak effect): WNL Metabolism and excretion (duration of action): WNL  Pharmacodynamics: Desired effects: Analgesia: Mr. Byard reports >19% benefit. Functional ability: Patient reports that medication allows him to accomplish basic ADLs Clinically meaningful improvement in function (CMIF): Sustained CMIF goals met Perceived effectiveness: Described as relatively effective, allowing for increase in activities of daily living (ADL) Undesirable effects: Side-effects or Adverse reactions: None reported Monitoring: [place] PMP: Online review of the past 75-monthperiod conducted. Compliant with practice rules and regulations Last UDS on record: Summary  Date Value Ref Range Status  05/06/2018 FINAL  Final    Comment:    ==================================================================== TOXASSURE SELECT 13 (MW) ==================================================================== Test                             Result       Flag       Units Drug Present and Declared for Prescription Verification   Tapentadol                     >9259        EXPECTED   ng/mg creat    Source of tapentadol is a scheduled prescription medication. Drug Present not Declared for Prescription Verification   Alcohol, Ethyl                 >0.400       UNEXPECTED g/dL    Sources of ethyl alcohol include alcoholic beverages or as a    fermentation product of glucose; glucose is present in this    specimen.  The high concentration of ethyl alcohol and the    presence of glucose supports fermentation as the source of ethyl    alcohol in this specimen. ==================================================================== Test                      Result    Flag   Units      Ref Range   Creatinine              108              mg/dL      >=20 ==================================================================== Declared Medications:  The flagging and interpretation on this report are based on the  following declared medications.  Unexpected results may arise from  inaccuracies in the  declared medications.  **Note: The testing scope of this panel includes these medications:  Tapentadol  **Note: The testing scope of this panel does not include following  reported medications:  Albuterol  Albuterol (Ipratropium-Albuterol)  Atorvastatin  Betamethasone  Bupropion  Celecoxib  Ciclopirox  Cinnamon Bark  Citalopram  Diphenhydramine  Empagliflozin  Fluticasone (Breo)  Glipizide  Ipratropium  Ipratropium (Ipratropium-Albuterol)  Lansoprazole  Metformin  Multivitamin (MVI)  Naloxegol  Supplement (Saw Palmetto)  Tamsulosin  Testosterone  Trazodone  Vilanterol (Breo) ==================================================================== For clinical consultation, please call ([phone] ====================================================================    UDS interpretation: Non-Compliant         ETOH maybe related to Diabetes  Medication Assessment Form: Reviewed. Patient indicates being compliant with therapy Treatment compliance: Compliant Risk Assessment Profile: Aberrant behavior: See prior evaluations. None observed or detected today Comorbid factors increasing risk of overdose: history of previous overdose Risk of substance use disorder (SUD): High-to-Very High Opioid Risk Tool - 07/13/18 1110      Family History of Substance Abuse   Alcohol  Positive Male    Illegal Drugs  Negative    Rx Drugs  Negative      Personal History of  Substance Abuse   Alcohol  Negative    Illegal Drugs  Negative    Rx Drugs  Negative      Total Score   Opioid Risk Tool Scoring  3    Opioid Risk Interpretation  Low Risk      ORT Scoring interpretation table:  Score <3 = Low Risk for SUD  Score between 4-7 = Moderate Risk for SUD  Score >8 = High Risk for Opioid Abuse   Risk Mitigation Strategies:  Patient Counseling: Covered Patient-Prescriber Agreement (PPA): Present and active  Notification to other healthcare providers: Done  Pharmacologic Plan: No  change in therapy, at this time.             Laboratory Chemistry  Inflammation Markers (CRP: Acute Phase) (ESR: Chronic Phase) Lab Results  Component Value Date   CRP 0.7 11/05/2017   ESRSEDRATE 11 11/05/2017   LATICACIDVEN 2.1 01/24/2013                         Rheumatology Markers No results found for: RF, ANA, LABURIC, URICUR, LYMEIGGIGMAB, LYMEABIGMQN, HLAB27                      Renal Function Markers Lab Results  Component Value Date   BUN 7 03/27/2018   CREATININE 0.88 03/27/2018   BCR 10 11/05/2017   GFRAA >60 03/25/2018   GFRNONAA >60 03/25/2018                             Hepatic Function Markers Lab Results  Component Value Date   AST 11 (L) 03/25/2018   ALT 13 (L) 03/25/2018   ALBUMIN 3.6 03/25/2018   ALKPHOS 67 03/25/2018   LIPASE 38 01/24/2013                        Electrolytes Lab Results  Component Value Date   NA 140 03/27/2018   K 4.0 03/27/2018   CL 102 03/27/2018   CALCIUM 9.1 03/27/2018   MG 2.1 11/05/2017                        Neuropathy Markers Lab Results  Component Value Date   VITAMINB12 341 11/05/2017   HGBA1C 6.7 (H) 03/24/2018   HIV Non Reactive 03/24/2018                        Bone Pathology Markers Lab Results  Component Value Date   25OHVITD1 42 11/05/2017   25OHVITD2 <1.0 11/05/2017   25OHVITD3 42 11/05/2017   TESTOSTERONE 208.98 (L) 04/17/2017                         Coagulation Parameters Lab Results  Component Value Date   PLT 173 03/25/2018   DDIMER 0.46 03/07/2012                        Cardiovascular Markers Lab Results  Component Value Date   TROPONINI <0.30 12/09/2013   HGB 13.8 03/25/2018   HCT 42.3 03/25/2018                         CA Markers No results found for: CEA, CA125, LABCA2  Note: Lab results reviewed.  Recent Diagnostic Imaging Results  DG C-Arm 1-60 Min-No Report Fluoroscopy was utilized by the requesting physician.  No radiographic  interpretation.    Complexity Note: Imaging results reviewed. Results shared with Joseph [name], using Layman's terms.                         Meds   Current Outpatient Medications:  .  albuterol (PROVENTIL) (2.5 MG/3ML) 0.083% nebulizer solution, USE 1 VIAL VIA NEBULIZER FOUR TIMES DAILY AS NEEDED FOR SHORTNESS OF BREATH, Disp: 360 mL, Rfl: 0 .  ANDROGEL PUMP 20.25 MG/ACT (1.62%) GEL, Apply 2 Pump topically daily. , Disp: , Rfl:  .  atorvastatin (LIPITOR) 20 MG tablet, TAKE ONE (1) TABLET EACH DAY EVERY EVENING, Disp: 90 tablet, Rfl: 1 .  betamethasone dipropionate (DIPROLENE) 0.05 % cream, Apply topically 2 (two) times daily as needed. , Disp: , Rfl:  .  BREO ELLIPTA 200-25 MCG/INH AEPB, INHALE ONE PUFF INTO THE LUNGS DAILY, Disp: 60 each, Rfl: 0 .  buPROPion (WELLBUTRIN SR) 150 MG 12 hr tablet, TAKE ONE TABLET BY MOUTH TWICE A DAY, Disp: 180 tablet, Rfl: 1 .  celecoxib (CELEBREX) 200 MG capsule, Take 200 mg by mouth 2 (two) times daily. , Disp: , Rfl:  .  citalopram (CELEXA) 40 MG tablet, TAKE ONE TABLET BY MOUTH ONCE DAILY, Disp: 30 tablet, Rfl: 11 .  diphenhydrAMINE (BENADRYL) 25 MG tablet, Take 25 mg by mouth 2 (two) times daily., Disp: , Rfl:  .  empagliflozin (JARDIANCE) 10 MG TABS tablet, Take 10 mg by mouth every morning., Disp: 90 tablet, Rfl: 2 .  glipiZIDE (GLUCOTROL) 5 MG tablet, TAKE TWO (2) TABLETS BY MOUTH 2 TIMES DAILY, Disp: 360 tablet, Rfl: 1 .  ipratropium (ATROVENT) 0.02 % nebulizer solution, USE 1 VIAL VIA NEBULIZER FOUR TIMES DAILY AS NEEDED, Disp: 300 mL, Rfl: 0 .  Ipratropium-Albuterol (COMBIVENT) 20-100 MCG/ACT AERS respimat, Inhale 1 puff into the lungs every 6 (six) hours as needed for wheezing., Disp: 1 Inhaler, Rfl: 2 .  lansoprazole (PREVACID) 30 MG capsule, TAKE ONE CAPSULE BY MOUTH TWICE A DAY BEFORE A MEAL, Disp: 180 capsule, Rfl: 1 .  metFORMIN (GLUCOPHAGE-XR) 500 MG 24 hr tablet, Take 2 tablets (1,000 mg total) by mouth daily with breakfast., Disp: , Rfl:  .  Multiple Vitamin  (MULTIVITAMIN WITH MINERALS) TABS, Take 1 tablet by mouth every morning. Men's once daily multivitamin packet (vitamin e, calcium, ginseng), Disp: , Rfl:  .  Saw Palmetto, Serenoa repens, (SAW PALMETTO PO), Take 1 capsule by mouth daily., Disp: , Rfl:  .  tamsulosin (FLOMAX) 0.4 MG CAPS capsule, TAKE ONE CAPSULE BY MOUTH DAILY, Disp: 90 capsule, Rfl: 1 .  [START ON 07/17/2018] tapentadol (NUCYNTA) 50 MG tablet, Take 1 tablet (50 mg total) by mouth every 8 (eight) hours as needed for severe pain. Swallow tablets whole. Do not chew, crush or dissolve., Disp: 90 tablet, Rfl: 0 .  traZODone (DESYREL) 150 MG tablet, Take 1 tablet (150 mg total) by mouth at bedtime as needed for sleep., Disp: 90 tablet, Rfl: 0 .  Turmeric 500 MG CAPS, Take 1 capsule by mouth daily., Disp: , Rfl:   ROS  Constitutional: Denies any fever or chills Gastrointestinal: No reported hemesis, hematochezia, vomiting, or acute GI distress Musculoskeletal: Denies any acute onset joint swelling, redness, loss of ROM, or weakness Neurological: No reported episodes of acute onset apraxia, aphasia, dysarthria, agnosia, amnesia, paralysis, loss of coordination, or  loss of consciousness  Allergies  Joseph [name] is allergic to azithromycin; clarithromycin; erythromycin; keflex [cephalexin]; metformin; and symbicort [budesonide-formoterol fumarate].  [place]  Drug: Joseph [name]  reports that he does not use drugs. Alcohol:  reports that he does not drink alcohol. Tobacco:  reports that he has been smoking cigarettes. He started smoking about 24 years ago. He has a 26.00 pack-year smoking history. He has never used smokeless tobacco. Medical:  has a past medical history of Anxiety, Arthritis, Asthma, Cataract, Chronic back pain, Chronic pain of left knee, COPD (chronic obstructive pulmonary disease) ([place]), Diabetes mellitus, GERD (gastroesophageal reflux disease), INGUINAL PAIN, LEFT (10/03/2009), Overdose, Shortness of breath, and Sleep  apnea. Surgical: Joseph [name]  has a past surgical history that includes Carpal tunnel release (Left); Shoulder Arthrocentesis (Right, 2008 or 2010); Vasectomy (2014); Knee arthroscopy with medial menisectomy (Left, 04/22/2014); Knee arthroscopy with medial menisectomy (Right, 12/06/2015); Cataract extraction w/PHACO (Right, 03/11/2016); ORIF wrist fracture (Left, 12/19/2016); Knee arthroscopy with medial menisectomy (Left, 03/20/2017); Eye surgery (Right, 2018); and Hernia repair (Bilateral, 2010). Family: family history includes Clotting disorder in his maternal grandmother; Emphysema in his maternal grandfather and maternal grandmother.  Constitutional Exam  General appearance: Well nourished, well developed, and well hydrated. In no apparent acute distress Vitals:   07/13/18 1102  BP: 122/79  Pulse: 87  Temp: 98.9 F (37.2 C)  SpO2: 97%  Weight: 232 lb (105.2 kg)  Height: _0  (1.778 m)  Psych/Mental status: Alert, oriented x 3 (person, place, & time)       Eyes: PERLA Respiratory: No evidence of acute respiratory distress   Gait & Posture Assessment  Ambulation: Unassisted Gait: Relatively normal for age and body habitus Posture: WNL   Lower Extremity Exam    Side: Right lower extremity  Side: Left lower extremity  Stability: No instability observed          Stability: No instability observed          Skin & Extremity Inspection: Evidence of prior arthroplastic surgery  Skin & Extremity Inspection: Evidence of prior arthroplastic surgery  Functional ROM: Adequate ROM                  Functional ROM: Decreased ROM                  Muscle Tone/Strength: Functionally intact. No obvious neuro-muscular anomalies detected.  Muscle Tone/Strength: Functionally intact. No obvious neuro-muscular anomalies detected.  Sensory (Neurological): Unimpaired  Sensory (Neurological): Unimpaired  Palpation: No palpable anomalies  Palpation: No palpable anomalies   Assessment  Primary Diagnosis &  Pertinent Problem List: The primary encounter diagnosis was Chronic knee pain (Primary Area of Pain) (Bilateral) (L>R). Diagnoses of Osteoarthritis of the knee (Left), Chronic wrist pain (Secondary Area of Pain) (Left), and Chronic pain syndrome were also pertinent to this visit.  Status Diagnosis  Persistent Persistent Persistent 1. Chronic knee pain (Primary Area of Pain) (Bilateral) (L>R)   2. Osteoarthritis of the knee (Left)   3. Chronic wrist pain (Secondary Area of Pain) (Left)   4. Chronic pain syndrome     Problems updated and reviewed during this visit: No problems updated. Plan of Care  Pharmacotherapy (Medications Ordered): Meds ordered this encounter  Medications  . tapentadol (NUCYNTA) 50 MG tablet    Sig: Take 1 tablet (50 mg total) by mouth every 8 (eight) hours as needed for severe pain. Swallow tablets whole. Do not chew, crush or dissolve.    Dispense:  90 tablet  Refill:  0    Do not place this medication on "Automatic Refill". Patient may have prescription filled one day early if pharmacy is closed on scheduled refill date. Do not fill until: 07/17/2018 To last until: 08/16/2018    Order Specific Question:   Supervising Provider    Answer:   Milinda Pointer [phone]   New Prescriptions   No medications on file   Medications administered today: Arn Medal "[place]" had no medications administered during this visit. Lab-work, procedure(s), and/or referral(s): No orders of the defined types were placed in this encounter.  Imaging and/or referral(s): None  Interventional therapies: Planned, scheduled, and/or pending: Not at this time   Considering: Diagnostic Left intra-articular knee injection Diagnostic Left genicular nerve block Possible Left genicular RFA Diagnostic Left Hyalgan series   Palliative PRN treatment(s): Not at this time.    Provider-requested follow-up: Return in about 5 weeks (around 08/17/2018) for MedMgmt with Me  Donella Stade Edison Pace).  Future Appointments  Date Time Provider [place]  07/23/2018 11:15 AM Debara Pickett, PT [facility] None  08/13/2018 12:45 PM Vevelyn Francois, NP [facility] None  05/21/2019  9:00 AM [facility]  05/28/2019 11:00 AM Eustace Pen, LPN [facility]  08/18/9149 11:20 AM Pleas Koch, NP [facility]   Primary Care Physician: Pleas Koch, NP Location: [facility] Note by: Vevelyn Francois NP Date: 07/13/2018; Time: 3:51 PM  Pain Score Disclaimer: We use the NRS-11 scale. This is a self-reported, subjective measurement of pain severity with only modest accuracy. It is used primarily to identify changes within a particular patient. It must be understood that outpatient pain scales are significantly less accurate that those used for research, where they can be applied under ideal controlled circumstances with minimal exposure to variables. In reality, the score is likely to be a combination of pain intensity and pain affect, where pain affect describes the degree of emotional arousal or changes in action readiness caused by the sensory experience of pain. Factors such as social and work situation, setting, emotional state, anxiety levels, expectation, and prior pain experience may influence pain perception and show large inter-individual differences that may also be affected by time variables.  Patient instructions provided during this appointment: Patient Instructions   ____________________________________________________________________________________________  Medication Rules  Applies to: All patients receiving prescriptions (written or electronic).  Pharmacy of record: Pharmacy where electronic prescriptions will be sent. If written prescriptions are taken to a different pharmacy, please inform the nursing staff. The pharmacy listed in the electronic medical record should be the one where you would like electronic  prescriptions to be sent.  Prescription refills: Only during scheduled appointments. Applies to both, written and electronic prescriptions.  NOTE: The following applies primarily to controlled substances (Opioid* Pain Medications).   Patient's responsibilities: 1. Pain Pills: Bring all pain pills to every appointment (except for procedure appointments). 2. Pill Bottles: Bring pills in original pharmacy bottle. Always bring newest bottle. Bring bottle, even if empty. 3. Medication refills: You are responsible for knowing and keeping track of what medications you need refilled. The day before your appointment, write a list of all prescriptions that need to be refilled. Bring that list to your appointment and give it to the admitting nurse. Prescriptions will be written only during appointments. If you forget a medication, it will not be "Called in", "Faxed", or "electronically sent". You will need to get another appointment to get these prescribed. 4. Prescription Accuracy: You are  responsible for carefully inspecting your prescriptions before leaving our office. Have the discharge nurse carefully go over each prescription with you, before taking them home. Make sure that your name is accurately spelled, that your address is correct. Check the name and dose of your medication to make sure it is accurate. Check the number of pills, and the written instructions to make sure they are clear and accurate. Make sure that you are given enough medication to last until your next medication refill appointment. 5. Taking Medication: Take medication as prescribed. Never take more pills than instructed. Never take medication more frequently than prescribed. Taking less pills or less frequently is permitted and encouraged, when it comes to controlled substances (written prescriptions).  6. Inform other Doctors: Always inform, all of your healthcare providers, of all the medications you take. 7. Pain Medication from  other Providers: You are not allowed to accept any additional pain medication from any other Doctor or Healthcare provider. There are two exceptions to this rule. (see below) In the event that you require additional pain medication, you are responsible for notifying us, as stated below. 8. Medication Agreement: You are responsible for carefully reading and following our Medication Agreement. This must be signed before receiving any prescriptions from our practice. Safely store a copy of your signed Agreement. Violations to the Agreement will result in no further prescriptions. (Additional copies of our Medication Agreement are available upon request.) 9. Laws, Rules, & Regulations: All patients are expected to follow all Federal and [employer], [employer], Rules, [employer]. Ignorance of the Laws does not constitute a valid excuse. The use of any illegal substances is prohibited. 10. Adopted CDC guidelines & recommendations: Target dosing levels will be at or below 60 MME/day. Use of benzodiazepines** is not recommended.  Exceptions: There are only two exceptions to the rule of not receiving pain medications from other Healthcare Providers. 1. Exception #1 (Emergencies): In the event of an emergency (i.e.: accident requiring emergency care), you are allowed to receive additional pain medication. However, you are responsible for: As soon as you are able, call our office [phone], at any time of the day or night, and leave a message stating your name, the date and nature of the emergency, and the name and dose of the medication prescribed. In the event that your call is answered by a member of our staff, make sure to document and save the date, time, and the name of the person that took your information.  2. Exception #2 (Planned Surgery): In the event that you are scheduled by another doctor or dentist to have any type of surgery or procedure, you are allowed (for a period no longer than 30 days), to  receive additional pain medication, for the acute post-op pain. However, in this case, you are responsible for picking up a copy of our "Post-op Pain Management for Surgeons" handout, and giving it to your surgeon or dentist. This document is available at our office, and does not require an appointment to obtain it. Simply go to our office during business hours (Monday-Thursday from 8:00 AM to 4:00 PM) (Friday 8:00 AM to 12:00 Noon) or if you have a scheduled appointment with [place], prior to your surgery, and ask for it by name. In addition, you will need to provide [place] with your name, name of your surgeon, type of surgery, and date of procedure or surgery.  *Opioid medications include: morphine, codeine, oxycodone, oxymorphone, hydrocodone, hydromorphone, meperidine, tramadol, tapentadol, buprenorphine, fentanyl, methadone. **Benzodiazepine  medications include: diazepam (Valium), alprazolam (Xanax), clonazepam (Klonopine), lorazepam (Ativan), clorazepate (Tranxene), chlordiazepoxide (Librium), estazolam (Prosom), oxazepam (Serax), temazepam (Restoril), triazolam (Halcion) (Last updated: 01/29/2018) ____________________________________________________________________________________________    BMI Assessment: Estimated body mass index is 33.29 kg/m as calculated from the following:   Height as of this encounter: _0  (1.778 m).   Weight as of this encounter: 232 lb (105.2 kg).  BMI interpretation table: BMI level Category Range association with higher incidence of chronic pain  <18 kg/m2 Underweight   18.5-24.9 kg/m2 Ideal body weight   25-29.9 kg/m2 Overweight Increased incidence by 20%  30-34.9 kg/m2 Obese (Class I) Increased incidence by 68%  35-39.9 kg/m2 Severe obesity (Class II) Increased incidence by 136%  >40 kg/m2 Extreme obesity (Class III) Increased incidence by 254%   Patient's current BMI Ideal Body weight  Body mass index is 33.29 kg/m. Ideal body weight: 73 kg (160 lb 15  oz) Adjusted ideal body weight: 85.9 kg (189 lb 5.8 oz)   BMI Readings from Last 4 Encounters:  07/13/18 33.29 kg/m  06/01/18 34.58 kg/m  05/21/18 35.33 kg/m  05/20/18 34.71 kg/m   Wt Readings from Last 4 Encounters:  07/13/18 232 lb (105.2 kg)  06/01/18 241 lb (109.3 kg)  05/21/18 241 lb (109.3 kg)  05/20/18 236 lb 12 oz (107.4 kg)

## 2018-07-14 NOTE — Progress Notes (Signed)
Pain management clearance dr Kizzie Furnishneverna 05-27-18 on chart Medical and cardiac clearance 10-21-17 on chart

## 2018-07-20 MED ORDER — BUPIVACAINE LIPOSOME 1.3 % IJ SUSP
20.0000 mL | Freq: Once | INTRAMUSCULAR | Status: DC
  Filled 2018-07-20: qty 20

## 2018-07-20 NOTE — Anesthesia Preprocedure Evaluation (Addendum)
Anesthesia Evaluation  Patient identified by MRN, date of birth, ID band Patient awake    Reviewed: Allergy & Precautions, NPO status   Airway Mallampati: II  TM Distance: >3 FB     Dental   Pulmonary shortness of breath, asthma , sleep apnea , COPD, Current Smoker,    breath sounds clear to auscultation       Cardiovascular negative cardio ROS   Rhythm:Regular Rate:Normal     Neuro/Psych    GI/Hepatic GERD  ,  Endo/Other  diabetes  Renal/GU      Musculoskeletal   Abdominal   Peds  Hematology   Anesthesia Other Findings   Reproductive/Obstetrics                            Anesthesia Physical Anesthesia Plan  ASA: III  Anesthesia Plan: Spinal   Post-op Pain Management:    Induction: Intravenous  PONV Risk Score and Plan: Dexamethasone, Ondansetron and Midazolam  Airway Management Planned: Nasal Cannula and Simple Face Mask  Additional Equipment:   Intra-op Plan:   Post-operative Plan:   Informed Consent:   Dental advisory given  Plan Discussed with: Anesthesiologist and CRNA  Anesthesia Plan Comments:        Anesthesia Quick Evaluation

## 2018-07-20 NOTE — Progress Notes (Signed)
LEFT PATIENT VOICE MAIL MESSAGE ARRIVE 530AM 07-30-18  ADMITTING. FOLLOW ALL OTHER PRE OP INSTRUCTIONS

## 2018-07-20 NOTE — Progress Notes (Signed)
LEFT VOICE MAIL MESSAGE AGAIN TO ARRIVE 530 AM [place] ADMITTING CALL SHORT STAY AT [phone] WITH ANY QUESTIONS

## 2018-07-21 ENCOUNTER — Inpatient Hospital Stay (HOSPITAL_COMMUNITY): Payer: Medicare Other

## 2018-07-21 ENCOUNTER — Observation Stay (HOSPITAL_COMMUNITY)
Admission: RE | Admit: 2018-07-21 | Discharge: 2018-07-22 | Disposition: A | Discharge status: Home / Self Care | Payer: Medicare Other | Source: Ambulatory Visit | Attending: Orthopedic Surgery | Admitting: Orthopedic Surgery

## 2018-07-21 ENCOUNTER — Encounter (HOSPITAL_COMMUNITY): Payer: Self-pay

## 2018-07-21 ENCOUNTER — Other Ambulatory Visit: Payer: Self-pay

## 2018-07-21 ENCOUNTER — Encounter (HOSPITAL_COMMUNITY)
Admission: RE | Disposition: A | Discharge status: Home / Self Care | Payer: Self-pay | Source: Ambulatory Visit | Attending: Orthopedic Surgery

## 2018-07-21 ENCOUNTER — Inpatient Hospital Stay (HOSPITAL_COMMUNITY): Payer: Medicare Other | Admitting: Anesthesiology

## 2018-07-21 DIAGNOSIS — F17210 Nicotine dependence, cigarettes, uncomplicated: Secondary | ICD-10-CM | POA: Diagnosis not present

## 2018-07-21 DIAGNOSIS — Z96652 Presence of left artificial knee joint: Secondary | ICD-10-CM

## 2018-07-21 DIAGNOSIS — Z9852 Vasectomy status: Secondary | ICD-10-CM | POA: Insufficient documentation

## 2018-07-21 DIAGNOSIS — Z881 Allergy status to other antibiotic agents status: Secondary | ICD-10-CM | POA: Insufficient documentation

## 2018-07-21 DIAGNOSIS — M1712 Unilateral primary osteoarthritis, left knee: Secondary | ICD-10-CM | POA: Diagnosis not present

## 2018-07-21 DIAGNOSIS — Z79899 Other long term (current) drug therapy: Secondary | ICD-10-CM | POA: Insufficient documentation

## 2018-07-21 DIAGNOSIS — Z888 Allergy status to other drugs, medicaments and biological substances status: Secondary | ICD-10-CM | POA: Diagnosis not present

## 2018-07-21 DIAGNOSIS — E119 Type 2 diabetes mellitus without complications: Secondary | ICD-10-CM | POA: Diagnosis not present

## 2018-07-21 DIAGNOSIS — G8918 Other acute postprocedural pain: Secondary | ICD-10-CM | POA: Diagnosis not present

## 2018-07-21 DIAGNOSIS — Z7984 Long term (current) use of oral hypoglycemic drugs: Secondary | ICD-10-CM | POA: Diagnosis not present

## 2018-07-21 DIAGNOSIS — Z7951 Long term (current) use of inhaled steroids: Secondary | ICD-10-CM | POA: Insufficient documentation

## 2018-07-21 DIAGNOSIS — F329 Major depressive disorder, single episode, unspecified: Secondary | ICD-10-CM | POA: Diagnosis not present

## 2018-07-21 DIAGNOSIS — F419 Anxiety disorder, unspecified: Secondary | ICD-10-CM | POA: Diagnosis not present

## 2018-07-21 DIAGNOSIS — Z9841 Cataract extraction status, right eye: Secondary | ICD-10-CM | POA: Diagnosis not present

## 2018-07-21 DIAGNOSIS — Z961 Presence of intraocular lens: Secondary | ICD-10-CM | POA: Diagnosis not present

## 2018-07-21 DIAGNOSIS — G4733 Obstructive sleep apnea (adult) (pediatric): Secondary | ICD-10-CM | POA: Diagnosis not present

## 2018-07-21 DIAGNOSIS — G894 Chronic pain syndrome: Secondary | ICD-10-CM | POA: Diagnosis not present

## 2018-07-21 DIAGNOSIS — T402X5A Adverse effect of other opioids, initial encounter: Secondary | ICD-10-CM

## 2018-07-21 DIAGNOSIS — K219 Gastro-esophageal reflux disease without esophagitis: Secondary | ICD-10-CM | POA: Diagnosis present

## 2018-07-21 DIAGNOSIS — K5903 Drug induced constipation: Secondary | ICD-10-CM | POA: Diagnosis not present

## 2018-07-21 DIAGNOSIS — J449 Chronic obstructive pulmonary disease, unspecified: Secondary | ICD-10-CM | POA: Diagnosis present

## 2018-07-21 DIAGNOSIS — Z79891 Long term (current) use of opiate analgesic: Secondary | ICD-10-CM | POA: Insufficient documentation

## 2018-07-21 DIAGNOSIS — M1710 Unilateral primary osteoarthritis, unspecified knee: Secondary | ICD-10-CM | POA: Diagnosis present

## 2018-07-21 DIAGNOSIS — Z471 Aftercare following joint replacement surgery: Secondary | ICD-10-CM | POA: Diagnosis not present

## 2018-07-21 DIAGNOSIS — E1165 Type 2 diabetes mellitus with hyperglycemia: Secondary | ICD-10-CM

## 2018-07-21 DIAGNOSIS — F1190 Opioid use, unspecified, uncomplicated: Secondary | ICD-10-CM | POA: Diagnosis present

## 2018-07-21 DIAGNOSIS — Z96659 Presence of unspecified artificial knee joint: Secondary | ICD-10-CM

## 2018-07-21 HISTORY — DX: Presence of left artificial knee joint: Z96.652

## 2018-07-21 HISTORY — PX: PARTIAL KNEE ARTHROPLASTY: SHX2174

## 2018-07-21 LAB — GLUCOSE, CAPILLARY
GLUCOSE-CAPILLARY: 200 mg/dL — AB (ref 70–99)
Glucose-Capillary: 189 mg/dL — ABNORMAL HIGH (ref 70–99)

## 2018-07-21 SURGERY — ARTHROPLASTY, KNEE, UNICOMPARTMENTAL
Anesthesia: Spinal | Site: Knee | Laterality: Left

## 2018-07-21 MED ORDER — IPRATROPIUM BROMIDE 0.02 % IN SOLN
RESPIRATORY_TRACT | Status: AC
  Administered 2018-07-21: 0.5 mg
  Filled 2018-07-21: qty 2.5

## 2018-07-21 MED ORDER — GABAPENTIN 300 MG PO CAPS
300.0000 mg | ORAL_CAPSULE | Freq: Three times a day (TID) | ORAL | Status: DC
  Administered 2018-07-21 – 2018-07-22 (×3): 300 mg via ORAL
  Filled 2018-07-21 (×3): qty 1

## 2018-07-21 MED ORDER — CEFAZOLIN SODIUM-DEXTROSE 2-4 GM/100ML-% IV SOLN
2.0000 g | Freq: Four times a day (QID) | INTRAVENOUS | Status: AC
  Administered 2018-07-21 (×2): 2 g via INTRAVENOUS
  Filled 2018-07-21 (×2): qty 100

## 2018-07-21 MED ORDER — ACETAMINOPHEN 500 MG PO TABS
1000.0000 mg | ORAL_TABLET | Freq: Three times a day (TID) | ORAL | Status: DC
  Administered 2018-07-21 – 2018-07-22 (×3): 1000 mg via ORAL
  Filled 2018-07-21 (×3): qty 2

## 2018-07-21 MED ORDER — MIDAZOLAM HCL 2 MG/2ML IJ SOLN
INTRAMUSCULAR | Status: AC
  Filled 2018-07-21: qty 2

## 2018-07-21 MED ORDER — LACTATED RINGERS IV SOLN: INTRAVENOUS | Status: DC

## 2018-07-21 MED ORDER — TRAZODONE HCL 50 MG PO TABS
150.0000 mg | ORAL_TABLET | Freq: Every evening | ORAL | Status: DC | PRN
  Administered 2018-07-21: 150 mg via ORAL
  Filled 2018-07-21: qty 1

## 2018-07-21 MED ORDER — PROPOFOL 10 MG/ML IV BOLUS
INTRAVENOUS | Status: AC
  Filled 2018-07-21: qty 20

## 2018-07-21 MED ORDER — POLYETHYLENE GLYCOL 3350 17 G PO PACK: 17.0000 g | PACK | Freq: Every day | ORAL | Status: DC | PRN

## 2018-07-21 MED ORDER — DOCUSATE SODIUM 100 MG PO CAPS: 100.0000 mg | ORAL_CAPSULE | Freq: Two times a day (BID) | ORAL | 0 refills | Status: DC

## 2018-07-21 MED ORDER — PROPOFOL 10 MG/ML IV BOLUS
INTRAVENOUS | Status: DC | PRN
  Administered 2018-07-21 (×2): 20 mg via INTRAVENOUS
  Administered 2018-07-21: 40 mg via INTRAVENOUS

## 2018-07-21 MED ORDER — ONDANSETRON HCL 4 MG PO TABS: 4.0000 mg | ORAL_TABLET | Freq: Three times a day (TID) | ORAL | 0 refills | Status: DC | PRN

## 2018-07-21 MED ORDER — ASPIRIN EC 81 MG PO TBEC: 81.0000 mg | DELAYED_RELEASE_TABLET | Freq: Two times a day (BID) | ORAL | 0 refills | Status: DC

## 2018-07-21 MED ORDER — ASPIRIN 81 MG PO CHEW
81.0000 mg | CHEWABLE_TABLET | Freq: Two times a day (BID) | ORAL | Status: DC
  Administered 2018-07-21 – 2018-07-22 (×2): 81 mg via ORAL
  Filled 2018-07-21 (×2): qty 1

## 2018-07-21 MED ORDER — MIDAZOLAM HCL 5 MG/5ML IJ SOLN
INTRAMUSCULAR | Status: DC | PRN
  Administered 2018-07-21: 2 mg via INTRAVENOUS

## 2018-07-21 MED ORDER — METHOCARBAMOL 500 MG PO TABS: 500.0000 mg | ORAL_TABLET | Freq: Four times a day (QID) | ORAL | 0 refills | Status: DC | PRN

## 2018-07-21 MED ORDER — CELECOXIB 200 MG PO CAPS
200.0000 mg | ORAL_CAPSULE | Freq: Two times a day (BID) | ORAL | Status: DC
  Administered 2018-07-21 – 2018-07-22 (×2): 200 mg via ORAL
  Filled 2018-07-21 (×2): qty 1

## 2018-07-21 MED ORDER — PROPOFOL 10 MG/ML IV BOLUS
INTRAVENOUS | Status: AC
  Filled 2018-07-21: qty 60

## 2018-07-21 MED ORDER — SODIUM CHLORIDE 0.9 % IJ SOLN
INTRAMUSCULAR | Status: AC
  Filled 2018-07-21: qty 50

## 2018-07-21 MED ORDER — IPRATROPIUM-ALBUTEROL 0.5-2.5 (3) MG/3ML IN SOLN
3.0000 mL | Freq: Two times a day (BID) | RESPIRATORY_TRACT | Status: DC
  Administered 2018-07-21 – 2018-07-22 (×2): 3 mL via RESPIRATORY_TRACT
  Filled 2018-07-21 (×2): qty 3

## 2018-07-21 MED ORDER — CANAGLIFLOZIN 100 MG PO TABS
100.0000 mg | ORAL_TABLET | Freq: Every day | ORAL | Status: DC
  Administered 2018-07-22: 100 mg via ORAL
  Filled 2018-07-21: qty 1

## 2018-07-21 MED ORDER — PHENOL 1.4 % MT LIQD
1.0000 | OROMUCOSAL | Status: DC | PRN
  Filled 2018-07-21: qty 177

## 2018-07-21 MED ORDER — ONDANSETRON HCL 4 MG/2ML IJ SOLN: 4.0000 mg | Freq: Four times a day (QID) | INTRAMUSCULAR | Status: DC | PRN

## 2018-07-21 MED ORDER — FENTANYL CITRATE (PF) 100 MCG/2ML IJ SOLN: 25.0000 ug | INTRAMUSCULAR | Status: DC | PRN

## 2018-07-21 MED ORDER — LACTATED RINGERS IV SOLN
INTRAVENOUS | Status: DC
  Administered 2018-07-21 (×2): via INTRAVENOUS

## 2018-07-21 MED ORDER — ALBUTEROL SULFATE (2.5 MG/3ML) 0.083% IN NEBU: 2.5000 mg | INHALATION_SOLUTION | Freq: Four times a day (QID) | RESPIRATORY_TRACT | Status: DC | PRN

## 2018-07-21 MED ORDER — CHLORHEXIDINE GLUCONATE 4 % EX LIQD: 60.0000 mL | Freq: Once | CUTANEOUS | Status: DC

## 2018-07-21 MED ORDER — METOCLOPRAMIDE HCL 5 MG PO TABS: 5.0000 mg | ORAL_TABLET | Freq: Three times a day (TID) | ORAL | Status: DC | PRN

## 2018-07-21 MED ORDER — METOCLOPRAMIDE HCL 5 MG/ML IJ SOLN: 5.0000 mg | Freq: Three times a day (TID) | INTRAMUSCULAR | Status: DC | PRN

## 2018-07-21 MED ORDER — PANTOPRAZOLE SODIUM 40 MG PO TBEC
40.0000 mg | DELAYED_RELEASE_TABLET | Freq: Every day | ORAL | Status: DC
  Administered 2018-07-22: 40 mg via ORAL
  Filled 2018-07-21: qty 1

## 2018-07-21 MED ORDER — OXYCODONE HCL 5 MG PO TABS: 5.0000 mg | ORAL_TABLET | ORAL | 0 refills | Status: DC | PRN

## 2018-07-21 MED ORDER — ONDANSETRON HCL 4 MG/2ML IJ SOLN
INTRAMUSCULAR | Status: DC | PRN
  Administered 2018-07-21: 4 mg via INTRAVENOUS

## 2018-07-21 MED ORDER — ACETAMINOPHEN 500 MG PO TABS: 1000.0000 mg | ORAL_TABLET | Freq: Three times a day (TID) | ORAL | 0 refills | Status: AC

## 2018-07-21 MED ORDER — GABAPENTIN 300 MG PO CAPS: 300.0000 mg | ORAL_CAPSULE | Freq: Three times a day (TID) | ORAL | 0 refills | Status: DC

## 2018-07-21 MED ORDER — CEFAZOLIN SODIUM-DEXTROSE 2-4 GM/100ML-% IV SOLN
2.0000 g | INTRAVENOUS | Status: AC
  Administered 2018-07-21: 2 g via INTRAVENOUS
  Filled 2018-07-21: qty 100

## 2018-07-21 MED ORDER — ALBUTEROL SULFATE (2.5 MG/3ML) 0.083% IN NEBU
INHALATION_SOLUTION | RESPIRATORY_TRACT | Status: AC
  Administered 2018-07-21: 2.5 mg
  Filled 2018-07-21: qty 3

## 2018-07-21 MED ORDER — ACETAMINOPHEN 500 MG PO TABS
1000.0000 mg | ORAL_TABLET | Freq: Once | ORAL | Status: AC
  Administered 2018-07-21: 1000 mg via ORAL
  Filled 2018-07-21: qty 2

## 2018-07-21 MED ORDER — HYDROMORPHONE HCL 1 MG/ML IJ SOLN
0.5000 mg | INTRAMUSCULAR | Status: DC | PRN
  Administered 2018-07-21: 1 mg via INTRAVENOUS
  Administered 2018-07-21: 0.5 mg via INTRAVENOUS
  Filled 2018-07-21 (×2): qty 1

## 2018-07-21 MED ORDER — GLIPIZIDE 5 MG PO TABS
5.0000 mg | ORAL_TABLET | Freq: Two times a day (BID) | ORAL | Status: DC
  Administered 2018-07-21 – 2018-07-22 (×2): 5 mg via ORAL
  Filled 2018-07-21 (×2): qty 1

## 2018-07-21 MED ORDER — OXYCODONE HCL 5 MG PO TABS
5.0000 mg | ORAL_TABLET | ORAL | Status: DC | PRN
  Administered 2018-07-21: 10 mg via ORAL
  Administered 2018-07-21: 5 mg via ORAL
  Administered 2018-07-22 (×2): 10 mg via ORAL
  Filled 2018-07-21: qty 1
  Filled 2018-07-21 (×3): qty 2

## 2018-07-21 MED ORDER — SORBITOL 70 % SOLN
30.0000 mL | Freq: Every day | Status: DC | PRN
  Filled 2018-07-21: qty 30

## 2018-07-21 MED ORDER — BUPIVACAINE IN DEXTROSE 0.75-8.25 % IT SOLN
INTRATHECAL | Status: DC | PRN
  Administered 2018-07-21: 2 mL via INTRATHECAL

## 2018-07-21 MED ORDER — GABAPENTIN 300 MG PO CAPS
300.0000 mg | ORAL_CAPSULE | Freq: Once | ORAL | Status: AC
  Administered 2018-07-21: 300 mg via ORAL
  Filled 2018-07-21: qty 1

## 2018-07-21 MED ORDER — SODIUM CHLORIDE FLUSH 0.9 % IV SOLN
INTRAVENOUS | Status: DC | PRN
  Administered 2018-07-21: 30 mL

## 2018-07-21 MED ORDER — OXYCODONE HCL 5 MG PO TABS: 5.0000 mg | ORAL_TABLET | ORAL | 0 refills | Status: AC | PRN

## 2018-07-21 MED ORDER — ONDANSETRON HCL 4 MG PO TABS: 4.0000 mg | ORAL_TABLET | Freq: Four times a day (QID) | ORAL | Status: DC | PRN

## 2018-07-21 MED ORDER — GABAPENTIN 300 MG PO CAPS: 300.0000 mg | ORAL_CAPSULE | Freq: Two times a day (BID) | ORAL | 0 refills | Status: DC

## 2018-07-21 MED ORDER — MAGNESIUM CITRATE PO SOLN: 1.0000 | Freq: Once | ORAL | Status: DC | PRN

## 2018-07-21 MED ORDER — PHENYLEPHRINE 40 MCG/ML (10ML) SYRINGE FOR IV PUSH (FOR BLOOD PRESSURE SUPPORT)
PREFILLED_SYRINGE | INTRAVENOUS | Status: AC
  Filled 2018-07-21: qty 10

## 2018-07-21 MED ORDER — CELECOXIB 200 MG PO CAPS: 200.0000 mg | ORAL_CAPSULE | Freq: Two times a day (BID) | ORAL | 0 refills | Status: DC

## 2018-07-21 MED ORDER — MENTHOL 3 MG MT LOZG
1.0000 | LOZENGE | OROMUCOSAL | Status: DC | PRN
  Administered 2018-07-21: 3 mg via ORAL
  Filled 2018-07-21: qty 9

## 2018-07-21 MED ORDER — FENTANYL CITRATE (PF) 100 MCG/2ML IJ SOLN
INTRAMUSCULAR | Status: DC | PRN
  Administered 2018-07-21 (×2): 50 ug via INTRAVENOUS

## 2018-07-21 MED ORDER — PROPOFOL 500 MG/50ML IV EMUL
INTRAVENOUS | Status: DC | PRN
  Administered 2018-07-21: 100 ug/kg/min via INTRAVENOUS

## 2018-07-21 MED ORDER — DIPHENHYDRAMINE HCL 12.5 MG/5ML PO ELIX: 12.5000 mg | ORAL_SOLUTION | ORAL | Status: DC | PRN

## 2018-07-21 MED ORDER — POVIDONE-IODINE 10 % EX SWAB: 2.0000 "application " | Freq: Once | CUTANEOUS | Status: DC

## 2018-07-21 MED ORDER — DEXAMETHASONE SODIUM PHOSPHATE 10 MG/ML IJ SOLN
10.0000 mg | Freq: Once | INTRAMUSCULAR | Status: AC
  Administered 2018-07-22: 10 mg via INTRAVENOUS
  Filled 2018-07-21: qty 1

## 2018-07-21 MED ORDER — FENTANYL CITRATE (PF) 100 MCG/2ML IJ SOLN
INTRAMUSCULAR | Status: AC
  Filled 2018-07-21: qty 2

## 2018-07-21 MED ORDER — TAMSULOSIN HCL 0.4 MG PO CAPS
0.4000 mg | ORAL_CAPSULE | Freq: Every day | ORAL | Status: DC
  Administered 2018-07-22: 0.4 mg via ORAL
  Filled 2018-07-21: qty 1

## 2018-07-21 MED ORDER — BUPIVACAINE LIPOSOME 1.3 % IJ SUSP
INTRAMUSCULAR | Status: DC | PRN
  Administered 2018-07-21: 20 mL

## 2018-07-21 MED ORDER — CITALOPRAM HYDROBROMIDE 40 MG PO TABS
40.0000 mg | ORAL_TABLET | Freq: Every day | ORAL | Status: DC
  Administered 2018-07-21 – 2018-07-22 (×2): 40 mg via ORAL
  Filled 2018-07-21: qty 2
  Filled 2018-07-21 (×2): qty 1

## 2018-07-21 MED ORDER — FLUTICASONE FUROATE-VILANTEROL 200-25 MCG/INH IN AEPB
1.0000 | INHALATION_SPRAY | Freq: Every day | RESPIRATORY_TRACT | Status: DC
  Administered 2018-07-22: 1 via RESPIRATORY_TRACT
  Filled 2018-07-21: qty 28

## 2018-07-21 MED ORDER — METHOCARBAMOL 500 MG IVPB - SIMPLE MED
500.0000 mg | Freq: Four times a day (QID) | INTRAVENOUS | Status: DC | PRN
  Filled 2018-07-21: qty 50

## 2018-07-21 MED ORDER — METHOCARBAMOL 500 MG PO TABS
500.0000 mg | ORAL_TABLET | Freq: Four times a day (QID) | ORAL | Status: DC | PRN
  Administered 2018-07-21 – 2018-07-22 (×3): 500 mg via ORAL
  Filled 2018-07-21 (×3): qty 1

## 2018-07-21 MED ORDER — LACTATED RINGERS IV SOLN
INTRAVENOUS | Status: DC
  Administered 2018-07-21: 09:00:00 via INTRAVENOUS
  Administered 2018-07-21: 1000 mL via INTRAVENOUS

## 2018-07-21 MED ORDER — BUPROPION HCL ER (SR) 150 MG PO TB12
150.0000 mg | ORAL_TABLET | Freq: Two times a day (BID) | ORAL | Status: DC
  Administered 2018-07-22: 150 mg via ORAL
  Filled 2018-07-21: qty 1

## 2018-07-21 MED ORDER — ONDANSETRON HCL 4 MG/2ML IJ SOLN
INTRAMUSCULAR | Status: AC
  Filled 2018-07-21: qty 2

## 2018-07-21 MED ORDER — TAPENTADOL HCL 50 MG PO TABS
50.0000 mg | ORAL_TABLET | Freq: Three times a day (TID) | ORAL | Status: DC | PRN
  Administered 2018-07-21 – 2018-07-22 (×3): 50 mg via ORAL
  Filled 2018-07-21 (×3): qty 1

## 2018-07-21 MED ORDER — 0.9 % SODIUM CHLORIDE (POUR BTL) OPTIME
TOPICAL | Status: DC | PRN
  Administered 2018-07-21: 1000 mL

## 2018-07-21 MED ORDER — ACETAMINOPHEN 325 MG PO TABS: 325.0000 mg | ORAL_TABLET | Freq: Four times a day (QID) | ORAL | Status: DC | PRN

## 2018-07-21 MED ORDER — METHOCARBAMOL 500 MG PO TABS: 500.0000 mg | ORAL_TABLET | Freq: Three times a day (TID) | ORAL | 0 refills | Status: DC | PRN

## 2018-07-21 MED ORDER — IPRATROPIUM BROMIDE 0.02 % IN SOLN: 0.5000 mg | Freq: Every day | RESPIRATORY_TRACT | Status: DC | PRN

## 2018-07-21 MED ORDER — DIPHENHYDRAMINE HCL 25 MG PO CAPS
25.0000 mg | ORAL_CAPSULE | Freq: Two times a day (BID) | ORAL | Status: DC
  Administered 2018-07-21: 25 mg via ORAL
  Filled 2018-07-21: qty 1

## 2018-07-21 MED ORDER — METFORMIN HCL ER 500 MG PO TB24
1000.0000 mg | ORAL_TABLET | Freq: Every day | ORAL | Status: DC
  Administered 2018-07-22: 1000 mg via ORAL
  Filled 2018-07-21: qty 2

## 2018-07-21 MED ORDER — CELECOXIB 200 MG PO CAPS: 200.0000 mg | ORAL_CAPSULE | Freq: Two times a day (BID) | ORAL | 0 refills | Status: AC

## 2018-07-21 MED ORDER — DOCUSATE SODIUM 100 MG PO CAPS
100.0000 mg | ORAL_CAPSULE | Freq: Two times a day (BID) | ORAL | Status: DC
  Administered 2018-07-21 – 2018-07-22 (×2): 100 mg via ORAL
  Filled 2018-07-21 (×2): qty 1

## 2018-07-21 MED ORDER — SODIUM CHLORIDE 0.9 % IR SOLN
Status: DC | PRN
  Administered 2018-07-21: 1000 mL

## 2018-07-21 MED ORDER — ACETAMINOPHEN 500 MG PO TABS: 1000.0000 mg | ORAL_TABLET | Freq: Three times a day (TID) | ORAL | 0 refills | Status: DC

## 2018-07-21 MED ORDER — TRANEXAMIC ACID 1000 MG/10ML IV SOLN
1000.0000 mg | INTRAVENOUS | Status: AC
  Administered 2018-07-21: 1000 mg via INTRAVENOUS
  Filled 2018-07-21: qty 10

## 2018-07-21 MED ORDER — ATORVASTATIN CALCIUM 20 MG PO TABS
20.0000 mg | ORAL_TABLET | Freq: Every day | ORAL | Status: DC
  Administered 2018-07-21: 20 mg via ORAL
  Filled 2018-07-21: qty 1

## 2018-07-21 MED ORDER — PHENYLEPHRINE 40 MCG/ML (10ML) SYRINGE FOR IV PUSH (FOR BLOOD PRESSURE SUPPORT)
PREFILLED_SYRINGE | INTRAVENOUS | Status: DC | PRN
  Administered 2018-07-21 (×2): 40 ug via INTRAVENOUS

## 2018-07-21 SURGICAL SUPPLY — 49 items
BEARING TIBIAL OXFORD MED 4 (Orthopedic Implant) ×1 IMPLANT
BEARING TIBIAL OXFORD MED 4MM (Orthopedic Implant) ×1 IMPLANT
BIT DRILL QUICK REL 1/8 2PK SL (DRILL) IMPLANT
BLADE SAW RECIPROCATING 77.5 (BLADE) IMPLANT
BLADE SAW SAG 90X13X1.27 (BLADE) IMPLANT
BNDG CMPR MED 10X6 ELC LF (GAUZE/BANDAGES/DRESSINGS) ×1
BNDG ELASTIC 6X10 VLCR STRL LF (GAUZE/BANDAGES/DRESSINGS) ×2 IMPLANT
BOWL SMART MIX CTS (DISPOSABLE) ×3 IMPLANT
BRNG TIB MED 4 PHS 3 LT MEN (Orthopedic Implant) ×1 IMPLANT
CEMENT BONE R 1X40 (Cement) ×2 IMPLANT
CHLORAPREP W/TINT 26ML (MISCELLANEOUS) ×3 IMPLANT
CLOSURE WOUND 1/2 X4 (GAUZE/BANDAGES/DRESSINGS) ×1
COVER SURGICAL LIGHT HANDLE (MISCELLANEOUS) ×3 IMPLANT
CUFF TOURN SGL QUICK 34 (TOURNIQUET CUFF) ×3
CUFF TRNQT CYL 34X4X40X1 (TOURNIQUET CUFF) ×1 IMPLANT
DRAPE STERI 35X30 U-POUCH (DRAPES) ×2 IMPLANT
DRAPE U-SHAPE 47X51 STRL (DRAPES) ×3 IMPLANT
DRILL QUICK RELEASE 1/8 INCH (DRILL) ×2
DRSG MEPILEX BORDER 4X4 (GAUZE/BANDAGES/DRESSINGS) IMPLANT
DRSG MEPILEX BORDER 4X8 (GAUZE/BANDAGES/DRESSINGS) ×3 IMPLANT
ELECT REM PT RETURN 15FT ADLT (MISCELLANEOUS) ×3 IMPLANT
GLOVE BIO SURGEON STRL SZ7.5 (GLOVE) ×6 IMPLANT
GLOVE BIOGEL PI IND STRL 8 (GLOVE) ×2 IMPLANT
GLOVE BIOGEL PI INDICATOR 8 (GLOVE) ×4
GOWN STRL REUS W/ TWL LRG LVL3 (GOWN DISPOSABLE) ×1 IMPLANT
GOWN STRL REUS W/ TWL XL LVL3 (GOWN DISPOSABLE) ×1 IMPLANT
GOWN STRL REUS W/TWL LRG LVL3 (GOWN DISPOSABLE) ×3
GOWN STRL REUS W/TWL XL LVL3 (GOWN DISPOSABLE) ×3
HANDPIECE INTERPULSE COAX TIP (DISPOSABLE) ×3
IMMOBILIZER KNEE 20 (SOFTGOODS) ×2 IMPLANT
IMMOBILIZER KNEE 22 UNIV (SOFTGOODS) ×3 IMPLANT
LEGGING LITHOTOMY PAIR STRL (DRAPES) ×3 IMPLANT
MANIFOLD NEPTUNE II (INSTRUMENTS) ×3 IMPLANT
NS IRRIG 1000ML POUR BTL (IV SOLUTION) ×3 IMPLANT
PACK BLADE SAW RECIP 70 3 PT (BLADE) ×2 IMPLANT
PACK TOTAL KNEE CUSTOM (KITS) ×3 IMPLANT
PEG TWIN FEM CEMENTED MED (Knees) ×2 IMPLANT
POSITIONER SURGICAL ARM (MISCELLANEOUS) ×3 IMPLANT
SET HNDPC FAN SPRY TIP SCT (DISPOSABLE) ×1 IMPLANT
STRIP CLOSURE SKIN 1/2X4 (GAUZE/BANDAGES/DRESSINGS) ×2 IMPLANT
SUT MNCRL AB 4-0 PS2 18 (SUTURE) ×3 IMPLANT
SUT MON AB 2-0 CT1 36 (SUTURE) ×5 IMPLANT
SUT VIC AB 0 CT1 27 (SUTURE) ×3
SUT VIC AB 0 CT1 27XBRD ANTBC (SUTURE) ×1 IMPLANT
SUT VIC AB 1 CT1 27 (SUTURE) ×3
SUT VIC AB 1 CT1 27XBRD ANTBC (SUTURE) ×1 IMPLANT
TRAY TIBIAL OXFORD SZ D LF (Joint) ×2 IMPLANT
WRAP KNEE MAXI GEL POST OP (GAUZE/BANDAGES/DRESSINGS) ×2 IMPLANT
YANKAUER SUCT BULB TIP 10FT TU (MISCELLANEOUS) ×3 IMPLANT

## 2018-07-21 NOTE — Progress Notes (Signed)
Care Plan Notes 04/22/2018 to 07/21/2018       Care Plan by Shauna HughAngiulli, Renee at 07/21/2018 11:58 AM    Date of Service   Author Author Type Status Note Type File Time  07/21/2018  Shauna Hughngiulli, Renee  Signed Care Plan 07/21/2018             Spoke with patient prior to surgery. He plans to discharge to home with family to assist and the following arrangements:   DME; RW and 3n1 ordered to be delivered to room prior to discharge from [facility].  OPPT: AP OPPT - [facility] - 07/23/18 @ 1100  MD Follow Up: 08/05/18 @ 400.  Please contact Renee Angiulli, RNCM. [phone] with questions or if this plan should need to change   Thanks             Care Plan by Shauna HughAngiulli, Renee at 07/13/2018  4:22 PM    Date of Service   Author Author Type Status Note Type File Time  07/13/2018  Shauna Hughngiulli, Renee  Signed Care Plan 07/13/2018             Spoke with patient prior to surgery. He plans to discharge to home with family to assist and the following arrangements:   DME; RW and 3n1 ordered to be delivered to room prior to discharge from [facility].  OPPT: AP OPPT - [facility] - 07/23/18 @ 1100  MD Follow Up: 08/05/18 @ 400.  Please contact Renee Angiulli, RNCM. [phone] with questions or if this plan should need to change   Thanks

## 2018-07-21 NOTE — Transfer of Care (Signed)
Immediate Anesthesia Transfer of Care Note  Patient: Joseph [name]  Procedure(s) Performed: Procedure(s): LEFT UNICOMPARTMENTAL KNEE (Left)  Patient Location: PACU  Anesthesia Type:Spinal  Level of Consciousness:  sedated, patient cooperative and responds to stimulation  Airway & Oxygen Therapy:Patient Spontanous Breathing and Patient connected to face mask oxgen  Post-op Assessment:  Report given to PACU RN and Post -op Vital signs reviewed and stable  Post vital signs:  Reviewed and stable  Last Vitals:  Vitals:   07/21/18 0633  BP: 131/82  Pulse: 79  Resp: 18  Temp: 36.7 C  SpO2: 99%    Complications: No apparent anesthesia complications

## 2018-07-21 NOTE — Interval H&P Note (Signed)
History and Physical Interval Note:  07/21/2018 7:14 AM  Ginny ForthJerry R Micucci  has presented today for surgery, with the diagnosis of OA LEFT KNEE  The various methods of treatment have been discussed with the patient and family. After consideration of risks, benefits and other options for treatment, the patient has consented to  Procedure(s): LEFT UNICOMPARTMENTAL KNEE (Left) as a surgical intervention .  The patient's history has been reviewed, patient examined, no change in status, stable for surgery.  I have reviewed the patient's chart and labs.  Questions were answered to the patient's satisfaction.     Sheral Apleyimothy D Rudie Rikard

## 2018-07-21 NOTE — Op Note (Signed)
07/21/2018  9:37 AM  PATIENT:  Joseph [name]    PRE-OPERATIVE DIAGNOSIS:  OA LEFT KNEE  POST-OPERATIVE DIAGNOSIS:  Same  PROCEDURE:  LEFT UNICOMPARTMENTAL KNEE  SURGEON:  Sheral Apleyimothy D Jessey Stehlin, MD  PHYSICIAN ASSISTANT: Aquilla HackerHenry Martensen, PA-C, he was present and scrubbed throughout the case, critical for completion in a timely fashion, and for retraction, instrumentation, and closure.   ANESTHESIA:   General  PREOPERATIVE INDICATIONS:  Joseph [name] is a  47 y.o. male with a diagnosis of OA LEFT KNEE who failed conservative measures and elected for surgical management.    The risks benefits and alternatives were discussed with the patient preoperatively including but not limited to the risks of infection, bleeding, nerve injury, cardiopulmonary complications, blood clots, the need for revision surgery, among others, and the patient was willing to proceed.  OPERATIVE IMPLANTS: Biomet Oxford mobile bearing medial compartment arthroplasty. Femoral Component: medium. Tibial tray: D, Size 4 poly.   OPERATIVE FINDINGS: Endstage grade 4 medial compartment osteoarthritis. No significant changes in the lateral or patellofemoral joint  OPERATIVE PROCEDURE: The patient was brought to the operating room placed in supine position. General anesthesia was administered. IV antibiotics were given. The lower extremity was placed in the legholder and prepped and draped in usual sterile fashion.  Time out was performed.  The leg was elevated and exsanguinated and the tourniquet was inflated. Anteromedial incision was performed, and I took care to preserve the MCL. Parapatellar incision was carried out, and the osteophytes were excised, along with the medial meniscus and a small portion of the fat pad.  The extra medullary tibial cutting jig was applied, using the spoon and the 4mm G-Clamp, and I took care to protect the anterior cruciate ligament insertion and the tibial spine. The medial collateral ligament  was also protected, and I resected my proximal tibia, matching the anatomic slope.   The proximal tibial bony cut was removed in one piece, and I turned my attention to the femur.  The intramedullary femoral rod was placed using the drill, and then using the appropriate reference, I assembled the femoral jig, setting my posterior cutting block. I resected my posterior femur, and then measured my gap.   I then used the mill to match the extension gap to the flexion gap. The gaps were then measured again with the appropriate feeler gauges. Once I had balanced flexion and extension gaps, I then completed the preparation of the femur.  I milled off the anterior aspect of the distal femur to prevent impingement. I also exposed the tibia, and selected the above-named component, and then used the cutting jig to prepare the keel slot on the tibia. I also used the awl to curette out the bone to complete the preparation of the keel. The back wall was intact.  I then placed trial components, and it was found to have excellent motion, and appropriate balance.  I then cemented the components into place, cementing the tibia first, removing all excess cement, and then cementing the femur.  All loose cement was removed.  The real polyethylene insert was applied manually, and the knee was taken through functional range of motion, and found to have excellent stability and restoration of joint motion, with excellent balance.  The wounds were irrigated copiously, and the parapatellar tissue closed with Vicryl, followed by Vicryl for the subcutaneous tissue, with routine closure with Steri-Strips and sterile gauze.  The tourniquet was released, and the patient was awakened and extubated and returned to PACU  in stable and satisfactory condition. There were no complications.  POSTOPERATIVE PLAN: DVT px will consist of SCD's, mobiliation and chemical px, WBAT     Renette Butters, MD

## 2018-07-21 NOTE — Anesthesia Postprocedure Evaluation (Signed)
Anesthesia Post Note  Patient: Joseph ForthJerry R [name]  Procedure(s) Performed: LEFT UNICOMPARTMENTAL KNEE (Left Knee)     Patient location during evaluation: PACU Anesthesia Type: Spinal Level of consciousness: awake Pain management: pain level controlled Vital Signs Assessment: post-procedure vital signs reviewed and stable Respiratory status: spontaneous breathing Cardiovascular status: stable Anesthetic complications: no    Last Vitals:  Vitals:   07/21/18 0633 07/21/18 0924  BP: 131/82 109/68  Pulse: 79 78  Resp: 18 15  Temp: 36.7 C 36.7 C  SpO2: 99% 96%    Last Pain:  Vitals:   07/21/18 0633  TempSrc: Oral  PainSc:                  [name]

## 2018-07-21 NOTE — Anesthesia Procedure Notes (Signed)
Anesthesia Regional Block: Adductor canal block   Pre-Anesthetic Checklist: ,, timeout performed, Correct Patient, Correct Site, Correct Laterality, Correct Procedure, Correct Position, site marked, Risks and benefits discussed,  Surgical consent,  Pre-op evaluation,  At surgeon's request and post-op pain management  Laterality: Left  Prep: chloraprep       Needles:  Injection technique: Single-shot  Needle Type: Stimulator Needle - 80          Additional Needles:   Procedures: Doppler guided,,,, ultrasound used (permanent image in chart),,,,  Narrative:  Start time: 07/21/2018 6:55 AM End time: 07/21/2018 7:15 AM Injection made incrementally with aspirations every 5 mL.  Performed by: Personally  Anesthesiologist: Dorris SinghGreen, Kento Gossman, MD

## 2018-07-21 NOTE — Addendum Note (Signed)
Addendum  created 07/21/18 1734 by Dorris SinghGreen, Kyzen Horn, MD   Attestation recorded in Intraprocedure, Child order released for a procedure order, Image imported, Intraprocedure Attestations filed, Intraprocedure Blocks edited, Sign clinical note

## 2018-07-21 NOTE — Care Plan (Signed)
Spoke with patient prior to surgery. He plans to discharge to home with family to assist and the following arrangements:   DME; RW and 3n1 ordered to be delivered to room prior to discharge from [facility].  OPPT: AP OPPT - [facility] [place] - 07/23/18 @ 1100  MD Follow Up: 08/05/18 @ 400.  Please contact Renee Angiulli, RNCM. [phone] with questions or if this plan should need to change   Thanks

## 2018-07-21 NOTE — Anesthesia Postprocedure Evaluation (Signed)
Anesthesia Post Note  Patient: Joseph [name]  Procedure(s) Performed: LEFT UNICOMPARTMENTAL KNEE (Left Knee)     Patient location during evaluation: PACU Anesthesia Type: Spinal Level of consciousness: awake Pain management: pain level controlled Respiratory status: spontaneous breathing Cardiovascular status: stable Postop Assessment: no apparent nausea or vomiting Anesthetic complications: no    Last Vitals:  Vitals:   07/21/18 1408 07/21/18 1711  BP: 114/73 121/75  Pulse: 97 100  Resp: 16 16  Temp: 36.7 C 37 C  SpO2: 94% 95%    Last Pain:  Vitals:   07/21/18 1711  TempSrc: Oral  PainSc:                  [name]

## 2018-07-21 NOTE — Plan of Care (Signed)
Pt alert and oriented, doing well post op.  Wife at the bedside.  Pain controlled with PO pain meds. RN will monitor. Discussed plan of care w pt.

## 2018-07-21 NOTE — Evaluation (Signed)
Physical Therapy Evaluation Patient Details Name: Joseph ForthJerry R Lankford MRN: [id number] DOB: Aug 09, 1971 Today's Date: 07/21/2018   History of Present Illness  47 yo male s/p L uni knee 07/21/18.   Clinical Impression  On eval POD 0, pt was Min guard assist for mobility. He walked ~75 feet with a RW. Mild pain with activity. Pt tolerated activity well. He is eager to regain his independence and return to his PLOF. Will follow and progress activity as tolerated. Per chart, plan is for d/c home with OP PT f/u.     Follow Up Recommendations Follow surgeon's recommendation for DC plan and follow-up therapies    Equipment Recommendations  3in1 (PT);Rolling walker with 5" wheels    Recommendations for Other Services       Precautions / Restrictions Precautions Precautions: Fall Required Braces or Orthoses: Knee Immobilizer - Left Knee Immobilizer - Left: Discontinue once straight leg raise with < 10 degree lag Restrictions Weight Bearing Restrictions: No Other Position/Activity Restrictions: WBAT      Mobility  Bed Mobility Overal bed mobility: Needs Assistance Bed Mobility: Supine to Sit     Supine to sit: Supervision     General bed mobility comments: for safety, lines  Transfers Overall transfer level: Needs assistance Equipment used: Rolling walker (2 wheeled) Transfers: Sit to/from Stand Sit to Stand: Min guard         General transfer comment: VCS safety, hand placement. Close guard for safety.   Ambulation/Gait Ambulation/Gait assistance: Min guard Gait Distance (Feet): 75 Feet Assistive device: Rolling walker (2 wheeled) Gait Pattern/deviations: Step-to pattern     General Gait Details: close guard for safety. VCS sequence.   Stairs            Wheelchair Mobility    Modified Rankin (Stroke Patients Only)       Balance                                             Pertinent Vitals/Pain Pain Assessment: 0-10 Pain Score: 5  Pain  Location: L knee Pain Descriptors / Indicators: Aching;Sore Pain Intervention(s): Monitored during session;Repositioned;Ice applied    Home Living Family/patient expects to be discharged to:: Private residence Living Arrangements: Spouse/significant other Available Help at Discharge: Family Type of Home: House Home Access: Stairs to enter Entrance Stairs-Rails: Right Entrance Stairs-Number of Steps: 3 Home Layout: Able to live on main level with bedroom/bathroom Home Equipment: Cane - single point      Prior Function Level of Independence: Independent               Hand Dominance        Extremity/Trunk Assessment   Upper Extremity Assessment Upper Extremity Assessment: Overall WFL for tasks assessed    Lower Extremity Assessment Lower Extremity Assessment: Generalized weakness(s/p L TKA)    Cervical / Trunk Assessment Cervical / Trunk Assessment: Normal  Communication   Communication: No difficulties  Cognition Arousal/Alertness: Awake/alert Behavior During Therapy: WFL for tasks assessed/performed Overall Cognitive Status: Within Functional Limits for tasks assessed                                        General Comments      Exercises     Assessment/Plan    PT Assessment Patient needs continued  PT services  PT Problem List Decreased strength;Decreased mobility;Decreased range of motion;Decreased activity tolerance;Pain;Decreased knowledge of use of DME       PT Treatment Interventions DME instruction;Gait training;Therapeutic activities;Functional mobility training;Balance training;Therapeutic exercise;Patient/family education;Stair training    PT Goals (Current goals can be found in the Care Plan section)  Acute Rehab PT Goals Patient Stated Goal: regain PLOF.  PT Goal Formulation: With patient/family Time For Goal Achievement: 08/04/18    Frequency 7X/week   Barriers to discharge        Co-evaluation                AM-PAC PT "6 Clicks" Daily Activity  Outcome Measure Difficulty turning over in bed (including adjusting bedclothes, sheets and blankets)?: None Difficulty moving from lying on back to sitting on the side of the bed? : None Difficulty sitting down on and standing up from a chair with arms (e.g., wheelchair, bedside commode, etc,.)?: A Little Help needed moving to and from a bed to chair (including a wheelchair)?: A Little Help needed walking in hospital room?: A Little Help needed climbing 3-5 steps with a railing? : A Little 6 Click Score: 20    End of Session Equipment Utilized During Treatment: Left knee immobilizer;Gait belt Activity Tolerance: Patient tolerated treatment well Patient left: in chair;with call bell/phone within reach   PT Visit Diagnosis: Difficulty in walking, not elsewhere classified (R26.2);Pain;Other abnormalities of gait and mobility (R26.89) Pain - Right/Left: Left Pain - part of body: Knee    Time: 4540-98111504-1530 PT Time Calculation (min) (ACUTE ONLY): 26 min   Charges:   PT Evaluation $PT Eval Low Complexity: 1 Low PT Treatments $Gait Training: 8-22 mins          Rebeca AlertJannie Kemiya Batdorf, MPT Pager: [phone]

## 2018-07-21 NOTE — Anesthesia Procedure Notes (Signed)
Spinal  Patient location during procedure: OR Start time: 07/21/2018 7:26 AM End time: 07/21/2018 7:30 AM Reason for block: at surgeon's request Staffing Resident/CRNA: Anne Fu, CRNA Performed: resident/CRNA  Preanesthetic Checklist Completed: patient identified, site marked, surgical consent, pre-op evaluation, timeout performed, IV checked, risks and benefits discussed and monitors and equipment checked Spinal Block Patient position: sitting Prep: DuraPrep Patient monitoring: heart rate, continuous pulse ox and blood pressure Approach: midline Location: L2-3 Injection technique: single-shot Needle Needle type: Pencan  Needle gauge: 24 G Needle length: 9 cm Assessment Sensory level: T6 Additional Notes Expiration date of kit checked and confirmed. Patient tolerated procedure well, without complications. X 1 attempt with noted clear CSF return. Loss of motor and sensory on exam post injection.

## 2018-07-21 NOTE — Progress Notes (Signed)
Rt placed CPAP in pt room if needed. Pt wears CPAP QHS at home. Pt has his on mask and tubing.

## 2018-07-21 NOTE — Discharge Instructions (Signed)

## 2018-07-22 ENCOUNTER — Encounter (HOSPITAL_COMMUNITY): Payer: Self-pay | Admitting: Orthopedic Surgery

## 2018-07-22 DIAGNOSIS — G4733 Obstructive sleep apnea (adult) (pediatric): Secondary | ICD-10-CM | POA: Diagnosis not present

## 2018-07-22 DIAGNOSIS — M1712 Unilateral primary osteoarthritis, left knee: Secondary | ICD-10-CM | POA: Diagnosis not present

## 2018-07-22 DIAGNOSIS — E119 Type 2 diabetes mellitus without complications: Secondary | ICD-10-CM | POA: Diagnosis not present

## 2018-07-22 DIAGNOSIS — F17210 Nicotine dependence, cigarettes, uncomplicated: Secondary | ICD-10-CM | POA: Diagnosis not present

## 2018-07-22 DIAGNOSIS — G894 Chronic pain syndrome: Secondary | ICD-10-CM | POA: Diagnosis not present

## 2018-07-22 DIAGNOSIS — K219 Gastro-esophageal reflux disease without esophagitis: Secondary | ICD-10-CM | POA: Diagnosis not present

## 2018-07-22 LAB — GLUCOSE, CAPILLARY: Glucose-Capillary: 323 mg/dL — ABNORMAL HIGH (ref 70–99)

## 2018-07-22 NOTE — Progress Notes (Signed)
Physical Therapy Treatment Patient Details Name: Joseph [name] MRN: [id number] DOB: July 30, 1971 Today's Date: 07/22/2018    History of Present Illness 47 yo male s/p L uni knee 07/21/18.     PT Comments    Progressing with mobility. Reviewed exercises, gait training, and stair training. All education completed. Okay to d/c from PT standpoint-made RN aware.    Follow Up Recommendations  Follow surgeon's recommendation for DC plan and follow-up therapies     Equipment Recommendations  3in1 (PT);Rolling walker with 5" wheels    Recommendations for Other Services       Precautions / Restrictions Precautions Precautions: Fall Required Braces or Orthoses: Knee Immobilizer - Left Knee Immobilizer - Left: Discontinue once straight leg raise with < 10 degree lag(able to SLR on today-did not use KI) Restrictions Weight Bearing Restrictions: No Other Position/Activity Restrictions: WBAT    Mobility  Bed Mobility Overal bed mobility: Needs Assistance Bed Mobility: Supine to Sit;Sit to Supine     Supine to sit: Supervision Sit to supine: Supervision   General bed mobility comments: with KI donned  Transfers Overall transfer level: Needs assistance Equipment used: Rolling walker (2 wheeled) Transfers: Sit to/from Stand Sit to Stand: Supervision         General transfer comment: VCs safety, hand placement  Ambulation/Gait Ambulation/Gait assistance: Supervision Gait Distance (Feet): 300 Feet Assistive device: Rolling walker (2 wheeled) Gait Pattern/deviations: Step-to pattern;Antalgic     General Gait Details: for safety.    Stairs Stairs: Yes Stairs assistance: Supervision Stair Management: One rail Left;Step to pattern;Forwards Number of Stairs: 3 General stair comments: VCs safety, technique, sequence. Pt declined cane use. He preferred to only use 1 railing.    Wheelchair Mobility    Modified Rankin (Stroke Patients Only)       Balance                                            Cognition Arousal/Alertness: Awake/alert Behavior During Therapy: WFL for tasks assessed/performed Overall Cognitive Status: Within Functional Limits for tasks assessed                                        Exercises Total Joint Exercises Ankle Circles/Pumps: AROM;Both;20 reps;Seated Quad Sets: AROM;Both;10 reps;Supine Heel Slides: AAROM;Left;10 reps;Supine Hip ABduction/ADduction: AROM;Left;10 reps;Supine Straight Leg Raises: AROM;Left;10 reps;Supine Long Arc Quad: AROM;Left;10 reps;Seated Knee Flexion: AROM;Left;10 reps;Seated Goniometric ROM: ~10-70 degrees    General Comments        Pertinent Vitals/Pain Pain Assessment: 0-10 Pain Score: 7  Pain Location: L knee Pain Descriptors / Indicators: Aching;Sore Pain Intervention(s): Monitored during session;Ice applied;Repositioned    Home Living Family/patient expects to be discharged to:: Private residence Living Arrangements: Spouse/significant other Available Help at Discharge: Family         Home Equipment: Gilmer Morane - single point Additional Comments: shower has 2 built in seats (short); toilet is in own closet; nothing to push from    Prior Function Level of Independence: Independent          PT Goals (current goals can now be found in the care plan section) Acute Rehab PT Goals Patient Stated Goal: regain PLOF.  Progress towards PT goals: Progressing toward goals    Frequency    7X/week  PT Plan Current plan remains appropriate    Co-evaluation              AM-PAC PT "6 Clicks" Daily Activity  Outcome Measure  Difficulty turning over in bed (including adjusting bedclothes, sheets and blankets)?: None Difficulty moving from lying on back to sitting on the side of the bed? : None Difficulty sitting down on and standing up from a chair with arms (e.g., wheelchair, bedside commode, etc,.)?: A Little Help needed moving to and from  a bed to chair (including a wheelchair)?: A Little Help needed walking in hospital room?: A Little Help needed climbing 3-5 steps with a railing? : A Little 6 Click Score: 20    End of Session Equipment Utilized During Treatment: Gait belt Activity Tolerance: Patient tolerated treatment well Patient left: in bed;with call bell/phone within reach   PT Visit Diagnosis: Difficulty in walking, not elsewhere classified (R26.2);Pain;Other abnormalities of gait and mobility (R26.89) Pain - Right/Left: Left Pain - part of body: Knee     Time: [id number] PT Time Calculation (min) (ACUTE ONLY): 26 min  Charges:  $Gait Training: 8-22 mins $Therapeutic Exercise: 8-22 mins                        Rebeca AlertJannie Desirae Mancusi, MPT Pager: [phone]

## 2018-07-22 NOTE — Care Management CC44 (Signed)
Condition Code 44 Documentation Completed  Patient Details  Name: Joseph [name] MRN: [id number] Date of Birth: 1971-08-07   Condition Code 44 given:  Yes Patient signature on Condition Code 44 notice:  Yes Documentation of 2 MD's agreement:  Yes Code 44 added to claim:  Yes    Alexis Goodelleele, Deetra Booton K, RN 07/22/2018, 12:04 PM

## 2018-07-22 NOTE — Discharge Summary (Signed)
Physician Discharge Summary  Patient ID: Joseph [name] MRN: [id number] DOB/AGE: Oct 08, 1971 47 y.o.  Admit date: 07/21/2018 Discharge date: 07/22/2018  Admission Diagnoses:  Primary osteoarthritis of left knee  Discharge Diagnoses:  Principal Problem:   Osteoarthritis of the knee (Left) Active Problems:   Cigarette smoker   Chronic pain syndrome   Diabetes (HCC)   OSA (obstructive sleep apnea)   GERD (gastroesophageal reflux disease)   COPD without exacerbation (HCC)   Opiate use   Opioid-induced constipation (OIC)   Primary osteoarthritis of knee   Status post unicompartmental knee replacement, left   Past Medical History:  Diagnosis Date  . Anxiety   . Arthritis    oa left knee and lower back  . Asthma   . Cataract    hx of bilateral  . Chronic back pain   . Chronic pain of left knee   . COPD (chronic obstructive pulmonary disease) (HCC)   . Diabetes mellitus    type 2  . GERD (gastroesophageal reflux disease)   . INGUINAL PAIN, LEFT 10/03/2009   Qualifier: Diagnosis of  By: Darrick PennaFields MD, Sandi L   . Overdose    03-24-18 accidental  . Shortness of breath    with heavy exertion  . Sleep apnea     Surgeries: Procedure(s): LEFT UNICOMPARTMENTAL KNEE on 07/21/2018   Consultants (if any):   Discharged Condition: Improved  Hospital Course: Joseph [name] is an 47 y.o. male who was admitted 07/21/2018 with a diagnosis of Primary osteoarthritis of left knee and went to the operating room on 07/21/2018 and underwent the above named procedures.    He was given perioperative antibiotics:  Anti-infectives (From admission, onward)   Start     Dose/Rate Route Frequency Ordered Stop   07/21/18 1400  ceFAZolin (ANCEF) IVPB 2g/100 mL premix     2 g 200 mL/hr over 30 Minutes Intravenous Every 6 hours 07/21/18 1109 07/21/18 2003   07/21/18 0600  ceFAZolin (ANCEF) IVPB 2g/100 mL premix     2 g 200 mL/hr over 30 Minutes Intravenous On call to O.R. 07/21/18 [id number] 07/21/18 0732     .  He was given sequential compression devices, early ambulation, and chemical px for DVT prophylaxis.  He benefited maximally from the hospital stay and there were no complications.    Recent vital signs:  Vitals:   07/22/18 0159 07/22/18 0626  BP: 127/82 129/86  Pulse: 81 78  Resp: 20 20  Temp: 97.7 F (36.5 C) 97.7 F (36.5 C)  SpO2: 94% 98%    Recent laboratory studies:  Lab Results  Component Value Date   HGB 15.0 07/13/2018   HGB 13.8 03/25/2018   HGB 15.1 03/24/2018   Lab Results  Component Value Date   WBC 9.8 07/13/2018   PLT 202 07/13/2018   No results found for: INR Lab Results  Component Value Date   NA 140 07/13/2018   K 3.8 07/13/2018   CL 103 07/13/2018   CO2 27 07/13/2018   BUN 8 07/13/2018   CREATININE 0.74 07/13/2018   GLUCOSE 157 (H) 07/13/2018    Discharge Medications:     Diagnostic Studies: Dg Knee Left Port  Result Date: 07/21/2018 CLINICAL DATA:  Status post left partial knee replacement. EXAM: PORTABLE LEFT KNEE - 1-2 VIEW COMPARISON:  Preoperative MRI of the left knee. FINDINGS: The patient has undergone replacement of the components of the medial compartment of the left knee. Radiographic positioning of the prosthetic components is good. The interface  with the native bone appears normal. There is a moderate amount of fluid and air in the anterior aspect of the joint space. IMPRESSION: No immediate complication following partial left knee joint replacement. Electronically Signed   By: David  SwazilandJordan M.D.   On: 07/21/2018 10:00    Disposition:     Follow-up Information    Sheral ApleyMurphy, Corrado Hymon D, MD Follow up.   Specialty:  Orthopedic Surgery Contact information: [address] [place] [address] [phone]            Signed: Sheral Apleyimothy D Latina Frank 07/22/2018, 6:52 AM

## 2018-07-22 NOTE — Care Management Obs Status (Signed)
MEDICARE OBSERVATION STATUS NOTIFICATION   Patient Details  Name: Ginny ForthJerry R Higginbotham MRN: [id number] Date of Birth: 15-Nov-1971   Medicare Observation Status Notification Given:       Alexis Goodelleele, Ondre Salvetti K, RN 07/22/2018, 12:04 PM

## 2018-07-22 NOTE — Evaluation (Signed)
Occupational Therapy Evaluation Patient Details Name: Joseph ForthJerry R [name] MRN: [id number] DOB: Oct 09, 1971 Today's Date: 07/22/2018    History of Present Illness 47 yo male s/p L uni knee 07/21/18.    Clinical Impression   Pt was admitted for the above sx.  All education was completed. No further OT is needed at this time    Follow Up Recommendations  Supervision/Assistance - 24 hour(initial)    Equipment Recommendations  3 in 1 bedside commode    Recommendations for Other Services       Precautions / Restrictions Precautions Precautions: Fall Required Braces or Orthoses: Knee Immobilizer - Left Knee Immobilizer - Left: Discontinue once straight leg raise with < 10 degree lag Restrictions Other Position/Activity Restrictions: WBAT      Mobility Bed Mobility         Supine to sit: Supervision     General bed mobility comments: with KI donned  Transfers   Equipment used: Rolling walker (2 wheeled)   Sit to Stand: Supervision         General transfer comment: initially, cues for UE placement    Balance                                           ADL either performed or assessed with clinical judgement   ADL Overall ADL's : Needs assistance/impaired     Grooming: Oral care;Supervision/safety;Standing       Lower Body Bathing: Minimal assistance       Lower Body Dressing: Moderate assistance   Toilet Transfer: Min guard;Comfort height toilet;BSC;RW             General ADL Comments: ambulated to bathroom, practiced toilet transfer and demonstrated shower transfer.  Pt will need 3:1 over his toilet.  He verbalizes understanding of shower and did not feel he needed to practice.  Educated on walker safety, knee precautions/use of bone foam.       Vision         Perception     Praxis      Pertinent Vitals/Pain Pain Score: 5  Pain Location: L knee Pain Descriptors / Indicators: Aching;Sore Pain Intervention(s): Limited activity  within patient's tolerance;Monitored during session;Premedicated before session;Repositioned     Hand Dominance     Extremity/Trunk Assessment Upper Extremity Assessment Upper Extremity Assessment: Overall WFL for tasks assessed           Communication     Cognition Arousal/Alertness: Awake/alert Behavior During Therapy: WFL for tasks assessed/performed Overall Cognitive Status: Within Functional Limits for tasks assessed                                     General Comments       Exercises     Shoulder Instructions      Home Living Family/patient expects to be discharged to:: Private residence Living Arrangements: Spouse/significant other Available Help at Discharge: Family               Bathroom Shower/Tub: Walk-in Soil scientistshower   Bathroom Toilet: Handicapped height     Home Equipment: Cane - single point   Additional Comments: shower has 2 built in seats (short); toilet is in own closet; nothing to push from      Prior Functioning/Environment Level of Independence: Independent  OT Problem List:        OT Treatment/Interventions:      OT Goals(Current goals can be found in the care plan section) Acute Rehab OT Goals Patient Stated Goal: regain PLOF.  OT Goal Formulation: All assessment and education complete, DC therapy  OT Frequency:     Barriers to D/C:            Co-evaluation              AM-PAC PT "6 Clicks" Daily Activity     Outcome Measure Help from another person eating meals?: None Help from another person taking care of personal grooming?: A Little Help from another person toileting, which includes using toliet, bedpan, or urinal?: A Little Help from another person bathing (including washing, rinsing, drying)?: A Little Help from another person to put on and taking off regular upper body clothing?: A Little Help from another person to put on and taking off regular lower body clothing?: A Lot 6  Click Score: 18   End of Session    Activity Tolerance: Patient tolerated treatment well Patient left: in chair;with call bell/phone within reach  OT Visit Diagnosis: Pain Pain - Right/Left: Left Pain - part of body: Knee                Time: [id number] OT Time Calculation (min): 22 min Charges:  OT General Charges $OT Visit: 1 Visit OT Evaluation $OT Eval Low Complexity: 1 Low  Marica OtterMaryellen Burnette [name], OTR/L [phone] 07/22/2018  Joseph [name] 07/22/2018, 8:29 AM

## 2018-07-23 ENCOUNTER — Encounter (HOSPITAL_COMMUNITY): Payer: Self-pay

## 2018-07-23 ENCOUNTER — Other Ambulatory Visit: Payer: Self-pay

## 2018-07-23 ENCOUNTER — Ambulatory Visit (HOSPITAL_COMMUNITY): Payer: Medicare Other | Attending: Orthopedic Surgery

## 2018-07-23 DIAGNOSIS — M6281 Muscle weakness (generalized): Secondary | ICD-10-CM | POA: Diagnosis not present

## 2018-07-23 DIAGNOSIS — R600 Localized edema: Secondary | ICD-10-CM | POA: Insufficient documentation

## 2018-07-23 DIAGNOSIS — M25662 Stiffness of left knee, not elsewhere classified: Secondary | ICD-10-CM | POA: Diagnosis not present

## 2018-07-23 DIAGNOSIS — M25562 Pain in left knee: Secondary | ICD-10-CM | POA: Diagnosis not present

## 2018-07-23 DIAGNOSIS — R262 Difficulty in walking, not elsewhere classified: Secondary | ICD-10-CM | POA: Insufficient documentation

## 2018-07-23 NOTE — Patient Instructions (Signed)
Access Code: [email or web address]  URL: [email or web address]  Date: 07/23/2018  Prepared by: Jac CanavanBrooke Parker Wherley   Exercises Supine Quadricep Sets - 10 reps - 3 sets - 5 hold - 1x daily - 7x weekly Supine Heel Slide with Strap - 10 reps - 3 sets - 5-10 hold - 1x daily - 7x weekly

## 2018-07-23 NOTE — Therapy (Signed)
[facility] [address] [place], [place], [address] Phone: [phone]   Fax:  [phone]  Physical Therapy Evaluation  Patient Details  Name: Joseph [name] MRN: [id number] Date of Birth: Jan 19, 1971 Referring Provider: Michell Heinrich, MD   Encounter Date: 07/23/2018  PT End of Session - 07/23/18 1158    Visit Number  1    Number of Visits  19    Date for PT Re-Evaluation  09/03/18   mini reassess on 08/13/18   Authorization Type  Medicare    Authorization Time Period  07/23/18 to 09/03/18    Authorization - Visit Number  1    Authorization - Number of Visits  10    PT Start Time  1117    PT Stop Time  1152    PT Time Calculation (min)  35 min    Activity Tolerance  Patient tolerated treatment well;Patient limited by pain    Behavior During Therapy  [facility] for tasks assessed/performed       Past Medical History:  Diagnosis Date  . Anxiety   . Arthritis    oa left knee and lower back  . Asthma   . Cataract    hx of bilateral  . Chronic back pain   . Chronic pain of left knee   . COPD (chronic obstructive pulmonary disease) (HCC)   . Diabetes mellitus    type 2  . GERD (gastroesophageal reflux disease)   . INGUINAL PAIN, LEFT 10/03/2009   Qualifier: Diagnosis of  By: Darrick Penna MD, Sandi L   . Overdose    03-24-18 accidental  . Shortness of breath    with heavy exertion  . Sleep apnea     Past Surgical History:  Procedure Laterality Date  . CARPAL TUNNEL RELEASE Left   . CATARACT EXTRACTION W/PHACO Right 03/11/2016   Procedure: CATARACT EXTRACTION PHACO AND INTRAOCULAR LENS PLACEMENT (IOC);  Surgeon: Gemma Payor, MD;  Location: [facility];  Service: Ophthalmology;  Laterality: Right;  CDE 4.24  . EYE SURGERY Right 2018   ioc with lens replacement  . HERNIA REPAIR Bilateral 2010   umbilical  . KNEE ARTHROSCOPY WITH MEDIAL MENISECTOMY Left 04/22/2014   Procedure: LEFT KNEE ARTHROSCOPY WITH MEDIAL MENISECTOMY;  Surgeon: Vickki Hearing, MD;  Location: [facility];  Service: Orthopedics;  Laterality: Left;  . KNEE ARTHROSCOPY WITH MEDIAL MENISECTOMY Right 12/06/2015   Procedure: RIGHT KNEE ARTHROSCOPY WITH MEDIAL MENISECTOMY;  Surgeon: Vickki Hearing, MD;  Location: [facility];  Service: Orthopedics;  Laterality: Right;  . KNEE ARTHROSCOPY WITH MEDIAL MENISECTOMY Left 03/20/2017   Procedure: KNEE ARTHROSCOPY WITH MEDIAL MENISECTOMY;  Surgeon: Vickki Hearing, MD;  Location: [facility];  Service: Orthopedics;  Laterality: Left;  . ORIF WRIST FRACTURE Left 12/19/2016   Procedure: OPEN REDUCTION INTERNAL FIXATION (ORIF) WRIST FRACTURE;  Surgeon: Betha Loa, MD;  Location: [facility];  Service: Orthopedics;  Laterality: Left;  accumed   . PARTIAL KNEE ARTHROPLASTY Left 07/21/2018   Procedure: LEFT UNICOMPARTMENTAL KNEE;  Surgeon: Sheral Apley, MD;  Location: [facility];  Service: Orthopedics;  Laterality: Left;  . SHOULDER ARTHROCENTESIS Right 2008 or 2010  . VASECTOMY  2014    There were no vitals filed for this visit.   Subjective Assessment - 07/23/18 1121    Subjective  Pt reports undergoing partial L TKA on 07/21/18 by Dr. Margarita Rana. He had his medial knee joint replaced. Pain and bone on bone for 2.5 years is  what led to his surgery. Bending, walking, and normal day to day things that he normally did prior to surgery is what he is having the most difficulty doing right now. He's currently ambulating with a RW but prior to his surgery he did not use any AD, excpet for a SPC intermittently when his L knee pain was really bad.     Limitations  Standing;Walking;House hold activities    How long can you sit comfortably?  no issues    How long can you stand comfortably?  15 mins    How long can you walk comfortably?  4ft with RW    Patient Stated Goals  get L knee fixed/back to normal    Currently in Pain?  Yes    Pain Score  8     Pain Location  Knee    Pain Orientation  Left    Pain Descriptors /  Indicators  Sharp;Throbbing    Pain Type  Surgical pain    Pain Onset  In the past 7 days    Pain Frequency  Constant    Aggravating Factors   bending, WB activities, turning wrong way    Pain Relieving Factors  ice, rest    Effect of Pain on Daily Activities  increased         [facility] PT Assessment - 07/23/18 0001      Assessment   Medical Diagnosis  L partial TKA    Referring Provider  Michell Heinrich, MD    Onset Date/Surgical Date  07/21/18    Next MD Visit  07/31/18    Prior Therapy  yes for R shoulder (2008), none for present issue      Balance Screen   Has the patient fallen in the past 6 months  No    Has the patient had a decrease in activity level because of a fear of falling?   No    Is the patient reluctant to leave their home because of a fear of falling?   No      Prior Function   Level of Independence  Independent    Vocation  On disability    Leisure  just liked to stay active      Observation/Other Assessments   Focus on Therapeutic Outcomes (FOTO)   to be completed at next visit as FOTO would not pull up pt's information      Observation/Other Assessments-Edema    Edema  Circumferential      Circumferential Edema   Circumferential - Right  38cm, joint line    Circumferential - Left   42.5cm, joint line      ROM / Strength   AROM / PROM / Strength  AROM;Strength      AROM   AROM Assessment Site  Knee    Right/Left Knee  Left    Left Knee Extension  18    Left Knee Flexion  55      Strength   Strength Assessment Site  Hip;Knee;Ankle    Right Hip Flexion  5/5    Right Hip Extension  4/5    Right Hip ABduction  4+/5    Left Hip Flexion  2+/5    Left Hip Extension  4/5    Left Hip ABduction  4/5    Right Knee Flexion  5/5    Right Knee Extension  5/5    Left Knee Flexion  2+/5    Left Knee Extension  2+/5    Right Ankle  Dorsiflexion  5/5    Left Ankle Dorsiflexion  4+/5      Palpation   Patella mobility  hypomobile throughout    Palpation  comment  increased swelling and restrictions throughout knee      Ambulation/Gait   Ambulation Distance (Feet)  188 Feet     Assistive device  Rolling walker    Gait Pattern  Step-through pattern;Decreased stance time - left;Decreased stride length;Decreased hip/knee flexion - left;Decreased dorsiflexion - left;Decreased weight shift to left;Trendelenburg;Antalgic      Balance   Balance Assessed  Yes      Static Standing Balance   Static Standing - Balance Support  No upper extremity supported    Static Standing Balance -  Activities   Single Leg Stance - Right Leg;Single Leg Stance - Left Leg    Static Standing - Comment/# of Minutes  R: 16 sec L: 0sec      Standardized Balance Assessment   Standardized Balance Assessment  Five Times Sit to Stand    Five times sit to stand comments   28sec, BUE support, LLE extended           Objective measurements completed on examination: See above findings.        PT Education - 07/23/18 1157    Education Details  exam findings, POC, HEP    Person(s) Educated  Patient    Methods  Explanation;Demonstration;Handout    Comprehension  Verbalized understanding;Returned demonstration       PT Short Term Goals - 07/23/18 1210      PT SHORT TERM GOAL #1   Title  Pt will have decreased L knee edema by 3cm or > in order to decrease pain and improve ROM.     Time  3    Period  Weeks    Status  New    Target Date  08/13/18      PT SHORT TERM GOAL #2   Title  Pt will have improved L knee AROM from 10-95deg in order to decrease pain and maximize gait.    Time  3    Period  Weeks    Status  New      PT SHORT TERM GOAL #3   Title  Pt will have 1/2 grade improvement throughout MMT in order to decrease pain and maximize gait.     Time  3    Period  Weeks    Status  New      PT SHORT TERM GOAL #4   Title  Pt will be able to perform 5xSTS in 15 sec or < without UE and with proper mechanics to demo improved balance and functional  strength.    Time  3    Period  Weeks    Status  New        PT Long Term Goals - 07/23/18 1211      PT LONG TERM GOAL #1   Title  Pt will have improved L knee AROM from 0-115deg in order to further decrease pain and maximize stair ambulation.    Time  6    Period  Weeks    Status  New    Target Date  09/03/18      PT LONG TERM GOAL #2   Title  Pt will have 1 grade improvement throughout MMT in order to further decrease pain and maximize overall functional mobility.    Time  6    Period  Weeks    Status  New      PT LONG TERM GOAL #3   Title  Pt will be able to ambulate 533ft or > during with LRAD and gait WFL in order to demo improved functional mobility and maximize community access.    Time  6    Period  Weeks    Status  New      PT LONG TERM GOAL #4   Title  Pt will be able to perform L SLS for 15 sec without UE support in order to demo improved balance and maximize his gait on uneven ground and stair ambulation.    Time  6    Period  Weeks    Status  New             Plan - 07/23/18 1208    Clinical Impression Statement  Pt is pleasant 47YO M who presents to OPPT s/p L partial TKA on 07/21/18 to Dr. Margarita Rana. Pt currently presents with post-op deficits in edema, ROM, MMT, pain, soft tissue restrictions, balance, gait, functional strength, and functional mobility. His AROM was 18-55deg today and he was noted to have about 5cm increased swelling at L knee joint line compared to R knee joint line. Pt needs skilled PT intervention to address these impairments in order to decrease pain and maximize return to PLOF.     Clinical Presentation  Stable    Clinical Presentation due to:  edema, ROM, MMT, hypomobility, soft tissue restritions, functional strength and mobility, balance, , SLS, 5xSTS    Clinical Decision Making  Low    Rehab Potential  Good    PT Frequency  3x / week    PT Duration  6 weeks    PT Treatment/Interventions  ADLs/Self Care Home  Management;Aquatic Therapy;Cryotherapy;Electrical Stimulation;Moist Heat;Traction;Biofeedback;Ultrasound;DME Instruction;Gait training;Stair training;Functional mobility training;Therapeutic activities;Therapeutic exercise;Balance training;Neuromuscular re-education;Patient/family education;Scar mobilization;Passive range of motion;Compression bandaging;Dry needling;Energy conservation;Taping    PT Next Visit Plan  review goals and HEP; focus on managing edema and improve AROM initially    PT Home Exercise Plan  eval: quad sets, heel slides    Consulted and Agree with Plan of Care  Patient       Patient will benefit from skilled therapeutic intervention in order to improve the following deficits and impairments:  Abnormal gait, Decreased activity tolerance, Decreased balance, Decreased mobility, Decreased range of motion, Decreased scar mobility, Decreased strength, Difficulty walking, Hypomobility, Increased edema, Increased fascial restricitons, Increased muscle spasms, Impaired flexibility, Improper body mechanics, Pain  Visit Diagnosis: Acute pain of left knee - Plan: PT plan of care cert/re-cert  Stiffness of left knee, not elsewhere classified - Plan: PT plan of care cert/re-cert  Localized edema - Plan: PT plan of care cert/re-cert  Muscle weakness (generalized) - Plan: PT plan of care cert/re-cert  Difficulty in walking, not elsewhere classified - Plan: PT plan of care cert/re-cert     Problem List Patient Active Problem List   Diagnosis Date Noted  . Primary osteoarthritis of knee 07/21/2018  . Status post unicompartmental knee replacement, left 07/21/2018  . Medicare annual wellness visit, initial 05/20/2018  . Effusion of knee joint (Left) 04/21/2018  . Opiate overdose (HCC) 03/24/2018  . Acute encephalopathy 03/24/2018  . Encephalopathy acute 03/24/2018  . Arthropathy of knee (Left) 01/12/2018  . Opioid-induced constipation (OIC) 01/12/2018  . Tinea corporis 01/07/2018   . Tinea versicolor 01/07/2018  . Pharmacologic therapy 12/08/2017  . Problems influencing health status 12/08/2017  . Long term prescription opiate use 12/08/2017  .  Opiate use 12/08/2017  . Insomnia 11/20/2017  . Disorder of skeletal system 11/05/2017  . Chronic knee pain (Primary Area of Pain) (Bilateral) (L>R) 11/05/2017  . Chronic wrist pain (Secondary Area of Pain) (Left) 11/05/2017  . Chronic constipation 08/18/2017  . Hyperlipidemia 05/12/2017  . COPD without exacerbation (HCC) 05/07/2017  . Male hypogonadism 04/24/2017  . Post-void dribbling 04/01/2017  . Osteoarthritis of the knee (Left)   . Skin lesions 03/12/2017  . GERD (gastroesophageal reflux disease) 03/12/2017  . Bucket handle tear of meniscus of knee (Right)   . Anxiety and depression 10/07/2015  . Morbid obesity (HCC) 10/06/2015  . OSA (obstructive sleep apnea) 08/31/2014  . Testosterone deficiency 07/01/2014  . S/P knee surgery 04/26/2014  . Meniscal tear of knee, sequela (Left) 04/26/2014  . Medial meniscus, posterior horn derangement 04/22/2014  . Intrinsic asthma 12/19/2013  . Chronic pain syndrome 05/13/2013  . Diabetes (HCC) 05/13/2013  . Mediastinal abnormality 04/10/2012  . Cigarette smoker 04/10/2012       Jac CanavanBrooke Powell PT, DPT  [facility] [address] [place], [place], [address] Phone: [phone]   Fax:  [phone]  Name: Joseph [name] MRN: [id number] Date of Birth: 1971-03-09

## 2018-07-27 ENCOUNTER — Ambulatory Visit (HOSPITAL_COMMUNITY): Payer: Medicare Other | Admitting: Physical Therapy

## 2018-07-27 DIAGNOSIS — M25662 Stiffness of left knee, not elsewhere classified: Secondary | ICD-10-CM

## 2018-07-27 DIAGNOSIS — M25562 Pain in left knee: Secondary | ICD-10-CM | POA: Diagnosis not present

## 2018-07-27 DIAGNOSIS — R600 Localized edema: Secondary | ICD-10-CM

## 2018-07-27 DIAGNOSIS — M6281 Muscle weakness (generalized): Secondary | ICD-10-CM | POA: Diagnosis not present

## 2018-07-27 DIAGNOSIS — R262 Difficulty in walking, not elsewhere classified: Secondary | ICD-10-CM | POA: Diagnosis not present

## 2018-07-27 NOTE — Therapy (Signed)
[facility] [address] [place], [place], [address] Phone: [phone]   Fax:  [phone]  Physical Therapy Treatment  Patient Details  Name: Joseph [name] MRN: [id number] Date of Birth: 1971-01-06 Referring Provider: Michell Heinrichimorhty Murphy, MD   Encounter Date: 07/27/2018  PT End of Session - 07/27/18 1717    Visit Number  2    Number of Visits  19    Date for PT Re-Evaluation  09/03/18   mini reassess on 08/13/18   Authorization Type  Medicare    Authorization Time Period  07/23/18 to 09/03/18    Authorization - Visit Number  2    Authorization - Number of Visits  10    PT Start Time  1649    PT Stop Time  1730    PT Time Calculation (min)  41 min    Activity Tolerance  Patient tolerated treatment well;Patient limited by pain    Behavior During Therapy  [facility] assessed/performed       Past Medical History:  Diagnosis Date  . Anxiety   . Arthritis    oa left knee and lower back  . Asthma   . Cataract    hx of bilateral  . Chronic back pain   . Chronic pain of left knee   . COPD (chronic obstructive pulmonary disease) (HCC)   . Diabetes mellitus    type 2  . GERD (gastroesophageal reflux disease)   . INGUINAL PAIN, LEFT 10/03/2009   Qualifier: Diagnosis of  By: Darrick PennaFields MD, Sandi L   . Overdose    03-24-18 accidental  . Shortness of breath    with heavy exertion  . Sleep apnea     Past Surgical History:  Procedure Laterality Date  . CARPAL TUNNEL RELEASE Left   . CATARACT EXTRACTION W/PHACO Right 03/11/2016   Procedure: CATARACT EXTRACTION PHACO AND INTRAOCULAR LENS PLACEMENT (IOC);  Surgeon: Gemma PayorKerry Hunt, MD;  Location: [facility];  Service: Ophthalmology;  Laterality: Right;  CDE 4.24  . EYE SURGERY Right 2018   ioc with lens replacement  . HERNIA REPAIR Bilateral 2010   umbilical  . KNEE ARTHROSCOPY WITH MEDIAL MENISECTOMY Left 04/22/2014   Procedure: LEFT KNEE ARTHROSCOPY WITH MEDIAL MENISECTOMY;  Surgeon: Vickki HearingStanley E  Harrison, MD;  Location: [facility];  Service: Orthopedics;  Laterality: Left;  . KNEE ARTHROSCOPY WITH MEDIAL MENISECTOMY Right 12/06/2015   Procedure: RIGHT KNEE ARTHROSCOPY WITH MEDIAL MENISECTOMY;  Surgeon: Vickki HearingStanley E Harrison, MD;  Location: [facility];  Service: Orthopedics;  Laterality: Right;  . KNEE ARTHROSCOPY WITH MEDIAL MENISECTOMY Left 03/20/2017   Procedure: KNEE ARTHROSCOPY WITH MEDIAL MENISECTOMY;  Surgeon: Vickki HearingStanley E Harrison, MD;  Location: [facility];  Service: Orthopedics;  Laterality: Left;  . ORIF WRIST FRACTURE Left 12/19/2016   Procedure: OPEN REDUCTION INTERNAL FIXATION (ORIF) WRIST FRACTURE;  Surgeon: Betha LoaKevin Kuzma, MD;  Location: [facility];  Service: Orthopedics;  Laterality: Left;  accumed   . PARTIAL KNEE ARTHROPLASTY Left 07/21/2018   Procedure: LEFT UNICOMPARTMENTAL KNEE;  Surgeon: Sheral ApleyMurphy, Joseph D, MD;  Location: [facility];  Service: Orthopedics;  Laterality: Left;  . SHOULDER ARTHROCENTESIS Right 2008 or 2010  . VASECTOMY  2014    There were no vitals filed for this visit.  Subjective Assessment - 07/27/18 1739    Subjective  PT reports he just has tightness and heaviness and really the pain is only there when he's trying to bend it.  states it feels so tight like it  might pop.     Currently in Pain?  Yes    Pain Score  7     Pain Location  Knee    Pain Orientation  Left    Pain Descriptors / Indicators  Tightness                       [facility] Adult PT Treatment/Exercise - 07/27/18 0001      Ambulation/Gait   Gait Comments  gait with SPC      Knee/Hip Exercises: Stretches   Active Hamstring Stretch  Left;3 reps;30 seconds;Limitations    Active Hamstring Stretch Limitations  12" box    Knee: Self-Stretch to increase Flexion  Left;10 seconds;Limitations    Knee: Self-Stretch Limitations  10 reps on 12" box    Other Knee/Hip Stretches  slant board 3X30"      Knee/Hip Exercises: Supine   Quad Sets  Left;10 reps    Heel Slides  Left;5 reps     Knee Extension  Left;Limitations    Knee Extension Limitations  7    Knee Flexion  Left;Limitations    Knee Flexion Limitations  97      Manual Therapy   Manual Therapy  Edema management    Manual therapy comments  completed seperately from all other skilled interventions    Edema Management  Lt knee with elevation             PT Education - 07/27/18 1716    Education Details  reviewed evaluation and goals and HEP.    Person(s) Educated  Patient    Methods  Explanation;Demonstration;Tactile cues;Verbal cues;Handout    Comprehension  Verbalized understanding;Need further instruction;Verbal cues required;Returned demonstration;Tactile cues required       PT Short Term Goals - 07/23/18 1210      PT SHORT TERM GOAL #1   Title  Pt will have decreased L knee edema by 3cm or > in order to decrease pain and improve ROM.     Time  3    Period  Weeks    Status  New    Target Date  08/13/18      PT SHORT TERM GOAL #2   Title  Pt will have improved L knee AROM from 10-95deg in order to decrease pain and maximize gait.    Time  3    Period  Weeks    Status  New      PT SHORT TERM GOAL #3   Title  Pt will have 1/2 grade improvement throughout MMT in order to decrease pain and maximize gait.     Time  3    Period  Weeks    Status  New      PT SHORT TERM GOAL #4   Title  Pt will be able to perform 5xSTS in 15 sec or < without UE and with proper mechanics to demo improved balance and functional strength.    Time  3    Period  Weeks    Status  New        PT Long Term Goals - 07/23/18 1211      PT LONG TERM GOAL #1   Title  Pt will have improved L knee AROM from 0-115deg in order to further decrease pain and maximize stair ambulation.    Time  6    Period  Weeks    Status  New    Target Date  09/03/18      PT  LONG TERM GOAL #2   Title  Pt will have 1 grade improvement throughout MMT in order to further decrease pain and maximize overall functional mobility.    Time  6     Period  Weeks    Status  New      PT LONG TERM GOAL #3   Title  Pt will be able to ambulate 546ft or > during with LRAD and gait WFL in order to demo improved functional mobility and maximize community access.    Time  6    Period  Weeks    Status  New      PT LONG TERM GOAL #4   Title  Pt will be able to perform L SLS for 15 sec without UE support in order to demo improved balance and maximize his gait on uneven ground and stair ambulation.    Time  6    Period  Weeks    Status  New            Plan - 07/27/18 1733    Clinical Impression Statement  Reveiwed evaluation goals and HEP as established at first visit.  Began gait using SPC with good sequencing, cadence and no pain reported.  Pt with most complaints of edema which makes movement stiff and prevents good ROM.  Added standing stretches today and began edema massage to begin to move fluid.  Encourged to continue icing and elevation.  ROM today improved at 7-97 degrees, was 18-55 last session.  Pain remained unchanged.     Rehab Potential  Good    PT Frequency  3x / week    PT Duration  6 weeks    PT Treatment/Interventions  ADLs/Self Care Home Management;Aquatic Therapy;Cryotherapy;Electrical Stimulation;Moist Heat;Traction;Biofeedback;Ultrasound;DME Instruction;Gait training;Stair training;Functional mobility training;Therapeutic activities;Therapeutic exercise;Balance training;Neuromuscular re-education;Patient/family education;Scar mobilization;Passive range of motion;Compression bandaging;Dry needling;Energy conservation;Taping    PT Next Visit Plan  Continue with focus on managing edema and improve AROM initially    PT Home Exercise Plan  eval: quad sets, heel slides    Consulted and Agree with Plan of Care  Patient       Patient will benefit from skilled therapeutic intervention in order to improve the following deficits and impairments:  Abnormal gait, Decreased activity tolerance, Decreased balance, Decreased  mobility, Decreased range of motion, Decreased scar mobility, Decreased strength, Difficulty walking, Hypomobility, Increased edema, Increased fascial restricitons, Increased muscle spasms, Impaired flexibility, Improper body mechanics, Pain  Visit Diagnosis: Acute pain of left knee  Stiffness of left knee, not elsewhere classified  Localized edema  Muscle weakness (generalized)  Difficulty in walking, not elsewhere classified     Problem List Patient Active Problem List   Diagnosis Date Noted  . Primary osteoarthritis of knee 07/21/2018  . Status post unicompartmental knee replacement, left 07/21/2018  . Medicare annual wellness visit, initial 05/20/2018  . Effusion of knee joint (Left) 04/21/2018  . Opiate overdose (HCC) 03/24/2018  . Acute encephalopathy 03/24/2018  . Encephalopathy acute 03/24/2018  . Arthropathy of knee (Left) 01/12/2018  . Opioid-induced constipation (OIC) 01/12/2018  . Tinea corporis 01/07/2018  . Tinea versicolor 01/07/2018  . Pharmacologic therapy 12/08/2017  . Problems influencing health status 12/08/2017  . Long term prescription opiate use 12/08/2017  . Opiate use 12/08/2017  . Insomnia 11/20/2017  . Disorder of skeletal system 11/05/2017  . Chronic knee pain (Primary Area of Pain) (Bilateral) (L>R) 11/05/2017  . Chronic wrist pain (Secondary Area of Pain) (Left) 11/05/2017  . Chronic constipation 08/18/2017  .  Hyperlipidemia 05/12/2017  . COPD without exacerbation (HCC) 05/07/2017  . Male hypogonadism 04/24/2017  . Post-void dribbling 04/01/2017  . Osteoarthritis of the knee (Left)   . Skin lesions 03/12/2017  . GERD (gastroesophageal reflux disease) 03/12/2017  . Bucket handle tear of meniscus of knee (Right)   . Anxiety and depression 10/07/2015  . Morbid obesity (HCC) 10/06/2015  . OSA (obstructive sleep apnea) 08/31/2014  . Testosterone deficiency 07/01/2014  . S/P knee surgery 04/26/2014  . Meniscal tear of knee, sequela (Left)  04/26/2014  . Medial meniscus, posterior horn derangement 04/22/2014  . Intrinsic asthma 12/19/2013  . Chronic pain syndrome 05/13/2013  . Diabetes (HCC) 05/13/2013  . Mediastinal abnormality 04/10/2012  . Cigarette smoker 04/10/2012   Lurena Nida, PTA/CLT [phone]  Lurena Nida 07/27/2018, 5:40 PM  [facility] [address] [place], [place], [address] Phone: [phone]   Fax:  [phone]  Name: Joseph [name] MRN: [id number] Date of Birth: 1971-01-10

## 2018-07-29 ENCOUNTER — Encounter (HOSPITAL_COMMUNITY): Payer: Self-pay

## 2018-07-29 ENCOUNTER — Ambulatory Visit (HOSPITAL_COMMUNITY): Payer: Medicare Other

## 2018-07-29 DIAGNOSIS — M6281 Muscle weakness (generalized): Secondary | ICD-10-CM | POA: Diagnosis not present

## 2018-07-29 DIAGNOSIS — R262 Difficulty in walking, not elsewhere classified: Secondary | ICD-10-CM

## 2018-07-29 DIAGNOSIS — R600 Localized edema: Secondary | ICD-10-CM

## 2018-07-29 DIAGNOSIS — M25662 Stiffness of left knee, not elsewhere classified: Secondary | ICD-10-CM | POA: Diagnosis not present

## 2018-07-29 DIAGNOSIS — M25562 Pain in left knee: Secondary | ICD-10-CM | POA: Diagnosis not present

## 2018-07-29 NOTE — Therapy (Signed)
[facility] [address] [place], [place], [address] Phone: [phone]   Fax:  [phone]  Physical Therapy Treatment  Patient Details  Name: Joseph [name] MRN: [id number] Date of Birth: [birth year] Referring Provider: Margarita Rana, MD   Encounter Date: 07/29/2018  PT End of Session - 07/29/18 1740    Visit Number  3    Number of Visits  19    Date for PT Re-Evaluation  09/03/18   minireassess 08/13/18   Authorization Type  Medicare    Authorization Time Period  07/23/18 to 09/03/18    Authorization - Visit Number  3    Authorization - Number of Visits  10    PT Start Time  1734    PT Stop Time  1815    PT Time Calculation (min)  41 min    Activity Tolerance  Patient tolerated treatment well;Patient limited by pain    Behavior During Therapy  [facility] for tasks assessed/performed       Past Medical History:  Diagnosis Date  . Anxiety   . Arthritis    oa left knee and lower back  . Asthma   . Cataract    hx of bilateral  . Chronic back pain   . Chronic pain of left knee   . COPD (chronic obstructive pulmonary disease) (HCC)   . Diabetes mellitus    type 2  . GERD (gastroesophageal reflux disease)   . INGUINAL PAIN, LEFT 10/03/2009   Qualifier: Diagnosis of  By: Darrick Penna MD, Sandi L   . Overdose    03-24-18 accidental  . Shortness of breath    with heavy exertion  . Sleep apnea     Past Surgical History:  Procedure Laterality Date  . CARPAL TUNNEL RELEASE Left   . CATARACT EXTRACTION W/PHACO Right 03/11/2016   Procedure: CATARACT EXTRACTION PHACO AND INTRAOCULAR LENS PLACEMENT (IOC);  Surgeon: Gemma Payor, MD;  Location: [facility];  Service: Ophthalmology;  Laterality: Right;  CDE 4.24  . EYE SURGERY Right 2018   ioc with lens replacement  . HERNIA REPAIR Bilateral 2010   umbilical  . KNEE ARTHROSCOPY WITH MEDIAL MENISECTOMY Left 04/22/2014   Procedure: LEFT KNEE ARTHROSCOPY WITH MEDIAL MENISECTOMY;  Surgeon: Vickki Hearing, MD;  Location: [facility];  Service: Orthopedics;  Laterality: Left;  . KNEE ARTHROSCOPY WITH MEDIAL MENISECTOMY Right 12/06/2015   Procedure: RIGHT KNEE ARTHROSCOPY WITH MEDIAL MENISECTOMY;  Surgeon: Vickki Hearing, MD;  Location: [facility];  Service: Orthopedics;  Laterality: Right;  . KNEE ARTHROSCOPY WITH MEDIAL MENISECTOMY Left 03/20/2017   Procedure: KNEE ARTHROSCOPY WITH MEDIAL MENISECTOMY;  Surgeon: Vickki Hearing, MD;  Location: [facility];  Service: Orthopedics;  Laterality: Left;  . ORIF WRIST FRACTURE Left 12/19/2016   Procedure: OPEN REDUCTION INTERNAL FIXATION (ORIF) WRIST FRACTURE;  Surgeon: Betha Loa, MD;  [facility];  Service: Orthopedics;  Laterality: Left;  accumed   . PARTIAL KNEE ARTHROPLASTY Left 07/21/2018   Procedure: LEFT UNICOMPARTMENTAL KNEE;  Surgeon: Sheral Apley, MD;  Location: [facility];  Service: Orthopedics;  Laterality: Left;  . SHOULDER ARTHROCENTESIS Right 2008 or 2010  . VASECTOMY  2014    There were no vitals filed for this visit.  Subjective Assessment - 07/29/18 1736    Subjective  Pt stated main problem is swelling, current pain scale 5/10.    Patient Stated Goals  get L knee fixed/back to normal    Currently in Pain?  Yes  Pain Score  5     Pain Location  Knee    Pain Orientation  Left    Pain Descriptors / Indicators  Tightness   swelling   Pain Type  Surgical pain    Pain Onset  In the past 7 days    Pain Frequency  Constant    Aggravating Factors   bending, WB activities, turning wrong way    Pain Relieving Factors  ice, rest    Effect of Pain on Daily Activities  increased         [facility] PT Assessment - 07/29/18 0001      Assessment   Medical Diagnosis  L partial TKA    Referring Provider  Margarita Ranaimothy Murphy, MD    Onset Date/Surgical Date  07/21/18    Next MD Visit  08/05/2018    Prior Therapy  yes for R shoulder (2008), none for present issue                   [facility] Adult PT  Treatment/Exercise - 07/29/18 0001      Exercises   Exercises  Knee/Hip      Knee/Hip Exercises: Stretches   Active Hamstring Stretch  Left;3 reps;30 seconds;Limitations    Active Hamstring Stretch Limitations  12" box    Knee: Self-Stretch to increase Flexion  Left;10 seconds;Limitations    Knee: Self-Stretch Limitations  10 reps on 12" box      Knee/Hip Exercises: Standing   Heel Raises  Both;10 reps      Knee/Hip Exercises: Seated   Long Arc Quad  Left;10 reps      Knee/Hip Exercises: Supine   Quad Sets  Left;10 reps    Short Arc Quad Sets  5 reps;Limitations    Short Arc Quad Sets Limitations  limited by pain    Heel Slides  Left;10 reps    Knee Extension  Left;Limitations    Knee Extension Limitations  5 was 7    Knee Flexion  Left;Limitations    Knee Flexion Limitations  115 was 97      Manual Therapy   Manual Therapy  Edema management    Manual therapy comments  completed seperately from all other skilled interventions    Edema Management  Lt knee with elevation               PT Short Term Goals - 07/23/18 1210      PT SHORT TERM GOAL #1   Title  Pt will have decreased L knee edema by 3cm or > in order to decrease pain and improve ROM.     Time  3    Period  Weeks    Status  New    Target Date  08/13/18      PT SHORT TERM GOAL #2   Title  Pt will have improved L knee AROM from 10-95deg in order to decrease pain and maximize gait.    Time  3    Period  Weeks    Status  New      PT SHORT TERM GOAL #3   Title  Pt will have 1/2 grade improvement throughout MMT in order to decrease pain and maximize gait.     Time  3    Period  Weeks    Status  New      PT SHORT TERM GOAL #4   Title  Pt will be able to perform 5xSTS in 15 sec or < without UE and with  proper mechanics to demo improved balance and functional strength.    Time  3    Period  Weeks    Status  New        PT Long Term Goals - 07/23/18 1211      PT LONG TERM GOAL #1   Title  Pt  will have improved L knee AROM from 0-115deg in order to further decrease pain and maximize stair ambulation.    Time  6    Period  Weeks    Status  New    Target Date  09/03/18      PT LONG TERM GOAL #2   Title  Pt will have 1 grade improvement throughout MMT in order to further decrease pain and maximize overall functional mobility.    Time  6    Period  Weeks    Status  New      PT LONG TERM GOAL #3   Title  Pt will be able to ambulate 570ft or > during with LRAD and gait WFL in order to demo improved functional mobility and maximize community access.    Time  6    Period  Weeks    Status  New      PT LONG TERM GOAL #4   Title  Pt will be able to perform L SLS for 15 sec without UE support in order to demo improved balance and maximize his gait on uneven ground and stair ambulation.    Time  6    Period  Weeks    Status  New            Plan - 07/29/18 1823    Clinical Impression Statement  Continued session focus wiht knee mobility and instructed for proper hand use with SPC for gait mechanics and pain control.  Added heel/toe raises as strengthening for gait training with minimal difficulty.  Pt is progressing well with improved AROM to 5-116 degrees today (was 7-97 degrees last session).  EOS with manual retromassage for edema control.  Pt reports some pain with SAQ, held this session but do resume next session.  Reviewed RICE techniques for pain and edema control.      Rehab Potential  Good    PT Frequency  3x / week    PT Duration  6 weeks    PT Treatment/Interventions  ADLs/Self Care Home Management;Aquatic Therapy;Cryotherapy;Electrical Stimulation;Moist Heat;Traction;Biofeedback;Ultrasound;DME Instruction;Gait training;Stair training;Functional mobility training;Therapeutic activities;Therapeutic exercise;Balance training;Neuromuscular re-education;Patient/family education;Scar mobilization;Passive range of motion;Compression bandaging;Dry needling;Energy  conservation;Taping    PT Next Visit Plan  Complete FOTO next session.  Continue with focus on managing edema and improve AROM initially.  Add rockerboard and standing TKE next session.      PT Home Exercise Plan  eval: quad sets, heel slides       Patient will benefit from skilled therapeutic intervention in order to improve the following deficits and impairments:  Abnormal gait, Decreased activity tolerance, Decreased balance, Decreased mobility, Decreased range of motion, Decreased scar mobility, Decreased strength, Difficulty walking, Hypomobility, Increased edema, Increased fascial restricitons, Increased muscle spasms, Impaired flexibility, Improper body mechanics, Pain  Visit Diagnosis: Acute pain of left knee  Stiffness of left knee, not elsewhere classified  Localized edema  Muscle weakness (generalized)  Difficulty in walking, not elsewhere classified     Problem List Patient Active Problem List   Diagnosis Date Noted  . Primary osteoarthritis of knee 07/21/2018  . Status post unicompartmental knee replacement, left 07/21/2018  . Medicare  annual wellness visit, initial 05/20/2018  . Effusion of knee joint (Left) 04/21/2018  . Opiate overdose (HCC) 03/24/2018  . Acute encephalopathy 03/24/2018  . Encephalopathy acute 03/24/2018  . Arthropathy of knee (Left) 01/12/2018  . Opioid-induced constipation (OIC) 01/12/2018  . Tinea corporis 01/07/2018  . Tinea versicolor 01/07/2018  . Pharmacologic therapy 12/08/2017  . Problems influencing health status 12/08/2017  . Long term prescription opiate use 12/08/2017  . Opiate use 12/08/2017  . Insomnia 11/20/2017  . Disorder of skeletal system 11/05/2017  . Chronic knee pain (Primary Area of Pain) (Bilateral) (L>R) 11/05/2017  . Chronic wrist pain (Secondary Area of Pain) (Left) 11/05/2017  . Chronic constipation 08/18/2017  . Hyperlipidemia 05/12/2017  . COPD without exacerbation (HCC) 05/07/2017  . Male hypogonadism  04/24/2017  . Post-void dribbling 04/01/2017  . Osteoarthritis of the knee (Left)   . Skin lesions 03/12/2017  . GERD (gastroesophageal reflux disease) 03/12/2017  . Bucket handle tear of meniscus of knee (Right)   . Anxiety and depression 10/07/2015  . Morbid obesity (HCC) 10/06/2015  . OSA (obstructive sleep apnea) 08/31/2014  . Testosterone deficiency 07/01/2014  . S/P knee surgery 04/26/2014  . Meniscal tear of knee, sequela (Left) 04/26/2014  . Medial meniscus, posterior horn derangement 04/22/2014  . Intrinsic asthma 12/19/2013  . Chronic pain syndrome 05/13/2013  . Diabetes (HCC) 05/13/2013  . Mediastinal abnormality 04/10/2012  . Cigarette smoker 04/10/2012   Becky Sax, LPTA; CBIS [phone]  Juel Burrow 07/29/2018, 6:29 PM  [facility] [address] [place], [place], [address] Phone: [phone]   Fax:  [phone]  Name: Joseph [name] MRN: [id number] Date of Birth: 1971-09-23

## 2018-07-31 ENCOUNTER — Ambulatory Visit (HOSPITAL_COMMUNITY): Payer: Medicare Other

## 2018-07-31 ENCOUNTER — Telehealth (HOSPITAL_COMMUNITY): Payer: Self-pay

## 2018-07-31 NOTE — Telephone Encounter (Signed)
Patient had to cancel for today he had a family emergency

## 2018-08-04 ENCOUNTER — Ambulatory Visit (HOSPITAL_COMMUNITY): Payer: Medicare Other | Attending: Orthopedic Surgery

## 2018-08-04 ENCOUNTER — Encounter (HOSPITAL_COMMUNITY): Payer: Self-pay

## 2018-08-04 DIAGNOSIS — R600 Localized edema: Secondary | ICD-10-CM | POA: Diagnosis not present

## 2018-08-04 DIAGNOSIS — R262 Difficulty in walking, not elsewhere classified: Secondary | ICD-10-CM | POA: Diagnosis not present

## 2018-08-04 DIAGNOSIS — M6281 Muscle weakness (generalized): Secondary | ICD-10-CM | POA: Insufficient documentation

## 2018-08-04 DIAGNOSIS — M25562 Pain in left knee: Secondary | ICD-10-CM | POA: Diagnosis not present

## 2018-08-04 DIAGNOSIS — M25662 Stiffness of left knee, not elsewhere classified: Secondary | ICD-10-CM | POA: Diagnosis not present

## 2018-08-04 NOTE — Therapy (Signed)
[facility] [address] [place], [place], [address] Phone: [phone]   Fax:  [phone]  Physical Therapy Treatment # OF FEET WALKED: ambulated with [facility] through session ROM:  Flexion: 124 degrees            Extension: 5 degrees  Patient Details  Name: Joseph [name] MRN: [id number] Date of Birth: September 20, 1971 Referring Provider: Margarita Rana, MD   Encounter Date: 08/04/2018  PT End of Session - 08/04/18 1741    Visit Number  4    Number of Visits  19    Date for PT Re-Evaluation  09/03/18   minireassess 08/13/18   Authorization Type  Medicare    Authorization Time Period  07/23/18 to 09/03/18    Authorization - Visit Number  4    Authorization - Number of Visits  10    PT Start Time  1736   4' on bike, not included wiht charges   PT Stop Time  1820    PT Time Calculation (min)  44 min    Activity Tolerance  Patient tolerated treatment well;Patient limited by pain    Behavior During Therapy  [facility] for tasks assessed/performed       Past Medical History:  Diagnosis Date  . Anxiety   . Arthritis    oa left knee and lower back  . Asthma   . Cataract    hx of bilateral  . Chronic back pain   . Chronic pain of left knee   . COPD (chronic obstructive pulmonary disease) (HCC)   . Diabetes mellitus    type 2  . GERD (gastroesophageal reflux disease)   . INGUINAL PAIN, LEFT 10/03/2009   Qualifier: Diagnosis of  By: Darrick Penna MD, Sandi L   . Overdose    03-24-18 accidental  . Shortness of breath    with heavy exertion  . Sleep apnea     Past Surgical History:  Procedure Laterality Date  . CARPAL TUNNEL RELEASE Left   . CATARACT EXTRACTION W/PHACO Right 03/11/2016   Procedure: CATARACT EXTRACTION PHACO AND INTRAOCULAR LENS PLACEMENT (IOC);  Surgeon: Gemma Payor, MD;  Location: [facility];  Service: Ophthalmology;  Laterality: Right;  CDE 4.24  . EYE SURGERY Right 2018   ioc with lens replacement  . HERNIA REPAIR Bilateral 2010   umbilical  . KNEE ARTHROSCOPY WITH MEDIAL MENISECTOMY Left 04/22/2014   Procedure: LEFT KNEE ARTHROSCOPY WITH MEDIAL MENISECTOMY;  Surgeon: Vickki Hearing, MD;  Location: [facility];  Service: Orthopedics;  Laterality: Left;  . KNEE ARTHROSCOPY WITH MEDIAL MENISECTOMY Right 12/06/2015   Procedure: RIGHT KNEE ARTHROSCOPY WITH MEDIAL MENISECTOMY;  Surgeon: Vickki Hearing, MD;  Location: [facility];  Service: Orthopedics;  Laterality: Right;  . KNEE ARTHROSCOPY WITH MEDIAL MENISECTOMY Left 03/20/2017   Procedure: KNEE ARTHROSCOPY WITH MEDIAL MENISECTOMY;  Surgeon: Vickki Hearing, MD;  Location: [facility];  Service: Orthopedics;  Laterality: Left;  . ORIF WRIST FRACTURE Left 12/19/2016   Procedure: OPEN REDUCTION INTERNAL FIXATION (ORIF) WRIST FRACTURE;  Surgeon: Betha Loa, MD;  Location: [facility];  Service: Orthopedics;  Laterality: Left;  accumed   . PARTIAL KNEE ARTHROPLASTY Left 07/21/2018   Procedure: LEFT UNICOMPARTMENTAL KNEE;  Surgeon: Sheral Apley, MD;  Location: [facility];  Service: Orthopedics;  Laterality: Left;  . SHOULDER ARTHROCENTESIS Right 2008 or 2010  . VASECTOMY  2014    There were no vitals filed for this visit.  Subjective Assessment - 08/04/18 1739  Subjective  Pt stated he woke up almost in tears this morning at 5am, current pain scale 9/10 throbbing constant pain.  Reports MD apt tomorrow to remove dressings    Patient Stated Goals  get L knee fixed/back to normal    Currently in Pain?  Yes    Pain Score  9     Pain Location  Knee    Pain Orientation  Left    Pain Descriptors / Indicators  Throbbing    Pain Type  Surgical pain    Pain Onset  In the past 7 days    Pain Frequency  Constant    Aggravating Factors   bending, WB activities, turning wrong way    Pain Relieving Factors  ice,rest    Effect of Pain on Daily Activities  increased         [facility] PT Assessment - 08/04/18 0001      Assessment   Medical Diagnosis  L partial TKA     Referring Provider  Margarita Rana, MD    Onset Date/Surgical Date  07/21/18    Next MD Visit  08/05/2018    Prior Therapy  yes for R shoulder (2008), none for present issue      Observation/Other Assessments   Focus on Therapeutic Outcomes (FOTO)   46.17%      AROM   AROM Assessment Site  Knee    Right/Left Knee  Left    Left Knee Extension  5   was 18   Left Knee Flexion  124   was 55                  [facility] Adult PT Treatment/Exercise - 08/04/18 0001      Exercises   Exercises  Knee/Hip      Knee/Hip Exercises: Stretches   Active Hamstring Stretch  Left;3 reps;30 seconds;Limitations    Active Hamstring Stretch Limitations  supine wiht belt    Knee: Self-Stretch to increase Flexion  Left;10 seconds;Limitations    Knee: Self-Stretch Limitations  10 reps on 12" box    Other Knee/Hip Stretches  slant board 3X30"      Knee/Hip Exercises: Aerobic   Stationary Bike  seat 15 x 4' for mobility      Knee/Hip Exercises: Supine   Quad Sets  Left;10 reps    Short Arc Quad Sets  10 reps    Heel Slides  Left;10 reps    Knee Extension  Left;Limitations    Knee Extension Limitations  5 was 7    Knee Flexion  Left;Limitations    Knee Flexion Limitations  124 was 115      Manual Therapy   Manual Therapy  Edema management;Joint mobilization    Manual therapy comments  completed seperately from all other skilled interventions    Edema Management  Lt knee with elevation    Joint Mobilization  patella mobs all directions, good mobility noted               PT Short Term Goals - 08/04/18 1821      PT SHORT TERM GOAL #1   Title  Pt will have decreased L knee edema by 3cm or > in order to decrease pain and improve ROM.     Status  On-going      PT SHORT TERM GOAL #2   Title  Pt will have improved L knee AROM from 10-95deg in order to decrease pain and maximize gait.    Baseline  08/04/18:  AROM 5-124 degrees     Status  Achieved      PT SHORT TERM GOAL #3   Title   Pt will have 1/2 grade improvement throughout MMT in order to decrease pain and maximize gait.       PT SHORT TERM GOAL #4   Title  Pt will be able to perform 5xSTS in 15 sec or < without UE and with proper mechanics to demo improved balance and functional strength.        PT Long Term Goals - 07/23/18 1211      PT LONG TERM GOAL #1   Title  Pt will have improved L knee AROM from 0-115deg in order to further decrease pain and maximize stair ambulation.    Time  6    Period  Weeks    Status  New    Target Date  09/03/18      PT LONG TERM GOAL #2   Title  Pt will have 1 grade improvement throughout MMT in order to further decrease pain and maximize overall functional mobility.    Time  6    Period  Weeks    Status  New      PT LONG TERM GOAL #3   Title  Pt will be able to ambulate 538ft or > during with LRAD and gait WFL in order to demo improved functional mobility and maximize community access.    Time  6    Period  Weeks    Status  New      PT LONG TERM GOAL #4   Title  Pt will be able to perform L SLS for 15 sec without UE support in order to demo improved balance and maximize his gait on uneven ground and stair ambulation.    Time  6    Period  Weeks    Status  New            Plan - 08/04/18 1753    Clinical Impression Statement  Continued session foucs with mobility and pain control.  Begn session on bike for mobility, pt able to make full revolution seat 15.  Pt limited by higher pain scale today so limited weight bearing activities.  Knee mobility is improving fast with AROM at 5-124 degrees today (was 5-116 last session.)  EOS wiht manual techniques to address edema proximal knee and patella mobs for mobility.  Patella with good mobility noted.  EOS pt reports pain reduced to 8/10.      Rehab Potential  Good    PT Frequency  3x / week    PT Duration  6 weeks    PT Treatment/Interventions  ADLs/Self Care Home Management;Aquatic Therapy;Cryotherapy;Electrical  Stimulation;Moist Heat;Traction;Biofeedback;Ultrasound;DME Instruction;Gait training;Stair training;Functional mobility training;Therapeutic activities;Therapeutic exercise;Balance training;Neuromuscular re-education;Patient/family education;Scar mobilization;Passive range of motion;Compression bandaging;Dry needling;Energy conservation;Taping    PT Next Visit Plan  Continue with focus on managing edema and improve AROM initially.  Add rockerboard and standing TKE next session.      PT Home Exercise Plan  eval: quad sets, heel slides       Patient will benefit from skilled therapeutic intervention in order to improve the following deficits and impairments:  Abnormal gait, Decreased activity tolerance, Decreased balance, Decreased mobility, Decreased range of motion, Decreased scar mobility, Decreased strength, Difficulty walking, Hypomobility, Increased edema, Increased fascial restricitons, Increased muscle spasms, Impaired flexibility, Improper body mechanics, Pain  Visit Diagnosis: Acute pain of left knee  Stiffness of left knee, not elsewhere classified  Localized edema  Muscle weakness (generalized)  Difficulty in walking, not elsewhere classified     Problem List Patient Active Problem List   Diagnosis Date Noted  . Primary osteoarthritis of knee 07/21/2018  . Status post unicompartmental knee replacement, left 07/21/2018  . Medicare annual wellness visit, initial 05/20/2018  . Effusion of knee joint (Left) 04/21/2018  . Opiate overdose (HCC) 03/24/2018  . Acute encephalopathy 03/24/2018  . Encephalopathy acute 03/24/2018  . Arthropathy of knee (Left) 01/12/2018  . Opioid-induced constipation (OIC) 01/12/2018  . Tinea corporis 01/07/2018  . Tinea versicolor 01/07/2018  . Pharmacologic therapy 12/08/2017  . Problems influencing health status 12/08/2017  . Long term prescription opiate use 12/08/2017  . Opiate use 12/08/2017  . Insomnia 11/20/2017  . Disorder of skeletal  system 11/05/2017  . Chronic knee pain (Primary Area of Pain) (Bilateral) (L>R) 11/05/2017  . Chronic wrist pain (Secondary Area of Pain) (Left) 11/05/2017  . Chronic constipation 08/18/2017  . Hyperlipidemia 05/12/2017  . COPD without exacerbation (HCC) 05/07/2017  . Male hypogonadism 04/24/2017  . Post-void dribbling 04/01/2017  . Osteoarthritis of the knee (Left)   . Skin lesions 03/12/2017  . GERD (gastroesophageal reflux disease) 03/12/2017  . Bucket handle tear of meniscus of knee (Right)   . Anxiety and depression 10/07/2015  . Morbid obesity (HCC) 10/06/2015  . OSA (obstructive sleep apnea) 08/31/2014  . Testosterone deficiency 07/01/2014  . S/P knee surgery 04/26/2014  . Meniscal tear of knee, sequela (Left) 04/26/2014  . Medial meniscus, posterior horn derangement 04/22/2014  . Intrinsic asthma 12/19/2013  . Chronic pain syndrome 05/13/2013  . Diabetes (HCC) 05/13/2013  . Mediastinal abnormality 04/10/2012  . Cigarette smoker 04/10/2012   Becky Sax, LPTA; CBIS [phone]  Juel Burrow 08/04/2018, 6:27 PM  [facility] [address] [place], [place], [address] Phone: [phone]   Fax:  [phone]  Name: Joseph [name] MRN: [id number] Date of Birth: 1971/07/06

## 2018-08-05 ENCOUNTER — Ambulatory Visit (HOSPITAL_COMMUNITY): Payer: Medicare Other

## 2018-08-05 DIAGNOSIS — M1712 Unilateral primary osteoarthritis, left knee: Secondary | ICD-10-CM | POA: Diagnosis not present

## 2018-08-06 ENCOUNTER — Ambulatory Visit (HOSPITAL_COMMUNITY): Payer: Medicare Other

## 2018-08-06 ENCOUNTER — Telehealth (HOSPITAL_COMMUNITY): Payer: Self-pay

## 2018-08-06 NOTE — Telephone Encounter (Signed)
No Show #1: I called the patient at his home phone number on file to check in with him since he did not arrive for his 8:15 AM appointment this morning. I left a message to inform him his next appointment is scheduled for Monday 08/10/18 at 8:15 AM.   [name], PT, DPT Physical Therapist with [facility]  08/06/2018 8:35 AM

## 2018-08-10 ENCOUNTER — Ambulatory Visit (HOSPITAL_COMMUNITY): Payer: Medicare Other

## 2018-08-10 ENCOUNTER — Telehealth (HOSPITAL_COMMUNITY): Payer: Self-pay

## 2018-08-10 NOTE — Telephone Encounter (Signed)
No Show #2: I called Joseph [name] at his home number on file to check in with him as he did not arrive for his 8:15 AM appointment today. He answered and stated that he thought all of his appointments would be at 5:30. He stated he has been getting the phone tree reminder calls but has not been listening to the messages. I reminded him his next appointment is this Wednesday 08/12/18 at 8:15 AM and he stated he will be at that appointment.  [name], PT, DPT Physical Therapist with [facility]  08/10/2018 8:57 AM

## 2018-08-12 ENCOUNTER — Ambulatory Visit (HOSPITAL_COMMUNITY): Payer: Medicare Other

## 2018-08-12 ENCOUNTER — Encounter (HOSPITAL_COMMUNITY): Payer: Self-pay

## 2018-08-12 DIAGNOSIS — M25662 Stiffness of left knee, not elsewhere classified: Secondary | ICD-10-CM | POA: Diagnosis not present

## 2018-08-12 DIAGNOSIS — M6281 Muscle weakness (generalized): Secondary | ICD-10-CM | POA: Diagnosis not present

## 2018-08-12 DIAGNOSIS — R600 Localized edema: Secondary | ICD-10-CM | POA: Diagnosis not present

## 2018-08-12 DIAGNOSIS — M25562 Pain in left knee: Secondary | ICD-10-CM | POA: Diagnosis not present

## 2018-08-12 DIAGNOSIS — R262 Difficulty in walking, not elsewhere classified: Secondary | ICD-10-CM | POA: Diagnosis not present

## 2018-08-12 NOTE — Therapy (Signed)
[place], [place], [address] Phone: [phone]   Fax:  [phone]   Progress Note Reporting Period 07/23/18 to 08/12/18  See note below for Objective Data and Assessment of Progress/Goals.   Physical Therapy Treatment  Patient Details  Name: Joseph [name] MRN: [id number] Date of Birth: [birth year] Referring Provider: Edmonia Lynch, MD   Encounter Date: 08/12/2018  PT End of Session - 08/12/18 0812    Visit Number  5    Number of Visits  19    Date for PT Re-Evaluation  09/03/18   minireassess completed 08/12/18   Authorization Type  Medicare    Authorization Time Period  07/23/18 to 09/03/18    Authorization - Visit Number  0    Authorization - Number of Visits  10    PT Start Time  0815    PT Stop Time  3846    PT Time Calculation (min)  40 min    Activity Tolerance  Patient tolerated treatment well;Patient limited by pain    Behavior During Therapy  [facility] for tasks assessed/performed       Past Medical History:  Diagnosis Date  . Anxiety   . Arthritis    oa left knee and lower back  . Asthma   . Cataract    hx of bilateral  . Chronic back pain   . Chronic pain of left knee   . COPD (chronic obstructive pulmonary disease) ([place])   . Diabetes mellitus    type 2  . GERD (gastroesophageal reflux disease)   . INGUINAL PAIN, LEFT 10/03/2009   Qualifier: Diagnosis of  By: Oneida Alar MD, Sandi L   . Overdose    03-24-18 accidental  . Shortness of breath    with heavy exertion  . Sleep apnea     Past Surgical History:  Procedure Laterality Date  . CARPAL TUNNEL RELEASE Left   . CATARACT EXTRACTION W/PHACO Right 03/11/2016   Procedure: CATARACT EXTRACTION PHACO AND INTRAOCULAR LENS PLACEMENT (IOC);  Surgeon: Tonny Branch, MD;  Location: [facility];  Service: Ophthalmology;  Laterality: Right;  CDE 4.24  . EYE SURGERY Right 2018   ioc with lens replacement  . HERNIA REPAIR Bilateral 6599   umbilical  . KNEE  ARTHROSCOPY WITH MEDIAL MENISECTOMY Left 04/22/2014   Procedure: LEFT KNEE ARTHROSCOPY WITH MEDIAL MENISECTOMY;  Surgeon: Carole Civil, MD;  Location: [facility];  Service: Orthopedics;  Laterality: Left;  . KNEE ARTHROSCOPY WITH MEDIAL MENISECTOMY Right 12/06/2015   Procedure: RIGHT KNEE ARTHROSCOPY WITH MEDIAL MENISECTOMY;  Surgeon: Carole Civil, MD;  Location: [facility];  Service: Orthopedics;  Laterality: Right;  . KNEE ARTHROSCOPY WITH MEDIAL MENISECTOMY Left 03/20/2017   Procedure: KNEE ARTHROSCOPY WITH MEDIAL MENISECTOMY;  Surgeon: Carole Civil, MD;  Location: [facility];  Service: Orthopedics;  Laterality: Left;  . ORIF WRIST FRACTURE Left 12/19/2016   Procedure: OPEN REDUCTION INTERNAL FIXATION (ORIF) WRIST FRACTURE;  Surgeon: Leanora Cover, MD;  Location: [facility];  Service: Orthopedics;  Laterality: Left;  accumed   . PARTIAL KNEE ARTHROPLASTY Left 07/21/2018   Procedure: LEFT UNICOMPARTMENTAL KNEE;  Surgeon: Renette Butters, MD;  Location: [facility];  Service: Orthopedics;  Laterality: Left;  . SHOULDER ARTHROCENTESIS Right 2008 or 2010  . VASECTOMY  2014    There were no vitals filed for this visit.  Subjective Assessment - 08/12/18 0815    Subjective  Pt states that every evening, he feels a "Charlie  horse" in his tendon behind his knee (medial HS) and it will stop him in his tracks. This has just been going on for the last few days. His pain is about a 5/10 currently. He saw Dr. Percell Miller last week and he was really pleased with his progress thus far.    Patient Stated Goals  get L knee fixed/back to normal    Currently in Pain?  Yes    Pain Score  5     Pain Location  Knee    Pain Orientation  Left    Pain Descriptors / Indicators  Throbbing    Pain Type  Surgical pain    Pain Onset  1 to 4 weeks ago    Pain Frequency  Constant    Aggravating Factors   bending, WB, turning wrong way    Pain Relieving Factors  ice, rest    Effect of Pain on Daily Activities   increases         [facility] PT Assessment - 08/12/18 0001      Assessment   Medical Diagnosis  L partial TKA    Referring Provider  Edmonia Lynch, MD    Onset Date/Surgical Date  07/21/18    Next MD Visit  09/02/18    Prior Therapy  yes for R shoulder (2008), none for present issue      Observation/Other Assessments   Focus on Therapeutic Outcomes (FOTO)   --      Circumferential Edema   Circumferential - Left   40.5cm, joint line   was 42.5cm joint line     AROM   Left Knee Extension  3   was 5   Left Knee Flexion  123   was 124     Strength   Right Hip Extension  4+/5   was 4   Right Hip ABduction  4+/5   was 4+   Left Hip Flexion  5/5   was 2+   Left Hip Extension  4/5   was 4   Left Hip ABduction  4/5   was 4   Left Knee Flexion  4/5   was 2+   Left Knee Extension  4+/5   was 2+   Left Ankle Dorsiflexion  5/5   was 4+     Ambulation/Gait   Ambulation Distance (Feet)  760 Feet   3MWT, was 161f with RW   Assistive device  None    Gait Pattern  Step-through pattern;Antalgic;Trendelenburg      Balance   Balance Assessed  Yes      Static Standing Balance   Static Standing - Balance Support  No upper extremity supported    Static Standing Balance -  Activities   Single Leg Stance - Left Leg    Static Standing - Comment/# of Minutes  L: 12sec      Standardized Balance Assessment   Standardized Balance Assessment  Five Times Sit to Stand    Five times sit to stand comments   11.4sec, no UE support, decreased weight shift on LLE   was 28sec, BUE support, LLE extended          [facility] 0001      Exercises   Exercises  Knee/Hip      Knee/Hip Exercises: Stretches   Gastroc Stretch  Both;2 reps;30 seconds    Gastroc Stretch Limitations  slant board      Knee/Hip Exercises: Standing   Terminal Knee Extension  Left;10 reps  Theraband Level (Terminal Knee Extension)  Level 3 (Green)    Terminal Knee Extension  Limitations  10" holds      Manual Therapy   Manual Therapy  Edema management;Soft tissue mobilization    Manual therapy comments  completed seperately from all other skilled interventions    Edema Management  L knee for edema control with BLE elevated    Joint Mobilization  STM to distal medial HS and distal VLO and VMO for pain control and decreasing restrictions             PT Education - 08/12/18 0813    Education Details  reassessment findings    Person(s) Educated  Patient    Methods  Explanation;Demonstration    Comprehension  Verbalized understanding;Returned demonstration       PT Short Term Goals - 08/12/18 0819      PT SHORT TERM GOAL #1   Title  Pt will have decreased L knee edema by 3cm or > in order to decrease pain and improve ROM.     Baseline  9/11: decreased by 2cm thus far    Status  Partially Met      PT SHORT TERM GOAL #2   Title  Pt will have improved L knee AROM from 10-95deg in order to decrease pain and maximize gait.    Baseline  9/11: 5-118deg    Status  Achieved      PT SHORT TERM GOAL #3   Title  Pt will have 1/2 grade improvement throughout MMT in order to decrease pain and maximize gait.     Baseline  9/11: see MMT    Status  Partially Met      PT SHORT TERM GOAL #4   Title  Pt will be able to perform 5xSTS in 15 sec or < without UE and with proper mechanics to demo improved balance and functional strength.    Baseline  9/11: 11.4sec, no UE, decreased weight shift off of LLE    Status  Partially Met        PT Long Term Goals - 08/12/18 0819      PT LONG TERM GOAL #1   Title  Pt will have improved L knee AROM from 0-115deg in order to further decrease pain and maximize stair ambulation.    Baseline  9/11: 5-118deg    Time  6    Period  Weeks    Status  Partially Met      PT LONG TERM GOAL #2   Title  Pt will have 1 grade improvement throughout MMT in order to further decrease pain and maximize overall functional mobility.     Baseline  9/11: see MMT    Time  6    Period  Weeks    Status  Partially Met      PT LONG TERM GOAL #3   Title  Pt will be able to ambulate 586f or > during 3MWT with LRAD and gait WFL in order to demo improved functional mobility and maximize community access.    Baseline  9/11: 7636f no AD, antalgic gait, 1 L knee buckle, trendelenberg    Time  6    Period  Weeks    Status  Partially Met      PT LONG TERM GOAL #4   Title  Pt will be able to perform L SLS for 15 sec without UE support in order to demo improved balance and maximize his gait on uneven ground and  stair ambulation.    Baseline  9/11: 12sec    Time  6    Period  Weeks    Status  On-going            Plan - 08/12/18 4315    Clinical Impression Statement  PT reassessed pt's goals and outcome measures this date. He has made tremendous improvements towards his goals thus far as illustrated above. His AROM, MMT, functional strength, balance, and gait have all improved. His AROM was 4-122deg (3-123deg by EOS), he improved to 739f during 3MWT without his SPC, and he was able to stand on his LLE for 12sec today. He did have 2 occurrences of L knee buckling throughout session, 1 during 3MWT and 1 when ambulating throughout therex; he independently recovered. PT feels this is due to functional weakness of L quad. He also reports recent onset of L HS cramping during ambulation; PT also feels this is due to functional weakness of L HS. Overall, pt is progressing greatly and PT feels he can reduce his frequency to 2x/week for remainder of POC since his AROM has essentially normalized and his swelling has decreased by 2cm already. Continue POC as planned, progressing pt as able.     Rehab Potential  Good    PT Frequency  3x / week    PT Duration  6 weeks    PT Treatment/Interventions  ADLs/Self Care Home Management;Aquatic Therapy;Cryotherapy;Electrical Stimulation;Moist Heat;Traction;Biofeedback;Ultrasound;DME Instruction;Gait  training;Stair training;Functional mobility training;Therapeutic activities;Therapeutic exercise;Balance training;Neuromuscular re-education;Patient/family education;Scar mobilization;Passive range of motion;Compression bandaging;Dry needling;Energy conservation;Taping    PT Next Visit Plan  update HEP next session; Continue with focus on managing edema and improve remaining lacking ROM; continue standing TKE and begin to progress strenghtenign slowly    PT Home Exercise Plan  eval: quad sets, heel slides    Consulted and Agree with Plan of Care  Patient       Patient will benefit from skilled therapeutic intervention in order to improve the following deficits and impairments:  Abnormal gait, Decreased activity tolerance, Decreased balance, Decreased mobility, Decreased range of motion, Decreased scar mobility, Decreased strength, Difficulty walking, Hypomobility, Increased edema, Increased fascial restricitons, Increased muscle spasms, Impaired flexibility, Improper body mechanics, Pain  Visit Diagnosis: Acute pain of left knee  Stiffness of left knee, not elsewhere classified  Localized edema  Muscle weakness (generalized)  Difficulty in walking, not elsewhere classified     Problem List Patient Active Problem List   Diagnosis Date Noted  . Primary osteoarthritis of knee 07/21/2018  . Status post unicompartmental knee replacement, left 07/21/2018  . Medicare annual wellness visit, initial 05/20/2018  . Effusion of knee joint (Left) 04/21/2018  . [place] 03/24/2018  . Acute encephalopathy 03/24/2018  . Encephalopathy acute 03/24/2018  . Arthropathy of knee (Left) 01/12/2018  . Opioid-induced constipation ([facility]) 01/12/2018  . Tinea corporis 01/07/2018  . Tinea versicolor 01/07/2018  . Pharmacologic therapy 12/08/2017  . Problems influencing health status 12/08/2017  . Long term prescription opiate use 12/08/2017  . Opiate use 12/08/2017  . Insomnia 11/20/2017  .  Disorder of skeletal system 11/05/2017  . Chronic knee pain (Primary Area of Pain) (Bilateral) (L>R) 11/05/2017  . Chronic wrist pain (Secondary Area of Pain) (Left) 11/05/2017  . Chronic constipation 08/18/2017  . Hyperlipidemia 05/12/2017  . COPD [place] exacerbation [place] 05/07/2017  . Male hypogonadism 04/24/2017  . Post-void dribbling 04/01/2017  . Osteoarthritis of the knee (Left)   . Skin lesions 03/12/2017  . GERD (gastroesophageal reflux disease)  03/12/2017  . Bucket handle tear of meniscus of knee (Right)   . Anxiety and depression 10/07/2015  . Morbid obesity ([place]) 10/06/2015  . OSA (obstructive sleep apnea) 08/31/2014  . Testosterone deficiency 07/01/2014  . S/P knee surgery 04/26/2014  . Meniscal tear of knee, sequela (Left) 04/26/2014  . Medial meniscus, posterior horn derangement 04/22/2014  . Intrinsic asthma 12/19/2013  . Chronic pain syndrome 05/13/2013  . Diabetes ([place]) 05/13/2013  . Mediastinal abnormality 04/10/2012  . Cigarette smoker 04/10/2012       Geraldine Solar PT, DPT  [facility] [address] [place], [place], [address] Phone: [phone]   Fax:  [phone]  Name: Joseph [name] MRN: [id number] Date of Birth: 1971/01/11

## 2018-08-13 ENCOUNTER — Ambulatory Visit: Payer: Medicare Other | Attending: Nurse Practitioner | Admitting: Nurse Practitioner

## 2018-08-13 ENCOUNTER — Ambulatory Visit (HOSPITAL_COMMUNITY): Payer: Medicare Other

## 2018-08-13 ENCOUNTER — Other Ambulatory Visit: Payer: Self-pay

## 2018-08-13 ENCOUNTER — Encounter: Payer: Self-pay | Admitting: Nurse Practitioner

## 2018-08-13 VITALS — BP 119/92 | HR 89 | Temp 98.1°F | Ht 70.0 in | Wt 241.0 lb

## 2018-08-13 DIAGNOSIS — M25562 Pain in left knee: Secondary | ICD-10-CM | POA: Diagnosis not present

## 2018-08-13 DIAGNOSIS — M6281 Muscle weakness (generalized): Secondary | ICD-10-CM

## 2018-08-13 DIAGNOSIS — M25662 Stiffness of left knee, not elsewhere classified: Secondary | ICD-10-CM

## 2018-08-13 DIAGNOSIS — G8929 Other chronic pain: Secondary | ICD-10-CM

## 2018-08-13 DIAGNOSIS — E119 Type 2 diabetes mellitus without complications: Secondary | ICD-10-CM | POA: Diagnosis not present

## 2018-08-13 DIAGNOSIS — Z79891 Long term (current) use of opiate analgesic: Secondary | ICD-10-CM | POA: Diagnosis not present

## 2018-08-13 DIAGNOSIS — Z79899 Other long term (current) drug therapy: Secondary | ICD-10-CM | POA: Insufficient documentation

## 2018-08-13 DIAGNOSIS — R600 Localized edema: Secondary | ICD-10-CM | POA: Diagnosis not present

## 2018-08-13 DIAGNOSIS — K219 Gastro-esophageal reflux disease without esophagitis: Secondary | ICD-10-CM | POA: Diagnosis not present

## 2018-08-13 DIAGNOSIS — Z881 Allergy status to other antibiotic agents status: Secondary | ICD-10-CM | POA: Insufficient documentation

## 2018-08-13 DIAGNOSIS — R262 Difficulty in walking, not elsewhere classified: Secondary | ICD-10-CM

## 2018-08-13 DIAGNOSIS — J449 Chronic obstructive pulmonary disease, unspecified: Secondary | ICD-10-CM | POA: Insufficient documentation

## 2018-08-13 DIAGNOSIS — F419 Anxiety disorder, unspecified: Secondary | ICD-10-CM | POA: Insufficient documentation

## 2018-08-13 DIAGNOSIS — Z9889 Other specified postprocedural states: Secondary | ICD-10-CM | POA: Diagnosis not present

## 2018-08-13 DIAGNOSIS — Z7982 Long term (current) use of aspirin: Secondary | ICD-10-CM | POA: Diagnosis not present

## 2018-08-13 DIAGNOSIS — M25532 Pain in left wrist: Secondary | ICD-10-CM

## 2018-08-13 DIAGNOSIS — Z5181 Encounter for therapeutic drug level monitoring: Secondary | ICD-10-CM | POA: Insufficient documentation

## 2018-08-13 DIAGNOSIS — F17210 Nicotine dependence, cigarettes, uncomplicated: Secondary | ICD-10-CM | POA: Insufficient documentation

## 2018-08-13 DIAGNOSIS — M25561 Pain in right knee: Secondary | ICD-10-CM

## 2018-08-13 DIAGNOSIS — G894 Chronic pain syndrome: Secondary | ICD-10-CM | POA: Diagnosis not present

## 2018-08-13 DIAGNOSIS — Z7984 Long term (current) use of oral hypoglycemic drugs: Secondary | ICD-10-CM | POA: Diagnosis not present

## 2018-08-13 MED ORDER — TAPENTADOL HCL 50 MG PO TABS: 50.0000 mg | ORAL_TABLET | Freq: Three times a day (TID) | ORAL | 0 refills | Status: DC | PRN

## 2018-08-13 NOTE — Progress Notes (Signed)
Patient's Name: Joseph [name]  MRN: [id number]  Referring Provider: Pleas Koch, NP  DOB: 1971-02-06  PCP: Pleas Koch, NP  DOS: 08/13/2018  Note by: Vevelyn Francois NP  Service setting: Ambulatory outpatient  Specialty: Interventional Pain Management  Location: [facility] (AMB) Pain Management Facility    Patient type: Established    Primary Reason(s) for Visit: Encounter for prescription drug management. (Level of risk: moderate)  CC: Knee Pain (left due to surgery)  HPI  [name] is a 47 y.o. year old, male patient, who comes today for a medication management evaluation. He has Mediastinal abnormality; Cigarette smoker; Chronic pain syndrome; Diabetes ([place]); Intrinsic asthma; Medial meniscus, posterior horn derangement; S/P knee surgery; Meniscal tear of knee, sequela (Left); Testosterone deficiency; OSA (obstructive sleep apnea); Morbid obesity ([place]); Anxiety and depression; Bucket handle tear of meniscus of knee (Right); Skin lesions; GERD (gastroesophageal reflux disease); Osteoarthritis of the knee (Left); Post-void dribbling; COPD without exacerbation ([place]); Hyperlipidemia; Chronic constipation; Disorder of skeletal system; Chronic knee pain (Primary Area of Pain) (Bilateral) (L>R); Chronic wrist pain (Secondary Area of Pain) (Left); Insomnia; Male hypogonadism; Pharmacologic therapy; Problems influencing health status; Long term prescription opiate use; Opiate use; Tinea corporis; Tinea versicolor; Arthropathy of knee (Left); Opioid-induced constipation (OIC); Opiate overdose ([place]); Acute encephalopathy; Encephalopathy acute; Effusion of knee joint (Left); Medicare annual wellness visit, initial; Primary osteoarthritis of knee; and Status post unicompartmental knee replacement, left on their problem list. His primarily concern today is the Knee Pain (left due to surgery)  Pain Assessment: Location: Left Knee Radiating: Denies Onset: More than a month ago Duration: (surgical  pain) Quality: Throbbing Severity: 3 /10 (subjective, self-reported pain score)  Note: Reported level is compatible with observation.                          Effect on ADL: no prolonged standing or walking Timing: Constant Modifying factors: medications, ice pack BP: (!) 119/92  HR: 89  Mr. Serpa was last scheduled for an appointment on 07/13/2018 for medication management. During today's appointment we reviewed [name] chronic pain status, as well as his outpatient medication regimen.  He is status post left knee surgery.  He did have additional medications provided oxycodone.  He admits that he continues to have increased pain.  He continues with physical therapy.  The patient  reports that he does not use drugs. His body mass index is 34.58 kg/m.  Further details on both, my assessment(s), as well as the proposed treatment plan, please see below.  Controlled Substance Pharmacotherapy Assessment REMS (Risk Evaluation and Mitigation Strategy)  Analgesic:Nucynta 50 mg 3 times daily(150 mg/dayof Nucynta) MME/day:42m/day BChauncey Fischer RN  08/13/2018 12:53 PM  Sign at close encounter Nursing Pain Medication Assessment:  Safety precautions to be maintained throughout the outpatient stay will include: orient to surroundings, keep bed in low position, maintain call bell within reach at all times, provide assistance with transfer out of bed and ambulation.  Medication Inspection Compliance: Pill count conducted under aseptic conditions, in front of the patient. Neither the pills nor the bottle was removed from the patient's sight at any time. Once count was completed pills were immediately returned to the patient in their original bottle.  Medication: Tapentadol (Nucynta) Pill/Patch Count: 11 of 90 pills remain Pill/Patch Appearance: Markings consistent with prescribed medication Bottle Appearance: Standard pharmacy container. Clearly labeled. Filled Date: 8 / 17 / 2019 Last  Medication intake:  Today  Pharmacokinetics: Liberation and absorption (onset of action): WNL Distribution (time to peak effect): WNL Metabolism and excretion (duration of action): WNL         Pharmacodynamics: Desired effects: Analgesia: [name] reports >71% benefit. Functional ability: Patient reports that medication allows him to accomplish basic ADLs Clinically meaningful improvement in function (CMIF): Sustained CMIF goals met Perceived effectiveness: Described as relatively effective, allowing for increase in activities of daily living (ADL) Undesirable effects: Side-effects or Adverse reactions: None reported Monitoring: [place] PMP: Online review of the past 84-monthperiod conducted. Compliant with practice rules and regulations Last UDS on record: Summary  Date Value Ref Range Status  05/06/2018 FINAL  Final    Comment:    ==================================================================== TOXASSURE SELECT 13 (MW) ==================================================================== Test                             Result       Flag       Units Drug Present and Declared for Prescription Verification   Tapentadol                     >9259        EXPECTED   ng/mg creat    Source of tapentadol is a scheduled prescription medication. Drug Present not Declared for Prescription Verification   Alcohol, Ethyl                 >0.400       UNEXPECTED g/dL    Sources of ethyl alcohol include alcoholic beverages or as a    fermentation product of glucose; glucose is present in this    specimen.  The high concentration of ethyl alcohol and the    presence of glucose supports fermentation as the source of ethyl    alcohol in this specimen. ==================================================================== Test                      Result    Flag   Units      Ref Range   Creatinine              108              mg/dL       >=20 ==================================================================== Declared Medications:  The flagging and interpretation on this report are based on the  following declared medications.  Unexpected results may arise from  inaccuracies in the declared medications.  **Note: The testing scope of this panel includes these medications:  Tapentadol  **Note: The testing scope of this panel does not include following  reported medications:  Albuterol  Albuterol (Ipratropium-Albuterol)  Atorvastatin  Betamethasone  Bupropion  Celecoxib  Ciclopirox  Cinnamon Bark  Citalopram  Diphenhydramine  Empagliflozin  Fluticasone (Breo)  Glipizide  Ipratropium  Ipratropium (Ipratropium-Albuterol)  Lansoprazole  Metformin  Multivitamin (MVI)  Naloxegol  Supplement (Saw Palmetto)  Tamsulosin  Testosterone  Trazodone  Vilanterol (Breo) ==================================================================== For clinical consultation, please call [phone] ====================================================================    UDS interpretation: Compliant          Medication Assessment Form: Reviewed. Patient indicates being compliant with therapy Treatment compliance: Compliant Risk Assessment Profile: Aberrant behavior: See prior evaluations. None observed or detected today Comorbid factors increasing risk of overdose: See prior notes. No additional risks detected today Opioid risk tool (ORT) (Total Score):   Personal History of Substance Abuse (SUD-Substance use disorder):  Alcohol:    Illegal Drugs:  Rx Drugs:    ORT Risk Level calculation:   Risk of substance use disorder (SUD): Low  ORT Scoring interpretation table:  Score <3 = Low Risk for SUD  Score between 4-7 = Moderate Risk for SUD  Score >8 = High Risk for Opioid Abuse   Risk Mitigation Strategies:  Patient Counseling: Covered Patient-Prescriber Agreement (PPA): Present and active  Notification to other  healthcare providers: Done  Pharmacologic Plan: No change in therapy, at this time.             Laboratory Chemistry  Inflammation Markers (CRP: Acute Phase) (ESR: Chronic Phase) Lab Results  Component Value Date   CRP 0.7 11/05/2017   ESRSEDRATE 11 11/05/2017   LATICACIDVEN 2.1 01/24/2013                         Rheumatology Markers No results found for: RF, ANA, LABURIC, URICUR, LYMEIGGIGMAB, LYMEABIGMQN, HLAB27                      Renal Function Markers Lab Results  Component Value Date   BUN 8 07/13/2018   CREATININE 0.74 07/13/2018   BCR 10 11/05/2017   GFRAA >60 07/13/2018   GFRNONAA >60 07/13/2018                             Hepatic Function Markers Lab Results  Component Value Date   AST 11 (L) 03/25/2018   ALT 13 (L) 03/25/2018   ALBUMIN 3.6 03/25/2018   ALKPHOS 67 03/25/2018   LIPASE 38 01/24/2013                        Electrolytes Lab Results  Component Value Date   NA 140 07/13/2018   K 3.8 07/13/2018   CL 103 07/13/2018   CALCIUM 8.9 07/13/2018   MG 2.1 11/05/2017                        Neuropathy Markers Lab Results  Component Value Date   VITAMINB12 341 11/05/2017   HGBA1C 7.1 (H) 07/13/2018   HIV Non Reactive 03/24/2018                        CNS Tests No results found for: SDES, GRAMSTAIN, CULT, COLORCSF, APPEARCSF, RBCCOUNTCSF, WBCCSF, POLYSCSF, LYMPHSCSF, EOSCSF, PROTEINCSF, GLUCCSF, JCVIRUS, CSFOLI, IGGCSF, IGGALB, IGGIND                      Bone Pathology Markers Lab Results  Component Value Date   25OHVITD1 42 11/05/2017   25OHVITD2 <1.0 11/05/2017   25OHVITD3 42 11/05/2017   TESTOSTERONE 208.98 (L) 04/17/2017                         Coagulation Parameters Lab Results  Component Value Date   PLT 202 07/13/2018   DDIMER 0.46 03/07/2012                        Cardiovascular Markers Lab Results  Component Value Date   TROPONINI <0.30 12/09/2013   HGB 15.0 07/13/2018   HCT 43.8 07/13/2018                         CA  Markers No results found  for: CEA, CA125, LABCA2                      Note: Lab results reviewed.  Recent Diagnostic Imaging Results  DG Knee Left Port CLINICAL DATA:  Status post left partial knee replacement.  EXAM: PORTABLE LEFT KNEE - 1-2 VIEW  COMPARISON:  Preoperative MRI of the left knee.  FINDINGS: The patient has undergone replacement of the components of the medial compartment of the left knee. Radiographic positioning of the prosthetic components is good. The interface with the native bone appears normal. There is a moderate amount of fluid and air in the anterior aspect of the joint space.  IMPRESSION: No immediate complication following partial left knee joint replacement.  Electronically Signed   By: David  Martinique M.D.   On: 07/21/2018 10:00  Complexity Note: Imaging results reviewed. Results shared with Mr. Goeden, using Layman's terms.                         Meds   Current Outpatient Medications:  .  albuterol (PROVENTIL) (2.5 MG/3ML) 0.083% nebulizer solution, USE 1 VIAL VIA NEBULIZER FOUR TIMES DAILY AS NEEDED FOR SHORTNESS OF BREATH, Disp: 360 mL, Rfl: 0 .  ANDROGEL PUMP 20.25 MG/ACT (1.62%) GEL, Apply 2 Pump topically daily. , Disp: , Rfl:  .  aspirin EC 81 MG tablet, Take 1 tablet (81 mg total) by mouth 2 (two) times daily. For DVT prophylaxis, Disp: 60 tablet, Rfl: 0 .  atorvastatin (LIPITOR) 20 MG tablet, TAKE ONE (1) TABLET EACH DAY EVERY EVENING, Disp: 90 tablet, Rfl: 1 .  betamethasone dipropionate (DIPROLENE) 0.05 % cream, Apply topically 2 (two) times daily as needed. , Disp: , Rfl:  .  BREO ELLIPTA 200-25 MCG/INH AEPB, INHALE ONE PUFF INTO THE LUNGS DAILY, Disp: 60 each, Rfl: 0 .  buPROPion (WELLBUTRIN SR) 150 MG 12 hr tablet, TAKE ONE TABLET BY MOUTH TWICE A DAY, Disp: 180 tablet, Rfl: 1 .  citalopram (CELEXA) 40 MG tablet, TAKE ONE TABLET BY MOUTH ONCE DAILY, Disp: 30 tablet, Rfl: 11 .  diphenhydrAMINE (BENADRYL) 25 MG tablet, Take 25 mg by  mouth 2 (two) times daily., Disp: , Rfl:  .  docusate sodium (COLACE) 100 MG capsule, Take 1 capsule (100 mg total) by mouth 2 (two) times daily. To prevent constipation while taking pain medication., Disp: 60 capsule, Rfl: 0 .  empagliflozin (JARDIANCE) 10 MG TABS tablet, Take 10 mg by mouth every morning., Disp: 90 tablet, Rfl: 2 .  metFORMIN (GLUCOPHAGE-XR) 500 MG 24 hr tablet, Take 2 tablets (1,000 mg total) by mouth daily with breakfast., Disp: , Rfl:  .  methocarbamol (ROBAXIN) 500 MG tablet, Take 1 tablet (500 mg total) by mouth every 8 (eight) hours as needed for muscle spasms., Disp: 40 tablet, Rfl: 0 .  Multiple Vitamin (MULTIVITAMIN WITH MINERALS) TABS, Take 1 tablet by mouth every morning. Men's once daily multivitamin packet (vitamin e, calcium, ginseng), Disp: , Rfl:  .  ondansetron (ZOFRAN) 4 MG tablet, Take 1 tablet (4 mg total) by mouth every 8 (eight) hours as needed for nausea or vomiting., Disp: 20 tablet, Rfl: 0 .  oxyCODONE (ROXICODONE) 5 MG immediate release tablet, Take 1 tablet (5 mg total) by mouth every 4 (four) hours as needed for breakthrough pain., Disp: 40 tablet, Rfl: 0 .  Saw Palmetto, Serenoa repens, (SAW PALMETTO PO), Take 1 capsule by mouth daily., Disp: , Rfl:  .  tamsulosin (FLOMAX)  0.4 MG CAPS capsule, TAKE ONE CAPSULE BY MOUTH DAILY, Disp: 90 capsule, Rfl: 1 .  [START ON 08/17/2018] tapentadol (NUCYNTA) 50 MG tablet, Take 1 tablet (50 mg total) by mouth every 8 (eight) hours as needed for severe pain., Disp: 90 tablet, Rfl: 0 .  traZODone (DESYREL) 150 MG tablet, Take 1 tablet (150 mg total) by mouth at bedtime as needed for sleep., Disp: 90 tablet, Rfl: 0 .  Turmeric 500 MG CAPS, Take 1 capsule by mouth daily., Disp: , Rfl:  .  gabapentin (NEURONTIN) 300 MG capsule, Take 1 capsule (300 mg total) by mouth 3 (three) times daily for 14 days. For 2 weeks post op for pain., Disp: 42 capsule, Rfl: 0 .  glipiZIDE (GLUCOTROL) 5 MG tablet, TAKE TWO (2) TABLETS BY MOUTH 2  TIMES DAILY, Disp: 360 tablet, Rfl: 1 .  ipratropium (ATROVENT) 0.02 % nebulizer solution, USE 1 VIAL VIA NEBULIZER FOUR TIMES DAILY AS NEEDED, Disp: 300 mL, Rfl: 0 .  Ipratropium-Albuterol (COMBIVENT) 20-100 MCG/ACT AERS respimat, Inhale 1 puff into the lungs every 6 (six) hours as needed for wheezing., Disp: 1 Inhaler, Rfl: 2 .  lansoprazole (PREVACID) 30 MG capsule, TAKE ONE CAPSULE BY MOUTH TWICE A DAY BEFORE A MEAL, Disp: 180 capsule, Rfl: 1  ROS  Constitutional: Denies any fever or chills Gastrointestinal: No reported hemesis, hematochezia, vomiting, or acute GI distress Musculoskeletal: Denies any acute onset joint swelling, redness, loss of ROM, or weakness Neurological: No reported episodes of acute onset apraxia, aphasia, dysarthria, agnosia, amnesia, paralysis, loss of coordination, or loss of consciousness  Allergies  Mr. Malter is allergic to azithromycin; clarithromycin; erythromycin; keflex [cephalexin]; metformin; and symbicort [budesonide-formoterol fumarate].  [place]  Drug: Mr. Scholer  reports that he does not use drugs. Alcohol:  reports that he does not drink alcohol. Tobacco:  reports that he has been smoking cigarettes. He started smoking about 24 years ago. He has a 26.00 pack-year smoking history. He has never used smokeless tobacco. Medical:  has a past medical history of Anxiety, Arthritis, Asthma, Cataract, Chronic back pain, Chronic pain of left knee, COPD (chronic obstructive pulmonary disease) ([place]), Diabetes mellitus, GERD (gastroesophageal reflux disease), INGUINAL PAIN, LEFT (10/03/2009), Overdose, Shortness of breath, and Sleep apnea. Surgical: Mr. Mcquillen  has a past surgical history that includes Carpal tunnel release (Left); Shoulder Arthrocentesis (Right, 2008 or 2010); Vasectomy (2014); Knee arthroscopy with medial menisectomy (Left, 04/22/2014); Knee arthroscopy with medial menisectomy (Right, 12/06/2015); Cataract extraction w/PHACO (Right, 03/11/2016); ORIF wrist  fracture (Left, 12/19/2016); Knee arthroscopy with medial menisectomy (Left, 03/20/2017); Eye surgery (Right, 2018); Hernia repair (Bilateral, 2010); and Partial knee arthroplasty (Left, 07/21/2018). Family: family history includes Clotting disorder in his maternal grandmother; Emphysema in his maternal grandfather and maternal grandmother.  Constitutional Exam  General appearance: Well nourished, well developed, and well hydrated. In no apparent acute distress Vitals:   08/13/18 1253  BP: (!) 119/92  Pulse: 89  Temp: 98.1 F (36.7 C)  SpO2: 96%  Weight: 241 lb (109.3 kg)  Height: '5\' 10"'  (1.778 m)  Psych/Mental status: Alert, oriented x 3 (person, place, & time)       Eyes: PERLA Respiratory: No evidence of acute respiratory distress  Gait & Posture Assessment  Ambulation: Unassisted Gait: Relatively normal for age and body habitus Posture: WNL   Lower Extremity Exam    Side: Right lower extremity  Side: Left lower extremity  Stability: No instability observed          Stability: No instability observed  Skin & Extremity Inspection: Skin color, temperature, and hair growth are WNL. No peripheral edema or cyanosis. No masses, redness, swelling, asymmetry, or associated skin lesions. No contractures.  Skin & Extremity Inspection: Evidence of prior arthroplastic surgery  Functional ROM: Unrestricted ROM                  Functional ROM: Unrestricted ROM                  Muscle Tone/Strength: Functionally intact. No obvious neuro-muscular anomalies detected.  Muscle Tone/Strength: Functionally intact. No obvious neuro-muscular anomalies detected.  Sensory (Neurological): Unimpaired  Sensory (Neurological): Unimpaired  Palpation: No palpable anomalies  Palpation: No palpable anomalies   Assessment  Primary Diagnosis & Pertinent Problem List: The primary encounter diagnosis was Chronic knee pain (Primary Area of Pain) (Bilateral) (L>R). Diagnoses of Chronic wrist pain (Secondary  Area of Pain) (Left), Cigarette smoker, Chronic pain syndrome, and Long term prescription opiate use were also pertinent to this visit.  Status Diagnosis  Persistent Controlled Controlled 1. Chronic knee pain (Primary Area of Pain) (Bilateral) (L>R)   2. Chronic wrist pain (Secondary Area of Pain) (Left)   3. Cigarette smoker   4. Chronic pain syndrome   5. Long term prescription opiate use     Problems updated and reviewed during this visit: No problems updated. Plan of Care  Pharmacotherapy (Medications Ordered): Meds ordered this encounter  Medications  . tapentadol (NUCYNTA) 50 MG tablet    Sig: Take 1 tablet (50 mg total) by mouth every 8 (eight) hours as needed for severe pain.    Dispense:  90 tablet    Refill:  0    Do not place this medication on "Automatic Refill". Patient may have prescription filled one day early if pharmacy is closed on scheduled refill date.    Order Specific Question:   Supervising Provider    Answer:   Milinda Pointer [[id number]]   New Prescriptions   No medications on file   Medications administered today: Arn Medal "[place]" had no medications administered during this visit. Lab-work, procedure(s), and/or referral(s): Orders Placed This Encounter  Procedures  . ToxASSURE Select 13 (MW), Urine   Imaging and/or referral(s): None  Interventional therapies: Planned, scheduled, and/or pending: Not at this time   Considering: Diagnostic Left intra-articular knee injection Diagnostic Left genicular nerve block Possible Left genicular RFA Diagnostic Left Hyalgan series   Palliative PRN treatment(s): Not at this time.     Provider-requested follow-up: Return in about 4 weeks (around 09/10/2018).  Future Appointments  Date Time Provider [place]  08/17/2018  8:15 AM Roseanne Reno B, PTA [facility] None  08/19/2018  5:30 PM Susy Frizzle, PTA [facility] None  08/21/2018  5:30 PM Susy Frizzle, PTA [facility] None   08/24/2018  8:15 AM Roseanne Reno B, PTA [facility] None  08/26/2018  5:30 PM Susy Frizzle, PTA [facility] None  08/28/2018  5:30 PM Susy Frizzle, PTA [facility] None  08/31/2018  8:15 AM Roseanne Reno B, PTA [facility] None  09/02/2018  8:15 AM Geralyn Corwin, PT [facility] None  09/14/2018 11:15 AM Vevelyn Francois, NP [facility] None  05/21/2019  9:00 AM [facility]  05/28/2019 11:00 AM Eustace Pen, LPN [facility]  9/41/7408 11:20 AM Pleas Koch, NP [facility]   Primary Care Physician: Pleas Koch, NP Location: [facility] Outpatient Pain Management Facility Note by: Vevelyn Francois NP Date: 08/13/2018; Time: 4:05 PM  Pain Score  Disclaimer: We use the NRS-11 scale. This is a self-reported, subjective measurement of pain severity with only modest accuracy. It is used primarily to identify changes within a particular patient. It must be understood that outpatient pain scales are significantly less accurate that those used for research, where they can be applied under ideal controlled circumstances with minimal exposure to variables. In reality, the score is likely to be a combination of pain intensity and pain affect, where pain affect describes the degree of emotional arousal or changes in action readiness caused by the sensory experience of pain. Factors such as social and work situation, setting, emotional state, anxiety levels, expectation, and prior pain experience may influence pain perception and show large inter-individual differences that may also be affected by time variables.  Patient instructions provided during this appointment: Patient Instructions   ____________________________________________________________________________________________  Medication Rules  Applies to: All patients receiving prescriptions (written or electronic).  Pharmacy of record: Pharmacy where electronic prescriptions will be sent. If written prescriptions are taken to a  different pharmacy, please inform the nursing staff. The pharmacy listed in the electronic medical record should be the one where you would like electronic prescriptions to be sent.  Prescription refills: Only during scheduled appointments. Applies to both, written and electronic prescriptions.  NOTE: The following applies primarily to controlled substances (Opioid* Pain Medications).   Patient's responsibilities: 1. Pain Pills: Bring all pain pills to every appointment (except for procedure appointments). 2. Pill Bottles: Bring pills in original pharmacy bottle. Always bring newest bottle. Bring bottle, even if empty. 3. Medication refills: You are responsible for knowing and keeping track of what medications you need refilled. The day before your appointment, write a list of all prescriptions that need to be refilled. Bring that list to your appointment and give it to the admitting nurse. Prescriptions will be written only during appointments. If you forget a medication, it will not be "Called in", "Faxed", or "electronically sent". You will need to get another appointment to get these prescribed. 4. Prescription Accuracy: You are responsible for carefully inspecting your prescriptions before leaving our office. Have the discharge nurse carefully go over each prescription with you, before taking them home. Make sure that your name is accurately spelled, that your address is correct. Check the name and dose of your medication to make sure it is accurate. Check the number of pills, and the written instructions to make sure they are clear and accurate. Make sure that you are given enough medication to last until your next medication refill appointment. 5. Taking Medication: Take medication as prescribed. Never take more pills than instructed. Never take medication more frequently than prescribed. Taking less pills or less frequently is permitted and encouraged, when it comes to controlled substances  (written prescriptions).  6. Inform other Doctors: Always inform, all of your healthcare providers, of all the medications you take. 7. Pain Medication from other Providers: You are not allowed to accept any additional pain medication from any other Doctor or Healthcare provider. There are two exceptions to this rule. (see below) In the event that you require additional pain medication, you are responsible for notifying us, as stated below. 8. Medication Agreement: You are responsible for carefully reading and following our Medication Agreement. This must be signed before receiving any prescriptions from our practice. Safely store a copy of your signed Agreement. Violations to the Agreement will result in no further prescriptions. (Additional copies of our Medication Agreement are available upon request.) 9. Laws, Rules, & Regulations:  All patients are expected to follow all Federal and [employer], [employer], Rules, [employer]. Ignorance of the Laws does not constitute a valid excuse. The use of any illegal substances is prohibited. 10. Adopted CDC guidelines & recommendations: Target dosing levels will be at or below 60 MME/day. Use of benzodiazepines** is not recommended.  Exceptions: There are only two exceptions to the rule of not receiving pain medications from other Healthcare Providers. 1. Exception #1 (Emergencies): In the event of an emergency (i.e.: accident requiring emergency care), you are allowed to receive additional pain medication. However, you are responsible for: As soon as you are able, call our office [phone], at any time of the day or night, and leave a message stating your name, the date and nature of the emergency, and the name and dose of the medication prescribed. In the event that your call is answered by a member of our staff, make sure to document and save the date, time, and the name of the person that took your information.  2. Exception #2 (Planned Surgery): In the  event that you are scheduled by another doctor or dentist to have any type of surgery or procedure, you are allowed (for a period no longer than 30 days), to receive additional pain medication, for the acute post-op pain. However, in this case, you are responsible for picking up a copy of our "Post-op Pain Management for Surgeons" handout, and giving it to your surgeon or dentist. This document is available at our office, and does not require an appointment to obtain it. Simply go to our office during business hours (Monday-Thursday from 8:00 AM to 4:00 PM) (Friday 8:00 AM to 12:00 Noon) or if you have a scheduled appointment with [place], prior to your surgery, and ask for it by name. In addition, you will need to provide [place] with your name, name of your surgeon, type of surgery, and date of procedure or surgery.  *Opioid medications include: morphine, codeine, oxycodone, oxymorphone, hydrocodone, hydromorphone, meperidine, tramadol, tapentadol, buprenorphine, fentanyl, methadone. **Benzodiazepine medications include: diazepam (Valium), alprazolam (Xanax), clonazepam (Klonopine), lorazepam (Ativan), clorazepate (Tranxene), chlordiazepoxide (Librium), estazolam (Prosom), oxazepam (Serax), temazepam (Restoril), triazolam (Halcion) (Last updated: 01/29/2018) ____________________________________________________________________________________________   BMI Assessment: Estimated body mass index is 34.58 kg/m as calculated from the following:   Height as of this encounter: '5\' 10"'  (1.778 m).   Weight as of this encounter: 241 lb (109.3 kg).  BMI interpretation table: BMI level Category Range association with higher incidence of chronic pain  <18 kg/m2 Underweight   18.5-24.9 kg/m2 Ideal body weight   25-29.9 kg/m2 Overweight Increased incidence by 20%  30-34.9 kg/m2 Obese (Class I) Increased incidence by 68%  35-39.9 kg/m2 Severe obesity (Class II) Increased incidence by 136%  >40 kg/m2 Extreme obesity  (Class III) Increased incidence by 254%   Patient's current BMI Ideal Body weight  Body mass index is 34.58 kg/m. Ideal body weight: 73 kg (160 lb 15 oz) Adjusted ideal body weight: 87.5 kg (192 lb 15.4 oz)   BMI Readings from Last 4 Encounters:  08/13/18 34.58 kg/m  07/21/18 34.44 kg/m  07/13/18 33.15 kg/m  07/13/18 33.29 kg/m   Wt Readings from Last 4 Encounters:  08/13/18 241 lb (109.3 kg)  07/21/18 240 lb (108.9 kg)  07/13/18 231 lb (104.8 kg)  07/13/18 232 lb (105.2 kg)

## 2018-08-13 NOTE — Therapy (Signed)
[place], [place], [address] Phone: [phone]   Fax:  [phone]  Physical Therapy Treatment  Patient Details  Name: Joseph [name] MRN: [id number] Date of Birth: 1971-04-11 Referring Provider: Edmonia Lynch, MD   Encounter Date: 08/13/2018  PT End of Session - 08/13/18 0600    Visit Number  5    Number of Visits  19    Date for PT Re-Evaluation  09/03/18   minireassess completed 08/12/18   Authorization Type  Medicare    Authorization Time Period  07/23/18 to 09/03/18    Authorization - Visit Number  1    Authorization - Number of Visits  10    Activity Tolerance  Patient tolerated treatment well;Patient limited by pain    Behavior During Therapy  [place] for tasks assessed/performed       Past Medical History:  Diagnosis Date  . Anxiety   . Arthritis    oa left knee and lower back  . Asthma   . Cataract    hx of bilateral  . Chronic back pain   . Chronic pain of left knee   . COPD (chronic obstructive pulmonary disease) ([place])   . Diabetes mellitus    type 2  . GERD (gastroesophageal reflux disease)   . INGUINAL PAIN, LEFT 10/03/2009   Qualifier: Diagnosis of  By: Oneida Alar MD, Sandi L   . Overdose    03-24-18 accidental  . Shortness of breath    with heavy exertion  . Sleep apnea     Past Surgical History:  Procedure Laterality Date  . CARPAL TUNNEL RELEASE Left   . CATARACT EXTRACTION W/PHACO Right 03/11/2016   Procedure: CATARACT EXTRACTION PHACO AND INTRAOCULAR LENS PLACEMENT (IOC);  Surgeon: Tonny Branch, MD;  Location: [facility];  Service: Ophthalmology;  Laterality: Right;  CDE 4.24  . EYE SURGERY Right 2018   ioc with lens replacement  . HERNIA REPAIR Bilateral 9179   umbilical  . KNEE ARTHROSCOPY WITH MEDIAL MENISECTOMY Left 04/22/2014   Procedure: LEFT KNEE ARTHROSCOPY WITH MEDIAL MENISECTOMY;  Surgeon: Carole Civil, MD;  Location: [facility];  Service: Orthopedics;  Laterality: Left;  . KNEE  ARTHROSCOPY WITH MEDIAL MENISECTOMY Right 12/06/2015   Procedure: RIGHT KNEE ARTHROSCOPY WITH MEDIAL MENISECTOMY;  Surgeon: Carole Civil, MD;  Location: [facility];  Service: Orthopedics;  Laterality: Right;  . KNEE ARTHROSCOPY WITH MEDIAL MENISECTOMY Left 03/20/2017   Procedure: KNEE ARTHROSCOPY WITH MEDIAL MENISECTOMY;  Surgeon: Carole Civil, MD;  Location: [facility];  Service: Orthopedics;  Laterality: Left;  . ORIF WRIST FRACTURE Left 12/19/2016   Procedure: OPEN REDUCTION INTERNAL FIXATION (ORIF) WRIST FRACTURE;  Surgeon: Leanora Cover, MD;  Location: [facility];  Service: Orthopedics;  Laterality: Left;  accumed   . PARTIAL KNEE ARTHROPLASTY Left 07/21/2018   Procedure: LEFT UNICOMPARTMENTAL KNEE;  Surgeon: Renette Butters, MD;  Location: [facility];  Service: Orthopedics;  Laterality: Left;  . SHOULDER ARTHROCENTESIS Right 2008 or 2010  . VASECTOMY  2014    There were no vitals filed for this visit.  Subjective Assessment - 08/13/18 1117    Subjective  Pt states that every evening, he feels a "Charlie horse" in his tendon behind his knee (medial HS) and it will stop him in his tracks. This has just been going on for the last few days. His pain is about a 5/10 currently. He saw Dr. Percell Miller last week and he was really pleased with  his progress thus far.    Patient Stated Goals  get L knee fixed/back to normal    Pain Onset  1 to 4 weeks ago                       [facility] Adult PT Treatment/Exercise - 08/13/18 0001      Knee/Hip Exercises: Stretches   Other Knee/Hip Stretches  slant board 3X30"      Knee/Hip Exercises: Standing   Terminal Knee Extension  Left;10 reps    Theraband Level (Terminal Knee Extension)  Level 3 (Green)    Terminal Knee Extension Limitations  10" holds      Knee/Hip Exercises: Supine   Short Arc Quad Sets  10 reps    Short Arc Quad Sets Limitations  limited by pain; added ball for hip adduction to facilitate VMO    Heel Slides   Left;10 reps    Knee Flexion  Left;AROM    Knee Flexion Limitations  120 was 124      Manual Therapy   Manual Therapy  Edema management;Soft tissue mobilization    Manual therapy comments  completed seperately from all other skilled interventions    Edema Management  L knee for edema control with BLE elevated    Joint Mobilization  STM to distal medial HS and distal VLO and VMO for pain control and decreasing restrictions               PT Short Term Goals - 08/12/18 0819      PT SHORT TERM GOAL #1   Title  Pt will have decreased L knee edema by 3cm or > in order to decrease pain and improve ROM.     Baseline  9/11: decreased by 2cm thus far    Status  Partially Met      PT SHORT TERM GOAL #2   Title  Pt will have improved L knee AROM from 10-95deg in order to decrease pain and maximize gait.    Baseline  9/11: 5-118deg    Status  Achieved      PT SHORT TERM GOAL #3   Title  Pt will have 1/2 grade improvement throughout MMT in order to decrease pain and maximize gait.     Baseline  9/11: see MMT    Status  Partially Met      PT SHORT TERM GOAL #4   Title  Pt will be able to perform 5xSTS in 15 sec or < without UE and with proper mechanics to demo improved balance and functional strength.    Baseline  9/11: 11.4sec, no UE, decreased weight shift off of LLE    Status  Partially Met        PT Long Term Goals - 08/12/18 0819      PT LONG TERM GOAL #1   Title  Pt will have improved L knee AROM from 0-115deg in order to further decrease pain and maximize stair ambulation.    Baseline  9/11: 5-118deg    Time  6    Period  Weeks    Status  Partially Met      PT LONG TERM GOAL #2   Title  Pt will have 1 grade improvement throughout MMT in order to further decrease pain and maximize overall functional mobility.    Baseline  9/11: see MMT    Time  6    Period  Weeks    Status  Partially Met  PT LONG TERM GOAL #3   Title  Pt will be able to ambulate 520f or >  during 3MWT with LRAD and gait WFL in order to demo improved functional mobility and maximize community access.    Baseline  9/11: 7624f no AD, antalgic gait, 1 L knee buckle, trendelenberg    Time  6    Period  Weeks    Status  Partially Met      PT LONG TERM GOAL #4   Title  Pt will be able to perform L SLS for 15 sec without UE support in order to demo improved balance and maximize his gait on uneven ground and stair ambulation.    Baseline  9/11: 12sec    Time  6    Period  Weeks    Status  On-going            Plan - 08/13/18 1117    Clinical Impression Statement  Patient reports he needs to leave appointment by 11:45 to make it to his 12:00 pain management appointment today. Overall feels he is improving well but a few of the exercises still hurt to perform - heel slides and SAQ. Feels he may be doing too much for work which is probably why left knee is swelling some.     Rehab Potential  Good    PT Frequency  3x / week    PT Duration  6 weeks    PT Treatment/Interventions  ADLs/Self Care Home Management;Aquatic Therapy;Cryotherapy;Electrical Stimulation;Moist Heat;Traction;Biofeedback;Ultrasound;DME Instruction;Gait training;Stair training;Functional mobility training;Therapeutic activities;Therapeutic exercise;Balance training;Neuromuscular re-education;Patient/family education;Scar mobilization;Passive range of motion;Compression bandaging;Dry needling;Energy conservation;Taping    PT Next Visit Plan  update HEP next session; Continue with focus on managing edema and improve remaining lacking ROM; continue standing TKE and begin to progress strenghtening slowly    PT Home Exercise Plan  eval: quad sets, heel slides    Consulted and Agree with Plan of Care  Patient       Patient will benefit from skilled therapeutic intervention in order to improve the following deficits and impairments:  Abnormal gait, Decreased activity tolerance, Decreased balance, Decreased mobility,  Decreased range of motion, Decreased scar mobility, Decreased strength, Difficulty walking, Hypomobility, Increased edema, Increased fascial restricitons, Increased muscle spasms, Impaired flexibility, Improper body mechanics, Pain  Visit Diagnosis: Acute pain of left knee  Stiffness of left knee, not elsewhere classified  Localized edema  Muscle weakness (generalized)  Difficulty in walking, not elsewhere classified     Problem List Patient Active Problem List   Diagnosis Date Noted  . Primary osteoarthritis of knee 07/21/2018  . Status post unicompartmental knee replacement, left 07/21/2018  . Medicare annual wellness visit, initial 05/20/2018  . Effusion of knee joint (Left) 04/21/2018  . Opiate overdose ([facility] [place]/23/[place]  . Acute encephalopathy 03/24/2018  . Encephalopathy acute 03/24/2018  . Arthropathy of knee (Left) 01/12/2018  . Opioid-induced constipation (OIC) 01/12/2018  . Tinea corporis 01/07/2018  . Tinea versicolor 01/07/2018  . Pharmacologic therapy 12/08/2017  . Problems influencing health status 12/08/2017  . Long term prescription opiate use 12/08/2017  . Opiate use 12/08/2017  . Insomnia 11/20/2017  . Disorder of skeletal system 11/05/2017  . Chronic knee pain (Primary Area of Pain) (Bilateral) (L>R) 11/05/2017  . Chronic wrist pain (Secondary Area of Pain) (Left) 11/05/2017  . Chronic constipation 08/18/2017  . Hyperlipidemia 05/12/2017  . COPD without exacerbation ([facility]06/05/2017  . Male hypogonadism 04/24/2017  . Post-void dribbling 04/01/2017  . Osteoarthritis of the knee (Left)   .  Skin lesions 03/12/2017  . GERD (gastroesophageal reflux disease) 03/12/2017  . Bucket handle tear of meniscus of knee (Right)   . Anxiety and depression 10/07/2015  . Morbid obesity ([place]) 10/06/2015  . OSA (obstructive sleep apnea) 08/31/2014  . Testosterone deficiency 07/01/2014  . S/P knee surgery 04/26/2014  . Meniscal tear of knee, sequela (Left) 04/26/2014   . Medial meniscus, posterior horn derangement 04/22/2014  . Intrinsic asthma 12/19/2013  . Chronic pain syndrome 05/13/2013  . Diabetes ([place]) 05/13/2013  . Mediastinal abnormality 04/10/2012  . Cigarette smoker 04/10/2012    Floria Raveling. Hartnett-Rands, MS, PT [facility][id number] 08/13/2018, 11:50 AM  [facility] [address] [place], [place], [address] Phone: [phone]   Fax:  [phone]  Name: Joseph [name] MRN: [id number] Date of Birth: 1970-12-11

## 2018-08-13 NOTE — Patient Instructions (Addendum)
____________________________________________________________________________________________  Medication Rules  Applies to: All patients receiving prescriptions (written or electronic).  Pharmacy of record: Pharmacy where electronic prescriptions will be sent. If written prescriptions are taken to a different pharmacy, please inform the nursing staff. The pharmacy listed in the electronic medical record should be the one where you would like electronic prescriptions to be sent.  Prescription refills: Only during scheduled appointments. Applies to both, written and electronic prescriptions.  NOTE: The following applies primarily to controlled substances (Opioid* Pain Medications).   Patient's responsibilities: 1. Pain Pills: Bring all pain pills to every appointment (except for procedure appointments). 2. Pill Bottles: Bring pills in original pharmacy bottle. Always bring newest bottle. Bring bottle, even if empty. 3. Medication refills: You are responsible for knowing and keeping track of what medications you need refilled. The day before your appointment, write a list of all prescriptions that need to be refilled. Bring that list to your appointment and give it to the admitting nurse. Prescriptions will be written only during appointments. If you forget a medication, it will not be "Called in", "Faxed", or "electronically sent". You will need to get another appointment to get these prescribed. 4. Prescription Accuracy: You are responsible for carefully inspecting your prescriptions before leaving our office. Have the discharge nurse carefully go over each prescription with you, before taking them home. Make sure that your name is accurately spelled, that your address is correct. Check the name and dose of your medication to make sure it is accurate. Check the number of pills, and the written instructions to make sure they are clear and accurate. Make sure that you are given enough medication to last  until your next medication refill appointment. 5. Taking Medication: Take medication as prescribed. Never take more pills than instructed. Never take medication more frequently than prescribed. Taking less pills or less frequently is permitted and encouraged, when it comes to controlled substances (written prescriptions).  6. Inform other Doctors: Always inform, all of your healthcare providers, of all the medications you take. 7. Pain Medication from other Providers: You are not allowed to accept any additional pain medication from any other Doctor or Healthcare provider. There are two exceptions to this rule. (see below) In the event that you require additional pain medication, you are responsible for notifying us, as stated below. 8. Medication Agreement: You are responsible for carefully reading and following our Medication Agreement. This must be signed before receiving any prescriptions from our practice. Safely store a copy of your signed Agreement. Violations to the Agreement will result in no further prescriptions. (Additional copies of our Medication Agreement are available upon request.) 9. Laws, Rules, & Regulations: All patients are expected to follow all Federal and State Laws, Statutes, Rules, & Regulations. Ignorance of the Laws does not constitute a valid excuse. The use of any illegal substances is prohibited. 10. Adopted CDC guidelines & recommendations: Target dosing levels will be at or below 60 MME/day. Use of benzodiazepines** is not recommended.  Exceptions: There are only two exceptions to the rule of not receiving pain medications from other Healthcare Providers. 1. Exception #1 (Emergencies): In the event of an emergency (i.e.: accident requiring emergency care), you are allowed to receive additional pain medication. However, you are responsible for: As soon as you are able, call our office [phone], at any time of the day or night, and leave a message stating your name, the  date and nature of the emergency, and the name and dose of the medication   prescribed. In the event that your call is answered by a member of our staff, make sure to document and save the date, time, and the name of the person that took your information.  2. Exception #2 (Planned Surgery): In the event that you are scheduled by another doctor or dentist to have any type of surgery or procedure, you are allowed (for a period no longer than 30 days), to receive additional pain medication, for the acute post-op pain. However, in this case, you are responsible for picking up a copy of our "Post-op Pain Management for Surgeons" handout, and giving it to your surgeon or dentist. This document is available at our office, and does not require an appointment to obtain it. Simply go to our office during business hours (Monday-Thursday from 8:00 AM to 4:00 PM) (Friday 8:00 AM to 12:00 Noon) or if you have a scheduled appointment with us, prior to your surgery, and ask for it by name. In addition, you will need to provide us with your name, name of your surgeon, type of surgery, and date of procedure or surgery.  *Opioid medications include: morphine, codeine, oxycodone, oxymorphone, hydrocodone, hydromorphone, meperidine, tramadol, tapentadol, buprenorphine, fentanyl, methadone. **Benzodiazepine medications include: diazepam (Valium), alprazolam (Xanax), clonazepam (Klonopine), lorazepam (Ativan), clorazepate (Tranxene), chlordiazepoxide (Librium), estazolam (Prosom), oxazepam (Serax), temazepam (Restoril), triazolam (Halcion) (Last updated: 01/29/2018) ____________________________________________________________________________________________   BMI Assessment: Estimated body mass index is 34.58 kg/m as calculated from the following:   Height as of this encounter: 5\' 10"  (1.778 m).   Weight as of this encounter: 241 lb (109.3 kg).  BMI interpretation table: BMI level Category Range association with higher  incidence of chronic pain  <18 kg/m2 Underweight   18.5-24.9 kg/m2 Ideal body weight   25-29.9 kg/m2 Overweight Increased incidence by 20%  30-34.9 kg/m2 Obese (Class I) Increased incidence by 68%  35-39.9 kg/m2 Severe obesity (Class II) Increased incidence by 136%  >40 kg/m2 Extreme obesity (Class III) Increased incidence by 254%   Patient's current BMI Ideal Body weight  Body mass index is 34.58 kg/m. Ideal body weight: 73 kg (160 lb 15 oz) Adjusted ideal body weight: 87.5 kg (192 lb 15.4 oz)   BMI Readings from Last 4 Encounters:  08/13/18 34.58 kg/m  07/21/18 34.44 kg/m  07/13/18 33.15 kg/m  07/13/18 33.29 kg/m   Wt Readings from Last 4 Encounters:  08/13/18 241 lb (109.3 kg)  07/21/18 240 lb (108.9 kg)  07/13/18 231 lb (104.8 kg)  07/13/18 232 lb (105.2 kg)

## 2018-08-13 NOTE — Progress Notes (Signed)
Nursing Pain Medication Assessment:  Safety precautions to be maintained throughout the outpatient stay will include: orient to surroundings, keep bed in low position, maintain call bell within reach at all times, provide assistance with transfer out of bed and ambulation.  Medication Inspection Compliance: Pill count conducted under aseptic conditions, in front of the patient. Neither the pills nor the bottle was removed from the patient's sight at any time. Once count was completed pills were immediately returned to the patient in their original bottle.  Medication: Tapentadol (Nucynta) Pill/Patch Count: 11 of 90 pills remain Pill/Patch Appearance: Markings consistent with prescribed medication Bottle Appearance: Standard pharmacy container. Clearly labeled. Filled Date: 8 / 17 / 2019 Last Medication intake:  Today

## 2018-08-14 NOTE — Addendum Note (Signed)
Addended by: Barbette MerinoKING, CRYSTAL M on: 08/14/2018 01:30 PM   Modules accepted: Level of Service

## 2018-08-17 ENCOUNTER — Telehealth (HOSPITAL_COMMUNITY): Payer: Self-pay | Admitting: Physical Therapy

## 2018-08-17 ENCOUNTER — Ambulatory Visit (HOSPITAL_COMMUNITY): Payer: Medicare Other | Admitting: Physical Therapy

## 2018-08-17 ENCOUNTER — Other Ambulatory Visit: Payer: Self-pay | Admitting: Internal Medicine

## 2018-08-17 ENCOUNTER — Other Ambulatory Visit: Payer: Self-pay | Admitting: Primary Care

## 2018-08-17 DIAGNOSIS — K219 Gastro-esophageal reflux disease without esophagitis: Secondary | ICD-10-CM

## 2018-08-17 NOTE — Telephone Encounter (Signed)
Pt did not show for appointment.  Called and left message on VM regarding missed appointment and reminder for next appointment scheduled on Wednesday 9/18.  Lurena NidaAmy B Frazier, PTA/CLT [phone]

## 2018-08-19 ENCOUNTER — Encounter (HOSPITAL_COMMUNITY): Payer: Self-pay

## 2018-08-19 ENCOUNTER — Ambulatory Visit (HOSPITAL_COMMUNITY): Payer: Medicare Other

## 2018-08-19 DIAGNOSIS — M6281 Muscle weakness (generalized): Secondary | ICD-10-CM

## 2018-08-19 DIAGNOSIS — R600 Localized edema: Secondary | ICD-10-CM | POA: Diagnosis not present

## 2018-08-19 DIAGNOSIS — M25562 Pain in left knee: Secondary | ICD-10-CM

## 2018-08-19 DIAGNOSIS — M25662 Stiffness of left knee, not elsewhere classified: Secondary | ICD-10-CM | POA: Diagnosis not present

## 2018-08-19 DIAGNOSIS — R262 Difficulty in walking, not elsewhere classified: Secondary | ICD-10-CM | POA: Diagnosis not present

## 2018-08-19 LAB — TOXASSURE SELECT 13 (MW), URINE

## 2018-08-19 NOTE — Patient Instructions (Signed)
Knee Extension: Quad Set (Eccentric), (Resistance Band)    Stand holding support, tubing behind fully extended knee. Slowly bend knee for 3-5 seconds. Use resistance band. 15 reps per set, 2 sets per day, 4 days per week.  http://ecce.exer.us/132   Copyright  VHI. All rights reserved.   Calf Stretch    Place hands on wall at shoulder height. Keeping back leg straight, bend front leg, feet pointing forward, heels flat on floor. Lean forward slightly until stretch is felt in calf of back leg. Hold stretch 30 seconds, breathing slowly in and out. Repeat stretch with other leg back. Do 3 sessions per day. Variation: Use chair or table for support.  Copyright  VHI. All rights reserved.

## 2018-08-19 NOTE — Therapy (Signed)
[place], [place], [address] Phone: [phone]   Fax:  [phone]  Physical Therapy Treatment  Patient Details  Name: Joseph [name] MRN: [id number] Date of Birth: 1971-09-05 Referring Provider: Edmonia Lynch, MD   Encounter Date: 08/19/2018  PT End of Session - 08/19/18 1749    Visit Number  7    Number of Visits  19    Date for PT Re-Evaluation  09/03/18   minireassess completed 08/12/18   Authorization Type  Medicare    Authorization Time Period  07/23/18 to 09/03/18    Authorization - Visit Number  3    Authorization - Number of Visits  10    PT Start Time  8250    PT Stop Time  5397    PT Time Calculation (min)  42 min    Activity Tolerance  Patient tolerated treatment well;Patient limited by pain;No increased pain    Behavior During Therapy  WFL for tasks assessed/performed       Past Medical History:  Diagnosis Date  . Anxiety   . Arthritis    oa left knee and lower back  . Asthma   . Cataract    hx of bilateral  . Chronic back pain   . Chronic pain of left knee   . COPD (chronic obstructive pulmonary disease) ([place])   . Diabetes mellitus    type 2  . GERD (gastroesophageal reflux disease)   . INGUINAL PAIN, LEFT 10/03/2009   Qualifier: Diagnosis of  By: Oneida Alar MD, Sandi L   . Overdose    03-24-18 accidental  . Shortness of breath    with heavy exertion  . Sleep apnea     Past Surgical History:  Procedure Laterality Date  . CARPAL TUNNEL RELEASE Left   . CATARACT EXTRACTION W/PHACO Right 03/11/2016   Procedure: CATARACT EXTRACTION PHACO AND INTRAOCULAR LENS PLACEMENT (IOC);  Surgeon: Tonny Branch, MD;  Location: [facility];  Service: Ophthalmology;  Laterality: Right;  CDE 4.24  . EYE SURGERY Right 2018   ioc with lens replacement  . HERNIA REPAIR Bilateral 6734   umbilical  . KNEE ARTHROSCOPY WITH MEDIAL MENISECTOMY Left 04/22/2014   Procedure: LEFT KNEE ARTHROSCOPY WITH MEDIAL MENISECTOMY;   Surgeon: Carole Civil, MD;  Location: [facility];  Service: Orthopedics;  Laterality: Left;  . KNEE ARTHROSCOPY WITH MEDIAL MENISECTOMY Right 12/06/2015   Procedure: RIGHT KNEE ARTHROSCOPY WITH MEDIAL MENISECTOMY;  Surgeon: Carole Civil, MD;  Location: [facility];  Service: Orthopedics;  Laterality: Right;  . KNEE ARTHROSCOPY WITH MEDIAL MENISECTOMY Left 03/20/2017   Procedure: KNEE ARTHROSCOPY WITH MEDIAL MENISECTOMY;  Surgeon: Carole Civil, MD;  Location: [facility];  Service: Orthopedics;  Laterality: Left;  . ORIF WRIST FRACTURE Left 12/19/2016   Procedure: OPEN REDUCTION INTERNAL FIXATION (ORIF) WRIST FRACTURE;  Surgeon: Leanora Cover, MD;  Location: [facility];  Service: Orthopedics;  Laterality: Left;  accumed   . PARTIAL KNEE ARTHROPLASTY Left 07/21/2018   Procedure: LEFT UNICOMPARTMENTAL KNEE;  Surgeon: Renette Butters, MD;  Location: [facility];  Service: Orthopedics;  Laterality: Left;  . SHOULDER ARTHROCENTESIS Right 2008 or 2010  . VASECTOMY  2014    There were no vitals filed for this visit.  Subjective Assessment - 08/19/18 1737    Subjective  Pt stated he's had some increased intermittent sharp pain on medial aspect of knee, has taken pain medication prior session.  He feels like a Charlie horse in  his tendon behind knee.      Patient Stated Goals  get L knee fixed/back to normal    Currently in Pain?  Yes    Pain Score  2     Pain Location  Knee    Pain Orientation  Left    Pain Descriptors / Indicators  Sharp    Pain Type  Surgical pain    Pain Onset  More than a month ago    Pain Frequency  Intermittent    Aggravating Factors   bending, WB, turning wrong way    Pain Relieving Factors  medication, ice pack    Effect of Pain on Daily Activities  no prolonged standing or walking                       [facility] Adult PT Treatment/Exercise - 08/19/18 0001      Exercises   Exercises  Knee/Hip      Knee/Hip Exercises: Stretches   Other Knee/Hip  Stretches  slant board 3X30"      Knee/Hip Exercises: Standing   Heel Raises  Both;15 reps    Heel Raises Limitations  on incline    Terminal Knee Extension  Left;15 reps    Theraband Level (Terminal Knee Extension)  Level 3 (Green)    Terminal Knee Extension Limitations  10" holds    Lateral Step Up  Left;10 reps;Hand Hold: 1;Step Height: 4"    Forward Step Up  Left;10 reps;Hand Hold: 1;Step Height: 4"    Rocker Board  2 minutes    Rocker Board Limitations  lateral      Knee/Hip Exercises: Supine   Short Arc [employer]  15 reps    Short Arc Quad Sets Limitations  no pain with exercise; 5" holds      Manual Therapy   Manual Therapy  Joint mobilization;Edema management    Manual therapy comments  completed seperately from all other skilled interventions    Edema Management  L knee for edema control with BLE elevated    Joint Mobilization  patella mobs all directions- pain free                PT Short Term Goals - 08/12/18 0819      PT SHORT TERM GOAL #1   Title  Pt will have decreased L knee edema by 3cm or > in order to decrease pain and improve ROM.     Baseline  9/11: decreased by 2cm thus far    Status  Partially Met      PT SHORT TERM GOAL #2   Title  Pt will have improved L knee AROM from 10-95deg in order to decrease pain and maximize gait.    Baseline  9/11: 5-118deg    Status  Achieved      PT SHORT TERM GOAL #3   Title  Pt will have 1/2 grade improvement throughout MMT in order to decrease pain and maximize gait.     Baseline  9/11: see MMT    Status  Partially Met      PT SHORT TERM GOAL #4   Title  Pt will be able to perform 5xSTS in 15 sec or < without UE and with proper mechanics to demo improved balance and functional strength.    Baseline  9/11: 11.4sec, no UE, decreased weight shift off of LLE    Status  Partially Met        PT Long Term Goals -  08/12/18 0819      PT LONG TERM GOAL #1   Title  Pt will have improved L knee AROM from 0-115deg  in order to further decrease pain and maximize stair ambulation.    Baseline  9/11: 5-118deg    Time  6    Period  Weeks    Status  Partially Met      PT LONG TERM GOAL #2   Title  Pt will have 1 grade improvement throughout MMT in order to further decrease pain and maximize overall functional mobility.    Baseline  9/11: see MMT    Time  6    Period  Weeks    Status  Partially Met      PT LONG TERM GOAL #3   Title  Pt will be able to ambulate 52f or > during 3MWT with LRAD and gait WFL in order to demo improved functional mobility and maximize community access.    Baseline  9/11: 7664f no AD, antalgic gait, 1 L knee buckle, trendelenberg    Time  6    Period  Weeks    Status  Partially Met      PT LONG TERM GOAL #4   Title  Pt will be able to perform L SLS for 15 sec without UE support in order to demo improved balance and maximize his gait on uneven ground and stair ambulation.    Baseline  9/11: 12sec    Time  6    Period  Weeks    Status  On-going            Plan - 08/19/18 1805    Clinical Impression Statement  Session focus with quad strengthening, knee mobility and manual technqiues to assist with pain control.  Pt demonstrated improved tolerance and no reports of pain wiht SAQs this session.  Reviewed HEP wiht additional HEP given for TKE and calf stretches.  Added functional strengthening exercises including lateral and forward step ups with good tolerance.  No reports of increased pain through session.  Pt does continue to have edema present proximal knee, EOS with manual retrograde massage to address swelling and pain control.      Rehab Potential  Good    PT Frequency  3x / week    PT Duration  6 weeks    PT Treatment/Interventions  ADLs/Self Care Home Management;Aquatic Therapy;Cryotherapy;Electrical Stimulation;Moist Heat;Traction;Biofeedback;Ultrasound;DME Instruction;Gait training;Stair training;Functional mobility training;Therapeutic activities;Therapeutic  exercise;Balance training;Neuromuscular re-education;Patient/family education;Scar mobilization;Passive range of motion;Compression bandaging;Dry needling;Energy conservation;Taping    PT Next Visit Plan  F/U with pain following new strengthening exercises.  Progress slowly with additional STS and SLS if pain level low next sessoin.  Continue with focus on managing edema and improve remaining lacking ROM;     PT Home Exercise Plan  eval: quad sets, heel slides; 08/19/18:  TKE and calf stretches       Patient will benefit from skilled therapeutic intervention in order to improve the following deficits and impairments:  Abnormal gait, Decreased activity tolerance, Decreased balance, Decreased mobility, Decreased range of motion, Decreased scar mobility, Decreased strength, Difficulty walking, Hypomobility, Increased edema, Increased fascial restricitons, Increased muscle spasms, Impaired flexibility, Improper body mechanics, Pain  Visit Diagnosis: Acute pain of left knee  Stiffness of left knee, not elsewhere classified  Localized edema  Muscle weakness (generalized)  Difficulty in walking, not elsewhere classified     Problem List Patient Active Problem List   Diagnosis Date Noted  . Primary osteoarthritis of knee  07/21/2018  . Status post unicompartmental knee replacement, left 07/21/2018  . Medicare annual wellness visit, initial 05/20/2018  . Effusion of knee joint (Left) 04/21/2018  . Opiate overdose ([place]) 03/24/2018  . Acute encephalopathy 03/24/2018  . Encephalopathy acute 03/24/2018  . Arthropathy of knee (Left) 01/12/2018  . Opioid-induced constipation (OIC) 01/12/2018  . Tinea corporis 01/07/2018  . Tinea versicolor 01/07/2018  . Pharmacologic therapy 12/08/2017  . Problems influencing health status 12/08/2017  . Long term prescription opiate use 12/08/2017  . Opiate use 12/08/2017  . Insomnia 11/20/2017  . Disorder of skeletal system 11/05/2017  . Chronic knee pain  (Primary Area of Pain) (Bilateral) (L>R) 11/05/2017  . Chronic wrist pain (Secondary Area of Pain) (Left) 11/05/2017  . Chronic constipation 08/18/2017  . Hyperlipidemia 05/12/2017  . COPD without exacerbation ([place]) 05/07/2017  . Male hypogonadism 04/24/2017  . Post-void dribbling 04/01/2017  . Osteoarthritis of the knee (Left)   . Skin lesions 03/12/2017  . GERD (gastroesophageal reflux disease) 03/12/2017  . Bucket handle tear of meniscus of knee (Right)   . Anxiety and depression 10/07/2015  . Morbid obesity ([place]) 10/06/2015  . OSA (obstructive sleep apnea) 08/31/2014  . Testosterone deficiency 07/01/2014  . S/P knee surgery 04/26/2014  . Meniscal tear of knee, sequela (Left) 04/26/2014  . Medial meniscus, posterior horn derangement 04/22/2014  . Intrinsic asthma 12/19/2013  . Chronic pain syndrome 05/13/2013  . Diabetes ([employer]) 05/13/2013  . Mediastinal abnormality 04/10/2012  . Cigarette smoker 04/10/2012   Ihor Austin, [place]; [place]  Aldona Lento 08/19/2018, 6:25 PM  [facility] [address] [place], [place], [address] Phone: [phone]   Fax:  [phone]  Name: BIRAN MAYBERRY MRN: [id number] Date of Birth: 1971-08-16

## 2018-08-20 ENCOUNTER — Telehealth: Payer: Self-pay | Admitting: Internal Medicine

## 2018-08-20 DIAGNOSIS — J449 Chronic obstructive pulmonary disease, unspecified: Secondary | ICD-10-CM

## 2018-08-20 MED ORDER — FLUTICASONE FUROATE-VILANTEROL 200-25 MCG/INH IN AEPB: 1.0000 | INHALATION_SPRAY | Freq: Every day | RESPIRATORY_TRACT | 0 refills | Status: DC

## 2018-08-20 NOTE — Telephone Encounter (Signed)
[facility] with [facility]. Patient has an appointment in December. Nothing further needed at this time.

## 2018-08-21 ENCOUNTER — Encounter (HOSPITAL_COMMUNITY): Payer: Self-pay

## 2018-08-21 ENCOUNTER — Ambulatory Visit (HOSPITAL_COMMUNITY): Payer: Medicare Other

## 2018-08-21 DIAGNOSIS — R262 Difficulty in walking, not elsewhere classified: Secondary | ICD-10-CM | POA: Diagnosis not present

## 2018-08-21 DIAGNOSIS — E291 Testicular hypofunction: Secondary | ICD-10-CM | POA: Diagnosis not present

## 2018-08-21 DIAGNOSIS — M25562 Pain in left knee: Secondary | ICD-10-CM

## 2018-08-21 DIAGNOSIS — M25662 Stiffness of left knee, not elsewhere classified: Secondary | ICD-10-CM | POA: Diagnosis not present

## 2018-08-21 DIAGNOSIS — R600 Localized edema: Secondary | ICD-10-CM | POA: Diagnosis not present

## 2018-08-21 DIAGNOSIS — M6281 Muscle weakness (generalized): Secondary | ICD-10-CM

## 2018-08-21 NOTE — Therapy (Signed)
[facility], [place], [address] Phone: [phone]   Fax:  [phone]  Physical Therapy Treatment  Patient Details  Name: Joseph [name] MRN: [id number] Date of Birth: 07-03-1971 Referring Provider: Edmonia Lynch, MD   Encounter Date: 08/21/2018  PT End of Session - 08/21/18 1731    Visit Number  8    Number of Visits  19    Date for PT Re-Evaluation  09/03/18   Minireassess complete 08/12/18   Authorization Type  Medicare    Authorization Time Period  07/23/18 to 09/03/18    Authorization - Visit Number  4    Authorization - Number of Visits  10    PT Start Time  5329    PT Stop Time  1812    PT Time Calculation (min)  49 min    Activity Tolerance  Patient tolerated treatment well;Patient limited by pain;No increased pain    Behavior During Therapy  WFL for tasks assessed/performed       Past Medical History:  Diagnosis Date  . Anxiety   . Arthritis    oa left knee and lower back  . Asthma   . Cataract    hx of bilateral  . Chronic back pain   . Chronic pain of left knee   . COPD (chronic obstructive pulmonary disease) ([place])   . Diabetes mellitus    type 2  . GERD (gastroesophageal reflux disease)   . INGUINAL PAIN, LEFT 10/03/2009   Qualifier: Diagnosis of  By: Oneida Alar MD, Sandi L   . Overdose    03-24-18 accidental  . Shortness of breath    with heavy exertion  . Sleep apnea     Past Surgical History:  Procedure Laterality Date  . CARPAL TUNNEL RELEASE Left   . CATARACT EXTRACTION W/PHACO Right 03/11/2016   Procedure: CATARACT EXTRACTION PHACO AND INTRAOCULAR LENS PLACEMENT (IOC);  Surgeon: Tonny Branch, MD;  Location: [facility];  Service: Ophthalmology;  Laterality: Right;  CDE 4.24  . EYE SURGERY Right 2018   ioc with lens replacement  . HERNIA REPAIR Bilateral 9242   umbilical  . KNEE ARTHROSCOPY WITH MEDIAL MENISECTOMY Left 04/22/2014   Procedure: LEFT KNEE ARTHROSCOPY WITH MEDIAL MENISECTOMY;   Surgeon: Carole Civil, MD;  Location: [facility];  Service: Orthopedics;  Laterality: Left;  . KNEE ARTHROSCOPY WITH MEDIAL MENISECTOMY Right 12/06/2015   Procedure: RIGHT KNEE ARTHROSCOPY WITH MEDIAL MENISECTOMY;  Surgeon: Carole Civil, MD;  Location: [facility];  Service: Orthopedics;  Laterality: Right;  . KNEE ARTHROSCOPY WITH MEDIAL MENISECTOMY Left 03/20/2017   Procedure: KNEE ARTHROSCOPY WITH MEDIAL MENISECTOMY;  Surgeon: Carole Civil, MD;  Location: [facility];  Service: Orthopedics;  Laterality: Left;  . ORIF WRIST FRACTURE Left 12/19/2016   Procedure: OPEN REDUCTION INTERNAL FIXATION (ORIF) WRIST FRACTURE;  Surgeon: Leanora Cover, MD;  Location: [facility];  Service: Orthopedics;  Laterality: Left;  accumed   . PARTIAL KNEE ARTHROPLASTY Left 07/21/2018   Procedure: LEFT UNICOMPARTMENTAL KNEE;  Surgeon: Renette Butters, MD;  Location[facility];  Service: Orthopedics;  Laterality: Left;  . SHOULDER ARTHROCENTESIS Right 2008 or 2010  . VASECTOMY  2014    There were no vitals filed for this visit.  Subjective Assessment - 08/21/18 1723    Subjective  Pt stated he discussed the sharp pain with PA and moved the apt to this Monday.  Pt reports he continues to have intermittent sharp pain on medial  aspect of knee.      Patient Stated Goals  get L knee fixed/back to normal    Currently in Pain?  Yes    Pain Score  2     Pain Location  Knee    Pain Orientation  Left    Pain Descriptors / Indicators  Sharp    Pain Type  Surgical pain    Pain Onset  More than a month ago    Pain Frequency  Intermittent    Aggravating Factors   bendin. WB, turning wrong way    Pain Relieving Factors  medication, ice pack    Effect of Pain on Daily Activities  no prolonged standing or walking                       [facility] Adult PT Treatment/Exercise - 08/21/18 0001      Exercises   Exercises  Knee/Hip      Knee/Hip Exercises: Stretches   Other Knee/Hip Stretches  slant  board 3X30"      Knee/Hip Exercises: Standing   Heel Raises  Both;15 reps    Heel Raises Limitations  on incline    Lateral Step Up  Left;Hand Hold: 1;Step Height: 4";10 reps    Forward Step Up  Left;15 reps;Hand Hold: 1;Step Height: 4"    Rocker Board  2 minutes    Rocker Board Limitations  lateral    SLS  Lt 34", Rt 25" max of 3      Knee/Hip Exercises: Seated   Sit to Sand  10 reps;without UE support   eccentric control     Knee/Hip Exercises: Supine   Quad Sets  Left;10 reps    Short Arc [employer]  15 reps    Short Arc Quad Sets Limitations  no pain with SAQ    Patellar Mobs  all directions      Manual Therapy   Manual Therapy  Joint mobilization;Edema management    Manual therapy comments  completed seperately from all other skilled interventions    Edema Management  L knee for edema control with BLE elevated    Joint Mobilization  patella mobs all directions- pain free              PT Education - 08/21/18 1820    Education Details  Educated on technqiues to assist with hypersensitivity to incision; educated benefits with compression garments to assist with swelling for pain control, given paperwork to purchase from [employer]    Person(s) Educated  Patient    Methods  Explanation;Demonstration;Verbal cues    Comprehension  Verbalized understanding;Returned demonstration       PT Short Term Goals - 08/12/18 0819      PT SHORT TERM GOAL #1   Title  Pt will have decreased L knee edema by 3cm or > in order to decrease pain and improve ROM.     Baseline  9/11: decreased by 2cm thus far    Status  Partially Met      PT SHORT TERM GOAL #2   Title  Pt will have improved L knee AROM from 10-95deg in order to decrease pain and maximize gait.    Baseline  9/11: 5-118deg    Status  Achieved      PT SHORT TERM GOAL #3   Title  Pt will have 1/2 grade improvement throughout MMT in order to decrease pain and maximize gait.     Baseline  9/11:  see  MMT    Status  Partially Met      PT SHORT TERM GOAL #4   Title  Pt will be able to perform 5xSTS in 15 sec or < without UE and with proper mechanics to demo improved balance and functional strength.    Baseline  9/11: 11.4sec, no UE, decreased weight shift off of LLE    Status  Partially Met        PT Long Term Goals - 08/12/18 0819      PT LONG TERM GOAL #1   Title  Pt will have improved L knee AROM from 0-115deg in order to further decrease pain and maximize stair ambulation.    Baseline  9/11: 5-118deg    Time  6    Period  Weeks    Status  Partially Met      PT LONG TERM GOAL #2   Title  Pt will have 1 grade improvement throughout MMT in order to further decrease pain and maximize overall functional mobility.    Baseline  9/11: see MMT    Time  6    Period  Weeks    Status  Partially Met      PT LONG TERM GOAL #3   Title  Pt will be able to ambulate 573f or > during 3MWT with LRAD and gait WFL in order to demo improved functional mobility and maximize community access.    Baseline  9/11: 7661f no AD, antalgic gait, 1 L knee buckle, trendelenberg    Time  6    Period  Weeks    Status  Partially Met      PT LONG TERM GOAL #4   Title  Pt will be able to perform L SLS for 15 sec without UE support in order to demo improved balance and maximize his gait on uneven ground and stair ambulation.    Baseline  9/11: 12sec    Time  6    Period  Weeks    Status  On-going            Plan - 08/21/18 1822    Clinical Impression Statement  Continued session focus with LE strenghtening and manual technqiues to assist with edema and pain control.  Pt continues to c/o intermittent sharp pain with gait and some standing activities.  Pt stated he has made apt wiht MD on Monday to address any posibilities of infection or rational for sharp pain.  Upon observation of knee there is no redness or heat and minimal edema proximal knee.  Reviewed signs of infection wiht pt wiht verbalize  understanding.  Pt also continues to ambulate wiht SPC on same side, continued to educate pt on rational for opposite arm to reduce pressure for pain control.  Pt verbalizes rational though states "I feel like my knee is going to give on me and use the cane more like a crutch."  Pt educated on technqiues to assist with hypersensitivity around incision as well as benefits of compression garmets to assist with swelling.      Rehab Potential  Good    PT Frequency  3x / week    PT Duration  6 weeks    PT Treatment/Interventions  ADLs/Self Care Home Management;Aquatic Therapy;Cryotherapy;Electrical Stimulation;Moist Heat;Traction;Biofeedback;Ultrasound;DME Instruction;Gait training;Stair training;Functional mobility training;Therapeutic activities;Therapeutic exercise;Balance training;Neuromuscular re-education;Patient/family education;Scar mobilization;Passive range of motion;Compression bandaging;Dry needling;Energy conservation;Taping    PT Next Visit Plan  F/U with MD following apt on 08/24/2018.  Continue to progress strengthening slowly  keeping pain levels low.  COntinue wiht focus on managing edema and improve remaining deficits wiht ROM (extension.)    PT Home Exercise Plan  eval: quad sets, heel slides; 08/19/18:  TKE and calf stretches       Patient will benefit from skilled therapeutic intervention in order to improve the following deficits and impairments:  Abnormal gait, Decreased activity tolerance, Decreased balance, Decreased mobility, Decreased range of motion, Decreased scar mobility, Decreased strength, Difficulty walking, Hypomobility, Increased edema, Increased fascial restricitons, Increased muscle spasms, Impaired flexibility, Improper body mechanics, Pain  Visit Diagnosis: Acute pain of left knee  Stiffness of left knee, not elsewhere classified  Localized edema  Muscle weakness (generalized)  Difficulty in walking, not elsewhere classified     Problem List Patient Active  Problem List   Diagnosis Date Noted  . Primary osteoarthritis of knee 07/21/2018  . Status post unicompartmental knee replacement, left 07/21/2018  . Medicare annual wellness visit, initial 05/20/2018  . Effusion of knee joint (Left) 04/21/2018  . Opiate overdose ([place]) 03/24/2018  . Acute encephalopathy 03/24/2018  . Encephalopathy acute 03/24/2018  . Arthropathy of knee (Left) 01/12/2018  . Opioid-induced constipation (OIC) 01/12/2018  . Tinea corporis 01/07/2018  . Tinea versicolor 01/07/2018  . Pharmacologic therapy 12/08/2017  . Problems influencing health status 12/08/2017  . Long term prescription opiate use 12/08/2017  . Opiate use 12/08/2017  . Insomnia 11/20/2017  . Disorder of skeletal system 11/05/2017  . Chronic knee pain (Primary Area of Pain) (Bilateral) (L>R) 11/05/2017  . Chronic wrist pain (Secondary Area of Pain) (Left) 11/05/2017  . Chronic constipation 08/18/2017  . Hyperlipidemia 05/12/2017  . COPD without exacerbation ([place]) 05/07/2017  . Male hypogonadism 04/24/2017  . Post-void dribbling 04/01/2017  . Osteoarthritis of the knee (Left)   . Skin lesions 03/12/2017  . GERD (gastroesophageal reflux disease) 03/12/2017  . Bucket handle tear of meniscus of knee (Right)   . Anxiety and depression 10/07/2015  . Morbid obesity ([place]) 10/06/2015  . OSA (obstructive sleep apnea) 08/31/2014  . Testosterone deficiency 07/01/2014  . S/P knee surgery 04/26/2014  . Meniscal tear of knee, sequela (Left) 04/26/2014  . Medial meniscus, posterior horn derangement 04/22/2014  . Intrinsic asthma 12/19/2013  . Chronic pain syndrome 05/13/2013  . Diabetes ([place]) 05/13/2013  . Mediastinal abnormality 04/10/2012  . Cigarette smoker 04/10/2012   Ihor Austin, LPTA; [place]  Aldona Lento 08/21/2018, 6:31 PM  [facility] [address] [place], [place], [address] Phone: [phone]   Fax:  [phone]  Name:  ROOSEVELT EIMERS MRN: [id number] Date of Birth: 1970-12-14

## 2018-08-24 ENCOUNTER — Ambulatory Visit (HOSPITAL_COMMUNITY): Payer: Medicare Other | Admitting: Physical Therapy

## 2018-08-24 DIAGNOSIS — M1712 Unilateral primary osteoarthritis, left knee: Secondary | ICD-10-CM | POA: Diagnosis not present

## 2018-08-26 ENCOUNTER — Encounter (HOSPITAL_COMMUNITY): Payer: Self-pay

## 2018-08-26 ENCOUNTER — Ambulatory Visit (HOSPITAL_COMMUNITY): Payer: Medicare Other

## 2018-08-26 DIAGNOSIS — R262 Difficulty in walking, not elsewhere classified: Secondary | ICD-10-CM | POA: Diagnosis not present

## 2018-08-26 DIAGNOSIS — R600 Localized edema: Secondary | ICD-10-CM | POA: Diagnosis not present

## 2018-08-26 DIAGNOSIS — M25662 Stiffness of left knee, not elsewhere classified: Secondary | ICD-10-CM | POA: Diagnosis not present

## 2018-08-26 DIAGNOSIS — M6281 Muscle weakness (generalized): Secondary | ICD-10-CM | POA: Diagnosis not present

## 2018-08-26 DIAGNOSIS — M25562 Pain in left knee: Secondary | ICD-10-CM

## 2018-08-26 NOTE — Therapy (Signed)
[facility], [place], [address] Phone: [phone]   Fax:  [phone]  Physical Therapy Treatment  Patient Details  Name: Joseph [name] MRN: [id number] Date of Birth: [birth year] Referring Provider: Edmonia Lynch, MD   Encounter Date: 08/26/2018  PT End of Session - 08/26/18 1821    Visit Number  9    Number of Visits  19    Date for PT Re-Evaluation  09/03/18   Minireassess complete 08/12/18   Authorization Type  Medicare    Authorization Time Period  07/23/18 to 09/03/18    Authorization - Visit Number  5    Authorization - Number of Visits  10    PT Start Time  1017    PT Stop Time  5102    PT Time Calculation (min)  46 min    Activity Tolerance  Patient tolerated treatment well;No increased pain    Behavior During Therapy  WFL for tasks assessed/performed       Past Medical History:  Diagnosis Date  . Anxiety   . Arthritis    oa left knee and lower back  . Asthma   . Cataract    hx of bilateral  . Chronic back pain   . Chronic pain of left knee   . COPD (chronic obstructive pulmonary disease) ([place])   . Diabetes mellitus    type 2  . GERD (gastroesophageal reflux disease)   . INGUINAL PAIN, LEFT 10/03/2009   Qualifier: Diagnosis of  By: Oneida Alar MD, Sandi L   . Overdose    03-24-18 accidental  . Shortness of breath    with heavy exertion  . Sleep apnea     Past Surgical History:  Procedure Laterality Date  . CARPAL TUNNEL RELEASE Left   . CATARACT EXTRACTION W/PHACO Right 03/11/2016   Procedure: CATARACT EXTRACTION PHACO AND INTRAOCULAR LENS PLACEMENT (IOC);  Surgeon: Tonny Branch, MD;  Location: [facility];  Service: Ophthalmology;  Laterality: Right;  CDE 4.24  . EYE SURGERY Right 2018   ioc with lens replacement  . HERNIA REPAIR Bilateral 5852   umbilical  . KNEE ARTHROSCOPY WITH MEDIAL MENISECTOMY Left 04/22/2014   Procedure: LEFT KNEE ARTHROSCOPY WITH MEDIAL MENISECTOMY;  Surgeon: Carole Civil, MD;  Location: [facility];  Service: Orthopedics;  Laterality: Left;  . KNEE ARTHROSCOPY WITH MEDIAL MENISECTOMY Right 12/06/2015   Procedure: RIGHT KNEE ARTHROSCOPY WITH MEDIAL MENISECTOMY;  Surgeon: Carole Civil, MD;  Location: [facility];  Service: Orthopedics;  Laterality: Right;  . KNEE ARTHROSCOPY WITH MEDIAL MENISECTOMY Left 03/20/2017   Procedure: KNEE ARTHROSCOPY WITH MEDIAL MENISECTOMY;  Surgeon: Carole Civil, MD;  Location: [facility];  Service: Orthopedics;  Laterality: Left;  . ORIF WRIST FRACTURE Left 12/19/2016   Procedure: OPEN REDUCTION INTERNAL FIXATION (ORIF) WRIST FRACTURE;  Surgeon: Leanora Cover, MD;  Location: [facility];  Service: Orthopedics;  Laterality: Left;  accumed   . PARTIAL KNEE ARTHROPLASTY Left 07/21/2018   Procedure: LEFT UNICOMPARTMENTAL KNEE;  Surgeon: Renette Butters, MD;  Location: [facility];  Service: Orthopedics;  Laterality: Left;  . SHOULDER ARTHROCENTESIS Right 2008 or 2010  . VASECTOMY  2014    There were no vitals filed for this visit.  Subjective Assessment - 08/26/18 1738    Subjective  Went to MD on Monday, stated MD happy with PT sessions just to reduce the amount of walking daily.  Pt stated he feels like a charlie horse on posterior knee.  Pain scale 5/10.    Patient Stated Goals  get L knee fixed/back to normal    Currently in Pain?  Yes    Pain Score  5     Pain Location  Knee    Pain Orientation  Left    Pain Descriptors / Indicators  Tightness;Sharp    Pain Type  Surgical pain    Pain Onset  More than a month ago    Pain Frequency  Intermittent    Aggravating Factors   bending, WB, turning wrong way    Pain Relieving Factors  medication, ice pack    Effect of Pain on Daily Activities  no prolonged standing or walking                       [facility] Adult PT Treatment/Exercise - 08/26/18 0001      Exercises   Exercises  Knee/Hip      Knee/Hip Exercises: Stretches   Active Hamstring Stretch   Left;3 reps;30 seconds;Limitations    Active Hamstring Stretch Limitations  standing 12in step    Gastroc Stretch  3 reps;30 seconds    Gastroc Stretch Limitations  slant board      Knee/Hip Exercises: Standing   Heel Raises  Both;20 reps    Heel Raises Limitations  on slope    Lateral Step Up  Left;Hand Hold: 1;Step Height: 4";10 reps    Forward Step Up  Left;15 reps;Hand Hold: 1;Step Height: 6"    Wall Squat  10 reps;3 seconds    Rocker Board  2 minutes    Rocker Board Limitations  lateral; DF/PF    SLS  3 sets      Knee/Hip Exercises: Seated   Sit to Sand  10 reps;without UE support      Manual Therapy   Manual Therapy  Soft tissue mobilization;Joint mobilization    Manual therapy comments  completed seperately from all other skilled interventions    Joint Mobilization  patella mobs all directions- pain free     Soft tissue mobilization  Soft tissue mobilization to hamstrings and gastroc in prone               PT Short Term Goals - 08/12/18 0819      PT SHORT TERM GOAL #1   Title  Pt will have decreased L knee edema by 3cm or > in order to decrease pain and improve ROM.     Baseline  9/11: decreased by 2cm thus far    Status  Partially Met      PT SHORT TERM GOAL #2   Title  Pt will have improved L knee AROM from 10-95deg in order to decrease pain and maximize gait.    Baseline  9/11: 5-118deg    Status  Achieved      PT SHORT TERM GOAL #3   Title  Pt will have 1/2 grade improvement throughout MMT in order to decrease pain and maximize gait.     Baseline  9/11: see MMT    Status  Partially Met      PT SHORT TERM GOAL #4   Title  Pt will be able to perform 5xSTS in 15 sec or < without UE and with proper mechanics to demo improved balance and functional strength.    Baseline  9/11: 11.4sec, no UE, decreased weight shift off of LLE    Status  Partially Met        PT Long Term Goals -  08/12/18 0819      PT LONG TERM GOAL #1   Title  Pt will have improved L  knee AROM from 0-115deg in order to further decrease pain and maximize stair ambulation.    Baseline  9/11: 5-118deg    Time  6    Period  Weeks    Status  Partially Met      PT LONG TERM GOAL #2   Title  Pt will have 1 grade improvement throughout MMT in order to further decrease pain and maximize overall functional mobility.    Baseline  9/11: see MMT    Time  6    Period  Weeks    Status  Partially Met      PT LONG TERM GOAL #3   Title  Pt will be able to ambulate 549f or > during 3MWT with LRAD and gait WFL in order to demo improved functional mobility and maximize community access.    Baseline  9/11: 761f no AD, antalgic gait, 1 L knee buckle, trendelenberg    Time  6    Period  Weeks    Status  Partially Met      PT LONG TERM GOAL #4   Title  Pt will be able to perform L SLS for 15 sec without UE support in order to demo improved balance and maximize his gait on uneven ground and stair ambulation.    Baseline  9/11: 12sec    Time  6    Period  Weeks    Status  On-going            Plan - 08/26/18 1822    Clinical Impression Statement  Continued with session focus with LE strenghtening and manual technqiues to assist with pain control.  Gait training complete this session wiht no AD, cueing to improve stride length and equal stance phase wiht gait.  Add wall squats and increased height for quad strengthening, pt able to perform with cueing for mechanics iwht new exercise.  EOS with soft tissue mobilization to address the "charlie horse" posterior knee, pt with spasm lateral hamstrings that was resolved following manual STM.  No reports of increased pain at EOS.      Rehab Potential  Good    PT Frequency  3x / week    PT Duration  6 weeks    PT Treatment/Interventions  ADLs/Self Care Home Management;Aquatic Therapy;Cryotherapy;Electrical Stimulation;Moist Heat;Traction;Biofeedback;Ultrasound;DME Instruction;Gait training;Stair training;Functional mobility  training;Therapeutic activities;Therapeutic exercise;Balance training;Neuromuscular re-education;Patient/family education;Scar mobilization;Passive range of motion;Compression bandaging;Dry needling;Energy conservation;Taping    PT Next Visit Plan  Continue to progress strengthening slowly keeping pain levels low.  COntinue wiht focus on managing edema and improve remaining deficits wiht ROM (extension.)    PT Home Exercise Plan  eval: quad sets, heel slides; 08/19/18:  TKE and calf stretches       Patient will benefit from skilled therapeutic intervention in order to improve the following deficits and impairments:  Abnormal gait, Decreased activity tolerance, Decreased balance, Decreased mobility, Decreased range of motion, Decreased scar mobility, Decreased strength, Difficulty walking, Hypomobility, Increased edema, Increased fascial restricitons, Increased muscle spasms, Impaired flexibility, Improper body mechanics, Pain  Visit Diagnosis: Stiffness of left knee, not elsewhere classified  Localized edema  Muscle weakness (generalized)  Difficulty in walking, not elsewhere classified  Acute pain of left knee     Problem List Patient Active Problem List   Diagnosis Date Noted  . Primary osteoarthritis of knee 07/21/2018  . Status post unicompartmental knee  replacement, left 07/21/2018  . Medicare annual wellness visit, initial 05/20/2018  . Effusion of knee joint (Left) 04/21/2018  . Opiate overdose ([place]) 03/24/2018  . Acute encephalopathy 03/24/2018  . Encephalopathy acute 03/24/2018  . Arthropathy of knee (Left) 01/12/2018  . Opioid-induced constipation ([facility]) 01/12/2018  . Tinea corporis 01/07/2018  . Tinea versicolor 01/07/2018  . Pharmacologic therapy 12/08/2017  . Problems influencing health status 12/08/2017  . Long term prescription opiate use 12/08/2017  . Opiate use 12/08/2017  . Insomnia 11/20/2017  . Disorder of skeletal system 11/05/2017  . Chronic knee pain  (Primary Area of Pain) (Bilateral) (L>R) 11/05/2017  . Chronic wrist pain (Secondary Area of Pain) (Left) 11/05/2017  . Chronic constipation 08/18/2017  . Hyperlipidemia 05/12/2017  . COPD without exacerbation ([place]) 05/07/2017  . Male hypogonadism 04/24/2017  . Post-void dribbling 04/01/2017  . Osteoarthritis of the knee (Left)   . Skin lesions 03/12/2017  . GERD (gastroesophageal reflux disease) 03/12/2017  . Bucket handle tear of meniscus of knee (Right)   . Anxiety and depression 10/07/2015  . Morbid obesity ([place]) 10/06/2015  . OSA (obstructive sleep apnea) 08/31/2014  . Testosterone deficiency 07/01/2014  . S/P knee surgery 04/26/2014  . Meniscal tear of knee, sequela (Left) 04/26/2014  . Medial meniscus, posterior horn derangement 04/22/2014  . Intrinsic asthma 12/19/2013  . Chronic pain syndrome 05/13/2013  . Diabetes ([place]) 05/13/2013  . Mediastinal abnormality 04/10/2012  . Cigarette smoker 04/10/2012   Ihor Austin, [place]; [place]  Aldona Lento 08/26/2018, 6:26 PM  [facility] [address] [place], [place], [address] Phone: [phone]   Fax:  [phone]  Name: Joseph [name] MRN: [id number] Date of Birth: 1971-09-01

## 2018-08-28 ENCOUNTER — Encounter (HOSPITAL_COMMUNITY): Payer: Self-pay

## 2018-08-28 ENCOUNTER — Ambulatory Visit (HOSPITAL_COMMUNITY): Payer: Medicare Other

## 2018-08-28 ENCOUNTER — Other Ambulatory Visit: Payer: Self-pay | Admitting: Primary Care

## 2018-08-28 DIAGNOSIS — R600 Localized edema: Secondary | ICD-10-CM

## 2018-08-28 DIAGNOSIS — M6281 Muscle weakness (generalized): Secondary | ICD-10-CM | POA: Diagnosis not present

## 2018-08-28 DIAGNOSIS — R262 Difficulty in walking, not elsewhere classified: Secondary | ICD-10-CM | POA: Diagnosis not present

## 2018-08-28 DIAGNOSIS — M25662 Stiffness of left knee, not elsewhere classified: Secondary | ICD-10-CM | POA: Diagnosis not present

## 2018-08-28 DIAGNOSIS — G4700 Insomnia, unspecified: Secondary | ICD-10-CM

## 2018-08-28 DIAGNOSIS — M25562 Pain in left knee: Secondary | ICD-10-CM | POA: Diagnosis not present

## 2018-08-28 NOTE — Therapy (Signed)
[facility] [place], [place], [address] Phone: [phone]   Fax:  [phone]  Physical Therapy Treatment  Patient Details  Name: Joseph [name] MRN: [id number] Date of Birth: [birth year] Referring Provider (PT): Edmonia Lynch, MD   Encounter Date: 08/28/2018  PT End of Session - 08/28/18 1607    Visit Number  10    Number of Visits  19    Date for PT Re-Evaluation  09/03/18   MInireassess copmplete on 08/12/2018   Authorization Type  Medicare    Authorization Time Period  07/23/18 to 09/03/18    Authorization - Visit Number  6    Authorization - Number of Visits  10    PT Start Time  5465    PT Stop Time  0354    PT Time Calculation (min)  39 min    Activity Tolerance  Patient tolerated treatment well;No increased pain    Behavior During Therapy  WFL for tasks assessed/performed       Past Medical History:  Diagnosis Date  . Anxiety   . Arthritis    oa left knee and lower back  . Asthma   . Cataract    hx of bilateral  . Chronic back pain   . Chronic pain of left knee   . COPD (chronic obstructive pulmonary disease) ([place])   . Diabetes mellitus    type 2  . GERD (gastroesophageal reflux disease)   . INGUINAL PAIN, LEFT 10/03/2009   Qualifier: Diagnosis of  By: Oneida Alar MD, Sandi L   . Overdose    03-24-18 accidental  . Shortness of breath    with heavy exertion  . Sleep apnea     Past Surgical History:  Procedure Laterality Date  . CARPAL TUNNEL RELEASE Left   . CATARACT EXTRACTION W/PHACO Right 03/11/2016   Procedure: CATARACT EXTRACTION PHACO AND INTRAOCULAR LENS PLACEMENT (IOC);  Surgeon: Tonny Branch, MD;  Location: [facility];  Service: Ophthalmology;  Laterality: Right;  CDE 4.24  . EYE SURGERY Right 2018   ioc with lens replacement  . HERNIA REPAIR Bilateral 6568   umbilical  . KNEE ARTHROSCOPY WITH MEDIAL MENISECTOMY Left 04/22/2014   Procedure: LEFT KNEE ARTHROSCOPY WITH MEDIAL MENISECTOMY;  Surgeon:  Carole Civil, MD;  Location: [facility];  Service: Orthopedics;  Laterality: Left;  . KNEE ARTHROSCOPY WITH MEDIAL MENISECTOMY Right 12/06/2015   Procedure: RIGHT KNEE ARTHROSCOPY WITH MEDIAL MENISECTOMY;  Surgeon: Carole Civil, MD;  Location: [facility];  Service: Orthopedics;  Laterality: Right;  . KNEE ARTHROSCOPY WITH MEDIAL MENISECTOMY Left 03/20/2017   Procedure: KNEE ARTHROSCOPY WITH MEDIAL MENISECTOMY;  Surgeon: Carole Civil, MD;  Location: [facility];  Service: Orthopedics;  Laterality: Left;  . ORIF WRIST FRACTURE Left 12/19/2016   Procedure: OPEN REDUCTION INTERNAL FIXATION (ORIF) WRIST FRACTURE;  Surgeon: Leanora Cover, MD;  Location: [facility];  Service: Orthopedics;  Laterality: Left;  accumed   . PARTIAL KNEE ARTHROPLASTY Left 07/21/2018   Procedure: LEFT UNICOMPARTMENTAL KNEE;  Surgeon: Renette Butters, MD;  [facility];  Service: Orthopedics;  Laterality: Left;  . SHOULDER ARTHROCENTESIS Right 2008 or 2010  . VASECTOMY  2014    There were no vitals filed for this visit.  Subjective Assessment - 08/28/18 1606    Subjective  Pt stated knee was stiff this morning.  Reports no longer feels that charlie horse following manual last session.  Pain scale 3/10 mainly soreness today  Patient Stated Goals  get L knee fixed/back to normal    Currently in Pain?  Yes    Pain Score  3     Pain Location  Knee    Pain Orientation  Left    Pain Descriptors / Indicators  Sore;Aching;Tightness    Pain Type  Surgical pain    Pain Onset  More than a month ago    Pain Frequency  Intermittent    Aggravating Factors   bending, WB, turning wrong way    Pain Relieving Factors  medication, ice pack    Effect of Pain on Daily Activities  no prolonged standing of walking.                         [facility] Adult PT Treatment/Exercise - 08/28/18 0001      Exercises   Exercises  Knee/Hip      Knee/Hip Exercises: Stretches   Active Hamstring Stretch  Left;3  reps;30 seconds;Limitations    Active Hamstring Stretch Limitations  supine with rope    Gastroc Stretch  3 reps;30 seconds    Gastroc Stretch Limitations  slant board      Knee/Hip Exercises: Standing   Heel Raises  Both;20 reps    Heel Raises Limitations  on slope    Lateral Step Up  Left;Hand Hold: 1;Step Height: 4";15 reps    Forward Step Up  Left;15 reps;Hand Hold: 1;Step Height: 6"    Step Down  Left;10 reps;Hand Hold: 1;Step Height: 6"    Functional Squat  2 sets;10 reps    Functional Squat Limitations  cueing for equal weight bearing, use with 2in under Rt knee    SLS  Rt 60", Lt 45" max    SLS with Vectors  standing Lt SLS 5x 5" each direction    Other Standing Knee Exercises  sidestep 3RT GTB      Manual Therapy   Manual Therapy  Edema management;Joint mobilization;Soft tissue mobilization    Manual therapy comments  completed seperately from all other skilled interventions    Edema Management  L knee for edema control with BLE elevated    Joint Mobilization  patella mobs all directions- pain free     Soft tissue mobilization  Soft tissue mobilization to distal quad to reduce tightness                PT Short Term Goals - 08/12/18 0819      PT SHORT TERM GOAL #1   Title  Pt will have decreased L knee edema by 3cm or > in order to decrease pain and improve ROM.     Baseline  9/11: decreased by 2cm thus far    Status  Partially Met      PT SHORT TERM GOAL #2   Title  Pt will have improved L knee AROM from 10-95deg in order to decrease pain and maximize gait.    Baseline  9/11: 5-118deg    Status  Achieved      PT SHORT TERM GOAL #3   Title  Pt will have 1/2 grade improvement throughout MMT in order to decrease pain and maximize gait.     Baseline  9/11: see MMT    Status  Partially Met      PT SHORT TERM GOAL #4   Title  Pt will be able to perform 5xSTS in 15 sec or < without UE and with proper mechanics to demo improved balance and  functional strength.     Baseline  9/11: 11.4sec, no UE, decreased weight shift off of LLE    Status  Partially Met        PT Long Term Goals - 08/12/18 0819      PT LONG TERM GOAL #1   Title  Pt will have improved L knee AROM from 0-115deg in order to further decrease pain and maximize stair ambulation.    Baseline  9/11: 5-118deg    Time  6    Period  Weeks    Status  Partially Met      PT LONG TERM GOAL #2   Title  Pt will have 1 grade improvement throughout MMT in order to further decrease pain and maximize overall functional mobility.    Baseline  9/11: see MMT    Time  6    Period  Weeks    Status  Partially Met      PT LONG TERM GOAL #3   Title  Pt will be able to ambulate 566f or > during 3MWT with LRAD and gait WFL in order to demo improved functional mobility and maximize community access.    Baseline  9/11: 7628f no AD, antalgic gait, 1 L knee buckle, trendelenberg    Time  6    Period  Weeks    Status  Partially Met      PT LONG TERM GOAL #4   Title  Pt will be able to perform L SLS for 15 sec without UE support in order to demo improved balance and maximize his gait on uneven ground and stair ambulation.    Baseline  9/11: 12sec    Time  6    Period  Weeks    Status  On-going            Plan - 08/28/18 1636    Clinical Impression Statement  Progressed this session with advanced exercises for functional strengthening including balance activities for hip stability and began step down training for eccentric quad strengthening.  Pt required cueing for equal weight bearing with squats today, used verbal cueing to improve awareness and use of 2in step to increased weight bearing.  Pt impressive wiht SLS activities, began vector stance and sidestep wiht GTB for hip strengthening/stability.  No use of AD and improved equalized stance phase and stride length.  EOS wiht soft tissue mobilization and edema control to address restrictions in quad and edema control for pain.      Rehab Potential   Good    PT Frequency  3x / week    PT Duration  6 weeks    PT Treatment/Interventions  ADLs/Self Care Home Management;Aquatic Therapy;Cryotherapy;Electrical Stimulation;Moist Heat;Traction;Biofeedback;Ultrasound;DME Instruction;Gait training;Stair training;Functional mobility training;Therapeutic activities;Therapeutic exercise;Balance training;Neuromuscular re-education;Patient/family education;Scar mobilization;Passive range of motion;Compression bandaging;Dry needling;Energy conservation;Taping    PT Next Visit Plan  Continue to progress strengthening slowly keeping pain levels low.  COntinue wiht focus on managing edema and improve remaining deficits wiht ROM (extension.)    PT Home Exercise Plan  eval: quad sets, heel slides; 08/19/18:  TKE and calf stretches       Patient will benefit from skilled therapeutic intervention in order to improve the following deficits and impairments:  Abnormal gait, Decreased activity tolerance, Decreased balance, Decreased mobility, Decreased range of motion, Decreased scar mobility, Decreased strength, Difficulty walking, Hypomobility, Increased edema, Increased fascial restricitons, Increased muscle spasms, Impaired flexibility, Improper body mechanics, Pain  Visit Diagnosis: Stiffness of left knee, not elsewhere classified  Localized  edema  Muscle weakness (generalized)  Difficulty in walking, not elsewhere classified  Acute pain of left knee     Problem List Patient Active Problem List   Diagnosis Date Noted  . Primary osteoarthritis of knee 07/21/2018  . Status post unicompartmental knee replacement, left 07/21/2018  . Medicare annual wellness visit, initial 05/20/2018  . Effusion of knee joint (Left) 04/21/2018  . Opiate overdose ([place]) 03/24/2018  . Acute encephalopathy 03/24/2018  . Encephalopathy acute 03/24/2018  . Arthropathy of knee (Left) 01/12/2018  . Opioid-induced constipation ([facility]) 01/12/2018  . Tinea corporis 01/07/2018  .  Tinea versicolor 01/07/2018  . Pharmacologic therapy 12/08/2017  . Problems influencing health status 12/08/2017  . Long term prescription opiate use 12/08/2017  . Opiate use 12/08/2017  . Insomnia 11/20/2017  . Disorder of skeletal system 11/05/2017  . Chronic knee pain (Primary Area of Pain) (Bilateral) (L>R) 11/05/2017  . Chronic wrist pain (Secondary Area of Pain) (Left) 11/05/2017  . Chronic constipation 08/18/2017  . Hyperlipidemia 05/12/2017  . COPD without exacerbation ([place]) 05/07/2017  . Male hypogonadism 04/24/2017  . Post-void dribbling 04/01/2017  . Osteoarthritis of the knee (Left)   . Skin lesions 03/12/2017  . GERD (gastroesophageal reflux disease) 03/12/2017  . Bucket handle tear of meniscus of knee (Right)   . Anxiety and depression 10/07/2015  . Morbid obesity ([place]) 10/06/2015  . OSA (obstructive sleep apnea) 08/31/2014  . Testosterone deficiency 07/01/2014  . S/P knee surgery 04/26/2014  . Meniscal tear of knee, sequela (Left) 04/26/2014  . Medial meniscus, posterior horn derangement 04/22/2014  . Intrinsic asthma 12/19/2013  . Chronic pain syndrome 05/13/2013  . Diabetes ([place]) 05/13/2013  . Mediastinal abnormality 04/10/2012  . Cigarette smoker 04/10/2012   Ihor Austin, [place]; [place]  Aldona Lento 08/28/2018, 4:51 PM  [facility] [address] [place], [place], [address] Phone: [phone]   Fax:  [phone]  Name: Joseph [name] MRN: [id number] Date of Birth: Jun 04, 1971

## 2018-08-31 ENCOUNTER — Encounter (HOSPITAL_COMMUNITY): Payer: Medicare Other | Admitting: Physical Therapy

## 2018-09-01 ENCOUNTER — Other Ambulatory Visit: Payer: Self-pay | Admitting: Primary Care

## 2018-09-01 DIAGNOSIS — E119 Type 2 diabetes mellitus without complications: Secondary | ICD-10-CM

## 2018-09-02 ENCOUNTER — Ambulatory Visit (HOSPITAL_COMMUNITY): Payer: Medicare Other

## 2018-09-02 ENCOUNTER — Telehealth (HOSPITAL_COMMUNITY): Payer: Self-pay | Admitting: Primary Care

## 2018-09-02 NOTE — Telephone Encounter (Signed)
09/02/18  pt cx said he was "in a ton of pain this morning" and unsure as to why

## 2018-09-03 ENCOUNTER — Encounter (HOSPITAL_COMMUNITY): Payer: Self-pay

## 2018-09-03 ENCOUNTER — Ambulatory Visit (HOSPITAL_COMMUNITY): Payer: Medicare Other | Attending: Orthopedic Surgery

## 2018-09-03 DIAGNOSIS — R600 Localized edema: Secondary | ICD-10-CM | POA: Insufficient documentation

## 2018-09-03 DIAGNOSIS — M25662 Stiffness of left knee, not elsewhere classified: Secondary | ICD-10-CM | POA: Insufficient documentation

## 2018-09-03 DIAGNOSIS — M25562 Pain in left knee: Secondary | ICD-10-CM | POA: Insufficient documentation

## 2018-09-03 DIAGNOSIS — M6281 Muscle weakness (generalized): Secondary | ICD-10-CM | POA: Diagnosis not present

## 2018-09-03 DIAGNOSIS — R262 Difficulty in walking, not elsewhere classified: Secondary | ICD-10-CM

## 2018-09-03 NOTE — Therapy (Signed)
[place], [place], [address] Phone: [phone]   Fax:  [phone]   Progress Note Reporting Period 08/12/18 to 09/03/18  See note below for Objective Data and Assessment of Progress/Goals.    Physical Therapy Treatment  Patient Details  Name: Joseph [name] MRN: [id number] Date of Birth: 08-16-1971 Referring Provider (PT): Edmonia Lynch, MD   Encounter Date: 09/03/2018  PT End of Session - 09/03/18 1034    Visit Number  11    Number of Visits  23    Date for PT Re-Evaluation  09/17/18    Authorization Type  Medicare    Authorization Time Period  07/23/18 to 09/03/18; NEW: 09/03/18 to 09/17/18    Authorization - Visit Number  11    Authorization - Number of Visits  23    PT Start Time  7824    PT Stop Time  1113    PT Time Calculation (min)  40 min    Activity Tolerance  Patient tolerated treatment well;No increased pain    Behavior During Therapy  WFL for tasks assessed/performed       Past Medical History:  Diagnosis Date  . Anxiety   . Arthritis    oa left knee and lower back  . Asthma   . Cataract    hx of bilateral  . Chronic back pain   . Chronic pain of left knee   . COPD (chronic obstructive pulmonary disease) ([place])   . Diabetes mellitus    type 2  . GERD (gastroesophageal reflux disease)   . INGUINAL PAIN, LEFT 10/03/2009   Qualifier: Diagnosis of  By: Oneida Alar MD, Sandi L   . Overdose    03-24-18 accidental  . Shortness of breath    with heavy exertion  . Sleep apnea     Past Surgical History:  Procedure Laterality Date  . CARPAL TUNNEL RELEASE Left   . CATARACT EXTRACTION W/PHACO Right 03/11/2016   Procedure: CATARACT EXTRACTION PHACO AND INTRAOCULAR LENS PLACEMENT (IOC);  Surgeon: Tonny Branch, MD;  Location: [facility];  Service: Ophthalmology;  Laterality: Right;  CDE 4.24  . EYE SURGERY Right 2018   ioc with lens replacement  . HERNIA REPAIR Bilateral 2353   umbilical  . KNEE  ARTHROSCOPY WITH MEDIAL MENISECTOMY Left 04/22/2014   Procedure: LEFT KNEE ARTHROSCOPY WITH MEDIAL MENISECTOMY;  Surgeon: Carole Civil, MD;  Location: [facility];  Service: Orthopedics;  Laterality: Left;  . KNEE ARTHROSCOPY WITH MEDIAL MENISECTOMY Right 12/06/2015   Procedure: RIGHT KNEE ARTHROSCOPY WITH MEDIAL MENISECTOMY;  Surgeon: Carole Civil, MD;  Location: [facility];  Service: Orthopedics;  Laterality: Right;  . KNEE ARTHROSCOPY WITH MEDIAL MENISECTOMY Left 03/20/2017   Procedure: KNEE ARTHROSCOPY WITH MEDIAL MENISECTOMY;  Surgeon: Carole Civil, MD;  Location: [facility];  Service: Orthopedics;  Laterality: Left;  . ORIF WRIST FRACTURE Left 12/19/2016   Procedure: OPEN REDUCTION INTERNAL FIXATION (ORIF) WRIST FRACTURE;  Surgeon: Leanora Cover, MD;  Location: [facility];  Service: Orthopedics;  Laterality: Left;  accumed   . PARTIAL KNEE ARTHROPLASTY Left 07/21/2018   Procedure: LEFT UNICOMPARTMENTAL KNEE;  Surgeon: Renette Butters, MD;  Location: [facility];  Service: Orthopedics;  Laterality: Left;  . SHOULDER ARTHROCENTESIS Right 2008 or 2010  . VASECTOMY  2014    There were no vitals filed for this visit.  Subjective Assessment - 09/03/18 1034    Subjective  Pt states that the charlie horse in the  back of his knee keeps happening. He was in a lot of pain yesterday becuase of it which is why he cancelled. He has about 3/10 achy pain now.     Patient Stated Goals  get L knee fixed/back to normal    Currently in Pain?  Yes    Pain Score  3     Pain Location  Knee    Pain Orientation  Left    Pain Descriptors / Indicators  Aching;Dull    Pain Type  Surgical pain    Pain Onset  More than a month ago    Pain Frequency  Intermittent    Aggravating Factors   bending, WB, turning wrong way    Pain Relieving Factors  medication, ice pack    Effect of Pain on Daily Activities  no prolonged standing or walking         [facility] PT Assessment - 09/03/18 0001      Assessment    Medical Diagnosis  L partial TKA    Referring Provider (PT)  Edmonia Lynch, MD    Onset Date/Surgical Date  07/21/18    Next MD Visit  not sure    Prior Therapy  yes for R shoulder (2008), none for present issue      Observation/Other Assessments   Focus on Therapeutic Outcomes (FOTO)   50% limited   was 54% limited     Circumferential Edema   Circumferential - Left   38cm, joint line   was 40.5cm last reassessment and 42.5cm initial eval     AROM   Left Knee Extension  0   was 3   Left Knee Flexion  130   was 123     Strength   Right Hip Extension  5/5   was 4+   Right Hip ABduction  5/5   wa s4+   Left Hip Extension  4+/5   was 4   Left Hip ABduction  4+/5   was 4   Left Knee Flexion  5/5   was 4   Left Knee Extension  4+/5   was 4+     Palpation   Palpation comment  L HS felt comprable/symmetrical to R HS with regard to restrictions and taut bands; small taut band at medial L knee cap       Ambulation/Gait   Ambulation Distance (Feet)  724 Feet   3MWT; was 722f   Assistive device  None    Gait Pattern  Step-through pattern;Antalgic   mild antalgia     Balance   Balance Assessed  Yes      Static Standing Balance   Static Standing - Balance Support  No upper extremity supported    Static Standing Balance -  Activities   Single Leg Stance - Left Leg    Static Standing - Comment/# of Minutes  L: 22.3sec   was 12sec LLE     Standardized Balance Assessment   Standardized Balance Assessment  Five Times Sit to Stand    Five times sit to stand comments   9.28sec, no UE, improved weight shift over LLE   was 11.4sec            [facility] Adult PT Treatment/Exercise - 09/03/18 0001      Exercises   Exercises  Knee/Hip      Knee/Hip Exercises: Stretches   Passive Hamstring Stretch  Left;5 reps;Both    Passive Hamstring Stretch Limitations  hands behind thigh  Knee/Hip Exercises: Supine   Bridges  Both;20 reps      Knee/Hip Exercises: Sidelying    Hip ABduction  Left;20 reps            PT Education - 09/03/18 1036    Education Details  reassessment findings, brief extension and then d/c at the end of that    Person(s) Educated  Patient    Methods  Explanation;Demonstration;Handout    Comprehension  Verbalized understanding;Returned demonstration       PT Short Term Goals - 09/03/18 1036      PT SHORT TERM GOAL #1   Title  Pt will have decreased L knee edema by 3cm or > in order to decrease pain and improve ROM.     Baseline  10/3: decreased by approximately 4cm, symmetrical with RLE    Status  Achieved      PT SHORT TERM GOAL #2   Title  Pt will have improved L knee AROM from 10-95deg in order to decrease pain and maximize gait.    Baseline  10/3; 0-130deg    Status  Achieved      PT SHORT TERM GOAL #3   Title  Pt will have 1/2 grade improvement throughout MMT in order to decrease pain and maximize gait.     Baseline  10/3: see MMT    Status  Achieved      PT SHORT TERM GOAL #4   Title  Pt will be able to perform 5xSTS in 15 sec or < without UE and with proper mechanics to demo improved balance and functional strength.    Baseline  10/3: 9.28sec, no UE, improved weight shift over LLE    Status  Achieved      PT SHORT TERM GOAL #5   Title  Pt will be independent with advanced HEP and verbalize understanding and regular participation in order to maximize strength and decrease pain.    Time  2    Period  Weeks    Status  New    Target Date  09/17/18      Additional Short Term Goals   Additional Short Term Goals  Yes      PT SHORT TERM GOAL #6   Title  Pt will report no bouts of L HS cramping/"charlie horse" and no bouts of L knee buckling over the last 7 days in order to decrease his risk for falls and demo improved overall function.    Time  2    Period  Weeks    Status  New        PT Long Term Goals - 09/03/18 1037      PT LONG TERM GOAL #1   Title  Pt will have improved L knee AROM from 0-115deg in  order to further decrease pain and maximize stair ambulation.    Baseline  10/3: 0-130deg    Time  6    Period  Weeks    Status  Achieved      PT LONG TERM GOAL #2   Title  Pt will have 1 grade improvement throughout MMT in order to further decrease pain and maximize overall functional mobility.    Baseline  10/3: see MMT    Time  6    Period  Weeks    Status  Partially Met      PT LONG TERM GOAL #3   Title  Pt will be able to ambulate 556f or > during 3MWT with LRAD and gait  WFL in order to demo improved functional mobility and maximize community access.    Baseline  10/3: 770f, no AD, mild antalgic gait    Time  6    Period  Weeks    Status  Partially Met      PT LONG TERM GOAL #4   Title  Pt will be able to perform L SLS for 15 sec without UE support in order to demo improved balance and maximize his gait on uneven ground and stair ambulation.    Baseline  10/3: 22.3sec    Time  6    Period  Weeks    Status  Achieved            Plan - 09/03/18 1204    Clinical Impression Statement  PT reassessed pt's goals and outcome measures this date. Pt overall has made great progress as illustrated above. His AROM is fantastic at 0-130deg, his swelling has normalized and is symmetrical with his RLE, and his balance has improved as he performed L SLS for 23sec today. He still c/o L HS cramping/"Charlie horse" after increased activity and it occurs almost daily as well as continued increased pain at medial anterior knee. PT recommending brief extension of PT services to address remaining deficits in functional strength as well as resuming focus on soft tissue work in order to decrease pain and recurrence of L HS cramping and L knee buckling so as to decrease risk for reinjury. Sessions going forward will incorporate isolated hip strengthening (for balance and gait improvements), functional strengthening, machine strengthening, dynamic stability work, and manual therapy to L HS and L  anterior knee (i.e. potentially STM, MFR, ice massage, and/or dry needling, etc.) in order to decrease pain and maximize his return to PLOF. PT updated goals this date for him to progress towards over the next 2 weeks.    Rehab Potential  Good    PT Frequency  2x / week    PT Duration  2 weeks    PT Treatment/Interventions  ADLs/Self Care Home Management;Aquatic Therapy;Cryotherapy;Electrical Stimulation;Moist Heat;Traction;Biofeedback;Ultrasound;DME Instruction;Gait training;Stair training;Functional mobility training;Therapeutic activities;Therapeutic exercise;Balance training;Neuromuscular re-education;Patient/family education;Scar mobilization;Passive range of motion;Compression bandaging;Dry needling;Energy conservation;Taping    PT Next Visit Plan  progress HEP frequently with new exercises so he can manage independently after 2-week POC; initiate machine strengthening, hip strengthening, dynamic balance, and manual therapy for L HS and L anterior knee    PT Home Exercise Plan  eval: quad sets, heel slides; 08/19/18:  TKE and calf stretches; 10/3: Supine hamstring stretch (hand behind thigh), sidelying hip abd, supine bridge    Consulted and Agree with Plan of Care  Patient       Patient will benefit from skilled therapeutic intervention in order to improve the following deficits and impairments:  Abnormal gait, Decreased activity tolerance, Decreased balance, Decreased mobility, Decreased range of motion, Decreased scar mobility, Decreased strength, Difficulty walking, Hypomobility, Increased edema, Increased fascial restricitons, Increased muscle spasms, Impaired flexibility, Improper body mechanics, Pain  Visit Diagnosis: Stiffness of left knee, not elsewhere classified - Plan: PT plan of care cert/re-cert  Localized edema - Plan: PT plan of care cert/re-cert  Muscle weakness (generalized) - Plan: PT plan of care cert/re-cert  Difficulty in walking, not elsewhere classified - Plan: PT  plan of care cert/re-cert  Acute pain of left knee - Plan: PT plan of care cert/re-cert     Problem List Patient Active Problem List   Diagnosis Date Noted  . Primary osteoarthritis of knee 07/21/2018  .  Status post unicompartmental knee replacement, left 07/21/2018  . Medicare annual wellness visit, initial 05/20/2018  . Effusion of knee joint (Left) 04/21/2018  . Opiate overdose ([place]) 03/24/2018  . Acute encephalopathy 03/24/2018  . Encephalopathy acute 03/24/2018  . Arthropathy of knee (Left) 01/12/2018  . Opioid-induced constipation ([facility]) 01/12/2018  . Tinea corporis 01/07/2018  . Tinea versicolor 01/07/2018  . Pharmacologic therapy 12/08/2017  . Problems influencing health status 12/08/2017  . Long term prescription opiate use 12/08/2017  . Opiate use 12/08/2017  . Insomnia 11/20/2017  . Disorder of skeletal system 11/05/2017  . Chronic knee pain (Primary Area of Pain) (Bilateral) (L>R) 11/05/2017  . Chronic wrist pain (Secondary Area of Pain) (Left) 11/05/2017  . Chronic constipation 08/18/2017  . Hyperlipidemia 05/12/2017  . COPD without exacerbation ([place]) 05/07/2017  . Male hypogonadism 04/24/2017  . Post-void dribbling 04/01/2017  . Osteoarthritis of the knee (Left)   . Skin lesions 03/12/2017  . GERD (gastroesophageal reflux disease) 03/12/2017  . Bucket handle tear of meniscus of knee (Right)   . Anxiety and depression 10/07/2015  . Morbid obesity ([place]) 10/06/2015  . OSA (obstructive sleep apnea) 08/31/2014  . Testosterone deficiency 07/01/2014  . S/P knee surgery 04/26/2014  . Meniscal tear of knee, sequela (Left) 04/26/2014  . Medial meniscus, posterior horn derangement 04/22/2014  . Intrinsic asthma 12/19/2013  . Chronic pain syndrome 05/13/2013  . Diabetes ([place]) 05/13/2013  . Mediastinal abnormality 04/10/2012  . Cigarette smoker 04/10/2012        Geraldine Solar PT, DPT  [place], [place], [address] Phone: [phone]   Fax:  [phone]  Name: VIVIAN OKELLEY MRN: [id number] Date of Birth: 09/29/1971

## 2018-09-03 NOTE — Patient Instructions (Signed)
Access Code: [email or web address]  URL: [email or web address]  Date: 09/03/2018  Prepared by: Jac Canavan   Exercises . Supine Hamstring Stretch - 3-5 reps - 10-20sec hold - 1x daily - 7x weekly . Sidelying Hip Abduction - 10 reps - 3 sets - 1x daily - 7x weekly . Supine Bridge - 10 reps - 3 sets - 1x daily - 7x weekly

## 2018-09-08 ENCOUNTER — Ambulatory Visit (HOSPITAL_COMMUNITY): Payer: Medicare Other

## 2018-09-08 ENCOUNTER — Encounter (HOSPITAL_COMMUNITY): Payer: Self-pay

## 2018-09-08 DIAGNOSIS — M25662 Stiffness of left knee, not elsewhere classified: Secondary | ICD-10-CM | POA: Diagnosis not present

## 2018-09-08 DIAGNOSIS — R262 Difficulty in walking, not elsewhere classified: Secondary | ICD-10-CM | POA: Diagnosis not present

## 2018-09-08 DIAGNOSIS — R600 Localized edema: Secondary | ICD-10-CM | POA: Diagnosis not present

## 2018-09-08 DIAGNOSIS — M25562 Pain in left knee: Secondary | ICD-10-CM | POA: Diagnosis not present

## 2018-09-08 DIAGNOSIS — M6281 Muscle weakness (generalized): Secondary | ICD-10-CM | POA: Diagnosis not present

## 2018-09-08 NOTE — Therapy (Signed)
[facility] [place], [place], [address] Phone: [phone]   Fax:  [phone]  Physical Therapy Treatment  Patient Details  Name: Joseph [name] MRN: [id number] Date of Birth: 07/02/1971 Referring Provider (PT): Edmonia Lynch, MD   Encounter Date: 09/08/2018  PT End of Session - 09/08/18 1015    Visit Number  12    Number of Visits  23    Date for PT Re-Evaluation  09/17/18    Authorization Type  Medicare    Authorization Time Period  07/23/18 to 09/03/18; NEW: 09/03/18 to 09/17/18    Authorization - Visit Number  12    Authorization - Number of Visits  23    PT Start Time  0940    PT Stop Time  4401    PT Time Calculation (min)  47 min    Activity Tolerance  Patient tolerated treatment well;No increased pain    Behavior During Therapy  WFL for tasks assessed/performed       Past Medical History:  Diagnosis Date  . Anxiety   . Arthritis    oa left knee and lower back  . Asthma   . Cataract    hx of bilateral  . Chronic back pain   . Chronic pain of left knee   . COPD (chronic obstructive pulmonary disease) ([place])   . Diabetes mellitus    type 2  . GERD (gastroesophageal reflux disease)   . INGUINAL PAIN, LEFT 10/03/2009   Qualifier: Diagnosis of  By: Oneida Alar MD, Sandi L   . Overdose    03-24-18 accidental  . Shortness of breath    with heavy exertion  . Sleep apnea     Past Surgical History:  Procedure Laterality Date  . CARPAL TUNNEL RELEASE Left   . CATARACT EXTRACTION W/PHACO Right 03/11/2016   Procedure: CATARACT EXTRACTION PHACO AND INTRAOCULAR LENS PLACEMENT (IOC);  Surgeon: Tonny Branch, MD;  Location: [facility];  Service: Ophthalmology;  Laterality: Right;  CDE 4.24  . EYE SURGERY Right 2018   ioc with lens replacement  . HERNIA REPAIR Bilateral 0272   umbilical  . KNEE ARTHROSCOPY WITH MEDIAL MENISECTOMY Left 04/22/2014   Procedure: LEFT KNEE ARTHROSCOPY WITH MEDIAL MENISECTOMY;  Surgeon: Carole Civil, MD;  Location: [facility];  Service: Orthopedics;  Laterality: Left;  . KNEE ARTHROSCOPY WITH MEDIAL MENISECTOMY Right 12/06/2015   Procedure: RIGHT KNEE ARTHROSCOPY WITH MEDIAL MENISECTOMY;  Surgeon: Carole Civil, MD;  Location: [facility];  Service: Orthopedics;  Laterality: Right;  . KNEE ARTHROSCOPY WITH MEDIAL MENISECTOMY Left 03/20/2017   Procedure: KNEE ARTHROSCOPY WITH MEDIAL MENISECTOMY;  Surgeon: Carole Civil, MD;  Location: [facility];  Service: Orthopedics;  Laterality: Left;  . ORIF WRIST FRACTURE Left 12/19/2016   Procedure: OPEN REDUCTION INTERNAL FIXATION (ORIF) WRIST FRACTURE;  Surgeon: Leanora Cover, MD;  Location: [facility];  Service: Orthopedics;  Laterality: Left;  accumed   . PARTIAL KNEE ARTHROPLASTY Left 07/21/2018   Procedure: LEFT UNICOMPARTMENTAL KNEE;  Surgeon: Renette Butters, MD;  Location: [facility];  Service: Orthopedics;  Laterality: Left;  . SHOULDER ARTHROCENTESIS Right 2008 or 2010  . VASECTOMY  2014    There were no vitals filed for this visit.  Subjective Assessment - 09/08/18 0944    Subjective  Pt stated he has lost his pain medication on Friday, has looked through cars and entire home.  Current pain scale 2-3/10 medial aspect of knee, had a "charlie horse"  yesterday.    Patient Stated Goals  get L knee fixed/back to normal    Currently in Pain?  Yes    Pain Score  3     Pain Location  Knee    Pain Orientation  Left    Pain Descriptors / Indicators  Sharp    Pain Type  Surgical pain    Pain Onset  More than a month ago    Pain Frequency  Intermittent    Aggravating Factors   bending, WB. turning wrong way    Pain Relieving Factors  medication, ice pack    Effect of Pain on Daily Activities  no prolonged standing or walking                       [facility] Adult PT Treatment/Exercise - 09/08/18 0001      Exercises   Exercises  Knee/Hip      Knee/Hip Exercises: Machines for Strengthening   Cybex Knee Extension   1Pl 2x 10    Cybex Knee Flexion  4.5Pl 2x 10    Total Gym Leg Press  bodycraft 5Pl 2x10      Knee/Hip Exercises: Standing   SLS with Vectors  standing Lt SLS 5x 5" each direction on foam    Other Standing Knee Exercises  Palvo in tandem stance 15x each direction    Other Standing Knee Exercises  sidestep GTB 2RT      Manual Therapy   Manual Therapy  Soft tissue mobilization    Manual therapy comments  completed seperately from all other skilled interventions    Soft tissue mobilization  Soft tissue mobilization to distal quad to reduce tightness                PT Short Term Goals - 09/03/18 1036      PT SHORT TERM GOAL #1   Title  Pt will have decreased L knee edema by 3cm or > in order to decrease pain and improve ROM.     Baseline  10/3: decreased by approximately 4cm, symmetrical with RLE    Status  Achieved      PT SHORT TERM GOAL #2   Title  Pt will have improved L knee AROM from 10-95deg in order to decrease pain and maximize gait.    Baseline  10/3; 0-130deg    Status  Achieved      PT SHORT TERM GOAL #3   Title  Pt will have 1/2 grade improvement throughout MMT in order to decrease pain and maximize gait.     Baseline  10/3: see MMT    Status  Achieved      PT SHORT TERM GOAL #4   Title  Pt will be able to perform 5xSTS in 15 sec or < without UE and with proper mechanics to demo improved balance and functional strength.    Baseline  10/3: 9.28sec, no UE, improved weight shift over LLE    Status  Achieved      PT SHORT TERM GOAL #5   Title  Pt will be independent with advanced HEP and verbalize understanding and regular participation in order to maximize strength and decrease pain.    Time  2    Period  Weeks    Status  New    Target Date  09/17/18      Additional Short Term Goals   Additional Short Term Goals  Yes      PT SHORT TERM GOAL #  6   Title  Pt will report no bouts of L HS cramping/"charlie horse" and no bouts of L knee buckling over the last 7  days in order to decrease his risk for falls and demo improved overall function.    Time  2    Period  Weeks    Status  New        PT Long Term Goals - 09/03/18 1037      PT LONG TERM GOAL #1   Title  Pt will have improved L knee AROM from 0-115deg in order to further decrease pain and maximize stair ambulation.    Baseline  10/3: 0-130deg    Time  6    Period  Weeks    Status  Achieved      PT LONG TERM GOAL #2   Title  Pt will have 1 grade improvement throughout MMT in order to further decrease pain and maximize overall functional mobility.    Baseline  10/3: see MMT    Time  6    Period  Weeks    Status  Partially Met      PT LONG TERM GOAL #3   Title  Pt will be able to ambulate 526f or > during 3MWT with LRAD and gait WFL in order to demo improved functional mobility and maximize community access.    Baseline  10/3: 7249f no AD, mild antalgic gait    Time  6    Period  Weeks    Status  Partially Met      PT LONG TERM GOAL #4   Title  Pt will be able to perform L SLS for 15 sec without UE support in order to demo improved balance and maximize his gait on uneven ground and stair ambulation.    Baseline  10/3: 22.3sec    Time  6    Period  Weeks    Status  Achieved            Plan - 09/08/18 1121    Clinical Impression Statement  Session focus on functional strengthening and hip stability to improve dynamic balance.  Pt able to demonstrate appropriate mechanics no HHA required for ascending stairs, does continue to demonstrate weak eccentric control descending stairs.  Min assistance and cueing for mechanics with SLS activities.  Added palvo in tandem stance to improve core strengthening wiht static balance.  Added machines for LE strengthening including leg press, quad and hamstring machines.  Stretches focus on medial and lateral hamstrings to address tightness and manual complete to hamstrings to resolve spasms.  No reports of increased pain at EOS.    Rehab  Potential  Good    PT Frequency  2x / week    PT Duration  2 weeks    PT Treatment/Interventions  ADLs/Self Care Home Management;Aquatic Therapy;Cryotherapy;Electrical Stimulation;Moist Heat;Traction;Biofeedback;Ultrasound;DME Instruction;Gait training;Stair training;Functional mobility training;Therapeutic activities;Therapeutic exercise;Balance training;Neuromuscular re-education;Patient/family education;Scar mobilization;Passive range of motion;Compression bandaging;Dry needling;Energy conservation;Taping    PT Next Visit Plan  progress HEP frequently with new exercises so he can manage independently after 2-week POC; progress to single leg with machine strengthening as appropriate, hip strengthening, dynamic balance, and manual therapy for L HS and L anterior knee    PT Home Exercise Plan  eval: quad sets, heel slides; 08/19/18:  TKE and calf stretches; 10/3: Supine hamstring stretch (hand behind thigh), sidelying hip abd, supine bridge       Patient will benefit from skilled therapeutic intervention in order to improve the  following deficits and impairments:  Abnormal gait, Decreased activity tolerance, Decreased balance, Decreased mobility, Decreased range of motion, Decreased scar mobility, Decreased strength, Difficulty walking, Hypomobility, Increased edema, Increased fascial restricitons, Increased muscle spasms, Impaired flexibility, Improper body mechanics, Pain  Visit Diagnosis: Stiffness of left knee, not elsewhere classified  Localized edema  Muscle weakness (generalized)  Difficulty in walking, not elsewhere classified  Acute pain of left knee     Problem List Patient Active Problem List   Diagnosis Date Noted  . Primary osteoarthritis of knee 07/21/2018  . Status post unicompartmental knee replacement, left 07/21/2018  . Medicare annual wellness visit, initial 05/20/2018  . Effusion of knee joint (Left) 04/21/2018  . Opiate overdose ([place]) 03/24/2018  . Acute  encephalopathy 03/24/2018  . Encephalopathy acute 03/24/2018  . Arthropathy of knee (Left) 01/12/2018  . Opioid-induced constipation (OIC) 01/12/2018  . Tinea corporis 01/07/2018  . Tinea versicolor 01/07/2018  . Pharmacologic therapy 12/08/2017  . Problems influencing health status 12/08/2017  . Long term prescription opiate use 12/08/2017  . Opiate use 12/08/2017  . Insomnia 11/20/2017  . Disorder of skeletal system 11/05/2017  . Chronic knee pain (Primary Area of Pain) (Bilateral) (L>R) 11/05/2017  . Chronic wrist pain (Secondary Area of Pain) (Left) 11/05/2017  . Chronic constipation 08/18/2017  . Hyperlipidemia 05/12/2017  . COPD without exacerbation ([place]) 05/07/2017  . Male hypogonadism 04/24/2017  . Post-void dribbling 04/01/2017  . Osteoarthritis of the knee (Left)   . Skin lesions 03/12/2017  . GERD (gastroesophageal reflux disease) 03/12/2017  . Bucket handle tear of meniscus of knee (Right)   . Anxiety and depression 10/07/2015  . Morbid obesity ([place]) 10/06/2015  . OSA (obstructive sleep apnea) 08/31/2014  . Testosterone deficiency 07/01/2014  . S/P knee surgery 04/26/2014  . Meniscal tear of knee, sequela (Left) 04/26/2014  . Medial meniscus, posterior horn derangement 04/22/2014  . Intrinsic asthma 12/19/2013  . Chronic pain syndrome 05/13/2013  . Diabetes ([place]) 05/13/2013  . Mediastinal abnormality 04/10/2012  . Cigarette smoker 04/10/2012   Ihor Austin, LPTA; [place]  Aldona Lento 09/08/2018, 1:23 PM  [facility] [address] [place], [place], [address] Phone: [phone]   Fax:  [phone]  Name: STEWARD SAMES MRN: [id number] Date of Birth: [birth year]

## 2018-09-10 ENCOUNTER — Ambulatory Visit (HOSPITAL_COMMUNITY): Payer: Medicare Other

## 2018-09-10 ENCOUNTER — Encounter (HOSPITAL_COMMUNITY): Payer: Self-pay

## 2018-09-10 DIAGNOSIS — R600 Localized edema: Secondary | ICD-10-CM

## 2018-09-10 DIAGNOSIS — R262 Difficulty in walking, not elsewhere classified: Secondary | ICD-10-CM | POA: Diagnosis not present

## 2018-09-10 DIAGNOSIS — M25562 Pain in left knee: Secondary | ICD-10-CM | POA: Diagnosis not present

## 2018-09-10 DIAGNOSIS — M25662 Stiffness of left knee, not elsewhere classified: Secondary | ICD-10-CM

## 2018-09-10 DIAGNOSIS — M6281 Muscle weakness (generalized): Secondary | ICD-10-CM | POA: Diagnosis not present

## 2018-09-10 NOTE — Therapy (Addendum)
[facility] [address] [place], [place], [address] Phone: [phone]   Fax:  [phone]   PHYSICAL THERAPY DISCHARGE SUMMARY  Visits from Start of Care: 13  Current functional level related to goals / functional outcomes: See below   Remaining deficits: See below   Education / Equipment: HEP  Plan: Patient agrees to discharge.  Patient goals were met. Patient is being discharged due to meeting the stated rehab goals.  ?????     I have personally spoke with Ihor Austin, PTA, regarding this pt prior to and during his treatment and agree that he is ready for discharge at this time due to progress made.   Geraldine Solar PT, DPT   Physical Therapy Treatment  Patient Details  Name: Joseph [name] MRN: [id number] Date of Birth: 04-23-1971 Referring Provider (PT): Edmonia Lynch, MD   Encounter Date: 09/10/2018  PT End of Session - 09/10/18 1343    Visit Number  13    Number of Visits  23    Date for PT Re-Evaluation  09/17/18    Authorization Type  Medicare    Authorization Time Period  07/23/18 to 09/03/18; NEW: 09/03/18 to 09/17/18    Authorization - Visit Number  13    Authorization - Number of Visits  23    PT Start Time  1300    PT Stop Time  1340    PT Time Calculation (min)  40 min    Activity Tolerance  Patient tolerated treatment well;No increased pain    Behavior During Therapy  WFL for tasks assessed/performed       Past Medical History:  Diagnosis Date  . Anxiety   . Arthritis    oa left knee and lower back  . Asthma   . Cataract    hx of bilateral  . Chronic back pain   . Chronic pain of left knee   . COPD (chronic obstructive pulmonary disease) ([place])   . Diabetes mellitus    type 2  . GERD (gastroesophageal reflux disease)   . INGUINAL PAIN, LEFT 10/03/2009   Qualifier: Diagnosis of  By: Oneida Alar MD, Sandi L   . Overdose    03-24-18 accidental  . Shortness of breath    with heavy exertion  . Sleep  apnea     Past Surgical History:  Procedure Laterality Date  . CARPAL TUNNEL RELEASE Left   . CATARACT EXTRACTION W/PHACO Right 03/11/2016   Procedure: CATARACT EXTRACTION PHACO AND INTRAOCULAR LENS PLACEMENT (IOC);  Surgeon: Tonny Branch, MD;  [facility]: [facility];  Service: Ophthalmology;  Laterality: Right;  CDE 4.24  . EYE SURGERY Right 2018   ioc with lens replacement  . HERNIA REPAIR Bilateral 2778   umbilical  . KNEE ARTHROSCOPY WITH MEDIAL MENISECTOMY Left 04/22/2014   Procedure: LEFT KNEE ARTHROSCOPY WITH MEDIAL MENISECTOMY;  Surgeon: Carole Civil, MD;  Location: [facility];  Service: Orthopedics;  Laterality: Left;  . KNEE ARTHROSCOPY WITH MEDIAL MENISECTOMY Right 12/06/2015   Procedure: RIGHT KNEE ARTHROSCOPY WITH MEDIAL MENISECTOMY;  Surgeon: Carole Civil, MD;  [facility];  Service: Orthopedics;  Laterality: Right;  . KNEE ARTHROSCOPY WITH MEDIAL MENISECTOMY Left 03/20/2017   Procedure: KNEE ARTHROSCOPY WITH MEDIAL MENISECTOMY;  Surgeon: Carole Civil, MD;  Location: [facility];  Service: Orthopedics;  Laterality: Left;  . ORIF WRIST FRACTURE Left 12/19/2016   Procedure: OPEN REDUCTION INTERNAL FIXATION (ORIF) WRIST FRACTURE;  Surgeon: Leanora Cover, MD;  Location[facility];  Service: Orthopedics;  Laterality: Left;  accumed   . PARTIAL KNEE ARTHROPLASTY Left 07/21/2018   Procedure: LEFT UNICOMPARTMENTAL KNEE;  Surgeon: Renette Butters, MD;  Location[facility];  Service: Orthopedics;  Laterality: Left;  . SHOULDER ARTHROCENTESIS Right 2008 or 2010  . VASECTOMY  2014    There were no vitals filed for this visit.  Subjective Assessment - 09/10/18 1302    Subjective  Pt stated he has some soreness, pain scale 2/10.  Hasn't had any "charlie horse" since that one PT session.  Pt plans to go to beach next week, feels ready for discharge from PT.      How long can you sit comfortably?  no issues    How long can you stand comfortably?  1 hour easily (was 15 mins)     How long can you walk comfortably?  several hours completely with SPC or no AD (was 37f with RW)    Patient Stated Goals  get L knee fixed/back to normal    Currently in Pain?  Yes    Pain Score  2     Pain Location  Knee    Pain Orientation  Left    Pain Descriptors / Indicators  Sore    Pain Type  Surgical pain    Pain Onset  More than a month ago    Pain Frequency  Intermittent    Aggravating Factors   bending, WB, turning wrong way    Pain Relieving Factors  medication, ice pack    Effect of Pain on Daily Activities  no prolonged standing or walking         [facility] - 09[facility]18 0001      Assessment   Medical Diagnosis  L partial TKA    Referring Provider (PT)  TEdmonia Lynch MD    Onset Date/Surgical Date  07/21/18    Next MD Visit  not sure    Prior Therapy  yes for R shoulder (2008), none for present issue      Observation/Other Assessments   Focus on Therapeutic Outcomes (FOTO)   10.8% limited   was 50% limited     Palpation   Palpation comment  Lt HS symmetrical to Rt with palpation      Ambulation/Gait   Ambulation Distance (Feet)  824 Feet   3MWT no AD was 724 last assessed   Assistive device  None    Gait Pattern  Step-through pattern;Antalgic   Mild antalgic wiht increased time                  [facility] - 09/10/18 0001      Knee/Hip Exercises: Machines for Strengthening   Cybex Knee Extension  2.5Pl 2x 10    Cybex Knee Flexion  4.5Pl 2x 10    Total Gym Leg Press  bodycraft 5Pl 2x10      Knee/Hip Exercises: Standing   Heel Raises  Left;15 reps;3 seconds    Heel Raises Limitations  on slope    Functional Squat  15 reps    Functional Squat Limitations  front of chair    SLS with Vectors  standing Lt SLS 5x 5" each direction on foam    Other Standing Knee Exercises  sidestep GTB 2RT      Manual Therapy   Manual Therapy  Soft tissue mobilization    Manual therapy comments  completed seperately from  all other skilled interventions    Soft tissue  mobilization  Soft tissue mobilization to distal quad to reduce tightness              PT Education - 09/10/18 1439    Education Details  Discussed progression following DC with HEP and [employer] membership    Person(s) Educated  Patient    Methods  Explanation    Comprehension  Verbalized understanding       PT Short Term Goals - 09/10/18 1442      PT SHORT TERM GOAL #1   Title  Pt will have decreased L knee edema by 3cm or > in order to decrease pain and improve ROM.     Baseline  10/3: decreased by approximately 4cm, symmetrical with RLE    Status  Achieved      PT SHORT TERM GOAL #2   Title  Pt will have improved L knee AROM from 10-95deg in order to decrease pain and maximize gait.    Baseline  10/3; 0-130deg    Status  Achieved      PT SHORT TERM GOAL #3   Title  Pt will have 1/2 grade improvement throughout MMT in order to decrease pain and maximize gait.     Baseline  10/3: see MMT    Status  Achieved      PT SHORT TERM GOAL #4   Title  Pt will be able to perform 5xSTS in 15 sec or < without UE and with proper mechanics to demo improved balance and functional strength.    Baseline  10/3: 9.28sec, no UE, improved weight shift over LLE    Status  Achieved      PT SHORT TERM GOAL #5   Title  Pt will be independent with advanced HEP and verbalize understanding and regular participation in order to maximize strength and decrease pain.    Baseline  10/10: reoprts compliance with HEP    Status  Achieved      PT SHORT TERM GOAL #6   Title  Pt will report no bouts of L HS cramping/"charlie horse" and no bouts of L knee buckling over the last 7 days in order to decrease his risk for falls and demo improved overall function.   No reports of cramping or "Charlie horse" in a week   Status  Achieved        PT Long Term Goals - 09/10/18 1444      PT LONG TERM GOAL #1   Title  Pt will have improved L knee AROM from 0-115deg in  order to further decrease pain and maximize stair ambulation.    Status  Achieved      PT LONG TERM GOAL #2   Title  Pt will have 1 grade improvement throughout MMT in order to further decrease pain and maximize overall functional mobility.    Baseline  10/3: see MMT    Status  Partially Met      PT LONG TERM GOAL #3   Title  Pt will be able to ambulate 512f or > during 3MWT with LRAD and gait WFL in order to demo improved functional mobility and maximize community access.    Baseline  10/10: 8246fno AD, mild antalgic git; 10/3: 72421fno AD, mild antalgic gait; eval: 188f107fth RW    Status  Achieved      PT LONG TERM GOAL #4   Title  Pt will be able to perform L SLS for 15 sec without UE support in order to demo improved  balance and maximize his gait on uneven ground and stair ambulation.    Baseline  10/3: 22.3sec    Status  Achieved            Plan - 09/10/18 1322    Clinical Impression Statement  Pt stated he has plans to go to beach next week, discussed continuing/DC therapy.  Pt stated he feels confident to continue HEP and interested in joining [employer] to continue functional strengthening as well as swimming.  FOTO complete with vast improved self-perceived functional abilities with 10.8% limitations (was 50% reassessment).  Reviewed machines to begin at gym and HEP to continue at home, able to verbalize and/or demonstrate appropriate mechanics.  EOS with manual to address soft tissue restrictions, Lt hamstrings are symmetrical to Rt.  Improved distanced with 3MWT with minimal antalgic near end due to fatigue.      Rehab Potential  Good    PT Frequency  2x / week    PT Duration  2 weeks    PT Treatment/Interventions  ADLs/Self Care Home Management;Aquatic Therapy;Cryotherapy;Electrical Stimulation;Moist Heat;Traction;Biofeedback;Ultrasound;DME Instruction;Gait training;Stair training;Functional mobility training;Therapeutic activities;Therapeutic exercise;Balance  training;Neuromuscular re-education;Patient/family education;Scar mobilization;Passive range of motion;Compression bandaging;Dry needling;Energy conservation;Taping    PT Next Visit Plan  DC to HEP     PT Home Exercise Plan  eval: quad sets, heel slides; 08/19/18:  TKE and calf stretches; 10/3: Supine hamstring stretch (hand behind thigh), sidelying hip abd, supine bridge       Patient will benefit from skilled therapeutic intervention in order to improve the following deficits and impairments:  Abnormal gait, Decreased activity tolerance, Decreased balance, Decreased mobility, Decreased range of motion, Decreased scar mobility, Decreased strength, Difficulty walking, Hypomobility, Increased edema, Increased fascial restricitons, Increased muscle spasms, Impaired flexibility, Improper body mechanics, Pain  Visit Diagnosis: Stiffness of left knee, not elsewhere classified  Localized edema  Muscle weakness (generalized)  Difficulty in walking, not elsewhere classified  Acute pain of left knee     Problem List Patient Active Problem List   Diagnosis Date Noted  . Primary osteoarthritis of knee 07/21/2018  . Status post unicompartmental knee replacement, left 07/21/2018  . Medicare annual wellness visit, initial 05/20/2018  . Effusion of knee joint (Left) 04/21/2018  . Opiate overdose ([place]) 03/24/2018  . Acute encephalopathy 03/24/2018  . Encephalopathy acute 03/24/2018  . Arthropathy of knee (Left) 01/12/2018  . Opioid-induced constipation (OIC) 01/12/2018  . Tinea corporis 01/07/2018  . Tinea versicolor 01/07/2018  . Pharmacologic therapy 12/08/2017  . Problems influencing health status 12/08/2017  . Long term prescription opiate use 12/08/2017  . Opiate use 12/08/2017  . Insomnia 11/20/2017  . Disorder of skeletal system 11/05/2017  . Chronic knee pain (Primary Area of Pain) (Bilateral) (L>R) 11/05/2017  . Chronic wrist pain (Secondary Area of Pain) (Left) 11/05/2017  .  Chronic constipation 08/18/2017  . Hyperlipidemia 05/12/2017  . COPD without exacerbation ([place]) 05/07/2017  . Male hypogonadism 04/24/2017  . Post-void dribbling 04/01/2017  . Osteoarthritis of the knee (Left)   . Skin lesions 03/12/2017  . GERD (gastroesophageal reflux disease) 03/12/2017  . Bucket handle tear of meniscus of knee (Right)   . Anxiety and depression 10/07/2015  . Morbid obesity ([place]) 10/06/2015  . OSA (obstructive sleep apnea) 08/31/2014  . Testosterone deficiency 07/01/2014  . S/P knee surgery 04/26/2014  . Meniscal tear of knee, sequela (Left) 04/26/2014  . Medial meniscus, posterior horn derangement 04/22/2014  . Intrinsic asthma 12/19/2013  . Chronic pain syndrome 05/13/2013  . Diabetes ([place]) 05/13/2013  .  Mediastinal abnormality 04/10/2012  . Cigarette smoker 04/10/2012   Ihor Austin, [place]; [place]  Aldona Lento 09/10/2018, 2:47 PM  [facility] [address] [place], [place], [address] Phone: [phone]   Fax:  [phone]  Name: EMELIO SCHNELLER MRN: [id number] Date of Birth: Nov 05, 1971

## 2018-09-14 ENCOUNTER — Ambulatory Visit: Payer: Medicare Other | Attending: Nurse Practitioner | Admitting: Nurse Practitioner

## 2018-09-14 ENCOUNTER — Encounter: Payer: Self-pay | Admitting: Nurse Practitioner

## 2018-09-14 ENCOUNTER — Other Ambulatory Visit: Payer: Self-pay

## 2018-09-14 VITALS — BP 127/84 | HR 90 | Temp 98.1°F | Resp 18 | Ht 70.0 in | Wt 230.0 lb

## 2018-09-14 DIAGNOSIS — G894 Chronic pain syndrome: Secondary | ICD-10-CM | POA: Insufficient documentation

## 2018-09-14 DIAGNOSIS — M170 Bilateral primary osteoarthritis of knee: Secondary | ICD-10-CM | POA: Insufficient documentation

## 2018-09-14 DIAGNOSIS — Z9114 Patient's other noncompliance with medication regimen: Secondary | ICD-10-CM | POA: Insufficient documentation

## 2018-09-14 DIAGNOSIS — K219 Gastro-esophageal reflux disease without esophagitis: Secondary | ICD-10-CM | POA: Diagnosis not present

## 2018-09-14 DIAGNOSIS — Z881 Allergy status to other antibiotic agents status: Secondary | ICD-10-CM | POA: Insufficient documentation

## 2018-09-14 DIAGNOSIS — Z79891 Long term (current) use of opiate analgesic: Secondary | ICD-10-CM | POA: Insufficient documentation

## 2018-09-14 DIAGNOSIS — Z5181 Encounter for therapeutic drug level monitoring: Secondary | ICD-10-CM | POA: Insufficient documentation

## 2018-09-14 DIAGNOSIS — M25562 Pain in left knee: Secondary | ICD-10-CM | POA: Diagnosis not present

## 2018-09-14 DIAGNOSIS — Z7982 Long term (current) use of aspirin: Secondary | ICD-10-CM | POA: Insufficient documentation

## 2018-09-14 DIAGNOSIS — Z7984 Long term (current) use of oral hypoglycemic drugs: Secondary | ICD-10-CM | POA: Diagnosis not present

## 2018-09-14 DIAGNOSIS — F419 Anxiety disorder, unspecified: Secondary | ICD-10-CM | POA: Insufficient documentation

## 2018-09-14 DIAGNOSIS — F17210 Nicotine dependence, cigarettes, uncomplicated: Secondary | ICD-10-CM | POA: Diagnosis not present

## 2018-09-14 DIAGNOSIS — E785 Hyperlipidemia, unspecified: Secondary | ICD-10-CM | POA: Diagnosis not present

## 2018-09-14 DIAGNOSIS — G8929 Other chronic pain: Secondary | ICD-10-CM

## 2018-09-14 DIAGNOSIS — Z79899 Other long term (current) drug therapy: Secondary | ICD-10-CM | POA: Insufficient documentation

## 2018-09-14 DIAGNOSIS — M25561 Pain in right knee: Secondary | ICD-10-CM | POA: Diagnosis not present

## 2018-09-14 DIAGNOSIS — Z6833 Body mass index (BMI) 33.0-33.9, adult: Secondary | ICD-10-CM | POA: Diagnosis not present

## 2018-09-14 DIAGNOSIS — F329 Major depressive disorder, single episode, unspecified: Secondary | ICD-10-CM | POA: Diagnosis not present

## 2018-09-14 DIAGNOSIS — Z91148 Patient's other noncompliance with medication regimen for other reason: Secondary | ICD-10-CM | POA: Insufficient documentation

## 2018-09-14 DIAGNOSIS — J449 Chronic obstructive pulmonary disease, unspecified: Secondary | ICD-10-CM | POA: Diagnosis not present

## 2018-09-14 DIAGNOSIS — G4733 Obstructive sleep apnea (adult) (pediatric): Secondary | ICD-10-CM | POA: Insufficient documentation

## 2018-09-14 DIAGNOSIS — E119 Type 2 diabetes mellitus without complications: Secondary | ICD-10-CM | POA: Diagnosis not present

## 2018-09-14 NOTE — Patient Instructions (Signed)
____________________________________________________________________________________________  Medication Rules  Applies to: All patients receiving prescriptions (written or electronic).  Pharmacy of record: Pharmacy where electronic prescriptions will be sent. If written prescriptions are taken to a different pharmacy, please inform the nursing staff. The pharmacy listed in the electronic medical record should be the one where you would like electronic prescriptions to be sent.  Prescription refills: Only during scheduled appointments. Applies to both, written and electronic prescriptions.  NOTE: The following applies primarily to controlled substances (Opioid* Pain Medications).   Patient's responsibilities: 1. Pain Pills: Bring all pain pills to every appointment (except for procedure appointments). 2. Pill Bottles: Bring pills in original pharmacy bottle. Always bring newest bottle. Bring bottle, even if empty. 3. Medication refills: You are responsible for knowing and keeping track of what medications you need refilled. The day before your appointment, write a list of all prescriptions that need to be refilled. Bring that list to your appointment and give it to the admitting nurse. Prescriptions will be written only during appointments. If you forget a medication, it will not be "Called in", "Faxed", or "electronically sent". You will need to get another appointment to get these prescribed. 4. Prescription Accuracy: You are responsible for carefully inspecting your prescriptions before leaving our office. Have the discharge nurse carefully go over each prescription with you, before taking them home. Make sure that your name is accurately spelled, that your address is correct. Check the name and dose of your medication to make sure it is accurate. Check the number of pills, and the written instructions to make sure they are clear and accurate. Make sure that you are given enough medication to last  until your next medication refill appointment. 5. Taking Medication: Take medication as prescribed. Never take more pills than instructed. Never take medication more frequently than prescribed. Taking less pills or less frequently is permitted and encouraged, when it comes to controlled substances (written prescriptions).  6. Inform other Doctors: Always inform, all of your healthcare providers, of all the medications you take. 7. Pain Medication from other Providers: You are not allowed to accept any additional pain medication from any other Doctor or Healthcare provider. There are two exceptions to this rule. (see below) In the event that you require additional pain medication, you are responsible for notifying us, as stated below. 8. Medication Agreement: You are responsible for carefully reading and following our Medication Agreement. This must be signed before receiving any prescriptions from our practice. Safely store a copy of your signed Agreement. Violations to the Agreement will result in no further prescriptions. (Additional copies of our Medication Agreement are available upon request.) 9. Laws, Rules, & Regulations: All patients are expected to follow all Federal and State Laws, Statutes, Rules, & Regulations. Ignorance of the Laws does not constitute a valid excuse. The use of any illegal substances is prohibited. 10. Adopted CDC guidelines & recommendations: Target dosing levels will be at or below 60 MME/day. Use of benzodiazepines** is not recommended.  Exceptions: There are only two exceptions to the rule of not receiving pain medications from other Healthcare Providers. 1. Exception #1 (Emergencies): In the event of an emergency (i.e.: accident requiring emergency care), you are allowed to receive additional pain medication. However, you are responsible for: As soon as you are able, call our office [phone], at any time of the day or night, and leave a message stating your name, the  date and nature of the emergency, and the name and dose of the medication   prescribed. In the event that your call is answered by a member of our staff, make sure to document and save the date, time, and the name of the person that took your information.  2. Exception #2 (Planned Surgery): In the event that you are scheduled by another doctor or dentist to have any type of surgery or procedure, you are allowed (for a period no longer than 30 days), to receive additional pain medication, for the acute post-op pain. However, in this case, you are responsible for picking up a copy of our "Post-op Pain Management for Surgeons" handout, and giving it to your surgeon or dentist. This document is available at our office, and does not require an appointment to obtain it. Simply go to our office during business hours (Monday-Thursday from 8:00 AM to 4:00 PM) (Friday 8:00 AM to 12:00 Noon) or if you have a scheduled appointment with [place], prior to your surgery, and ask for it by name. In addition, you will need to provide [place] with your name, name of your surgeon, type of surgery, and date of procedure or surgery.  *Opioid medications include: morphine, codeine, oxycodone, oxymorphone, hydrocodone, hydromorphone, meperidine, tramadol, tapentadol, buprenorphine, fentanyl, methadone. **Benzodiazepine medications include: diazepam (Valium), alprazolam (Xanax), clonazepam (Klonopine), lorazepam (Ativan), clorazepate (Tranxene), chlordiazepoxide (Librium), estazolam (Prosom), oxazepam (Serax), temazepam (Restoril), triazolam (Halcion) (Last updated: 01/29/2018) ____________________________________________________________________________________________    BMI Assessment: Estimated body mass index is 33 kg/m as calculated from the following:   Height as of this encounter: 5\' 10"  (1.778 m).   Weight as of this encounter: 230 lb (104.3 kg).  BMI interpretation table: BMI level Category Range association with higher  incidence of chronic pain  <18 kg/m2 Underweight   18.5-24.9 kg/m2 Ideal body weight   25-29.9 kg/m2 Overweight Increased incidence by 20%  30-34.9 kg/m2 Obese (Class I) Increased incidence by 68%  35-39.9 kg/m2 Severe obesity (Class II) Increased incidence by 136%  >40 kg/m2 Extreme obesity (Class III) Increased incidence by 254%   Patient's current BMI Ideal Body weight  Body mass index is 33 kg/m. Ideal body weight: 73 kg (160 lb 15 oz) Adjusted ideal body weight: 85.5 kg (188 lb 9 oz)   BMI Readings from Last 4 Encounters:  09/14/18 33.00 kg/m  08/13/18 34.58 kg/m  07/21/18 34.44 kg/m  07/13/18 33.15 kg/m   Wt Readings from Last 4 Encounters:  09/14/18 230 lb (104.3 kg)  08/13/18 241 lb (109.3 kg)  07/21/18 240 lb (108.9 kg)  07/13/18 231 lb (104.8 kg)

## 2018-09-14 NOTE — Progress Notes (Signed)
Nursing Pain Medication Assessment:  Safety precautions to be maintained throughout the outpatient stay will include: orient to surroundings, keep bed in low position, maintain call bell within reach at all times, provide assistance with transfer out of bed and ambulation.  Medication Inspection Compliance: Mr. Fandrich did not comply with our request to bring his pills to be counted. He was reminded that bringing the medication bottles, even when empty, is a requirement.  Medication: None brought in. Pill/Patch Count: None available to be counted. Bottle Appearance: No container available. Did not bring bottle(s) to appointment. Filled Date: N/A Last Medication intake:  Ran out of medicine more than 48 hours ago

## 2018-09-14 NOTE — Progress Notes (Signed)
Patient's Name: Joseph [name]  MRN: [id number]  Referring Provider: Pleas Koch, NP  DOB: 20-Dec-1970  PCP: Pleas Koch, NP  DOS: 09/14/2018  Note by: Vevelyn Francois NP  Service setting: Ambulatory outpatient  Specialty: Interventional Pain Management  Location: [facility] (AMB) Pain Management Facility    Patient type: Established    Primary Reason(s) for Visit: Encounter for prescription drug management. (Level of risk: moderate)  CC: Knee Pain (left)  HPI  [name] is a 47 y.o. year old, male patient, who comes today for a medication management evaluation. He has Mediastinal abnormality; Cigarette smoker; Chronic pain syndrome; Diabetes ([place]); Intrinsic asthma; Medial meniscus, posterior horn derangement; S/P knee surgery; Meniscal tear of knee, sequela (Left); Testosterone deficiency; OSA (obstructive sleep apnea); Morbid obesity ([place]); Anxiety and depression; Bucket handle tear of meniscus of knee (Right); Skin lesions; GERD (gastroesophageal reflux disease); Osteoarthritis of the knee (Left); Post-void dribbling; COPD without exacerbation ([place]); Hyperlipidemia; Chronic constipation; Disorder of skeletal system; Chronic knee pain (Primary Area of Pain) (Bilateral) (L>R); Chronic wrist pain (Secondary Area of Pain) (Left); Insomnia; Male hypogonadism; Pharmacologic therapy; Problems influencing health status; Long term prescription opiate use; Opiate use; Tinea corporis; Tinea versicolor; Arthropathy of knee (Left); Opioid-induced constipation (OIC); Opiate overdose ([place]); Acute encephalopathy; Encephalopathy acute; Effusion of knee joint (Left); Medicare annual wellness visit, initial; Primary osteoarthritis of knee; and Status post unicompartmental knee replacement, left on their problem list. His primarily concern today is the Knee Pain (left)  Pain Assessment: Location: Left Knee Radiating: denies Onset: More than a month ago Duration: Chronic pain Quality: Sore(some  swelling) Severity: 3 /10 (subjective, self-reported pain score)  Note: Reported level is compatible with observation.                          Effect on ADL:   Timing: Intermittent Modifying factors: medications, PT BP: 127/84  HR: 90  Joseph [name] was last scheduled for an appointment on 08/13/2018 for medication management. During today's appointment we reviewed Joseph [name] chronic pain status, as well as his outpatient medication regimen.  The patient  reports that he does not use drugs. His body mass index is 33 kg/m.  Further details on both, my assessment(s), as well as the proposed treatment plan, please see below.  Controlled Substance Pharmacotherapy Assessment REMS (Risk Evaluation and Mitigation Strategy)  Analgesic:Nucynta 50 mg 3 times daily(150 mg/dayof Nucynta) MME/day:1m/day SHart Rochester RN  09/14/2018 11:15 AM  Sign at close encounter Nursing Pain Medication Assessment:  Safety precautions to be maintained throughout the outpatient stay will include: orient to surroundings, keep bed in low position, maintain call bell within reach at all times, provide assistance with transfer out of bed and ambulation.  Medication Inspection Compliance: Mr. GKornegaydid not comply with our request to bring his pills to be counted. He was reminded that bringing the medication bottles, even when empty, is a requirement.  Medication: None brought in. Pill/Patch Count: None available to be counted. Bottle Appearance: No container available. Did not bring bottle(s) to appointment. Filled Date: N/A Last Medication intake:  Ran out of medicine more than 48 hours ago      Pharmacokinetics: Liberation and absorption (onset of action): WNL Distribution (time to peak effect): WNL Metabolism and excretion (duration of action): WNL         Pharmacodynamics: Desired effects: Analgesia: [name] >>36%benefit. Functional ability: Patient reports that medication allows him  to accomplish basic  ADLs Clinically meaningful improvement in function (CMIF): Sustained CMIF goals met Perceived effectiveness: Described as relatively effective, allowing for increase in activities of daily living (ADL) Undesirable effects: Side-effects or Adverse reactions: None reported Monitoring: [place] PMP: Online review of the past 73-monthperiod conducted. Compliant with practice rules and regulations Last UDS on record: Summary  Date Value Ref Range Status  08/13/2018 FINAL  Final    Comment:    ==================================================================== TOXASSURE SELECT 13 (MW) ==================================================================== Test                             Result       Flag       Units Drug Present and Declared for Prescription Verification   Oxymorphone                    60           EXPECTED   ng/mg creat   Noroxycodone                   280          EXPECTED   ng/mg creat    Oxymorphone and noroxycodone are expected metabolites of    oxycodone. Sources of oxycodone are scheduled prescription    medications. Oxymorphone is also available as a scheduled    prescription medication.   Tapentadol                     10611        EXPECTED   ng/mg creat    Source of tapentadol is a scheduled prescription medication. Drug Present not Declared for Prescription Verification   Alcohol, Ethyl                 >0.400       UNEXPECTED g/dL    Sources of ethyl alcohol include alcoholic beverages or as a    fermentation product of glucose; glucose is present in this    specimen.  The high concentration of ethyl alcohol and the    presence of glucose supports fermentation as the source of ethyl    alcohol in this specimen. Drug Absent but Declared for Prescription Verification   Oxycodone                      Not Detected UNEXPECTED ng/mg creat    Oxycodone is almost always present in patients taking this drug    consistently.  Absence of oxycodone could be  due to lapse of time    since the last dose or unusual pharmacokinetics (rapid    metabolism). ==================================================================== Test                      Result    Flag   Units      Ref Range   Creatinine              85               mg/dL      >=20 ==================================================================== Declared Medications:  The flagging and interpretation on this report are based on the  following declared medications.  Unexpected results may arise from  inaccuracies in the declared medications.  **Note: The testing scope of this panel includes these medications:  Oxycodone  Tapentadol  **Note: The testing scope of this panel does not include following  reported medications:  Albuterol (Combivent)  Albuterol (Proventil)  Aspirin (Aspirin 81)  Atorvastatin (Lipitor)  Betamethasone (Diprolene)  Bupropion (Wellbutrin)  Citalopram (Celexa)  Diphenhydramine (Benadryl)  Docusate (Colace)  Empagliflozin (Jardiance)  Fluticasone (Breo)  Gabapentin (Neurontin)  Glipizide (Glucotrol)  Ipratropium (Atrovent)  Ipratropium (Combivent)  Lansoprazole (Prevacid)  Metformin (Glucophage)  Methocarbamol (Robaxin)  Multivitamin (MVI)  Ondansetron (Zofran)  Supplement (Saw Palmetto)  Tamsulosin  Testosterone  Trazodone  Turmeric  Vilanterol (Breo) ==================================================================== For clinical consultation, please call [phone]. ====================================================================    UDS interpretation: Non-Compliant Absence of the parent compound in the presence of its metabolites could be due to lapse of time since the last dose or unusual pharmacokinetics (Rapid Metabolism). PT has Diabetes which may explain the ETOH Medication Assessment Form: Reviewed. Patient indicates being compliant with therapy Treatment compliance: Non-compliant. Steps taken to remind the patient of the  seriousness of adequate therapy compliance Risk Assessment Profile: Aberrant behavior: failure to bring medications for pill counts, lost, stolen or damaged medications, non-compliance with medical instructions on the proper use of the medication, non-compliance with practice rules and regulations and unsafe use of medication Comorbid factors increasing risk of overdose: history of attempted suicide  Opioid risk tool (ORT) (Total Score): 4 Personal History of Substance Abuse (SUD-Substance use disorder):  Alcohol: Negative  Illegal Drugs: Negative  Rx Drugs: Negative  ORT Risk Level calculation: Moderate Risk Risk of substance use disorder (SUD): Moderate-to-High Opioid Risk Tool - 09/14/18 1114      Family History of Substance Abuse   Alcohol  Positive Male    Illegal Drugs  Negative    Rx Drugs  Negative      Personal History of Substance Abuse   Alcohol  Negative    Illegal Drugs  Negative    Rx Drugs  Negative      Age   Age between 9-45 years   No      History of Preadolescent Sexual Abuse   History of Preadolescent Sexual Abuse  Negative or Male      Psychological Disease   Psychological Disease  Negative    Depression  Positive      Total Score   Opioid Risk Tool Scoring  4    Opioid Risk Interpretation  Moderate Risk      ORT Scoring interpretation table:  Score <3 = Low Risk for SUD  Score between 4-7 = Moderate Risk for SUD  Score >8 = High Risk for Opioid Abuse   Risk Mitigation Strategies:  Patient Counseling: Mr. Bonanno was informed that he is currently not a candidate for opioid analgesic therapy. Patient-Prescriber Agreement (PPA): Broken  Notification to other healthcare providers: N/A. Opioid therapy discontinued  Pharmacologic Plan: For safety reasons, the treatment plan will be modified to exclude opioids.             Laboratory Chemistry  Inflammation Markers (CRP: Acute Phase) (ESR: Chronic Phase) Lab Results  Component Value Date   CRP 0.7  11/05/2017   ESRSEDRATE 11 11/05/2017   LATICACIDVEN 2.1 01/24/2013                         Rheumatology Markers No results found for: RF, ANA, LABURIC, URICUR, LYMEIGGIGMAB, LYMEABIGMQN, HLAB27                      Renal Function Markers Lab Results  Component Value Date   BUN 8 07/13/2018   CREATININE 0.74 07/13/2018   BCR 10 11/05/2017  GFRAA >60 07/13/2018   GFRNONAA >60 07/13/2018                             Hepatic Function Markers Lab Results  Component Value Date   AST 11 (L) 03/25/2018   ALT 13 (L) 03/25/2018   ALBUMIN 3.6 03/25/2018   ALKPHOS 67 03/25/2018   LIPASE 38 01/24/2013                        Electrolytes Lab Results  Component Value Date   NA 140 07/13/2018   K 3.8 07/13/2018   CL 103 07/13/2018   CALCIUM 8.9 07/13/2018   MG 2.1 11/05/2017                        Neuropathy Markers Lab Results  Component Value Date   VITAMINB12 341 11/05/2017   HGBA1C 7.1 (H) 07/13/2018   HIV Non Reactive 03/24/2018                        CNS Tests No results found for: COLORCSF, APPEARCSF, RBCCOUNTCSF, WBCCSF, POLYSCSF, LYMPHSCSF, EOSCSF, PROTEINCSF, GLUCCSF, JCVIRUS, CSFOLI, IGGCSF                      Bone Pathology Markers Lab Results  Component Value Date   25OHVITD1 42 11/05/2017   25OHVITD2 <1.0 11/05/2017   25OHVITD3 42 11/05/2017   TESTOSTERONE 208.98 (L) 04/17/2017                         Coagulation Parameters Lab Results  Component Value Date   PLT 202 07/13/2018   DDIMER 0.46 03/07/2012                        Cardiovascular Markers Lab Results  Component Value Date   TROPONINI <0.30 12/09/2013   HGB 15.0 07/13/2018   HCT 43.8 07/13/2018                         CA Markers No results found for: CEA, CA125, LABCA2                      Note: Lab results reviewed.  Recent Diagnostic Imaging Results  DG Knee Left Port CLINICAL DATA:  Status post left partial knee replacement.  EXAM: PORTABLE LEFT KNEE - 1-2  VIEW  COMPARISON:  Preoperative MRI of the left knee.  FINDINGS: The patient has undergone replacement of the components of the medial compartment of the left knee. Radiographic positioning of the prosthetic components is good. The interface with the native bone appears normal. There is a moderate amount of fluid and air in the anterior aspect of the joint space.  IMPRESSION: No immediate complication following partial left knee joint replacement.  Electronically Signed   By: David  Martinique M.D.   On: 07/21/2018 10:00  Complexity Note: Imaging results reviewed. Results shared with Mr. Gloor, using Layman's terms.                         Meds   Current Outpatient Medications:  .  albuterol (PROVENTIL) (2.5 MG/3ML) 0.083% nebulizer solution, USE 1 VIAL VIA NEBULIZER FOUR TIMES DAILY AS NEEDED FOR SHORTNESS OF BREATH, Disp: 360 mL, Rfl: 0 .  ANDROGEL PUMP 20.25 MG/ACT (1.62%) GEL, Apply 2 Pump topically daily. , Disp: , Rfl:  .  aspirin EC 81 MG tablet, Take 1 tablet (81 mg total) by mouth 2 (two) times daily. For DVT prophylaxis, Disp: 60 tablet, Rfl: 0 .  atorvastatin (LIPITOR) 20 MG tablet, TAKE ONE (1) TABLET EACH DAY EVERY EVENING, Disp: 90 tablet, Rfl: 1 .  betamethasone dipropionate (DIPROLENE) 0.05 % cream, Apply topically 2 (two) times daily as needed. , Disp: , Rfl:  .  buPROPion (WELLBUTRIN SR) 150 MG 12 hr tablet, TAKE ONE TABLET BY MOUTH TWICE A DAY, Disp: 180 tablet, Rfl: 1 .  citalopram (CELEXA) 40 MG tablet, TAKE ONE TABLET BY MOUTH ONCE DAILY, Disp: 30 tablet, Rfl: 11 .  diphenhydrAMINE (BENADRYL) 25 MG tablet, Take 25 mg by mouth 2 (two) times daily., Disp: , Rfl:  .  docusate sodium (COLACE) 100 MG capsule, Take 1 capsule (100 mg total) by mouth 2 (two) times daily. To prevent constipation while taking pain medication., Disp: 60 capsule, Rfl: 0 .  fluticasone furoate-vilanterol (BREO ELLIPTA) 200-25 MCG/INH AEPB, Inhale 1 puff into the lungs daily., Disp: 28 each,  Rfl: 0 .  gabapentin (NEURONTIN) 300 MG capsule, Take 1 capsule (300 mg total) by mouth 3 (three) times daily for 14 days. For 2 weeks post op for pain., Disp: 42 capsule, Rfl: 0 .  glipiZIDE (GLUCOTROL) 5 MG tablet, TAKE TWO (2) TABLETS BY MOUTH 2 TIMES DAILY, Disp: 360 tablet, Rfl: 1 .  ipratropium (ATROVENT) 0.02 % nebulizer solution, USE 1 VIAL VIA NEBULIZER FOUR TIMES DAILY AS NEEDED, Disp: 300 mL, Rfl: 0 .  Ipratropium-Albuterol (COMBIVENT) 20-100 MCG/ACT AERS respimat, Inhale 1 puff into the lungs every 6 (six) hours as needed for wheezing., Disp: 1 Inhaler, Rfl: 2 .  JARDIANCE 10 MG TABS tablet, TAKE ONE TABLET BY MOUTH EVERY MORNING., Disp: 90 tablet, Rfl: 1 .  lansoprazole (PREVACID) 30 MG capsule, TAKE 1 CAPSULE BY MOUTH TWICE A DAY BEFORE A MEAL, Disp: 180 capsule, Rfl: 1 .  methocarbamol (ROBAXIN) 500 MG tablet, Take 1 tablet (500 mg total) by mouth every 8 (eight) hours as needed for muscle spasms., Disp: 40 tablet, Rfl: 0 .  Multiple Vitamin (MULTIVITAMIN WITH MINERALS) TABS, Take 1 tablet by mouth every morning. Men's once daily multivitamin packet (vitamin e, calcium, ginseng), Disp: , Rfl:  .  ondansetron (ZOFRAN) 4 MG tablet, Take 1 tablet (4 mg total) by mouth every 8 (eight) hours as needed for nausea or vomiting., Disp: 20 tablet, Rfl: 0 .  Saw Palmetto, Serenoa repens, (SAW PALMETTO PO), Take 1 capsule by mouth daily., Disp: , Rfl:  .  tamsulosin (FLOMAX) 0.4 MG CAPS capsule, TAKE ONE CAPSULE BY MOUTH DAILY, Disp: 90 capsule, Rfl: 1 .  tapentadol (NUCYNTA) 50 MG tablet, Take 1 tablet (50 mg total) by mouth every 8 (eight) hours as needed for severe pain., Disp: 90 tablet, Rfl: 0 .  traZODone (DESYREL) 150 MG tablet, TAKE ONE TABLET (150MG TOTAL) BY MOUTH AT BEDTIME AS NEEDED FOR SLEEP, Disp: 90 tablet, Rfl: 1 .  Turmeric 500 MG CAPS, Take 1 capsule by mouth daily., Disp: , Rfl:   ROS  Constitutional: Denies any fever or chills Gastrointestinal: No reported hemesis,  hematochezia, vomiting, or acute GI distress Musculoskeletal: Denies any acute onset joint swelling, redness, loss of ROM, or weakness Neurological: No reported episodes of acute onset apraxia, aphasia, dysarthria, agnosia, amnesia, paralysis, loss of coordination, or loss of consciousness  Allergies  Mr. Ardoin  is allergic to azithromycin; clarithromycin; erythromycin; keflex [cephalexin]; metformin; and symbicort [budesonide-formoterol fumarate].  [place]  Drug: Mr. Pen  reports that he does not use drugs. Alcohol:  reports that he does not drink alcohol. Tobacco:  reports that he has been smoking cigarettes. He started smoking about 24 years ago. He has a 26.00 pack-year smoking history. He has never used smokeless tobacco. Medical:  has a past medical history of Anxiety, Arthritis, Asthma, Cataract, Chronic back pain, Chronic pain of left knee, COPD (chronic obstructive pulmonary disease) ([place]), Diabetes mellitus, GERD (gastroesophageal reflux disease), INGUINAL PAIN, LEFT (10/03/2009), Overdose, Shortness of breath, and Sleep apnea. Surgical: Mr. Biven  has a past surgical history that includes Carpal tunnel release (Left); Shoulder Arthrocentesis (Right, 2008 or 2010); Vasectomy (2014); Knee arthroscopy with medial menisectomy (Left, 04/22/2014); Knee arthroscopy with medial menisectomy (Right, 12/06/2015); Cataract extraction w/PHACO (Right, 03/11/2016); ORIF wrist fracture (Left, 12/19/2016); Knee arthroscopy with medial menisectomy (Left, 03/20/2017); Eye surgery (Right, 2018); Hernia repair (Bilateral, 2010); and Partial knee arthroplasty (Left, 07/21/2018). Family: family history includes Clotting disorder in his maternal grandmother; Emphysema in his maternal grandfather and maternal grandmother.  Constitutional Exam  General appearance: Well nourished, well developed, and well hydrated. In no apparent acute distress Vitals:   09/14/18 1107  BP: 127/84  Pulse: 90  Resp: 18  Temp: 98.1 F  (36.7 C)  TempSrc: Oral  SpO2: 100%  Weight: 230 lb (104.3 kg)  Height: _0  (1.778 m)  Psych/Mental status: Alert, oriented x 3 (person, place, & time)       Eyes: PERLA Respiratory: No evidence of acute respiratory distress  Gait & Posture Assessment  Ambulation: Unassisted Gait: Relatively normal for age and body habitus Posture: WNL   Lower Extremity Exam    Side: Right lower extremity  Side: Left lower extremity  Stability: No instability observed          Stability: No instability observed          Skin & Extremity Inspection: Skin color, temperature, and hair growth are WNL. No peripheral edema or cyanosis. No masses, redness, swelling, asymmetry, or associated skin lesions. No contractures.  Skin & Extremity Inspection: Evidence of prior arthroplastic surgery  Functional ROM: Unrestricted ROM                  Functional ROM: Unrestricted ROM                  Muscle Tone/Strength: Functionally intact. No obvious neuro-muscular anomalies detected.  Muscle Tone/Strength: Functionally intact. No obvious neuro-muscular anomalies detected.  Sensory (Neurological): Unimpaired  Sensory (Neurological): Unimpaired  Palpation: No palpable anomalies  Palpation: No palpable anomalies   Assessment  Primary Diagnosis & Pertinent Problem List: The primary encounter diagnosis was Chronic knee pain (Primary Area of Pain) (Bilateral) (L>R). Diagnoses of Chronic pain syndrome and Long term prescription opiate use were also pertinent to this visit.  Status Diagnosis  Controlled Controlled Controlled 1. Chronic knee pain (Primary Area of Pain) (Bilateral) (L>R)   2. Chronic pain syndrome   3. Long term prescription opiate use     Problems updated and reviewed during this visit: No problems updated. Plan of Care  Pharmacotherapy (Medications Ordered): No orders of the defined types were placed in this encounter.  New Prescriptions   No medications on file   Medications administered  today: Arn Medal "[place]" had no medications administered during this visit. Lab-work, procedure(s), and/or referral(s): No orders of the defined types were placed in  this encounter.  Imaging and/or referral(s): None  Interventional therapies: Planned, scheduled, and/or pending: Not at this time   Considering: Diagnostic Left intra-articular knee injection Diagnostic Left genicular nerve block Possible Left genicular RFA Diagnostic Left Hyalgan series   Palliative PRN treatment(s): Not at this time.    Provider-requested follow-up: Return if symptoms worsen or fail to improve, for PRN Procedure.  Future Appointments  Date Time Provider [place]  11/10/2018 11:45 AM Tanda Rockers, MD [facility] None  05/21/2019  9:00 AM [facility]  05/28/2019 11:00 AM Eustace Pen, LPN [facility]  3/61/4431 11:20 AM Pleas Koch, NP [facility]   Primary Care Physician: Pleas Koch, NP Location: [facility] Note by: Vevelyn Francois NP Date: 09/14/2018; Time: 2:59 PM  Pain Score Disclaimer: We use the NRS-11 scale. This is a self-reported, subjective measurement of pain severity with only modest accuracy. It is used primarily to identify changes within a particular patient. It must be understood that outpatient pain scales are significantly less accurate that those used for research, where they can be applied under ideal controlled circumstances with minimal exposure to variables. In reality, the score is likely to be a combination of pain intensity and pain affect, where pain affect describes the degree of emotional arousal or changes in action readiness caused by the sensory experience of pain. Factors such as social and work situation, setting, emotional state, anxiety levels, expectation, and prior pain experience may influence pain perception and show large inter-individual differences that may also be  affected by time variables.  Patient instructions provided during this appointment: Patient Instructions   ____________________________________________________________________________________________  Medication Rules  Applies to: All patients receiving prescriptions (written or electronic).  Pharmacy of record: Pharmacy where electronic prescriptions will be sent. If written prescriptions are taken to a different pharmacy, please inform the nursing staff. The pharmacy listed in the electronic medical record should be the one where you would like electronic prescriptions to be sent.  Prescription refills: Only during scheduled appointments. Applies to both, written and electronic prescriptions.  NOTE: The following applies primarily to controlled substances (Opioid* Pain Medications).   Patient's responsibilities: 1. Pain Pills: Bring all pain pills to every appointment (except for procedure appointments). 2. Pill Bottles: Bring pills in original pharmacy bottle. Always bring newest bottle. Bring bottle, even if empty. 3. Medication refills: You are responsible for knowing and keeping track of what medications you need refilled. The day before your appointment, write a list of all prescriptions that need to be refilled. Bring that list to your appointment and give it to the admitting nurse. Prescriptions will be written only during appointments. If you forget a medication, it will not be "Called in", "Faxed", or "electronically sent". You will need to get another appointment to get these prescribed. 4. Prescription Accuracy: You are responsible for carefully inspecting your prescriptions before leaving our office. Have the discharge nurse carefully go over each prescription with you, before taking them home. Make sure that your name is accurately spelled, that your address is correct. Check the name and dose of your medication to make sure it is accurate. Check the number of pills, and the  written instructions to make sure they are clear and accurate. Make sure that you are given enough medication to last until your next medication refill appointment. 5. Taking Medication: Take medication as prescribed. Never take more pills than instructed. Never take medication more frequently than prescribed. Taking less pills  or less frequently is permitted and encouraged, when it comes to controlled substances (written prescriptions).  6. Inform other Doctors: Always inform, all of your healthcare providers, of all the medications you take. 7. Pain Medication from other Providers: You are not allowed to accept any additional pain medication from any other Doctor or Healthcare provider. There are two exceptions to this rule. (see below) In the event that you require additional pain medication, you are responsible for notifying us, as stated below. 8. Medication Agreement: You are responsible for carefully reading and following our Medication Agreement. This must be signed before receiving any prescriptions from our practice. Safely store a copy of your signed Agreement. Violations to the Agreement will result in no further prescriptions. (Additional copies of our Medication Agreement are available upon request.) 9. Laws, Rules, & Regulations: All patients are expected to follow all Federal and [employer], [employer], Rules, [employer]. Ignorance of the Laws does not constitute a valid excuse. The use of any illegal substances is prohibited. 10. Adopted CDC guidelines & recommendations: Target dosing levels will be at or below 60 MME/day. Use of benzodiazepines** is not recommended.  Exceptions: There are only two exceptions to the rule of not receiving pain medications from other Healthcare Providers. 1. Exception #1 (Emergencies): In the event of an emergency (i.e.: accident requiring emergency care), you are allowed to receive additional pain medication. However, you are responsible for: As soon as you  are able, call our office [phone], at any time of the day or night, and leave a message stating your name, the date and nature of the emergency, and the name and dose of the medication prescribed. In the event that your call is answered by a member of our staff, make sure to document and save the date, time, and the name of the person that took your information.  2. Exception #2 (Planned Surgery): In the event that you are scheduled by another doctor or dentist to have any type of surgery or procedure, you are allowed (for a period no longer than 30 days), to receive additional pain medication, for the acute post-op pain. However, in this case, you are responsible for picking up a copy of our "Post-op Pain Management for Surgeons" handout, and giving it to your surgeon or dentist. This document is available at our office, and does not require an appointment to obtain it. Simply go to our office during business hours (Monday-Thursday from 8:00 AM to 4:00 PM) (Friday 8:00 AM to 12:00 Noon) or if you have a scheduled appointment with [place], prior to your surgery, and ask for it by name. In addition, you will need to provide [place] with your name, name of your surgeon, type of surgery, and date of procedure or surgery.  *Opioid medications include: morphine, codeine, oxycodone, oxymorphone, hydrocodone, hydromorphone, meperidine, tramadol, tapentadol, buprenorphine, fentanyl, methadone. **Benzodiazepine medications include: diazepam (Valium), alprazolam (Xanax), clonazepam (Klonopine), lorazepam (Ativan), clorazepate (Tranxene), chlordiazepoxide (Librium), estazolam (Prosom), oxazepam (Serax), temazepam (Restoril), triazolam (Halcion) (Last updated: 01/29/2018) ____________________________________________________________________________________________    BMI Assessment: Estimated body mass index is 33 kg/m as calculated from the following:   Height as of this encounter: _0  (1.778 m).   Weight as of  this encounter: 230 lb (104.3 kg).  BMI interpretation table: BMI level Category Range association with higher incidence of chronic pain  <18 kg/m2 Underweight   18.5-24.9 kg/m2 Ideal body weight   25-29.9 kg/m2 Overweight Increased incidence by 20%  30-34.9 kg/m2 Obese (Class I) Increased incidence  by 68%  35-39.9 kg/m2 Severe obesity (Class II) Increased incidence by 136%  >40 kg/m2 Extreme obesity (Class III) Increased incidence by 254%   Patient's current BMI Ideal Body weight  Body mass index is 33 kg/m. Ideal body weight: 73 kg (160 lb 15 oz) Adjusted ideal body weight: 85.5 kg (188 lb 9 oz)   BMI Readings from Last 4 Encounters:  09/14/18 33.00 kg/m  08/13/18 34.58 kg/m  07/21/18 34.44 kg/m  07/13/18 33.15 kg/m   Wt Readings from Last 4 Encounters:  09/14/18 230 lb (104.3 kg)  08/13/18 241 lb (109.3 kg)  07/21/18 240 lb (108.9 kg)  07/13/18 231 lb (104.8 kg)

## 2018-09-15 ENCOUNTER — Encounter (HOSPITAL_COMMUNITY): Payer: Medicare Other

## 2018-09-17 ENCOUNTER — Encounter (HOSPITAL_COMMUNITY): Payer: Medicare Other

## 2018-09-23 ENCOUNTER — Other Ambulatory Visit: Payer: Self-pay | Admitting: Primary Care

## 2018-09-23 DIAGNOSIS — M1712 Unilateral primary osteoarthritis, left knee: Secondary | ICD-10-CM | POA: Diagnosis not present

## 2018-09-23 NOTE — Telephone Encounter (Signed)
Send to [employer].

## 2018-09-23 NOTE — Telephone Encounter (Signed)
Last prescribed on by Thad Ranger  Last office visit on 6/201/2019

## 2018-09-24 ENCOUNTER — Other Ambulatory Visit: Payer: Self-pay | Admitting: Primary Care

## 2018-09-24 ENCOUNTER — Other Ambulatory Visit: Payer: Self-pay | Admitting: Internal Medicine

## 2018-09-24 DIAGNOSIS — J449 Chronic obstructive pulmonary disease, unspecified: Secondary | ICD-10-CM

## 2018-09-30 ENCOUNTER — Encounter: Payer: Self-pay | Admitting: Primary Care

## 2018-09-30 ENCOUNTER — Ambulatory Visit (INDEPENDENT_AMBULATORY_CARE_PROVIDER_SITE_OTHER): Payer: Medicare Other | Admitting: Primary Care

## 2018-09-30 VITALS — BP 122/70 | HR 86 | Temp 97.8°F | Ht 70.0 in | Wt 240.5 lb

## 2018-09-30 DIAGNOSIS — G4700 Insomnia, unspecified: Secondary | ICD-10-CM

## 2018-09-30 DIAGNOSIS — G8929 Other chronic pain: Secondary | ICD-10-CM | POA: Diagnosis not present

## 2018-09-30 DIAGNOSIS — M25562 Pain in left knee: Secondary | ICD-10-CM

## 2018-09-30 DIAGNOSIS — M25561 Pain in right knee: Secondary | ICD-10-CM

## 2018-09-30 DIAGNOSIS — E119 Type 2 diabetes mellitus without complications: Secondary | ICD-10-CM | POA: Diagnosis not present

## 2018-09-30 DIAGNOSIS — G894 Chronic pain syndrome: Secondary | ICD-10-CM | POA: Diagnosis not present

## 2018-09-30 MED ORDER — ZOLPIDEM TARTRATE 5 MG PO TABS: 5.0000 mg | ORAL_TABLET | Freq: Every evening | ORAL | 0 refills | Status: DC | PRN

## 2018-09-30 MED ORDER — GLIPIZIDE ER 10 MG PO TB24: 10.0000 mg | ORAL_TABLET | Freq: Every day | ORAL | 3 refills | Status: DC

## 2018-09-30 NOTE — Assessment & Plan Note (Signed)
Trazodone ineffective, likely due to chronic left knee pain. In an attempt to have him off of narcotics, will trial low dose zolpidem to help with sleep. Discussed potential side effects. He will update in 3-4 days.

## 2018-09-30 NOTE — Assessment & Plan Note (Signed)
Completely off of Nucynta, no longer following with pain management. Following with orthopedics and is using Vicodin to help with sleep. Will treat insomnia in hopes of preventing narcotic use.   Continue follow up with ortho for chronic left knee pain.

## 2018-09-30 NOTE — Assessment & Plan Note (Signed)
A1C of 7.1 in August 2019, this is well controlled. Switch to Glipizide ER 10 mg once daily to prevent side effects, did discuss the importance of eating with medications. Also discussed to check glucose when he experiences those symptoms.   Continue metformin and Jardiance. Foot exam today. Managed on statin. Urine microalbumin negative in June 2019.  Follow up in 6 months.

## 2018-09-30 NOTE — Assessment & Plan Note (Signed)
Following with orthopedics. S/P partial replacement in August 2019.

## 2018-09-30 NOTE — Progress Notes (Signed)
Subjective:    Patient ID: Joseph [name], male    DOB: 11-08-1971, 47 y.o.   MRN: [id number]  HPI  Joseph [name] is a 47 year old male who presents today for follow up and to discuss insomnia.   1) Type 2 Diabetes:   Current medications include: Glipizide 5 mg (10 mg) BID, Jardiance 10 mg daily, metformin ER 500 mg (1000 mg) daily.   He does not check his blood sugars. He does have intermittent symptoms of feeling jittery/shakey, will eat candy/drink soda with improvement. He admits that he doesn't eat much for breakfast when taking glipizide.   Last A1C: 7.1 in August 2019 Last Eye Exam: Due in December 2019 Last Foot Exam: Due ACE/ARB: Urine microalbumin negative in June 2019 Statin: atorvastatin     BP Readings from Last 3 Encounters:  09/30/18 122/70  09/14/18 127/84  08/13/18 (!) 119/92    2) Chronic Pain/Insomnia: Currently following with [facility] in [place]. He was recently dismissed from the pain management office for violation of his pain contract. He did not bring in his medication bottle into his last appointment as his wife threw away his medications accidentally.  He is currently managed on Nucynta for which he's not taken in three weeks. He continues to struggle with left knee pain. History of left partial knee replacement in August 2019. He is following with orthopedics and is managed on Vicodin. He is due for a follow up visit in 6 weeks.   He mostly takes the Vicodin to help him sleep. He does take Trazodone which helped with sleep initially before his knee pain, but he will wake up during the night now with pain. He does use a cane intermittently due to pain. He's wanting to try something else for insomnia so that he doesn't have to take narcotics to help him sleep. He is interested in Ambien.  Review of Systems  Respiratory: Negative for shortness of breath.   Cardiovascular: Negative for chest pain.  Musculoskeletal: Positive for arthralgias.    Neurological: Negative for dizziness and numbness.  Psychiatric/Behavioral: Positive for sleep disturbance.       Past Medical History:  Diagnosis Date  . Anxiety   . Arthritis    oa left knee and lower back  . Asthma   . Cataract    hx of bilateral  . Chronic back pain   . Chronic pain of left knee   . COPD (chronic obstructive pulmonary disease) (HCC)   . Diabetes mellitus    type 2  . GERD (gastroesophageal reflux disease)   . INGUINAL PAIN, LEFT 10/03/2009   Qualifier: Diagnosis of  By: Darrick Penna MD, Sandi L   . Overdose    03-24-18 accidental  . Shortness of breath    with heavy exertion  . Sleep apnea      Social History   Socioeconomic History  . Marital status: Significant Other    Spouse name: Not on file  . Number of children: Not on file  . Years of education: Not on file  . Highest education level: Not on file  Occupational History  . Occupation: disability  Social Needs  . Financial resource strain: Not on file  . Food insecurity:    Worry: Not on file    Inability: Not on file  . Transportation needs:    Medical: Not on file    Non-medical: Not on file  Tobacco Use  . Smoking status: Current Every Day Smoker  Packs/day: 1.00    Years: 26.00    Pack years: 26.00    Types: Cigarettes    Start date: 12/17/1993  . Smokeless tobacco: Never Used  . Tobacco comment: 1ppd as of 11/06/17 ep  Substance and Sexual Activity  . Alcohol use: Never    Alcohol/week: 0.0 standard drinks    Frequency: Never  . Drug use: Never  . Sexual activity: Yes    Birth control/protection: Surgical  Lifestyle  . Physical activity:    Days per week: Not on file    Minutes per session: Not on file  . Stress: Not on file  Relationships  . Social connections:    Talks on phone: Not on file    Gets together: Not on file    Attends religious service: Not on file    Active member of club or organization: Not on file    Attends meetings of clubs or organizations: Not on  file    Relationship status: Not on file  . Intimate partner violence:    Fear of current or ex partner: Not on file    Emotionally abused: Not on file    Physically abused: Not on file    Forced sexual activity: Not on file  Other Topics Concern  . Not on file  Social History Narrative   Recent visit to [facility] in [place] d/t increased pain where he was prescribed percocet 7.5-325 mg for pain.    Past Surgical History:  Procedure Laterality Date  . CARPAL TUNNEL RELEASE Left   . CATARACT EXTRACTION W/PHACO Right 03/11/2016   Procedure: CATARACT EXTRACTION PHACO AND INTRAOCULAR LENS PLACEMENT (IOC);  Surgeon: Gemma Payor, MD;  Location: [facility];  Service: Ophthalmology;  Laterality: Right;  CDE 4.24  . EYE SURGERY Right 2018   ioc with lens replacement  . HERNIA REPAIR Bilateral 2010   umbilical  . KNEE ARTHROSCOPY WITH MEDIAL MENISECTOMY Left 04/22/2014   Procedure: LEFT KNEE ARTHROSCOPY WITH MEDIAL MENISECTOMY;  Surgeon: Vickki Hearing, MD;  Location: [facility];  Service: Orthopedics;  Laterality: Left;  . KNEE ARTHROSCOPY WITH MEDIAL MENISECTOMY Right 12/06/2015   Procedure: RIGHT KNEE ARTHROSCOPY WITH MEDIAL MENISECTOMY;  Surgeon: Vickki Hearing, MD;  Location: [facility];  Service: Orthopedics;  Laterality: Right;  . KNEE ARTHROSCOPY WITH MEDIAL MENISECTOMY Left 03/20/2017   Procedure: KNEE ARTHROSCOPY WITH MEDIAL MENISECTOMY;  Surgeon: Vickki Hearing, MD;  Location: [facility];  Service: Orthopedics;  Laterality: Left;  . ORIF WRIST FRACTURE Left 12/19/2016   Procedure: OPEN REDUCTION INTERNAL FIXATION (ORIF) WRIST FRACTURE;  Surgeon: Betha Loa, MD;  Location: [facility];  Service: Orthopedics;  Laterality: Left;  accumed   . PARTIAL KNEE ARTHROPLASTY Left 07/21/2018   Procedure: LEFT UNICOMPARTMENTAL KNEE;  Surgeon: Sheral Apley, MD;  Location: [facility];  Service: Orthopedics;  Laterality: Left;  . SHOULDER ARTHROCENTESIS Right 2008 or 2010  . VASECTOMY  2014     Family History  Problem Relation Age of Onset  . Emphysema Maternal Grandfather   . Emphysema Maternal Grandmother   . Clotting disorder Maternal Grandmother     Allergies  Allergen Reactions  . Azithromycin Hives  . Clarithromycin   . Erythromycin Hives  . Keflex [Cephalexin] Nausea Only  . Metformin Nausea And Vomiting  . Symbicort [Budesonide-Formoterol Fumarate] Other (See Comments)    States that 3 doses were used and breathing became worse    Current Outpatient Medications on File Prior to Visit  Medication Sig Dispense Refill  .  albuterol (PROVENTIL) (2.5 MG/3ML) 0.083% nebulizer solution USE 1 VIAL VIA NEBULIZER FOUR TIMES DAILY AS NEEDED FOR SHORTNESS OF BREATH 360 mL 0  . ANDROGEL PUMP 20.25 MG/ACT (1.62%) GEL Apply 2 Pump topically daily.     Marland Kitchen aspirin EC 81 MG tablet Take 1 tablet (81 mg total) by mouth 2 (two) times daily. For DVT prophylaxis 60 tablet 0  . atorvastatin (LIPITOR) 20 MG tablet TAKE ONE (1) TABLET EACH DAY EVERY EVENING 90 tablet 1  . betamethasone dipropionate (DIPROLENE) 0.05 % cream Apply topically 2 (two) times daily as needed.     Marland Kitchen buPROPion (WELLBUTRIN SR) 150 MG 12 hr tablet TAKE ONE TABLET BY MOUTH TWICE A DAY 180 tablet 1  . citalopram (CELEXA) 40 MG tablet TAKE ONE TABLET BY MOUTH ONCE DAILY 30 tablet 11  . diphenhydrAMINE (BENADRYL) 25 MG tablet Take 25 mg by mouth 2 (two) times daily.    Marland Kitchen docusate sodium (COLACE) 100 MG capsule Take 1 capsule (100 mg total) by mouth 2 (two) times daily. To prevent constipation while taking pain medication. 60 capsule 0  . fluticasone furoate-vilanterol (BREO ELLIPTA) 200-25 MCG/INH AEPB Inhale 1 puff into the lungs daily. 28 each 0  . ipratropium (ATROVENT) 0.02 % nebulizer solution USE 1 VIAL VIA NEBULIZER FOUR TIMES DAILY AS NEEDED 300 mL 0  . Ipratropium-Albuterol (COMBIVENT) 20-100 MCG/ACT AERS respimat Inhale 1 puff into the lungs every 6 (six) hours as needed for wheezing. 1 Inhaler 2  . JARDIANCE  10 MG TABS tablet TAKE ONE TABLET BY MOUTH EVERY MORNING. 90 tablet 1  . lansoprazole (PREVACID) 30 MG capsule TAKE 1 CAPSULE BY MOUTH TWICE A DAY BEFORE A MEAL 180 capsule 1  . metFORMIN (GLUCOPHAGE-XR) 500 MG 24 hr tablet Take 1,000 mg by mouth daily with breakfast.    . methocarbamol (ROBAXIN) 500 MG tablet Take 1 tablet (500 mg total) by mouth every 8 (eight) hours as needed for muscle spasms. 40 tablet 0  . Multiple Vitamin (MULTIVITAMIN WITH MINERALS) TABS Take 1 tablet by mouth every morning. Men's once daily multivitamin packet (vitamin e, calcium, ginseng)    . ondansetron (ZOFRAN) 4 MG tablet Take 1 tablet (4 mg total) by mouth every 8 (eight) hours as needed for nausea or vomiting. 20 tablet 0  . Saw Palmetto, Serenoa repens, (SAW PALMETTO PO) Take 1 capsule by mouth daily.    . tamsulosin (FLOMAX) 0.4 MG CAPS capsule TAKE ONE CAPSULE BY MOUTH DAILY 90 capsule 1  . traZODone (DESYREL) 150 MG tablet TAKE ONE TABLET (150MG  TOTAL) BY MOUTH AT BEDTIME AS NEEDED FOR SLEEP 90 tablet 1  . Turmeric 500 MG CAPS Take 1 capsule by mouth daily.    Marland Kitchen gabapentin (NEURONTIN) 300 MG capsule Take 1 capsule (300 mg total) by mouth 3 (three) times daily for 14 days. For 2 weeks post op for pain. 42 capsule 0   No current facility-administered medications on file prior to visit.     BP 122/70   Pulse 86   Temp 97.8 F (36.6 C) (Oral)   Ht 5\' 10"  (1.778 m)   Wt 240 lb 8 oz (109.1 kg)   SpO2 97%   BMI 34.51 kg/m    Objective:   Physical Exam  Constitutional: He appears well-nourished.  Neck: Neck supple.  Cardiovascular: Normal rate and regular rhythm.  Respiratory: Effort normal and breath sounds normal.  Neurological:  Ambulatory in clinic with cane  Skin: Skin is warm and dry.  Assessment & Plan:

## 2018-09-30 NOTE — Patient Instructions (Addendum)
We've switched your glipizide to ER 10 mg. Take 1 tablet once daily with breakfast.  Continue metformin and Jardiance for diabetes.  Start exercising. You should be getting 150 minutes of exercise weekly.  It is important that you improve your diet. Please limit carbohydrates in the form of white bread, rice, pasta, sweets, fast food, fried food, sugary drinks, etc. Increase your consumption of fresh fruits and vegetables, whole grains, lean protein.  Ensure you are consuming 64 ounces of water daily.  Start zolpidem 5 mg tablets for insomnia. Take 1 tablet by mouth at bedtime. Please update me in 3-4 days.  Stop trazodone.   Please schedule a follow up appointment in 6 months for diabetes check and follow up.  It was a pleasure to see you today!

## 2018-10-05 DIAGNOSIS — G8929 Other chronic pain: Secondary | ICD-10-CM

## 2018-10-05 DIAGNOSIS — M25561 Pain in right knee: Secondary | ICD-10-CM

## 2018-10-05 DIAGNOSIS — G4700 Insomnia, unspecified: Secondary | ICD-10-CM

## 2018-10-05 DIAGNOSIS — M25562 Pain in left knee: Secondary | ICD-10-CM

## 2018-10-09 MED ORDER — ZOLPIDEM TARTRATE 10 MG PO TABS: 10.0000 mg | ORAL_TABLET | Freq: Every evening | ORAL | 0 refills | Status: DC | PRN

## 2018-10-12 ENCOUNTER — Telehealth: Payer: Self-pay | Admitting: Internal Medicine

## 2018-10-12 DIAGNOSIS — J449 Chronic obstructive pulmonary disease, unspecified: Secondary | ICD-10-CM

## 2018-10-12 MED ORDER — FLUTICASONE FUROATE-VILANTEROL 200-25 MCG/INH IN AEPB: 1.0000 | INHALATION_SPRAY | Freq: Every day | RESPIRATORY_TRACT | 0 refills | Status: DC

## 2018-10-12 NOTE — Telephone Encounter (Signed)
Called and spoke to patient, requesting refill of Breo be sent to [facility] pharmacy, refill sent. Confirmed appt in December. Voiced understanding, expressed thanks. Nothing further is needed at this time.

## 2018-11-03 ENCOUNTER — Other Ambulatory Visit: Payer: Self-pay | Admitting: Primary Care

## 2018-11-03 DIAGNOSIS — G8929 Other chronic pain: Secondary | ICD-10-CM

## 2018-11-03 DIAGNOSIS — M25561 Pain in right knee: Principal | ICD-10-CM

## 2018-11-03 DIAGNOSIS — M25562 Pain in left knee: Principal | ICD-10-CM

## 2018-11-03 DIAGNOSIS — G894 Chronic pain syndrome: Secondary | ICD-10-CM

## 2018-11-03 NOTE — Telephone Encounter (Signed)
There is a message through MyChart regarding this refill. Last seen on 09/30/2018

## 2018-11-03 NOTE — Telephone Encounter (Signed)
Noted.  Refill sent to pharmacy. Please notify patient that the proper dose of this medication is 1 capsule once daily as needed for pain.  The prescription was written for twice daily but this is not safe long-term.  This medication can potentially cause kidney damage over time.

## 2018-11-04 MED ORDER — GABAPENTIN 300 MG PO CAPS: 600.0000 mg | ORAL_CAPSULE | Freq: Two times a day (BID) | ORAL | 3 refills | Status: DC

## 2018-11-04 NOTE — Telephone Encounter (Signed)
Message left for patient to return my call.  

## 2018-11-04 NOTE — Telephone Encounter (Signed)
Pt returned chan's call °

## 2018-11-04 NOTE — Telephone Encounter (Signed)
Spoken and notified patient of Joseph GunningKate [name]'s comments. Patient verbalized understanding.  Also patient stated that he send a MyChart message that, it stated that Joseph [name] is okay refill his gabapentin (NEURONTIN) 300 MG capsule.  Patient is taking 2 capsule BID. He stated that this helps him a lot and he does not have take as much Norco.

## 2018-11-04 NOTE — Telephone Encounter (Signed)
Noted.  Refill sent to pharmacy for gabapentin.

## 2018-11-10 ENCOUNTER — Ambulatory Visit (INDEPENDENT_AMBULATORY_CARE_PROVIDER_SITE_OTHER): Payer: Medicare Other | Admitting: Internal Medicine

## 2018-11-10 ENCOUNTER — Encounter: Payer: Self-pay | Admitting: Internal Medicine

## 2018-11-10 VITALS — BP 148/80 | HR 94 | Ht 70.0 in | Wt 245.0 lb

## 2018-11-10 DIAGNOSIS — Z23 Encounter for immunization: Secondary | ICD-10-CM

## 2018-11-10 DIAGNOSIS — J45909 Unspecified asthma, uncomplicated: Secondary | ICD-10-CM

## 2018-11-10 DIAGNOSIS — B370 Candidal stomatitis: Secondary | ICD-10-CM

## 2018-11-10 DIAGNOSIS — F17210 Nicotine dependence, cigarettes, uncomplicated: Secondary | ICD-10-CM | POA: Diagnosis not present

## 2018-11-10 HISTORY — DX: Candidal stomatitis: B37.0

## 2018-11-10 NOTE — Assessment & Plan Note (Addendum)
-   PFT's s sign copd 06/2012 - med calendar 2/13 /15 > not using 11/29/14  - 11/29/2014 p extensive coaching HFA effectiveness =    90% from a baseline of 50%  -PFTs wnl 03/08/2015 4 h p alb neb> rec MCT at least 6 h p neb > done 10/12/2015  POS > resume dulera 100 2bid - 10/31/2015    increase dulera to 200 bid (samples provided)  - 10/31/2015    increase dulera to 200 bid (samples provided)  - 05/23/2017  BREO 200 rx initiated > much better  - Spirometry 11/10/2018  FEV1 3.0 (76%)  Ratio 84 s curvature after am BREO 200     - The proper method of use, as well as anticipated side effects, of an elipta inhaler are discussed and demonstrated to the patient.

## 2018-11-10 NOTE — Assessment & Plan Note (Signed)
4-5 min discussion re active cigarette smoking in addition to office E&M  Ask about tobacco use:   Ongoing  Advise quitting   I took an extended  opportunity with this patient to outline the consequences of continued cigarette use  in airway disorders based on all the data we have from the multiple national lung health studies (perfomed over decades at millions of dollars in cost)  indicating that smoking cessation, not choice of inhalers or physicians, is the most important aspect of his care.   Assess willingness:  Considering hypnosis > encouraged  Assist in quit attempt:  Per PCP when ready Arrange follow up:   Follow up per Primary Care planned

## 2018-11-10 NOTE — Progress Notes (Signed)
Subjective:   Patient ID: Joseph [name], male    DOB: [birth year]   MRN: [id number]    Brief patient profile:  58  yowm active smoker without significant airflow obst by PFTs 06/02/12 and 03/08/2015    History of Present Illness  In April 2013 cc  had pneumonia apparently. Went to [facility] ER and found some abnormality (nodule based on review of ER notes) on CXR that resulted in CT Angio 03/07/12 same visit. PE ruled out but found to have mediastinal nodes but the cxr nodule was not there. Apparently ER doctor said it was lymphoma and asked to see an oncologist. But primary care doctor felt that was over zealous and referred patient here. He prefers conservative mgmt to nodes if clinical suspicion for cancer is low  cc lack of energy (spent most of days yesterday in bed), esp in pollen, chronic dyspnea on exertion for 180 feet relieved by rest  But slowly progressive since 2002 for which he uses albuterol prn on average 4 times a day.  Walkt test 185 feet x 3 laps: no desaturation  He smokes at baseline; trying quit, prior intolerance (dreams) with chantix.   Only baseline mild chronic cough with associtaed wheeze that is occasional.. Does have some B symptoms like nocturnal diaphoresis (soaks pillows). Denies weight loss.   CT ANGIOGRAPHY CHEST  Comparison: 03/07/2012  An abnormal right lower paratracheal node has a short axis diameter  of 1.6 cm. An abnormal AP window lymph node has a short axis  diameter of 1.4 cm. Mild bilateral hilar and infrahilar adenopathy  noted. Scattered small axillary lymph nodes are present.  A subcarinal node has a short axis diameter of 1.5 cm.     OV 08/14/2012 Mediastinal nodes: PET scan 06/10/12 shows very low uptake in the mediastinal nodes. Suspicion for cancer is very low. Lymph nodes include 13 mm right lower paratracheal lymph node and 10 mm prevascular lymph node. ACE level 04/10/12  - is in 20s and normal  Still dyspneic: 1 flught of steps and better  with rest and with albuterol and atrovent prn. Dyspne also worse in winter and cooler weathers. Rates it as moderate. STable since last visit to slightly worse. thre is some associtaed cough. No asociated weight loss. COugh is moderate and worst early in morning and in cooler temperatues.. Denies sinus drainage. PFts show mixed picture - fvc 3.4L/69%, fev1 2.9/76%, Ratio 85 but 12% bd respose. TLC 5.7/83%. DLCO 23.7/69% rec rec #MEdiastinal node  - suspicion for cancer is low  - you will need CT chest in a year; we will schedule it later  #Smoking  - glad you are working on quitting  #Shortness of breath - this is due to asthma/copd and weight  - please start QVAR 2 puff twice daily - take sample and show technique and take prescription too  - focus on weight loss through the low glycemic diet; take diet sheet from [place]  #FOllowup  - 3 months with spirometry at followup  - flu shot and pneumovax through PMD as discussed   12/17/2013  acute ov/Joseph [name] still active smoker re: recurrent pattern of coughing x a decade intermittent / out of control since nov 2014/ can't take symbicort makes it worse / on percocet one tid at baseline  Chief Complaint  Patient presents with  . Acute Visit    MR pt.  C/o mostly nonprod cough with some thick white mucous, coughing until he almost passes out.  Went to  [facility] last week.    duoneb daily  Sometime skips the doses later in the day but always uses the am dose, sputum to purulent. Mostly sob with cough/ otherwise breaths ok Last neb was 4 h prior to OV   rec For Cough - use the flutter valve as much as you can plus mucinex dm 1200 mg every 12 hours as needed supplement with percocet up to 2 every 4hours For Breathing  Only use your duoneb  rescue medication . - Ok to use up to    every 4 hours if you must  Prednisone 10 mg take  4 each am x 2 days,   2 each am x 2 days,  1 each am x 2 days and stop  Pantoprazole (protonix) 40 mg   Take 30-60 min  before first meal of the day and Pepcid 20 mg one bedtime until return to office - this is the best way to tell whether stomach acid is contributing to your problem.  GERD  Diet . Please schedule a follow up office visit in 2 weeks, sooner if needed with all meds in hand    07/27/2014 f/u ov/Joseph [name] re: asthma / still smoking  Chief Complaint  Patient presents with  . Follow-up    Pt c/o increased SOB and cough for the past month- cough is occ prod with minimal clear sputum. Pt currently taking clindamycin for staph infection. Rarely using albuterol inhaler, but is using neb approx 4 times per day.   off dulera x 1.5 week denies more sob/ cough or need for rescue on vs off rec Plan A=  dulera 200 Take 2 puffs first thing in am and then another 2 puffs about 12 hours later (or formulary alternative- call us if you find a cheaper one)  Plan B =  Backup = proaire  Plan C = Backup plan B = nebulized albuterol up to 4 hours but call if needing this much at all The key is to stop smoking completely before smoking completely stops you!    11/29/2014 f/u ov/Joseph [name] re: s/p "aecopd" still smoking  Chief Complaint  Patient presents with  . Follow-up    Pt states that his SOB and cough have been worse for the past several wks. He went to ED for COPD exacerbation on 11/14/14.  He is using proair 3-4 times per day and neb about 3 times per day on average.   one month prior to flare needing proair 1-2 per day and neb at least once or twice also - using neb first thing instead of dulera  Better p pred from er then worse again w/in a week of taper off assoc with congested cough but mostly white mucus esp in ams rec Think of your respiratory medications as multiple steps you can take to control your symptoms and avoid having to go to the ER Plan A is your maintenance daily no matter what meds: dulera Take 2 puffs first thing in am and then another 2 puffs about 12 hours later.  Plan B only use after you've used  your maintenance (Plan A) medication, and only if you can't catch your breath: Proair 2 every 4 hours as needed  Plan C only use after you've used plan A and B and still can't catch your breath: nebulizer albuterol 2.5 mg up to every 4 hours  Plan D(for Doctor):  If you've used A thru C and not doing a lot better or still needing C more  than a once a day,  D = call the doctor for evaluation asap Plan E (for ER):  If still not able to catch your breath, even after using your nebulizer up to every 4 hours, go to ER  For cough > delsym 2 tsp every 12 hours is the best you can buy Prevacid 30 mg Take 30-60 min before first meal of the day  GERD diet      03/08/2015 f/u ov/Zita Ozimek re: still smoking / off dulera x 48 h and off saba hfa/ using neb 8 am of test /no change in symptoms or need for saba on or off dulera  Chief Complaint  Patient presents with  . Follow-up    PFT done today. Pt states that his breathing is about the same since last visit. He is using albuterol inhaler approx 3 x per day.   breathing does better if stays in house,  worse out in cold Even on dulera still using neb at least twice daily  Some sporadic dry fits of  cough with bending over even on dulera but not using ppi correctly  rec Prevacid 30 mg should be to Take 30- 60 min before your first and last meals of the day  I see no evidence that the dulera is helping you and would stop it at this point pending an asthma challenge test  Please see patient coordinator before you leave today  to schedule Methacholine test - do not take any albuterol with in 6 hours of the test> never done     10/03/2015  f/u ov/Tyianna Menefee re: still smoking /  ? asthma vs all vcd/ gerd  Chief Complaint  Patient presents with  . Follow-up    Pt states his breathing is overall doing well. He is using rescue inhaler and neb several times per day.   Wife left him in July 2016 and losing wt, dm better, breathing not since stopped dulera and never went for  mct and can't afford it  No noct symptoms after hs saba/ cpap  saba use up when no access to dulera 200 but even on it he continues to perceive need for saba daily despite nl exams/ pfts rec Please see patient coordinator before you leave today  to schedule a methacholine challenge test>  POSITIVE Ok to resume dulera but stop it 24 hours before the methacholine and no albuterol w/in 6 hours  Please schedule a follow up office visit in 4 weeks, sooner if needed     10/31/2015  f/u ov/Corinne Goucher re: mild persistent asthma  Chief Complaint  Patient presents with  . Follow-up    Breathing has been worse since decrease in [place].  He is using rescue inhaler 4-5 x per day and duoneb 2 x per day.   Noted a change on the dulera 100 but can't afford dulera in any form/ reliant on samples rec Increase the dulera 200 Take 2 puffs first thing in am and then another 2 puffs about 12 hours later.  Only use your albuterol (proair) as a rescue medication   Only use the nebulizer if you try the Proair first and it doesn't work    12/12/2015  f/u ov/Adriyanna Christians re: intrinsic asthma  Doing fine as long as stays out of cold and on dulera 200 despite still smoking   Chief Complaint  Patient presents with  . Follow-up    Breathing has improved some. He is using rescue inhaler "just when I go outside". He uses neb  5 x per wk on average.   rec No change in medications Keep working on getting rid of the cigarettes before they get rid of you   NP ov 05/07/17  We will give you samples of BREO today. Use this 1 puff once daily Use the Breo in place of [place].   05/23/2017  f/u ov/Eilyn Polack re: intrinsic asthma/ BREO 200 / prn neb  Chief Complaint  Patient presents with  . Follow-up    Breathing has improved on Breo. He has only had to use neb x 3 since last visit. He does not currently have a rescue inhaler.   Not limited by breathing from desired activities  But not very active rec Plan A = Automatic = BREO 200 one  each am  Work on inhaler technique:    Plan B = Backup Only use your  combivent respimat  as a rescue medication  Plan C = Crisis - only use your albuterol nebulizer if you first try Plan B     11/10/2018  f/u ov/Jamika Sadek re: asthma/ still smoking  Chief Complaint  Patient presents with  . Follow-up    Breathing is overall doing well. He states when it is really cold out he uses his neb more- approx 5 x per wk. He does not use his combivent.   Dyspnea:  Fine as long as remembers to use breo 200 and it's not real cold outside, does fine in heat  Cough: none Sleeping: on cpap per Vassie Loll  s concerns SABA use: only on cold days then uses neb up to 3 x in a day if out it in, lasts "a few hours"  02: none   Thinking about hypnosis for cig smoking/ could not tol chantix    No obvious day to day or daytime variability or assoc excess/ purulent sputum or mucus plugs or hemoptysis or cp or chest tightness, subjective wheeze or overt sinus or hb symptoms.   Sleeping as above  without nocturnal  or early am exacerbation  of respiratory  c/o's or need for noct saba. Also denies any obvious fluctuation of symptoms with weather or environmental changes or other aggravating or alleviating factors except as outlined above   No unusual exposure hx or h/o childhood pna/ asthma or knowledge of premature birth.  Current Allergies, Complete Past Medical History, Past Surgical History, Family History, and Social History were reviewed in [employer] record.  ROS  The following are not active complaints unless bolded Hoarseness, sore throat, dysphagia, dental problems, itching, sneezing,  nasal congestion or discharge of excess mucus or purulent secretions, ear ache,   fever, chills, sweats, unintended wt loss or wt gain, classically pleuritic or exertional cp,  orthopnea pnd or arm/hand swelling  or leg swelling, presyncope, palpitations, abdominal pain, anorexia, nausea, vomiting, diarrhea   or change in bowel habits or change in bladder habits, change in stools or change in urine, dysuria, hematuria,  rash, arthralgias, visual complaints, headache, numbness, weakness or ataxia or problems with walking or coordination,  change in mood= anxiety  or  memory.        Current Meds  Medication Sig  . albuterol (PROVENTIL) (2.5 MG/3ML) 0.083% nebulizer solution USE 1 VIAL VIA NEBULIZER FOUR TIMES DAILY AS NEEDED FOR SHORTNESS OF BREATH  . ANDROGEL PUMP 20.25 MG/ACT (1.62%) GEL Apply 2 Pump topically daily.   Marland Kitchen aspirin EC 81 MG tablet Take 1 tablet (81 mg total) by mouth 2 (two) times daily. For DVT prophylaxis  .  atorvastatin (LIPITOR) 20 MG tablet TAKE ONE (1) TABLET EACH DAY EVERY EVENING  . betamethasone dipropionate (DIPROLENE) 0.05 % cream Apply topically 2 (two) times daily as needed.   Marland Kitchen buPROPion (WELLBUTRIN SR) 150 MG 12 hr tablet TAKE ONE TABLET BY MOUTH TWICE A DAY  . celecoxib (CELEBREX) 200 MG capsule Take 1 capsule by mouth once daily as needed for pain.  . citalopram (CELEXA) 40 MG tablet TAKE ONE TABLET BY MOUTH ONCE DAILY  . diphenhydrAMINE (BENADRYL) 25 MG tablet Take 25 mg by mouth 2 (two) times daily.  Marland Kitchen docusate sodium (COLACE) 100 MG capsule Take 1 capsule (100 mg total) by mouth 2 (two) times daily. To prevent constipation while taking pain medication.  . fluticasone furoate-vilanterol (BREO ELLIPTA) 200-25 MCG/INH AEPB Inhale 1 puff into the lungs daily.  Marland Kitchen gabapentin (NEURONTIN) 300 MG capsule Take 2 capsules (600 mg total) by mouth 2 (two) times daily.  Marland Kitchen glipiZIDE (GLIPIZIDE XL) 10 MG 24 hr tablet Take 1 tablet (10 mg total) by mouth daily with breakfast. For diabetes.  Marland Kitchen HYDROcodone-acetaminophen (NORCO/VICODIN) 5-325 MG tablet   . ipratropium (ATROVENT) 0.02 % nebulizer solution USE 1 VIAL VIA NEBULIZER FOUR TIMES DAILY AS NEEDED  . Ipratropium-Albuterol (COMBIVENT) 20-100 MCG/ACT AERS respimat Inhale 1 puff into the lungs every 6 (six) hours as needed for  wheezing.  Marland Kitchen JARDIANCE 10 MG TABS tablet TAKE ONE TABLET BY MOUTH EVERY MORNING.  Marland Kitchen lansoprazole (PREVACID) 30 MG capsule TAKE 1 CAPSULE BY MOUTH TWICE A DAY BEFORE A MEAL  . metFORMIN (GLUCOPHAGE-XR) 500 MG 24 hr tablet Take 1,000 mg by mouth daily with breakfast.  . methocarbamol (ROBAXIN) 500 MG tablet Take 1 tablet (500 mg total) by mouth every 8 (eight) hours as needed for muscle spasms.  . Multiple Vitamin (MULTIVITAMIN WITH MINERALS) TABS Take 1 tablet by mouth every morning. Men's once daily multivitamin packet (vitamin e, calcium, ginseng)  . ondansetron (ZOFRAN) 4 MG tablet Take 1 tablet (4 mg total) by mouth every 8 (eight) hours as needed for nausea or vomiting.  . Saw Palmetto, Serenoa repens, (SAW PALMETTO PO) Take 1 capsule by mouth daily.  . tamsulosin (FLOMAX) 0.4 MG CAPS capsule TAKE ONE CAPSULE BY MOUTH DAILY  . traZODone (DESYREL) 150 MG tablet TAKE ONE TABLET (150MG  TOTAL) BY MOUTH AT BEDTIME AS NEEDED FOR SLEEP  . Turmeric 500 MG CAPS Take 1 capsule by mouth daily.                Objective:   Physical Exam     12/31/2013  294  >    07/27/2014  286 > 11/29/2014   289> 03/08/2015  284 > 10/03/2015 266 > 10/31/2015 273 > 12/12/2015    280 > 05/23/2017  245 > 11/10/2018  245    amb obese robust  wm nad    Vital signs reviewed - Note on arrival 02 sats  97 % on RA     Thrush o/w neg exam x obesity   HEENT: nl dentition, turbinates bilaterally, and oropharynx x for thrush . Nl external ear canals without cough reflex   NECK :  without JVD/Nodes/TM/ nl carotid upstrokes bilaterally   LUNGS: no acc muscle use,  Nl contour chest which is clear to A and P bilaterally without cough on insp or exp maneuvers   CV:  RRR  no s3 or murmur or increase in P2, and no edema   ABD:  Obese/ soft and nontender with nl inspiratory excursion in the  supine position. No bruits or organomegaly appreciated, bowel sounds nl  MS:  Nl gait/ ext warm without deformities, calf tenderness,  cyanosis or clubbing No obvious joint restrictions   SKIN: warm and dry without lesions    NEURO:  alert, approp, nl sensorium with  no motor or cerebellar deficits apparent.               Assessment & Plan:

## 2018-11-10 NOTE — Assessment & Plan Note (Signed)
Likely related to using high dose BREO   rec improve dpi technique, time BREO  with am teeth brushing and use arm and hammer baking soda based products/ may need to consider lower strength BREO if refractory  Thrush develops.    I had an extended discussion with the patient reviewing all relevant studies completed to date and  lasting 15 to 20 minutes of a 25 minute visit    See device teaching which extended face to face time for this visit.  Each maintenance medication was reviewed in detail including emphasizing most importantly the difference between maintenance and prns and under what circumstances the prns are to be triggered using an action plan format that is not reflected in the computer generated alphabetically organized AVS which I have not found useful in most complex patients, especially with respiratory illnesses  Please see AVS for specific instructions unique to this visit that I personally wrote and verbalized to the the pt in detail and then reviewed with pt  by my nurse highlighting any  changes in therapy recommended at today's visit to their plan of care.

## 2018-11-10 NOTE — Patient Instructions (Signed)
Plan A = Automatic = BREO 200 one each am       Plan B = Backup Only use your  combivent respimat  as a rescue medication to be used if you can't catch your breath by resting or doing a relaxed purse lip breathing pattern.  - The less you use it, the better it will work when you need it. - Ok to use the inhaler up to 1 puffs  every 4  hours if you must but call for appointment if use goes up over your usual need - Don't leave home without it !!  (think of it like the spare tire for your car)   Plan C = Crisis - only use your albuterol nebulizer if you first try Plan B and it fails to help > ok to use the nebulizer up to every 4 hours but if start needing it regularly call for immediate appointment   Please schedule a follow up visit in 12 months but call sooner if needed

## 2018-11-12 ENCOUNTER — Other Ambulatory Visit: Payer: Self-pay | Admitting: Primary Care

## 2018-11-18 ENCOUNTER — Other Ambulatory Visit: Payer: Self-pay | Admitting: Internal Medicine

## 2018-11-18 ENCOUNTER — Other Ambulatory Visit: Payer: Self-pay | Admitting: Primary Care

## 2018-11-23 ENCOUNTER — Other Ambulatory Visit: Payer: Self-pay | Admitting: Internal Medicine

## 2018-11-23 DIAGNOSIS — J449 Chronic obstructive pulmonary disease, unspecified: Secondary | ICD-10-CM

## 2018-11-30 ENCOUNTER — Other Ambulatory Visit: Payer: Self-pay | Admitting: Primary Care

## 2018-12-25 ENCOUNTER — Other Ambulatory Visit: Payer: Self-pay | Admitting: Primary Care

## 2018-12-25 DIAGNOSIS — M545 Low back pain: Secondary | ICD-10-CM | POA: Diagnosis not present

## 2018-12-25 DIAGNOSIS — E785 Hyperlipidemia, unspecified: Secondary | ICD-10-CM

## 2018-12-25 DIAGNOSIS — G894 Chronic pain syndrome: Secondary | ICD-10-CM | POA: Diagnosis not present

## 2018-12-25 DIAGNOSIS — M25562 Pain in left knee: Secondary | ICD-10-CM | POA: Diagnosis not present

## 2018-12-25 DIAGNOSIS — M25569 Pain in unspecified knee: Secondary | ICD-10-CM | POA: Diagnosis not present

## 2018-12-25 DIAGNOSIS — M542 Cervicalgia: Secondary | ICD-10-CM | POA: Diagnosis not present

## 2018-12-25 DIAGNOSIS — Z79891 Long term (current) use of opiate analgesic: Secondary | ICD-10-CM | POA: Diagnosis not present

## 2019-01-14 ENCOUNTER — Other Ambulatory Visit: Payer: Self-pay

## 2019-01-14 DIAGNOSIS — M25561 Pain in right knee: Principal | ICD-10-CM

## 2019-01-14 DIAGNOSIS — M25562 Pain in left knee: Principal | ICD-10-CM

## 2019-01-14 DIAGNOSIS — G8929 Other chronic pain: Secondary | ICD-10-CM

## 2019-01-14 DIAGNOSIS — G4700 Insomnia, unspecified: Secondary | ICD-10-CM

## 2019-01-14 NOTE — Telephone Encounter (Signed)
Ambien 10mg   Last prescribed on 10/19/2018 . Last office visit on 09/30/2018

## 2019-01-15 ENCOUNTER — Other Ambulatory Visit: Payer: Self-pay | Admitting: Primary Care

## 2019-01-15 DIAGNOSIS — G8929 Other chronic pain: Secondary | ICD-10-CM

## 2019-01-15 DIAGNOSIS — G4700 Insomnia, unspecified: Secondary | ICD-10-CM

## 2019-01-15 DIAGNOSIS — M25561 Pain in right knee: Principal | ICD-10-CM

## 2019-01-15 DIAGNOSIS — M25562 Pain in left knee: Principal | ICD-10-CM

## 2019-01-15 NOTE — Telephone Encounter (Signed)
Noted, refill sent to pharmacy. 

## 2019-01-15 NOTE — Telephone Encounter (Signed)
Name of Medication: Ambien Name of Pharmacy: [facility] Last Fill or Written Date and Quantity: 10/09/18, #90 Last Office Visit and Type: 09/30/18, f/u Next Office Visit and Type: 03/31/18, f/u Last Controlled Substance Agreement Date: none Last UDS: none

## 2019-01-21 ENCOUNTER — Other Ambulatory Visit: Payer: Self-pay | Admitting: Primary Care

## 2019-01-21 DIAGNOSIS — N3943 Post-void dribbling: Secondary | ICD-10-CM

## 2019-01-21 DIAGNOSIS — M25562 Pain in left knee: Secondary | ICD-10-CM | POA: Diagnosis not present

## 2019-01-21 DIAGNOSIS — G894 Chronic pain syndrome: Secondary | ICD-10-CM | POA: Diagnosis not present

## 2019-01-21 DIAGNOSIS — M545 Low back pain: Secondary | ICD-10-CM | POA: Diagnosis not present

## 2019-01-21 DIAGNOSIS — Z79891 Long term (current) use of opiate analgesic: Secondary | ICD-10-CM | POA: Diagnosis not present

## 2019-01-21 DIAGNOSIS — M542 Cervicalgia: Secondary | ICD-10-CM | POA: Diagnosis not present

## 2019-01-25 ENCOUNTER — Ambulatory Visit (HOSPITAL_COMMUNITY)
Admission: RE | Admit: 2019-01-25 | Discharge: 2019-01-25 | Disposition: A | Discharge status: Home / Self Care | Payer: Medicare Other | Source: Ambulatory Visit | Attending: Neurology | Admitting: Neurology

## 2019-01-25 ENCOUNTER — Other Ambulatory Visit (HOSPITAL_COMMUNITY): Payer: Self-pay | Admitting: Neurology

## 2019-01-25 DIAGNOSIS — M542 Cervicalgia: Secondary | ICD-10-CM | POA: Diagnosis not present

## 2019-02-15 ENCOUNTER — Other Ambulatory Visit: Payer: Self-pay | Admitting: Primary Care

## 2019-02-15 DIAGNOSIS — K219 Gastro-esophageal reflux disease without esophagitis: Secondary | ICD-10-CM

## 2019-02-18 DIAGNOSIS — Z79891 Long term (current) use of opiate analgesic: Secondary | ICD-10-CM | POA: Diagnosis not present

## 2019-02-18 DIAGNOSIS — M545 Low back pain: Secondary | ICD-10-CM | POA: Diagnosis not present

## 2019-02-18 DIAGNOSIS — M25562 Pain in left knee: Secondary | ICD-10-CM | POA: Diagnosis not present

## 2019-02-18 DIAGNOSIS — M542 Cervicalgia: Secondary | ICD-10-CM | POA: Diagnosis not present

## 2019-03-05 ENCOUNTER — Other Ambulatory Visit: Payer: Self-pay

## 2019-03-05 ENCOUNTER — Ambulatory Visit (INDEPENDENT_AMBULATORY_CARE_PROVIDER_SITE_OTHER): Payer: Medicare Other | Admitting: Primary Care

## 2019-03-05 DIAGNOSIS — E119 Type 2 diabetes mellitus without complications: Secondary | ICD-10-CM

## 2019-03-05 NOTE — Assessment & Plan Note (Signed)
Based off of his two glucose readings he may be uncontrolled. Discussed that we really need more readings to go from, he will start checking more regularly. He is due for A1C which is pending. He will come in for labs. Discussed to work on diet, start exercising.  Consider adding Trulicity if needed. Will switch glipizide back to BID dosing per his request once we receive labs.

## 2019-03-05 NOTE — Telephone Encounter (Signed)
Irving Burton, can you do a WebEx visit for today with him? Thanks!

## 2019-03-05 NOTE — Patient Instructions (Signed)
Continue your current medications as prescribed.  It is important that you improve your diet. Please limit carbohydrates in the form of white bread, rice, pasta, sweets, fast food, fried food, sugary drinks, etc. Increase your consumption of fresh fruits and vegetables, whole grains, lean protein.  Ensure you are consuming 64 ounces of water daily.  Start exercising. You should be getting 150 minutes of moderate intensity exercise weekly.  We will contact you for a lab only appointment. I'll be in touch with your results soon.  It was nice to see you! Mayra Reel, NP-C

## 2019-03-05 NOTE — Progress Notes (Signed)
Subjective:    Patient ID: Joseph [name], male    DOB: August 23, 1971, 48 y.o.   MRN: [id number]  HPI  Virtual Visit via Video Note  I connected with Joseph [name] on 03/05/19 at  2:20 PM EDT by a video enabled telemedicine application and verified that I am speaking with the correct person using two identifiers.   I discussed the limitations of evaluation and management by telemedicine and the availability of in person appointments. The patient expressed understanding and agreed to proceed. He is at home, I am in the office.  History of Present Illness:  Joseph [name] is a 48 year old male with a history of type 2 diabetes, COPD, OSA, Chronic pain, GERD, male hypogonadism who presents today via video with a chief complaint of hyperglycemia.  He is currently managed on Metformin ER 1000 mg tablets once daily, Jardiance 10 mg daily, Glipizide ER 10 mg daily. His last A1C was 7.1 in August 2019.   Today he endorses symptoms of feeling weaker and polydipsia. He's not checking his glucose regularly. He checked his glucose 2 hours after lunch and got a reading of 380 and 394 the following day. He denies dizziness, feeling shaky/jittery, polyuria, visual changes. Of note he felt better when taking glipizide 10 mg BID and would like to switch back.  Diet currently consists of:  Breakfast: Skips mostly Lunch: Meat, starch Dinner: Meat, starch, little vegetables  Snacks: Raw veggies, cole slaw Desserts: Daily  Beverages: Soda, no water  Exercise: He is not exercising      Observations/Objective:  Alert and oriented. No distress. Appears well.  Assessment and Plan:  Based off of his two glucose readings he may be uncontrolled. Discussed that we really need more readings to go from, he will start checking more regularly. He is due for A1C which is pending. He will come in for labs. Discussed to work on diet, start exercising.  Consider adding Trulicity if needed. Will switch glipizide  back to BID dosing per his request once we receive labs.  Follow Up Instructions:  Continue your current medications as prescribed.  It is important that you improve your diet. Please limit carbohydrates in the form of white bread, rice, pasta, sweets, fast food, fried food, sugary drinks, etc. Increase your consumption of fresh fruits and vegetables, whole grains, lean protein.  Ensure you are consuming 64 ounces of water daily.  Start exercising. You should be getting 150 minutes of moderate intensity exercise weekly.  We will contact you for a lab only appointment. I'll be in touch with your results soon.  It was nice to see you! Joseph Reel, NP-C    I discussed the assessment and treatment plan with the patient. The patient was provided an opportunity to ask questions and all were answered. The patient agreed with the plan and demonstrated an understanding of the instructions.   The patient was advised to call back or seek an in-person evaluation if the symptoms worsen or if the condition fails to improve as anticipated.     Joseph Nest, NP    Review of Systems  Constitutional: Negative for fever.  Eyes: Negative for visual disturbance.  Respiratory: Negative for shortness of breath.   Cardiovascular: Negative for chest pain.  Endocrine: Positive for polydipsia. Negative for polyphagia and polyuria.  Neurological: Negative for dizziness.       Past Medical History:  Diagnosis Date  . Anxiety   . Arthritis    oa left  knee and lower back  . Asthma   . Cataract    hx of bilateral  . Chronic back pain   . Chronic pain of left knee   . COPD (chronic obstructive pulmonary disease) (HCC)   . Diabetes mellitus    type 2  . GERD (gastroesophageal reflux disease)   . INGUINAL PAIN, LEFT 10/03/2009   Qualifier: Diagnosis of  By: Joseph Penna MD, Joseph [name]   . Overdose    03-24-18 accidental  . Shortness of breath    with heavy exertion  . Sleep apnea      Social  History   Socioeconomic History  . Marital status: Significant Other    Spouse name: Not on file  . Number of children: Not on file  . Years of education: Not on file  . Highest education level: Not on file  Occupational History  . Occupation: disability  Social Needs  . Financial resource strain: Not on file  . Food insecurity:    Worry: Not on file    Inability: Not on file  . Transportation needs:    Medical: Not on file    Non-medical: Not on file  Tobacco Use  . Smoking status: Current Every Day Smoker    Packs/day: 1.00    Years: 26.00    Pack years: 26.00    Types: Cigarettes    Start date: 12/17/1993  . Smokeless tobacco: Never Used  . Tobacco comment: 1ppd as of 11/06/17 ep  Substance and Sexual Activity  . Alcohol use: Never    Alcohol/week: 0.0 standard drinks    Frequency: Never  . Drug use: Never  . Sexual activity: Yes    Birth control/protection: Surgical  Lifestyle  . Physical activity:    Days per week: Not on file    Minutes per session: Not on file  . Stress: Not on file  Relationships  . Social connections:    Talks on phone: Not on file    Gets together: Not on file    Attends religious service: Not on file    Active member of club or organization: Not on file    Attends meetings of clubs or organizations: Not on file    Relationship status: Not on file  . Intimate partner violence:    Fear of current or ex partner: Not on file    Emotionally abused: Not on file    Physically abused: Not on file    Forced sexual activity: Not on file  Other Topics Concern  . Not on file  Social History Narrative   Recent visit to [facility] in [place] d/t increased pain where he was prescribed percocet 7.5-325 mg for pain.    Past Surgical History:  Procedure Laterality Date  . CARPAL TUNNEL RELEASE Left   . CATARACT EXTRACTION W/PHACO Right 03/11/2016   Procedure: CATARACT EXTRACTION PHACO AND INTRAOCULAR LENS PLACEMENT (IOC);  Surgeon: Joseph Payor, MD;   Location: [facility];  Service: Ophthalmology;  Laterality: Right;  CDE 4.24  . EYE SURGERY Right 2018   ioc with lens replacement  . HERNIA REPAIR Bilateral 2010   umbilical  . KNEE ARTHROSCOPY WITH MEDIAL MENISECTOMY Left 04/22/2014   Procedure: LEFT KNEE ARTHROSCOPY WITH MEDIAL MENISECTOMY;  Surgeon: Vickki Hearing, MD;  Location: [facility];  Service: Orthopedics;  Laterality: Left;  . KNEE ARTHROSCOPY WITH MEDIAL MENISECTOMY Right 12/06/2015   Procedure: RIGHT KNEE ARTHROSCOPY WITH MEDIAL MENISECTOMY;  Surgeon: Vickki Hearing, MD;  Location: [facility];  Service:  Orthopedics;  Laterality: Right;  . KNEE ARTHROSCOPY WITH MEDIAL MENISECTOMY Left 03/20/2017   Procedure: KNEE ARTHROSCOPY WITH MEDIAL MENISECTOMY;  Surgeon: Vickki Hearing, MD;  Location: [facility];  Service: Orthopedics;  Laterality: Left;  . ORIF WRIST FRACTURE Left 12/19/2016   Procedure: OPEN REDUCTION INTERNAL FIXATION (ORIF) WRIST FRACTURE;  Surgeon: Betha Loa, MD;  Location: [facility];  Service: Orthopedics;  Laterality: Left;  accumed   . PARTIAL KNEE ARTHROPLASTY Left 07/21/2018   Procedure: LEFT UNICOMPARTMENTAL KNEE;  Surgeon: Sheral Apley, MD;  Location: [facility];  Service: Orthopedics;  Laterality: Left;  . SHOULDER ARTHROCENTESIS Right 2008 or 2010  . VASECTOMY  2014    Family History  Problem Relation Age of Onset  . Emphysema Maternal Grandfather   . Emphysema Maternal Grandmother   . Clotting disorder Maternal Grandmother     Allergies  Allergen Reactions  . Azithromycin Hives  . Clarithromycin   . Erythromycin Hives  . Keflex [Cephalexin] Nausea Only  . Metformin Nausea And Vomiting  . Symbicort [Budesonide-Formoterol Fumarate] Other (See Comments)    States that 3 doses were used and breathing became worse    Current Outpatient Medications on File Prior to Visit  Medication Sig Dispense Refill  . albuterol (PROVENTIL) (2.5 MG/3ML) 0.083% nebulizer solution USE 1 VIAL VIA NEBULIZER  FOUR TIMES DAILY AS NEEDED FOR SHORTNESS OF BREATH 360 mL 2  . ANDROGEL PUMP 20.25 MG/ACT (1.62%) GEL Apply 2 Pump topically daily.     Marland Kitchen aspirin EC 81 MG tablet Take 1 tablet (81 mg total) by mouth 2 (two) times daily. For DVT prophylaxis 60 tablet 0  . atorvastatin (LIPITOR) 20 MG tablet TAKE ONE TABLET BY MOUTH IN THE EVENING. 90 tablet 1  . betamethasone dipropionate (DIPROLENE) 0.05 % cream Apply topically 2 (two) times daily as needed.     Marland Kitchen BREO ELLIPTA 200-25 MCG/INH AEPB INHALE ONE PUFF INTO THE LUNGS DAILY 60 each 11  . buPROPion (WELLBUTRIN SR) 150 MG 12 hr tablet TAKE ONE TABLET BY MOUTH TWICE A DAY 180 tablet 1  . celecoxib (CELEBREX) 200 MG capsule Take 1 capsule by mouth once daily as needed for pain. 90 capsule 1  . citalopram (CELEXA) 40 MG tablet TAKE ONE TABLET BY MOUTH ONCE DAILY 30 tablet 11  . diphenhydrAMINE (BENADRYL) 25 MG tablet Take 25 mg by mouth 2 (two) times daily.    Marland Kitchen docusate sodium (COLACE) 100 MG capsule Take 1 capsule (100 mg total) by mouth 2 (two) times daily. To prevent constipation while taking pain medication. 60 capsule 0  . gabapentin (NEURONTIN) 300 MG capsule Take 2 capsules (600 mg total) by mouth 2 (two) times daily. 180 capsule 3  . glipiZIDE (GLIPIZIDE XL) 10 MG 24 hr tablet Take 1 tablet (10 mg total) by mouth daily with breakfast. For diabetes. 90 tablet 3  . HYDROcodone-acetaminophen (NORCO/VICODIN) 5-325 MG tablet     . ipratropium (ATROVENT) 0.02 % nebulizer solution USE 1 VIAL VIA NEBULIZER FOUR TIMES DAILY AS NEEDED 300 mL 0  . Ipratropium-Albuterol (COMBIVENT) 20-100 MCG/ACT AERS respimat Inhale 1 puff into the lungs every 6 (six) hours as needed for wheezing. 1 Inhaler 2  . JARDIANCE 10 MG TABS tablet TAKE ONE TABLET BY MOUTH EVERY MORNING. 90 tablet 1  . lansoprazole (PREVACID) 30 MG capsule TAKE ONE CAPSULE BY MOUTH TWICE DAILY BEFORE A MEAL 180 capsule 1  . metFORMIN (GLUCOPHAGE-XR) 500 MG 24 hr tablet TAKE TWO TABLETS DAILY WITH  BREAKFAST  180 tablet 1  . methocarbamol (ROBAXIN) 500 MG tablet Take 1 tablet (500 mg total) by mouth every 8 (eight) hours as needed for muscle spasms. 40 tablet 0  . Multiple Vitamin (MULTIVITAMIN WITH MINERALS) TABS Take 1 tablet by mouth every morning. Men's once daily multivitamin packet (vitamin e, calcium, ginseng)    . ondansetron (ZOFRAN) 4 MG tablet Take 1 tablet (4 mg total) by mouth every 8 (eight) hours as needed for nausea or vomiting. 20 tablet 0  . Saw Palmetto, Serenoa repens, (SAW PALMETTO PO) Take 1 capsule by mouth daily.    . tamsulosin (FLOMAX) 0.4 MG CAPS capsule TAKE ONE CAPSULE BY MOUTH DAILY 90 capsule 1  . traZODone (DESYREL) 150 MG tablet TAKE ONE TABLET (150MG  TOTAL) BY MOUTH AT BEDTIME AS NEEDED FOR SLEEP 90 tablet 1  . Turmeric 500 MG CAPS Take 1 capsule by mouth daily.    Marland Kitchen zolpidem (AMBIEN) 10 MG tablet TAKE ONE TABLET (10MG  TOTAL) BY MOUTH ATBEDTIME AS NEEDED FOR SLEEP 90 tablet 0   No current facility-administered medications on file prior to visit.     There were no vitals taken for this visit.  ' Objective:   Physical Exam  Constitutional: He is oriented to person, place, and time. He appears well-nourished.  Respiratory: Effort normal.  Neurological: He is alert and oriented to person, place, and time.  Psychiatric: He has a normal mood and affect.           Assessment & Plan:

## 2019-03-08 ENCOUNTER — Other Ambulatory Visit (INDEPENDENT_AMBULATORY_CARE_PROVIDER_SITE_OTHER): Payer: Medicare Other

## 2019-03-08 ENCOUNTER — Other Ambulatory Visit: Payer: Self-pay

## 2019-03-08 ENCOUNTER — Other Ambulatory Visit: Payer: Self-pay | Admitting: Primary Care

## 2019-03-08 DIAGNOSIS — E785 Hyperlipidemia, unspecified: Secondary | ICD-10-CM

## 2019-03-08 DIAGNOSIS — E119 Type 2 diabetes mellitus without complications: Secondary | ICD-10-CM

## 2019-03-08 LAB — COMPREHENSIVE METABOLIC PANEL
ALT: 20 U/L (ref 0–53)
AST: 21 U/L (ref 0–37)
Albumin: 4.6 g/dL (ref 3.5–5.2)
Alkaline Phosphatase: 109 U/L (ref 39–117)
BUN: 13 mg/dL (ref 6–23)
CO2: 24 mEq/L (ref 19–32)
Calcium: 9.7 mg/dL (ref 8.4–10.5)
Chloride: 98 mEq/L (ref 96–112)
Creatinine, Ser: 1.01 mg/dL (ref 0.40–1.50)
GFR: 78.56 mL/min (ref >60.00)
Glucose, Bld: 402 mg/dL — ABNORMAL HIGH (ref 70–99)
Potassium: 4 mEq/L (ref 3.5–5.1)
Sodium: 134 mEq/L — ABNORMAL LOW (ref 135–145)
Total Bilirubin: 0.5 mg/dL (ref 0.2–1.2)
Total Protein: 7.3 g/dL (ref 6.0–8.3)

## 2019-03-08 LAB — HEMOGLOBIN A1C: Hgb A1c MFr Bld: 9.6 % — ABNORMAL HIGH (ref 4.6–6.5)

## 2019-03-08 LAB — LIPID PANEL
Cholesterol: 172 mg/dL (ref 0–200)
HDL: 32.3 mg/dL — ABNORMAL LOW (ref >39.00)
NonHDL: 139.65
Total CHOL/HDL Ratio: 5
Triglycerides: 359 mg/dL — ABNORMAL HIGH (ref 0.0–149.0)
VLDL: 71.8 mg/dL — ABNORMAL HIGH (ref 0.0–40.0)

## 2019-03-08 LAB — LDL CHOLESTEROL, DIRECT: Direct LDL: 103 mg/dL

## 2019-03-09 ENCOUNTER — Other Ambulatory Visit: Payer: Self-pay | Admitting: Primary Care

## 2019-03-09 DIAGNOSIS — E119 Type 2 diabetes mellitus without complications: Secondary | ICD-10-CM

## 2019-03-09 MED ORDER — DULAGLUTIDE 0.75 MG/0.5ML ~~LOC~~ SOAJ: 0.7500 mg | SUBCUTANEOUS | 2 refills | Status: DC

## 2019-03-09 MED ORDER — GLIPIZIDE 10 MG PO TABS: 10.0000 mg | ORAL_TABLET | Freq: Two times a day (BID) | ORAL | 3 refills | Status: DC

## 2019-03-11 ENCOUNTER — Other Ambulatory Visit: Payer: Self-pay | Admitting: Primary Care

## 2019-03-11 DIAGNOSIS — E119 Type 2 diabetes mellitus without complications: Secondary | ICD-10-CM

## 2019-03-11 MED ORDER — EMPAGLIFLOZIN 10 MG PO TABS: 10.0000 mg | ORAL_TABLET | Freq: Every morning | ORAL | 3 refills | Status: DC

## 2019-03-11 NOTE — Telephone Encounter (Signed)
Received faxed refill request for Jardiance 10mg   Last prescribed on 09/01/2018 with 1 refill Last seen on 03/05/2019. Next future appointment on 04/01/2019

## 2019-03-11 NOTE — Telephone Encounter (Signed)
Noted, refill sent to pharmacy. 

## 2019-03-24 ENCOUNTER — Other Ambulatory Visit: Payer: Self-pay | Admitting: Primary Care

## 2019-03-24 NOTE — Telephone Encounter (Signed)
I believe patient sent a message through MyChart bout this.  Rx have not been prescribed. Last office visit on 03/05/2019. Next future appointment on 04/01/2019

## 2019-03-31 ENCOUNTER — Other Ambulatory Visit: Payer: Self-pay | Admitting: Primary Care

## 2019-04-01 ENCOUNTER — Ambulatory Visit (INDEPENDENT_AMBULATORY_CARE_PROVIDER_SITE_OTHER): Payer: Medicare Other | Admitting: Primary Care

## 2019-04-01 ENCOUNTER — Encounter: Payer: Self-pay | Admitting: Primary Care

## 2019-04-01 DIAGNOSIS — E119 Type 2 diabetes mellitus without complications: Secondary | ICD-10-CM | POA: Diagnosis not present

## 2019-04-01 NOTE — Patient Instructions (Signed)
Continue Trulicity 0.75 mg weekly for diabetes. Continue your Jardiance, Metformin, and Glipizide.  Start checking your blood sugars more regularly. The best time to check is before any meal, 2 hours after any meal, bedtime.  Please message me in one month if you continue to see readings at or above 170 consistently.  We will be in touch regarding a follow up visit for early July 2020.  It was a pleasure to see you today!

## 2019-04-01 NOTE — Progress Notes (Signed)
Subjective:    Patient ID: Joseph ForthJerry R Filip, male    DOB: 1971/08/14, 48 y.o.   MRN: [id number]  HPI  Virtual Visit via Video Note  I connected with Joseph [name] on 04/01/19 at 11:40 AM EDT by a video enabled telemedicine application and verified that I am speaking with the correct person using two identifiers.  Location: Patient: Home Provider: Office   I discussed the limitations of evaluation and management by telemedicine and the availability of in person appointments. The patient expressed understanding and agreed to proceed.  History of Present Illness:  Joseph [name] is a 48 year old male who presents today for follow up of diabetes.  Current medications include: Jardiance 10 mg, Glipizide 10 mg BID, Metformin ER 1000 mg daily, Trulicity 0.75 mg weekly. He has noticed increased gas since starting Trulicity, overall tolerable.   He is checking his blood glucose 1 times every other day and is getting readings of:  After breakfast, does not always wait 2 hours: 180's, 158, 200-250 range  Last A1C: 9.6 in April 2020 Last Eye Exam: No recent exam. Last Foot Exam:  Due in October 2020 Pneumonia Vaccination: Completed in 2019 ACE/ARB: None. Urine microalbumin negative in June 2019 Statin: atorvastatin   Diet currently consists of:  Breakfast: Boost Lunch: Skips sometimes, fast food occasionally  Dinner: Meat, vegetable, starch  Snacks: Infrequently Desserts: 2 times weekly  Beverages: Boost shakes, sometimes water, Coke  Exercise: He is not exercising.      Observations/Objective:  Appears well. Alert and oriented. No distress.  Assessment and Plan:  See problem based charting  Follow Up Instructions:  Continue Trulicity 0.75 mg weekly for diabetes. Continue your Jardiance, Metformin, and Glipizide.  Start checking your blood sugars more regularly. The best time to check is before any meal, 2 hours after any meal, bedtime.  Please message me in one month if  you continue to see readings at or above 170 consistently.  We will be in touch regarding a follow up visit for early July 2020.  It was a pleasure to see you today!    I discussed the assessment and treatment plan with the patient. The patient was provided an opportunity to ask questions and all were answered. The patient agreed with the plan and demonstrated an understanding of the instructions.   The patient was advised to call back or seek an in-person evaluation if the symptoms worsen or if the condition fails to improve as anticipated.     Doreene NestKatherine K Norina Cowper, NP    Review of Systems  Eyes: Negative for visual disturbance.  Respiratory: Negative for shortness of breath.   Cardiovascular: Negative for chest pain.  Neurological: Negative for dizziness.       Past Medical History:  Diagnosis Date  . Anxiety   . Arthritis    oa left knee and lower back  . Asthma   . Cataract    hx of bilateral  . Chronic back pain   . Chronic pain of left knee   . COPD (chronic obstructive pulmonary disease) (HCC)   . Diabetes mellitus    type 2  . GERD (gastroesophageal reflux disease)   . INGUINAL PAIN, LEFT 10/03/2009   Qualifier: Diagnosis of  By: Darrick PennaFields MD, Sandi L   . Overdose    03-24-18 accidental  . Shortness of breath    with heavy exertion  . Sleep apnea      Social History   Socioeconomic History  .  Marital status: Significant Other    Spouse name: Not on file  . Number of children: Not on file  . Years of education: Not on file  . Highest education level: Not on file  Occupational History  . Occupation: disability  Social Needs  . Financial resource strain: Not on file  . Food insecurity:    Worry: Not on file    Inability: Not on file  . Transportation needs:    Medical: Not on file    Non-medical: Not on file  Tobacco Use  . Smoking status: Current Every Day Smoker    Packs/day: 1.00    Years: 26.00    Pack years: 26.00    Types: Cigarettes     Start date: 12/17/1993  . Smokeless tobacco: Never Used  . Tobacco comment: 1ppd as of 11/06/17 ep  Substance and Sexual Activity  . Alcohol use: Never    Alcohol/week: 0.0 standard drinks    Frequency: Never  . Drug use: Never  . Sexual activity: Yes    Birth control/protection: Surgical  Lifestyle  . Physical activity:    Days per week: Not on file    Minutes per session: Not on file  . Stress: Not on file  Relationships  . Social connections:    Talks on phone: Not on file    Gets together: Not on file    Attends religious service: Not on file    Active member of club or organization: Not on file    Attends meetings of clubs or organizations: Not on file    Relationship status: Not on file  . Intimate partner violence:    Fear of current or ex partner: Not on file    Emotionally abused: Not on file    Physically abused: Not on file    Forced sexual activity: Not on file  Other Topics Concern  . Not on file  Social History Narrative   Recent visit to [facility] in [place] d/t increased pain where he was prescribed percocet 7.5-325 mg for pain.    Past Surgical History:  Procedure Laterality Date  . CARPAL TUNNEL RELEASE Left   . CATARACT EXTRACTION W/PHACO Right 03/11/2016   Procedure: CATARACT EXTRACTION PHACO AND INTRAOCULAR LENS PLACEMENT (IOC);  Surgeon: Gemma Payor, MD;  Location: [facility];  Service: Ophthalmology;  Laterality: Right;  CDE 4.24  . EYE SURGERY Right 2018   ioc with lens replacement  . HERNIA REPAIR Bilateral 2010   umbilical  . KNEE ARTHROSCOPY WITH MEDIAL MENISECTOMY Left 04/22/2014   Procedure: LEFT KNEE ARTHROSCOPY WITH MEDIAL MENISECTOMY;  Surgeon: Vickki Hearing, MD;  Location: [facility];  Service: Orthopedics;  Laterality: Left;  . KNEE ARTHROSCOPY WITH MEDIAL MENISECTOMY Right 12/06/2015   Procedure: RIGHT KNEE ARTHROSCOPY WITH MEDIAL MENISECTOMY;  Surgeon: Vickki Hearing, MD;  Location: [facility];  Service: Orthopedics;  Laterality: Right;  . KNEE  ARTHROSCOPY WITH MEDIAL MENISECTOMY Left 03/20/2017   Procedure: KNEE ARTHROSCOPY WITH MEDIAL MENISECTOMY;  Surgeon: Vickki Hearing, MD;  Location: [facility];  Service: Orthopedics;  Laterality: Left;  . ORIF WRIST FRACTURE Left 12/19/2016   Procedure: OPEN REDUCTION INTERNAL FIXATION (ORIF) WRIST FRACTURE;  Surgeon: Betha Loa, MD;  [facility];  Service: Orthopedics;  Laterality: Left;  accumed   . PARTIAL KNEE ARTHROPLASTY Left 07/21/2018   Procedure: LEFT UNICOMPARTMENTAL KNEE;  Surgeon: Sheral Apley, MD;  Location: [facility];  Service: Orthopedics;  Laterality: Left;  . SHOULDER ARTHROCENTESIS Right 2008 or  2010  . VASECTOMY  2014    Family History  Problem Relation Age of Onset  . Emphysema Maternal Grandfather   . Emphysema Maternal Grandmother   . Clotting disorder Maternal Grandmother     Allergies  Allergen Reactions  . Azithromycin Hives  . Clarithromycin   . Erythromycin Hives  . Keflex [Cephalexin] Nausea Only  . Metformin Nausea And Vomiting  . Symbicort [Budesonide-Formoterol Fumarate] Other (See Comments)    States that 3 doses were used and breathing became worse    Current Outpatient Medications on File Prior to Visit  Medication Sig Dispense Refill  . albuterol (PROVENTIL) (2.5 MG/3ML) 0.083% nebulizer solution USE 1 VIAL VIA NEBULIZER FOUR TIMES DAILY AS NEEDED FOR SHORTNESS OF BREATH 360 mL 2  . ANDROGEL PUMP 20.25 MG/ACT (1.62%) GEL Apply 2 Pump topically daily.     Marland Kitchen aspirin EC 81 MG tablet Take 1 tablet (81 mg total) by mouth 2 (two) times daily. For DVT prophylaxis 60 tablet 0  . atorvastatin (LIPITOR) 20 MG tablet TAKE ONE TABLET BY MOUTH IN THE EVENING. 90 tablet 1  . betamethasone dipropionate (DIPROLENE) 0.05 % cream Apply topically 2 (two) times daily as needed.     Marland Kitchen BREO ELLIPTA 200-25 MCG/INH AEPB INHALE ONE PUFF INTO THE LUNGS DAILY 60 each 11  . buPROPion (WELLBUTRIN SR) 150 MG 12 hr tablet TAKE ONE TABLET BY MOUTH TWICE  A DAY 180 tablet 1  . celecoxib (CELEBREX) 200 MG capsule Take 1 capsule by mouth once daily as needed for pain. 90 capsule 1  . citalopram (CELEXA) 40 MG tablet TAKE ONE TABLET BY MOUTH ONCE DAILY 30 tablet 11  . diphenhydrAMINE (BENADRYL) 25 MG tablet Take 25 mg by mouth 2 (two) times daily.    Marland Kitchen docusate sodium (COLACE) 100 MG capsule Take 1 capsule (100 mg total) by mouth 2 (two) times daily. To prevent constipation while taking pain medication. 60 capsule 0  . Dulaglutide (TRULICITY) 0.75 MG/0.5ML SOPN Inject 0.75 mg into the skin once a week. For diabetes. 2 mL 2  . empagliflozin (JARDIANCE) 10 MG TABS tablet Take 10 mg by mouth every morning. For diabetes. 90 tablet 3  . gabapentin (NEURONTIN) 300 MG capsule Take 2 capsules (600 mg total) by mouth 2 (two) times daily. 180 capsule 3  . glipiZIDE (GLUCOTROL) 10 MG tablet Take 1 tablet (10 mg total) by mouth 2 (two) times daily. For diabetes. 180 tablet 3  . HYDROcodone-acetaminophen (NORCO/VICODIN) 5-325 MG tablet     . ipratropium (ATROVENT) 0.02 % nebulizer solution USE 1 VIAL VIA NEBULIZER FOUR TIMES DAILY AS NEEDED 300 mL 0  . Ipratropium-Albuterol (COMBIVENT) 20-100 MCG/ACT AERS respimat Inhale 1 puff into the lungs every 6 (six) hours as needed for wheezing. 1 Inhaler 2  . lansoprazole (PREVACID) 30 MG capsule TAKE ONE CAPSULE BY MOUTH TWICE DAILY BEFORE A MEAL 180 capsule 1  . metFORMIN (GLUCOPHAGE-XR) 500 MG 24 hr tablet TAKE TWO TABLETS DAILY WITH BREAKFAST 180 tablet 1  . methocarbamol (ROBAXIN) 500 MG tablet Take 1 tablet (500 mg total) by mouth every 8 (eight) hours as needed for muscle spasms. 40 tablet 0  . Multiple Vitamin (MULTIVITAMIN WITH MINERALS) TABS Take 1 tablet by mouth every morning. Men's once daily multivitamin packet (vitamin e, calcium, ginseng)    . ondansetron (ZOFRAN) 4 MG tablet Take 1 tablet (4 mg total) by mouth every 8 (eight) hours as needed for nausea or vomiting. 20 tablet 0  . Saw Palmetto, Serenoa  repens, (SAW PALMETTO PO) Take 1 capsule by mouth daily.    . tamsulosin (FLOMAX) 0.4 MG CAPS capsule TAKE ONE CAPSULE BY MOUTH DAILY 90 capsule 1  . traZODone (DESYREL) 150 MG tablet TAKE ONE TABLET (150MG  TOTAL) BY MOUTH AT BEDTIME AS NEEDED FOR SLEEP 90 tablet 1  . Turmeric 500 MG CAPS Take 1 capsule by mouth daily.    Marland Kitchen zolpidem (AMBIEN) 10 MG tablet TAKE ONE TABLET (10MG  TOTAL) BY MOUTH ATBEDTIME AS NEEDED FOR SLEEP 90 tablet 0   No current facility-administered medications on file prior to visit.     There were no vitals taken for this visit.   Objective:   Physical Exam  Constitutional: He is oriented to person, place, and time. He appears well-nourished.  Respiratory: Effort normal. No respiratory distress.  Neurological: He is alert and oriented to person, place, and time.  Psychiatric: He has a normal mood and affect.           Assessment & Plan:

## 2019-04-01 NOTE — Assessment & Plan Note (Signed)
Glucose readings improved with Trulicity 0.75 mg weekly, continue same. Also continue other oral meds. Discussed to work on diet and start exercising.  He will notify me if he continues to see readings at or above 170 after one month.   Follow up in person in 3 months for repeat A1C.

## 2019-04-14 ENCOUNTER — Other Ambulatory Visit: Payer: Self-pay | Admitting: Primary Care

## 2019-04-14 DIAGNOSIS — G8929 Other chronic pain: Secondary | ICD-10-CM

## 2019-04-14 DIAGNOSIS — G4700 Insomnia, unspecified: Secondary | ICD-10-CM

## 2019-04-14 NOTE — Telephone Encounter (Signed)
Last prescribed on 01/15/2019 #90 with 0 refills. Last office visit on 04/01/2019. Next future appointment on 05/28/2019

## 2019-04-14 NOTE — Telephone Encounter (Signed)
Does he still feel like he needs the Ambien? It's actually dangerous to take with the narcotics. We originally prescribed this for sleep due to his chronic knee pain, now that he's back on treatment does he need this any longer?

## 2019-04-15 DIAGNOSIS — M25562 Pain in left knee: Secondary | ICD-10-CM | POA: Diagnosis not present

## 2019-04-15 DIAGNOSIS — M545 Low back pain: Secondary | ICD-10-CM | POA: Diagnosis not present

## 2019-04-15 DIAGNOSIS — Z79891 Long term (current) use of opiate analgesic: Secondary | ICD-10-CM | POA: Diagnosis not present

## 2019-04-15 DIAGNOSIS — M542 Cervicalgia: Secondary | ICD-10-CM | POA: Diagnosis not present

## 2019-04-16 ENCOUNTER — Other Ambulatory Visit: Payer: Self-pay

## 2019-04-16 DIAGNOSIS — G4700 Insomnia, unspecified: Secondary | ICD-10-CM

## 2019-04-16 DIAGNOSIS — G8929 Other chronic pain: Secondary | ICD-10-CM

## 2019-04-16 MED ORDER — ZOLPIDEM TARTRATE 10 MG PO TABS: ORAL_TABLET | ORAL | 0 refills | Status: DC

## 2019-04-16 NOTE — Telephone Encounter (Signed)
Please notify patient that we originally prescribed Ambien to help with sleep due to his knee pain, now that he's back on his pain medications is he actually needing this? He's on a lot of medications that really shouldn't be taken together, especially pain medication and the Ambien.

## 2019-04-16 NOTE — Telephone Encounter (Signed)
Spoken and notified patient of Joseph [name] comments. Patient stated that he taking less pain medication and using the Ambien to help him sleeping. He is working pain management to try to use less but patient would like a refill of Ambien.

## 2019-04-16 NOTE — Telephone Encounter (Signed)
Name of Medication:zolpidem 10 mg  Name of Pharmacy:[facility]  Last Fill or Written Date and Quantity: # 90 on 01/15/19 Last Office Visit and Type: FU on 04/01/19 Next Office Visit and Type: 05/28/19 CPX

## 2019-04-16 NOTE — Telephone Encounter (Signed)
Noted, refill sent to pharmacy. 

## 2019-04-19 ENCOUNTER — Other Ambulatory Visit: Payer: Self-pay | Admitting: Primary Care

## 2019-04-19 DIAGNOSIS — M25562 Pain in left knee: Secondary | ICD-10-CM

## 2019-04-19 DIAGNOSIS — G8929 Other chronic pain: Secondary | ICD-10-CM

## 2019-04-21 NOTE — Telephone Encounter (Signed)
Noted, refill sent to pharmacy. Will discuss this at his upcoming office visit.

## 2019-04-21 NOTE — Telephone Encounter (Signed)
Last prescribed on 11/03/2018 . Last office visit on 04/01/2019 . Next future appointment on 05/28/2019

## 2019-05-15 ENCOUNTER — Other Ambulatory Visit: Payer: Self-pay | Admitting: Primary Care

## 2019-05-17 ENCOUNTER — Other Ambulatory Visit: Payer: Self-pay | Admitting: Primary Care

## 2019-05-17 DIAGNOSIS — E785 Hyperlipidemia, unspecified: Secondary | ICD-10-CM

## 2019-05-17 DIAGNOSIS — E119 Type 2 diabetes mellitus without complications: Secondary | ICD-10-CM

## 2019-05-19 ENCOUNTER — Telehealth: Payer: Self-pay

## 2019-05-19 NOTE — Telephone Encounter (Signed)
Left message to call clinic, needs COVID screen and back door lab info   

## 2019-05-21 ENCOUNTER — Other Ambulatory Visit (INDEPENDENT_AMBULATORY_CARE_PROVIDER_SITE_OTHER): Payer: Medicare Other

## 2019-05-21 DIAGNOSIS — E119 Type 2 diabetes mellitus without complications: Secondary | ICD-10-CM

## 2019-05-21 DIAGNOSIS — E785 Hyperlipidemia, unspecified: Secondary | ICD-10-CM

## 2019-05-21 LAB — LIPID PANEL
Cholesterol: 141 mg/dL (ref 0–200)
HDL: 29 mg/dL — ABNORMAL LOW (ref >39.00)
NonHDL: 111.75
Total CHOL/HDL Ratio: 5
Triglycerides: 383 mg/dL — ABNORMAL HIGH (ref 0.0–149.0)
VLDL: 76.6 mg/dL — ABNORMAL HIGH (ref 0.0–40.0)

## 2019-05-21 LAB — MICROALBUMIN / CREATININE URINE RATIO
Creatinine,U: 30.2 mg/dL
Microalb Creat Ratio: 2.3 mg/g (ref 0.0–30.0)
Microalb, Ur: <0.7 mg/dL (ref 0.0–1.9)

## 2019-05-21 LAB — HEMOGLOBIN A1C: Hgb A1c MFr Bld: 9.9 % — ABNORMAL HIGH (ref 4.6–6.5)

## 2019-05-21 LAB — LDL CHOLESTEROL, DIRECT: Direct LDL: 87 mg/dL

## 2019-05-26 ENCOUNTER — Other Ambulatory Visit: Payer: Self-pay | Admitting: Primary Care

## 2019-05-26 DIAGNOSIS — G8929 Other chronic pain: Secondary | ICD-10-CM

## 2019-05-26 DIAGNOSIS — M25561 Pain in right knee: Secondary | ICD-10-CM

## 2019-05-26 DIAGNOSIS — G894 Chronic pain syndrome: Secondary | ICD-10-CM

## 2019-05-26 DIAGNOSIS — M25562 Pain in left knee: Secondary | ICD-10-CM

## 2019-05-27 MED ORDER — GABAPENTIN 300 MG PO CAPS: 600.0000 mg | ORAL_CAPSULE | Freq: Two times a day (BID) | ORAL | 0 refills | Status: DC

## 2019-05-28 ENCOUNTER — Ambulatory Visit: Payer: Medicare Other

## 2019-05-28 ENCOUNTER — Encounter: Payer: Medicare Other | Admitting: Primary Care

## 2019-06-10 DIAGNOSIS — M25562 Pain in left knee: Secondary | ICD-10-CM | POA: Diagnosis not present

## 2019-06-10 DIAGNOSIS — M79601 Pain in right arm: Secondary | ICD-10-CM | POA: Diagnosis not present

## 2019-06-10 DIAGNOSIS — Z79891 Long term (current) use of opiate analgesic: Secondary | ICD-10-CM | POA: Diagnosis not present

## 2019-06-10 DIAGNOSIS — M545 Low back pain: Secondary | ICD-10-CM | POA: Diagnosis not present

## 2019-06-10 DIAGNOSIS — M542 Cervicalgia: Secondary | ICD-10-CM | POA: Diagnosis not present

## 2019-06-11 ENCOUNTER — Other Ambulatory Visit (HOSPITAL_COMMUNITY): Payer: Self-pay | Admitting: Neurology

## 2019-06-11 ENCOUNTER — Other Ambulatory Visit: Payer: Self-pay | Admitting: Neurology

## 2019-06-11 DIAGNOSIS — M5412 Radiculopathy, cervical region: Secondary | ICD-10-CM

## 2019-06-15 ENCOUNTER — Other Ambulatory Visit: Payer: Self-pay | Admitting: Primary Care

## 2019-06-15 DIAGNOSIS — E119 Type 2 diabetes mellitus without complications: Secondary | ICD-10-CM

## 2019-06-22 ENCOUNTER — Encounter: Payer: Medicare Other | Admitting: Primary Care

## 2019-06-23 ENCOUNTER — Ambulatory Visit (HOSPITAL_COMMUNITY)
Admission: RE | Admit: 2019-06-23 | Discharge: 2019-06-23 | Disposition: A | Discharge status: Home / Self Care | Payer: Medicare Other | Source: Ambulatory Visit | Attending: Neurology | Admitting: Neurology

## 2019-06-23 ENCOUNTER — Other Ambulatory Visit: Payer: Self-pay

## 2019-06-23 DIAGNOSIS — M542 Cervicalgia: Secondary | ICD-10-CM | POA: Diagnosis not present

## 2019-06-23 DIAGNOSIS — M5412 Radiculopathy, cervical region: Secondary | ICD-10-CM | POA: Diagnosis not present

## 2019-06-28 ENCOUNTER — Ambulatory Visit (INDEPENDENT_AMBULATORY_CARE_PROVIDER_SITE_OTHER): Payer: Medicare Other | Admitting: Primary Care

## 2019-06-28 ENCOUNTER — Encounter: Payer: Self-pay | Admitting: Primary Care

## 2019-06-28 ENCOUNTER — Other Ambulatory Visit: Payer: Self-pay

## 2019-06-28 VITALS — BP 120/82 | HR 102 | Temp 97.8°F | Ht 70.0 in | Wt 250.2 lb

## 2019-06-28 DIAGNOSIS — G4700 Insomnia, unspecified: Secondary | ICD-10-CM | POA: Diagnosis not present

## 2019-06-28 DIAGNOSIS — M1712 Unilateral primary osteoarthritis, left knee: Secondary | ICD-10-CM | POA: Diagnosis not present

## 2019-06-28 DIAGNOSIS — E785 Hyperlipidemia, unspecified: Secondary | ICD-10-CM

## 2019-06-28 DIAGNOSIS — F17210 Nicotine dependence, cigarettes, uncomplicated: Secondary | ICD-10-CM

## 2019-06-28 DIAGNOSIS — N3943 Post-void dribbling: Secondary | ICD-10-CM

## 2019-06-28 DIAGNOSIS — K5903 Drug induced constipation: Secondary | ICD-10-CM

## 2019-06-28 DIAGNOSIS — J45909 Unspecified asthma, uncomplicated: Secondary | ICD-10-CM

## 2019-06-28 DIAGNOSIS — G894 Chronic pain syndrome: Secondary | ICD-10-CM

## 2019-06-28 DIAGNOSIS — E119 Type 2 diabetes mellitus without complications: Secondary | ICD-10-CM

## 2019-06-28 DIAGNOSIS — T402X5A Adverse effect of other opioids, initial encounter: Secondary | ICD-10-CM

## 2019-06-28 DIAGNOSIS — E291 Testicular hypofunction: Secondary | ICD-10-CM | POA: Diagnosis not present

## 2019-06-28 DIAGNOSIS — K5909 Other constipation: Secondary | ICD-10-CM | POA: Diagnosis not present

## 2019-06-28 DIAGNOSIS — E349 Endocrine disorder, unspecified: Secondary | ICD-10-CM

## 2019-06-28 DIAGNOSIS — G8929 Other chronic pain: Secondary | ICD-10-CM

## 2019-06-28 DIAGNOSIS — Z0000 Encounter for general adult medical examination without abnormal findings: Secondary | ICD-10-CM | POA: Insufficient documentation

## 2019-06-28 DIAGNOSIS — G4733 Obstructive sleep apnea (adult) (pediatric): Secondary | ICD-10-CM

## 2019-06-28 DIAGNOSIS — J449 Chronic obstructive pulmonary disease, unspecified: Secondary | ICD-10-CM | POA: Diagnosis not present

## 2019-06-28 DIAGNOSIS — F419 Anxiety disorder, unspecified: Secondary | ICD-10-CM | POA: Diagnosis not present

## 2019-06-28 DIAGNOSIS — M25561 Pain in right knee: Secondary | ICD-10-CM

## 2019-06-28 DIAGNOSIS — F329 Major depressive disorder, single episode, unspecified: Secondary | ICD-10-CM

## 2019-06-28 DIAGNOSIS — K219 Gastro-esophageal reflux disease without esophagitis: Secondary | ICD-10-CM

## 2019-06-28 DIAGNOSIS — M25562 Pain in left knee: Secondary | ICD-10-CM

## 2019-06-28 MED ORDER — ATORVASTATIN CALCIUM 40 MG PO TABS: 40.0000 mg | ORAL_TABLET | Freq: Every day | ORAL | 3 refills | Status: DC

## 2019-06-28 MED ORDER — CHANTIX STARTING MONTH PAK 0.5 MG X 11 & 1 MG X 42 PO TABS: ORAL_TABLET | ORAL | 0 refills | Status: DC

## 2019-06-28 MED ORDER — BLOOD GLUCOSE MONITOR KIT: PACK | 0 refills | Status: DC

## 2019-06-28 MED ORDER — VARENICLINE TARTRATE 1 MG PO TABS: 1.0000 mg | ORAL_TABLET | Freq: Two times a day (BID) | ORAL | 0 refills | Status: DC

## 2019-06-28 NOTE — Progress Notes (Signed)
Patient ID: Joseph [name], male   DOB: 1971-02-15, 48 y.o.   MRN: [id number]  HPI: Joseph [name] is a 48 year old male who presents today for MWV and follow up of chronic medical conditions.   Past Medical History:  Diagnosis Date  . Anxiety   . Arthritis    oa left knee and lower back  . Asthma   . Cataract    hx of bilateral  . Chronic back pain   . Chronic pain of left knee   . COPD (chronic obstructive pulmonary disease) (HCC)   . Diabetes mellitus    type 2  . GERD (gastroesophageal reflux disease)   . INGUINAL PAIN, LEFT 10/03/2009   Qualifier: Diagnosis of  By: Darrick PennaFields MD, Sandi L   . Overdose    03-24-18 accidental  . Shortness of breath    with heavy exertion  . Sleep apnea     Current Outpatient Medications  Medication Sig Dispense Refill  . albuterol (PROVENTIL) (2.5 MG/3ML) 0.083% nebulizer solution USE 1 VIAL VIA NEBULIZER FOUR TIMES DAILY AS NEEDED FOR SHORTNESS OF BREATH 360 mL 2  . ANDROGEL PUMP 20.25 MG/ACT (1.62%) GEL Apply 2 Pump topically daily.     Marland Kitchen. aspirin EC 81 MG tablet Take 1 tablet (81 mg total) by mouth 2 (two) times daily. For DVT prophylaxis 60 tablet 0  . atorvastatin (LIPITOR) 20 MG tablet TAKE ONE TABLET BY MOUTH IN THE EVENING. 90 tablet 1  . betamethasone dipropionate (DIPROLENE) 0.05 % cream Apply topically 2 (two) times daily as needed.     Marland Kitchen. BREO ELLIPTA 200-25 MCG/INH AEPB INHALE ONE PUFF INTO THE LUNGS DAILY 60 each 11  . buPROPion (WELLBUTRIN SR) 150 MG 12 hr tablet TAKE ONE TABLET BY MOUTH TWICE A DAY 180 tablet 1  . celecoxib (CELEBREX) 200 MG capsule TAKE ONE CAPSULE BY MOUTH ONCE DAILY AS NEEDED for pain. 90 capsule 0  . citalopram (CELEXA) 40 MG tablet TAKE ONE TABLET BY MOUTH ONCE DAILY 30 tablet 11  . diphenhydrAMINE (BENADRYL) 25 MG tablet Take 25 mg by mouth 2 (two) times daily.    Marland Kitchen. docusate sodium (COLACE) 100 MG capsule Take 1 capsule (100 mg total) by mouth 2 (two) times daily. To prevent constipation while taking pain medication. 60  capsule 0  . empagliflozin (JARDIANCE) 10 MG TABS tablet Take 10 mg by mouth every morning. For diabetes. 90 tablet 3  . gabapentin (NEURONTIN) 300 MG capsule Take 2 capsules (600 mg total) by mouth 2 (two) times daily. 360 capsule 0  . glipiZIDE (GLUCOTROL) 10 MG tablet Take 1 tablet (10 mg total) by mouth 2 (two) times daily. For diabetes. 180 tablet 3  . HYDROcodone-acetaminophen (NORCO/VICODIN) 5-325 MG tablet     . ipratropium (ATROVENT) 0.02 % nebulizer solution USE 1 VIAL VIA NEBULIZER FOUR TIMES DAILY AS NEEDED 300 mL 0  . Ipratropium-Albuterol (COMBIVENT) 20-100 MCG/ACT AERS respimat Inhale 1 puff into the lungs every 6 (six) hours as needed for wheezing. 1 Inhaler 2  . lansoprazole (PREVACID) 30 MG capsule TAKE ONE CAPSULE BY MOUTH TWICE DAILY BEFORE A MEAL 180 capsule 1  . metFORMIN (GLUCOPHAGE-XR) 500 MG 24 hr tablet TAKE TWO (2) TABLETS BY MOUTH DAILY WITHBREAKFAST 180 tablet 1  . methocarbamol (ROBAXIN) 500 MG tablet Take 1 tablet (500 mg total) by mouth every 8 (eight) hours as needed for muscle spasms. 40 tablet 0  . Multiple Vitamin (MULTIVITAMIN WITH MINERALS) TABS Take 1 tablet by mouth every morning.  Men's once daily multivitamin packet (vitamin e, calcium, ginseng)    . ondansetron (ZOFRAN) 4 MG tablet Take 1 tablet (4 mg total) by mouth every 8 (eight) hours as needed for nausea or vomiting. 20 tablet 0  . Saw Palmetto, Serenoa repens, (SAW PALMETTO PO) Take 1 capsule by mouth daily.    . tamsulosin (FLOMAX) 0.4 MG CAPS capsule TAKE ONE CAPSULE BY MOUTH DAILY 90 capsule 1  . traZODone (DESYREL) 150 MG tablet TAKE ONE TABLET (150MG  TOTAL) BY MOUTH AT BEDTIME AS NEEDED FOR SLEEP 90 tablet 1  . TRULICITY 0.75 MG/0.5ML SOPN INJECT 0.75MG  INTO THE SKIN ONCE A WEEK FOR DIABETES 2 mL 2  . Turmeric 500 MG CAPS Take 1 capsule by mouth daily.    Marland Kitchen. zolpidem (AMBIEN) 10 MG tablet TAKE ONE TABLET (10MG  TOTAL) BY MOUTH ATBEDTIME AS NEEDED FOR SLEEP 90 tablet 0   No current  facility-administered medications for this visit.     Allergies  Allergen Reactions  . Azithromycin Hives  . Clarithromycin   . Erythromycin Hives  . Keflex [Cephalexin] Nausea Only  . Metformin Nausea And Vomiting  . Symbicort [Budesonide-Formoterol Fumarate] Other (See Comments)    States that 3 doses were used and breathing became worse    Family History  Problem Relation Age of Onset  . Emphysema Maternal Grandfather   . Emphysema Maternal Grandmother   . Clotting disorder Maternal Grandmother     Social History   Socioeconomic History  . Marital status: Divorced    Spouse name: Not on file  . Number of children: Not on file  . Years of education: Not on file  . Highest education level: Not on file  Occupational History  . Occupation: disability  Social Needs  . Financial resource strain: Not on file  . Food insecurity    Worry: Not on file    Inability: Not on file  . Transportation needs    Medical: Not on file    Non-medical: Not on file  Tobacco Use  . Smoking status: Current Every Day Smoker    Packs/day: 1.00    Years: 26.00    Pack years: 26.00    Types: Cigarettes    Start date: 12/17/1993  . Smokeless tobacco: Never Used  . Tobacco comment: 1ppd as of 11/06/17 ep  Substance and Sexual Activity  . Alcohol use: Never    Alcohol/week: 0.0 standard drinks    Frequency: Never  . Drug use: Never  . Sexual activity: Yes    Birth control/protection: Surgical  Lifestyle  . Physical activity    Days per week: Not on file    Minutes per session: Not on file  . Stress: Not on file  Relationships  . Social Musicianconnections    Talks on phone: Not on file    Gets together: Not on file    Attends religious service: Not on file    Active member of club or organization: Not on file    Attends meetings of clubs or organizations: Not on file    Relationship status: Not on file  . Intimate partner violence    Fear of current or ex partner: Not on file     Emotionally abused: Not on file    Physically abused: Not on file    Forced sexual activity: Not on file  Other Topics Concern  . Not on file  Social History Narrative   [facility] [place]/t increased pain where he was prescribed  percocet 7.5-325 mg for pain.    Hospitiliaztions: None  Health Maintenance:    Flu: Due this season   Tetanus: Completed in 2018  Pneumovax: Completed in 2019  Prevnar: N/A  Eye Doctor: No recent exam  Dental Exam: No recent exam     Providers: Vernona RiegerKatherine Lollie Gunner, PCP; Dr. Earlene Plateravis, Urology; Dr. Gerilyn Pilgrimoonquah, pain management; Dr. Sherene SiresWert, pulmonologist   I have personally reviewed and have noted: 1. The patient's medical and social history 2. Their use of alcohol, tobacco or illicit drugs 3. Their current medications and supplements 4. The patient's functional ability including ADL's, fall risks, home  safety risks and hearing or visual impairment. 5. Diet and physical activities 6. Evidence for depression or mood disorder  Subjective:   Review of Systems:   Constitutional: Denies fever, malaise, fatigue, headache or abrupt weight changes.  HEENT: Denies eye pain, eye redness, ear pain, ringing in the ears, wax buildup, runny nose, nasal congestion, bloody nose, or sore throat. Respiratory: Denies difficulty breathing.  Current smoker of 1-1/2 packs daily, ready to quit. Cardiovascular: Denies chest pain, chest tightness, palpitations or swelling in the hands or feet.  Gastrointestinal: Denies abdominal pain, bloating, diarrhea or blood in the stool. Chronic opioid-induced constipation.  Also increased gas with flatulence since starting Trulicity. GU: Denies urgency, frequency, pain with urination, burning sensation, blood in urine, odor or discharge. Musculoskeletal: Chronic arthralgias mostly to left knee. Skin: Denies redness, rashes, lesions or ulcercations.  Neurological: Denies dizziness, difficulty with memory, difficulty with speech  or problems with balance and coordination.  Psychiatric: Denies concerns for anxiety or depression.  Overall feels well managed on bupropion and citalopram.  No other specific complaints in a complete review of systems (except as listed in HPI above).  Objective:  PE:   There were no vitals taken for this visit. Wt Readings from Last 3 Encounters:  11/10/18 245 lb (111.1 kg)  09/30/18 240 lb 8 oz (109.1 kg)  09/14/18 230 lb (104.3 kg)    General: Appears their stated age, well developed, well nourished in NAD. Skin: Warm, dry and intact. No rashes, lesions or ulcerations noted. HEENT: Head: normal shape and size; Eyes: sclera white, no icterus, conjunctiva pink, PERRLA and EOMs intact; Ears: Tm's gray and intact, normal light reflex; Nose: mucosa pink and moist, septum midline; Throat/Mouth: Teeth present, mucosa pink and moist, no exudate, lesions or ulcerations noted.  Neck: Normal range of motion. Neck supple, trachea midline. No massses, lumps or thyromegaly present.  Cardiovascular: Normal rate and rhythm. S1,S2 noted.  No murmur, rubs or gallops noted. No JVD or BLE edema. No carotid bruits noted. Pulmonary/Chest: Normal effort and positive vesicular breath sounds. No respiratory distress. Abdomen: Soft and nontender. Normal bowel sounds, no bruits noted. No distention or masses noted. Liver, spleen and kidneys non palpable. Musculoskeletal: No signs of joint swelling. No difficulty with gait.  Neurological: Alert and oriented. Cranial nerves II-XII intact. Coordination normal. +DTRs bilaterally. Psychiatric: Mood and affect normal. Behavior is normal. Judgment and thought content normal.     BMET    Component Value Date/Time   NA 134 (L) 03/08/2019 1003   NA 138 11/05/2017 1057   K 4.0 03/08/2019 1003   CL 98 03/08/2019 1003   CO2 24 03/08/2019 1003   GLUCOSE 402 (H) 03/08/2019 1003   BUN 13 03/08/2019 1003   BUN 10 11/05/2017 1057   CREATININE 1.01 03/08/2019 1003    CALCIUM 9.7 03/08/2019 1003   GFRNONAA >60 07/13/2018 1504  GFRAA >60 07/13/2018 1504    Lipid Panel     Component Value Date/Time   CHOL 141 05/21/2019 0913   CHOL 239 (H) 08/02/2016 0947   TRIG 383.0 (H) 05/21/2019 0913   HDL 29.00 (L) 05/21/2019 0913   HDL 30 (L) 08/02/2016 0947   CHOLHDL 5 05/21/2019 0913   VLDL 76.6 (H) 05/21/2019 0913   LDLCALC Comment 08/02/2016 0947    CBC    Component Value Date/Time   WBC 9.8 07/13/2018 1504   RBC 4.88 07/13/2018 1504   HGB 15.0 07/13/2018 1504   HCT 43.8 07/13/2018 1504   PLT 202 07/13/2018 1504   MCV 89.8 07/13/2018 1504   MCH 30.7 07/13/2018 1504   MCHC 34.2 07/13/2018 1504   RDW 13.4 07/13/2018 1504   LYMPHSABS 2.4 03/24/2018 1758   MONOABS 0.6 03/24/2018 1758   EOSABS 0.3 03/24/2018 1758   BASOSABS 0.1 03/24/2018 1758    Hgb A1C Lab Results  Component Value Date   HGBA1C 9.9 (H) 05/21/2019      Assessment and Plan:   Medicare Annual Wellness Visit:  Diet: He endorses a fair diet. Breakfast: Skips Lunch: Fruit, eggs, sandwich  Dinner: Meat, vegetable, starch  Snacks: Fruit, occasional candy Desserts: 1-2 days weekly Beverages: Soda, water, sweet tea Physical activity: Sedentary Depression/mood screen: Negative Hearing: Intact to whispered voice Visual acuity: Grossly normal, he will schedule eye exam. ADLs: Capable Fall risk: None Home safety: Good Cognitive evaluation: Intact to orientation, naming, recall and repetition EOL planning: Adv directives, full code/ I agree  Preventative Medicine: Immunizations up-to-date.  Influenza vaccination due this fall.  Strongly advised to work on weight loss through a healthy diet and regular exercise.  Colon cancer screening and PSA due next year.  Exam today stable.  Labs reviewed.  Next appointment: 6 months for diabetes check.

## 2019-06-28 NOTE — Assessment & Plan Note (Signed)
Following with urology through [facility]. Doing well on current regimen, continue same.

## 2019-06-28 NOTE — Assessment & Plan Note (Signed)
Doing well on Prevacid, continue same. 

## 2019-06-28 NOTE — Assessment & Plan Note (Signed)
Ready to quit smoking, wanting to try Chantix again.  Only side effect of Chantix previously was irritability 10 years ago. Discussed other potential side effects of Chantix. Has been on Wellbutrin for years without improvement.   Rx for Chantix starting and continuing packs sent to pharmacy. Discussed to choose a quit date prior to starting and that the quit date must be within 7 days of starting.   He will update.

## 2019-06-28 NOTE — Assessment & Plan Note (Signed)
Immunizations up-to-date.  Influenza vaccination due this fall.  Strongly advised to work on weight loss through a healthy diet and regular exercise.  Colon cancer screening and PSA due next year.  Exam today stable.  Labs reviewed.  I have personally reviewed and have noted: 1. The patient's medical and social history 2. Their use of alcohol, tobacco or illicit drugs 3. Their current medications and supplements 4. The patient's functional ability including ADL's, fall risks, home safety risks and hearing or visual impairment. 5. Diet and physical activities 6. Evidence for depression or mood disorder

## 2019-06-28 NOTE — Assessment & Plan Note (Addendum)
Doing well on current regimen, continue same. Following with Dr. Melvyn Novas through pulmonology.  Ready to quit smoking, he opts for Chantix.  Prescription sent to pharmacy, discussed instructions for use and potential side effects.

## 2019-06-28 NOTE — Assessment & Plan Note (Signed)
Compliant to CPAP machine nightly, continue same. 

## 2019-06-28 NOTE — Patient Instructions (Addendum)
Start the Chantix starting back for tobacco abuse, follow the package instructions.   Choose your quit date before you start Chantix, this must be within one week of starting the medication.   Fill the continuing pack once you've finished with the starter pack.  Start exercising. You should be getting 150 minutes of moderate intensity exercise weekly.  It is important that you improve your diet. Please limit carbohydrates in the form of white bread, rice, pasta, sweets, fast food, fried food, sugary drinks, etc. Increase your consumption of fresh fruits and vegetables, whole grains, lean protein.  Ensure you are consuming 64 ounces of water daily.  Stop Trulicity.  We've increased your atorvastatin to 40 mg. I sent a new prescription to your pharmacy.  Start checking your blood sugars. You can check before any meal, 2 hours after a meal, bedtime.   Send me some blood sugar readings in 2 weeks via my chart.  Schedule a lab only appointment for on or after August 21, 2019.  Please schedule a follow up appointment in 6 months for diabetes check.   It was a pleasure to see you today!  Varenicline oral tablets What is this medicine? VARENICLINE (var e NI kleen) is used to help people quit smoking. It is used with a patient support program recommended by your physician. This medicine may be used for other purposes; ask your health care provider or pharmacist if you have questions. COMMON BRAND NAME(S): Chantix What should I tell my health care provider before I take this medicine? They need to know if you have any of these conditions:  heart disease  if you often drink alcohol  kidney disease  mental illness  on hemodialysis  seizures  history of stroke  suicidal thoughts, plans, or attempt; a previous suicide attempt by you or a family member  an unusual or allergic reaction to varenicline, other medicines, foods, dyes, or preservatives  pregnant or trying to get  pregnant  breast-feeding How should I use this medicine? Take this medicine by mouth after eating. Take with a full glass of water. Follow the directions on the prescription label. Take your doses at regular intervals. Do not take your medicine more often than directed. There are 3 ways you can use this medicine to help you quit smoking; talk to your health care professional to decide which plan is right for you: 1) you can choose a quit date and start this medicine 1 week before the quit date, or, 2) you can start taking this medicine before you choose a quit date, and then pick a quit date between day 8 and 35 days of treatment, or, 3) if you are not sure that you are able or willing to quit smoking right away, start taking this medicine and slowly decrease the amount you smoke as directed by your health care professional with the goal of being cigarette-free by week 12 of treatment. Stick to your plan; ask about support groups or other ways to help you remain cigarette-free. If you are motivated to quit smoking and did not succeed during a previous attempt with this medicine for reasons other than side effects, or if you returned to smoking after this treatment, speak with your health care professional about whether another course of this medicine may be right for you. A special MedGuide will be given to you by the pharmacist with each prescription and refill. Be sure to read this information carefully each time. Talk to your pediatrician regarding the use  of this medicine in children. This medicine is not approved for use in children. Overdosage: If you think you have taken too much of this medicine contact a poison control center or emergency room at once. NOTE: This medicine is only for you. Do not share this medicine with others. What if I miss a dose? If you miss a dose, take it as soon as you can. If it is almost time for your next dose, take only that dose. Do not take double or extra  doses. What may interact with this medicine?  alcohol  insulin  other medicines used to help people quit smoking  theophylline  warfarin This list may not describe all possible interactions. Give your health care provider a list of all the medicines, herbs, non-prescription drugs, or dietary supplements you use. Also tell them if you smoke, drink alcohol, or use illegal drugs. Some items may interact with your medicine. What should I watch for while using this medicine? It is okay if you do not succeed at your attempt to quit and have a cigarette. You can still continue your quit attempt and keep using this medicine as directed. Just throw away your cigarettes and get back to your quit plan. Talk to your health care provider before using other treatments to quit smoking. Using this medicine with other treatments to quit smoking may increase the risk for side effects compared to using a treatment alone. You may get drowsy or dizzy. Do not drive, use machinery, or do anything that needs mental alertness until you know how this medicine affects you. Do not stand or sit up quickly, especially if you are an older patient. This reduces the risk of dizzy or fainting spells. Decrease the number of alcoholic beverages that you drink during treatment with this medicine until you know if this medicine affects your ability to tolerate alcohol. Some people have experienced increased drunkenness (intoxication), unusual or sometimes aggressive behavior, or no memory of things that have happened (amnesia) during treatment with this medicine. Sleepwalking can happen during treatment with this medicine, and can sometimes lead to behavior that is harmful to you, other people, or property. Stop taking this medicine and tell your doctor if you start sleepwalking or have other unusual sleep-related activity. After taking this medicine, you may get up out of bed and do an activity that you do not know you are doing. The  next morning, you may have no memory of this. Activities include driving a car ("sleep-driving"), making and eating food, talking on the phone, sexual activity, and sleep-walking. Serious injuries have occurred. Stop the medicine and call your doctor right away if you find out you have done any of these activities. Do not take this medicine if you have used alcohol that evening. Do not take it if you have taken another medicine for sleep. The risk of doing these sleep-related activities is higher. Patients and their families should watch out for new or worsening depression or thoughts of suicide. Also watch out for sudden changes in feelings such as feeling anxious, agitated, panicky, irritable, hostile, aggressive, impulsive, severely restless, overly excited and hyperactive, or not being able to sleep. If this happens, call your health care professional. If you have diabetes and you quit smoking, the effects of insulin may be increased and you may need to reduce your insulin dose. Check with your doctor or health care professional about how you should adjust your insulin dose. What side effects may I notice from receiving this medicine?  Side effects that you should report to your doctor or health care professional as soon as possible:  allergic reactions like skin rash, itching or hives, swelling of the face, lips, tongue, or throat  acting aggressive, being angry or violent, or acting on dangerous impulses  breathing problems  changes in emotions or moods  chest pain or chest tightness  feeling faint or lightheaded, falls  hallucination, loss of contact with reality  mouth sores  redness, blistering, peeling or loosening of the skin, including inside the mouth  signs and symptoms of a stroke like changes in vision; confusion; trouble speaking or understanding; severe headaches; sudden numbness or weakness of the face, arm or leg; trouble walking; dizziness; loss of balance or  coordination  seizures  sleepwalking  suicidal thoughts or other mood changes Side effects that usually do not require medical attention (report to your doctor or health care professional if they continue or are bothersome):  constipation  gas  headache  nausea, vomiting  strange dreams  trouble sleeping This list may not describe all possible side effects. Call your doctor for medical advice about side effects. You may report side effects to FDA at 1-800-FDA-1088. Where should I keep my medicine? Keep out of the reach of children. Store at room temperature between 15 and 30 degrees C (59 and 86 degrees F). Throw away any unused medicine after the expiration date. NOTE: This sheet is a summary. It may not cover all possible information. If you have questions about this medicine, talk to your doctor, pharmacist, or health care provider.  2020 Elsevier/Gold Standard (2018-11-06 14:27:36)

## 2019-06-28 NOTE — Assessment & Plan Note (Signed)
Following with Urology through [facility], no changes recently. Continue current regimen.

## 2019-06-28 NOTE — Assessment & Plan Note (Signed)
Recommended fiber, water intake, regular exercise.

## 2019-06-28 NOTE — Assessment & Plan Note (Signed)
Triglycerides uncontrolled, would like to see LDL slightly less given his history of tobacco abuse, diabetes, hypertension.  Increase atorvastatin to 40 mg daily. Repeat lipids in 6 to 8 weeks.

## 2019-06-28 NOTE — Assessment & Plan Note (Signed)
Following with pain management. Continue current regimen.  

## 2019-06-28 NOTE — Assessment & Plan Note (Addendum)
Opioid induced constipation and gas. Using OTC products. Encouraged water and fiber consumption, regular exercise.

## 2019-06-28 NOTE — Assessment & Plan Note (Signed)
Feels well managed on current regimen. Following with pulmonology. No respiratory distress or wheezing on exam.

## 2019-06-28 NOTE — Assessment & Plan Note (Addendum)
Uncontrolled with A1c of 9.9 in June 2020. Not checking glucose levels. Having side effects of gas/flatulance from Trulicity. Compliant to Glipizide, Metformin, and Jardiance.  Stopped Trulicity given intolerance to gas/flatulence. Prescription provided for glucometer kit so he can start checking glucose readings.  He will send glucose readings in 2 weeks, at that point we will consider Lantus insulin.  Discussed directions for glucose checks and times to check.  He will update in 2 weeks.

## 2019-06-28 NOTE — Assessment & Plan Note (Signed)
Following with urology through [facility]. Doing well on Flomax, continue same.

## 2019-06-28 NOTE — Assessment & Plan Note (Signed)
Chronic, following with pain management

## 2019-06-28 NOTE — Assessment & Plan Note (Signed)
Doing well on Ambien nightly for sleep. Discussed risks of this medication with other medications. He verbalized understanding. Continue same.   Also managed on citalopram and bupropion.

## 2019-06-28 NOTE — Assessment & Plan Note (Signed)
Doing well overall on citalopram and bupropion, denies SI/HI. Also doing well on Ambien for which he takes nightly.  Continue same.

## 2019-06-28 NOTE — Assessment & Plan Note (Signed)
Chronic, following with pain management, continue current regimen of Norco PRN, Celebrex once daily as needed, gabapentin BID, Flexeril PRN.

## 2019-07-08 DIAGNOSIS — Z79891 Long term (current) use of opiate analgesic: Secondary | ICD-10-CM | POA: Diagnosis not present

## 2019-07-08 DIAGNOSIS — M25562 Pain in left knee: Secondary | ICD-10-CM | POA: Diagnosis not present

## 2019-07-08 DIAGNOSIS — M542 Cervicalgia: Secondary | ICD-10-CM | POA: Diagnosis not present

## 2019-07-08 DIAGNOSIS — M545 Low back pain: Secondary | ICD-10-CM | POA: Diagnosis not present

## 2019-07-09 ENCOUNTER — Telehealth: Payer: Self-pay

## 2019-07-09 NOTE — Telephone Encounter (Signed)
Patient called back and states he does use Trulicity now due to intolerance. Form faxed back to the pharmacy letting be aware.

## 2019-07-09 NOTE — Telephone Encounter (Signed)
Called patient and left a message. Received a fax from insurance stating patient could benefit from 90 day supply on Trulicity vs 30 day and if it is clinically appropriate to send RX for that. Per last note on 06/28/2019 looks like patient is not tolerating this medication and has stopped. Need to verify so I can fax form back appropriately.

## 2019-07-15 ENCOUNTER — Other Ambulatory Visit: Payer: Self-pay | Admitting: Primary Care

## 2019-07-15 DIAGNOSIS — G8929 Other chronic pain: Secondary | ICD-10-CM

## 2019-07-15 DIAGNOSIS — G4700 Insomnia, unspecified: Secondary | ICD-10-CM

## 2019-07-15 DIAGNOSIS — M25562 Pain in left knee: Secondary | ICD-10-CM

## 2019-07-16 NOTE — Telephone Encounter (Signed)
Noted.  Refill sent to pharmacy. 

## 2019-07-16 NOTE — Telephone Encounter (Signed)
Last prescribed on 04/16/2019. Last appointment on 06/28/2019. Next future appointment on 12/27/2019

## 2019-07-17 ENCOUNTER — Other Ambulatory Visit: Payer: Self-pay | Admitting: Internal Medicine

## 2019-07-20 ENCOUNTER — Other Ambulatory Visit: Payer: Self-pay | Admitting: Primary Care

## 2019-07-20 DIAGNOSIS — N3943 Post-void dribbling: Secondary | ICD-10-CM

## 2019-07-22 ENCOUNTER — Telehealth (HOSPITAL_COMMUNITY): Payer: Self-pay | Admitting: Primary Care

## 2019-07-22 ENCOUNTER — Ambulatory Visit (HOSPITAL_COMMUNITY): Payer: Medicare Other | Admitting: Physical Therapy

## 2019-07-22 ENCOUNTER — Encounter (HOSPITAL_COMMUNITY): Payer: Self-pay

## 2019-07-22 NOTE — Telephone Encounter (Signed)
07/22/19  left a message at 814 to say he was cx this morning due to an issue that has come up but will call back to reschedule

## 2019-08-07 ENCOUNTER — Other Ambulatory Visit: Payer: Self-pay | Admitting: Internal Medicine

## 2019-08-10 ENCOUNTER — Other Ambulatory Visit: Payer: Self-pay | Admitting: Primary Care

## 2019-08-10 DIAGNOSIS — M25562 Pain in left knee: Secondary | ICD-10-CM

## 2019-08-10 DIAGNOSIS — G8929 Other chronic pain: Secondary | ICD-10-CM

## 2019-08-20 ENCOUNTER — Telehealth: Payer: Self-pay | Admitting: Primary Care

## 2019-08-20 DIAGNOSIS — K219 Gastro-esophageal reflux disease without esophagitis: Secondary | ICD-10-CM

## 2019-08-23 ENCOUNTER — Other Ambulatory Visit: Payer: Self-pay

## 2019-08-23 ENCOUNTER — Other Ambulatory Visit (INDEPENDENT_AMBULATORY_CARE_PROVIDER_SITE_OTHER): Payer: Medicare Other

## 2019-08-23 DIAGNOSIS — E119 Type 2 diabetes mellitus without complications: Secondary | ICD-10-CM | POA: Diagnosis not present

## 2019-08-23 DIAGNOSIS — E785 Hyperlipidemia, unspecified: Secondary | ICD-10-CM | POA: Diagnosis not present

## 2019-08-23 LAB — LIPID PANEL
Cholesterol: 135 mg/dL (ref 0–200)
HDL: 31.1 mg/dL — ABNORMAL LOW (ref >39.00)
NonHDL: 103.75
Total CHOL/HDL Ratio: 4
Triglycerides: 341 mg/dL — ABNORMAL HIGH (ref 0.0–149.0)
VLDL: 68.2 mg/dL — ABNORMAL HIGH (ref 0.0–40.0)

## 2019-08-23 LAB — LDL CHOLESTEROL, DIRECT: Direct LDL: 74 mg/dL

## 2019-08-23 LAB — HEMOGLOBIN A1C: Hgb A1c MFr Bld: 10.8 % — ABNORMAL HIGH (ref 4.6–6.5)

## 2019-08-23 MED ORDER — PANTOPRAZOLE SODIUM 20 MG PO TBEC: 20.0000 mg | DELAYED_RELEASE_TABLET | Freq: Every day | ORAL | 0 refills | Status: DC

## 2019-08-23 NOTE — Addendum Note (Signed)
Addended by: Pleas Koch on: 08/23/2019 01:27 PM   Modules accepted: Orders

## 2019-08-23 NOTE — Telephone Encounter (Signed)
Tami at [facility] called.  Patient's rx for lansoprazole has been very hard to get. Tami asked patient if he was willing to switch to another medication and he said yes he would switch.  Tami recommends Protonix. Tami can be reached at [phone].

## 2019-08-23 NOTE — Telephone Encounter (Signed)
Noted. Please notify patient that we will switch to pantoprazole 20 mg once daily for heartburn. Have him notify us if he has breakthrough heartburn symptoms on this dose, we can always tweak.

## 2019-08-24 NOTE — Telephone Encounter (Signed)
Spoken and notified patient of Kate Clark's comments. Patient verbalized understanding.  

## 2019-08-26 ENCOUNTER — Other Ambulatory Visit: Payer: Self-pay | Admitting: Primary Care

## 2019-08-26 DIAGNOSIS — G8929 Other chronic pain: Secondary | ICD-10-CM

## 2019-08-26 DIAGNOSIS — M25561 Pain in right knee: Secondary | ICD-10-CM

## 2019-08-26 DIAGNOSIS — G894 Chronic pain syndrome: Secondary | ICD-10-CM

## 2019-08-26 NOTE — Telephone Encounter (Signed)
Last prescribed on 05/27/2019 . Last appointment on 06/28/2019. Next future appointment on 08/27/2019

## 2019-08-27 ENCOUNTER — Encounter: Payer: Self-pay | Admitting: Primary Care

## 2019-08-27 ENCOUNTER — Ambulatory Visit (INDEPENDENT_AMBULATORY_CARE_PROVIDER_SITE_OTHER): Payer: Medicare Other | Admitting: Primary Care

## 2019-08-27 ENCOUNTER — Other Ambulatory Visit: Payer: Self-pay

## 2019-08-27 ENCOUNTER — Telehealth: Payer: Self-pay

## 2019-08-27 VITALS — BP 122/82 | HR 88 | Temp 97.8°F | Ht 70.0 in | Wt 242.0 lb

## 2019-08-27 DIAGNOSIS — K219 Gastro-esophageal reflux disease without esophagitis: Secondary | ICD-10-CM | POA: Diagnosis not present

## 2019-08-27 DIAGNOSIS — Z23 Encounter for immunization: Secondary | ICD-10-CM | POA: Diagnosis not present

## 2019-08-27 DIAGNOSIS — E119 Type 2 diabetes mellitus without complications: Secondary | ICD-10-CM

## 2019-08-27 MED ORDER — BASAGLAR KWIKPEN 100 UNIT/ML ~~LOC~~ SOPN: 10.0000 [IU] | PEN_INJECTOR | Freq: Every day | SUBCUTANEOUS | 2 refills | Status: DC

## 2019-08-27 MED ORDER — PEN NEEDLES 31G X 6 MM MISC: 2 refills | Status: DC

## 2019-08-27 MED ORDER — PANTOPRAZOLE SODIUM 20 MG PO TBEC: 20.0000 mg | DELAYED_RELEASE_TABLET | Freq: Two times a day (BID) | ORAL | 1 refills | Status: DC

## 2019-08-27 MED ORDER — LANTUS SOLOSTAR 100 UNIT/ML ~~LOC~~ SOPN: 10.0000 [IU] | PEN_INJECTOR | Freq: Every day | SUBCUTANEOUS | 2 refills | Status: DC

## 2019-08-27 NOTE — Addendum Note (Signed)
Addended by: Jacqualin Combes on: 08/27/2019 12:26 PM   Modules accepted: Orders

## 2019-08-27 NOTE — Telephone Encounter (Signed)
Noted, refill sent to pharmacy. 

## 2019-08-27 NOTE — Assessment & Plan Note (Signed)
Uncontrolled and increase in A1C to 10.8 from 9.9 three months ago.   Suspect this is largely due to soda consumption with unhealthy diet and lack of exercise. He is maxed out on three oral medications, could not tolerate Trulicity due to GI upset.  Long discussion about his levels and where to go with treatment. Recommendations for Lantus 10 units HS, he agrees. Rx sent to pharmacy.   He will start checking blood sugars more frequently.  He will send glucose readings in 4-6 weeks. Follow up in 3 months.

## 2019-08-27 NOTE — Progress Notes (Signed)
Subjective:    Patient ID: Joseph [name], male    DOB: [birth year], 48 y.o.   MRN: [id number]  HPI  Joseph [name] is a 48 year old male with a history of obesity, type 2 diabetes, hypertension, hyperlipidemia, COPD, OSA who presents today for follow up of diabetes.  Current medications include: Metformin ER 1000 mg daily, Glipizide 10 mg BID, Jardiance 10 mg daily.  He is checking his blood glucose 2-3 times weekly and is getting readings of:  Before dinner: 230's-300's mostly. Some 197, 204  Last A1C: 10.8 in September 2020, 9.9 in June 2020 Last Eye Exam: He will schedule Last Foot Exam: Due in October 2020 Pneumonia Vaccination: Completed in 2019 ACE/ARB: Urine microalbumin Statin: atorvastatin   Diet currently consists of:  Breakfast: Skips Lunch: Skips Dinner: Meat, vegetable, starch Snacks: Chips Desserts: 1-2 times weekly Beverages: Sodas (12-14 20 ounce Coke's daily), little water.  Exercise: He is not exercising   BP Readings from Last 3 Encounters:  08/27/19 122/82  06/28/19 120/82  11/10/18 (!) 148/80     Review of Systems  Constitutional: Positive for fatigue.  Respiratory: Negative for shortness of breath.   Cardiovascular: Negative for chest pain.  Neurological: Negative for dizziness and headaches.       Past Medical History:  Diagnosis Date  . Acute encephalopathy 03/24/2018  . Anxiety   . Arthritis    oa left knee and lower back  . Asthma   . Cataract    hx of bilateral  . Chronic back pain   . Chronic pain of left knee   . COPD (chronic obstructive pulmonary disease) ([place])   . Diabetes mellitus    type 2  . Encephalopathy acute 03/24/2018  . GERD (gastroesophageal reflux disease)   . INGUINAL PAIN, LEFT 10/03/2009   Qualifier: Diagnosis of  By: Oneida Alar MD, Sandi L   . Oral thrush 11/10/2018  . Overdose    03-24-18 accidental  . Shortness of breath    with heavy exertion  . Sleep apnea      Social History   Socioeconomic History  .  Marital status: Divorced    Spouse name: Not on file  . Number of children: Not on file  . Years of education: Not on file  . Highest education level: Not on file  Occupational History  . Occupation: disability  Social Needs  . Financial resource strain: Not on file  . Food insecurity    Worry: Not on file    Inability: Not on file  . Transportation needs    Medical: Not on file    Non-medical: Not on file  Tobacco Use  . Smoking status: Current Every Day Smoker    Packs/day: 1.00    Years: 26.00    Pack years: 26.00    Types: Cigarettes    Start date: 12/17/1993  . Smokeless tobacco: Never Used  . Tobacco comment: 1ppd as of 11/06/17 ep  Substance and Sexual Activity  . Alcohol use: Never    Alcohol/week: 0.0 standard drinks    Frequency: Never  . Drug use: Never  . Sexual activity: Yes    Birth control/protection: Surgical  Lifestyle  . Physical activity    Days per week: Not on file    Minutes per session: Not on file  . Stress: Not on file  Relationships  . Social Herbalist on phone: Not on file    Gets together: Not on file  Attends religious service: Not on file    Active member of club or organization: Not on file    Attends meetings of clubs or organizations: Not on file    Relationship status: Not on file  . Intimate partner violence    Fear of current or ex partner: Not on file    Emotionally abused: Not on file    Physically abused: Not on file    Forced sexual activity: Not on file  Other Topics Concern  . Not on file  Social History Narrative   Recent visit to [facility] in [place] d/t increased pain where he was prescribed percocet 7.5-325 mg for pain.    Past Surgical History:  Procedure Laterality Date  . CARPAL TUNNEL RELEASE Left   . CATARACT EXTRACTION W/PHACO Right 03/11/2016   Procedure: CATARACT EXTRACTION PHACO AND INTRAOCULAR LENS PLACEMENT (IOC);  Surgeon: Tonny Branch, MD;  Location: [facility];  Service: Ophthalmology;   Laterality: Right;  CDE 4.24  . EYE SURGERY Right 2018   ioc with lens replacement  . HERNIA REPAIR Bilateral 0867   umbilical  . KNEE ARTHROSCOPY WITH MEDIAL MENISECTOMY Left 04/22/2014   Procedure: LEFT KNEE ARTHROSCOPY WITH MEDIAL MENISECTOMY;  Surgeon: Carole Civil, MD;  Location: [facility];  Service: Orthopedics;  Laterality: Left;  . KNEE ARTHROSCOPY WITH MEDIAL MENISECTOMY Right 12/06/2015   Procedure: RIGHT KNEE ARTHROSCOPY WITH MEDIAL MENISECTOMY;  Surgeon: Carole Civil, MD;  Location: [facility];  Service: Orthopedics;  Laterality: Right;  . KNEE ARTHROSCOPY WITH MEDIAL MENISECTOMY Left 03/20/2017   Procedure: KNEE ARTHROSCOPY WITH MEDIAL MENISECTOMY;  Surgeon: Carole Civil, MD;  Location: [facility];  Service: Orthopedics;  Laterality: Left;  . ORIF WRIST FRACTURE Left 12/19/2016   Procedure: OPEN REDUCTION INTERNAL FIXATION (ORIF) WRIST FRACTURE;  Surgeon: Leanora Cover, MD;  Location: [facility];  Service: Orthopedics;  Laterality: Left;  accumed   . PARTIAL KNEE ARTHROPLASTY Left 07/21/2018   Procedure: LEFT UNICOMPARTMENTAL KNEE;  Surgeon: Renette Butters, MD;  Location: [facility];  Service: Orthopedics;  Laterality: Left;  . SHOULDER ARTHROCENTESIS Right 2008 or 2010  . VASECTOMY  2014    Family History  Problem Relation Age of Onset  . Emphysema Maternal Grandfather   . Emphysema Maternal Grandmother   . Clotting disorder Maternal Grandmother     Allergies  Allergen Reactions  . Azithromycin Hives  . Clarithromycin   . Erythromycin Hives  . Keflex [Cephalexin] Nausea Only  . Metformin Nausea And Vomiting  . Symbicort [Budesonide-Formoterol Fumarate] Other (See Comments)    States that 3 doses were used and breathing became worse    Current Outpatient Medications on File Prior to Visit  Medication Sig Dispense Refill  . albuterol (PROVENTIL) (2.5 MG/3ML) 0.083% nebulizer solution INHALE 3 MLS VIA NEBULIZER FOUR TIMES DAILY AS NEEDED FOR SHORTNESS OF  BREATH 360 mL 1  . ANDROGEL PUMP 20.25 MG/ACT (1.62%) GEL Apply 2 Pump topically daily.     Marland Kitchen aspirin EC 81 MG tablet Take 1 tablet (81 mg total) by mouth 2 (two) times daily. For DVT prophylaxis 60 tablet 0  . atorvastatin (LIPITOR) 40 MG tablet Take 1 tablet (40 mg total) by mouth daily. For cholesterol. 90 tablet 3  . betamethasone dipropionate (DIPROLENE) 0.05 % cream Apply topically 2 (two) times daily as needed.     . blood glucose meter kit and supplies KIT Dispense based on patient and insurance preference. Use up to four times daily as directed. (FOR ICD-9  250.00, 250.01). 1 each 0  . BREO ELLIPTA 200-25 MCG/INH AEPB INHALE ONE PUFF INTO THE LUNGS DAILY 60 each 11  . buPROPion (WELLBUTRIN SR) 150 MG 12 hr tablet TAKE ONE TABLET BY MOUTH TWICE A DAY 180 tablet 1  . celecoxib (CELEBREX) 200 MG capsule TAKE ONE CAPSULE BY MOUTH ONCE DAILY AS NEEDED for pain. 90 capsule 0  . citalopram (CELEXA) 40 MG tablet TAKE ONE (1) TABLET BY MOUTH EVERY DAY 30 tablet 9  . empagliflozin (JARDIANCE) 10 MG TABS tablet Take 10 mg by mouth every morning. For diabetes. 90 tablet 3  . gabapentin (NEURONTIN) 300 MG capsule Take 2 capsules (600 mg total) by mouth 2 (two) times daily. 360 capsule 0  . glipiZIDE (GLUCOTROL) 10 MG tablet Take 1 tablet (10 mg total) by mouth 2 (two) times daily. For diabetes. 180 tablet 3  . HYDROcodone-acetaminophen (NORCO/VICODIN) 5-325 MG tablet     . ipratropium (ATROVENT) 0.02 % nebulizer solution USE 1 VIAL VIA NEBULIZER FOUR TIMES DAILY AS NEEDED 300 mL 0  . Ipratropium-Albuterol (COMBIVENT) 20-100 MCG/ACT AERS respimat Inhale 1 puff into the lungs every 6 (six) hours as needed for wheezing. 1 Inhaler 2  . metFORMIN (GLUCOPHAGE-XR) 500 MG 24 hr tablet TAKE TWO (2) TABLETS BY MOUTH DAILY WITHBREAKFAST 180 tablet 1  . Multiple Vitamin (MULTIVITAMIN WITH MINERALS) TABS Take 1 tablet by mouth every morning. Men's once daily multivitamin packet (vitamin e, calcium, ginseng)    .  pantoprazole (PROTONIX) 20 MG tablet Take 1 tablet (20 mg total) by mouth daily. For heartburn. 90 tablet 0  . Saw Palmetto, Serenoa repens, (SAW PALMETTO PO) Take 1 capsule by mouth daily.    . tamsulosin (FLOMAX) 0.4 MG CAPS capsule TAKE ONE CAPSULE BY MOUTH DAILY 90 capsule 1  . Turmeric 500 MG CAPS Take 1 capsule by mouth daily.    . varenicline (CHANTIX CONTINUING MONTH PAK) 1 MG tablet Take 1 tablet (1 mg total) by mouth 2 (two) times daily. 60 tablet 0  . varenicline (CHANTIX STARTING MONTH PAK) 0.5 MG X 11 & 1 MG X 42 tablet Take 0.5 mg tablet by mouth daily for 3 days, then increase to 0.5 mg twice daily for 4 days, then increase to 1 mg twice daily. 53 tablet 0  . zolpidem (AMBIEN) 10 MG tablet TAKE ONE TABLET (10MG TOTAL) BY MOUTH ATBEDTIME AS NEEDED FOR SLEEP 90 tablet 0   No current facility-administered medications on file prior to visit.     BP 122/82   Pulse 88   Temp 97.8 F (36.6 C) (Temporal)   Ht '5\' 10"'  (1.778 m)   Wt 242 lb (109.8 kg)   SpO2 98%   BMI 34.72 kg/m    Objective:   Physical Exam  Constitutional: He appears well-nourished.  Neck: Neck supple.  Cardiovascular: Normal rate and regular rhythm.  Respiratory: Effort normal and breath sounds normal.  Skin: Skin is warm and dry.  Psychiatric: He has a normal mood and affect.           Assessment & Plan:

## 2019-08-27 NOTE — Patient Instructions (Addendum)
Start Lantus insulin for diabetes. Inject 10 units into the skin every night at bedtime.  Start checking your blood sugars every day, at least twice daily. You can check before any meal, 2 hours after any meal, bedtime.  Send me some glucose readings via My Chart in 4 weeks.  LIMIT sodas! Ensure you are consuming 64 ounces of water daily.  Please schedule a follow up appointment in 3 months for diabetes check.  It was a pleasure to see you today!   Diabetes Mellitus and Nutrition, Adult When you have diabetes (diabetes mellitus), it is very important to have healthy eating habits because your blood sugar (glucose) levels are greatly affected by what you eat and drink. Eating healthy foods in the appropriate amounts, at about the same times every day, can help you:  Control your blood glucose.  Lower your risk of heart disease.  Improve your blood pressure.  Reach or maintain a healthy weight. Every person with diabetes is different, and each person has different needs for a meal plan. Your health care provider may recommend that you work with a diet and nutrition specialist (dietitian) to make a meal plan that is best for you. Your meal plan may vary depending on factors such as:  The calories you need.  The medicines you take.  Your weight.  Your blood glucose, blood pressure, and cholesterol levels.  Your activity level.  Other health conditions you have, such as heart or kidney disease. How do carbohydrates affect me? Carbohydrates, also called carbs, affect your blood glucose level more than any other type of food. Eating carbs naturally raises the amount of glucose in your blood. Carb counting is a method for keeping track of how many carbs you eat. Counting carbs is important to keep your blood glucose at a healthy level, especially if you use insulin or take certain oral diabetes medicines. It is important to know how many carbs you can safely have in each meal. This is  different for every person. Your dietitian can help you calculate how many carbs you should have at each meal and for each snack. Foods that contain carbs include:  Bread, cereal, rice, pasta, and crackers.  Potatoes and corn.  Peas, beans, and lentils.  Milk and yogurt.  Fruit and juice.  Desserts, such as cakes, cookies, ice cream, and candy. How does alcohol affect me? Alcohol can cause a sudden decrease in blood glucose (hypoglycemia), especially if you use insulin or take certain oral diabetes medicines. Hypoglycemia can be a life-threatening condition. Symptoms of hypoglycemia (sleepiness, dizziness, and confusion) are similar to symptoms of having too much alcohol. If your health care provider says that alcohol is safe for you, follow these guidelines:  Limit alcohol intake to no more than 1 drink per day for nonpregnant women and 2 drinks per day for men. One drink equals 12 oz of beer, 5 oz of wine, or 1 oz of hard liquor.  Do not drink on an empty stomach.  Keep yourself hydrated with water, diet soda, or unsweetened iced tea.  Keep in mind that regular soda, juice, and other mixers may contain a lot of sugar and must be counted as carbs. What are tips for following this plan?  Reading food labels  Start by checking the serving size on the "Nutrition Facts" label of packaged foods and drinks. The amount of calories, carbs, fats, and other nutrients listed on the label is based on one serving of the item. Many items contain  more than one serving per package.  Check the total grams (g) of carbs in one serving. You can calculate the number of servings of carbs in one serving by dividing the total carbs by 15. For example, if a food has 30 g of total carbs, it would be equal to 2 servings of carbs.  Check the number of grams (g) of saturated and trans fats in one serving. Choose foods that have low or no amount of these fats.  Check the number of milligrams (mg) of salt  (sodium) in one serving. Most people should limit total sodium intake to less than 2,300 mg per day.  Always check the nutrition information of foods labeled as "low-fat" or "nonfat". These foods may be higher in added sugar or refined carbs and should be avoided.  Talk to your dietitian to identify your daily goals for nutrients listed on the label. Shopping  Avoid buying canned, premade, or processed foods. These foods tend to be high in fat, sodium, and added sugar.  Shop around the outside edge of the grocery store. This includes fresh fruits and vegetables, bulk grains, fresh meats, and fresh dairy. Cooking  Use low-heat cooking methods, such as baking, instead of high-heat cooking methods like deep frying.  Cook using healthy oils, such as olive, canola, or sunflower oil.  Avoid cooking with butter, cream, or high-fat meats. Meal planning  Eat meals and snacks regularly, preferably at the same times every day. Avoid going long periods of time without eating.  Eat foods high in fiber, such as fresh fruits, vegetables, beans, and whole grains. Talk to your dietitian about how many servings of carbs you can eat at each meal.  Eat 4-6 ounces (oz) of lean protein each day, such as lean meat, chicken, fish, eggs, or tofu. One oz of lean protein is equal to: ? 1 oz of meat, chicken, or fish. ? 1 egg. ?  cup of tofu.  Eat some foods each day that contain healthy fats, such as avocado, nuts, seeds, and fish. Lifestyle  Check your blood glucose regularly.  Exercise regularly as told by your health care provider. This may include: ? 150 minutes of moderate-intensity or vigorous-intensity exercise each week. This could be brisk walking, biking, or water aerobics. ? Stretching and doing strength exercises, such as yoga or weightlifting, at least 2 times a week.  Take medicines as told by your health care provider.  Do not use any products that contain nicotine or tobacco, such as  cigarettes and e-cigarettes. If you need help quitting, ask your health care provider.  Work with a Veterinary surgeoncounselor or diabetes educator to identify strategies to manage stress and any emotional and social challenges. Questions to ask a health care provider  Do I need to meet with a diabetes educator?  Do I need to meet with a dietitian?  What number can I call if I have questions?  When are the best times to check my blood glucose? Where to find more information:  American Diabetes Association: diabetes.org  Academy of Nutrition and Dietetics: www.eatright.[employer]org  National Institute of Diabetes and Digestive and Kidney Diseases (NIH): CarFlippers.tnwww.niddk.nih.gov Summary  A healthy meal plan will help you control your blood glucose and maintain a healthy lifestyle.  Working with a diet and nutrition specialist (dietitian) can help you make a meal plan that is best for you.  Keep in mind that carbohydrates (carbs) and alcohol have immediate effects on your blood glucose levels. It is important to count  carbs and to use alcohol carefully. This information is not intended to replace advice given to you by your health care provider. Make sure you discuss any questions you have with your health care provider. Document Released: 08/15/2005 Document Revised: 10/31/2017 Document Reviewed: 12/23/2016 Elsevier Patient Education  2020 [employer].

## 2019-08-27 NOTE — Telephone Encounter (Signed)
[place] (Did not leave a name) said insurance is requiring PA for lantus and could be if over 90 day supply another reason ins not approving;  Can do 1) do a prior auth for lantus; 2) Ins covers Basaglar, Levemir and Zeb Comfort so could change medication or  3) could try changing instructions on lantus.  [place] request cb.

## 2019-08-27 NOTE — Assessment & Plan Note (Signed)
Breakthrough symptoms at night on pantoprazole, having to take 1 tablet BID with improvement. New Rx sent to pharmacy.

## 2019-08-27 NOTE — Telephone Encounter (Signed)
Noted. Prescription changed to [place] as this appears to be covered by his insurance.

## 2019-08-31 DIAGNOSIS — M25562 Pain in left knee: Secondary | ICD-10-CM | POA: Diagnosis not present

## 2019-08-31 DIAGNOSIS — Z79891 Long term (current) use of opiate analgesic: Secondary | ICD-10-CM | POA: Diagnosis not present

## 2019-08-31 DIAGNOSIS — M545 Low back pain: Secondary | ICD-10-CM | POA: Diagnosis not present

## 2019-08-31 DIAGNOSIS — M542 Cervicalgia: Secondary | ICD-10-CM | POA: Diagnosis not present

## 2019-09-10 DIAGNOSIS — N528 Other male erectile dysfunction: Secondary | ICD-10-CM | POA: Diagnosis not present

## 2019-09-10 DIAGNOSIS — E291 Testicular hypofunction: Secondary | ICD-10-CM | POA: Diagnosis not present

## 2019-09-10 DIAGNOSIS — Z125 Encounter for screening for malignant neoplasm of prostate: Secondary | ICD-10-CM | POA: Diagnosis not present

## 2019-09-10 IMAGING — DX DG CERVICAL SPINE COMPLETE 4+V
5 series · 5 of 5 positions shown · non-contrast
Comparison: None.

CLINICAL DATA: Fall several months ago with persistent neck pain.

EXAM:
CERVICAL SPINE - COMPLETE 4+ VIEW

[c-spine lat]
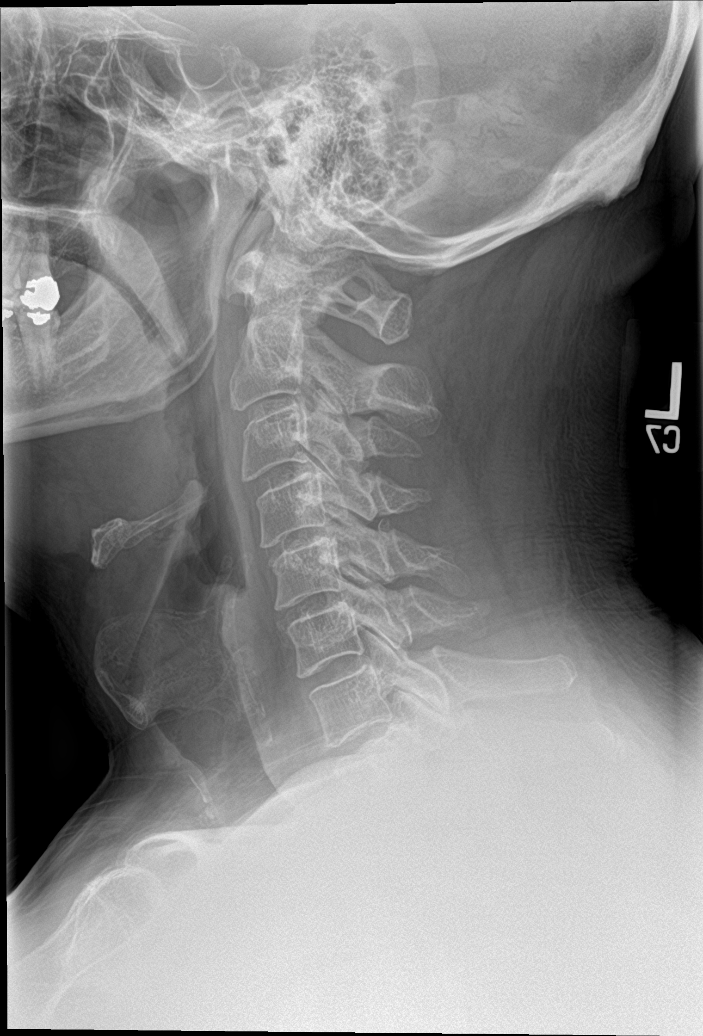

[c-spine obl (1 of 2)]
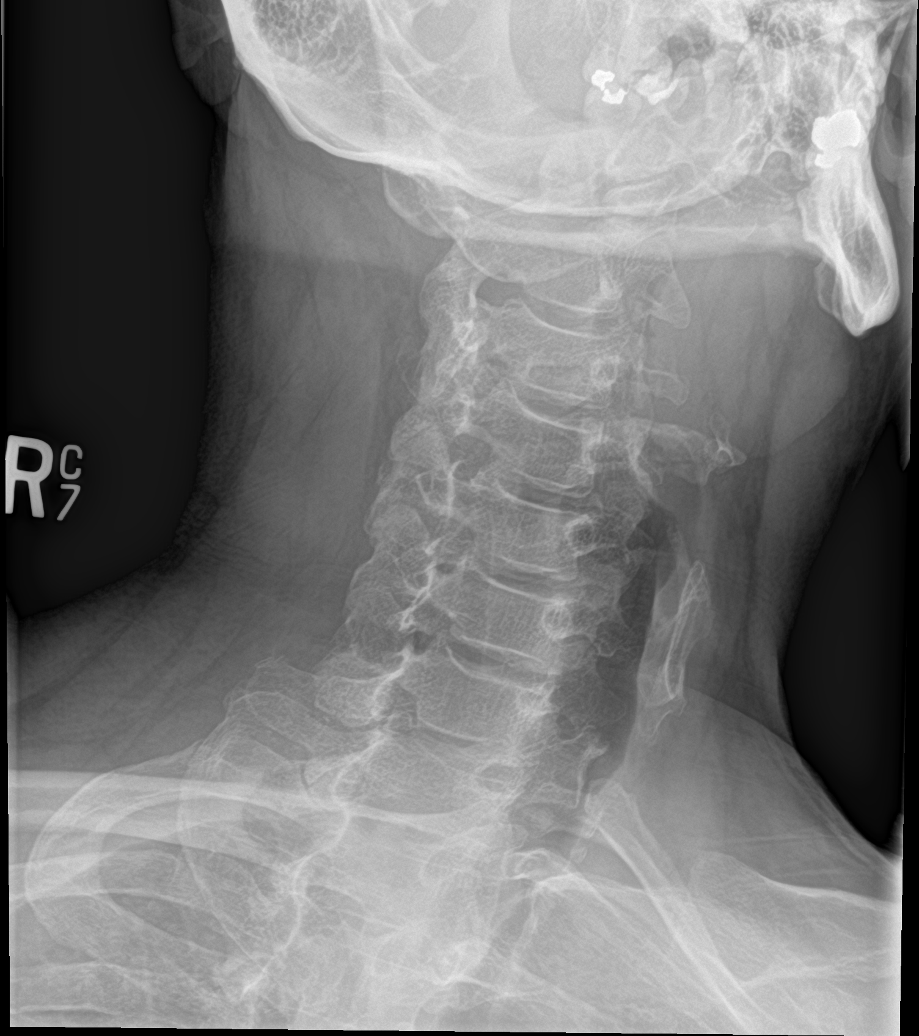

[c-spine obl (2 of 2)]
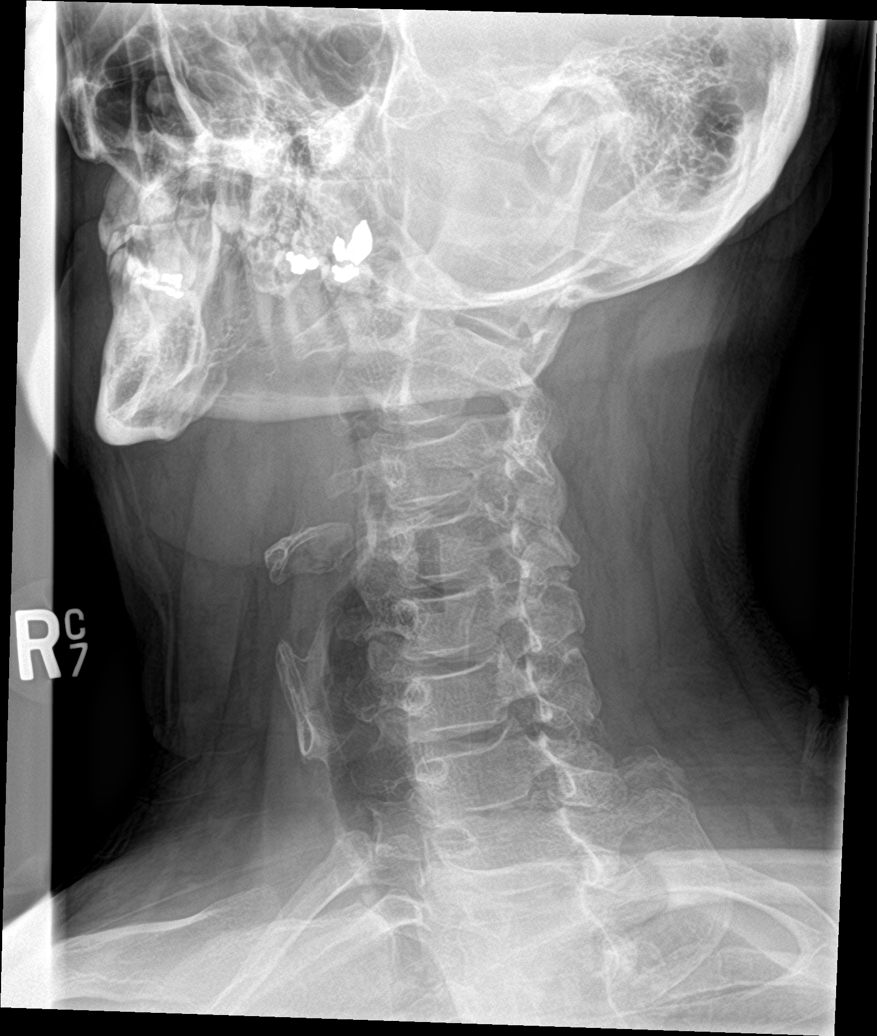

[c-spine ap]
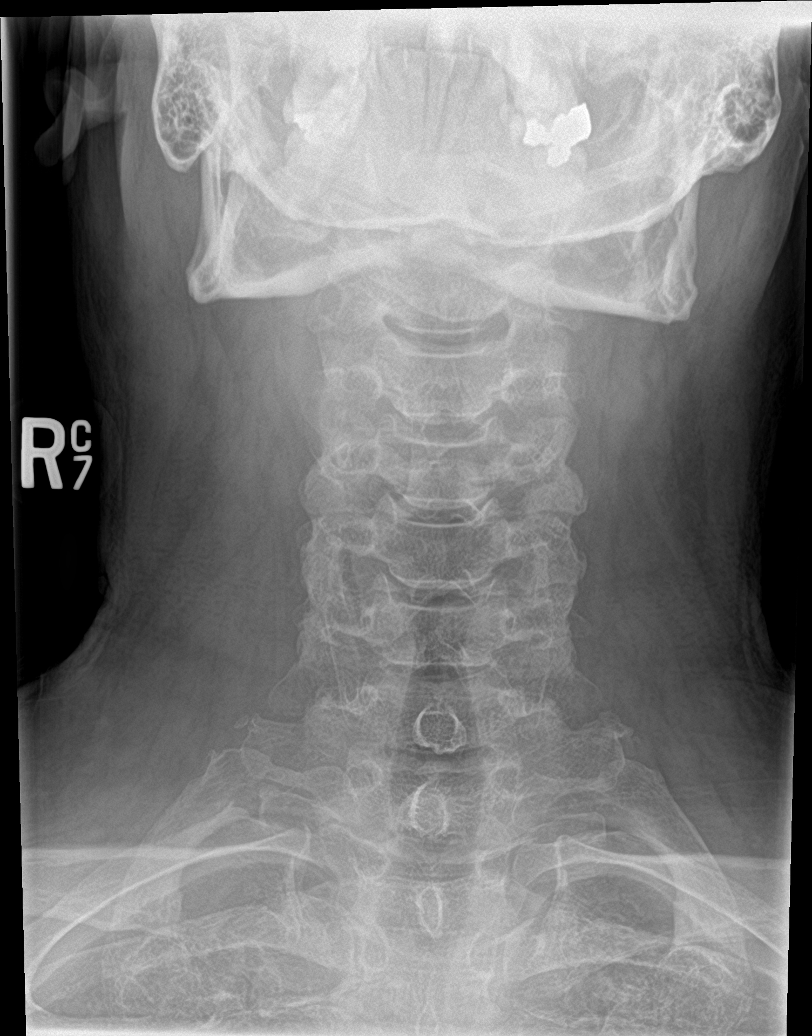

[c-spine open mouth]
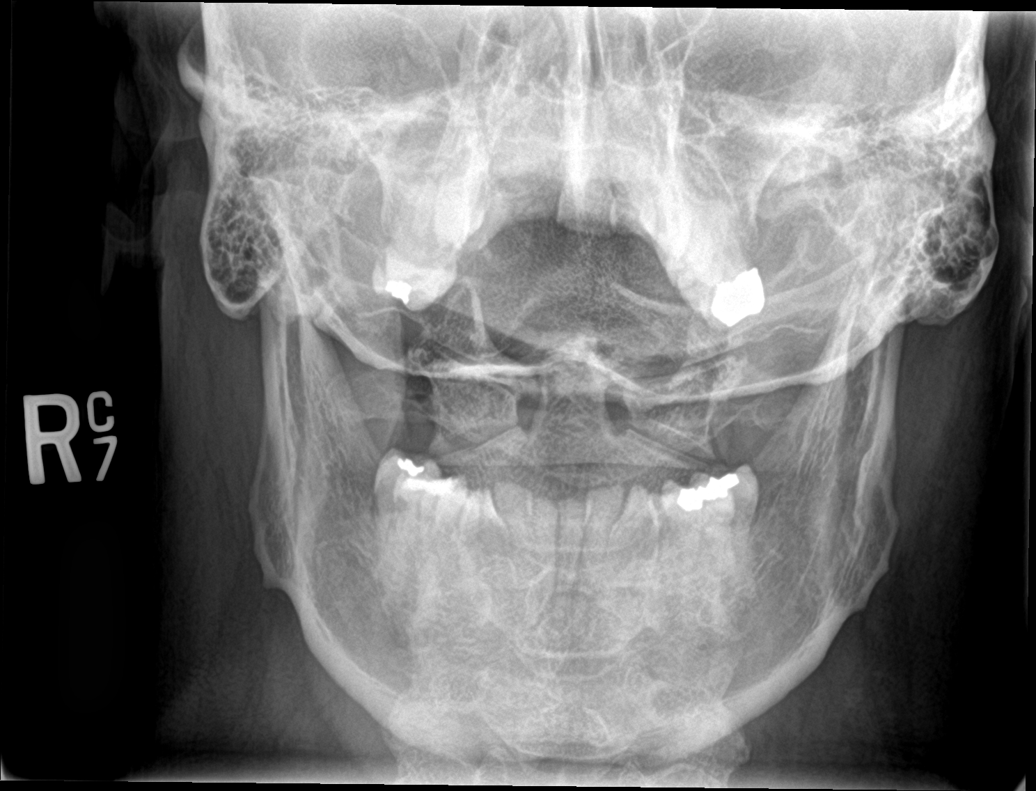

[5 of 5 positions shown; findings below may reference images not displayed]

FINDINGS: Vertebral body alignment and heights are normal. Very minimal
spondylosis of the cervical spine. Disc space heights are
maintained. Prevertebral soft tissues are normal. No significant
neural foraminal narrowing. No acute fracture or subluxation.
Atlantoaxial articulation is normal.
IMPRESSION: No acute findings.

Minimal spondylosis.

## 2019-09-11 ENCOUNTER — Other Ambulatory Visit: Payer: Self-pay | Admitting: Primary Care

## 2019-09-15 ENCOUNTER — Encounter: Payer: Self-pay | Admitting: Internal Medicine

## 2019-10-06 ENCOUNTER — Other Ambulatory Visit: Payer: Self-pay | Admitting: Primary Care

## 2019-10-07 ENCOUNTER — Encounter: Payer: Self-pay | Admitting: Gastroenterology

## 2019-10-07 DIAGNOSIS — K219 Gastro-esophageal reflux disease without esophagitis: Secondary | ICD-10-CM

## 2019-10-08 MED ORDER — BUPROPION HCL ER (SR) 150 MG PO TB12: 150.0000 mg | ORAL_TABLET | Freq: Two times a day (BID) | ORAL | 0 refills | Status: DC

## 2019-10-08 MED ORDER — PANTOPRAZOLE SODIUM 20 MG PO TBEC: 20.0000 mg | DELAYED_RELEASE_TABLET | Freq: Two times a day (BID) | ORAL | 0 refills | Status: DC

## 2019-10-15 ENCOUNTER — Other Ambulatory Visit: Payer: Self-pay | Admitting: Primary Care

## 2019-10-15 DIAGNOSIS — G4700 Insomnia, unspecified: Secondary | ICD-10-CM

## 2019-10-15 DIAGNOSIS — G8929 Other chronic pain: Secondary | ICD-10-CM

## 2019-10-15 NOTE — Telephone Encounter (Signed)
Last prescribed on 07/16/2019  . Last appointment on 08/27/2019. Next future appointment on 11/23/2019

## 2019-10-15 NOTE — Telephone Encounter (Signed)
Noted, refill sent to pharmacy. 

## 2019-10-21 DIAGNOSIS — Z79891 Long term (current) use of opiate analgesic: Secondary | ICD-10-CM | POA: Diagnosis not present

## 2019-10-21 DIAGNOSIS — M542 Cervicalgia: Secondary | ICD-10-CM | POA: Diagnosis not present

## 2019-10-21 DIAGNOSIS — M545 Low back pain: Secondary | ICD-10-CM | POA: Diagnosis not present

## 2019-10-21 DIAGNOSIS — M25562 Pain in left knee: Secondary | ICD-10-CM | POA: Diagnosis not present

## 2019-11-09 ENCOUNTER — Other Ambulatory Visit: Payer: Self-pay | Admitting: Internal Medicine

## 2019-11-09 MED ORDER — ALBUTEROL SULFATE (2.5 MG/3ML) 0.083% IN NEBU: INHALATION_SOLUTION | RESPIRATORY_TRACT | 0 refills | Status: DC

## 2019-11-11 ENCOUNTER — Ambulatory Visit: Payer: Medicare Other | Admitting: Internal Medicine

## 2019-11-15 ENCOUNTER — Other Ambulatory Visit: Payer: Self-pay | Admitting: Primary Care

## 2019-11-23 ENCOUNTER — Ambulatory Visit: Payer: Medicare Other | Admitting: Primary Care

## 2019-12-13 ENCOUNTER — Other Ambulatory Visit: Payer: Self-pay | Admitting: Internal Medicine

## 2019-12-13 DIAGNOSIS — J449 Chronic obstructive pulmonary disease, unspecified: Secondary | ICD-10-CM

## 2019-12-14 ENCOUNTER — Telehealth: Payer: Self-pay | Admitting: Internal Medicine

## 2019-12-14 DIAGNOSIS — J449 Chronic obstructive pulmonary disease, unspecified: Secondary | ICD-10-CM

## 2019-12-14 NOTE — Telephone Encounter (Signed)
Attempted to call pt but line went to VM. Left message for pt to return call. 

## 2019-12-15 MED ORDER — BREO ELLIPTA 200-25 MCG/INH IN AEPB: 1.0000 | INHALATION_SPRAY | Freq: Every day | RESPIRATORY_TRACT | 0 refills | Status: DC

## 2019-12-15 NOTE — Telephone Encounter (Signed)
Spoke with pt. He is needing a refill on Breo. Advised him that he needs an OV with Dr. Sherene Sires. This has been scheduled for 01/04/2020 at 1545. Rx has been sent in. Nothing further was needed.

## 2019-12-16 DIAGNOSIS — M542 Cervicalgia: Secondary | ICD-10-CM | POA: Diagnosis not present

## 2019-12-16 DIAGNOSIS — Z79891 Long term (current) use of opiate analgesic: Secondary | ICD-10-CM | POA: Diagnosis not present

## 2019-12-16 DIAGNOSIS — M545 Low back pain: Secondary | ICD-10-CM | POA: Diagnosis not present

## 2019-12-16 DIAGNOSIS — M25562 Pain in left knee: Secondary | ICD-10-CM | POA: Diagnosis not present

## 2019-12-27 ENCOUNTER — Encounter: Payer: Self-pay | Admitting: Primary Care

## 2019-12-27 ENCOUNTER — Other Ambulatory Visit: Payer: Self-pay

## 2019-12-27 ENCOUNTER — Ambulatory Visit (INDEPENDENT_AMBULATORY_CARE_PROVIDER_SITE_OTHER): Payer: Medicare HMO | Admitting: Primary Care

## 2019-12-27 VITALS — BP 118/78 | HR 100 | Temp 97.4°F | Ht 70.0 in | Wt 251.8 lb

## 2019-12-27 DIAGNOSIS — E119 Type 2 diabetes mellitus without complications: Secondary | ICD-10-CM

## 2019-12-27 DIAGNOSIS — M1712 Unilateral primary osteoarthritis, left knee: Secondary | ICD-10-CM | POA: Diagnosis not present

## 2019-12-27 LAB — POCT GLYCOSYLATED HEMOGLOBIN (HGB A1C): Hemoglobin A1C: 9.4 % — AB (ref 4.0–5.6)

## 2019-12-27 NOTE — Assessment & Plan Note (Signed)
Chronic really to bilateral knees. Handicap placard renewed, he uses this sparingly.

## 2019-12-27 NOTE — Assessment & Plan Note (Signed)
A1C today of 9.4 which has hardly improved. Suspect this is due to confusion from his insulin dose as he's injecting 1 unit. Discussed to inject 10 units rather than 1 unit.  Also discussed the absolute need to check blood sugars, he verbalized understanding.   Continue current regimen. He will increase Basaglar to 10 units.  Follow up in 3 months for repeat A1C.

## 2019-12-27 NOTE — Patient Instructions (Addendum)
Inject 10 units of Basaglar insulin daily. Continue your other medications.  Start checking your blood sugar levels.  Appropriate times to check your blood sugar levels are:  -Before any meal (breakfast, lunch, dinner) -Two hours after any meal (breakfast, lunch, dinner) -Bedtime  Record your readings and notify me if you continue to consistently run at or above 200 after 2 weeks.  It is important that you improve your diet. Please limit carbohydrates in the form of white bread, rice, pasta, sweets, fast food, fried food, sugary drinks, etc. Increase your consumption of fresh fruits and vegetables, whole grains, lean protein.  Ensure you are consuming 64 ounces of water daily.  Please schedule a follow up appointment in 3 months for diabetes check.  It was a pleasure to see you today!   Diabetes Mellitus and Nutrition, Adult When you have diabetes (diabetes mellitus), it is very important to have healthy eating habits because your blood sugar (glucose) levels are greatly affected by what you eat and drink. Eating healthy foods in the appropriate amounts, at about the same times every day, can help you:  Control your blood glucose.  Lower your risk of heart disease.  Improve your blood pressure.  Reach or maintain a healthy weight. Every person with diabetes is different, and each person has different needs for a meal plan. Your health care provider may recommend that you work with a diet and nutrition specialist (dietitian) to make a meal plan that is best for you. Your meal plan may vary depending on factors such as:  The calories you need.  The medicines you take.  Your weight.  Your blood glucose, blood pressure, and cholesterol levels.  Your activity level.  Other health conditions you have, such as heart or kidney disease. How do carbohydrates affect me? Carbohydrates, also called carbs, affect your blood glucose level more than any other type of food. Eating carbs  naturally raises the amount of glucose in your blood. Carb counting is a method for keeping track of how many carbs you eat. Counting carbs is important to keep your blood glucose at a healthy level, especially if you use insulin or take certain oral diabetes medicines. It is important to know how many carbs you can safely have in each meal. This is different for every person. Your dietitian can help you calculate how many carbs you should have at each meal and for each snack. Foods that contain carbs include:  Bread, cereal, rice, pasta, and crackers.  Potatoes and corn.  Peas, beans, and lentils.  Milk and yogurt.  Fruit and juice.  Desserts, such as cakes, cookies, ice cream, and candy. How does alcohol affect me? Alcohol can cause a sudden decrease in blood glucose (hypoglycemia), especially if you use insulin or take certain oral diabetes medicines. Hypoglycemia can be a life-threatening condition. Symptoms of hypoglycemia (sleepiness, dizziness, and confusion) are similar to symptoms of having too much alcohol. If your health care provider says that alcohol is safe for you, follow these guidelines:  Limit alcohol intake to no more than 1 drink per day for nonpregnant women and 2 drinks per day for men. One drink equals 12 oz of beer, 5 oz of wine, or 1 oz of hard liquor.  Do not drink on an empty stomach.  Keep yourself hydrated with water, diet soda, or unsweetened iced tea.  Keep in mind that regular soda, juice, and other mixers may contain a lot of sugar and must be counted as carbs. What  are tips for following this plan?  Reading food labels  Start by checking the serving size on the "Nutrition Facts" label of packaged foods and drinks. The amount of calories, carbs, fats, and other nutrients listed on the label is based on one serving of the item. Many items contain more than one serving per package.  Check the total grams (g) of carbs in one serving. You can calculate  the number of servings of carbs in one serving by dividing the total carbs by 15. For example, if a food has 30 g of total carbs, it would be equal to 2 servings of carbs.  Check the number of grams (g) of saturated and trans fats in one serving. Choose foods that have low or no amount of these fats.  Check the number of milligrams (mg) of salt (sodium) in one serving. Most people should limit total sodium intake to less than 2,300 mg per day.  Always check the nutrition information of foods labeled as "low-fat" or "nonfat". These foods may be higher in added sugar or refined carbs and should be avoided.  Talk to your dietitian to identify your daily goals for nutrients listed on the label. Shopping  Avoid buying canned, premade, or processed foods. These foods tend to be high in fat, sodium, and added sugar.  Shop around the outside edge of the grocery store. This includes fresh fruits and vegetables, bulk grains, fresh meats, and fresh dairy. Cooking  Use low-heat cooking methods, such as baking, instead of high-heat cooking methods like deep frying.  Cook using healthy oils, such as olive, canola, or sunflower oil.  Avoid cooking with butter, cream, or high-fat meats. Meal planning  Eat meals and snacks regularly, preferably at the same times every day. Avoid going long periods of time without eating.  Eat foods high in fiber, such as fresh fruits, vegetables, beans, and whole grains. Talk to your dietitian about how many servings of carbs you can eat at each meal.  Eat 4-6 ounces (oz) of lean protein each day, such as lean meat, chicken, fish, eggs, or tofu. One oz of lean protein is equal to: ? 1 oz of meat, chicken, or fish. ? 1 egg. ?  cup of tofu.  Eat some foods each day that contain healthy fats, such as avocado, nuts, seeds, and fish. Lifestyle  Check your blood glucose regularly.  Exercise regularly as told by your health care provider. This may include: ? 150  minutes of moderate-intensity or vigorous-intensity exercise each week. This could be brisk walking, biking, or water aerobics. ? Stretching and doing strength exercises, such as yoga or weightlifting, at least 2 times a week.  Take medicines as told by your health care provider.  Do not use any products that contain nicotine or tobacco, such as cigarettes and e-cigarettes. If you need help quitting, ask your health care provider.  Work with a Veterinary surgeon or diabetes educator to identify strategies to manage stress and any emotional and social challenges. Questions to ask a health care provider  Do I need to meet with a diabetes educator?  Do I need to meet with a dietitian?  What number can I call if I have questions?  When are the best times to check my blood glucose? Where to find more information:  American Diabetes Association: diabetes.org  Academy of Nutrition and Dietetics: www.eatright.[employer] of Diabetes and Digestive and Kidney Diseases (NIH): CarFlippers.tn Summary  A healthy meal plan will help you control  your blood glucose and maintain a healthy lifestyle.  Working with a diet and nutrition specialist (dietitian) can help you make a meal plan that is best for you.  Keep in mind that carbohydrates (carbs) and alcohol have immediate effects on your blood glucose levels. It is important to count carbs and to use alcohol carefully. This information is not intended to replace advice given to you by your health care provider. Make sure you discuss any questions you have with your health care provider. Document Revised: 10/31/2017 Document Reviewed: 12/23/2016 Elsevier Patient Education  2020 [employer].

## 2019-12-27 NOTE — Progress Notes (Signed)
Subjective:    Patient ID: Joseph [name], male    DOB: [birth year], 49 y.o.   MRN: [id number]  HPI  This visit occurred during the SARS-CoV-2 public health emergency.  Safety protocols were in place, including screening questions prior to the visit, additional usage of staff PPE, and extensive cleaning of exam room while observing appropriate contact time as indicated for disinfecting solutions.   [name] is a 49 year old male with a history of type 2 diabetes,   Current medications include: Glipizide 10 mg BID, Jardiance 10 mg, Basaglar 10 units, Metformin XR 1000 mg. He's been injecting 1 unit every morning of Basaglar instead of 10 units due to confusion.  He is checking his blood glucose 0 times daily.   Last A1C: 10.8 in September 2020, 9.4 today Last Eye Exam: He will schedule Last Foot Exam: Due  Pneumonia Vaccination: Completed in 2019 ACE/ARB: Urine microalbumin negative in June 2020 Statin: atorvastatin   BP Readings from Last 3 Encounters:  12/27/19 118/78  08/27/19 122/82  06/28/19 120/82    He is also requesting a handicap placard due to chronic knee pain. History of three surgeries to left knee, one surgery to right knee. He uses the placard infrequently but does use when knee pain increases.     Review of Systems  Respiratory: Negative for shortness of breath.   Cardiovascular: Negative for chest pain.  Musculoskeletal: Positive for arthralgias.       Chronic knee pain  Neurological: Negative for dizziness and headaches.       Past Medical History:  Diagnosis Date  . Acute encephalopathy 03/24/2018  . Anxiety   . Arthritis    oa left knee and lower back  . Asthma   . Cataract    hx of bilateral  . Chronic back pain   . Chronic pain of left knee   . COPD (chronic obstructive pulmonary disease) ([place])   . Diabetes mellitus    type 2  . Encephalopathy acute 03/24/2018  . GERD (gastroesophageal reflux disease)   . INGUINAL PAIN, LEFT 10/03/2009    Qualifier: Diagnosis of  By: Oneida Alar MD, Sandi L   . Oral thrush 11/10/2018  . Overdose    03-24-18 accidental  . Shortness of breath    with heavy exertion  . Sleep apnea      Social History   Socioeconomic History  . Marital status: Divorced    Spouse name: Not on file  . Number of children: Not on file  . Years of education: Not on file  . Highest education level: Not on file  Occupational History  . Occupation: disability  Tobacco Use  . Smoking status: Current Every Day Smoker    Packs/day: 1.00    Years: 26.00    Pack years: 26.00    Types: Cigarettes    Start date: 12/17/1993  . Smokeless tobacco: Never Used  . Tobacco comment: 1ppd as of 11/06/17 ep  Substance and Sexual Activity  . Alcohol use: Never    Alcohol/week: 0.0 standard drinks  . Drug use: Never  . Sexual activity: Yes    Birth control/protection: Surgical  Other Topics Concern  . Not on file  Social History Narrative   Recent visit to [facility] in [place] d/t increased pain where he was prescribed percocet 7.5-325 mg for pain.   Social Determinants of Health   Financial Resource Strain:   . Difficulty of Paying Living Expenses: Not on file  Food Insecurity:   .  Worried About Charity fundraiser in the Last Year: Not on file  . Ran Out of Food in the Last Year: Not on file  Transportation Needs:   . Lack of Transportation (Medical): Not on file  . Lack of Transportation (Non-Medical): Not on file  Physical Activity:   . Days of Exercise per Week: Not on file  . Minutes of Exercise per Session: Not on file  Stress:   . Feeling of Stress : Not on file  Social Connections:   . Frequency of Communication with Friends and Family: Not on file  . Frequency of Social Gatherings with Friends and Family: Not on file  . Attends Religious Services: Not on file  . Active Member of Clubs or Organizations: Not on file  . Attends Archivist Meetings: Not on file  . Marital Status: Not on file   Intimate Partner Violence:   . Fear of Current or Ex-Partner: Not on file  . Emotionally Abused: Not on file  . Physically Abused: Not on file  . Sexually Abused: Not on file    Past Surgical History:  Procedure Laterality Date  . CARPAL TUNNEL RELEASE Left   . CATARACT EXTRACTION W/PHACO Right 03/11/2016   Procedure: CATARACT EXTRACTION PHACO AND INTRAOCULAR LENS PLACEMENT (IOC);  Surgeon: Tonny Branch, MD;  Location: [facility];  Service: Ophthalmology;  Laterality: Right;  CDE 4.24  . EYE SURGERY Right 2018   ioc with lens replacement  . HERNIA REPAIR Bilateral 4696   umbilical  . KNEE ARTHROSCOPY WITH MEDIAL MENISECTOMY Left 04/22/2014   Procedure: LEFT KNEE ARTHROSCOPY WITH MEDIAL MENISECTOMY;  Surgeon: Carole Civil, MD;  Location: [facility];  Service: Orthopedics;  Laterality: Left;  . KNEE ARTHROSCOPY WITH MEDIAL MENISECTOMY Right 12/06/2015   Procedure: RIGHT KNEE ARTHROSCOPY WITH MEDIAL MENISECTOMY;  Surgeon: Carole Civil, MD;  Location: [facility];  Service: Orthopedics;  Laterality: Right;  . KNEE ARTHROSCOPY WITH MEDIAL MENISECTOMY Left 03/20/2017   Procedure: KNEE ARTHROSCOPY WITH MEDIAL MENISECTOMY;  Surgeon: Carole Civil, MD;  Location: [facility];  Service: Orthopedics;  Laterality: Left;  . ORIF WRIST FRACTURE Left 12/19/2016   Procedure: OPEN REDUCTION INTERNAL FIXATION (ORIF) WRIST FRACTURE;  Surgeon: Leanora Cover, MD;  Location: [facility];  Service: Orthopedics;  Laterality: Left;  accumed   . PARTIAL KNEE ARTHROPLASTY Left 07/21/2018   Procedure: LEFT UNICOMPARTMENTAL KNEE;  Surgeon: Renette Butters, MD;  Location: [facility];  Service: Orthopedics;  Laterality: Left;  . SHOULDER ARTHROCENTESIS Right 2008 or 2010  . VASECTOMY  2014    Family History  Problem Relation Age of Onset  . Emphysema Maternal Grandfather   . Emphysema Maternal Grandmother   . Clotting disorder Maternal Grandmother     Allergies  Allergen Reactions  . Azithromycin Hives  .  Clarithromycin   . Erythromycin Hives  . Keflex [Cephalexin] Nausea Only  . Metformin Nausea And Vomiting  . Symbicort [Budesonide-Formoterol Fumarate] Other (See Comments)    States that 3 doses were used and breathing became worse    Current Outpatient Medications on File Prior to Visit  Medication Sig Dispense Refill  . albuterol (PROVENTIL) (2.5 MG/3ML) 0.083% nebulizer solution INHALE 3 MLS VIA NEBULIZER FOUR TIMES DAILY AS NEEDED FOR SHORTNESS OF BREATH 360 mL 0  . ANDROGEL PUMP 20.25 MG/ACT (1.62%) GEL Apply 2 Pump topically daily.     Marland Kitchen atorvastatin (LIPITOR) 40 MG tablet Take 1 tablet (40 mg total) by mouth daily. For cholesterol. [place]  tablet 3  . blood glucose meter kit and supplies KIT Dispense based on patient and insurance preference. Use up to four times daily as directed. (FOR ICD-9 250.00, 250.01). 1 each 0  . buPROPion (WELLBUTRIN SR) 150 MG 12 hr tablet Take 1 tablet (150 mg total) by mouth 2 (two) times daily. 180 tablet 0  . citalopram (CELEXA) 40 MG tablet TAKE ONE (1) TABLET BY MOUTH EVERY DAY 30 tablet 9  . cyclobenzaprine (FLEXERIL) 10 MG tablet Take 10 mg by mouth 2 (two) times daily as needed.    . empagliflozin (JARDIANCE) 10 MG TABS tablet Take 10 mg by mouth every morning. For diabetes. 90 tablet 3  . fluticasone furoate-vilanterol (BREO ELLIPTA) 200-25 MCG/INH AEPB Inhale 1 puff into the lungs daily. 60 each 0  . gabapentin (NEURONTIN) 300 MG capsule TAKE 2 CAPSULES BY MOUTH TWICE A DAY 360 capsule 2  . glipiZIDE (GLUCOTROL) 10 MG tablet Take 1 tablet (10 mg total) by mouth 2 (two) times daily. For diabetes. 180 tablet 3  . Insulin Glargine (BASAGLAR KWIKPEN) 100 UNIT/ML SOPN Inject 0.1 mLs (10 Units total) into the skin daily. 15 mL 2  . Insulin Pen Needle (PEN NEEDLES) 31G X 6 MM MISC Use nightly with insulin. 100 each 2  . Ipratropium-Albuterol (COMBIVENT) 20-100 MCG/ACT AERS respimat Inhale 1 puff into the lungs every 6 (six) hours as needed for wheezing. 1  Inhaler 2  . Lancets (ONETOUCH DELICA PLUS [id number]) MISC USE TO CHECK BLOOD SUGAR UP TO FOUR TIMES DAILY 100 each 5  . metFORMIN (GLUCOPHAGE-XR) 500 MG 24 hr tablet TAKE TWO TABLETS BY MOUTH DAILY WITH BREAKFAST 180 tablet 1  . ONETOUCH VERIO test strip USE TO CHECK BLOOD SUGAR UP TO FOUR TIMES DAILY 100 strip 5  . pantoprazole (PROTONIX) 20 MG tablet Take 1 tablet (20 mg total) by mouth 2 (two) times daily before a meal. For heartburn. 180 tablet 0  . tamsulosin (FLOMAX) 0.4 MG CAPS capsule TAKE ONE CAPSULE BY MOUTH DAILY 90 capsule 1  . tapentadol HCl (NUCYNTA) 75 MG tablet Take 75 mg by mouth 2 (two) times daily.    . Turmeric 500 MG CAPS Take 1 capsule by mouth daily.    . varenicline (CHANTIX CONTINUING MONTH PAK) 1 MG tablet Take 1 tablet (1 mg total) by mouth 2 (two) times daily. 60 tablet 0  . varenicline (CHANTIX STARTING MONTH PAK) 0.5 MG X 11 & 1 MG X 42 tablet Take 0.5 mg tablet by mouth daily for 3 days, then increase to 0.5 mg twice daily for 4 days, then increase to 1 mg twice daily. 53 tablet 0  . zolpidem (AMBIEN) 10 MG tablet TAKE ONE TABLET (10MG TOTAL) BY MOUTH ATBEDTIME AS NEEDED FOR SLEEP 90 tablet 0   No current facility-administered medications on file prior to visit.    BP 118/78   Pulse 100   Temp (!) 97.4 F (36.3 C) (Temporal)   Ht _0  (1.778 m)   Wt 251 lb 12 oz (114.2 kg)   SpO2 98%   BMI 36.12 kg/m    Objective:   Physical Exam  Constitutional: He appears well-nourished.  Cardiovascular: Normal rate and regular rhythm.  Respiratory: Effort normal and breath sounds normal.  Musculoskeletal:     Cervical back: Neck supple.  Skin: Skin is warm and dry.  Psychiatric: He has a normal mood and affect.           Assessment & Plan:

## 2020-01-04 ENCOUNTER — Other Ambulatory Visit: Payer: Self-pay

## 2020-01-04 ENCOUNTER — Encounter: Payer: Self-pay | Admitting: Internal Medicine

## 2020-01-04 ENCOUNTER — Ambulatory Visit: Payer: Medicare Other | Admitting: Internal Medicine

## 2020-01-04 ENCOUNTER — Other Ambulatory Visit: Payer: Self-pay | Admitting: Primary Care

## 2020-01-04 DIAGNOSIS — J45909 Unspecified asthma, uncomplicated: Secondary | ICD-10-CM | POA: Diagnosis not present

## 2020-01-04 DIAGNOSIS — F17210 Nicotine dependence, cigarettes, uncomplicated: Secondary | ICD-10-CM

## 2020-01-04 DIAGNOSIS — K219 Gastro-esophageal reflux disease without esophagitis: Secondary | ICD-10-CM

## 2020-01-04 MED ORDER — METHYLPREDNISOLONE ACETATE 80 MG/ML IJ SUSP
120.0000 mg | Freq: Once | INTRAMUSCULAR | Status: AC
  Administered 2020-01-04: 120 mg via INTRAMUSCULAR

## 2020-01-04 MED ORDER — DULERA 200-5 MCG/ACT IN AERO: INHALATION_SPRAY | RESPIRATORY_TRACT | 11 refills | Status: DC

## 2020-01-04 MED ORDER — IPRATROPIUM-ALBUTEROL 20-100 MCG/ACT IN AERS: 1.0000 | INHALATION_SPRAY | RESPIRATORY_TRACT | 11 refills | Status: DC | PRN

## 2020-01-04 MED ORDER — BREZTRI AEROSPHERE 160-9-4.8 MCG/ACT IN AERO: 2.0000 | INHALATION_SPRAY | Freq: Two times a day (BID) | RESPIRATORY_TRACT | 0 refills | Status: DC

## 2020-01-04 NOTE — Patient Instructions (Addendum)
Stop Brep   Start dulera 200 Take 2 puffs first thing in am and then another 2 puffs about 12 hours later.   Plan A = Automatic = Always=   Dulera 200 Take 2 puffs first thing in am and then another 2 puffs about 12 hours later.   Plan B = Backup (to supplement plan A, not to replace it) Only use your combivent as a rescue medication to be used if you can't catch your breath by resting or doing a relaxed purse lip breathing pattern.  - The less you use it, the better it will work when you need it. - Ok to use the inhaler up to 1 puffs  every 4 hours if you must but call for appointment if use goes up over your usual need - Don't leave home without it !!  (think of it like the spare tire for your car)   Plan C = Crisis (instead of Plan B but only if Plan B stops working) - only use your albuterol nebulizer if you first try Plan B and it fails to help > ok to use the nebulizer up to every 4 hours but if start needing it regularly call for immediate appointment  Depomedrol 120 mg IM today   Please schedule a follow up office visit in 6 months , call sooner if needed

## 2020-01-04 NOTE — Progress Notes (Signed)
Subjective:   Patient ID: Joseph [name], male    DOB: [birth year]   MRN: [id number]    Brief patient profile:  58  yowm active smoker without significant airflow obst by PFTs 06/02/12 and 03/08/2015    History of Present Illness  In April 2013 cc  had pneumonia apparently. Went to [facility] ER and found some abnormality (nodule based on review of ER notes) on CXR that resulted in CT Angio 03/07/12 same visit. PE ruled out but found to have mediastinal nodes but the cxr nodule was not there. Apparently ER doctor said it was lymphoma and asked to see an oncologist. But primary care doctor felt that was over zealous and referred patient here. He prefers conservative mgmt to nodes if clinical suspicion for cancer is low  cc lack of energy (spent most of days yesterday in bed), esp in pollen, chronic dyspnea on exertion for 180 feet relieved by rest  But slowly progressive since 2002 for which he uses albuterol prn on average 4 times a day.  Walkt test 185 feet x 3 laps: no desaturation  He smokes at baseline; trying quit, prior intolerance (dreams) with chantix.   Only baseline mild chronic cough with associtaed wheeze that is occasional.. Does have some B symptoms like nocturnal diaphoresis (soaks pillows). Denies weight loss.   CT ANGIOGRAPHY CHEST  Comparison: 03/07/2012  An abnormal right lower paratracheal node has a short axis diameter  of 1.6 cm. An abnormal AP window lymph node has a short axis  diameter of 1.4 cm. Mild bilateral hilar and infrahilar adenopathy  noted. Scattered small axillary lymph nodes are present.  A subcarinal node has a short axis diameter of 1.5 cm.     OV 08/14/2012 Mediastinal nodes: PET scan 06/10/12 shows very low uptake in the mediastinal nodes. Suspicion for cancer is very low. Lymph nodes include 13 mm right lower paratracheal lymph node and 10 mm prevascular lymph node. ACE level 04/10/12  - is in 20s and normal  Still dyspneic: 1 flught of steps and better  with rest and with albuterol and atrovent prn. Dyspne also worse in winter and cooler weathers. Rates it as moderate. STable since last visit to slightly worse. thre is some associtaed cough. No asociated weight loss. COugh is moderate and worst early in morning and in cooler temperatues.. Denies sinus drainage. PFts show mixed picture - fvc 3.4L/69%, fev1 2.9/76%, Ratio 85 but 12% bd respose. TLC 5.7/83%. DLCO 23.7/69% rec rec #MEdiastinal node  - suspicion for cancer is low  - you will need CT chest in a year; we will schedule it later  #Smoking  - glad you are working on quitting  #Shortness of breath - this is due to asthma/copd and weight  - please start QVAR 2 puff twice daily - take sample and show technique and take prescription too  - focus on weight loss through the low glycemic diet; take diet sheet from [place]  #FOllowup  - 3 months with spirometry at followup  - flu shot and pneumovax through PMD as discussed   12/17/2013  acute ov/Joseph [name] still active smoker re: recurrent pattern of coughing x a decade intermittent / out of control since nov 2014/ can't take symbicort makes it worse / on percocet one tid at baseline  Chief Complaint  Patient presents with  . Acute Visit    MR pt.  C/o mostly nonprod cough with some thick white mucous, coughing until he almost passes out.  Went to  [facility] last week.    duoneb daily  Sometime skips the doses later in the day but always uses the am dose, sputum to purulent. Mostly sob with cough/ otherwise breaths ok Last neb was 4 h prior to OV   rec For Cough - use the flutter valve as much as you can plus mucinex dm 1200 mg every 12 hours as needed supplement with percocet up to 2 every 4hours For Breathing  Only use your duoneb  rescue medication . - Ok to use up to    every 4 hours if you must  Prednisone 10 mg take  4 each am x 2 days,   2 each am x 2 days,  1 each am x 2 days and stop  Pantoprazole (protonix) 40 mg   Take 30-60 min  before first meal of the day and Pepcid 20 mg one bedtime until return to office - this is the best way to tell whether stomach acid is contributing to your problem.  GERD  Diet . Please schedule a follow up office visit in 2 weeks, sooner if needed with all meds in hand    07/27/2014 f/u ov/Joseph [name] re: asthma / still smoking  Chief Complaint  Patient presents with  . Follow-up    Pt c/o increased SOB and cough for the past month- cough is occ prod with minimal clear sputum. Pt currently taking clindamycin for staph infection. Rarely using albuterol inhaler, but is using neb approx 4 times per day.   off dulera x 1.5 week denies more sob/ cough or need for rescue on vs off rec Plan A=  dulera 200 Take 2 puffs first thing in am and then another 2 puffs about 12 hours later (or formulary alternative- call us if you find a cheaper one)  Plan B =  Backup = proaire  Plan C = Backup plan B = nebulized albuterol up to 4 hours but call if needing this much at all The key is to stop smoking completely before smoking completely stops you!    11/29/2014 f/u ov/Joseph [name] re: s/p "aecopd" still smoking  Chief Complaint  Patient presents with  . Follow-up    Pt states that his SOB and cough have been worse for the past several wks. He went to ED for COPD exacerbation on 11/14/14.  He is using proair 3-4 times per day and neb about 3 times per day on average.   one month prior to flare needing proair 1-2 per day and neb at least once or twice also - using neb first thing instead of dulera  Better p pred from er then worse again w/in a week of taper off assoc with congested cough but mostly white mucus esp in ams rec Think of your respiratory medications as multiple steps you can take to control your symptoms and avoid having to go to the ER Plan A is your maintenance daily no matter what meds: dulera Take 2 puffs first thing in am and then another 2 puffs about 12 hours later.  Plan B only use after you've used  your maintenance (Plan A) medication, and only if you can't catch your breath: Proair 2 every 4 hours as needed  Plan C only use after you've used plan A and B and still can't catch your breath: nebulizer albuterol 2.5 mg up to every 4 hours  Plan D(for Doctor):  If you've used A thru C and not doing a lot better or still needing C more  than a once a day,  D = call the doctor for evaluation asap Plan E (for ER):  If still not able to catch your breath, even after using your nebulizer up to every 4 hours, go to ER  For cough > delsym 2 tsp every 12 hours is the best you can buy Prevacid 30 mg Take 30-60 min before first meal of the day  GERD diet      03/08/2015 f/u ov/Joseph [name] re: still smoking / off dulera x 48 h and off saba hfa/ using neb 8 am of test /no change in symptoms or need for saba on or off dulera  Chief Complaint  Patient presents with  . Follow-up    PFT done today. Pt states that his breathing is about the same since last visit. He is using albuterol inhaler approx 3 x per day.   breathing does better if stays in house,  worse out in cold Even on dulera still using neb at least twice daily  Some sporadic dry fits of  cough with bending over even on dulera but not using ppi correctly  rec Prevacid 30 mg should be to Take 30- 60 min before your first and last meals of the day  I see no evidence that the dulera is helping you and would stop it at this point pending an asthma challenge test  Please see patient coordinator before you leave today  to schedule Methacholine test - do not take any albuterol with in 6 hours of the test> never done     10/03/2015  f/u ov/Joseph [name] re: still smoking /  ? asthma vs all vcd/ gerd  Chief Complaint  Patient presents with  . Follow-up    Pt states his breathing is overall doing well. He is using rescue inhaler and neb several times per day.   Wife left him in July 2016 and losing wt, dm better, breathing not since stopped dulera and never went for  mct and can't afford it  No noct symptoms after hs saba/ cpap  saba use up when no access to dulera 200 but even on it he continues to perceive need for saba daily despite nl exams/ pfts rec Please see patient coordinator before you leave today  to schedule a methacholine challenge test>  POSITIVE Ok to resume dulera but stop it 24 hours before the methacholine and no albuterol w/in 6 hours  Please schedule a follow up office visit in 4 weeks, sooner if needed     10/31/2015  f/u ov/Joseph [name] re: mild persistent asthma  Chief Complaint  Patient presents with  . Follow-up    Breathing has been worse since decrease in [place].  He is using rescue inhaler 4-5 x per day and duoneb 2 x per day.   Noted a change on the dulera 100 but can't afford dulera in any form/ reliant on samples rec Increase the dulera 200 Take 2 puffs first thing in am and then another 2 puffs about 12 hours later.  Only use your albuterol (proair) as a rescue medication   Only use the nebulizer if you try the Proair first and it doesn't work    12/12/2015  f/u ov/Joseph [name] re: intrinsic asthma  Doing fine as long as stays out of cold and on dulera 200 despite still smoking   Chief Complaint  Patient presents with  . Follow-up    Breathing has improved some. He is using rescue inhaler "just when I go outside". He uses neb  5 x per wk on average.   rec No change in medications Keep working on getting rid of the cigarettes before they get rid of you   NP ov 05/07/17  We will give you samples of BREO today. Use this 1 puff once daily Use the Breo in place of [place].   05/23/2017  f/u ov/Joseph [name] re: intrinsic asthma/ BREO 200 / prn neb  Chief Complaint  Patient presents with  . Follow-up    Breathing has improved on Breo. He has only had to use neb x 3 since last visit. He does not currently have a rescue inhaler.   Not limited by breathing from desired activities  But not very active rec Plan A = Automatic = BREO 200 one  each am  Work on inhaler technique:    Plan B = Backup Only use your  combivent respimat  as a rescue medication  Plan C = Crisis - only use your albuterol nebulizer if you first try Plan B     11/10/2018  f/u ov/Joseph [name] re: asthma/ still smoking  Chief Complaint  Patient presents with  . Follow-up    Breathing is overall doing well. He states when it is really cold out he uses his neb more- approx 5 x per wk. He does not use his combivent.   Dyspnea:  Fine as long as remembers to use breo 200 and it's not real cold outside, does fine in heat  Cough: none Sleeping: on cpap per [place]  s concerns SABA use: only on cold days then uses neb up to 3 x in a day if out it in, lasts "a few hours"  02: none   Thinking about hypnosis for cig smoking/ could not tol chantix rec Plan A = Automatic = BREO 200 one each am  Plan B = Backup Only use your  combivent respimat    Plan C = Crisis - only use your albuterol nebulizer if you first try Plan B and it fails to help > ok to use the nebulizer up to every 4 hours but if start needing it regularly call for immediate appointment   01/04/2020  f/u ov/Joseph [name] re: smoker / ab  Chief Complaint  Patient presents with  . Follow-up    Breathing has been worse lately- relates to colder weather. He is using combivent 2 x per wk and neb about 2 x per day.   Dyspnea:  Just when coughing to point of gagging  Cough: esp with cold weather exposure despite Breo 200 ? Better on dulera 200  Sleeping: on cpap fine  SABA use: way too much 02: no    No obvious day to day or daytime variability or assoc excess/ purulent sputum or mucus plugs or hemoptysis or cp or chest tightness, subjective wheeze or overt sinus or hb symptoms.   Sleeping on cpap  without nocturnal  or early am exacerbation  of respiratory  c/o's or need for noct saba. Also denies any obvious fluctuation of symptoms with weather or environmental changes or other aggravating or alleviating factors  except as outlined above   No unusual exposure hx or h/o childhood pna/ asthma or knowledge of premature birth.  Current Allergies, Complete Past Medical History, Past Surgical History, Family History, and Social History were reviewed in [employer] record.  ROS  The following are not active complaints unless bolded Hoarseness, sore throat, dysphagia, dental problems, itching, sneezing,  nasal congestion or discharge of excess mucus or purulent  secretions, ear ache,   fever, chills, sweats, unintended wt loss or wt gain, classically pleuritic or exertional cp,  orthopnea pnd or arm/hand swelling  or leg swelling, presyncope, palpitations, abdominal pain, anorexia, nausea, vomiting, diarrhea  or change in bowel habits or change in bladder habits, change in stools or change in urine, dysuria, hematuria,  rash, arthralgias, visual complaints, headache, numbness, weakness or ataxia or problems with walking or coordination,  change in mood or  memory.        Current Meds  Medication Sig  . albuterol (PROVENTIL) (2.5 MG/3ML) 0.083% nebulizer solution INHALE 3 MLS VIA NEBULIZER FOUR TIMES DAILY AS NEEDED FOR SHORTNESS OF BREATH  . ANDROGEL PUMP 20.25 MG/ACT (1.62%) GEL Apply 2 Pump topically daily.   Marland Kitchen atorvastatin (LIPITOR) 40 MG tablet Take 1 tablet (40 mg total) by mouth daily. For cholesterol.  . blood glucose meter kit and supplies KIT Dispense based on patient and insurance preference. Use up to four times daily as directed. (FOR ICD-9 250.00, 250.01).  Marland Kitchen buPROPion (WELLBUTRIN SR) 150 MG 12 hr tablet Take 1 tablet (150 mg total) by mouth 2 (two) times daily.  . citalopram (CELEXA) 40 MG tablet TAKE ONE (1) TABLET BY MOUTH EVERY DAY  . cyclobenzaprine (FLEXERIL) 10 MG tablet Take 10 mg by mouth 2 (two) times daily as needed.  . empagliflozin (JARDIANCE) 10 MG TABS tablet Take 10 mg by mouth every morning. For diabetes.  . fluticasone furoate-vilanterol (BREO ELLIPTA) 200-25  MCG/INH AEPB Inhale 1 puff into the lungs daily.  Marland Kitchen gabapentin (NEURONTIN) 300 MG capsule TAKE 2 CAPSULES BY MOUTH TWICE A DAY  . glipiZIDE (GLUCOTROL) 10 MG tablet Take 1 tablet (10 mg total) by mouth 2 (two) times daily. For diabetes.  . Insulin Glargine (BASAGLAR KWIKPEN) 100 UNIT/ML SOPN Inject 0.1 mLs (10 Units total) into the skin daily.  . Insulin Pen Needle (PEN NEEDLES) 31G X 6 MM MISC Use nightly with insulin.  . Ipratropium-Albuterol (COMBIVENT) 20-100 MCG/ACT AERS respimat Inhale 1 puff into the lungs every 6 (six) hours as needed for wheezing.  . Lancets (ONETOUCH DELICA PLUS [id number]) MISC USE TO CHECK BLOOD SUGAR UP TO FOUR TIMES DAILY  . metFORMIN (GLUCOPHAGE-XR) 500 MG 24 hr tablet TAKE TWO TABLETS BY MOUTH DAILY WITH BREAKFAST  . ONETOUCH VERIO test strip USE TO CHECK BLOOD SUGAR UP TO FOUR TIMES DAILY  . pantoprazole (PROTONIX) 20 MG tablet TAKE 1 TABLET(20 MG) BY MOUTH TWICE DAILY BEFORE A MEAL FOR HEARTBURN  . tamsulosin (FLOMAX) 0.4 MG CAPS capsule TAKE ONE CAPSULE BY MOUTH DAILY  . tapentadol HCl (NUCYNTA) 75 MG tablet Take 75 mg by mouth 2 (two) times daily.  . Turmeric 500 MG CAPS Take 1 capsule by mouth daily.  . varenicline (CHANTIX CONTINUING MONTH PAK) 1 MG tablet Take 1 tablet (1 mg total) by mouth 2 (two) times daily.  . varenicline (CHANTIX STARTING MONTH PAK) 0.5 MG X 11 & 1 MG X 42 tablet Take 0.5 mg tablet by mouth daily for 3 days, then increase to 0.5 mg twice daily for 4 days, then increase to 1 mg twice daily.  Marland Kitchen zolpidem (AMBIEN) 10 MG tablet TAKE ONE TABLET ('10MG'$  TOTAL) BY MOUTH ATBEDTIME AS NEEDED FOR SLEEP                        Objective:   Physical Exam   amb obese wm nad   12/31/2013  294  >  07/27/2014  286 > 11/29/2014   289> 03/08/2015  284 > 10/03/2015 266 > 10/31/2015 273 > 12/12/2015    280 > 05/23/2017  245 > 11/10/2018  245 > 01/04/2020  251   Vital signs reviewed  01/04/2020  - Note at rest 02 sats  97% on RA       HEENT : pt  wearing mask not removed for exam due to covid -19 concerns.    NECK :  without JVD/Nodes/TM/ nl carotid upstrokes bilaterally   LUNGS: no acc muscle use,  Nl contour chest which is clear to A and P bilaterally without cough on insp or exp maneuvers   CV:  RRR  no s3 or murmur or increase in P2, and no edema   ABD:  Obese soft and nontender with nl inspiratory excursion in the supine position. No bruits or organomegaly appreciated, bowel sounds nl  MS:  Nl gait/ ext warm without deformities, calf tenderness, cyanosis or clubbing No obvious joint restrictions   SKIN: warm and dry without lesions    NEURO:  alert, approp, nl sensorium with  no motor or cerebellar deficits apparent.            Assessment & Plan:

## 2020-01-05 ENCOUNTER — Encounter: Payer: Self-pay | Admitting: Internal Medicine

## 2020-01-05 NOTE — Assessment & Plan Note (Addendum)
Active smoker - PFT's s sign copd 06/2012 - med calendar 2/13 /15 > not using 11/29/14  - 11/29/2014 p extensive coaching HFA effectiveness =    90% from a baseline of 50%  -PFTs wnl 03/08/2015 4 h p alb neb> rec MCT at least 6 h p neb > done 10/12/2015  POS > resume dulera 100 2bid - 10/31/2015    increase dulera to 200 bid (samples provided)  - 05/23/2017  BREO 200 rx initiated > much better  - Spirometry 11/10/2018  FEV1 3.0 (76%)  Ratio 84 s curvature after am BREO 200  - 01/04/2020  After extensive coaching inhaler device,  effectiveness =    90% > change back to dulera 200 2bid due to severe cough on breo and overuse of saba   Very poorly controlled based on saba use.   DDX of  difficult airways management almost all start with A and  include Adherence, Ace Inhibitors, Acid Reflux, Active Sinus Disease, Alpha 1 Antitripsin deficiency, Anxiety masquerading as Airways dz,  ABPA,  Allergy(esp in young), Aspiration (esp in elderly), Adverse effects of meds,  Active smoking or vaping, A bunch of PE's (a small clot burden can't cause this syndrome unless there is already severe underlying pulm or vascular dz with poor reserve) plus two Bs  = Bronchiectasis and Beta blocker use..and one C= CHF   Adherence is always the initial "prime suspect" and is a multilayered concern that requires a "trust but verify" approach in every patient - starting with knowing how to use medications, especially inhalers, correctly, keeping up with refills and understanding the fundamental difference between maintenance and prns vs those medications only taken for a very short course and then stopped and not refilled.  - see hfa teaching  -  Advised: albuterol is a "rescue medication"  for relief of breathlessness  that does not improve by walking a slower pace or resting  for a few minutes (it doesn't really start working for 5 minutes anyway so ok to wait to see if resting helps)  However, if you are convinced, as many are,  that albuterol  helps recover from activity faster then it's easy to tell if this is the case by re-challenging : stop, take the inhaler, then 5 minutes later try the exact same activity (intensity of workload) that just caused the symptoms and see if they are better by using the albuterol prior to exertion.  If  there is an activity that reproducibly causes the breathing problem every time you attempt it, try the albuterol  (either inhaler or nebulizer)  15 min before the activity on alternate days to see if there really is a difference and let me know what you find.   Active smoking > see a/p sep  ? Adverse effects of dpi > change to dulera 200 (says can't take symbicort but took dulera p symb and tol fine until insurance stopped paying for it) - Advised:  formulary restrictions will be an ongoing challenge for the forseable future and I would be happy to pick an alternative if the pt will first  provide me a list of them -  pt  will need to return here for training for any new device that is required eg dpi vs hfa vs respimat.    In the meantime we can always provide samples so that the patient never runs out of any needed respiratory medications.    ? Acid (or non-acid) GERD > always difficult to exclude as  up to 75% of pts in some series report no assoc GI/ Heartburn symptoms> rec continue  (24h)  acid suppression and diet restrictions/ reviewed   - consider higher dose of ppi if cough continues  ? Anxiety > usually at the bottom of this list of usual suspects but should be much higher on this pt's based on H and P and note already on psychotropics and may interfere with adherence and also interpretation of response or lack thereof to symptom management which can be quite subjective.   ? Allergy > doubt, give just one dose depomedrol 120 mg IM          Each maintenance medication was reviewed in detail including emphasizing most importantly the difference between maintenance and prns and  under what circumstances the prns are to be triggered using an action plan format where appropriate.  Total time for H and P, chart review, counseling, teaching device and generating customized AVS unique to this office visit / charting = 30 min

## 2020-01-05 NOTE — Assessment & Plan Note (Signed)
Complicated by aodm/ osa /gerd   Body mass index is 36.1 kg/m.  -  trending up Lab Results  Component Value Date   TSH 1.700 08/02/2016     Contributing to gerd risk/ doe/reviewed the need and the process to achieve and maintain neg calorie balance > defer f/u primary care including intermittently monitoring thyroid status

## 2020-01-05 NOTE — Assessment & Plan Note (Addendum)
Counseled re importance of smoking cessation but did not meet time criteria for separate billing  / plans to start chantix per pcp

## 2020-01-06 ENCOUNTER — Other Ambulatory Visit: Payer: Self-pay | Admitting: *Deleted

## 2020-01-06 MED ORDER — ALBUTEROL SULFATE (2.5 MG/3ML) 0.083% IN NEBU: INHALATION_SOLUTION | RESPIRATORY_TRACT | 5 refills | Status: DC

## 2020-01-14 ENCOUNTER — Other Ambulatory Visit: Payer: Self-pay | Admitting: Primary Care

## 2020-01-14 DIAGNOSIS — G4700 Insomnia, unspecified: Secondary | ICD-10-CM

## 2020-01-14 DIAGNOSIS — M25562 Pain in left knee: Secondary | ICD-10-CM

## 2020-01-14 DIAGNOSIS — G8929 Other chronic pain: Secondary | ICD-10-CM

## 2020-01-14 NOTE — Telephone Encounter (Signed)
Noted, refill sent to pharmacy. 

## 2020-01-14 NOTE — Telephone Encounter (Signed)
Last prescribed on 10/15/2019 . Last appointment on 12/27/2019. Next future appointment on 03/27/2020

## 2020-01-22 ENCOUNTER — Other Ambulatory Visit: Payer: Self-pay | Admitting: Internal Medicine

## 2020-01-22 DIAGNOSIS — J449 Chronic obstructive pulmonary disease, unspecified: Secondary | ICD-10-CM

## 2020-02-08 MED ORDER — FREESTYLE LIBRE 2 SENSOR MISC: 1.0000 | Freq: Every day | 5 refills | Status: DC

## 2020-02-08 MED ORDER — FREESTYLE LIBRE 14 DAY SENSOR MISC: 5 refills | Status: DC

## 2020-02-08 MED ORDER — FREESTYLE LIBRE 2 READER DEVI: 1.0000 | Freq: Every day | 0 refills | Status: DC

## 2020-02-08 MED ORDER — FREESTYLE LIBRE 14 DAY READER DEVI: 1.0000 | Freq: Every day | 0 refills | Status: DC

## 2020-02-22 ENCOUNTER — Encounter: Payer: Self-pay | Admitting: Internal Medicine

## 2020-02-22 ENCOUNTER — Ambulatory Visit (INDEPENDENT_AMBULATORY_CARE_PROVIDER_SITE_OTHER): Payer: Medicare HMO | Admitting: Internal Medicine

## 2020-02-22 DIAGNOSIS — J329 Chronic sinusitis, unspecified: Secondary | ICD-10-CM | POA: Diagnosis not present

## 2020-02-22 DIAGNOSIS — B9789 Other viral agents as the cause of diseases classified elsewhere: Secondary | ICD-10-CM | POA: Diagnosis not present

## 2020-02-22 MED ORDER — CETIRIZINE HCL 10 MG PO TABS: 10.0000 mg | ORAL_TABLET | Freq: Every day | ORAL | 0 refills | Status: DC

## 2020-02-22 MED ORDER — FLUTICASONE PROPIONATE 50 MCG/ACT NA SUSP: 2.0000 | Freq: Every day | NASAL | 0 refills | Status: DC

## 2020-02-22 NOTE — Patient Instructions (Signed)

## 2020-02-22 NOTE — Progress Notes (Signed)
Virtual Visit via Video Note  I connected with Joseph [name] on 95/09/32 at  2:00 PM EDT by a video enabled telemedicine application and verified that I am speaking with the correct person using two identifiers.  Location: Patient: Home Provider: Office   I discussed the limitations of evaluation and management by telemedicine and the availability of in person appointments. The patient expressed understanding and agreed to proceed.  HPI  Pt reports facial pressure, nasal congestion and cough. This stated 3-4 days ago. He is not blowing much out of his nose. The cough is productive at times but he is unsure of the color. He denies headache, runny nose, ear pain, sore throat, or SOB. He denies fever, chills or body aches. He has tried Benadryl, Dulera and neb treatments with some relief. He denies sick contacts or exposure to Covid that he is aware of.  Review of Systems     Past Medical History:  Diagnosis Date  . Acute encephalopathy 03/24/2018  . Anxiety   . Arthritis    oa left knee and lower back  . Asthma   . Cataract    hx of bilateral  . Chronic back pain   . Chronic pain of left knee   . COPD (chronic obstructive pulmonary disease) ([place])   . Diabetes mellitus    type 2  . Encephalopathy acute 03/24/2018  . GERD (gastroesophageal reflux disease)   . INGUINAL PAIN, LEFT 10/03/2009   Qualifier: Diagnosis of  By: Oneida Alar MD, Sandi L   . Oral thrush 11/10/2018  . Overdose    03-24-18 accidental  . Shortness of breath    with heavy exertion  . Sleep apnea     Family History  Problem Relation Age of Onset  . Emphysema Maternal Grandfather   . Emphysema Maternal Grandmother   . Clotting disorder Maternal Grandmother     Social History   Socioeconomic History  . Marital status: Divorced    Spouse name: Not on file  . Number of children: Not on file  . Years of education: Not on file  . Highest education level: Not on file  Occupational History  . Occupation:  disability  Tobacco Use  . Smoking status: Current Every Day Smoker    Packs/day: 1.00    Years: 26.00    Pack years: 26.00    Types: Cigarettes    Start date: 12/17/1993  . Smokeless tobacco: Never Used  . Tobacco comment: 1 ppd 01/04/2020  Substance and Sexual Activity  . Alcohol use: Never    Alcohol/week: 0.0 standard drinks  . Drug use: Never  . Sexual activity: Yes    Birth control/protection: Surgical  Other Topics Concern  . Not on file  Social History Narrative   Recent visit to [facility] in [place] d/t increased pain where he was prescribed percocet 7.5-325 mg for pain.   Social Determinants of Health   Financial Resource Strain:   . Difficulty of Paying Living Expenses:   Food Insecurity:   . Worried About Charity fundraiser in the Last Year:   . Arboriculturist in the Last Year:   Transportation Needs:   . Film/video editor (Medical):   Marland Kitchen Lack of Transportation (Non-Medical):   Physical Activity:   . Days of Exercise per Week:   . Minutes of Exercise per Session:   Stress:   . Feeling of Stress :   Social Connections:   . Frequency of Communication with Friends and Family:   .  Frequency of Social Gatherings with Friends and Family:   . Attends Religious Services:   . Active Member of Clubs or Organizations:   . Attends Banker Meetings:   Marland Kitchen Marital Status:   Intimate Partner Violence:   . Fear of Current or Ex-Partner:   . Emotionally Abused:   Marland Kitchen Physically Abused:   . Sexually Abused:     Allergies  Allergen Reactions  . Azithromycin Hives  . Clarithromycin   . Erythromycin Hives  . Keflex [Cephalexin] Nausea Only  . Metformin Nausea And Vomiting  . Symbicort [Budesonide-Formoterol Fumarate] Other (See Comments)    States that 3 doses were used and breathing became worse     Constitutional:  Denies headache, fatigue, fever or abrupt weight changes.  HEENT:  Positive efacial pain, nasal congestion . Denies eye redness, ear pain,  ringing in the ears, wax buildup, runny nose or bloody nose. Respiratory: Positive cough. Denies difficulty breathing or shortness of breath.  Cardiovascular: Denies chest pain, chest tightness, palpitations or swelling in the hands or feet.   No other specific complaints in a complete review of systems (except as listed in HPI above).  Objective:    General: Appears his stated age, obese, in NAD. HEENT: Head: normal shape and size; Nose: no congestion noted; Throat/Mouth: no hoarseness noted.  Pulmonary/Chest: Normal effort. No respiratory distress.       Assessment & Plan:   Viral Sinusitis  Can use a Neti Pot which can be purchased from your local drug store. RX for Flonase 2 sprays each nostril for 3 days and then as needed. RX for Zyrtec QHS at bedtime  RTC as needed or if symptoms persist. Nicki Reaper, NP    Follow Up Instructions:    I discussed the assessment and treatment plan with the patient. The patient was provided an opportunity to ask questions and all were answered. The patient agreed with the plan and demonstrated an understanding of the instructions.   The patient was advised to call back or seek an in-person evaluation if the symptoms worsen or if the condition fails to improve as anticipated.   Nicki Reaper, NP

## 2020-02-24 ENCOUNTER — Encounter: Payer: Self-pay | Admitting: Internal Medicine

## 2020-02-25 MED ORDER — AMOXICILLIN-POT CLAVULANATE 875-125 MG PO TABS: 1.0000 | ORAL_TABLET | Freq: Two times a day (BID) | ORAL | 0 refills | Status: DC

## 2020-03-02 NOTE — Telephone Encounter (Signed)
Patient called to schedule virtual visit for sinus infection and sore throat, Schedule with Dr Selena Batten on Monday, patient would like to know if you think he should wait that long or go to an UC for Eval.

## 2020-03-02 NOTE — Telephone Encounter (Signed)
Called Patient, advised of Kate's message below Patient voiced understanding and will go to UC for evaluation

## 2020-03-02 NOTE — Telephone Encounter (Signed)
Patient needs to go to urgent care for an in person evaluation.  Virtual visit will not be very effective.

## 2020-03-06 ENCOUNTER — Ambulatory Visit: Payer: Medicare HMO | Admitting: Family Medicine

## 2020-03-22 ENCOUNTER — Other Ambulatory Visit: Payer: Self-pay | Admitting: Primary Care

## 2020-03-22 DIAGNOSIS — E119 Type 2 diabetes mellitus without complications: Secondary | ICD-10-CM

## 2020-03-24 ENCOUNTER — Telehealth: Payer: Self-pay | Admitting: Internal Medicine

## 2020-03-24 NOTE — Telephone Encounter (Signed)
Called and spoke with pt stating that an rx was sent to pharmacy for him on 2/2 after last OV. Pt stated it was not a refill of the rx that he needed. He stated that pharmacy told him that a PA was required as insurance is not wanting to pay for his med. Stated to pt that we would get this taken care of for him.  Medication name and strength: Dulera 200 Provider: Sherene Sires Pharmacy: [facility] Patient insurance ID: [id number] Phone: [phone] Fax: [phone]  Was the PA started on CMM?  yes If yes, please enter the Key: [id number] Timeframe for approval/denial: Your information has been submitted to [facility]. [employer] will review the request and will issue a decision, typically within 3-7 days from your submission. You can check the updated outcome later by reopening this request.  If [employer] has not responded in 3-7 days or if you have any questions about your ePA request, please contact [employer] at [phone]. If you think there may be a problem with your PA request, use our live chat feature at the bottom right.    Will leave encounter open and in triage so we can check on status.

## 2020-03-27 ENCOUNTER — Ambulatory Visit: Payer: Medicare HMO | Admitting: Primary Care

## 2020-03-27 DIAGNOSIS — Z0289 Encounter for other administrative examinations: Secondary | ICD-10-CM

## 2020-03-27 NOTE — Telephone Encounter (Signed)
Checked Cover My Meds. PA has been approved through 12/01/20. ATC pt, there was no answer and I could not leave a message. Will try back.

## 2020-03-28 NOTE — Telephone Encounter (Signed)
ATC patient LMTCB X2 

## 2020-03-29 NOTE — Telephone Encounter (Signed)
ATC patient LMTCB x3 per protocol I will close this message.

## 2020-04-03 ENCOUNTER — Telehealth: Payer: Self-pay | Admitting: Primary Care

## 2020-04-03 NOTE — Progress Notes (Signed)
  Chronic Care Management   Outreach Note  04/03/2020 Name: Joseph [name] MRN: [id number] DOB: May 06, 1971  Referred by: Doreene Nest, NP Reason for referral : No chief complaint on file.   An unsuccessful telephone outreach was attempted today. The patient was referred to the pharmacist for assistance with care management and care coordination.    This note is not being shared with the patient for the following reason: To respect privacy (The patient or proxy has requested that the information not be shared).  Follow Up Plan:   [name] [employer] Scheduler

## 2020-04-07 ENCOUNTER — Other Ambulatory Visit: Payer: Self-pay | Admitting: Primary Care

## 2020-04-14 ENCOUNTER — Telehealth: Payer: Self-pay | Admitting: Primary Care

## 2020-04-14 DIAGNOSIS — M25562 Pain in left knee: Secondary | ICD-10-CM

## 2020-04-14 DIAGNOSIS — G4700 Insomnia, unspecified: Secondary | ICD-10-CM

## 2020-04-14 NOTE — Telephone Encounter (Signed)
Patient contacted via phone, PA for [facility] approved, patient has filled Dulera 200 as ordered without difficulty. Approval letter from [facility] scanned into chart.

## 2020-04-15 NOTE — Telephone Encounter (Signed)
Last filled 12-27-19 #90 Last OV 12-27-19 (Acute 02-22-20) No Future OV [facility]

## 2020-04-17 NOTE — Telephone Encounter (Signed)
Patient due in July for CPE, please schedule.

## 2020-04-24 NOTE — Telephone Encounter (Signed)
Pt is scheduled for a physical on 8/4

## 2020-05-03 ENCOUNTER — Ambulatory Visit (INDEPENDENT_AMBULATORY_CARE_PROVIDER_SITE_OTHER): Payer: Medicare HMO | Admitting: Primary Care

## 2020-05-03 ENCOUNTER — Other Ambulatory Visit: Payer: Self-pay

## 2020-05-03 ENCOUNTER — Encounter: Payer: Self-pay | Admitting: Primary Care

## 2020-05-03 VITALS — BP 120/80 | HR 106 | Ht 70.0 in | Wt 250.0 lb

## 2020-05-03 DIAGNOSIS — E785 Hyperlipidemia, unspecified: Secondary | ICD-10-CM | POA: Diagnosis not present

## 2020-05-03 DIAGNOSIS — E119 Type 2 diabetes mellitus without complications: Secondary | ICD-10-CM

## 2020-05-03 DIAGNOSIS — R5382 Chronic fatigue, unspecified: Secondary | ICD-10-CM

## 2020-05-03 DIAGNOSIS — J302 Other seasonal allergic rhinitis: Secondary | ICD-10-CM

## 2020-05-03 LAB — POCT GLYCOSYLATED HEMOGLOBIN (HGB A1C): Hemoglobin A1C: 9.6 % — AB (ref 4.0–5.6)

## 2020-05-03 LAB — MICROALBUMIN / CREATININE URINE RATIO
Creatinine,U: 38 mg/dL
Microalb Creat Ratio: 1.8 mg/g (ref 0.0–30.0)
Microalb, Ur: <0.7 mg/dL (ref 0.0–1.9)

## 2020-05-03 MED ORDER — CETIRIZINE HCL 10 MG PO TABS: 10.0000 mg | ORAL_TABLET | Freq: Every day | ORAL | 1 refills | Status: DC

## 2020-05-03 MED ORDER — BASAGLAR KWIKPEN 100 UNIT/ML ~~LOC~~ SOPN: 15.0000 [IU] | PEN_INJECTOR | Freq: Every day | SUBCUTANEOUS | 2 refills | Status: DC

## 2020-05-03 NOTE — Patient Instructions (Signed)
Start checking your blood sugar levels.  Appropriate times to check your blood sugar levels are:  -Before any meal (breakfast, lunch, dinner) -Two hours after any meal (breakfast, lunch, dinner) -Bedtime  Send me readings via my chart in 2 weeks.  We've increased your basaglar to 15 units. Continue Jardiance, metformin, glipizide.  Start exercising. You should be getting 150 minutes of moderate intensity exercise weekly.  It is important that you improve your diet. Please limit carbohydrates in the form of white bread, rice, pasta, sweets, fast food, fried food, sugary drinks, etc. Increase your consumption of fresh fruits and vegetables, whole grains, lean protein.  Ensure you are consuming 64 ounces of water daily.  Please schedule a follow up visit for 6 weeks for diabetes check.  It was a pleasure to see you today!   Diabetes Mellitus and Nutrition, Adult When you have diabetes (diabetes mellitus), it is very important to have healthy eating habits because your blood sugar (glucose) levels are greatly affected by what you eat and drink. Eating healthy foods in the appropriate amounts, at about the same times every day, can help you:  Control your blood glucose.  Lower your risk of heart disease.  Improve your blood pressure.  Reach or maintain a healthy weight. Every person with diabetes is different, and each person has different needs for a meal plan. Your health care provider may recommend that you work with a diet and nutrition specialist (dietitian) to make a meal plan that is best for you. Your meal plan may vary depending on factors such as:  The calories you need.  The medicines you take.  Your weight.  Your blood glucose, blood pressure, and cholesterol levels.  Your activity level.  Other health conditions you have, such as heart or kidney disease. How do carbohydrates affect me? Carbohydrates, also called carbs, affect your blood glucose level more than  any other type of food. Eating carbs naturally raises the amount of glucose in your blood. Carb counting is a method for keeping track of how many carbs you eat. Counting carbs is important to keep your blood glucose at a healthy level, especially if you use insulin or take certain oral diabetes medicines. It is important to know how many carbs you can safely have in each meal. This is different for every person. Your dietitian can help you calculate how many carbs you should have at each meal and for each snack. Foods that contain carbs include:  Bread, cereal, rice, pasta, and crackers.  Potatoes and corn.  Peas, beans, and lentils.  Milk and yogurt.  Fruit and juice.  Desserts, such as cakes, cookies, ice cream, and candy. How does alcohol affect me? Alcohol can cause a sudden decrease in blood glucose (hypoglycemia), especially if you use insulin or take certain oral diabetes medicines. Hypoglycemia can be a life-threatening condition. Symptoms of hypoglycemia (sleepiness, dizziness, and confusion) are similar to symptoms of having too much alcohol. If your health care provider says that alcohol is safe for you, follow these guidelines:  Limit alcohol intake to no more than 1 drink per day for nonpregnant women and 2 drinks per day for men. One drink equals 12 oz of beer, 5 oz of wine, or 1 oz of hard liquor.  Do not drink on an empty stomach.  Keep yourself hydrated with water, diet soda, or unsweetened iced tea.  Keep in mind that regular soda, juice, and other mixers may contain a lot of sugar and must be  counted as carbs. What are tips for following this plan?  Reading food labels  Start by checking the serving size on the "Nutrition Facts" label of packaged foods and drinks. The amount of calories, carbs, fats, and other nutrients listed on the label is based on one serving of the item. Many items contain more than one serving per package.  Check the total grams (g) of carbs  in one serving. You can calculate the number of servings of carbs in one serving by dividing the total carbs by 15. For example, if a food has 30 g of total carbs, it would be equal to 2 servings of carbs.  Check the number of grams (g) of saturated and trans fats in one serving. Choose foods that have low or no amount of these fats.  Check the number of milligrams (mg) of salt (sodium) in one serving. Most people should limit total sodium intake to less than 2,300 mg per day.  Always check the nutrition information of foods labeled as "low-fat" or "nonfat". These foods may be higher in added sugar or refined carbs and should be avoided.  Talk to your dietitian to identify your daily goals for nutrients listed on the label. Shopping  Avoid buying canned, premade, or processed foods. These foods tend to be high in fat, sodium, and added sugar.  Shop around the outside edge of the grocery store. This includes fresh fruits and vegetables, bulk grains, fresh meats, and fresh dairy. Cooking  Use low-heat cooking methods, such as baking, instead of high-heat cooking methods like deep frying.  Cook using healthy oils, such as olive, canola, or sunflower oil.  Avoid cooking with butter, cream, or high-fat meats. Meal planning  Eat meals and snacks regularly, preferably at the same times every day. Avoid going long periods of time without eating.  Eat foods high in fiber, such as fresh fruits, vegetables, beans, and whole grains. Talk to your dietitian about how many servings of carbs you can eat at each meal.  Eat 4-6 ounces (oz) of lean protein each day, such as lean meat, chicken, fish, eggs, or tofu. One oz of lean protein is equal to: ? 1 oz of meat, chicken, or fish. ? 1 egg. ?  cup of tofu.  Eat some foods each day that contain healthy fats, such as avocado, nuts, seeds, and fish. Lifestyle  Check your blood glucose regularly.  Exercise regularly as told by your health care  provider. This may include: ? 150 minutes of moderate-intensity or vigorous-intensity exercise each week. This could be brisk walking, biking, or water aerobics. ? Stretching and doing strength exercises, such as yoga or weightlifting, at least 2 times a week.  Take medicines as told by your health care provider.  Do not use any products that contain nicotine or tobacco, such as cigarettes and e-cigarettes. If you need help quitting, ask your health care provider.  Work with a Veterinary surgeon or diabetes educator to identify strategies to manage stress and any emotional and social challenges. Questions to ask a health care provider  Do I need to meet with a diabetes educator?  Do I need to meet with a dietitian?  What number can I call if I have questions?  When are the best times to check my blood glucose? Where to find more information:  American Diabetes Association: diabetes.org  Academy of Nutrition and Dietetics: www.eatright.[employer] of Diabetes and Digestive and Kidney Diseases (NIH): CarFlippers.tn Summary  A healthy meal plan  will help you control your blood glucose and maintain a healthy lifestyle.  Working with a diet and nutrition specialist (dietitian) can help you make a meal plan that is best for you.  Keep in mind that carbohydrates (carbs) and alcohol have immediate effects on your blood glucose levels. It is important to count carbs and to use alcohol carefully. This information is not intended to replace advice given to you by your health care provider. Make sure you discuss any questions you have with your health care provider. Document Revised: 10/31/2017 Document Reviewed: 12/23/2016 Elsevier Patient Education  2020 [employer].

## 2020-05-03 NOTE — Assessment & Plan Note (Signed)
Suspect this is multi-factorial. Could be secondary to uncontrolled diabetes, poor diet, lack of exercise, OSA, etc.  Checking labs today including TSH, CBC, CMP.  Encouraged to work on diet, gain better control over diabetes, exercise.

## 2020-05-03 NOTE — Assessment & Plan Note (Signed)
Uncontrolled without improvement since last visit. A1C of 9.6 today. He is also not checking glucose readings.  Discussed the absolute need to check glucose levels. Given his A1C of 9.6, we will slightly increase his Basaglar to 15 units.  He will check glucose readings twice daily at minimum. He will work on stopping soda, increasing water, improving his diet.   Continue Jardiance, Glipizide, and Metformin.  He will update with glucose readings in a few weeks. We will see him back in 6 weeks with glucose logs.

## 2020-05-03 NOTE — Progress Notes (Signed)
Subjective:    Patient ID: Joseph [name], male    DOB: [birth year], 49 y.o.   MRN: [id number]  HPI  This visit occurred during the SARS-CoV-2 public health emergency.  Safety protocols were in place, including screening questions prior to the visit, additional usage of staff PPE, and extensive cleaning of exam room while observing appropriate contact time as indicated for disinfecting solutions.   [name] is a 49 year old male with a medical history of type 2 diabetes, chronic pain, GERD, hypogonadism, chronic joint pain, OSA, COPD, hyperlipidemia, insomnia, tobacco abuse who presents today for follow up of diabetes.  1) Type 2 Diabetes:  Current medications include: Glipizide 10 mg BID, Jardiance 10 mg, Metformin XR 1000 mg, Basaglar 10 units.   She is checking her blood glucose 0 times daily.   Last A1C: 9.4 in January 2021, 9.6 today Last Eye Exam: Scheduled for August/September 2021. Last Foot Exam: Due Pneumonia Vaccination: UTD ACE/ARB: None. Urine microalbumin due Statin: atorvastatin   BP Readings from Last 3 Encounters:  05/03/20 120/80  01/04/20 112/70  12/27/19 118/78   2) Chronic Fatigue: Chronic for years, history of OSA, uncontrolled diabetes, hypogonadism. He will wake up in the morning and feel so tired.   He is not checking his blood sugars as he's been too busy. He's drinking little water, mostly soda. He is not exercising. His last sleep study was 2 years ago.   Review of Systems  HENT: Positive for postnasal drip.   Eyes: Negative for visual disturbance.  Respiratory: Negative for shortness of breath.   Cardiovascular: Negative for chest pain.  Allergic/Immunologic: Positive for environmental allergies.  Neurological: Negative for dizziness, numbness and headaches.       Past Medical History:  Diagnosis Date  . Acute encephalopathy 03/24/2018  . Anxiety   . Arthritis    oa left knee and lower back  . Asthma   . Cataract    hx of bilateral  .  Chronic back pain   . Chronic pain of left knee   . COPD (chronic obstructive pulmonary disease) ([place])   . Diabetes mellitus    type 2  . Encephalopathy acute 03/24/2018  . GERD (gastroesophageal reflux disease)   . INGUINAL PAIN, LEFT 10/03/2009   Qualifier: Diagnosis of  By: Oneida Alar MD, Sandi L   . Oral thrush 11/10/2018  . Overdose    03-24-18 accidental  . Shortness of breath    with heavy exertion  . Sleep apnea      Social History   Socioeconomic History  . Marital status: Divorced    Spouse name: Not on file  . Number of children: Not on file  . Years of education: Not on file  . Highest education level: Not on file  Occupational History  . Occupation: disability  Tobacco Use  . Smoking status: Current Every Day Smoker    Packs/day: 1.00    Years: 26.00    Pack years: 26.00    Types: Cigarettes    Start date: 12/17/1993  . Smokeless tobacco: Never Used  . Tobacco comment: 1 ppd 01/04/2020  Substance and Sexual Activity  . Alcohol use: Never    Alcohol/week: 0.0 standard drinks  . Drug use: Never  . Sexual activity: Yes    Birth control/protection: Surgical  Other Topics Concern  . Not on file  Social History Narrative   Recent visit to [facility] in [place] d/t increased pain where he was prescribed percocet 7.5-325 mg for  pain.   Social Determinants of Health   Financial Resource Strain:   . Difficulty of Paying Living Expenses:   Food Insecurity:   . Worried About Charity fundraiser in the Last Year:   . Arboriculturist in the Last Year:   Transportation Needs:   . Film/video editor (Medical):   Marland Kitchen Lack of Transportation (Non-Medical):   Physical Activity:   . Days of Exercise per Week:   . Minutes of Exercise per Session:   Stress:   . Feeling of Stress :   Social Connections:   . Frequency of Communication with Friends and Family:   . Frequency of Social Gatherings with Friends and Family:   . Attends Religious Services:   . Active Member of  Clubs or Organizations:   . Attends Archivist Meetings:   Marland Kitchen Marital Status:   Intimate Partner Violence:   . Fear of Current or Ex-Partner:   . Emotionally Abused:   Marland Kitchen Physically Abused:   . Sexually Abused:     Past Surgical History:  Procedure Laterality Date  . CARPAL TUNNEL RELEASE Left   . CATARACT EXTRACTION W/PHACO Right 03/11/2016   Procedure: CATARACT EXTRACTION PHACO AND INTRAOCULAR LENS PLACEMENT (IOC);  Surgeon: Tonny Branch, MD;  Location: [facility];  Service: Ophthalmology;  Laterality: Right;  CDE 4.24  . EYE SURGERY Right 2018   ioc with lens replacement  . HERNIA REPAIR Bilateral 0865   umbilical  . KNEE ARTHROSCOPY WITH MEDIAL MENISECTOMY Left 04/22/2014   Procedure: LEFT KNEE ARTHROSCOPY WITH MEDIAL MENISECTOMY;  Surgeon: Carole Civil, MD;  Location: [facility];  Service: Orthopedics;  Laterality: Left;  . KNEE ARTHROSCOPY WITH MEDIAL MENISECTOMY Right 12/06/2015   Procedure: RIGHT KNEE ARTHROSCOPY WITH MEDIAL MENISECTOMY;  Surgeon: Carole Civil, MD;  Location: [facility];  Service: Orthopedics;  Laterality: Right;  . KNEE ARTHROSCOPY WITH MEDIAL MENISECTOMY Left 03/20/2017   Procedure: KNEE ARTHROSCOPY WITH MEDIAL MENISECTOMY;  Surgeon: Carole Civil, MD;  Location: [facility];  Service: Orthopedics;  Laterality: Left;  . ORIF WRIST FRACTURE Left 12/19/2016   Procedure: OPEN REDUCTION INTERNAL FIXATION (ORIF) WRIST FRACTURE;  Surgeon: Leanora Cover, MD;  Location: [place];  Service: Orthopedics;  Laterality: Left;  accumed   . PARTIAL KNEE ARTHROPLASTY Left 07/21/2018   Procedure: LEFT UNICOMPARTMENTAL KNEE;  Surgeon: Renette Butters, MD;  Location: [facility];  Service: Orthopedics;  Laterality: Left;  . SHOULDER ARTHROCENTESIS Right 2008 or 2010  . VASECTOMY  2014    Family History  Problem Relation Age of Onset  . Emphysema Maternal Grandfather   . Emphysema Maternal Grandmother   . Clotting disorder Maternal Grandmother     Allergies   Allergen Reactions  . Azithromycin Hives  . Clarithromycin   . Erythromycin Hives  . Keflex [Cephalexin] Nausea Only  . Metformin Nausea And Vomiting  . Symbicort [Budesonide-Formoterol Fumarate] Other (See Comments)    States that 3 doses were used and breathing became worse    Current Outpatient Medications on File Prior to Visit  Medication Sig Dispense Refill  . albuterol (PROVENTIL) (2.5 MG/3ML) 0.083% nebulizer solution INHALE 3 MLS VIA NEBULIZER FOUR TIMES DAILY AS NEEDED FOR SHORTNESS OF BREATH 360 mL 5  . ANDROGEL PUMP 20.25 MG/ACT (1.62%) GEL Apply 2 Pump topically daily.     Marland Kitchen atorvastatin (LIPITOR) 40 MG tablet Take 1 tablet (40 mg total) by mouth daily. For cholesterol. 90 tablet 3  . blood glucose meter  kit and supplies KIT Dispense based on patient and insurance preference. Use up to four times daily as directed. (FOR ICD-9 250.00, 250.01). 1 each 0  . buPROPion (WELLBUTRIN SR) 150 MG 12 hr tablet TAKE ONE TABLET BY MOUTH TWICE A DAY 180 tablet 0  . citalopram (CELEXA) 40 MG tablet TAKE ONE (1) TABLET BY MOUTH EVERY DAY 30 tablet 9  . cyclobenzaprine (FLEXERIL) 10 MG tablet Take 10 mg by mouth 2 (two) times daily as needed.    . fluticasone (FLONASE) 50 MCG/ACT nasal spray Place 2 sprays into both nostrils daily. 16 g 0  . gabapentin (NEURONTIN) 300 MG capsule TAKE 2 CAPSULES BY MOUTH TWICE A DAY 360 capsule 2  . glipiZIDE (GLUCOTROL) 10 MG tablet TAKE ONE TABLET TWICE DAILY FOR DIABETES 180 tablet 3  . Insulin Pen Needle (PEN NEEDLES) 31G X 6 MM MISC Use nightly with insulin. 100 each 2  . Ipratropium-Albuterol (COMBIVENT) 20-100 MCG/ACT AERS respimat Inhale 1 puff into the lungs every 4 (four) hours as needed for wheezing. 4 g 11  . JARDIANCE 10 MG TABS tablet TAKE ONE TABLET (10MG) BY MOUTH EVERY MORNING FOR DIABETES 90 tablet 1  . Lancets (ONETOUCH DELICA PLUS [id number]) MISC USE TO CHECK BLOOD SUGAR UP TO FOUR TIMES DAILY 100 each 5  . metFORMIN (GLUCOPHAGE-XR) 500 MG  24 hr tablet TAKE TWO TABLETS BY MOUTH DAILY WITH BREAKFAST 180 tablet 1  . mometasone-formoterol (DULERA) 200-5 MCG/ACT AERO Take 2 puffs first thing in am and then another 2 puffs about 12 hours later. 13 g 11  . ONETOUCH VERIO test strip USE TO CHECK BLOOD SUGAR UP TO FOUR TIMES DAILY 100 strip 5  . pantoprazole (PROTONIX) 20 MG tablet TAKE 1 TABLET(20 MG) BY MOUTH TWICE DAILY BEFORE A MEAL FOR HEARTBURN 180 tablet 1  . tamsulosin (FLOMAX) 0.4 MG CAPS capsule TAKE ONE CAPSULE BY MOUTH DAILY 90 capsule 1  . tapentadol HCl (NUCYNTA) 75 MG tablet Take 75 mg by mouth 2 (two) times daily.    . Turmeric 500 MG CAPS Take 1 capsule by mouth daily.    . varenicline (CHANTIX CONTINUING MONTH PAK) 1 MG tablet Take 1 tablet (1 mg total) by mouth 2 (two) times daily. 60 tablet 0  . varenicline (CHANTIX STARTING MONTH PAK) 0.5 MG X 11 & 1 MG X 42 tablet Take 0.5 mg tablet by mouth daily for 3 days, then increase to 0.5 mg twice daily for 4 days, then increase to 1 mg twice daily. 53 tablet 0  . zolpidem (AMBIEN) 10 MG tablet TAKE ONE TABLET (10MG TOTAL) BY MOUTH ATBEDTIME AS NEEDED FOR SLEEP 90 tablet 0   No current facility-administered medications on file prior to visit.    BP 120/80   Pulse (!) 106   Ht 5' 10" (1.778 m)   Wt 250 lb (113.4 kg)   SpO2 97%   BMI 35.87 kg/m    Objective:   Physical Exam  Constitutional: He appears well-nourished.  Cardiovascular: Normal rate and regular rhythm.  Respiratory: Effort normal and breath sounds normal.  Musculoskeletal:     Cervical back: Neck supple.  Skin: Skin is warm and dry.  Psychiatric: He has a normal mood and affect.           Assessment & Plan:

## 2020-05-04 ENCOUNTER — Encounter: Payer: Self-pay | Admitting: Radiology

## 2020-05-04 ENCOUNTER — Other Ambulatory Visit (INDEPENDENT_AMBULATORY_CARE_PROVIDER_SITE_OTHER): Payer: Medicare HMO

## 2020-05-04 DIAGNOSIS — D72829 Elevated white blood cell count, unspecified: Secondary | ICD-10-CM

## 2020-05-04 LAB — WHITE CELL DIFFERENTIAL
Basophils Relative: 0.9 % (ref 0.0–3.0)
Eosinophils Relative: 1.6 % (ref 0.0–5.0)
Lymphocytes Relative: 22 % (ref 12.0–46.0)
Monocytes Relative: 8.2 % (ref 3.0–12.0)
Neutrophils Relative %: 67.3 % (ref 43.0–77.0)

## 2020-05-04 LAB — COMPREHENSIVE METABOLIC PANEL
ALT: 18 U/L (ref 0–53)
AST: 13 U/L (ref 0–37)
Albumin: 4.6 g/dL (ref 3.5–5.2)
Alkaline Phosphatase: 98 U/L (ref 39–117)
BUN: 6 mg/dL (ref 6–23)
CO2: 27 mEq/L (ref 19–32)
Calcium: 9.4 mg/dL (ref 8.4–10.5)
Chloride: 98 mEq/L (ref 96–112)
Creatinine, Ser: 0.94 mg/dL (ref 0.40–1.50)
GFR: 84.95 mL/min (ref >60.00)
Glucose, Bld: 230 mg/dL — ABNORMAL HIGH (ref 70–99)
Potassium: 3.3 mEq/L — ABNORMAL LOW (ref 3.5–5.1)
Sodium: 136 mEq/L (ref 135–145)
Total Bilirubin: 0.6 mg/dL (ref 0.2–1.2)
Total Protein: 6.9 g/dL (ref 6.0–8.3)

## 2020-05-04 LAB — CBC
HCT: 44.8 % (ref 39.0–52.0)
Hemoglobin: 15.4 g/dL (ref 13.0–17.0)
MCHC: 34.3 g/dL (ref 30.0–36.0)
MCV: 89.9 fl (ref 78.0–100.0)
Platelets: 246 10*3/uL (ref 150.0–400.0)
RBC: 4.98 Mil/uL (ref 4.22–5.81)
RDW: 14.3 % (ref 11.5–15.5)
WBC: 15.4 10*3/uL — ABNORMAL HIGH (ref 4.0–10.5)

## 2020-05-04 LAB — TSH: TSH: 2.25 u[IU]/mL (ref 0.35–4.50)

## 2020-05-04 LAB — LIPID PANEL
Cholesterol: 149 mg/dL (ref 0–200)
HDL: 32.5 mg/dL — ABNORMAL LOW (ref >39.00)
NonHDL: 116.62
Total CHOL/HDL Ratio: 5
Triglycerides: 353 mg/dL — ABNORMAL HIGH (ref 0.0–149.0)
VLDL: 70.6 mg/dL — ABNORMAL HIGH (ref 0.0–40.0)

## 2020-05-04 LAB — LDL CHOLESTEROL, DIRECT: Direct LDL: 78 mg/dL

## 2020-05-15 ENCOUNTER — Other Ambulatory Visit: Payer: Self-pay | Admitting: Primary Care

## 2020-05-17 ENCOUNTER — Telehealth: Payer: Self-pay | Admitting: Internal Medicine

## 2020-05-17 DIAGNOSIS — J45909 Unspecified asthma, uncomplicated: Secondary | ICD-10-CM

## 2020-05-17 MED ORDER — BREO ELLIPTA 200-25 MCG/INH IN AEPB: 1.0000 | INHALATION_SPRAY | Freq: Every day | RESPIRATORY_TRACT | 5 refills | Status: DC

## 2020-05-17 NOTE — Telephone Encounter (Signed)
Fine with me to change back to Breo 200 one click each am though be aware it may cause more cough so rinse and gargle after use

## 2020-05-17 NOTE — Telephone Encounter (Signed)
Called and spoke with pt letting him know that MW said it is fine for him to go back on Breo and pt verbalized understanding. Verified preferred pharmacy and sent Rx in for pt. Nothing further needed.

## 2020-05-17 NOTE — Telephone Encounter (Signed)
Called and spoke with Patient.  Patient stated since he was switched to [facility] he has been using his emergency inhaler more often. Patient stated he feels Virgel Bouquet worked better for him. Patient is requesting to go back on Breo 200. Patient request a prescription be sent in to [facility]. Patient is scheduled OV 07/03/20 with Dr Sherene Sires.  Message routed to Dr Sherene Sires to advise  LOV- 01/04/20      Stop Brep   Start dulera 200 Take 2 puffs first thing in am and then another 2 puffs about 12 hours later.   Plan A = Automatic = Always=   Dulera 200 Take 2 puffs first thing in am and then another 2 puffs about 12 hours later.   Plan B = Backup (to supplement plan A, not to replace it) Only use your combivent as a rescue medication to be used if you can't catch your breath by resting or doing a relaxed purse lip breathing pattern.  - The less you use it, the better it will work when you need it. - Ok to use the inhaler up to 1 puffs  every 4 hours if you must but call for appointment if use goes up over your usual need - Don't leave home without it !!  (think of it like the spare tire for your car)   Plan C = Crisis (instead of Plan B but only if Plan B stops working) - only use your albuterol nebulizer if you first try Plan B and it fails to help > ok to use the nebulizer up to every 4 hours but if start needing it regularly call for immediate appointment  Depomedrol 120 mg IM today   Please schedule a follow up office visit in 6 months , call sooner if needed

## 2020-05-25 ENCOUNTER — Other Ambulatory Visit: Payer: Self-pay | Admitting: Primary Care

## 2020-05-25 DIAGNOSIS — G894 Chronic pain syndrome: Secondary | ICD-10-CM

## 2020-05-25 DIAGNOSIS — M25562 Pain in left knee: Secondary | ICD-10-CM

## 2020-05-29 NOTE — Progress Notes (Signed)
[id number] - PA Case ID: [id number] - Rx #: [id number] Need help? Call us at [phone]  Status  Sent to [employer]  DrugBasaglar KwikPen 100UNIT/ML pen-injectors prior authorization submitted via covermymeds.  Waiting response.

## 2020-06-02 ENCOUNTER — Other Ambulatory Visit: Payer: Self-pay | Admitting: Primary Care

## 2020-06-02 DIAGNOSIS — E119 Type 2 diabetes mellitus without complications: Secondary | ICD-10-CM

## 2020-06-02 MED ORDER — INSULIN DEGLUDEC 100 UNIT/ML ~~LOC~~ SOPN: 15.0000 [IU] | PEN_INJECTOR | Freq: Every day | SUBCUTANEOUS | 2 refills | Status: DC

## 2020-06-14 ENCOUNTER — Ambulatory Visit: Payer: Medicare HMO | Admitting: Primary Care

## 2020-06-14 ENCOUNTER — Other Ambulatory Visit: Payer: Self-pay | Admitting: Primary Care

## 2020-06-14 DIAGNOSIS — E119 Type 2 diabetes mellitus without complications: Secondary | ICD-10-CM

## 2020-06-14 MED ORDER — PEN NEEDLES 31G X 6 MM MISC: 5 refills | Status: DC

## 2020-06-15 ENCOUNTER — Other Ambulatory Visit: Payer: Self-pay | Admitting: Primary Care

## 2020-06-15 DIAGNOSIS — F419 Anxiety disorder, unspecified: Secondary | ICD-10-CM

## 2020-06-15 NOTE — Telephone Encounter (Signed)
Last prescribed on 08/10/2019 Last OV (follow up) with Mayra Reel on 05/03/2020 Future OV scheduled on 06/21/2020

## 2020-06-18 NOTE — Telephone Encounter (Signed)
He has a follow up visit scheduled for 06/21/20 and CPE scheduled for 07/05/20, we can do his follow up on the 4th of August if he likes, this will save him an extra trip. Let me know. Thanks!

## 2020-06-21 ENCOUNTER — Ambulatory Visit (INDEPENDENT_AMBULATORY_CARE_PROVIDER_SITE_OTHER): Payer: Medicare HMO | Admitting: Primary Care

## 2020-06-21 ENCOUNTER — Encounter: Payer: Self-pay | Admitting: Primary Care

## 2020-06-21 ENCOUNTER — Other Ambulatory Visit: Payer: Self-pay

## 2020-06-21 DIAGNOSIS — E119 Type 2 diabetes mellitus without complications: Secondary | ICD-10-CM | POA: Diagnosis not present

## 2020-06-21 MED ORDER — INSULIN DEGLUDEC 100 UNIT/ML ~~LOC~~ SOPN: 20.0000 [IU] | PEN_INJECTOR | Freq: Every day | SUBCUTANEOUS | 2 refills | Status: DC

## 2020-06-21 NOTE — Patient Instructions (Signed)
We've increased the dose of your insulin to 20 units.   Continue to check your blood sugars, notify me if you continue to consistently run at or above 150 after 2-3 weeks.  It is important that you improve your diet. Please limit carbohydrates in the form of white bread, rice, pasta, sweets, fast food, fried food, sugary drinks, etc. Increase your consumption of fresh fruits and vegetables, whole grains, lean protein.  Ensure you are consuming 64 ounces of water daily.  Schedule a follow up visit for 2 months for diabetes check.  It was a pleasure to see you today!

## 2020-06-21 NOTE — Progress Notes (Signed)
Subjective:    Patient ID: Joseph [name], male    DOB: [birth year], 49 y.o.   MRN: [id number]  HPI  This visit occurred during the SARS-CoV-2 public health emergency.  Safety protocols were in place, including screening questions prior to the visit, additional usage of staff PPE, and extensive cleaning of exam room while observing appropriate contact time as indicated for disinfecting solutions.   Mr. Belay is a 49 year old male with a history of type 2 diabetes, OSA, COPD, hypogonadism, chronic pain syndrome, tobacco abuse, chronic fatigue who presents today for follow up of diabetes.   Current medications include: Metformin XR 1000 mg daily, Jardiance 10 mg daily, Glipizide 10 mg BID.  Tresiba 15 units in AM, some days 18 to 19 units.   He is checking his blood glucose several times daily and is getting readings raging mid 100's to low 300's  Last A1C: 9.6 in early June 2021 Last Eye Exam: Due Last Foot Exam: Due  Pneumonia Vaccination: Completed in 2019 ACE/ARB: None, urine microalbumin negative in 2021 Statin: atorvastatin   BP Readings from Last 3 Encounters:  06/21/20 124/82  05/03/20 120/80  01/04/20 112/70      Review of Systems  Eyes: Negative for visual disturbance.  Respiratory: Negative for shortness of breath.   Cardiovascular: Negative for chest pain.  Neurological: Negative for dizziness and headaches.       Past Medical History:  Diagnosis Date   Acute encephalopathy 03/24/2018   Anxiety    Arthritis    oa left knee and lower back   Asthma    Cataract    hx of bilateral   Chronic back pain    Chronic pain of left knee    COPD (chronic obstructive pulmonary disease) ([facility])    Diabetes mellitus    type 2   Encephalopathy acute 03/24/2018   GERD (gastroesophageal reflux disease)    INGUINAL PAIN, LEFT 10/03/2009   Qualifier: Diagnosis of  By: Oneida Alar MD, Sandi L    Oral thrush 11/10/2018   Overdose    03-24-18 accidental   Shortness of  breath    with heavy exertion   Sleep apnea      Social History   Socioeconomic History   Marital status: Divorced    Spouse name: Not on file   Number of children: Not on file   Years of education: Not on file   Highest education level: Not on file  Occupational History   Occupation: disability  Tobacco Use   Smoking status: Current Every Day Smoker    Packs/day: 1.00    Years: 26.00    Pack years: 26.00    Types: Cigarettes    Start date: 12/17/1993   Smokeless tobacco: Never Used   Tobacco comment: 1 ppd 01/04/2020  Vaping Use   Vaping Use: Some days  Substance and Sexual Activity   Alcohol use: Never    Alcohol/week: 0.0 standard drinks   Drug use: Never   Sexual activity: Yes    Birth control/protection: Surgical  Other Topics Concern   Not on file  Social History Narrative   Recent visit to [facility] in [place] d/t increased pain where he was prescribed percocet 7.5-325 mg for pain.   Social Determinants of Health   Financial Resource Strain:    Difficulty of Paying Living Expenses:   Food Insecurity:    Worried About Charity fundraiser in the Last Year:    Arboriculturist in the  Last Year:   Transportation Needs:    Film/video editor (Medical):    Lack of Transportation (Non-Medical):   Physical Activity:    Days of Exercise per Week:    Minutes of Exercise per Session:   Stress:    Feeling of Stress :   Social Connections:    Frequency of Communication with Friends and Family:    Frequency of Social Gatherings with Friends and Family:    Attends Religious Services:    Active Member of Clubs or Organizations:    Attends Music therapist:    Marital Status:   Intimate Partner Violence:    Fear of Current or Ex-Partner:    Emotionally Abused:    Physically Abused:    Sexually Abused:     Past Surgical History:  Procedure Laterality Date   CARPAL TUNNEL RELEASE Left    CATARACT EXTRACTION W/PHACO  Right 03/11/2016   Procedure: CATARACT EXTRACTION PHACO AND INTRAOCULAR LENS PLACEMENT ([place]);  Surgeon: Tonny Branch, MD;  Location: [facility];  Service: Ophthalmology;  Laterality: Right;  CDE 4.24   EYE SURGERY Right 2018   ioc with lens replacement   HERNIA REPAIR Bilateral 0960   umbilical   KNEE ARTHROSCOPY WITH MEDIAL MENISECTOMY Left 04/22/2014   Procedure: LEFT KNEE ARTHROSCOPY WITH MEDIAL MENISECTOMY;  Surgeon: Carole Civil, MD;  Location: [facility];  Service: Orthopedics;  Laterality: Left;   KNEE ARTHROSCOPY WITH MEDIAL MENISECTOMY Right 12/06/2015   Procedure: RIGHT KNEE ARTHROSCOPY WITH MEDIAL MENISECTOMY;  Surgeon: Carole Civil, MD;  Location: [facility];  Service: Orthopedics;  Laterality: Right;   KNEE ARTHROSCOPY WITH MEDIAL MENISECTOMY Left 03/20/2017   Procedure: KNEE ARTHROSCOPY WITH MEDIAL MENISECTOMY;  Surgeon: Carole Civil, MD;  Location: [facility];  Service: Orthopedics;  Laterality: Left;   ORIF WRIST FRACTURE Left 12/19/2016   Procedure: OPEN REDUCTION INTERNAL FIXATION (ORIF) WRIST FRACTURE;  Surgeon: Leanora Cover, MD;  Location: [place];  Service: Orthopedics;  Laterality: Left;  accumed    PARTIAL KNEE ARTHROPLASTY Left 07/21/2018   Procedure: LEFT UNICOMPARTMENTAL KNEE;  Surgeon: Renette Butters, MD;  Location: [facility];  Service: Orthopedics;  Laterality: Left;   SHOULDER ARTHROCENTESIS Right 2008 or 2010   VASECTOMY  2014    Family History  Problem Relation Age of Onset   Emphysema Maternal Grandfather    Emphysema Maternal Grandmother    Clotting disorder Maternal Grandmother     Allergies  Allergen Reactions   Azithromycin Hives   Clarithromycin    Erythromycin Hives   Keflex [Cephalexin] Nausea Only   Metformin Nausea And Vomiting   Symbicort [Budesonide-Formoterol Fumarate] Other (See Comments)    States that 3 doses were used and breathing became worse    Current Outpatient Medications on File Prior to Visit   Medication Sig Dispense Refill   albuterol (PROVENTIL) (2.5 MG/3ML) 0.083% nebulizer solution INHALE 3 MLS VIA NEBULIZER FOUR TIMES DAILY AS NEEDED FOR SHORTNESS OF BREATH 360 mL 5   ANDROGEL PUMP 20.25 MG/ACT (1.62%) GEL Apply 2 Pump topically daily.      atorvastatin (LIPITOR) 40 MG tablet Take 1 tablet (40 mg total) by mouth daily. For cholesterol. 90 tablet 3   blood glucose meter kit and supplies KIT Dispense based on patient and insurance preference. Use up to four times daily as directed. (FOR ICD-9 250.00, 250.01). 1 each 0   buPROPion (WELLBUTRIN SR) 150 MG 12 hr tablet TAKE ONE TABLET BY MOUTH TWICE A DAY 180 tablet  0   cetirizine (ZYRTEC) 10 MG tablet Take 1 tablet (10 mg total) by mouth at bedtime. For allergies. 90 tablet 1   citalopram (CELEXA) 40 MG tablet TAKE ONE (1) TABLET BY MOUTH EVERY DAY 90 tablet 0   cyclobenzaprine (FLEXERIL) 10 MG tablet Take 10 mg by mouth 2 (two) times daily as needed.     fluticasone (FLONASE) 50 MCG/ACT nasal spray Place 2 sprays into both nostrils daily. 16 g 0   fluticasone furoate-vilanterol (BREO ELLIPTA) 200-25 MCG/INH AEPB Inhale 1 puff into the lungs daily. 60 each 5   gabapentin (NEURONTIN) 300 MG capsule TAKE 2 CAPSULES BY MOUTH TWICE A DAY. 360 capsule 1   glipiZIDE (GLUCOTROL) 10 MG tablet TAKE ONE TABLET TWICE DAILY FOR DIABETES 180 tablet 3   insulin degludec (TRESIBA) 100 UNIT/ML FlexTouch Pen Inject 0.15 mLs (15 Units total) into the skin at bedtime. 15 mL 2   Insulin Pen Needle (PEN NEEDLES) 31G X 6 MM MISC Use nightly with insulin. 100 each 5   Ipratropium-Albuterol (COMBIVENT) 20-100 MCG/ACT AERS respimat Inhale 1 puff into the lungs every 4 (four) hours as needed for wheezing. 4 g 11   JARDIANCE 10 MG TABS tablet TAKE ONE TABLET (10MG) BY MOUTH EVERY MORNING FOR DIABETES 90 tablet 1   Lancets (ONETOUCH DELICA PLUS [id number]) MISC USE TO CHECK BLOOD SUGAR UP TO FOUR TIMES DAILY 100 each 5   metFORMIN (GLUCOPHAGE-XR)  500 MG 24 hr tablet TAKE TWO TABLETS BY MOUTH DAILY WITH BREAKFAST 180 tablet 1   ONETOUCH VERIO test strip USE TO CHECK BLOOD SUGAR UP TO FOUR TIMES DAILY 100 strip 5   pantoprazole (PROTONIX) 20 MG tablet TAKE 1 TABLET(20 MG) BY MOUTH TWICE DAILY BEFORE A MEAL FOR HEARTBURN 180 tablet 1   tamsulosin (FLOMAX) 0.4 MG CAPS capsule TAKE ONE CAPSULE BY MOUTH DAILY 90 capsule 1   tapentadol HCl (NUCYNTA) 75 MG tablet Take 75 mg by mouth 2 (two) times daily.     Turmeric 500 MG CAPS Take 1 capsule by mouth daily.     varenicline (CHANTIX CONTINUING MONTH PAK) 1 MG tablet Take 1 tablet (1 mg total) by mouth 2 (two) times daily. 60 tablet 0   varenicline (CHANTIX STARTING MONTH PAK) 0.5 MG X 11 & 1 MG X 42 tablet Take 0.5 mg tablet by mouth daily for 3 days, then increase to 0.5 mg twice daily for 4 days, then increase to 1 mg twice daily. 53 tablet 0   zolpidem (AMBIEN) 10 MG tablet TAKE ONE TABLET (10MG TOTAL) BY MOUTH ATBEDTIME AS NEEDED FOR SLEEP 90 tablet 0   No current facility-administered medications on file prior to visit.    BP 124/82    Pulse 82    Temp (!) 96.2 F (35.7 C) (Temporal)    Ht _0  (1.778 m)    Wt 250 lb (113.4 kg)    SpO2 98%    BMI 35.87 kg/m    Objective:   Physical Exam Cardiovascular:     Rate and Rhythm: Normal rate and regular rhythm.  Pulmonary:     Effort: Pulmonary effort is normal.     Breath sounds: Normal breath sounds.  Musculoskeletal:     Cervical back: Neck supple.  Skin:    General: Skin is warm and dry.            Assessment & Plan:

## 2020-06-21 NOTE — Assessment & Plan Note (Signed)
Uncontrolled but seems to be getting better. Increase Tresiba to 20 units now, may increase further to 25 units after 2-3 weeks if glucose readings consistently remain above 150.  Foot exam today. He will schedule eye exam. Managed on statin. Urine micro negative in 2021  Follow up in 2 months for A1C check.

## 2020-06-24 ENCOUNTER — Other Ambulatory Visit: Payer: Self-pay | Admitting: Primary Care

## 2020-06-24 DIAGNOSIS — E785 Hyperlipidemia, unspecified: Secondary | ICD-10-CM

## 2020-06-29 ENCOUNTER — Other Ambulatory Visit: Payer: Self-pay | Admitting: Primary Care

## 2020-06-29 DIAGNOSIS — K219 Gastro-esophageal reflux disease without esophagitis: Secondary | ICD-10-CM

## 2020-07-03 ENCOUNTER — Ambulatory Visit: Payer: Medicare HMO | Admitting: Internal Medicine

## 2020-07-03 NOTE — Progress Notes (Deleted)
Subjective:   Patient ID: Joseph [name], male    DOB: 05/09/1971   MRN: [id number]    Brief patient profile:  49    yowm ***  without significant airflow obst by PFTs 06/02/12 and 03/08/2015 but Pos MCT 211/2016    History of Present Illness 03/08/2015 f/u ov/[name] re: still smoking / off dulera x 48 h and off saba hfa/ using neb 8 am of test /no change in symptoms or need for saba on or off dulera  Chief Complaint  Patient presents with  . Follow-up    PFT done today. Pt states that his breathing is about the same since last visit. He is using albuterol inhaler approx 3 x per day.   breathing does better if stays in house,  worse out in cold Even on dulera still using neb at least twice daily  Some sporadic dry fits of  cough with bending over even on dulera but not using ppi correctly  rec Prevacid 30 mg should be to Take 30- 60 min before your first and last meals of the day  I see no evidence that the dulera is helping you and would stop it at this point pending an asthma challenge test  Please see patient coordinator before you leave today  to schedule Methacholine test - do not take any albuterol with in 6 hours of the test> never done     10/03/2015  f/u ov/[name] re: still smoking /  ? asthma vs all vcd/ gerd  Chief Complaint  Patient presents with  . Follow-up    Pt states his breathing is overall doing well. He is using rescue inhaler and neb several times per day.   Wife left him in July 2016 and losing wt, dm better, breathing not since stopped dulera and never went for mct and can't afford it  No noct symptoms after hs saba/ cpap  saba use up when no access to dulera 200 but even on it he continues to perceive need for saba daily despite nl exams/ pfts rec Please see patient coordinator before you leave today  to schedule a methacholine challenge test>  POSITIVE Ok to resume dulera but stop it 24 hours before the methacholine and no albuterol w/in 6 hours  Please schedule a  follow up office visit in 4 weeks, sooner if needed    11/10/2018  f/u ov/[name] re: asthma/ still smoking  Chief Complaint  Patient presents with  . Follow-up    Breathing is overall doing well. He states when it is really cold out he uses his neb more- approx 5 x per wk. He does not use his combivent.   Dyspnea:  Fine as long as remembers to use breo 200 and it's not real cold outside, does fine in heat  Cough: none Sleeping: on cpap per Vassie Loll  s concerns SABA use: only on cold days then uses neb up to 3 x in a day if out it in, lasts "a few hours"  02: none   Thinking about hypnosis for cig smoking/ could not tol chantix rec Plan A = Automatic = BREO 200 one each am  Plan B = Backup Only use your  combivent respimat    Plan C = Crisis - only use your albuterol nebulizer if you first try Plan B and it fails to help > ok to use the nebulizer up to every 4 hours but if start needing it regularly call for immediate appointment   01/04/2020  f/u ov/[name] re: smoker / ab  Chief Complaint  Patient presents with  . Follow-up    Breathing has been worse lately- relates to colder weather. He is using combivent 2 x per wk and neb about 2 x per day.   Dyspnea:  Just when coughing to point of gagging  Cough: esp with cold weather exposure despite Breo 200 ? Better on dulera 200  Sleeping: on cpap fine  SABA use: way too much 02: no  rec Stop [employer] dulera 200 Take 2 puffs first thing in am and then another 2 puffs about 12 hours later.  Plan A = Automatic = Always=   Dulera 200 Take 2 puffs first thing in am and then another 2 puffs about 12 hours later.  Plan B = Backup (to supplement plan A, not to replace it) Only use your combivent as a rescue medication  Plan C = Crisis (instead of Plan B but only if Plan B stops working) - only use your albuterol nebulizer if you first try Plan B and it fails to help  Depomedrol 120 mg IM today  Please schedule a follow up office visit in 6 months  , call sooner if needed    07/03/2020  f/u ov/[name] re:  No chief complaint on file.    Dyspnea:  *** Cough: *** Sleeping: *** SABA use: *** 02: ***   No obvious day to day or daytime variability or assoc excess/ purulent sputum or mucus plugs or hemoptysis or cp or chest tightness, subjective wheeze or overt sinus or hb symptoms.   *** without nocturnal  or early am exacerbation  of respiratory  c/o's or need for noct saba. Also denies any obvious fluctuation of symptoms with weather or environmental changes or other aggravating or alleviating factors except as outlined above   No unusual exposure hx or h/o childhood pna/ asthma or knowledge of premature birth.  Current Allergies, Complete Past Medical History, Past Surgical History, Family History, and Social History were reviewed in [employer] record.  ROS  The following are not active complaints unless bolded Hoarseness, sore throat, dysphagia, dental problems, itching, sneezing,  nasal congestion or discharge of excess mucus or purulent secretions, ear ache,   fever, chills, sweats, unintended wt loss or wt gain, classically pleuritic or exertional cp,  orthopnea pnd or arm/hand swelling  or leg swelling, presyncope, palpitations, abdominal pain, anorexia, nausea, vomiting, diarrhea  or change in bowel habits or change in bladder habits, change in stools or change in urine, dysuria, hematuria,  rash, arthralgias, visual complaints, headache, numbness, weakness or ataxia or problems with walking or coordination,  change in mood or  memory.        No outpatient medications have been marked as taking for the 07/03/20 encounter (Appointment) with Nyoka Cowden, MD.                        Objective:   Physical Exam   07/03/2020   ***  01/04/2020  251  11/10/2018  245  12/31/2013  294  >    07/27/2014  286 > 11/29/2014   289> 03/08/2015  284 > 10/03/2015 266 > 10/31/2015 273 > 12/12/2015    280 > 05/23/2017  245        Vital signs reviewed  07/03/2020  - Note at rest 02 sats  ***% on ***  Assessment & Plan:

## 2020-07-04 ENCOUNTER — Other Ambulatory Visit: Payer: Self-pay | Admitting: Family Medicine

## 2020-07-05 ENCOUNTER — Ambulatory Visit (INDEPENDENT_AMBULATORY_CARE_PROVIDER_SITE_OTHER): Payer: Medicare HMO | Admitting: Primary Care

## 2020-07-05 ENCOUNTER — Other Ambulatory Visit: Payer: Self-pay

## 2020-07-05 ENCOUNTER — Encounter: Payer: Self-pay | Admitting: Primary Care

## 2020-07-05 VITALS — BP 122/80 | HR 87 | Temp 96.4°F | Ht 70.0 in | Wt 254.2 lb

## 2020-07-05 DIAGNOSIS — N3943 Post-void dribbling: Secondary | ICD-10-CM

## 2020-07-05 DIAGNOSIS — F329 Major depressive disorder, single episode, unspecified: Secondary | ICD-10-CM

## 2020-07-05 DIAGNOSIS — D72829 Elevated white blood cell count, unspecified: Secondary | ICD-10-CM

## 2020-07-05 DIAGNOSIS — Z0000 Encounter for general adult medical examination without abnormal findings: Secondary | ICD-10-CM | POA: Diagnosis not present

## 2020-07-05 DIAGNOSIS — G4733 Obstructive sleep apnea (adult) (pediatric): Secondary | ICD-10-CM

## 2020-07-05 DIAGNOSIS — Z23 Encounter for immunization: Secondary | ICD-10-CM | POA: Diagnosis not present

## 2020-07-05 DIAGNOSIS — Z1211 Encounter for screening for malignant neoplasm of colon: Secondary | ICD-10-CM | POA: Diagnosis not present

## 2020-07-05 DIAGNOSIS — G4700 Insomnia, unspecified: Secondary | ICD-10-CM

## 2020-07-05 DIAGNOSIS — G894 Chronic pain syndrome: Secondary | ICD-10-CM

## 2020-07-05 DIAGNOSIS — F419 Anxiety disorder, unspecified: Secondary | ICD-10-CM | POA: Diagnosis not present

## 2020-07-05 DIAGNOSIS — F17210 Nicotine dependence, cigarettes, uncomplicated: Secondary | ICD-10-CM

## 2020-07-05 DIAGNOSIS — F32A Depression, unspecified: Secondary | ICD-10-CM

## 2020-07-05 DIAGNOSIS — J45909 Unspecified asthma, uncomplicated: Secondary | ICD-10-CM

## 2020-07-05 DIAGNOSIS — K219 Gastro-esophageal reflux disease without esophagitis: Secondary | ICD-10-CM

## 2020-07-05 DIAGNOSIS — E291 Testicular hypofunction: Secondary | ICD-10-CM

## 2020-07-05 DIAGNOSIS — J449 Chronic obstructive pulmonary disease, unspecified: Secondary | ICD-10-CM

## 2020-07-05 DIAGNOSIS — K5909 Other constipation: Secondary | ICD-10-CM

## 2020-07-05 DIAGNOSIS — E349 Endocrine disorder, unspecified: Secondary | ICD-10-CM

## 2020-07-05 DIAGNOSIS — E785 Hyperlipidemia, unspecified: Secondary | ICD-10-CM

## 2020-07-05 MED ORDER — BUPROPION HCL ER (SR) 150 MG PO TB12: 150.0000 mg | ORAL_TABLET | Freq: Two times a day (BID) | ORAL | 3 refills | Status: DC

## 2020-07-05 NOTE — Assessment & Plan Note (Signed)
Compliant to [facility] daily, following with Dr. Sherene Sires. Using Combivent inhaler infrequently, using nebulizer once daily.   Continue same.

## 2020-07-05 NOTE — Assessment & Plan Note (Signed)
Following with pulmonology.  Compliant to current regimen. Strongly advised he quit smoking, Rx for Chantix provided last visit, he is still contemplating quitting.

## 2020-07-05 NOTE — Assessment & Plan Note (Signed)
Denies concerns today. °Continue to monitor. °

## 2020-07-05 NOTE — Assessment & Plan Note (Signed)
No longer on pantoprazole, has switched back to lansoprazole. Encouraged tobacco cessation, healthy diet.

## 2020-07-05 NOTE — Addendum Note (Signed)
Addended by: Tawnya Crook on: 07/05/2020 10:43 AM   Modules accepted: Orders

## 2020-07-05 NOTE — Assessment & Plan Note (Signed)
Doing well on Ambien for which he requires. Continue same.

## 2020-07-05 NOTE — Assessment & Plan Note (Signed)
Following with urology, continue testosterone gel.

## 2020-07-05 NOTE — Assessment & Plan Note (Signed)
Doing well on tamsulosin, following with Urology.

## 2020-07-05 NOTE — Assessment & Plan Note (Signed)
Compliant to CPAP nightly, continue same. Following with pulmonology.   

## 2020-07-05 NOTE — Assessment & Plan Note (Signed)
Following with pain management, continue current regimen.  

## 2020-07-05 NOTE — Assessment & Plan Note (Signed)
Doing well on citalopram and bupropion, continue same. Denies SI/HI.

## 2020-07-05 NOTE — Assessment & Plan Note (Signed)
Following with Urology, managed on testosterone Gel. Continue current regimen.

## 2020-07-05 NOTE — Assessment & Plan Note (Signed)
Rx for Chantix sent last visit, he has yet to fill. Smoking 1 and 1/2 PPD. Strongly advised he quite.

## 2020-07-05 NOTE — Assessment & Plan Note (Signed)
LDL under good control, continue atorvastatin. 

## 2020-07-05 NOTE — Patient Instructions (Addendum)
Start exercising. You should be getting 150 minutes of moderate intensity exercise weekly.  It's important to improve your diet by reducing consumption of fast food, fried food, processed snack foods, sugary drinks. Increase consumption of fresh vegetables and fruits, whole grains, water.  Ensure you are drinking 64 ounces of water daily.  Stop smoking!!  You will be contacted regarding your referral to GI for the colonoscopy.  Please let us know if you have not been contacted within two weeks.   Schedule a follow up visit for 2-3 months for diabetes check.  It was a pleasure to see you today!   Preventive Care 18-69 Years Old, Male Preventive care refers to lifestyle choices and visits with your health care provider that can promote health and wellness. This includes:  A yearly physical exam. This is also called an annual well check.  Regular dental and eye exams.  Immunizations.  Screening for certain conditions.  Healthy lifestyle choices, such as eating a healthy diet, getting regular exercise, not using drugs or products that contain nicotine and tobacco, and limiting alcohol use. What can I expect for my preventive care visit? Physical exam Your health care provider will check:  Height and weight. These may be used to calculate body mass index (BMI), which is a measurement that tells if you are at a healthy weight.  Heart rate and blood pressure.  Your skin for abnormal spots. Counseling Your health care provider may ask you questions about:  Alcohol, tobacco, and drug use.  Emotional well-being.  Home and relationship well-being.  Sexual activity.  Eating habits.  Work and work Statistician. What immunizations do I need?  Influenza (flu) vaccine  This is recommended every year. Tetanus, diphtheria, and pertussis (Tdap) vaccine  You may need a Td booster every 10 years. Varicella (chickenpox) vaccine  You may need this vaccine if you have not already  been vaccinated. Zoster (shingles) vaccine  You may need this after age 75. Measles, mumps, and rubella (MMR) vaccine  You may need at least one dose of MMR if you were born in 1957 or later. You may also need a second dose. Pneumococcal conjugate (PCV13) vaccine  You may need this if you have certain conditions and were not previously vaccinated. Pneumococcal polysaccharide (PPSV23) vaccine  You may need one or two doses if you smoke cigarettes or if you have certain conditions. Meningococcal conjugate (MenACWY) vaccine  You may need this if you have certain conditions. Hepatitis A vaccine  You may need this if you have certain conditions or if you travel or work in places where you may be exposed to hepatitis A. Hepatitis B vaccine  You may need this if you have certain conditions or if you travel or work in places where you may be exposed to hepatitis B. Haemophilus influenzae type b (Hib) vaccine  You may need this if you have certain risk factors. Human papillomavirus (HPV) vaccine  If recommended by your health care provider, you may need three doses over 6 months. You may receive vaccines as individual doses or as more than one vaccine together in one shot (combination vaccines). Talk with your health care provider about the risks and benefits of combination vaccines. What tests do I need? Blood tests  Lipid and cholesterol levels. These may be checked every 5 years, or more frequently if you are over 65 years old.  Hepatitis C test.  Hepatitis B test. Screening  Lung cancer screening. You may have this screening every year  starting at age 62 if you have a 30-pack-year history of smoking and currently smoke or have quit within the past 15 years.  Prostate cancer screening. Recommendations will vary depending on your family history and other risks.  Colorectal cancer screening. All adults should have this screening starting at age 68 and continuing until age 43. Your  health care provider may recommend screening at age 12 if you are at increased risk. You will have tests every 1-10 years, depending on your results and the type of screening test.  Diabetes screening. This is done by checking your blood sugar (glucose) after you have not eaten for a while (fasting). You may have this done every 1-3 years.  Sexually transmitted disease (STD) testing. Follow these instructions at home: Eating and drinking  Eat a diet that includes fresh fruits and vegetables, whole grains, lean protein, and low-fat dairy products.  Take vitamin and mineral supplements as recommended by your health care provider.  Do not drink alcohol if your health care provider tells you not to drink.  If you drink alcohol: ? Limit how much you have to 0-2 drinks a day. ? Be aware of how much alcohol is in your drink. In the [place] one drink equals one 12 oz bottle of beer (355 mL), one 5 oz glass of wine (148 mL), or one 1 oz glass of hard liquor (44 mL). Lifestyle  Take daily care of your teeth and gums.  Stay active. Exercise for at least 30 minutes on 5 or more days each week.  Do not use any products that contain nicotine or tobacco, such as cigarettes, e-cigarettes, and chewing tobacco. If you need help quitting, ask your health care provider.  If you are sexually active, practice safe sex. Use a condom or other form of protection to prevent STIs (sexually transmitted infections).  Talk with your health care provider about taking a low-dose aspirin every day starting at age 69. What's next?  Go to your health care provider once a year for a well check visit.  Ask your health care provider how often you should have your eyes and teeth checked.  Stay up to date on all vaccines. This information is not intended to replace advice given to you by your health care provider. Make sure you discuss any questions you have with your health care provider. Document Revised: 11/12/2018  Document Reviewed: 11/12/2018 Elsevier Patient Education  2020 [employer].

## 2020-07-05 NOTE — Assessment & Plan Note (Signed)
Shingrix due, first dose provided today.  Other immunizations UTD. PSA UTD, follows with Urology. Colonoscopy due, orders placed. Discussed the importance of a healthy diet and regular exercise in order for weight loss, and to reduce the risk of any potential medical problems.  Exam today stable. Labs reviewed from June 2021.

## 2020-07-05 NOTE — Assessment & Plan Note (Addendum)
Chronic, could be from tobacco abuse.  Continue to monitor. Differential negative last visit. Consider pathology smear.

## 2020-07-05 NOTE — Progress Notes (Signed)
Subjective:    Patient ID: Joseph [name], male    DOB: [birth year], 49 y.o.   MRN: [id number]  HPI  This visit occurred during the SARS-CoV-2 public health emergency.  Safety protocols were in place, including screening questions prior to the visit, additional usage of staff PPE, and extensive cleaning of exam room while observing appropriate contact time as indicated for disinfecting solutions.   Joseph [name] is a 49 year old male who presents today for complete physical and general follow up.  Immunizations: -Tetanus: Completed in 2018 -Influenza: Due this season  -Shingles: Never completed  -Pneumonia: Completed last in 2019 -Covid-19: Due  Diet: He is working on his diet.  Exercise: He is active with work.  Eye exam: Scheduled for September 2021 Dental exam: No recent exam  Colonoscopy:  Completed over 10 years ago.  Hep C Screen: Due  BP Readings from Last 3 Encounters:  07/05/20 122/80  06/21/20 124/82  05/03/20 120/80     Review of Systems  Constitutional: Negative for unexpected weight change.  HENT: Negative for rhinorrhea.   Respiratory: Negative for cough and shortness of breath.   Cardiovascular: Negative for chest pain.  Gastrointestinal: Negative for constipation and diarrhea.  Genitourinary: Negative for difficulty urinating.  Musculoskeletal: Positive for arthralgias.  Skin: Negative for rash.  Allergic/Immunologic: Negative for environmental allergies.  Neurological: Negative for dizziness, numbness and headaches.  Psychiatric/Behavioral: The patient is not nervous/anxious.        Past Medical History:  Diagnosis Date  . Acute encephalopathy 03/24/2018  . Anxiety   . Arthritis    oa left knee and lower back  . Asthma   . Cataract    hx of bilateral  . Chronic back pain   . Chronic pain of left knee   . COPD (chronic obstructive pulmonary disease) ([place])   . Diabetes mellitus    type 2  . Encephalopathy acute 03/24/2018  . GERD  (gastroesophageal reflux disease)   . INGUINAL PAIN, LEFT 10/03/2009   Qualifier: Diagnosis of  By: Oneida Alar MD, Sandi L   . Oral thrush 11/10/2018  . Overdose    03-24-18 accidental  . Shortness of breath    with heavy exertion  . Sleep apnea      Social History   Socioeconomic History  . Marital status: Divorced    Spouse name: Not on file  . Number of children: Not on file  . Years of education: Not on file  . Highest education level: Not on file  Occupational History  . Occupation: disability  Tobacco Use  . Smoking status: Current Every Day Smoker    Packs/day: 1.00    Years: 26.00    Pack years: 26.00    Types: Cigarettes    Start date: 12/17/1993  . Smokeless tobacco: Never Used  . Tobacco comment: 1 ppd 01/04/2020  Vaping Use  . Vaping Use: Some days  Substance and Sexual Activity  . Alcohol use: Never    Alcohol/week: 0.0 standard drinks  . Drug use: Never  . Sexual activity: Yes    Birth control/protection: Surgical  Other Topics Concern  . Not on file  Social History Narrative   Recent visit to [facility] in [place] d/t increased pain where he was prescribed percocet 7.5-325 mg for pain.   Social Determinants of Health   Financial Resource Strain:   . Difficulty of Paying Living Expenses:   Food Insecurity:   . Worried About Charity fundraiser in the Last  Year:   . Ran Out of Food in the Last Year:   Transportation Needs:   . Film/video editor (Medical):   Marland Kitchen Lack of Transportation (Non-Medical):   Physical Activity:   . Days of Exercise per Week:   . Minutes of Exercise per Session:   Stress:   . Feeling of Stress :   Social Connections:   . Frequency of Communication with Friends and Family:   . Frequency of Social Gatherings with Friends and Family:   . Attends Religious Services:   . Active Member of Clubs or Organizations:   . Attends Archivist Meetings:   Marland Kitchen Marital Status:   Intimate Partner Violence:   . Fear of Current or  Ex-Partner:   . Emotionally Abused:   Marland Kitchen Physically Abused:   . Sexually Abused:     Past Surgical History:  Procedure Laterality Date  . CARPAL TUNNEL RELEASE Left   . CATARACT EXTRACTION W/PHACO Right 03/11/2016   Procedure: CATARACT EXTRACTION PHACO AND INTRAOCULAR LENS PLACEMENT (IOC);  Surgeon: Tonny Branch, MD;  Location: [facility];  Service: Ophthalmology;  Laterality: Right;  CDE 4.24  . EYE SURGERY Right 2018   ioc with lens replacement  . HERNIA REPAIR Bilateral 0174   umbilical  . KNEE ARTHROSCOPY WITH MEDIAL MENISECTOMY Left 04/22/2014   Procedure: LEFT KNEE ARTHROSCOPY WITH MEDIAL MENISECTOMY;  Surgeon: Carole Civil, MD;  Location: [facility];  Service: Orthopedics;  Laterality: Left;  . KNEE ARTHROSCOPY WITH MEDIAL MENISECTOMY Right 12/06/2015   Procedure: RIGHT KNEE ARTHROSCOPY WITH MEDIAL MENISECTOMY;  Surgeon: Carole Civil, MD;  Location: [facility];  Service: Orthopedics;  Laterality: Right;  . KNEE ARTHROSCOPY WITH MEDIAL MENISECTOMY Left 03/20/2017   Procedure: KNEE ARTHROSCOPY WITH MEDIAL MENISECTOMY;  Surgeon: Carole Civil, MD;  Location: [facility];  Service: Orthopedics;  Laterality: Left;  . ORIF WRIST FRACTURE Left 12/19/2016   Procedure: OPEN REDUCTION INTERNAL FIXATION (ORIF) WRIST FRACTURE;  Surgeon: Leanora Cover, MD;  Location: [place];  Service: Orthopedics;  Laterality: Left;  accumed   . PARTIAL KNEE ARTHROPLASTY Left 07/21/2018   Procedure: LEFT UNICOMPARTMENTAL KNEE;  Surgeon: Renette Butters, MD;  Location: [facility];  Service: Orthopedics;  Laterality: Left;  . SHOULDER ARTHROCENTESIS Right 2008 or 2010  . VASECTOMY  2014    Family History  Problem Relation Age of Onset  . Emphysema Maternal Grandfather   . Emphysema Maternal Grandmother   . Clotting disorder Maternal Grandmother     Allergies  Allergen Reactions  . Azithromycin Hives  . Clarithromycin   . Erythromycin Hives  . Keflex [Cephalexin] Nausea Only  . Metformin Nausea  And Vomiting  . Symbicort [Budesonide-Formoterol Fumarate] Other (See Comments)    States that 3 doses were used and breathing became worse    Current Outpatient Medications on File Prior to Visit  Medication Sig Dispense Refill  . albuterol (PROVENTIL) (2.5 MG/3ML) 0.083% nebulizer solution INHALE 3 MLS VIA NEBULIZER FOUR TIMES DAILY AS NEEDED FOR SHORTNESS OF BREATH 360 mL 5  . ANDROGEL PUMP 20.25 MG/ACT (1.62%) GEL Apply 2 Pump topically daily.     Marland Kitchen atorvastatin (LIPITOR) 40 MG tablet TAKE ONE TABLET (40MG TOTAL) BY MOUTH DAILY FOR CHOLESTEROL 90 tablet 3  . blood glucose meter kit and supplies KIT Dispense based on patient and insurance preference. Use up to four times daily as directed. (FOR ICD-9 250.00, 250.01). 1 each 0  . buPROPion (WELLBUTRIN SR) 150 MG 12 hr tablet TAKE  ONE TABLET BY MOUTH TWICE A DAY 180 tablet 0  . cetirizine (ZYRTEC) 10 MG tablet Take 1 tablet (10 mg total) by mouth at bedtime. For allergies. 90 tablet 1  . citalopram (CELEXA) 40 MG tablet TAKE ONE (1) TABLET BY MOUTH EVERY DAY 90 tablet 0  . cyclobenzaprine (FLEXERIL) 10 MG tablet Take 10 mg by mouth 2 (two) times daily as needed.    . fluticasone (FLONASE) 50 MCG/ACT nasal spray Place 2 sprays into both nostrils daily. 16 g 0  . fluticasone furoate-vilanterol (BREO ELLIPTA) 200-25 MCG/INH AEPB Inhale 1 puff into the lungs daily. 60 each 5  . gabapentin (NEURONTIN) 300 MG capsule TAKE 2 CAPSULES BY MOUTH TWICE A DAY. 360 capsule 1  . glipiZIDE (GLUCOTROL) 10 MG tablet TAKE ONE TABLET TWICE DAILY FOR DIABETES 180 tablet 3  . insulin degludec (TRESIBA) 100 UNIT/ML FlexTouch Pen Inject 0.2 mLs (20 Units total) into the skin daily. 15 mL 2  . Insulin Pen Needle (PEN NEEDLES) 31G X 6 MM MISC Use nightly with insulin. 100 each 5  . Ipratropium-Albuterol (COMBIVENT) 20-100 MCG/ACT AERS respimat Inhale 1 puff into the lungs every 4 (four) hours as needed for wheezing. 4 g 11  . JARDIANCE 10 MG TABS tablet TAKE ONE  TABLET (10MG) BY MOUTH EVERY MORNING FOR DIABETES 90 tablet 1  . Lancets (ONETOUCH DELICA PLUS [id number]) MISC USE TO CHECK BLOOD SUGAR UP TO FOUR TIMES DAILY 100 each 5  . metFORMIN (GLUCOPHAGE-XR) 500 MG 24 hr tablet TAKE TWO TABLETS BY MOUTH DAILY WITH BREAKFAST 180 tablet 1  . ONETOUCH VERIO test strip USE TO CHECK BLOOD SUGAR UP TO FOUR TIMES DAILY 100 strip 5  . tamsulosin (FLOMAX) 0.4 MG CAPS capsule TAKE ONE CAPSULE BY MOUTH DAILY 90 capsule 1  . tapentadol HCl (NUCYNTA) 75 MG tablet Take 75 mg by mouth 2 (two) times daily.    . Turmeric 500 MG CAPS Take 1 capsule by mouth daily.    . varenicline (CHANTIX CONTINUING MONTH PAK) 1 MG tablet Take 1 tablet (1 mg total) by mouth 2 (two) times daily. 60 tablet 0  . varenicline (CHANTIX STARTING MONTH PAK) 0.5 MG X 11 & 1 MG X 42 tablet Take 0.5 mg tablet by mouth daily for 3 days, then increase to 0.5 mg twice daily for 4 days, then increase to 1 mg twice daily. 53 tablet 0  . zolpidem (AMBIEN) 10 MG tablet TAKE ONE TABLET (10MG TOTAL) BY MOUTH ATBEDTIME AS NEEDED FOR SLEEP 90 tablet 0   No current facility-administered medications on file prior to visit.    BP 122/80   Pulse 87   Temp (!) 96.4 F (35.8 C) (Temporal)   Ht '5\' 10"'  (1.778 m)   Wt 254 lb 4 oz (115.3 kg)   SpO2 98%   BMI 36.48 kg/m    Objective:   Physical Exam HENT:     Right Ear: Tympanic membrane and ear canal normal.     Left Ear: Tympanic membrane and ear canal normal.  Eyes:     Pupils: Pupils are equal, round, and reactive to light.  Cardiovascular:     Rate and Rhythm: Normal rate and regular rhythm.  Pulmonary:     Effort: Pulmonary effort is normal.     Breath sounds: Normal breath sounds.  Abdominal:     General: Bowel sounds are normal.     Palpations: Abdomen is soft.     Tenderness: There is no abdominal tenderness.  Musculoskeletal:  General: Normal range of motion.     Cervical back: Neck supple.  Skin:    General: Skin is warm and dry.   Neurological:     Mental Status: He is alert and oriented to person, place, and time.     Cranial Nerves: No cranial nerve deficit.     Deep Tendon Reflexes:     Reflex Scores:      Patellar reflexes are 2+ on the right side and 2+ on the left side. Psychiatric:        Mood and Affect: Mood normal.            Assessment & Plan:

## 2020-07-13 ENCOUNTER — Encounter: Payer: Self-pay | Admitting: Internal Medicine

## 2020-07-17 ENCOUNTER — Other Ambulatory Visit: Payer: Self-pay | Admitting: Primary Care

## 2020-07-17 DIAGNOSIS — M25562 Pain in left knee: Secondary | ICD-10-CM

## 2020-07-17 DIAGNOSIS — G8929 Other chronic pain: Secondary | ICD-10-CM

## 2020-07-17 DIAGNOSIS — G4700 Insomnia, unspecified: Secondary | ICD-10-CM

## 2020-07-19 ENCOUNTER — Telehealth: Payer: Self-pay | Admitting: Primary Care

## 2020-07-19 NOTE — Telephone Encounter (Signed)
Last prescribed on 04/17/2020 #90 with 0 refills Last OV (cpe ) with Joseph [name] on 07/05/2020 No future OV scheduled

## 2020-07-19 NOTE — Telephone Encounter (Signed)
Pt called checking on his rx Remus Loffler He is completely out pharmacy has been sending request for refill  [place] [facility]

## 2020-07-19 NOTE — Telephone Encounter (Signed)
Refills sent to pharmacy. 

## 2020-07-20 NOTE — Telephone Encounter (Signed)
Patient have already pick up Rx yesterday. Inform that Joseph [name] was out of the office. Patient understands and appreciate it.

## 2020-08-03 NOTE — Telephone Encounter (Signed)
I'm assuming a virtual visit, right? Okay to add on at 12:20 for 08/04/20

## 2020-08-03 NOTE — Telephone Encounter (Signed)
Pt called to get appointment tomorrow For sinus issues Bad headache drainage down throat no sore throat has not checked fever  He does think he has one.  Can pt be workin??

## 2020-08-18 ENCOUNTER — Ambulatory Visit (INDEPENDENT_AMBULATORY_CARE_PROVIDER_SITE_OTHER): Payer: Medicare HMO | Admitting: Internal Medicine

## 2020-08-18 ENCOUNTER — Encounter: Payer: Self-pay | Admitting: Internal Medicine

## 2020-08-18 ENCOUNTER — Other Ambulatory Visit: Payer: Self-pay

## 2020-08-18 DIAGNOSIS — F17210 Nicotine dependence, cigarettes, uncomplicated: Secondary | ICD-10-CM | POA: Diagnosis not present

## 2020-08-18 DIAGNOSIS — J45909 Unspecified asthma, uncomplicated: Secondary | ICD-10-CM | POA: Diagnosis not present

## 2020-08-18 NOTE — Assessment & Plan Note (Signed)
without significant airflow obst by PFTs 06/02/12   3 min discussion re active cigarette smoking in addition to office E&M  Ask about tobacco use:   ongoing Advise quitting   I took an extended  opportunity with this patient to outline the consequences of continued cigarette use  in airway disorders based on all the data we have from the multiple national lung health studies (perfomed over decades at millions of dollars in cost)  indicating that smoking cessation, not choice of inhalers or physicians, is the most important aspect of  His care.   Assess willingness:  Considering hypnosis now that chantix off marked Assist in quit attempt:  Per PCP when ready Arrange follow up:   Follow up per Primary Care planned    For smoking cessation classes call [phone]

## 2020-08-18 NOTE — Assessment & Plan Note (Signed)
Active smoker - PFT's s sign copd 06/2012 - med calendar 2/13 /15 > not using 11/29/14  - 11/29/2014 p extensive coaching HFA effectiveness =    90% from a baseline of 50%  -PFTs wnl 03/08/2015 4 h p alb neb> rec MCT at least 6 h p neb > done 10/12/2015  POS > resume dulera 100 2bid - 10/31/2015    increase dulera to 200 bid (samples provided)  - 05/23/2017  BREO 200 rx initiated > much better  - Spirometry 11/10/2018  FEV1 3.0 (76%)  Ratio 84 s curvature after am BREO 200  - 01/04/2020  After extensive coaching inhaler device,  effectiveness =    90% > change back to dulera 200 2bid due to severe cough on breo and overuse of saba  - 05/17/2020 req to go back to breo 200 > improved 08/18/2020   Despite smoking > All goals of chronic asthma control met including optimal function and elimination of symptoms with minimal need for rescue therapy.  Contingencies discussed in full including contacting this office immediately if not controlling the symptoms using the rule of two's.      I spent extra time with pt today reviewing appropriate use of albuterol for prn use on exertion with the following points: 1) saba is for relief of sob that does not improve by walking a slower pace or resting but rather if the pt does not improve after trying this first. 2) If the pt is convinced, as many are, that saba helps recover from activity faster then it's easy to tell if this is the case by re-challenging : ie stop, take the inhaler, then p 5 minutes try the exact same activity (intensity of workload) that just caused the symptoms and see if they are substantially diminished or not after saba 3) if there is an activity that reproducibly causes the symptoms, try the saba 15 min before the activity on alternate days   If in fact the saba really does help, then fine to continue to use it prn but advised may need to look closer at the maintenance regimen being used to achieve better control of airways disease with  exertion.    Pt informed of the seriousness of COVID 19 infection as a direct risk to lung health  and safey and to close contacts and should continue to wear a facemask in public and minimize exposure to public locations but especially avoid any area or activity where non-close contacts are not observing distancing or wearing an appropriate face mask.  I strongly recommended she take either of the vaccines available through local drugstores based on updated information on millions of Americans treated with the [employer] products  which have proven both safe and  effective even against the new delta variant.

## 2020-08-18 NOTE — Patient Instructions (Signed)
I very strongly recommend you get the moderna or pfizer vaccine as soon as possible based on your risk of dying from the virus  and the proven safety and benefit of these vaccines against even the delta variant.  This can save your life as well as  those of your loved ones,  especially if they are also not vaccinated.     Please schedule a follow up visit in 12 months but call sooner if needed

## 2020-08-18 NOTE — Progress Notes (Signed)
Subjective:   Patient ID: Joseph [name], male    DOB: [birth year]   MRN: [id number]    Brief patient profile:  40  yowm active smoker without significant airflow obst by PFTs 06/02/12 and 03/08/2015    History of Present Illness  In April 2013 cc  had pneumonia apparently. Went to [facility] ER and found some abnormality (nodule based on review of ER notes) on CXR that resulted in CT Angio 03/07/12 same visit. PE ruled out but found to have mediastinal nodes but the cxr nodule was not there. Apparently ER doctor said it was lymphoma and asked to see an oncologist. But primary care doctor felt that was over zealous and referred patient here. He prefers conservative mgmt to nodes if clinical suspicion for cancer is low  cc lack of energy (spent most of days yesterday in bed), esp in pollen, chronic dyspnea on exertion for 180 feet relieved by rest  But slowly progressive since 2002 for which he uses albuterol prn on average 4 times a day.  Walkt test 185 feet x 3 laps: no desaturation  He smokes at baseline; trying quit, prior intolerance (dreams) with chantix.   Only baseline mild chronic cough with associtaed wheeze that is occasional.. Does have some B symptoms like nocturnal diaphoresis (soaks pillows). Denies weight loss.   CT ANGIOGRAPHY CHEST  Comparison: 03/07/2012  An abnormal right lower paratracheal node has a short axis diameter  of 1.6 cm. An abnormal AP window lymph node has a short axis  diameter of 1.4 cm. Mild bilateral hilar and infrahilar adenopathy  noted. Scattered small axillary lymph nodes are present.  A subcarinal node has a short axis diameter of 1.5 cm.     OV 08/14/2012 Mediastinal nodes: PET scan 06/10/12 shows very low uptake in the mediastinal nodes. Suspicion for cancer is very low. Lymph nodes include 13 mm right lower paratracheal lymph node and 10 mm prevascular lymph node. ACE level 04/10/12  - is in 20s and normal  Still dyspneic: 1 flught of steps and better  with rest and with albuterol and atrovent prn. Dyspne also worse in winter and cooler weathers. Rates it as moderate. STable since last visit to slightly worse. thre is some associtaed cough. No asociated weight loss. COugh is moderate and worst early in morning and in cooler temperatues.. Denies sinus drainage. PFts show mixed picture - fvc 3.4L/69%, fev1 2.9/76%, Ratio 85 but 12% bd respose. TLC 5.7/83%. DLCO 23.7/69% rec rec #MEdiastinal node  - suspicion for cancer is low  - you will need CT chest in a year; we will schedule it later  #Smoking  - glad you are working on quitting  #Shortness of breath - this is due to asthma/copd and weight  - please start QVAR 2 puff twice daily - take sample and show technique and take prescription too  - focus on weight loss through the low glycemic diet; take diet sheet from [place]  #FOllowup  - 3 months with spirometry at followup  - flu shot and pneumovax through PMD as discussed   12/17/2013  acute ov/Jakaleb Payer still active smoker re: recurrent pattern of coughing x a decade intermittent / out of control since nov 2014/ can't take symbicort makes it worse / on percocet one tid at baseline  Chief Complaint  Patient presents with  . Acute Visit    MR pt.  C/o mostly nonprod cough with some thick white mucous, coughing until he almost passes out.  Went to  [facility] last week.    duoneb daily  Sometime skips the doses later in the day but always uses the am dose, sputum to purulent. Mostly sob with cough/ otherwise breaths ok Last neb was 4 h prior to OV   rec For Cough - use the flutter valve as much as you can plus mucinex dm 1200 mg every 12 hours as needed supplement with percocet up to 2 every 4hours For Breathing  Only use your duoneb  rescue medication . - Ok to use up to    every 4 hours if you must  Prednisone 10 mg take  4 each am x 2 days,   2 each am x 2 days,  1 each am x 2 days and stop  Pantoprazole (protonix) 40 mg   Take 30-60 min  before first meal of the day and Pepcid 20 mg one bedtime until return to office - this is the best way to tell whether stomach acid is contributing to your problem.  GERD  Diet . Please schedule a follow up office visit in 2 weeks, sooner if needed with all meds in hand    07/27/2014 f/u ov/Hodan Wurtz re: asthma / still smoking  Chief Complaint  Patient presents with  . Follow-up    Pt c/o increased SOB and cough for the past month- cough is occ prod with minimal clear sputum. Pt currently taking clindamycin for staph infection. Rarely using albuterol inhaler, but is using neb approx 4 times per day.   off dulera x 1.5 week denies more sob/ cough or need for rescue on vs off rec Plan A=  dulera 200 Take 2 puffs first thing in am and then another 2 puffs about 12 hours later (or formulary alternative- call us if you find a cheaper one)  Plan B =  Backup = proaire  Plan C = Backup plan B = nebulized albuterol up to 4 hours but call if needing this much at all The key is to stop smoking completely before smoking completely stops you!    11/29/2014 f/u ov/Nicholas Trompeter re: s/p "aecopd" still smoking  Chief Complaint  Patient presents with  . Follow-up    Pt states that his SOB and cough have been worse for the past several wks. He went to ED for COPD exacerbation on 11/14/14.  He is using proair 3-4 times per day and neb about 3 times per day on average.   one month prior to flare needing proair 1-2 per day and neb at least once or twice also - using neb first thing instead of dulera  Better p pred from er then worse again w/in a week of taper off assoc with congested cough but mostly white mucus esp in ams rec Think of your respiratory medications as multiple steps you can take to control your symptoms and avoid having to go to the ER Plan A is your maintenance daily no matter what meds: dulera Take 2 puffs first thing in am and then another 2 puffs about 12 hours later.  Plan B only use after you've used  your maintenance (Plan A) medication, and only if you can't catch your breath: Proair 2 every 4 hours as needed  Plan C only use after you've used plan A and B and still can't catch your breath: nebulizer albuterol 2.5 mg up to every 4 hours  Plan D(for Doctor):  If you've used A thru C and not doing a lot better or still needing C more  than a once a day,  D = call the doctor for evaluation asap Plan E (for ER):  If still not able to catch your breath, even after using your nebulizer up to every 4 hours, go to ER  For cough > delsym 2 tsp every 12 hours is the best you can buy Prevacid 30 mg Take 30-60 min before first meal of the day  GERD diet      03/08/2015 f/u ov/Margot Oriordan re: still smoking / off dulera x 48 h and off saba hfa/ using neb 8 am of test /no change in symptoms or need for saba on or off dulera  Chief Complaint  Patient presents with  . Follow-up    PFT done today. Pt states that his breathing is about the same since last visit. He is using albuterol inhaler approx 3 x per day.   breathing does better if stays in house,  worse out in cold Even on dulera still using neb at least twice daily  Some sporadic dry fits of  cough with bending over even on dulera but not using ppi correctly  rec Prevacid 30 mg should be to Take 30- 60 min before your first and last meals of the day  I see no evidence that the dulera is helping you and would stop it at this point pending an asthma challenge test  Please see patient coordinator before you leave today  to schedule Methacholine test - do not take any albuterol with in 6 hours of the test> never done     10/03/2015  f/u ov/Ranie Chinchilla re: still smoking /  ? asthma vs all vcd/ gerd  Chief Complaint  Patient presents with  . Follow-up    Pt states his breathing is overall doing well. He is using rescue inhaler and neb several times per day.   Wife left him in July 2016 and losing wt, dm better, breathing not since stopped dulera and never went for  mct and can't afford it  No noct symptoms after hs saba/ cpap  saba use up when no access to dulera 200 but even on it he continues to perceive need for saba daily despite nl exams/ pfts rec Please see patient coordinator before you leave today  to schedule a methacholine challenge test>  POSITIVE Ok to resume dulera but stop it 24 hours before the methacholine and no albuterol w/in 6 hours  Please schedule a follow up office visit in 4 weeks, sooner if needed     10/31/2015  f/u ov/Larkin Morelos re: mild persistent asthma  Chief Complaint  Patient presents with  . Follow-up    Breathing has been worse since decrease in [place].  He is using rescue inhaler 4-5 x per day and duoneb 2 x per day.   Noted a change on the dulera 100 but can't afford dulera in any form/ reliant on samples rec Increase the dulera 200 Take 2 puffs first thing in am and then another 2 puffs about 12 hours later.  Only use your albuterol (proair) as a rescue medication   Only use the nebulizer if you try the Proair first and it doesn't work    12/12/2015  f/u ov/Shabazz Mckey re: intrinsic asthma  Doing fine as long as stays out of cold and on dulera 200 despite still smoking   Chief Complaint  Patient presents with  . Follow-up    Breathing has improved some. He is using rescue inhaler "just when I go outside". He uses neb  5 x per wk on average.   rec No change in medications Keep working on getting rid of the cigarettes before they get rid of you   NP ov 05/07/17  We will give you samples of BREO today. Use this 1 puff once daily Use the Breo in place of [place].   05/23/2017  f/u ov/Jeovanni Heuring re: intrinsic asthma/ BREO 200 / prn neb  Chief Complaint  Patient presents with  . Follow-up    Breathing has improved on Breo. He has only had to use neb x 3 since last visit. He does not currently have a rescue inhaler.   Not limited by breathing from desired activities  But not very active rec Plan A = Automatic = BREO 200 one  each am  Work on inhaler technique:    Plan B = Backup Only use your  combivent respimat  as a rescue medication  Plan C = Crisis - only use your albuterol nebulizer if you first try Plan B     11/10/2018  f/u ov/Akari Crysler re: asthma/ still smoking  Chief Complaint  Patient presents with  . Follow-up    Breathing is overall doing well. He states when it is really cold out he uses his neb more- approx 5 x per wk. He does not use his combivent.   Dyspnea:  Fine as long as remembers to use breo 200 and it's not real cold outside, does fine in heat  Cough: none Sleeping: on cpap per Elsworth Soho  s concerns SABA use: only on cold days then uses neb up to 3 x in a day if out it in, lasts "a few hours"  02: none   Thinking about hypnosis for cig smoking/ could not tol chantix rec Plan A = Automatic = BREO 200 one each am  Plan B = Backup Only use your  combivent respimat    Plan C = Crisis - only use your albuterol nebulizer if you first try Plan B and it fails to help > ok to use the nebulizer up to every 4 hours but if start needing it regularly call for immediate appointment   01/04/2020  f/u ov/Chad Donoghue re: smoker / ab  Chief Complaint  Patient presents with  . Follow-up    Breathing has been worse lately- relates to colder weather. He is using combivent 2 x per wk and neb about 2 x per day.   Dyspnea:  Just when coughing to point of gagging  Cough: esp with cold weather exposure despite Breo 200 ? Better on dulera 200  Sleeping: on cpap fine  SABA use: way too much 02: no  rec Stop [employer] dulera 200 Take 2 puffs first thing in am and then another 2 puffs about 12 hours later.  Plan A = Automatic = Always=   Dulera 200 Take 2 puffs first thing in am and then another 2 puffs about 12 hours later.  Plan B = Backup (to supplement plan A, not to replace it) Only use your combivent as a rescue medication   Plan C = Crisis (instead of Plan B but only if Plan B stops working) - only use your  albuterol nebulizer if you first try Plan B and it fails to help > ok to use the nebulizer up to every 4 hours but if start needing it regularly call for immediate appointment Depomedrol 120 mg IM today    08/18/2020  f/u ov/Geddy Boydstun re:  AB/ still smoking/ not vaccinated  Chief  Complaint  Patient presents with  . Follow-up    Breathing is unchanged since the last visit. He is using his combivent 3-4 x per wk.    Dyspnea:  98 Cough: better, no more gagging since changed breo inhalation  Sleeping: problems with cpap machine/ alva  SABA use: as above 02: none   No obvious day to day or daytime variability or assoc excess/ purulent sputum or mucus plugs or hemoptysis or cp or chest tightness, subjective wheeze or overt sinus or hb symptoms.   Sleeping on cpap when it is working   without nocturnal  or early am exacerbation  of respiratory  c/o's or need for noct saba. Also denies any obvious fluctuation of symptoms with weather or environmental changes or other aggravating or alleviating factors except as outlined above   No unusual exposure hx or h/o childhood pna/ asthma or knowledge of premature birth.  Current Allergies, Complete Past Medical History, Past Surgical History, Family History, and Social History were reviewed in [employer] record.  ROS  The following are not active complaints unless bolded Hoarseness, sore throat, dysphagia, dental problems, itching, sneezing,  nasal congestion or discharge of excess mucus or purulent secretions, ear ache,   fever, chills, sweats, unintended wt loss or wt gain, classically pleuritic or exertional cp,  orthopnea pnd or arm/hand swelling  or leg swelling, presyncope, palpitations, abdominal pain, anorexia, nausea, vomiting, diarrhea  or change in bowel habits or change in bladder habits, change in stools or change in urine, dysuria, hematuria,  rash, arthralgias, visual complaints, headache, numbness, weakness or ataxia or  problems with walking or coordination,  change in mood or  memory.        Current Meds  Medication Sig  . albuterol (PROVENTIL) (2.5 MG/3ML) 0.083% nebulizer solution INHALE 3 MLS VIA NEBULIZER FOUR TIMES DAILY AS NEEDED FOR SHORTNESS OF BREATH  . ANDROGEL PUMP 20.25 MG/ACT (1.62%) GEL Apply 2 Pump topically daily.   Marland Kitchen atorvastatin (LIPITOR) 40 MG tablet TAKE ONE TABLET (40MG TOTAL) BY MOUTH DAILY FOR CHOLESTEROL  . blood glucose meter kit and supplies KIT Dispense based on patient and insurance preference. Use up to four times daily as directed. (FOR ICD-9 250.00, 250.01).  Marland Kitchen buPROPion (WELLBUTRIN SR) 150 MG 12 hr tablet Take 1 tablet (150 mg total) by mouth 2 (two) times daily.  . cetirizine (ZYRTEC) 10 MG tablet Take 1 tablet (10 mg total) by mouth at bedtime. For allergies.  . citalopram (CELEXA) 40 MG tablet TAKE ONE (1) TABLET BY MOUTH EVERY DAY  . cyclobenzaprine (FLEXERIL) 10 MG tablet Take 10 mg by mouth 2 (two) times daily as needed.  . fluticasone (FLONASE) 50 MCG/ACT nasal spray Place 2 sprays into both nostrils daily.  . fluticasone furoate-vilanterol (BREO ELLIPTA) 200-25 MCG/INH AEPB Inhale 1 puff into the lungs daily.  Marland Kitchen gabapentin (NEURONTIN) 300 MG capsule TAKE 2 CAPSULES BY MOUTH TWICE A DAY.  Marland Kitchen glipiZIDE (GLUCOTROL) 10 MG tablet TAKE ONE TABLET TWICE DAILY FOR DIABETES  . insulin degludec (TRESIBA) 100 UNIT/ML FlexTouch Pen Inject 0.2 mLs (20 Units total) into the skin daily.  . Insulin Pen Needle (PEN NEEDLES) 31G X 6 MM MISC Use nightly with insulin.  . Ipratropium-Albuterol (COMBIVENT) 20-100 MCG/ACT AERS respimat Inhale 1 puff into the lungs every 4 (four) hours as needed for wheezing.  Marland Kitchen JARDIANCE 10 MG TABS tablet TAKE ONE TABLET (10MG) BY MOUTH EVERY MORNING FOR DIABETES  . Lancets (ONETOUCH DELICA PLUS [id number]) MISC USE TO  CHECK BLOOD SUGAR UP TO FOUR TIMES DAILY  . metFORMIN (GLUCOPHAGE-XR) 500 MG 24 hr tablet TAKE TWO TABLETS BY MOUTH DAILY WITH BREAKFAST  .  ONETOUCH VERIO test strip USE TO CHECK BLOOD SUGAR UP TO FOUR TIMES DAILY  . tamsulosin (FLOMAX) 0.4 MG CAPS capsule TAKE ONE CAPSULE BY MOUTH DAILY  . tapentadol HCl (NUCYNTA) 75 MG tablet Take 75 mg by mouth 2 (two) times daily.  . Turmeric 500 MG CAPS Take 1 capsule by mouth daily.  Marland Kitchen zolpidem (AMBIEN) 10 MG tablet TAKE ONE TABLET (10MG TOTAL) BY MOUTH ATBEDTIME AS NEEDED FOR SLEEP                Objective:   Physical Exam   08/18/2020     252  01/04/2020        251  12/31/2013  294  >    07/27/2014  286 > 11/29/2014   289> 03/08/2015  284 > 10/03/2015 266 > 10/31/2015 273 > 12/12/2015    280 > 05/23/2017  245 > 11/10/2018  245 >    Vital signs reviewed  08/18/2020  - Note at rest 02 sats  98% on RA    Very pleasant amb obese wm nad   HEENT : pt wearing mask not removed for exam due to covid -19 concerns.    NECK :  without JVD/Nodes/TM/ nl carotid upstrokes bilaterally   LUNGS: no acc muscle use,  Nl contour chest which is clear to A and P bilaterally without cough on insp or exp maneuvers   CV:  RRR  no s3 or murmur or increase in P2, and no edema   ABD:  Obese soft and nontender with nl inspiratory excursion in the supine position. No bruits or organomegaly appreciated, bowel sounds nl  MS:  Nl gait/ ext warm without deformities, calf tenderness, cyanosis or clubbing No obvious joint restrictions   SKIN: warm and dry without lesions    NEURO:  alert, approp, nl sensorium with  no motor or cerebellar deficits apparent.              Assessment & Plan:

## 2020-08-22 ENCOUNTER — Telehealth: Payer: Self-pay

## 2020-08-22 NOTE — Telephone Encounter (Signed)
Joseph [name] [name] - Rx #: [id number] Need help? Call us at [phone] Status Sent to [employer] [facility] [employer] 14 Day Sensor Form [facility] Electronic PA Form Original Claim Info [phone] 01CMS EXCLUDED DRUG 02USE [phone] IF NO MEDICAID/STATE COV 03P

## 2020-09-01 ENCOUNTER — Ambulatory Visit: Payer: Medicare HMO | Admitting: Gastroenterology

## 2020-09-13 ENCOUNTER — Other Ambulatory Visit: Payer: Self-pay | Admitting: Primary Care

## 2020-09-13 DIAGNOSIS — F419 Anxiety disorder, unspecified: Secondary | ICD-10-CM

## 2020-09-15 ENCOUNTER — Other Ambulatory Visit: Payer: Self-pay

## 2020-09-15 DIAGNOSIS — F419 Anxiety disorder, unspecified: Secondary | ICD-10-CM

## 2020-09-15 MED ORDER — CITALOPRAM HYDROBROMIDE 40 MG PO TABS: ORAL_TABLET | ORAL | 0 refills | Status: DC

## 2020-09-22 ENCOUNTER — Other Ambulatory Visit: Payer: Self-pay | Admitting: Primary Care

## 2020-09-22 DIAGNOSIS — E291 Testicular hypofunction: Secondary | ICD-10-CM | POA: Diagnosis not present

## 2020-09-22 DIAGNOSIS — Z125 Encounter for screening for malignant neoplasm of prostate: Secondary | ICD-10-CM | POA: Diagnosis not present

## 2020-09-22 DIAGNOSIS — E119 Type 2 diabetes mellitus without complications: Secondary | ICD-10-CM

## 2020-09-22 DIAGNOSIS — N529 Male erectile dysfunction, unspecified: Secondary | ICD-10-CM | POA: Diagnosis not present

## 2020-10-01 ENCOUNTER — Other Ambulatory Visit: Payer: Self-pay | Admitting: Internal Medicine

## 2020-10-05 ENCOUNTER — Telehealth: Payer: Self-pay | Admitting: Primary Care

## 2020-10-05 DIAGNOSIS — E119 Type 2 diabetes mellitus without complications: Secondary | ICD-10-CM

## 2020-10-05 NOTE — Chronic Care Management (AMB) (Signed)
  Chronic Care Management   Note  10/05/2020 Name: Joseph [name] MRN: [id number] DOB: September 16, 1971  Joseph [name] is a 49 y.o. year old male who is a primary care patient of Doreene Nest, NP. I reached out to [name] by phone today in response to a referral sent by [name]'s PCP, Doreene Nest, NP.   Joseph [name] was given information about Chronic Care Management services today including:  1. CCM service includes personalized support from designated clinical staff supervised by his physician, including individualized plan of care and coordination with other care providers 2. 24/7 contact phone numbers for assistance for urgent and routine care needs. 3. Service will only be billed when office clinical staff spend 20 minutes or more in a month to coordinate care. 4. Only one practitioner may furnish and bill the service in a calendar month. 5. The patient may stop CCM services at any time (effective at the end of the month) by phone call to the office staff.   Patient agreed to services and verbal consent obtained.   Follow up plan:   [name]  Upstream Scheduler

## 2020-10-05 NOTE — Progress Notes (Signed)
  Chronic Care Management   Outreach Note  10/05/2020 Name: Joseph [name] MRN: [id number] DOB: 07-05-1971  Referred by: Doreene Nest, NP Reason for referral : Chronic Care Management   An unsuccessful telephone outreach was attempted today. The patient was referred to the pharmacist for assistance with care management and care coordination.   Follow Up Plan:   [name]  Upstream Scheduler

## 2020-10-06 ENCOUNTER — Other Ambulatory Visit: Payer: Self-pay

## 2020-10-06 DIAGNOSIS — E119 Type 2 diabetes mellitus without complications: Secondary | ICD-10-CM

## 2020-10-09 ENCOUNTER — Ambulatory Visit: Payer: Medicare HMO | Admitting: Primary Care

## 2020-10-12 ENCOUNTER — Telehealth (INDEPENDENT_AMBULATORY_CARE_PROVIDER_SITE_OTHER): Payer: Medicare HMO | Admitting: Family Medicine

## 2020-10-12 ENCOUNTER — Other Ambulatory Visit: Payer: Self-pay | Admitting: Primary Care

## 2020-10-12 ENCOUNTER — Encounter: Payer: Self-pay | Admitting: Family Medicine

## 2020-10-12 DIAGNOSIS — J0140 Acute pansinusitis, unspecified: Secondary | ICD-10-CM

## 2020-10-12 DIAGNOSIS — Z20822 Contact with and (suspected) exposure to covid-19: Secondary | ICD-10-CM | POA: Diagnosis not present

## 2020-10-12 DIAGNOSIS — J302 Other seasonal allergic rhinitis: Secondary | ICD-10-CM

## 2020-10-12 MED ORDER — BENZONATATE 100 MG PO CAPS: 100.0000 mg | ORAL_CAPSULE | Freq: Three times a day (TID) | ORAL | 0 refills | Status: DC | PRN

## 2020-10-12 MED ORDER — AMOXICILLIN-POT CLAVULANATE 875-125 MG PO TABS: 1.0000 | ORAL_TABLET | Freq: Two times a day (BID) | ORAL | 0 refills | Status: AC

## 2020-10-12 NOTE — Progress Notes (Signed)
I connected with [name] on 10/12/20 at  8:00 AM EST by video and verified that I am speaking with the correct person using two identifiers.   I discussed the limitations, risks, security and privacy concerns of performing an evaluation and management service by video and the availability of in person appointments. I also discussed with the patient that there may be a patient responsible charge related to this service. The patient expressed understanding and agreed to proceed.  Patient location: On work site for job Provider Location: [employer] Participants: Lynnda Child and CONLIN BRAHM   Subjective:     SHLOMIE ROMIG is a 49 y.o. male presenting for Cough (all x 4 days  ), Sinusitis (yellow mucous), Headache, and Fatigue     Cough This is a new problem. The current episode started in the past 7 days. The cough is productive of sputum. Associated symptoms include a fever (99.5), headaches, nasal congestion, a sore throat and shortness of breath (with coughing). Pertinent negatives include no chest pain, chills or myalgias. Nothing aggravates the symptoms. Treatments tried: theraflu, robutussin w/o help. The treatment provided mild relief.  Sinusitis Associated symptoms include coughing, headaches, shortness of breath (with coughing) and a sore throat. Pertinent negatives include no chills.  Headache  Associated symptoms include coughing, a fever (99.5) and a sore throat.   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 Negative for chills.  HENT: Positive for sore throat.   Respiratory: Positive for cough and shortness of breath (with coughing).   Cardiovascular: Negative for chest pain.  Musculoskeletal: Negative for myalgias.  Neurological: Positive for headaches.     Social  History   Tobacco Use  Smoking Status Current Every Day Smoker  . Packs/day: 1.00  . Years: 26.00  . Pack years: 26.00  . Types: Cigarettes  . Start date: 12/17/1993  Smokeless Tobacco Never Used  Tobacco Comment   1 ppd 01/04/2020        Objective:   BP Readings from Last 3 Encounters:  08/18/20 126/74  07/05/20 122/80  06/21/20 124/82   Wt Readings from Last 3 Encounters:  08/18/20 252 lb (114.3 kg)  07/05/20 254 lb 4 oz (115.3 kg)  06/21/20 250 lb (113.4 kg)    There were no vitals taken for this visit.   Speaking in complete sentences No acute distress        Assessment & Plan:   Problem List Items Addressed This Visit    None    Visit Diagnoses    Suspected COVID-19 virus infection    -  Primary   Relevant Medications   benzonatate (TESSALON PERLES) 100 MG capsule   Acute non-recurrent pansinusitis       Relevant Medications   amoxicillin-clavulanate (AUGMENTIN) 875-125 MG tablet   benzonatate (TESSALON PERLES) 100 MG capsule     Discussed OTC treatment for viral illness Patient will get covid testing at nearby [facility] or [facility] Instructed to isolate until the results come back  If negative and symptoms persisting will start antibiotics for possible sinus infection  If positive he will call our office as he would be a candidate for monoclonal antibody infusion  Interactive audio and video telecommunications were attempted between this provider and patient, however failed, due to patient  having technical difficulties OR patient did not have access to video capability.  We continued and completed visit with audio only.   Start Time: 8:14 End Time: 8:26    Return if symptoms worsen or fail to improve.  Lynnda Child, MD

## 2020-10-13 ENCOUNTER — Encounter: Payer: Self-pay | Admitting: Family Medicine

## 2020-10-18 NOTE — Progress Notes (Deleted)
Referring Provider: Primary Care Physician:  Pleas Koch, NP Primary Gastroenterologist:  Dr. Abbey Chatters  No chief complaint on file.   HPI:   Joseph [name] is a 49 y.o. male presenting today at the request of Pleas Koch, NP for colon cancer screening.  Recommended office visit due to medications.  Last colonoscopy 10/04/2009 with slightly tortuous colon requiring prone position in order to intubate the cecum, 3 mm hyperplastic sigmoid colon polyp, small internal hemorrhoids.  Today:   ASAIII  Past Medical History:  Diagnosis Date  . Acute encephalopathy 03/24/2018  . Anxiety   . Arthritis    oa left knee and lower back  . Asthma   . Cataract    hx of bilateral  . Chronic back pain   . Chronic pain of left knee   . COPD (chronic obstructive pulmonary disease) ([place])   . Diabetes mellitus    type 2  . Encephalopathy acute 03/24/2018  . GERD (gastroesophageal reflux disease)   . INGUINAL PAIN, LEFT 10/03/2009   Qualifier: Diagnosis of  By: Oneida Alar MD, Sandi L   . Oral thrush 11/10/2018  . Overdose    03-24-18 accidental  . Shortness of breath    with heavy exertion  . Sleep apnea     Past Surgical History:  Procedure Laterality Date  . CARPAL TUNNEL RELEASE Left   . CATARACT EXTRACTION W/PHACO Right 03/11/2016   Procedure: CATARACT EXTRACTION PHACO AND INTRAOCULAR LENS PLACEMENT (IOC);  Surgeon: Tonny Branch, MD;  Location: [facility];  Service: Ophthalmology;  Laterality: Right;  CDE 4.24  . EYE SURGERY Right 2018   ioc with lens replacement  . HERNIA REPAIR Bilateral 6222   umbilical  . KNEE ARTHROSCOPY WITH MEDIAL MENISECTOMY Left 04/22/2014   Procedure: LEFT KNEE ARTHROSCOPY WITH MEDIAL MENISECTOMY;  Surgeon: Carole Civil, MD;  Location: [facility];  Service: Orthopedics;  Laterality: Left;  . KNEE ARTHROSCOPY WITH MEDIAL MENISECTOMY Right 12/06/2015   Procedure: RIGHT KNEE ARTHROSCOPY WITH MEDIAL MENISECTOMY;  Surgeon: Carole Civil, MD;  Location: [facility];   Service: Orthopedics;  Laterality: Right;  . KNEE ARTHROSCOPY WITH MEDIAL MENISECTOMY Left 03/20/2017   Procedure: KNEE ARTHROSCOPY WITH MEDIAL MENISECTOMY;  Surgeon: Carole Civil, MD;  Location: [facility];  Service: Orthopedics;  Laterality: Left;  . ORIF WRIST FRACTURE Left 12/19/2016   Procedure: OPEN REDUCTION INTERNAL FIXATION (ORIF) WRIST FRACTURE;  Surgeon: Leanora Cover, MD;  Location: [place] [facility];  Service: Orthopedics;  Laterality: Left;  accumed   . PARTIAL KNEE ARTHROPLASTY Left 07/21/2018   Procedure: LEFT UNICOMPARTMENTAL KNEE;  Surgeon: Renette Butters, MD;  Location: [facility];  Service: Orthopedics;  Laterality: Left;  . SHOULDER ARTHROCENTESIS Right 2008 or 2010  . VASECTOMY  2014    Current Outpatient Medications  Medication Sig Dispense Refill  . albuterol (PROVENTIL) (2.5 MG/3ML) 0.083% nebulizer solution USE 3 ML VIA NEBULIZER FOUR TIMES DAILY AS NEEDED FOR SHORTNESS OF BREATH 360 mL 5  . amoxicillin-clavulanate (AUGMENTIN) 875-125 MG tablet Take 1 tablet by mouth 2 (two) times daily for 7 days. 14 tablet 0  . ANDROGEL PUMP 20.25 MG/ACT (1.62%) GEL Apply 2 Pump topically daily.     Marland Kitchen atorvastatin (LIPITOR) 40 MG tablet TAKE ONE TABLET (40MG TOTAL) BY MOUTH DAILY FOR CHOLESTEROL 90 tablet 3  . benzonatate (TESSALON PERLES) 100 MG capsule Take 1 capsule (100 mg total) by mouth 3 (three) times daily as needed for cough. 30 capsule 0  . blood glucose  meter kit and supplies KIT Dispense based on patient and insurance preference. Use up to four times daily as directed. (FOR ICD-9 250.00, 250.01). 1 each 0  . buPROPion (WELLBUTRIN SR) 150 MG 12 hr tablet Take 1 tablet (150 mg total) by mouth 2 (two) times daily. 180 tablet 3  . cetirizine (ZYRTEC) 10 MG tablet TAKE ONE TABLET (10MG TOTAL) BY MOUTH ATBEDTIME FOR ALLERGIES 90 tablet 1  . citalopram (CELEXA) 40 MG tablet TAKE ONE (1) TABLET BY MOUTH EVERY DAY 90 tablet 0  . cyclobenzaprine (FLEXERIL) 10 MG tablet Take 10  mg by mouth 2 (two) times daily as needed.    . fluticasone (FLONASE) 50 MCG/ACT nasal spray Place 2 sprays into both nostrils daily. 16 g 0  . fluticasone furoate-vilanterol (BREO ELLIPTA) 200-25 MCG/INH AEPB Inhale 1 puff into the lungs daily. 60 each 5  . gabapentin (NEURONTIN) 300 MG capsule TAKE 2 CAPSULES BY MOUTH TWICE A DAY. 360 capsule 1  . glipiZIDE (GLUCOTROL) 10 MG tablet TAKE ONE TABLET TWICE DAILY FOR DIABETES 180 tablet 3  . insulin degludec (TRESIBA) 100 UNIT/ML FlexTouch Pen Inject 0.2 mLs (20 Units total) into the skin daily. 15 mL 2  . Insulin Pen Needle (PEN NEEDLES) 31G X 6 MM MISC Use nightly with insulin. 100 each 5  . Ipratropium-Albuterol (COMBIVENT) 20-100 MCG/ACT AERS respimat Inhale 1 puff into the lungs every 4 (four) hours as needed for wheezing. 4 g 11  . JARDIANCE 10 MG TABS tablet TAKE ONE TABLET (10MG) BY MOUTH EVERY MORNING FOR DIABETES 90 tablet 1  . Lancets (ONETOUCH DELICA PLUS [id number]) MISC USE TO CHECK BLOOD SUGAR UP TO FOUR TIMES DAILY 100 each 5  . metFORMIN (GLUCOPHAGE-XR) 500 MG 24 hr tablet TAKE TWO TABLETS BY MOUTH DAILY WITH BREAKFAST 180 tablet 1  . ONETOUCH VERIO test strip USE TO CHECK BLOOD SUGAR UP TO FOUR TIMES DAILY 100 strip 5  . tamsulosin (FLOMAX) 0.4 MG CAPS capsule TAKE ONE CAPSULE BY MOUTH DAILY 90 capsule 1  . tapentadol HCl (NUCYNTA) 75 MG tablet Take 75 mg by mouth 2 (two) times daily.    . Turmeric 500 MG CAPS Take 1 capsule by mouth daily.    Marland Kitchen zolpidem (AMBIEN) 10 MG tablet TAKE ONE TABLET (10MG TOTAL) BY MOUTH ATBEDTIME AS NEEDED FOR SLEEP 90 tablet 0   No current facility-administered medications for this visit.    Allergies as of 10/19/2020 - Review Complete 10/12/2020  Allergen Reaction Noted  . Azithromycin Hives 10/03/2009  . Clarithromycin  03/10/2018  . Erythromycin Hives 10/03/2009  . Keflex [cephalexin] Nausea Only 12/09/2013  . Metformin Nausea And Vomiting 10/03/2009  . Symbicort [budesonide-formoterol  fumarate] Other (See Comments) 08/18/2013    Family History  Problem Relation Age of Onset  . Emphysema Maternal Grandfather   . Emphysema Maternal Grandmother   . Clotting disorder Maternal Grandmother     Social History   Socioeconomic History  . Marital status: Divorced    Spouse name: Not on file  . Number of children: Not on file  . Years of education: Not on file  . Highest education level: Not on file  Occupational History  . Occupation: disability  Tobacco Use  . Smoking status: Current Every Day Smoker    Packs/day: 1.00    Years: 26.00    Pack years: 26.00    Types: Cigarettes    Start date: 12/17/1993  . Smokeless tobacco: Never Used  . Tobacco comment: 1 ppd 01/04/2020  Vaping  Use  . Vaping Use: Some days  Substance and Sexual Activity  . Alcohol use: Never    Alcohol/week: 0.0 standard drinks  . Drug use: Never  . Sexual activity: Yes    Birth control/protection: Surgical  Other Topics Concern  . Not on file  Social History Narrative   Recent visit to [facility] in [place] d/t increased pain where he was prescribed percocet 7.5-325 mg for pain.   Social Determinants of Health   Financial Resource Strain:   . Difficulty of Paying Living Expenses: Not on file  Food Insecurity:   . Worried About Charity fundraiser in the Last Year: Not on file  . Ran Out of Food in the Last Year: Not on file  Transportation Needs:   . Lack of Transportation (Medical): Not on file  . Lack of Transportation (Non-Medical): Not on file  Physical Activity:   . Days of Exercise per Week: Not on file  . Minutes of Exercise per Session: Not on file  Stress:   . Feeling of Stress : Not on file  Social Connections:   . Frequency of Communication with Friends and Family: Not on file  . Frequency of Social Gatherings with Friends and Family: Not on file  . Attends Religious Services: Not on file  . Active Member of Clubs or Organizations: Not on file  . Attends Theatre manager Meetings: Not on file  . Marital Status: Not on file  Intimate Partner Violence:   . Fear of Current or Ex-Partner: Not on file  . Emotionally Abused: Not on file  . Physically Abused: Not on file  . Sexually Abused: Not on file    Review of Systems: Gen: Denies any fever, chills, fatigue, weight loss, lack of appetite.  CV: Denies chest pain, heart palpitations, peripheral edema, syncope.  Resp: Denies shortness of breath at rest or with exertion. Denies wheezing or cough.  GI: Denies dysphagia or odynophagia. Denies jaundice, hematemesis, fecal incontinence. GU : Denies urinary burning, urinary frequency, urinary hesitancy MS: Denies joint pain, muscle weakness, cramps, or limitation of movement.  Derm: Denies rash, itching, dry skin Psych: Denies depression, anxiety, memory loss, and confusion Heme: Denies bruising, bleeding, and enlarged lymph nodes.  Physical Exam: There were no vitals taken for this visit. General:   Alert and oriented. Pleasant and cooperative. Well-nourished and well-developed.  Head:  Normocephalic and atraumatic. Eyes:  Without icterus, sclera clear and conjunctiva pink.  Ears:  Normal auditory acuity. Nose:  No deformity, discharge,  or lesions. Mouth:  No deformity or lesions, oral mucosa pink.  Neck:  Supple, without mass or thyromegaly. Lungs:  Clear to auscultation bilaterally. No wheezes, rales, or rhonchi. No distress.  Heart:  S1, S2 present without murmurs appreciated.  Abdomen:  +BS, soft, non-tender and non-distended. No HSM noted. No guarding or rebound. No masses appreciated.  Rectal:  Deferred  Msk:  Symmetrical without gross deformities. Normal posture. Pulses:  Normal pulses noted. Extremities:  Without clubbing or edema. Neurologic:  Alert and  oriented x4;  grossly normal neurologically. Skin:  Intact without significant lesions or rashes. Cervical Nodes:  No significant cervical adenopathy. Psych:  Alert and  cooperative. Normal mood and affect.

## 2020-10-19 ENCOUNTER — Encounter: Payer: Self-pay | Admitting: Gastroenterology

## 2020-10-19 ENCOUNTER — Ambulatory Visit: Payer: Medicare HMO | Admitting: Gastroenterology

## 2020-10-23 ENCOUNTER — Other Ambulatory Visit: Payer: Self-pay | Admitting: Primary Care

## 2020-10-23 DIAGNOSIS — G4700 Insomnia, unspecified: Secondary | ICD-10-CM

## 2020-10-23 DIAGNOSIS — G8929 Other chronic pain: Secondary | ICD-10-CM

## 2020-10-24 DIAGNOSIS — M545 Low back pain, unspecified: Secondary | ICD-10-CM | POA: Diagnosis not present

## 2020-10-24 DIAGNOSIS — M5412 Radiculopathy, cervical region: Secondary | ICD-10-CM | POA: Diagnosis not present

## 2020-10-24 DIAGNOSIS — Z79891 Long term (current) use of opiate analgesic: Secondary | ICD-10-CM | POA: Diagnosis not present

## 2020-10-24 DIAGNOSIS — M542 Cervicalgia: Secondary | ICD-10-CM | POA: Diagnosis not present

## 2020-10-25 NOTE — Telephone Encounter (Signed)
Refills sent to pharmacy. 

## 2020-10-25 NOTE — Telephone Encounter (Signed)
LAST APPOINTMENT DATE: 07/05/2020 CPE    NEXT APPOINTMENT DATE: N/A    LAST REFILL:07/19/2020  QTY: #90 no rf

## 2020-11-06 ENCOUNTER — Other Ambulatory Visit: Payer: Self-pay | Admitting: Primary Care

## 2020-11-06 DIAGNOSIS — K219 Gastro-esophageal reflux disease without esophagitis: Secondary | ICD-10-CM

## 2020-11-06 DIAGNOSIS — E119 Type 2 diabetes mellitus without complications: Secondary | ICD-10-CM

## 2020-11-06 NOTE — Telephone Encounter (Signed)
Refills sent to pharmacy. 

## 2020-11-06 NOTE — Telephone Encounter (Signed)
Pharmacy requests refill on: Metformin 500 mg 24 hr   LAST REFILL: 05/16/2020 (Q-180, R-1) LAST OV: 07/05/2020 NEXT OV: Not Scheduled  PHARMACY: [facility]  Hgb A1C (08/22/2020): 10.8

## 2020-11-07 ENCOUNTER — Other Ambulatory Visit: Payer: Self-pay | Admitting: Primary Care

## 2020-11-07 DIAGNOSIS — K219 Gastro-esophageal reflux disease without esophagitis: Secondary | ICD-10-CM

## 2020-11-07 MED ORDER — LANSOPRAZOLE 30 MG PO CPDR: 30.0000 mg | DELAYED_RELEASE_CAPSULE | Freq: Two times a day (BID) | ORAL | 1 refills | Status: DC

## 2020-11-08 NOTE — Telephone Encounter (Signed)
Think I sent message on this yesterday.

## 2020-11-09 ENCOUNTER — Other Ambulatory Visit: Payer: Self-pay

## 2020-11-09 ENCOUNTER — Ambulatory Visit: Payer: Medicare HMO

## 2020-11-09 DIAGNOSIS — E785 Hyperlipidemia, unspecified: Secondary | ICD-10-CM

## 2020-11-09 DIAGNOSIS — E119 Type 2 diabetes mellitus without complications: Secondary | ICD-10-CM

## 2020-11-09 NOTE — Chronic Care Management (AMB) (Signed)
Chronic Care Management Pharmacy  Name: Joseph [name]  MRN: [id number] DOB: January 16, 1971  Chief Complaint/ HPI  Joseph [name],  49 y.o. , male presents for their Initial CCM visit with the clinical pharmacist via telephone. Patient was in a hurry today. Focused on uncontrolled diabetes.  PCP : Joseph Koch, NP  Their chronic conditions include: OSA, asthma, COPD, chronic constipation, GERD, diabetes, OA, HLD, insomnia   CCM consent - 10/05/20  Patient concerns: reports 12 surgeries since 2008, partial left knee replacement causing some pain, pain management handles this well. On Nucynta and Flexeril - doing well with these.  Office Visits:  11/07/20: Pt message requesting refill Prevacid 30 mg BID before meals  10/05/20: Pt message requesting insulin refill  07/05/20: PCP visit - rx for chantix sent last visit, pt has not filled, continue current medications  06/21/20: PCP visit diabetes - increase insulin to 20 units  Consult Visit:  09/22/20: Urology - male hypogonadism, taking Androgel and Flomax, ED cialis PRN, denies LUTS, meds refilled, follow up in 1 year  08/18/20: Pulmonary - active smoker, tried Chantix (dreams), not vaccinated, breathing unchanged since last visit, Combivent 3-4x/week, cough improving, problems with CPAP, strongly recommend COVID vaccine, continue current medications  Allergies  Allergen Reactions   Azithromycin Hives   Clarithromycin    Erythromycin Hives   Keflex [Cephalexin] Nausea Only   Metformin Nausea And Vomiting   Symbicort [Budesonide-Formoterol Fumarate] Other (See Comments)    States that 3 doses were used and breathing became worse   Medications: Outpatient Encounter Medications as of 11/09/2020  Medication Sig   albuterol (PROVENTIL) (2.5 MG/3ML) 0.083% nebulizer solution USE 3 ML VIA NEBULIZER FOUR TIMES DAILY AS NEEDED FOR SHORTNESS OF BREATH   ANDROGEL PUMP 20.25 MG/ACT (1.62%) GEL Apply 2 Pump topically daily.     atorvastatin (LIPITOR) 40 MG tablet TAKE ONE TABLET (40MG TOTAL) BY MOUTH DAILY FOR CHOLESTEROL   benzonatate (TESSALON PERLES) 100 MG capsule Take 1 capsule (100 mg total) by mouth 3 (three) times daily as needed for cough.   blood glucose meter kit and supplies KIT Dispense based on patient and insurance preference. Use up to four times daily as directed. (FOR ICD-9 250.00, 250.01).   buPROPion (WELLBUTRIN SR) 150 MG 12 hr tablet Take 1 tablet (150 mg total) by mouth 2 (two) times daily.   cetirizine (ZYRTEC) 10 MG tablet TAKE ONE TABLET (10MG TOTAL) BY MOUTH ATBEDTIME FOR ALLERGIES   citalopram (CELEXA) 40 MG tablet TAKE ONE (1) TABLET BY MOUTH EVERY DAY   cyclobenzaprine (FLEXERIL) 10 MG tablet Take 10 mg by mouth 2 (two) times daily as needed.   fluticasone (FLONASE) 50 MCG/ACT nasal spray Place 2 sprays into both nostrils daily.   gabapentin (NEURONTIN) 300 MG capsule TAKE 2 CAPSULES BY MOUTH TWICE A DAY.   glipiZIDE (GLUCOTROL) 10 MG tablet TAKE ONE TABLET TWICE DAILY FOR DIABETES   Insulin Pen Needle (PEN NEEDLES) 31G X 6 MM MISC Use nightly with insulin.   Ipratropium-Albuterol (COMBIVENT) 20-100 MCG/ACT AERS respimat Inhale 1 puff into the lungs every 4 (four) hours as needed for wheezing.   JARDIANCE 10 MG TABS tablet TAKE ONE TABLET (10MG) BY MOUTH EVERY MORNING FOR DIABETES   Lancets (ONETOUCH DELICA PLUS [id number]) MISC USE TO CHECK BLOOD SUGAR UP TO FOUR TIMES DAILY   lansoprazole (PREVACID) 30 MG capsule Take 1 capsule (30 mg total) by mouth 2 (two) times daily before a meal.   metFORMIN (GLUCOPHAGE-XR) 500 MG 24  hr tablet Take 2 tablets (1,000 mg total) by mouth daily with breakfast. For diabetes.   ONETOUCH VERIO test strip USE TO CHECK BLOOD SUGAR UP TO FOUR TIMES DAILY   tamsulosin (FLOMAX) 0.4 MG CAPS capsule TAKE ONE CAPSULE BY MOUTH DAILY   tapentadol HCl (NUCYNTA) 75 MG tablet Take 75 mg by mouth 2 (two) times daily.   Turmeric 500 MG CAPS Take 1 capsule  by mouth daily.   zolpidem (AMBIEN) 10 MG tablet TAKE ONE TABLET ($RemoveBef'10MG'[id number]$  TOTAL) BY MOUTH ATBEDTIME AS NEEDED FOR SLEEP   [DISCONTINUED] fluticasone furoate-vilanterol (BREO ELLIPTA) 200-25 MCG/INH AEPB Inhale 1 puff into the lungs daily.   [DISCONTINUED] insulin degludec (TRESIBA) 100 UNIT/ML FlexTouch Pen Inject 0.2 mLs (20 Units total) into the skin daily.   No facility-administered encounter medications on file as of 11/09/2020.    Current Diagnosis/Assessment:  SDOH Interventions   Flowsheet Row Most Recent Value  SDOH Interventions   Financial Strain Interventions Intervention Not Indicated     Goals Addressed            This Visit's Progress    Pharmacy Care Plan       CARE PLAN ENTRY (see longitudinal plan of care for additional care plan information)  Current Barriers:   Chronic Disease Management support, education, and care coordination needs related to Hyperlipidemia and Diabetes   Hyperlipidemia Lab Results  Component Value Date/Time   LDLCALC Comment 08/02/2016 09:47 AM   LDLDIRECT 78.0 05/03/2020 02:54 PM    Pharmacist Clinical Goal(s): o Over the next 30 days, patient will work with PharmD and providers to maintain LDL goal < 100  Current regimen:  o Atorvastatin 40 mg - 1 tablet daily  Interventions: o Reviewed recent labs - triglycerides elevated o Recommend diabetes control to improve TG  Patient self care activities - Over the next 30 days, patient will: o Continue medication as prescribed o Slowly increase exercise with goal of 30 minutes, 5 days per week o Incorporate a healthy diet high in vegetables, fruits and whole grains with low-fat dairy products, chicken, fish, legumes, non-tropical vegetable oils and nuts. Limit intake of sweets, sugar-sweetened beverages and red meats.   Diabetes Lab Results  Component Value Date/Time   HGBA1C 9.6 (A) 05/03/2020 02:47 PM   HGBA1C 9.4 (A) 12/27/2019 08:58 AM   HGBA1C 10.8 (H) 08/23/2019 08:57 AM    HGBA1C 9.9 (H) 05/21/2019 09:13 AM    Pharmacist Clinical Goal(s): o Over the next 30 days, patient will work with PharmD and providers to achieve A1c goal <7%  Current regimen:   Metformin XR 500 mg  - 2 tablets daily  Jardiance 10 mg - 1 tablet daily  Glipizide 10 mg BID  Tresiba - Inject 25 units daily  Freestyle Libre   Interventions: o Recommend checking BG prior to breakfast, 2 hours after meals, bedtime, and with symptoms of hypoglycemia - refill sensors for [employer] increasing metformin o Recommend and discussed trial of Ozempic after trial of metformin increased dose  Patient self care activities - Over the next 30 days, patient will: o Check blood sugar 3-4 times daily, document, and provide at future appointments o Increase metformin to 2 tablets twice daily with meals pending PCP consult o Contact provider with any episodes of hypoglycemia  Initial goal documentation       COPD / Asthma / Tobacco   Followed by Dr. Melvyn Novas  Eosinophil count:   Lab Results  Component Value Date/Time   EOSPCT 1.6  05/04/2020 02:59 PM  %                               Eos (Absolute):  Lab Results  Component Value Date/Time   EOSABS 0.3 03/24/2018 05:58 PM    Tobacco Status:  Social History   Tobacco Use  Smoking Status Current Every Day Smoker   Packs/day: 1.00   Years: 26.00   Pack years: 26.00   Types: Cigarettes   Start date: 12/17/1993  Smokeless Tobacco Never Used  Tobacco Comment   1 ppd 01/04/2020   Patient is currently on the following medications:   Breo Ellipta 200-25 mcg - 1 puff daily  Albuterol sulfate solution - 3 mL QID PRN  Combivent Respimat 20-100 mcg/act - 1 puff q4h PRN  Cetirizine 10 mg  - 1 tablet daily  Benzonatate 100 mg - 1 capsule TID PRN cough  Pt does not have Chantix - threw it away due to recall Current tobacco use: 1 to 1 and 1/2 PPD  Reports driving is main trigger  Plan: Full assessment at follow up.  Continue follow up with pulmonology. Consider tobacco cessation.  Diabetes   Allergies  Allergen Reactions   Azithromycin Hives   Clarithromycin    Erythromycin Hives   Keflex [Cephalexin] Nausea Only   Metformin Nausea And Vomiting   Symbicort [Budesonide-Formoterol Fumarate] Other (See Comments)    States that 3 doses were used and breathing became worse   Lab Results  Component Value Date   CREATININE 0.94 05/03/2020   BUN 6 05/03/2020   GFR 84.95 05/03/2020   GFRNONAA >60 07/13/2018   GFRAA >60 07/13/2018   NA 136 05/03/2020   K 3.3 (L) 05/03/2020   CALCIUM 9.4 05/03/2020   CO2 27 05/03/2020   Recent Relevant Labs: Lab Results  Component Value Date/Time   HGBA1C 9.6 (A) 05/03/2020 02:47 PM   HGBA1C 9.4 (A) 12/27/2019 08:58 AM   HGBA1C 10.8 (H) 08/23/2019 08:57 AM   HGBA1C 9.9 (H) 05/21/2019 09:13 AM   MICROALBUR <0.7 05/03/2020 02:49 PM   MICROALBUR <0.7 05/21/2019 09:13 AM   Per PCP visit 06/21/20: He is checking his blood glucose several times daily and is getting readings raging mid 100's to low 300's. Uncontrolled but seems to be getting better. Increase Tresiba to 20 units now, may increase further to 25 units after 2-3 weeks if glucose readings consistently remain above 150. Urine micro negative in 2021. Foot exam today. He will schedule eye exam. Managed on statin.  Patient has failed these meds in past: reports when he was on IR metformin, has some stomach upset  Patient is currently uncontrolled on the following medications:   Metformin XR 500 mg  - 2 tablets daily  Jardiance 10 mg - 1 tablet daily  Glipizide 10 mg BID  Tresiba - Inject 25 units daily  Lab Results  Component Value Date/Time   HMDIABEYEEXA No Retinopathy 11/03/2017 12:00 AM    Foot exam history not found  We discussed: Pt reports doing much better since increasing insulin to 25 units daily. Reports biggest problems is skipping meals - usually doesn't eat breakfast and sometimes  lunch. Keeps glucose tabs with him in case of hypoglycemia. Reports will take glucose tabs if feeling symptoms. Doesn't always have the meter with him. Previously used CGM freestyle libre 1. Pays cash for sensors - $50 each, currently out of sensors. Has not used a glucometer recently. Does  not recall the last time he checked sugars. Denies recent s/s of hypoglycemia.   Pt reports meds are 100% covered -  encouraged patient to refill sensors ASAP. Patient is open to trying increased metformin dose since his n/v was with the IR formulation. Recommend increasing to 2 tabs BID with meals pending PCP consult.  Pt reports he would like to be off insulin and is interested in trying Ozempic. Denies history of pancreatitis, thyroid cancer   Plan: Consult PCP to increase metformin. F/u 2 weeks to consider starting Ozempic.   Hyperlipidemia   LDL goal < 100, TG < 150, HDL > 40  Last lipids Lab Results  Component Value Date   CHOL 149 05/03/2020   HDL 32.50 (L) 05/03/2020   LDLCALC Comment 08/02/2016   LDLDIRECT 78.0 05/03/2020   TRIG 353.0 (H) 05/03/2020   CHOLHDL 5 05/03/2020   Hepatic Function Latest Ref Rng & Units 05/03/2020 03/08/2019 03/25/2018  Total Protein 6.0 - 8.3 g/dL 6.9 7.3 6.1(L)  Albumin 3.5 - 5.2 g/dL 4.6 4.6 3.6  AST 0 - 37 U/L 13 21 11(L)  ALT 0 - 53 U/L 18 20 13(L)  Alk Phosphatase 39 - 117 U/L 98 109 67  Total Bilirubin 0.2 - 1.2 mg/dL 0.6 0.5 0.8  Bilirubin, Direct 0.00 - 0.40 mg/dL - - -     The 10-year ASCVD risk score Mikey Bussing DC Jr., et al., 2013) is: 14.8%   Values used to calculate the score:     Age: 51 years     Sex: Male     Is Non-Hispanic African American: No     Diabetic: Yes     Tobacco smoker: Yes     Systolic Blood Pressure: 027 mmHg     Is BP treated: No     HDL Cholesterol: 32.5 mg/dL     Total Cholesterol: 149 mg/dL   Patient has failed these meds in past: none reported Patient is currently uncontrolled (LDL controlled, TG high) on the following  medications:   Atorvastatin 40 mg - 1 tablet daily  We discussed:  TG elevated likely secondary uncontrolled diabetes - work on diabetes control. LDL at goal  Plan: Continue current medications  Depression   Patient is currently on the following medications:   Citalopram 20 mg - 1 tablet daily  Bupropion - 1 tablet daily  Plan: Assess at follow up  Insomnia   Patient is currently on the following medications:   Ambien 10 mg - 1 tablet qhs PRN  Plan: Assess at follow up  Vaccines   Reviewed and discussed patient's vaccination history.    Immunization History  Administered Date(s) Administered   Influenza Split 09/02/2011   Influenza,inj,Quad PF,6+ Mos 12/01/2014, 08/22/2015, 09/03/2016, 10/21/2017, 11/10/2018, 08/27/2019   Pneumococcal Polysaccharide-23 12/03/2007, 11/10/2018   Td 10/21/2017   Zoster Recombinat (Shingrix) 07/05/2020   Patient declines COVID vaccine.  Plan: Recommended patient consider COVID-19 vaccine.  Follow up: CCM call 1 month, 12/11/2020 3:30 PM; CMA call in 2 weeks to assess metformin tolerance.   Debbora Dus, PharmD Clinical Pharmacist [facility] [phone]

## 2020-11-15 MED ORDER — INSULIN DEGLUDEC 100 UNIT/ML ~~LOC~~ SOPN: 25.0000 [IU] | PEN_INJECTOR | Freq: Every day | SUBCUTANEOUS | 5 refills | Status: DC

## 2020-11-15 NOTE — Telephone Encounter (Signed)
[facility] pharmacy called requesting refill on [place] stating that the dose was increased.... previous dose was 20 units and this note states pt had been using 25 units.... it was not changed in chart... Please advise if appropriate to change Rx to 25 units and will need a refill sent to the pharmacy

## 2020-11-15 NOTE — Telephone Encounter (Signed)
Noted, updated Rx and sent refills to pharmacy.

## 2020-11-17 ENCOUNTER — Other Ambulatory Visit: Payer: Self-pay | Admitting: Internal Medicine

## 2020-11-19 ENCOUNTER — Telehealth: Payer: Self-pay

## 2020-11-19 NOTE — Patient Instructions (Addendum)
November 19, 2020  Dear [name],  It was a pleasure meeting you during our initial appointment on November 19, 2020. Below is a summary of the goals we discussed and components of chronic care management. Please contact me anytime with questions or concerns.   Visit Information  Goals Addressed            This Visit's Progress   . Pharmacy Care Plan       CARE PLAN ENTRY (see longitudinal plan of care for additional care plan information)  Current Barriers:  . Chronic Disease Management support, education, and care coordination needs related to Hyperlipidemia and Diabetes   Hyperlipidemia Lab Results  Component Value Date/Time   LDLCALC Comment 08/02/2016 09:47 AM   LDLDIRECT 78.0 05/03/2020 02:54 PM   . Pharmacist Clinical Goal(s): o Over the next 30 days, patient will work with PharmD and providers to maintain LDL goal < 100 . Current regimen:  o Atorvastatin 40 mg - 1 tablet daily . Interventions: o Reviewed recent labs - triglycerides elevated o Recommend diabetes control to improve TG . Patient self care activities - Over the next 30 days, patient will: o Continue medication as prescribed o Slowly increase exercise with goal of 30 minutes, 5 days per week o Incorporate a healthy diet high in vegetables, fruits and whole grains with low-fat dairy products, chicken, fish, legumes, non-tropical vegetable oils and nuts. Limit intake of sweets, sugar-sweetened beverages and red meats.   Diabetes Lab Results  Component Value Date/Time   HGBA1C 9.6 (A) 05/03/2020 02:47 PM   HGBA1C 9.4 (A) 12/27/2019 08:58 AM   HGBA1C 10.8 (H) 08/23/2019 08:57 AM   HGBA1C 9.9 (H) 05/21/2019 09:13 AM   . Pharmacist Clinical Goal(s): o Over the next 30 days, patient will work with PharmD and providers to achieve A1c goal <7% . Current regimen:   Metformin XR 500 mg  - 2 tablets daily  Jardiance 10 mg - 1 tablet daily  Glipizide 10 mg BID  Tresiba - Inject 25 units  daily  Freestyle Libre  . Interventions: o Recommend checking BG prior to breakfast, 2 hours after meals, bedtime, and with symptoms of hypoglycemia - refill sensors for Libre o Recommend increasing metformin o Recommend and discussed trial of Ozempic after trial of metformin increased dose . Patient self care activities - Over the next 30 days, patient will: o Check blood sugar 3-4 times daily, document, and provide at future appointments o Increase metformin to 2 tablets twice daily with meals pending PCP consult o Contact provider with any episodes of hypoglycemia  Initial goal documentation       Mr. Mcluckie was given information about Chronic Care Management services today including:  1. CCM service includes personalized support from designated clinical staff supervised by his physician, including individualized plan of care and coordination with other care providers 2. 24/7 contact phone numbers for assistance for urgent and routine care needs. 3. Standard insurance, coinsurance, copays and deductibles apply for chronic care management only during months in which we provide at least 20 minutes of these services. Most insurances cover these services at 100%, however patients may be responsible for any copay, coinsurance and/or deductible if applicable. This service may help you avoid the need for more expensive face-to-face services. 4. Only one practitioner may furnish and bill the service in a calendar month. 5. The patient may stop CCM services at any time (effective at the end of the month) by phone call to the office  staff.  Patient agreed to services and verbal consent obtained.   The patient verbalized understanding of instructions, educational materials, and care plan provided today and agreed to receive a mailed copy of patient instructions, educational materials, and care plan.  Telephone follow up appointment with pharmacy team member scheduled for: December 11, 2020 at 3:30  PM   Phil Dopp, PharmD Clinical Pharmacist [facility] at [facility] [phone]   Diabetes Mellitus and Standards of Medical Care Managing diabetes (diabetes mellitus) can be complicated. Your diabetes treatment may be managed by a team of health care providers, including:  A physician who specializes in diabetes (endocrinologist).  A nurse practitioner or physician assistant.  Nurses.  A diet and nutrition specialist (registered dietitian).  A certified diabetes educator (CDE).  An exercise specialist.  A pharmacist.  An eye doctor.  A foot specialist (podiatrist).  A dentist.  A primary care provider.  A mental health provider. Your health care providers follow guidelines to help you get the best quality of care. The following schedule is a general guideline for your diabetes management plan. Your health care providers may give you more specific instructions. Physical exams Upon being diagnosed with diabetes mellitus, and each year after that, your health care provider will ask about your medical and family history. He or she will also do a physical exam. Your exam may include:  Measuring your height, weight, and body mass index (BMI).  Checking your blood pressure. This will be done at every routine medical visit. Your target blood pressure may vary depending on your medical conditions, your age, and other factors.  Thyroid gland exam.  Skin exam.  Screening for damage to your nerves (peripheral neuropathy). This may include checking the pulse in your legs and feet and checking the level of sensation in your hands and feet.  A complete foot exam to inspect the structure and skin of your feet, including checking for cuts, bruises, redness, blisters, sores, or other problems.  Screening for blood vessel (vascular) problems, which may include checking the pulse in your legs and feet and checking your temperature. Blood tests Depending on your  treatment plan and your personal needs, you may have the following tests done:  HbA1c (hemoglobin A1c). This test provides information about blood sugar (glucose) control over the previous 2-3 months. It is used to adjust your treatment plan, if needed. This test will be done: ? At least 2 times a year, if you are meeting your treatment goals. ? 4 times a year, if you are not meeting your treatment goals or if treatment goals have changed.  Lipid testing, including total, LDL, and HDL cholesterol and triglyceride levels. ? The goal for LDL is less than 100 mg/dL (5.5 mmol/L). If you are at high risk for complications, the goal is less than 70 mg/dL (3.9 mmol/L). ? The goal for HDL is 40 mg/dL (2.2 mmol/L) or higher for men and 50 mg/dL (2.8 mmol/L) or higher for women. An HDL cholesterol of 60 mg/dL (3.3 mmol/L) or higher gives some protection against heart disease. ? The goal for triglycerides is less than 150 mg/dL (8.3 mmol/L).  Liver function tests.  Kidney function tests.  Thyroid function tests. Dental and eye exams  Visit your dentist two times a year.  If you have type 1 diabetes, your health care provider may recommend an eye exam 3-5 years after you are diagnosed, and then once a year after your first exam. ? For children with type 1  diabetes, a health care provider may recommend an eye exam when your child is age 54 or older and has had diabetes for 3-5 years. After the first exam, your child should get an eye exam once a year.  If you have type 2 diabetes, your health care provider may recommend an eye exam as soon as you are diagnosed, and then once a year after your first exam. Immunizations   The yearly flu (influenza) vaccine is recommended for everyone 6 months or older who has diabetes.  The pneumonia (pneumococcal) vaccine is recommended for everyone 2 years or older who has diabetes. If you are 32 or older, you may get the pneumonia vaccine as a series of two separate  shots.  The hepatitis B vaccine is recommended for adults shortly after being diagnosed with diabetes.  Adults and children with diabetes should receive all other vaccines according to age-specific recommendations from the Centers for Disease Control and Prevention (CDC). Mental and emotional health Screening for symptoms of eating disorders, anxiety, and depression is recommended at the time of diagnosis and afterward as needed. If your screening shows that you have symptoms (positive screening result), you may need more evaluation and you may work with a mental health care provider. Treatment plan Your treatment plan will be reviewed at every medical visit. You and your health care provider will discuss:  How you are taking your medicines, including insulin.  Any side effects you are experiencing.  Your blood glucose target goals.  The frequency of your blood glucose monitoring.  Lifestyle habits, such as activity level as well as tobacco, alcohol, and substance use. Diabetes self-management education Your health care provider will assess how well you are monitoring your blood glucose levels and whether you are taking your insulin correctly. He or she may refer you to:  A certified diabetes educator to manage your diabetes throughout your life, starting at diagnosis.  A registered dietitian who can create or review your personal nutrition plan.  An exercise specialist who can discuss your activity level and exercise plan. Summary  Managing diabetes (diabetes mellitus) can be complicated. Your diabetes treatment may be managed by a team of health care providers.  Your health care providers follow guidelines in order to help you get the best quality of care.  Standards of care including having regular physical exams, blood tests, blood pressure monitoring, immunizations, screening tests, and education about how to manage your diabetes.  Your health care providers may also give you  more specific instructions based on your individual health. This information is not intended to replace advice given to you by your health care provider. Make sure you discuss any questions you have with your health care provider. Document Revised: 08/07/2018 Document Reviewed: 08/16/2016 Elsevier Patient Education  2020 [employer].

## 2020-11-19 NOTE — Telephone Encounter (Signed)
Consult PCP regarding PCP 11/09/20:   Discussed diabetes management - patient would like to try increasing his metformin dose. Currently taking metformin ER 500 mg - 2 tablets daily with breakfast. Reports upset stomach with IR metformin only. We will follow up in 2 weeks and request updated prescription if patient tolerating well at that time. Renal function normal.  Thanks,  Phil Dopp, PharmD Clinical Pharmacist [facility] at [facility] [phone]

## 2020-11-20 NOTE — Telephone Encounter (Signed)
Noted, will await the update.  

## 2020-11-22 ENCOUNTER — Telehealth: Payer: Self-pay

## 2020-11-22 NOTE — Chronic Care Management (AMB) (Addendum)
Chronic Care Management Pharmacy Assistant   Name: Joseph [name]  MRN: [id number] DOB: 1970/12/24  Reason for Encounter: Medication Review   [name] was contacted on three different occasions to review how he doing on the higher dose metformin and to see if he had resumed using his Freestyle libre. Metformin was increased to 2 tablets twice daily with meals. Multiple messages were left for patient to return call.   PCP : Pleas Koch, NP  Allergies:   Allergies  Allergen Reactions   Azithromycin Hives   Clarithromycin    Erythromycin Hives   Keflex [Cephalexin] Nausea Only   Metformin Nausea And Vomiting   Symbicort [Budesonide-Formoterol Fumarate] Other (See Comments)    States that 3 doses were used and breathing became worse    Medications: Outpatient Encounter Medications as of 11/22/2020  Medication Sig   albuterol (PROVENTIL) (2.5 MG/3ML) 0.083% nebulizer solution USE 3 ML VIA NEBULIZER FOUR TIMES DAILY AS NEEDED FOR SHORTNESS OF BREATH   ANDROGEL PUMP 20.25 MG/ACT (1.62%) GEL Apply 2 Pump topically daily.    atorvastatin (LIPITOR) 40 MG tablet TAKE ONE TABLET (40MG TOTAL) BY MOUTH DAILY FOR CHOLESTEROL   benzonatate (TESSALON PERLES) 100 MG capsule Take 1 capsule (100 mg total) by mouth 3 (three) times daily as needed for cough.   blood glucose meter kit and supplies KIT Dispense based on patient and insurance preference. Use up to four times daily as directed. (FOR ICD-9 250.00, 250.01).   BREO ELLIPTA 200-25 MCG/INH AEPB INHALE 1 PUFF INTO THE LUNGS DAILY   buPROPion (WELLBUTRIN SR) 150 MG 12 hr tablet Take 1 tablet (150 mg total) by mouth 2 (two) times daily.   cetirizine (ZYRTEC) 10 MG tablet TAKE ONE TABLET (10MG TOTAL) BY MOUTH ATBEDTIME FOR ALLERGIES   citalopram (CELEXA) 40 MG tablet TAKE ONE (1) TABLET BY MOUTH EVERY DAY   cyclobenzaprine (FLEXERIL) 10 MG tablet Take 10 mg by mouth 2 (two) times daily as needed.   fluticasone (FLONASE) 50 MCG/ACT nasal  spray Place 2 sprays into both nostrils daily.   gabapentin (NEURONTIN) 300 MG capsule TAKE 2 CAPSULES BY MOUTH TWICE A DAY.   glipiZIDE (GLUCOTROL) 10 MG tablet TAKE ONE TABLET TWICE DAILY FOR DIABETES   insulin degludec (TRESIBA) 100 UNIT/ML FlexTouch Pen Inject 25 Units into the skin daily.   Insulin Pen Needle (PEN NEEDLES) 31G X 6 MM MISC Use nightly with insulin.   Ipratropium-Albuterol (COMBIVENT) 20-100 MCG/ACT AERS respimat Inhale 1 puff into the lungs every 4 (four) hours as needed for wheezing.   JARDIANCE 10 MG TABS tablet TAKE ONE TABLET (10MG) BY MOUTH EVERY MORNING FOR DIABETES   Lancets (ONETOUCH DELICA PLUS [id number]) MISC USE TO CHECK BLOOD SUGAR UP TO FOUR TIMES DAILY   lansoprazole (PREVACID) 30 MG capsule Take 1 capsule (30 mg total) by mouth 2 (two) times daily before a meal.   metFORMIN (GLUCOPHAGE-XR) 500 MG 24 hr tablet Take 2 tablets (1,000 mg total) by mouth daily with breakfast. For diabetes.   ONETOUCH VERIO test strip USE TO CHECK BLOOD SUGAR UP TO FOUR TIMES DAILY   tamsulosin (FLOMAX) 0.4 MG CAPS capsule TAKE ONE CAPSULE BY MOUTH DAILY   tapentadol HCl (NUCYNTA) 75 MG tablet Take 75 mg by mouth 2 (two) times daily.   Turmeric 500 MG CAPS Take 1 capsule by mouth daily.   zolpidem (AMBIEN) 10 MG tablet TAKE ONE TABLET (10MG TOTAL) BY MOUTH ATBEDTIME AS NEEDED FOR SLEEP   No facility-administered  encounter medications on file as of 11/22/2020.    Current Diagnosis: Patient Active Problem List   Diagnosis Date Noted   Preventative health care 07/05/2020   Leukocytosis 05/04/2020   Chronic fatigue 05/03/2020   Medicare annual wellness visit, subsequent 06/28/2019   Controlled substance agreement broken 09/14/2018   Primary osteoarthritis of knee 07/21/2018   Status post unicompartmental knee replacement, left 07/21/2018   Medicare annual wellness visit, initial 05/20/2018   Effusion of knee joint (Left) 04/21/2018   Opiate overdose ([place]) 03/24/2018    Arthropathy of knee (Left) 01/12/2018   Opioid-induced constipation ([facility]) 01/12/2018   Tinea corporis 01/07/2018   Tinea versicolor 01/07/2018   Pharmacologic therapy 12/08/2017   Problems influencing health status 12/08/2017   Long term prescription opiate use 12/08/2017   Opiate use 12/08/2017   Insomnia 11/20/2017   Disorder of skeletal system 11/05/2017   Chronic knee pain (Primary Area of Pain) (Bilateral) (L>R) 11/05/2017   Chronic wrist pain (Secondary Area of Pain) (Left) 11/05/2017   Chronic constipation 08/18/2017   Hyperlipidemia 05/12/2017   COPD without exacerbation ([place]) 05/07/2017   Male hypogonadism 04/24/2017   Post-void dribbling 04/01/2017   Osteoarthritis of the knee (Left)    Skin lesions 03/12/2017   GERD (gastroesophageal reflux disease) 03/12/2017   Bucket handle tear of meniscus of knee (Right)    Anxiety and depression 10/07/2015   Morbid obesity ([place]) 10/06/2015   OSA (obstructive sleep apnea) 08/31/2014   Testosterone deficiency 07/01/2014   S/P knee surgery 04/26/2014   Meniscal tear of knee, sequela (Left) 04/26/2014   Medial meniscus, posterior horn derangement 04/22/2014   Intrinsic asthma 12/19/2013   Chronic pain syndrome 05/13/2013   Diabetes ([place]) 05/13/2013   Mediastinal abnormality 04/10/2012   Cigarette smoker 04/10/2012     Follow-Up:  Pharmacist Review   Debbora Dus, CPP notified  Margaretmary Dys, [place] Assistant [phone]  I have reviewed the care management and care coordination activities outlined in this encounter. CCM follow up visit is scheduled on 12/11/2020 to review.  Debbora Dus, PharmD Clinical Pharmacist [facility] at [facility] [phone]

## 2020-11-27 ENCOUNTER — Telehealth (INDEPENDENT_AMBULATORY_CARE_PROVIDER_SITE_OTHER): Payer: Medicare HMO | Admitting: Family Medicine

## 2020-11-27 ENCOUNTER — Encounter: Payer: Self-pay | Admitting: Family Medicine

## 2020-11-27 ENCOUNTER — Other Ambulatory Visit: Payer: Self-pay | Admitting: Primary Care

## 2020-11-27 ENCOUNTER — Other Ambulatory Visit: Payer: Self-pay

## 2020-11-27 VITALS — Ht 70.0 in | Wt 255.0 lb

## 2020-11-27 DIAGNOSIS — G8929 Other chronic pain: Secondary | ICD-10-CM

## 2020-11-27 DIAGNOSIS — F17210 Nicotine dependence, cigarettes, uncomplicated: Secondary | ICD-10-CM | POA: Diagnosis not present

## 2020-11-27 DIAGNOSIS — E118 Type 2 diabetes mellitus with unspecified complications: Secondary | ICD-10-CM

## 2020-11-27 DIAGNOSIS — E1165 Type 2 diabetes mellitus with hyperglycemia: Secondary | ICD-10-CM

## 2020-11-27 DIAGNOSIS — I889 Nonspecific lymphadenitis, unspecified: Secondary | ICD-10-CM | POA: Insufficient documentation

## 2020-11-27 DIAGNOSIS — G894 Chronic pain syndrome: Secondary | ICD-10-CM

## 2020-11-27 DIAGNOSIS — IMO0002 Reserved for concepts with insufficient information to code with codable children: Secondary | ICD-10-CM

## 2020-11-27 MED ORDER — AMOXICILLIN-POT CLAVULANATE 875-125 MG PO TABS: 1.0000 | ORAL_TABLET | Freq: Two times a day (BID) | ORAL | 0 refills | Status: AC

## 2020-11-27 NOTE — Assessment & Plan Note (Signed)
Of 1d duration, anticipate infectious/inflammatory given short duration and associated discomfort. In poorly controlled diabetic and ongoing smoker, at increased risk of infection - treat with augmentin 1 wk course. Discussed warm compress use.  Update if not improved with this to come in for OV.  Pt agrees with plan.

## 2020-11-27 NOTE — Assessment & Plan Note (Signed)
Continue to encourage full cessation - he is looking into hypnotism.

## 2020-11-27 NOTE — Progress Notes (Signed)
Patient ID: Joseph [name], male    DOB: [birth year], 49 y.o.   MRN: [id number]  Virtual visit completed through [place], a video enabled telemedicine application. Due to national recommendations of social distancing due to COVID-19, a virtual visit is felt to be most appropriate for this patient at this time. Reviewed limitations, risks, security and privacy concerns of performing a virtual visit and the availability of in person appointments. I also reviewed that there may be a patient responsible charge related to this service. The patient agreed to proceed.   Patient location: home Provider location: [place] at [facility], office Persons participating in this virtual visit: patient, provider   If any vitals were documented, they were collected by patient at home unless specified below.    Ht 5' 10" (1.778 m)   Wt 255 lb (115.7 kg)   BMI 36.59 kg/m    CC: R lymph node swelling Subjective:   HPI: Joseph [name] is a 49 y.o. male presenting on 11/27/2020 for Edema and Neck Pain (C/o pain and swelling in right lymph node.  He can feel a firm area.  Started yesterday. )   1d h/o swollen right neck lymph node at tonsillar area. Feels sharp pain at this lymph node. Mild ST, cough, sinus congestion and PNdrainage (chronic) - manages with zyrtec. Has had some teeth removed (10/2020) - but not having any dental pain currently.   No fevers/chills. No dental pain.  Doesn't note swollen gland under arms, above clavicles, or in inguinal region.  Doesn't have thermometer.  No recent cat scratches.   Uses full face CPAP mask at night - this is aggravating painful lymph node.   Treated last month for sinusitis with augmentin course.  Tested negative for COVID at that time.  Has not had COVID vaccine.  No known COVID exposure.   Current 1 ppd smoker. Looking into hypnotherapy to help him quit.  Diabetic on metformin XR, glipizide, tresiba 25u daily, and jardiance 64m daily. Sugars have been  recently running elevated. Uses CGM (cbg's 180-250).  Lab Results  Component Value Date   HGBA1C 9.6 (A) 05/03/2020   No alcohol intake.      Relevant past medical, surgical, family and social history reviewed and updated as indicated. Interim medical history since our last visit reviewed. Allergies and medications reviewed and updated. Outpatient Medications Prior to Visit  Medication Sig Dispense Refill  . albuterol (PROVENTIL) (2.5 MG/3ML) 0.083% nebulizer solution USE 3 ML VIA NEBULIZER FOUR TIMES DAILY AS NEEDED FOR SHORTNESS OF BREATH 360 mL 5  . ANDROGEL PUMP 20.25 MG/ACT (1.62%) GEL Apply 2 Pump topically daily.     .Marland Kitchenatorvastatin (LIPITOR) 40 MG tablet TAKE ONE TABLET (40MG TOTAL) BY MOUTH DAILY FOR CHOLESTEROL 90 tablet 3  . benzonatate (TESSALON PERLES) 100 MG capsule Take 1 capsule (100 mg total) by mouth 3 (three) times daily as needed for cough. 30 capsule 0  . blood glucose meter kit and supplies KIT Dispense based on patient and insurance preference. Use up to four times daily as directed. (FOR ICD-9 250.00, 250.01). 1 each 0  . BREO ELLIPTA 200-25 MCG/INH AEPB INHALE 1 PUFF INTO THE LUNGS DAILY 60 each 5  . buPROPion (WELLBUTRIN SR) 150 MG 12 hr tablet Take 1 tablet (150 mg total) by mouth 2 (two) times daily. 180 tablet 3  . cetirizine (ZYRTEC) 10 MG tablet TAKE ONE TABLET (10MG TOTAL) BY MOUTH ATBEDTIME FOR ALLERGIES 90 tablet 1  . citalopram (CELEXA)  40 MG tablet TAKE ONE (1) TABLET BY MOUTH EVERY DAY 90 tablet 0  . cyclobenzaprine (FLEXERIL) 10 MG tablet Take 10 mg by mouth 2 (two) times daily as needed.    . fluticasone (FLONASE) 50 MCG/ACT nasal spray Place 2 sprays into both nostrils daily. 16 g 0  . gabapentin (NEURONTIN) 300 MG capsule TAKE 2 CAPSULES BY MOUTH TWICE A DAY. 360 capsule 1  . glipiZIDE (GLUCOTROL) 10 MG tablet TAKE ONE TABLET TWICE DAILY FOR DIABETES 180 tablet 3  . insulin degludec (TRESIBA) 100 UNIT/ML FlexTouch Pen Inject 25 Units into the skin  daily. 15 mL 5  . Insulin Pen Needle (PEN NEEDLES) 31G X 6 MM MISC Use nightly with insulin. 100 each 5  . Ipratropium-Albuterol (COMBIVENT) 20-100 MCG/ACT AERS respimat Inhale 1 puff into the lungs every 4 (four) hours as needed for wheezing. 4 g 11  . JARDIANCE 10 MG TABS tablet TAKE ONE TABLET (10MG) BY MOUTH EVERY MORNING FOR DIABETES 90 tablet 1  . Lancets (ONETOUCH DELICA PLUS LANCET33G) MISC USE TO CHECK BLOOD SUGAR UP TO FOUR TIMES DAILY 100 each 5  . lansoprazole (PREVACID) 30 MG capsule Take 1 capsule (30 mg total) by mouth 2 (two) times daily before a meal. 180 capsule 1  . metFORMIN (GLUCOPHAGE-XR) 500 MG 24 hr tablet Take 2 tablets (1,000 mg total) by mouth daily with breakfast. For diabetes. 180 tablet 3  . ONETOUCH VERIO test strip USE TO CHECK BLOOD SUGAR UP TO FOUR TIMES DAILY 100 strip 5  . tamsulosin (FLOMAX) 0.4 MG CAPS capsule TAKE ONE CAPSULE BY MOUTH DAILY 90 capsule 1  . tapentadol HCl (NUCYNTA) 75 MG tablet Take 75 mg by mouth 2 (two) times daily.    . Turmeric 500 MG CAPS Take 1 capsule by mouth daily.    . zolpidem (AMBIEN) 10 MG tablet TAKE ONE TABLET (10MG TOTAL) BY MOUTH ATBEDTIME AS NEEDED FOR SLEEP 90 tablet 0   No facility-administered medications prior to visit.     Per HPI unless specifically indicated in ROS section below Review of Systems Objective:  Ht 5' 10" (1.778 m)   Wt 255 lb (115.7 kg)   BMI 36.59 kg/m   Wt Readings from Last 3 Encounters:  11/27/20 255 lb (115.7 kg)  08/18/20 252 lb (114.3 kg)  07/05/20 254 lb 4 oz (115.3 kg)       Physical exam: Gen: alert, NAD, not ill appearing Pulm: speaks in complete sentences without increased work of breathing Psych: normal mood, normal thought content      Lab Results  Component Value Date   HGBA1C 9.6 (A) 05/03/2020    Assessment & Plan:   Problem List Items Addressed This Visit    Diabetes mellitus type 2, uncontrolled, with complications (HCC)    Remains uncontrolled as evidenced by  latest A1c. Metformin dose recently increased, he is looking into affordability of weekly GLP1 RA. rec f/u with PCP shortly.      Cigarette smoker    Continue to encourage full cessation - he is looking into hypnotism.      Cervical lymphadenitis - Primary    Of 1d duration, anticipate infectious/inflammatory given short duration and associated discomfort. In poorly controlled diabetic and ongoing smoker, at increased risk of infection - treat with augmentin 1 wk course. Discussed warm compress use.  Update if not improved with this to come in for OV.  Pt agrees with plan.           Meds   ordered this encounter  Medications  . amoxicillin-clavulanate (AUGMENTIN) 875-125 MG tablet    Sig: Take 1 tablet by mouth 2 (two) times daily for 10 days.    Dispense:  14 tablet    Refill:  0   No orders of the defined types were placed in this encounter.   I discussed the assessment and treatment plan with the patient. The patient was provided an opportunity to ask questions and all were answered. The patient agreed with the plan and demonstrated an understanding of the instructions. The patient was advised to call back or seek an in-person evaluation if the symptoms worsen or if the condition fails to improve as anticipated.  Follow up plan: Return if symptoms worsen or fail to improve.  Javier Gutierrez, MD   

## 2020-11-27 NOTE — Telephone Encounter (Signed)
Please Advise

## 2020-11-27 NOTE — Assessment & Plan Note (Addendum)
Remains uncontrolled as evidenced by latest A1c. Metformin dose recently increased, he is looking into affordability of weekly GLP1 RA. rec f/u with PCP shortly.

## 2020-12-05 ENCOUNTER — Telehealth: Payer: Self-pay

## 2020-12-05 NOTE — Chronic Care Management (AMB) (Addendum)
Chronic Care Management Pharmacy Assistant   Name: Joseph [name]  MRN: [id number] DOB: 14-May-1971  Reason for Encounter: Disease State Review   Patient Questions:  1.  Have you seen any other providers since your last visit? Yes 11/27/20 Video visit Dr. Ria Bush - PCP  2.  Any changes in your medicines or health? Yes 11/27/20 Dr. Danise Mina started short course of Augmentin.     PCP : Pleas Koch, NP  Allergies:   Allergies  Allergen Reactions   Azithromycin Hives   Clarithromycin    Erythromycin Hives   Keflex [Cephalexin] Nausea Only   Metformin Nausea And Vomiting   Symbicort [Budesonide-Formoterol Fumarate] Other (See Comments)    States that 3 doses were used and breathing became worse    Medications: Outpatient Encounter Medications as of 12/05/2020  Medication Sig   albuterol (PROVENTIL) (2.5 MG/3ML) 0.083% nebulizer solution USE 3 ML VIA NEBULIZER FOUR TIMES DAILY AS NEEDED FOR SHORTNESS OF BREATH   amoxicillin-clavulanate (AUGMENTIN) 875-125 MG tablet Take 1 tablet by mouth 2 (two) times daily for 10 days.   ANDROGEL PUMP 20.25 MG/ACT (1.62%) GEL Apply 2 Pump topically daily.    atorvastatin (LIPITOR) 40 MG tablet TAKE ONE TABLET (40MG TOTAL) BY MOUTH DAILY FOR CHOLESTEROL   benzonatate (TESSALON PERLES) 100 MG capsule Take 1 capsule (100 mg total) by mouth 3 (three) times daily as needed for cough.   blood glucose meter kit and supplies KIT Dispense based on patient and insurance preference. Use up to four times daily as directed. (FOR ICD-9 250.00, 250.01).   BREO ELLIPTA 200-25 MCG/INH AEPB INHALE 1 PUFF INTO THE LUNGS DAILY   buPROPion (WELLBUTRIN SR) 150 MG 12 hr tablet Take 1 tablet (150 mg total) by mouth 2 (two) times daily.   cetirizine (ZYRTEC) 10 MG tablet TAKE ONE TABLET (10MG TOTAL) BY MOUTH ATBEDTIME FOR ALLERGIES   citalopram (CELEXA) 40 MG tablet TAKE ONE (1) TABLET BY MOUTH EVERY DAY   cyclobenzaprine (FLEXERIL) 10 MG tablet Take 10  mg by mouth 2 (two) times daily as needed.   fluticasone (FLONASE) 50 MCG/ACT nasal spray Place 2 sprays into both nostrils daily.   gabapentin (NEURONTIN) 300 MG capsule TAKE 2 CAPSULES BY MOUTH TWICE A DAY.   glipiZIDE (GLUCOTROL) 10 MG tablet TAKE ONE TABLET TWICE DAILY FOR DIABETES   insulin degludec (TRESIBA) 100 UNIT/ML FlexTouch Pen Inject 25 Units into the skin daily.   Insulin Pen Needle (PEN NEEDLES) 31G X 6 MM MISC Use nightly with insulin.   Ipratropium-Albuterol (COMBIVENT) 20-100 MCG/ACT AERS respimat Inhale 1 puff into the lungs every 4 (four) hours as needed for wheezing.   JARDIANCE 10 MG TABS tablet TAKE ONE TABLET (10MG) BY MOUTH EVERY MORNING FOR DIABETES   Lancets (ONETOUCH DELICA PLUS [id number]) MISC USE TO CHECK BLOOD SUGAR UP TO FOUR TIMES DAILY   lansoprazole (PREVACID) 30 MG capsule Take 1 capsule (30 mg total) by mouth 2 (two) times daily before a meal.   metFORMIN (GLUCOPHAGE-XR) 500 MG 24 hr tablet Take 2 tablets (1,000 mg total) by mouth daily with breakfast. For diabetes.   ONETOUCH VERIO test strip USE TO CHECK BLOOD SUGAR UP TO FOUR TIMES DAILY   tamsulosin (FLOMAX) 0.4 MG CAPS capsule TAKE ONE CAPSULE BY MOUTH DAILY   tapentadol HCl (NUCYNTA) 75 MG tablet Take 75 mg by mouth 2 (two) times daily.   Turmeric 500 MG CAPS Take 1 capsule by mouth daily.   zolpidem (AMBIEN) 10 MG  tablet TAKE ONE TABLET (10MG TOTAL) BY MOUTH ATBEDTIME AS NEEDED FOR SLEEP   No facility-administered encounter medications on file as of 12/05/2020.    Current Diagnosis: Patient Active Problem List   Diagnosis Date Noted   Cervical lymphadenitis 11/27/2020   Preventative health care 07/05/2020   Leukocytosis 05/04/2020   Chronic fatigue 05/03/2020   Medicare annual wellness visit, subsequent 06/28/2019   Controlled substance agreement broken 09/14/2018   Primary osteoarthritis of knee 07/21/2018   Status post unicompartmental knee replacement, left 07/21/2018   Medicare annual  wellness visit, initial 05/20/2018   Effusion of knee joint (Left) 04/21/2018   Opiate overdose ([place]) 03/24/2018   Arthropathy of knee (Left) 01/12/2018   Opioid-induced constipation (OIC) 01/12/2018   Tinea corporis 01/07/2018   Tinea versicolor 01/07/2018   Pharmacologic therapy 12/08/2017   Problems influencing health status 12/08/2017   Long term prescription opiate use 12/08/2017   Opiate use 12/08/2017   Insomnia 11/20/2017   Disorder of skeletal system 11/05/2017   Chronic knee pain (Primary Area of Pain) (Bilateral) (L>R) 11/05/2017   Chronic wrist pain (Secondary Area of Pain) (Left) 11/05/2017   Chronic constipation 08/18/2017   Hyperlipidemia 05/12/2017   COPD without exacerbation ([place]) 05/07/2017   Male hypogonadism 04/24/2017   Post-void dribbling 04/01/2017   Osteoarthritis of the knee (Left)    Skin lesions 03/12/2017   GERD (gastroesophageal reflux disease) 03/12/2017   Bucket handle tear of meniscus of knee (Right)    Anxiety and depression 10/07/2015   Morbid obesity ([place]) 10/06/2015   OSA (obstructive sleep apnea) 08/31/2014   Testosterone deficiency 07/01/2014   S/P knee surgery 04/26/2014   Meniscal tear of knee, sequela (Left) 04/26/2014   Medial meniscus, posterior horn derangement 04/22/2014   Intrinsic asthma 12/19/2013   Chronic pain syndrome 05/13/2013   Diabetes mellitus type 2, uncontrolled, with complications ([place]) 95/63/8756   Mediastinal abnormality 04/10/2012   Cigarette smoker 04/10/2012   Patient contacted to review BG readings and see how patient is doing on metformin dose increase. Yesterday 12/04/20 at 8:30 AM BG was 163 and at 6:55 PM it was 120. He notes that he is taking 2 tablets of metformin in the morning and evening. He is tolerating this well and feels like this is helping lower his blood glucose levels. He is still interested in trying Trulicity or similar medication.   Follow-Up:  Pharmacist Review   Debbora Dus, CPP  notified  Margaretmary Dys, [place] Assistant [phone]  I have reviewed the care management and care coordination activities outlined in this encounter and I am certifying that I agree with the content of this note. No further action required. CCM follow up in 1 week.   Debbora Dus, PharmD Clinical Pharmacist [facility] at [facility] [phone]

## 2020-12-11 ENCOUNTER — Telehealth: Payer: Self-pay

## 2020-12-11 ENCOUNTER — Ambulatory Visit: Payer: Medicare HMO

## 2020-12-11 DIAGNOSIS — E119 Type 2 diabetes mellitus without complications: Secondary | ICD-10-CM

## 2020-12-11 MED ORDER — OZEMPIC (0.25 OR 0.5 MG/DOSE) 2 MG/1.5ML ~~LOC~~ SOPN: 0.2500 mg | PEN_INJECTOR | SUBCUTANEOUS | 0 refills | Status: DC

## 2020-12-11 MED ORDER — METFORMIN HCL ER 500 MG PO TB24: 1000.0000 mg | ORAL_TABLET | Freq: Two times a day (BID) | ORAL | 3 refills | Status: DC

## 2020-12-11 MED ORDER — INSULIN DEGLUDEC 100 UNIT/ML ~~LOC~~ SOPN: 20.0000 [IU] | PEN_INJECTOR | Freq: Every day | SUBCUTANEOUS | 2 refills | Status: DC

## 2020-12-11 NOTE — Telephone Encounter (Signed)
Noted and appreciate recommendations.  Metformin ER 500 mg prescription changed to 2 tablets twice daily with meals.  New prescription sent to pharmacy. New prescription for Ozempic 0.25 mg weekly sent to pharmacy.  I adjusted his Evaristo Bury to 20 units, but did not send a new prescription.  I will await your update.

## 2020-12-11 NOTE — Telephone Encounter (Signed)
PCP consult regarding CCM 12/11/20:  Diabetes review - Pt increased metformin 500 mg ER from 2 tablets daily to 2 tablets BID for the past 3 weeks and reports BG have been more stable with less highs in the evening. Tolerating well. He will need a new prescription with current directions to continue this. He reports lowest reading on Libre 109, highest 223 in past 14 days checking BID. Continues Tresiba 25 units daily, glipizide 10 mg BID, and Jardiance 10 mg daily. He is very interested in trying GLP-1 again with goal of coming off insulin. Per chart he had gas on Trulicity but pt denies recall of this. Recommend starting Ozempic 0.25 mg and reducing his insulin to 20 units (20% reduction) out of abundance of caution. If you agree, please send Ozempic 0.25 mg weekly and metformin 500 mg XR - 2 tabs BID to his pharmacy. I will coordinate follow up in 2 weeks to review tolerance.  Lab Results  Component Value Date/Time   HGBA1C 9.6 (A) 05/03/2020 02:47 PM   HGBA1C 9.4 (A) 12/27/2019 08:58 AM   HGBA1C 10.8 (H) 08/23/2019 08:57 AM   HGBA1C 9.9 (H) 05/21/2019 09:13 AM   Lab Results  Component Value Date   CREATININE 0.94 05/03/2020   BUN 6 05/03/2020   GFR 84.95 05/03/2020   GFRNONAA >60 07/13/2018   GFRAA >60 07/13/2018   NA 136 05/03/2020   K 3.3 (L) 05/03/2020   CALCIUM 9.4 05/03/2020   CO2 27 05/03/2020   Phil Dopp, PharmD Clinical Pharmacist [facility] at [facility] [phone]

## 2020-12-11 NOTE — Chronic Care Management (AMB) (Signed)
Chronic Care Management Pharmacy  Name: COE ANGELOS  MRN: [id number] DOB: 1971/07/12  Chief Complaint/ HPI  Arn Medal,  50 y.o. , male presents for their Follow-Up CCM visit with the clinical pharmacist via telephone. Patient was in a hurry today. Focused on uncontrolled diabetes.  PCP : Pleas Koch, NP  Their chronic conditions include: OSA, asthma, COPD, chronic constipation, GERD, diabetes, OA, HLD, insomnia   Last CCM 11/09/2020 - increased metformin XR to 500 mg 2 tablets BID  Office Visits:  11/07/20: Pt message requesting refill Prevacid 30 mg BID before meals  10/05/20: Pt message requesting insulin refill  07/05/20: PCP visit - rx for chantix sent last visit, pt has not filled, continue current medications  06/21/20: PCP visit diabetes - increase insulin to 20 units  Consult Visit:  09/22/20: Urology - male hypogonadism, taking Androgel and Flomax, ED cialis PRN, denies LUTS, meds refilled, follow up in 1 year  08/18/20: Pulmonary - active smoker, tried Chantix (dreams), not vaccinated, breathing unchanged since last visit, Combivent 3-4x/week, cough improving, problems with CPAP, strongly recommend COVID vaccine, continue current medications  Allergies  Allergen Reactions  . Azithromycin Hives  . Clarithromycin   . Erythromycin Hives  . Keflex [Cephalexin] Nausea Only  . Metformin Nausea And Vomiting  . Symbicort [Budesonide-Formoterol Fumarate] Other (See Comments)    States that 3 doses were used and breathing became worse   Medications: Outpatient Encounter Medications as of 12/11/2020  Medication Sig  . albuterol (PROVENTIL) (2.5 MG/3ML) 0.083% nebulizer solution USE 3 ML VIA NEBULIZER FOUR TIMES DAILY AS NEEDED FOR SHORTNESS OF BREATH  . ANDROGEL PUMP 20.25 MG/ACT (1.62%) GEL Apply 2 Pump topically daily.   Marland Kitchen atorvastatin (LIPITOR) 40 MG tablet TAKE ONE TABLET (40MG TOTAL) BY MOUTH DAILY FOR CHOLESTEROL  . benzonatate (TESSALON PERLES) 100 MG  capsule Take 1 capsule (100 mg total) by mouth 3 (three) times daily as needed for cough.  . blood glucose meter kit and supplies KIT Dispense based on patient and insurance preference. Use up to four times daily as directed. (FOR ICD-9 250.00, 250.01).  Marland Kitchen BREO ELLIPTA 200-25 MCG/INH AEPB INHALE 1 PUFF INTO THE LUNGS DAILY  . buPROPion (WELLBUTRIN SR) 150 MG 12 hr tablet Take 1 tablet (150 mg total) by mouth 2 (two) times daily.  . cetirizine (ZYRTEC) 10 MG tablet TAKE ONE TABLET (10MG TOTAL) BY MOUTH ATBEDTIME FOR ALLERGIES  . citalopram (CELEXA) 40 MG tablet TAKE ONE (1) TABLET BY MOUTH EVERY DAY  . cyclobenzaprine (FLEXERIL) 10 MG tablet Take 10 mg by mouth 2 (two) times daily as needed.  . fluticasone (FLONASE) 50 MCG/ACT nasal spray Place 2 sprays into both nostrils daily.  Marland Kitchen gabapentin (NEURONTIN) 300 MG capsule TAKE 2 CAPSULES BY MOUTH TWICE A DAY.  Marland Kitchen glipiZIDE (GLUCOTROL) 10 MG tablet TAKE ONE TABLET TWICE DAILY FOR DIABETES  . insulin degludec (TRESIBA) 100 UNIT/ML FlexTouch Pen Inject 25 Units into the skin daily.  . Insulin Pen Needle (PEN NEEDLES) 31G X 6 MM MISC Use nightly with insulin.  . Ipratropium-Albuterol (COMBIVENT) 20-100 MCG/ACT AERS respimat Inhale 1 puff into the lungs every 4 (four) hours as needed for wheezing.  Marland Kitchen JARDIANCE 10 MG TABS tablet TAKE ONE TABLET (10MG) BY MOUTH EVERY MORNING FOR DIABETES  . Lancets (ONETOUCH DELICA PLUS [id number]) MISC USE TO CHECK BLOOD SUGAR UP TO FOUR TIMES DAILY  . lansoprazole (PREVACID) 30 MG capsule Take 1 capsule (30 mg total) by mouth 2 (two) times daily  before a meal.  . metFORMIN (GLUCOPHAGE-XR) 500 MG 24 hr tablet Take 2 tablets (1,000 mg total) by mouth daily with breakfast. For diabetes.  Glory Rosebush VERIO test strip USE TO CHECK BLOOD SUGAR UP TO FOUR TIMES DAILY  . tamsulosin (FLOMAX) 0.4 MG CAPS capsule TAKE ONE CAPSULE BY MOUTH DAILY  . tapentadol HCl (NUCYNTA) 75 MG tablet Take 75 mg by mouth 2 (two) times daily.  .  Turmeric 500 MG CAPS Take 1 capsule by mouth daily.  Marland Kitchen zolpidem (AMBIEN) 10 MG tablet TAKE ONE TABLET (10MG TOTAL) BY MOUTH ATBEDTIME AS NEEDED FOR SLEEP   No facility-administered encounter medications on file as of 12/11/2020.    Current Diagnosis/Assessment:   Goals Addressed            This Visit's Progress   . Pharmacy Care Plan       CARE PLAN ENTRY (see longitudinal plan of care for additional care plan information)  Current Barriers:  . Chronic Disease Management support, education, and care coordination needs related to Hyperlipidemia and Diabetes   Hyperlipidemia Lab Results  Component Value Date/Time   LDLCALC Comment 08/02/2016 09:47 AM   LDLDIRECT 78.0 05/03/2020 02:54 PM   . Pharmacist Clinical Goal(s): o Over the next 30 days, patient will work with PharmD and providers to maintain LDL goal < 100 . Current regimen:  o Atorvastatin 40 mg - 1 tablet daily . Interventions: o Reviewed recent labs - triglycerides elevated o Recommend diabetes control to improve TG . Patient self care activities - Over the next 30 days, patient will: o Continue medication as prescribed o Slowly increase exercise with goal of 30 minutes, 5 days per week o Incorporate a healthy diet high in vegetables, fruits and whole grains with low-fat dairy products, chicken, fish, legumes, non-tropical vegetable oils and nuts. Limit intake of sweets, sugar-sweetened beverages and red meats.   Diabetes Lab Results  Component Value Date/Time   HGBA1C 9.6 (A) 05/03/2020 02:47 PM   HGBA1C 9.4 (A) 12/27/2019 08:58 AM   HGBA1C 10.8 (H) 08/23/2019 08:57 AM   HGBA1C 9.9 (H) 05/21/2019 09:13 AM   . Pharmacist Clinical Goal(s): o Over the next 30 days, patient will work with PharmD and providers to achieve A1c goal <7% . Current regimen:   Metformin XR 500 mg  - 2 tablets twice daily  Jardiance 10 mg - 1 tablet daily  Glipizide 10 mg BID  Tresiba - Inject 25 units daily  Freestyle Libre  1 . Interventions: o Recommend checking BG prior to breakfast, 2 hours after meals, bedtime, and with symptoms of hypoglycemia - refill sensors for Libre o Continue increased dose of metformin - 2 tablets twice daily o Recommend re-trial of GLP-1 --> Ozempic  . Patient self care activities - Over the next 30 days, patient will: o Check blood sugar 3-4 times daily, document, and provide at future appointments o Continue metformin 2 tablets twice daily with meals  o Continue other medications same until further contact from pharmacist o Contact provider with any episodes of hypoglycemia  Please see past updates related to this goal by clicking on the "Past Updates" button in the selected goal       Diabetes   Allergies  Allergen Reactions  . Azithromycin Hives  . Clarithromycin   . Erythromycin Hives  . Keflex [Cephalexin] Nausea Only  . Metformin Nausea And Vomiting  . Symbicort [Budesonide-Formoterol Fumarate] Other (See Comments)    States that 3 doses were used and breathing  became worse   Lab Results  Component Value Date   CREATININE 0.94 05/03/2020   BUN 6 05/03/2020   GFR 84.95 05/03/2020   GFRNONAA >60 07/13/2018   GFRAA >60 07/13/2018   NA 136 05/03/2020   K 3.3 (L) 05/03/2020   CALCIUM 9.4 05/03/2020   CO2 27 05/03/2020   Recent Relevant Labs: Lab Results  Component Value Date/Time   HGBA1C 9.6 (A) 05/03/2020 02:47 PM   HGBA1C 9.4 (A) 12/27/2019 08:58 AM   HGBA1C 10.8 (H) 08/23/2019 08:57 AM   HGBA1C 9.9 (H) 05/21/2019 09:13 AM   MICROALBUR <0.7 05/03/2020 02:49 PM   MICROALBUR <0.7 05/21/2019 09:13 AM   Patient has failed these meds in past: reports when he was on IR metformin, has some stomach upset  Patient is currently uncontrolled on the following medications:   Metformin XR 500 mg  - 2 tablets BID  Jardiance 10 mg - 1 tablet daily  Glipizide 10 mg BID  Tresiba - Inject 25 units daily  Update 12/11/20: Pt reports tolerating metformin XR 500 mg  2 tablets BID well and has noticed his BG has been more stable. Pt denies any signs or symptoms of hypoglycemia. Reports BG from last 14 days highest 223, lowest 109. Checking twice daily with Freestyle. Confirms taking meds as prescribed including Tresiba 25 units daily. Pt is very interested in trying Ozempic. He is aware per chart he did not tolerate Trulicity in the past due to gas. Pt would still like to try Ozempic. Discussed benefits/risks and common GI side effects which usually improve with time.  Plan: Continue current medications; Consult PCP for metformin prescription update to 2000 mg daily and to consider Ozempic trial. Recommend Reducing Tresiba to 20 units on the day he starts Ozempic.   Follow up: CCM follow up 1 month   Debbora Dus, PharmD Clinical Pharmacist [facility] at [facility] [phone]

## 2020-12-11 NOTE — Patient Instructions (Signed)
Dear Joseph [name],  Below is a summary of the goals we discussed during our follow up appointment on December 11, 2020. Please contact me anytime with questions or concerns.   Visit Information  Goals Addressed            This Visit's Progress   . Pharmacy Care Plan       CARE PLAN ENTRY (see longitudinal plan of care for additional care plan information)  Current Barriers:  . Chronic Disease Management support, education, and care coordination needs related to Hyperlipidemia and Diabetes   Hyperlipidemia Lab Results  Component Value Date/Time   LDLCALC Comment 08/02/2016 09:47 AM   LDLDIRECT 78.0 05/03/2020 02:54 PM   . Pharmacist Clinical Goal(s): o Over the next 30 days, patient will work with PharmD and providers to maintain LDL goal < 100 . Current regimen:  o Atorvastatin 40 mg - 1 tablet daily . Interventions: o Reviewed recent labs - triglycerides elevated o Recommend diabetes control to improve TG . Patient self care activities - Over the next 30 days, patient will: o Continue medication as prescribed o Slowly increase exercise with goal of 30 minutes, 5 days per week o Incorporate a healthy diet high in vegetables, fruits and whole grains with low-fat dairy products, chicken, fish, legumes, non-tropical vegetable oils and nuts. Limit intake of sweets, sugar-sweetened beverages and red meats.   Diabetes Lab Results  Component Value Date/Time   HGBA1C 9.6 (A) 05/03/2020 02:47 PM   HGBA1C 9.4 (A) 12/27/2019 08:58 AM   HGBA1C 10.8 (H) 08/23/2019 08:57 AM   HGBA1C 9.9 (H) 05/21/2019 09:13 AM   . Pharmacist Clinical Goal(s): o Over the next 30 days, patient will work with PharmD and providers to achieve A1c goal <7% . Current regimen:   Metformin XR 500 mg  - 2 tablets twice daily  Jardiance 10 mg - 1 tablet daily  Glipizide 10 mg BID  Tresiba - Inject 25 units daily  Freestyle Libre 1 . Interventions: o Recommend checking BG prior to breakfast, 2 hours  after meals, bedtime, and with symptoms of hypoglycemia - refill sensors for Libre o Continue increased dose of metformin - 2 tablets twice daily o Recommend re-trial of GLP-1 --> Ozempic  . Patient self care activities - Over the next 30 days, patient will: o Check blood sugar 3-4 times daily, document, and provide at future appointments o Continue metformin 2 tablets twice daily with meals  o Continue other medications same until further contact from pharmacist o Contact provider with any episodes of hypoglycemia  Please see past updates related to this goal by clicking on the "Past Updates" button in the selected goal        The patient verbalized understanding of instructions, educational materials, and care plan provided today and declined offer to receive copy of patient instructions, educational materials, and care plan.   The pharmacy team will reach out to the patient again over the next 30 days.   Phil Dopp, [facility]   Basics of Medicine Management Taking your medicines correctly is an important part of managing or preventing medical problems. Make sure you know what disease or condition your medicine is treating, and how and when to take it. If you do not take your medicine correctly, it may not work well and may cause unpleasant side effects, including serious health problems. What should I do when I am taking medicines?  Read all the labels and inserts that come with your medicines. Review the information  often.  Talk with your pharmacist if you get a refill and notice a change in the size, color, or shape of your medicines.  Know the potential side effects for each medicine that you take.  Try to get all your medicines from the same pharmacy. The pharmacist will have all your information and will understand how your medicines will affect each other (interact).  Tell your health care provider about all your medicines, including over-the-counter medicines, vitamins, and  herbal or dietary supplements. He or she will make sure that nothing will interact with any of your prescribed medicines.   How can I take my medicines safely?  Take medicines only as told by your health care provider. ? Do not take more of your medicine than instructed. ? Do not take anyone else's medicines. ? Do not share your medicines with others. ? Do not stop taking your medicines unless your health care provider tells you to do so. ? You may need to avoid alcohol or certain foods or liquids when taking certain medicines. Follow your health care provider's instructions.  Do not split, mash, or chew your medicines unless your health care provider tells you to do so. Tell your health care provider if you have trouble swallowing your medicines.  For liquid medicine, use the dosing container that was provided. How should I organize my medicines? Know your medicines  Know what each of your medicines looks like. This includes size, color, and shape. Tell your health care provider if you are having trouble recognizing all the medicines that you are taking.  If you cannot tell your medicines apart because they look similar, keep them in original bottles.  If you cannot read the labels on the bottles, tell your pharmacist to put your medicines in containers with large print.  Review your medicines and your schedule with family members, a friend, or a caregiver. Use a pill organizer  Use a tool to organize your medicine schedule. Tools include a weekly pillbox, a written chart, a notebook, or a calendar.  Your tool should help you remember the following things about each medicine: ? The name of the medicine. ? The amount (dose) to take. ? The schedule. This is the day and time the medicine should be taken. ? The appearance. This includes color, shape, size, and stamp. ? How to take your medicines. This includes instructions to take them with food, without food, with fluids, or with other  medicines.  Create reminders for taking your medicines. Use sticky notes, or alarms on your watch, mobile device, or phone calendar.  You may choose to use a more advanced management system. These systems have storage, alarms, and visual and audio prompts.  Some medicines can be taken on an "as-needed" basis. These include medicines for nausea or pain. If you take an as-needed medicine, write down the name and dose, as well as the date and time that you took it.   How should I plan for travel?  Take your pillbox, medicines, and organization system with you when traveling.  Have your medicines refilled before you travel. This will ensure that you do not run out of your medicines while you are away from home.  Always carry an updated list of your medicines with you. If there is an emergency, a first responder can quickly see what medicines you are taking.  Do not pack your medicines in checked luggage in case your luggage is lost or delayed.  If any of your medicines is considered  a controlled substance, make sure you bring a letter from your health care provider with you. How should I store and discard my medicines? For safe storage:  Store medicines in a cool, dry area away from light, or as directed by your health care provider. Do not store medicines in the bathroom. Heat and humidity will affect them.  Do not store your medicines with other chemicals, or with medicines for pets or other household members.  Keep medicines away from children and pets. Do not leave them on counters or bedside tables. Store them in high cabinets or on high shelves. For safe disposal:  Check expiration dates regularly. Do not take expired medicines. Discard medicines that are older than the expiration date.  Learn a safe way to dispose of your medicines. You may: ? Use a local government, hospital, or pharmacy medicine-take-back program. ? Mix the medicines with inedible substances, put them in a sealed  bag or empty container, and throw them in the trash. What should I remember?  Tell your health care provider if you: ? Experience side effects. ? Have new symptoms. ? Have other concerns about taking your medicines.  Review your medicines regularly with your health care provider. Other medicines, diet, medical conditions, weight changes, and daily habits can all affect how medicines work. Ask if you need to continue taking each medicine, and discuss how well each one is working.  Refill your medicines early to avoid running out of them.  In case of an accidental overdose, call your local Poison Control Center at [phone] or visit your local emergency department immediately. This is important. Summary  Taking your medicines correctly is an important part of managing or preventing medical problems.  You need to make sure that you understand what you are taking a medicine for, as well as how and when you need to take it.  Know your medicines and use a pill organizer to help you take your medicines correctly.  In case of an accidental overdose, call your local Poison Control Center at [phone] or visit your local emergency department immediately. This is important. This information is not intended to replace advice given to you by your health care provider. Make sure you discuss any questions you have with your health care provider. Document Revised: 11/13/2017 Document Reviewed: 11/13/2017 Elsevier Patient Education  2021 [employer].

## 2020-12-12 NOTE — Telephone Encounter (Signed)
I will have my CMA call to let him know. Lillia Abed, will you let him know about med changes and schedule a follow up call with you in 2-3 weeks to review tolerance and BG log.   Thanks!  Phil Dopp, PharmD Clinical Pharmacist [facility] at [facility] [phone]

## 2020-12-12 NOTE — Chronic Care Management (AMB) (Addendum)
Chronic Care Management Pharmacy Assistant   Name: Joseph [name]  MRN: [id number] DOB: 04-22-1971  Reason for Encounter: Medication Review  PCP : Joseph Koch, NP  Allergies:   Allergies  Allergen Reactions   Azithromycin Hives   Clarithromycin    Erythromycin Hives   Keflex [Cephalexin] Nausea Only   Metformin Nausea And Vomiting   Symbicort [Budesonide-Formoterol Fumarate] Other (See Comments)    States that 3 doses were used and breathing became worse    Medications: Outpatient Encounter Medications as of 12/11/2020  Medication Sig   Semaglutide,0.25 or 0.5MG/DOS, (OZEMPIC, 0.25 OR 0.5 MG/DOSE,) 2 MG/1.5ML SOPN Inject 0.25 mg into the skin once a week. For diabetes.   albuterol (PROVENTIL) (2.5 MG/3ML) 0.083% nebulizer solution USE 3 ML VIA NEBULIZER FOUR TIMES DAILY AS NEEDED FOR SHORTNESS OF BREATH   ANDROGEL PUMP 20.25 MG/ACT (1.62%) GEL Apply 2 Pump topically daily.    atorvastatin (LIPITOR) 40 MG tablet TAKE ONE TABLET (40MG TOTAL) BY MOUTH DAILY FOR CHOLESTEROL   benzonatate (TESSALON PERLES) 100 MG capsule Take 1 capsule (100 mg total) by mouth 3 (three) times daily as needed for cough.   blood glucose meter kit and supplies KIT Dispense based on patient and insurance preference. Use up to four times daily as directed. (FOR ICD-9 250.00, 250.01).   BREO ELLIPTA 200-25 MCG/INH AEPB INHALE 1 PUFF INTO THE LUNGS DAILY   buPROPion (WELLBUTRIN SR) 150 MG 12 hr tablet Take 1 tablet (150 mg total) by mouth 2 (two) times daily.   cetirizine (ZYRTEC) 10 MG tablet TAKE ONE TABLET (10MG TOTAL) BY MOUTH ATBEDTIME FOR ALLERGIES   citalopram (CELEXA) 40 MG tablet TAKE ONE (1) TABLET BY MOUTH EVERY DAY   cyclobenzaprine (FLEXERIL) 10 MG tablet Take 10 mg by mouth 2 (two) times daily as needed.   fluticasone (FLONASE) 50 MCG/ACT nasal spray Place 2 sprays into both nostrils daily.   gabapentin (NEURONTIN) 300 MG capsule TAKE 2 CAPSULES BY MOUTH TWICE A DAY.   glipiZIDE  (GLUCOTROL) 10 MG tablet TAKE ONE TABLET TWICE DAILY FOR DIABETES   insulin degludec (TRESIBA) 100 UNIT/ML FlexTouch Pen Inject 20 Units into the skin daily.   Insulin Pen Needle (PEN NEEDLES) 31G X 6 MM MISC Use nightly with insulin.   Ipratropium-Albuterol (COMBIVENT) 20-100 MCG/ACT AERS respimat Inhale 1 puff into the lungs every 4 (four) hours as needed for wheezing.   JARDIANCE 10 MG TABS tablet TAKE ONE TABLET (10MG) BY MOUTH EVERY MORNING FOR DIABETES   Lancets (ONETOUCH DELICA PLUS [id number]) MISC USE TO CHECK BLOOD SUGAR UP TO FOUR TIMES DAILY   lansoprazole (PREVACID) 30 MG capsule Take 1 capsule (30 mg total) by mouth 2 (two) times daily before a meal.   metFORMIN (GLUCOPHAGE-XR) 500 MG 24 hr tablet Take 2 tablets (1,000 mg total) by mouth 2 (two) times daily with a meal. For diabetes.   ONETOUCH VERIO test strip USE TO CHECK BLOOD SUGAR UP TO FOUR TIMES DAILY   tamsulosin (FLOMAX) 0.4 MG CAPS capsule TAKE ONE CAPSULE BY MOUTH DAILY   tapentadol HCl (NUCYNTA) 75 MG tablet Take 75 mg by mouth 2 (two) times daily.   Turmeric 500 MG CAPS Take 1 capsule by mouth daily.   zolpidem (AMBIEN) 10 MG tablet TAKE ONE TABLET (10MG TOTAL) BY MOUTH ATBEDTIME AS NEEDED FOR SLEEP   [DISCONTINUED] insulin degludec (TRESIBA) 100 UNIT/ML FlexTouch Pen Inject 25 Units into the skin daily.   [DISCONTINUED] metFORMIN (GLUCOPHAGE-XR) 500 MG 24 hr  tablet Take 2 tablets (1,000 mg total) by mouth daily with breakfast. For diabetes.   No facility-administered encounter medications on file as of 12/11/2020.    Current Diagnosis: Patient Active Problem List   Diagnosis Date Noted   Cervical lymphadenitis 11/27/2020   Preventative health care 07/05/2020   Leukocytosis 05/04/2020   Chronic fatigue 05/03/2020   Medicare annual wellness visit, subsequent 06/28/2019   Controlled substance agreement broken 09/14/2018   Primary osteoarthritis of knee 07/21/2018   Status post unicompartmental knee replacement,  left 07/21/2018   Medicare annual wellness visit, initial 05/20/2018   Effusion of knee joint (Left) 04/21/2018   Opiate overdose ([place]) 03/24/2018   Arthropathy of knee (Left) 01/12/2018   Opioid-induced constipation (OIC) 01/12/2018   Tinea corporis 01/07/2018   Tinea versicolor 01/07/2018   Pharmacologic therapy 12/08/2017   Problems influencing health status 12/08/2017   Long term prescription opiate use 12/08/2017   Opiate use 12/08/2017   Insomnia 11/20/2017   Disorder of skeletal system 11/05/2017   Chronic knee pain (Primary Area of Pain) (Bilateral) (L>R) 11/05/2017   Chronic wrist pain (Secondary Area of Pain) (Left) 11/05/2017   Chronic constipation 08/18/2017   Hyperlipidemia 05/12/2017   COPD without exacerbation ([place]) 05/07/2017   Male hypogonadism 04/24/2017   Post-void dribbling 04/01/2017   Osteoarthritis of the knee (Left)    Skin lesions 03/12/2017   GERD (gastroesophageal reflux disease) 03/12/2017   Bucket handle tear of meniscus of knee (Right)    Anxiety and depression 10/07/2015   Morbid obesity ([place]) 10/06/2015   OSA (obstructive sleep apnea) 08/31/2014   Testosterone deficiency 07/01/2014   S/P knee surgery 04/26/2014   Meniscal tear of knee, sequela (Left) 04/26/2014   Medial meniscus, posterior horn derangement 04/22/2014   Intrinsic asthma 12/19/2013   Chronic pain syndrome 05/13/2013   Diabetes mellitus type 2, uncontrolled, with complications ([place]) 62/26/3335   Mediastinal abnormality 04/10/2012   Cigarette smoker 04/10/2012   Joseph [name] was contacted to review medication changes made by Joseph Friendly, NP per CCM consult. Patient verified understanding and agreed to follow up call  with CMA on 12/26/2020 to review recent blood sugar readings.  Medications changes per Joseph [name] are as follows:   Continue metformin ER 500 mg 2 tablets twice daily with meals.  Start Ozempic 0.25 mg - Inject once weekly Decrease Tresiba to 20 units  daily  Follow-Up:  Pharmacist Review   Joseph [name], CPP notified  Joseph [name], [place] Assistant [phone]  I have reviewed the care management and care coordination activities outlined in this encounter and I am certifying that I agree with the content of this note. No further action required.  Joseph [name], PharmD Clinical Pharmacist [facility] at [facility] [phone]

## 2020-12-26 ENCOUNTER — Other Ambulatory Visit: Payer: Self-pay | Admitting: Primary Care

## 2020-12-26 ENCOUNTER — Telehealth: Payer: Self-pay

## 2020-12-26 DIAGNOSIS — F32A Depression, unspecified: Secondary | ICD-10-CM

## 2020-12-26 DIAGNOSIS — F419 Anxiety disorder, unspecified: Secondary | ICD-10-CM

## 2020-12-26 NOTE — Chronic Care Management (AMB) (Signed)
Chronic Care Management Pharmacy Assistant   Name: Joseph [name]  MRN: [id number] DOB: 09-27-1971  Reason for Encounter: Disease State- Diabetes  Patient Questions:  1.  Have you seen any other providers since your last visit? No    2.  Any changes in your medicines or health? Yes  Start Ozempic 0.25 mg - Inject once weekly  Decrease Tresiba to 20 units daily    PCP : Pleas Koch, NP  Allergies:   Allergies  Allergen Reactions  . Azithromycin Hives  . Clarithromycin   . Erythromycin Hives  . Keflex [Cephalexin] Nausea Only  . Metformin Nausea And Vomiting  . Symbicort [Budesonide-Formoterol Fumarate] Other (See Comments)    States that 3 doses were used and breathing became worse    Medications: Outpatient Encounter Medications as of 12/26/2020  Medication Sig  . albuterol (PROVENTIL) (2.5 MG/3ML) 0.083% nebulizer solution USE 3 ML VIA NEBULIZER FOUR TIMES DAILY AS NEEDED FOR SHORTNESS OF BREATH  . ANDROGEL PUMP 20.25 MG/ACT (1.62%) GEL Apply 2 Pump topically daily.   Marland Kitchen atorvastatin (LIPITOR) 40 MG tablet TAKE ONE TABLET (40MG TOTAL) BY MOUTH DAILY FOR CHOLESTEROL  . benzonatate (TESSALON PERLES) 100 MG capsule Take 1 capsule (100 mg total) by mouth 3 (three) times daily as needed for cough.  . blood glucose meter kit and supplies KIT Dispense based on patient and insurance preference. Use up to four times daily as directed. (FOR ICD-9 250.00, 250.01).  Marland Kitchen BREO ELLIPTA 200-25 MCG/INH AEPB INHALE 1 PUFF INTO THE LUNGS DAILY  . buPROPion (WELLBUTRIN SR) 150 MG 12 hr tablet Take 1 tablet (150 mg total) by mouth 2 (two) times daily.  . cetirizine (ZYRTEC) 10 MG tablet TAKE ONE TABLET (10MG TOTAL) BY MOUTH ATBEDTIME FOR ALLERGIES  . citalopram (CELEXA) 40 MG tablet TAKE ONE (1) TABLET BY MOUTH EVERY DAY  . cyclobenzaprine (FLEXERIL) 10 MG tablet Take 10 mg by mouth 2 (two) times daily as needed.  . fluticasone (FLONASE) 50 MCG/ACT nasal spray Place 2 sprays into both  nostrils daily.  Marland Kitchen gabapentin (NEURONTIN) 300 MG capsule TAKE 2 CAPSULES BY MOUTH TWICE A DAY.  Marland Kitchen glipiZIDE (GLUCOTROL) 10 MG tablet TAKE ONE TABLET TWICE DAILY FOR DIABETES  . insulin degludec (TRESIBA) 100 UNIT/ML FlexTouch Pen Inject 20 Units into the skin daily.  . Insulin Pen Needle (PEN NEEDLES) 31G X 6 MM MISC Use nightly with insulin.  . Ipratropium-Albuterol (COMBIVENT) 20-100 MCG/ACT AERS respimat Inhale 1 puff into the lungs every 4 (four) hours as needed for wheezing.  Marland Kitchen JARDIANCE 10 MG TABS tablet TAKE ONE TABLET (10MG) BY MOUTH EVERY MORNING FOR DIABETES  . Lancets (ONETOUCH DELICA PLUS [id number]) MISC USE TO CHECK BLOOD SUGAR UP TO FOUR TIMES DAILY  . lansoprazole (PREVACID) 30 MG capsule Take 1 capsule (30 mg total) by mouth 2 (two) times daily before a meal.  . metFORMIN (GLUCOPHAGE-XR) 500 MG 24 hr tablet Take 2 tablets (1,000 mg total) by mouth 2 (two) times daily with a meal. For diabetes.  Glory Rosebush VERIO test strip USE TO CHECK BLOOD SUGAR UP TO FOUR TIMES DAILY  . Semaglutide,0.25 or 0.5MG/DOS, (OZEMPIC, 0.25 OR 0.5 MG/DOSE,) 2 MG/1.5ML SOPN Inject 0.25 mg into the skin once a week. For diabetes.  . tamsulosin (FLOMAX) 0.4 MG CAPS capsule TAKE ONE CAPSULE BY MOUTH DAILY  . tapentadol HCl (NUCYNTA) 75 MG tablet Take 75 mg by mouth 2 (two) times daily.  . Turmeric 500 MG CAPS Take 1 capsule  by mouth daily.  Marland Kitchen zolpidem (AMBIEN) 10 MG tablet TAKE ONE TABLET (10MG TOTAL) BY MOUTH ATBEDTIME AS NEEDED FOR SLEEP   No facility-administered encounter medications on file as of 12/26/2020.    Current Diagnosis: Patient Active Problem List   Diagnosis Date Noted  . Cervical lymphadenitis 11/27/2020  . Preventative health care 07/05/2020  . Leukocytosis 05/04/2020  . Chronic fatigue 05/03/2020  . Medicare annual wellness visit, subsequent 06/28/2019  . Controlled substance agreement broken 09/14/2018  . Primary osteoarthritis of knee 07/21/2018  . Status post unicompartmental  knee replacement, left 07/21/2018  . Medicare annual wellness visit, initial 05/20/2018  . Effusion of knee joint (Left) 04/21/2018  . Opiate overdose ([place]) 03/24/2018  . Arthropathy of knee (Left) 01/12/2018  . Opioid-induced constipation (OIC) 01/12/2018  . Tinea corporis 01/07/2018  . Tinea versicolor 01/07/2018  . Pharmacologic therapy 12/08/2017  . Problems influencing health status 12/08/2017  . Long term prescription opiate use 12/08/2017  . Opiate use 12/08/2017  . Insomnia 11/20/2017  . Disorder of skeletal system 11/05/2017  . Chronic knee pain (Primary Area of Pain) (Bilateral) (L>R) 11/05/2017  . Chronic wrist pain (Secondary Area of Pain) (Left) 11/05/2017  . Chronic constipation 08/18/2017  . Hyperlipidemia 05/12/2017  . COPD without exacerbation ([place]) 05/07/2017  . Male hypogonadism 04/24/2017  . Post-void dribbling 04/01/2017  . Osteoarthritis of the knee (Left)   . Skin lesions 03/12/2017  . GERD (gastroesophageal reflux disease) 03/12/2017  . Bucket handle tear of meniscus of knee (Right)   . Anxiety and depression 10/07/2015  . Morbid obesity ([place]) 10/06/2015  . OSA (obstructive sleep apnea) 08/31/2014  . Testosterone deficiency 07/01/2014  . S/P knee surgery 04/26/2014  . Meniscal tear of knee, sequela (Left) 04/26/2014  . Medial meniscus, posterior horn derangement 04/22/2014  . Intrinsic asthma 12/19/2013  . Chronic pain syndrome 05/13/2013  . Diabetes mellitus type 2, uncontrolled, with complications ([place]) 16/55/3748  . Mediastinal abnormality 04/10/2012  . Cigarette smoker 04/10/2012   Attempted to contact Mr. Fick to follow up on blood glucose readings following change in medication ordered by [name], NP. Left multiple messages to return call. Unsuccessful outreach. Will follow up next month.   Follow-Up:  Pharmacist Review   Debbora Dus, CPP notified  Margaretmary Dys, [place] Pharm[facility] Assistant [phone]

## 2021-01-03 ENCOUNTER — Other Ambulatory Visit: Payer: Self-pay | Admitting: Primary Care

## 2021-01-03 DIAGNOSIS — E785 Hyperlipidemia, unspecified: Secondary | ICD-10-CM

## 2021-01-05 ENCOUNTER — Telehealth: Payer: Self-pay

## 2021-01-05 NOTE — Chronic Care Management (AMB) (Addendum)
Chronic Care Management Pharmacy Assistant   Name: Joseph [name]  MRN: [id number] DOB: [birth year]  Reason for Encounter: Disease State- Blood glucose log update  PCP : Pleas Koch, NP  Allergies:   Allergies  Allergen Reactions   Azithromycin Hives   Clarithromycin    Erythromycin Hives   Keflex [Cephalexin] Nausea Only   Metformin Nausea And Vomiting   Symbicort [Budesonide-Formoterol Fumarate] Other (See Comments)    States that 3 doses were used and breathing became worse    Medications: Outpatient Encounter Medications as of 01/05/2021  Medication Sig   albuterol (PROVENTIL) (2.5 MG/3ML) 0.083% nebulizer solution USE 3 ML VIA NEBULIZER FOUR TIMES DAILY AS NEEDED FOR SHORTNESS OF BREATH   ANDROGEL PUMP 20.25 MG/ACT (1.62%) GEL Apply 2 Pump topically daily.    atorvastatin (LIPITOR) 40 MG tablet TAKE ONE TABLET (40MG TOTAL) BY MOUTH DAILY FOR CHOLESTEROL   benzonatate (TESSALON PERLES) 100 MG capsule Take 1 capsule (100 mg total) by mouth 3 (three) times daily as needed for cough.   blood glucose meter kit and supplies KIT Dispense based on patient and insurance preference. Use up to four times daily as directed. (FOR ICD-9 250.00, 250.01).   BREO ELLIPTA 200-25 MCG/INH AEPB INHALE 1 PUFF INTO THE LUNGS DAILY   buPROPion (WELLBUTRIN SR) 150 MG 12 hr tablet Take 1 tablet (150 mg total) by mouth 2 (two) times daily.   cetirizine (ZYRTEC) 10 MG tablet TAKE ONE TABLET (10MG TOTAL) BY MOUTH ATBEDTIME FOR ALLERGIES   citalopram (CELEXA) 40 MG tablet Take 1 tablet (40 mg total) by mouth daily. For anxiety and depression.   cyclobenzaprine (FLEXERIL) 10 MG tablet Take 10 mg by mouth 2 (two) times daily as needed.   fluticasone (FLONASE) 50 MCG/ACT nasal spray Place 2 sprays into both nostrils daily.   gabapentin (NEURONTIN) 300 MG capsule TAKE 2 CAPSULES BY MOUTH TWICE A DAY.   glipiZIDE (GLUCOTROL) 10 MG tablet TAKE ONE TABLET TWICE DAILY FOR DIABETES   insulin degludec  (TRESIBA) 100 UNIT/ML FlexTouch Pen Inject 20 Units into the skin daily.   Insulin Pen Needle (PEN NEEDLES) 31G X 6 MM MISC Use nightly with insulin.   Ipratropium-Albuterol (COMBIVENT) 20-100 MCG/ACT AERS respimat Inhale 1 puff into the lungs every 4 (four) hours as needed for wheezing.   JARDIANCE 10 MG TABS tablet TAKE ONE TABLET (10MG) BY MOUTH EVERY MORNING FOR DIABETES   Lancets (ONETOUCH DELICA PLUS [id number]) MISC USE TO CHECK BLOOD SUGAR UP TO FOUR TIMES DAILY   lansoprazole (PREVACID) 30 MG capsule Take 1 capsule (30 mg total) by mouth 2 (two) times daily before a meal.   metFORMIN (GLUCOPHAGE-XR) 500 MG 24 hr tablet Take 2 tablets (1,000 mg total) by mouth 2 (two) times daily with a meal. For diabetes.   ONETOUCH VERIO test strip USE TO CHECK BLOOD SUGAR UP TO FOUR TIMES DAILY   Semaglutide,0.25 or 0.5MG/DOS, (OZEMPIC, 0.25 OR 0.5 MG/DOSE,) 2 MG/1.5ML SOPN Inject 0.25 mg into the skin once a week. For diabetes.   tamsulosin (FLOMAX) 0.4 MG CAPS capsule TAKE ONE CAPSULE BY MOUTH DAILY   tapentadol HCl (NUCYNTA) 75 MG tablet Take 75 mg by mouth 2 (two) times daily.   Turmeric 500 MG CAPS Take 1 capsule by mouth daily.   zolpidem (AMBIEN) 10 MG tablet TAKE ONE TABLET (10MG TOTAL) BY MOUTH ATBEDTIME AS NEEDED FOR SLEEP   No facility-administered encounter medications on file as of 01/05/2021.    Current Diagnosis: Patient Active  Problem List   Diagnosis Date Noted   Cervical lymphadenitis 11/27/2020   Preventative health care 07/05/2020   Leukocytosis 05/04/2020   Chronic fatigue 05/03/2020   Medicare annual wellness visit, subsequent 06/28/2019   Controlled substance agreement broken 09/14/2018   Primary osteoarthritis of knee 07/21/2018   Status post unicompartmental knee replacement, left 07/21/2018   Medicare annual wellness visit, initial 05/20/2018   Effusion of knee joint (Left) 04/21/2018   Opiate overdose ([place]) 03/24/2018   Arthropathy of knee (Left) 01/12/2018    Opioid-induced constipation ([facility]) 01/12/2018   Tinea corporis 01/07/2018   Tinea versicolor 01/07/2018   Pharmacologic therapy 12/08/2017   Problems influencing health status 12/08/2017   Long term prescription opiate use 12/08/2017   Opiate use 12/08/2017   Insomnia 11/20/2017   Disorder of skeletal system 11/05/2017   Chronic knee pain (Primary Area of Pain) (Bilateral) (L>R) 11/05/2017   Chronic wrist pain (Secondary Area of Pain) (Left) 11/05/2017   Chronic constipation 08/18/2017   Hyperlipidemia 05/12/2017   COPD without exacerbation ([place]) 05/07/2017   Male hypogonadism 04/24/2017   Post-void dribbling 04/01/2017   Osteoarthritis of the knee (Left)    Skin lesions 03/12/2017   GERD (gastroesophageal reflux disease) 03/12/2017   Bucket handle tear of meniscus of knee (Right)    Anxiety and depression 10/07/2015   Morbid obesity ([place]) 10/06/2015   OSA (obstructive sleep apnea) 08/31/2014   Testosterone deficiency 07/01/2014   S/P knee surgery 04/26/2014   Meniscal tear of knee, sequela (Left) 04/26/2014   Medial meniscus, posterior horn derangement 04/22/2014   Intrinsic asthma 12/19/2013   Chronic pain syndrome 05/13/2013   Diabetes mellitus type 2, uncontrolled, with complications ([place]) 97/33/1250   Mediastinal abnormality 04/10/2012   Cigarette smoker 04/10/2012   Attempted to contact Joseph [name] 3 separate dates to obtain updated BG log. Multiple messages left for patient to return call without success.  Current antihyperglycemic regimen:  MetforminER 500 mg 2 tablets twice daily Ozempic 0.25 mg - Inject once weekly (NEW) [name] Inject 20 units daily.   Follow-Up:  Pharmacist Review  Debbora Dus, CPP notified  Margaretmary Dys, [place] Assistant [phone]  I will move up his CCM appointment with me from March to February.   Debbora Dus, PharmD Clinical Pharmacist [facility] at [facility] [phone]

## 2021-01-12 ENCOUNTER — Telehealth: Payer: Medicare HMO

## 2021-01-16 ENCOUNTER — Other Ambulatory Visit (HOSPITAL_COMMUNITY): Payer: Self-pay | Admitting: Neurology

## 2021-01-16 ENCOUNTER — Other Ambulatory Visit: Payer: Self-pay | Admitting: Primary Care

## 2021-01-16 DIAGNOSIS — M25562 Pain in left knee: Secondary | ICD-10-CM

## 2021-01-16 DIAGNOSIS — M542 Cervicalgia: Secondary | ICD-10-CM | POA: Diagnosis not present

## 2021-01-16 DIAGNOSIS — G4700 Insomnia, unspecified: Secondary | ICD-10-CM

## 2021-01-16 DIAGNOSIS — M452 Ankylosing spondylitis of cervical region: Secondary | ICD-10-CM | POA: Diagnosis not present

## 2021-01-16 DIAGNOSIS — M545 Low back pain, unspecified: Secondary | ICD-10-CM | POA: Diagnosis not present

## 2021-01-16 DIAGNOSIS — Z79891 Long term (current) use of opiate analgesic: Secondary | ICD-10-CM | POA: Diagnosis not present

## 2021-01-16 DIAGNOSIS — M79642 Pain in left hand: Secondary | ICD-10-CM

## 2021-01-16 DIAGNOSIS — K5903 Drug induced constipation: Secondary | ICD-10-CM | POA: Diagnosis not present

## 2021-01-16 DIAGNOSIS — M5412 Radiculopathy, cervical region: Secondary | ICD-10-CM | POA: Diagnosis not present

## 2021-01-16 DIAGNOSIS — G8929 Other chronic pain: Secondary | ICD-10-CM

## 2021-02-01 ENCOUNTER — Other Ambulatory Visit: Payer: Self-pay | Admitting: Primary Care

## 2021-02-08 DIAGNOSIS — G4733 Obstructive sleep apnea (adult) (pediatric): Secondary | ICD-10-CM | POA: Diagnosis not present

## 2021-02-08 DIAGNOSIS — J449 Chronic obstructive pulmonary disease, unspecified: Secondary | ICD-10-CM | POA: Diagnosis not present

## 2021-02-13 ENCOUNTER — Other Ambulatory Visit: Payer: Self-pay

## 2021-02-13 ENCOUNTER — Ambulatory Visit (INDEPENDENT_AMBULATORY_CARE_PROVIDER_SITE_OTHER): Payer: BC Managed Care – PPO

## 2021-02-13 DIAGNOSIS — E785 Hyperlipidemia, unspecified: Secondary | ICD-10-CM | POA: Diagnosis not present

## 2021-02-13 DIAGNOSIS — E119 Type 2 diabetes mellitus without complications: Secondary | ICD-10-CM

## 2021-02-13 NOTE — Progress Notes (Addendum)
Contacted patient to discuss diabetes. He has not scheduled f/u with PCP as discussed in MyChart message on 01/05/21. He has not been checking BG lately due to work schedule. Still wearing Freestyle Libre, just not scanning often.  Current BG - 245 (ate about an hour ago) Average 228 for past 2 days   Current Medications:  Metformin XR 500 mg  - 2 tablets twice daily  Jardiance 10 mg - 1 tablet daily  Glipizide 10 mg - 1 tablet BID  Tresiba - Inject 25 units daily  Freestyle Libre 1  After last visit on 12/11/20, he tried Ozempic 0.25 mg x 1 dose. Reports heartburn and upset stomach. He is interested in trying again now that he knows to eat slowly and eat smaller, more frequent meals. Avoid fried, greasy and sweet foods. He will call with any concerns. He is aware symptoms generally improve after 4 weeks.   Follow Up: Coordinate PCP visit  Phil Dopp, PharmD Clinical Pharmacist [facility] [phone]

## 2021-02-13 NOTE — Progress Notes (Signed)
Joseph [name], would you mind contacting patient to schedule PCP visit for DM follow up? See MyChart message from 01/05/21.

## 2021-02-16 ENCOUNTER — Ambulatory Visit: Payer: Medicare HMO | Admitting: Primary Care

## 2021-02-23 ENCOUNTER — Ambulatory Visit (INDEPENDENT_AMBULATORY_CARE_PROVIDER_SITE_OTHER): Payer: BC Managed Care – PPO | Admitting: Primary Care

## 2021-02-23 ENCOUNTER — Other Ambulatory Visit: Payer: Self-pay

## 2021-02-23 ENCOUNTER — Encounter: Payer: Self-pay | Admitting: Primary Care

## 2021-02-23 VITALS — BP 122/62 | HR 100 | Temp 97.6°F | Ht 70.0 in | Wt 261.0 lb

## 2021-02-23 DIAGNOSIS — R21 Rash and other nonspecific skin eruption: Secondary | ICD-10-CM

## 2021-02-23 DIAGNOSIS — Z23 Encounter for immunization: Secondary | ICD-10-CM | POA: Diagnosis not present

## 2021-02-23 DIAGNOSIS — Z794 Long term (current) use of insulin: Secondary | ICD-10-CM | POA: Diagnosis not present

## 2021-02-23 DIAGNOSIS — E119 Type 2 diabetes mellitus without complications: Secondary | ICD-10-CM | POA: Diagnosis not present

## 2021-02-23 LAB — POCT GLYCOSYLATED HEMOGLOBIN (HGB A1C): Hemoglobin A1C: 8.3 % — AB (ref 4.0–5.6)

## 2021-02-23 MED ORDER — INSULIN DEGLUDEC 100 UNIT/ML ~~LOC~~ SOPN: 30.0000 [IU] | PEN_INJECTOR | Freq: Every day | SUBCUTANEOUS | 5 refills | Status: DC

## 2021-02-23 NOTE — Patient Instructions (Addendum)
You will be contacted regarding your referral to dermatology.  Please let us know if you have not been contacted within two weeks.   We increased your Joseph [name] to 30 units daily.  Schedule your physical for August 2022.  Call to schedule your eye exam as well as your colonoscopy.   It was a pleasure to see you today!

## 2021-02-23 NOTE — Assessment & Plan Note (Signed)
Chronic scabbing/abrasions to scalp. Referral placed to dermatology

## 2021-02-23 NOTE — Assessment & Plan Note (Signed)
Improved A1C of 8.3 from 9.6, but still uncontrolled.  Could not tolerate Ozempic due to GI side effects. Increase Tresiba to 30 units.  Continue Glipizide 10 mg BID and Jardiance 10 mg daily.  He will work on diet and exercise. Follow up in August 2022.

## 2021-02-23 NOTE — Addendum Note (Signed)
Addended by: Donnamarie Poag on: 02/23/2021 10:38 AM   Modules accepted: Orders

## 2021-02-23 NOTE — Progress Notes (Signed)
Subjective:    Patient ID: Joseph [name], male    DOB: [birth year], 50 y.o.   MRN: [id number]  HPI  Joseph [name] is a very pleasant 50 y.o. male with a history of type 2 diabetes, tobacco abuse, hyperlipidemia, chronic pain syndrome, hypogonadism who presents today for follow up of diabetes. He would also a referral to dermatology.  Current medications include: Glipizide 10 mg BID, Jardiance 10 mg daily, Tresiba 20 units. He could not tolerate Ozempic due to GI side effects. Since discontinuing Ozepmic he increased his [place] to 25 units.    He is checking his blood glucose numerous times daily and is getting readings of: low 100's-300's  Last A1C: 9.6 in July 2021 Last Eye Exam: Due Last Foot Exam: UTD Pneumonia Vaccination:  ACE/ARB: None. Urine microalbumin UTD Statin: Atorvastatin   He would like a referral to dermatology for chronic scabbing and sores to his scalp. Has also noticed moderate cracking to the sides of his mouth which are painful.    Review of Systems  Eyes: Negative for visual disturbance.  Cardiovascular: Negative for chest pain.  Neurological: Negative for dizziness and numbness.         Past Medical History:  Diagnosis Date  . Acute encephalopathy 03/24/2018  . Anxiety   . Arthritis    oa left knee and lower back  . Asthma   . Cataract    hx of bilateral  . Chronic back pain   . Chronic pain of left knee   . COPD (chronic obstructive pulmonary disease) ([place])   . Diabetes mellitus    type 2  . Encephalopathy acute 03/24/2018  . GERD (gastroesophageal reflux disease)   . INGUINAL PAIN, LEFT 10/03/2009   Qualifier: Diagnosis of  By: Oneida Alar MD, Sandi L   . Oral thrush 11/10/2018  . Overdose    03-24-18 accidental  . Shortness of breath    with heavy exertion  . Sleep apnea     Social History   Socioeconomic History  . Marital status: Divorced    Spouse name: Not on file  . Number of children: Not on file  . Years of education: Not on  file  . Highest education level: Not on file  Occupational History  . Occupation: disability  Tobacco Use  . Smoking status: Current Every Day Smoker    Packs/day: 1.00    Years: 26.00    Pack years: 26.00    Types: Cigarettes    Start date: 12/17/1993  . Smokeless tobacco: Never Used  . Tobacco comment: 1 ppd 01/04/2020  Vaping Use  . Vaping Use: Some days  Substance and Sexual Activity  . Alcohol use: Never    Alcohol/week: 0.0 standard drinks  . Drug use: Never  . Sexual activity: Yes    Birth control/protection: Surgical  Other Topics Concern  . Not on file  Social History Narrative   Recent visit to [facility] in [place] d/t increased pain where he was prescribed percocet 7.5-325 mg for pain.   Social Determinants of Health   Financial Resource Strain: Low Risk   . Difficulty of Paying Living Expenses: Not very hard  Food Insecurity: Not on file  Transportation Needs: Not on file  Physical Activity: Not on file  Stress: Not on file  Social Connections: Not on file  Intimate Partner Violence: Not on file    Past Surgical History:  Procedure Laterality Date  . CARPAL TUNNEL RELEASE Left   . CATARACT EXTRACTION W/PHACO  Right 03/11/2016   Procedure: CATARACT EXTRACTION PHACO AND INTRAOCULAR LENS PLACEMENT (IOC);  Surgeon: Tonny Branch, MD;  Location: [facility];  Service: Ophthalmology;  Laterality: Right;  CDE 4.24  . EYE SURGERY Right 2018   ioc with lens replacement  . HERNIA REPAIR Bilateral 2876   umbilical  . KNEE ARTHROSCOPY WITH MEDIAL MENISECTOMY Left 04/22/2014   Procedure: LEFT KNEE ARTHROSCOPY WITH MEDIAL MENISECTOMY;  Surgeon: Carole Civil, MD;  Location: [facility];  Service: Orthopedics;  Laterality: Left;  . KNEE ARTHROSCOPY WITH MEDIAL MENISECTOMY Right 12/06/2015   Procedure: RIGHT KNEE ARTHROSCOPY WITH MEDIAL MENISECTOMY;  Surgeon: Carole Civil, MD;  Location: [facility];  Service: Orthopedics;  Laterality: Right;  . KNEE ARTHROSCOPY WITH MEDIAL MENISECTOMY  Left 03/20/2017   Procedure: KNEE ARTHROSCOPY WITH MEDIAL MENISECTOMY;  Surgeon: Carole Civil, MD;  Location: [facility];  Service: Orthopedics;  Laterality: Left;  . ORIF WRIST FRACTURE Left 12/19/2016   Procedure: OPEN REDUCTION INTERNAL FIXATION (ORIF) WRIST FRACTURE;  Surgeon: Leanora Cover, MD;  Location: [facility];  Service: Orthopedics;  Laterality: Left;  accumed   . PARTIAL KNEE ARTHROPLASTY Left 07/21/2018   Procedure: LEFT UNICOMPARTMENTAL KNEE;  Surgeon: Renette Butters, MD;  Location: [facility];  Service: Orthopedics;  Laterality: Left;  . SHOULDER ARTHROCENTESIS Right 2008 or 2010  . VASECTOMY  2014    Family History  Problem Relation Age of Onset  . Emphysema Maternal Grandfather   . Emphysema Maternal Grandmother   . Clotting disorder Maternal Grandmother     Allergies  Allergen Reactions  . Azithromycin Hives  . Clarithromycin   . Erythromycin Hives  . Keflex [Cephalexin] Nausea Only  . Metformin Nausea And Vomiting  . Symbicort [Budesonide-Formoterol Fumarate] Other (See Comments)    States that 3 doses were used and breathing became worse    Current Outpatient Medications on File Prior to Visit  Medication Sig Dispense Refill  . albuterol (PROVENTIL) (2.5 MG/3ML) 0.083% nebulizer solution USE 3 ML VIA NEBULIZER FOUR TIMES DAILY AS NEEDED FOR SHORTNESS OF BREATH 360 mL 5  . ANDROGEL PUMP 20.25 MG/ACT (1.62%) GEL Apply 2 Pump topically daily.     Marland Kitchen atorvastatin (LIPITOR) 40 MG tablet TAKE ONE TABLET (40MG TOTAL) BY MOUTH DAILY FOR CHOLESTEROL 90 tablet 3  . blood glucose meter kit and supplies KIT Dispense based on patient and insurance preference. Use up to four times daily as directed. (FOR ICD-9 250.00, 250.01). 1 each 0  . BREO ELLIPTA 200-25 MCG/INH AEPB INHALE 1 PUFF INTO THE LUNGS DAILY 60 each 5  . buPROPion (WELLBUTRIN SR) 150 MG 12 hr tablet Take 1 tablet (150 mg total) by mouth 2 (two) times daily. 180 tablet 3  . cetirizine (ZYRTEC) 10 MG  tablet TAKE ONE TABLET (10MG TOTAL) BY MOUTH ATBEDTIME FOR ALLERGIES 90 tablet 1  . citalopram (CELEXA) 40 MG tablet Take 1 tablet (40 mg total) by mouth daily. For anxiety and depression. 90 tablet 1  . Continuous Blood Gluc Sensor (FREESTYLE LIBRE 14 DAY SENSOR) MISC USE TO TEST BLOOD SUGAR 2 each 2  . cyclobenzaprine (FLEXERIL) 10 MG tablet Take 10 mg by mouth 2 (two) times daily as needed.    . fluticasone (FLONASE) 50 MCG/ACT nasal spray Place 2 sprays into both nostrils daily. 16 g 0  . gabapentin (NEURONTIN) 300 MG capsule TAKE 2 CAPSULES BY MOUTH TWICE A DAY. 360 capsule 1  . glipiZIDE (GLUCOTROL) 10 MG tablet TAKE ONE TABLET TWICE DAILY FOR DIABETES  180 tablet 3  . insulin degludec (TRESIBA) 100 UNIT/ML FlexTouch Pen Inject 20 Units into the skin daily. 15 mL 2  . Insulin Pen Needle (PEN NEEDLES) 31G X 6 MM MISC Use nightly with insulin. 100 each 5  . Ipratropium-Albuterol (COMBIVENT) 20-100 MCG/ACT AERS respimat Inhale 1 puff into the lungs every 4 (four) hours as needed for wheezing. 4 g 11  . JARDIANCE 10 MG TABS tablet TAKE ONE TABLET (10MG) BY MOUTH EVERY MORNING FOR DIABETES 90 tablet 1  . Lancets (ONETOUCH DELICA PLUS [id number]) MISC USE TO CHECK BLOOD SUGAR UP TO FOUR TIMES DAILY 100 each 5  . lansoprazole (PREVACID) 30 MG capsule Take 1 capsule (30 mg total) by mouth 2 (two) times daily before a meal. 180 capsule 1  . metFORMIN (GLUCOPHAGE-XR) 500 MG 24 hr tablet Take 2 tablets (1,000 mg total) by mouth 2 (two) times daily with a meal. For diabetes. 360 tablet 3  . NUCYNTA ER 100 MG 12 hr tablet SMARTSIG:1 Tablet(s) By Mouth Every 12 Hours    . ONETOUCH VERIO test strip USE TO CHECK BLOOD SUGAR UP TO FOUR TIMES DAILY 100 strip 5  . tamsulosin (FLOMAX) 0.4 MG CAPS capsule TAKE ONE CAPSULE BY MOUTH DAILY 90 capsule 1  . Turmeric 500 MG CAPS Take 1 capsule by mouth daily.    Marland Kitchen zolpidem (AMBIEN) 10 MG tablet TAKE ONE TABLET (10MG TOTAL) BY MOUTH ATBEDTIME AS NEEDED FOR SLEEP 90 tablet  0   No current facility-administered medications on file prior to visit.    BP 122/62   Pulse 100   Temp 97.6 F (36.4 C) (Temporal)   Ht _0  (1.778 m)   Wt 261 lb (118.4 kg)   SpO2 100%   BMI 37.45 kg/m  Objective:   Physical Exam Cardiovascular:     Rate and Rhythm: Normal rate and regular rhythm.  Pulmonary:     Effort: Pulmonary effort is normal.     Breath sounds: Normal breath sounds.  Skin:    General: Skin is warm and dry.     Comments: Abrasions and scabbing noted to scalp.  Psychiatric:        Mood and Affect: Mood normal.           Assessment & Plan:      This visit occurred during the SARS-CoV-2 public health emergency.  Safety protocols were in place, including screening questions prior to the visit, additional usage of staff PPE, and extensive cleaning of exam room while observing appropriate contact time as indicated for disinfecting solutions.

## 2021-03-05 ENCOUNTER — Encounter: Payer: Self-pay | Admitting: Emergency Medicine

## 2021-03-05 ENCOUNTER — Emergency Department (HOSPITAL_COMMUNITY)
Admission: EM | Admit: 2021-03-05 | Discharge: 2021-03-06 | Disposition: A | Discharge status: Home / Self Care | Payer: BC Managed Care – PPO | Attending: Emergency Medicine | Admitting: Emergency Medicine

## 2021-03-05 ENCOUNTER — Ambulatory Visit
Admission: EM | Admit: 2021-03-05 | Discharge: 2021-03-05 | Disposition: A | Discharge status: Home / Self Care | Payer: Medicare HMO

## 2021-03-05 ENCOUNTER — Encounter (HOSPITAL_COMMUNITY): Payer: Self-pay | Admitting: Emergency Medicine

## 2021-03-05 ENCOUNTER — Telehealth: Payer: Self-pay

## 2021-03-05 ENCOUNTER — Other Ambulatory Visit: Payer: Self-pay

## 2021-03-05 ENCOUNTER — Emergency Department (HOSPITAL_COMMUNITY): Payer: BC Managed Care – PPO

## 2021-03-05 DIAGNOSIS — J45909 Unspecified asthma, uncomplicated: Secondary | ICD-10-CM | POA: Insufficient documentation

## 2021-03-05 DIAGNOSIS — Z96652 Presence of left artificial knee joint: Secondary | ICD-10-CM | POA: Insufficient documentation

## 2021-03-05 DIAGNOSIS — G4700 Insomnia, unspecified: Secondary | ICD-10-CM | POA: Diagnosis not present

## 2021-03-05 DIAGNOSIS — R0602 Shortness of breath: Secondary | ICD-10-CM | POA: Diagnosis not present

## 2021-03-05 DIAGNOSIS — R140 Abdominal distension (gaseous): Secondary | ICD-10-CM

## 2021-03-05 DIAGNOSIS — R143 Flatulence: Secondary | ICD-10-CM | POA: Diagnosis not present

## 2021-03-05 DIAGNOSIS — Z28310 Unvaccinated for covid-19: Secondary | ICD-10-CM | POA: Diagnosis not present

## 2021-03-05 DIAGNOSIS — R5383 Other fatigue: Secondary | ICD-10-CM

## 2021-03-05 DIAGNOSIS — R739 Hyperglycemia, unspecified: Secondary | ICD-10-CM

## 2021-03-05 DIAGNOSIS — J449 Chronic obstructive pulmonary disease, unspecified: Secondary | ICD-10-CM | POA: Insufficient documentation

## 2021-03-05 DIAGNOSIS — Z7984 Long term (current) use of oral hypoglycemic drugs: Secondary | ICD-10-CM | POA: Diagnosis not present

## 2021-03-05 DIAGNOSIS — Z794 Long term (current) use of insulin: Secondary | ICD-10-CM

## 2021-03-05 DIAGNOSIS — L853 Xerosis cutis: Secondary | ICD-10-CM | POA: Insufficient documentation

## 2021-03-05 DIAGNOSIS — K5909 Other constipation: Secondary | ICD-10-CM | POA: Diagnosis not present

## 2021-03-05 DIAGNOSIS — F17210 Nicotine dependence, cigarettes, uncomplicated: Secondary | ICD-10-CM | POA: Insufficient documentation

## 2021-03-05 DIAGNOSIS — Z20822 Contact with and (suspected) exposure to covid-19: Secondary | ICD-10-CM | POA: Diagnosis not present

## 2021-03-05 DIAGNOSIS — E1165 Type 2 diabetes mellitus with hyperglycemia: Secondary | ICD-10-CM | POA: Diagnosis not present

## 2021-03-05 DIAGNOSIS — E119 Type 2 diabetes mellitus without complications: Secondary | ICD-10-CM | POA: Diagnosis not present

## 2021-03-05 DIAGNOSIS — Z7951 Long term (current) use of inhaled steroids: Secondary | ICD-10-CM | POA: Insufficient documentation

## 2021-03-05 DIAGNOSIS — R531 Weakness: Secondary | ICD-10-CM | POA: Diagnosis not present

## 2021-03-05 DIAGNOSIS — R682 Dry mouth, unspecified: Secondary | ICD-10-CM | POA: Insufficient documentation

## 2021-03-05 DIAGNOSIS — M542 Cervicalgia: Secondary | ICD-10-CM | POA: Diagnosis not present

## 2021-03-05 LAB — CBC WITH DIFFERENTIAL/PLATELET
Abs Immature Granulocytes: 0.04 10*3/uL (ref 0.00–0.07)
Basophils Absolute: 0.2 10*3/uL — ABNORMAL HIGH (ref 0.0–0.1)
Basophils Relative: 1 %
Eosinophils Absolute: 0.5 10*3/uL (ref 0.0–0.5)
Eosinophils Relative: 4 %
HCT: 46.9 % (ref 39.0–52.0)
Hemoglobin: 15.8 g/dL (ref 13.0–17.0)
Immature Granulocytes: 0 %
Lymphocytes Relative: 26 %
Lymphs Abs: 3.1 10*3/uL (ref 0.7–4.0)
MCH: 30.4 pg (ref 26.0–34.0)
MCHC: 33.7 g/dL (ref 30.0–36.0)
MCV: 90.4 fL (ref 80.0–100.0)
Monocytes Absolute: 0.8 10*3/uL (ref 0.1–1.0)
Monocytes Relative: 7 %
Neutro Abs: 7.6 10*3/uL (ref 1.7–7.7)
Neutrophils Relative %: 62 %
Platelets: 257 10*3/uL (ref 150–400)
RBC: 5.19 MIL/uL (ref 4.22–5.81)
RDW: 14.3 % (ref 11.5–15.5)
WBC: 12.2 10*3/uL — ABNORMAL HIGH (ref 4.0–10.5)
nRBC: 0 % (ref 0.0–0.2)

## 2021-03-05 LAB — BLOOD GAS, VENOUS
Acid-base deficit: 0.3 mmol/L (ref 0.0–2.0)
Bicarbonate: 23.1 mmol/L (ref 20.0–28.0)
FIO2: 21
O2 Saturation: 72.8 %
Patient temperature: 37
pCO2, Ven: 44.2 mmHg (ref 44.0–60.0)
pH, Ven: 7.361 (ref 7.250–7.430)
pO2, Ven: 38.7 mmHg (ref 32.0–45.0)

## 2021-03-05 LAB — URINALYSIS, ROUTINE W REFLEX MICROSCOPIC
Bacteria, UA: NONE SEEN
Bilirubin Urine: NEGATIVE
Glucose, UA: >=500 mg/dL — AB
Hgb urine dipstick: NEGATIVE
Ketones, ur: NEGATIVE mg/dL
Leukocytes,Ua: NEGATIVE
Nitrite: NEGATIVE
Protein, ur: NEGATIVE mg/dL
Specific Gravity, Urine: 1.027 (ref 1.005–1.030)
pH: 6 (ref 5.0–8.0)

## 2021-03-05 LAB — BASIC METABOLIC PANEL
Anion gap: 11 (ref 5–15)
BUN: 11 mg/dL (ref 6–20)
CO2: 22 mmol/L (ref 22–32)
Calcium: 9.2 mg/dL (ref 8.9–10.3)
Chloride: 99 mmol/L (ref 98–111)
Creatinine, Ser: 0.94 mg/dL (ref 0.61–1.24)
GFR, Estimated: >60 mL/min (ref >60)
Glucose, Bld: 371 mg/dL — ABNORMAL HIGH (ref 70–99)
Potassium: 3.8 mmol/L (ref 3.5–5.1)
Sodium: 132 mmol/L — ABNORMAL LOW (ref 135–145)

## 2021-03-05 LAB — RESP PANEL BY RT-PCR (FLU A&B, COVID) ARPGX2
Influenza A by PCR: NEGATIVE
Influenza B by PCR: NEGATIVE
SARS Coronavirus 2 by RT PCR: NEGATIVE

## 2021-03-05 LAB — CBG MONITORING, ED: Glucose-Capillary: 296 mg/dL — ABNORMAL HIGH (ref 70–99)

## 2021-03-05 MED ORDER — SODIUM CHLORIDE 0.9 % IV BOLUS
1000.0000 mL | Freq: Once | INTRAVENOUS | Status: AC
  Administered 2021-03-05: 1000 mL via INTRAVENOUS

## 2021-03-05 NOTE — Telephone Encounter (Signed)
Joseph Reaper NP would want pt to have a 30' appt with multiple chronic and acute issues. I cancelled appt with Joseph Hurry NP on 12/06/20. I called pt and advised and pt voiced understanding and Joseph Gitelman NP first available appt is 03/14/21 and pt is going to go to [facility] ED. Sending note to Joseph Gitelman NP who is out of office and Joseph Hurry NP.

## 2021-03-05 NOTE — Telephone Encounter (Signed)
Will discuss at upcoming appt.

## 2021-03-05 NOTE — ED Triage Notes (Signed)
Pt states he is having increased weakness over the last 2 weeks, pain in back of his neck, has no energy, and other vague complaints.

## 2021-03-05 NOTE — ED Provider Notes (Signed)
Pt's care assumed at 9pm.  ua pending.  Pt getting IV fluids.   ua negative,  Pt's glucose decreased from 371 to 296.  Anion gap is 11, co2 is 22.  No sign of dka.   Pt advised to continue home diabetes medications.  See primary MD for recheck    Osie Cheeks 03/05/21 [id number]    Jacalyn Lefevre, MD 03/06/21 [phone]

## 2021-03-05 NOTE — ED Triage Notes (Signed)
Feels weak, not sleeping well, feels bloated and gassey.

## 2021-03-05 NOTE — Telephone Encounter (Signed)
Crystal RN with Access nurse said pt was on phone and pt had been to [facility] but pt said was told there was nothing they could do and pt left; A couple of days ago started with scratchy S/T, H/a pain level 3, ? Thrush with white patches on tongue, and generalized weakness, I spoke with Laurelyn Sickle at [facility] who transferred me to a nurse name Season; Season said pt was just discharged a few mins ago after being seen at [facility]. Season said no meds were prescribed. Pt said he was not examined but was asked questions and then was told they could not do any testing and there was nothing else they could do for pt. Season said pt did not have thrush. Pt does not have fever, pt does have slight dry cough and sinus congestion per pt. Pt does not want to go to another UC and scheduled video visit on 03/06/21 at 9:15 with Pamala Hurry NP. UC & ED precautions given and pt voiced understanding. Sending note to Allayne Gitelman NP as PCP and out of the office and Pamala Hurry NP in office.

## 2021-03-05 NOTE — ED Provider Notes (Signed)
[facility] EMERGENCY DEPARTMENT Provider Note   CSN: [id number] Arrival date & time: 03/05/21  1908     History Chief Complaint  Patient presents with  . Weakness    Joseph [name] is a 50 y.o. male.  HPI   Pt is a 50 y/o male with a h/o encephalopathy, anxiety, arthritis, asthma, chroinc back pain, chronic knee pain, COPD, DM, GERD, accidental overdose, sob, sleep apnea, who presents to the ED today for eval of generalized weakness that started about 2 week ago and has been getting worse.  Reports sweats. Denies fevers, cough, abd pain, NVD, dysuria, frequency, urgency, chest pain, shortness of breath.   He c/o multiple various chronic complaints including increased gasiness, dry mouth, cracked lips, neck pain. Reports some mild allergies.  Has not been vaccinated. He states he works in [place]. He is not sure if he has been exposure to covid.   Past Medical History:  Diagnosis Date  . Acute encephalopathy 03/24/2018  . Anxiety   . Arthritis    oa left knee and lower back  . Asthma   . Cataract    hx of bilateral  . Chronic back pain   . Chronic pain of left knee   . COPD (chronic obstructive pulmonary disease) ([place])   . Diabetes mellitus    type 2  . Encephalopathy acute 03/24/2018  . GERD (gastroesophageal reflux disease)   . INGUINAL PAIN, LEFT 10/03/2009   Qualifier: Diagnosis of  By: Oneida Alar MD, Sandi L   . Oral thrush 11/10/2018  . Overdose    03-24-18 accidental  . Shortness of breath    with heavy exertion  . Sleep apnea     Patient Active Problem List   Diagnosis Date Noted  . Cervical lymphadenitis 11/27/2020  . Preventative health care 07/05/2020  . Leukocytosis 05/04/2020  . Chronic fatigue 05/03/2020  . Medicare annual wellness visit, subsequent 06/28/2019  . Controlled substance agreement broken 09/14/2018  . Primary osteoarthritis of knee 07/21/2018  . Status post unicompartmental knee replacement, left 07/21/2018  . Medicare  annual wellness visit, initial 05/20/2018  . Effusion of knee joint (Left) 04/21/2018  . Opiate overdose ([place]) 03/24/2018  . Arthropathy of knee (Left) 01/12/2018  . Opioid-induced constipation (OIC) 01/12/2018  . Tinea corporis 01/07/2018  . Tinea versicolor 01/07/2018  . Pharmacologic therapy 12/08/2017  . Problems influencing health status 12/08/2017  . Long term prescription opiate use 12/08/2017  . Opiate use 12/08/2017  . Insomnia 11/20/2017  . Disorder of skeletal system 11/05/2017  . Chronic knee pain (Primary Area of Pain) (Bilateral) (L>R) 11/05/2017  . Chronic wrist pain (Secondary Area of Pain) (Left) 11/05/2017  . Chronic constipation 08/18/2017  . Hyperlipidemia 05/12/2017  . COPD without exacerbation ([place]) 05/07/2017  . Male hypogonadism 04/24/2017  . Post-void dribbling 04/01/2017  . Osteoarthritis of the knee (Left)   . Rash and nonspecific skin eruption 03/12/2017  . GERD (gastroesophageal reflux disease) 03/12/2017  . Bucket handle tear of meniscus of knee (Right)   . Anxiety and depression 10/07/2015  . Morbid obesity ([place]) 10/06/2015  . OSA (obstructive sleep apnea) 08/31/2014  . Testosterone deficiency 07/01/2014  . S/P knee surgery 04/26/2014  . Meniscal tear of knee, sequela (Left) 04/26/2014  . Medial meniscus, posterior horn derangement 04/22/2014  . Intrinsic asthma 12/19/2013  . Chronic pain syndrome 05/13/2013  . Type 2 diabetes mellitus ([place]) 05/13/2013  . Mediastinal abnormality 04/10/2012  . Cigarette smoker 04/10/2012    Past Surgical History:  Procedure Laterality Date  . CARPAL TUNNEL RELEASE Left   . CATARACT EXTRACTION W/PHACO Right 03/11/2016   Procedure: CATARACT EXTRACTION PHACO AND INTRAOCULAR LENS PLACEMENT (IOC);  Surgeon: Tonny Branch, MD;  Location: [facility];  Service: Ophthalmology;  Laterality: Right;  CDE 4.24  . EYE SURGERY Right 2018   ioc with lens replacement  . HERNIA REPAIR Bilateral 6962   umbilical  . KNEE ARTHROSCOPY  WITH MEDIAL MENISECTOMY Left 04/22/2014   Procedure: LEFT KNEE ARTHROSCOPY WITH MEDIAL MENISECTOMY;  Surgeon: Carole Civil, MD;  Location: [facility];  Service: Orthopedics;  Laterality: Left;  . KNEE ARTHROSCOPY WITH MEDIAL MENISECTOMY Right 12/06/2015   Procedure: RIGHT KNEE ARTHROSCOPY WITH MEDIAL MENISECTOMY;  Surgeon: Carole Civil, MD;  Location: [facility];  Service: Orthopedics;  Laterality: Right;  . KNEE ARTHROSCOPY WITH MEDIAL MENISECTOMY Left 03/20/2017   Procedure: KNEE ARTHROSCOPY WITH MEDIAL MENISECTOMY;  Surgeon: Carole Civil, MD;  Location: [facility];  Service: Orthopedics;  Laterality: Left;  . ORIF WRIST FRACTURE Left 12/19/2016   Procedure: OPEN REDUCTION INTERNAL FIXATION (ORIF) WRIST FRACTURE;  Surgeon: Leanora Cover, MD;  Location: [place];  Service: Orthopedics;  Laterality: Left;  accumed   . PARTIAL KNEE ARTHROPLASTY Left 07/21/2018   Procedure: LEFT UNICOMPARTMENTAL KNEE;  Surgeon: Renette Butters, MD;  Location: [facility];  Service: Orthopedics;  Laterality: Left;  . SHOULDER ARTHROCENTESIS Right 2008 or 2010  . VASECTOMY  2014       Family History  Problem Relation Age of Onset  . Emphysema Maternal Grandfather   . Emphysema Maternal Grandmother   . Clotting disorder Maternal Grandmother     Social History   Tobacco Use  . Smoking status: Current Every Day Smoker    Packs/day: 1.00    Years: 26.00    Pack years: 26.00    Types: Cigarettes    Start date: 12/17/1993  . Smokeless tobacco: Never Used  . Tobacco comment: 1 ppd 01/04/2020  Vaping Use  . Vaping Use: Some days  Substance Use Topics  . Alcohol use: Never    Alcohol/week: 0.0 standard drinks  . Drug use: Never    Home Medications Prior to Admission medications   Medication Sig Start Date End Date Taking? Authorizing Provider  albuterol (PROVENTIL) (2.5 MG/3ML) 0.083% nebulizer solution USE 3 ML VIA NEBULIZER FOUR TIMES DAILY AS NEEDED FOR SHORTNESS OF BREATH 10/02/20   Tanda Rockers, MD  ANDROGEL PUMP 20.25 MG/ACT (1.62%) GEL Apply 2 Pump topically daily.  02/25/18   [provider]  atorvastatin (LIPITOR) 40 MG tablet TAKE ONE TABLET (40MG TOTAL) BY MOUTH DAILY FOR CHOLESTEROL 06/26/20   Pleas Koch, NP  blood glucose meter kit and supplies KIT Dispense based on patient and insurance preference. Use up to four times daily as directed. (FOR ICD-9 250.00, 250.01). 06/28/19   Pleas Koch, NP  BREO ELLIPTA 200-25 MCG/INH AEPB INHALE 1 PUFF INTO THE LUNGS DAILY 11/17/20   Tanda Rockers, MD  buPROPion [facility] SR) 150 MG 12 hr tablet Take 1 tablet (150 mg total) by mouth 2 (two) times daily. 07/05/20   Pleas Koch, NP  cetirizine (ZYRTEC) 10 MG tablet TAKE ONE TABLET (10MG TOTAL) BY MOUTH ATBEDTIME FOR ALLERGIES 10/12/20   Pleas Koch, NP  citalopram (CELEXA) 40 MG tablet Take 1 tablet (40 mg total) by mouth daily. For anxiety and depression. 12/26/20   Pleas Koch, NP  Continuous Blood Gluc Sensor (FREESTYLE LIBRE 14 DAY  SENSOR) MISC USE TO TEST BLOOD SUGAR 02/01/21   Pleas Koch, NP  cyclobenzaprine (FLEXERIL) 10 MG tablet Take 10 mg by mouth 2 (two) times daily as needed.    [provider]  fluticasone (FLONASE) 50 MCG/ACT nasal spray Place 2 sprays into both nostrils daily. 02/22/20   Jearld Fenton, NP  gabapentin (NEURONTIN) 300 MG capsule TAKE 2 CAPSULES BY MOUTH TWICE A DAY. 11/28/20   Jearld Fenton, NP  glipiZIDE (GLUCOTROL) 10 MG tablet TAKE ONE TABLET TWICE DAILY FOR DIABETES 03/22/20   Pleas Koch, NP  insulin degludec (TRESIBA) 100 UNIT/ML FlexTouch Pen Inject 30 Units into the skin daily. 02/23/21   Pleas Koch, NP  Insulin Pen Needle (PEN NEEDLES) 31G X 6 MM MISC Use nightly with insulin. 06/14/20   Pleas Koch, NP  Ipratropium-Albuterol (COMBIVENT) 20-100 MCG/ACT AERS respimat Inhale 1 puff into the lungs every 4 (four) hours as needed for wheezing. 01/04/20   Tanda Rockers, MD   JARDIANCE 10 MG TABS tablet TAKE ONE TABLET (10MG) BY MOUTH EVERY MORNING FOR DIABETES 09/25/20   Pleas Koch, NP  Lancets [facility] DELICA PLUS [id number]) MISC USE TO CHECK BLOOD SUGAR UP TO FOUR TIMES DAILY 09/14/19   Pleas Koch, NP  lansoprazole (PREVACID) 30 MG capsule Take 1 capsule (30 mg total) by mouth 2 (two) times daily before a meal. 11/07/20   Pleas Koch, NP  metFORMIN (GLUCOPHAGE-XR) 500 MG 24 hr tablet Take 2 tablets (1,000 mg total) by mouth 2 (two) times daily with a meal. For diabetes. 12/11/20   Pleas Koch, NP  NUCYNTA ER 100 MG 12 hr tablet SMARTSIG:1 Tablet(s) By Mouth Every 12 Hours 02/13/21   [provider]  [facility] VERIO test strip USE TO CHECK BLOOD SUGAR UP TO FOUR TIMES DAILY 09/14/19   Pleas Koch, NP  tamsulosin (FLOMAX) 0.4 MG CAPS capsule TAKE ONE CAPSULE BY MOUTH DAILY 07/21/19   Pleas Koch, NP  Turmeric 500 MG CAPS Take 1 capsule by mouth daily.    [provider]  zolpidem (AMBIEN) 10 MG tablet TAKE ONE TABLET (10MG TOTAL) BY MOUTH ATBEDTIME AS NEEDED FOR SLEEP 01/17/21   Pleas Koch, NP    Allergies    Azithromycin, Clarithromycin, Erythromycin, Keflex [cephalexin], Metformin, and Symbicort [budesonide-formoterol fumarate]  Review of Systems   Review of Systems  Constitutional: Positive for chills and fatigue. Negative for fever.  HENT: Negative for ear pain and sore throat.        Dry mouth  Eyes: Negative for pain and visual disturbance.  Respiratory: Negative for cough and shortness of breath.   Cardiovascular: Negative for chest pain.  Gastrointestinal: Negative for abdominal pain, constipation, diarrhea, nausea and vomiting.  Genitourinary: Negative for dysuria and hematuria.  Musculoskeletal: Negative for back pain and myalgias.  Skin: Negative for rash.  Neurological: Positive for weakness (generalized). Negative for headaches.  All other systems reviewed and are  negative.   Physical Exam Updated Vital Signs BP 134/82   Pulse 93   Temp 98.7 F (37.1 C) (Oral)   Resp (!) 21   Ht '5\' 10"'  (1.778 m)   Wt 53.5 kg   SpO2 97%   BMI 16.93 kg/m   Physical Exam Vitals and nursing note reviewed.  Constitutional:      Appearance: He is well-developed.  HENT:     Head: Normocephalic and atraumatic.     Mouth/Throat:     Mouth: Mucous membranes  are dry.  Eyes:     Conjunctiva/sclera: Conjunctivae normal.  Cardiovascular:     Rate and Rhythm: Regular rhythm. Tachycardia present.     Heart sounds: Normal heart sounds. No murmur heard.   Pulmonary:     Effort: Pulmonary effort is normal. No respiratory distress.     Breath sounds: Normal breath sounds. No wheezing, rhonchi or rales.  Abdominal:     General: Bowel sounds are normal.     Palpations: Abdomen is soft.     Tenderness: There is no abdominal tenderness. There is no guarding or rebound.  Musculoskeletal:     Cervical back: Neck supple.  Skin:    General: Skin is warm and dry.  Neurological:     Mental Status: He is alert.     Comments: Clear speech, no facial droop, moving all extremities     ED Results / Procedures / Treatments   Labs (all labs ordered are listed, but only abnormal results are displayed) Labs Reviewed  CBC WITH DIFFERENTIAL/PLATELET - Abnormal; Notable for the following components:      Result Value   WBC 12.2 (*)    Basophils Absolute 0.2 (*)    All other components within normal limits  RESP PANEL BY RT-PCR (FLU A&B, COVID) [id number]  BASIC METABOLIC PANEL  URINALYSIS, ROUTINE W REFLEX MICROSCOPIC  BLOOD GAS, VENOUS    EKG None  Radiology DG Chest Portable 1 View  Result Date: 03/05/2021 CLINICAL DATA:  Fatigue with increasing weakness over the last 2 weeks. Short of breath. History of diabetes, asthma, COPD. EXAM: PORTABLE CHEST 1 VIEW COMPARISON:  01/15/2015 FINDINGS: Low lung volumes. Heart size and pulmonary vascularity are normal. No airspace  disease or consolidation in the lungs. No pleural effusions. No pneumothorax. Mediastinal contours appear intact. Degenerative changes in the spine. IMPRESSION: No active disease. Electronically Signed   By: Lucienne Capers M.D.   On: 03/05/2021 20:34    Procedures Procedures   Medications Ordered in ED Medications  sodium chloride 0.9 % bolus 1,000 mL (1,000 mLs Intravenous New Bag/Given 03/05/21 2028)    ED Course  I have reviewed the triage vital signs and the nursing notes.  Pertinent labs & imaging results that were available during my care of the patient were reviewed by me and considered in my medical decision making (see chart for details).    MDM Rules/Calculators/A&P                          50 y/o M presents for eval of fatigue and generalized weakness.   At shift change, care transitioned to Alyse Low, PA-C at shift change. Plan is to f/u on labs, UA, cxr and covid test. If neg, pt likely safe for d/c home.   Final Clinical Impression(s) / ED Diagnoses Final diagnoses:  None    Rx / DC Orders ED Discharge Orders    None       [name] Dublin 03/05/21 2102    Isla Pence, MD 03/06/21 2243

## 2021-03-05 NOTE — ED Provider Notes (Signed)
[facility]    CSN: [id number] Arrival date & time: 03/05/21  1208      History   Chief Complaint No chief complaint on file.   HPI Joseph [name] is a 50 y.o. male.   HPI  Patient presented today for treatment of chronic insomnia, chronic GI bloating, and fatigue (chronic). Patient has PCP last visit on 3/25. PCP out of town offered an appointment for later part of next week. He is under medication contracts for pain management with neurology and has a current active prescription for Zolpidem.   Patient has diabetes with recent A1C 8.3, PCP increased medication on 3/25 Patient has not checked blood sugar today, however reading over the last few days have been as high 255 without any hypoglycemia present. He is taking Ambien and its not working to provide him sleep relief. Has opioid associated constipation and complains of bloating and gas. Last BM several days ago which is his normal. Drinks Epson salt when he gets to constipated which works best for him. He is overdue for his colonoscopy And has not scheduled a GI appointment per PCP recommended.    Past Medical History:  Diagnosis Date  . Acute encephalopathy 03/24/2018  . Anxiety   . Arthritis    oa left knee and lower back  . Asthma   . Cataract    hx of bilateral  . Chronic back pain   . Chronic pain of left knee   . COPD (chronic obstructive pulmonary disease) ([place])   . Diabetes mellitus    type 2  . Encephalopathy acute 03/24/2018  . GERD (gastroesophageal reflux disease)   . INGUINAL PAIN, LEFT 10/03/2009   Qualifier: Diagnosis of  By: Oneida Alar MD, Sandi L   . Oral thrush 11/10/2018  . Overdose    03-24-18 accidental  . Shortness of breath    with heavy exertion  . Sleep apnea     Patient Active Problem List   Diagnosis Date Noted  . Cervical lymphadenitis 11/27/2020  . Preventative health care 07/05/2020  . Leukocytosis 05/04/2020  . Chronic fatigue 05/03/2020  . Medicare annual wellness  visit, subsequent 06/28/2019  . Controlled substance agreement broken 09/14/2018  . Primary osteoarthritis of knee 07/21/2018  . Status post unicompartmental knee replacement, left 07/21/2018  . Medicare annual wellness visit, initial 05/20/2018  . Effusion of knee joint (Left) 04/21/2018  . Opiate overdose ([place]) 03/24/2018  . Arthropathy of knee (Left) 01/12/2018  . Opioid-induced constipation (OIC) 01/12/2018  . Tinea corporis 01/07/2018  . Tinea versicolor 01/07/2018  . Pharmacologic therapy 12/08/2017  . Problems influencing health status 12/08/2017  . Long term prescription opiate use 12/08/2017  . Opiate use 12/08/2017  . Insomnia 11/20/2017  . Disorder of skeletal system 11/05/2017  . Chronic knee pain (Primary Area of Pain) (Bilateral) (L>R) 11/05/2017  . Chronic wrist pain (Secondary Area of Pain) (Left) 11/05/2017  . Chronic constipation 08/18/2017  . Hyperlipidemia 05/12/2017  . COPD without exacerbation ([place]) 05/07/2017  . Male hypogonadism 04/24/2017  . Post-void dribbling 04/01/2017  . Osteoarthritis of the knee (Left)   . Rash and nonspecific skin eruption 03/12/2017  . GERD (gastroesophageal reflux disease) 03/12/2017  . Bucket handle tear of meniscus of knee (Right)   . Anxiety and depression 10/07/2015  . Morbid obesity ([place]) 10/06/2015  . OSA (obstructive sleep apnea) 08/31/2014  . Testosterone deficiency 07/01/2014  . S/P knee surgery 04/26/2014  . Meniscal tear of knee, sequela (Left) 04/26/2014  . Medial meniscus,  posterior horn derangement 04/22/2014  . Intrinsic asthma 12/19/2013  . Chronic pain syndrome 05/13/2013  . Type 2 diabetes mellitus ([place]) 05/13/2013  . Mediastinal abnormality 04/10/2012  . Cigarette smoker 04/10/2012    Past Surgical History:  Procedure Laterality Date  . CARPAL TUNNEL RELEASE Left   . CATARACT EXTRACTION W/PHACO Right 03/11/2016   Procedure: CATARACT EXTRACTION PHACO AND INTRAOCULAR LENS PLACEMENT (IOC);  Surgeon: Tonny Branch, MD;  Location: [facility];  Service: Ophthalmology;  Laterality: Right;  CDE 4.24  . EYE SURGERY Right 2018   ioc with lens replacement  . HERNIA REPAIR Bilateral 4174   umbilical  . KNEE ARTHROSCOPY WITH MEDIAL MENISECTOMY Left 04/22/2014   Procedure: LEFT KNEE ARTHROSCOPY WITH MEDIAL MENISECTOMY;  Surgeon: Carole Civil, MD;  Location: [facility];  Service: Orthopedics;  Laterality: Left;  . KNEE ARTHROSCOPY WITH MEDIAL MENISECTOMY Right 12/06/2015   Procedure: RIGHT KNEE ARTHROSCOPY WITH MEDIAL MENISECTOMY;  Surgeon: Carole Civil, MD;  Location: [facility];  Service: Orthopedics;  Laterality: Right;  . KNEE ARTHROSCOPY WITH MEDIAL MENISECTOMY Left 03/20/2017   Procedure: KNEE ARTHROSCOPY WITH MEDIAL MENISECTOMY;  Surgeon: Carole Civil, MD;  Location: [facility];  Service: Orthopedics;  Laterality: Left;  . ORIF WRIST FRACTURE Left 12/19/2016   Procedure: OPEN REDUCTION INTERNAL FIXATION (ORIF) WRIST FRACTURE;  Surgeon: Leanora Cover, MD;  Location: [facility];  Service: Orthopedics;  Laterality: Left;  accumed   . PARTIAL KNEE ARTHROPLASTY Left 07/21/2018   Procedure: LEFT UNICOMPARTMENTAL KNEE;  Surgeon: Renette Butters, MD;  Location: [facility];  Service: Orthopedics;  Laterality: Left;  . SHOULDER ARTHROCENTESIS Right 2008 or 2010  . VASECTOMY  2014       Home Medications    Prior to Admission medications   Medication Sig Start Date End Date Taking? Authorizing Provider  albuterol (PROVENTIL) (2.5 MG/3ML) 0.083% nebulizer solution USE 3 ML VIA NEBULIZER FOUR TIMES DAILY AS NEEDED FOR SHORTNESS OF BREATH 10/02/20   Tanda Rockers, MD  ANDROGEL PUMP 20.25 MG/ACT (1.62%) GEL Apply 2 Pump topically daily.  02/25/18   [provider]  atorvastatin (LIPITOR) 40 MG tablet TAKE ONE TABLET (40MG TOTAL) BY MOUTH DAILY FOR CHOLESTEROL 06/26/20   Pleas Koch, NP  blood glucose meter kit and supplies KIT Dispense based on patient and insurance preference. Use up to  four times daily as directed. (FOR ICD-9 250.00, 250.01). 06/28/19   Pleas Koch, NP  BREO ELLIPTA 200-25 MCG/INH AEPB INHALE 1 PUFF INTO THE LUNGS DAILY 11/17/20   Tanda Rockers, MD  buPROPion [facility] SR) 150 MG 12 hr tablet Take 1 tablet (150 mg total) by mouth 2 (two) times daily. 07/05/20   Pleas Koch, NP  cetirizine (ZYRTEC) 10 MG tablet TAKE ONE TABLET (10MG TOTAL) BY MOUTH ATBEDTIME FOR ALLERGIES 10/12/20   Pleas Koch, NP  citalopram (CELEXA) 40 MG tablet Take 1 tablet (40 mg total) by mouth daily. For anxiety and depression. 12/26/20   Pleas Koch, NP  Continuous Blood Gluc Sensor (FREESTYLE LIBRE 14 DAY SENSOR) MISC USE TO TEST BLOOD SUGAR 02/01/21   Pleas Koch, NP  cyclobenzaprine (FLEXERIL) 10 MG tablet Take 10 mg by mouth 2 (two) times daily as needed.    [provider]  fluticasone (FLONASE) 50 MCG/ACT nasal spray Place 2 sprays into both nostrils daily. 02/22/20   Jearld Fenton, NP  gabapentin (NEURONTIN) 300 MG capsule TAKE 2 CAPSULES BY MOUTH TWICE A DAY. 11/28/20  Jearld Fenton, NP  glipiZIDE (GLUCOTROL) 10 MG tablet TAKE ONE TABLET TWICE DAILY FOR DIABETES 03/22/20   Pleas Koch, NP  insulin degludec (TRESIBA) 100 UNIT/ML FlexTouch Pen Inject 30 Units into the skin daily. 02/23/21   Pleas Koch, NP  Insulin Pen Needle (PEN NEEDLES) 31G X 6 MM MISC Use nightly with insulin. 06/14/20   Pleas Koch, NP  Ipratropium-Albuterol (COMBIVENT) 20-100 MCG/ACT AERS respimat Inhale 1 puff into the lungs every 4 (four) hours as needed for wheezing. 01/04/20   Tanda Rockers, MD  JARDIANCE 10 MG TABS tablet TAKE ONE TABLET (10MG) BY MOUTH EVERY MORNING FOR DIABETES 09/25/20   Pleas Koch, NP  Lancets [facility] DELICA PLUS [id number]) MISC USE TO CHECK BLOOD SUGAR UP TO FOUR TIMES DAILY 09/14/19   Pleas Koch, NP  lansoprazole (PREVACID) 30 MG capsule Take 1 capsule (30 mg total) by mouth 2 (two) times daily before a  meal. 11/07/20   Pleas Koch, NP  metFORMIN (GLUCOPHAGE-XR) 500 MG 24 hr tablet Take 2 tablets (1,000 mg total) by mouth 2 (two) times daily with a meal. For diabetes. 12/11/20   Pleas Koch, NP  NUCYNTA ER 100 MG 12 hr tablet SMARTSIG:1 Tablet(s) By Mouth Every 12 Hours 02/13/21   [provider]  [facility] VERIO test strip USE TO CHECK BLOOD SUGAR UP TO FOUR TIMES DAILY 09/14/19   Pleas Koch, NP  tamsulosin (FLOMAX) 0.4 MG CAPS capsule TAKE ONE CAPSULE BY MOUTH DAILY 07/21/19   Pleas Koch, NP  Turmeric 500 MG CAPS Take 1 capsule by mouth daily.    [provider]  zolpidem (AMBIEN) 10 MG tablet TAKE ONE TABLET (10MG TOTAL) BY MOUTH ATBEDTIME AS NEEDED FOR SLEEP 01/17/21   Pleas Koch, NP    Family History Family History  Problem Relation Age of Onset  . Emphysema Maternal Grandfather   . Emphysema Maternal Grandmother   . Clotting disorder Maternal Grandmother     Social History Social History   Tobacco Use  . Smoking status: Current Every Day Smoker    Packs/day: 1.00    Years: 26.00    Pack years: 26.00    Types: Cigarettes    Start date: 12/17/1993  . Smokeless tobacco: Never Used  . Tobacco comment: 1 ppd 01/04/2020  Vaping Use  . Vaping Use: Some days  Substance Use Topics  . Alcohol use: Never    Alcohol/week: 0.0 standard drinks  . Drug use: Never     Allergies   Azithromycin, Clarithromycin, Erythromycin, Keflex [cephalexin], Metformin, and Symbicort [budesonide-formoterol fumarate]   Review of Systems Review of Systems Pertinent negatives listed in HPI  Physical Exam Triage Vital Signs ED Triage Vitals  Enc Vitals Group     BP 03/05/21 1216 138/79     Pulse Rate 03/05/21 1216 (!) 111     Resp 03/05/21 1216 18     Temp 03/05/21 1216 98.1 F (36.7 C)     Temp Source 03/05/21 1216 Oral     SpO2 03/05/21 1216 96 %     Weight --      Height --      Head Circumference --      Peak Flow --      Pain Score  03/05/21 1217 2     Pain Loc --      Pain Edu? --      Excl. in [place]? --    No data found.  Updated  Vital Signs BP 138/79 (BP Location: Right Arm)   Pulse (!) 111   Temp 98.1 F (36.7 C) (Oral)   Resp 18   SpO2 96%   Visual Acuity Right Eye Distance:   Left Eye Distance:   Bilateral Distance:    Right Eye Near:   Left Eye Near:    Bilateral Near:     Physical Exam - General appearance: Alert, obese, no distress  Head: Normocephalic, without obvious abnormality, atraumatic Abdominal: Distention present  Extremities: No gross deformities Skin: Skin color, texture, turgor normal.  Psych: Appropriate mood and affect  UC Treatments / Results  Labs (all labs ordered are listed, but only abnormal results are displayed) Labs Reviewed - No data to display  EKG   Radiology No results found.  Procedures Procedures (including critical care time)  Medications Ordered in UC Medications - No data to display  Initial Impression / Assessment and Plan / UC Course  I have reviewed the triage vital signs and the nursing notes.  Pertinent labs & imaging results that were available during my care of the patient were reviewed by me and considered in my medical decision making (see chart for details).    Unfortunately patient presents with multiple chronic complaints which are non-emergent and most appropriately addressed by PCP. Patient is prescribed multiple high risk, sedating medication for pain and sleep, and is closely followed by the pre scribers. It is inappropriate to address or interfere with current management of medication for sleep. Fatigue is likely multifactorial medications, weight, and poorly controlled diabetes, deferring management to PCP. Patient declined Miralax recommended for bloating. Counseled against the use of Epson salt, recommended Miralax.  Advised to follow-up and schedule a appointment with PCP.  Educated on the appropriate use of Emergency /Urgent Care  services.  Patient stable for discharge.  Final Clinical Impressions(s) / UC Diagnoses   Final diagnoses:  Fatigue, unspecified type  Insomnia, unspecified type  Abdominal bloating  Chronic constipation  Type 2 diabetes mellitus with hyperglycemia, with long-term current use of insulin [facility])   Discharge Instructions   None    ED Prescriptions    None     PDMP not reviewed this encounter.   Scot Jun, FNP 03/05/21 1453

## 2021-03-05 NOTE — Discharge Instructions (Addendum)
Return if any problems.  See your Physician for recheck  °

## 2021-03-05 NOTE — Telephone Encounter (Signed)
[facility] - Client TELEPHONE ADVICE RECORD [facility] Patient Name: Joseph [name] Gender: Male DOB: 13-Jul-1971 Age: 50 Y 10 M 5 D Return Phone Number: [phone] (Primary) Address: City/ State/ Zip: [place] [address] Client [facility] - Client Client Site [facility] - Day Physician Vernona Rieger - NP Contact Type Call Who Is Calling Patient / Member / Family / Caregiver Call Type Triage / Clinical Relationship To Patient Self Return Phone Number [phone] (Primary) Chief Complaint Headache Reason for Call Symptomatic / Request for Health Information Initial Comment Caller states he has Sx of fatigue, bloating/gas, muscle aches, headaches and sleep loss. Translation No Nurse Assessment Nurse: Lesly Rubenstein, RN, Crystal Date/Time (Eastern Time): 03/05/2021 1:08:22 PM Confirm and document reason for call. If symptomatic, describe symptoms. ---Caller states he has Sx of fatigue, bloating/gas, muscle aches, headaches and sleep loss. Caller states symptoms have been going on for about 2 weeks. Is constipated. No fever Does the patient have any new or worsening symptoms? ---Yes Will a triage be completed? ---Yes Related visit to physician within the last 2 weeks? ---Yes Does the PT have any chronic conditions? (i.e. diabetes, asthma, this includes High risk factors for pregnancy, etc.) ---Yes List chronic conditions. ---DM, asthma, anxiety, Is this a behavioral health or substance abuse call? ---No Guidelines Guideline Title Affirmed Question Affirmed Notes Nurse Date/Time (Eastern Time) Mouth Symptoms [1] White patches that stick to tongue or inner cheek AND [2] can be wiped off Parrott, RN, Crystal 03/05/2021 1:16:30 PM Disp. Time Lamount Cohen Time) Disposition Final User 03/05/2021 1:18:25 PM SEE PCP WITHIN 3 DAYS Yes Parrott, RN, Crystal PLEASE NOTE: All timestamps contained  within this report are represented as [place] Standard Time. CONFIDENTIALTY NOTICE: This fax transmission is intended only for the addressee. It contains information that is legally privileged, confidential or otherwise protected from use or disclosure. If you are not the intended recipient, you are strictly prohibited from reviewing, disclosing, copying using or disseminating any of this information or taking any action in reliance on or regarding this information. If you have received this fax in error, please notify us immediately by telephone so that we can arrange for its return to [place]. Phone: [phone], Toll-Free: [phone], Fax: [phone] Page: 2 of 4 Call Id: [id number] Caller Disagree/Comply Comply Caller Understands Yes PreDisposition Call Doctor Care Advice Given Per Guideline SEE PCP WITHIN 3 DAYS: * You need to be seen within 2 or 3 days. * PCP VISIT: Call your doctor (or NP/PA) during regular office hours and make an appointment. A clinic or urgent care center are good places to go for care if your doctor's office is closed or you can't get an appointment. NOTE: If office will be open tomorrow, tell caller to call then, not in 3 days. CARE ADVICE given per Mouth Symptoms (Adult) guideline. * You become worse * Difficulty with breathing occurs * Difficulty with swallowing occurs (e.g., can't swallow or drooling) CALL BACK IF: Comments User: Floyce Stakes, RN Date/Time (Eastern Time): 03/05/2021 1:33:19 PM Spoke with scheduler and triage. On call with office for 15 mins Triage Details User: Lesly Rubenstein, RN, Crystal Date/Time: 03/05/2021 1:16:30 PM Triage Guideline: Weakness (Generalized) and Fatigue Triage Guideline: COVID-19 - Diagnosed or Suspected Triage Guideline: Mouth Symptoms SEVERE difficulty breathing (e.g., struggling for each breath, speaks in single words, stridor) -----No Sounds like a life-threatening emergency to the triager -----No Injury to tooth or  teeth -----No Injury to mouth -----No  Canker sore suspected (i.e., aphthous ulcer) in the mouth -----No Cold sore suspected (i.e., fever blister sore) on the outer lip -----No Tooth is painful or swelling around a tooth -----No Mouth is painful PLEASE NOTE: All timestamps contained within this report are represented as [place] Standard Time. CONFIDENTIALTY NOTICE: This fax transmission is intended only for the addressee. It contains information that is legally privileged, confidential or otherwise protected from use or disclosure. If you are not the intended recipient, you are strictly prohibited from reviewing, disclosing, copying using or disseminating any of this information or taking any action in reliance on or regarding this information. If you have received this fax in error, please notify us immediately by telephone so that we can arrange for its return to [place]. Phone: [phone], Toll-Free: [phone], Fax: [phone] Page: 3 of 4 Call Id: [id number] Triage Details -----No Throat is painful -----No Tongue swelling -----No Lip swelling -----No [1] Drooling or spitting out saliva (because can't swallow) AND [2] new-onset -----No [1] Bleeding from mouth AND [2] won't stop after 10 minutes of direct pressure -----No Electrical burn of mouth -----No Chemical burn of mouth -----No [1] Difficulty breathing AND [2] not severe -----No Patient sounds very sick or weak to the triager -----No [1] Gum bleeding AND [2] taking Coumadin (warfarin) or other strong blood thinner, or known bleeding disorder (e.g., thrombocytopenia) -----No [1] Dry mouth AND [2] urinating more frequently than usual (i.e., frequency) -----No [1] Dry mouth AND [2] drinking more liquids than usual (thirsty) AND [3] > 1 day (24 hours) -----No Receiving chemotherapy or radiation therapy -----No [1] White patches that stick to tongue or inner cheek AND [2] can be wiped off -----Yes Disposition: SEE  PCP WITHIN 3 DAYS SEE PCP WITHIN 3 DAYS: * You need to be seen within 2 or 3 days. PLEASE NOTE: All timestamps contained within this report are represented as [place] Standard Time. CONFIDENTIALTY NOTICE: This fax transmission is intended only for the addressee. It contains information that is legally privileged, confidential or otherwise protected from use or disclosure. If you are not the intended recipient, you are strictly prohibited from reviewing, disclosing, copying using or disseminating any of this information or taking any action in reliance on or regarding this information. If you have received this fax in error, please notify us immediately by telephone so that we can arrange for its return to [place]. Phone: [phone], Toll-Free: [phone], Fax: [phone] Page: 4 of 4 Call Id: [id number] Triage Details * PCP VISIT: Call your doctor (or NP/PA) during regular office hours and make an appointment. A clinic or urgent care center are good places to go for care if your doctor's office is closed or you can't get an appointment. NOTE: If office will be open tomorrow, tell caller to call then, not in 3 days. CARE ADVICE given per Mouth Symptoms (Adult) guideline. * You become worse * Difficulty with breathing occurs * Difficulty with swallowing occurs (e.g., can't swallow or drooling) CALL BACK IF:

## 2021-03-06 ENCOUNTER — Ambulatory Visit: Payer: Medicare HMO | Admitting: Internal Medicine

## 2021-03-14 ENCOUNTER — Ambulatory Visit: Payer: Medicare HMO | Admitting: Family Medicine

## 2021-03-19 ENCOUNTER — Other Ambulatory Visit: Payer: Self-pay

## 2021-03-19 ENCOUNTER — Other Ambulatory Visit: Payer: Self-pay | Admitting: Internal Medicine

## 2021-03-19 DIAGNOSIS — M25561 Pain in right knee: Secondary | ICD-10-CM

## 2021-03-19 DIAGNOSIS — K219 Gastro-esophageal reflux disease without esophagitis: Secondary | ICD-10-CM

## 2021-03-19 DIAGNOSIS — F419 Anxiety disorder, unspecified: Secondary | ICD-10-CM

## 2021-03-19 DIAGNOSIS — G8929 Other chronic pain: Secondary | ICD-10-CM

## 2021-03-19 DIAGNOSIS — F32A Depression, unspecified: Secondary | ICD-10-CM

## 2021-03-19 DIAGNOSIS — G4700 Insomnia, unspecified: Secondary | ICD-10-CM

## 2021-03-19 DIAGNOSIS — E119 Type 2 diabetes mellitus without complications: Secondary | ICD-10-CM

## 2021-03-19 MED ORDER — BUPROPION HCL ER (SR) 150 MG PO TB12: 150.0000 mg | ORAL_TABLET | Freq: Two times a day (BID) | ORAL | 0 refills | Status: DC

## 2021-03-19 MED ORDER — EMPAGLIFLOZIN 10 MG PO TABS
ORAL_TABLET | ORAL | 0 refills | Status: DC
Start: 2021-03-19 — End: 2021-03-27

## 2021-03-19 MED ORDER — INSULIN DEGLUDEC 100 UNIT/ML ~~LOC~~ SOPN: 30.0000 [IU] | PEN_INJECTOR | Freq: Every day | SUBCUTANEOUS | 5 refills | Status: DC

## 2021-03-19 MED ORDER — CITALOPRAM HYDROBROMIDE 40 MG PO TABS: 40.0000 mg | ORAL_TABLET | Freq: Every day | ORAL | 0 refills | Status: DC

## 2021-03-19 MED ORDER — GLIPIZIDE 10 MG PO TABS: ORAL_TABLET | ORAL | 0 refills | Status: DC

## 2021-03-19 MED ORDER — LANSOPRAZOLE 30 MG PO CPDR: 30.0000 mg | DELAYED_RELEASE_CAPSULE | Freq: Two times a day (BID) | ORAL | 0 refills | Status: DC

## 2021-03-19 MED ORDER — ALBUTEROL SULFATE (2.5 MG/3ML) 0.083% IN NEBU: INHALATION_SOLUTION | RESPIRATORY_TRACT | 5 refills | Status: DC

## 2021-03-19 NOTE — Telephone Encounter (Signed)
LAST APPOINTMENT DATE: 3/58/2022 (DM)  Last visit for insomnia 07/05/2020  NEXT APPOINTMENT DATE: 07/26/2021    LAST REFILL: 01/17/2021  QTY: 90

## 2021-03-20 ENCOUNTER — Other Ambulatory Visit: Payer: Self-pay | Admitting: Internal Medicine

## 2021-03-20 MED ORDER — IPRATROPIUM-ALBUTEROL 20-100 MCG/ACT IN AERS: 1.0000 | INHALATION_SPRAY | RESPIRATORY_TRACT | 3 refills | Status: DC | PRN

## 2021-03-20 MED ORDER — BREO ELLIPTA 200-25 MCG/INH IN AEPB: 1.0000 | INHALATION_SPRAY | Freq: Every day | RESPIRATORY_TRACT | 3 refills | Status: DC

## 2021-03-22 ENCOUNTER — Telehealth: Payer: Self-pay

## 2021-03-22 NOTE — Telephone Encounter (Signed)
Patient was called to verify if he was still taking metformin. Refill requests were received from [facility] pharmacy. Patient states he wont be using [facility] now and does not need any rxs sent there. He will be using another pharmacy but does not know where yet. I advised patient to call back for refills once he knows where he need them sent to.

## 2021-03-23 ENCOUNTER — Telehealth: Payer: Self-pay | Admitting: Primary Care

## 2021-03-23 NOTE — Telephone Encounter (Signed)
Prescription went to [facility]

## 2021-03-24 ENCOUNTER — Other Ambulatory Visit: Payer: Self-pay | Admitting: Primary Care

## 2021-03-24 DIAGNOSIS — E119 Type 2 diabetes mellitus without complications: Secondary | ICD-10-CM

## 2021-03-27 ENCOUNTER — Other Ambulatory Visit: Payer: Self-pay

## 2021-03-27 ENCOUNTER — Other Ambulatory Visit: Payer: Self-pay | Admitting: Primary Care

## 2021-03-27 DIAGNOSIS — E785 Hyperlipidemia, unspecified: Secondary | ICD-10-CM

## 2021-03-27 DIAGNOSIS — E119 Type 2 diabetes mellitus without complications: Secondary | ICD-10-CM

## 2021-03-27 MED ORDER — PEN NEEDLES 31G X 6 MM MISC: 5 refills | Status: DC

## 2021-03-27 MED ORDER — ATORVASTATIN CALCIUM 40 MG PO TABS: ORAL_TABLET | ORAL | 3 refills | Status: DC

## 2021-03-27 MED ORDER — METFORMIN HCL ER 500 MG PO TB24: 1000.0000 mg | ORAL_TABLET | Freq: Two times a day (BID) | ORAL | 3 refills | Status: DC

## 2021-03-28 ENCOUNTER — Other Ambulatory Visit: Payer: Self-pay

## 2021-03-28 DIAGNOSIS — J302 Other seasonal allergic rhinitis: Secondary | ICD-10-CM

## 2021-03-28 DIAGNOSIS — F419 Anxiety disorder, unspecified: Secondary | ICD-10-CM

## 2021-03-28 DIAGNOSIS — E119 Type 2 diabetes mellitus without complications: Secondary | ICD-10-CM

## 2021-03-28 MED ORDER — BUPROPION HCL ER (SR) 150 MG PO TB12: 150.0000 mg | ORAL_TABLET | Freq: Two times a day (BID) | ORAL | 0 refills | Status: DC

## 2021-03-28 MED ORDER — GLIPIZIDE 10 MG PO TABS
ORAL_TABLET | ORAL | 3 refills | Status: DC
Start: 2021-03-28 — End: 2022-01-04

## 2021-03-28 MED ORDER — CETIRIZINE HCL 10 MG PO TABS
ORAL_TABLET | ORAL | 1 refills | Status: DC
Start: 2021-03-28 — End: 2021-09-23

## 2021-03-28 NOTE — Telephone Encounter (Signed)
  LAST APPOINTMENT DATE: 03/22/2021   NEXT APPOINTMENT DATE:@4 /23/2022  MEDICATION: wellbutrin, glipizide, zyrtec  PHARM[facility]  Let patient know to contact pharmacy at the end of the day to make sure medication is ready.  Please notify patient to allow 48-72 hours to process  Encourage patient to contact the pharmacy for refills or they can request refills through [facility]  CLINICAL FILLS OUT ALL BELOW:   LAST REFILL:  QTY:  REFILL DATE:    OTHER COMMENTS:    Okay for refill?  Please advise

## 2021-03-28 NOTE — Telephone Encounter (Signed)
Called patient verified that we need to resend to [facility]. Due to insurance change can not use [facility] per patient. Have resent in refills.

## 2021-03-29 MED ORDER — BREO ELLIPTA 200-25 MCG/INH IN AEPB: 1.0000 | INHALATION_SPRAY | Freq: Every day | RESPIRATORY_TRACT | 0 refills | Status: DC

## 2021-03-29 NOTE — Telephone Encounter (Signed)
Placed a sample of Breo at the front desk for a patient.   Started PA via [email or web address]. KeySalley Slaughter. Will check back for updates.

## 2021-04-02 NOTE — Telephone Encounter (Signed)
Piper Goughnour (Key: [id number])  Your information has been submitted to [employer]. [employer] is reviewing the PA request and you will receive an electronic response. You may check for the updated outcome later by reopening this request. The standard fax determination will also be sent to you directly.  If you have any questions about your PA submission, contact [employer] at [phone].  Submitted today hold for response

## 2021-04-06 DIAGNOSIS — M545 Low back pain, unspecified: Secondary | ICD-10-CM | POA: Diagnosis not present

## 2021-04-06 DIAGNOSIS — Z79891 Long term (current) use of opiate analgesic: Secondary | ICD-10-CM | POA: Diagnosis not present

## 2021-04-06 DIAGNOSIS — M79642 Pain in left hand: Secondary | ICD-10-CM | POA: Diagnosis not present

## 2021-04-06 DIAGNOSIS — M5412 Radiculopathy, cervical region: Secondary | ICD-10-CM | POA: Diagnosis not present

## 2021-04-06 DIAGNOSIS — K5903 Drug induced constipation: Secondary | ICD-10-CM | POA: Diagnosis not present

## 2021-04-06 DIAGNOSIS — M452 Ankylosing spondylitis of cervical region: Secondary | ICD-10-CM | POA: Diagnosis not present

## 2021-04-06 DIAGNOSIS — M542 Cervicalgia: Secondary | ICD-10-CM | POA: Diagnosis not present

## 2021-04-06 DIAGNOSIS — M25562 Pain in left knee: Secondary | ICD-10-CM | POA: Diagnosis not present

## 2021-04-09 NOTE — Telephone Encounter (Signed)
Received fax from [facility]- "Joseph [name] does not require PA at this time and med may be covered at the pharmacy in accordance with your benefit plan". Emailed pt to let him know.

## 2021-04-10 ENCOUNTER — Telehealth: Payer: Self-pay

## 2021-04-10 NOTE — Chronic Care Management (AMB) (Addendum)
Chronic Care Management Pharmacy Assistant   Name: Joseph [name]  MRN: [id number] DOB: [birth year]   Reason for Encounter: Disease State DM  Recent office visits:  02/23/2021 - [name]  NP - Increased Insulin [name] to 30 units daily  02/13/2021 - [name] NP  - No medication changes   Recent consult visits:  01/16/2021 - [name], Neurology - No medication changes   Hospital visits:  03/05/2021 - ED - Hyperglycemia, resume home medications  Medications: Outpatient Encounter Medications as of 04/10/2021  Medication Sig   albuterol (PROVENTIL) (2.5 MG/3ML) 0.083% nebulizer solution USE 3 ML VIA NEBULIZER FOUR TIMES DAILY AS NEEDED FOR SHORTNESS OF BREATH   ANDROGEL PUMP 20.25 MG/ACT (1.62%) GEL Apply 2 Pump topically daily.    atorvastatin (LIPITOR) 40 MG tablet TAKE ONE TABLET (40MG TOTAL) BY MOUTH DAILY FOR CHOLESTEROL   blood glucose meter kit and supplies KIT Dispense based on patient and insurance preference. Use up to four times daily as directed. (FOR ICD-9 250.00, 250.01).   buPROPion (WELLBUTRIN SR) 150 MG 12 hr tablet Take 1 tablet (150 mg total) by mouth 2 (two) times daily.   cetirizine (ZYRTEC) 10 MG tablet TAKE ONE TABLET (10MG TOTAL) BY MOUTH ATBEDTIME FOR ALLERGIES   citalopram (CELEXA) 40 MG tablet Take 1 tablet (40 mg total) by mouth daily. For anxiety and depression.   Continuous Blood Gluc Sensor (FREESTYLE LIBRE 14 DAY SENSOR) MISC USE TO TEST BLOOD SUGAR   cyclobenzaprine (FLEXERIL) 10 MG tablet Take 10 mg by mouth 2 (two) times daily as needed.   fluticasone (FLONASE) 50 MCG/ACT nasal spray Place 2 sprays into both nostrils daily.   fluticasone furoate-vilanterol (BREO ELLIPTA) 200-25 MCG/INH AEPB Inhale 1 puff into the lungs daily.   fluticasone furoate-vilanterol (BREO ELLIPTA) 200-25 MCG/INH AEPB Inhale 1 puff into the lungs daily.   gabapentin (NEURONTIN) 300 MG capsule TAKE 2 CAPSULES BY MOUTH TWICE A DAY.   glipiZIDE (GLUCOTROL) 10 MG  tablet Take one tablet daily for diabetes.   insulin degludec (TRESIBA) 100 UNIT/ML FlexTouch Pen Inject 30 Units into the skin daily.   Insulin Pen Needle (PEN NEEDLES) 31G X 6 MM MISC Use nightly with insulin.   Ipratropium-Albuterol (COMBIVENT) 20-100 MCG/ACT AERS respimat Inhale 1 puff into the lungs every 4 (four) hours as needed for wheezing.   JARDIANCE 10 MG TABS tablet TAKE ONE TABLET (10MG) BY MOUTH EVERY MORNING FOR DIABETES   Lancets (ONETOUCH DELICA PLUS [id number]) MISC USE TO CHECK BLOOD SUGAR UP TO FOUR TIMES DAILY   lansoprazole (PREVACID) 30 MG capsule Take 1 capsule (30 mg total) by mouth 2 (two) times daily before a meal.   metFORMIN (GLUCOPHAGE-XR) 500 MG 24 hr tablet Take 2 tablets (1,000 mg total) by mouth 2 (two) times daily with a meal. For diabetes.   NUCYNTA ER 100 MG 12 hr tablet SMARTSIG:1 Tablet(s) By Mouth Every 12 Hours   ONETOUCH VERIO test strip USE TO CHECK BLOOD SUGAR UP TO FOUR TIMES DAILY   tamsulosin (FLOMAX) 0.4 MG CAPS capsule TAKE ONE CAPSULE BY MOUTH DAILY   Turmeric 500 MG CAPS Take 1 capsule by mouth daily.   zolpidem (AMBIEN) 10 MG tablet TAKE ONE TABLET (10MG TOTAL) BY MOUTH ATBEDTIME AS NEEDED FOR SLEEP   No facility-administered encounter medications on file as of 04/10/2021.    Recent Relevant Labs: Lab Results  Component Value Date/Time   HGBA1C 8.3 (A) 02/23/2021 08:23 AM   HGBA1C 9.6 (A) 05/03/2020 02:47 PM  HGBA1C 10.8 (H) 08/23/2019 08:57 AM   HGBA1C 9.9 (H) 05/21/2019 09:13 AM   MICROALBUR <0.7 05/03/2020 02:49 PM   MICROALBUR <0.7 05/21/2019 09:13 AM    Kidney Function Lab Results  Component Value Date/Time   CREATININE 0.94 03/05/2021 08:22 PM   CREATININE 0.94 05/03/2020 02:54 PM   GFR 84.95 05/03/2020 02:54 PM   GFRNONAA >60 03/05/2021 08:22 PM   GFRAA >60 07/13/2018 03:04 PM   Multiple attempts 04/10/21, 04/18/21, 04/26/21 to reach out to the patient unsuccessful.    Current antihyperglycemic regimen:  Metformin XR 500 mg   - 2 tablets twice daily Jardiance 10 mg - 1 tablet daily Glipizide 10 mg - 1 tablet daily Tresiba - Inject 30 units daily Freestyle Libre 1  What recent interventions/DTPs have been made to improve glycemic control:   Increase Tresiba to 30 units; [name] recommend re-trial of Ozempic (pt had GI upset initially)  Have there been any recent hospitalizations or ED visits since last visit with CPP? Yes - visited the ED due to hyperglycemia   Adherence Review: Is the patient currently on a STATIN medication? Yes Is the patient currently on ACE/ARB medication? No Does the patient have >5 day gap between last estimated fill dates? Yes - past due   Star Rating Drugs:  Medication:  Last Fill: Day Supply Atorvastatin 91m 01/23/2021 90ds Glipizide 139m4/25/2022 90ds Jardiance 1044m/26/2022 90ds Metformin 500m95m25/2022 90ds  Follow-Up:  Pharmacist Review  MichDebbora DusP notified  VelmAvel SensorASteamboat Surgery Centernical Pharmacy Assistant [phone] reviewed the care management and care coordination activities outlined in this encounter and I am certifying that I agree with the content of this note. CCM appt scheduled for mid-June to follow up with patient.  MichDebbora DusarmD Clinical Pharmacist [phone]

## 2021-04-23 ENCOUNTER — Other Ambulatory Visit: Payer: Self-pay | Admitting: Primary Care

## 2021-04-23 DIAGNOSIS — F32A Depression, unspecified: Secondary | ICD-10-CM

## 2021-04-23 DIAGNOSIS — F419 Anxiety disorder, unspecified: Secondary | ICD-10-CM

## 2021-04-25 DIAGNOSIS — M1711 Unilateral primary osteoarthritis, right knee: Secondary | ICD-10-CM | POA: Diagnosis not present

## 2021-04-27 ENCOUNTER — Other Ambulatory Visit: Payer: Self-pay | Admitting: Primary Care

## 2021-04-27 DIAGNOSIS — F32A Depression, unspecified: Secondary | ICD-10-CM

## 2021-04-27 DIAGNOSIS — F419 Anxiety disorder, unspecified: Secondary | ICD-10-CM

## 2021-05-01 ENCOUNTER — Other Ambulatory Visit: Payer: Self-pay | Admitting: Primary Care

## 2021-05-01 DIAGNOSIS — G8929 Other chronic pain: Secondary | ICD-10-CM

## 2021-05-01 DIAGNOSIS — G4700 Insomnia, unspecified: Secondary | ICD-10-CM

## 2021-05-03 ENCOUNTER — Other Ambulatory Visit: Payer: Self-pay | Admitting: Primary Care

## 2021-05-03 DIAGNOSIS — K219 Gastro-esophageal reflux disease without esophagitis: Secondary | ICD-10-CM

## 2021-05-04 DIAGNOSIS — M542 Cervicalgia: Secondary | ICD-10-CM | POA: Diagnosis not present

## 2021-05-04 DIAGNOSIS — M25562 Pain in left knee: Secondary | ICD-10-CM | POA: Diagnosis not present

## 2021-05-04 DIAGNOSIS — M452 Ankylosing spondylitis of cervical region: Secondary | ICD-10-CM | POA: Diagnosis not present

## 2021-05-04 DIAGNOSIS — M79642 Pain in left hand: Secondary | ICD-10-CM | POA: Diagnosis not present

## 2021-05-04 DIAGNOSIS — M545 Low back pain, unspecified: Secondary | ICD-10-CM | POA: Diagnosis not present

## 2021-05-04 DIAGNOSIS — Z79891 Long term (current) use of opiate analgesic: Secondary | ICD-10-CM | POA: Diagnosis not present

## 2021-05-04 DIAGNOSIS — K5903 Drug induced constipation: Secondary | ICD-10-CM | POA: Diagnosis not present

## 2021-05-04 DIAGNOSIS — M5412 Radiculopathy, cervical region: Secondary | ICD-10-CM | POA: Diagnosis not present

## 2021-05-04 NOTE — Telephone Encounter (Signed)
Joseph [name], will you remove ALL pharmacies from his list except for [place] for which he is going to use? I couldn't do this.

## 2021-05-10 DIAGNOSIS — J449 Chronic obstructive pulmonary disease, unspecified: Secondary | ICD-10-CM | POA: Diagnosis not present

## 2021-05-10 DIAGNOSIS — G4733 Obstructive sleep apnea (adult) (pediatric): Secondary | ICD-10-CM | POA: Diagnosis not present

## 2021-05-17 ENCOUNTER — Telehealth: Payer: Self-pay

## 2021-05-17 ENCOUNTER — Telehealth: Payer: Medicare HMO

## 2021-05-17 NOTE — Chronic Care Management (AMB) (Addendum)
Chronic Care Management Pharmacy Assistant   Name: Joseph [name]  MRN: [id number] DOB: 09/06/1971  Reason for Encounter: Disease State- Diabetes   Recent office visits:  None since last CCM outreach  Recent consult visits:  None since last CCM outreach.   Hospital visits:  03/05/21- ED visit- Hyperglycemia. No admission.   Medications: Outpatient Encounter Medications as of 05/17/2021  Medication Sig   albuterol (PROVENTIL) (2.5 MG/3ML) 0.083% nebulizer solution USE 3 ML VIA NEBULIZER FOUR TIMES DAILY AS NEEDED FOR SHORTNESS OF BREATH   ANDROGEL PUMP 20.25 MG/ACT (1.62%) GEL Apply 2 Pump topically daily.    atorvastatin (LIPITOR) 40 MG tablet TAKE ONE TABLET (40MG TOTAL) BY MOUTH DAILY FOR CHOLESTEROL   blood glucose meter kit and supplies KIT Dispense based on patient and insurance preference. Use up to four times daily as directed. (FOR ICD-9 250.00, 250.01).   buPROPion (WELLBUTRIN SR) 150 MG 12 hr tablet Take 1 tablet (150 mg total) by mouth twice daily.   cetirizine (ZYRTEC) 10 MG tablet TAKE ONE TABLET (10MG TOTAL) BY MOUTH ATBEDTIME FOR ALLERGIES   citalopram (CELEXA) 40 MG tablet Take 1 tablet (40 mg total) by mouth daily. For anxiety and depression.   Continuous Blood Gluc Sensor (FREESTYLE LIBRE 14 DAY SENSOR) MISC USE TO TEST BLOOD SUGAR   cyclobenzaprine (FLEXERIL) 10 MG tablet Take 10 mg by mouth 2 (two) times daily as needed.   fluticasone (FLONASE) 50 MCG/ACT nasal spray Place 2 sprays into both nostrils daily.   fluticasone furoate-vilanterol (BREO ELLIPTA) 200-25 MCG/INH AEPB Inhale 1 puff into the lungs daily.   fluticasone furoate-vilanterol (BREO ELLIPTA) 200-25 MCG/INH AEPB Inhale 1 puff into the lungs daily.   gabapentin (NEURONTIN) 300 MG capsule TAKE 2 CAPSULES BY MOUTH TWICE A DAY.   glipiZIDE (GLUCOTROL) 10 MG tablet Take one tablet daily for diabetes.   insulin degludec (TRESIBA) 100 UNIT/ML FlexTouch Pen Inject 30 Units into the skin daily.   Insulin  Pen Needle (PEN NEEDLES) 31G X 6 MM MISC Use nightly with insulin.   Ipratropium-Albuterol (COMBIVENT) 20-100 MCG/ACT AERS respimat Inhale 1 puff into the lungs every 4 (four) hours as needed for wheezing.   JARDIANCE 10 MG TABS tablet TAKE ONE TABLET (10MG) BY MOUTH EVERY MORNING FOR DIABETES   Lancets (ONETOUCH DELICA PLUS [id number]) MISC USE TO CHECK BLOOD SUGAR UP TO FOUR TIMES DAILY   lansoprazole (PREVACID) 30 MG capsule Take 1 capsule (30 mg total) by mouth 2 (two) times daily before a meal. For heartburn   metFORMIN (GLUCOPHAGE-XR) 500 MG 24 hr tablet Take 2 tablets (1,000 mg total) by mouth 2 (two) times daily with a meal. For diabetes.   NUCYNTA ER 100 MG 12 hr tablet SMARTSIG:1 Tablet(s) By Mouth Every 12 Hours   ONETOUCH VERIO test strip USE TO CHECK BLOOD SUGAR UP TO FOUR TIMES DAILY   tamsulosin (FLOMAX) 0.4 MG CAPS capsule TAKE ONE CAPSULE BY MOUTH DAILY   Turmeric 500 MG CAPS Take 1 capsule by mouth daily.   zolpidem (AMBIEN) 10 MG tablet TAKE ONE TABLET (10MG TOTAL) BY MOUTH ATBEDTIME AS NEEDED FOR SLEEP   No facility-administered encounter medications on file as of 05/17/2021.     Recent Relevant Labs: Lab Results  Component Value Date/Time   HGBA1C 8.3 (A) 02/23/2021 08:23 AM   HGBA1C 9.6 (A) 05/03/2020 02:47 PM   HGBA1C 10.8 (H) 08/23/2019 08:57 AM   HGBA1C 9.9 (H) 05/21/2019 09:13 AM   MICROALBUR <0.7 05/03/2020 02:49 PM  MICROALBUR <0.7 05/21/2019 09:13 AM    Kidney Function Lab Results  Component Value Date/Time   CREATININE 0.94 03/05/2021 08:22 PM   CREATININE 0.94 05/03/2020 02:54 PM   GFR 84.95 05/03/2020 02:54 PM   GFRNONAA >60 03/05/2021 08:22 PM   GFRAA >60 07/13/2018 03:04 PM     Current antihyperglycemic regimen:  Metformin XR 500 mg  - 2 tablets twice daily Jardiance 10 mg - 1 tablet daily Glipizide 10 mg - 1 tablet daily Tresiba - Inject 30 units daily   Patient verbally confirms he is taking the above medications as directed. Yes  What  diet changes have been made to improve diabetes control? No changes to his diet.   What recent interventions/DTPs have been made to improve glycemic control:  No recent interventions  Have there been any recent hospitalizations or ED visits since last visit with CPP? No  Patient denies hypoglycemic symptoms, including Pale, Sweaty, Shaky, Hungry, Nervous/irritable, and Vision changes  Patient denies hyperglycemic symptoms, including blurry vision, excessive thirst, fatigue, polyuria, and weakness  How often are you checking your blood sugar? Not checking at this time. States he has been out of town for the last few weeks and does not have a sensor. He will call back once he has some readings.   What are your blood sugars ranging? N/A  On insulin? Yes How many units: Tresiba, 30 units daily  During the week, how often does your blood glucose drop below 70?  States he does not feel like BG has dropped below 70.  Are you checking your feet daily/regularly? Yes  Adherence Review: Is the patient currently on a STATIN medication? Yes Is the patient currently on ACE/ARB medication? No Does the patient have >5 day gap between last estimated fill dates? Yes - Patient did not have bottles to review last fill dates.  Last annual wellness visit: 07/05/20  Star Rating Drugs:  Medication:  Last Fill: Day Supply Glipizide 10 mg 03/26/21 30 Jardiance 10 mg 04/11/21 30 Metformin 500 mg 12/26/20 90 Atorvastatin 40 mg 01/03/21  90  PCP appointment on 07/26/21 for physical  Follow-Up:  Pharmacist Review  Debbora Dus, CPP notified  Margaretmary Dys, [place] Assistant [phone]  I have reviewed the care management and care coordination activities outlined in this encounter and I am certifying that I agree with the content of this note. No further action required.  Debbora Dus, PharmD Clinical Pharmacist [facility] at [facility] [phone]

## 2021-05-25 ENCOUNTER — Other Ambulatory Visit: Payer: Self-pay | Admitting: Primary Care

## 2021-05-25 DIAGNOSIS — F419 Anxiety disorder, unspecified: Secondary | ICD-10-CM

## 2021-05-25 DIAGNOSIS — F32A Depression, unspecified: Secondary | ICD-10-CM

## 2021-06-05 ENCOUNTER — Other Ambulatory Visit: Payer: Self-pay | Admitting: Internal Medicine

## 2021-06-05 DIAGNOSIS — M25562 Pain in left knee: Secondary | ICD-10-CM

## 2021-06-05 DIAGNOSIS — G894 Chronic pain syndrome: Secondary | ICD-10-CM

## 2021-06-13 ENCOUNTER — Other Ambulatory Visit: Payer: Self-pay | Admitting: Internal Medicine

## 2021-06-13 MED ORDER — FLUTICASONE FUROATE-VILANTEROL 200-25 MCG/INH IN AEPB: 1.0000 | INHALATION_SPRAY | Freq: Every day | RESPIRATORY_TRACT | 3 refills | Status: DC

## 2021-06-25 ENCOUNTER — Other Ambulatory Visit: Payer: Self-pay | Admitting: Internal Medicine

## 2021-06-25 ENCOUNTER — Other Ambulatory Visit: Payer: Self-pay | Admitting: Primary Care

## 2021-06-25 DIAGNOSIS — E119 Type 2 diabetes mellitus without complications: Secondary | ICD-10-CM

## 2021-07-16 ENCOUNTER — Telehealth: Payer: Self-pay | Admitting: *Deleted

## 2021-07-16 NOTE — Telephone Encounter (Signed)
Tammy pharmacist at [facility] called stating that they had transferred patient's Atorvastatin to a mail order pharmacy. Tammy stated that patient is no longer using the mail order pharmacy and would like to get a new script. Advised Tammy that a script was sent to [facility]/[place], [place] on 03/27/21 #90/3. Tammy stated that she will call [facility] to get that script transferred to them.

## 2021-07-23 ENCOUNTER — Telehealth: Payer: Self-pay

## 2021-07-23 NOTE — Chronic Care Management (AMB) (Addendum)
Chronic Care Management Pharmacy Assistant   Name: Joseph [name]  MRN: [id number] DOB: 1971/11/12  Reason for Encounter: Diabetes  Recent office visits:  07/16/21 - Family Medicine - phone call from the patient regarding medication refill.  Recent consult visits:  None since last CCM visit  Hospital visits:  None in previous 6 months  Medications: Outpatient Encounter Medications as of 07/23/2021  Medication Sig   gabapentin (NEURONTIN) 300 MG capsule TAKE 2 CAPSULES BY MOUTH TWICE A DAY.   albuterol (PROVENTIL) (2.5 MG/3ML) 0.083% nebulizer solution USE 3 ML VIA NEBULIZER FOUR TIMES DAILY AS NEEDED FOR SHORTNESS OF BREATH   ANDROGEL PUMP 20.25 MG/ACT (1.62%) GEL Apply 2 Pump topically daily.    atorvastatin (LIPITOR) 40 MG tablet TAKE ONE TABLET (40MG TOTAL) BY MOUTH DAILY FOR CHOLESTEROL   blood glucose meter kit and supplies KIT Dispense based on patient and insurance preference. Use up to four times daily as directed. (FOR ICD-9 250.00, 250.01).   buPROPion (WELLBUTRIN SR) 150 MG 12 hr tablet TAKE ONE TABLET (150MG TOTAL) BY MOUTH TWO TIMES DAILY   cetirizine (ZYRTEC) 10 MG tablet TAKE ONE TABLET (10MG TOTAL) BY MOUTH ATBEDTIME FOR ALLERGIES   citalopram (CELEXA) 40 MG tablet Take 1 tablet (40 mg total) by mouth daily. For anxiety and depression.   Continuous Blood Gluc Sensor (FREESTYLE LIBRE 14 DAY SENSOR) MISC USE TO TEST BLOOD SUGAR   cyclobenzaprine (FLEXERIL) 10 MG tablet Take 10 mg by mouth 2 (two) times daily as needed.   fluticasone (FLONASE) 50 MCG/ACT nasal spray Place 2 sprays into both nostrils daily.   fluticasone furoate-vilanterol (BREO ELLIPTA) 200-25 MCG/INH AEPB Inhale 1 puff into the lungs daily.   fluticasone furoate-vilanterol (BREO ELLIPTA) 200-25 MCG/INH AEPB Inhale 1 puff into the lungs daily.   glipiZIDE (GLUCOTROL) 10 MG tablet Take one tablet daily for diabetes.   insulin degludec (TRESIBA) 100 UNIT/ML FlexTouch Pen Inject 30 Units into the skin  daily.   Insulin Pen Needle (UNIFINE PENTIPS) 31G X 6 MM MISC USE NIGHTLY WITH INSULIN   Ipratropium-Albuterol (COMBIVENT) 20-100 MCG/ACT AERS respimat Inhale 1 puff into the lungs every 4 (four) hours as needed for wheezing.   JARDIANCE 10 MG TABS tablet TAKE ONE TABLET (10MG) BY MOUTH EVERY MORNING FOR DIABETES   Lancets (ONETOUCH DELICA PLUS [id number]) MISC USE TO CHECK BLOOD SUGAR UP TO FOUR TIMES DAILY   lansoprazole (PREVACID) 30 MG capsule Take 1 capsule (30 mg total) by mouth 2 (two) times daily before a meal. For heartburn   metFORMIN (GLUCOPHAGE-XR) 500 MG 24 hr tablet Take 2 tablets (1,000 mg total) by mouth 2 (two) times daily with a meal. For diabetes.   NUCYNTA ER 100 MG 12 hr tablet SMARTSIG:1 Tablet(s) By Mouth Every 12 Hours   ONETOUCH VERIO test strip USE TO CHECK BLOOD SUGAR UP TO FOUR TIMES DAILY   tamsulosin (FLOMAX) 0.4 MG CAPS capsule TAKE ONE CAPSULE BY MOUTH DAILY   Turmeric 500 MG CAPS Take 1 capsule by mouth daily.   zolpidem (AMBIEN) 10 MG tablet TAKE ONE TABLET (10MG TOTAL) BY MOUTH ATBEDTIME AS NEEDED FOR SLEEP   No facility-administered encounter medications on file as of 07/23/2021.    Recent Relevant Labs: Lab Results  Component Value Date/Time   HGBA1C 8.3 (A) 02/23/2021 08:23 AM   HGBA1C 9.6 (A) 05/03/2020 02:47 PM   HGBA1C 10.8 (H) 08/23/2019 08:57 AM   HGBA1C 9.9 (H) 05/21/2019 09:13 AM   MICROALBUR <0.7 05/03/2020 02:49 PM  MICROALBUR <0.7 05/21/2019 09:13 AM    Kidney Function Lab Results  Component Value Date/Time   CREATININE 0.94 03/05/2021 08:22 PM   CREATININE 0.94 05/03/2020 02:54 PM   GFR 84.95 05/03/2020 02:54 PM   GFRNONAA >60 03/05/2021 08:22 PM   GFRAA >60 07/13/2018 03:04 PM    Attempted contact with [facility] 3 times on 0/47/99, 07/26/21, 07/30/21. Unsuccessful outreach. Will attempt contact next month.   Current antihyperglycemic regimen:  Metformin XR 500 mg  - 2 tablets twice daily Jardiance 10 mg - 1 tablet  daily Glipizide 10 mg - 1 tablet daily Tresiba - Inject 30 units daily  Have there been any recent hospitalizations or ED visits since last visit with CPP? No  Adherence Review: Is the patient currently on a STATIN medication? Yes Is the patient currently on ACE/ARB medication? No Does the patient have >5 day gap between last estimated fill dates? Yes CPP to review    Star Rating Drugs:  Medication:  Last Fill: Day Supply Glipizide 10 mg           06/25/21             90 Jardiance 10 mg         05/14/21              90 Metformin 500 mg       12/26/20             90* Atorvastatin 40 mg      07/16/21             90  Upcoming appts: PCP appointment on 07/26/21 and Pulmonology appointment with on 08/10/21   Debbora Dus, CPP notified  Avel Sensor, [place] Assistant [phone]  I have reviewed the care management and care coordination activities outlined in this encounter and I am certifying that I agree with the content of this note. Will add CCM visit to calendar.  Debbora Dus, PharmD Clinical Pharmacist [facility] at [facility] [phone]

## 2021-07-26 ENCOUNTER — Encounter: Payer: Medicare HMO | Admitting: Primary Care

## 2021-08-03 DIAGNOSIS — M1711 Unilateral primary osteoarthritis, right knee: Secondary | ICD-10-CM | POA: Diagnosis not present

## 2021-08-07 ENCOUNTER — Other Ambulatory Visit: Payer: Self-pay | Admitting: Primary Care

## 2021-08-07 DIAGNOSIS — M25562 Pain in left knee: Secondary | ICD-10-CM

## 2021-08-07 DIAGNOSIS — G8929 Other chronic pain: Secondary | ICD-10-CM

## 2021-08-07 DIAGNOSIS — G4700 Insomnia, unspecified: Secondary | ICD-10-CM

## 2021-08-10 ENCOUNTER — Ambulatory Visit: Payer: Medicare HMO | Admitting: Internal Medicine

## 2021-08-14 ENCOUNTER — Telehealth: Payer: Self-pay

## 2021-08-20 ENCOUNTER — Ambulatory Visit: Payer: Medicare HMO | Admitting: Internal Medicine

## 2021-09-01 ENCOUNTER — Other Ambulatory Visit: Payer: Self-pay | Admitting: Primary Care

## 2021-09-01 DIAGNOSIS — E119 Type 2 diabetes mellitus without complications: Secondary | ICD-10-CM

## 2021-09-01 DIAGNOSIS — F32A Depression, unspecified: Secondary | ICD-10-CM

## 2021-09-01 DIAGNOSIS — F419 Anxiety disorder, unspecified: Secondary | ICD-10-CM

## 2021-09-02 NOTE — Telephone Encounter (Signed)
Will you find out where he is in terms of getting his insurance back up and running?   He really needs CPE with full follow up.   Let me know.

## 2021-09-03 ENCOUNTER — Emergency Department (HOSPITAL_COMMUNITY): Payer: Self-pay

## 2021-09-03 ENCOUNTER — Emergency Department (HOSPITAL_COMMUNITY)
Admission: EM | Admit: 2021-09-03 | Discharge: 2021-09-03 | Disposition: A | Discharge status: Home / Self Care | Payer: Self-pay | Attending: Emergency Medicine | Admitting: Emergency Medicine

## 2021-09-03 ENCOUNTER — Encounter (HOSPITAL_COMMUNITY): Payer: Self-pay | Admitting: *Deleted

## 2021-09-03 ENCOUNTER — Other Ambulatory Visit: Payer: Self-pay

## 2021-09-03 DIAGNOSIS — E119 Type 2 diabetes mellitus without complications: Secondary | ICD-10-CM | POA: Insufficient documentation

## 2021-09-03 DIAGNOSIS — Z7951 Long term (current) use of inhaled steroids: Secondary | ICD-10-CM | POA: Insufficient documentation

## 2021-09-03 DIAGNOSIS — F17210 Nicotine dependence, cigarettes, uncomplicated: Secondary | ICD-10-CM | POA: Insufficient documentation

## 2021-09-03 DIAGNOSIS — Z794 Long term (current) use of insulin: Secondary | ICD-10-CM | POA: Insufficient documentation

## 2021-09-03 DIAGNOSIS — X500XXA Overexertion from strenuous movement or load, initial encounter: Secondary | ICD-10-CM | POA: Insufficient documentation

## 2021-09-03 DIAGNOSIS — J449 Chronic obstructive pulmonary disease, unspecified: Secondary | ICD-10-CM | POA: Insufficient documentation

## 2021-09-03 DIAGNOSIS — Y9289 Other specified places as the place of occurrence of the external cause: Secondary | ICD-10-CM | POA: Insufficient documentation

## 2021-09-03 DIAGNOSIS — M25561 Pain in right knee: Secondary | ICD-10-CM | POA: Insufficient documentation

## 2021-09-03 DIAGNOSIS — Z96652 Presence of left artificial knee joint: Secondary | ICD-10-CM | POA: Insufficient documentation

## 2021-09-03 DIAGNOSIS — Z7984 Long term (current) use of oral hypoglycemic drugs: Secondary | ICD-10-CM | POA: Insufficient documentation

## 2021-09-03 DIAGNOSIS — J45909 Unspecified asthma, uncomplicated: Secondary | ICD-10-CM | POA: Insufficient documentation

## 2021-09-03 NOTE — Telephone Encounter (Signed)
Called patient number we have is not working. Looks like he is currently admitted and the insurance that is in chart now should be current was ran and approved yesterday.

## 2021-09-03 NOTE — ED Provider Notes (Signed)
[facility] EMERGENCY DEPARTMENT Provider Note   CSN: [id number] Arrival date & time: 09/03/21  1141     History Chief Complaint  Patient presents with   Knee Pain    BRONCO MCGRORY is a 50 y.o. male.   Knee Pain Associated symptoms: no back pain, no fatigue and no fever        CALDER OBLINGER is a 50 y.o. male with past medical history of osteoarthritis of the left knee, COPD, type 2 diabetes, who presents to the Emergency Department complaining of worsening right knee pain after moving a generator yesterday.  He states he has been having some mild "popping" to his right knee with bending especially walking up and down stairs.  After moving a generator yesterday, he felt a loud pop in his right knee and has had constant pain to the lateral aspect of the knee.  Pain is worse with weightbearing, improves at rest.  Feels as though his knee is going to "give away."  He has been taking his prescribed pain medication with relief.  He states that he is scheduled for an MRI of his knee later in October.  He denies fall, redness, excessive warmth, fever chills, pain to the back of his knee or redness of the joint.   Past Medical History:  Diagnosis Date   Acute encephalopathy 03/24/2018   Anxiety    Arthritis    oa left knee and lower back   Asthma    Cataract    hx of bilateral   Chronic back pain    Chronic pain of left knee    COPD (chronic obstructive pulmonary disease) ([place])    Diabetes mellitus    type 2   Encephalopathy acute 03/24/2018   GERD (gastroesophageal reflux disease)    INGUINAL PAIN, LEFT 10/03/2009   Qualifier: Diagnosis of  By: Oneida Alar MD, Sandi L    Oral thrush 11/10/2018   Overdose    03-24-18 accidental   Shortness of breath    with heavy exertion   Sleep apnea     Patient Active Problem List   Diagnosis Date Noted   Cervical lymphadenitis 11/27/2020   Preventative health care 07/05/2020   Leukocytosis 05/04/2020   Chronic fatigue 05/03/2020   Medicare  annual wellness visit, subsequent 06/28/2019   Controlled substance agreement broken 09/14/2018   Primary osteoarthritis of knee 07/21/2018   Status post unicompartmental knee replacement, left 07/21/2018   Medicare annual wellness visit, initial 05/20/2018   Effusion of knee joint (Left) 04/21/2018   Opiate overdose ([place]) 03/24/2018   Arthropathy of knee (Left) 01/12/2018   Opioid-induced constipation (OIC) 01/12/2018   Tinea corporis 01/07/2018   Tinea versicolor 01/07/2018   Pharmacologic therapy 12/08/2017   Problems influencing health status 12/08/2017   Long term prescription opiate use 12/08/2017   Opiate use 12/08/2017   Insomnia 11/20/2017   Disorder of skeletal system 11/05/2017   Chronic knee pain (Primary Area of Pain) (Bilateral) (L>R) 11/05/2017   Chronic wrist pain (Secondary Area of Pain) (Left) 11/05/2017   Chronic constipation 08/18/2017   Hyperlipidemia 05/12/2017   COPD without exacerbation ([place]) 05/07/2017   Male hypogonadism 04/24/2017   Post-void dribbling 04/01/2017   Osteoarthritis of the knee (Left)    Rash and nonspecific skin eruption 03/12/2017   GERD (gastroesophageal reflux disease) 03/12/2017   Bucket handle tear of meniscus of knee (Right)    Anxiety and depression 10/07/2015   Morbid obesity ([place]) 10/06/2015   OSA (obstructive sleep apnea) 08/31/2014  Testosterone deficiency 07/01/2014   S/P knee surgery 04/26/2014   Meniscal tear of knee, sequela (Left) 04/26/2014   Medial meniscus, posterior horn derangement 04/22/2014   Intrinsic asthma 12/19/2013   Chronic pain syndrome 05/13/2013   Type 2 diabetes mellitus ([place]) 05/13/2013   Mediastinal abnormality 04/10/2012   Cigarette smoker 04/10/2012    Past Surgical History:  Procedure Laterality Date   CARPAL TUNNEL RELEASE Left    CATARACT EXTRACTION W/PHACO Right 03/11/2016   Procedure: CATARACT EXTRACTION PHACO AND INTRAOCULAR LENS PLACEMENT ([place]);  Surgeon: Tonny Branch, MD;  Location: [facility];  Service: Ophthalmology;  Laterality: Right;  CDE 4.24   EYE SURGERY Right 2018   ioc with lens replacement   HERNIA REPAIR Bilateral 7915   umbilical   KNEE ARTHROSCOPY WITH MEDIAL MENISECTOMY Left 04/22/2014   Procedure: LEFT KNEE ARTHROSCOPY WITH MEDIAL MENISECTOMY;  Surgeon: Carole Civil, MD;  Location: [facility]S;  Service: Orthopedics;  Laterality: Left;   KNEE ARTHROSCOPY WITH MEDIAL MENISECTOMY Right 12/06/2015   Procedure: RIGHT KNEE ARTHROSCOPY WITH MEDIAL MENISECTOMY;  Surgeon: Carole Civil, MD;  Location: [facility];  Service: Orthopedics;  Laterality: Right;   KNEE ARTHROSCOPY WITH MEDIAL MENISECTOMY Left 03/20/2017   Procedure: KNEE ARTHROSCOPY WITH MEDIAL MENISECTOMY;  Surgeon: Carole Civil, MD;  Location: [facility];  Service: Orthopedics;  Laterality: Left;   ORIF WRIST FRACTURE Left 12/19/2016   Procedure: OPEN REDUCTION INTERNAL FIXATION (ORIF) WRIST FRACTURE;  Surgeon: Leanora Cover, MD;  Location: [place];  Service: Orthopedics;  Laterality: Left;  accumed    PARTIAL KNEE ARTHROPLASTY Left 07/21/2018   Procedure: LEFT UNICOMPARTMENTAL KNEE;  Surgeon: Renette Butters, MD;  Location: [facility];  Service: Orthopedics;  Laterality: Left;   SHOULDER ARTHROCENTESIS Right 2008 or 2010   VASECTOMY  2014       Family History  Problem Relation Age of Onset   Emphysema Maternal Grandfather    Emphysema Maternal Grandmother    Clotting disorder Maternal Grandmother     Social History   Tobacco Use   Smoking status: Every Day    Packs/day: 1.25    Years: 26.00    Pack years: 32.50    Types: Cigarettes    Start date: 12/17/1993   Smokeless tobacco: Never   Tobacco comments:    1 ppd 01/04/2020  Vaping Use   Vaping Use: Former  Substance Use Topics   Alcohol use: Never    Alcohol/week: 0.0 standard drinks   Drug use: Never    Home Medications Prior to Admission medications   Medication Sig Start Date End Date Taking? Authorizing Provider   gabapentin (NEURONTIN) 300 MG capsule TAKE 2 CAPSULES BY MOUTH TWICE A DAY. 06/05/21   Dutch Quint B, FNP  albuterol (PROVENTIL) (2.5 MG/3ML) 0.083% nebulizer solution USE 3 ML VIA NEBULIZER FOUR TIMES DAILY AS NEEDED FOR SHORTNESS OF BREATH 06/25/21   Tanda Rockers, MD  ANDROGEL PUMP 20.25 MG/ACT (1.62%) GEL Apply 2 Pump topically daily.  02/25/18   [provider]  atorvastatin (LIPITOR) 40 MG tablet TAKE ONE TABLET (40MG TOTAL) BY MOUTH DAILY FOR CHOLESTEROL 03/27/21   Pleas Koch, NP  blood glucose meter kit and supplies KIT Dispense based on patient and insurance preference. Use up to four times daily as directed. (FOR ICD-9 250.00, 250.01). 06/28/19   Pleas Koch, NP  buPROPion [facility] SR) 150 MG 12 hr tablet TAKE ONE TABLET (150MG TOTAL) BY MOUTH TWO TIMES DAILY 05/25/21   [name]  K, NP  cetirizine (ZYRTEC) 10 MG tablet TAKE ONE TABLET (10MG TOTAL) BY MOUTH ATBEDTIME FOR ALLERGIES 03/28/21   Pleas Koch, NP  citalopram (CELEXA) 40 MG tablet Take 1 tablet (40 mg total) by mouth daily. For anxiety and depression. 04/28/21   Pleas Koch, NP  Continuous Blood Gluc Sensor (FREESTYLE LIBRE 14 DAY SENSOR) MISC USE TO TEST BLOOD SUGAR 02/01/21   Pleas Koch, NP  cyclobenzaprine (FLEXERIL) 10 MG tablet Take 10 mg by mouth 2 (two) times daily as needed.    [provider]  fluticasone (FLONASE) 50 MCG/ACT nasal spray Place 2 sprays into both nostrils daily. 02/22/20   Jearld Fenton, NP  fluticasone furoate-vilanterol (BREO ELLIPTA) 200-25 MCG/INH AEPB Inhale 1 puff into the lungs daily. 03/29/21   Tanda Rockers, MD  fluticasone furoate-vilanterol (BREO ELLIPTA) 200-25 MCG/INH AEPB Inhale 1 puff into the lungs daily. 06/13/21   Tanda Rockers, MD  glipiZIDE (GLUCOTROL) 10 MG tablet Take one tablet daily for diabetes. 03/28/21   Pleas Koch, NP  insulin degludec (TRESIBA) 100 UNIT/ML FlexTouch Pen Inject 30 Units into the skin daily.  03/19/21   Pleas Koch, NP  Insulin Pen Needle (UNIFINE PENTIPS) 31G X 6 MM MISC USE NIGHTLY WITH INSULIN 06/26/21   Pleas Koch, NP  Ipratropium-Albuterol (COMBIVENT) 20-100 MCG/ACT AERS respimat Inhale 1 puff into the lungs every 4 (four) hours as needed for wheezing. 03/20/21   Tanda Rockers, MD  JARDIANCE 10 MG TABS tablet TAKE ONE TABLET (10MG) BY MOUTH EVERY MORNING FOR DIABETES 03/27/21   Pleas Koch, NP  Lancets [facility] DELICA PLUS [id number]) MISC USE TO CHECK BLOOD SUGAR UP TO FOUR TIMES DAILY 09/14/19   Pleas Koch, NP  lansoprazole (PREVACID) 30 MG capsule Take 1 capsule (30 mg total) by mouth 2 (two) times daily before a meal. For heartburn 05/03/21   Pleas Koch, NP  metFORMIN (GLUCOPHAGE-XR) 500 MG 24 hr tablet Take 2 tablets (1,000 mg total) by mouth 2 (two) times daily with a meal. For diabetes. 03/27/21   Pleas Koch, NP  NUCYNTA ER 100 MG 12 hr tablet SMARTSIG:1 Tablet(s) By Mouth Every 12 Hours 02/13/21   [provider]  [facility] VERIO test strip USE TO CHECK BLOOD SUGAR UP TO FOUR TIMES DAILY 09/14/19   Pleas Koch, NP  tamsulosin (FLOMAX) 0.4 MG CAPS capsule TAKE ONE CAPSULE BY MOUTH DAILY 07/21/19   Pleas Koch, NP  Turmeric 500 MG CAPS Take 1 capsule by mouth daily.    [provider]  zolpidem (AMBIEN) 10 MG tablet TAKE ONE TABLET (10MG TOTAL) BY MOUTH ATBEDTIME AS NEEDED FOR SLEEP 08/08/21   Pleas Koch, NP    Allergies    Azithromycin, Clarithromycin, Erythromycin, Keflex [cephalexin], Metformin, and Symbicort [budesonide-formoterol fumarate]  Review of Systems   Review of Systems  Constitutional:  Negative for chills, fatigue and fever.  Respiratory:  Negative for shortness of breath.   Cardiovascular:  Negative for chest pain and leg swelling.  Genitourinary:  Negative for hematuria.  Musculoskeletal:  Positive for arthralgias (Right knee pain). Negative for back pain.  Skin:  Negative for  color change and rash.  Neurological:  Negative for dizziness, weakness and numbness.  Hematological:  Does not bruise/bleed easily.   Physical Exam Updated Vital Signs BP 131/85 (BP Location: Right Arm)   Pulse 98   Temp 98.3 F (36.8 C) (Oral)   Resp 18   Ht 5'  10" (1.778 m)   Wt 117.9 kg   SpO2 97%   BMI 37.30 kg/m   Physical Exam Vitals and nursing note reviewed.  Constitutional:      General: He is not in acute distress.    Appearance: Normal appearance. He is not toxic-appearing.  HENT:     Head: Atraumatic.     Mouth/Throat:     Mouth: Mucous membranes are moist.  Cardiovascular:     Rate and Rhythm: Normal rate and regular rhythm.     Pulses: Normal pulses.  Pulmonary:     Effort: Pulmonary effort is normal.  Musculoskeletal:        General: Tenderness and signs of injury present. No swelling or deformity. Normal range of motion.  Skin:    General: Skin is warm.     Capillary Refill: Capillary refill takes less than 2 seconds.     Findings: No erythema.  Neurological:     General: No focal deficit present.     Mental Status: He is alert.     Sensory: No sensory deficit.     Motor: No weakness.    ED Results / Procedures / Treatments   Labs (all labs ordered are listed, but only abnormal results are displayed) Labs Reviewed - No data to display  EKG None  Radiology DG Knee Complete 4 Views Right  Result Date: 09/03/2021 CLINICAL DATA:  Knee pain, heard a pop when lifting a generator yesterday EXAM: RIGHT KNEE - COMPLETE 4+ VIEW COMPARISON:  Knee radiographs 10/14/2015 FINDINGS: There is no acute fracture or dislocation. Knee alignment is normal. The joint spaces are preserved. There is no significant effusion. Vascular calcifications are noted. IMPRESSION: Unremarkable knee radiographs. Electronically Signed   By: Valetta Mole M.D.   On: 09/03/2021 13:37    Procedures Procedures   Medications Ordered in ED Medications - No data to display  ED  Course  I have reviewed the triage vital signs and the nursing notes.  Pertinent labs & imaging results that were available during my care of the patient were reviewed by me and considered in my medical decision making (see chart for details).    MDM Rules/Calculators/A&P                           Patient here with likely acute on chronic right knee pain after moving a generator yesterday.  He felt a loud pop to his knee and is now having constant pain with feeling of instability of his knee.  No edema erythema or excessive warmth of the joint.  No obvious ligamentous instability or effusion.  X-ray of the knee without acute findings.  Injury may be meniscal.  I recommended knee bracing and orthopedic follow-up.  Patient states he has a knee brace at home and is using a cane.  He has an upcoming MRI scheduled of the knee and would like to have the MRI performed today.  I explained that I do not have an emergent need for MRI of the knee today.  He very understandable of this and will continue conservative therapy and will follow-up outpatient with his orthopedic provider.  Final Clinical Impression(s) / ED Diagnoses Final diagnoses:  Acute pain of right knee    Rx / DC Orders ED Discharge Orders     None        Bufford Lope 09/03/21 1406    Dorie Rank, MD 09/03/21 2007

## 2021-09-03 NOTE — Discharge Instructions (Signed)
Your x-ray today was reassuring.  I recommend that you wear your knee brace while standing or walking.  You may apply ice packs on and off to your knee.  Call Dr. Greig Right office to arrange a follow-up appointment.

## 2021-09-03 NOTE — ED Triage Notes (Signed)
Pt c/o right knee pain after he moved a generator yesterday. Pt reports he heard a "pop" sound and felt pain when he went to lift the generator.

## 2021-09-04 NOTE — Telephone Encounter (Signed)
He was not admitted, just an ED visit.  Needs to be contacted again. Try MyChart.

## 2021-09-10 NOTE — Telephone Encounter (Signed)
Left message to return call to our office.  

## 2021-09-12 ENCOUNTER — Telehealth: Payer: Self-pay

## 2021-09-12 NOTE — Progress Notes (Addendum)
Chronic Care Management Pharmacy Assistant   Name: Joseph [name]  MRN: [id number] DOB: [birth year]  Reason for Encounter: Diabetes Disease State   Recent office visits:  None since last CCM contact  Recent consult visits:  09/03/2021 - [facility] - Patient presented for knee pain. Xray ordered. Recommended knee bracing and orthopedic follow up. Discharged same day.   Hospital visits:  None in previous 6 months  Medications: Outpatient Encounter Medications as of 09/12/2021  Medication Sig   gabapentin (NEURONTIN) 300 MG capsule TAKE 2 CAPSULES BY MOUTH TWICE A DAY.   albuterol (PROVENTIL) (2.5 MG/3ML) 0.083% nebulizer solution USE 3 ML VIA NEBULIZER FOUR TIMES DAILY AS NEEDED FOR SHORTNESS OF BREATH   ANDROGEL PUMP 20.25 MG/ACT (1.62%) GEL Apply 2 Pump topically daily.    atorvastatin (LIPITOR) 40 MG tablet TAKE ONE TABLET (40MG TOTAL) BY MOUTH DAILY FOR CHOLESTEROL   blood glucose meter kit and supplies KIT Dispense based on patient and insurance preference. Use up to four times daily as directed. (FOR ICD-9 250.00, 250.01).   buPROPion (WELLBUTRIN SR) 150 MG 12 hr tablet Take 1 tablet (150 mg total) by mouth 2 (two) times daily. For anxiety and depression. Office visit required for further refills.   cetirizine (ZYRTEC) 10 MG tablet TAKE ONE TABLET (10MG TOTAL) BY MOUTH ATBEDTIME FOR ALLERGIES   citalopram (CELEXA) 40 MG tablet Take 1 tablet (40 mg total) by mouth daily. For anxiety and depression.   Continuous Blood Gluc Sensor (FREESTYLE LIBRE 14 DAY SENSOR) MISC USE TO TEST BLOOD SUGAR   cyclobenzaprine (FLEXERIL) 10 MG tablet Take 10 mg by mouth 2 (two) times daily as needed.   empagliflozin (JARDIANCE) 10 MG TABS tablet Take 1 tablet (10 mg total) by mouth daily. For diabetes. Office visit required for further refills.   fluticasone (FLONASE) 50 MCG/ACT nasal spray Place 2 sprays into both nostrils daily.   fluticasone furoate-vilanterol (BREO ELLIPTA) 200-25 MCG/INH AEPB  Inhale 1 puff into the lungs daily.   fluticasone furoate-vilanterol (BREO ELLIPTA) 200-25 MCG/INH AEPB Inhale 1 puff into the lungs daily.   glipiZIDE (GLUCOTROL) 10 MG tablet Take one tablet daily for diabetes.   insulin degludec (TRESIBA) 100 UNIT/ML FlexTouch Pen Inject 30 Units into the skin daily.   Insulin Pen Needle (UNIFINE PENTIPS) 31G X 6 MM MISC USE NIGHTLY WITH INSULIN   Ipratropium-Albuterol (COMBIVENT) 20-100 MCG/ACT AERS respimat Inhale 1 puff into the lungs every 4 (four) hours as needed for wheezing.   Lancets (ONETOUCH DELICA PLUS [id number]) MISC USE TO CHECK BLOOD SUGAR UP TO FOUR TIMES DAILY   lansoprazole (PREVACID) 30 MG capsule Take 1 capsule (30 mg total) by mouth 2 (two) times daily before a meal. For heartburn   metFORMIN (GLUCOPHAGE-XR) 500 MG 24 hr tablet Take 2 tablets (1,000 mg total) by mouth 2 (two) times daily with a meal. For diabetes.   NUCYNTA ER 100 MG 12 hr tablet SMARTSIG:1 Tablet(s) By Mouth Every 12 Hours   ONETOUCH VERIO test strip USE TO CHECK BLOOD SUGAR UP TO FOUR TIMES DAILY   tamsulosin (FLOMAX) 0.4 MG CAPS capsule TAKE ONE CAPSULE BY MOUTH DAILY   Turmeric 500 MG CAPS Take 1 capsule by mouth daily.   zolpidem (AMBIEN) 10 MG tablet TAKE ONE TABLET (10MG TOTAL) BY MOUTH ATBEDTIME AS NEEDED FOR SLEEP   No facility-administered encounter medications on file as of 09/12/2021.     Recent Relevant Labs: Lab Results  Component Value Date/Time   HGBA1C 8.3 (A)  02/23/2021 08:23 AM   HGBA1C 9.6 (A) 05/03/2020 02:47 PM   HGBA1C 10.8 (H) 08/23/2019 08:57 AM   HGBA1C 9.9 (H) 05/21/2019 09:13 AM   MICROALBUR <0.7 05/03/2020 02:49 PM   MICROALBUR <0.7 05/21/2019 09:13 AM    Kidney Function Lab Results  Component Value Date/Time   CREATININE 0.94 03/05/2021 08:22 PM   CREATININE 0.94 05/03/2020 02:54 PM   GFR 84.95 05/03/2020 02:54 PM   GFRNONAA >60 03/05/2021 08:22 PM   GFRAA >60 07/13/2018 03:04 PM   Attempted contact with patient 3 times on  10/12, 10/14 and 10/17. Unsuccessful outreach. Will attempt contact next month.   Current antihyperglycemic regimen:  Metformin XR 500 mg  - 2 tablets twice daily Jardiance 10 mg - 1 tablet daily Glipizide 10 mg - 1 tablet daily Tresiba - Inject 30 units daily  Adherence Review: Is the patient currently on a STATIN medication? Yes Is the patient currently on ACE/ARB medication? No Does the patient have >5 day gap between last estimated fill dates? No  Care Gaps: Annual wellness visit in last year? No Most recent A1C reading: 8.3 on 02/23/2021 Most Recent BP reading: 131/85 on 09/03/2021  Last eye exam / retinopathy screening: 11/03/2017 Last diabetic foot exam: 06/21/2020  Counseled patient on importance of annual eye and foot exam.   Star Rating Drugs:  Medication:  Last Fill: Day Supply Glipizide 10 mg           09/08/2021 90 Jardiance 10 mg         09/04/2021     30 Metformin 500 mg       09/02/2021 90 Atorvastatin 40 mg      07/16/2021 90  Pulmonology appointment with on 09/24/2021  Debbora Dus, CPP notified  Marijean Niemann, [place] Assistant [phone]  Time Spent: 17 Minutes  I have reviewed the care management and care coordination activities outlined in this encounter and I am certifying that I agree with the content of this note. No further action required.  Debbora Dus, PharmD Clinical Pharmacist [facility] [phone]

## 2021-09-14 ENCOUNTER — Other Ambulatory Visit: Payer: Self-pay | Admitting: Primary Care

## 2021-09-14 DIAGNOSIS — F419 Anxiety disorder, unspecified: Secondary | ICD-10-CM

## 2021-09-14 DIAGNOSIS — F32A Depression, unspecified: Secondary | ICD-10-CM

## 2021-09-15 ENCOUNTER — Other Ambulatory Visit: Payer: Self-pay | Admitting: Internal Medicine

## 2021-09-23 ENCOUNTER — Other Ambulatory Visit: Payer: Self-pay | Admitting: Primary Care

## 2021-09-23 DIAGNOSIS — J302 Other seasonal allergic rhinitis: Secondary | ICD-10-CM

## 2021-09-24 ENCOUNTER — Ambulatory Visit: Payer: Self-pay | Admitting: Internal Medicine

## 2021-09-24 NOTE — Progress Notes (Deleted)
Subjective:   Patient ID: Joseph [name], male    DOB: 07/14/1971   MRN: [id number]    Brief patient profile:  62  yowm active smoker without significant airflow obst by PFTs 06/02/12 and 03/08/2015    History of Present Illness  In April 2013 cc  had pneumonia apparently. Went to [facility] ER and found some abnormality (nodule based on review of ER notes) on CXR that resulted in CT Angio 03/07/12 same visit. PE ruled out but found to have mediastinal nodes but the cxr nodule was not there. Apparently ER doctor said it was lymphoma and asked to see an oncologist. But primary care doctor felt that was over zealous and referred patient here. He prefers conservative mgmt to nodes if clinical suspicion for cancer is low  cc lack of energy (spent most of days yesterday in bed), esp in pollen, chronic dyspnea on exertion for 180 feet relieved by rest  But slowly progressive since 2002 for which he uses albuterol prn on average 4 times a day.  Walkt test 185 feet x 3 laps: no desaturation  He smokes at baseline; trying quit, prior intolerance (dreams) with chantix.   Only baseline mild chronic cough with associtaed wheeze that is occasional.. Does have some B symptoms like nocturnal diaphoresis (soaks pillows). Denies weight loss.   CT ANGIOGRAPHY CHEST  Comparison: 03/07/2012  An abnormal right lower paratracheal node has a short axis diameter  of 1.6 cm. An abnormal AP window lymph node has a short axis  diameter of 1.4 cm. Mild bilateral hilar and infrahilar adenopathy  noted. Scattered small axillary lymph nodes are present.  A subcarinal node has a short axis diameter of 1.5 cm.     OV 08/14/2012 Mediastinal nodes: PET scan 06/10/12 shows very low uptake in the mediastinal nodes. Suspicion for cancer is very low. Lymph nodes include 13 mm right lower paratracheal lymph node and 10 mm prevascular lymph node. ACE level 04/10/12  - is in 20s and normal  Still dyspneic: 1 flught of steps and better  with rest and with albuterol and atrovent prn. Dyspne also worse in winter and cooler weathers. Rates it as moderate. STable since last visit to slightly worse. thre is some associtaed cough. No asociated weight loss. COugh is moderate and worst early in morning and in cooler temperatues.. Denies sinus drainage. PFts show mixed picture - fvc 3.4L/69%, fev1 2.9/76%, Ratio 85 but 12% bd respose. TLC 5.7/83%. DLCO 23.7/69% rec rec #MEdiastinal node  - suspicion for cancer is low  - you will need CT chest in a year; we will schedule it later  #Smoking  - glad you are working on quitting  #Shortness of breath - this is due to asthma/copd and weight  - please start QVAR 2 puff twice daily - take sample and show technique and take prescription too  - focus on weight loss through the low glycemic diet; take diet sheet from [place]  #FOllowup  - 3 months with spirometry at followup  - flu shot and pneumovax through PMD as discussed  Progress notes Annotated 09/24/2021    11/10/2018  f/u ov/Joseph [name] re: asthma/ still smoking  Chief Complaint  Patient presents with   Follow-up    Breathing is overall doing well. He states when it is really cold out he uses his neb more- approx 5 x per wk. He does not use his combivent.   Dyspnea:  Fine as long as remembers to use breo 200 and it's  not real cold outside, does fine in heat  Cough: none Sleeping: on cpap per Vassie Loll  s concerns SABA use: only on cold days then uses neb up to 3 x in a day if out it in, lasts "a few hours"  02: none   Thinking about hypnosis for cig smoking/ could not tol chantix rec Plan A = Automatic = BREO 200 one each am  Plan B = Backup Only use your  combivent respimat    Plan C = Crisis - only use your albuterol nebulizer if you first try Plan B and it fails to help > ok to use the nebulizer up to every 4 hours but if start needing it regularly call for immediate appointment   01/04/2020  f/u ov/Joseph [name] re: smoker / ab  Chief  Complaint  Patient presents with   Follow-up    Breathing has been worse lately- relates to colder weather. He is using combivent 2 x per wk and neb about 2 x per day.   Dyspnea:  Just when coughing to point of gagging  Cough: esp with cold weather exposure despite Breo 200 ? Better on dulera 200  Sleeping: on cpap fine  SABA use: way too much 02: no  rec Stop [employer] dulera 200 Take 2 puffs first thing in am and then another 2 puffs about 12 hours later.  Plan A = Automatic = Always=   Dulera 200 Take 2 puffs first thing in am and then another 2 puffs about 12 hours later.  Plan B = Backup (to supplement plan A, not to replace it) Only use your combivent as a rescue medication   Plan C = Crisis (instead of Plan B but only if Plan B stops working) - only use your albuterol nebulizer if you first try Plan B and it fails to help > ok to use the nebulizer up to every 4 hours but if start needing it regularly call for immediate appointment Depomedrol 120 mg IM today    08/18/2020  f/u ov/Joseph [name] re:  AB/ still smoking/ not vaccinated  Chief Complaint  Patient presents with   Follow-up    Breathing is unchanged since the last visit. He is using his combivent 3-4 x per wk.    Dyspnea:  98 Cough: better, no more gagging since changed breo inhalation  Sleeping: problems with cpap machine/ alva  SABA use: as above 02: none Rec  I very strongly recommend you get the moderna or pfizer vaccine No change rx   09/24/2021  f/u ov/Joseph [name] re: AB/smoker    maint on ***  No chief complaint on file.   Dyspnea:  *** Cough: *** Sleeping: *** SABA use: *** 02: *** Covid status:   ***   No obvious day to day or daytime variability or assoc excess/ purulent sputum or mucus plugs or hemoptysis or cp or chest tightness, subjective wheeze or overt sinus or hb symptoms.   *** without nocturnal  or early am exacerbation  of respiratory  c/o's or need for noct saba. Also denies any obvious fluctuation  of symptoms with weather or environmental changes or other aggravating or alleviating factors except as outlined above   No unusual exposure hx or h/o childhood pna/ asthma or knowledge of premature birth.  Current Allergies, Complete Past Medical History, Past Surgical History, Family History, and Social History were reviewed in [employer] record.  ROS  The following are not active complaints unless bolded Hoarseness, sore throat, dysphagia,  dental problems, itching, sneezing,  nasal congestion or discharge of excess mucus or purulent secretions, ear ache,   fever, chills, sweats, unintended wt loss or wt gain, classically pleuritic or exertional cp,  orthopnea pnd or arm/hand swelling  or leg swelling, presyncope, palpitations, abdominal pain, anorexia, nausea, vomiting, diarrhea  or change in bowel habits or change in bladder habits, change in stools or change in urine, dysuria, hematuria,  rash, arthralgias, visual complaints, headache, numbness, weakness or ataxia or problems with walking or coordination,  change in mood or  memory.        No outpatient medications have been marked as taking for the 09/24/21 encounter (Appointment) with Nyoka Cowden, MD.                  Objective:   Physical Exam  09/24/2021     ***  08/18/2020     252  01/04/2020        251  12/31/2013  294  >    07/27/2014  286 > 11/29/2014   289> 03/08/2015  284 > 10/03/2015 266 > 10/31/2015 273 > 12/12/2015    280 > 05/23/2017  245 > 11/10/2018  245 >    Vital signs reviewed  09/24/2021  - Note at rest 02 sats  ***% on ***   General appearance:    ***                Assessment & Plan:

## 2021-09-25 ENCOUNTER — Other Ambulatory Visit: Payer: Self-pay | Admitting: Primary Care

## 2021-09-25 DIAGNOSIS — F32A Depression, unspecified: Secondary | ICD-10-CM

## 2021-09-25 DIAGNOSIS — F419 Anxiety disorder, unspecified: Secondary | ICD-10-CM

## 2021-09-25 NOTE — Telephone Encounter (Signed)
Called l/m to call office for appointment

## 2021-10-04 ENCOUNTER — Other Ambulatory Visit: Payer: Self-pay | Admitting: Primary Care

## 2021-10-04 DIAGNOSIS — E119 Type 2 diabetes mellitus without complications: Secondary | ICD-10-CM

## 2021-10-18 ENCOUNTER — Telehealth: Payer: Self-pay

## 2021-10-18 DIAGNOSIS — E119 Type 2 diabetes mellitus without complications: Secondary | ICD-10-CM

## 2021-10-18 MED ORDER — EMPAGLIFLOZIN 10 MG PO TABS: 10.0000 mg | ORAL_TABLET | Freq: Every day | ORAL | 0 refills | Status: DC

## 2021-10-18 NOTE — Progress Notes (Signed)
Chronic Care Management Pharmacy Assistant   Name: Joseph [name]  MRN: [id number] DOB: [birth year]  Reason for Encounter: CCM (Diabetes Disease State)   Recent office visits:  None since last CCM contact  Recent consult visits:  None since last CCM contact  Hospital visits:  None since last CCM contact  Medications: Outpatient Encounter Medications as of 10/18/2021  Medication Sig   gabapentin (NEURONTIN) 300 MG capsule TAKE 2 CAPSULES BY MOUTH TWICE A DAY.   albuterol (PROVENTIL) (2.5 MG/3ML) 0.083% nebulizer solution USE 3 ML VIA NEBULIZER FOUR TIMES DAILY AS NEEDED FOR SHORTNESS OF BREATH   ANDROGEL PUMP 20.25 MG/ACT (1.62%) GEL Apply 2 Pump topically daily.    atorvastatin (LIPITOR) 40 MG tablet TAKE ONE TABLET (40MG TOTAL) BY MOUTH DAILY FOR CHOLESTEROL   blood glucose meter kit and supplies KIT Dispense based on patient and insurance preference. Use up to four times daily as directed. (FOR ICD-9 250.00, 250.01).   buPROPion (WELLBUTRIN SR) 150 MG 12 hr tablet Take 1 tablet (150 mg total) by mouth 2 (two) times daily. For anxiety and depression. Office visit required for further refills.   cetirizine (ZYRTEC) 10 MG tablet Take 1 tablet (10 mg total) by mouth at bedtime for allergies.   citalopram (CELEXA) 40 MG tablet Take 1 tablet (40 mg total) by mouth daily. For anxiety and depression. Office visit required for further refills.   Continuous Blood Gluc Sensor (FREESTYLE LIBRE 14 DAY SENSOR) MISC USE TO TEST BLOOD SUGAR   cyclobenzaprine (FLEXERIL) 10 MG tablet Take 10 mg by mouth 2 (two) times daily as needed.   empagliflozin (JARDIANCE) 10 MG TABS tablet Take 1 tablet (10 mg total) by mouth daily. For diabetes. Office visit required for further refills.   fluticasone (FLONASE) 50 MCG/ACT nasal spray Place 2 sprays into both nostrils daily.   fluticasone furoate-vilanterol (BREO ELLIPTA) 200-25 MCG/INH AEPB Inhale 1 puff into the lungs daily.   fluticasone furoate-vilanterol  (BREO ELLIPTA) 200-25 MCG/INH AEPB Inhale 1 puff into the lungs daily.   glipiZIDE (GLUCOTROL) 10 MG tablet Take one tablet daily for diabetes.   insulin degludec (TRESIBA) 100 UNIT/ML FlexTouch Pen Inject 30 Units into the skin daily.   Insulin Pen Needle (UNIFINE PENTIPS) 31G X 6 MM MISC USE NIGHTLY WITH INSULIN   Ipratropium-Albuterol (COMBIVENT RESPIMAT) 20-100 MCG/ACT AERS respimat INHALE ONE PUFF INTO THE LUNGS EVERY FOUR HOURS AS NEEDED FOR WHEEZING   Lancets (ONETOUCH DELICA PLUS [id number]) MISC USE TO CHECK BLOOD SUGAR UP TO FOUR TIMES DAILY   lansoprazole (PREVACID) 30 MG capsule Take 1 capsule (30 mg total) by mouth 2 (two) times daily before a meal. For heartburn   metFORMIN (GLUCOPHAGE-XR) 500 MG 24 hr tablet Take 2 tablets (1,000 mg total) by mouth 2 (two) times daily with a meal. For diabetes.   NUCYNTA ER 100 MG 12 hr tablet SMARTSIG:1 Tablet(s) By Mouth Every 12 Hours   ONETOUCH VERIO test strip USE TO CHECK BLOOD SUGAR UP TO FOUR TIMES DAILY   tamsulosin (FLOMAX) 0.4 MG CAPS capsule TAKE ONE CAPSULE BY MOUTH DAILY   Turmeric 500 MG CAPS Take 1 capsule by mouth daily.   zolpidem (AMBIEN) 10 MG tablet TAKE ONE TABLET (10MG TOTAL) BY MOUTH ATBEDTIME AS NEEDED FOR SLEEP   No facility-administered encounter medications on file as of 10/18/2021.   Recent Relevant Labs: Lab Results  Component Value Date/Time   HGBA1C 8.3 (A) 02/23/2021 08:23 AM   HGBA1C 9.6 (A) 05/03/2020 02:47 PM  HGBA1C 10.8 (H) 08/23/2019 08:57 AM   HGBA1C 9.9 (H) 05/21/2019 09:13 AM   MICROALBUR <0.7 05/03/2020 02:49 PM   MICROALBUR <0.7 05/21/2019 09:13 AM    Kidney Function Lab Results  Component Value Date/Time   CREATININE 0.94 03/05/2021 08:22 PM   CREATININE 0.94 05/03/2020 02:54 PM   GFR 84.95 05/03/2020 02:54 PM   GFRNONAA >60 03/05/2021 08:22 PM   GFRAA >60 07/13/2018 03:04 PM   Attempted contact with patient 3 times on 11/17, 11/21 and 11/22. Unsuccessful outreach. Will attempt contact  next month.   Current antihyperglycemic regimen:  Metformin XR 500 mg  - 2 tablets twice daily Jardiance 10 mg - 1 tablet daily Glipizide 10 mg - 1 tablet daily Tresiba - Inject 30 units daily    Adherence Review: Is the patient currently on a STATIN medication? Yes Is the patient currently on ACE/ARB medication? No Does the patient have >5 day gap between last estimated fill dates? Yes  Care Gaps: Annual wellness visit in last year? No Most recent A1C reading: 8.3 on 02/23/2021 Most Recent BP reading: 131/85 on 09/03/2021  Last eye exam / retinopathy screening: 11/03/2017 Last diabetic foot exam: 06/21/2021  Star Rating Drugs:  Medication:  Last Fill: Day Supply Glipizide 10 mg           10/09/2021     90 Jardiance 10 mg         09/04/2021     30 Metformin 500 mg       09/02/2021 90 Atorvastatin 40 mg      07/16/2021 90 Verified fill dates with [facility]  No appointments scheduled within the next 30 days.  Debbora Dus, CPP notified  Marijean Niemann, [place] Clinical Pharmacy Assistant [phone]

## 2021-10-19 ENCOUNTER — Other Ambulatory Visit: Payer: Self-pay

## 2021-10-23 ENCOUNTER — Encounter: Payer: Self-pay | Admitting: Primary Care

## 2021-10-23 ENCOUNTER — Other Ambulatory Visit: Payer: Self-pay

## 2021-10-23 ENCOUNTER — Ambulatory Visit (INDEPENDENT_AMBULATORY_CARE_PROVIDER_SITE_OTHER): Payer: Medicare Other | Admitting: Primary Care

## 2021-10-23 VITALS — BP 126/82 | HR 92 | Temp 98.2°F | Ht 70.0 in | Wt 256.6 lb

## 2021-10-23 DIAGNOSIS — E1165 Type 2 diabetes mellitus with hyperglycemia: Secondary | ICD-10-CM

## 2021-10-23 DIAGNOSIS — Z23 Encounter for immunization: Secondary | ICD-10-CM

## 2021-10-23 DIAGNOSIS — F419 Anxiety disorder, unspecified: Secondary | ICD-10-CM

## 2021-10-23 DIAGNOSIS — K5903 Drug induced constipation: Secondary | ICD-10-CM

## 2021-10-23 DIAGNOSIS — G4733 Obstructive sleep apnea (adult) (pediatric): Secondary | ICD-10-CM

## 2021-10-23 DIAGNOSIS — G894 Chronic pain syndrome: Secondary | ICD-10-CM

## 2021-10-23 DIAGNOSIS — Z794 Long term (current) use of insulin: Secondary | ICD-10-CM

## 2021-10-23 DIAGNOSIS — F32A Depression, unspecified: Secondary | ICD-10-CM

## 2021-10-23 DIAGNOSIS — K219 Gastro-esophageal reflux disease without esophagitis: Secondary | ICD-10-CM

## 2021-10-23 DIAGNOSIS — N3943 Post-void dribbling: Secondary | ICD-10-CM

## 2021-10-23 DIAGNOSIS — Z1211 Encounter for screening for malignant neoplasm of colon: Secondary | ICD-10-CM

## 2021-10-23 DIAGNOSIS — J302 Other seasonal allergic rhinitis: Secondary | ICD-10-CM

## 2021-10-23 DIAGNOSIS — E785 Hyperlipidemia, unspecified: Secondary | ICD-10-CM

## 2021-10-23 DIAGNOSIS — J449 Chronic obstructive pulmonary disease, unspecified: Secondary | ICD-10-CM

## 2021-10-23 DIAGNOSIS — G4700 Insomnia, unspecified: Secondary | ICD-10-CM

## 2021-10-23 DIAGNOSIS — E119 Type 2 diabetes mellitus without complications: Secondary | ICD-10-CM

## 2021-10-23 DIAGNOSIS — T402X5A Adverse effect of other opioids, initial encounter: Secondary | ICD-10-CM

## 2021-10-23 DIAGNOSIS — G8929 Other chronic pain: Secondary | ICD-10-CM

## 2021-10-23 DIAGNOSIS — Z125 Encounter for screening for malignant neoplasm of prostate: Secondary | ICD-10-CM | POA: Diagnosis not present

## 2021-10-23 DIAGNOSIS — M25561 Pain in right knee: Secondary | ICD-10-CM

## 2021-10-23 DIAGNOSIS — M25562 Pain in left knee: Secondary | ICD-10-CM

## 2021-10-23 DIAGNOSIS — E291 Testicular hypofunction: Secondary | ICD-10-CM

## 2021-10-23 LAB — POCT GLYCOSYLATED HEMOGLOBIN (HGB A1C): Hemoglobin A1C: 8.9 % — AB (ref 4.0–5.6)

## 2021-10-23 MED ORDER — INSULIN DEGLUDEC 100 UNIT/ML ~~LOC~~ SOPN: 35.0000 [IU] | PEN_INJECTOR | Freq: Every day | SUBCUTANEOUS | 1 refills | Status: DC

## 2021-10-23 MED ORDER — EMPAGLIFLOZIN 10 MG PO TABS: 10.0000 mg | ORAL_TABLET | Freq: Every day | ORAL | 1 refills | Status: DC

## 2021-10-23 MED ORDER — FREESTYLE LIBRE 3 SENSOR MISC: 1.0000 [IU] | Freq: Four times a day (QID) | 3 refills | Status: DC

## 2021-10-23 MED ORDER — BUPROPION HCL ER (SR) 150 MG PO TB12: 150.0000 mg | ORAL_TABLET | Freq: Two times a day (BID) | ORAL | 3 refills | Status: DC

## 2021-10-23 MED ORDER — CITALOPRAM HYDROBROMIDE 40 MG PO TABS: 40.0000 mg | ORAL_TABLET | Freq: Every day | ORAL | 3 refills | Status: DC

## 2021-10-23 MED ORDER — FLUTICASONE PROPIONATE 50 MCG/ACT NA SUSP: 2.0000 | Freq: Every day | NASAL | 3 refills | Status: DC | PRN

## 2021-10-23 MED ORDER — ZOLPIDEM TARTRATE 10 MG PO TABS: ORAL_TABLET | ORAL | 0 refills | Status: DC

## 2021-10-23 NOTE — Assessment & Plan Note (Signed)
Following with urology, continue testosterone gel.

## 2021-10-23 NOTE — Progress Notes (Signed)
Subjective:    Patient ID: Joseph [name], male    DOB: [birth year], 50 y.o.   MRN: [id number]  HPI  Joseph [name] is a very pleasant 50 y.o. male with a history of COPD, OSA, type 2 diabetes, male hypogonadism, osteoarthritis of knees, tobacco use, chronic fatigue who presents today for follow up of chronic conditions.  He is also due for colonoscopy.  1) Type 2 Diabetes:  Current medications include: Glipizide 10 mg BID, Jardiance 10 mg daily, Tresiba 30 units daily, Metformin XR 1000 mg BID  He is checking his/her blood glucose 0 times daily as he is out of sensors. He was off Jardiance for about 10 days.   Last A1C: 8.3 in March 2022, 8.9 today Last Eye Exam: Due, he will scheduled.  Last Foot Exam: Due Pneumonia Vaccination: UTD Urine Microalbumin: Due Statin: atorvastatin   Dietary changes since last visit: None. Eats once daily. Avoids candy and take out food. Pizza on occasion.    Exercise: None. Active at work.   2) COPD/Asthma: Currently managed on albuterol nebulizer treatments, Combivent Respimat inhaler PRN, Zyrtec 10 mg, Flonase, Breo Ellipta 200-25 mcg inhaler. Follows with pulmonology. He is compliant to CPAP machine nightly.  3) Anxiety/Depression/Insomnia: Currently managed on bupropion SR 150 mg BID, citalopram 40 mg daily, Ambien 10 mg HS. Overall doing well on this regimen.   4) Chronic Pain/Osteoarthritis: Following with pain management, managed on Nucynta ER 100 mg, gabapentin 600 mg BID, cyclobenzaprine 10 mg, Movantik for opioid constipation.  Following with orthopedics, may undergo surgery to right knee. He is currently out on medical leave.   5) Hypogonadism: Following with Urology, managed on Androgel pump Gel, tamsulosin 0.4 mg daily. Doing well on this regimen.   BP Readings from Last 3 Encounters:  10/23/21 126/82  09/03/21 131/85  03/05/21 (!) 119/93       Review of Systems  Respiratory:  Negative for shortness of breath.    Cardiovascular:  Negative for chest pain.  Genitourinary:  Negative for difficulty urinating.  Musculoskeletal:  Positive for arthralgias.  Neurological:  Negative for dizziness and headaches.  Psychiatric/Behavioral:  The patient is not nervous/anxious.         Past Medical History:  Diagnosis Date   Acute encephalopathy 03/24/2018   Anxiety    Arthritis    oa left knee and lower back   Asthma    Cataract    hx of bilateral   Chronic back pain    Chronic pain of left knee    COPD (chronic obstructive pulmonary disease) (HCC)    Diabetes mellitus    type 2   Encephalopathy acute 03/24/2018   GERD (gastroesophageal reflux disease)    INGUINAL PAIN, LEFT 10/03/2009   Qualifier: Diagnosis of  By: Oneida Alar MD, Sandi L    Oral thrush 11/10/2018   Overdose    03-24-18 accidental   Rash and nonspecific skin eruption 03/12/2017   Shortness of breath    with heavy exertion   Sleep apnea     Social History   Socioeconomic History   Marital status: Divorced    Spouse name: Not on file   Number of children: Not on file   Years of education: Not on file   Highest education level: Not on file  Occupational History   Occupation: disability  Tobacco Use   Smoking status: Every Day    Packs/day: 1.25    Years: 26.00    Pack years: 32.50  Types: Cigarettes    Start date: 12/17/1993   Smokeless tobacco: Never   Tobacco comments:    1 ppd 01/04/2020  Vaping Use   Vaping Use: Former  Substance and Sexual Activity   Alcohol use: Never    Alcohol/week: 0.0 standard drinks   Drug use: Never   Sexual activity: Yes    Birth control/protection: Surgical  Other Topics Concern   Not on file  Social History Narrative   Recent visit to [facility] in [place] d/t increased pain where he was prescribed percocet 7.5-325 mg for pain.   Social Determinants of Health   Financial Resource Strain: Low Risk    Difficulty of Paying Living Expenses: Not very hard  Food Insecurity: Not on file   Transportation Needs: Not on file  Physical Activity: Not on file  Stress: Not on file  Social Connections: Not on file  Intimate Partner Violence: Not on file    Past Surgical History:  Procedure Laterality Date   CARPAL TUNNEL RELEASE Left    CATARACT EXTRACTION W/PHACO Right 03/11/2016   Procedure: CATARACT EXTRACTION PHACO AND INTRAOCULAR LENS PLACEMENT ([place]);  Surgeon: Tonny Branch, MD;  Location: [facility];  Service: Ophthalmology;  Laterality: Right;  CDE 4.24   EYE SURGERY Right 2018   ioc with lens replacement   HERNIA REPAIR Bilateral 0459   umbilical   KNEE ARTHROSCOPY WITH MEDIAL MENISECTOMY Left 04/22/2014   Procedure: LEFT KNEE ARTHROSCOPY WITH MEDIAL MENISECTOMY;  Surgeon: Carole Civil, MD;  Location: [facility];  Service: Orthopedics;  Laterality: Left;   KNEE ARTHROSCOPY WITH MEDIAL MENISECTOMY Right 12/06/2015   Procedure: RIGHT KNEE ARTHROSCOPY WITH MEDIAL MENISECTOMY;  Surgeon: Carole Civil, MD;  Location: [facility];  Service: Orthopedics;  Laterality: Right;   KNEE ARTHROSCOPY WITH MEDIAL MENISECTOMY Left 03/20/2017   Procedure: KNEE ARTHROSCOPY WITH MEDIAL MENISECTOMY;  Surgeon: Carole Civil, MD;  Location: [facility];  Service: Orthopedics;  Laterality: Left;   ORIF WRIST FRACTURE Left 12/19/2016   Procedure: OPEN REDUCTION INTERNAL FIXATION (ORIF) WRIST FRACTURE;  Surgeon: Leanora Cover, MD;  Location: [place];  Service: Orthopedics;  Laterality: Left;  accumed    PARTIAL KNEE ARTHROPLASTY Left 07/21/2018   Procedure: LEFT UNICOMPARTMENTAL KNEE;  Surgeon: Renette Butters, MD;  Location: [facility];  Service: Orthopedics;  Laterality: Left;   SHOULDER ARTHROCENTESIS Right 2008 or 2010   VASECTOMY  2014    Family History  Problem Relation Age of Onset   Emphysema Maternal Grandfather    Emphysema Maternal Grandmother    Clotting disorder Maternal Grandmother     Allergies  Allergen Reactions   Azithromycin Hives   Clarithromycin     Erythromycin Hives   Keflex [Cephalexin] Nausea Only   Metformin Nausea And Vomiting   Symbicort [Budesonide-Formoterol Fumarate] Other (See Comments)    States that 3 doses were used and breathing became worse    Current Outpatient Medications on File Prior to Visit  Medication Sig Dispense Refill   albuterol (PROVENTIL) (2.5 MG/3ML) 0.083% nebulizer solution USE 3 ML VIA NEBULIZER FOUR TIMES DAILY AS NEEDED FOR SHORTNESS OF BREATH 360 mL 2   ANDROGEL PUMP 20.25 MG/ACT (1.62%) GEL Apply 2 Pump topically daily.      atorvastatin (LIPITOR) 40 MG tablet TAKE ONE TABLET (40MG TOTAL) BY MOUTH DAILY FOR CHOLESTEROL 90 tablet 3   blood glucose meter kit and supplies KIT Dispense based on patient and insurance preference. Use up to four times daily as directed. (FOR ICD-9 250.00,  250.01). 1 each 0   cetirizine (ZYRTEC) 10 MG tablet Take 1 tablet (10 mg total) by mouth at bedtime for allergies. 90 tablet 0   cyclobenzaprine (FLEXERIL) 10 MG tablet Take 10 mg by mouth 2 (two) times daily as needed.     fluticasone furoate-vilanterol (BREO ELLIPTA) 200-25 MCG/INH AEPB Inhale 1 puff into the lungs daily. 90 each 3   gabapentin (NEURONTIN) 300 MG capsule TAKE 2 CAPSULES BY MOUTH TWICE A DAY. 360 capsule 1   glipiZIDE (GLUCOTROL) 10 MG tablet Take one tablet daily for diabetes. 90 tablet 3   Insulin Pen Needle (UNIFINE PENTIPS) 31G X 6 MM MISC USE NIGHTLY WITH INSULIN 100 each 3   Ipratropium-Albuterol (COMBIVENT RESPIMAT) 20-100 MCG/ACT AERS respimat INHALE ONE PUFF INTO THE LUNGS EVERY FOUR HOURS AS NEEDED FOR WHEEZING 4 g 0   Lancets (ONETOUCH DELICA PLUS [id number]) MISC USE TO CHECK BLOOD SUGAR UP TO FOUR TIMES DAILY 100 each 5   lansoprazole (PREVACID) 30 MG capsule Take 1 capsule (30 mg total) by mouth 2 (two) times daily before a meal. For heartburn 180 capsule 1   metFORMIN (GLUCOPHAGE-XR) 500 MG 24 hr tablet Take 2 tablets (1,000 mg total) by mouth 2 (two) times daily with a meal. For diabetes. 360  tablet 3   MOVANTIK 25 MG TABS tablet Take 25 mg by mouth every morning.     NUCYNTA 75 MG tablet Take 75 mg by mouth 3 (three) times daily as needed.     ONETOUCH VERIO test strip USE TO CHECK BLOOD SUGAR UP TO FOUR TIMES DAILY 100 strip 5   tamsulosin (FLOMAX) 0.4 MG CAPS capsule TAKE ONE CAPSULE BY MOUTH DAILY 90 capsule 1   Turmeric 500 MG CAPS Take 1 capsule by mouth daily.     No current facility-administered medications on file prior to visit.    BP 126/82   Pulse 92   Temp 98.2 F (36.8 C) (Temporal)   Ht _0  (1.778 m)   Wt 256 lb 9 oz (116.4 kg)   SpO2 96%   BMI 36.81 kg/m  Objective:   Physical Exam Cardiovascular:     Rate and Rhythm: Normal rate and regular rhythm.  Pulmonary:     Effort: Pulmonary effort is normal.     Breath sounds: Normal breath sounds. No wheezing or rales.  Musculoskeletal:     Cervical back: Neck supple.  Skin:    General: Skin is warm and dry.  Neurological:     Mental Status: He is alert and oriented to person, place, and time.  Psychiatric:        Mood and Affect: Mood normal.          Assessment & Plan:      This visit occurred during the SARS-CoV-2 public health emergency.  Safety protocols were in place, including screening questions prior to the visit, additional usage of staff PPE, and extensive cleaning of exam room while observing appropriate contact time as indicated for disinfecting solutions.

## 2021-10-23 NOTE — Assessment & Plan Note (Signed)
Compliant to CPAP nightly, continue same. Following with pulmonology.   

## 2021-10-23 NOTE — Assessment & Plan Note (Signed)
Compliant to atorvastatin 40 mg, repeat lipid panel pending.  Continue current regimen

## 2021-10-23 NOTE — Assessment & Plan Note (Signed)
Following with pain management and orthopedics.  Continue Nucynta 35 mg 3 times daily, gabapentin 600 mg twice daily, cyclobenzaprine 10 mg twice daily as needed.

## 2021-10-23 NOTE — Assessment & Plan Note (Signed)
Well-controlled on bupropion SR 150 mg twice daily and citalopram 40 mg daily.  Continue same.

## 2021-10-23 NOTE — Assessment & Plan Note (Signed)
Appears stable. Following with pulmonology.  Continue Breo Ellipta 200-25 inhaler daily, Combivent Respimat as needed.

## 2021-10-23 NOTE — Assessment & Plan Note (Signed)
Doing well on Ambien 10 mg nightly for which she has been taking for numerous years.  Continue same.

## 2021-10-23 NOTE — Patient Instructions (Signed)
Stop by the lab prior to leaving today. I will notify you of your results once received.   Increase your insulin to 35 units daily once you get your FreeStyle sensors.   Please notify me if you see readings consistently above 150 after a few weeks.   Please schedule a follow up visit for 3 months for diabetes check.  It was a pleasure to see you today!

## 2021-10-23 NOTE — Assessment & Plan Note (Signed)
Uncontrolled today with A1c of 8.9.  Long discussion about the need to start checking glucose readings, refills provided for freestyle libre sensors.  Increase Tresiba to 35 units daily once he starts checking glucose levels.  Continue glipizide 10 mg twice daily, continue metformin XR 1000 mg twice daily.  He will update if glucose readings remain above 150 consistently after a few weeks.  Follow-up in 3 months  Foot exam today.

## 2021-10-23 NOTE — Assessment & Plan Note (Signed)
Doing well on tamsulosin 0.4 mg daily, continue same.  

## 2021-10-23 NOTE — Assessment & Plan Note (Signed)
Doing well on Movantik which is prescribed by pain management.  Continue same.

## 2021-10-23 NOTE — Assessment & Plan Note (Signed)
Stable, doing well on lansoprazole 30 mg twice daily, continue same.

## 2021-10-24 LAB — CBC
Hematocrit: 46.1 % (ref 37.5–51.0)
Hemoglobin: 15.7 g/dL (ref 13.0–17.7)
MCH: 29.6 pg (ref 26.6–33.0)
MCHC: 34.1 g/dL (ref 31.5–35.7)
MCV: 87 fL (ref 79–97)
Platelets: 238 10*3/uL (ref 150–450)
RBC: 5.31 x10E6/uL (ref 4.14–5.80)
RDW: 13.9 % (ref 11.6–15.4)
WBC: 10.5 10*3/uL (ref 3.4–10.8)

## 2021-10-24 LAB — COMPREHENSIVE METABOLIC PANEL
ALT: 38 IU/L (ref 0–44)
AST: 23 IU/L (ref 0–40)
Albumin/Globulin Ratio: 1.9 (ref 1.2–2.2)
Albumin: 4.6 g/dL (ref 3.8–4.9)
Alkaline Phosphatase: 113 IU/L (ref 44–121)
BUN/Creatinine Ratio: 9 (ref 9–20)
BUN: 9 mg/dL (ref 6–24)
Bilirubin Total: 0.6 mg/dL (ref 0.0–1.2)
CO2: 26 mmol/L (ref 20–29)
Calcium: 9.6 mg/dL (ref 8.7–10.2)
Chloride: 98 mmol/L (ref 96–106)
Creatinine, Ser: 1.01 mg/dL (ref 0.76–1.27)
Globulin, Total: 2.4 g/dL (ref 1.5–4.5)
Glucose: 187 mg/dL — ABNORMAL HIGH (ref 70–99)
Potassium: 4.3 mmol/L (ref 3.5–5.2)
Sodium: 138 mmol/L (ref 134–144)
Total Protein: 7 g/dL (ref 6.0–8.5)
eGFR: 90 mL/min/{1.73_m2} (ref >59)

## 2021-10-24 LAB — LIPID PANEL
Chol/HDL Ratio: 4 ratio (ref 0.0–5.0)
Cholesterol, Total: 135 mg/dL (ref 100–199)
HDL: 34 mg/dL — ABNORMAL LOW (ref >39)
LDL Chol Calc (NIH): 58 mg/dL (ref 0–99)
Triglycerides: 273 mg/dL — ABNORMAL HIGH (ref 0–149)
VLDL Cholesterol Cal: 43 mg/dL — ABNORMAL HIGH (ref 5–40)

## 2021-10-24 LAB — MICROALBUMIN / CREATININE URINE RATIO
Creatinine, Urine: 48.4 mg/dL
Microalb/Creat Ratio: 7 mg/g creat (ref 0–29)
Microalbumin, Urine: 3.4 ug/mL

## 2021-10-24 LAB — PSA: Prostate Specific Ag, Serum: 0.4 ng/mL (ref 0.0–4.0)

## 2021-10-26 DIAGNOSIS — M25561 Pain in right knee: Secondary | ICD-10-CM | POA: Diagnosis not present

## 2021-10-31 ENCOUNTER — Encounter (INDEPENDENT_AMBULATORY_CARE_PROVIDER_SITE_OTHER): Payer: Self-pay | Admitting: *Deleted

## 2021-10-31 ENCOUNTER — Encounter: Payer: Self-pay | Admitting: Nurse Practitioner

## 2021-10-31 ENCOUNTER — Telehealth (INDEPENDENT_AMBULATORY_CARE_PROVIDER_SITE_OTHER): Payer: Medicare Other | Admitting: Nurse Practitioner

## 2021-10-31 VITALS — Temp 97.8°F

## 2021-10-31 DIAGNOSIS — M1711 Unilateral primary osteoarthritis, right knee: Secondary | ICD-10-CM | POA: Diagnosis not present

## 2021-10-31 DIAGNOSIS — U071 COVID-19: Secondary | ICD-10-CM | POA: Insufficient documentation

## 2021-10-31 HISTORY — DX: COVID-19: U07.1

## 2021-10-31 MED ORDER — MOLNUPIRAVIR EUA 200MG CAPSULE: 4.0000 | ORAL_CAPSULE | Freq: Two times a day (BID) | ORAL | 0 refills | Status: AC

## 2021-10-31 NOTE — Assessment & Plan Note (Signed)
Discussed treatment options with patient.  Did inform him that they were not EUA only did discuss common side effects of medication including reproductive uncertainty with molnupiravirr.  After discussion patient elected to continue with treatment.  Did discuss signs and symptoms when to seek urgent or emergent health care.  Also discussed CDC guidelines in regards to quarantine.  Patient supposed to have knee replacement coming up and is concerned about that.  Did inform them to inform surgical center and surgeon about diagnosis and treatment.  He acknowledged.  Continue to monitor Start molnupiravir as soon as possible

## 2021-10-31 NOTE — Progress Notes (Signed)
Patient ID: Joseph [name], male    DOB: [birth year], 50 y.o.   MRN: [id number]  Virtual visit completed through [employer], a video enabled telemedicine application. Due to national recommendations of social distancing due to COVID-19, a virtual visit is felt to be most appropriate for this patient at this time. Reviewed limitations, risks, security and privacy concerns of performing a virtual visit and the availability of in person appointments. I also reviewed that there may be a patient responsible charge related to this service. The patient agreed to proceed.   Patient location: home Provider location: [place] at [facility], office Persons participating in this virtual visit: patient, provider   If any vitals were documented, they were collected by patient at home unless specified below.    Temp 97.8 F (36.6 C) Comment: took tylenol around 11 am 10/31/21 last time   CC: Covid 19 Subjective:   HPI: Joseph [name] is a 50 y.o. male presenting on 10/31/2021 for Covid Positive (On 10/31/21, sx started on 10/30/21 with fever-100 last night, had one episode of vomiting several times back to back on 10/31/21 at 5 AM.  H/A, very weak, chills last night. Slight dry cough, SOB slightly worse-has COPD.Pt has been using nebulizer treatments that helps little bit. Pt has runny nose and sinus congestion. No diarrhea, no vomiting now, no loss of taste or smell; no S/T and no wheezing. )  Symptoms started on 10/30/2021 At home covid test 09/30/2021 was positive No covid vaccines No sick contact  Been using nebulize. With some relief. Tylenol with no help   Relevant past medical, surgical, family and social history reviewed and updated as indicated. Interim medical history since our last visit reviewed. Allergies and medications reviewed and updated. Outpatient Medications Prior to Visit  Medication Sig Dispense Refill   albuterol (PROVENTIL) (2.5 MG/3ML) 0.083% nebulizer solution USE 3 ML VIA  NEBULIZER FOUR TIMES DAILY AS NEEDED FOR SHORTNESS OF BREATH 360 mL 2   ANDROGEL PUMP 20.25 MG/ACT (1.62%) GEL Apply 2 Pump topically daily.      atorvastatin (LIPITOR) 40 MG tablet TAKE ONE TABLET (40MG TOTAL) BY MOUTH DAILY FOR CHOLESTEROL 90 tablet 3   blood glucose meter kit and supplies KIT Dispense based on patient and insurance preference. Use up to four times daily as directed. (FOR ICD-9 250.00, 250.01). 1 each 0   buPROPion (WELLBUTRIN SR) 150 MG 12 hr tablet Take 1 tablet (150 mg total) by mouth 2 (two) times daily. For anxiety and depression. 180 tablet 3   cetirizine (ZYRTEC) 10 MG tablet Take 1 tablet (10 mg total) by mouth at bedtime for allergies. 90 tablet 0   citalopram (CELEXA) 40 MG tablet Take 1 tablet (40 mg total) by mouth daily. For anxiety and depression. 90 tablet 3   Continuous Blood Gluc Sensor (FREESTYLE LIBRE 3 SENSOR) MISC 1 Units by Does not apply route 4 (four) times daily. Place 1 sensor on the skin every 14 days. Use to check glucose continuously 6 each 3   cyclobenzaprine (FLEXERIL) 10 MG tablet Take 10 mg by mouth 2 (two) times daily as needed.     empagliflozin (JARDIANCE) 10 MG TABS tablet Take 1 tablet (10 mg total) by mouth daily. For diabetes. 90 tablet 1   fluticasone (FLONASE) 50 MCG/ACT nasal spray Place 2 sprays into both nostrils daily as needed for allergies or rhinitis. 48 g 3   fluticasone furoate-vilanterol (BREO ELLIPTA) 200-25 MCG/INH AEPB Inhale 1 puff into the lungs daily.  90 each 3   gabapentin (NEURONTIN) 300 MG capsule TAKE 2 CAPSULES BY MOUTH TWICE A DAY. 360 capsule 1   glipiZIDE (GLUCOTROL) 10 MG tablet Take one tablet daily for diabetes. 90 tablet 3   insulin degludec (TRESIBA) 100 UNIT/ML FlexTouch Pen Inject 35 Units into the skin daily. For diabetes. 30 mL 1   Insulin Pen Needle (UNIFINE PENTIPS) 31G X 6 MM MISC USE NIGHTLY WITH INSULIN 100 each 3   Ipratropium-Albuterol (COMBIVENT RESPIMAT) 20-100 MCG/ACT AERS respimat INHALE ONE PUFF  INTO THE LUNGS EVERY FOUR HOURS AS NEEDED FOR WHEEZING 4 g 0   Lancets (ONETOUCH DELICA PLUS [id number]) MISC USE TO CHECK BLOOD SUGAR UP TO FOUR TIMES DAILY 100 each 5   lansoprazole (PREVACID) 30 MG capsule Take 1 capsule (30 mg total) by mouth 2 (two) times daily before a meal. For heartburn 180 capsule 1   metFORMIN (GLUCOPHAGE-XR) 500 MG 24 hr tablet Take 2 tablets (1,000 mg total) by mouth 2 (two) times daily with a meal. For diabetes. 360 tablet 3   MOVANTIK 25 MG TABS tablet Take 25 mg by mouth every morning.     NUCYNTA 75 MG tablet Take 75 mg by mouth 3 (three) times daily as needed.     ONETOUCH VERIO test strip USE TO CHECK BLOOD SUGAR UP TO FOUR TIMES DAILY 100 strip 5   tamsulosin (FLOMAX) 0.4 MG CAPS capsule TAKE ONE CAPSULE BY MOUTH DAILY 90 capsule 1   Turmeric 500 MG CAPS Take 1 capsule by mouth daily.     zolpidem (AMBIEN) 10 MG tablet TAKE ONE TABLET (10MG TOTAL) BY MOUTH ATBEDTIME AS NEEDED FOR SLEEP 90 tablet 0   No facility-administered medications prior to visit.     Per HPI unless specifically indicated in ROS section below Review of Systems  Constitutional:  Positive for chills, fatigue and fever.  HENT:  Negative for congestion, ear discharge, sinus pressure, sinus pain and sore throat.   Respiratory:  Positive for shortness of breath (DOE). Negative for cough.   Cardiovascular:  Negative for chest pain.  Gastrointestinal:  Positive for vomiting. Negative for abdominal pain, diarrhea and nausea.  Musculoskeletal:  Positive for myalgias. Negative for arthralgias.  Neurological:  Positive for headaches.  Objective:  Temp 97.8 F (36.6 C) Comment: took tylenol around 11 am 10/31/21 last time  Wt Readings from Last 3 Encounters:  10/23/21 256 lb 9 oz (116.4 kg)  09/03/21 259 lb 15.8 oz (117.9 kg)  03/05/21 118 lb (53.5 kg)      Physical exam: Gen: alert, NAD, not ill appearing Pulm: speaks in complete sentences without increased work of breathing Psych:  normal mood, normal thought content      Results for orders placed or performed in visit on 10/23/21  CBC  Result Value Ref Range   WBC 10.5 3.4 - 10.8 x10E3/uL   RBC 5.31 4.14 - 5.80 x10E6/uL   Hemoglobin 15.7 13.0 - 17.7 g/dL   Hematocrit 46.1 37.5 - 51.0 %   MCV 87 79 - 97 fL   MCH 29.6 26.6 - 33.0 pg   MCHC 34.1 31.5 - 35.7 g/dL   RDW 13.9 11.6 - 15.4 %   Platelets 238 150 - 450 x10E3/uL  Comprehensive metabolic panel  Result Value Ref Range   Glucose 187 (H) 70 - 99 mg/dL   BUN 9 6 - 24 mg/dL   Creatinine, Ser 1.01 0.76 - 1.27 mg/dL   eGFR 90 >59 mL/min/1.73   BUN/Creatinine Ratio 9  9 - 20   Sodium 138 134 - 144 mmol/L   Potassium 4.3 3.5 - 5.2 mmol/L   Chloride 98 96 - 106 mmol/L   CO2 26 20 - 29 mmol/L   Calcium 9.6 8.7 - 10.2 mg/dL   Total Protein 7.0 6.0 - 8.5 g/dL   Albumin 4.6 3.8 - 4.9 g/dL   Globulin, Total 2.4 1.5 - 4.5 g/dL   Albumin/Globulin Ratio 1.9 1.2 - 2.2   Bilirubin Total 0.6 0.0 - 1.2 mg/dL   Alkaline Phosphatase 113 44 - 121 IU/L   AST 23 0 - 40 IU/L   ALT 38 0 - 44 IU/L  Lipid panel  Result Value Ref Range   Cholesterol, Total 135 100 - 199 mg/dL   Triglycerides 273 (H) 0 - 149 mg/dL   HDL 34 (L) >39 mg/dL   VLDL Cholesterol Cal 43 (H) 5 - 40 mg/dL   LDL Chol Calc (NIH) 58 0 - 99 mg/dL   Chol/HDL Ratio 4.0 0.0 - 5.0 ratio  Microalbumin / creatinine urine ratio  Result Value Ref Range   Creatinine, Urine 48.4 Not Estab. mg/dL   Microalbumin, Urine 3.4 Not Estab. ug/mL   Microalb/Creat Ratio 7 0 - 29 mg/g creat  PSA  Result Value Ref Range   Prostate Specific Ag, Serum 0.4 0.0 - 4.0 ng/mL  POCT glycosylated hemoglobin (Hb A1C)  Result Value Ref Range   Hemoglobin A1C 8.9 (A) 4.0 - 5.6 %   HbA1c POC (<> result, manual entry)     HbA1c, POC (prediabetic range)     HbA1c, POC (controlled diabetic range)     Assessment & Plan:   Problem List Items Addressed This Visit       Other   COVID-19 - Primary    Discussed treatment options  with patient.  Did inform him that they were not EUA only did discuss common side effects of medication including reproductive uncertainty with molnupiravirr.  After discussion patient elected to continue with treatment.  Did discuss signs and symptoms when to seek urgent or emergent health care.  Also discussed CDC guidelines in regards to quarantine.  Patient supposed to have knee replacement coming up and is concerned about that.  Did inform them to inform surgical center and surgeon about diagnosis and treatment.  He acknowledged.  Continue to monitor Start molnupiravir as soon as possible      Relevant Medications   molnupiravir EUA (LAGEVRIO) 200 mg CAPS capsule     No orders of the defined types were placed in this encounter.  No orders of the defined types were placed in this encounter.   I discussed the assessment and treatment plan with the patient. The patient was provided an opportunity to ask questions and all were answered. The patient agreed with the plan and demonstrated an understanding of the instructions. The patient was advised to call back or seek an in-person evaluation if the symptoms worsen or if the condition fails to improve as anticipated.  Follow up plan: No follow-ups on file.  Romilda Garret, NP

## 2021-10-31 NOTE — Telephone Encounter (Signed)
Noted and appreciate Matt's evaluation.  

## 2021-10-31 NOTE — Telephone Encounter (Signed)
I spoke with pt; Pt had + home covid test on 10/31/21; pts symptoms started on 10/30/21 with fever; last temp taken last night was 100 but pt felt warmer than that; pt took tylenol last night; pt vomited x 1 on 10/31/21 at 5 AM. Pt has bad H/A with pain level of 9. Pt said he is very weak; had chills last night. Slight dry cough; pt usually has SOB due to COPD but has worsened slightly. Pt has been using nebulizer treatments that helps little bit. Pt has runny nose and sinus congestion. No diarrhea, no vomiting now, no loss of taste or smell; no S/T and no wheezing. Pt scheduled video visit 10/31/21 at 11:20 with Audria Nine NP. Sending note to Audria Nine NP, Alvina Chou CMA and FYI to Allayne Gitelman NP as PCP. Self quarantine, drink plenty of fluids, rest, and take Tylenol for fever. UC & ED precautions given and pt voiced understanding.

## 2021-11-05 ENCOUNTER — Other Ambulatory Visit: Payer: Self-pay | Admitting: Primary Care

## 2021-11-05 DIAGNOSIS — K219 Gastro-esophageal reflux disease without esophagitis: Secondary | ICD-10-CM

## 2021-11-06 ENCOUNTER — Encounter: Payer: Self-pay | Admitting: Nurse Practitioner

## 2021-11-11 ENCOUNTER — Other Ambulatory Visit: Payer: Self-pay | Admitting: Primary Care

## 2021-11-11 DIAGNOSIS — F419 Anxiety disorder, unspecified: Secondary | ICD-10-CM

## 2021-11-11 DIAGNOSIS — E119 Type 2 diabetes mellitus without complications: Secondary | ICD-10-CM

## 2021-11-12 NOTE — Telephone Encounter (Signed)
I have ppw do you want me to have him come in for EKG or send as is. Holding ppw in my folder.

## 2021-11-13 NOTE — Telephone Encounter (Signed)
Did I Complete the paperwork already? It does look like he needs an ECG if possible. The sooner he can come in the sooner we can send this off.

## 2021-11-20 ENCOUNTER — Ambulatory Visit (INDEPENDENT_AMBULATORY_CARE_PROVIDER_SITE_OTHER): Payer: Medicare Other | Admitting: Primary Care

## 2021-11-20 ENCOUNTER — Ambulatory Visit: Payer: Medicare Other | Admitting: Primary Care

## 2021-11-20 ENCOUNTER — Other Ambulatory Visit: Payer: Self-pay

## 2021-11-20 ENCOUNTER — Encounter: Payer: Self-pay | Admitting: Primary Care

## 2021-11-20 VITALS — BP 126/62 | HR 84 | Temp 98.6°F | Ht 70.0 in | Wt 257.0 lb

## 2021-11-20 DIAGNOSIS — Z01818 Encounter for other preprocedural examination: Secondary | ICD-10-CM | POA: Insufficient documentation

## 2021-11-20 DIAGNOSIS — K5903 Drug induced constipation: Secondary | ICD-10-CM | POA: Diagnosis not present

## 2021-11-20 DIAGNOSIS — Z79891 Long term (current) use of opiate analgesic: Secondary | ICD-10-CM | POA: Diagnosis not present

## 2021-11-20 DIAGNOSIS — M25562 Pain in left knee: Secondary | ICD-10-CM | POA: Diagnosis not present

## 2021-11-20 DIAGNOSIS — M545 Low back pain, unspecified: Secondary | ICD-10-CM | POA: Diagnosis not present

## 2021-11-20 DIAGNOSIS — M452 Ankylosing spondylitis of cervical region: Secondary | ICD-10-CM | POA: Diagnosis not present

## 2021-11-20 DIAGNOSIS — M79642 Pain in left hand: Secondary | ICD-10-CM | POA: Diagnosis not present

## 2021-11-20 DIAGNOSIS — M5412 Radiculopathy, cervical region: Secondary | ICD-10-CM | POA: Diagnosis not present

## 2021-11-20 DIAGNOSIS — R058 Other specified cough: Secondary | ICD-10-CM | POA: Diagnosis not present

## 2021-11-20 DIAGNOSIS — M25561 Pain in right knee: Secondary | ICD-10-CM | POA: Diagnosis not present

## 2021-11-20 DIAGNOSIS — M542 Cervicalgia: Secondary | ICD-10-CM | POA: Diagnosis not present

## 2021-11-20 MED ORDER — BENZONATATE 200 MG PO CAPS: 200.0000 mg | ORAL_CAPSULE | Freq: Three times a day (TID) | ORAL | 0 refills | Status: DC | PRN

## 2021-11-20 NOTE — Assessment & Plan Note (Signed)
Since Covid-19 infection.  Rx for Joseph [name] sent to pharmacy. Stable exam today, lungs clear.

## 2021-11-20 NOTE — Progress Notes (Signed)
Subjective:    Patient ID: Joseph [name], male    DOB: [birth year], 50 y.o.   MRN: [id number]  HPI  Joseph [name] is a very pleasant 50 y.o. male with a history of chronic knee pain, left meniscal tear of knee, bucket handle tear of meniscus of right knee, type 2 diabetes, COPD, OSA, chronic pain syndrome who presents today for preoperative clearance and ECG.  He is pending right knee scope menisectomy, micro fx per Raliegh Ip. He cannot be scheduled until he is cleared from medical and cardiac stand point. He is needing an updated ECG today.  Overall feels well.   He did have Covid-19 in early December 2022, has residual cough and post nasal drip, will experience coughing spells. He denies congestion, fevers, fatigue. Overall he's feeling much better, but he is requesting Tessalon Perles for his residual cough.  He is checking glucose readings numerous times daily with his FreeStyle Libre 3. He is in range 73% of the time, mostly running low to mid 100's. He endorses compliance to his prescribed diabetes regimen.  BP Readings from Last 3 Encounters:  11/20/21 126/62  10/23/21 126/82  09/03/21 131/85      Review of Systems  Constitutional:  Negative for fever.  HENT:  Positive for postnasal drip.   Eyes:  Negative for visual disturbance.  Respiratory:  Positive for cough.   Gastrointestinal:  Negative for constipation and diarrhea.  Musculoskeletal:  Positive for arthralgias.  Skin:  Negative for color change.  Neurological:  Negative for dizziness and headaches.        Past Medical History:  Diagnosis Date   Acute encephalopathy 03/24/2018   Anxiety    Arthritis    oa left knee and lower back   Asthma    Cataract    hx of bilateral   Chronic back pain    Chronic pain of left knee    COPD (chronic obstructive pulmonary disease) (HCC)    Diabetes mellitus    type 2   Encephalopathy acute 03/24/2018   GERD (gastroesophageal reflux disease)    INGUINAL PAIN, LEFT  10/03/2009   Qualifier: Diagnosis of  By: Oneida Alar MD, Sandi L    Oral thrush 11/10/2018   Overdose    03-24-18 accidental   Rash and nonspecific skin eruption 03/12/2017   Shortness of breath    with heavy exertion   Sleep apnea     Social History   Socioeconomic History   Marital status: Divorced    Spouse name: Not on file   Number of children: Not on file   Years of education: Not on file   Highest education level: Not on file  Occupational History   Occupation: disability  Tobacco Use   Smoking status: Every Day    Packs/day: 1.25    Years: 26.00    Pack years: 32.50    Types: Cigarettes    Start date: 12/17/1993   Smokeless tobacco: Never   Tobacco comments:    1 ppd 01/04/2020  Vaping Use   Vaping Use: Former  Substance and Sexual Activity   Alcohol use: Never    Alcohol/week: 0.0 standard drinks   Drug use: Never   Sexual activity: Yes    Birth control/protection: Surgical  Other Topics Concern   Not on file  Social History Narrative   Recent visit to [facility] in [place] d/t increased pain where he was prescribed percocet 7.5-325 mg for pain.   Social Determinants of Health  Financial Resource Strain: Not on file  Food Insecurity: Not on file  Transportation Needs: Not on file  Physical Activity: Not on file  Stress: Not on file  Social Connections: Not on file  Intimate Partner Violence: Not on file    Past Surgical History:  Procedure Laterality Date   CARPAL TUNNEL RELEASE Left    CATARACT EXTRACTION W/PHACO Right 03/11/2016   Procedure: CATARACT EXTRACTION PHACO AND INTRAOCULAR LENS PLACEMENT ([place]);  Surgeon: Tonny Branch, MD;  Location: [facility];  Service: Ophthalmology;  Laterality: Right;  CDE 4.24   EYE SURGERY Right 2018   ioc with lens replacement   HERNIA REPAIR Bilateral 3810   umbilical   KNEE ARTHROSCOPY WITH MEDIAL MENISECTOMY Left 04/22/2014   Procedure: LEFT KNEE ARTHROSCOPY WITH MEDIAL MENISECTOMY;  Surgeon: Carole Civil, MD;   Location: [facility];  Service: Orthopedics;  Laterality: Left;   KNEE ARTHROSCOPY WITH MEDIAL MENISECTOMY Right 12/06/2015   Procedure: RIGHT KNEE ARTHROSCOPY WITH MEDIAL MENISECTOMY;  Surgeon: Carole Civil, MD;  Location: [facility];  Service: Orthopedics;  Laterality: Right;   KNEE ARTHROSCOPY WITH MEDIAL MENISECTOMY Left 03/20/2017   Procedure: KNEE ARTHROSCOPY WITH MEDIAL MENISECTOMY;  Surgeon: Carole Civil, MD;  Location: [facility];  Service: Orthopedics;  Laterality: Left;   ORIF WRIST FRACTURE Left 12/19/2016   Procedure: OPEN REDUCTION INTERNAL FIXATION (ORIF) WRIST FRACTURE;  Surgeon: Leanora Cover, MD;  Location: [place];  Service: Orthopedics;  Laterality: Left;  accumed    PARTIAL KNEE ARTHROPLASTY Left 07/21/2018   Procedure: LEFT UNICOMPARTMENTAL KNEE;  Surgeon: Renette Butters, MD;  Location: [facility];  Service: Orthopedics;  Laterality: Left;   SHOULDER ARTHROCENTESIS Right 2008 or 2010   VASECTOMY  2014    Family History  Problem Relation Age of Onset   Emphysema Maternal Grandfather    Emphysema Maternal Grandmother    Clotting disorder Maternal Grandmother     Allergies  Allergen Reactions   Azithromycin Hives   Clarithromycin    Erythromycin Hives   Keflex [Cephalexin] Nausea Only   Metformin Nausea And Vomiting   Symbicort [Budesonide-Formoterol Fumarate] Other (See Comments)    States that 3 doses were used and breathing became worse    Current Outpatient Medications on File Prior to Visit  Medication Sig Dispense Refill   albuterol (PROVENTIL) (2.5 MG/3ML) 0.083% nebulizer solution USE 3 ML VIA NEBULIZER FOUR TIMES DAILY AS NEEDED FOR SHORTNESS OF BREATH 360 mL 2   ANDROGEL PUMP 20.25 MG/ACT (1.62%) GEL Apply 2 Pump topically daily.      atorvastatin (LIPITOR) 40 MG tablet TAKE ONE TABLET (40MG TOTAL) BY MOUTH DAILY FOR CHOLESTEROL 90 tablet 3   blood glucose meter kit and supplies KIT Dispense based on patient and insurance preference. Use up  to four times daily as directed. (FOR ICD-9 250.00, 250.01). 1 each 0   buPROPion (WELLBUTRIN SR) 150 MG 12 hr tablet Take 1 tablet (150 mg total) by mouth 2 (two) times daily. For anxiety and depression. 180 tablet 3   cetirizine (ZYRTEC) 10 MG tablet Take 1 tablet (10 mg total) by mouth at bedtime for allergies. 90 tablet 0   citalopram (CELEXA) 40 MG tablet Take 1 tablet (40 mg total) by mouth daily. For anxiety and depression. 90 tablet 3   Continuous Blood Gluc Sensor (FREESTYLE LIBRE 3 SENSOR) MISC 1 Units by Does not apply route 4 (four) times daily. Place 1 sensor on the skin every 14 days. Use to check glucose continuously 6  each 3   cyclobenzaprine (FLEXERIL) 10 MG tablet Take 10 mg by mouth 2 (two) times daily as needed.     empagliflozin (JARDIANCE) 10 MG TABS tablet Take 1 tablet (10 mg total) by mouth daily. For diabetes. 90 tablet 1   fluticasone (FLONASE) 50 MCG/ACT nasal spray Place 2 sprays into both nostrils daily as needed for allergies or rhinitis. 48 g 3   fluticasone furoate-vilanterol (BREO ELLIPTA) 200-25 MCG/INH AEPB Inhale 1 puff into the lungs daily. 90 each 3   gabapentin (NEURONTIN) 300 MG capsule TAKE 2 CAPSULES BY MOUTH TWICE A DAY. 360 capsule 1   glipiZIDE (GLUCOTROL) 10 MG tablet Take one tablet daily for diabetes. 90 tablet 3   insulin degludec (TRESIBA) 100 UNIT/ML FlexTouch Pen Inject 35 Units into the skin daily. For diabetes. 30 mL 1   Insulin Pen Needle (UNIFINE PENTIPS) 31G X 6 MM MISC USE NIGHTLY WITH INSULIN 100 each 3   Ipratropium-Albuterol (COMBIVENT RESPIMAT) 20-100 MCG/ACT AERS respimat INHALE ONE PUFF INTO THE LUNGS EVERY FOUR HOURS AS NEEDED FOR WHEEZING 4 g 0   Lancets (ONETOUCH DELICA PLUS [id number]) MISC USE TO CHECK BLOOD SUGAR UP TO FOUR TIMES DAILY 100 each 5   lansoprazole (PREVACID) 30 MG capsule Take 1 capsule (30 mg total) by mouth 2 (two) times daily before a meal. For heartburn. 180 capsule 3   metFORMIN (GLUCOPHAGE-XR) 500 MG 24 hr  tablet Take 2 tablets (1,000 mg total) by mouth 2 (two) times daily with a meal. For diabetes. 360 tablet 3   MOVANTIK 25 MG TABS tablet Take 25 mg by mouth every morning.     NUCYNTA 75 MG tablet Take 75 mg by mouth 3 (three) times daily as needed.     ONETOUCH VERIO test strip USE TO CHECK BLOOD SUGAR UP TO FOUR TIMES DAILY 100 strip 5   tamsulosin (FLOMAX) 0.4 MG CAPS capsule TAKE ONE CAPSULE BY MOUTH DAILY 90 capsule 1   Turmeric 500 MG CAPS Take 1 capsule by mouth daily.     zolpidem (AMBIEN) 10 MG tablet TAKE ONE TABLET (10MG TOTAL) BY MOUTH ATBEDTIME AS NEEDED FOR SLEEP 90 tablet 0   No current facility-administered medications on file prior to visit.    BP 126/62    Pulse 84    Temp 98.6 F (37 C) (Temporal)    Ht _0  (1.778 m)    Wt 257 lb (116.6 kg)    SpO2 95%    BMI 36.88 kg/m  Objective:   Physical Exam Cardiovascular:     Rate and Rhythm: Normal rate and regular rhythm.  Pulmonary:     Effort: Pulmonary effort is normal.     Breath sounds: Normal breath sounds. No wheezing or rales.  Abdominal:     General: Bowel sounds are normal.     Palpations: Abdomen is soft.     Tenderness: There is no abdominal tenderness.  Musculoskeletal:     Cervical back: Neck supple.  Skin:    General: Skin is warm and dry.  Neurological:     Mental Status: He is alert and oriented to person, place, and time.  Psychiatric:        Mood and Affect: Mood normal.          Assessment & Plan:      This visit occurred during the SARS-CoV-2 public health emergency.  Safety protocols were in place, including screening questions prior to the visit, additional usage of staff PPE, and extensive cleaning  of exam room while observing appropriate contact time as indicated for disinfecting solutions.

## 2021-11-20 NOTE — Patient Instructions (Signed)
It was a pleasure to see you today!   

## 2021-11-20 NOTE — Assessment & Plan Note (Signed)
Exam today stable. Labs reviewed from recent visit.  Glucose improving, commended him on obtaining the [employer] 3.  ECG today with NSR with rate of 80, no acute ST changes, PAC/PVC. Appears similar to ECG from 2019.  Cleared for surgery. Form signed and is to be faxed off today

## 2021-12-03 DIAGNOSIS — R053 Chronic cough: Secondary | ICD-10-CM

## 2021-12-05 MED ORDER — AMOXICILLIN-POT CLAVULANATE 875-125 MG PO TABS: 1.0000 | ORAL_TABLET | Freq: Two times a day (BID) | ORAL | 0 refills | Status: DC

## 2021-12-07 ENCOUNTER — Other Ambulatory Visit: Payer: Self-pay | Admitting: Internal Medicine

## 2021-12-07 ENCOUNTER — Telehealth: Payer: Self-pay | Admitting: Internal Medicine

## 2021-12-07 MED ORDER — ALBUTEROL SULFATE (2.5 MG/3ML) 0.083% IN NEBU: INHALATION_SOLUTION | RESPIRATORY_TRACT | 0 refills | Status: DC

## 2021-12-07 NOTE — Telephone Encounter (Signed)
I left a brief message per patient requested that a refill was sent to his pharmacy and he was to call back with any questions. Nothing further needed.

## 2021-12-10 ENCOUNTER — Other Ambulatory Visit: Payer: Self-pay | Admitting: Internal Medicine

## 2021-12-10 DIAGNOSIS — F17210 Nicotine dependence, cigarettes, uncomplicated: Secondary | ICD-10-CM | POA: Diagnosis not present

## 2021-12-10 DIAGNOSIS — J45909 Unspecified asthma, uncomplicated: Secondary | ICD-10-CM | POA: Diagnosis not present

## 2021-12-11 DIAGNOSIS — N529 Male erectile dysfunction, unspecified: Secondary | ICD-10-CM | POA: Diagnosis not present

## 2021-12-11 DIAGNOSIS — E291 Testicular hypofunction: Secondary | ICD-10-CM | POA: Diagnosis not present

## 2021-12-14 DIAGNOSIS — N529 Male erectile dysfunction, unspecified: Secondary | ICD-10-CM | POA: Diagnosis not present

## 2021-12-14 DIAGNOSIS — E291 Testicular hypofunction: Secondary | ICD-10-CM | POA: Diagnosis not present

## 2021-12-18 ENCOUNTER — Other Ambulatory Visit: Payer: Self-pay

## 2021-12-18 ENCOUNTER — Encounter: Payer: Self-pay | Admitting: Internal Medicine

## 2021-12-18 ENCOUNTER — Ambulatory Visit (INDEPENDENT_AMBULATORY_CARE_PROVIDER_SITE_OTHER): Payer: Medicare Other | Admitting: Internal Medicine

## 2021-12-18 VITALS — BP 110/66 | HR 94 | Temp 98.2°F | Ht 70.0 in | Wt 248.0 lb

## 2021-12-18 DIAGNOSIS — F17210 Nicotine dependence, cigarettes, uncomplicated: Secondary | ICD-10-CM | POA: Diagnosis not present

## 2021-12-18 DIAGNOSIS — J45909 Unspecified asthma, uncomplicated: Secondary | ICD-10-CM | POA: Diagnosis not present

## 2021-12-18 MED ORDER — DOXYCYCLINE HYCLATE 100 MG PO TABS: 100.0000 mg | ORAL_TABLET | Freq: Two times a day (BID) | ORAL | 0 refills | Status: DC

## 2021-12-18 MED ORDER — METHYLPREDNISOLONE ACETATE 80 MG/ML IJ SUSP
120.0000 mg | Freq: Once | INTRAMUSCULAR | Status: AC
  Administered 2021-12-18: 120 mg via INTRAMUSCULAR

## 2021-12-18 NOTE — Assessment & Plan Note (Signed)
Active smoker - PFT's s sign copd 06/2012 - med calendar 2/13 /15 > not using 11/29/14  - 11/29/2014 p extensive coaching HFA effectiveness =    90% from a baseline of 50%  -PFTs wnl 03/08/2015 4 h p alb neb> rec MCT at least 6 h p neb > done 10/12/2015  POS > resume dulera 100 2bid - 10/31/2015    increase dulera to 200 bid (samples provided)  - 05/23/2017  BREO 200 rx initiated > much better  - Spirometry 11/10/2018  FEV1 3.0 (76%)  Ratio 84 s curvature after am BREO 200  - 01/04/2020  After extensive coaching inhaler device,  effectiveness =    90% > change back to dulera 200 2bid due to severe cough on breo and overuse of saba  - 05/17/2020 req to go back to breo 200 > improved 08/18/2020    Flare tiwht mostly pseudowheeze on exam rec  Pulse lip breathing reviewed Doxy x 7 d  depomedrol 120 mg IM  Max gerd rx  Breo 200 plus approp saba:  Re SABA :  I spent extra time with pt today reviewing appropriate use of albuterol for prn use on exertion with the following points: 1) saba is for relief of sob that does not improve by walking a slower pace or resting but rather if the pt does not improve after trying this first. 2) If the pt is convinced, as many are, that saba helps recover from activity faster then it's easy to tell if this is the case by re-challenging : ie stop, take the inhaler, then p 5 minutes try the exact same activity (intensity of workload) that just caused the symptoms and see if they are substantially diminished or not after saba 3) if there is an activity that reproducibly causes the symptoms, try the saba 15 min before the activity on alternate days   If in fact the saba really does help, then fine to continue to use it prn but advised may need to look closer at the maintenance regimen being used to achieve better control of airways disease with exertion.

## 2021-12-18 NOTE — Assessment & Plan Note (Signed)
The key is to stop smoking completely before smoking completely stops you!         Each maintenance medication was reviewed in detail including emphasizing most importantly the difference between maintenance and prns and under what circumstances the prns are to be triggered using an action plan format where appropriate.  Total time for H and P, chart review, counseling, reviewing hfa/dpi device(s) and generating customized AVS unique to this office visit / same day charting = 25 min

## 2021-12-18 NOTE — Patient Instructions (Addendum)
Doxycline 100 mg twice daily x 7 days   Depomedrol 120 mg IM   Please schedule a follow up office visit in 6 weeks, call sooner if needed - [facility]

## 2021-12-18 NOTE — Progress Notes (Signed)
Subjective:   Patient ID: Joseph [name], male    DOB: [birth year]   MRN: [id number]    Brief patient profile:  58 yowm active smoker without significant airflow obst by PFTs 06/02/12 and 03/08/2015    History of Present Illness  In April 2013 cc  had pneumonia apparently. Went to [facility] ER and found some abnormality (nodule based on review of ER notes) on CXR that resulted in CT Angio 03/07/12 same visit. PE ruled out but found to have mediastinal nodes but the cxr nodule was not there. Apparently ER doctor said it was lymphoma and asked to see an oncologist. But primary care doctor felt that was over zealous and referred patient here. He prefers conservative mgmt to nodes if clinical suspicion for cancer is low  cc lack of energy (spent most of days yesterday in bed), esp in pollen, chronic dyspnea on exertion for 180 feet relieved by rest  But slowly progressive since 2002 for which he uses albuterol prn on average 4 times a day.  Walkt test 185 feet x 3 laps: no desaturation  He smokes at baseline; trying quit, prior intolerance (dreams) with chantix.   Only baseline mild chronic cough with associtaed wheeze that is occasional.. Does have some B symptoms like nocturnal diaphoresis (soaks pillows). Denies weight loss.   CT ANGIOGRAPHY CHEST  Comparison: 03/07/2012  An abnormal right lower paratracheal node has a short axis diameter  of 1.6 cm. An abnormal AP window lymph node has a short axis  diameter of 1.4 cm. Mild bilateral hilar and infrahilar adenopathy  noted. Scattered small axillary lymph nodes are present.  A subcarinal node has a short axis diameter of 1.5 cm.     OV 08/14/2012 Mediastinal nodes: PET scan 06/10/12 shows very low uptake in the mediastinal nodes. Suspicion for cancer is very low. Lymph nodes include 13 mm right lower paratracheal lymph node and 10 mm prevascular lymph node. ACE level 04/10/12  - is in 20s and normal  Still dyspneic: 1 flught of steps and better  with rest and with albuterol and atrovent prn. Dyspne also worse in winter and cooler weathers. Rates it as moderate. STable since last visit to slightly worse. thre is some associtaed cough. No asociated weight loss. COugh is moderate and worst early in morning and in cooler temperatues.. Denies sinus drainage. PFts show mixed picture - fvc 3.4L/69%, fev1 2.9/76%, Ratio 85 but 12% bd respose. TLC 5.7/83%. DLCO 23.7/69% rec rec #MEdiastinal node  - suspicion for cancer is low  - you will need CT chest in a year; we will schedule it later  #Smoking  - glad you are working on quitting  #Shortness of breath - this is due to asthma/copd and weight  - please start QVAR 2 puff twice daily - take sample and show technique and take prescription too  - focus on weight loss through the low glycemic diet; take diet sheet from [place]  #FOllowup  - 3 months with spirometry at followup  - flu shot and pneumovax through PMD as discussed   12/17/2013  acute ov/Lexander [name] still active smoker re: recurrent pattern of coughing x a decade intermittent / out of control since nov 2014/ can't take symbicort makes it worse / on percocet one tid at baseline  Chief Complaint  Patient presents with   Acute Visit    MR pt.  C/o mostly nonprod cough with some thick white mucous, coughing until he almost passes out.  Went to [employer]  [facility] last week.    duoneb daily  Sometime skips the doses later in the day but always uses the am dose, sputum to purulent. Mostly sob with cough/ otherwise breaths ok Last neb was 4 h prior to OV   rec For Cough - use the flutter valve as much as you can plus mucinex dm 1200 mg every 12 hours as needed supplement with percocet up to 2 every 4hours For Breathing  Only use your duoneb  rescue medication . - Ok to use up to    every 4 hours if you must  Prednisone 10 mg take  4 each am x 2 days,   2 each am x 2 days,  1 each am x 2 days and stop  Pantoprazole (protonix) 40 mg   Take 30-60 min  before first meal of the day and Pepcid 20 mg one bedtime until return to office - this is the best way to tell whether stomach acid is contributing to your problem.  GERD  Diet . Please schedule a follow up office visit in 2 weeks, sooner if needed with all meds in hand    07/27/2014 f/u ov/Joseph [name] re: asthma / still smoking  Chief Complaint  Patient presents with   Follow-up    Pt c/o increased SOB and cough for the past month- cough is occ prod with minimal clear sputum. Pt currently taking clindamycin for staph infection. Rarely using albuterol inhaler, but is using neb approx 4 times per day.   off dulera x 1.5 week denies more sob/ cough or need for rescue on vs off rec Plan A=  dulera 200 Take 2 puffs first thing in am and then another 2 puffs about 12 hours later (or formulary alternative- call us if you find a cheaper one)  Plan B =  Backup = proaire  Plan C = Backup plan B = nebulized albuterol up to 4 hours but call if needing this much at all The key is to stop smoking completely before smoking completely stops you!    11/29/2014 f/u ov/Joseph [name] re: s/p "aecopd" still smoking  Chief Complaint  Patient presents with   Follow-up    Pt states that his SOB and cough have been worse for the past several wks. He went to ED for COPD exacerbation on 11/14/14.  He is using proair 3-4 times per day and neb about 3 times per day on average.   one month prior to flare needing proair 1-2 per day and neb at least once or twice also - using neb first thing instead of dulera  Better p pred from er then worse again w/in a week of taper off assoc with congested cough but mostly white mucus esp in ams rec Think of your respiratory medications as multiple steps you can take to control your symptoms and avoid having to go to the ER Plan A is your maintenance daily no matter what meds: dulera Take 2 puffs first thing in am and then another 2 puffs about 12 hours later.  Plan B only use after you've used  your maintenance (Plan A) medication, and only if you can't catch your breath: Proair 2 every 4 hours as needed  Plan C only use after you've used plan A and B and still can't catch your breath: nebulizer albuterol 2.5 mg up to every 4 hours  Plan D(for Doctor):  If you've used A thru C and not doing a lot better or still needing C more than  a once a day,  D = call the doctor for evaluation asap Plan E (for ER):  If still not able to catch your breath, even after using your nebulizer up to every 4 hours, go to ER  For cough > delsym 2 tsp every 12 hours is the best you can buy Prevacid 30 mg Take 30-60 min before first meal of the day  GERD diet   10/03/2015  f/u ov/Paraskevi Funez re: still smoking /  ? asthma vs all vcd/ gerd  Chief Complaint  Patient presents with   Follow-up    Pt states his breathing is overall doing well. He is using rescue inhaler and neb several times per day.   Wife left him in July 2016 and losing wt, dm better, breathing not since stopped dulera and never went for mct and can't afford it  No noct symptoms after hs saba/ cpap  saba use up when no access to dulera 200 but even on it he continues to perceive need for saba daily despite nl exams/ pfts rec Please see patient coordinator before you leave today  to schedule a methacholine challenge test>  POSITIVE Ok to resume dulera but stop it 24 hours before the methacholine and no albuterol w/in 6 hours  Please schedule a follow up office visit in 4 weeks, sooner if needed      COVID Nov 02 2021   12/18/2021  f/u ov/Jestina Stephani re: AB maint on Breo 100 and way too much neb despite breo 200 daily  Chief Complaint  Patient presents with   Follow-up    Dx with Covid 19 Nov 01 2021. He states he has been fighting a cold and wakes up in the am with a lot of chest congestion. He has a prod cough with green/yellow sputum.    Dyspnea:  limited by R knee, surgery for R knee repair on 12/20/21 planned Cough: x sev weeks Sleeping: on side /  slt elevation  SABA use: way too much daytime use  02: none Covid status:   never vax    No obvious day to day or daytime variability or assoc  mucus plugs or hemoptysis or cp or chest tightness, subjective wheeze or overt sinus or hb symptoms.   sleepoing without nocturnal  or early am exacerbation  of respiratory  c/o's or need for noct saba. Also denies any obvious fluctuation of symptoms with weather or environmental changes or other aggravating or alleviating factors except as outlined above   No unusual exposure hx or h/o childhood pna/ asthma or knowledge of premature birth.  Current Allergies, Complete Past Medical History, Past Surgical History, Family History, and Social History were reviewed in [employer] record.  ROS  The following are not active complaints unless bolded Hoarseness, sore throat, dysphagia, dental problems, itching, sneezing,  nasal congestion or discharge of excess mucus or purulent secretions, ear ache,   fever, chills, sweats, unintended wt loss or wt gain, classically pleuritic or exertional cp,  orthopnea pnd or arm/hand swelling  or leg swelling, presyncope, palpitations, abdominal pain, anorexia, nausea, vomiting, diarrhea  or change in bowel habits or change in bladder habits, change in stools or change in urine, dysuria, hematuria,  rash, arthralgias, visual complaints, headache, numbness, weakness or ataxia or problems with walking or coordination,  change in mood or  memory.        Current Meds  Medication Sig   albuterol (PROVENTIL) (2.5 MG/3ML) 0.083% nebulizer solution USE 3 ML(1 VIAL) VIA  NEBULIZER FOUR TIMES DAILY AS NEEDED FOR SHORTNESS OF BREATH   ANDROGEL PUMP 20.25 MG/ACT (1.62%) GEL Apply 2 Pump topically daily.    atorvastatin (LIPITOR) 40 MG tablet TAKE ONE TABLET ($RemoveBef'40MG'kjiUSjfOlL$  TOTAL) BY MOUTH DAILY FOR CHOLESTEROL   benzonatate (TESSALON) 200 MG capsule Take 1 capsule (200 mg total) by mouth 3 (three) times daily as needed for  cough.   blood glucose meter kit and supplies KIT Dispense based on patient and insurance preference. Use up to four times daily as directed. (FOR ICD-9 250.00, 250.01).   buPROPion (WELLBUTRIN SR) 150 MG 12 hr tablet Take 1 tablet (150 mg total) by mouth 2 (two) times daily. For anxiety and depression.   cetirizine (ZYRTEC) 10 MG tablet Take 1 tablet (10 mg total) by mouth at bedtime for allergies.   citalopram (CELEXA) 40 MG tablet Take 1 tablet (40 mg total) by mouth daily. For anxiety and depression.   Continuous Blood Gluc Sensor (FREESTYLE LIBRE 3 SENSOR) MISC 1 Units by Does not apply route 4 (four) times daily. Place 1 sensor on the skin every 14 days. Use to check glucose continuously   cyclobenzaprine (FLEXERIL) 10 MG tablet Take 10 mg by mouth 2 (two) times daily as needed.   empagliflozin (JARDIANCE) 10 MG TABS tablet Take 1 tablet (10 mg total) by mouth daily. For diabetes.   fluticasone (FLONASE) 50 MCG/ACT nasal spray Place 2 sprays into both nostrils daily as needed for allergies or rhinitis.   fluticasone furoate-vilanterol (BREO ELLIPTA) 200-25 MCG/INH AEPB Inhale 1 puff into the lungs daily.   gabapentin (NEURONTIN) 300 MG capsule TAKE 2 CAPSULES BY MOUTH TWICE A DAY.   glipiZIDE (GLUCOTROL) 10 MG tablet Take one tablet daily for diabetes.   insulin degludec (TRESIBA) 100 UNIT/ML FlexTouch Pen Inject 35 Units into the skin daily. For diabetes.   Insulin Pen Needle (UNIFINE PENTIPS) 31G X 6 MM MISC USE NIGHTLY WITH INSULIN   Ipratropium-Albuterol (COMBIVENT RESPIMAT) 20-100 MCG/ACT AERS respimat INHALE ONE PUFF INTO THE LUNGS EVERY FOUR HOURS AS NEEDED FOR WHEEZING   Lancets (ONETOUCH DELICA PLUS [id number]) MISC USE TO CHECK BLOOD SUGAR UP TO FOUR TIMES DAILY   lansoprazole (PREVACID) 30 MG capsule Take 1 capsule (30 mg total) by mouth 2 (two) times daily before a meal. For heartburn.   metFORMIN (GLUCOPHAGE-XR) 500 MG 24 hr tablet Take 2 tablets (1,000 mg total) by mouth 2 (two)  times daily with a meal. For diabetes.   MOVANTIK 25 MG TABS tablet Take 25 mg by mouth every morning.   NUCYNTA 75 MG tablet Take 75 mg by mouth 3 (three) times daily as needed.   ONETOUCH VERIO test strip USE TO CHECK BLOOD SUGAR UP TO FOUR TIMES DAILY   tamsulosin (FLOMAX) 0.4 MG CAPS capsule TAKE ONE CAPSULE BY MOUTH DAILY   Turmeric 500 MG CAPS Take 1 capsule by mouth daily.   zolpidem (AMBIEN) 10 MG tablet TAKE ONE TABLET ($RemoveBef'10MG'ZZFGNrltpt$  TOTAL) BY MOUTH ATBEDTIME AS NEEDED FOR SLEEP                          Objective:   Physical Exam  Wts                  12/18/2021      248  08/18/2020      252  01/04/2020        251  12/31/2013  294  >    07/27/2014  286 >  11/29/2014   289> 03/08/2015  284 > 10/03/2015 266 > 10/31/2015 273 > 12/12/2015    280 > 05/23/2017  245 > 11/10/2018  245    Vital signs reviewed  12/18/2021  - Note at rest 02 sats  98% on RA   General appearance:    amb mod obese wm nad - prominent pseudowheeze resolves with plm    HEENT : pt wearing mask not removed for exam due to covid -19 concerns.    NECK :  without JVD/Nodes/TM/ nl carotid upstrokes bilaterally   LUNGS: no acc muscle use,  Nl contour chest  with minimal true exp  bilaterally without cough on insp or exp maneuvers   CV:  RRR  no s3 or murmur or increase in P2, and no edema   ABD:  soft and nontender with nl inspiratory excursion in the supine position. No bruits or organomegaly appreciated, bowel sounds nl  MS:  Nl gait/ ext warm without deformities, calf tenderness, cyanosis or clubbing No obvious joint restrictions   SKIN: warm and dry without lesions    NEURO:  alert, approp, nl sensorium with  no motor or cerebellar deficits apparent.        I personally reviewed images and agree with radiology impression as follows:  CXR:   portable 03/05/21 No active disease.           Assessment & Plan:

## 2021-12-20 DIAGNOSIS — X58XXXA Exposure to other specified factors, initial encounter: Secondary | ICD-10-CM | POA: Diagnosis not present

## 2021-12-20 DIAGNOSIS — M2341 Loose body in knee, right knee: Secondary | ICD-10-CM | POA: Diagnosis not present

## 2021-12-20 DIAGNOSIS — S83231A Complex tear of medial meniscus, current injury, right knee, initial encounter: Secondary | ICD-10-CM | POA: Diagnosis not present

## 2021-12-20 DIAGNOSIS — S83241A Other tear of medial meniscus, current injury, right knee, initial encounter: Secondary | ICD-10-CM | POA: Diagnosis not present

## 2021-12-20 DIAGNOSIS — S83271A Complex tear of lateral meniscus, current injury, right knee, initial encounter: Secondary | ICD-10-CM | POA: Diagnosis not present

## 2021-12-20 DIAGNOSIS — Y999 Unspecified external cause status: Secondary | ICD-10-CM | POA: Diagnosis not present

## 2021-12-20 DIAGNOSIS — M2241 Chondromalacia patellae, right knee: Secondary | ICD-10-CM | POA: Diagnosis not present

## 2021-12-20 DIAGNOSIS — M94261 Chondromalacia, right knee: Secondary | ICD-10-CM | POA: Diagnosis not present

## 2021-12-20 DIAGNOSIS — S83281A Other tear of lateral meniscus, current injury, right knee, initial encounter: Secondary | ICD-10-CM | POA: Diagnosis not present

## 2021-12-20 DIAGNOSIS — G8918 Other acute postprocedural pain: Secondary | ICD-10-CM | POA: Diagnosis not present

## 2021-12-20 DIAGNOSIS — M958 Other specified acquired deformities of musculoskeletal system: Secondary | ICD-10-CM | POA: Diagnosis not present

## 2021-12-24 ENCOUNTER — Other Ambulatory Visit: Payer: Self-pay | Admitting: Primary Care

## 2021-12-24 DIAGNOSIS — J302 Other seasonal allergic rhinitis: Secondary | ICD-10-CM

## 2021-12-24 DIAGNOSIS — G894 Chronic pain syndrome: Secondary | ICD-10-CM

## 2021-12-24 DIAGNOSIS — G8929 Other chronic pain: Secondary | ICD-10-CM

## 2022-01-01 ENCOUNTER — Other Ambulatory Visit: Payer: Self-pay | Admitting: Primary Care

## 2022-01-01 DIAGNOSIS — E119 Type 2 diabetes mellitus without complications: Secondary | ICD-10-CM

## 2022-01-01 DIAGNOSIS — E1165 Type 2 diabetes mellitus with hyperglycemia: Secondary | ICD-10-CM

## 2022-01-01 DIAGNOSIS — Z794 Long term (current) use of insulin: Secondary | ICD-10-CM

## 2022-01-02 ENCOUNTER — Encounter (HOSPITAL_BASED_OUTPATIENT_CLINIC_OR_DEPARTMENT_OTHER)
Admission: RE | Disposition: A | Discharge status: Home / Self Care | Payer: Self-pay | Source: Ambulatory Visit | Attending: Orthopedic Surgery

## 2022-01-02 ENCOUNTER — Ambulatory Visit (HOSPITAL_BASED_OUTPATIENT_CLINIC_OR_DEPARTMENT_OTHER): Payer: Medicare Other | Admitting: Certified Registered Nurse Anesthetist

## 2022-01-02 ENCOUNTER — Encounter (HOSPITAL_BASED_OUTPATIENT_CLINIC_OR_DEPARTMENT_OTHER): Payer: Self-pay | Admitting: Orthopedic Surgery

## 2022-01-02 ENCOUNTER — Ambulatory Visit (HOSPITAL_BASED_OUTPATIENT_CLINIC_OR_DEPARTMENT_OTHER)
Admission: RE | Admit: 2022-01-02 | Discharge: 2022-01-02 | Disposition: A | Discharge status: Home / Self Care | Payer: Medicare Other | Source: Ambulatory Visit | Attending: Orthopedic Surgery | Admitting: Orthopedic Surgery

## 2022-01-02 ENCOUNTER — Other Ambulatory Visit: Payer: Self-pay

## 2022-01-02 DIAGNOSIS — M2241 Chondromalacia patellae, right knee: Secondary | ICD-10-CM | POA: Diagnosis not present

## 2022-01-02 DIAGNOSIS — Y838 Other surgical procedures as the cause of abnormal reaction of the patient, or of later complication, without mention of misadventure at the time of the procedure: Secondary | ICD-10-CM | POA: Diagnosis not present

## 2022-01-02 DIAGNOSIS — J449 Chronic obstructive pulmonary disease, unspecified: Secondary | ICD-10-CM | POA: Diagnosis not present

## 2022-01-02 DIAGNOSIS — T8140XA Infection following a procedure, unspecified, initial encounter: Secondary | ICD-10-CM | POA: Insufficient documentation

## 2022-01-02 DIAGNOSIS — E119 Type 2 diabetes mellitus without complications: Secondary | ICD-10-CM

## 2022-01-02 DIAGNOSIS — T8453XA Infection and inflammatory reaction due to internal right knee prosthesis, initial encounter: Secondary | ICD-10-CM | POA: Diagnosis not present

## 2022-01-02 DIAGNOSIS — F17210 Nicotine dependence, cigarettes, uncomplicated: Secondary | ICD-10-CM | POA: Diagnosis not present

## 2022-01-02 DIAGNOSIS — G8918 Other acute postprocedural pain: Secondary | ICD-10-CM | POA: Diagnosis not present

## 2022-01-02 DIAGNOSIS — M25461 Effusion, right knee: Secondary | ICD-10-CM | POA: Diagnosis not present

## 2022-01-02 DIAGNOSIS — M25561 Pain in right knee: Secondary | ICD-10-CM | POA: Diagnosis not present

## 2022-01-02 HISTORY — PX: INCISION AND DRAINAGE ABSCESS: SHX5864

## 2022-01-02 LAB — POCT I-STAT, CHEM 8
BUN: 9 mg/dL (ref 6–20)
Calcium, Ion: 1.06 mmol/L — ABNORMAL LOW (ref 1.15–1.40)
Chloride: 95 mmol/L — ABNORMAL LOW (ref 98–111)
Creatinine, Ser: 0.7 mg/dL (ref 0.61–1.24)
Glucose, Bld: 171 mg/dL — ABNORMAL HIGH (ref 70–99)
HCT: 46 % (ref 39.0–52.0)
Hemoglobin: 15.6 g/dL (ref 13.0–17.0)
Potassium: 3.5 mmol/L (ref 3.5–5.1)
Sodium: 133 mmol/L — ABNORMAL LOW (ref 135–145)
TCO2: 25 mmol/L (ref 22–32)

## 2022-01-02 LAB — GLUCOSE, CAPILLARY: Glucose-Capillary: 250 mg/dL — ABNORMAL HIGH (ref 70–99)

## 2022-01-02 SURGERY — INCISION AND DRAINAGE, ABSCESS
Anesthesia: General | Site: Knee | Laterality: Right

## 2022-01-02 MED ORDER — FENTANYL CITRATE (PF) 100 MCG/2ML IJ SOLN
100.0000 ug | Freq: Once | INTRAMUSCULAR | Status: AC
  Administered 2022-01-02: 100 ug via INTRAVENOUS

## 2022-01-02 MED ORDER — 0.9 % SODIUM CHLORIDE (POUR BTL) OPTIME
TOPICAL | Status: DC | PRN
Start: 2022-01-02 — End: 2022-01-02
  Administered 2022-01-02: 500 mL

## 2022-01-02 MED ORDER — ASPIRIN EC 81 MG PO TBEC: 81.0000 mg | DELAYED_RELEASE_TABLET | Freq: Two times a day (BID) | ORAL | 0 refills | Status: DC

## 2022-01-02 MED ORDER — MIDAZOLAM HCL 2 MG/2ML IJ SOLN
INTRAMUSCULAR | Status: AC
  Filled 2022-01-02: qty 2

## 2022-01-02 MED ORDER — ACETAMINOPHEN 500 MG PO TABS
ORAL_TABLET | ORAL | Status: AC
  Filled 2022-01-02: qty 2

## 2022-01-02 MED ORDER — FENTANYL CITRATE (PF) 100 MCG/2ML IJ SOLN
INTRAMUSCULAR | Status: AC
  Filled 2022-01-02: qty 2

## 2022-01-02 MED ORDER — SODIUM CHLORIDE 0.9 % IR SOLN
Status: DC | PRN
  Administered 2022-01-02 (×2): 3000 mL

## 2022-01-02 MED ORDER — PROMETHAZINE HCL 25 MG/ML IJ SOLN: 6.2500 mg | INTRAMUSCULAR | Status: DC | PRN

## 2022-01-02 MED ORDER — ROPIVACAINE HCL 7.5 MG/ML IJ SOLN
INTRAMUSCULAR | Status: DC | PRN
  Administered 2022-01-02: 20 mL via PERINEURAL

## 2022-01-02 MED ORDER — VANCOMYCIN HCL IN DEXTROSE 1-5 GM/200ML-% IV SOLN
INTRAVENOUS | Status: AC
  Filled 2022-01-02: qty 200

## 2022-01-02 MED ORDER — ACETAMINOPHEN 500 MG PO TABS
1000.0000 mg | ORAL_TABLET | Freq: Once | ORAL | Status: AC
  Administered 2022-01-02: 1000 mg via ORAL

## 2022-01-02 MED ORDER — CELECOXIB 200 MG PO CAPS
ORAL_CAPSULE | ORAL | Status: AC
  Filled 2022-01-02: qty 1

## 2022-01-02 MED ORDER — DEXAMETHASONE SODIUM PHOSPHATE 10 MG/ML IJ SOLN
INTRAMUSCULAR | Status: DC | PRN
  Administered 2022-01-02: 5 mg via INTRAVENOUS

## 2022-01-02 MED ORDER — HYDROCODONE-ACETAMINOPHEN 5-325 MG PO TABS: 1.0000 | ORAL_TABLET | ORAL | 0 refills | Status: AC | PRN

## 2022-01-02 MED ORDER — FENTANYL CITRATE (PF) 100 MCG/2ML IJ SOLN: 25.0000 ug | INTRAMUSCULAR | Status: DC | PRN

## 2022-01-02 MED ORDER — CEFAZOLIN SODIUM-DEXTROSE 2-4 GM/100ML-% IV SOLN
2.0000 g | INTRAVENOUS | Status: AC
  Administered 2022-01-02: 2 g via INTRAVENOUS

## 2022-01-02 MED ORDER — AMISULPRIDE (ANTIEMETIC) 5 MG/2ML IV SOLN: 10.0000 mg | Freq: Once | INTRAVENOUS | Status: DC | PRN

## 2022-01-02 MED ORDER — ONDANSETRON HCL 4 MG/2ML IJ SOLN
INTRAMUSCULAR | Status: DC | PRN
  Administered 2022-01-02: 4 mg via INTRAVENOUS

## 2022-01-02 MED ORDER — DEXAMETHASONE SODIUM PHOSPHATE 10 MG/ML IJ SOLN
INTRAMUSCULAR | Status: AC
  Filled 2022-01-02: qty 1

## 2022-01-02 MED ORDER — FENTANYL CITRATE (PF) 100 MCG/2ML IJ SOLN
INTRAMUSCULAR | Status: DC | PRN
Start: 2022-01-02 — End: 2022-01-02
  Administered 2022-01-02 (×4): 50 ug via INTRAVENOUS

## 2022-01-02 MED ORDER — CEFAZOLIN SODIUM-DEXTROSE 2-4 GM/100ML-% IV SOLN
INTRAVENOUS | Status: AC
  Filled 2022-01-02: qty 100

## 2022-01-02 MED ORDER — PROPOFOL 10 MG/ML IV BOLUS
INTRAVENOUS | Status: DC | PRN
  Administered 2022-01-02: 180 mg via INTRAVENOUS

## 2022-01-02 MED ORDER — MIDAZOLAM HCL 5 MG/5ML IJ SOLN
INTRAMUSCULAR | Status: DC | PRN
  Administered 2022-01-02: 2 mg via INTRAVENOUS

## 2022-01-02 MED ORDER — MIDAZOLAM HCL 2 MG/2ML IJ SOLN
2.0000 mg | Freq: Once | INTRAMUSCULAR | Status: AC
  Administered 2022-01-02: 2 mg via INTRAVENOUS

## 2022-01-02 MED ORDER — VANCOMYCIN HCL IN DEXTROSE 1-5 GM/200ML-% IV SOLN
1000.0000 mg | Freq: Once | INTRAVENOUS | Status: AC
  Administered 2022-01-02: 1000 mg via INTRAVENOUS

## 2022-01-02 MED ORDER — PROPOFOL 10 MG/ML IV BOLUS
INTRAVENOUS | Status: AC
  Filled 2022-01-02: qty 20

## 2022-01-02 MED ORDER — CELECOXIB 200 MG PO CAPS
200.0000 mg | ORAL_CAPSULE | Freq: Once | ORAL | Status: AC
  Administered 2022-01-02: 200 mg via ORAL

## 2022-01-02 MED ORDER — ONDANSETRON HCL 4 MG/2ML IJ SOLN
INTRAMUSCULAR | Status: AC
  Filled 2022-01-02: qty 2

## 2022-01-02 MED ORDER — LACTATED RINGERS IV SOLN: INTRAVENOUS | Status: DC

## 2022-01-02 SURGICAL SUPPLY — 28 items
APL PRP STRL LF DISP 70% ISPRP (MISCELLANEOUS) ×1
BNDG ELASTIC 4X5.8 VLCR STR LF (GAUZE/BANDAGES/DRESSINGS) ×1 IMPLANT
BNDG ELASTIC 6X5.8 VLCR STR LF (GAUZE/BANDAGES/DRESSINGS) ×3 IMPLANT
CHLORAPREP W/TINT 26 (MISCELLANEOUS) ×3 IMPLANT
DISSECTOR  3.8MM X 13CM (MISCELLANEOUS) ×2
DISSECTOR 3.8MM X 13CM (MISCELLANEOUS) ×2 IMPLANT
DRAPE ARTHROSCOPY W/POUCH 114 (DRAPES) ×3 IMPLANT
DRAPE IMP U-DRAPE 54X76 (DRAPES) ×3 IMPLANT
DRAPE U-SHAPE 47X51 STRL (DRAPES) ×3 IMPLANT
DRSG EMULSION OIL 3X3 NADH (GAUZE/BANDAGES/DRESSINGS) ×3 IMPLANT
DRSG PAD ABDOMINAL 8X10 ST (GAUZE/BANDAGES/DRESSINGS) ×1 IMPLANT
EVACUATOR 1/8 PVC DRAIN (DRAIN) ×1 IMPLANT
GAUZE 4X4 16PLY ~~LOC~~+RFID DBL (SPONGE) ×3 IMPLANT
GAUZE SPONGE 4X4 12PLY STRL (GAUZE/BANDAGES/DRESSINGS) ×3 IMPLANT
GLOVE SRG 8 PF TXTR STRL LF DI (GLOVE) ×2 IMPLANT
GLOVE SURG ENC MOIS LTX SZ7.5 (GLOVE) ×3 IMPLANT
GLOVE SURG UNDER POLY LF SZ8 (GLOVE) ×2
GOWN STRL REUS W/TWL LRG LVL3 (GOWN DISPOSABLE) ×6 IMPLANT
IV NS IRRIG 3000ML ARTHROMATIC (IV SOLUTION) ×6 IMPLANT
KIT TURNOVER CYSTO (KITS) ×3 IMPLANT
MANIFOLD NEPTUNE II (INSTRUMENTS) ×3 IMPLANT
NS IRRIG 500ML POUR BTL (IV SOLUTION) ×1 IMPLANT
PACK ARTHROSCOPY DSU (CUSTOM PROCEDURE TRAY) ×3 IMPLANT
PACK BASIN DAY SURGERY FS (CUSTOM PROCEDURE TRAY) ×3 IMPLANT
SUT ETHILON 3 0 PS 1 (SUTURE) ×4 IMPLANT
TAPE CLOTH 3X10 TAN LF (GAUZE/BANDAGES/DRESSINGS) IMPLANT
TOWEL OR 17X26 10 PK STRL BLUE (TOWEL DISPOSABLE) ×3 IMPLANT
TUBING ARTHROSCOPY IRRIG 16FT (MISCELLANEOUS) ×3 IMPLANT

## 2022-01-02 NOTE — H&P (Signed)
ORTHOPAEDIC H&P  REQUESTING PHYSICIAN: Willaim Sheng, MD  Chief Complaint: Right knee postoperative infection  HPI: Joseph [name] is a 51 y.o. male who presented to clinic today after undergoing right medial and lateral meniscectomy and chondroplasty with Dr. Percell Miller on 12/20/21.  He had been doing very well and felt that his pain was getting better than before surgery, but started to develop increased pain and swelling in the right knee this past Sunday.  He also noted having a fever up to 101 as well as nausea and vomiting.  At this point he feels like the pain is very severe has significant swelling, he has difficulty bending and extending the knee.  He denies pain in other joints or extremities.  Past Medical History:  Diagnosis Date   Acute encephalopathy 03/24/2018   Anxiety    Arthritis    oa left knee and lower back   Asthma    Cataract    hx of bilateral   Chronic back pain    Chronic pain of left knee    COPD (chronic obstructive pulmonary disease) ([place])    Diabetes mellitus    type 2   Encephalopathy acute 03/24/2018   GERD (gastroesophageal reflux disease)    INGUINAL PAIN, LEFT 10/03/2009   Qualifier: Diagnosis of  By: Oneida Alar MD, Sandi L    Oral thrush 11/10/2018   Overdose    03-24-18 accidental   Rash and nonspecific skin eruption 03/12/2017   Shortness of breath    with heavy exertion   Sleep apnea    Past Surgical History:  Procedure Laterality Date   CARPAL TUNNEL RELEASE Left    CATARACT EXTRACTION W/PHACO Right 03/11/2016   Procedure: CATARACT EXTRACTION PHACO AND INTRAOCULAR LENS PLACEMENT ([place]);  Surgeon: Tonny Branch, MD;  Location: [facility];  Service: Ophthalmology;  Laterality: Right;  CDE 4.24   EYE SURGERY Right 2018   ioc with lens replacement   HERNIA REPAIR Bilateral [id number]   umbilical   KNEE ARTHROSCOPY WITH MEDIAL MENISECTOMY Left 04/22/2014   Procedure: LEFT KNEE ARTHROSCOPY WITH MEDIAL MENISECTOMY;  Surgeon: Carole Civil, MD;  Location: [facility];  Service: Orthopedics;  Laterality: Left;   KNEE ARTHROSCOPY WITH MEDIAL MENISECTOMY Right 12/06/2015   Procedure: RIGHT KNEE ARTHROSCOPY WITH MEDIAL MENISECTOMY;  Surgeon: Carole Civil, MD;  Location: [facility];  Service: Orthopedics;  Laterality: Right;   KNEE ARTHROSCOPY WITH MEDIAL MENISECTOMY Left 03/20/2017   Procedure: KNEE ARTHROSCOPY WITH MEDIAL MENISECTOMY;  Surgeon: Carole Civil, MD;  Location: [facility];  Service: Orthopedics;  Laterality: Left;   ORIF WRIST FRACTURE Left 12/19/2016   Procedure: OPEN REDUCTION INTERNAL FIXATION (ORIF) WRIST FRACTURE;  Surgeon: Leanora Cover, MD;  Location: [place];  Service: Orthopedics;  Laterality: Left;  accumed    PARTIAL KNEE ARTHROPLASTY Left 07/21/2018   Procedure: LEFT UNICOMPARTMENTAL KNEE;  Surgeon: Renette Butters, MD;  Location: [facility];  Service: Orthopedics;  Laterality: Left;   SHOULDER ARTHROCENTESIS Right 2008 or 2010   VASECTOMY  2014   Social History   Socioeconomic History   Marital status: Divorced    Spouse name: Not on file   Number of children: Not on file   Years of education: Not on file   Highest education level: Not on file  Occupational History   Occupation: disability  Tobacco Use   Smoking status: Every Day    Packs/day: 1.25    Years: 26.00    Pack years: 32.50  Types: Cigarettes    Start date: 12/17/1993   Smokeless tobacco: Never   Tobacco comments:    1 ppd 01/04/2020  Vaping Use   Vaping Use: Former  Substance and Sexual Activity   Alcohol use: Never    Alcohol/week: 0.0 standard drinks   Drug use: Never   Sexual activity: Yes    Birth control/protection: Surgical  Other Topics Concern   Not on file  Social History Narrative   Recent visit to [facility] in [place] d/t increased pain where he was prescribed percocet 7.5-325 mg for pain.   Social Determinants of Health   Financial Resource Strain: Not on file  Food Insecurity: Not on file  Transportation Needs: Not on  file  Physical Activity: Not on file  Stress: Not on file  Social Connections: Not on file   Family History  Problem Relation Age of Onset   Emphysema Maternal Grandfather    Emphysema Maternal Grandmother    Clotting disorder Maternal Grandmother    Allergies  Allergen Reactions   Azithromycin Hives   Clarithromycin    Erythromycin Hives   Keflex [Cephalexin] Nausea Only   Metformin Nausea And Vomiting   Symbicort [Budesonide-Formoterol Fumarate] Other (See Comments)    States that 3 doses were used and breathing became worse     Positive ROS: All other systems have been reviewed and were otherwise negative with the exception of those mentioned in the HPI and as above.  Physical Exam: General: Alert, no acute distress Cardiovascular: No pedal edema Respiratory: No cyanosis, no use of accessory musculature Skin: No lesions in the area of chief complaint Neurologic: Sensation intact distally Psychiatric: Patient is competent for consent with normal mood and affect  MUSCULOSKELETAL:  Right knee incision portals are healing appropriately without notable erythema or drainage. Large palpable effusion of the right knee.  Knee is diffusely tender. Tolerates range of motion only approximately 20 to 80 degrees with significant pain through range of motion..  Assessment: Active Problems:   * No active hospital problems. *  Acute right knee postop infection after arthroscopic meniscectomy and chondroplasty by Dr. Percell Miller 1/19.  Plan: Approximately 75 cc of yellow thick purulent fluid were aspirated from the patient's knee today in clinic by Dr. Percell Miller.  Given high clinical suspicion for infection urgent irrigation and debridement was recommended.  Patient has not had anything to eat this morning.  We will plan to take the patient to the OR this afternoon for irrigation and debridement arthroscopically of the right knee.  Additionally the patient will be started on antibiotics and  referred to infectious disease.  Risks including pain, stiffness, recurrence of infection, need for additional surgery were discussed.    Willaim Sheng, MD  Contact information:   [phone] 7am-5pm epic message Dr. Zachery Dakins, or call office for patient follow up: [phone] After hours and holidays please check [email or web address] for group call information for [facility]

## 2022-01-02 NOTE — Transfer of Care (Signed)
Immediate Anesthesia Transfer of Care Note  Patient: DEKEL WAITERS  Procedure(s) Performed: RIGHT KNEE ARTHROSCOPIC IRRIGATION AND DEBRIDEMENT (Right: Knee)  Patient Location: PACU  Anesthesia Type:General  Level of Consciousness: awake, alert , oriented and patient cooperative  Airway & Oxygen Therapy: Patient Spontanous Breathing and Patient connected to face mask oxygen  Post-op Assessment: Report given to RN, Post -op Vital signs reviewed and stable and Patient moving all extremities X 4  Post vital signs: Reviewed and stable  Last Vitals:  Vitals Value Taken Time  BP 122/65 01/02/22 1435  Temp    Pulse 105 01/02/22 1436  Resp 21 01/02/22 1436  SpO2 86 % 01/02/22 1436  Vitals shown include unvalidated device data.  Last Pain:  Vitals:   01/02/22 1145  TempSrc: Oral  PainSc: 10-Worst pain ever      Patients Stated Pain Goal: 3 ([id number])  Complications: No notable events documented.

## 2022-01-02 NOTE — Anesthesia Procedure Notes (Addendum)
Anesthesia Regional Block: Adductor canal block   Pre-Anesthetic Checklist: , timeout performed,  Correct Patient, Correct Site, Correct Laterality,  Correct Procedure, Correct Position, site marked,  Risks and benefits discussed,  Surgical consent,  Pre-op evaluation,  At surgeon's request and post-op pain management  Laterality: Right  Prep: chloraprep       Needles:  Injection technique: Single-shot  Needle Type: Stimulator Needle - 80     Needle Length: 10cm  Needle Gauge: 21     Additional Needles:   Narrative:  Start time: 01/02/2022 12:33 PM End time: 01/02/2022 12:43 PM Injection made incrementally with aspirations every 5 mL.  Performed by: Personally  Anesthesiologist: Heather Roberts, MD

## 2022-01-02 NOTE — Op Note (Addendum)
01/02/2022  2:36 PM  PATIENT:  Joseph [name]    PRE-OPERATIVE DIAGNOSIS:  RIGHT KNEE POST OP INFECTION  POST-OPERATIVE DIAGNOSIS:  Same  PROCEDURE:  RIGHT KNEE ARTHROSCOPIC IRRIGATION AND DEBRIDEMENT  SURGEON:  Kacee Sukhu A Elzie Knisley, MD  PHYSICIAN ASSISTANT: none  ANESTHESIA:   General  PREOPERATIVE INDICATIONS:  Joseph [name] is a  51 y.o. male who had undergone arthroscopic meniscectomy and chondroplasty of Dr. Eulah Pont on 1/19.  He presented to clinic today with concerns for postoperative infection.  Abundant purulent fluid was aspirated from the patient's knee.  Given high suspicion for infection irrigation and debridement was recommended.  The risks benefits and alternatives were discussed with the patient preoperatively including but not limited to the risks of infection, bleeding, nerve injury, cardiopulmonary complications, the need for revision surgery, among others, and the patient was willing to proceed.  ESTIMATED BLOOD LOSS: 5 cc  OPERATIVE IMPLANTS: None  OPERATIVE FINDINGS: Homero Fellers purulence inside the right knee joint sent for culture.  Medial portal with thickened superficial purulent tissue.  OPERATIVE PROCEDURE:  The patient was brought to to the operating room and general anesthesia was induced.  1 g IV vancomycin and 2 g Ancef were administered.  The right leg was prepped and draped in a sterile fashion.  Timeout was called.  Utilizing the patient's old arthroscopic incisions we sharply incised the anterior lateral portal and introduced our trocar and scope.  Moderate purulent tissue was expressed and sent for culture.  Next we incised the medial portal and noted some purulent tissue immediately upon skin incision.  The medial portal was debrided with a curette and thoroughly irrigated.  A shaver was introduced through the medial portal and infrapatellar fat pad was resected.  The suprapatellar pouch, notch, medial, and lateral compartments were evaluated and no debris or  nidus for infection was appreciated.  Next a superolateral outflow portal was created, and 6 L of normal saline were irrigated through the knee.  Adequate hemostasis was achieved.  No further purulence or signs of infection were noted.  A medium Hemovac drain was placed exiting out the superolateral portal.  Wounds were closed with interrupted 3-0 nylon.  A drain stitch was placed to hold the Hemovac drain.  Dry dressing and Ace wrap were applied.  The patient was awoken from anesthesia and taken to PACU in stable condition.  Post op recs: WB: WBAT RLE Abx: Broad-spectrum oral antibiotics prescribed from clinic. Dressing: keep intact until follow up, drain care discussed with the patient's spouse. DVT prophylaxis: aspirin 81mg  BID x2 weeks Follow up: Patient to follow-up this Friday with Dr. Monday for wound check and drain removal. Address: [address], [place], [place]  Office Phone: [phone]

## 2022-01-02 NOTE — Telephone Encounter (Signed)
Received refill requests for metformin and Tresiba insulin. He shouldn't he out of either medication, does he have some of each at home?

## 2022-01-02 NOTE — Anesthesia Postprocedure Evaluation (Signed)
Anesthesia Post Note  Patient: Joseph [name]  Procedure(s) Performed: RIGHT KNEE ARTHROSCOPIC IRRIGATION AND DEBRIDEMENT (Right: Knee)     Patient location during evaluation: PACU Anesthesia Type: General Level of consciousness: sedated Pain management: pain level controlled Vital Signs Assessment: post-procedure vital signs reviewed and stable Respiratory status: spontaneous breathing and respiratory function stable Cardiovascular status: stable Postop Assessment: no apparent nausea or vomiting Anesthetic complications: no   No notable events documented.  Last Vitals:  Vitals:   01/02/22 1600 01/02/22 1630  BP: 122/76 132/87  Pulse: 92 95  Resp: 19 20  Temp: 37 C   SpO2: 91% 95%    Last Pain:  Vitals:   01/02/22 1630  TempSrc:   PainSc: 0-No pain                 Gareld Obrecht DANIEL

## 2022-01-02 NOTE — Anesthesia Preprocedure Evaluation (Addendum)
Anesthesia Evaluation  Patient identified by MRN, date of birth, ID band Patient awake    Reviewed: Allergy & Precautions, NPO status , Patient's Chart, lab work & pertinent test results  History of Anesthesia Complications Negative for: history of anesthetic complications  Airway Mallampati: II  TM Distance: >3 FB Neck ROM: Full    Dental no notable dental hx. (+) Dental Advisory Given   Pulmonary asthma , sleep apnea , COPD,  COPD inhaler, Current Smoker and Patient abstained from smoking.,    Pulmonary exam normal        Cardiovascular negative cardio ROS   Rhythm:Regular Rate:Tachycardia     Neuro/Psych Anxiety Depression negative neurological ROS     GI/Hepatic Neg liver ROS, GERD  ,  Endo/Other  diabetes  Renal/GU negative Renal ROS     Musculoskeletal negative musculoskeletal ROS (+)   Abdominal   Peds  Hematology negative hematology ROS (+)   Anesthesia Other Findings   Reproductive/Obstetrics                            Anesthesia Physical Anesthesia Plan  ASA: 3 and emergent  Anesthesia Plan: General   Post-op Pain Management: Regional block, Tylenol PO (pre-op) and Celebrex PO (pre-op)   Induction: Intravenous  PONV Risk Score and Plan: 2 and Ondansetron and Midazolam  Airway Management Planned: LMA  Additional Equipment:   Intra-op Plan:   Post-operative Plan: Extubation in OR  Informed Consent: I have reviewed the patients History and Physical, chart, labs and discussed the procedure including the risks, benefits and alternatives for the proposed anesthesia with the patient or authorized representative who has indicated his/her understanding and acceptance.     Dental advisory given  Plan Discussed with: Anesthesiologist, CRNA and Surgeon  Anesthesia Plan Comments:        Anesthesia Quick Evaluation

## 2022-01-02 NOTE — Discharge Instructions (Addendum)
Diet: As you were doing prior to hospitalization   Shower:  May shower but keep the wounds dry, use an occlusive plastic wrap, NO SOAKING IN TUB.  If the bandage gets wet, change with a clean dry gauze.   Dressing:  You may change your dressing 3-5 days after surgery, unless you have a splint.  If the dressing remains clean and dry it can also be left on until follow up. If you change the dressing replace with clean gauze and tape or ace wrap.   Drain care: Make sure accordion drain remains compressed with negative pressure.  Empty accordion drain twice a day if there is any output, or more often if the drain fills up.  The drain may have no output which is okay.  Activity:  Increase activity slowly as tolerated, but follow the weight bearing instructions below.  The rules on driving is that you can not be taking narcotics while you drive, and you must feel in control of the vehicle.    Weight Bearing:   weight bearing as tolerated.    Antibiotics: Take the antibiotics that were prescribed to you from clinic.  We will modify the antibiotics based on your culture results.  We have also referred you to infectious disease for further guidance on antibiotic treatment.  Blood clot prevention (DVT Prophylaxis): After surgery you are at an increased risk for a blood clot. you were prescribed a blood thinner, aspirin 81mg , to be taken twice daily for a total of 2 weeks from surgery to help reduce your risk of getting a blood clot. This will help prevent a blood clot. Signs of a pulmonary embolus (blood clot in the lungs) include sudden short of breath, feeling lightheaded or dizzy, chest pain with a deep breath, rapid pulse rapid breathing. Signs of a blood clot in your arms or legs include new unexplained swelling and cramping, warm, red or darkened skin around the painful area. Please call the office or 911 right away if these signs or symptoms develop. To prevent constipation: you may use a stool softener  such as -  Colace (over the counter) 100 mg by mouth twice a day  Drink plenty of fluids (prune juice may be helpful) and high fiber foods Miralax (over the counter) for constipation as needed.    Itching:  If you experience itching with your medications, try taking only a single pain pill, or even half a pain pill at a time.  You may take up to 10 pain pills per day, and you can also use benadryl over the counter for itching or also to help with sleep.   Precautions:  If you experience chest pain or shortness of breath - call 911 immediately for transfer to the hospital emergency department!!   Call office [phone]) for the following: Temperature greater than 101F Persistent nausea and vomiting Severe uncontrolled pain Redness, tenderness, or signs of infection (pain, swelling, redness, odor or green/yellow discharge around the site) Difficulty breathing, headache or visual disturbances Hives Persistent dizziness or light-headedness Extreme fatigue Any other questions or concerns you may have after discharge  In an emergency, call 911 or go to an Emergency Department at a nearby hospital  Follow- Up Appointment:  Please call for an appointment to be seen this Friday by Dr. Tuesday. Anticipate drain will be removed at that visit.  Address: [address], [place], [place]   Post Anesthesia Home Care Instructions  Activity: Get plenty of rest for the  remainder of the day. A responsible individual must stay with you for 24 hours following the procedure.  For the next 24 hours, DO NOT: -Drive a car -Advertising copywriter -Drink alcoholic beverages -Take any medication unless instructed by your physician -Make any legal decisions or sign important papers.  Meals: Start with liquid foods such as gelatin or soup. Progress to regular foods as tolerated. Avoid greasy, spicy, heavy foods. If nausea and/or vomiting occur, drink only clear liquids until the nausea and/or  vomiting subsides. Call your physician if vomiting continues.  Special Instructions/Symptoms: Your throat may feel dry or sore from the anesthesia or the breathing tube placed in your throat during surgery. If this causes discomfort, gargle with warm salt water. The discomfort should disappear within 24 hours.  No Tylenol or Ibuprofen, Advil, Motrin, Aleve before PM today.  Regional Anesthesia Blocks  1. Numbness or the inability to move the "blocked" extremity may last from 3-48 hours after placement. The length of time depends on the medication injected and your individual response to the medication. If the numbness is not going away after 48 hours, call your surgeon.  2. The extremity that is blocked will need to be protected until the numbness is gone and the  Strength has returned. Because you cannot feel it, you will need to take extra care to avoid injury. Because it may be weak, you may have difficulty moving it or using it. You may not know what position it is in without looking at it while the block is in effect.  3. For blocks in the legs and feet, returning to weight bearing and walking needs to be done carefully. You will need to wait until the numbness is entirely gone and the strength has returned. You should be able to move your leg and foot normally before you try and bear weight or walk. You will need someone to be with you when you first try to ensure you do not fall and possibly risk injury.  4. Bruising and tenderness at the needle site are common side effects and will resolve in a few days.  5. Persistent numbness or new problems with movement should be communicated to the surgeon.

## 2022-01-02 NOTE — Progress Notes (Signed)
Assisted Dr. Singer with right, ultrasound guided, adductor canal block. Side rails up, monitors on throughout procedure. See vital signs in flow sheet. Tolerated Procedure well.  

## 2022-01-03 MED ORDER — METFORMIN HCL ER 500 MG PO TB24: 1000.0000 mg | ORAL_TABLET | Freq: Two times a day (BID) | ORAL | 0 refills | Status: DC

## 2022-01-03 NOTE — Addendum Note (Signed)
Addendum  created 01/03/22 0950 by Duane Boston, MD   Clinical Note Signed, Intraprocedure Blocks edited, SmartForm saved

## 2022-01-03 NOTE — Addendum Note (Signed)
Addendum  created 01/03/22 1221 by Suan Halter, CRNA   Charge Capture section accepted

## 2022-01-03 NOTE — Telephone Encounter (Signed)
From pt: Joseph [name], I have [name] surgery was out about a week from it and it got badly infected and had to go back in today for another surgery to clean out the knee. My refills for my Met [name], and all of her saying that [name] already refill them but I havent. Im actually totally out right now [name] them off to [employer] but Im not using [name]. I just been canceled my account but I do need my Met [name] and my glipizide so if its possible he could send them over in 90 days orders I would really appreciate it but like I said I am Im out of my Met [name] altogether.

## 2022-01-03 NOTE — Telephone Encounter (Signed)
Since patient was out of metformin and you saw him in 11/20/2021 sent in 90 day supply of metformin with no refills.

## 2022-01-04 ENCOUNTER — Other Ambulatory Visit (HOSPITAL_COMMUNITY): Payer: Self-pay | Admitting: Orthopedic Surgery

## 2022-01-04 ENCOUNTER — Encounter (HOSPITAL_COMMUNITY): Payer: Self-pay | Admitting: Radiology

## 2022-01-04 ENCOUNTER — Other Ambulatory Visit: Payer: Self-pay

## 2022-01-04 ENCOUNTER — Ambulatory Visit (HOSPITAL_COMMUNITY)
Admission: RE | Admit: 2022-01-04 | Discharge: 2022-01-04 | Disposition: A | Discharge status: Home / Self Care | Payer: Medicare Other | Source: Ambulatory Visit | Attending: Cardiology | Admitting: Cardiology

## 2022-01-04 ENCOUNTER — Encounter (HOSPITAL_BASED_OUTPATIENT_CLINIC_OR_DEPARTMENT_OTHER): Payer: Self-pay | Admitting: Orthopedic Surgery

## 2022-01-04 ENCOUNTER — Telehealth: Payer: Self-pay | Admitting: *Deleted

## 2022-01-04 DIAGNOSIS — M79604 Pain in right leg: Secondary | ICD-10-CM | POA: Insufficient documentation

## 2022-01-04 DIAGNOSIS — M7989 Other specified soft tissue disorders: Secondary | ICD-10-CM

## 2022-01-04 MED ORDER — GLIPIZIDE 10 MG PO TABS: 10.0000 mg | ORAL_TABLET | Freq: Two times a day (BID) | ORAL | 1 refills | Status: DC

## 2022-01-04 NOTE — Telephone Encounter (Incomplete Revision)
Patient in the office per murphy wainer and has a positive DVT. Call was placed to dr wainer's office by the pv tech and xarelto was called into the pharmacy. Dr Stanford Breed spoke to [facility] pa and they would like the patient seen for management of the DVT. Patient left the office before being seen. Left message for pt to call to schedule new patient appointment with any provider.

## 2022-01-04 NOTE — Telephone Encounter (Addendum)
Patient in the office per murphy wainer and has a positive DVT. Call was placed to dr wainer's office by the pv tech and xarelto was called into the pharmacy. Dr crenshaw spoke to [facility] pa and they would like the patient seen for management of the DVT. Patient left the office before being seen. Left message for pt to call to schedule new patient appointment with any provider. °

## 2022-01-04 NOTE — Progress Notes (Signed)
° ° °  Lab results from the right knee aspiration sample taken in our office showed growth of gram negative bacilli Serratia marcescens.  Prior to these lab results coming in I sent the patient Cipro 500mg  bid x 10 days and Bactrim DS 800-160 mg bid x 10 days to start taking post-op.   The patient should have an outpatient follow up with ID who can determine if that should be continued or changed.  He has a post-op follow up in our office today for a wound check and drain removal.    , PA-C Office [phone] 01/04/2022, 8:54 AM

## 2022-01-04 NOTE — Progress Notes (Unsigned)
Patient was seen at [facility] for right lower extremity venous duplex. Patient is positive for DVT. Preliminary report can be found under the CV Procedure tab in Epic.  Thank you,  Tyna Jaksch, RDMS, RVT

## 2022-01-07 NOTE — Telephone Encounter (Signed)
Left message for pt to call.

## 2022-01-07 NOTE — Progress Notes (Signed)
° ° ° °  Antibiotic Susceptibility for the office specimen we sent from the knee aspiration on 01/02/22   Amox/Clav      R >= 32 Cefazolin        R >= 64 Cefepime        S <= 1 Ceftazidime    S <= 1 Ceftriaxone     S <= 1 Ciproflox         S <= 0.25 Gent               S <= 1 Levoflox          S <= 0.12 Tobramycin     S <= 2 Bactrim            S <= 20   I sent the patient 10 days of Cipro and Bactrim on 01/02/22 which he has been taking without issues.     Jenne Pane, PA-C Office [phone] 01/07/2022, 2:28 PM

## 2022-01-08 LAB — AEROBIC/ANAEROBIC CULTURE W GRAM STAIN (SURGICAL/DEEP WOUND)

## 2022-01-09 ENCOUNTER — Encounter: Payer: Self-pay | Admitting: Infectious Disease

## 2022-01-09 DIAGNOSIS — I82409 Acute embolism and thrombosis of unspecified deep veins of unspecified lower extremity: Secondary | ICD-10-CM

## 2022-01-09 DIAGNOSIS — M009 Pyogenic arthritis, unspecified: Secondary | ICD-10-CM

## 2022-01-09 HISTORY — DX: Pyogenic arthritis, unspecified: M00.9

## 2022-01-09 HISTORY — DX: Acute embolism and thrombosis of unspecified deep veins of unspecified lower extremity: I82.409

## 2022-01-09 NOTE — Telephone Encounter (Signed)
Left message for pt to call Spoke with murphy wainer's office to let them know that the patient has not returned calls to schedule an appointment.

## 2022-01-11 ENCOUNTER — Other Ambulatory Visit: Payer: Self-pay

## 2022-01-11 ENCOUNTER — Ambulatory Visit (HOSPITAL_BASED_OUTPATIENT_CLINIC_OR_DEPARTMENT_OTHER): Payer: Medicare Other | Admitting: Infectious Disease

## 2022-01-11 ENCOUNTER — Telehealth: Payer: Self-pay

## 2022-01-11 ENCOUNTER — Encounter: Payer: Self-pay | Admitting: Infectious Disease

## 2022-01-11 VITALS — BP 120/74 | HR 101 | Temp 98.4°F

## 2022-01-11 DIAGNOSIS — M00861 Arthritis due to other bacteria, right knee: Secondary | ICD-10-CM

## 2022-01-11 DIAGNOSIS — Z794 Long term (current) use of insulin: Secondary | ICD-10-CM

## 2022-01-11 DIAGNOSIS — A488 Other specified bacterial diseases: Secondary | ICD-10-CM | POA: Diagnosis not present

## 2022-01-11 DIAGNOSIS — M00862 Arthritis due to other bacteria, left knee: Secondary | ICD-10-CM

## 2022-01-11 DIAGNOSIS — E1165 Type 2 diabetes mellitus with hyperglycemia: Secondary | ICD-10-CM | POA: Diagnosis not present

## 2022-01-11 DIAGNOSIS — M1712 Unilateral primary osteoarthritis, left knee: Secondary | ICD-10-CM

## 2022-01-11 DIAGNOSIS — I82409 Acute embolism and thrombosis of unspecified deep veins of unspecified lower extremity: Secondary | ICD-10-CM | POA: Diagnosis not present

## 2022-01-11 DIAGNOSIS — M2241 Chondromalacia patellae, right knee: Secondary | ICD-10-CM | POA: Diagnosis not present

## 2022-01-11 NOTE — Progress Notes (Signed)
Reason for infectious disease consult: Septic right knee  Requesting physician: Edmonia Lynch, MD   Subjective:       Patient ID: Joseph [name], male    DOB: [birth year], 51 y.o.   MRN: [id number]  HPI  Joseph [name] is a 51 year old Caucasian man with multiple medical problems including COPD diabetes mellitus osteoarthritis who underwent upper scopic right sided medial and lateral meniscectomy and chondroplasty with Dr. Percell Miller on December 20, 2021.  He had done well postoperatively but then developed acute swelling and pain in his knee that was so severe that he says when he tries to remember it now it gives him goosebumps the pain was so severe.  He is also febrile to 101 degrees with nausea and vomiting.  Knee was aspirated in the clinic Dr. Percell Miller and [phone] cells were encountered along which were 93% neutrophilic.  Cultures aspirate yielded a Serratia marcescens resistant to Augmentin and cefazolin but sensitive to cefepime ceftazidime ceftriaxone ciprofloxacin gentamicin levofloxacin tobramycin and Bactrim.  Brought to the operating room on January 02, 2022 and underwent arthroscopic right knee irrigation and debridement with purulent material having been removed and culture sent which again yielded Serratia.  Patient was found postoperatively to have a deep venous thrombosis and is now on anticoagulation with Xarelto.  When his culture data came back in ciprofloxacin and Bactrim were prescribed which he has been on since then.  He has noted his blood sugars fluctuating more than usual recently which may be due to the fluoroquinolone.  Pain in the knee is improved but he still has severe pain and swelling in his right leg where he has a deep venous thrombosis.     Past Medical History:  Diagnosis Date   Acute encephalopathy 03/24/2018   Anxiety    Arthritis    oa left knee and lower back   Asthma    Cataract    hx of bilateral   Chronic back pain    Chronic pain of left  knee    COPD (chronic obstructive pulmonary disease) (HCC)    Diabetes mellitus    type 2   DVT (deep venous thrombosis) ([place]) 01/09/2022   Encephalopathy acute 03/24/2018   GERD (gastroesophageal reflux disease)    INGUINAL PAIN, LEFT 10/03/2009   Qualifier: Diagnosis of  By: Oneida Alar MD, Sandi L    Oral thrush 11/10/2018   Overdose    03-24-18 accidental   Rash and nonspecific skin eruption 03/12/2017   Septic arthritis ([place]) 01/09/2022   Shortness of breath    with heavy exertion   Sleep apnea     Past Surgical History:  Procedure Laterality Date   CARPAL TUNNEL RELEASE Left    CATARACT EXTRACTION W/PHACO Right 03/11/2016   Procedure: CATARACT EXTRACTION PHACO AND INTRAOCULAR LENS PLACEMENT ([place]);  Surgeon: Tonny Branch, MD;  Location: [facility];  Service: Ophthalmology;  Laterality: Right;  CDE 4.24   EYE SURGERY Right 2018   ioc with lens replacement   HERNIA REPAIR Bilateral 2671   umbilical   INCISION AND DRAINAGE ABSCESS Right 01/02/2022   Procedure: RIGHT KNEE ARTHROSCOPIC IRRIGATION AND DEBRIDEMENT;  Surgeon: Willaim Sheng, MD;  Location: [place];  Service: Orthopedics;  Laterality: Right;   KNEE ARTHROSCOPY WITH MEDIAL MENISECTOMY Left 04/22/2014   Procedure: LEFT KNEE ARTHROSCOPY WITH MEDIAL MENISECTOMY;  Surgeon: Carole Civil, MD;  Location: [facility];  Service: Orthopedics;  Laterality: Left;   KNEE ARTHROSCOPY WITH MEDIAL MENISECTOMY Right 12/06/2015   Procedure: RIGHT  KNEE ARTHROSCOPY WITH MEDIAL MENISECTOMY;  Surgeon: Carole Civil, MD;  Location: [facility];  Service: Orthopedics;  Laterality: Right;   KNEE ARTHROSCOPY WITH MEDIAL MENISECTOMY Left 03/20/2017   Procedure: KNEE ARTHROSCOPY WITH MEDIAL MENISECTOMY;  Surgeon: Carole Civil, MD;  Location: [facility];  Service: Orthopedics;  Laterality: Left;   ORIF WRIST FRACTURE Left 12/19/2016   Procedure: OPEN REDUCTION INTERNAL FIXATION (ORIF) WRIST FRACTURE;  Surgeon: Leanora Cover, MD;  Location: [facility];  Service: Orthopedics;  Laterality: Left;  accumed    PARTIAL KNEE ARTHROPLASTY Left 07/21/2018   Procedure: LEFT UNICOMPARTMENTAL KNEE;  Surgeon: Renette Butters, MD;  Location: [facility];  Service: Orthopedics;  Laterality: Left;   SHOULDER ARTHROCENTESIS Right 2008 or 2010   VASECTOMY  2014    Family History  Problem Relation Age of Onset   Emphysema Maternal Grandfather    Emphysema Maternal Grandmother    Clotting disorder Maternal Grandmother       Social History   Socioeconomic History   Marital status: Divorced    Spouse name: Not on file   Number of children: Not on file   Years of education: Not on file   Highest education level: Not on file  Occupational History   Occupation: disability  Tobacco Use   Smoking status: Every Day    Packs/day: 1.25    Years: 26.00    Pack years: 32.50    Types: Cigarettes    Start date: 12/17/1993   Smokeless tobacco: Never   Tobacco comments:    1 ppd 01/04/2020  Vaping Use   Vaping Use: Former  Substance and Sexual Activity   Alcohol use: Never    Alcohol/week: 0.0 standard drinks   Drug use: Never   Sexual activity: Yes    Birth control/protection: Surgical  Other Topics Concern   Not on file  Social History Narrative   Recent visit to [facility] in [place] d/t increased pain where he was prescribed percocet 7.5-325 mg for pain.   Social Determinants of Health   Financial Resource Strain: Not on file  Food Insecurity: Not on file  Transportation Needs: Not on file  Physical Activity: Not on file  Stress: Not on file  Social Connections: Not on file    Allergies  Allergen Reactions   Azithromycin Hives   Clarithromycin    Erythromycin Hives   Keflex [Cephalexin] Nausea Only   Metformin Nausea And Vomiting   Symbicort [Budesonide-Formoterol Fumarate] Other (See Comments)    States that 3 doses were used and breathing became worse     Current Outpatient Medications:    albuterol (PROVENTIL)  (2.5 MG/3ML) 0.083% nebulizer solution, USE 3 ML(1 VIAL) VIA NEBULIZER FOUR TIMES DAILY AS NEEDED FOR SHORTNESS OF BREATH, Disp: 360 mL, Rfl: 0   ANDROGEL PUMP 20.25 MG/ACT (1.62%) GEL, Apply 2 Pump topically daily. , Disp: , Rfl:    aspirin EC 81 MG tablet, Take 1 tablet (81 mg total) by mouth 2 (two) times daily for 28 days. Swallow whole., Disp: 56 tablet, Rfl: 0   atorvastatin (LIPITOR) 40 MG tablet, TAKE ONE TABLET (40MG TOTAL) BY MOUTH DAILY FOR CHOLESTEROL, Disp: 90 tablet, Rfl: 3   benzonatate (TESSALON) 200 MG capsule, Take 1 capsule (200 mg total) by mouth 3 (three) times daily as needed for cough., Disp: 15 capsule, Rfl: 0   blood glucose meter kit and supplies KIT, Dispense based on patient and insurance preference. Use up to four times daily as directed. (FOR ICD-9  250.00, 250.01)., Disp: 1 each, Rfl: 0   buPROPion (WELLBUTRIN SR) 150 MG 12 hr tablet, Take 1 tablet (150 mg total) by mouth 2 (two) times daily. For anxiety and depression., Disp: 180 tablet, Rfl: 3   cetirizine (ZYRTEC) 10 MG tablet, Take 1 tablet by mouth at bedtime for allergies., Disp: 90 tablet, Rfl: 2   citalopram (CELEXA) 40 MG tablet, Take 1 tablet (40 mg total) by mouth daily. For anxiety and depression., Disp: 90 tablet, Rfl: 3   Continuous Blood Gluc Sensor (FREESTYLE LIBRE 3 SENSOR) MISC, 1 Units by Does not apply route 4 (four) times daily. Place 1 sensor on the skin every 14 days. Use to check glucose continuously, Disp: 6 each, Rfl: 3   cyclobenzaprine (FLEXERIL) 10 MG tablet, Take 10 mg by mouth 2 (two) times daily as needed., Disp: , Rfl:    doxycycline (VIBRA-TABS) 100 MG tablet, Take 1 tablet (100 mg total) by mouth 2 (two) times daily., Disp: 14 tablet, Rfl: 0   empagliflozin (JARDIANCE) 10 MG TABS tablet, Take 1 tablet (10 mg total) by mouth daily. For diabetes., Disp: 90 tablet, Rfl: 1   fluticasone (FLONASE) 50 MCG/ACT nasal spray, Place 2 sprays into both nostrils daily as needed for allergies or  rhinitis., Disp: 48 g, Rfl: 3   fluticasone furoate-vilanterol (BREO ELLIPTA) 200-25 MCG/INH AEPB, Inhale 1 puff into the lungs daily., Disp: 90 each, Rfl: 3   gabapentin (NEURONTIN) 300 MG capsule, Take 2 capsules (600 mg total) by mouth 2 (two) times daily. For pain., Disp: 360 capsule, Rfl: 2   glipiZIDE (GLUCOTROL) 10 MG tablet, Take 1 tablet (10 mg total) by mouth 2 (two) times daily before a meal. For diabetes., Disp: 180 tablet, Rfl: 1   insulin degludec (TRESIBA) 100 UNIT/ML FlexTouch Pen, Inject 35 Units into the skin daily. For diabetes., Disp: 30 mL, Rfl: 1   Insulin Pen Needle (UNIFINE PENTIPS) 31G X 6 MM MISC, USE NIGHTLY WITH INSULIN, Disp: 100 each, Rfl: 3   Ipratropium-Albuterol (COMBIVENT RESPIMAT) 20-100 MCG/ACT AERS respimat, INHALE ONE PUFF INTO THE LUNGS EVERY FOUR HOURS AS NEEDED FOR WHEEZING, Disp: 4 g, Rfl: 0   Lancets (ONETOUCH DELICA PLUS [id number]) MISC, USE TO CHECK BLOOD SUGAR UP TO FOUR TIMES DAILY, Disp: 100 each, Rfl: 5   lansoprazole (PREVACID) 30 MG capsule, Take 1 capsule (30 mg total) by mouth 2 (two) times daily before a meal. For heartburn., Disp: 180 capsule, Rfl: 3   metFORMIN (GLUCOPHAGE-XR) 500 MG 24 hr tablet, Take 2 tablets (1,000 mg total) by mouth 2 (two) times daily with a meal. For diabetes., Disp: 360 tablet, Rfl: 0   MOVANTIK 25 MG TABS tablet, Take 25 mg by mouth every morning., Disp: , Rfl:    NUCYNTA 75 MG tablet, Take 75 mg by mouth 3 (three) times daily as needed., Disp: , Rfl:    ONETOUCH VERIO test strip, USE TO CHECK BLOOD SUGAR UP TO FOUR TIMES DAILY, Disp: 100 strip, Rfl: 5   tamsulosin (FLOMAX) 0.4 MG CAPS capsule, TAKE ONE CAPSULE BY MOUTH DAILY, Disp: 90 capsule, Rfl: 1   Turmeric 500 MG CAPS, Take 1 capsule by mouth daily., Disp: , Rfl:    zolpidem (AMBIEN) 10 MG tablet, TAKE ONE TABLET (10MG TOTAL) BY MOUTH ATBEDTIME AS NEEDED FOR SLEEP, Disp: 90 tablet, Rfl: 0   Review of Systems  Constitutional:  Negative for chills and fever.   HENT:  Negative for congestion and sore throat.   Eyes:  Negative for  photophobia.  Respiratory:  Negative for cough, shortness of breath and wheezing.   Cardiovascular:  Negative for chest pain, palpitations and leg swelling.  Gastrointestinal:  Negative for abdominal pain, blood in stool, constipation, diarrhea, nausea and vomiting.  Genitourinary:  Negative for dysuria, flank pain and hematuria.  Musculoskeletal:  Positive for joint swelling and myalgias. Negative for back pain.  Skin:  Negative for color change and rash.  Neurological:  Negative for dizziness, weakness and headaches.  Hematological:  Does not bruise/bleed easily.  Psychiatric/Behavioral:  Negative for agitation, behavioral problems, confusion, decreased concentration, dysphoric mood, hallucinations, self-injury and suicidal ideas.       Objective:   Physical Exam Constitutional:      Appearance: He is well-developed.  HENT:     Head: Normocephalic and atraumatic.  Eyes:     Conjunctiva/sclera: Conjunctivae normal.  Cardiovascular:     Rate and Rhythm: Normal rate and regular rhythm.  Pulmonary:     Effort: Pulmonary effort is normal. No respiratory distress.     Breath sounds: No wheezing.  Abdominal:     Palpations: Abdomen is soft.  Musculoskeletal:        General: Swelling and tenderness present.     Cervical back: Normal range of motion and neck supple.     Right lower leg: Edema present.  Skin:    General: Skin is warm and dry.     Coloration: Skin is not pale.     Findings: No erythema or rash.  Neurological:     General: No focal deficit present.     Mental Status: He is alert and oriented to person, place, and time.  Psychiatric:        Mood and Affect: Mood normal.        Behavior: Behavior normal.        Thought Content: Thought content normal.        Judgment: Judgment normal.          Assessment & Plan:   Right septic arthritis:  We will go for textbook treatment of this with an  intravenous antibiotic.  Placed orders for PICC line to be placed.  We will go with ceftriaxone 2 g IV x42 days.  In the interim I am checking BMP with GFR and CBC with differential sed rate and CRP.  Provided his potassium and creatinine continue to be stable I would recommend going with the double strength Bactrim twice daily and dropping the ciprofloxacin to avoid the problems with his blood sugar control and to have a less risk antibiotic in terms of C. difficile colitis.  Deep venous thrombosis he remains on Xarelto.  Diabetes mellitus: Most recent A1c was 8.9 when checked in November 2022.  He is sugars seem to be fluctuant more potentially due to the fluoroquinolone.  He follows with Loma Boston, NP  COPD has followup with Dr. Melvyn Novas  We will drop this and continue Bactrim until we get IV placed and pleat 6 weeks of IV ceftriaxone.  Vaccine counseling: He has had his annual flu shot he does not want to be vaccinated against COVID-19.  Diagnosis: Septic knee Culture Result: Serratia marcescens  Allergies  Allergen Reactions   Azithromycin Hives   Clarithromycin    Erythromycin Hives   Keflex [Cephalexin] Nausea Only   Metformin Nausea And Vomiting   Symbicort [Budesonide-Formoterol Fumarate] Other (See Comments)    States that 3 doses were used and breathing became worse    OPAT Orders Discharge antibiotics to be  given via PICC line Discharge antibiotics: Ceftriaxone 2 g IV daily   Duration:  6 weeks  End Date:  42 days from PICC line placement  [facility] Care Per Protocol:  Home health RN for IV administration and teaching; PICC line care and labs.    Labs weekly while on IV antibiotics: _x_ CBC with differential _x_ BMP  _x_ CRP _x_ ESR _  x__ Please pull PIC at completion of IV antibiotics __ Please leave PIC in place until doctor has seen patient or been notified  Fax weekly labs to [phone]  I spent 82 minutes with the patient including  than 50% of the time in face to face counseling of the patient and his fiance were in the nature of septic knee infections and how we treat them with joint surgical and medical therapy, conventional treatment with IV antibiotics versus less conventional treatment with oral therapy personally reviewing his culture data from [facility] office as well as from the operating room here at [facility] health, CBC CMP along with review of medical records in preparation for the visit and during the visit and in coordination of her care.

## 2022-01-11 NOTE — Telephone Encounter (Signed)
Waiting to receive a message back from IR to schedule patient for picc line. Patient will also need to have appt with short stay for first dose of ceftriaxone.  Philippa Chester, CMA

## 2022-01-12 LAB — BASIC METABOLIC PANEL WITH GFR
BUN: 11 mg/dL (ref 7–25)
CO2: 23 mmol/L (ref 20–32)
Calcium: 8.9 mg/dL (ref 8.6–10.3)
Chloride: 96 mmol/L — ABNORMAL LOW (ref 98–110)
Creat: 0.93 mg/dL (ref 0.70–1.30)
Glucose, Bld: 398 mg/dL — ABNORMAL HIGH (ref 65–99)
Potassium: 4.5 mmol/L (ref 3.5–5.3)
Sodium: 132 mmol/L — ABNORMAL LOW (ref 135–146)
eGFR: 99 mL/min/{1.73_m2} (ref >=60)

## 2022-01-12 LAB — CBC WITH DIFFERENTIAL/PLATELET
Absolute Monocytes: 903 cells/uL (ref 200–950)
Basophils Absolute: 116 cells/uL (ref 0–200)
Basophils Relative: 0.9 %
Eosinophils Absolute: 335 cells/uL (ref 15–500)
Eosinophils Relative: 2.6 %
HCT: 43.2 % (ref 38.5–50.0)
Hemoglobin: 14.3 g/dL (ref 13.2–17.1)
Lymphs Abs: 1819 cells/uL (ref 850–3900)
MCH: 29.1 pg (ref 27.0–33.0)
MCHC: 33.1 g/dL (ref 32.0–36.0)
MCV: 88 fL (ref 80.0–100.0)
MPV: 9.9 fL (ref 7.5–12.5)
Monocytes Relative: 7 %
Neutro Abs: 9727 cells/uL — ABNORMAL HIGH (ref 1500–7800)
Neutrophils Relative %: 75.4 %
Platelets: 387 10*3/uL (ref 140–400)
RBC: 4.91 10*6/uL (ref 4.20–5.80)
RDW: 13.1 % (ref 11.0–15.0)
Total Lymphocyte: 14.1 %
WBC: 12.9 10*3/uL — ABNORMAL HIGH (ref 3.8–10.8)

## 2022-01-12 LAB — SEDIMENTATION RATE: Sed Rate: 110 mm/h — ABNORMAL HIGH (ref 0–20)

## 2022-01-12 LAB — C-REACTIVE PROTEIN: CRP: 89.6 mg/L — ABNORMAL HIGH (ref <8.0)

## 2022-01-14 ENCOUNTER — Telehealth: Payer: Self-pay

## 2022-01-14 ENCOUNTER — Other Ambulatory Visit: Payer: Self-pay

## 2022-01-14 DIAGNOSIS — M00861 Arthritis due to other bacteria, right knee: Secondary | ICD-10-CM

## 2022-01-14 NOTE — Telephone Encounter (Signed)
Sarah with [employer] [facility] [phone] ext [phone]) left a message stating she needed to verify that patient is scheduled for picc line placement on 01/15/22. I attempted to call Maralyn Sago back and was unable to get her on the phone. I have left a voicemail with details letter her know patient's picc line placement information.  Zannah Melucci T Pricilla Loveless

## 2022-01-15 ENCOUNTER — Ambulatory Visit (INDEPENDENT_AMBULATORY_CARE_PROVIDER_SITE_OTHER): Payer: Medicare Other

## 2022-01-15 ENCOUNTER — Other Ambulatory Visit: Payer: Self-pay | Admitting: Infectious Disease

## 2022-01-15 ENCOUNTER — Ambulatory Visit (HOSPITAL_COMMUNITY)
Admission: RE | Admit: 2022-01-15 | Discharge: 2022-01-15 | Disposition: A | Discharge status: Home / Self Care | Payer: Medicare Other | Source: Ambulatory Visit | Attending: Infectious Disease | Admitting: Infectious Disease

## 2022-01-15 ENCOUNTER — Other Ambulatory Visit: Payer: Self-pay

## 2022-01-15 VITALS — BP 117/72 | HR 90 | Temp 99.3°F | Resp 20 | Ht 70.0 in | Wt 245.0 lb

## 2022-01-15 DIAGNOSIS — Z452 Encounter for adjustment and management of vascular access device: Secondary | ICD-10-CM | POA: Diagnosis not present

## 2022-01-15 DIAGNOSIS — M00862 Arthritis due to other bacteria, left knee: Secondary | ICD-10-CM

## 2022-01-15 DIAGNOSIS — M00861 Arthritis due to other bacteria, right knee: Secondary | ICD-10-CM

## 2022-01-15 MED ORDER — ANTICOAGULANT SODIUM CITRATE 4% (200MG/5ML) IV SOLN: 5.0000 mL | Freq: Once | Status: DC | PRN

## 2022-01-15 MED ORDER — ALTEPLASE 2 MG IJ SOLR: 2.0000 mg | Freq: Once | INTRAMUSCULAR | Status: DC | PRN

## 2022-01-15 MED ORDER — SODIUM CHLORIDE 0.9 % IV SOLN: Freq: Once | INTRAVENOUS | Status: DC | PRN

## 2022-01-15 MED ORDER — SODIUM CHLORIDE 0.9% FLUSH: 10.0000 mL | Freq: Once | INTRAVENOUS | Status: DC | PRN

## 2022-01-15 MED ORDER — FAMOTIDINE IN NACL 20-0.9 MG/50ML-% IV SOLN: 20.0000 mg | Freq: Once | INTRAVENOUS | Status: DC | PRN

## 2022-01-15 MED ORDER — LIDOCAINE HCL 1 % IJ SOLN
INTRAMUSCULAR | Status: AC
  Filled 2022-01-15: qty 20

## 2022-01-15 MED ORDER — EPINEPHRINE 0.3 MG/0.3ML IJ SOAJ: 0.3000 mg | Freq: Once | INTRAMUSCULAR | Status: DC | PRN

## 2022-01-15 MED ORDER — SODIUM CHLORIDE 0.9% FLUSH: 3.0000 mL | Freq: Once | INTRAVENOUS | Status: DC | PRN

## 2022-01-15 MED ORDER — DIPHENHYDRAMINE HCL 50 MG/ML IJ SOLN: 50.0000 mg | Freq: Once | INTRAMUSCULAR | Status: DC | PRN

## 2022-01-15 MED ORDER — HEPARIN SOD (PORK) LOCK FLUSH 100 UNIT/ML IV SOLN
INTRAVENOUS | Status: DC | PRN
  Administered 2022-01-15: 500 [IU] via INTRAVENOUS

## 2022-01-15 MED ORDER — ALBUTEROL SULFATE HFA 108 (90 BASE) MCG/ACT IN AERS: 2.0000 | INHALATION_SPRAY | Freq: Once | RESPIRATORY_TRACT | Status: DC | PRN

## 2022-01-15 MED ORDER — LIDOCAINE HCL 1 % IJ SOLN
INTRAMUSCULAR | Status: DC | PRN
  Administered 2022-01-15: 10 mL

## 2022-01-15 MED ORDER — METHYLPREDNISOLONE SODIUM SUCC 125 MG IJ SOLR: 125.0000 mg | Freq: Once | INTRAMUSCULAR | Status: DC | PRN

## 2022-01-15 MED ORDER — HEPARIN SOD (PORK) LOCK FLUSH 100 UNIT/ML IV SOLN
250.0000 [IU] | Freq: Once | INTRAVENOUS | Status: AC | PRN
  Administered 2022-01-15: 250 [IU]
  Filled 2022-01-15: qty 5

## 2022-01-15 MED ORDER — HEPARIN SOD (PORK) LOCK FLUSH 100 UNIT/ML IV SOLN: 500.0000 [IU] | Freq: Once | INTRAVENOUS | Status: DC | PRN

## 2022-01-15 MED ORDER — HEPARIN SOD (PORK) LOCK FLUSH 100 UNIT/ML IV SOLN
INTRAVENOUS | Status: AC
  Filled 2022-01-15: qty 5

## 2022-01-15 MED ORDER — SODIUM CHLORIDE 0.9 % IV SOLN
2.0000 g | INTRAVENOUS | Status: DC
  Administered 2022-01-15: 2 g via INTRAVENOUS
  Filled 2022-01-15: qty 20

## 2022-01-15 NOTE — Progress Notes (Signed)
Diagnosis: Arthritis of Rt knee  Provider:  Marshell Garfinkel, MD  Procedure: Infusion  IV Type: PICC, IV Location: R Upper Arm  Rocephin, Dose: 2000 mg  Infusion Start Time: 13.48 01/15/2022  Infusion Stop Time: 14.19 01/15/2022  Post Infusion IV Care: PICC Line Flushed/Capped PICC line flushed with 20 ml N/S flush and heparin locked. Small amount of blood noted at the insertion site. Dressing intact.30 minutes observation completed with no any S/S of distress.  Discharge: Condition: Good, Destination: Home . AVS provided to patient.   Performed by:  Arnoldo Morale, RN

## 2022-01-15 NOTE — Procedures (Signed)
Successful placement of single lumen PICC line to right basilic vein. Length 41cm Tip at lower SVC/RA PICC capped No complications Ready for use.  EBL < 5 mL   Ainara Eldridge, PA-C 01/15/2022 10:22 AM

## 2022-01-16 DIAGNOSIS — Z7901 Long term (current) use of anticoagulants: Secondary | ICD-10-CM | POA: Diagnosis not present

## 2022-01-16 DIAGNOSIS — J449 Chronic obstructive pulmonary disease, unspecified: Secondary | ICD-10-CM | POA: Diagnosis not present

## 2022-01-16 DIAGNOSIS — F17210 Nicotine dependence, cigarettes, uncomplicated: Secondary | ICD-10-CM | POA: Diagnosis not present

## 2022-01-16 DIAGNOSIS — Z792 Long term (current) use of antibiotics: Secondary | ICD-10-CM | POA: Diagnosis not present

## 2022-01-16 DIAGNOSIS — M00861 Arthritis due to other bacteria, right knee: Secondary | ICD-10-CM | POA: Diagnosis not present

## 2022-01-16 DIAGNOSIS — Z79891 Long term (current) use of opiate analgesic: Secondary | ICD-10-CM | POA: Diagnosis not present

## 2022-01-16 DIAGNOSIS — Z7951 Long term (current) use of inhaled steroids: Secondary | ICD-10-CM | POA: Diagnosis not present

## 2022-01-16 DIAGNOSIS — M1990 Unspecified osteoarthritis, unspecified site: Secondary | ICD-10-CM | POA: Diagnosis not present

## 2022-01-16 DIAGNOSIS — E119 Type 2 diabetes mellitus without complications: Secondary | ICD-10-CM | POA: Diagnosis not present

## 2022-01-16 DIAGNOSIS — Z7982 Long term (current) use of aspirin: Secondary | ICD-10-CM | POA: Diagnosis not present

## 2022-01-16 DIAGNOSIS — Z7984 Long term (current) use of oral hypoglycemic drugs: Secondary | ICD-10-CM | POA: Diagnosis not present

## 2022-01-16 DIAGNOSIS — K219 Gastro-esophageal reflux disease without esophagitis: Secondary | ICD-10-CM | POA: Diagnosis not present

## 2022-01-16 DIAGNOSIS — Z794 Long term (current) use of insulin: Secondary | ICD-10-CM | POA: Diagnosis not present

## 2022-01-16 DIAGNOSIS — Z5181 Encounter for therapeutic drug level monitoring: Secondary | ICD-10-CM | POA: Diagnosis not present

## 2022-01-16 DIAGNOSIS — I82401 Acute embolism and thrombosis of unspecified deep veins of right lower extremity: Secondary | ICD-10-CM | POA: Diagnosis not present

## 2022-01-16 DIAGNOSIS — F419 Anxiety disorder, unspecified: Secondary | ICD-10-CM | POA: Diagnosis not present

## 2022-01-16 DIAGNOSIS — B9689 Other specified bacterial agents as the cause of diseases classified elsewhere: Secondary | ICD-10-CM | POA: Diagnosis not present

## 2022-01-16 DIAGNOSIS — G4730 Sleep apnea, unspecified: Secondary | ICD-10-CM | POA: Diagnosis not present

## 2022-01-16 DIAGNOSIS — Z452 Encounter for adjustment and management of vascular access device: Secondary | ICD-10-CM | POA: Diagnosis not present

## 2022-01-18 ENCOUNTER — Telehealth: Payer: Self-pay

## 2022-01-18 NOTE — Telephone Encounter (Signed)
Patient called, PICC line placed 2/14. He reports that the insertion site is bleeding "more than normal" and he complains of pain in the arm. His home health nurse looked at the site and instructed him to call our office.Patient is currently on Xarelto being treated for a blood clot. Due to continued bleeding and history of blood clots, RN advised patient to seek evaluation in the emergency department. Patient verbalized understanding and has no further questions.   Sandie Ano, RN

## 2022-01-21 DIAGNOSIS — J449 Chronic obstructive pulmonary disease, unspecified: Secondary | ICD-10-CM | POA: Diagnosis not present

## 2022-01-21 DIAGNOSIS — I82401 Acute embolism and thrombosis of unspecified deep veins of right lower extremity: Secondary | ICD-10-CM | POA: Diagnosis not present

## 2022-01-21 DIAGNOSIS — M00861 Arthritis due to other bacteria, right knee: Secondary | ICD-10-CM | POA: Diagnosis not present

## 2022-01-21 DIAGNOSIS — M1990 Unspecified osteoarthritis, unspecified site: Secondary | ICD-10-CM | POA: Diagnosis not present

## 2022-01-21 DIAGNOSIS — B9689 Other specified bacterial agents as the cause of diseases classified elsewhere: Secondary | ICD-10-CM | POA: Diagnosis not present

## 2022-01-21 DIAGNOSIS — E119 Type 2 diabetes mellitus without complications: Secondary | ICD-10-CM | POA: Diagnosis not present

## 2022-01-21 NOTE — Progress Notes (Signed)
[facility] [place]  ASSESSMENT / PLAN: 51 y.o. male with provoked left calf muscular vein deep venous thrombosis after orthopedic surgery.  The patient has been appropriately maintained on Xarelto.  I counseled him to continue Xarelto for 3 months total.  He can hold Xarelto should the need arise for an intervention.  He can follow-up with me as needed.  CHIEF COMPLAINT: Deep venous thrombosis  HISTORY OF PRESENT ILLNESS: Joseph [name] is a 51 y.o. male referred to clinic after discovery of a gastrocnemius vein deep venous thrombosis after orthopedic surgery.  The patient reports he developed calf pain after the washout of his knee after arthroscopic surgery.  A duplex ultrasound was performed and revealed gastrocnemius thrombosis.  The patient has started his Xarelto "starter pack."  He has not had much change in his symptoms.  The bulk of our visit was spent discussing the rationale for anticoagulation, natural history of deep venous thrombosis, and the need for continue anticoagulation for 3 months.  Past Medical History:  Diagnosis Date   Acute encephalopathy 03/24/2018   Anxiety    Arthritis    oa left knee and lower back   Asthma    Cataract    hx of bilateral   Chronic back pain    Chronic pain of left knee    COPD (chronic obstructive pulmonary disease) (HCC)    Diabetes mellitus    type 2   DVT (deep venous thrombosis) ([place]) 01/09/2022   Encephalopathy acute 03/24/2018   GERD (gastroesophageal reflux disease)    INGUINAL PAIN, LEFT 10/03/2009   Qualifier: Diagnosis of  By: Oneida Alar MD, Sandi L    Oral thrush 11/10/2018   Overdose    03-24-18 accidental   Rash and nonspecific skin eruption 03/12/2017   Septic arthritis ([place]) 01/09/2022   Shortness of breath    with heavy exertion   Sleep apnea     Past Surgical History:  Procedure Laterality Date   CARPAL TUNNEL RELEASE Left    CATARACT EXTRACTION W/PHACO Right 03/11/2016   Procedure: CATARACT  EXTRACTION PHACO AND INTRAOCULAR LENS PLACEMENT ([place]);  Surgeon: Tonny Branch, MD;  Location: [facility];  Service: Ophthalmology;  Laterality: Right;  CDE 4.24   EYE SURGERY Right 2018   ioc with lens replacement   HERNIA REPAIR Bilateral 7902   umbilical   INCISION AND DRAINAGE ABSCESS Right 01/02/2022   Procedure: RIGHT KNEE ARTHROSCOPIC IRRIGATION AND DEBRIDEMENT;  Surgeon: Willaim Sheng, MD;  Location: [facility];  Service: Orthopedics;  Laterality: Right;   KNEE ARTHROSCOPY WITH MEDIAL MENISECTOMY Left 04/22/2014   Procedure: LEFT KNEE ARTHROSCOPY WITH MEDIAL MENISECTOMY;  Surgeon: Carole Civil, MD;  Location: [facility];  Service: Orthopedics;  Laterality: Left;   KNEE ARTHROSCOPY WITH MEDIAL MENISECTOMY Right 12/06/2015   Procedure: RIGHT KNEE ARTHROSCOPY WITH MEDIAL MENISECTOMY;  Surgeon: Carole Civil, MD;  Location: [facility];  Service: Orthopedics;  Laterality: Right;   KNEE ARTHROSCOPY WITH MEDIAL MENISECTOMY Left 03/20/2017   Procedure: KNEE ARTHROSCOPY WITH MEDIAL MENISECTOMY;  Surgeon: Carole Civil, MD;  Location: [facility];  Service: Orthopedics;  Laterality: Left;   ORIF WRIST FRACTURE Left 12/19/2016   Procedure: OPEN REDUCTION INTERNAL FIXATION (ORIF) WRIST FRACTURE;  Surgeon: Leanora Cover, MD;  Location: [facility];  Service: Orthopedics;  Laterality: Left;  accumed    PARTIAL KNEE ARTHROPLASTY Left 07/21/2018   Procedure: LEFT UNICOMPARTMENTAL KNEE;  Surgeon: Renette Butters, MD;  Location: [facility];  Service:  Orthopedics;  Laterality: Left;   SHOULDER ARTHROCENTESIS Right 2008 or 2010   VASECTOMY  2014    Family History  Problem Relation Age of Onset   Emphysema Maternal Grandfather    Emphysema Maternal Grandmother    Clotting disorder Maternal Grandmother     Social History   Socioeconomic History   Marital status: Divorced    Spouse name: Not on file   Number of children: Not on file   Years of education: Not on file   Highest  education level: Not on file  Occupational History   Occupation: disability  Tobacco Use   Smoking status: Every Day    Packs/day: 1.25    Years: 26.00    Pack years: 32.50    Types: Cigarettes    Start date: 12/17/1993   Smokeless tobacco: Never   Tobacco comments:    1 ppd 01/04/2020  Vaping Use   Vaping Use: Former  Substance and Sexual Activity   Alcohol use: Never    Alcohol/week: 0.0 standard drinks   Drug use: Never   Sexual activity: Yes    Birth control/protection: Surgical  Other Topics Concern   Not on file  Social History Narrative   Recent visit to [facility] in [place] d/t increased pain where he was prescribed percocet 7.5-325 mg for pain.   Social Determinants of Health   Financial Resource Strain: Not on file  Food Insecurity: Not on file  Transportation Needs: Not on file  Physical Activity: Not on file  Stress: Not on file  Social Connections: Not on file  Intimate Partner Violence: Not on file    Allergies  Allergen Reactions   Azithromycin Hives   Clarithromycin    Erythromycin Hives   Keflex [Cephalexin] Nausea Only   Metformin Nausea And Vomiting   Symbicort [Budesonide-Formoterol Fumarate] Other (See Comments)    States that 3 doses were used and breathing became worse    Current Outpatient Medications  Medication Sig Dispense Refill   albuterol (PROVENTIL) (2.5 MG/3ML) 0.083% nebulizer solution USE 3 ML(1 VIAL) VIA NEBULIZER FOUR TIMES DAILY AS NEEDED FOR SHORTNESS OF BREATH 360 mL 0   ANDROGEL PUMP 20.25 MG/ACT (1.62%) GEL Apply 2 Pump topically daily.      atorvastatin (LIPITOR) 40 MG tablet TAKE ONE TABLET (40MG TOTAL) BY MOUTH DAILY FOR CHOLESTEROL 90 tablet 3   benzonatate (TESSALON) 200 MG capsule Take 1 capsule (200 mg total) by mouth 3 (three) times daily as needed for cough. 15 capsule 0   blood glucose meter kit and supplies KIT Dispense based on patient and insurance preference. Use up to four times daily as directed. (FOR ICD-9  250.00, 250.01). 1 each 0   buPROPion (WELLBUTRIN SR) 150 MG 12 hr tablet Take 1 tablet (150 mg total) by mouth 2 (two) times daily. For anxiety and depression. 180 tablet 3   cefTRIAXone (ROCEPHIN) 10 g injection      cetirizine (ZYRTEC) 10 MG tablet Take 1 tablet by mouth at bedtime for allergies. 90 tablet 2   ciprofloxacin (CIPRO) 500 MG tablet Take 500 mg by mouth 2 (two) times daily.     citalopram (CELEXA) 40 MG tablet Take 1 tablet (40 mg total) by mouth daily. For anxiety and depression. 90 tablet 3   Continuous Blood Gluc Sensor (FREESTYLE LIBRE 3 SENSOR) MISC 1 Units by Does not apply route 4 (four) times daily. Place 1 sensor on the skin every 14 days. Use to check glucose continuously 6 each 3  cyclobenzaprine (FLEXERIL) 10 MG tablet Take 10 mg by mouth 2 (two) times daily as needed.     doxycycline (VIBRA-TABS) 100 MG tablet Take 1 tablet (100 mg total) by mouth 2 (two) times daily. 14 tablet 0   empagliflozin (JARDIANCE) 10 MG TABS tablet Take 1 tablet (10 mg total) by mouth daily. For diabetes. 90 tablet 1   fluticasone (FLONASE) 50 MCG/ACT nasal spray Place 2 sprays into both nostrils daily as needed for allergies or rhinitis. 48 g 3   fluticasone furoate-vilanterol (BREO ELLIPTA) 200-25 MCG/INH AEPB Inhale 1 puff into the lungs daily. 90 each 3   gabapentin (NEURONTIN) 300 MG capsule Take 2 capsules (600 mg total) by mouth 2 (two) times daily. For pain. 360 capsule 2   glipiZIDE (GLUCOTROL) 10 MG tablet Take 1 tablet (10 mg total) by mouth 2 (two) times daily before a meal. For diabetes. 180 tablet 1   insulin degludec (TRESIBA) 100 UNIT/ML FlexTouch Pen Inject 35 Units into the skin daily. For diabetes. 30 mL 1   Insulin Pen Needle (UNIFINE PENTIPS) 31G X 6 MM MISC USE NIGHTLY WITH INSULIN 100 each 3   Ipratropium-Albuterol (COMBIVENT RESPIMAT) 20-100 MCG/ACT AERS respimat INHALE ONE PUFF INTO THE LUNGS EVERY FOUR HOURS AS NEEDED FOR WHEEZING 4 g 0   Lancets (ONETOUCH DELICA PLUS  [id number]) MISC USE TO CHECK BLOOD SUGAR UP TO FOUR TIMES DAILY 100 each 5   lansoprazole (PREVACID) 30 MG capsule Take 1 capsule (30 mg total) by mouth 2 (two) times daily before a meal. For heartburn. 180 capsule 3   metFORMIN (GLUCOPHAGE-XR) 500 MG 24 hr tablet Take 2 tablets (1,000 mg total) by mouth 2 (two) times daily with a meal. For diabetes. 360 tablet 0   MOVANTIK 25 MG TABS tablet Take 25 mg by mouth every morning.     NUCYNTA 75 MG tablet Take 75 mg by mouth 3 (three) times daily as needed.     ONETOUCH VERIO test strip USE TO CHECK BLOOD SUGAR UP TO FOUR TIMES DAILY 100 strip 5   oxyCODONE (OXY IR/ROXICODONE) 5 MG immediate release tablet Take 5 mg by mouth every 6 (six) hours as needed.     rivaroxaban (XARELTO) 20 MG TABS tablet Take 1 tablet (20 mg total) by mouth daily with supper. 90 tablet 0   tamsulosin (FLOMAX) 0.4 MG CAPS capsule TAKE ONE CAPSULE BY MOUTH DAILY 90 capsule 1   Turmeric 500 MG CAPS Take 1 capsule by mouth daily.     zolpidem (AMBIEN) 10 MG tablet TAKE ONE TABLET (10MG TOTAL) BY MOUTH ATBEDTIME AS NEEDED FOR SLEEP 90 tablet 0   aspirin EC 81 MG tablet Take 1 tablet (81 mg total) by mouth 2 (two) times daily for 28 days. Swallow whole. (Patient not taking: Reported on 01/22/2022) 56 tablet 0   No current facility-administered medications for this visit.    PHYSICAL EXAM Vitals:   01/22/22 1031  BP: 115/77  Pulse: (!) 104  Resp: 20  Temp: 98 F (36.7 C)  SpO2: 96%  Weight: 233 lb 12.8 oz (106.1 kg)  Height: _0  (1.778 m)    Constitutional: well appearing. no distress. Appears well nourished.  Neurologic: CN intact. no focal findings. no sensory loss. Psychiatric:  Mood and affect symmetric and appropriate. Eyes:  No icterus. No conjunctival pallor. Ears, nose, throat:  mucous membranes moist. Midline trachea.  Cardiac: regular rate and rhythm.  Respiratory:  unlabored. Abdominal:  soft, non-tender, non-distended.  Peripheral vascular: 2+  DP  pulses Extremity: 1-2+ edema RLE. no cyanosis. no pallor. Well healing arthoscopic incisions. Skin: no gangrene. no ulceration.  Lymphatic: no Stemmer's sign. no palpable lymphadenopathy.  PERTINENT LABORATORY AND RADIOLOGIC DATA  Most recent CBC CBC Latest Ref Rng & Units 01/11/2022 01/02/2022 10/23/2021  WBC 3.8 - 10.8 Thousand/uL 12.9(H) - 10.5  Hemoglobin 13.2 - 17.1 g/dL 14.3 15.6 15.7  Hematocrit 38.5 - 50.0 % 43.2 46.0 46.1  Platelets 140 - 400 Thousand/uL 387 - 238     Most recent CMP CMP Latest Ref Rng & Units 01/11/2022 01/02/2022 10/23/2021  Glucose 65 - 99 mg/dL 398(H) 171(H) 187(H)  BUN 7 - 25 mg/dL _0 Creatinine 0.70 - 1.30 mg/dL 0.93 0.70 1.01  Sodium 135 - 146 mmol/L 132(L) 133(L) 138  Potassium 3.5 - 5.3 mmol/L 4.5 3.5 4.3  Chloride 98 - 110 mmol/L 96(L) 95(L) 98  CO2 20 - 32 mmol/L 23 - 26  Calcium 8.6 - 10.3 mg/dL 8.9 - 9.6  Total Protein 6.0 - 8.5 g/dL - - 7.0  Total Bilirubin 0.0 - 1.2 mg/dL - - 0.6  Alkaline Phos 44 - 121 IU/L - - 113  AST 0 - 40 IU/L - - 23  ALT 0 - 44 IU/L - - 38    Renal function Estimated Creatinine Clearance: 114.6 mL/min (by C-G formula based on SCr of 0.93 mg/dL).  Hemoglobin A1C (%)  Date Value  10/23/2021 8.9 (A)   Hgb A1c MFr Bld (%)  Date Value  08/23/2019 10.8 (H)    LDL Chol Calc (NIH)  Date Value Ref Range Status  10/23/2021 58 0 - 99 mg/dL Final   Direct LDL  Date Value Ref Range Status  05/03/2020 78.0 mg/dL Final    Comment:    Optimal:  <100 mg/dLNear or Above Optimal:  100-129 mg/dLBorderline High:  130-159 mg/dLHigh:  160-189 mg/dLVery High:  >190 mg/dL     Venous Duplex 01/04/22 RIGHT:  - Findings consistent with acute deep vein thrombosis involving the right  gastrocnemius veins.  - Findings consistent with chronic superficial vein thrombosis involving  the right small saphenous vein.  - No cystic structure found in the popliteal fossa.  - Acute occlusive thrombus in the gastrocnemius veins, with  dilatation,  measuring .97 cm.     LEFT:  - No evidence of common femoral vein obstruction.      Yevonne Aline. Stanford Breed, MD [facility] Phone Number: [phone] 01/22/2022 3:20 PM  Total time spent on preparing this encounter including chart review, data review, collecting history, examining the patient, coordinating care for this new patient, 60 minutes.  Portions of this report may have been transcribed using voice recognition software.  Every effort has been made to ensure accuracy; however, inadvertent computerized transcription errors may still be present.

## 2022-01-22 ENCOUNTER — Other Ambulatory Visit: Payer: Self-pay

## 2022-01-22 ENCOUNTER — Other Ambulatory Visit: Payer: Self-pay | Admitting: Primary Care

## 2022-01-22 ENCOUNTER — Telehealth: Payer: Self-pay

## 2022-01-22 ENCOUNTER — Ambulatory Visit (INDEPENDENT_AMBULATORY_CARE_PROVIDER_SITE_OTHER): Payer: Medicare Other | Admitting: Vascular Surgery

## 2022-01-22 ENCOUNTER — Encounter: Payer: Self-pay | Admitting: Vascular Surgery

## 2022-01-22 VITALS — BP 115/77 | HR 104 | Temp 98.0°F | Resp 20 | Ht 70.0 in | Wt 233.8 lb

## 2022-01-22 DIAGNOSIS — G4700 Insomnia, unspecified: Secondary | ICD-10-CM

## 2022-01-22 DIAGNOSIS — I824Z1 Acute embolism and thrombosis of unspecified deep veins of right distal lower extremity: Secondary | ICD-10-CM

## 2022-01-22 DIAGNOSIS — M25562 Pain in left knee: Secondary | ICD-10-CM

## 2022-01-22 DIAGNOSIS — G8929 Other chronic pain: Secondary | ICD-10-CM

## 2022-01-22 MED ORDER — RIVAROXABAN 20 MG PO TABS: 20.0000 mg | ORAL_TABLET | Freq: Every day | ORAL | 0 refills | Status: DC

## 2022-01-22 NOTE — Telephone Encounter (Signed)
Patient called back wanting to know if he should still be taking oral antibiotics along with the IV. Per Dr. Lucianne Lei Dam's note and MyChart message, patient should have stopped the cipro and continue bactrim until PICC was placed. Relayed to patient he should only be on IV ceftriaxone at this point, no orals. Patient verbalized understanding and has no further questions.   Beryle Flock, RN

## 2022-01-22 NOTE — Telephone Encounter (Signed)
Patient called office and reports that the PICC site was evaluated by his vascular surgeon who reports that the bleeding is "nothing to worry about" per the patient. Asked him to please let us know the the bleeding worsens. Patient verbalized understanding and has no further questions.   Beryle Flock, RN

## 2022-01-22 NOTE — Telephone Encounter (Signed)
Patient reports he did not go to the ED to have his PICC line evaluated last Friday 2/17. He says the insertion site is still painful and feels like a "weak bee sting."  Denies swelling of the arm, he is still having bleeding at the site, line was placed 2/14. Patient has an appointment with cardiology this morning, he is going to ask his cardiologist to take a look at the PICC line and he will call our office back after this appointment to discuss further.   Beryle Flock, RN

## 2022-01-23 ENCOUNTER — Ambulatory Visit: Payer: Medicare Other | Admitting: Primary Care

## 2022-01-23 DIAGNOSIS — M25562 Pain in left knee: Secondary | ICD-10-CM | POA: Diagnosis not present

## 2022-01-23 DIAGNOSIS — M452 Ankylosing spondylitis of cervical region: Secondary | ICD-10-CM | POA: Diagnosis not present

## 2022-01-23 DIAGNOSIS — M5412 Radiculopathy, cervical region: Secondary | ICD-10-CM | POA: Diagnosis not present

## 2022-01-23 DIAGNOSIS — Z79891 Long term (current) use of opiate analgesic: Secondary | ICD-10-CM | POA: Diagnosis not present

## 2022-01-23 DIAGNOSIS — M545 Low back pain, unspecified: Secondary | ICD-10-CM | POA: Diagnosis not present

## 2022-01-23 DIAGNOSIS — M79642 Pain in left hand: Secondary | ICD-10-CM | POA: Diagnosis not present

## 2022-01-23 DIAGNOSIS — K5903 Drug induced constipation: Secondary | ICD-10-CM | POA: Diagnosis not present

## 2022-01-23 DIAGNOSIS — M542 Cervicalgia: Secondary | ICD-10-CM | POA: Diagnosis not present

## 2022-01-23 DIAGNOSIS — M25561 Pain in right knee: Secondary | ICD-10-CM | POA: Diagnosis not present

## 2022-01-24 ENCOUNTER — Telehealth: Payer: Self-pay

## 2022-01-24 NOTE — Telephone Encounter (Signed)
Patient called complaining of redness and swelling with heat in knee where he had infection. Offered a 2 pm appt today and he declined due to transportation. He states he is going to see Orthopedic Surgeon 10:15 AM tomorrow and I did advise him to keep that appointment or seek medical attention from UC/ED if worsens or fever is an issue.

## 2022-01-24 NOTE — Progress Notes (Deleted)
Patient Active Problem List   Diagnosis Date Noted   Septic arthritis of knee, right ([place]) 01/09/2022   DVT (deep venous thrombosis) (HCC) 01/09/2022   Preoperative clearance 11/20/2021   Post-viral cough syndrome 11/20/2021   COVID-19 10/31/2021   Cervical lymphadenitis 11/27/2020   Preventative health care 07/05/2020   Leukocytosis 05/04/2020   Chronic fatigue 05/03/2020   Medicare annual wellness visit, subsequent 06/28/2019   Controlled substance agreement broken 09/14/2018   Primary osteoarthritis of knee 07/21/2018   Status post unicompartmental knee replacement, left 07/21/2018   Medicare annual wellness visit, initial 05/20/2018   Effusion of knee joint (Left) 04/21/2018   Opiate overdose ([place]) 03/24/2018   Arthropathy of knee (Left) 01/12/2018   Opioid-induced constipation ([facility]) 01/12/2018   Tinea corporis 01/07/2018   Tinea versicolor 01/07/2018   Pharmacologic therapy 12/08/2017   Problems influencing health status 12/08/2017   Long term prescription opiate use 12/08/2017   Opiate use 12/08/2017   Insomnia 11/20/2017   Disorder of skeletal system 11/05/2017   Chronic knee pain (Primary Area of Pain) (Bilateral) (L>R) 11/05/2017   Chronic wrist pain (Secondary Area of Pain) (Left) 11/05/2017   Chronic constipation 08/18/2017   Hyperlipidemia 05/12/2017   COPD without exacerbation ([place]) 05/07/2017   Male hypogonadism 04/24/2017   Post-void dribbling 04/01/2017   Osteoarthritis of the knee (Left)    GERD (gastroesophageal reflux disease) 03/12/2017   Bucket handle tear of meniscus of knee (Right)    Anxiety and depression 10/07/2015   Morbid obesity ([place]) 10/06/2015   OSA (obstructive sleep apnea) 08/31/2014   Testosterone deficiency 07/01/2014   S/P knee surgery 04/26/2014   Meniscal tear of knee, sequela (Left) 04/26/2014   Medial meniscus, posterior horn derangement 04/22/2014   Intrinsic asthma 12/19/2013   Chronic pain syndrome 05/13/2013   Type 2  diabetes mellitus with hyperglycemia ([place]) 05/13/2013   Mediastinal abnormality 04/10/2012   Cigarette smoker 04/10/2012    Patient's Medications  New Prescriptions   No medications on file  Previous Medications   ALBUTEROL (PROVENTIL) (2.5 MG/3ML) 0.083% NEBULIZER SOLUTION    USE 3 ML(1 VIAL) VIA NEBULIZER FOUR TIMES DAILY AS NEEDED FOR SHORTNESS OF BREATH   ANDROGEL PUMP 20.25 MG/ACT (1.62%) GEL    Apply 2 Pump topically daily.    ASPIRIN EC 81 MG TABLET    Take 1 tablet (81 mg total) by mouth 2 (two) times daily for 28 days. Swallow whole.   ATORVASTATIN (LIPITOR) 40 MG TABLET    TAKE ONE TABLET (40MG TOTAL) BY MOUTH DAILY FOR CHOLESTEROL   BENZONATATE (TESSALON) 200 MG CAPSULE    Take 1 capsule (200 mg total) by mouth 3 (three) times daily as needed for cough.   BLOOD GLUCOSE METER KIT AND SUPPLIES KIT    Dispense based on patient and insurance preference. Use up to four times daily as directed. (FOR ICD-9 250.00, 250.01).   BUPROPION (WELLBUTRIN SR) 150 MG 12 HR TABLET    Take 1 tablet (150 mg total) by mouth 2 (two) times daily. For anxiety and depression.   CEFTRIAXONE (ROCEPHIN) 10 G INJECTION       CETIRIZINE (ZYRTEC) 10 MG TABLET    Take 1 tablet by mouth at bedtime for allergies.   CIPROFLOXACIN (CIPRO) 500 MG TABLET    Take 500 mg by mouth 2 (two) times daily.   CITALOPRAM (CELEXA) 40 MG TABLET    Take 1 tablet (40 mg total) by mouth daily. For anxiety and depression.   CONTINUOUS  BLOOD GLUC SENSOR (FREESTYLE LIBRE 3 SENSOR) MISC    1 Units by Does not apply route 4 (four) times daily. Place 1 sensor on the skin every 14 days. Use to check glucose continuously   CYCLOBENZAPRINE (FLEXERIL) 10 MG TABLET    Take 10 mg by mouth 2 (two) times daily as needed.   DOXYCYCLINE (VIBRA-TABS) 100 MG TABLET    Take 1 tablet (100 mg total) by mouth 2 (two) times daily.   EMPAGLIFLOZIN (JARDIANCE) 10 MG TABS TABLET    Take 1 tablet (10 mg total) by mouth daily. For diabetes.   FLUTICASONE  (FLONASE) 50 MCG/ACT NASAL SPRAY    Place 2 sprays into both nostrils daily as needed for allergies or rhinitis.   FLUTICASONE FUROATE-VILANTEROL (BREO ELLIPTA) 200-25 MCG/INH AEPB    Inhale 1 puff into the lungs daily.   GABAPENTIN (NEURONTIN) 300 MG CAPSULE    Take 2 capsules (600 mg total) by mouth 2 (two) times daily. For pain.   GLIPIZIDE (GLUCOTROL) 10 MG TABLET    Take 1 tablet (10 mg total) by mouth 2 (two) times daily before a meal. For diabetes.   INSULIN DEGLUDEC (TRESIBA) 100 UNIT/ML FLEXTOUCH PEN    Inject 35 Units into the skin daily. For diabetes.   INSULIN PEN NEEDLE (UNIFINE PENTIPS) 31G X 6 MM MISC    USE NIGHTLY WITH INSULIN   IPRATROPIUM-ALBUTEROL (COMBIVENT RESPIMAT) 20-100 MCG/ACT AERS RESPIMAT    INHALE ONE PUFF INTO THE LUNGS EVERY FOUR HOURS AS NEEDED FOR WHEEZING   LANCETS (ONETOUCH DELICA PLUS [id number]) MISC    USE TO CHECK BLOOD SUGAR UP TO FOUR TIMES DAILY   LANSOPRAZOLE (PREVACID) 30 MG CAPSULE    Take 1 capsule (30 mg total) by mouth 2 (two) times daily before a meal. For heartburn.   METFORMIN (GLUCOPHAGE-XR) 500 MG 24 HR TABLET    Take 2 tablets (1,000 mg total) by mouth 2 (two) times daily with a meal. For diabetes.   MOVANTIK 25 MG TABS TABLET    Take 25 mg by mouth every morning.   NUCYNTA 75 MG TABLET    Take 75 mg by mouth 3 (three) times daily as needed.   ONETOUCH VERIO TEST STRIP    USE TO CHECK BLOOD SUGAR UP TO FOUR TIMES DAILY   OXYCODONE (OXY IR/ROXICODONE) 5 MG IMMEDIATE RELEASE TABLET    Take 5 mg by mouth every 6 (six) hours as needed.   RIVAROXABAN (XARELTO) 20 MG TABS TABLET    Take 1 tablet (20 mg total) by mouth daily with supper.   TAMSULOSIN (FLOMAX) 0.4 MG CAPS CAPSULE    TAKE ONE CAPSULE BY MOUTH DAILY   TURMERIC 500 MG CAPS    Take 1 capsule by mouth daily.   ZOLPIDEM (AMBIEN) 10 MG TABLET    TAKE ONE TABLET (10MG TOTAL) BY MOUTH ATBEDTIME AS NEEDED FOR SLEEP  Modified Medications   No medications on file  Discontinued Medications   No  medications on file    Subjective: 51 Y O Male with PMH of Anxiety, arthritis, asthma, , COPD, DM2, DVT, GERD, left knee partial arthroplasty in 07/2018, Left wrist fracture ORIF 12/2016, left knee medical meniscectomy 04/2014 and rt knee medial meniscectomy 12/2015  Rt knee arthroscopic I and D on 01/02/22 Review of Systems: ROS  Past Medical History:  Diagnosis Date   Acute encephalopathy 03/24/2018   Anxiety    Arthritis    oa left knee and lower back   Asthma  Cataract    hx of bilateral   Chronic back pain    Chronic pain of left knee    COPD (chronic obstructive pulmonary disease) (HCC)    Diabetes mellitus    type 2   DVT (deep venous thrombosis) ([place]) 01/09/2022   Encephalopathy acute 03/24/2018   GERD (gastroesophageal reflux disease)    INGUINAL PAIN, LEFT 10/03/2009   Qualifier: Diagnosis of  By: Oneida Alar MD, Sandi L    Oral thrush 11/10/2018   Overdose    03-24-18 accidental   Rash and nonspecific skin eruption 03/12/2017   Septic arthritis ([place]) 01/09/2022   Shortness of breath    with heavy exertion   Sleep apnea    Past Surgical History:  Procedure Laterality Date   CARPAL TUNNEL RELEASE Left    CATARACT EXTRACTION W/PHACO Right 03/11/2016   Procedure: CATARACT EXTRACTION PHACO AND INTRAOCULAR LENS PLACEMENT ([place]);  Surgeon: Tonny Branch, MD;  Location: [facility];  Service: Ophthalmology;  Laterality: Right;  CDE 4.24   EYE SURGERY Right 2018   ioc with lens replacement   HERNIA REPAIR Bilateral 7062   umbilical   INCISION AND DRAINAGE ABSCESS Right 01/02/2022   Procedure: RIGHT KNEE ARTHROSCOPIC IRRIGATION AND DEBRIDEMENT;  Surgeon: Willaim Sheng, MD;  Location: [facility];  Service: Orthopedics;  Laterality: Right;   KNEE ARTHROSCOPY WITH MEDIAL MENISECTOMY Left 04/22/2014   Procedure: LEFT KNEE ARTHROSCOPY WITH MEDIAL MENISECTOMY;  Surgeon: Carole Civil, MD;  Location: [facility];  Service: Orthopedics;  Laterality: Left;   KNEE ARTHROSCOPY WITH  MEDIAL MENISECTOMY Right 12/06/2015   Procedure: RIGHT KNEE ARTHROSCOPY WITH MEDIAL MENISECTOMY;  Surgeon: Carole Civil, MD;  Location: [facility];  Service: Orthopedics;  Laterality: Right;   KNEE ARTHROSCOPY WITH MEDIAL MENISECTOMY Left 03/20/2017   Procedure: KNEE ARTHROSCOPY WITH MEDIAL MENISECTOMY;  Surgeon: Carole Civil, MD;  Location: [facility];  Service: Orthopedics;  Laterality: Left;   ORIF WRIST FRACTURE Left 12/19/2016   Procedure: OPEN REDUCTION INTERNAL FIXATION (ORIF) WRIST FRACTURE;  Surgeon: Leanora Cover, MD;  Location: [facility];  Service: Orthopedics;  Laterality: Left;  accumed    PARTIAL KNEE ARTHROPLASTY Left 07/21/2018   Procedure: LEFT UNICOMPARTMENTAL KNEE;  Surgeon: Renette Butters, MD;  Location: [facility];  Service: Orthopedics;  Laterality: Left;   SHOULDER ARTHROCENTESIS Right 2008 or 2010   VASECTOMY  2014    Social History   Tobacco Use   Smoking status: Every Day    Packs/day: 1.25    Years: 26.00    Pack years: 32.50    Types: Cigarettes    Start date: 12/17/1993   Smokeless tobacco: Never   Tobacco comments:    1 ppd 01/04/2020  Vaping Use   Vaping Use: Former  Substance Use Topics   Alcohol use: Never    Alcohol/week: 0.0 standard drinks   Drug use: Never    Family History  Problem Relation Age of Onset   Emphysema Maternal Grandfather    Emphysema Maternal Grandmother    Clotting disorder Maternal Grandmother     Allergies  Allergen Reactions   Azithromycin Hives   Clarithromycin    Erythromycin Hives   Keflex [Cephalexin] Nausea Only   Metformin Nausea And Vomiting   Symbicort [Budesonide-Formoterol Fumarate] Other (See Comments)    States that 3 doses were used and breathing became worse    Health Maintenance  Topic Date Due   COVID-19 Vaccine (1) Never done   Hepatitis C Screening  Never  done   COLONOSCOPY (Pts 45-37yr Insurance coverage will need to be confirmed)  Never done   OPHTHALMOLOGY EXAM  11/03/2018    HEMOGLOBIN A1C  04/22/2022   FOOT EXAM  10/23/2022   URINE MICROALBUMIN  10/23/2022   TETANUS/TDAP  10/22/2027   INFLUENZA VACCINE  Completed   HIV Screening  Completed   Zoster Vaccines- Shingrix  Completed   HPV VACCINES  Aged Out    Objective:  There were no vitals filed for this visit. There is no height or weight on file to calculate BMI.  Physical Exam Constitutional:      Appearance: Normal appearance.  HENT:     Head: Normocephalic and atraumatic.      Mouth: Mucous membranes are moist.  Eyes:    Conjunctiva/sclera: Conjunctivae normal.     Pupils: Pupils are equal, round, and reactive to light.   Cardiovascular:     Rate and Rhythm: Normal rate and regular rhythm.     Heart sounds: No murmur heard. No friction rub. No gallop.   Pulmonary:     Effort: Pulmonary effort is normal.     Breath sounds: Normal breath sounds.   Abdominal:     General: Non distended     Palpations: soft.   Musculoskeletal:        General: Normal range of motion.   Skin:    General: Skin is warm and dry.     Comments:  Neurological:     General: grossly non focal     Mental Status: awake, alert and oriented to person, place, and time.   Psychiatric:        Mood and Affect: Mood normal.   Lab Results Lab Results  Component Value Date   WBC 12.9 (H) 01/11/2022   HGB 14.3 01/11/2022   HCT 43.2 01/11/2022   MCV 88.0 01/11/2022   PLT 387 01/11/2022    Lab Results  Component Value Date   CREATININE 0.93 01/11/2022   BUN 11 01/11/2022   NA 132 (L) 01/11/2022   K 4.5 01/11/2022   CL 96 (L) 01/11/2022   CO2 23 01/11/2022    Lab Results  Component Value Date   ALT 38 10/23/2021   AST 23 10/23/2021   ALKPHOS 113 10/23/2021   BILITOT 0.6 10/23/2021    Lab Results  Component Value Date   CHOL 135 10/23/2021   HDL 34 (L) 10/23/2021   LDLCALC 58 10/23/2021   LDLDIRECT 78.0 05/03/2020   TRIG 273 (H) 10/23/2021   CHOLHDL 4.0 10/23/2021   No results found for: LABRPR,  RPRTITER No results found for: HIV1RNAQUANT, HIV1RNAVL, CD4TABS   Microbiology  Results for orders placed or performed during the hospital encounter of 01/02/22  Aerobic/Anaerobic Culture w Gram Stain (surgical/deep wound)     Status: None   Collection Time: 01/02/22  1:29 PM   Specimen: PATH Cytology Misc. fluid; Body Fluid  Result Value Ref Range Status   Specimen Description   Final    FLUID MISC FLUID Performed at [id number] [place] [address]   Special Requests   Final    NONE [facility]formed at [facility] [address] [address] [place] [id number]   Gram Stain   Final    MODERATE WBC PRESENT,BOTH PMN AND MONONUCLEAR NO ORGANISMS SEEN    Culture   Final    RARE SERRATIA MARCESCENS NO ANAEROBES ISOLATED Performed at [facility] [address]  [place],  [address]    Report Status 01/08/2022 FINAL  Final   Organism ID, Bacteria SERRATIA MARCESCENS  Final      Susceptibility   Serratia marcescens - MIC*    CEFAZOLIN >=64 RESISTANT Resistant     CEFEPIME <=0.12 SENSITIVE Sensitive     CEFTAZIDIME <=1 SENSITIVE Sensitive     CEFTRIAXONE <=0.25 SENSITIVE Sensitive     CIPROFLOXACIN <=0.25 SENSITIVE Sensitive     GENTAMICIN <=1 SENSITIVE Sensitive     TRIMETH/SULFA <=20 SENSITIVE Sensitive     * RARE SERRATIA MARCESCENS   Imaging (personally reviewed ) VAS [place] LOWER EXTREMITY VENOUS (DVT)  Result Date: 01/04/2022  Lower Venous DVT Study Patient Name:  Joseph [name]  Date of Exam:   01/04/2022 Medical Rec #: [id number]      Accession #:    [id number] Date of Birth: [birth year]      Patient Gender: M Patient Age:   9 years Exam Location:  [facility] Procedure:      VAS [place] LOWER EXTREMITY VENOUS (DVT) Referring Phys: Christia Reading MURPHY --------------------------------------------------------------------------------  Indications: Pain in the right lower extremity x 6 days, s/p right meniscus repair and infection.  Patient denies any unusal SOB.  Risk Factors: Surgery to the right knee; meniscus repair on 12/20/2021. Right knee arthroscopic irrigation and debridement on 01/02/2022. Trauma to the knee in October 2022. Comparison Study: NA Performing Technologist: Sharlett Iles RVT  Examination Guidelines: A complete evaluation includes B-mode imaging, spectral Doppler, color Doppler, and power Doppler as needed of all accessible portions of each vessel. Bilateral testing is considered an integral part of a complete examination. Limited examinations for reoccurring indications may be performed as noted. The reflux portion of the exam is performed with the patient in reverse Trendelenburg.  +---------+---------------+---------+-----------+---------------+--------------+  RIGHT     Compressibility Phasicity Spontaneity Properties      Thrombus Aging  +---------+---------------+---------+-----------+---------------+--------------+  CFV       Full            Yes       Yes                                         +---------+---------------+---------+-----------+---------------+--------------+  SFJ       Full            Yes       Yes                                         +---------+---------------+---------+-----------+---------------+--------------+  FV Prox   Full            Yes       Yes                                         +---------+---------------+---------+-----------+---------------+--------------+  FV Mid    Full                                                                  +---------+---------------+---------+-----------+---------------+--------------+  FV Distal Full            Yes       Yes                                         +---------+---------------+---------+-----------+---------------+--------------+  PFV       Full            Yes       Yes                                         +---------+---------------+---------+-----------+---------------+--------------+  POP       Full            Yes       Yes                                          +---------+---------------+---------+-----------+---------------+--------------+  PTV       Full                      No                                          +---------+---------------+---------+-----------+---------------+--------------+  PERO      Full                      No                                          +---------+---------------+---------+-----------+---------------+--------------+  Gastroc   None            No        No          softly          Acute                                                            echogenic and                                                                    dilated at .97                                                                   cm                              +---------+---------------+---------+-----------+---------------+--------------+  GSV       Full            Yes       Yes                                         +---------+---------------+---------+-----------+---------------+--------------+  SSV       Partial                   No          brightly        Chronic                                                          echogenic                       +---------+---------------+---------+-----------+---------------+--------------+  Right Technical Findings: Acute occlusive thrombus in the gastrocnemius veins, with dilatation, measuring .97 cm. Chronic non occlusive SVT in the proximal small saphenous vein. There is moderate interstitial fluid throughout the mid/distal posterior calf.  +----+---------------+---------+-----------+----------+--------------+  LEFT Compressibility Phasicity Spontaneity Properties Thrombus Aging  +----+---------------+---------+-----------+----------+--------------+  CFV  Full            Yes       Yes                                    +----+---------------+---------+-----------+----------+--------------+   Findings reported to [facility] at 3:40 pm.  Summary: RIGHT: - Findings consistent with  acute deep vein thrombosis involving the right gastrocnemius veins. - Findings consistent with chronic superficial vein thrombosis involving the right small saphenous vein. - No cystic structure found in the popliteal fossa. - Acute occlusive thrombus in the gastrocnemius veins, with dilatation, measuring .97 cm.  LEFT: - No evidence of common femoral vein obstruction.  *See table(s) above for measurements and observations. Electronically signed by Kathlyn Sacramento MD on 01/04/2022 at 5:59:02 PM.    Final    IR PICC PLACEMENT RIGHT >5 YRS INC IMG GUIDE  Result Date: 01/15/2022 Pasty Spillers, [place]     01/15/2022 10:23 AM Successful placement of single lumen PICC line to right basilic vein. Length 41cm Tip at lower SVC/RA PICC capped No complications Ready for use. EBL < 5 mL Hayley Boisseau, PA-C 01/15/2022 10:22 AM      Problem List Items Addressed This Visit   None   I have personally spent more than 70 minutes involved in face-to-face and non-face-to-face activities for this patient on the day of the visit. Professional time spent includes the following activities: Preparing to see the patient (review of tests), Obtaining and/or reviewing separately obtained history (admission/discharge record), Performing a medically appropriate examination and/or evaluation , Ordering medications/tests/procedures, referring and communicating with other health care professionals, Documenting clinical information in the EMR, Independently interpreting results (not separately reported), Communicating results to the patient/family/caregiver, Counseling and educating the patient/family/caregiver and Care coordination (not separately reported).   Wilber Oliphant, [place] [facility] 01/24/2022, 10:41 AM

## 2022-01-25 ENCOUNTER — Encounter (HOSPITAL_BASED_OUTPATIENT_CLINIC_OR_DEPARTMENT_OTHER): Payer: Self-pay | Admitting: *Deleted

## 2022-01-25 ENCOUNTER — Inpatient Hospital Stay (HOSPITAL_COMMUNITY): Payer: Medicare Other

## 2022-01-25 ENCOUNTER — Inpatient Hospital Stay: Admit: 2022-01-25 | Payer: Medicare Other | Admitting: Orthopedic Surgery

## 2022-01-25 ENCOUNTER — Inpatient Hospital Stay (HOSPITAL_BASED_OUTPATIENT_CLINIC_OR_DEPARTMENT_OTHER)
Admission: EM | Admit: 2022-01-25 | Discharge: 2022-01-30 | DRG: 464 | Disposition: A | Discharge status: Home Health | Payer: Medicare Other | Attending: Internal Medicine | Admitting: Internal Medicine

## 2022-01-25 ENCOUNTER — Ambulatory Visit: Payer: Medicare Other | Admitting: Infectious Diseases

## 2022-01-25 ENCOUNTER — Other Ambulatory Visit: Payer: Self-pay

## 2022-01-25 DIAGNOSIS — L02419 Cutaneous abscess of limb, unspecified: Secondary | ICD-10-CM

## 2022-01-25 DIAGNOSIS — T402X5A Adverse effect of other opioids, initial encounter: Secondary | ICD-10-CM | POA: Diagnosis present

## 2022-01-25 DIAGNOSIS — G894 Chronic pain syndrome: Secondary | ICD-10-CM | POA: Diagnosis present

## 2022-01-25 DIAGNOSIS — G4733 Obstructive sleep apnea (adult) (pediatric): Secondary | ICD-10-CM | POA: Diagnosis not present

## 2022-01-25 DIAGNOSIS — Z7982 Long term (current) use of aspirin: Secondary | ICD-10-CM | POA: Diagnosis not present

## 2022-01-25 DIAGNOSIS — F32A Depression, unspecified: Secondary | ICD-10-CM | POA: Diagnosis present

## 2022-01-25 DIAGNOSIS — M009 Pyogenic arthritis, unspecified: Secondary | ICD-10-CM | POA: Diagnosis present

## 2022-01-25 DIAGNOSIS — Z86718 Personal history of other venous thrombosis and embolism: Secondary | ICD-10-CM

## 2022-01-25 DIAGNOSIS — L089 Local infection of the skin and subcutaneous tissue, unspecified: Secondary | ICD-10-CM

## 2022-01-25 DIAGNOSIS — L02415 Cutaneous abscess of right lower limb: Secondary | ICD-10-CM | POA: Diagnosis present

## 2022-01-25 DIAGNOSIS — M25561 Pain in right knee: Secondary | ICD-10-CM | POA: Diagnosis not present

## 2022-01-25 DIAGNOSIS — B9689 Other specified bacterial agents as the cause of diseases classified elsewhere: Secondary | ICD-10-CM | POA: Diagnosis present

## 2022-01-25 DIAGNOSIS — E119 Type 2 diabetes mellitus without complications: Secondary | ICD-10-CM | POA: Diagnosis not present

## 2022-01-25 DIAGNOSIS — A498 Other bacterial infections of unspecified site: Secondary | ICD-10-CM | POA: Diagnosis not present

## 2022-01-25 DIAGNOSIS — S83511A Sprain of anterior cruciate ligament of right knee, initial encounter: Secondary | ICD-10-CM | POA: Diagnosis not present

## 2022-01-25 DIAGNOSIS — M25461 Effusion, right knee: Secondary | ICD-10-CM | POA: Diagnosis not present

## 2022-01-25 DIAGNOSIS — Z6833 Body mass index (BMI) 33.0-33.9, adult: Secondary | ICD-10-CM | POA: Diagnosis not present

## 2022-01-25 DIAGNOSIS — Z716 Tobacco abuse counseling: Secondary | ICD-10-CM

## 2022-01-25 DIAGNOSIS — E6609 Other obesity due to excess calories: Secondary | ICD-10-CM

## 2022-01-25 DIAGNOSIS — I5032 Chronic diastolic (congestive) heart failure: Secondary | ICD-10-CM | POA: Diagnosis present

## 2022-01-25 DIAGNOSIS — F17210 Nicotine dependence, cigarettes, uncomplicated: Secondary | ICD-10-CM | POA: Diagnosis present

## 2022-01-25 DIAGNOSIS — Z7984 Long term (current) use of oral hypoglycemic drugs: Secondary | ICD-10-CM | POA: Diagnosis not present

## 2022-01-25 DIAGNOSIS — Z20822 Contact with and (suspected) exposure to covid-19: Secondary | ICD-10-CM | POA: Diagnosis present

## 2022-01-25 DIAGNOSIS — M2241 Chondromalacia patellae, right knee: Secondary | ICD-10-CM | POA: Diagnosis not present

## 2022-01-25 DIAGNOSIS — K5903 Drug induced constipation: Secondary | ICD-10-CM | POA: Diagnosis not present

## 2022-01-25 DIAGNOSIS — I82409 Acute embolism and thrombosis of unspecified deep veins of unspecified lower extremity: Secondary | ICD-10-CM | POA: Diagnosis present

## 2022-01-25 DIAGNOSIS — Z794 Long term (current) use of insulin: Secondary | ICD-10-CM

## 2022-01-25 DIAGNOSIS — I824Z1 Acute embolism and thrombosis of unspecified deep veins of right distal lower extremity: Secondary | ICD-10-CM | POA: Diagnosis not present

## 2022-01-25 DIAGNOSIS — Z881 Allergy status to other antibiotic agents status: Secondary | ICD-10-CM

## 2022-01-25 DIAGNOSIS — T8140XD Infection following a procedure, unspecified, subsequent encounter: Secondary | ICD-10-CM | POA: Diagnosis not present

## 2022-01-25 DIAGNOSIS — F419 Anxiety disorder, unspecified: Secondary | ICD-10-CM | POA: Diagnosis not present

## 2022-01-25 DIAGNOSIS — E1165 Type 2 diabetes mellitus with hyperglycemia: Secondary | ICD-10-CM | POA: Diagnosis present

## 2022-01-25 DIAGNOSIS — T8140XA Infection following a procedure, unspecified, initial encounter: Secondary | ICD-10-CM | POA: Diagnosis not present

## 2022-01-25 DIAGNOSIS — Z1619 Resistance to other specified beta lactam antibiotics: Secondary | ICD-10-CM | POA: Diagnosis not present

## 2022-01-25 DIAGNOSIS — Z7901 Long term (current) use of anticoagulants: Secondary | ICD-10-CM

## 2022-01-25 DIAGNOSIS — Z79890 Hormone replacement therapy (postmenopausal): Secondary | ICD-10-CM

## 2022-01-25 DIAGNOSIS — K219 Gastro-esophageal reflux disease without esophagitis: Secondary | ICD-10-CM | POA: Diagnosis present

## 2022-01-25 DIAGNOSIS — Z888 Allergy status to other drugs, medicaments and biological substances status: Secondary | ICD-10-CM

## 2022-01-25 DIAGNOSIS — L02416 Cutaneous abscess of left lower limb: Secondary | ICD-10-CM | POA: Diagnosis not present

## 2022-01-25 DIAGNOSIS — M00861 Arthritis due to other bacteria, right knee: Secondary | ICD-10-CM | POA: Diagnosis not present

## 2022-01-25 DIAGNOSIS — M549 Dorsalgia, unspecified: Secondary | ICD-10-CM | POA: Diagnosis present

## 2022-01-25 DIAGNOSIS — E785 Hyperlipidemia, unspecified: Secondary | ICD-10-CM | POA: Diagnosis present

## 2022-01-25 DIAGNOSIS — T8453XA Infection and inflammatory reaction due to internal right knee prosthesis, initial encounter: Secondary | ICD-10-CM | POA: Diagnosis not present

## 2022-01-25 DIAGNOSIS — M00061 Staphylococcal arthritis, right knee: Secondary | ICD-10-CM | POA: Diagnosis not present

## 2022-01-25 DIAGNOSIS — J449 Chronic obstructive pulmonary disease, unspecified: Secondary | ICD-10-CM | POA: Diagnosis present

## 2022-01-25 DIAGNOSIS — Z7951 Long term (current) use of inhaled steroids: Secondary | ICD-10-CM

## 2022-01-25 DIAGNOSIS — Z79899 Other long term (current) drug therapy: Secondary | ICD-10-CM

## 2022-01-25 HISTORY — DX: Pyogenic arthritis, unspecified: M00.9

## 2022-01-25 HISTORY — DX: Heart failure, unspecified: I50.9

## 2022-01-25 LAB — BASIC METABOLIC PANEL
Anion gap: 13 (ref 5–15)
BUN: 7 mg/dL (ref 6–20)
CO2: 27 mmol/L (ref 22–32)
Calcium: 9.7 mg/dL (ref 8.9–10.3)
Chloride: 98 mmol/L (ref 98–111)
Creatinine, Ser: 0.69 mg/dL (ref 0.61–1.24)
GFR, Estimated: >60 mL/min (ref >60)
Glucose, Bld: 217 mg/dL — ABNORMAL HIGH (ref 70–99)
Potassium: 3.2 mmol/L — ABNORMAL LOW (ref 3.5–5.1)
Sodium: 138 mmol/L (ref 135–145)

## 2022-01-25 LAB — CBC WITH DIFFERENTIAL/PLATELET
Abs Immature Granulocytes: 0.04 10*3/uL (ref 0.00–0.07)
Basophils Absolute: 0.1 10*3/uL (ref 0.0–0.1)
Basophils Relative: 1 %
Eosinophils Absolute: 0.2 10*3/uL (ref 0.0–0.5)
Eosinophils Relative: 2 %
HCT: 39 % (ref 39.0–52.0)
Hemoglobin: 12.6 g/dL — ABNORMAL LOW (ref 13.0–17.0)
Immature Granulocytes: 0 %
Lymphocytes Relative: 23 %
Lymphs Abs: 2.5 10*3/uL (ref 0.7–4.0)
MCH: 27.8 pg (ref 26.0–34.0)
MCHC: 32.3 g/dL (ref 30.0–36.0)
MCV: 85.9 fL (ref 80.0–100.0)
Monocytes Absolute: 0.9 10*3/uL (ref 0.1–1.0)
Monocytes Relative: 8 %
Neutro Abs: 7.1 10*3/uL (ref 1.7–7.7)
Neutrophils Relative %: 66 %
Platelets: 350 10*3/uL (ref 150–400)
RBC: 4.54 MIL/uL (ref 4.22–5.81)
RDW: 14.1 % (ref 11.5–15.5)
WBC: 10.9 10*3/uL — ABNORMAL HIGH (ref 4.0–10.5)
nRBC: 0 % (ref 0.0–0.2)

## 2022-01-25 LAB — RESP PANEL BY RT-PCR (FLU A&B, COVID) ARPGX2
Influenza A by PCR: NEGATIVE
Influenza B by PCR: NEGATIVE
SARS Coronavirus 2 by RT PCR: NEGATIVE

## 2022-01-25 LAB — SEDIMENTATION RATE: Sed Rate: 92 mm/hr — ABNORMAL HIGH (ref 0–16)

## 2022-01-25 LAB — C-REACTIVE PROTEIN: CRP: 4.6 mg/dL — ABNORMAL HIGH (ref <1.0)

## 2022-01-25 LAB — GLUCOSE, CAPILLARY: Glucose-Capillary: 196 mg/dL — ABNORMAL HIGH (ref 70–99)

## 2022-01-25 MED ORDER — FLUTICASONE FUROATE-VILANTEROL 200-25 MCG/ACT IN AEPB
1.0000 | INHALATION_SPRAY | Freq: Every day | RESPIRATORY_TRACT | Status: DC
  Administered 2022-01-29 – 2022-01-30 (×2): 1 via RESPIRATORY_TRACT
  Filled 2022-01-25: qty 28

## 2022-01-25 MED ORDER — VANCOMYCIN HCL IN DEXTROSE 1-5 GM/200ML-% IV SOLN
1000.0000 mg | Freq: Once | INTRAVENOUS | Status: AC
  Administered 2022-01-25: 1000 mg via INTRAVENOUS
  Filled 2022-01-25: qty 200

## 2022-01-25 MED ORDER — POVIDONE-IODINE 10 % EX SWAB
2.0000 "application " | Freq: Once | CUTANEOUS | Status: AC
  Administered 2022-01-26 – 2022-01-27 (×2): 2 via TOPICAL

## 2022-01-25 MED ORDER — CITALOPRAM HYDROBROMIDE 20 MG PO TABS
40.0000 mg | ORAL_TABLET | Freq: Every day | ORAL | Status: DC
  Administered 2022-01-26 – 2022-01-30 (×5): 40 mg via ORAL
  Filled 2022-01-25 (×5): qty 2

## 2022-01-25 MED ORDER — INSULIN ASPART 100 UNIT/ML IJ SOLN
0.0000 [IU] | Freq: Three times a day (TID) | INTRAMUSCULAR | Status: DC
  Administered 2022-01-26: 11 [IU] via SUBCUTANEOUS
  Administered 2022-01-26: 4 [IU] via SUBCUTANEOUS
  Administered 2022-01-27: 7 [IU] via SUBCUTANEOUS
  Administered 2022-01-27: 3 [IU] via SUBCUTANEOUS
  Administered 2022-01-27: 4 [IU] via SUBCUTANEOUS
  Administered 2022-01-28: 7 [IU] via SUBCUTANEOUS
  Administered 2022-01-28: 4 [IU] via SUBCUTANEOUS
  Administered 2022-01-29: 5 [IU] via SUBCUTANEOUS
  Administered 2022-01-29 (×2): 4 [IU] via SUBCUTANEOUS
  Administered 2022-01-30: 7 [IU] via SUBCUTANEOUS
  Administered 2022-01-30: 4 [IU] via SUBCUTANEOUS

## 2022-01-25 MED ORDER — NALOXEGOL OXALATE 25 MG PO TABS
25.0000 mg | ORAL_TABLET | Freq: Every morning | ORAL | Status: DC
  Administered 2022-01-26 – 2022-01-30 (×5): 25 mg via ORAL
  Filled 2022-01-25 (×5): qty 1

## 2022-01-25 MED ORDER — CELECOXIB 200 MG PO CAPS
200.0000 mg | ORAL_CAPSULE | Freq: Once | ORAL | Status: AC
  Administered 2022-01-25: 200 mg via ORAL
  Filled 2022-01-25: qty 1

## 2022-01-25 MED ORDER — FLUTICASONE PROPIONATE 50 MCG/ACT NA SUSP
2.0000 | Freq: Every day | NASAL | Status: DC | PRN
  Filled 2022-01-25: qty 16

## 2022-01-25 MED ORDER — LORATADINE 10 MG PO TABS
10.0000 mg | ORAL_TABLET | Freq: Every day | ORAL | Status: DC
  Administered 2022-01-26 – 2022-01-30 (×5): 10 mg via ORAL
  Filled 2022-01-25 (×5): qty 1

## 2022-01-25 MED ORDER — GLIPIZIDE 5 MG PO TABS
10.0000 mg | ORAL_TABLET | Freq: Two times a day (BID) | ORAL | Status: DC
Start: 2022-01-26 — End: 2022-01-26

## 2022-01-25 MED ORDER — VANCOMYCIN HCL 1250 MG/250ML IV SOLN
1250.0000 mg | Freq: Two times a day (BID) | INTRAVENOUS | Status: DC
  Administered 2022-01-26 – 2022-01-29 (×7): 1250 mg via INTRAVENOUS
  Filled 2022-01-25 (×9): qty 250

## 2022-01-25 MED ORDER — OXYCODONE HCL 5 MG PO TABS
5.0000 mg | ORAL_TABLET | ORAL | Status: DC | PRN
  Administered 2022-01-25 – 2022-01-26 (×2): 5 mg via ORAL
  Filled 2022-01-25: qty 1

## 2022-01-25 MED ORDER — MUPIROCIN 2 % EX OINT
1.0000 "application " | TOPICAL_OINTMENT | Freq: Two times a day (BID) | CUTANEOUS | Status: DC
  Administered 2022-01-27 – 2022-01-29 (×3): 1 via NASAL
  Filled 2022-01-25 (×2): qty 22

## 2022-01-25 MED ORDER — EMPAGLIFLOZIN 10 MG PO TABS
10.0000 mg | ORAL_TABLET | Freq: Every day | ORAL | Status: DC
  Filled 2022-01-25: qty 1

## 2022-01-25 MED ORDER — ACETAMINOPHEN 500 MG PO TABS
1000.0000 mg | ORAL_TABLET | Freq: Once | ORAL | Status: AC
  Administered 2022-01-26: 1000 mg via ORAL
  Filled 2022-01-25: qty 2

## 2022-01-25 MED ORDER — TAPENTADOL HCL 50 MG PO TABS
75.0000 mg | ORAL_TABLET | Freq: Three times a day (TID) | ORAL | Status: DC | PRN
  Administered 2022-01-25 – 2022-01-29 (×11): 75 mg via ORAL
  Filled 2022-01-25 (×11): qty 2

## 2022-01-25 MED ORDER — IPRATROPIUM-ALBUTEROL 0.5-2.5 (3) MG/3ML IN SOLN
3.0000 mL | RESPIRATORY_TRACT | Status: DC | PRN
  Administered 2022-01-26 (×2): 3 mL via RESPIRATORY_TRACT
  Filled 2022-01-25 (×2): qty 3

## 2022-01-25 MED ORDER — CYCLOBENZAPRINE HCL 10 MG PO TABS
10.0000 mg | ORAL_TABLET | Freq: Two times a day (BID) | ORAL | Status: DC | PRN
  Administered 2022-01-25 – 2022-01-29 (×3): 10 mg via ORAL
  Filled 2022-01-25 (×3): qty 1

## 2022-01-25 MED ORDER — METFORMIN HCL ER 500 MG PO TB24: 1000.0000 mg | ORAL_TABLET | Freq: Two times a day (BID) | ORAL | Status: DC

## 2022-01-25 MED ORDER — TAMSULOSIN HCL 0.4 MG PO CAPS
0.4000 mg | ORAL_CAPSULE | Freq: Every day | ORAL | Status: DC
Start: 2022-01-26 — End: 2022-01-30
  Administered 2022-01-26 – 2022-01-30 (×5): 0.4 mg via ORAL
  Filled 2022-01-25 (×5): qty 1

## 2022-01-25 MED ORDER — ACETAMINOPHEN 500 MG PO TABS: 1000.0000 mg | ORAL_TABLET | Freq: Once | ORAL | Status: AC

## 2022-01-25 MED ORDER — BUPROPION HCL ER (SR) 150 MG PO TB12
150.0000 mg | ORAL_TABLET | Freq: Two times a day (BID) | ORAL | Status: DC
  Administered 2022-01-25 – 2022-01-30 (×10): 150 mg via ORAL
  Filled 2022-01-25 (×11): qty 1

## 2022-01-25 MED ORDER — ALBUTEROL SULFATE (2.5 MG/3ML) 0.083% IN NEBU
2.5000 mg | INHALATION_SOLUTION | Freq: Four times a day (QID) | RESPIRATORY_TRACT | Status: DC | PRN
  Administered 2022-01-27 – 2022-01-29 (×2): 2.5 mg via RESPIRATORY_TRACT
  Filled 2022-01-25 (×2): qty 3

## 2022-01-25 MED ORDER — HYDROMORPHONE HCL 1 MG/ML IJ SOLN
1.0000 mg | Freq: Once | INTRAMUSCULAR | Status: AC
  Administered 2022-01-26: 1 mg via INTRAVENOUS
  Filled 2022-01-25: qty 1

## 2022-01-25 MED ORDER — ATORVASTATIN CALCIUM 40 MG PO TABS
40.0000 mg | ORAL_TABLET | Freq: Every day | ORAL | Status: DC
  Administered 2022-01-26 – 2022-01-30 (×5): 40 mg via ORAL
  Filled 2022-01-25 (×6): qty 1

## 2022-01-25 MED ORDER — SODIUM CHLORIDE 0.9 % IV SOLN
2.0000 g | Freq: Three times a day (TID) | INTRAVENOUS | Status: DC
  Administered 2022-01-25 – 2022-01-30 (×14): 2 g via INTRAVENOUS
  Filled 2022-01-25 (×14): qty 2

## 2022-01-25 MED ORDER — PANTOPRAZOLE SODIUM 40 MG PO TBEC
40.0000 mg | DELAYED_RELEASE_TABLET | Freq: Every day | ORAL | Status: DC
Start: 2022-01-26 — End: 2022-01-30
  Administered 2022-01-26 – 2022-01-30 (×5): 40 mg via ORAL
  Filled 2022-01-25 (×6): qty 1

## 2022-01-25 MED ORDER — ZOLPIDEM TARTRATE 5 MG PO TABS
10.0000 mg | ORAL_TABLET | Freq: Every evening | ORAL | Status: DC | PRN
  Administered 2022-01-25 – 2022-01-29 (×3): 10 mg via ORAL
  Filled 2022-01-25 (×3): qty 2

## 2022-01-25 MED ORDER — GABAPENTIN 300 MG PO CAPS
600.0000 mg | ORAL_CAPSULE | Freq: Two times a day (BID) | ORAL | Status: DC
Start: 2022-01-25 — End: 2022-01-30
  Administered 2022-01-25 – 2022-01-30 (×10): 600 mg via ORAL
  Filled 2022-01-25 (×10): qty 2

## 2022-01-25 NOTE — H&P (Signed)
PREOPERATIVE H&P  Chief Complaint: POST OP INFECTION; RIGHT KNEE  HPI: Joseph [name] is a 52 y.o. male who presented to the office today for follow up of an ongoing right knee infection that is currently being treated by ID with ABX via PICC line. This all started after the patient took a bath in the immediate post-op period following a right knee arthroscopy with menisectomy and chondroplasty by Dr. Percell Miller on 12/20/21. Despite paperwork specifically telling him to not submerge the incisions in water, he took a bath and all his issues began shortly after that. He was also subsequently diagnosed with a right calf DVT for which he ws seen at [facility] and is on 3 months of Xarelto.   Today he reports increased pain and swelling in the right knee and calf, especially the proximal calf. Any ankle ROM increases this calf pain to the point he feels "the calf will rupture open". He said it has been like that for the past few days. He is worried he has another infection and that the current ABX are not working. He is also concerned with his PICC site that has continuously been bleeding since it was placed. He has not taken his temperature lately but has felt a little fever and chills. Appetite has been minimal for weeks. He has been largely unable to WB on the right leg.   We performed an U/S guided exam in the office of his right proximal gastrocnemius area where the main pain and swelling is located and could see a very large area of fluid consistent with likely an abscess. This area was aspirated under U/S guidance and about 10cc of light brown frank pus was removed. He again was given a diagnosis of POST OP INFECTION; RIGHT KNEE. Symptoms are rated as moderate to severe, and have been worsening.  This is significantly impairing activities of daily living.  He has elected for surgical management.   Past Medical History:  Diagnosis Date   Acute encephalopathy 03/24/2018   Anxiety    Arthritis    oa left knee and  lower back   Asthma    Cataract    hx of bilateral   Chronic back pain    Chronic pain of left knee    COPD (chronic obstructive pulmonary disease) (HCC)    Diabetes mellitus    type 2   DVT (deep venous thrombosis) ([place]) 01/09/2022   Encephalopathy acute 03/24/2018   GERD (gastroesophageal reflux disease)    INGUINAL PAIN, LEFT 10/03/2009   Qualifier: Diagnosis of  By: Oneida Alar MD, Sandi L    Oral thrush 11/10/2018   Overdose    03-24-18 accidental   Rash and nonspecific skin eruption 03/12/2017   Septic arthritis ([place]) 01/09/2022   Shortness of breath    with heavy exertion   Sleep apnea    Past Surgical History:  Procedure Laterality Date   CARPAL TUNNEL RELEASE Left    CATARACT EXTRACTION W/PHACO Right 03/11/2016   Procedure: CATARACT EXTRACTION PHACO AND INTRAOCULAR LENS PLACEMENT ([place]);  Surgeon: Tonny Branch, MD;  Location: [facility];  Service: Ophthalmology;  Laterality: Right;  CDE 4.24   EYE SURGERY Right 2018   ioc with lens replacement   HERNIA REPAIR Bilateral 5009   umbilical   INCISION AND DRAINAGE ABSCESS Right 01/02/2022   Procedure: RIGHT KNEE ARTHROSCOPIC IRRIGATION AND DEBRIDEMENT;  Surgeon: Willaim Sheng, MD;  Location: [facility];  Service: Orthopedics;  Laterality: Right;   KNEE ARTHROSCOPY WITH MEDIAL  MENISECTOMY Left 04/22/2014   Procedure: LEFT KNEE ARTHROSCOPY WITH MEDIAL MENISECTOMY;  Surgeon: Carole Civil, MD;  Location: [facility];  Service: Orthopedics;  Laterality: Left;   KNEE ARTHROSCOPY WITH MEDIAL MENISECTOMY Right 12/06/2015   Procedure: RIGHT KNEE ARTHROSCOPY WITH MEDIAL MENISECTOMY;  Surgeon: Carole Civil, MD;  Location: [facility];  Service: Orthopedics;  Laterality: Right;   KNEE ARTHROSCOPY WITH MEDIAL MENISECTOMY Left 03/20/2017   Procedure: KNEE ARTHROSCOPY WITH MEDIAL MENISECTOMY;  Surgeon: Carole Civil, MD;  Location: [facility];  Service: Orthopedics;  Laterality: Left;   ORIF WRIST FRACTURE Left 12/19/2016   Procedure:  OPEN REDUCTION INTERNAL FIXATION (ORIF) WRIST FRACTURE;  Surgeon: Leanora Cover, MD;  Location: [facility];  Service: Orthopedics;  Laterality: Left;  accumed    PARTIAL KNEE ARTHROPLASTY Left 07/21/2018   Procedure: LEFT UNICOMPARTMENTAL KNEE;  Surgeon: Renette Butters, MD;  Location: [facility];  Service: Orthopedics;  Laterality: Left;   SHOULDER ARTHROCENTESIS Right 2008 or 2010   VASECTOMY  2014   Social History   Socioeconomic History   Marital status: Divorced    Spouse name: Not on file   Number of children: Not on file   Years of education: Not on file   Highest education level: Not on file  Occupational History   Occupation: disability  Tobacco Use   Smoking status: Every Day    Packs/day: 1.25    Years: 26.00    Pack years: 32.50    Types: Cigarettes    Start date: 12/17/1993   Smokeless tobacco: Never   Tobacco comments:    1 ppd 01/04/2020  Vaping Use   Vaping Use: Former  Substance and Sexual Activity   Alcohol use: Never    Alcohol/week: 0.0 standard drinks   Drug use: Never   Sexual activity: Yes    Birth control/protection: Surgical  Other Topics Concern   Not on file  Social History Narrative   Recent visit to [facility] in [place]  d/t increased pain where he was prescribed percocet 7.5-325 mg for pain.   Social Determinants of Health   Financial Resource Strain: Not on file  Food Insecurity: Not on file  Transportation Needs: Not on file  Physical Activity: Not on file  Stress: Not on file  Social Connections: Not on file   Family History  Problem Relation Age of Onset   Emphysema Maternal Grandfather    Emphysema Maternal Grandmother    Clotting disorder Maternal Grandmother    Allergies  Allergen Reactions   Azithromycin Hives   Clarithromycin    Erythromycin Hives   Keflex [Cephalexin] Nausea Only   Metformin Nausea And Vomiting   Symbicort [Budesonide-Formoterol Fumarate] Other (See Comments)    States that 3 doses were used and  breathing became worse   Prior to Admission medications   Medication Sig Start Date End Date Taking? Authorizing Provider  albuterol (PROVENTIL) (2.5 MG/3ML) 0.083% nebulizer solution USE 3 ML(1 VIAL) VIA NEBULIZER FOUR TIMES DAILY AS NEEDED FOR SHORTNESS OF BREATH 12/07/21   Tanda Rockers, MD  ANDROGEL PUMP 20.25 MG/ACT (1.62%) GEL Apply 2 Pump topically daily.  02/25/18   [provider]  aspirin EC 81 MG tablet Take 1 tablet (81 mg total) by mouth 2 (two) times daily for 28 days. Swallow whole. Patient not taking: Reported on 01/22/2022 01/03/22 01/31/22  Willaim Sheng, MD  atorvastatin (LIPITOR) 40 MG tablet TAKE ONE TABLET (40MG TOTAL) BY MOUTH DAILY FOR CHOLESTEROL 03/27/21   Pleas Koch,  NP  benzonatate (TESSALON) 200 MG capsule Take 1 capsule (200 mg total) by mouth 3 (three) times daily as needed for cough. 11/20/21   Pleas Koch, NP  blood glucose meter kit and supplies KIT Dispense based on patient and insurance preference. Use up to four times daily as directed. (FOR ICD-9 250.00, 250.01). 06/28/19   Pleas Koch, NP  buPROPion [facility] SR) 150 MG 12 hr tablet Take 1 tablet (150 mg total) by mouth 2 (two) times daily. For anxiety and depression. 10/23/21   Pleas Koch, NP  cefTRIAXone (ROCEPHIN) 10 g injection  01/15/22   [provider]  cetirizine (ZYRTEC) 10 MG tablet Take 1 tablet by mouth at bedtime for allergies. 12/24/21   Pleas Koch, NP  ciprofloxacin (CIPRO) 500 MG tablet Take 500 mg by mouth 2 (two) times daily. 01/02/22   [provider]  citalopram (CELEXA) 40 MG tablet Take 1 tablet (40 mg total) by mouth daily. For anxiety and depression. 10/23/21   Pleas Koch, NP  Continuous Blood Gluc Sensor (FREESTYLE LIBRE 3 SENSOR) MISC 1 Units by Does not apply route 4 (four) times daily. Place 1 sensor on the skin every 14 days. Use to check glucose continuously 10/23/21   Pleas Koch, NP  cyclobenzaprine  (FLEXERIL) 10 MG tablet Take 10 mg by mouth 2 (two) times daily as needed.    [provider]  doxycycline (VIBRA-TABS) 100 MG tablet Take 1 tablet (100 mg total) by mouth 2 (two) times daily. 12/18/21   Tanda Rockers, MD  empagliflozin (JARDIANCE) 10 MG TABS tablet Take 1 tablet (10 mg total) by mouth daily. For diabetes. 10/23/21   Pleas Koch, NP  fluticasone (FLONASE) 50 MCG/ACT nasal spray Place 2 sprays into both nostrils daily as needed for allergies or rhinitis. 10/23/21   Pleas Koch, NP  fluticasone furoate-vilanterol (BREO ELLIPTA) 200-25 MCG/INH AEPB Inhale 1 puff into the lungs daily. 06/13/21   Tanda Rockers, MD  gabapentin (NEURONTIN) 300 MG capsule Take 2 capsules (600 mg total) by mouth 2 (two) times daily. For pain. 12/24/21   Pleas Koch, NP  glipiZIDE (GLUCOTROL) 10 MG tablet Take 1 tablet (10 mg total) by mouth 2 (two) times daily before a meal. For diabetes. 01/04/22   Pleas Koch, NP  insulin degludec (TRESIBA) 100 UNIT/ML FlexTouch Pen Inject 35 Units into the skin daily. For diabetes. 10/23/21   Pleas Koch, NP  Insulin Pen Needle (UNIFINE PENTIPS) 31G X 6 MM MISC USE NIGHTLY WITH INSULIN 06/26/21   Pleas Koch, NP  Ipratropium-Albuterol (COMBIVENT RESPIMAT) 20-100 MCG/ACT AERS respimat INHALE ONE PUFF INTO THE LUNGS EVERY FOUR HOURS AS NEEDED FOR WHEEZING 09/17/21   Tanda Rockers, MD  Lancets (ONETOUCH DELICA PLUS [id number]) MISC USE TO CHECK BLOOD SUGAR UP TO FOUR TIMES DAILY 09/14/19   Pleas Koch, NP  lansoprazole (PREVACID) 30 MG capsule Take 1 capsule (30 mg total) by mouth 2 (two) times daily before a meal. For heartburn. 11/06/21   Pleas Koch, NP  metFORMIN (GLUCOPHAGE-XR) 500 MG 24 hr tablet Take 2 tablets (1,000 mg total) by mouth 2 (two) times daily with a meal. For diabetes. 01/03/22   Michela Pitcher, NP  MOVANTIK 25 MG TABS tablet Take 25 mg by mouth every morning. 09/13/21   [provider]   NUCYNTA 75 MG tablet Take 75 mg by mouth 3 (three) times daily as needed. 10/22/21   [provider]  ONETOUCH VERIO test strip USE TO CHECK BLOOD SUGAR UP TO FOUR TIMES DAILY 09/14/19   Pleas Koch, NP  oxyCODONE (OXY IR/ROXICODONE) 5 MG immediate release tablet Take 5 mg by mouth every 6 (six) hours as needed. 01/21/22   [provider]  rivaroxaban (XARELTO) 20 MG TABS tablet Take 1 tablet (20 mg total) by mouth daily with supper. 01/22/22 04/22/22  Cherre Robins, MD  tamsulosin (FLOMAX) 0.4 MG CAPS capsule TAKE ONE CAPSULE BY MOUTH DAILY 07/21/19   Pleas Koch, NP  Turmeric 500 MG CAPS Take 1 capsule by mouth daily.    [provider]  zolpidem (AMBIEN) 10 MG tablet TAKE ONE TABLET (10MG TOTAL) BY MOUTH ATBEDTIME AS NEEDED FOR SLEEP 01/23/22   Pleas Koch, NP     Positive ROS: All other systems have been reviewed and were otherwise negative with the exception of those mentioned in the HPI and as above.  Physical Exam: General: Alert, no acute distress Cardiovascular: No pedal edema Respiratory: No cyanosis, no use of accessory musculature GI: No organomegaly, abdomen is soft and non-tender Skin: No lesions in the area of chief complaint Neurologic: Sensation intact distally Psychiatric: Patient is competent for consent with normal mood and affect Lymphatic: No axillary or cervical lymphadenopathy  MUSCULOSKELETAL: RLE- TTP medial and lateral joint line, medial femoral condyle, and gastrocnemius; knee ROM limited to 50-95 degrees, ankle ROM intact but painful especially with plantarflexion; proximal gastrocnemius area is severely edematous compared to the rest of the calf and lower leg, difficult to compress the area but rest of calf is soft and compressible; effusion present in suprapatellar and distal thigh area above knee; 1+ pitting edema throughout ankle and foot; entire leg has blanching erythema   Imaging: U/S showed large area  consisted with abscess in proximal posterior calf   Assessment: POST OP INFECTION; RIGHT KNEE  Plan: Plan for Procedure(s): INCISION AND DRAINAGE ABSCESS POST OP RIGHT KNEE ARTHROSCOPY   Working on direct admitting the patient to the hospital. Have spoken with Dr. Karmen Bongo who has accepted him for admission. Waiting on a bed. Will order STAT right knee and right tib/fib MRI to assess the full extent of the infection in the knee and calf to help with surgical planning. Tentatively planning for a right knee/calf I&D with Dr. Zachery Dakins in the morning tomorrow at [facility]. Cultures will be taken intra-op and send to the lab for analysis to identify any specimens. ID will choose ABX.     The risks benefits and alternatives were discussed with the patient including but not limited to the risks of nonoperative treatment, versus surgical intervention including infection, bleeding, nerve injury,  blood clots, cardiopulmonary complications, morbidity, mortality, among others, and they were willing to proceed.   Weightbearing: WBAT RLE Orthopedic devices: n/a Showering: hold for now Dressing: reinforce as needed Medicines: TBD, will remain on IV ABX and Xarelto, is also on chronic narcotics from pain management doctor  Discharge: home Follow up: 1 week     Alisa Graff Office [phone] 01/25/2022 1:20 PM

## 2022-01-25 NOTE — Anesthesia Preprocedure Evaluation (Addendum)
Anesthesia Evaluation  Patient identified by MRN, date of birth, ID band Patient awake    Reviewed: Allergy & Precautions, NPO status , Patient's Chart, lab work & pertinent test results  Airway Mallampati: II  TM Distance: >3 FB Neck ROM: Full    Dental no notable dental hx.    Pulmonary asthma , sleep apnea , COPD,  COPD inhaler, Current Smoker and Patient abstained from smoking.,    Pulmonary exam normal        Cardiovascular + DVT (02/23, on Xarelto)   Rhythm:Regular Rate:Normal     Neuro/Psych Anxiety Depression    GI/Hepatic Neg liver ROS, GERD  Medicated,  Endo/Other  diabetes, Type 2, Oral Hypoglycemic Agents, Insulin Dependent  Renal/GU negative Renal ROS  negative genitourinary   Musculoskeletal  (+) Arthritis , Right knee septic arthritis    Abdominal Normal abdominal exam  (+)   Peds  Hematology negative hematology ROS (+)   Anesthesia Other Findings   Reproductive/Obstetrics                            Anesthesia Physical Anesthesia Plan  ASA: 3  Anesthesia Plan: General   Post-op Pain Management: Tylenol PO (pre-op)*, Celebrex PO (pre-op)* and Gabapentin PO (pre-op)*   Induction: Intravenous  PONV Risk Score and Plan: 1 and Ondansetron, Dexamethasone, Midazolam and Treatment may vary due to age or medical condition  Airway Management Planned: Mask and LMA  Additional Equipment: None  Intra-op Plan:   Post-operative Plan: Extubation in OR  Informed Consent: I have reviewed the patients History and Physical, chart, labs and discussed the procedure including the risks, benefits and alternatives for the proposed anesthesia with the patient or authorized representative who has indicated his/her understanding and acceptance.     Dental advisory given  Plan Discussed with: CRNA  Anesthesia Plan Comments: (Lab Results      Component                Value                Date                      WBC                      10.9 (H)            01/25/2022                HGB                      12.6 (L)            01/25/2022                HCT                      39.0                01/25/2022                MCV                      85.9                01/25/2022                PLT  350                 01/25/2022           Lab Results      Component                Value               Date                      NA                       132 (L)             01/11/2022                K                        4.5                 01/11/2022                CO2                      23                  01/11/2022                GLUCOSE                  398 (H)             01/11/2022                BUN                      11                  01/11/2022                CREATININE               0.93                01/11/2022                CALCIUM                  8.9                 01/11/2022                EGFR                     99                  01/11/2022                GFRNONAA                 >60                 03/05/2021          )       Anesthesia Quick Evaluation

## 2022-01-25 NOTE — ED Triage Notes (Signed)
Pt was sent by ortho due to continued post of infection of right knee.  Pt has been on IV antibiotics at home via picc line and infection is progressing.  Pt has aspiration of infection from right calf today and was sent here for evaluation and admission.  Pt is being followed by Dr Algis Liming from ID for this as well.  Pt has had some chills with this and has knee and calf swelling on right.

## 2022-01-25 NOTE — ED Notes (Signed)
Called to give report to night shift nurse. Charge RN will have nurse call back

## 2022-01-25 NOTE — Progress Notes (Signed)
Secure chatted nurse to put a PICC line order in for team to assess for possible PICC placement. Joseph [name]

## 2022-01-25 NOTE — ED Notes (Signed)
[facility] called for transport to [id number], spoke with Cala Bradford

## 2022-01-25 NOTE — Progress Notes (Signed)
Pharmacy Antibiotic Note  Joseph [name] is a 51 y.o. male admitted on 01/25/2022 with  wound infection .  Pharmacy has been consulted for vancomycin dosing.  Patient with a recent history of arthroscopic knee surgery at the end of January. Patient presenting with knee pain and calf swelling.  SCr 0.69 - at baseline WBC 10.9; T 98.1 F COVID/Flu - neg  Plan: Cefepime per MD Vancomycin 2000 mg once then 1250 mg q12hr (eAUC 480) unless change in renal function Trend WBC, Fever, Renal function, & Clinical course F/u cultures, clinical course, WBC, fever De-escalate when able Levels at steady state     Temp (24hrs), Avg:98.1 F (36.7 C), Min:98.1 F (36.7 C), Max:98.1 F (36.7 C)  Recent Labs  Lab 01/25/22 1523  WBC 10.9*    Estimated Creatinine Clearance: 114.6 mL/min (by C-G formula based on SCr of 0.93 mg/dL).    Allergies  Allergen Reactions   Azithromycin Hives   Clarithromycin    Erythromycin Hives   Keflex [Cephalexin] Nausea Only   Metformin Nausea And Vomiting   Symbicort [Budesonide-Formoterol Fumarate] Other (See Comments)    States that 3 doses were used and breathing became worse    Antimicrobials this admission: cefepime 2/24 >>  vancomycin 2/24 >>  Microbiology results: Pending  Thank you for allowing pharmacy to be a part of this patients care.  Lorelei Pont, PharmD, BCPS 01/25/2022 4:03 PM ED Clinical Pharmacist -  [phone]

## 2022-01-25 NOTE — ED Provider Notes (Signed)
[facility] EMERGENCY DEPT Provider Note   CSN: [id number] Arrival date & time: 01/25/22  1447     History  Chief Complaint  Patient presents with   Knee Pain    infection    Joseph [name] is a 51 y.o. male.  He had arthroscopic knee surgery at the end of January.  Ended up needing a washout the next week due to infection.  He has been on IV ceftriaxone via PICC line since then.  Continue to have pain and now with calf swelling.  Went to [facility] today where they aspirated an abscess and told him he needs to go back to the operating room.  Recommended he come here to get blood work and admission over to [facility].  He denies any recent fever.  States has been taking Nucynta and oxycodone for pain.  No nausea vomiting diarrhea  The history is provided by the patient.  Knee Pain Location:  Knee and leg Time since incident:  1 month Injury: no   Leg location:  R lower leg Knee location:  R knee Pain details:    Quality:  Throbbing   Severity:  Moderate   Onset quality:  Gradual   Timing:  Constant   Progression:  Waxing and waning Chronicity:  New Worsened by:  Bearing weight Associated symptoms: decreased ROM, fever (none recent) and swelling       Home Medications Prior to Admission medications   Medication Sig Start Date End Date Taking? Authorizing Provider  albuterol (PROVENTIL) (2.5 MG/3ML) 0.083% nebulizer solution USE 3 ML(1 VIAL) VIA NEBULIZER FOUR TIMES DAILY AS NEEDED FOR SHORTNESS OF BREATH 12/07/21   Tanda Rockers, MD  ANDROGEL PUMP 20.25 MG/ACT (1.62%) GEL Apply 2 Pump topically daily.  02/25/18   [provider]  aspirin EC 81 MG tablet Take 1 tablet (81 mg total) by mouth 2 (two) times daily for 28 days. Swallow whole. Patient not taking: Reported on 01/22/2022 01/03/22 01/31/22  Willaim Sheng, MD  atorvastatin (LIPITOR) 40 MG tablet TAKE ONE TABLET (40MG TOTAL) BY MOUTH DAILY FOR CHOLESTEROL 03/27/21   Pleas Koch, NP   benzonatate (TESSALON) 200 MG capsule Take 1 capsule (200 mg total) by mouth 3 (three) times daily as needed for cough. 11/20/21   Pleas Koch, NP  blood glucose meter kit and supplies KIT Dispense based on patient and insurance preference. Use up to four times daily as directed. (FOR ICD-9 250.00, 250.01). 06/28/19   Pleas Koch, NP  buPROPion [facility] SR) 150 MG 12 hr tablet Take 1 tablet (150 mg total) by mouth 2 (two) times daily. For anxiety and depression. 10/23/21   Pleas Koch, NP  cefTRIAXone (ROCEPHIN) 10 g injection  01/15/22   [provider]  cetirizine (ZYRTEC) 10 MG tablet Take 1 tablet by mouth at bedtime for allergies. 12/24/21   Pleas Koch, NP  ciprofloxacin (CIPRO) 500 MG tablet Take 500 mg by mouth 2 (two) times daily. 01/02/22   [provider]  citalopram (CELEXA) 40 MG tablet Take 1 tablet (40 mg total) by mouth daily. For anxiety and depression. 10/23/21   Pleas Koch, NP  Continuous Blood Gluc Sensor (FREESTYLE LIBRE 3 SENSOR) MISC 1 Units by Does not apply route 4 (four) times daily. Place 1 sensor on the skin every 14 days. Use to check glucose continuously 10/23/21   Pleas Koch, NP  cyclobenzaprine (FLEXERIL) 10 MG tablet Take 10 mg by mouth 2 (  two) times daily as needed.    [provider]  doxycycline (VIBRA-TABS) 100 MG tablet Take 1 tablet (100 mg total) by mouth 2 (two) times daily. 12/18/21   Tanda Rockers, MD  empagliflozin (JARDIANCE) 10 MG TABS tablet Take 1 tablet (10 mg total) by mouth daily. For diabetes. 10/23/21   Pleas Koch, NP  fluticasone (FLONASE) 50 MCG/ACT nasal spray Place 2 sprays into both nostrils daily as needed for allergies or rhinitis. 10/23/21   Pleas Koch, NP  fluticasone furoate-vilanterol (BREO ELLIPTA) 200-25 MCG/INH AEPB Inhale 1 puff into the lungs daily. 06/13/21   Tanda Rockers, MD  gabapentin (NEURONTIN) 300 MG capsule Take 2 capsules (600 mg total)  by mouth 2 (two) times daily. For pain. 12/24/21   Pleas Koch, NP  glipiZIDE (GLUCOTROL) 10 MG tablet Take 1 tablet (10 mg total) by mouth 2 (two) times daily before a meal. For diabetes. 01/04/22   Pleas Koch, NP  insulin degludec (TRESIBA) 100 UNIT/ML FlexTouch Pen Inject 35 Units into the skin daily. For diabetes. 10/23/21   Pleas Koch, NP  Insulin Pen Needle (UNIFINE PENTIPS) 31G X 6 MM MISC USE NIGHTLY WITH INSULIN 06/26/21   Pleas Koch, NP  Ipratropium-Albuterol (COMBIVENT RESPIMAT) 20-100 MCG/ACT AERS respimat INHALE ONE PUFF INTO THE LUNGS EVERY FOUR HOURS AS NEEDED FOR WHEEZING 09/17/21   Tanda Rockers, MD  Lancets (ONETOUCH DELICA PLUS [id number]) MISC USE TO CHECK BLOOD SUGAR UP TO FOUR TIMES DAILY 09/14/19   Pleas Koch, NP  lansoprazole (PREVACID) 30 MG capsule Take 1 capsule (30 mg total) by mouth 2 (two) times daily before a meal. For heartburn. 11/06/21   Pleas Koch, NP  metFORMIN (GLUCOPHAGE-XR) 500 MG 24 hr tablet Take 2 tablets (1,000 mg total) by mouth 2 (two) times daily with a meal. For diabetes. 01/03/22   Michela Pitcher, NP  MOVANTIK 25 MG TABS tablet Take 25 mg by mouth every morning. 09/13/21   [provider]  NUCYNTA 75 MG tablet Take 75 mg by mouth 3 (three) times daily as needed. 10/22/21   [provider]  ONETOUCH VERIO test strip USE TO CHECK BLOOD SUGAR UP TO FOUR TIMES DAILY 09/14/19   Pleas Koch, NP  oxyCODONE (OXY IR/ROXICODONE) 5 MG immediate release tablet Take 5 mg by mouth every 6 (six) hours as needed. 01/21/22   [provider]  rivaroxaban (XARELTO) 20 MG TABS tablet Take 1 tablet (20 mg total) by mouth daily with supper. 01/22/22 04/22/22  Cherre Robins, MD  tamsulosin (FLOMAX) 0.4 MG CAPS capsule TAKE ONE CAPSULE BY MOUTH DAILY 07/21/19   Pleas Koch, NP  Turmeric 500 MG CAPS Take 1 capsule by mouth daily.    [provider]  zolpidem (AMBIEN) 10 MG tablet TAKE ONE  TABLET (10MG TOTAL) BY MOUTH ATBEDTIME AS NEEDED FOR SLEEP 01/23/22   Pleas Koch, NP      Allergies    Azithromycin, Clarithromycin, Erythromycin, Keflex [cephalexin], Metformin, and Symbicort [budesonide-formoterol fumarate]    Review of Systems   Review of Systems  Constitutional:  Positive for fever (none recent).  HENT:  Negative for sore throat.   Eyes:  Negative for visual disturbance.  Respiratory:  Negative for shortness of breath.   Cardiovascular:  Negative for chest pain.  Gastrointestinal:  Negative for abdominal pain.  Genitourinary:  Negative for dysuria.  Musculoskeletal:  Positive for gait problem and joint swelling.  Skin:  Negative for rash.  Neurological:  Negative for headaches.   Physical Exam Updated Vital Signs BP 138/85 (BP Location: Left Arm)    Pulse 99    Temp 98.1 F (36.7 C) (Oral)    Resp 16    SpO2 96%  Physical Exam Vitals and nursing note reviewed.  Constitutional:      General: He is not in acute distress.    Appearance: He is well-developed.  HENT:     Head: Normocephalic and atraumatic.  Eyes:     Conjunctiva/sclera: Conjunctivae normal.  Cardiovascular:     Rate and Rhythm: Normal rate and regular rhythm.     Heart sounds: No murmur heard. Pulmonary:     Effort: Pulmonary effort is normal. No respiratory distress.     Breath sounds: Normal breath sounds.  Abdominal:     Palpations: Abdomen is soft.     Tenderness: There is no abdominal tenderness.  Musculoskeletal:        General: Swelling and tenderness present.     Cervical back: Neck supple.     Comments: He has a diffusely swollen and tender right knee.  No overlying erythema.  There is a hard tender area in his upper calf on the right side.  Distal neurovascular intact  Right upper arm PICC line without signs of infection  Skin:    General: Skin is warm and dry.     Capillary Refill: Capillary refill takes less than 2 seconds.  Neurological:     General: No focal  deficit present.     Mental Status: He is alert.  Psychiatric:        Mood and Affect: Mood normal.    ED Results / Procedures / Treatments   Labs (all labs ordered are listed, but only abnormal results are displayed) Labs Reviewed  BASIC METABOLIC PANEL - Abnormal; Notable for the following components:      Result Value   Potassium 3.2 (*)    Glucose, Bld 217 (*)    All other components within normal limits  CBC WITH DIFFERENTIAL/PLATELET - Abnormal; Notable for the following components:   WBC 10.9 (*)    Hemoglobin 12.6 (*)    All other components within normal limits  SEDIMENTATION RATE - Abnormal; Notable for the following components:   Sed Rate 92 (*)    All other components within normal limits  C-REACTIVE PROTEIN - Abnormal; Notable for the following components:   CRP 4.6 (*)    All other components within normal limits  HEMOGLOBIN A1C - Abnormal; Notable for the following components:   Hgb A1c MFr Bld 7.5 (*)    All other components within normal limits  GLUCOSE, CAPILLARY - Abnormal; Notable for the following components:   Glucose-Capillary 196 (*)    All other components within normal limits  GLUCOSE, CAPILLARY - Abnormal; Notable for the following components:   Glucose-Capillary 121 (*)    All other components within normal limits  GLUCOSE, CAPILLARY - Abnormal; Notable for the following components:   Glucose-Capillary 159 (*)    All other components within normal limits  CULTURE, BLOOD (ROUTINE X 2)  CULTURE, BLOOD (ROUTINE X 2)  RESP PANEL BY RT-PCR (FLU A&B, COVID) [id number]  SURGICAL PCR SCREEN  AEROBIC/ANAEROBIC CULTURE W GRAM STAIN (SURGICAL/DEEP WOUND)  AEROBIC/ANAEROBIC CULTURE W GRAM STAIN (SURGICAL/DEEP WOUND)  AEROBIC/ANAEROBIC CULTURE W GRAM STAIN (SURGICAL/DEEP WOUND)  AEROBIC/ANAEROBIC CULTURE W GRAM STAIN (SURGICAL/DEEP WOUND)  CBC WITH DIFFERENTIAL/PLATELET  BASIC METABOLIC PANEL    EKG None  Radiology MR TIBIA FIBULA RIGHT WO  CONTRAST  Result Date: 01/26/2022 CLINICAL DATA:  Soft tissue infection. Patient has been on IV antibiotics. Evaluate for progression. EXAM: MRI OF THE RIGHT KNEE WITHOUT CONTRAST; MRI OF LOWER RIGHT EXTREMITY WITHOUT CONTRAST TECHNIQUE: Multiplanar, multisequence MR imaging of the knee was performed. No intravenous contrast was administered. Multiplanar, multisequence MR imaging of the right lower leg was performed. No intravenous contrast was administered. COMPARISON:  10/26/2021. FINDINGS: MENISCI Medial: Severe attenuation of the posterior horn and body of the medial meniscus likely reflecting prior meniscectomy. Lateral: Intact. LIGAMENTS Cruciates: Complete ACL tear.  Intact PCL. Collaterals: Medial collateral ligament is intact. Lateral collateral ligament complex is intact. CARTILAGE Patellofemoral: Full-thickness cartilage loss of the patellar apex and medial patellar facet with subchondral reactive marrow edema. Partial-thickness cartilage loss of the lateral trochlea. Medial: Extensive full-thickness cartilage loss of the medial femorotibial compartment with severe subchondral marrow edema. Lateral: Partial-thickness cartilage loss of the lateral femorotibial compartment with mild subchondral marrow edema. JOINT: Large joint effusion with extensive synovitis. 8.8 x 3.6 x 12.8 cm complex fluid collection between the soleus muscle and gastrocnemius muscles which appears contiguous with the posterolateral joint space at the level of the musculotendinous junction of the popliteus with numerous low signal foci within the collection consistent with air. Normal Hoffa's fat-pad. No plical thickening. POPLITEAL FOSSA: Popliteus tendon is intact. No Baker's cyst. EXTENSOR MECHANISM: Intact quadriceps tendon. Intact patellar tendon. Intact lateral patellar retinaculum. Intact medial patellar retinaculum. Intact MPFL. BONES: No aggressive osseous lesion. No fracture or dislocation. Other: Soft tissue edema  circumferentially around the knee and right lower leg concerning for cellulitis. Muscle edema in the popliteus muscle, inferior aspect of the medial gastrocnemius muscle, inferior aspect of the lateral gastrocnemius muscle and the anterior compartment musculature concerning for myositis. Small prepatellar fluid collection measuring 3.4 x 2 x 0.3 cm. IMPRESSION: 1. Large joint effusion with extensive synovitis concerning for septic arthritis. 8.8 x 3.6 x 12.8 cm complex fluid collection between the soleus muscle and gastrocnemius muscles which appears contiguous with the posterolateral joint space at the level of the musculotendinous junction of the popliteus with numerous low signal foci within the collection consistent with air concerning for an abscess. Subchondral bone marrow edema centered at the medial femorotibial compartment and to lesser extent lateral femorotibial compartment concerning for developing osteomyelitis versus reactive marrow edema from surgery and chondral loss. 2. Muscle edema in the popliteus muscle, inferior aspect of the medial gastrocnemius muscle, inferior aspect of the lateral gastrocnemius muscle and the anterior compartment musculature concerning for myositis. 3. Generalized soft tissue edema throughout the right lower leg and around the knee concerning for cellulitis. 4. Small prepatellar fluid collection measuring 3.4 x 2 x 0.3 cm likely reflecting a postoperative seroma or bursitis. 5. Tricompartmental cartilage abnormalities as described above most severe in the medial femorotibial compartment. 6. Severe attenuation of the posterior horn and body of the medial meniscus likely reflecting prior meniscectomy. 7. Complete tear of the ACL. Electronically Signed   By: Kathreen Devoid M.D.   On: 01/26/2022 09:27   MR KNEE RIGHT WO CONTRAST  Result Date: 01/26/2022 CLINICAL DATA:  Soft tissue infection. Patient has been on IV antibiotics. Evaluate for progression. EXAM: MRI OF THE RIGHT  KNEE WITHOUT CONTRAST; MRI OF LOWER RIGHT EXTREMITY WITHOUT CONTRAST TECHNIQUE: Multiplanar, multisequence MR imaging of the knee was performed. No intravenous contrast was administered. Multiplanar, multisequence MR imaging of the right lower leg was performed. No intravenous contrast was administered.  COMPARISON:  10/26/2021. FINDINGS: MENISCI Medial: Severe attenuation of the posterior horn and body of the medial meniscus likely reflecting prior meniscectomy. Lateral: Intact. LIGAMENTS Cruciates: Complete ACL tear.  Intact PCL. Collaterals: Medial collateral ligament is intact. Lateral collateral ligament complex is intact. CARTILAGE Patellofemoral: Full-thickness cartilage loss of the patellar apex and medial patellar facet with subchondral reactive marrow edema. Partial-thickness cartilage loss of the lateral trochlea. Medial: Extensive full-thickness cartilage loss of the medial femorotibial compartment with severe subchondral marrow edema. Lateral: Partial-thickness cartilage loss of the lateral femorotibial compartment with mild subchondral marrow edema. JOINT: Large joint effusion with extensive synovitis. 8.8 x 3.6 x 12.8 cm complex fluid collection between the soleus muscle and gastrocnemius muscles which appears contiguous with the posterolateral joint space at the level of the musculotendinous junction of the popliteus with numerous low signal foci within the collection consistent with air. Normal Hoffa's fat-pad. No plical thickening. POPLITEAL FOSSA: Popliteus tendon is intact. No Baker's cyst. EXTENSOR MECHANISM: Intact quadriceps tendon. Intact patellar tendon. Intact lateral patellar retinaculum. Intact medial patellar retinaculum. Intact MPFL. BONES: No aggressive osseous lesion. No fracture or dislocation. Other: Soft tissue edema circumferentially around the knee and right lower leg concerning for cellulitis. Muscle edema in the popliteus muscle, inferior aspect of the medial gastrocnemius  muscle, inferior aspect of the lateral gastrocnemius muscle and the anterior compartment musculature concerning for myositis. Small prepatellar fluid collection measuring 3.4 x 2 x 0.3 cm. IMPRESSION: 1. Large joint effusion with extensive synovitis concerning for septic arthritis. 8.8 x 3.6 x 12.8 cm complex fluid collection between the soleus muscle and gastrocnemius muscles which appears contiguous with the posterolateral joint space at the level of the musculotendinous junction of the popliteus with numerous low signal foci within the collection consistent with air concerning for an abscess. Subchondral bone marrow edema centered at the medial femorotibial compartment and to lesser extent lateral femorotibial compartment concerning for developing osteomyelitis versus reactive marrow edema from surgery and chondral loss. 2. Muscle edema in the popliteus muscle, inferior aspect of the medial gastrocnemius muscle, inferior aspect of the lateral gastrocnemius muscle and the anterior compartment musculature concerning for myositis. 3. Generalized soft tissue edema throughout the right lower leg and around the knee concerning for cellulitis. 4. Small prepatellar fluid collection measuring 3.4 x 2 x 0.3 cm likely reflecting a postoperative seroma or bursitis. 5. Tricompartmental cartilage abnormalities as described above most severe in the medial femorotibial compartment. 6. Severe attenuation of the posterior horn and body of the medial meniscus likely reflecting prior meniscectomy. 7. Complete tear of the ACL. Electronically Signed   By: Kathreen Devoid M.D.   On: 01/26/2022 09:27    Procedures Procedures    Medications Ordered in ED Medications  ceFEPIme (MAXIPIME) 2 g in sodium chloride 0.9 % 100 mL IVPB ( Intravenous Automatically Held 02/10/22 1600)  oxyCODONE (Oxy IR/ROXICODONE) immediate release tablet 5 mg ( Oral MAR Hold 01/26/22 0647)  vancomycin (VANCOREADY) IVPB 1250 mg/250 mL ( Intravenous  Automatically Held 02/03/22 1800)  albuterol (PROVENTIL) (2.5 MG/3ML) 0.083% nebulizer solution 2.5 mg ( Nebulization MAR Hold 01/26/22 0647)  atorvastatin (LIPITOR) tablet 40 mg ( Oral Automatically Held 02/03/22 1000)  buPROPion (WELLBUTRIN SR) 12 hr tablet 150 mg ( Oral Automatically Held 02/10/22 2200)  loratadine (CLARITIN) tablet 10 mg ( Oral Automatically Held 02/03/22 1000)  citalopram (CELEXA) tablet 40 mg ( Oral Automatically Held 02/03/22 1000)  cyclobenzaprine (FLEXERIL) tablet 10 mg ( Oral MAR Hold 01/26/22 0647)  fluticasone (FLONASE) 50 MCG/ACT nasal  spray 2 spray ( Each Nare MAR Hold 01/26/22 0647)  fluticasone furoate-vilanterol (BREO ELLIPTA) 200-25 MCG/ACT 1 puff ( Inhalation Automatically Held 02/03/22 0800)  gabapentin (NEURONTIN) capsule 600 mg ( Oral Automatically Held 02/10/22 2200)  ipratropium-albuterol (DUONEB) 0.5-2.5 (3) MG/3ML nebulizer solution 3 mL ( Inhalation MAR Hold 01/26/22 0647)  pantoprazole (PROTONIX) EC tablet 40 mg ( Oral Automatically Held 02/03/22 1000)  naloxegol oxalate (MOVANTIK) tablet 25 mg ( Oral Automatically Held 02/03/22 1000)  tapentadol (NUCYNTA) tablet 75 mg ( Oral MAR Hold 01/26/22 0647)  tamsulosin (FLOMAX) capsule 0.4 mg ( Oral Automatically Held 02/03/22 1000)  zolpidem (AMBIEN) tablet 10 mg ( Oral MAR Hold 01/26/22 0647)  insulin aspart (novoLOG) injection 0-20 Units ( Subcutaneous Automatically Held 02/03/22 1700)  mupirocin ointment (BACTROBAN) 2 % 1 application ( Nasal Automatically Held 01/30/22 1000)  Chlorhexidine Gluconate Cloth 2 % PADS 6 each ( Topical Automatically Held 02/03/22 1000)  sodium chloride flush (NS) 0.9 % injection 10-40 mL ( Intracatheter MAR Hold 01/26/22 0647)  lactated ringers infusion ( Intravenous Anesthesia Volume Adjustment 01/26/22 0919)  insulin aspart (novoLOG) injection 0-14 Units (0 Units Subcutaneous Not Given 01/26/22 0723)  fentaNYL (SUBLIMAZE) 100 MCG/2ML injection (has no administration in time range)  fentaNYL (SUBLIMAZE)  injection 25-50 mcg (50 mcg Intravenous Given 01/26/22 0933)  vancomycin (VANCOCIN) IVPB 1000 mg/200 mL premix (0 mg Intravenous Stopped 01/25/22 1807)  vancomycin (VANCOCIN) IVPB 1000 mg/200 mL premix (0 mg Intravenous Stopped 01/25/22 1906)  povidone-iodine 10 % swab 2 application (2 application Topical Given 01/26/22 0716)  acetaminophen (TYLENOL) tablet 1,000 mg (0 mg Oral Duplicate 12/03/56 5277)  acetaminophen (TYLENOL) tablet 1,000 mg (1,000 mg Oral Given 01/26/22 0717)  celecoxib (CELEBREX) capsule 200 mg (200 mg Oral Given 01/25/22 2239)  HYDROmorphone (DILAUDID) injection 1 mg (1 mg Intravenous Given 01/26/22 0028)  chlorhexidine (PERIDEX) 0.12 % solution 15 mL (15 mLs Mouth/Throat Given 01/26/22 0716)    Or  MEDLINE mouth rinse ( Mouth Rinse See Alternative 01/26/22 0716)    ED Course/ Medical Decision Making/ A&P Clinical Course as of 01/26/22 0936  Fri Jan 25, 2022  1546 Secure message Dr. Lorin Mercy from [facility] hospitalist who is put the patient in for admission.  Pharmacy is recommending cefepime and vancomycin.  Blood culture sent. [MB]    Clinical Course User Index [MB] Hayden Rasmussen, MD                           Medical Decision Making Amount and/or Complexity of Data Reviewed Labs: ordered.  Risk Prescription drug management. Decision regarding hospitalization.  This patient complains of increased right knee and calf pain; this involves an extensive number of treatment Options and is a complaint that carries with it a high risk of complications and morbidity. The differential includes septic arthritis, cellulitis, abscess, osteomyelitis  I ordered, reviewed and interpreted labs, which included CBC with mildly elevated white count slightly low hemoglobin, chemistries with low potassium elevated glucose, inflammatory markers elevated, blood cultures sent, COVID and flu negative I ordered medication IV antibiotics, oral pain medication and reviewed PMP when  indicated. Additional history obtained from patient's wife Previous records obtained and reviewed in epic including prior orthopedic note from today I consulted Dr. Lorin Mercy [facility] hospitalist and discussed lab and imaging findings and discussed disposition.  Cardiac monitoring reviewed, normal sinus rhythm Social determinants considered, patient with active tobacco use which may affect healing process Critical Interventions: None  After the interventions stated above,  I reevaluated the patient and found patient's pain to be improved. Admission and further testing considered, patient will need admission to the hospital for further operative intervention.  He is getting a medicine admission through [facility].  Awaiting transport team to take patient to [facility].  N.p.o. after midnight.          Final Clinical Impression(s) / ED Diagnoses Final diagnoses:  Right knee skin infection  Calf abscess  Pyogenic arthritis of right knee joint, due to unspecified organism [facility])    Rx / DC Orders ED Discharge Orders     None         Hayden Rasmussen, MD 01/26/22 [phone]

## 2022-01-25 NOTE — ED Notes (Signed)
Report given to [facility]. 

## 2022-01-26 ENCOUNTER — Inpatient Hospital Stay (HOSPITAL_COMMUNITY): Payer: Medicare Other | Admitting: Anesthesiology

## 2022-01-26 ENCOUNTER — Other Ambulatory Visit: Payer: Self-pay

## 2022-01-26 ENCOUNTER — Encounter (HOSPITAL_COMMUNITY)
Admission: EM | Disposition: A | Discharge status: Home Health | Payer: Self-pay | Source: Home / Self Care | Attending: Internal Medicine

## 2022-01-26 ENCOUNTER — Inpatient Hospital Stay (HOSPITAL_COMMUNITY): Payer: Medicare Other

## 2022-01-26 ENCOUNTER — Encounter (HOSPITAL_COMMUNITY): Payer: Self-pay | Admitting: Internal Medicine

## 2022-01-26 DIAGNOSIS — G894 Chronic pain syndrome: Secondary | ICD-10-CM

## 2022-01-26 DIAGNOSIS — E6609 Other obesity due to excess calories: Secondary | ICD-10-CM

## 2022-01-26 DIAGNOSIS — L02415 Cutaneous abscess of right lower limb: Secondary | ICD-10-CM

## 2022-01-26 DIAGNOSIS — M009 Pyogenic arthritis, unspecified: Secondary | ICD-10-CM

## 2022-01-26 DIAGNOSIS — A498 Other bacterial infections of unspecified site: Secondary | ICD-10-CM

## 2022-01-26 DIAGNOSIS — J449 Chronic obstructive pulmonary disease, unspecified: Secondary | ICD-10-CM

## 2022-01-26 DIAGNOSIS — K5903 Drug induced constipation: Secondary | ICD-10-CM

## 2022-01-26 DIAGNOSIS — Z6833 Body mass index (BMI) 33.0-33.9, adult: Secondary | ICD-10-CM

## 2022-01-26 DIAGNOSIS — F17210 Nicotine dependence, cigarettes, uncomplicated: Secondary | ICD-10-CM | POA: Diagnosis not present

## 2022-01-26 DIAGNOSIS — E1165 Type 2 diabetes mellitus with hyperglycemia: Secondary | ICD-10-CM

## 2022-01-26 DIAGNOSIS — F419 Anxiety disorder, unspecified: Secondary | ICD-10-CM | POA: Diagnosis not present

## 2022-01-26 DIAGNOSIS — T8140XA Infection following a procedure, unspecified, initial encounter: Secondary | ICD-10-CM

## 2022-01-26 DIAGNOSIS — Z794 Long term (current) use of insulin: Secondary | ICD-10-CM

## 2022-01-26 DIAGNOSIS — G4733 Obstructive sleep apnea (adult) (pediatric): Secondary | ICD-10-CM

## 2022-01-26 DIAGNOSIS — E785 Hyperlipidemia, unspecified: Secondary | ICD-10-CM

## 2022-01-26 DIAGNOSIS — F32A Depression, unspecified: Secondary | ICD-10-CM

## 2022-01-26 DIAGNOSIS — T402X5A Adverse effect of other opioids, initial encounter: Secondary | ICD-10-CM

## 2022-01-26 HISTORY — PX: INCISION AND DRAINAGE ABSCESS: SHX5864

## 2022-01-26 LAB — CBC WITH DIFFERENTIAL/PLATELET
Abs Immature Granulocytes: 0.04 10*3/uL (ref 0.00–0.07)
Basophils Absolute: 0.1 10*3/uL (ref 0.0–0.1)
Basophils Relative: 1 %
Eosinophils Absolute: 0.1 10*3/uL (ref 0.0–0.5)
Eosinophils Relative: 1 %
HCT: 36.4 % — ABNORMAL LOW (ref 39.0–52.0)
Hemoglobin: 11.8 g/dL — ABNORMAL LOW (ref 13.0–17.0)
Immature Granulocytes: 0 %
Lymphocytes Relative: 9 %
Lymphs Abs: 1 10*3/uL (ref 0.7–4.0)
MCH: 28 pg (ref 26.0–34.0)
MCHC: 32.4 g/dL (ref 30.0–36.0)
MCV: 86.5 fL (ref 80.0–100.0)
Monocytes Absolute: 0.3 10*3/uL (ref 0.1–1.0)
Monocytes Relative: 2 %
Neutro Abs: 9.1 10*3/uL — ABNORMAL HIGH (ref 1.7–7.7)
Neutrophils Relative %: 87 %
Platelets: 291 10*3/uL (ref 150–400)
RBC: 4.21 MIL/uL — ABNORMAL LOW (ref 4.22–5.81)
RDW: 14 % (ref 11.5–15.5)
WBC: 10.5 10*3/uL (ref 4.0–10.5)
nRBC: 0 % (ref 0.0–0.2)

## 2022-01-26 LAB — HEMOGLOBIN A1C
Hgb A1c MFr Bld: 7.5 % — ABNORMAL HIGH (ref 4.8–5.6)
Mean Plasma Glucose: 168.55 mg/dL

## 2022-01-26 LAB — GLUCOSE, CAPILLARY
Glucose-Capillary: 121 mg/dL — ABNORMAL HIGH (ref 70–99)
Glucose-Capillary: 159 mg/dL — ABNORMAL HIGH (ref 70–99)
Glucose-Capillary: 242 mg/dL — ABNORMAL HIGH (ref 70–99)
Glucose-Capillary: 196 mg/dL — ABNORMAL HIGH (ref 70–99)
Glucose-Capillary: 291 mg/dL — ABNORMAL HIGH (ref 70–99)
Glucose-Capillary: 397 mg/dL — ABNORMAL HIGH (ref 70–99)

## 2022-01-26 LAB — BASIC METABOLIC PANEL
Anion gap: 9 (ref 5–15)
BUN: 6 mg/dL (ref 6–20)
CO2: 27 mmol/L (ref 22–32)
Calcium: 8.8 mg/dL — ABNORMAL LOW (ref 8.9–10.3)
Chloride: 101 mmol/L (ref 98–111)
Creatinine, Ser: 0.66 mg/dL (ref 0.61–1.24)
GFR, Estimated: >60 mL/min (ref >60)
Glucose, Bld: 211 mg/dL — ABNORMAL HIGH (ref 70–99)
Potassium: 4 mmol/L (ref 3.5–5.1)
Sodium: 137 mmol/L (ref 135–145)

## 2022-01-26 LAB — SURGICAL PCR SCREEN
MRSA, PCR: NEGATIVE
Staphylococcus aureus: NEGATIVE

## 2022-01-26 SURGERY — INCISION AND DRAINAGE, ABSCESS
Anesthesia: General | Site: Leg Lower | Laterality: Right

## 2022-01-26 MED ORDER — FENTANYL CITRATE (PF) 100 MCG/2ML IJ SOLN
INTRAMUSCULAR | Status: AC
  Filled 2022-01-26: qty 2

## 2022-01-26 MED ORDER — FENTANYL CITRATE (PF) 250 MCG/5ML IJ SOLN
INTRAMUSCULAR | Status: AC
  Filled 2022-01-26: qty 5

## 2022-01-26 MED ORDER — LIDOCAINE 2% (20 MG/ML) 5 ML SYRINGE
INTRAMUSCULAR | Status: AC
  Filled 2022-01-26: qty 5

## 2022-01-26 MED ORDER — MIDAZOLAM HCL 5 MG/5ML IJ SOLN
INTRAMUSCULAR | Status: DC | PRN
  Administered 2022-01-26: 2 mg via INTRAVENOUS

## 2022-01-26 MED ORDER — LIVING WELL WITH DIABETES BOOK
Freq: Once | Status: AC
  Filled 2022-01-26: qty 1

## 2022-01-26 MED ORDER — BUPIVACAINE HCL (PF) 0.25 % IJ SOLN
INTRAMUSCULAR | Status: AC
  Filled 2022-01-26: qty 30

## 2022-01-26 MED ORDER — ONDANSETRON HCL 4 MG/2ML IJ SOLN
INTRAMUSCULAR | Status: AC
  Filled 2022-01-26: qty 2

## 2022-01-26 MED ORDER — PROPOFOL 10 MG/ML IV BOLUS
INTRAVENOUS | Status: AC
  Filled 2022-01-26: qty 20

## 2022-01-26 MED ORDER — ORAL CARE MOUTH RINSE: 15.0000 mL | Freq: Once | OROMUCOSAL | Status: AC

## 2022-01-26 MED ORDER — DEXAMETHASONE SODIUM PHOSPHATE 10 MG/ML IJ SOLN
INTRAMUSCULAR | Status: AC
  Filled 2022-01-26: qty 1

## 2022-01-26 MED ORDER — CHLORHEXIDINE GLUCONATE 0.12 % MT SOLN: 15.0000 mL | Freq: Once | OROMUCOSAL | Status: AC

## 2022-01-26 MED ORDER — 0.9 % SODIUM CHLORIDE (POUR BTL) OPTIME
TOPICAL | Status: DC | PRN
Start: 2022-01-26 — End: 2022-01-26
  Administered 2022-01-26: 1000 mL

## 2022-01-26 MED ORDER — ASPIRIN 81 MG PO CHEW
81.0000 mg | CHEWABLE_TABLET | Freq: Two times a day (BID) | ORAL | Status: DC
Start: 2022-01-26 — End: 2022-01-28
  Administered 2022-01-26 – 2022-01-28 (×5): 81 mg via ORAL
  Filled 2022-01-26 (×5): qty 1

## 2022-01-26 MED ORDER — RIVAROXABAN 10 MG PO TABS
10.0000 mg | ORAL_TABLET | Freq: Every day | ORAL | Status: DC
  Administered 2022-01-27 – 2022-01-28 (×2): 10 mg via ORAL
  Filled 2022-01-26 (×2): qty 1

## 2022-01-26 MED ORDER — CHLORHEXIDINE GLUCONATE 0.12 % MT SOLN
OROMUCOSAL | Status: AC
  Administered 2022-01-26: 15 mL via OROMUCOSAL
  Filled 2022-01-26: qty 15

## 2022-01-26 MED ORDER — HYDROMORPHONE HCL 1 MG/ML IJ SOLN
1.0000 mg | INTRAMUSCULAR | Status: DC | PRN
  Administered 2022-01-26 – 2022-01-30 (×13): 1 mg via INTRAVENOUS
  Filled 2022-01-26 (×13): qty 1

## 2022-01-26 MED ORDER — CHLORHEXIDINE GLUCONATE CLOTH 2 % EX PADS
6.0000 | MEDICATED_PAD | Freq: Every day | CUTANEOUS | Status: DC
  Administered 2022-01-26 – 2022-01-30 (×5): 6 via TOPICAL

## 2022-01-26 MED ORDER — NICOTINE 21 MG/24HR TD PT24
21.0000 mg | MEDICATED_PATCH | Freq: Every day | TRANSDERMAL | Status: DC
Start: 2022-01-26 — End: 2022-01-30
  Administered 2022-01-26 – 2022-01-28 (×3): 21 mg via TRANSDERMAL
  Filled 2022-01-26 (×5): qty 1

## 2022-01-26 MED ORDER — DEXAMETHASONE SODIUM PHOSPHATE 10 MG/ML IJ SOLN
INTRAMUSCULAR | Status: DC | PRN
  Administered 2022-01-26: 4 mg via INTRAVENOUS

## 2022-01-26 MED ORDER — LACTATED RINGERS IV SOLN: INTRAVENOUS | Status: DC

## 2022-01-26 MED ORDER — LIDOCAINE 2% (20 MG/ML) 5 ML SYRINGE
INTRAMUSCULAR | Status: DC | PRN
Start: 2022-01-26 — End: 2022-01-26
  Administered 2022-01-26: 100 mg via INTRAVENOUS

## 2022-01-26 MED ORDER — FENTANYL CITRATE (PF) 100 MCG/2ML IJ SOLN
25.0000 ug | INTRAMUSCULAR | Status: DC | PRN
  Administered 2022-01-26 (×3): 50 ug via INTRAVENOUS

## 2022-01-26 MED ORDER — INSULIN GLARGINE-YFGN 100 UNIT/ML ~~LOC~~ SOLN
25.0000 [IU] | Freq: Every day | SUBCUTANEOUS | Status: DC
  Administered 2022-01-26 – 2022-01-27 (×2): 25 [IU] via SUBCUTANEOUS
  Filled 2022-01-26 (×3): qty 0.25

## 2022-01-26 MED ORDER — SODIUM CHLORIDE 0.9% FLUSH
10.0000 mL | INTRAVENOUS | Status: DC | PRN
  Administered 2022-01-30: 10 mL

## 2022-01-26 MED ORDER — VANCOMYCIN HCL 1000 MG IV SOLR
INTRAVENOUS | Status: DC | PRN
  Administered 2022-01-26: 1000 mg

## 2022-01-26 MED ORDER — OXYCODONE HCL 5 MG PO TABS
5.0000 mg | ORAL_TABLET | ORAL | Status: DC | PRN
  Administered 2022-01-26 – 2022-01-30 (×14): 10 mg via ORAL
  Filled 2022-01-26 (×14): qty 2

## 2022-01-26 MED ORDER — ONDANSETRON HCL 4 MG/2ML IJ SOLN
INTRAMUSCULAR | Status: DC | PRN
  Administered 2022-01-26: 4 mg via INTRAVENOUS

## 2022-01-26 MED ORDER — PROPOFOL 10 MG/ML IV BOLUS
INTRAVENOUS | Status: DC | PRN
  Administered 2022-01-26: 200 mg via INTRAVENOUS

## 2022-01-26 MED ORDER — KETOROLAC TROMETHAMINE 30 MG/ML IJ SOLN: 30.0000 mg | Freq: Four times a day (QID) | INTRAMUSCULAR | Status: DC

## 2022-01-26 MED ORDER — KETOROLAC TROMETHAMINE 30 MG/ML IJ SOLN
30.0000 mg | Freq: Four times a day (QID) | INTRAMUSCULAR | Status: AC
  Administered 2022-01-26 – 2022-01-27 (×5): 30 mg via INTRAVENOUS
  Filled 2022-01-26 (×5): qty 1

## 2022-01-26 MED ORDER — VANCOMYCIN HCL 1000 MG IV SOLR
INTRAVENOUS | Status: AC
  Filled 2022-01-26: qty 20

## 2022-01-26 MED ORDER — FENTANYL CITRATE (PF) 250 MCG/5ML IJ SOLN
INTRAMUSCULAR | Status: DC | PRN
  Administered 2022-01-26: 50 ug via INTRAVENOUS
  Administered 2022-01-26 (×2): 25 ug via INTRAVENOUS
  Administered 2022-01-26 (×3): 50 ug via INTRAVENOUS

## 2022-01-26 MED ORDER — INSULIN ASPART 100 UNIT/ML IJ SOLN
0.0000 [IU] | Freq: Every day | INTRAMUSCULAR | Status: AC
  Administered 2022-01-26: 5 [IU] via SUBCUTANEOUS

## 2022-01-26 MED ORDER — OXYCODONE HCL 5 MG PO TABS
ORAL_TABLET | ORAL | Status: AC
  Filled 2022-01-26: qty 1

## 2022-01-26 MED ORDER — INSULIN ASPART 100 UNIT/ML IJ SOLN: 0.0000 [IU] | INTRAMUSCULAR | Status: DC | PRN

## 2022-01-26 MED ORDER — SODIUM CHLORIDE 0.9 % IR SOLN
Status: DC | PRN
Start: 2022-01-26 — End: 2022-01-26
  Administered 2022-01-26: 12000 mL

## 2022-01-26 MED ORDER — MIDAZOLAM HCL 2 MG/2ML IJ SOLN
INTRAMUSCULAR | Status: AC
  Filled 2022-01-26: qty 2

## 2022-01-26 SURGICAL SUPPLY — 75 items
BAG COUNTER SPONGE SURGICOUNT (BAG) ×3 IMPLANT
BAG SPNG CNTER NS LX DISP (BAG) ×1
BANDAGE ESMARK 6X9 LF (GAUZE/BANDAGES/DRESSINGS) IMPLANT
BLADE SURG 10 STRL SS (BLADE) ×2 IMPLANT
BNDG CMPR 9X4 STRL LF SNTH (GAUZE/BANDAGES/DRESSINGS)
BNDG CMPR 9X6 STRL LF SNTH (GAUZE/BANDAGES/DRESSINGS)
BNDG CMPR MED 15X6 ELC VLCR LF (GAUZE/BANDAGES/DRESSINGS) ×1
BNDG COHESIVE 4X5 TAN STRL (GAUZE/BANDAGES/DRESSINGS) ×3 IMPLANT
BNDG ELASTIC 4X5.8 VLCR STR LF (GAUZE/BANDAGES/DRESSINGS) ×2 IMPLANT
BNDG ELASTIC 6X15 VLCR STRL LF (GAUZE/BANDAGES/DRESSINGS) ×1 IMPLANT
BNDG ELASTIC 6X5.8 VLCR STR LF (GAUZE/BANDAGES/DRESSINGS) ×2 IMPLANT
BNDG ESMARK 4X9 LF (GAUZE/BANDAGES/DRESSINGS) IMPLANT
BNDG ESMARK 6X9 LF (GAUZE/BANDAGES/DRESSINGS)
BNDG GAUZE ELAST 4 BULKY (GAUZE/BANDAGES/DRESSINGS) ×2 IMPLANT
CNTNR URN SCR LID CUP LEK RST (MISCELLANEOUS) IMPLANT
CONT SPEC 4OZ STRL OR WHT (MISCELLANEOUS)
COVER SURGICAL LIGHT HANDLE (MISCELLANEOUS) ×3 IMPLANT
CUFF TOURN SGL LL 12 NO SLV (MISCELLANEOUS) IMPLANT
CUFF TOURN SGL QUICK 34 (TOURNIQUET CUFF)
CUFF TRNQT CYL 34X4.125X (TOURNIQUET CUFF) IMPLANT
DRAPE SURG 17X23 STRL (DRAPES) IMPLANT
DRAPE U-SHAPE 47X51 STRL (DRAPES) IMPLANT
DRSG ADAPTIC 3X8 NADH LF (GAUZE/BANDAGES/DRESSINGS) ×1 IMPLANT
DRSG PAD ABDOMINAL 8X10 ST (GAUZE/BANDAGES/DRESSINGS) ×2 IMPLANT
DRSG TEGADERM 4X4.75 (GAUZE/BANDAGES/DRESSINGS) ×2 IMPLANT
DURAPREP 26ML APPLICATOR (WOUND CARE) ×4 IMPLANT
ELECT REM PT RETURN 9FT ADLT (ELECTROSURGICAL)
ELECTRODE REM PT RTRN 9FT ADLT (ELECTROSURGICAL) IMPLANT
EVACUATOR 1/8 PVC DRAIN (DRAIN) ×1 IMPLANT
EVACUATOR 3/16  PVC DRAIN (DRAIN) ×2
EVACUATOR 3/16 PVC DRAIN (DRAIN) IMPLANT
FACESHIELD WRAPAROUND (MASK) IMPLANT
FACESHIELD WRAPAROUND OR TEAM (MASK) ×2 IMPLANT
GAUZE SPONGE 4X4 12PLY STRL (GAUZE/BANDAGES/DRESSINGS) ×3 IMPLANT
GAUZE XEROFORM 1X8 LF (GAUZE/BANDAGES/DRESSINGS) ×2 IMPLANT
GLOVE SRG 8 PF TXTR STRL LF DI (GLOVE) ×2 IMPLANT
GLOVE SURG ENC MOIS LTX SZ7.5 (GLOVE) ×3 IMPLANT
GLOVE SURG SYN 7.5  E (GLOVE) ×2
GLOVE SURG SYN 7.5 E (GLOVE) ×1 IMPLANT
GLOVE SURG SYN 7.5 PF PI (GLOVE) ×2 IMPLANT
GLOVE SURG UNDER POLY LF SZ7.5 (GLOVE) ×3 IMPLANT
GLOVE SURG UNDER POLY LF SZ8 (GLOVE) ×2
GOWN STRL REUS W/ TWL LRG LVL3 (GOWN DISPOSABLE) ×4 IMPLANT
GOWN STRL REUS W/ TWL XL LVL3 (GOWN DISPOSABLE) ×4 IMPLANT
GOWN STRL REUS W/TWL LRG LVL3 (GOWN DISPOSABLE) ×4
GOWN STRL REUS W/TWL XL LVL3 (GOWN DISPOSABLE) ×4
HANDPIECE INTERPULSE COAX TIP (DISPOSABLE)
KIT BASIN OR (CUSTOM PROCEDURE TRAY) ×3 IMPLANT
KIT TURNOVER KIT B (KITS) ×3 IMPLANT
MANIFOLD NEPTUNE II (INSTRUMENTS) ×3 IMPLANT
NDL HYPO 25GX1X1/2 BEV (NEEDLE) IMPLANT
NEEDLE HYPO 25GX1X1/2 BEV (NEEDLE) IMPLANT
NS IRRIG 1000ML POUR BTL (IV SOLUTION) ×3 IMPLANT
PACK ORTHO EXTREMITY (CUSTOM PROCEDURE TRAY) ×3 IMPLANT
PAD ABD 8X10 STRL (GAUZE/BANDAGES/DRESSINGS) ×2 IMPLANT
PAD ARMBOARD 7.5X6 YLW CONV (MISCELLANEOUS) ×6 IMPLANT
PADDING CAST ABS 4INX4YD NS (CAST SUPPLIES) ×1
PADDING CAST ABS 6INX4YD NS (CAST SUPPLIES) ×1
PADDING CAST ABS COTTON 4X4 ST (CAST SUPPLIES) IMPLANT
PADDING CAST ABS COTTON 6X4 NS (CAST SUPPLIES) IMPLANT
SET CYSTO W/LG BORE CLAMP LF (SET/KITS/TRAYS/PACK) IMPLANT
SET HNDPC FAN SPRY TIP SCT (DISPOSABLE) IMPLANT
SET IRRIG Y TYPE TUR BLADDER L (SET/KITS/TRAYS/PACK) ×1 IMPLANT
SPONGE T-LAP 18X18 ~~LOC~~+RFID (SPONGE) IMPLANT
STOCKINETTE IMPERVIOUS 9X36 MD (GAUZE/BANDAGES/DRESSINGS) ×3 IMPLANT
SUT ETHILON 3 0 PS 1 (SUTURE) ×1 IMPLANT
SUT PDS AB 0 CT1 27 (SUTURE) ×1 IMPLANT
SUT PDS AB 2-0 CT1 27 (SUTURE) ×1 IMPLANT
SWAB CULTURE ESWAB REG 1ML (MISCELLANEOUS) IMPLANT
SYR CONTROL 10ML LL (SYRINGE) IMPLANT
TOWEL GREEN STERILE (TOWEL DISPOSABLE) ×3 IMPLANT
TOWEL GREEN STERILE FF (TOWEL DISPOSABLE) ×3 IMPLANT
TUBE CONNECTING 12X1/4 (SUCTIONS) ×3 IMPLANT
UNDERPAD 30X36 HEAVY ABSORB (UNDERPADS AND DIAPERS) ×3 IMPLANT
YANKAUER SUCT BULB TIP NO VENT (SUCTIONS) ×3 IMPLANT

## 2022-01-26 NOTE — Plan of Care (Signed)
OR today. VSS. C/o pain, given PRN meds. Drsg intact. X2 hemovac drains. Voiding. Call bell within reach. Family at bedside.   Problem: Clinical Measurements: Goal: Ability to maintain clinical measurements within normal limits will improve Outcome: Progressing Goal: Will remain free from infection Outcome: Progressing Goal: Diagnostic test results will improve Outcome: Progressing Goal: Respiratory complications will improve Outcome: Progressing Goal: Cardiovascular complication will be avoided Outcome: Progressing   Problem: Pain Managment: Goal: General experience of comfort will improve Outcome: Progressing   Problem: Safety: Goal: Ability to remain free from injury will improve Outcome: Progressing   Problem: Skin Integrity: Goal: Risk for impaired skin integrity will decrease Outcome: Progressing   Problem: Elimination: Goal: Will not experience complications related to bowel motility Outcome: Progressing Goal: Will not experience complications related to urinary retention Outcome: Progressing   Problem: Nutrition: Goal: Adequate nutrition will be maintained Outcome: Progressing   Problem: Activity: Goal: Risk for activity intolerance will decrease Outcome: Progressing

## 2022-01-26 NOTE — Assessment & Plan Note (Signed)
-  Continue Movantik

## 2022-01-26 NOTE — Anesthesia Postprocedure Evaluation (Signed)
Anesthesia Post Note  Patient: Joseph [name]  Procedure(s) Performed: INCISION AND DRAINAGE ABSCESS POST OP RIGHT KNEE ARTHROSCOPY (Right: Leg Lower)     Patient location during evaluation: PACU Anesthesia Type: General Level of consciousness: awake and alert Pain management: pain level controlled Vital Signs Assessment: post-procedure vital signs reviewed and stable Respiratory status: spontaneous breathing, nonlabored ventilation, respiratory function stable and patient connected to nasal cannula oxygen Cardiovascular status: blood pressure returned to baseline and stable Postop Assessment: no apparent nausea or vomiting Anesthetic complications: no   No notable events documented.  Last Vitals:  Vitals:   01/26/22 1445 01/26/22 1605  BP: 114/66 120/78  Pulse: 95 96  Resp:  16  Temp: 36.8 C 36.9 C  SpO2: 94% 93%    Last Pain:  Vitals:   01/26/22 1639  TempSrc:   PainSc: 7                  Anvitha Hutmacher P Charo Philipp

## 2022-01-26 NOTE — Assessment & Plan Note (Signed)
-  I have reviewed this patient in the [place] Controlled Substances Reporting System.  He is receiving medications from multiple providers. -He is at particularly high risk of opioid misuse, diversion, or overdose. -Additionally, he is unlikely to get effective pain control given his pain tolerance, needs to wean as an outpatient if possible -For now, continue Neurontin, Nucynta, Oxy IR

## 2022-01-26 NOTE — H&P (Addendum)
History and Physical    Patient: Joseph [name] VZD:[id number] DOB: 04/25/1971 DOA: 01/25/2022 DOS: the patient was seen and examined on 01/26/2022 PCP: Pleas Koch, NP  Patient coming from: Home - lives with ; [facility]:    Chief Complaint: Knee infection  HPI: Joseph [name] is a 51 y.o. male with medical history significant of COPD; DM; chronic diastolic CHF; and chronic pain presenting with a septic knee. He underwent R meniscectomy with Dr. Percell Miller on 1/19 and then developed infection in the knee about a week later.   He had R knee I&D with Dr. Ephraim Hamburger on 2/1 with cultures growing Serratia Marcescens that was resistant to Cefazolin.   He had a PICC line placed and has been receiving infusions of Rocephin with plan for a 6 week course, as per Dr. Lucianne Lei Dam's clinic note on 2/10.  He developed a DVT and was started on Xarelto and was seen by Dr. Stanford Breed on 2/21.  He has continued to have infection and was seen in the orthopedics office yesterday with concern for pain and swelling in the R knee and calf.  US-guided aspiration of the calf was performed with 10cc of frank pus removed.  He was scheduled for repeat knee and calf I&D in the OR.  He went to [facility] and was seen there and started on IV antibiotics at that time.  He is currently post-op and is having significant pain.  He has chronic pain and is on chronic opiates.    ER Course:  Swollen/tender R knee and R upper calf.  PICC line without signs of infection.  MRI ordered by orthopedics.  Needs admission for surgery in AM.     Review of Systems: As mentioned in the history of present illness. All other systems reviewed and are negative. Past Medical History:  Diagnosis Date   Acute encephalopathy 03/24/2018   Anxiety    Arthritis    oa left knee and lower back   Asthma    Cataract    hx of bilateral   CHF (congestive heart failure) (HCC)    Chronic back pain    Chronic pain of left knee    COPD (chronic obstructive pulmonary  disease) ([place])    Diabetes mellitus    type 2   DVT (deep venous thrombosis) ([place]) 01/09/2022   GERD (gastroesophageal reflux disease)    INGUINAL PAIN, LEFT 10/03/2009   Qualifier: Diagnosis of  By: Oneida Alar MD, Sandi L    Oral thrush 11/10/2018   Overdose    03-24-18 accidental   Rash and nonspecific skin eruption 03/12/2017   Septic arthritis ([place]) 01/09/2022   Shortness of breath    with heavy exertion   Sleep apnea    Past Surgical History:  Procedure Laterality Date   CARPAL TUNNEL RELEASE Left    CATARACT EXTRACTION W/PHACO Right 03/11/2016   Procedure: CATARACT EXTRACTION PHACO AND INTRAOCULAR LENS PLACEMENT ([place]);  Surgeon: Tonny Branch, MD;  Location: [facility];  Service: Ophthalmology;  Laterality: Right;  CDE 4.24   EYE SURGERY Right 2018   ioc with lens replacement   HERNIA REPAIR Bilateral 2951   umbilical   INCISION AND DRAINAGE ABSCESS Right 01/02/2022   Procedure: RIGHT KNEE ARTHROSCOPIC IRRIGATION AND DEBRIDEMENT;  Surgeon: Willaim Sheng, MD;  Location: [facility];  Service: Orthopedics;  Laterality: Right;   KNEE ARTHROSCOPY WITH MEDIAL MENISECTOMY Left 04/22/2014   Procedure: LEFT KNEE ARTHROSCOPY WITH MEDIAL MENISECTOMY;  Surgeon: Carole Civil, MD;  Location: [facility];  Service: Orthopedics;  Laterality: Left;   KNEE ARTHROSCOPY WITH MEDIAL MENISECTOMY Right 12/06/2015   Procedure: RIGHT KNEE ARTHROSCOPY WITH MEDIAL MENISECTOMY;  Surgeon: Carole Civil, MD;  Location: [facility];  Service: Orthopedics;  Laterality: Right;   KNEE ARTHROSCOPY WITH MEDIAL MENISECTOMY Left 03/20/2017   Procedure: KNEE ARTHROSCOPY WITH MEDIAL MENISECTOMY;  Surgeon: Carole Civil, MD;  Location: [facility];  Service: Orthopedics;  Laterality: Left;   ORIF WRIST FRACTURE Left 12/19/2016   Procedure: OPEN REDUCTION INTERNAL FIXATION (ORIF) WRIST FRACTURE;  Surgeon: Leanora Cover, MD;  Location: [facility];  Service: Orthopedics;  Laterality: Left;  accumed     PARTIAL KNEE ARTHROPLASTY Left 07/21/2018   Procedure: LEFT UNICOMPARTMENTAL KNEE;  Surgeon: Renette Butters, MD;  Location: [facility];  Service: Orthopedics;  Laterality: Left;   SHOULDER ARTHROCENTESIS Right 2008 or 2010   VASECTOMY  2014   Social History:  reports that he has been smoking cigarettes. He started smoking about 28 years ago. He has a 32.50 pack-year smoking history. He has never used smokeless tobacco. He reports that he does not drink alcohol and does not use drugs.  Allergies  Allergen Reactions   Azithromycin Hives   Clarithromycin    Erythromycin Hives   Keflex [Cephalexin] Nausea Only   Metformin Nausea And Vomiting   Symbicort [Budesonide-Formoterol Fumarate] Other (See Comments)    States that 3 doses were used and breathing became worse    Family History  Problem Relation Age of Onset   Emphysema Maternal Grandfather    Emphysema Maternal Grandmother    Clotting disorder Maternal Grandmother     Prior to Admission medications   Medication Sig Start Date End Date Taking? Authorizing Provider  albuterol (PROVENTIL) (2.5 MG/3ML) 0.083% nebulizer solution USE 3 ML(1 VIAL) VIA NEBULIZER FOUR TIMES DAILY AS NEEDED FOR SHORTNESS OF BREATH 12/07/21   Tanda Rockers, MD  ANDROGEL PUMP 20.25 MG/ACT (1.62%) GEL Apply 2 Pump topically daily.  02/25/18   [provider]  aspirin EC 81 MG tablet Take 1 tablet (81 mg total) by mouth 2 (two) times daily for 28 days. Swallow whole. Patient not taking: Reported on 01/22/2022 01/03/22 01/31/22  Willaim Sheng, MD  atorvastatin (LIPITOR) 40 MG tablet TAKE ONE TABLET (40MG TOTAL) BY MOUTH DAILY FOR CHOLESTEROL 03/27/21   Pleas Koch, NP  benzonatate (TESSALON) 200 MG capsule Take 1 capsule (200 mg total) by mouth 3 (three) times daily as needed for cough. 11/20/21   Pleas Koch, NP  blood glucose meter kit and supplies KIT Dispense based on patient and insurance preference. Use up to four times daily as  directed. (FOR ICD-9 250.00, 250.01). 06/28/19   Pleas Koch, NP  buPROPion [facility] SR) 150 MG 12 hr tablet Take 1 tablet (150 mg total) by mouth 2 (two) times daily. For anxiety and depression. 10/23/21   Pleas Koch, NP  cefTRIAXone (ROCEPHIN) 10 g injection  01/15/22   [provider]  cetirizine (ZYRTEC) 10 MG tablet Take 1 tablet by mouth at bedtime for allergies. 12/24/21   Pleas Koch, NP  ciprofloxacin (CIPRO) 500 MG tablet Take 500 mg by mouth 2 (two) times daily. 01/02/22   [provider]  citalopram (CELEXA) 40 MG tablet Take 1 tablet (40 mg total) by mouth daily. For anxiety and depression. 10/23/21   Pleas Koch, NP  Continuous Blood Gluc Sensor (FREESTYLE LIBRE 3 SENSOR) MISC 1 Units by Does not  apply route 4 (four) times daily. Place 1 sensor on the skin every 14 days. Use to check glucose continuously 10/23/21   Pleas Koch, NP  cyclobenzaprine (FLEXERIL) 10 MG tablet Take 10 mg by mouth 2 (two) times daily as needed.    [provider]  doxycycline (VIBRA-TABS) 100 MG tablet Take 1 tablet (100 mg total) by mouth 2 (two) times daily. 12/18/21   Tanda Rockers, MD  empagliflozin (JARDIANCE) 10 MG TABS tablet Take 1 tablet (10 mg total) by mouth daily. For diabetes. 10/23/21   Pleas Koch, NP  fluticasone (FLONASE) 50 MCG/ACT nasal spray Place 2 sprays into both nostrils daily as needed for allergies or rhinitis. 10/23/21   Pleas Koch, NP  fluticasone furoate-vilanterol (BREO ELLIPTA) 200-25 MCG/INH AEPB Inhale 1 puff into the lungs daily. 06/13/21   Tanda Rockers, MD  gabapentin (NEURONTIN) 300 MG capsule Take 2 capsules (600 mg total) by mouth 2 (two) times daily. For pain. 12/24/21   Pleas Koch, NP  glipiZIDE (GLUCOTROL) 10 MG tablet Take 1 tablet (10 mg total) by mouth 2 (two) times daily before a meal. For diabetes. 01/04/22   Pleas Koch, NP  insulin degludec (TRESIBA) 100 UNIT/ML FlexTouch Pen  Inject 35 Units into the skin daily. For diabetes. 10/23/21   Pleas Koch, NP  Insulin Pen Needle (UNIFINE PENTIPS) 31G X 6 MM MISC USE NIGHTLY WITH INSULIN 06/26/21   Pleas Koch, NP  Ipratropium-Albuterol (COMBIVENT RESPIMAT) 20-100 MCG/ACT AERS respimat INHALE ONE PUFF INTO THE LUNGS EVERY FOUR HOURS AS NEEDED FOR WHEEZING 09/17/21   Tanda Rockers, MD  Lancets (ONETOUCH DELICA PLUS [id number]) MISC USE TO CHECK BLOOD SUGAR UP TO FOUR TIMES DAILY 09/14/19   Pleas Koch, NP  lansoprazole (PREVACID) 30 MG capsule Take 1 capsule (30 mg total) by mouth 2 (two) times daily before a meal. For heartburn. 11/06/21   Pleas Koch, NP  metFORMIN (GLUCOPHAGE-XR) 500 MG 24 hr tablet Take 2 tablets (1,000 mg total) by mouth 2 (two) times daily with a meal. For diabetes. 01/03/22   Michela Pitcher, NP  MOVANTIK 25 MG TABS tablet Take 25 mg by mouth every morning. 09/13/21   [provider]  NUCYNTA 75 MG tablet Take 75 mg by mouth 3 (three) times daily as needed. 10/22/21   [provider]  ONETOUCH VERIO test strip USE TO CHECK BLOOD SUGAR UP TO FOUR TIMES DAILY 09/14/19   Pleas Koch, NP  oxyCODONE (OXY IR/ROXICODONE) 5 MG immediate release tablet Take 5 mg by mouth every 6 (six) hours as needed. 01/21/22   [provider]  rivaroxaban (XARELTO) 20 MG TABS tablet Take 1 tablet (20 mg total) by mouth daily with supper. 01/22/22 04/22/22  Cherre Robins, MD  tamsulosin (FLOMAX) 0.4 MG CAPS capsule TAKE ONE CAPSULE BY MOUTH DAILY 07/21/19   Pleas Koch, NP  Turmeric 500 MG CAPS Take 1 capsule by mouth daily.    [provider]  zolpidem (AMBIEN) 10 MG tablet TAKE ONE TABLET (10MG TOTAL) BY MOUTH ATBEDTIME AS NEEDED FOR SLEEP 01/23/22   Pleas Koch, NP    Physical Exam: Vitals:   01/26/22 1000 01/26/22 1015 01/26/22 1040 01/26/22 1131  BP: 140/82 (!) 141/82 140/89   Pulse: 90 89 89   Resp: _0 Temp:   98.5 F (36.9 C)    TempSrc:   Oral   SpO2: 95% 96% 96% 97%  General:  Appears uncomfortable due to pain Eyes:  EOMI, normal lids, iris ENT:  grossly normal hearing, lips & tongue, mmm; poor dentition Neck:  no LAD, masses or thyromegaly Cardiovascular:  RRR, no m/r/g. No LE edema.  Respiratory:   CTA bilaterally with no wheezes/rales/rhonchi.  Normal respiratory effort. Abdomen:  soft, NT, ND Skin:  no rash or induration seen on limited exam Musculoskeletal:  his entire R leg is wrapped in bandage that is C/D/I; he does have perfusion to his toes Psychiatric:  grossly normal mood and affect, speech fluent and appropriate but limited by pain, AOx3 Neurologic:  CN 2-12 grossly intact, moves all extremities in coordinated fashion other than RLE   Radiological Exams on Admission: Independently reviewed - see discussion in A/P where applicable  MR TIBIA FIBULA RIGHT WO CONTRAST  Result Date: 01/26/2022 CLINICAL DATA:  Soft tissue infection. Patient has been on IV antibiotics. Evaluate for progression. EXAM: MRI OF THE RIGHT KNEE WITHOUT CONTRAST; MRI OF LOWER RIGHT EXTREMITY WITHOUT CONTRAST TECHNIQUE: Multiplanar, multisequence MR imaging of the knee was performed. No intravenous contrast was administered. Multiplanar, multisequence MR imaging of the right lower leg was performed. No intravenous contrast was administered. COMPARISON:  10/26/2021. FINDINGS: MENISCI Medial: Severe attenuation of the posterior horn and body of the medial meniscus likely reflecting prior meniscectomy. Lateral: Intact. LIGAMENTS Cruciates: Complete ACL tear.  Intact PCL. Collaterals: Medial collateral ligament is intact. Lateral collateral ligament complex is intact. CARTILAGE Patellofemoral: Full-thickness cartilage loss of the patellar apex and medial patellar facet with subchondral reactive marrow edema. Partial-thickness cartilage loss of the lateral trochlea. Medial: Extensive full-thickness cartilage loss of the medial  femorotibial compartment with severe subchondral marrow edema. Lateral: Partial-thickness cartilage loss of the lateral femorotibial compartment with mild subchondral marrow edema. JOINT: Large joint effusion with extensive synovitis. 8.8 x 3.6 x 12.8 cm complex fluid collection between the soleus muscle and gastrocnemius muscles which appears contiguous with the posterolateral joint space at the level of the musculotendinous junction of the popliteus with numerous low signal foci within the collection consistent with air. Normal Hoffa's fat-pad. No plical thickening. POPLITEAL FOSSA: Popliteus tendon is intact. No Baker's cyst. EXTENSOR MECHANISM: Intact quadriceps tendon. Intact patellar tendon. Intact lateral patellar retinaculum. Intact medial patellar retinaculum. Intact MPFL. BONES: No aggressive osseous lesion. No fracture or dislocation. Other: Soft tissue edema circumferentially around the knee and right lower leg concerning for cellulitis. Muscle edema in the popliteus muscle, inferior aspect of the medial gastrocnemius muscle, inferior aspect of the lateral gastrocnemius muscle and the anterior compartment musculature concerning for myositis. Small prepatellar fluid collection measuring 3.4 x 2 x 0.3 cm. IMPRESSION: 1. Large joint effusion with extensive synovitis concerning for septic arthritis. 8.8 x 3.6 x 12.8 cm complex fluid collection between the soleus muscle and gastrocnemius muscles which appears contiguous with the posterolateral joint space at the level of the musculotendinous junction of the popliteus with numerous low signal foci within the collection consistent with air concerning for an abscess. Subchondral bone marrow edema centered at the medial femorotibial compartment and to lesser extent lateral femorotibial compartment concerning for developing osteomyelitis versus reactive marrow edema from surgery and chondral loss. 2. Muscle edema in the popliteus muscle, inferior aspect of the  medial gastrocnemius muscle, inferior aspect of the lateral gastrocnemius muscle and the anterior compartment musculature concerning for myositis. 3. Generalized soft tissue edema throughout the right lower leg and around the knee concerning for cellulitis. 4. Small prepatellar fluid collection measuring 3.4 x 2 x  0.3 cm likely reflecting a postoperative seroma or bursitis. 5. Tricompartmental cartilage abnormalities as described above most severe in the medial femorotibial compartment. 6. Severe attenuation of the posterior horn and body of the medial meniscus likely reflecting prior meniscectomy. 7. Complete tear of the ACL. Electronically Signed   By: Kathreen Devoid M.D.   On: 01/26/2022 09:27   MR KNEE RIGHT WO CONTRAST  Result Date: 01/26/2022 CLINICAL DATA:  Soft tissue infection. Patient has been on IV antibiotics. Evaluate for progression. EXAM: MRI OF THE RIGHT KNEE WITHOUT CONTRAST; MRI OF LOWER RIGHT EXTREMITY WITHOUT CONTRAST TECHNIQUE: Multiplanar, multisequence MR imaging of the knee was performed. No intravenous contrast was administered. Multiplanar, multisequence MR imaging of the right lower leg was performed. No intravenous contrast was administered. COMPARISON:  10/26/2021. FINDINGS: MENISCI Medial: Severe attenuation of the posterior horn and body of the medial meniscus likely reflecting prior meniscectomy. Lateral: Intact. LIGAMENTS Cruciates: Complete ACL tear.  Intact PCL. Collaterals: Medial collateral ligament is intact. Lateral collateral ligament complex is intact. CARTILAGE Patellofemoral: Full-thickness cartilage loss of the patellar apex and medial patellar facet with subchondral reactive marrow edema. Partial-thickness cartilage loss of the lateral trochlea. Medial: Extensive full-thickness cartilage loss of the medial femorotibial compartment with severe subchondral marrow edema. Lateral: Partial-thickness cartilage loss of the lateral femorotibial compartment with mild subchondral  marrow edema. JOINT: Large joint effusion with extensive synovitis. 8.8 x 3.6 x 12.8 cm complex fluid collection between the soleus muscle and gastrocnemius muscles which appears contiguous with the posterolateral joint space at the level of the musculotendinous junction of the popliteus with numerous low signal foci within the collection consistent with air. Normal Hoffa's fat-pad. No plical thickening. POPLITEAL FOSSA: Popliteus tendon is intact. No Baker's cyst. EXTENSOR MECHANISM: Intact quadriceps tendon. Intact patellar tendon. Intact lateral patellar retinaculum. Intact medial patellar retinaculum. Intact MPFL. BONES: No aggressive osseous lesion. No fracture or dislocation. Other: Soft tissue edema circumferentially around the knee and right lower leg concerning for cellulitis. Muscle edema in the popliteus muscle, inferior aspect of the medial gastrocnemius muscle, inferior aspect of the lateral gastrocnemius muscle and the anterior compartment musculature concerning for myositis. Small prepatellar fluid collection measuring 3.4 x 2 x 0.3 cm. IMPRESSION: 1. Large joint effusion with extensive synovitis concerning for septic arthritis. 8.8 x 3.6 x 12.8 cm complex fluid collection between the soleus muscle and gastrocnemius muscles which appears contiguous with the posterolateral joint space at the level of the musculotendinous junction of the popliteus with numerous low signal foci within the collection consistent with air concerning for an abscess. Subchondral bone marrow edema centered at the medial femorotibial compartment and to lesser extent lateral femorotibial compartment concerning for developing osteomyelitis versus reactive marrow edema from surgery and chondral loss. 2. Muscle edema in the popliteus muscle, inferior aspect of the medial gastrocnemius muscle, inferior aspect of the lateral gastrocnemius muscle and the anterior compartment musculature concerning for myositis. 3. Generalized soft  tissue edema throughout the right lower leg and around the knee concerning for cellulitis. 4. Small prepatellar fluid collection measuring 3.4 x 2 x 0.3 cm likely reflecting a postoperative seroma or bursitis. 5. Tricompartmental cartilage abnormalities as described above most severe in the medial femorotibial compartment. 6. Severe attenuation of the posterior horn and body of the medial meniscus likely reflecting prior meniscectomy. 7. Complete tear of the ACL. Electronically Signed   By: Kathreen Devoid M.D.   On: 01/26/2022 09:27    EKG:  not done   Labs on Admission: I  have personally reviewed the available labs and imaging studies at the time of the admission.  Pertinent labs:    K+ 3.2 Glucose 217 CRP 4.6 ESR 92 WBC 10.9 Hgb 12.6 COVID/flu negative MRSA PCR negative A1c 7.5    Assessment and Plan: * Septic arthritis of knee, right (HCC)- (present on admission) -Patient with knee arthroscopy in January with persistent infection since despite PICC line and Rocephin -Cultures from 2/1 grew Serratia -He was also recently treated with PO Doxy -He presented yesterday with recurrent septic arthritis of the knee but also with extension of the infection down the posterolateral joint space and into the calf muscle -He is developing what appears to be osteo and this could become a limb threatening infection -He needs aggressive therapy to improve his healing including smoking cessation and good DM control -He is s/p I&D of the knee and calf -Blood cultures are pending; if positive, the PICC will need to be removed/exchanged -ID consult -Will continue Vanc and Cefepime for now pending wound cultures  Chronic pain syndrome- (present on admission) -I have reviewed this patient in the [place] Controlled Substances Reporting System.  He is receiving medications from multiple providers. -He is at particularly high risk of opioid misuse, diversion, or overdose. -Additionally, he is unlikely to get  effective pain control given his pain tolerance, needs to wean as an outpatient if possible -For now, continue Neurontin, Nucynta, Oxy IR  DVT (deep venous thrombosis) ([place])- (present on admission) -Recently diagnosed, developed post-operatively -Resume Xarelto post-op when approved by orthopedics  Opioid-induced constipation ([facility])- (present on admission) -Continue Movantik  Dyslipidemia- (present on admission) -Continue Lipitor  COPD without exacerbation ([place])- (present on admission) -Smoking cessation is essential! -Continue Breo, Combivent  Anxiety and depression- (present on admission) -Continue Wellbutrin, Celexa  Class 1 obesity due to excess calories with body mass index (BMI) of 33.0 to 33.9 in adult -BMI is 33.55 -Weight loss should be encouraged -Outpatient PCP/bariatric medicine/bariatric surgery f/u encouraged  OSA (obstructive sleep apnea)- (present on admission) -Will order CPAP  Type 2 diabetes mellitus with hyperglycemia ([place])- (present on admission) -Will check A1c -hold Glucophage, glipizide, Jardiance -Continue glargine (on Tresiba at home) 25 units daily -Cover with moderate-scale SSI -Needs excellent ongoing control -Will consult diabetes coordinator  Cigarette smoker- (present on admission) -Encourage cessation.   -Patch ordered at patient request.       Advance Care Planning:   Code Status: Prior Full Code  Consults: Orthopedics; ID; DM coordinator  DVT Prophylaxis: Xarelto  Family Communication: None present  Severity of Illness: The appropriate patient status for this patient is INPATIENT. Inpatient status is judged to be reasonable and necessary in order to provide the required intensity of service to ensure the patient's safety. The patient's presenting symptoms, physical exam findings, and initial radiographic and laboratory data in the context of their chronic comorbidities is felt to place them at high risk for further clinical  deterioration. Furthermore, it is not anticipated that the patient will be medically stable for discharge from the hospital within 2 midnights of admission.   * I certify that at the point of admission it is my clinical judgment that the patient will require inpatient hospital care spanning beyond 2 midnights from the point of admission due to high intensity of service, high risk for further deterioration and high frequency of surveillance required.*  Author: Karmen Bongo, MD 01/26/2022 11:41 AM  For on call review [email or web address].

## 2022-01-26 NOTE — Progress Notes (Signed)
MDA paged pt had 6-8 beat run of V tach asymptomatic.  Pt was A & Ox 4 and converted back into a normal rhythm with no intervention.  MDA also notified pt is still having significant pain in R Knee but has received 150 fentanyl and 5 oxy IR in PACU and RR are 9-10/min.  MDA doesn't want to do any more pain medication at this time.  CBG told to MDA as 159 will not treat that either.  Will continue to monitor and notify for further changes.

## 2022-01-26 NOTE — Progress Notes (Signed)
Confirmation from attending it is OK to return patient to the room he came from.

## 2022-01-26 NOTE — Assessment & Plan Note (Addendum)
-  Will check A1c -hold Glucophage, glipizide, Jardiance -Continue glargine (on Tresiba at home) 25 units daily -Cover with moderate-scale SSI -Needs excellent ongoing control -Will consult diabetes coordinator

## 2022-01-26 NOTE — Consult Note (Addendum)
[name] for Infectious Disease  Total days of antibiotics 2/vanco-cefepime  Reason for Consult: recurrent septic arthritis and calf abscess   Referring Physician: yates  Principal Problem:   Septic arthritis of knee, right (Joseph [name]) Active Problems:   Cigarette smoker   Chronic pain syndrome   Type 2 diabetes mellitus with hyperglycemia (HCC)   OSA (obstructive sleep apnea)   Class 1 obesity due to excess calories with body mass index (BMI) of 33.0 to 33.9 in adult   Anxiety and depression   COPD without exacerbation (HCC)   Dyslipidemia   Opioid-induced constipation ([facility])   DVT (deep venous thrombosis) (HCC)    HPI: Joseph [name] is a 51 y.o. male  with history of T2DM, COPD, obesity, who underwent right sided medial and lateral meniscectomy and chondropasty in Dec 20, 2021- which subsequently became infected with serratia species. He underwent arthroscopic I x D on 01/02/22, but also was found to have gastrocnemius DVT at that time (placed on xarelto for 3 months). He was discharged on dual oral abtx (bactrim ds daily -levofloxacin daily)but when seen in ID clinic on 2/10 - decision was made to switch him to ceftriaxone 2 gm IV daily x 6 wk which was started on 2/14. On 2/23 the patient called the Id clinic mentioning that he is noticing increasing redness and swelling to his right knee & calf and would be seen in the ortho clinic the following day. At ortho clinic, his exam was suspicious for recurrent septic arthritis as well as calf abscess were U/S guided aspiration yielded 10 mL frank purulence. He was admitted on 2/23 evening for repeat IX D. WBC of 10K, but MRI showed large joint effusion with extensive synovitis. 8.8 x 3.6 x 12.8 cm complex fluid collection between the soleus m. and gastrocneimius m .- which appeared continguous with the PL joint space at the level of the musculotendinous junction. Also subchondral bone marrow edema concerning for developing OM vs. Reactive  marrow edema. He underwent I x D by Dr Zachery Dakins on 2/24 with OR report commenting on unhealthy serosanginous and yellow fluid in knee joint and also did I x D of calf abscess where purulent fluid was evacuated and irrigated copiously. He was started on vancomycin and cefepime until OR cultures returned. He remains afebrile. Patient reports significant pain since surgery     Past Medical History:  Diagnosis Date   Acute encephalopathy 03/24/2018   Anxiety    Arthritis    oa left knee and lower back   Asthma    Cataract    hx of bilateral   CHF (congestive heart failure) (HCC)    Chronic back pain    Chronic pain of left knee    COPD (chronic obstructive pulmonary disease) ([place])    Diabetes mellitus    type 2   DVT (deep venous thrombosis) ([place]) 01/09/2022   GERD (gastroesophageal reflux disease)    INGUINAL PAIN, LEFT 10/03/2009   Qualifier: Diagnosis of  By: Oneida Alar MD, Sandi L    Oral thrush 11/10/2018   Overdose    03-24-18 accidental   Rash and nonspecific skin eruption 03/12/2017   Septic arthritis ([place]) 01/09/2022   Shortness of breath    with heavy exertion   Sleep apnea     Allergies:  Allergies  Allergen Reactions   Azithromycin Hives   Clarithromycin    Erythromycin Hives   Keflex [Cephalexin] Nausea Only   Metformin Nausea And Vomiting   Symbicort [Budesonide-Formoterol  Fumarate] Other (See Comments)    States that 3 doses were used and breathing became worse    MEDICATIONS:  aspirin  81 mg Oral BID   atorvastatin  40 mg Oral Daily   buPROPion  150 mg Oral BID   Chlorhexidine Gluconate Cloth  6 each Topical Daily   citalopram  40 mg Oral Daily   fentaNYL       fentaNYL       fluticasone furoate-vilanterol  1 puff Inhalation Daily   gabapentin  600 mg Oral BID   insulin aspart  0-20 Units Subcutaneous TID WC   insulin glargine-yfgn  25 Units Subcutaneous Daily   ketorolac  30 mg Intravenous Q6H   living well with diabetes book   Does not apply Once    loratadine  10 mg Oral Daily   mupirocin ointment  1 application Nasal BID   naloxegol oxalate  25 mg Oral q morning   nicotine  21 mg Transdermal Daily   oxyCODONE       pantoprazole  40 mg Oral Daily   [START ON 01/27/2022] rivaroxaban  10 mg Oral Daily   tamsulosin  0.4 mg Oral Daily    Social History   Tobacco Use   Smoking status: Every Day    Packs/day: 1.25    Years: 26.00    Pack years: 32.50    Types: Cigarettes    Start date: 12/17/1993   Smokeless tobacco: Never   Tobacco comments:    1 ppd 01/04/2020  Vaping Use   Vaping Use: Former  Substance Use Topics   Alcohol use: Never    Alcohol/week: 0.0 standard drinks   Drug use: Never    Family History  Problem Relation Age of Onset   Emphysema Maternal Grandfather    Emphysema Maternal Grandmother    Clotting disorder Maternal Grandmother     Review of Systems -  Constitutional: + for fever, chills, diaphoresis, activity change, appetite change, fatigue and unexpected weight change.  HENT: Negative for congestion, sore throat, rhinorrhea, sneezing, trouble swallowing and sinus pressure.  Eyes: Negative for photophobia and visual disturbance.  Respiratory: Negative for cough, chest tightness, shortness of breath, wheezing and stridor.  Cardiovascular: Negative for chest pain, palpitations and leg swelling.  Gastrointestinal: Negative for nausea, vomiting, abdominal pain, diarrhea, constipation, blood in stool, abdominal distention and anal bleeding.  Genitourinary: Negative for dysuria, hematuria, flank pain and difficulty urinating.  Musculoskeletal: +right knee and calf pain. Negative for myalgias, back pain, joint swelling, arthralgias and gait problem.  Skin: Negative for color change, pallor, rash and wound.  Neurological: Negative for dizziness, tremors, weakness and light-headedness.  Hematological: Negative for adenopathy. Does not bruise/bleed easily.  Psychiatric/Behavioral: Negative for behavioral  problems, confusion, sleep disturbance, dysphoric mood, decreased concentration and agitation.    OBJECTIVE: Temp:  [97.1 F (36.2 C)-98.9 F (37.2 C)] 98.5 F (36.9 C) (02/25 1040) Pulse Rate:  [75-106] 89 (02/25 1040) Resp:  [9-19] 14 (02/25 1040) BP: (119-150)/(41-95) 140/89 (02/25 1040) SpO2:  [90 %-98 %] 97 % (02/25 1131) Physical Exam  Constitutional: He is oriented to person, place, and time. He appears well-developed and well-nourished. No distress.  HENT:  Mouth/Throat: Oropharynx is clear and moist. No oropharyngeal exudate.  Cardiovascular: Normal rate, regular rhythm and normal heart sounds. Exam reveals no gallop and no friction rub.  No murmur heard.  Pulmonary/Chest: Effort normal and breath sounds normal. No respiratory distress. He has no wheezes.  Abdominal: Soft. Bowel sounds are normal. He  exhibits no distension. There is no tenderness.  eXt: right  knee wrapped with 2 accordion drain placed Neurological: He is alert and oriented to person, place, and time.  Skin: Skin is warm and dry. No rash noted. No erythema.  Psychiatric: He has a normal mood and affect. His behavior is normal.    LABS: Results for orders placed or performed during the hospital encounter of 01/25/22 (from the past 48 hour(s))  Basic metabolic panel     Status: Abnormal   Collection Time: 01/25/22  3:23 PM  Result Value Ref Range   Sodium 138 135 - 145 mmol/L   Potassium 3.2 (L) 3.5 - 5.1 mmol/L   Chloride 98 98 - 111 mmol/L   CO2 27 22 - 32 mmol/L   Glucose, Bld 217 (H) 70 - 99 mg/dL    Comment: Glucose reference range applies only to samples taken after fasting for at least 8 hours.   BUN 7 6 - 20 mg/dL   Creatinine, Ser 0.69 0.61 - 1.24 mg/dL   Calcium 9.7 8.9 - 10.3 mg/dL   GFR, Estimated >60 >60 mL/min    Comment: (NOTE) Calculated using the CKD-EPI Creatinine Equation (2021)    Anion gap 13 5 - 15    Comment: Performed at [facility], [address], [place], [place] [address]  CBC with Differential     Status: Abnormal   Collection Time: 01/25/22  3:23 PM  Result Value Ref Range   WBC 10.9 (H) 4.0 - 10.5 K/uL   RBC 4.54 4.22 - 5.81 MIL/uL   Hemoglobin 12.6 (L) 13.0 - 17.0 g/dL   HCT 39.0 39.0 - 52.0 %   MCV 85.9 80.0 - 100.0 fL   MCH 27.8 26.0 - 34.0 pg   MCHC 32.3 30.0 - 36.0 g/dL   RDW 14.1 11.5 - 15.5 %   Platelets 350 150 - 400 K/uL   nRBC 0.0 0.0 - 0.2 %   Neutrophils Relative % 66 %   Neutro Abs 7.1 1.7 - 7.7 K/uL   Lymphocytes Relative 23 %   Lymphs Abs 2.5 0.7 - 4.0 K/uL   Monocytes Relative 8 %   Monocytes Absolute 0.9 0.1 - 1.0 K/uL   Eosinophils Relative 2 %   Eosinophils Absolute 0.2 0.0 - 0.5 K/uL   Basophils Relative 1 %   Basophils Absolute 0.1 0.0 - 0.1 K/uL   Immature Granulocytes 0 %   Abs Immature Granulocytes 0.04 0.00 - 0.07 K/uL    Comment: Performed at [facility], [address], [place], [place] [address]  Resp Panel by RT-PCR (Flu A&B, Covid) Nasopharyngeal Swab     Status: None   Collection Time: 01/25/22  3:23 PM   Specimen: Nasopharyngeal Swab; Nasopharyngeal(NP) swabs in vial transport medium  Result Value Ref Range   SARS Coronavirus 2 by RT PCR NEGATIVE NEGATIVE    Comment: (NOTE) SARS-CoV-2 target nucleic acids are NOT DETECTED.  The SARS-CoV-2 RNA is generally detectable in upper respiratory specimens during the acute phase of infection. The lowest concentration of SARS-CoV-2 viral copies this assay can detect is 138 copies/mL. A negative result does not preclude SARS-Cov-2 infection and should not be used as the sole basis for treatment or other patient management decisions. A negative result may occur with  improper specimen collection/handling, submission of specimen other than nasopharyngeal swab, presence of viral mutation(s) within the areas targeted by this assay, and inadequate number of viral copies(<138 copies/mL). A negative result must be  combined  with clinical observations, patient history, and epidemiological information. The expected result is Negative.  Fact Sheet for Patients:  EntrepreneurPulse.com.au  Fact Sheet for Healthcare Providers:  IncredibleEmployment.be  This test is no t yet approved or cleared by the [place] FDA and  has been authorized for detection and/or diagnosis of SARS-CoV-2 by FDA under an Emergency Use Authorization (EUA). This EUA will remain  in effect (meaning this test can be used) for the duration of the COVID-19 declaration under Section 564(b)(1) of the Act, 21 U.S.C.section 360bbb-3(b)(1), unless the authorization is terminated  or revoked sooner.       Influenza A by PCR NEGATIVE NEGATIVE   Influenza B by PCR NEGATIVE NEGATIVE    Comment: (NOTE) The Xpert Xpress SARS-CoV-2/FLU/RSV plus assay is intended as an aid in the diagnosis of influenza from Nasopharyngeal swab specimens and should not be used as a sole basis for treatment. Nasal washings and aspirates are unacceptable for Xpert Xpress SARS-CoV-2/FLU/RSV testing.  Fact Sheet for Patients: EntrepreneurPulse.com.au  Fact Sheet for Healthcare Providers: IncredibleEmployment.be  This test is not yet approved or cleared by the [place] FDA and has been authorized for detection and/or diagnosis of SARS-CoV-2 by FDA under an Emergency Use Authorization (EUA). This EUA will remain in effect (meaning this test can be used) for the duration of the COVID-19 declaration under Section 564(b)(1) of the Act, 21 U.S.C. section 360bbb-3(b)(1), unless the authorization is terminated or revoked.  Performed at [facility], [address] [place], [place] [address]   Sedimentation rate     Status: Abnormal   Collection Time: 01/25/22  3:23 PM  Result Value Ref Range   Sed Rate 92 (H) 0 - 16 mm/hr    Comment: Performed at [facility], [address] [place], [place] [address]  C-reactive protein     Status: Abnormal   Collection Time: 01/25/22  3:23 PM  Result Value Ref Range   CRP 4.6 (H) <1.0 mg/dL    Comment: Performed at [facility], [address], [place], [place] [address]  Culture, blood (routine x 2)     Status: None (Preliminary result)   Collection Time: 01/25/22  3:30 PM   Specimen: BLOOD  Result Value Ref Range   Specimen Description      BLOOD BLOOD LEFT FOREARM Performed at [facility], [address] [place], [place] [address]    Special Requests      BOTTLES DRAWN AEROBIC AND ANAEROBIC Blood Culture adequate volume Performed at [facility], [address], [place], [place] [address]    Culture      NO GROWTH < 24 HOURS Performed at [facility] [address] [place], [place] [address]    Report Status PENDING   Culture, blood (routine x 2)     Status: None (Preliminary result)   Collection Time: 01/25/22  3:35 PM   Specimen: BLOOD  Result Value Ref Range   Specimen Description      BLOOD BLOOD RIGHT WRIST Performed at [facility], [address], [place], [place] [address]    Special Requests      BOTTLES DRAWN AEROBIC AND ANAEROBIC Blood Culture adequate volume Performed at [facility], [address], [place],  [address]    Culture      NO GROWTH < 24 HOURS Performed at [facility] [address], [place],  [address]    Report Status PENDING  Glucose, capillary     Status: Abnormal   Collection Time: 01/25/22 10:21 PM  Result Value Ref Range   Glucose-Capillary 196 (H) 70 - 99 mg/dL    Comment: Glucose reference range applies only to samples taken after fasting for at least 8 hours.  Surgical PCR screen     Status: None   Collection Time: 01/25/22 11:08 PM   Specimen: Nasal Mucosa; Nasal Swab  Result Value Ref Range   MRSA, PCR NEGATIVE  NEGATIVE   Staphylococcus aureus NEGATIVE NEGATIVE    Comment: (NOTE) The Xpert SA Assay (FDA approved for NASAL specimens in patients 48 years of age and older), is one component of a comprehensive surveillance program. It is not intended to diagnose infection nor to guide or monitor treatment. Performed at [facility], [place] [address], [place], [place] [address]   Hemoglobin A1c     Status: Abnormal   Collection Time: 01/26/22  3:58 AM  Result Value Ref Range   Hgb A1c MFr Bld 7.5 (H) 4.8 - 5.6 %    Comment: (NOTE) Pre diabetes:          5.7%-6.4%  Diabetes:              >6.4%  Glycemic control for   <7.0% adults with diabetes    Mean Plasma Glucose 168.55 mg/dL    Comment: Performed at [facility] [address], [place], [place] [address]  Glucose, capillary     Status: Abnormal   Collection Time: 01/26/22  5:48 AM  Result Value Ref Range   Glucose-Capillary 121 (H) 70 - 99 mg/dL    Comment: Glucose reference range applies only to samples taken after fasting for at least 8 hours.  Glucose, capillary     Status: Abnormal   Collection Time: 01/26/22  9:21 AM  Result Value Ref Range   Glucose-Capillary 159 (H) 70 - 99 mg/dL    Comment: Glucose reference range applies only to samples taken after fasting for at least 8 hours.  CBC with Differential/Platelet     Status: Abnormal   Collection Time: 01/26/22 10:52 AM  Result Value Ref Range   WBC 10.5 4.0 - 10.5 K/uL   RBC 4.21 (L) 4.22 - 5.81 MIL/uL   Hemoglobin 11.8 (L) 13.0 - 17.0 g/dL   HCT 36.4 (L) 39.0 - 52.0 %   MCV 86.5 80.0 - 100.0 fL   MCH 28.0 26.0 - 34.0 pg   MCHC 32.4 30.0 - 36.0 g/dL   RDW 14.0 11.5 - 15.5 %   Platelets 291 150 - 400 K/uL   nRBC 0.0 0.0 - 0.2 %   Neutrophils Relative % 87 %   Neutro Abs 9.1 (H) 1.7 - 7.7 K/uL   Lymphocytes Relative 9 %   Lymphs Abs 1.0 0.7 - 4.0 K/uL   Monocytes Relative 2 %   Monocytes Absolute 0.3 0.1 - 1.0 K/uL   Eosinophils Relative 1 %   Eosinophils  Absolute 0.1 0.0 - 0.5 K/uL   Basophils Relative 1 %   Basophils Absolute 0.1 0.0 - 0.1 K/uL   Immature Granulocytes 0 %   Abs Immature Granulocytes 0.04 0.00 - 0.07 K/uL    Comment: Performed at [facility], [address] [place], [place] [id number]  Basic metabolic panel     Status: Abnormal   Collection Time: 01/26/22 10:52 AM  Result Value Ref Range   Sodium 137 135 - 145 mmol/L   Potassium 4.0 3.5 -  5.1 mmol/L   Chloride 101 98 - 111 mmol/L   CO2 27 22 - 32 mmol/L   Glucose, Bld 211 (H) 70 - 99 mg/dL    Comment: Glucose reference range applies only to samples taken after fasting for at least 8 hours.   BUN 6 6 - 20 mg/dL   Creatinine, Ser 0.66 0.61 - 1.24 mg/dL   Calcium 8.8 (L) 8.9 - 10.3 mg/dL   GFR, Estimated >60 >60 mL/min    Comment: (NOTE) Calculated using the CKD-EPI Creatinine Equation (2021)    Anion gap 9 5 - 15    Comment: Performed at [facility] [address] [place], [place] [address]  Glucose, capillary     Status: Abnormal   Collection Time: 01/26/22 10:55 AM  Result Value Ref Range   Glucose-Capillary 242 (H) 70 - 99 mg/dL    Comment: Glucose reference range applies only to samples taken after fasting for at least 8 hours.  Glucose, capillary     Status: Abnormal   Collection Time: 01/26/22 11:58 AM  Result Value Ref Range   Glucose-Capillary 196 (H) 70 - 99 mg/dL    Comment: Glucose reference range applies only to samples taken after fasting for at least 8 hours.   Lab Results  Component Value Date   ESRSEDRATE 92 (H) 01/25/2022   Lab Results  Component Value Date   CRP 4.6 (H) 01/25/2022    MICRO: reviewed IMAGING: MR TIBIA FIBULA RIGHT WO CONTRAST  Result Date: 01/26/2022 CLINICAL DATA:  Soft tissue infection. Patient has been on IV antibiotics. Evaluate for progression. EXAM: MRI OF THE RIGHT KNEE WITHOUT CONTRAST; MRI OF LOWER RIGHT EXTREMITY WITHOUT CONTRAST TECHNIQUE: Multiplanar, multisequence MR imaging of the knee was  performed. No intravenous contrast was administered. Multiplanar, multisequence MR imaging of the right lower leg was performed. No intravenous contrast was administered. COMPARISON:  10/26/2021. FINDINGS: MENISCI Medial: Severe attenuation of the posterior horn and body of the medial meniscus likely reflecting prior meniscectomy. Lateral: Intact. LIGAMENTS Cruciates: Complete ACL tear.  Intact PCL. Collaterals: Medial collateral ligament is intact. Lateral collateral ligament complex is intact. CARTILAGE Patellofemoral: Full-thickness cartilage loss of the patellar apex and medial patellar facet with subchondral reactive marrow edema. Partial-thickness cartilage loss of the lateral trochlea. Medial: Extensive full-thickness cartilage loss of the medial femorotibial compartment with severe subchondral marrow edema. Lateral: Partial-thickness cartilage loss of the lateral femorotibial compartment with mild subchondral marrow edema. JOINT: Large joint effusion with extensive synovitis. 8.8 x 3.6 x 12.8 cm complex fluid collection between the soleus muscle and gastrocnemius muscles which appears contiguous with the posterolateral joint space at the level of the musculotendinous junction of the popliteus with numerous low signal foci within the collection consistent with air. Normal Hoffa's fat-pad. No plical thickening. POPLITEAL FOSSA: Popliteus tendon is intact. No Baker's cyst. EXTENSOR MECHANISM: Intact quadriceps tendon. Intact patellar tendon. Intact lateral patellar retinaculum. Intact medial patellar retinaculum. Intact MPFL. BONES: No aggressive osseous lesion. No fracture or dislocation. Other: Soft tissue edema circumferentially around the knee and right lower leg concerning for cellulitis. Muscle edema in the popliteus muscle, inferior aspect of the medial gastrocnemius muscle, inferior aspect of the lateral gastrocnemius muscle and the anterior compartment musculature concerning for myositis. Small  prepatellar fluid collection measuring 3.4 x 2 x 0.3 cm. IMPRESSION: 1. Large joint effusion with extensive synovitis concerning for septic arthritis. 8.8 x 3.6 x 12.8 cm complex fluid collection between the soleus muscle and gastrocnemius muscles which appears contiguous  with the posterolateral joint space at the level of the musculotendinous junction of the popliteus with numerous low signal foci within the collection consistent with air concerning for an abscess. Subchondral bone marrow edema centered at the medial femorotibial compartment and to lesser extent lateral femorotibial compartment concerning for developing osteomyelitis versus reactive marrow edema from surgery and chondral loss. 2. Muscle edema in the popliteus muscle, inferior aspect of the medial gastrocnemius muscle, inferior aspect of the lateral gastrocnemius muscle and the anterior compartment musculature concerning for myositis. 3. Generalized soft tissue edema throughout the right lower leg and around the knee concerning for cellulitis. 4. Small prepatellar fluid collection measuring 3.4 x 2 x 0.3 cm likely reflecting a postoperative seroma or bursitis. 5. Tricompartmental cartilage abnormalities as described above most severe in the medial femorotibial compartment. 6. Severe attenuation of the posterior horn and body of the medial meniscus likely reflecting prior meniscectomy. 7. Complete tear of the ACL. Electronically Signed   By: Kathreen Devoid M.D.   On: 01/26/2022 09:27   MR KNEE RIGHT WO CONTRAST  Result Date: 01/26/2022 CLINICAL DATA:  Soft tissue infection. Patient has been on IV antibiotics. Evaluate for progression. EXAM: MRI OF THE RIGHT KNEE WITHOUT CONTRAST; MRI OF LOWER RIGHT EXTREMITY WITHOUT CONTRAST TECHNIQUE: Multiplanar, multisequence MR imaging of the knee was performed. No intravenous contrast was administered. Multiplanar, multisequence MR imaging of the right lower leg was performed. No intravenous contrast was  administered. COMPARISON:  10/26/2021. FINDINGS: MENISCI Medial: Severe attenuation of the posterior horn and body of the medial meniscus likely reflecting prior meniscectomy. Lateral: Intact. LIGAMENTS Cruciates: Complete ACL tear.  Intact PCL. Collaterals: Medial collateral ligament is intact. Lateral collateral ligament complex is intact. CARTILAGE Patellofemoral: Full-thickness cartilage loss of the patellar apex and medial patellar facet with subchondral reactive marrow edema. Partial-thickness cartilage loss of the lateral trochlea. Medial: Extensive full-thickness cartilage loss of the medial femorotibial compartment with severe subchondral marrow edema. Lateral: Partial-thickness cartilage loss of the lateral femorotibial compartment with mild subchondral marrow edema. JOINT: Large joint effusion with extensive synovitis. 8.8 x 3.6 x 12.8 cm complex fluid collection between the soleus muscle and gastrocnemius muscles which appears contiguous with the posterolateral joint space at the level of the musculotendinous junction of the popliteus with numerous low signal foci within the collection consistent with air. Normal Hoffa's fat-pad. No plical thickening. POPLITEAL FOSSA: Popliteus tendon is intact. No Baker's cyst. EXTENSOR MECHANISM: Intact quadriceps tendon. Intact patellar tendon. Intact lateral patellar retinaculum. Intact medial patellar retinaculum. Intact MPFL. BONES: No aggressive osseous lesion. No fracture or dislocation. Other: Soft tissue edema circumferentially around the knee and right lower leg concerning for cellulitis. Muscle edema in the popliteus muscle, inferior aspect of the medial gastrocnemius muscle, inferior aspect of the lateral gastrocnemius muscle and the anterior compartment musculature concerning for myositis. Small prepatellar fluid collection measuring 3.4 x 2 x 0.3 cm. IMPRESSION: 1. Large joint effusion with extensive synovitis concerning for septic arthritis. 8.8 x 3.6 x  12.8 cm complex fluid collection between the soleus muscle and gastrocnemius muscles which appears contiguous with the posterolateral joint space at the level of the musculotendinous junction of the popliteus with numerous low signal foci within the collection consistent with air concerning for an abscess. Subchondral bone marrow edema centered at the medial femorotibial compartment and to lesser extent lateral femorotibial compartment concerning for developing osteomyelitis versus reactive marrow edema from surgery and chondral loss. 2. Muscle edema in the popliteus muscle, inferior aspect of the medial gastrocnemius  muscle, inferior aspect of the lateral gastrocnemius muscle and the anterior compartment musculature concerning for myositis. 3. Generalized soft tissue edema throughout the right lower leg and around the knee concerning for cellulitis. 4. Small prepatellar fluid collection measuring 3.4 x 2 x 0.3 cm likely reflecting a postoperative seroma or bursitis. 5. Tricompartmental cartilage abnormalities as described above most severe in the medial femorotibial compartment. 6. Severe attenuation of the posterior horn and body of the medial meniscus likely reflecting prior meniscectomy. 7. Complete tear of the ACL. Electronically Signed   By: Kathreen Devoid M.D.   On: 01/26/2022 09:27     Assessment/Plan:  51yo M with hx of serratia septic arthritis initially washed out on 2/1, on oral abtx up until 2/14 then switched to ceftriaxone 2gm IV daily with recurrence and secondary calf abscess, possible early osteomyelitis vs. Marrow edema. - suspect that it is still serratia infection +/- resistance to CTX AND/OR another pathogen that was not covered by ceftriaxone. Agree with continue cefepime and vancomycin for now. - will follow up on blood cx, and OR cultures - continue with current picc line (originally placed on 2/14) - will likely restart abtx clock for addn 6 wk of therapy- since he has had extensive  debridement and source control. - if blood cx positive, recommend to work up for endocarditis.  [name] [phone]

## 2022-01-26 NOTE — Assessment & Plan Note (Addendum)
-  Patient with knee arthroscopy in January with persistent infection since despite PICC line and Rocephin -Cultures from 2/1 grew Serratia -He was also recently treated with PO Doxy -He presented yesterday with recurrent septic arthritis of the knee but also with extension of the infection down the posterolateral joint space and into the calf muscle -He is developing what appears to be osteo and this could become a limb threatening infection -He needs aggressive therapy to improve his healing including smoking cessation and good DM control -He is s/p I&D of the knee and calf -Blood cultures are pending; if positive, the PICC will need to be removed/exchanged -ID consult -Will continue Vanc and Cefepime for now pending wound cultures

## 2022-01-26 NOTE — Progress Notes (Signed)
Pt had asymptomatic 6-8 beat run of Vtach.  Will notify [facility].

## 2022-01-26 NOTE — Op Note (Signed)
01/26/2022  9:18 AM  PATIENT:  Joseph [name]    PRE-OPERATIVE DIAGNOSIS: Recurrent postoperative knee joint infection, right calf abscess  POST-OPERATIVE DIAGNOSIS:  Same  PROCEDURE:  INCISION AND DRAINAGE ABSCESS right calf, and irrigation and debridement of right knee joint with partial synovectomy  SURGEON:  Monae Topping A Shanikqua Zarzycki, MD  PHYSICIAN ASSISTANT: none  ANESTHESIA:   General  PREOPERATIVE INDICATIONS:  Joseph [name] is a  51 y.o. male who had undergone right knee arthroscopic meniscectomy and chondroplasty initially with Dr. Percell Miller on January 19.  He developed an acute postoperative infection approximately 2 weeks postop and was taken for an arthroscopic irrigation and debridement on February 1.  This was also complicated by postoperative DVT.  He return to clinic yesterday with increased calf pain, knee swelling, and inability to extend the knee due to pain and inability to weight-bear due to pain.  The right calf was ultrasounded and a large fluid collection was appreciated.  This was aspirated in clinic and fluid was grossly purulent.  Given these findings he was admitted to the hospital for plan for repeat irrigation and debridement.  MRI of the right knee and right lower leg were obtained large posterior calf abscess was identified.  The knee also had a large complex effusion.  These findings repeat irrigation debridement of both the knee and the calf abscess were recommended.     The risks benefits and alternatives were discussed with the patient preoperatively including but not limited to the risks of infection, bleeding, nerve injury, cardiopulmonary complications, the need for revision surgery, among others, and the patient was willing to proceed.  ESTIMATED BLOOD LOSS: 25cc  OPERATIVE IMPLANTS: none  Specimens: 2 tissue specimens from the right knee and 2 tissue specimens from the right calf abscess were sent for aerobic anaerobic culture.  OPERATIVE FINDINGS: Right knee  joint with thick serosanguineous fluid and some thick yellow precipitate concerning for infection.  Right calf abscess with thick yellow purulent fluid.  Assess full debridement of both.  OPERATIVE PROCEDURE:  The patient was brought to the operating room and positioned supine on the operating room table.  General anesthesia was induced.  A nonsterile tourniquet was placed.  The right leg was prepped and draped in a sterile fashion.  A timeout was performed.  Patient on scheduled antibiotics with cefepime and vancomycin.  Vancomycin was just given this morning.  The leg was elevated and the tourniquet was inflated to 275 mmHg.  First, we turned our attention to the knee.  A 5 cm longitudinal incision was made over the medial aspect of the knee.  A medial parapatellar arthrotomy was performed.  Thick unhealthy serosanguineous and yellow fluid were appreciated in the knee joint.  2 specimens of tissue and fluid were collected and sent for culture.  The knee joint was debrided with a rongeur and all loose tissue was removed.  A medial synovectomy and suprapatellar synovectomy were performed.  The articular cartilage of the femur overall appeared healthy with mild grade 1/2 wear at the trochlea.  The knee was thoroughly irrigated using slow flow irrigation with cystoscopy tubing.  500 mg vancomycin powder were placed inside the knee.  A Hemovac drain was placed exiting superolaterally.  The knee was closed in interrupted fashion with 0 PDS, 2-0 PDS, 3-0 nylon.  Next we turned our attention to the calf abscess.  Given that the MRI showed that the abscess is most superficial posterior and distal to the fibula, I planned a small  longitudinal incision taking care to stay distal and posterior to the fibula to avoid the common peroneal nerve.  An approximately 4 cm incision was made.  Fascia was incised longitudinally.  I was unable to bluntly spread down and get into the abscess.  Abundant thick purulent tissue was  immediately encountered. 2 specimens of tissue and fluid were collected and sent for culture. The purulent fluid was thoroughly evacuated.  Cobb was used to debride the inner walls of the abscess.  The abscess was then thoroughly irrigated with additional 6 L of normal saline.  A total of 12 L of saline was used for the entire procedure.  A medium Hemovac drain was placed into the abscess exiting distally.  The posterior lateral incision was closed with interrupted 2-0 PDS and 3-0 nylon.  The tourniquet was deflated.  The patient was awoken from anesthesia and taken to the recovery room in stable condition.  Debridement type: Excisional Debridement   Side: Right   Body Location: Knee and calf   Tools used for debridement: scalpel, bovie cautery, cobb, and rongeur   Pre-debridement Wound size (cm):   Length: 12        Width: 8     Depth: 3    Post-debridement Wound size (cm):   Length: 12        Width: 8    Depth: 3   Debridement depth beyond dead/damaged tissue down to healthy viable tissue: yes   Tissue layer involved: subcutaneous tissue   Nature of tissue removed: Purulent fluid and devitalized Tissue   Irrigation volume: 12 L    Irrigation fluid type: Normal Saline   Post op recs: WB: WBAT Abx: cont vanc and cefepime pending Drains: remove when output <30cc per shift Imaging: none Dressing: keep intact until follow up, change PRN if soiled or saturated. DVT prophylaxis: Resume Xarelto 10mg  starting POD1, likely resume full dose anticoagulation POD3 pending drain output Follow up: 2 weeks after surgery for a wound check with Dr. Zachery Dakins at [facility].  Address: [address] [place], [place], [place] [address]  Office Phone: [phone]

## 2022-01-26 NOTE — Anesthesia Procedure Notes (Signed)
Procedure Name: LMA Insertion Date/Time: 01/26/2022 7:38 AM Performed by: Myna Bright, CRNA Pre-anesthesia Checklist: Patient identified, Emergency Drugs available, Suction available and Patient being monitored Patient Re-evaluated:Patient Re-evaluated prior to induction Oxygen Delivery Method: Circle system utilized Preoxygenation: Pre-oxygenation with 100% oxygen Induction Type: IV induction Ventilation: Mask ventilation without difficulty LMA: LMA inserted LMA Size: 4.0 Number of attempts: 1 Placement Confirmation: positive ETCO2 and breath sounds checked- equal and bilateral Tube secured with: Tape Dental Injury: Teeth and Oropharynx as per pre-operative assessment

## 2022-01-26 NOTE — Progress Notes (Signed)
EKG complete at bedside and attending notified of results.

## 2022-01-26 NOTE — Assessment & Plan Note (Signed)
Continue Wellbutrin, Celexa

## 2022-01-26 NOTE — Assessment & Plan Note (Signed)
-  Will order CPAP 

## 2022-01-26 NOTE — Progress Notes (Signed)
Attending called back to let her know about the patients episode of V tach.  She would like to get an EKG before returning patient to room.

## 2022-01-26 NOTE — Plan of Care (Signed)
  Problem: Education: Goal: Knowledge of General Education information will improve Description Including pain rating scale, medication(s)/side effects and non-pharmacologic comfort measures Outcome: Progressing   Problem: Activity: Goal: Risk for activity intolerance will decrease Outcome: Progressing   Problem: Safety: Goal: Ability to remain free from injury will improve Outcome: Progressing   

## 2022-01-26 NOTE — Transfer of Care (Signed)
Immediate Anesthesia Transfer of Care Note  Patient: Joseph [name]  Procedure(s) Performed: INCISION AND DRAINAGE ABSCESS POST OP RIGHT KNEE ARTHROSCOPY (Right: Leg Lower)  Patient Location: PACU  Anesthesia Type:General  Level of Consciousness: awake, alert , oriented and patient cooperative  Airway & Oxygen Therapy: Patient Spontanous Breathing and Patient connected to face mask oxygen  Post-op Assessment: Report given to RN, Post -op Vital signs reviewed and stable and Patient moving all extremities  Post vital signs: Reviewed and stable  Last Vitals:  Vitals Value Taken Time  BP 137/84 01/26/22 0916  Temp 36.2 C 01/26/22 0915  Pulse 96 01/26/22 0919  Resp 13 01/26/22 0919  SpO2 93 % 01/26/22 0919  Vitals shown include unvalidated device data.  Last Pain:  Vitals:   01/26/22 0718  TempSrc: Oral  PainSc: 8          Complications: No notable events documented.

## 2022-01-26 NOTE — Assessment & Plan Note (Signed)
-  Continue Lipitor °

## 2022-01-26 NOTE — Assessment & Plan Note (Signed)
-  BMI is 33.55 -Weight loss should be encouraged -Outpatient PCP/bariatric medicine/bariatric surgery f/u encouraged

## 2022-01-26 NOTE — Progress Notes (Incomplete)
Inpatient Diabetes Program Recommendations  AACE/ADA: New Consensus Statement on Inpatient Glycemic Control (2015)  Target Ranges:  Prepandial:   less than 140 mg/dL      Peak postprandial:   less than 180 mg/dL (1-2 hours)      Critically ill patients:  140 - 180 mg/dL   Lab Results  Component Value Date   GLUCAP 196 (H) 01/26/2022   HGBA1C 7.5 (H) 01/26/2022    Review of Glycemic Control  Latest Reference Range & Units 01/26/22 05:48 01/26/22 09:21 01/26/22 10:55 01/26/22 11:58  Glucose-Capillary 70 - 99 mg/dL 161 (H) 096 (H) 045 (H) 196 (H)  (H): Data is abnormally high Diabetes history: *** Outpatient Diabetes medications: *** Current orders for Inpatient glycemic control: ***  Inpatient Diabetes Program Recommendations:

## 2022-01-26 NOTE — Progress Notes (Signed)
Subjective:  Patient reports significant knee pain with any range of motion.  Unable to extend the knee past 50 degrees due to pain in both the right knee and the calf.  Denies distal numbness and tingling.  Denies pain in other joints or in extremities.  Objective:   VITALS:   Vitals:   01/25/22 1900 01/25/22 1958 01/25/22 2351 01/26/22 0458  BP: 131/75 124/89 140/89 119/72  Pulse: 95 (!) 106 (!) 101 89  Resp: '17 16 17 16  ' Temp: 98.3 F (36.8 C) 98.9 F (37.2 C) 98.1 F (36.7 C) 98.3 F (36.8 C)  TempSrc: Oral Oral Oral Oral  SpO2: 96% 97% 98% 97%    Significant effusion of the right knee.  No open wounds.  Minimal overlying erythema.  Tolerates range of motion only approximately 50 to 95 degrees with pain throughout arc of motion. Tenderness in both the anterior and superior aspects of the knee as well as the posterior aspect of the calf were firm discrete area of swelling is palpable. Intact sensation to the dorsal and plantar aspects of the foot.  Intact ankle dorsiflexion and plantarflexion.  Foot is warm and well-perfused.  Lab Results  Component Value Date   WBC 10.9 (H) 01/25/2022   HGB 12.6 (L) 01/25/2022   HCT 39.0 01/25/2022   MCV 85.9 01/25/2022   PLT 350 01/25/2022   BMET    Component Value Date/Time   NA 138 01/25/2022 1523   NA 138 10/23/2021 1227   K 3.2 (L) 01/25/2022 1523   CL 98 01/25/2022 1523   CO2 27 01/25/2022 1523   GLUCOSE 217 (H) 01/25/2022 1523   BUN 7 01/25/2022 1523   BUN 9 10/23/2021 1227   CREATININE 0.69 01/25/2022 1523   CREATININE 0.93 01/11/2022 1438   CALCIUM 9.7 01/25/2022 1523   EGFR 99 01/11/2022 1438   EGFR 90 10/23/2021 1227   GFRNONAA >60 01/25/2022 1523      MRI of the right knee and right lower leg were reviewed demonstrate large posterior calf fluid collection like its relatively subcutaneous on the posterior lateral side.  Large suprapatellar complex fluid collection and effusion.  Assessment/Plan: Day of  Surgery   Principal Problem:   Septic arthritis of knee (HCC) Recurrent right septic knee arthritis, right deep calf abscess   Discussed with the patient the MRI findings which showed a large calf abscess.  Aspiration yesterday in clinic with grossly purulent fluid.  Additionally, the patient has significant worsening effusion and pain in the right knee concerning for recurrent infection in the knee as well.  Given unclear etiology of how the abscess developed likely spread from the knee would recommend irrigation debridement of both the knee and the calf abscess today.  We will plan to send multiple cultures from both areas, debride, and irrigate, and leave drains in both. Discussed proximity of the calf abscess on the lateral side near the peroneal nerve.  We will plan to stay distal to avoid possible injury to the nerve. Other risks of surgery in the infection including recurrent infection, bleeding, nerve injury, cardiopulmonary complications, morbidity, mortality, among others, and they were willing to proceed.  Consent was signed by myself and the patient.  Right knee and calf were marked.     Nataley Bahri A Marrisa Kimber 01/26/2022, 7:04 AM   Charlies Constable, MD  Contact information:   [phone] 7am-5pm epic message Dr. Zachery Dakins, or call office for patient follow up: [phone] After hours and holidays please check  [email or web address] for group call information for [facility]

## 2022-01-26 NOTE — Assessment & Plan Note (Signed)
-  Recently diagnosed, developed post-operatively -Resume Xarelto post-op when approved by orthopedics

## 2022-01-26 NOTE — Assessment & Plan Note (Signed)
-  Encourage cessation.   -Patch ordered at patient request.

## 2022-01-26 NOTE — Assessment & Plan Note (Signed)
-  Smoking cessation is essential! -Continue Breo, Combivent

## 2022-01-27 ENCOUNTER — Encounter (HOSPITAL_COMMUNITY): Payer: Self-pay | Admitting: Orthopedic Surgery

## 2022-01-27 DIAGNOSIS — M009 Pyogenic arthritis, unspecified: Secondary | ICD-10-CM | POA: Diagnosis not present

## 2022-01-27 LAB — GLUCOSE, CAPILLARY
Glucose-Capillary: 178 mg/dL — ABNORMAL HIGH (ref 70–99)
Glucose-Capillary: 207 mg/dL — ABNORMAL HIGH (ref 70–99)
Glucose-Capillary: 128 mg/dL — ABNORMAL HIGH (ref 70–99)
Glucose-Capillary: 222 mg/dL — ABNORMAL HIGH (ref 70–99)

## 2022-01-27 MED ORDER — EMPAGLIFLOZIN 10 MG PO TABS
10.0000 mg | ORAL_TABLET | Freq: Every day | ORAL | Status: DC
  Administered 2022-01-28 – 2022-01-30 (×3): 10 mg via ORAL
  Filled 2022-01-27 (×4): qty 1

## 2022-01-27 MED ORDER — GLIPIZIDE 5 MG PO TABS
10.0000 mg | ORAL_TABLET | Freq: Two times a day (BID) | ORAL | Status: DC
  Administered 2022-01-27 – 2022-01-30 (×6): 10 mg via ORAL
  Filled 2022-01-27 (×6): qty 2

## 2022-01-27 NOTE — Progress Notes (Signed)
CPAP set up at bedside. Patient stated he will place himself on when ready. RT will monitor as needed.

## 2022-01-27 NOTE — TOC Initial Note (Signed)
Transition of Care [facility]) - Initial/Assessment Note    Patient Details  Name: Joseph [name] MRN: [id number] Date of Birth: May 09, 1971  [facility] of [facility] CM/SW Contact:    Bartholomew Crews, RN Phone Number: [phone] 01/27/2022, 4:30 PM  Clinical Narrative:                  Attempted to reach patient by room phone x 2, but patient unable to hear NCM. Left vm on cell phone requesting call back. Verified with [facility] and [facility] that patient is active for nursing and long term antibiotics. Patient will need [place] RN order at discharge to resume home health services. TOC to follow for transition needs.   Expected Discharge Plan: [place] Barriers to Discharge: Continued Medical Work up   Patient Goals and CMS Choice   CMS Medicare.gov Compare Post Acute Care list provided to:: Patient    Expected Discharge Plan and Services Expected Discharge Plan: [place]   Discharge Planning Services: CM Consult   Living arrangements for the past 2 months: [place] Arranged: RN   Date [place]: 01/27/22 Time [place]: 1629 Representative spoke with at [place]: Corene Cornea  Prior Living Arrangements/Services Living arrangements for the past 2 months: [place] with:: Self Patient language and need for interpreter reviewed:: Yes            Current home services: Home RN, Other (comment) (long term IV antibiotics) Criminal Activity/Legal Involvement Pertinent to Current Situation/Hospitalization: No - Comment as needed  Activities of Daily Living      Permission Sought/Granted                  Emotional Assessment       Orientation: : Oriented to Self, Oriented to Place, Oriented to  Time, Oriented to Situation Alcohol / Substance Use: Not Applicable Psych Involvement: No (comment)  Admission diagnosis:  Septic arthritis of knee ([place]) [M00.9] Calf  abscess [L02.419] Right knee skin infection [L08.9] Patient Active Problem List   Diagnosis Date Noted   Septic arthritis of knee ([place]) 01/25/2022   Septic arthritis of knee, right ([place]) 01/09/2022   DVT (deep venous thrombosis) ([place]) 01/09/2022   Preoperative clearance 11/20/2021   Post-viral cough syndrome 11/20/2021   COVID-19 10/31/2021   Cervical lymphadenitis 11/27/2020   Preventative health care 07/05/2020   Leukocytosis 05/04/2020   Chronic fatigue 05/03/2020   Medicare annual wellness visit, subsequent 06/28/2019   Controlled substance agreement broken 09/14/2018   Primary osteoarthritis of knee 07/21/2018   Status post unicompartmental knee replacement, left 07/21/2018   Medicare annual wellness visit, initial 05/20/2018   Effusion of knee joint (Left) 04/21/2018   Opiate overdose ([place]) 03/24/2018   Arthropathy of knee (Left) 01/12/2018   Opioid-induced constipation ([facility]) 01/12/2018   Tinea corporis 01/07/2018   Tinea versicolor 01/07/2018   Pharmacologic therapy 12/08/2017   Problems influencing health status 12/08/2017   Long term prescription opiate use 12/08/2017   Opiate use 12/08/2017   Insomnia 11/20/2017   Disorder of skeletal system 11/05/2017   Chronic knee pain (Primary Area of Pain) (Bilateral) (L>R) 11/05/2017   Chronic wrist pain (Secondary Area of Pain) (Left) 11/05/2017   Chronic constipation 08/18/2017   Dyslipidemia 05/12/2017   COPD without exacerbation ([place]) 05/07/2017  Male hypogonadism 04/24/2017   Post-void dribbling 04/01/2017   Osteoarthritis of the knee (Left)    GERD (gastroesophageal reflux disease) 03/12/2017   Bucket handle tear of meniscus of knee (Right)    Anxiety and depression 10/07/2015   Class 1 obesity due to excess calories with body mass index (BMI) of 33.0 to 33.9 in adult 10/06/2015   OSA (obstructive sleep apnea) 08/31/2014   Testosterone deficiency 07/01/2014   S/P knee surgery 04/26/2014   Meniscal tear of knee,  sequela (Left) 04/26/2014   Medial meniscus, posterior horn derangement 04/22/2014   Intrinsic asthma 12/19/2013   Chronic pain syndrome 05/13/2013   Type 2 diabetes mellitus with hyperglycemia ([place]) 05/13/2013   Mediastinal abnormality 04/10/2012   Cigarette smoker 04/10/2012   PCP:  Pleas Koch, NP Pharmacy:   [facility], [address] [place] [address] Phone: [phone] Fax: [facility], [place] [address] [place] [address] Phone: [phone] Fax: [phone]     Social Determinants of Health (SDOH) Interventions    Readmission Risk Interventions No flowsheet data found.

## 2022-01-27 NOTE — Progress Notes (Signed)
PROGRESS NOTE  Joseph [name]  DOB: Jun 18, 1971  PCP: Doreene Nest, NP EYC:[id number]  DOA: 01/25/2022  LOS: 2 days  Hospital Day: 3  Brief narrative: Joseph [name] is a 51 y.o. male with PMH significant for obesity, sleep apnea, DM2, diastolic CHF, COPD, DVT, anxiety, chronic back pain, chronic left knee pain on opioids, GERD. Patient presented to the ED on 01/25/2022 with a septic knee. 12/20/2021, he underwent R meniscectomy with Dr. Eulah Pont, developed infection in the knee about a week later.    2/1, he had R knee I&D with Dr. Tonny Bollman.  Cultures grew Serratia Marcescens. Treated with oral doxycycline up until 2/14 then switched to ceftriaxone 2gm IV daily. 2/3, he developed acute DVT of the right gastrocnemius veins.  Started on Xarelto 2/24, seen at orthopedics office with concern of swelling and pain in the right knee and calf. US-guided aspiration of the calf was performed and 10cc of frank pus removed.  He was readmitted and underwent IND of right calf abscess and irrigation and debridement of right knee joint with partial synovectomy. Currently also being followed by ID.   Subjective: Patient was seen and examined this morning.  Pleasant middle-aged Caucasian male.  Not in distress.  Family at bedside.  Principal Problem:   Septic arthritis of knee, right (HCC) Active Problems:   Cigarette smoker   Chronic pain syndrome   Type 2 diabetes mellitus with hyperglycemia (HCC)   OSA (obstructive sleep apnea)   Class 1 obesity due to excess calories with body mass index (BMI) of 33.0 to 33.9 in adult   Anxiety and depression   COPD without exacerbation (HCC)   Dyslipidemia   Opioid-induced constipation (OIC)   DVT (deep venous thrombosis) (HCC)    Assessment and Plan: Serratia Marcescens septic arthritis of knee, right   Right calf abscess -See above for surgical and antibiotic history  -ID and orthopedics following.   -2/24, underwent IND of right calf abscess and  irrigation and debridement of right knee joint with partial synovectomy. -ID suspects that patient still has serratia infection with probable resistant to ceftriaxone. -Currently on IV cefepime and vancomycin. -Follow-up on blood culture and wound cultures.  Blood culture sent on 2/24 has not shown any growth so far. -Currently has PICC line that was placed on 2/14.  Right leg DVT -Diagnosed on 2/3. -Currently on Xarelto  Type 2 diabetes mellitus -A1c 7.5 on 2/25 -Home meds include Tresiba, glipizide, metformin, Jardiance. -Currently on Semglee at 25 units daily and sliding scale insulin.  Oral meds on hold.   -Blood sugar level remains elevated to over 200 mostly, 178 this morning. -Renal function stable.  I will resume glipizide and Jardiance this morning.  Keep metformin on hold. Recent Labs  Lab 01/26/22 1158 01/26/22 1619 01/26/22 2305 01/27/22 0728 01/27/22 1155  GLUCAP 196* 291* 397* 178* 207*   Chronic pain syndrome  -Chronically on opioids.  It seems he is receiving prescriptions from multiple providers.  At high risk  of opioid misuse, diversion, or overdose. -Additionally, he is unlikely to get effective pain control given his pain tolerance, needs to wean as an outpatient if possible -For now, continue Neurontin, Nucynta, Oxy IR  Opioid-induced constipation -Continue Movantik  Dyslipidemia  -Continue Lipitor  COPD without exacerbation   -Smoking cessation is essential! -Continue Breo, Combivent  Anxiety and depression  -Continue Wellbutrin, Celexa  Obesity -There is no height or weight on file to calculate BMI. Patient has been advised to make an  attempt to improve diet and exercise patterns to aid in weight loss.  OSA (obstructive sleep apnea)  -Continue CPAP  Cigarette smoker  -Encourage cessation.   -Patch ordered at patient request.   Goals of care   Code Status: Prior    Mobility:   Nutritional status:  There is no height or weight on file  to calculate BMI.          Diet:  Diet Order             Diet Carb Modified Fluid consistency: Thin; Room service appropriate? Yes  Diet effective now                   DVT prophylaxis:  rivaroxaban (XARELTO) tablet 10 mg Start: 01/27/22 1000 rivaroxaban (XARELTO) tablet 10 mg   Antimicrobials: Cefepime vancomycin Fluid: None Family Communication: Family at bedside  Status is: Inpatient  Continue in-hospital care because: Continue IV antibiotics for septic arthritis Level of care: Med-Surg   Dispo: The patient is from: Home              Anticipated d/c is to: Home after orthopedic clearance              Patient currently is not medically stable to d/c.   Difficult to place patient No     Infusions:   ceFEPime (MAXIPIME) IV 2 g (01/27/22 0805)   vancomycin 1,250 mg (01/27/22 0538)    Scheduled Meds:  aspirin  81 mg Oral BID   atorvastatin  40 mg Oral Daily   buPROPion  150 mg Oral BID   Chlorhexidine Gluconate Cloth  6 each Topical Daily   citalopram  40 mg Oral Daily   empagliflozin  10 mg Oral Daily   fluticasone furoate-vilanterol  1 puff Inhalation Daily   gabapentin  600 mg Oral BID   glipiZIDE  10 mg Oral BID AC   insulin aspart  0-20 Units Subcutaneous TID WC   insulin glargine-yfgn  25 Units Subcutaneous Daily   loratadine  10 mg Oral Daily   mupirocin ointment  1 application Nasal BID   naloxegol oxalate  25 mg Oral q morning   nicotine  21 mg Transdermal Daily   pantoprazole  40 mg Oral Daily   rivaroxaban  10 mg Oral Daily   tamsulosin  0.4 mg Oral Daily    PRN meds: albuterol, cyclobenzaprine, fluticasone, HYDROmorphone (DILAUDID) injection, ipratropium-albuterol, oxyCODONE, sodium chloride flush, tapentadol, zolpidem   Antimicrobials: Anti-infectives (From admission, onward)    Start     Dose/Rate Route Frequency Ordered Stop   01/26/22 0758  vancomycin (VANCOCIN) powder  Status:  Discontinued          As needed 01/26/22 0758  01/26/22 0912   01/26/22 0600  vancomycin (VANCOREADY) IVPB 1250 mg/250 mL        1,250 mg 166.7 mL/hr over 90 Minutes Intravenous Every 12 hours 01/25/22 1748     01/25/22 1700  vancomycin (VANCOCIN) IVPB 1000 mg/200 mL premix        1,000 mg 200 mL/hr over 60 Minutes Intravenous  Once 01/25/22 1551 01/25/22 1906   01/25/22 1600  ceFEPIme (MAXIPIME) 2 g in sodium chloride 0.9 % 100 mL IVPB        2 g 200 mL/hr over 30 Minutes Intravenous Every 8 hours 01/25/22 1546     01/25/22 1600  vancomycin (VANCOCIN) IVPB 1000 mg/200 mL premix        1,000 mg 200 mL/hr over 60  Minutes Intravenous  Once 01/25/22 1551 01/25/22 1807       Objective: Vitals:   01/27/22 0500 01/27/22 1321  BP: 112/81   Pulse: 86   Resp: 14   Temp: 97.6 F (36.4 C) 97.8 F (36.6 C)  SpO2: 94%     Intake/Output Summary (Last 24 hours) at 01/27/2022 1411 Last data filed at 01/27/2022 0705 Gross per 24 hour  Intake 471.77 ml  Output 2930 ml  Net -2458.23 ml   There were no vitals filed for this visit. Weight change:  There is no height or weight on file to calculate BMI.   Physical Exam: General exam: Pleasant, middle-aged Caucasian male. Skin: No rashes, lesions or ulcers. HEENT: Atraumatic, normocephalic, no obvious bleeding Lungs: Clear to auscultation bilaterally CVS: Regular rate and rhythm, no murmur GI/Abd soft, nontender, nondistended, bowels are present CNS: Alert, awake, oriented x3 Psychiatry: Mood appropriate Extremities: No pedal edema, no calf tenderness  Data Review: I have personally reviewed the laboratory data and studies available.  F/u labs ordered Unresulted Labs (From admission, onward)     Start     Ordered   01/28/22 0500  Creatinine, serum  Daily,   R     Comments: While on vancomycin   Question:  Specimen collection method  Answer:  IV Team=IV Team collect   01/27/22 1118   01/28/22 0500  CBC with Differential/Platelet  Tomorrow morning,   R       Question:  Specimen  collection method  Answer:  IV Team=IV Team collect   01/27/22 1411   01/28/22 0500  Basic metabolic panel  Tomorrow morning,   R       Question:  Specimen collection method  Answer:  IV Team=IV Team collect   01/27/22 1411            Signed, Lorin Glass, MD [facility] 01/27/2022

## 2022-01-27 NOTE — Progress Notes (Signed)
ID PROGRESS NOTE  OR culture still not having growth, although one of the specimen gram stain showing GPC   50yo with septic arthritis of right knee and secondary calf abscess requiring repeat I X D (currently being treated for serratia septic arthritis of right knee)  - continue with vancomycin and cefepime  Jaslynn Thome B. Drue Second MD MPH [facility] [phone]

## 2022-01-27 NOTE — Progress Notes (Addendum)
Orthopaedic Trauma Progress Note  SUBJECTIVE: Doing okay today, had significant postoperative pain yesterday but after adjustment of medications patient now much more comfortable.  Still having significant limited range of motion of the right knee.  Notes that his drains have not put out a significant amount.  Have only had to be emptied once by nursing staff. No chest pain. No SOB. No nausea/vomiting. No other complaints.  Intraoperative cultures with no growth, although one specimen Gram stain showing gram-positive cocci.  OBJECTIVE:  Vitals:   01/26/22 2100 01/27/22 0500  BP: 125/76 112/81  Pulse: 98 86  Resp: 16 14  Temp: 98.2 F (36.8 C) 97.6 F (36.4 C)  SpO2: 96% 94%    General: Sitting up in bed, NAD.  Pleasant and cooperative Respiratory: No increased work of breathing.  Right lower extremity: Knee held in about 50 degrees of flexion.  Dressing is clean, dry, intact.  Hemovac containers with minimal output currently due to recently being emptied (110 mL from knee drain and 20 mL from leg drain emptied this AM).  Ankle DF/PF is intact.  Endorses sensation to light touch distally over all aspects of the foot.  Able to wiggle toes.  Neurovascularly intact.    LABS:  Results for orders placed or performed during the hospital encounter of 01/25/22 (from the past 24 hour(s))  Glucose, capillary     Status: Abnormal   Collection Time: 01/26/22  4:19 PM  Result Value Ref Range   Glucose-Capillary 291 (H) 70 - 99 mg/dL  Glucose, capillary     Status: Abnormal   Collection Time: 01/26/22 11:05 PM  Result Value Ref Range   Glucose-Capillary 397 (H) 70 - 99 mg/dL  Glucose, capillary     Status: Abnormal   Collection Time: 01/27/22  7:28 AM  Result Value Ref Range   Glucose-Capillary 178 (H) 70 - 99 mg/dL  Glucose, capillary     Status: Abnormal   Collection Time: 01/27/22 11:55 AM  Result Value Ref Range   Glucose-Capillary 207 (H) 70 - 99 mg/dL    ASSESSMENT: Joseph [name] is a  51 y.o. male, 1 Day Post-Op s/p INCISION AND DRAINAGE ABSCESS POST OP RIGHT KNEE ARTHROSCOPY  CV/Blood loss: Pre-op Hgb 11.8.  Patient is hemodynamically stable  PLAN: Weightbearing: WBAT RLE ROM: Unrestricted knee motion as tolerated Incisional and dressing care: Reinforce dressings as needed.  Maintain Hemovac drains and empty as needed Showering: Hold off for now Orthopedic device(s): None  Pain management:  1. Gabapentin 600 mg BID 2. Flexeril 10 mg BID PRN 3. Oxycodone 5-10 mg q 4 hours PRN 4. Dilaudid 1 mg q 3 hours PRN 5.  Nucynta 75 mg TID PRN VTE prophylaxis: Xarelto 10 mg, SCDs ID: Cefepime and vancomycin per ID team Foley/Lines:  No foley, KVO IVFs Dispo: Therapies as tolerated.  Continue to monitor intra-op cultures, appreciate ID assistance with antibiotic regimen.  Maintain Hemovac drains, will likely plan to d/c Monday 01/28/22 if output <30 mL/24 hrs. Will likely resume full dose anticoagulation POD3 pending drain output  Follow - up plan:  TBD per Dr. Margie Ege information:  Truitt Merle MD, Thyra Breed PA-C on 01/28/22. After hours and holidays please check [email or web address] for group call information for [facility]   Thompson Caul, PA-C [phone] (office) [email or web address]

## 2022-01-28 DIAGNOSIS — M009 Pyogenic arthritis, unspecified: Secondary | ICD-10-CM | POA: Diagnosis not present

## 2022-01-28 LAB — GLUCOSE, CAPILLARY
Glucose-Capillary: 176 mg/dL — ABNORMAL HIGH (ref 70–99)
Glucose-Capillary: 111 mg/dL — ABNORMAL HIGH (ref 70–99)
Glucose-Capillary: 227 mg/dL — ABNORMAL HIGH (ref 70–99)
Glucose-Capillary: 147 mg/dL — ABNORMAL HIGH (ref 70–99)

## 2022-01-28 LAB — BASIC METABOLIC PANEL
Anion gap: 8 (ref 5–15)
BUN: <5 mg/dL — ABNORMAL LOW (ref 6–20)
CO2: 28 mmol/L (ref 22–32)
Calcium: 8.1 mg/dL — ABNORMAL LOW (ref 8.9–10.3)
Chloride: 101 mmol/L (ref 98–111)
Creatinine, Ser: 0.62 mg/dL (ref 0.61–1.24)
GFR, Estimated: >60 mL/min (ref >60)
Glucose, Bld: 178 mg/dL — ABNORMAL HIGH (ref 70–99)
Potassium: 3.4 mmol/L — ABNORMAL LOW (ref 3.5–5.1)
Sodium: 137 mmol/L (ref 135–145)

## 2022-01-28 LAB — CBC WITH DIFFERENTIAL/PLATELET
Abs Immature Granulocytes: 0.03 10*3/uL (ref 0.00–0.07)
Basophils Absolute: 0.1 10*3/uL (ref 0.0–0.1)
Basophils Relative: 1 %
Eosinophils Absolute: 0.3 10*3/uL (ref 0.0–0.5)
Eosinophils Relative: 3 %
HCT: 32.6 % — ABNORMAL LOW (ref 39.0–52.0)
Hemoglobin: 10.8 g/dL — ABNORMAL LOW (ref 13.0–17.0)
Immature Granulocytes: 0 %
Lymphocytes Relative: 28 %
Lymphs Abs: 2.5 10*3/uL (ref 0.7–4.0)
MCH: 28.9 pg (ref 26.0–34.0)
MCHC: 33.1 g/dL (ref 30.0–36.0)
MCV: 87.2 fL (ref 80.0–100.0)
Monocytes Absolute: 0.7 10*3/uL (ref 0.1–1.0)
Monocytes Relative: 8 %
Neutro Abs: 5.4 10*3/uL (ref 1.7–7.7)
Neutrophils Relative %: 60 %
Platelets: 275 10*3/uL (ref 150–400)
RBC: 3.74 MIL/uL — ABNORMAL LOW (ref 4.22–5.81)
RDW: 13.9 % (ref 11.5–15.5)
WBC: 9 10*3/uL (ref 4.0–10.5)
nRBC: 0 % (ref 0.0–0.2)

## 2022-01-28 MED ORDER — POTASSIUM CHLORIDE CRYS ER 20 MEQ PO TBCR
40.0000 meq | EXTENDED_RELEASE_TABLET | Freq: Once | ORAL | Status: AC
  Administered 2022-01-28: 40 meq via ORAL
  Filled 2022-01-28: qty 2

## 2022-01-28 MED ORDER — INSULIN GLARGINE-YFGN 100 UNIT/ML ~~LOC~~ SOLN
30.0000 [IU] | Freq: Every day | SUBCUTANEOUS | Status: DC
  Administered 2022-01-28 – 2022-01-29 (×2): 30 [IU] via SUBCUTANEOUS
  Filled 2022-01-28 (×2): qty 0.3

## 2022-01-28 MED ORDER — RIVAROXABAN 20 MG PO TABS
20.0000 mg | ORAL_TABLET | Freq: Every day | ORAL | Status: DC
  Administered 2022-01-29: 20 mg via ORAL
  Filled 2022-01-28: qty 1

## 2022-01-28 NOTE — Progress Notes (Signed)
PROGRESS NOTE  Joseph [name]  DOB: Oct 03, 1971  PCP: Doreene Nest, NP VEH:[id number]  DOA: 01/25/2022  LOS: 3 days  Hospital Day: 4  Brief narrative: Joseph [name] is a 51 y.o. male with PMH significant for obesity, sleep apnea, DM2, diastolic CHF, COPD, DVT, anxiety, chronic back pain, chronic left knee pain on opioids, GERD. Patient presented to the ED on 01/25/2022 with a septic knee. 12/20/2021, he underwent R meniscectomy with Dr. Eulah Pont, developed infection in the knee about a week later.    2/1, he had R knee I&D with Dr. Tonny Bollman.  Cultures grew Serratia Marcescens. Treated with oral doxycycline up until 2/14 then switched to ceftriaxone 2gm IV daily. 2/3, he developed acute DVT of the right gastrocnemius veins.  Started on Xarelto 2/24, seen at orthopedics office with concern of swelling and pain in the right knee and calf. US-guided aspiration of the calf was performed and 10cc of frank pus removed.  He was readmitted and underwent IND of right calf abscess and irrigation and debridement of right knee joint with partial synovectomy. Currently also being followed by ID.   Subjective: Patient was seen and examined this morning.   Patient is complaining of significantly worsening pain since yesterday afternoon.  He is on Dilaudid IV as well as his chronic pain meds.  Principal Problem:   Septic arthritis of knee, right (HCC) Active Problems:   Cigarette smoker   Chronic pain syndrome   Type 2 diabetes mellitus with hyperglycemia (HCC)   OSA (obstructive sleep apnea)   Class 1 obesity due to excess calories with body mass index (BMI) of 33.0 to 33.9 in adult   Anxiety and depression   COPD without exacerbation (HCC)   Dyslipidemia   Opioid-induced constipation (OIC)   DVT (deep venous thrombosis) (HCC)    Assessment and Plan: Serratia Marcescens septic arthritis of knee, right   Right calf abscess -See above for surgical and antibiotic history  -ID and orthopedics  following.   -2/24, underwent IND of right calf abscess and irrigation and debridement of right knee joint with partial synovectomy. -ID suspects that patient still has serratia infection with probable resistant to ceftriaxone. -Currently on IV cefepime and vancomycin. -Follow-up on blood culture and wound cultures.  Blood culture sent on 2/24 has not shown any growth so far. -Currently has PICC line that was placed on 2/14.  Intractable acute pain Chronic pain syndrome  -Patient has chronic back pain is chronically on opioids.   -This morning is complaining of severe pain on the right lower extremity.   -Currently is on Neurontin, Nucynta, oxy IR as well as IV Dilaudid.  Orthopedics follow-up pending today.  May need further adjustment of IV pain meds.  Right leg DVT -Diagnosed on 2/3. -Currently on Xarelto  Type 2 diabetes mellitus -A1c 7.5 on 2/25 -Home meds include Tresiba 35 units, glipizide, metformin, Jardiance. -Currently continued on Jardiance, glipizide and Semglee at 25 units daily along with sliding scale insulin.  Metformin on hold. -Blood sugar level improving in last 24 hours.  I increase Semglee to 35 units for tonight. Recent Labs  Lab 01/27/22 1155 01/27/22 1546 01/27/22 2048 01/28/22 0825 01/28/22 1145  GLUCAP 207* 128* 222* 176* 111*    Opioid-induced constipation -Continue Movantik  Dyslipidemia  -Continue Lipitor  COPD without exacerbation   -Smoking cessation is essential! -Continue Breo, Combivent  Anxiety and depression  -Continue Wellbutrin, Celexa  Obesity -There is no height or weight on file to calculate BMI.  Patient has been advised to make an attempt to improve diet and exercise patterns to aid in weight loss.  OSA (obstructive sleep apnea)  -Continue CPAP  Cigarette smoker  -Encourage cessation.   -Patch ordered at patient request.   Goals of care   Code Status: Prior    Mobility:  PT eval ordered.  Nutritional status:   There is no height or weight on file to calculate BMI.          Diet:  Diet Order             Diet Carb Modified Fluid consistency: Thin; Room service appropriate? Yes  Diet effective now                   DVT prophylaxis:  rivaroxaban (XARELTO) tablet 10 mg Start: 01/27/22 1000 rivaroxaban (XARELTO) tablet 10 mg   Antimicrobials: Cefepime vancomycin Fluid: None Family Communication: Family at bedside  Status is: Inpatient  Continue in-hospital care because: Continue IV antibiotics for septic arthritis Level of care: Med-Surg   Dispo: The patient is from: Home              Anticipated d/c is to: Home after orthopedic clearance              Patient currently is not medically stable to d/c.   Difficult to place patient No     Infusions:   ceFEPime (MAXIPIME) IV 2 g (01/28/22 0827)   vancomycin 166.7 mL/hr at 01/28/22 0605    Scheduled Meds:  aspirin  81 mg Oral BID   atorvastatin  40 mg Oral Daily   buPROPion  150 mg Oral BID   Chlorhexidine Gluconate Cloth  6 each Topical Daily   citalopram  40 mg Oral Daily   empagliflozin  10 mg Oral Daily   fluticasone furoate-vilanterol  1 puff Inhalation Daily   gabapentin  600 mg Oral BID   glipiZIDE  10 mg Oral BID AC   insulin aspart  0-20 Units Subcutaneous TID WC   insulin glargine-yfgn  30 Units Subcutaneous Daily   loratadine  10 mg Oral Daily   mupirocin ointment  1 application Nasal BID   naloxegol oxalate  25 mg Oral q morning   nicotine  21 mg Transdermal Daily   pantoprazole  40 mg Oral Daily   rivaroxaban  10 mg Oral Daily   tamsulosin  0.4 mg Oral Daily    PRN meds: albuterol, cyclobenzaprine, fluticasone, HYDROmorphone (DILAUDID) injection, ipratropium-albuterol, oxyCODONE, sodium chloride flush, tapentadol, zolpidem   Antimicrobials: Anti-infectives (From admission, onward)    Start     Dose/Rate Route Frequency Ordered Stop   01/26/22 0758  vancomycin (VANCOCIN) powder  Status:   Discontinued          As needed 01/26/22 0758 01/26/22 0912   01/26/22 0600  vancomycin (VANCOREADY) IVPB 1250 mg/250 mL        1,250 mg 166.7 mL/hr over 90 Minutes Intravenous Every 12 hours 01/25/22 1748     01/25/22 1700  vancomycin (VANCOCIN) IVPB 1000 mg/200 mL premix        1,000 mg 200 mL/hr over 60 Minutes Intravenous  Once 01/25/22 1551 01/25/22 1906   01/25/22 1600  ceFEPIme (MAXIPIME) 2 g in sodium chloride 0.9 % 100 mL IVPB        2 g 200 mL/hr over 30 Minutes Intravenous Every 8 hours 01/25/22 1546     01/25/22 1600  vancomycin (VANCOCIN) IVPB 1000 mg/200 mL premix  1,000 mg 200 mL/hr over 60 Minutes Intravenous  Once 01/25/22 1551 01/25/22 1807       Objective: Vitals:   01/27/22 2048 01/28/22 0820  BP:  (!) 141/91  Pulse: 75 84  Resp: 16 17  Temp:  98.7 F (37.1 C)  SpO2: 95%     Intake/Output Summary (Last 24 hours) at 01/28/2022 1201 Last data filed at 01/28/2022 0606 Gross per 24 hour  Intake 1377.28 ml  Output 2380 ml  Net -1002.72 ml    There were no vitals filed for this visit. Weight change:  There is no height or weight on file to calculate BMI.   Physical Exam: General exam: Pleasant, middle-aged Caucasian male.  In mild to moderate distress because of pain Skin: No rashes, lesions or ulcers. HEENT: Atraumatic, normocephalic, no obvious bleeding Lungs: Clear to auscultation bilaterally CVS: Regular rate and rhythm, no murmur GI/Abd soft, nontender, nondistended, bowels are present CNS: Alert, awake, oriented x3 Psychiatry: Mood appropriate Extremities: No pedal edema, no calf tenderness.  Right knee site bandaged.  Has wound VAC in place.  Data Review: I have personally reviewed the laboratory data and studies available.  F/u labs ordered Unresulted Labs (From admission, onward)     Start     Ordered   01/28/22 0500  Creatinine, serum  Daily,   R     Comments: While on vancomycin   Question:  Specimen collection method  Answer:   IV Team=IV Team collect   01/27/22 1118            Signed, Lorin Glass, MD [facility] 01/28/2022

## 2022-01-28 NOTE — Progress Notes (Signed)
Cpap set up at bedside and pt states he will place himself on/off as needed.  RT will monitor

## 2022-01-28 NOTE — Progress Notes (Signed)
Orthopaedic Progress Note  SUBJECTIVE: Patient reports pain is moderately well controlled.  He reports discomfort with trying to extend his knee still.  He tolerated dressing change this afternoon without any issues.  He denies distal numbness and tingling.  He denies pain in other joints or extremities.  Cultures with no growth to date at 2 days. Encouraged him to work on ROM, especially extension.  OBJECTIVE:  Vitals:   01/27/22 2048 01/28/22 0820  BP:  (!) 141/91  Pulse: 75 84  Resp: 16 17  Temp:  98.7 F (37.1 C)  SpO2: 95%     General: Sitting up in bed, NAD.  Pleasant and cooperative Respiratory: No increased work of breathing.  Right lower extremity: Dressing removed.  Mild expected postoperative swelling about the knee.  Calf is soft and compressible.  Sutures intact, no erythema or drainage about the knee or calf incision. 2 Hemovac drains with minimal serosanguineous drainage. Tolerates ROM 20-90.   Ankle DF/PF is intact.  Endorses sensation to light touch distally over all aspects of the foot.  Able to wiggle toes.  Neurovascularly intact.    LABS:  Results for orders placed or performed during the hospital encounter of 01/25/22 (from the past 24 hour(s))  Glucose, capillary     Status: Abnormal   Collection Time: 01/27/22  3:46 PM  Result Value Ref Range   Glucose-Capillary 128 (H) 70 - 99 mg/dL  Glucose, capillary     Status: Abnormal   Collection Time: 01/27/22  8:48 PM  Result Value Ref Range   Glucose-Capillary 222 (H) 70 - 99 mg/dL  CBC with Differential/Platelet     Status: Abnormal   Collection Time: 01/28/22  3:19 AM  Result Value Ref Range   WBC 9.0 4.0 - 10.5 K/uL   RBC 3.74 (L) 4.22 - 5.81 MIL/uL   Hemoglobin 10.8 (L) 13.0 - 17.0 g/dL   HCT 25.4 (L) 27.0 - 62.3 %   MCV 87.2 80.0 - 100.0 fL   MCH 28.9 26.0 - 34.0 pg   MCHC 33.1 30.0 - 36.0 g/dL   RDW 76.2 83.1 - 51.7 %   Platelets 275 150 - 400 K/uL   nRBC 0.0 0.0 - 0.2 %   Neutrophils Relative % 60  %   Neutro Abs 5.4 1.7 - 7.7 K/uL   Lymphocytes Relative 28 %   Lymphs Abs 2.5 0.7 - 4.0 K/uL   Monocytes Relative 8 %   Monocytes Absolute 0.7 0.1 - 1.0 K/uL   Eosinophils Relative 3 %   Eosinophils Absolute 0.3 0.0 - 0.5 K/uL   Basophils Relative 1 %   Basophils Absolute 0.1 0.0 - 0.1 K/uL   Immature Granulocytes 0 %   Abs Immature Granulocytes 0.03 0.00 - 0.07 K/uL  Basic metabolic panel     Status: Abnormal   Collection Time: 01/28/22  3:19 AM  Result Value Ref Range   Sodium 137 135 - 145 mmol/L   Potassium 3.4 (L) 3.5 - 5.1 mmol/L   Chloride 101 98 - 111 mmol/L   CO2 28 22 - 32 mmol/L   Glucose, Bld 178 (H) 70 - 99 mg/dL   BUN <5 (L) 6 - 20 mg/dL   Creatinine, Ser 6.16 0.61 - 1.24 mg/dL   Calcium 8.1 (L) 8.9 - 10.3 mg/dL   GFR, Estimated >07 >37 mL/min   Anion gap 8 5 - 15  Glucose, capillary     Status: Abnormal   Collection Time: 01/28/22  8:25 AM  Result  Value Ref Range   Glucose-Capillary 176 (H) 70 - 99 mg/dL  Glucose, capillary     Status: Abnormal   Collection Time: 01/28/22 11:45 AM  Result Value Ref Range   Glucose-Capillary 111 (H) 70 - 99 mg/dL    ASSESSMENT: Joseph [name] is a 51 y.o. male, 2 Days Post-Op s/p INCISION AND DRAINAGE ABSCESS POST OP RIGHT KNEE ARTHROSCOPY  PLAN: Weightbearing: WBAT RLE ROM: Unrestricted knee motion as tolerated Incisional and dressing care: Dressing changed 2/27, drains removed. Showering: Hold off for now Orthopedic device(s): None  Pain management:  1. Gabapentin 600 mg BID 2. Flexeril 10 mg BID PRN 3. Oxycodone 5-10 mg q 4 hours PRN 4. Dilaudid 1 mg q 3 hours PRN 5.  Nucynta 75 mg TID PRN VTE prophylaxis: okay to resume xarelto 20mg  2/28, SCDs ID: Cefepime and vancomycin per ID team Foley/Lines:  No foley, KVO IVFs Dispo: Therapies as tolerated.  Continue to monitor intra-op cultures, appreciate ID assistance with antibiotic regimen.    Follow - up plan: Follow up 1 week post discharge    Terra Aveni A  Gwendalyn Mcgonagle 01/28/2022, 3:18 PM   01/30/2022, MD  Contact information:   [phone] 7am-5pm epic message Dr. [email or web address], or call office for patient follow up: [phone] After hours and holidays please check [email or web address] for group call information for [facility]

## 2022-01-28 NOTE — Plan of Care (Signed)
  Problem: Health Behavior/Discharge Planning: Goal: Ability to manage health-related needs will improve Outcome: Progressing   Problem: Clinical Measurements: Goal: Ability to maintain clinical measurements within normal limits will improve Outcome: Progressing   Problem: Coping: Goal: Level of anxiety will decrease Outcome: Progressing   Problem: Pain Managment: Goal: General experience of comfort will improve Outcome: Progressing   

## 2022-01-28 NOTE — Evaluation (Signed)
Physical Therapy Evaluation Patient Details Name: Joseph [name] MRN: [id number] DOB: [birth year] Today's Date: 01/28/2022  History of Present Illness  Patient is a 51 y/o male who presents on 01/25/22 with right septic knee. US-guided aspiration of the calf was performed and 10cc of frank pus removed 01/25/22. Underwent I&D of right calf abscess and right knee joint with partial synovectomy 01/26/22. Multiple surgeries/infections of right knee since original surgery on 12/20/21. PMH includes obesity, sleep apnea, DM, diastolic CHF, COPD, DVT, anxiety, chronic back pain, chronic left knee pain on opioids.  Clinical Impression  Patient presents with decreased AROM/pain of RLE and impaired mobility s/p above. Pt has been mainly using w/c vs crutches vs SW for mobility at home in the last few months due to pain and inability to place weight through RLE. Right knee positioned into ~50 degrees knee flexion. Today, despite being medicated with IV dilaudid during session, pt not able to bear any weight through RLE in standing or initiate gait however able to hop using RW for support. Education on positioning to favor knee extension, no pillows under knee, ankle AROM/stretches, WB exercises etc. If pt not able to regain ROM, recommend OPPT as pt highly motivated to regain function and return to work. Will follow acutely.       Recommendations for follow up therapy are one component of a multi-disciplinary discharge planning process, led by the attending physician.  Recommendations may be updated based on patient status, additional functional criteria and insurance authorization.  Follow Up Recommendations No PT follow up    Assistance Recommended at Discharge PRN  Patient can return home with the following  Help with stairs or ramp for entrance;A little help with walking and/or transfers;Assistance with cooking/housework    Equipment Recommendations None recommended by PT  Recommendations for Other Services        Functional Status Assessment Patient has had a recent decline in their functional status and demonstrates the ability to make significant improvements in function in a reasonable and predictable amount of time.     Precautions / Restrictions Precautions Precautions: Knee;Fall Precaution Booklet Issued: No Precaution Comments: Reviewed no pillow under knee and positioning for knee extension Restrictions Weight Bearing Restrictions: Yes RLE Weight Bearing: Weight bearing as tolerated Other Position/Activity Restrictions: unrestriced AROM of right knee      Mobility  Bed Mobility Overal bed mobility: Modified Independent             General bed mobility comments: No assist needed.    Transfers Overall transfer level: Modified independent Equipment used: Rolling walker (2 wheels)               General transfer comment: Stood from EOB x2, good demo of hand placement. Concerned that this walker has wheels on it.    Ambulation/Gait Ambulation/Gait assistance: Supervision Gait Distance (Feet): 15 Feet Assistive device: Rolling walker (2 wheels) Gait Pattern/deviations: Step-to pattern     Pre-gait activities: Attempted placing right foot on floor and attempting some light WB in RW however pt with stabbing pain on anterior knee resulting in need to sit General Gait Details: "hop to" gait with use of RW, picking it up which is what he is used to at home with a SW. No WB through RLE due to pain.  Stairs            Wheelchair Mobility    Modified Rankin (Stroke Patients Only)       Balance Overall balance assessment: Needs assistance Sitting-balance support:  Feet supported, No upper extremity supported Sitting balance-Leahy Scale: Good     Standing balance support: During functional activity Standing balance-Leahy Scale: Fair Standing balance comment: Able to stand statically without UE support, needs UE support for walking due to inability to bear  weight through RLE.                             Pertinent Vitals/Pain Pain Assessment Pain Assessment: Faces Faces Pain Scale: Hurts worst Pain Location: right knee esp with weight bearing Pain Descriptors / Indicators: Grimacing, Guarding, Sore, Discomfort Pain Intervention(s): Monitored during session, RN gave pain meds during session, Limited activity within patient's tolerance, Repositioned    Home Living Family/patient expects to be discharged to:: Private residence Living Arrangements: Spouse/significant other Available Help at Discharge: Family Type of Home: House Home Access: Stairs to enter Entrance Stairs-Rails: Right Entrance Stairs-Number of Steps: 3   Home Layout: Able to live on main level with bedroom/bathroom;Two level Home Equipment: Agricultural consultant (2 wheels);BSC/3in1;Wheelchair - manual;Crutches      Prior Function Prior Level of Function : Needs assist             Mobility Comments: Using w/c for mobility recently and crutches vs RW at times hopping on LLE.       Hand Dominance        Extremity/Trunk Assessment   Upper Extremity Assessment Upper Extremity Assessment: Defer to OT evaluation    Lower Extremity Assessment Lower Extremity Assessment: RLE deficits/detail RLE Deficits / Details: Swelling in foot, good AROM at toes/ankle, knee positioned in ~50 degrees knee flexion, unable to extend due to pain RLE Sensation: WNL       Communication   Communication: No difficulties  Cognition Arousal/Alertness: Awake/alert Behavior During Therapy: WFL for tasks assessed/performed Overall Cognitive Status: Within Functional Limits for tasks assessed                                          General Comments General comments (skin integrity, edema, etc.): Wife present during session.    Exercises     Assessment/Plan    PT Assessment Patient needs continued PT services  PT Problem List Decreased range of  motion;Decreased mobility;Pain;Decreased activity tolerance;Decreased skin integrity       PT Treatment Interventions Therapeutic exercise;Gait training;Stair training;Patient/family education;Therapeutic activities;Functional mobility training    PT Goals (Current goals can be found in the Care Plan section)  Acute Rehab PT Goals Patient Stated Goal: be able to walk and get back to work PT Goal Formulation: With patient Time For Goal Achievement: 02/11/22 Potential to Achieve Goals: Good    Frequency Min 3X/week     Co-evaluation               AM-PAC PT "6 Clicks" Mobility  Outcome Measure Help needed turning from your back to your side while in a flat bed without using bedrails?: None Help needed moving from lying on your back to sitting on the side of a flat bed without using bedrails?: None Help needed moving to and from a bed to a chair (including a wheelchair)?: A Little Help needed standing up from a chair using your arms (e.g., wheelchair or bedside chair)?: A Little Help needed to walk in hospital room?: A Little Help needed climbing 3-5 steps with a railing? : A Little 6 Click Score:  20    End of Session   Activity Tolerance: Patient limited by pain Patient left: in bed;with call bell/phone within reach;with family/visitor present Nurse Communication: Mobility status;Patient requests pain meds PT Visit Diagnosis: Pain;Other abnormalities of gait and mobility (R26.89) Pain - Right/Left: Right Pain - part of body: Knee;Leg    Time: 1761-6073 PT Time Calculation (min) (ACUTE ONLY): 20 min   Charges:   PT Evaluation $PT Eval Moderate Complexity: 1 Mod          Vale Haven, PT, DPT Acute Rehabilitation Services Pager [phone] Office [phone]     Blake Divine A Lanier Ensign 01/28/2022, 3:43 PM

## 2022-01-28 NOTE — Progress Notes (Signed)
Inpatient Diabetes Program Recommendations  AACE/ADA: New Consensus Statement on Inpatient Glycemic Control (2015)  Target Ranges:  Prepandial:   less than 140 mg/dL      Peak postprandial:   less than 180 mg/dL (1-2 hours)      Critically ill patients:  140 - 180 mg/dL   Lab Results  Component Value Date   GLUCAP 227 (H) 01/28/2022   HGBA1C 7.5 (H) 01/26/2022   Diabetes history: Type 2 DM Outpatient Diabetes medications: Tresiba 35 units QD, Metformin 1000 mg BID, Glipizide 10 mg QD, Jardiance 10 mg QD Current orders for Inpatient glycemic control: Semglee 30 units QD, Glipizide 10 mg QD, Jardiance 10 mg QD, Novolog 0-20 units TID  Inpatient Diabetes Program Recommendations:    Spoke with patient regarding outpatient diabetes management. Patient has made improvement to A1C over the last year.  Reviewed patient's current A1c of 7.5%. Explained what a A1c is and what it measures. Also reviewed goal A1c with patient, importance of good glucose control @ home, and blood sugar goals. Reviewed patho of DM, need for improved control, vascular changes, impact of infection risk, and commorbidities.  Patient wears freestyle libre and knows when to call MD.  No further questions at this time.   Thanks, Lujean Rave, MSN, RNC-OB Diabetes Coordinator [phone] (8a-5p)

## 2022-01-28 NOTE — Care Management Important Message (Signed)
Important Message  Patient Details  Name: Joseph [name] MRN: [id number] Date of Birth: [birth year]   Medicare Important Message Given:  Yes     [name] 01/28/2022, 12:24 PM

## 2022-01-29 DIAGNOSIS — L02419 Cutaneous abscess of limb, unspecified: Secondary | ICD-10-CM

## 2022-01-29 DIAGNOSIS — I824Z1 Acute embolism and thrombosis of unspecified deep veins of right distal lower extremity: Secondary | ICD-10-CM | POA: Diagnosis not present

## 2022-01-29 DIAGNOSIS — M00061 Staphylococcal arthritis, right knee: Secondary | ICD-10-CM | POA: Diagnosis not present

## 2022-01-29 DIAGNOSIS — M009 Pyogenic arthritis, unspecified: Secondary | ICD-10-CM | POA: Diagnosis not present

## 2022-01-29 LAB — CBC WITH DIFFERENTIAL/PLATELET
Abs Immature Granulocytes: 0.04 10*3/uL (ref 0.00–0.07)
Basophils Absolute: 0.1 10*3/uL (ref 0.0–0.1)
Basophils Relative: 1 %
Eosinophils Absolute: 0.4 10*3/uL (ref 0.0–0.5)
Eosinophils Relative: 4 %
HCT: 33.8 % — ABNORMAL LOW (ref 39.0–52.0)
Hemoglobin: 11.2 g/dL — ABNORMAL LOW (ref 13.0–17.0)
Immature Granulocytes: 1 %
Lymphocytes Relative: 27 %
Lymphs Abs: 2.4 10*3/uL (ref 0.7–4.0)
MCH: 29.1 pg (ref 26.0–34.0)
MCHC: 33.1 g/dL (ref 30.0–36.0)
MCV: 87.8 fL (ref 80.0–100.0)
Monocytes Absolute: 0.7 10*3/uL (ref 0.1–1.0)
Monocytes Relative: 8 %
Neutro Abs: 5.3 10*3/uL (ref 1.7–7.7)
Neutrophils Relative %: 59 %
Platelets: 274 10*3/uL (ref 150–400)
RBC: 3.85 MIL/uL — ABNORMAL LOW (ref 4.22–5.81)
RDW: 13.9 % (ref 11.5–15.5)
WBC: 8.9 10*3/uL (ref 4.0–10.5)
nRBC: 0 % (ref 0.0–0.2)

## 2022-01-29 LAB — CK: Total CK: 43 U/L — ABNORMAL LOW (ref 49–397)

## 2022-01-29 LAB — BASIC METABOLIC PANEL
Anion gap: 9 (ref 5–15)
BUN: <5 mg/dL — ABNORMAL LOW (ref 6–20)
CO2: 27 mmol/L (ref 22–32)
Calcium: 8.5 mg/dL — ABNORMAL LOW (ref 8.9–10.3)
Chloride: 101 mmol/L (ref 98–111)
Creatinine, Ser: 0.57 mg/dL — ABNORMAL LOW (ref 0.61–1.24)
GFR, Estimated: >60 mL/min (ref >60)
Glucose, Bld: 121 mg/dL — ABNORMAL HIGH (ref 70–99)
Potassium: 3.7 mmol/L (ref 3.5–5.1)
Sodium: 137 mmol/L (ref 135–145)

## 2022-01-29 LAB — GLUCOSE, CAPILLARY
Glucose-Capillary: 221 mg/dL — ABNORMAL HIGH (ref 70–99)
Glucose-Capillary: 173 mg/dL — ABNORMAL HIGH (ref 70–99)
Glucose-Capillary: 163 mg/dL — ABNORMAL HIGH (ref 70–99)
Glucose-Capillary: 237 mg/dL — ABNORMAL HIGH (ref 70–99)

## 2022-01-29 MED ORDER — INSULIN GLARGINE-YFGN 100 UNIT/ML ~~LOC~~ SOLN
35.0000 [IU] | Freq: Every day | SUBCUTANEOUS | Status: DC
  Administered 2022-01-30: 35 [IU] via SUBCUTANEOUS
  Filled 2022-01-29: qty 0.35

## 2022-01-29 MED ORDER — SODIUM CHLORIDE 0.9 % IV SOLN
8.0000 mg/kg | Freq: Every day | INTRAVENOUS | Status: DC
  Administered 2022-01-29 – 2022-01-30 (×2): 700 mg via INTRAVENOUS
  Filled 2022-01-29 (×2): qty 14

## 2022-01-29 MED ORDER — BISACODYL 10 MG RE SUPP: 10.0000 mg | Freq: Every day | RECTAL | Status: DC | PRN

## 2022-01-29 MED ORDER — OXYCODONE HCL 10 MG PO TABS: 10.0000 mg | ORAL_TABLET | ORAL | 0 refills | Status: AC | PRN

## 2022-01-29 NOTE — Progress Notes (Signed)
PHARMACY CONSULT NOTE FOR:  OUTPATIENT  PARENTERAL ANTIBIOTIC THERAPY (OPAT)  Indication: R-knee PJI Regimen: Daptomycin 700 mg IV every 24 hours and Cefepime 2g IV every 8 hours End date: 03/09/22  IV antibiotic discharge orders are pended. To discharging provider:  please sign these orders via discharge navigator,  Select New Orders & click on the button choice - Manage This Unsigned Work.     Thank you for allowing pharmacy to be a part of this patient's care.  Joseph [name] 01/29/2022, 11:14 AM

## 2022-01-29 NOTE — Progress Notes (Signed)
Pt places self on/off cpap when needed.  RT will cont to monitor

## 2022-01-29 NOTE — Progress Notes (Signed)
Physical Therapy Treatment Patient Details Name: Joseph [name] MRN: [id number] DOB: 08-05-1971 Today's Date: 01/29/2022   History of Present Illness Patient is a 51 y/o male who presents on 01/25/22 with right septic knee. US-guided aspiration of the calf was performed and 10cc of frank pus removed 01/25/22. Underwent I&D of right calf abscess and right knee joint with partial synovectomy 01/26/22. Multiple surgeries/infections of right knee since original surgery on 12/20/21. PMH includes obesity, sleep apnea, DM, diastolic CHF, COPD, DVT, anxiety, chronic back pain, chronic left knee pain on opioids.    PT Comments    Pt received supine and agreeable to session with slow but steady progression towards goals. Pt able to bear some weight through RLE this session, taking ~6 steps away from EOB with RW, with cues for sequencing, with essentially TWB and min guard needed.  Encouraged pt to accept as much weight as tolerated through RLE but pain continues to be limiting factor. Pt very motivated to mobilize and regain PLF. Reinforced education on knee positioning to favor extension and nuetral IR/ER, not placing pillow under knee, ankle AROM, knee exercises, and icing. Pt continues to benefit from skilled PT services to progress toward functional mobility goals.    Recommendations for follow up therapy are one component of a multi-disciplinary discharge planning process, led by the attending physician.  Recommendations may be updated based on patient status, additional functional criteria and insurance authorization.  Follow Up Recommendations  No PT follow up     Assistance Recommended at Discharge PRN  Patient can return home with the following Help with stairs or ramp for entrance;A little help with walking and/or transfers;Assistance with cooking/housework   Equipment Recommendations  None recommended by PT    Recommendations for Other Services       Precautions / Restrictions  Precautions Precautions: Knee;Fall Precaution Booklet Issued: No Precaution Comments: Reviewed no pillow under knee and positioning for knee extension and avoiding resting in ER Restrictions Weight Bearing Restrictions: Yes RLE Weight Bearing: Weight bearing as tolerated Other Position/Activity Restrictions: unrestriced AROM of right knee     Mobility  Bed Mobility Overal bed mobility: Modified Independent             General bed mobility comments: No physical assist needed.    Transfers Overall transfer level: Modified independent Equipment used: Rolling walker (2 wheels)       General transfer comment: good demo of hand placement.    Ambulation/Gait Ambulation/Gait assistance: Min guard, Min assist (min assist for cueing) Gait Distance (Feet): 10 Feet Assistive device: Rolling walker (2 wheels) Gait Pattern/deviations: Step-to pattern, Decreased step length - right, Decreased weight shift to right, Antalgic     Pre-gait activities: R/L lateral weight shifting standing EOB General Gait Details: pt able to accept some weight through RLE for ~6 steps away from EOB. hop-to gait back to bed d/t substantal pain. cues for sequencing   Stairs             Wheelchair Mobility    Modified Rankin (Stroke Patients Only)       Balance Overall balance assessment: Needs assistance Sitting-balance support: Feet supported, No upper extremity supported Sitting balance-Leahy Scale: Good     Standing balance support: During functional activity Standing balance-Leahy Scale: Fair Standing balance comment: Able to stand statically without UE support, needs UE support for walking due to inability to bear weight through RLE.  Cognition Arousal/Alertness: Awake/alert Behavior During Therapy: WFL for tasks assessed/performed Overall Cognitive Status: Within Functional Limits for tasks assessed                                  General Comments: pt very motivated to get back to PLF        Exercises General Exercises - Lower Extremity Ankle Circles/Pumps: AROM, Both, 20 reps, Supine Quad Sets: AROM, Right, 10 reps, Supine Long Arc Quad: AROM, Right, 5 reps, Seated Toe Raises: AROM, Both, 10 reps, Seated Heel Raises: AROM, Both, 10 reps, Seated    General Comments        Pertinent Vitals/Pain Pain Assessment Pain Assessment: 0-10 Pain Score: 7  Pain Location: right knee esp with weight bearing up to >10 with WB Pain Descriptors / Indicators: Grimacing, Guarding, Sore, Discomfort Pain Intervention(s): Limited activity within patient's tolerance, Monitored during session, Repositioned, Premedicated before session    Home Living                          Prior Function            PT Goals (current goals can now be found in the care plan section) Acute Rehab PT Goals Time For Goal Achievement: 02/11/22 Potential to Achieve Goals: Good    Frequency    Min 3X/week      PT Plan      Co-evaluation              AM-PAC PT "6 Clicks" Mobility   Outcome Measure  Help needed turning from your back to your side while in a flat bed without using bedrails?: None Help needed moving from lying on your back to sitting on the side of a flat bed without using bedrails?: None Help needed moving to and from a bed to a chair (including a wheelchair)?: A Little Help needed standing up from a chair using your arms (e.g., wheelchair or bedside chair)?: A Little Help needed to walk in hospital room?: A Little Help needed climbing 3-5 steps with a railing? : A Little 6 Click Score: 20    End of Session Equipment Utilized During Treatment: Gait belt Activity Tolerance: Patient limited by pain Patient left: in bed;with call bell/phone within reach;with bed alarm set Nurse Communication: Mobility status PT Visit Diagnosis: Pain;Other abnormalities of gait and mobility (R26.89) Pain -  Right/Left: Right Pain - part of body: Knee;Leg     Time: 3545-6256 PT Time Calculation (min) (ACUTE ONLY): 24 min  Charges:  $Gait Training: 23-37 mins                     Tiron Suski R. PTA Acute Rehabilitation Services Office: [phone]     Catalina Antigua 01/29/2022, 9:41 AM

## 2022-01-29 NOTE — Progress Notes (Signed)
[facility] Infectious Diseases Follow Up Note  Patient Identification: Patient Name: Joseph [name] MRN: [id number] [place] Date: 01/25/2022  2:52 PM Age: 51 y.o.Today's Date: 01/29/2022   Reason for Visit: PJI   Principal Problem:   Septic arthritis of knee, right ([place]) Active Problems:   Cigarette smoker   Chronic pain syndrome   Type 2 diabetes mellitus with hyperglycemia (HCC)   OSA (obstructive sleep apnea)   Class 1 obesity due to excess calories with body mass index (BMI) of 33.0 to 33.9 in adult   Anxiety and depression   COPD without exacerbation (HCC)   Dyslipidemia   Opioid-induced constipation ([facility])   DVT (deep venous thrombosis) (HCC)   Antibiotics:  Cefepime 2/24-c  Vancomycin 2/24-c  Lines/Hardwares: PICC rt arm, RT knee hemovac  Interval Events: continues to be afebrile, no leukocytosis   Assessment Recurrent postoperative right knee septic arthritis with right calf abscess - Right-sided medial and lateral meniscectomy and chondroplasty in 12/20/21 followed by post operative infection. Knee was aspirated in the clinic Dr. Percell Miller and [phone] cells were encountered along which were [id number] neutrophilic. Cx aspirate grew Serratia marcescens. Underwent arthroscopic I&D on 01/02/2022 ( cx Serratia marcescens). Discharged On Bactrim DS plus Ciprofloxacin until seen in the ID clinic 2/10. Started on ceftriaxone 2 g IV daily for 6 weeks from 2/14 - Readmitted 2/24 for increased pain and swelling of the rt knee and calf. US guided exam with possible abscess in the rt calf. MRI of the right knee and right lower leg were obtained large posterior calf abscess was identified.  The knee also had a large complex effusion.  S/p I and D of R calf abscess  and rt knee joint with partial synovectomy  2. Rt calf DVT   Recommendations Switch Vancomycin to Daptomycin 8mg /kg  and cefepime as is Fu OR cultures  Planned for 6 weeks of IV  abtx from OR date Fu OR dates to completion  Monitor CBC and BMP, CPK ID pharmacy to place OPAT orders  Rest of the management as per the primary team. Thank you for the consult. Please page with pertinent questions or concerns.  ______________________________________________________________________ Subjective patient seen and examined at the bedside. Soreness in the left leg and calf  Vitals BP (!) 132/91 (BP Location: Left Arm)    Pulse 89    Temp 98.6 F (37 C) (Oral)    Resp 18    Ht 5\' 10"  (1.778 m)    Wt 106.1 kg    SpO2 95%    BMI 33.56 kg/m     Physical Exam Constitutional:  sitting up in the bed     Comments:   Cardiovascular:     Rate and Rhythm: Normal rate and regular rhythm.     Heart sounds:  Pulmonary:     Effort: Pulmonary effort is normal.     Comments:   Abdominal:     Palpations: Abdomen is soft.     Tenderness: Non distended   Musculoskeletal:        General: Left leg is bandaged   Skin:    Comments:   Neurological:     General: Grossly non focal, awake, alert and oriented   Psychiatric:        Mood and Affect: Mood normal.   Pertinent Microbiology Results for orders placed or performed during the hospital encounter of 01/25/22  Resp Panel by RT-PCR (Flu A&B, Covid) Nasopharyngeal Swab     Status: None   Collection Time: 01/25/22  3:23 PM   Specimen: Nasopharyngeal Swab; Nasopharyngeal(NP) swabs in vial transport medium  Result Value Ref Range Status   SARS Coronavirus 2 by RT PCR NEGATIVE NEGATIVE Final    Comment: (NOTE) SARS-CoV-2 target nucleic acids are NOT DETECTED.  The SARS-CoV-2 RNA is generally detectable in upper respiratory specimens during the acute phase of infection. The lowest concentration of SARS-CoV-2 viral copies this assay can detect is 138 copies/mL. A negative result does not preclude SARS-Cov-2 infection and should not be used as the sole basis for treatment or other patient management decisions. A negative  result may occur with  improper specimen collection/handling, submission of specimen other than nasopharyngeal swab, presence of viral mutation(s) within the areas targeted by this assay, and inadequate number of viral copies(<138 copies/mL). A negative result must be combined with clinical observations, patient history, and epidemiological information. The expected result is Negative.  Fact Sheet for Patients:  EntrepreneurPulse.com.au  Fact Sheet for Healthcare Providers:  IncredibleEmployment.be  This test is no t yet approved or cleared by the [place] FDA and  has been authorized for detection and/or diagnosis of SARS-CoV-2 by FDA under an Emergency Use Authorization (EUA). This EUA will remain  in effect (meaning this test can be used) for the duration of the COVID-19 declaration under Section 564(b)(1) of the Act, 21 U.S.C.section 360bbb-3(b)(1), unless the authorization is terminated  or revoked sooner.       Influenza A by PCR NEGATIVE NEGATIVE Final   Influenza B by PCR NEGATIVE NEGATIVE Final    Comment: (NOTE) The Xpert Xpress SARS-CoV-2/FLU/RSV plus assay is intended as an aid in the diagnosis of influenza from Nasopharyngeal swab specimens and should not be used as a sole basis for treatment. Nasal washings and aspirates are unacceptable for Xpert Xpress SARS-CoV-2/FLU/RSV testing.  Fact Sheet for Patients: EntrepreneurPulse.com.au  Fact Sheet for Healthcare Providers: IncredibleEmployment.be  This test is not yet approved or cleared by the [place] FDA and has been authorized for detection and/or diagnosis of SARS-CoV-2 by FDA under an Emergency Use Authorization (EUA). This EUA will remain in effect (meaning this test can be used) for the duration of the COVID-19 declaration under Section 564(b)(1) of the Act, 21 U.S.C. section 360bbb-3(b)(1), unless the authorization is  terminated or revoked.  Performed at [facility], [address] [place], [place] [address]   Culture, blood (routine x 2)     Status: None (Preliminary result)   Collection Time: 01/25/22  3:30 PM   Specimen: BLOOD  Result Value Ref Range Status   Specimen Description   Final    BLOOD BLOOD LEFT FOREARM Performed at [facility], [address], [place], [place] [address]    Special Requests   Final    BOTTLES DRAWN AEROBIC AND ANAEROBIC Blood Culture adequate volume Performed at [facility], [address], [place], [place] [address]    Culture   Final    NO GROWTH 4 DAYS Performed at [facility], [place] [address] [place], [place] [address]    Report Status PENDING  Incomplete  Culture, blood (routine x 2)     Status: None (Preliminary result)   Collection Time: 01/25/22  3:35 PM   Specimen: BLOOD  Result Value Ref Range Status   Specimen Description   Final    BLOOD BLOOD RIGHT WRIST Performed at [facility], [address], [place], [place] [address]    Special Requests   Final    BOTTLES DRAWN  AEROBIC AND ANAEROBIC Blood Culture adequate volume Performed at [facility], [address], [place], [place] [address]    Culture   Final    NO GROWTH 4 DAYS Performed at [facility], [place] [address] [place], [place] [address]    Report Status PENDING  Incomplete  Surgical PCR screen     Status: None   Collection Time: 01/25/22 11:08 PM   Specimen: Nasal Mucosa; Nasal Swab  Result Value Ref Range Status   MRSA, PCR NEGATIVE NEGATIVE Final   Staphylococcus aureus NEGATIVE NEGATIVE Final    Comment: (NOTE) The Xpert SA Assay (FDA approved for NASAL specimens in patients 30 years of age and older), is one component of a comprehensive surveillance program. It is not intended to diagnose infection nor to guide or monitor treatment. Performed at [facility], [place] [address], [place], [place] [address]   Aerobic/Anaerobic Culture w Gram Stain (surgical/deep wound)     Status: None (Preliminary result)   Collection Time: 01/26/22  8:03 AM   Specimen: PATH Other; Tissue  Result Value Ref Range Status   Specimen Description TISSUE  Final   Special Requests RIGHT KNEE ABSC SPEC A  Final   Gram Stain   Final    MODERATE WBC PRESENT,BOTH PMN AND MONONUCLEAR NO ORGANISMS SEEN    Culture   Final    NO GROWTH 3 DAYS NO ANAEROBES ISOLATED; CULTURE IN PROGRESS FOR 5 DAYS Performed at [facility], [place] [address], [place], [place] [address]    Report Status PENDING  Incomplete  Aerobic/Anaerobic Culture w Gram Stain (surgical/deep wound)     Status: None (Preliminary result)   Collection Time: 01/26/22  8:07 AM   Specimen: PATH Other; Tissue  Result Value Ref Range Status   Specimen Description TISSUE  Final   Special Requests RIGHT CALF INFECTION SPEC B  Final   Gram Stain   Final    ABUNDANT WBC PRESENT,BOTH PMN AND MONONUCLEAR NO ORGANISMS SEEN    Culture   Final    NO GROWTH 3 DAYS NO ANAEROBES ISOLATED; CULTURE IN PROGRESS FOR 5 DAYS Performed at [facility], [place] [address] [place], [place] [address]    Report Status PENDING  Incomplete  Aerobic/Anaerobic Culture w Gram Stain (surgical/deep wound)     Status: None (Preliminary result)   Collection Time: 01/26/22  8:24 AM   Specimen: PATH Other; Tissue  Result Value Ref Range Status   Specimen Description TISSUE  Final   Special Requests RIGHT KNEE ABSC SPEC C  Final   Gram Stain   Final    ABUNDANT WBC PRESENT, PREDOMINANTLY PMN RARE GRAM POSITIVE COCCI    Culture   Final    NO GROWTH 3 DAYS NO ANAEROBES ISOLATED; CULTURE IN PROGRESS FOR 5 DAYS Performed at [facility], [address], [place], [place] [address]    Report Status PENDING  Incomplete  Aerobic/Anaerobic Culture w Gram Stain (surgical/deep wound)     Status: None (Preliminary result)    Collection Time: 01/26/22  8:24 AM   Specimen: PATH Other; Tissue  Result Value Ref Range Status   Specimen Description ABSCESS  Final   Special Requests RIGHT CALF ABSC SPEC D  Final   Gram Stain   Final    ABUNDANT WBC PRESENT,BOTH PMN AND MONONUCLEAR NO ORGANISMS SEEN    Culture   Final    NO GROWTH 3 DAYS NO ANAEROBES ISOLATED; CULTURE IN  PROGRESS FOR 5 DAYS Performed at [facility], [place] [address], [place], [place] [address]    Report Status PENDING  Incomplete    Pertinent Lab. CBC Latest Ref Rng & Units 01/29/2022 01/28/2022 01/26/2022  WBC 4.0 - 10.5 K/uL 8.9 9.0 10.5  Hemoglobin 13.0 - 17.0 g/dL 11.2(L) 10.8(L) 11.8(L)  Hematocrit 39.0 - 52.0 % 33.8(L) 32.6(L) 36.4(L)  Platelets 150 - 400 K/uL 274 275 291   CMP Latest Ref Rng & Units 01/29/2022 01/28/2022 01/26/2022  Glucose 70 - 99 mg/dL 121(H) 178(H) 211(H)  BUN 6 - 20 mg/dL <5(L) <5(L) 6  Creatinine 0.61 - 1.24 mg/dL 0.57(L) 0.62 0.66  Sodium 135 - 145 mmol/L 137 137 137  Potassium 3.5 - 5.1 mmol/L 3.7 3.4(L) 4.0  Chloride 98 - 111 mmol/L 101 101 101  CO2 22 - 32 mmol/L 27 28 27   Calcium 8.9 - 10.3 mg/dL 8.5(L) 8.1(L) 8.8(L)  Total Protein 6.0 - 8.5 g/dL - - -  Total Bilirubin 0.0 - 1.2 mg/dL - - -  Alkaline Phos 44 - 121 IU/L - - -  AST 0 - 40 IU/L - - -  ALT 0 - 44 IU/L - - -     Pertinent Imaging today Plain films and CT images have been personally visualized and interpreted; radiology reports have been reviewed. Decision making incorporated into the Impression / Recommendations. MR TIBIA FIBULA RIGHT WO CONTRAST  Result Date: 01/26/2022 CLINICAL DATA:  Soft tissue infection. Patient has been on IV antibiotics. Evaluate for progression. EXAM: MRI OF THE RIGHT KNEE WITHOUT CONTRAST; MRI OF LOWER RIGHT EXTREMITY WITHOUT CONTRAST TECHNIQUE: Multiplanar, multisequence MR imaging of the knee was performed. No intravenous contrast was administered. Multiplanar, multisequence MR imaging of the right lower leg was  performed. No intravenous contrast was administered. COMPARISON:  10/26/2021. FINDINGS: MENISCI Medial: Severe attenuation of the posterior horn and body of the medial meniscus likely reflecting prior meniscectomy. Lateral: Intact. LIGAMENTS Cruciates: Complete ACL tear.  Intact PCL. Collaterals: Medial collateral ligament is intact. Lateral collateral ligament complex is intact. CARTILAGE Patellofemoral: Full-thickness cartilage loss of the patellar apex and medial patellar facet with subchondral reactive marrow edema. Partial-thickness cartilage loss of the lateral trochlea. Medial: Extensive full-thickness cartilage loss of the medial femorotibial compartment with severe subchondral marrow edema. Lateral: Partial-thickness cartilage loss of the lateral femorotibial compartment with mild subchondral marrow edema. JOINT: Large joint effusion with extensive synovitis. 8.8 x 3.6 x 12.8 cm complex fluid collection between the soleus muscle and gastrocnemius muscles which appears contiguous with the posterolateral joint space at the level of the musculotendinous junction of the popliteus with numerous low signal foci within the collection consistent with air. Normal Hoffa's fat-pad. No plical thickening. POPLITEAL FOSSA: Popliteus tendon is intact. No Baker's cyst. EXTENSOR MECHANISM: Intact quadriceps tendon. Intact patellar tendon. Intact lateral patellar retinaculum. Intact medial patellar retinaculum. Intact MPFL. BONES: No aggressive osseous lesion. No fracture or dislocation. Other: Soft tissue edema circumferentially around the knee and right lower leg concerning for cellulitis. Muscle edema in the popliteus muscle, inferior aspect of the medial gastrocnemius muscle, inferior aspect of the lateral gastrocnemius muscle and the anterior compartment musculature concerning for myositis. Small prepatellar fluid collection measuring 3.4 x 2 x 0.3 cm. IMPRESSION: 1. Large joint effusion with extensive synovitis  concerning for septic arthritis. 8.8 x 3.6 x 12.8 cm complex fluid collection between the soleus muscle and gastrocnemius muscles which appears contiguous with the posterolateral joint space at the level of the musculotendinous junction of  the popliteus with numerous low signal foci within the collection consistent with air concerning for an abscess. Subchondral bone marrow edema centered at the medial femorotibial compartment and to lesser extent lateral femorotibial compartment concerning for developing osteomyelitis versus reactive marrow edema from surgery and chondral loss. 2. Muscle edema in the popliteus muscle, inferior aspect of the medial gastrocnemius muscle, inferior aspect of the lateral gastrocnemius muscle and the anterior compartment musculature concerning for myositis. 3. Generalized soft tissue edema throughout the right lower leg and around the knee concerning for cellulitis. 4. Small prepatellar fluid collection measuring 3.4 x 2 x 0.3 cm likely reflecting a postoperative seroma or bursitis. 5. Tricompartmental cartilage abnormalities as described above most severe in the medial femorotibial compartment. 6. Severe attenuation of the posterior horn and body of the medial meniscus likely reflecting prior meniscectomy. 7. Complete tear of the ACL. Electronically Signed   By: Kathreen Devoid M.D.   On: 01/26/2022 09:27   MR KNEE RIGHT WO CONTRAST  Result Date: 01/26/2022 CLINICAL DATA:  Soft tissue infection. Patient has been on IV antibiotics. Evaluate for progression. EXAM: MRI OF THE RIGHT KNEE WITHOUT CONTRAST; MRI OF LOWER RIGHT EXTREMITY WITHOUT CONTRAST TECHNIQUE: Multiplanar, multisequence MR imaging of the knee was performed. No intravenous contrast was administered. Multiplanar, multisequence MR imaging of the right lower leg was performed. No intravenous contrast was administered. COMPARISON:  10/26/2021. FINDINGS: MENISCI Medial: Severe attenuation of the posterior horn and body of the  medial meniscus likely reflecting prior meniscectomy. Lateral: Intact. LIGAMENTS Cruciates: Complete ACL tear.  Intact PCL. Collaterals: Medial collateral ligament is intact. Lateral collateral ligament complex is intact. CARTILAGE Patellofemoral: Full-thickness cartilage loss of the patellar apex and medial patellar facet with subchondral reactive marrow edema. Partial-thickness cartilage loss of the lateral trochlea. Medial: Extensive full-thickness cartilage loss of the medial femorotibial compartment with severe subchondral marrow edema. Lateral: Partial-thickness cartilage loss of the lateral femorotibial compartment with mild subchondral marrow edema. JOINT: Large joint effusion with extensive synovitis. 8.8 x 3.6 x 12.8 cm complex fluid collection between the soleus muscle and gastrocnemius muscles which appears contiguous with the posterolateral joint space at the level of the musculotendinous junction of the popliteus with numerous low signal foci within the collection consistent with air. Normal Hoffa's fat-pad. No plical thickening. POPLITEAL FOSSA: Popliteus tendon is intact. No Baker's cyst. EXTENSOR MECHANISM: Intact quadriceps tendon. Intact patellar tendon. Intact lateral patellar retinaculum. Intact medial patellar retinaculum. Intact MPFL. BONES: No aggressive osseous lesion. No fracture or dislocation. Other: Soft tissue edema circumferentially around the knee and right lower leg concerning for cellulitis. Muscle edema in the popliteus muscle, inferior aspect of the medial gastrocnemius muscle, inferior aspect of the lateral gastrocnemius muscle and the anterior compartment musculature concerning for myositis. Small prepatellar fluid collection measuring 3.4 x 2 x 0.3 cm. IMPRESSION: 1. Large joint effusion with extensive synovitis concerning for septic arthritis. 8.8 x 3.6 x 12.8 cm complex fluid collection between the soleus muscle and gastrocnemius muscles which appears contiguous with the  posterolateral joint space at the level of the musculotendinous junction of the popliteus with numerous low signal foci within the collection consistent with air concerning for an abscess. Subchondral bone marrow edema centered at the medial femorotibial compartment and to lesser extent lateral femorotibial compartment concerning for developing osteomyelitis versus reactive marrow edema from surgery and chondral loss. 2. Muscle edema in the popliteus muscle, inferior aspect of the medial gastrocnemius muscle, inferior aspect of the lateral gastrocnemius muscle and the anterior compartment musculature  concerning for myositis. 3. Generalized soft tissue edema throughout the right lower leg and around the knee concerning for cellulitis. 4. Small prepatellar fluid collection measuring 3.4 x 2 x 0.3 cm likely reflecting a postoperative seroma or bursitis. 5. Tricompartmental cartilage abnormalities as described above most severe in the medial femorotibial compartment. 6. Severe attenuation of the posterior horn and body of the medial meniscus likely reflecting prior meniscectomy. 7. Complete tear of the ACL. Electronically Signed   By: Kathreen Devoid M.D.   On: 01/26/2022 09:27   VAS [place] LOWER EXTREMITY VENOUS (DVT)  Result Date: 01/04/2022  Lower Venous DVT Study Patient Name:  OLUWAJOMILOJU DELLAPORTA  Date of Exam:   01/04/2022 Medical Rec #: [id number]      Accession #:    [id number] Date of Birth: Jul 30, 1971      Patient Gender: M Patient Age:   40 years Exam Location:  [facility] Procedure:      VAS [place] LOWER EXTREMITY VENOUS (DVT) Referring Phys: Christia Reading MURPHY --------------------------------------------------------------------------------  Indications: Pain in the right lower extremity x 6 days, s/p right meniscus repair and infection. Patient denies any unusal SOB.  Risk Factors: Surgery to the right knee; meniscus repair on 12/20/2021. Right knee arthroscopic irrigation and debridement on 01/02/2022. Trauma to the knee in  October 2022. Comparison Study: NA Performing Technologist: Sharlett Iles RVT  Examination Guidelines: A complete evaluation includes B-mode imaging, spectral Doppler, color Doppler, and power Doppler as needed of all accessible portions of each vessel. Bilateral testing is considered an integral part of a complete examination. Limited examinations for reoccurring indications may be performed as noted. The reflux portion of the exam is performed with the patient in reverse Trendelenburg.  +---------+---------------+---------+-----------+---------------+--------------+  RIGHT     Compressibility Phasicity Spontaneity Properties      Thrombus Aging  +---------+---------------+---------+-----------+---------------+--------------+  CFV       Full            Yes       Yes                                         +---------+---------------+---------+-----------+---------------+--------------+  SFJ       Full            Yes       Yes                                         +---------+---------------+---------+-----------+---------------+--------------+  FV Prox   Full            Yes       Yes                                         +---------+---------------+---------+-----------+---------------+--------------+  FV Mid    Full                                                                  +---------+---------------+---------+-----------+---------------+--------------+  FV Distal Full  Yes       Yes                                         +---------+---------------+---------+-----------+---------------+--------------+  PFV       Full            Yes       Yes                                         +---------+---------------+---------+-----------+---------------+--------------+  POP       Full            Yes       Yes                                         +---------+---------------+---------+-----------+---------------+--------------+  PTV       Full                      No                                           +---------+---------------+---------+-----------+---------------+--------------+  PERO      Full                      No                                          +---------+---------------+---------+-----------+---------------+--------------+  Gastroc   None            No        No          softly          Acute                                                            echogenic and                                                                    dilated at .97                                                                   cm                              +---------+---------------+---------+-----------+---------------+--------------+  GSV       Full            Yes       Yes                                         +---------+---------------+---------+-----------+---------------+--------------+  SSV       Partial                   No          brightly        Chronic                                                          echogenic                       +---------+---------------+---------+-----------+---------------+--------------+  Right Technical Findings: Acute occlusive thrombus in the gastrocnemius veins, with dilatation, measuring .97 cm. Chronic non occlusive SVT in the proximal small saphenous vein. There is moderate interstitial fluid throughout the mid/distal posterior calf.  +----+---------------+---------+-----------+----------+--------------+  LEFT Compressibility Phasicity Spontaneity Properties Thrombus Aging  +----+---------------+---------+-----------+----------+--------------+  CFV  Full            Yes       Yes                                    +----+---------------+---------+-----------+----------+--------------+   Findings reported to [place] at 3:40 pm.  Summary: RIGHT: - Findings consistent with acute deep vein thrombosis involving the right gastrocnemius veins. - Findings consistent with chronic superficial vein thrombosis involving the right small saphenous vein. - No cystic structure  found in the popliteal fossa. - Acute occlusive thrombus in the gastrocnemius veins, with dilatation, measuring .97 cm.  LEFT: - No evidence of common femoral vein obstruction.  *See table(s) above for measurements and observations. Electronically signed by Kathlyn Sacramento MD on 01/04/2022 at 5:59:02 PM.    Final    IR PICC PLACEMENT RIGHT >5 YRS INC IMG GUIDE  Result Date: 01/15/2022 Pasty Spillers, [place]     01/15/2022 10:23 AM Successful placement of single lumen PICC line to right basilic vein. Length 41cm Tip at lower SVC/RA PICC capped No complications Ready for use. EBL < 5 mL Hayley Boisseau, PA-C 01/15/2022 10:22 AM      I spent more than 35 minutes for this patient encounter including review of prior medical records, coordination of care with primary/other specialist with greater than 50% of time being face to face/counseling and discussing diagnostics/treatment plan with the patient/family.  Electronically signed by:   Rosiland Oz, MD Infectious Disease Physician [facility] for Infectious Disease Pager: [phone]

## 2022-01-29 NOTE — Progress Notes (Signed)
Pharmacy Antibiotic Note  Joseph [name] is a 51 y.o. male admitted on 01/25/2022 with R-PJI infection.  Pharmacy has been consulted for Daptomycin and Cefepime dosing.   Plan: - Start Daptomycin 700 mg (~8 mg/kg AdjBW) every 24 hours - Continue statin per MD - monitor closely for myopathy - Continue Cefepime 2g IV every 8 hours - Will continue to follow renal function and culture results   Temp (24hrs), Avg:97.9 F (36.6 C), Min:97.6 F (36.4 C), Max:98.1 F (36.7 C)  Recent Labs  Lab 01/25/22 1523 01/26/22 1052 01/28/22 0319 01/29/22 0316  WBC 10.9* 10.5 9.0 8.9  CREATININE 0.69 0.66 0.62 0.57*    Estimated Creatinine Clearance: 133.2 mL/min (A) (by C-G formula based on SCr of 0.57 mg/dL (L)).    Allergies  Allergen Reactions   Azithromycin Hives    01/27/22 - pt states that he had a Zpak a couple years ago with no reaction   Clarithromycin Hives   Erythromycin Hives   Keflex [Cephalexin] Nausea Only   Metformin Nausea And Vomiting   Symbicort [Budesonide-Formoterol Fumarate] Other (See Comments)    States that 3 doses were used and breathing became worse    Antimicrobials this admission: Vancomycin 2/24 >> 2/28 Cefepime 2/24 >>  Daptomycin 2/28 >>  Dose adjustments this admission: N/a  Microbiology results: 2/24 BCx [id number] 2/25 R-knee tissue sample C >> rare GPC on GS >> pending 2/25 R-knee tissue sample A, B, D >> ngtd  Thank you for allowing pharmacy to be a part of this patients care.  Georgina Pillion, PharmD, BCPS Infectious Diseases Clinical Pharmacist 01/29/2022 11:11 AM   **Pharmacist phone directory can now be found on [email or web address] ([email or web address]).  Listed under [facility].

## 2022-01-29 NOTE — Discharge Instructions (Signed)
Diet: As you were doing prior to hospitalization   Shower:  May shower but keep the wounds dry, use an occlusive plastic wrap, NO SOAKING IN TUB.  If the bandage gets wet, change with a clean dry gauze.    Dressing:  You may change your dressing 3-5 days after surgery, unless you have a splint.  If the dressing remains clean and dry it can also be left on until follow up. If you change the dressing replace with clean gauze and tape or ace wrap.   If you had hand or foot surgery, we will plan to remove your stitches in about 2 weeks in the office.  For all other surgeries, there are sticky tapes (steri-strips) on your wounds and all the stitches are absorbable.  Leave the steri-strips in place when changing your dressings, they will peel off with time, usually 2-3 weeks.  Activity:  Increase activity slowly as tolerated, but follow the weight bearing instructions below.  The rules on driving is that you can not be taking narcotics while you drive, and you must feel in control of the vehicle.    Weight Bearing:   weight bearing as tolerated.    Blood clot prevention (DVT Prophylaxis): Continue xarelto 20mg  daily as instructed  To prevent constipation: you may use a stool softener such as -  Colace (over the counter) 100 mg by mouth twice a day  Drink plenty of fluids (prune juice may be helpful) and high fiber foods Miralax (over the counter) for constipation as needed.    Itching:  If you experience itching with your medications, try taking only a single pain pill, or even half a pain pill at a time.  You may take up to 10 pain pills per day, and you can also use benadryl over the counter for itching or also to help with sleep.   Precautions:  If you experience chest pain or shortness of breath - call 911 immediately for transfer to the hospital emergency department!!   Call office [phone]) for the following: Temperature greater than 101F Persistent nausea and vomiting Severe  uncontrolled pain Redness, tenderness, or signs of infection (pain, swelling, redness, odor or green/yellow discharge around the site) Difficulty breathing, headache or visual disturbances Hives Persistent dizziness or light-headedness Extreme fatigue Any other questions or concerns you may have after discharge  In an emergency, call 911 or go to an Emergency Department at a nearby hospital  Follow- Up Appointment:  Please call for an appointment to be seen approximately 1 week after surgery in [facility] with your surgeon [facility] or Dr. Weber Cooks- [phone] Address: [address], [place], [place]

## 2022-01-29 NOTE — Progress Notes (Signed)
PROGRESS NOTE  Joseph [name]  DOB: [id number]  PCP: Pleas Koch, NP [id number]  DOA: 01/25/2022  LOS: 4 days  Hospital Day: 5  Brief narrative: Joseph [name] is a 51 y.o. male with PMH significant for obesity, sleep apnea, DM2, diastolic CHF, COPD, DVT, anxiety, chronic back pain, chronic left knee pain on opioids, GERD. Patient presented to the ED on 01/25/2022 with a septic knee. 12/20/2021, he underwent R meniscectomy with Dr. Percell Miller, developed infection in the knee about a week later.    2/1, he had R knee I&D with Dr. Ephraim Hamburger.  Cultures grew Serratia Marcescens. Treated with oral doxycycline up until 2/14 then switched to ceftriaxone 2gm IV daily. 2/3, he developed acute DVT of the right gastrocnemius veins.  Started on Xarelto 2/24, seen at orthopedics office with concern of swelling and pain in the right knee and calf. US-guided aspiration of the calf was performed and 10cc of frank pus removed.  He was readmitted and underwent IND of right calf abscess and irrigation and debridement of right knee joint with partial synovectomy. Currently also being followed by ID.   Subjective: Patient was seen and examined this morning.   He was being seen by PT.  He had extreme pain at 'that one extremity painful spot' in his right knee. Later I saw him after therapy, lying on bed.  Not in distress. No other issues.  Principal Problem:   Septic arthritis of knee, right (HCC) Active Problems:   Cigarette smoker   Chronic pain syndrome   Type 2 diabetes mellitus with hyperglycemia (HCC)   OSA (obstructive sleep apnea)   Class 1 obesity due to excess calories with body mass index (BMI) of 33.0 to 33.9 in adult   Anxiety and depression   COPD without exacerbation (HCC)   Dyslipidemia   Opioid-induced constipation (OIC)   DVT (deep venous thrombosis) (HCC)    Assessment and Plan: Serratia Marcescens septic arthritis of knee, right   Right calf abscess -See above for surgical  and antibiotic history  -ID and orthopedics following.   -2/24, underwent IND of right calf abscess and irrigation and debridement of right knee joint with partial synovectomy. -ID suspects that patient still has serratia infection with probable resistant to ceftriaxone. -Currently on IV cefepime and daptomycin. -Follow-up on blood culture and wound cultures.  Blood culture sent on 2/24 has not shown any growth so far. -Currently has PICC line that was placed on 2/14.  Intractable acute pain Chronic pain syndrome  -Patient has chronic back pain is chronically on opioids.   -This morning patient is complaining of severe pain on the right lower extremity.  Orthopedics to decide if any further intervention is needed. -Currently is on Neurontin, Nucynta, oxy IR as well as IV Dilaudid.    Right leg DVT -Diagnosed on 2/3. -Currently on Xarelto  Type 2 diabetes mellitus -A1c 7.5 on 2/25 -Home meds include Tresiba 35 units, glipizide, metformin, Jardiance. -Currently on Semglee 30 units at bedtime, Jardiance, and glipizide.  Metformin on hold.  Continue sliding scale insulin.  Blood sugar level elevated to over 200 this morning.  Increase Semglee to 35 units for tonight. Recent Labs  Lab 01/28/22 1145 01/28/22 1609 01/28/22 2019 01/29/22 0853 01/29/22 1216  GLUCAP 111* 227* 147* 221* 173*   Opioid-induced constipation -Continue Movantik.  Patient states no bowel movement in last 4 days.  Dulcolax suppository as needed today  Dyslipidemia  -Continue Lipitor  COPD without exacerbation   -Smoking cessation  is essential! -Continue Breo, Combivent  Anxiety and depression  -Continue Wellbutrin, Celexa  Obesity -Body mass index is 33.56 kg/m. Patient has been advised to make an attempt to improve diet and exercise patterns to aid in weight loss.  OSA (obstructive sleep apnea)  -Continue CPAP  Cigarette smoker  -Encourage cessation.   -Patch ordered at patient request.   Goals  of care   Code Status: Prior    Mobility:  PT eval ordered.  Nutritional status:  Body mass index is 33.56 kg/m.          Diet:  Diet Order             Diet Carb Modified Fluid consistency: Thin; Room service appropriate? Yes  Diet effective now                   DVT prophylaxis:   rivaroxaban (XARELTO) tablet 20 mg   Antimicrobials: Cefepime vancomycin Fluid: None Family Communication: Family not at bedside today  Status is: Inpatient  Continue in-hospital care because: Continue IV antibiotics for septic arthritis Level of care: Med-Surg   Dispo: The patient is from: Home              Anticipated d/c is to: Home after orthopedic and ID clearance              Patient currently is not medically stable to d/c.   Difficult to place patient No     Infusions:   ceFEPime (MAXIPIME) IV 2 g (01/29/22 0804)   DAPTOmycin (CUBICIN)  IV      Scheduled Meds:  atorvastatin  40 mg Oral Daily   buPROPion  150 mg Oral BID   Chlorhexidine Gluconate Cloth  6 each Topical Daily   citalopram  40 mg Oral Daily   empagliflozin  10 mg Oral Daily   fluticasone furoate-vilanterol  1 puff Inhalation Daily   gabapentin  600 mg Oral BID   glipiZIDE  10 mg Oral BID AC   insulin aspart  0-20 Units Subcutaneous TID WC   [START ON 01/30/2022] insulin glargine-yfgn  35 Units Subcutaneous Daily   loratadine  10 mg Oral Daily   mupirocin ointment  1 application Nasal BID   naloxegol oxalate  25 mg Oral q morning   nicotine  21 mg Transdermal Daily   pantoprazole  40 mg Oral Daily   rivaroxaban  20 mg Oral Q supper   tamsulosin  0.4 mg Oral Daily    PRN meds: albuterol, bisacodyl, cyclobenzaprine, fluticasone, HYDROmorphone (DILAUDID) injection, ipratropium-albuterol, oxyCODONE, sodium chloride flush, tapentadol, zolpidem   Antimicrobials: Anti-infectives (From admission, onward)    Start     Dose/Rate Route Frequency Ordered Stop   01/29/22 1200  DAPTOmycin (CUBICIN) 700 mg  in sodium chloride 0.9 % IVPB        8 mg/kg  86.2 kg (Adjusted) 128 mL/hr over 30 Minutes Intravenous Daily 01/29/22 1111     01/26/22 0758  vancomycin (VANCOCIN) powder  Status:  Discontinued          As needed 01/26/22 0758 01/26/22 0912   01/26/22 0600  vancomycin (VANCOREADY) IVPB 1250 mg/250 mL  Status:  Discontinued        1,250 mg 166.7 mL/hr over 90 Minutes Intravenous Every 12 hours 01/25/22 1748 01/29/22 1101   01/25/22 1700  vancomycin (VANCOCIN) IVPB 1000 mg/200 mL premix        1,000 mg 200 mL/hr over 60 Minutes Intravenous  Once 01/25/22 1551 01/25/22 1906  01/25/22 1600  ceFEPIme (MAXIPIME) 2 g in sodium chloride 0.9 % 100 mL IVPB        2 g 200 mL/hr over 30 Minutes Intravenous Every 8 hours 01/25/22 1546     01/25/22 1600  vancomycin (VANCOCIN) IVPB 1000 mg/200 mL premix        1,000 mg 200 mL/hr over 60 Minutes Intravenous  Once 01/25/22 1551 01/25/22 1807       Objective: Vitals:   01/29/22 0408 01/29/22 0853  BP: (!) 141/87 127/89  Pulse: 94   Resp: 19 18  Temp: 98.1 F (36.7 C) 98 F (36.7 C)  SpO2: 94% 96%    Intake/Output Summary (Last 24 hours) at 01/29/2022 1410 Last data filed at 01/29/2022 1327 Gross per 24 hour  Intake 309.65 ml  Output 4800 ml  Net -4490.35 ml   Filed Weights   01/29/22 1100  Weight: 106.1 kg   Weight change:  Body mass index is 33.56 kg/m.   Physical Exam: General exam: Pleasant, middle-aged Caucasian male.  In mild to moderate distress because of pain Skin: No rashes, lesions or ulcers. HEENT: Atraumatic, normocephalic, no obvious bleeding Lungs: Clear to auscultation bilaterally CVS: Regular rate and rhythm, no murmur GI/Abd soft, nontender, nondistended, bowels are present CNS: Alert, awake, oriented x3 Psychiatry: Mood appropriate Extremities: No pedal edema, no calf tenderness.  Right knee site bandaged.  Has wound VAC in place.  Data Review: I have personally reviewed the laboratory data and studies  available.  F/u labs ordered Unresulted Labs (From admission, onward)     Start     Ordered   01/31/22 0600  CK  Every Mon,Thu,   R     Question:  Specimen collection method  Answer:  IV Team=IV Team collect   01/29/22 1117   01/29/22 1113  CK  Once,   R       Question:  Specimen collection method  Answer:  IV Team=IV Team collect   01/29/22 1112   01/29/22 0500  CBC with Differential/Platelet  Daily,   R     Question:  Specimen collection method  Answer:  IV Team=IV Team collect   01/28/22 1205   01/29/22 [id number]  Basic metabolic panel  Daily,   R     Question:  Specimen collection method  Answer:  IV Team=IV Team collect   01/28/22 1205            Signed, Terrilee Croak, MD [facility] 01/29/2022

## 2022-01-29 NOTE — Progress Notes (Signed)
Orthopaedic Progress Note  SUBJECTIVE: Doing well today. Eager to go home.  He denies distal numbness and tingling.  He denies pain in other joints or extremities.  Cultures with no growth to date at 3 days. Encouraged him to work on ROM, especially emphasized extension exercises and leg elevation to reduce swelling in the foot.  OBJECTIVE:  Vitals:   01/29/22 0408 01/29/22 0853  BP: (!) 141/87 127/89  Pulse: 94   Resp: 19 18  Temp: 98.1 F (36.7 C) 98 F (36.7 C)  SpO2: 94% 96%    General: Sitting up in bed, NAD.  Pleasant and cooperative Respiratory: No increased work of breathing.  Right lower extremity: Dressing c/d/I, minimal swelling about the knee and calf, moderate foot swelling 2 Hemovac drains with minimal serosanguineous drainage. Tolerates ROM 15-90.   Ankle DF/PF is intact.  Endorses sensation to light touch distally over all aspects of the foot.  Able to wiggle toes.  Neurovascularly intact.    LABS:  Results for orders placed or performed during the hospital encounter of 01/25/22 (from the past 24 hour(s))  Glucose, capillary     Status: Abnormal   Collection Time: 01/28/22  4:09 PM  Result Value Ref Range   Glucose-Capillary 227 (H) 70 - 99 mg/dL  Glucose, capillary     Status: Abnormal   Collection Time: 01/28/22  8:19 PM  Result Value Ref Range   Glucose-Capillary 147 (H) 70 - 99 mg/dL  CBC with Differential/Platelet     Status: Abnormal   Collection Time: 01/29/22  3:16 AM  Result Value Ref Range   WBC 8.9 4.0 - 10.5 K/uL   RBC 3.85 (L) 4.22 - 5.81 MIL/uL   Hemoglobin 11.2 (L) 13.0 - 17.0 g/dL   HCT 93.2 (L) 35.5 - 73.2 %   MCV 87.8 80.0 - 100.0 fL   MCH 29.1 26.0 - 34.0 pg   MCHC 33.1 30.0 - 36.0 g/dL   RDW 20.2 54.2 - 70.6 %   Platelets 274 150 - 400 K/uL   nRBC 0.0 0.0 - 0.2 %   Neutrophils Relative % 59 %   Neutro Abs 5.3 1.7 - 7.7 K/uL   Lymphocytes Relative 27 %   Lymphs Abs 2.4 0.7 - 4.0 K/uL   Monocytes Relative 8 %   Monocytes Absolute  0.7 0.1 - 1.0 K/uL   Eosinophils Relative 4 %   Eosinophils Absolute 0.4 0.0 - 0.5 K/uL   Basophils Relative 1 %   Basophils Absolute 0.1 0.0 - 0.1 K/uL   Immature Granulocytes 1 %   Abs Immature Granulocytes 0.04 0.00 - 0.07 K/uL  Basic metabolic panel     Status: Abnormal   Collection Time: 01/29/22  3:16 AM  Result Value Ref Range   Sodium 137 135 - 145 mmol/L   Potassium 3.7 3.5 - 5.1 mmol/L   Chloride 101 98 - 111 mmol/L   CO2 27 22 - 32 mmol/L   Glucose, Bld 121 (H) 70 - 99 mg/dL   BUN <5 (L) 6 - 20 mg/dL   Creatinine, Ser 2.37 (L) 0.61 - 1.24 mg/dL   Calcium 8.5 (L) 8.9 - 10.3 mg/dL   GFR, Estimated >62 >83 mL/min   Anion gap 9 5 - 15  Glucose, capillary     Status: Abnormal   Collection Time: 01/29/22  8:53 AM  Result Value Ref Range   Glucose-Capillary 221 (H) 70 - 99 mg/dL  Glucose, capillary     Status: Abnormal   Collection  Time: 01/29/22 12:16 PM  Result Value Ref Range   Glucose-Capillary 173 (H) 70 - 99 mg/dL    ASSESSMENT: Joseph [name] is a 51 y.o. male, 3 Days Post-Op s/p INCISION AND DRAINAGE ABSCESS POST OP RIGHT KNEE ARTHROSCOPY  PLAN: Weightbearing: WBAT RLE ROM: Unrestricted knee motion as tolerated Incisional and dressing care: Dressing changed 2/27, drains removed. Showering: Hold off for now Orthopedic device(s): None  Pain management:  1. Gabapentin 600 mg BID 2. Flexeril 10 mg BID PRN 3. Oxycodone 5-10 mg q 4 hours PRN 4. Dilaudid 1 mg q 3 hours PRN 5.  Nucynta 75 mg TID PRN VTE prophylaxis: Resumed xarelto 2/28 SCDs ID: abx per ID, new intraop cxs NGTD Foley/Lines:  No foley, KVO IVFs Dispo: Therapies as tolerated.  Continue to monitor intra-op cultures, appreciate ID assistance with antibiotic regimen.    Follow - up plan: Follow up 1 week post discharge    Amante Fomby A Talayia Hjort 01/28/2022, 3:18 PM   Weber Cooks, MD  Contact information:   [phone] 7am-5pm epic message Dr. Blanchie Dessert, or call office for patient follow up:  [phone] After hours and holidays please check [email or web address] for group call information for [facility]

## 2022-01-30 DIAGNOSIS — M009 Pyogenic arthritis, unspecified: Secondary | ICD-10-CM | POA: Diagnosis not present

## 2022-01-30 LAB — CBC WITH DIFFERENTIAL/PLATELET
Abs Immature Granulocytes: 0.07 10*3/uL (ref 0.00–0.07)
Basophils Absolute: 0.1 10*3/uL (ref 0.0–0.1)
Basophils Relative: 1 %
Eosinophils Absolute: 0.4 10*3/uL (ref 0.0–0.5)
Eosinophils Relative: 4 %
HCT: 33.8 % — ABNORMAL LOW (ref 39.0–52.0)
Hemoglobin: 11.4 g/dL — ABNORMAL LOW (ref 13.0–17.0)
Immature Granulocytes: 1 %
Lymphocytes Relative: 24 %
Lymphs Abs: 2.5 10*3/uL (ref 0.7–4.0)
MCH: 29.1 pg (ref 26.0–34.0)
MCHC: 33.7 g/dL (ref 30.0–36.0)
MCV: 86.2 fL (ref 80.0–100.0)
Monocytes Absolute: 0.9 10*3/uL (ref 0.1–1.0)
Monocytes Relative: 8 %
Neutro Abs: 6.6 10*3/uL (ref 1.7–7.7)
Neutrophils Relative %: 62 %
Platelets: 291 10*3/uL (ref 150–400)
RBC: 3.92 MIL/uL — ABNORMAL LOW (ref 4.22–5.81)
RDW: 14.1 % (ref 11.5–15.5)
WBC: 10.5 10*3/uL (ref 4.0–10.5)
nRBC: 0 % (ref 0.0–0.2)

## 2022-01-30 LAB — CULTURE, BLOOD (ROUTINE X 2)
Culture: NO GROWTH
Culture: NO GROWTH
Special Requests: ADEQUATE
Special Requests: ADEQUATE

## 2022-01-30 LAB — BASIC METABOLIC PANEL
Anion gap: 8 (ref 5–15)
BUN: 5 mg/dL — ABNORMAL LOW (ref 6–20)
CO2: 28 mmol/L (ref 22–32)
Calcium: 8.6 mg/dL — ABNORMAL LOW (ref 8.9–10.3)
Chloride: 100 mmol/L (ref 98–111)
Creatinine, Ser: 0.64 mg/dL (ref 0.61–1.24)
GFR, Estimated: >60 mL/min (ref >60)
Glucose, Bld: 194 mg/dL — ABNORMAL HIGH (ref 70–99)
Potassium: 3.8 mmol/L (ref 3.5–5.1)
Sodium: 136 mmol/L (ref 135–145)

## 2022-01-30 LAB — GLUCOSE, CAPILLARY
Glucose-Capillary: 228 mg/dL — ABNORMAL HIGH (ref 70–99)
Glucose-Capillary: 189 mg/dL — ABNORMAL HIGH (ref 70–99)

## 2022-01-30 MED ORDER — TAPENTADOL HCL 50 MG PO TABS
75.0000 mg | ORAL_TABLET | Freq: Four times a day (QID) | ORAL | Status: DC
  Administered 2022-01-30: 75 mg via ORAL
  Filled 2022-01-30: qty 2

## 2022-01-30 MED ORDER — CEFEPIME IV (FOR PTA / DISCHARGE USE ONLY): 2.0000 g | Freq: Three times a day (TID) | INTRAVENOUS | 0 refills | Status: AC

## 2022-01-30 MED ORDER — DAPTOMYCIN IV (FOR PTA / DISCHARGE USE ONLY): 700.0000 mg | INTRAVENOUS | 0 refills | Status: DC

## 2022-01-30 NOTE — Progress Notes (Signed)
Orthopaedic Progress Note ? ?SUBJECTIVE: Doing well today. Eager to go home.  He denies distal numbness and tingling.  He denies pain in other joints or extremities.  Cultures with no growth to date at 4 days. Encouraged him to work on ROM, especially emphasized extension exercises and leg elevation to reduce swelling in the foot. ? ?OBJECTIVE:  ?Vitals:  ? 01/29/22 2032 01/30/22 0721  ?BP:  135/84  ?Pulse: 95 88  ?Resp: 18 17  ?Temp:  97.9 ?F (36.6 ?C)  ?SpO2: 98% 98%  ? ? ?General: Sitting up in bed, NAD.  Pleasant and cooperative ?Respiratory: No increased work of breathing.  ?Right lower extremity: Dressing changed, incicsion mild erythema, sutures intact, no drainage, minimal swelling about the knee and calf, moderate foot swelling ? Tolerates ROM 15-90. ? ? Ankle DF/PF is intact.  Endorses sensation to light touch distally over all aspects of the foot.  Able to wiggle toes.  Neurovascularly intact.   ? ?LABS:  ?Results for orders placed or performed during the hospital encounter of 01/25/22 (from the past 24 hour(s))  ?Glucose, capillary     Status: Abnormal  ? Collection Time: 01/29/22 12:16 PM  ?Result Value Ref Range  ? Glucose-Capillary 173 (H) 70 - 99 mg/dL  ?CK     Status: Abnormal  ? Collection Time: 01/29/22  3:38 PM  ?Result Value Ref Range  ? Total CK 43 (L) 49 - 397 U/L  ?Glucose, capillary     Status: Abnormal  ? Collection Time: 01/29/22  3:55 PM  ?Result Value Ref Range  ? Glucose-Capillary 163 (H) 70 - 99 mg/dL  ?Glucose, capillary     Status: Abnormal  ? Collection Time: 01/29/22  8:05 PM  ?Result Value Ref Range  ? Glucose-Capillary 237 (H) 70 - 99 mg/dL  ?CBC with Differential/Platelet     Status: Abnormal  ? Collection Time: 01/30/22  4:03 AM  ?Result Value Ref Range  ? WBC 10.5 4.0 - 10.5 K/uL  ? RBC 3.92 (L) 4.22 - 5.81 MIL/uL  ? Hemoglobin 11.4 (L) 13.0 - 17.0 g/dL  ? HCT 33.8 (L) 39.0 - 52.0 %  ? MCV 86.2 80.0 - 100.0 fL  ? MCH 29.1 26.0 - 34.0 pg  ? MCHC 33.7 30.0 - 36.0 g/dL  ? RDW  14.1 11.5 - 15.5 %  ? Platelets 291 150 - 400 K/uL  ? nRBC 0.0 0.0 - 0.2 %  ? Neutrophils Relative % 62 %  ? Neutro Abs 6.6 1.7 - 7.7 K/uL  ? Lymphocytes Relative 24 %  ? Lymphs Abs 2.5 0.7 - 4.0 K/uL  ? Monocytes Relative 8 %  ? Monocytes Absolute 0.9 0.1 - 1.0 K/uL  ? Eosinophils Relative 4 %  ? Eosinophils Absolute 0.4 0.0 - 0.5 K/uL  ? Basophils Relative 1 %  ? Basophils Absolute 0.1 0.0 - 0.1 K/uL  ? Immature Granulocytes 1 %  ? Abs Immature Granulocytes 0.07 0.00 - 0.07 K/uL  ?Basic metabolic panel     Status: Abnormal  ? Collection Time: 01/30/22  4:03 AM  ?Result Value Ref Range  ? Sodium 136 135 - 145 mmol/L  ? Potassium 3.8 3.5 - 5.1 mmol/L  ? Chloride 100 98 - 111 mmol/L  ? CO2 28 22 - 32 mmol/L  ? Glucose, Bld 194 (H) 70 - 99 mg/dL  ? BUN 5 (L) 6 - 20 mg/dL  ? Creatinine, Ser 0.64 0.61 - 1.24 mg/dL  ? Calcium 8.6 (L) 8.9 - 10.3 mg/dL  ?  GFR, Estimated >60 >60 mL/min  ? Anion gap 8 5 - 15  ?Glucose, capillary     Status: Abnormal  ? Collection Time: 01/30/22  7:59 AM  ?Result Value Ref Range  ? Glucose-Capillary 228 (H) 70 - 99 mg/dL  ? ? ?ASSESSMENT: Joseph [name] is a 51 y.o. male, 4 Days Post-Op s/p INCISION AND DRAINAGE ABSCESS POST OP RIGHT KNEE ARTHROSCOPY ? ?PLAN: ?Weightbearing: WBAT RLE ?ROM: Unrestricted knee motion as tolerated ?Incisional and dressing care: Dressing changed 2/27, drains removed. Showering: Hold off for now ?Orthopedic device(s): None  ?Pain management:  ?1. Gabapentin 600 mg BID ?2. Flexeril 10 mg BID PRN ?3. Oxycodone 5-10 mg q 4 hours PRN ?4. Dilaudid 1 mg q 3 hours PRN ?5.  Nucynta 75 mg TID PRN ?VTE prophylaxis: Resumed xarelto 2/28 SCDs ?ID: abx per ID, new intraop cxs NGTD ?Foley/Lines:  No foley, KVO IVFs ?Dispo: Therapies as tolerated.  Continue to monitor intra-op cultures, appreciate ID assistance with antibiotic regimen.   ? ?Follow - up plan: Follow up Monday in clinic ? ? ?Mardee Clune A Lavora Brisbon ?01/28/2022, 3:18 PM ? ? ?Weber Cooks, MD ? ?Contact information:    ?Weekdays 7am-5pm epic message Dr. Blanchie Dessert, or call office for patient follow up: [phone] ?After hours and holidays please check [email or web address] for group call information for [facility] ? ?

## 2022-01-30 NOTE — Progress Notes (Signed)
Pt given AVS.   Educated with spouse at [facility].  Right upper single lumen picc in tact.  DDI.  Ready for IV home antibiotics. Dose of Daptomycin 700 mg IV administered right before discharge.   ?

## 2022-01-30 NOTE — Discharge Summary (Signed)
Physician Discharge Summary  XXAVIER NOON GEX:[id number] DOB: May 31, 1971 DOA: 01/25/2022  PCP: Pleas Koch, NP  Admit date: 01/25/2022 Discharge date: 01/30/2022  Admitted From: Home Discharge disposition: Home with outpatient antibiotics  Recommendations at discharge:  Continue IV antibiotics for 6 weeks Judicious use of pain medicines Stop Lipitor/atorvastatin (cholesterol medicine) while you are on IV daptomycin for 6 weeks  Brief narrative: Joseph [name] is a 51 y.o. male with PMH significant for obesity, sleep apnea, DM2, diastolic CHF, COPD, DVT, anxiety, chronic back pain, chronic left knee pain on opioids, GERD. Patient presented to the ED on 01/25/2022 with a septic knee. 12/20/2021, he underwent R meniscectomy with Dr. Percell Miller, developed infection in the knee about a week later.    2/1, he had R knee I&D with Dr. Ephraim Hamburger.  Cultures grew Serratia Marcescens. Treated with oral doxycycline up until 2/14 then switched to ceftriaxone 2gm IV daily. 2/3, he developed acute DVT of the right gastrocnemius veins.  Started on Xarelto 2/24, seen at orthopedics office with concern of swelling and pain in the right knee and calf. US-guided aspiration of the calf was performed and 10cc of frank pus removed.  He was readmitted and underwent IND of right calf abscess and irrigation and debridement of right knee joint with partial synovectomy. Also followed by ID inpatient and outpatient.   Subjective: Patient was seen and examined this morning.   Sitting up in bed.  Not in distress.  He states that his pain is better controlled at home than in the hospital because he is having to wait several hours after he asks for pain meds.  Principal Problem:   Septic arthritis of knee, right (HCC) Active Problems:   Cigarette smoker   Chronic pain syndrome   Type 2 diabetes mellitus with hyperglycemia (HCC)   OSA (obstructive sleep apnea)   Class 1 obesity due to excess calories with body mass  index (BMI) of 33.0 to 33.9 in adult   Anxiety and depression   COPD without exacerbation (HCC)   Dyslipidemia   Opioid-induced constipation ([facility])   DVT (deep venous thrombosis) (HCC)   Calf abscess    Assessment and Plan: Serratia Marcescens septic arthritis of knee, right   Right calf abscess -See above for surgical and antibiotic history  -ID and orthopedics following.   -2/24, underwent IND of right calf abscess and irrigation and debridement of right knee joint with partial synovectomy. -ID suspects that patient still has serratia infection with probable resistant to ceftriaxone. -Currently on IV cefepime and daptomycin. -No growth in blood culture and wound culture sent on this admission. -Currently has PICC line that was placed on 2/14. -Per ID recommendation, patient will be discharged on daptomycin and cefepime for 6 weeks from or date. -To follow-up with ID and orthopedics as an outpatient.  Intractable acute pain Chronic pain syndrome  -Patient has chronic back pain and is chronically on opioids.   -Continue previous regimen with Neurontin, Nucynta, oxy IR.  Right leg DVT -Diagnosed on 2/3. -Currently on Xarelto  Type 2 diabetes mellitus -A1c 7.5 on 2/25 -Home meds include Tresiba 35 units, glipizide, metformin, Jardiance. -He states his blood sugar level is better controlled at home. -Continue the same post discharge  Opioid-induced constipation -Continue Movantik. Dulcolax suppository as needed.  Dyslipidemia  -Patient was previously on Lipitor.  We will keep it on hold for 6 weeks while he is on daptomycin.  COPD without exacerbation   -Smoking cessation is essential! -Continue Breo,  Combivent  Anxiety and depression  -Continue Wellbutrin, Celexa  Obesity -Body mass index is 33.56 kg/m. Patient has been advised to make an attempt to improve diet and exercise patterns to aid in weight loss.  OSA (obstructive sleep apnea)  -Continue CPAP  Cigarette  smoker  -Encourage cessation.   -Patch ordered at patient request.   Goals of care   Code Status: Prior    Wounds: Incision (Closed) 01/02/22 Knee (Active)  Date First Assessed/Time First Assessed: 01/02/22 1416   Location: Knee    Assessments 01/02/2022  2:35 PM 01/02/2022  4:30 PM  Dressing Type Compression wrap;Gauze (Comment) Compression wrap;Gauze (Comment)  Dressing Clean;Dry;Intact Clean;Dry;Intact     No Linked orders to display     Incision (Closed) 01/26/22 Leg Right (Active)  Date First Assessed/Time First Assessed: 01/26/22 0818   Location: Leg  Location Orientation: Right    Assessments 01/26/2022  9:15 AM 01/29/2022  7:59 PM  Dressing Type Non adherent;Compression wrap;Gauze (Comment);Other (Comment);Transparent dressing Compression wrap  Dressing Clean, Dry, Intact Clean, Dry, Intact  Site / Wound Assessment -- Dressing in place / Unable to assess  Drainage Amount Minimal None  Drainage Description Sanguineous --     No Linked orders to display    Discharge Exam:   Vitals:   01/29/22 1959 01/29/22 2031 01/29/22 2032 01/30/22 0721  BP: 139/84   135/84  Pulse: 95  95 88  Resp:   18 17  Temp: 99 F (37.2 C)   97.9 F (36.6 C)  TempSrc: Oral   Oral  SpO2: 96% 98% 98% 98%  Weight:      Height:        Body mass index is 33.56 kg/m.  General exam: Pleasant, middle-aged Caucasian male.   Skin: No rashes, lesions or ulcers. HEENT: Atraumatic, normocephalic, no obvious bleeding Lungs: Clear to auscultation bilaterally CVS: Regular rate and rhythm, no murmur GI/Abd soft, nontender, nondistended, bowels are present CNS: Alert, awake, oriented x3 Psychiatry: Mood appropriate Extremities: No pedal edema, no calf tenderness.  Right knee site bandaged.    Follow ups:    Follow-up Information     Willaim Sheng, MD Follow up in 1 week(s).   Specialty: Orthopedic Surgery Contact information: [address] [place]  [address] [phone]         Pleas Koch, NP Follow up.   Specialty: Internal Medicine Contact information: [place] [address] [phone]                 Discharge Instructions:   Discharge Instructions     [facility] pharmacist to adjust dose for Vancomycin, Aminoglycosides and other anti-infective therapies as requested by physician.   Complete by: As directed    [facility] to provide Cath Flo 26m   Complete by: As directed    Administer for PICC line occlusion and as ordered by physician for other access device issues.   Anaphylaxis Kit: Provided to treat any anaphylactic reaction to the medication being provided to the patient if First Dose or when requested by physician   Complete by: As directed    Epinephrine 17mml vial / amp: Administer 0.34m30m0.34ml19mubcutaneously once for moderate to severe anaphylaxis, nurse to call physician and pharmacy when reaction occurs and call 911 if needed for immediate care   Diphenhydramine 50mg59mIV vial: Administer 25-50mg 86mM PRN for first dose reaction, rash, itching, mild reaction, nurse to call physician and pharmacy  when reaction occurs   Sodium Chloride 0.9% NS 540m IV: Administer if needed for hypovolemic blood pressure drop or as ordered by physician after call to physician with anaphylactic reaction   Call MD for:  difficulty breathing, headache or visual disturbances   Complete by: As directed    Call MD for:  extreme fatigue   Complete by: As directed    Call MD for:  hives   Complete by: As directed    Call MD for:  persistant dizziness or light-headedness   Complete by: As directed    Call MD for:  persistant nausea and vomiting   Complete by: As directed    Call MD for:  severe uncontrolled pain   Complete by: As directed    Call MD for:  temperature >100.4   Complete by: As directed    Change dressing on IV access line weekly and PRN   Complete by: As  directed    Diet general   Complete by: As directed    Discharge instructions   Complete by: As directed    Recommendations at discharge:   Continue IV antibiotics for 6 weeks  Judicious use of pain medicines  Discharge instructions for diabetes mellitus: Check blood sugar 3 times a day and bedtime at home. If blood sugar running above 200 or less than 70 please call your MD to adjust insulin. If you notice signs and symptoms of hypoglycemia (low blood sugar) like jitteriness, confusion, thirst, tremor and sweating, please check blood sugar, drink sugary drink/biscuits/sweets to increase sugar level and call MD or return to ER.    General discharge instructions: Follow with Primary MD CPleas Koch NP in 7 days  Please request your PCP  to go over your hospital tests, procedures, radiology results at the follow up. Please get your medicines reviewed and adjusted.  Your PCP may decide to repeat certain labs or tests as needed. Do not drive, operate heavy machinery, perform activities at heights, swimming or participation in water activities or provide baby sitting services if your were admitted for syncope or siezures until you have seen by Primary MD or a Neurologist and advised to do so again. [place]. Do not drive, operate heavy machinery, perform activities at heights, swim, participate in water activities or provide baby-sitting services while on medications for pain, sleep and mood until your outpatient physician has reevaluated you and advised to do so again.  You are strongly recommended to comply with the dose, frequency and duration of prescribed medications. Activity: As tolerated with Full fall precautions use walker/cane & assistance as needed Avoid using any recreational substances like cigarette, tobacco, alcohol, or non-prescribed drug. If you experience worsening of your admission symptoms, develop  shortness of breath, life threatening emergency, suicidal or homicidal thoughts you must seek medical attention immediately by calling 911 or calling your MD immediately  if symptoms less severe. You must read complete instructions/literature along with all the possible adverse reactions/side effects for all the medicines you take and that have been prescribed to you. Take any new medicine only after you have completely understood and accepted all the possible adverse reactions/side effects.  Wear Seat belts while driving. You were cared for by a hospitalist during your hospital stay. If you have any questions about your discharge medications or the care you received while you were in the hospital after you are discharged, you can call the unit and ask to speak with the  hospitalist or the covering physician. Once you are discharged, your primary care physician will handle any further medical issues. Please note that NO REFILLS for any discharge medications will be authorized once you are discharged, as it is imperative that you return to your primary care physician (or establish a relationship with a primary care physician if you do not have one).   Discharge wound care:   Complete by: As directed    Flush IV access with Sodium Chloride 0.9% and Heparin 10 units/ml or 100 units/ml   Complete by: As directed    Home infusion instructions - [facility]   Complete by: As directed    Instructions: Flush IV access with Sodium Chloride 0.9% and Heparin 10units/ml or 100units/ml   Change dressing on IV access line: Weekly and PRN   Instructions Cath Flo 74m: Administer for PICC Line occlusion and as ordered by physician for other access device   [facility] pharmacist to adjust dose for: Vancomycin, Aminoglycosides and other anti-infective therapies as requested by physician   Increase activity slowly   Complete by: As directed    Method of administration may be changed at the  discretion of home infusion pharmacist based upon assessment of the patient and/or caregivers ability to self-administer the medication ordered   Complete by: As directed        Discharge Medications:   Allergies as of 01/30/2022       Reactions   Azithromycin Hives   01/27/22 - pt states that he had a Zpak a couple years ago with no reaction   Clarithromycin Hives   Erythromycin Hives   Keflex [cephalexin] Nausea Only   Metformin Nausea And Vomiting   Symbicort [budesonide-formoterol Fumarate] Other (See Comments)   States that 3 doses were used and breathing became worse        Medication List     STOP taking these medications    atorvastatin 40 MG tablet Commonly known as: LIPITOR   cefTRIAXone 2 g injection Commonly known as: ROCEPHIN       TAKE these medications    albuterol (2.5 MG/3ML) 0.083% nebulizer solution Commonly known as: PROVENTIL USE 3 ML(1 VIAL) VIA NEBULIZER FOUR TIMES DAILY AS NEEDED FOR SHORTNESS OF BREATH What changed:  how much to take how to take this when to take this additional instructions   blood glucose meter kit and supplies Kit Dispense based on patient and insurance preference. Use up to four times daily as directed. (FOR ICD-9 250.00, 250.01).   buPROPion 150 MG 12 hr tablet Commonly known as: WELLBUTRIN SR Take 1 tablet (150 mg total) by mouth 2 (two) times daily. For anxiety and depression.   ceFEPime  IVPB Commonly known as: MAXIPIME Inject 2 g into the vein every 8 (eight) hours. Indication:  R-knee PJI First Dose: Yes Last Day of Therapy:  03/09/22 Labs - Once weekly:  CBC/D and BMP, Labs - Every other week:  ESR and CRP Method of administration: IV Push Method of administration may be changed at the discretion of home infusion pharmacist based upon assessment of the patient and/or caregiver's ability to self-administer the medication ordered.   cetirizine 10 MG tablet Commonly known as: ZYRTEC Take 1 tablet by mouth  at bedtime for allergies. What changed:  how much to take how to take this when to take this additional instructions   citalopram 40 MG tablet Commonly known as: CELEXA Take 1 tablet (40 mg total) by mouth daily. For anxiety and  depression. What changed: when to take this   Combivent Respimat 20-100 MCG/ACT Aers respimat Generic drug: Ipratropium-Albuterol INHALE ONE PUFF INTO THE LUNGS EVERY FOUR HOURS AS NEEDED FOR WHEEZING What changed: See the new instructions.   cyclobenzaprine 10 MG tablet Commonly known as: FLEXERIL Take 10 mg by mouth 2 (two) times daily as needed.   daptomycin  IVPB Commonly known as: CUBICIN Inject 700 mg into the vein daily. Indication:  R-knee PJI First Dose: Yes Last Day of Therapy:  03/09/22 Labs - Once weekly:  CBC/D, BMP Labs - Every other week:  ESR, CRP, and CPK Method of administration: IV Push Method of administration may be changed at the discretion of home infusion pharmacist based upon assessment of the patient and/or caregiver's ability to self-administer the medication ordered.   empagliflozin 10 MG Tabs tablet Commonly known as: Jardiance Take 1 tablet (10 mg total) by mouth daily. For diabetes. What changed: when to take this   fluticasone 50 MCG/ACT nasal spray Commonly known as: FLONASE Place 2 sprays into both nostrils daily as needed for allergies or rhinitis.   fluticasone furoate-vilanterol 200-25 MCG/INH Aepb Commonly known as: Breo Ellipta Inhale 1 puff into the lungs daily. What changed: when to take this   FreeStyle Libre 3 Sensor Misc 1 Units by Does not apply route 4 (four) times daily. Place 1 sensor on the skin every 14 days. Use to check glucose continuously   gabapentin 300 MG capsule Commonly known as: NEURONTIN Take 2 capsules (600 mg total) by mouth 2 (two) times daily. For pain.   glipiZIDE 10 MG tablet Commonly known as: GLUCOTROL Take 1 tablet (10 mg total) by mouth 2 (two) times daily before a meal.  For diabetes.   insulin degludec 100 UNIT/ML FlexTouch Pen Commonly known as: TRESIBA Inject 35 Units into the skin daily. For diabetes. What changed: when to take this   lansoprazole 30 MG capsule Commonly known as: PREVACID Take 1 capsule (30 mg total) by mouth 2 (two) times daily before a meal. For heartburn.   metFORMIN 500 MG 24 hr tablet Commonly known as: GLUCOPHAGE-XR Take 2 tablets (1,000 mg total) by mouth 2 (two) times daily with a meal. For diabetes.   methocarbamol 500 MG tablet Commonly known as: ROBAXIN Take 500 mg by mouth at bedtime.   Movantik 25 MG Tabs tablet Generic drug: naloxegol oxalate Take 25 mg by mouth every morning.   Nucynta 75 MG tablet Generic drug: tapentadol HCl Take 75 mg by mouth 4 (four) times daily as needed (pain).   OneTouch Delica Plus [id number] Misc USE TO CHECK BLOOD SUGAR UP TO FOUR TIMES DAILY   OneTouch Verio test strip Generic drug: glucose blood USE TO CHECK BLOOD SUGAR UP TO FOUR TIMES DAILY   OVER THE COUNTER MEDICATION Take 2 tablets by mouth every morning. Osteo Biflex triple strength with turmeric   OVER THE COUNTER MEDICATION Take 1 tablet by mouth every morning. Neuriva   Oxycodone HCl 10 MG Tabs Take 1 tablet (10 mg total) by mouth every 4 (four) hours as needed for up to 7 days. What changed:  medication strength how much to take when to take this reasons to take this   rivaroxaban 20 MG Tabs tablet Commonly known as: XARELTO Take 1 tablet (20 mg total) by mouth daily with supper. What changed: Another medication with the same name was removed. Continue taking this medication, and follow the directions you see here.   tadalafil 20 MG tablet Commonly  known as: CIALIS Take 20 mg by mouth daily as needed for erectile dysfunction.   tamsulosin 0.4 MG Caps capsule Commonly known as: FLOMAX TAKE ONE CAPSULE BY MOUTH DAILY What changed:  how to take this when to take this   Testosterone 40.5 MG/2.5GM  (1.62%) Gel Place 2.5 g onto the skin every morning. 2 pumps - 2.5 g   Unifine Pentips 31G X 6 MM Misc Generic drug: Insulin Pen Needle USE NIGHTLY WITH INSULIN   zolpidem 10 MG tablet Commonly known as: AMBIEN TAKE ONE TABLET (10MG TOTAL) BY MOUTH ATBEDTIME AS NEEDED FOR SLEEP What changed: See the new instructions.               Discharge Care Instructions  (From admission, onward)           Start     Ordered   01/30/22 0000  Change dressing on IV access line weekly and PRN  (Home infusion instructions - [facility] )        01/30/22 1008   01/30/22 0000  Discharge wound care:        01/30/22 1008             The results of significant diagnostics from this hospitalization (including imaging, microbiology, ancillary and laboratory) are listed below for reference.    Procedures and Diagnostic Studies:   MR TIBIA FIBULA RIGHT WO CONTRAST  Result Date: 01/26/2022 CLINICAL DATA:  Soft tissue infection. Patient has been on IV antibiotics. Evaluate for progression. EXAM: MRI OF THE RIGHT KNEE WITHOUT CONTRAST; MRI OF LOWER RIGHT EXTREMITY WITHOUT CONTRAST TECHNIQUE: Multiplanar, multisequence MR imaging of the knee was performed. No intravenous contrast was administered. Multiplanar, multisequence MR imaging of the right lower leg was performed. No intravenous contrast was administered. COMPARISON:  10/26/2021. FINDINGS: MENISCI Medial: Severe attenuation of the posterior horn and body of the medial meniscus likely reflecting prior meniscectomy. Lateral: Intact. LIGAMENTS Cruciates: Complete ACL tear.  Intact PCL. Collaterals: Medial collateral ligament is intact. Lateral collateral ligament complex is intact. CARTILAGE Patellofemoral: Full-thickness cartilage loss of the patellar apex and medial patellar facet with subchondral reactive marrow edema. Partial-thickness cartilage loss of the lateral trochlea. Medial: Extensive full-thickness cartilage loss of the  medial femorotibial compartment with severe subchondral marrow edema. Lateral: Partial-thickness cartilage loss of the lateral femorotibial compartment with mild subchondral marrow edema. JOINT: Large joint effusion with extensive synovitis. 8.8 x 3.6 x 12.8 cm complex fluid collection between the soleus muscle and gastrocnemius muscles which appears contiguous with the posterolateral joint space at the level of the musculotendinous junction of the popliteus with numerous low signal foci within the collection consistent with air. Normal Hoffa's fat-pad. No plical thickening. POPLITEAL FOSSA: Popliteus tendon is intact. No Baker's cyst. EXTENSOR MECHANISM: Intact quadriceps tendon. Intact patellar tendon. Intact lateral patellar retinaculum. Intact medial patellar retinaculum. Intact MPFL. BONES: No aggressive osseous lesion. No fracture or dislocation. Other: Soft tissue edema circumferentially around the knee and right lower leg concerning for cellulitis. Muscle edema in the popliteus muscle, inferior aspect of the medial gastrocnemius muscle, inferior aspect of the lateral gastrocnemius muscle and the anterior compartment musculature concerning for myositis. Small prepatellar fluid collection measuring 3.4 x 2 x 0.3 cm. IMPRESSION: 1. Large joint effusion with extensive synovitis concerning for septic arthritis. 8.8 x 3.6 x 12.8 cm complex fluid collection between the soleus muscle and gastrocnemius muscles which appears contiguous with the posterolateral joint space at the level of the musculotendinous junction of the popliteus with  numerous low signal foci within the collection consistent with air concerning for an abscess. Subchondral bone marrow edema centered at the medial femorotibial compartment and to lesser extent lateral femorotibial compartment concerning for developing osteomyelitis versus reactive marrow edema from surgery and chondral loss. 2. Muscle edema in the popliteus muscle, inferior aspect of  the medial gastrocnemius muscle, inferior aspect of the lateral gastrocnemius muscle and the anterior compartment musculature concerning for myositis. 3. Generalized soft tissue edema throughout the right lower leg and around the knee concerning for cellulitis. 4. Small prepatellar fluid collection measuring 3.4 x 2 x 0.3 cm likely reflecting a postoperative seroma or bursitis. 5. Tricompartmental cartilage abnormalities as described above most severe in the medial femorotibial compartment. 6. Severe attenuation of the posterior horn and body of the medial meniscus likely reflecting prior meniscectomy. 7. Complete tear of the ACL. Electronically Signed   By: Kathreen Devoid M.D.   On: 01/26/2022 09:27   MR KNEE RIGHT WO CONTRAST  Result Date: 01/26/2022 CLINICAL DATA:  Soft tissue infection. Patient has been on IV antibiotics. Evaluate for progression. EXAM: MRI OF THE RIGHT KNEE WITHOUT CONTRAST; MRI OF LOWER RIGHT EXTREMITY WITHOUT CONTRAST TECHNIQUE: Multiplanar, multisequence MR imaging of the knee was performed. No intravenous contrast was administered. Multiplanar, multisequence MR imaging of the right lower leg was performed. No intravenous contrast was administered. COMPARISON:  10/26/2021. FINDINGS: MENISCI Medial: Severe attenuation of the posterior horn and body of the medial meniscus likely reflecting prior meniscectomy. Lateral: Intact. LIGAMENTS Cruciates: Complete ACL tear.  Intact PCL. Collaterals: Medial collateral ligament is intact. Lateral collateral ligament complex is intact. CARTILAGE Patellofemoral: Full-thickness cartilage loss of the patellar apex and medial patellar facet with subchondral reactive marrow edema. Partial-thickness cartilage loss of the lateral trochlea. Medial: Extensive full-thickness cartilage loss of the medial femorotibial compartment with severe subchondral marrow edema. Lateral: Partial-thickness cartilage loss of the lateral femorotibial compartment with mild  subchondral marrow edema. JOINT: Large joint effusion with extensive synovitis. 8.8 x 3.6 x 12.8 cm complex fluid collection between the soleus muscle and gastrocnemius muscles which appears contiguous with the posterolateral joint space at the level of the musculotendinous junction of the popliteus with numerous low signal foci within the collection consistent with air. Normal Hoffa's fat-pad. No plical thickening. POPLITEAL FOSSA: Popliteus tendon is intact. No Baker's cyst. EXTENSOR MECHANISM: Intact quadriceps tendon. Intact patellar tendon. Intact lateral patellar retinaculum. Intact medial patellar retinaculum. Intact MPFL. BONES: No aggressive osseous lesion. No fracture or dislocation. Other: Soft tissue edema circumferentially around the knee and right lower leg concerning for cellulitis. Muscle edema in the popliteus muscle, inferior aspect of the medial gastrocnemius muscle, inferior aspect of the lateral gastrocnemius muscle and the anterior compartment musculature concerning for myositis. Small prepatellar fluid collection measuring 3.4 x 2 x 0.3 cm. IMPRESSION: 1. Large joint effusion with extensive synovitis concerning for septic arthritis. 8.8 x 3.6 x 12.8 cm complex fluid collection between the soleus muscle and gastrocnemius muscles which appears contiguous with the posterolateral joint space at the level of the musculotendinous junction of the popliteus with numerous low signal foci within the collection consistent with air concerning for an abscess. Subchondral bone marrow edema centered at the medial femorotibial compartment and to lesser extent lateral femorotibial compartment concerning for developing osteomyelitis versus reactive marrow edema from surgery and chondral loss. 2. Muscle edema in the popliteus muscle, inferior aspect of the medial gastrocnemius muscle, inferior aspect of the lateral gastrocnemius muscle and the anterior compartment musculature concerning for myositis.  3.  Generalized soft tissue edema throughout the right lower leg and around the knee concerning for cellulitis. 4. Small prepatellar fluid collection measuring 3.4 x 2 x 0.3 cm likely reflecting a postoperative seroma or bursitis. 5. Tricompartmental cartilage abnormalities as described above most severe in the medial femorotibial compartment. 6. Severe attenuation of the posterior horn and body of the medial meniscus likely reflecting prior meniscectomy. 7. Complete tear of the ACL. Electronically Signed   By: Kathreen Devoid M.D.   On: 01/26/2022 09:27     Labs:   Basic Metabolic Panel: Recent Labs  Lab 01/25/22 1523 01/26/22 1052 01/28/22 0319 01/29/22 0316 01/30/22 0403  NA 138 137 137 137 136  K 3.2* 4.0 3.4* 3.7 3.8  CL 98 101 101 101 100  CO2 '27 27 28 27 28  ' GLUCOSE 217* 211* 178* 121* 194*  BUN 7 6 <5* <5* 5*  CREATININE 0.69 0.66 0.62 0.57* 0.64  CALCIUM 9.7 8.8* 8.1* 8.5* 8.6*   GFR Estimated Creatinine Clearance: 133.2 mL/min (by C-G formula based on SCr of 0.64 mg/dL). Liver Function Tests: No results for input(s): AST, ALT, ALKPHOS, BILITOT, PROT, ALBUMIN in the last 168 hours. No results for input(s): LIPASE, AMYLASE in the last 168 hours. No results for input(s): AMMONIA in the last 168 hours. Coagulation profile No results for input(s): INR, PROTIME in the last 168 hours.  CBC: Recent Labs  Lab 01/25/22 1523 01/26/22 1052 01/28/22 0319 01/29/22 0316 01/30/22 0403  WBC 10.9* 10.5 9.0 8.9 10.5  NEUTROABS 7.1 9.1* 5.4 5.3 6.6  HGB 12.6* 11.8* 10.8* 11.2* 11.4*  HCT 39.0 36.4* 32.6* 33.8* 33.8*  MCV 85.9 86.5 87.2 87.8 86.2  PLT 350 291 275 274 291   Cardiac Enzymes: Recent Labs  Lab 01/29/22 1538  CKTOTAL 43*   BNP: Invalid input(s): POCBNP CBG: Recent Labs  Lab 01/29/22 0853 01/29/22 1216 01/29/22 1555 01/29/22 2005 01/30/22 0759  GLUCAP 221* 173* 163* 237* 228*   D-Dimer No results for input(s): DDIMER in the last 72 hours. Hgb A1c No  results for input(s): HGBA1C in the last 72 hours. Lipid Profile No results for input(s): CHOL, HDL, LDLCALC, TRIG, CHOLHDL, LDLDIRECT in the last 72 hours. Thyroid function studies No results for input(s): TSH, T4TOTAL, T3FREE, THYROIDAB in the last 72 hours.  Invalid input(s): FREET3 Anemia work up No results for input(s): VITAMINB12, FOLATE, FERRITIN, TIBC, IRON, RETICCTPCT in the last 72 hours. Microbiology Recent Results (from the past 240 hour(s))  Resp Panel by RT-PCR (Flu A&B, Covid) Nasopharyngeal Swab     Status: None   Collection Time: 01/25/22  3:23 PM   Specimen: Nasopharyngeal Swab; Nasopharyngeal(NP) swabs in vial transport medium  Result Value Ref Range Status   SARS Coronavirus 2 by RT PCR NEGATIVE NEGATIVE Final    Comment: (NOTE) SARS-CoV-2 target nucleic acids are NOT DETECTED.  The SARS-CoV-2 RNA is generally detectable in upper respiratory specimens during the acute phase of infection. The lowest concentration of SARS-CoV-2 viral copies this assay can detect is 138 copies/mL. A negative result does not preclude SARS-Cov-2 infection and should not be used as the sole basis for treatment or other patient management decisions. A negative result may occur with  improper specimen collection/handling, submission of specimen other than nasopharyngeal swab, presence of viral mutation(s) within the areas targeted by this assay, and inadequate number of viral copies(<138 copies/mL). A negative result must be combined with clinical observations, patient history, and epidemiological information. The expected result is Negative.  Fact  Sheet for Patients:  EntrepreneurPulse.com.au  Fact Sheet for Healthcare Providers:  IncredibleEmployment.be  This test is no t yet approved or cleared by the [place] FDA and  has been authorized for detection and/or diagnosis of SARS-CoV-2 by FDA under an Emergency Use Authorization (EUA). This  EUA will remain  in effect (meaning this test can be used) for the duration of the COVID-19 declaration under Section 564(b)(1) of the Act, 21 U.S.C.section 360bbb-3(b)(1), unless the authorization is terminated  or revoked sooner.       Influenza A by PCR NEGATIVE NEGATIVE Final   Influenza B by PCR NEGATIVE NEGATIVE Final    Comment: (NOTE) The Xpert Xpress SARS-CoV-2/FLU/RSV plus assay is intended as an aid in the diagnosis of influenza from Nasopharyngeal swab specimens and should not be used as a sole basis for treatment. Nasal washings and aspirates are unacceptable for Xpert Xpress SARS-CoV-2/FLU/RSV testing.  Fact Sheet for Patients: EntrepreneurPulse.com.au  Fact Sheet for Healthcare Providers: IncredibleEmployment.be  This test is not yet approved or cleared by the [place] FDA and has been authorized for detection and/or diagnosis of SARS-CoV-2 by FDA under an Emergency Use Authorization (EUA). This EUA will remain in effect (meaning this test can be used) for the duration of the COVID-19 declaration under Section 564(b)(1) of the Act, 21 U.S.C. section 360bbb-3(b)(1), unless the authorization is terminated or revoked.  Performed at [facility], [address] [place], [place] [address]   Culture, blood (routine x 2)     Status: None (Preliminary result)   Collection Time: 01/25/22  3:30 PM   Specimen: BLOOD  Result Value Ref Range Status   Specimen Description   Final    BLOOD BLOOD LEFT FOREARM Performed at [facility], [address], [place], [place] [address]    Special Requests   Final    BOTTLES DRAWN AEROBIC AND ANAEROBIC Blood Culture adequate volume Performed at [facility], [address], [place], [place] [address]    Culture   Final    NO GROWTH 4 DAYS Performed at [facility], [place] [address] [place], [place] [address]    Report  Status PENDING  Incomplete  Culture, blood (routine x 2)     Status: None (Preliminary result)   Collection Time: 01/25/22  3:35 PM   Specimen: BLOOD  Result Value Ref Range Status   Specimen Description   Final    BLOOD BLOOD RIGHT WRIST Performed at [facility], [address], [place], [place] [address]    Special Requests   Final    BOTTLES DRAWN AEROBIC AND ANAEROBIC Blood Culture adequate volume Performed at [facility], [address], [place], [place] [address]    Culture   Final    NO GROWTH 4 DAYS Performed at [facility], [place] [address] [place],  [address]    Report Status PENDING  Incomplete  Surgical PCR screen     Status: None   Collection Time: 01/25/22 11:08 PM   Specimen: Nasal Mucosa; Nasal Swab  Result Value Ref Range Status   MRSA, PCR NEGATIVE NEGATIVE Final   Staphylococcus aureus NEGATIVE NEGATIVE Final    Comment: (NOTE) The Xpert SA Assay (FDA approved for NASAL specimens in patients 37 years of age and older), is one component of a comprehensive surveillance program. It is not intended to diagnose infection nor to guide or monitor treatment. Performed at [facility], [place] [address] [place], [place]  [address]   Aerobic/Anaerobic Culture w Gram Stain (surgical/deep wound)     Status: None (Preliminary result)   Collection Time: 01/26/22  8:03 AM   Specimen: PATH Other; Tissue  Result Value Ref Range Status   Specimen Description TISSUE  Final   Special Requests RIGHT KNEE ABSC SPEC A  Final   Gram Stain   Final    MODERATE WBC PRESENT,BOTH PMN AND MONONUCLEAR NO ORGANISMS SEEN    Culture   Final    NO GROWTH 4 DAYS NO ANAEROBES ISOLATED; CULTURE IN PROGRESS FOR 5 DAYS Performed at [facility], [place] [address], [place], [place] [address]    Report Status PENDING  Incomplete  Aerobic/Anaerobic Culture w Gram Stain (surgical/deep wound)     Status: None (Preliminary result)    Collection Time: 01/26/22  8:07 AM   Specimen: PATH Other; Tissue  Result Value Ref Range Status   Specimen Description TISSUE  Final   Special Requests RIGHT CALF INFECTION SPEC B  Final   Gram Stain   Final    ABUNDANT WBC PRESENT,BOTH PMN AND MONONUCLEAR NO ORGANISMS SEEN    Culture   Final    NO GROWTH 4 DAYS NO ANAEROBES ISOLATED; CULTURE IN PROGRESS FOR 5 DAYS Performed at [facility], [place] [address] [place], [place] [address]    Report Status PENDING  Incomplete  Aerobic/Anaerobic Culture w Gram Stain (surgical/deep wound)     Status: None (Preliminary result)   Collection Time: 01/26/22  8:24 AM   Specimen: PATH Other; Tissue  Result Value Ref Range Status   Specimen Description TISSUE  Final   Special Requests RIGHT KNEE ABSC SPEC C  Final   Gram Stain   Final    ABUNDANT WBC PRESENT, PREDOMINANTLY PMN RARE GRAM POSITIVE COCCI    Culture   Final    NO GROWTH 4 DAYS NO ANAEROBES ISOLATED; CULTURE IN PROGRESS FOR 5 DAYS Performed at [facility], [address], [place], [place] [address]    Report Status PENDING  Incomplete  Aerobic/Anaerobic Culture w Gram Stain (surgical/deep wound)     Status: None (Preliminary result)   Collection Time: 01/26/22  8:24 AM   Specimen: PATH Other; Tissue  Result Value Ref Range Status   Specimen Description ABSCESS  Final   Special Requests RIGHT CALF ABSC SPEC D  Final   Gram Stain   Final    ABUNDANT WBC PRESENT,BOTH PMN AND MONONUCLEAR NO ORGANISMS SEEN    Culture   Final    NO GROWTH 4 DAYS NO ANAEROBES ISOLATED; CULTURE IN PROGRESS FOR 5 DAYS Performed at [facility], [place] [address], [place], [place] [address]    Report Status PENDING  Incomplete    Time coordinating discharge: 35 minutes  Signed: Dann Ventress  [facility] 01/30/2022, 10:09 AM

## 2022-01-30 NOTE — Progress Notes (Signed)
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 ?Culture Result: Serratia marcescens and GPC ( no growth yet) ? ?Allergies  ?Allergen Reactions  ? Azithromycin Hives  ?  01/27/22 - pt states that he had a Zpak a couple years ago with no reaction  ? Clarithromycin Hives  ? Erythromycin Hives  ? Keflex [Cephalexin] Nausea Only  ? Metformin Nausea And Vomiting  ? Symbicort [Budesonide-Formoterol Fumarate] Other (See Comments)  ?  States that 3 doses were used and breathing became worse  ? ? ?OPAT Orders ?Discharge antibiotics to be given via PICC line ?Discharge antibiotics: Daptomycin 39m/kg and cefepime  ?Per pharmacy protocol  ?Duration: 6 weeks  ?End Date: 03/09/22 ?[facility] ? ?Home health RN for IV administration and teaching; PICC line care and labs.   ? ?Labs weekly while on IV antibiotics: ?_X_ CBC with differential ?_X_ BMP ?__ CMP ?_X_ CRP and ESR every 2 weeks  ?__ Vancomycin trough ?_X_ CK ? ?X_ Please pull PIC at completion of IV antibiotics ?__ Please leave PIC in place until doctor has seen patient or been notified ? ?Fax weekly labs to ([phone]? ?Clinic Follow Up Appt: 3/15  ? ?@ 2 pm with Dr MWest Bali? ?SRosiland Oz MD ?Infectious Disease Physician ?[facility] ?[address] ?[place] [address]?Phone: [phone] Fax: [phone]? ? ?

## 2022-01-30 NOTE — TOC Transition Note (Addendum)
Transition of Care (TOC) - CM/SW Discharge Note ? ? ?Patient Details  ?Name: Joseph [name] ?MRN: [id number] ?Date of Birth: 10/11/1971 ? ?Transition of Care [facility]) CM/SW Contact:  ?Epifanio Lesches, RN ?Phone Number: ?01/30/2022, 10:11 AM ? ? ?Clinical Narrative:    ?Patient will DC to: home ?Anticipated DC date: 01/30/2022 ?Family notified: yes ?Transport by: car ? ?Admitted withSerratia Marcescens septic arthritis of knee, right, R calf abscess ? ?Per MD patient ready for DC today.RN, patient, patient's family, and  Jason/[facility] and  Pam/[facility] notified of DC. Pt home infusion teaching completed. Wife to assist with care. Pt to receive today's Daptomycin dose while in hospital prior to d/c. ?Post hospital f/u noted on AVS. Pt without Rx med concerns. ?Wife to provide transportation to home. ? ?RNCM will sign off for now as intervention is no longer needed. Please consult [place] again if new needs arise.  ? ?Final next level of care: Home w Home Health Services ?Barriers to Discharge: No Barriers Identified ? ? ?Patient Goals and CMS Choice ?  ?CMS Medicare.gov Compare Post Acute Care list provided to:: Patient ?  ? ?Discharge Placement ?  ?           ?  ?  ?  ?  ? ?Discharge Plan and Services ?  ?Discharge Planning Services: CM Consult ?           ?DME Arranged: Other see comment (IV ABX therapy) ?DME Agency: Other - Comment ([facility]) ?Date DME Agency Contacted: 01/30/22 ?Time DME Agency Contacted: 1011 ?Representative spoke with at DME Agency: Pam ?HH Arranged: RN ?  ?Date HH Agency Contacted: 01/27/22 ?Time HH Agency Contacted: 1629 ?Representative spoke with at [facility] Agency: Barbara Cower ? ?Social Determinants of Health (SDOH) Interventions ?  ? ? ?Readmission Risk Interventions ?No flowsheet data found. ? ? ? ? ?

## 2022-01-31 DIAGNOSIS — J449 Chronic obstructive pulmonary disease, unspecified: Secondary | ICD-10-CM | POA: Diagnosis not present

## 2022-01-31 DIAGNOSIS — M1990 Unspecified osteoarthritis, unspecified site: Secondary | ICD-10-CM | POA: Diagnosis not present

## 2022-01-31 DIAGNOSIS — B9689 Other specified bacterial agents as the cause of diseases classified elsewhere: Secondary | ICD-10-CM | POA: Diagnosis not present

## 2022-01-31 DIAGNOSIS — M00861 Arthritis due to other bacteria, right knee: Secondary | ICD-10-CM | POA: Diagnosis not present

## 2022-01-31 DIAGNOSIS — I82401 Acute embolism and thrombosis of unspecified deep veins of right lower extremity: Secondary | ICD-10-CM | POA: Diagnosis not present

## 2022-01-31 DIAGNOSIS — E119 Type 2 diabetes mellitus without complications: Secondary | ICD-10-CM | POA: Diagnosis not present

## 2022-01-31 LAB — AEROBIC/ANAEROBIC CULTURE W GRAM STAIN (SURGICAL/DEEP WOUND)
Culture: NO GROWTH
Culture: NO GROWTH
Culture: NO GROWTH
Culture: NO GROWTH

## 2022-01-31 NOTE — Progress Notes (Signed)
?  ID Brief Note  ? ?Component 5 d ago  ?Specimen Description TISSUE   ?Special Requests RIGHT KNEE ABSC SPEC C   ?Gram Stain ABUNDANT WBC PRESENT, PREDOMINANTLY PMN  ?RARE GRAM POSITIVE COCCI   ?Culture No growth aerobically or anaerobically.  ?Performed at [facility], [address] [place], [place] [address]   ?Report Status 01/31/2022 FINAL   ? ? ?Odette Fraction, MD ?Infectious Disease Physician ?[facility] ?[address] ?[place], [place] [address] ?Phone: [phone]  Fax: [phone] ? ?

## 2022-02-04 ENCOUNTER — Telehealth: Payer: Self-pay | Admitting: Primary Care

## 2022-02-04 DIAGNOSIS — L02419 Cutaneous abscess of limb, unspecified: Secondary | ICD-10-CM | POA: Diagnosis not present

## 2022-02-04 DIAGNOSIS — B9689 Other specified bacterial agents as the cause of diseases classified elsewhere: Secondary | ICD-10-CM | POA: Diagnosis not present

## 2022-02-04 DIAGNOSIS — E119 Type 2 diabetes mellitus without complications: Secondary | ICD-10-CM | POA: Diagnosis not present

## 2022-02-04 DIAGNOSIS — I82401 Acute embolism and thrombosis of unspecified deep veins of right lower extremity: Secondary | ICD-10-CM | POA: Diagnosis not present

## 2022-02-04 DIAGNOSIS — M1990 Unspecified osteoarthritis, unspecified site: Secondary | ICD-10-CM | POA: Diagnosis not present

## 2022-02-04 DIAGNOSIS — J449 Chronic obstructive pulmonary disease, unspecified: Secondary | ICD-10-CM | POA: Diagnosis not present

## 2022-02-04 DIAGNOSIS — M00861 Arthritis due to other bacteria, right knee: Secondary | ICD-10-CM | POA: Diagnosis not present

## 2022-02-04 NOTE — Telephone Encounter (Signed)
LVM for pt to rtn my call to schedule AWV with [facility]. Please schedule AWV if pt calls the office  

## 2022-02-08 ENCOUNTER — Ambulatory Visit: Payer: Medicare Other | Admitting: Infectious Disease

## 2022-02-11 ENCOUNTER — Ambulatory Visit: Payer: Medicare Other | Admitting: Internal Medicine

## 2022-02-11 DIAGNOSIS — B9689 Other specified bacterial agents as the cause of diseases classified elsewhere: Secondary | ICD-10-CM | POA: Diagnosis not present

## 2022-02-11 DIAGNOSIS — L02419 Cutaneous abscess of limb, unspecified: Secondary | ICD-10-CM | POA: Diagnosis not present

## 2022-02-11 DIAGNOSIS — J449 Chronic obstructive pulmonary disease, unspecified: Secondary | ICD-10-CM | POA: Diagnosis not present

## 2022-02-11 DIAGNOSIS — M00861 Arthritis due to other bacteria, right knee: Secondary | ICD-10-CM | POA: Diagnosis not present

## 2022-02-11 DIAGNOSIS — M1990 Unspecified osteoarthritis, unspecified site: Secondary | ICD-10-CM | POA: Diagnosis not present

## 2022-02-11 DIAGNOSIS — E119 Type 2 diabetes mellitus without complications: Secondary | ICD-10-CM | POA: Diagnosis not present

## 2022-02-11 DIAGNOSIS — I82401 Acute embolism and thrombosis of unspecified deep veins of right lower extremity: Secondary | ICD-10-CM | POA: Diagnosis not present

## 2022-02-11 NOTE — Progress Notes (Unsigned)
Subjective:   Patient ID: Joseph [name], male    DOB: [birth year]   MRN: [id number]    Brief patient profile:  58 yowm active smoker without significant airflow obst by PFTs 06/02/12 and 03/08/2015    History of Present Illness  In April 2013 cc  had pneumonia apparently. Went to [facility] ER and found some abnormality (nodule based on review of ER notes) on CXR that resulted in CT Angio 03/07/12 same visit. PE ruled out but found to have mediastinal nodes but the cxr nodule was not there. Apparently ER doctor said it was lymphoma and asked to see an oncologist. But primary care doctor felt that was over zealous and referred patient here. He prefers conservative mgmt to nodes if clinical suspicion for cancer is low  cc lack of energy (spent most of days yesterday in bed), esp in pollen, chronic dyspnea on exertion for 180 feet relieved by rest  But slowly progressive since 2002 for which he uses albuterol prn on average 4 times a day.  Walkt test 185 feet x 3 laps: no desaturation  He smokes at baseline; trying quit, prior intolerance (dreams) with chantix.   Only baseline mild chronic cough with associtaed wheeze that is occasional.. Does have some B symptoms like nocturnal diaphoresis (soaks pillows). Denies weight loss.   CT ANGIOGRAPHY CHEST  Comparison: 03/07/2012  An abnormal right lower paratracheal node has a short axis diameter  of 1.6 cm. An abnormal AP window lymph node has a short axis  diameter of 1.4 cm. Mild bilateral hilar and infrahilar adenopathy  noted. Scattered small axillary lymph nodes are present.  A subcarinal node has a short axis diameter of 1.5 cm.     OV 08/14/2012 Mediastinal nodes: PET scan 06/10/12 shows very low uptake in the mediastinal nodes. Suspicion for cancer is very low. Lymph nodes include 13 mm right lower paratracheal lymph node and 10 mm prevascular lymph node. ACE level 04/10/12  - is in 20s and normal  Still dyspneic: 1 flught of steps and better  with rest and with albuterol and atrovent prn. Dyspne also worse in winter and cooler weathers. Rates it as moderate. STable since last visit to slightly worse. thre is some associtaed cough. No asociated weight loss. COugh is moderate and worst early in morning and in cooler temperatues.. Denies sinus drainage. PFts show mixed picture - fvc 3.4L/69%, fev1 2.9/76%, Ratio 85 but 12% bd respose. TLC 5.7/83%. DLCO 23.7/69% rec rec #MEdiastinal node  - suspicion for cancer is low  - you will need CT chest in a year; we will schedule it later  #Smoking  - glad you are working on quitting  #Shortness of breath - this is due to asthma/copd and weight  - please start QVAR 2 puff twice daily - take sample and show technique and take prescription too  - focus on weight loss through the low glycemic diet; take diet sheet from [place]  #FOllowup  - 3 months with spirometry at followup  - flu shot and pneumovax through PMD as discussed   12/17/2013  acute ov/Joseph [name] still active smoker re: recurrent pattern of coughing x a decade intermittent / out of control since nov 2014/ can't take symbicort makes it worse / on percocet one tid at baseline  Chief Complaint  Patient presents with   Acute Visit    MR pt.  C/o mostly nonprod cough with some thick white mucous, coughing until he almost passes out.  Went to [employer]  [facility] last week.    duoneb daily  Sometime skips the doses later in the day but always uses the am dose, sputum to purulent. Mostly sob with cough/ otherwise breaths ok Last neb was 4 h prior to OV   rec For Cough - use the flutter valve as much as you can plus mucinex dm 1200 mg every 12 hours as needed supplement with percocet up to 2 every 4hours For Breathing  Only use your duoneb  rescue medication . - Ok to use up to    every 4 hours if you must  Prednisone 10 mg take  4 each am x 2 days,   2 each am x 2 days,  1 each am x 2 days and stop  Pantoprazole (protonix) 40 mg   Take 30-60 min  before first meal of the day and Pepcid 20 mg one bedtime until return to [name] - this is the best way to tell whether stomach acid is contributing to your problem.  GERD  Diet . Please schedule a follow up [name] in 2 weeks, sooner if needed with all meds in hand    07/27/2014 f/u ov/Joseph [name] re: asthma / still smoking  Chief Complaint  Patient presents with   Follow-up    Pt c/o increased SOB and cough for the past month- cough is occ prod with minimal clear sputum. Pt currently taking clindamycin for staph infection. Rarely using albuterol inhaler, but is using neb approx 4 times per day.   off dulera x 1.5 week denies more sob/ cough or need for rescue on vs off rec Plan A=  dulera 200 Take 2 puffs first thing in am and then another 2 puffs about 12 hours later (or formulary alternative- call us if you find a cheaper one)  Plan B =  Backup = proaire  Plan C = Backup plan B = nebulized albuterol up to 4 hours but call if needing this much at all The key is to stop smoking completely before smoking completely stops you!    11/29/2014 f/u ov/Joseph [name] re: s/p "aecopd" still smoking  Chief Complaint  Patient presents with   Follow-up    Pt states that his SOB and cough have been worse for the past several wks. He went to ED for COPD exacerbation on 11/14/14.  He is using proair 3-4 times per day and neb about 3 times per day on average.   one month prior to flare needing proair 1-2 per day and neb at least once or twice also - using neb first thing instead of dulera  Better p pred from er then worse again w/in a week of taper off assoc with congested cough but mostly white mucus esp in ams rec Think of your respiratory medications as multiple steps you can take to control your symptoms and avoid having to go to the ER Plan A is your maintenance daily no matter what meds: dulera Take 2 puffs first thing in am and then another 2 puffs about 12 hours later.  Plan B only use after you've used  your maintenance (Plan A) medication, and only if you can't catch your breath: Proair 2 every 4 hours as needed  Plan C only use after you've used plan A and B and still can't catch your breath: nebulizer albuterol 2.5 mg up to every 4 hours  Plan D(for Doctor):  If you've used A thru C and not doing a lot better or still needing C more than  a once a day,  D = call the doctor for evaluation asap Plan E (for ER):  If still not able to catch your breath, even after using your nebulizer up to every 4 hours, go to ER  For cough > delsym 2 tsp every 12 hours is the best you can buy Prevacid 30 mg Take 30-60 min before first meal of the day  GERD diet   10/03/2015  f/u ov/Joseph [name] re: still smoking /  ? asthma vs all vcd/ gerd  Chief Complaint  Patient presents with   Follow-up    Pt states his breathing is overall doing well. He is using rescue inhaler and neb several times per day.   Wife left him in July 2016 and losing wt, dm better, breathing not since stopped dulera and never went for mct and can't afford it  No noct symptoms after hs saba/ cpap  saba use up when no access to dulera 200 but even on it he continues to perceive need for saba daily despite nl exams/ pfts rec Please see patient coordinator before you leave today  to schedule a methacholine challenge test>  POSITIVE Ok to resume dulera but stop it 24 hours before the methacholine and no albuterol w/in 6 hours  Please schedule a follow up [name] in 4 weeks, sooner if needed    COVID Nov 02 2021   12/18/2021  f/u ov/Joseph [name] re: AB maint on Breo 100 and way too much neb despite breo 200 daily  Chief Complaint  Patient presents with   Follow-up    Dx with Covid 19 Nov 01 2021. He states he has been fighting a cold and wakes up in the am with a lot of chest congestion. He has a prod cough with green/yellow sputum.    Dyspnea:  limited by R knee, surgery for R knee repair on 12/20/21 planned Cough: x sev weeks Sleeping: on side / slt  elevation  SABA use: way too much daytime use  02: none Covid status:   never vax  Rec Doxycline 100 mg twice daily x 7 days  Depomedrol 120 mg IM  No change maint rx    02/11/2022  f/u ov/Joseph [name]/Joseph [name] re: *** maint on ***  No chief complaint on file.   Dyspnea:  *** Cough: *** Sleeping: *** SABA use: *** 02: *** Covid status: *** Lung cancer screening: ***   No obvious day to day or daytime variability or assoc excess/ purulent sputum or mucus plugs or hemoptysis or cp or chest tightness, subjective wheeze or overt sinus or hb symptoms.   *** without nocturnal  or early am exacerbation  of respiratory  c/o's or need for noct saba. Also denies any obvious fluctuation of symptoms with weather or environmental changes or other aggravating or alleviating factors except as outlined above   No unusual exposure hx or h/o childhood pna/ asthma or knowledge of premature birth.  Current Allergies, Complete Past Medical History, Past Surgical History, Family History, and Social History were reviewed in [employer] record.  ROS  The following are not active complaints unless bolded Hoarseness, sore throat, dysphagia, dental problems, itching, sneezing,  nasal congestion or discharge of excess mucus or purulent secretions, ear ache,   fever, chills, sweats, unintended wt loss or wt gain, classically pleuritic or exertional cp,  orthopnea pnd or arm/hand swelling  or leg swelling, presyncope, palpitations, abdominal pain, anorexia, nausea, vomiting, diarrhea  or change in bowel habits or change in  bladder habits, change in stools or change in urine, dysuria, hematuria,  rash, arthralgias, visual complaints, headache, numbness, weakness or ataxia or problems with walking or coordination,  change in mood or  memory.        No outpatient medications have been marked as taking for the 02/11/22 encounter (Appointment) with Joseph Cowden, MD.                               Objective:   Physical Exam  Wts                  02/11/2022      ***  12/18/2021      248  08/18/2020      252  01/04/2020        251  12/31/2013  294  >    07/27/2014  286 > 11/29/2014   289> 03/08/2015  284 > 10/03/2015 266 > 10/31/2015 273 > 12/12/2015    280 > 05/23/2017  245 > 11/10/2018  245    Vital signs reviewed  02/11/2022  - Note at rest 02 sats  ***% on ***   General appearance:    ***                   Assessment & Plan:

## 2022-02-13 ENCOUNTER — Ambulatory Visit (INDEPENDENT_AMBULATORY_CARE_PROVIDER_SITE_OTHER): Payer: Medicare Other | Admitting: Infectious Diseases

## 2022-02-13 ENCOUNTER — Other Ambulatory Visit: Payer: Self-pay

## 2022-02-13 VITALS — BP 124/78 | HR 111 | Temp 98.4°F

## 2022-02-13 DIAGNOSIS — A488 Other specified bacterial diseases: Secondary | ICD-10-CM | POA: Diagnosis not present

## 2022-02-13 DIAGNOSIS — F17200 Nicotine dependence, unspecified, uncomplicated: Secondary | ICD-10-CM | POA: Diagnosis not present

## 2022-02-13 DIAGNOSIS — Z5181 Encounter for therapeutic drug level monitoring: Secondary | ICD-10-CM | POA: Diagnosis not present

## 2022-02-13 DIAGNOSIS — M869 Osteomyelitis, unspecified: Secondary | ICD-10-CM | POA: Diagnosis not present

## 2022-02-13 DIAGNOSIS — M009 Pyogenic arthritis, unspecified: Secondary | ICD-10-CM | POA: Diagnosis not present

## 2022-02-13 NOTE — Progress Notes (Addendum)
? ?   ? ?Patient Active Problem List  ? Diagnosis Date Noted  ? Calf abscess   ? Septic arthritis of knee ([place]) 01/25/2022  ? Septic arthritis of knee, right ([place]) 01/09/2022  ? DVT (deep venous thrombosis) ([place]) 01/09/2022  ? Preoperative clearance 11/20/2021  ? Post-viral cough syndrome 11/20/2021  ? COVID-19 10/31/2021  ? Cervical lymphadenitis 11/27/2020  ? Preventative health care 07/05/2020  ? Leukocytosis 05/04/2020  ? Chronic fatigue 05/03/2020  ? Medicare annual wellness visit, subsequent 06/28/2019  ? Controlled substance agreement broken 09/14/2018  ? Primary osteoarthritis of knee 07/21/2018  ? Status post unicompartmental knee replacement, left 07/21/2018  ? Medicare annual wellness visit, initial 05/20/2018  ? Effusion of knee joint (Left) 04/21/2018  ? Opiate overdose ([place]) 03/24/2018  ? Arthropathy of knee (Left) 01/12/2018  ? Opioid-induced constipation ([facility]) 01/12/2018  ? Tinea corporis 01/07/2018  ? Tinea versicolor 01/07/2018  ? Pharmacologic therapy 12/08/2017  ? Problems influencing health status 12/08/2017  ? Long term prescription opiate use 12/08/2017  ? Opiate use 12/08/2017  ? Insomnia 11/20/2017  ? Disorder of skeletal system 11/05/2017  ? Chronic knee pain (Primary Area of Pain) (Bilateral) (L>R) 11/05/2017  ? Chronic wrist pain (Secondary Area of Pain) (Left) 11/05/2017  ? Chronic constipation 08/18/2017  ? Dyslipidemia 05/12/2017  ? COPD without exacerbation ([place]) 05/07/2017  ? Male hypogonadism 04/24/2017  ? Post-void dribbling 04/01/2017  ? Osteoarthritis of the knee (Left)   ? GERD (gastroesophageal reflux disease) 03/12/2017  ? Bucket handle tear of meniscus of knee (Right)   ? Anxiety and depression 10/07/2015  ? Class 1 obesity due to excess calories with body mass index (BMI) of 33.0 to 33.9 in adult 10/06/2015  ? OSA (obstructive sleep apnea) 08/31/2014  ? Testosterone deficiency 07/01/2014  ? S/P knee surgery 04/26/2014  ? Meniscal tear of knee, sequela (Left) 04/26/2014  ?  Medial meniscus, posterior horn derangement 04/22/2014  ? Intrinsic asthma 12/19/2013  ? Chronic pain syndrome 05/13/2013  ? Type 2 diabetes mellitus with hyperglycemia ([place]) 05/13/2013  ? Mediastinal abnormality 04/10/2012  ? Cigarette smoker 04/10/2012  ? ? ?Patient's Medications  ?New Prescriptions  ? No medications on file  ?Previous Medications  ? ALBUTEROL (PROVENTIL) (2.5 MG/3ML) 0.083% NEBULIZER SOLUTION    USE 3 ML(1 VIAL) VIA NEBULIZER FOUR TIMES DAILY AS NEEDED FOR SHORTNESS OF BREATH  ? BLOOD GLUCOSE METER KIT AND SUPPLIES KIT    Dispense based on patient and insurance preference. Use up to four times daily as directed. (FOR ICD-9 250.00, 250.01).  ? BUPROPION (WELLBUTRIN SR) 150 MG 12 HR TABLET    Take 1 tablet (150 mg total) by mouth 2 (two) times daily. For anxiety and depression.  ? CEFEPIME (MAXIPIME) IVPB    Inject 2 g into the vein every 8 (eight) hours. Indication:  R-knee PJI ?First Dose: Yes ?Last Day of Therapy:  03/09/22 ?Labs - Once weekly:  CBC/D and BMP, ?Labs - Every other week:  ESR and CRP ?Method of administration: IV Push ?Method of administration may be changed at the discretion of home infusion pharmacist based upon assessment of the patient and/or caregiver's ability to self-administer the medication ordered.  ? CETIRIZINE (ZYRTEC) 10 MG TABLET    Take 1 tablet by mouth at bedtime for allergies.  ? CITALOPRAM (CELEXA) 40 MG TABLET    Take 1 tablet (40 mg total) by mouth daily. For anxiety and depression.  ? CONTINUOUS BLOOD GLUC SENSOR (FREESTYLE LIBRE 3 SENSOR) MISC  1 Units by Does not apply route 4 (four) times daily. Place 1 sensor on the skin every 14 days. Use to check glucose continuously  ? CYCLOBENZAPRINE (FLEXERIL) 10 MG TABLET    Take 10 mg by mouth 2 (two) times daily as needed.  ? DAPTOMYCIN (CUBICIN) IVPB    Inject 700 mg into the vein daily. Indication:  R-knee PJI ?First Dose: Yes ?Last Day of Therapy:  03/09/22 ?Labs - Once weekly:  CBC/D, BMP ?Labs - Every other  week:  ESR, CRP, and CPK ?Method of administration: IV Push ?Method of administration may be changed at the discretion of home infusion pharmacist based upon assessment of the patient and/or caregiver's ability to self-administer the medication ordered.  ? EMPAGLIFLOZIN (JARDIANCE) 10 MG TABS TABLET    Take 1 tablet (10 mg total) by mouth daily. For diabetes.  ? FLUTICASONE (FLONASE) 50 MCG/ACT NASAL SPRAY    Place 2 sprays into both nostrils daily as needed for allergies or rhinitis.  ? FLUTICASONE FUROATE-VILANTEROL (BREO ELLIPTA) 200-25 MCG/INH AEPB    Inhale 1 puff into the lungs daily.  ? GABAPENTIN (NEURONTIN) 300 MG CAPSULE    Take 2 capsules (600 mg total) by mouth 2 (two) times daily. For pain.  ? GLIPIZIDE (GLUCOTROL) 10 MG TABLET    Take 1 tablet (10 mg total) by mouth 2 (two) times daily before a meal. For diabetes.  ? INSULIN DEGLUDEC (TRESIBA) 100 UNIT/ML FLEXTOUCH PEN    Inject 35 Units into the skin daily. For diabetes.  ? INSULIN PEN NEEDLE (UNIFINE PENTIPS) 31G X 6 MM MISC    USE NIGHTLY WITH INSULIN  ? IPRATROPIUM-ALBUTEROL (COMBIVENT RESPIMAT) 20-100 MCG/ACT AERS RESPIMAT    INHALE ONE PUFF INTO THE LUNGS EVERY FOUR HOURS AS NEEDED FOR WHEEZING  ? LANCETS (ONETOUCH DELICA PLUS [id number]) MISC    USE TO CHECK BLOOD SUGAR UP TO FOUR TIMES DAILY  ? LANSOPRAZOLE (PREVACID) 30 MG CAPSULE    Take 1 capsule (30 mg total) by mouth 2 (two) times daily before a meal. For heartburn.  ? METFORMIN (GLUCOPHAGE-XR) 500 MG 24 HR TABLET    Take 2 tablets (1,000 mg total) by mouth 2 (two) times daily with a meal. For diabetes.  ? METHOCARBAMOL (ROBAXIN) 500 MG TABLET    Take 500 mg by mouth at bedtime.  ? MOVANTIK 25 MG TABS TABLET    Take 25 mg by mouth every morning.  ? NUCYNTA 75 MG TABLET    Take 75 mg by mouth 4 (four) times daily as needed (pain).  ? ONETOUCH VERIO TEST STRIP    USE TO CHECK BLOOD SUGAR UP TO FOUR TIMES DAILY  ? OVER THE COUNTER MEDICATION    Take 2 tablets by mouth every morning. Osteo  Biflex triple strength with turmeric  ? OVER THE COUNTER MEDICATION    Take 1 tablet by mouth every morning. Neuriva  ? RIVAROXABAN (XARELTO) 20 MG TABS TABLET    Take 1 tablet (20 mg total) by mouth daily with supper.  ? TADALAFIL (CIALIS) 20 MG TABLET    Take 20 mg by mouth daily as needed for erectile dysfunction.  ? TAMSULOSIN (FLOMAX) 0.4 MG CAPS CAPSULE    TAKE ONE CAPSULE BY MOUTH DAILY  ? TESTOSTERONE 40.5 MG/2.5GM (1.62%) GEL    Place 2.5 g onto the skin every morning. 2 pumps - 2.5 g  ? ZOLPIDEM (AMBIEN) 10 MG TABLET    TAKE ONE TABLET (10MG TOTAL) BY MOUTH ATBEDTIME AS NEEDED FOR  SLEEP  ?Modified Medications  ? No medications on file  ?Discontinued Medications  ? No medications on file  ? ? ?Subjective: ?Here for HFU for recurrent rt knee septic arthritis. Getting IV daptomycin and cefepime through rt arm PICC. Rt knee pain is 8/10 in the morning and 10/10 later, takes neusynta and oxycodone three times. Denies fevers, chills and sweats. Denies nausea, vomiting and diarrhea. Saw Orthopedics Dr Percell Miller On Monday and there is a  plan for Left TKR in the near future. No concerns otherwise. ? ?Review of Systems: ?ROS all systems reviewed and negative except as stated above  ? ?Past Medical History:  ?Diagnosis Date  ? Acute encephalopathy 03/24/2018  ? Anxiety   ? Arthritis   ? oa left knee and lower back  ? Asthma   ? Cataract   ? hx of bilateral  ? CHF (congestive heart failure) ([place])   ? Chronic back pain   ? Chronic pain of left knee   ? COPD (chronic obstructive pulmonary disease) ([place])   ? Diabetes mellitus   ? type 2  ? DVT (deep venous thrombosis) ([place]) 01/09/2022  ? GERD (gastroesophageal reflux disease)   ? INGUINAL PAIN, LEFT 10/03/2009  ? Qualifier: Diagnosis of  By: Oneida Alar MD, Sandi L   ? Oral thrush 11/10/2018  ? Overdose   ? 03-24-18 accidental  ? Rash and nonspecific skin eruption 03/12/2017  ? Septic arthritis ([place]) 01/09/2022  ? Shortness of breath   ? with heavy exertion  ? Sleep apnea    ? ?Past Surgical History:  ?Procedure Laterality Date  ? CARPAL TUNNEL RELEASE Left   ? CATARACT EXTRACTION W/PHACO Right 03/11/2016  ? Procedure: CATARACT EXTRACTION PHACO AND INTRAOCULAR LENS PLACEMENT ([place]);  S

## 2022-02-14 ENCOUNTER — Telehealth: Payer: Self-pay

## 2022-02-14 DIAGNOSIS — M869 Osteomyelitis, unspecified: Secondary | ICD-10-CM

## 2022-02-14 DIAGNOSIS — A488 Other specified bacterial diseases: Secondary | ICD-10-CM | POA: Insufficient documentation

## 2022-02-14 DIAGNOSIS — Z5181 Encounter for therapeutic drug level monitoring: Secondary | ICD-10-CM | POA: Insufficient documentation

## 2022-02-14 HISTORY — DX: Other specified bacterial diseases: A48.8

## 2022-02-14 HISTORY — DX: Osteomyelitis, unspecified: M86.9

## 2022-02-14 NOTE — Progress Notes (Signed)
Diagnosis: septic arthritis and osteomyelitis ?Culture Result: serratia marcescens ? ?Allergies  ?Allergen Reactions  ? Azithromycin Hives  ?  01/27/22 - pt states that he had a Zpak a couple years ago with no reaction  ? Clarithromycin Hives  ? Erythromycin Hives  ? Keflex [Cephalexin] Nausea Only  ? Metformin Nausea And Vomiting  ? Symbicort [Budesonide-Formoterol Fumarate] Other (See Comments)  ?  States that 3 doses were used and breathing became worse  ? ? ?OPAT Orders ?Discharge antibiotics to be given via PICC line ?Discharge antibiotics: cefepime  ?Per pharmacy protocol  ?Aim for Vancomycin trough 15-20 or AUC 400-550 (unless otherwise indicated) ?End date: 03/09/22 ? ?[facility] Care Per Protocol: ? ?Home health RN for IV administration and teaching; PICC line care and labs.   ? ?Labs weekly while on IV antibiotics: ?X__ CBC with differential ?X__ BMP ?__ CMP ?__ CRP and ESR every 2 weeks ?__ Vancomycin trough ?__ CK ? ?X_ Please pull PIC at completion of IV antibiotics ?__ Please leave PIC in place until doctor has seen patient or been notified ? ?Fax weekly labs to [phone] ? ?Clinic Follow Up Appt: 03/05/22 ? ?

## 2022-02-14 NOTE — Telephone Encounter (Signed)
-----   Message from Odette Fraction, MD sent at 02/14/2022  8:31 AM EDT ----- ?Regarding: OPAT updated ?Hi team,  ? ?I have Dc'ed daptomycin for this patient ?Cefepime until 03/09/22 in new OPAT  ? ?Thanks  ? ?

## 2022-02-14 NOTE — Telephone Encounter (Signed)
Thank you! We have updated our OPAT spreadsheet.

## 2022-02-14 NOTE — Telephone Encounter (Signed)
I spoke to the patient and advised him that Dr. Elinor Parkinson would like him to stop the daptomycin and he will only be doing the cefepime.  ?Patient verbalized understanding. ? ?I have also spoke to Amy with [employer] and gave orders that patient will be stopping daptomycin and only be doing cefepime per Dr. Elinor Parkinson. I also advised Amy of patient end day of 03/09/22 for IV antibiotics. ? ?Onalee Steinbach T Takara Sermons ? ?

## 2022-02-15 DIAGNOSIS — E1165 Type 2 diabetes mellitus with hyperglycemia: Secondary | ICD-10-CM | POA: Diagnosis not present

## 2022-02-15 DIAGNOSIS — Z794 Long term (current) use of insulin: Secondary | ICD-10-CM | POA: Diagnosis not present

## 2022-02-15 DIAGNOSIS — Z452 Encounter for adjustment and management of vascular access device: Secondary | ICD-10-CM | POA: Diagnosis not present

## 2022-02-15 DIAGNOSIS — Z7984 Long term (current) use of oral hypoglycemic drugs: Secondary | ICD-10-CM | POA: Diagnosis not present

## 2022-02-15 DIAGNOSIS — M1990 Unspecified osteoarthritis, unspecified site: Secondary | ICD-10-CM | POA: Diagnosis not present

## 2022-02-15 DIAGNOSIS — I5032 Chronic diastolic (congestive) heart failure: Secondary | ICD-10-CM | POA: Diagnosis not present

## 2022-02-15 DIAGNOSIS — L02415 Cutaneous abscess of right lower limb: Secondary | ICD-10-CM | POA: Diagnosis not present

## 2022-02-15 DIAGNOSIS — M00861 Arthritis due to other bacteria, right knee: Secondary | ICD-10-CM | POA: Diagnosis not present

## 2022-02-15 DIAGNOSIS — E785 Hyperlipidemia, unspecified: Secondary | ICD-10-CM | POA: Diagnosis not present

## 2022-02-15 DIAGNOSIS — Z5181 Encounter for therapeutic drug level monitoring: Secondary | ICD-10-CM | POA: Diagnosis not present

## 2022-02-15 DIAGNOSIS — I82461 Acute embolism and thrombosis of right calf muscular vein: Secondary | ICD-10-CM | POA: Diagnosis not present

## 2022-02-15 DIAGNOSIS — G894 Chronic pain syndrome: Secondary | ICD-10-CM | POA: Diagnosis not present

## 2022-02-15 DIAGNOSIS — F32A Depression, unspecified: Secondary | ICD-10-CM | POA: Diagnosis not present

## 2022-02-15 DIAGNOSIS — B9689 Other specified bacterial agents as the cause of diseases classified elsewhere: Secondary | ICD-10-CM | POA: Diagnosis not present

## 2022-02-15 DIAGNOSIS — Z7901 Long term (current) use of anticoagulants: Secondary | ICD-10-CM | POA: Diagnosis not present

## 2022-02-15 DIAGNOSIS — K5903 Drug induced constipation: Secondary | ICD-10-CM | POA: Diagnosis not present

## 2022-02-15 DIAGNOSIS — J449 Chronic obstructive pulmonary disease, unspecified: Secondary | ICD-10-CM | POA: Diagnosis not present

## 2022-02-15 DIAGNOSIS — K219 Gastro-esophageal reflux disease without esophagitis: Secondary | ICD-10-CM | POA: Diagnosis not present

## 2022-02-15 DIAGNOSIS — E6609 Other obesity due to excess calories: Secondary | ICD-10-CM | POA: Diagnosis not present

## 2022-02-15 DIAGNOSIS — F17210 Nicotine dependence, cigarettes, uncomplicated: Secondary | ICD-10-CM | POA: Diagnosis not present

## 2022-02-15 DIAGNOSIS — T8142XA Infection following a procedure, deep incisional surgical site, initial encounter: Secondary | ICD-10-CM | POA: Diagnosis not present

## 2022-02-15 DIAGNOSIS — T402X5D Adverse effect of other opioids, subsequent encounter: Secondary | ICD-10-CM | POA: Diagnosis not present

## 2022-02-15 DIAGNOSIS — G4733 Obstructive sleep apnea (adult) (pediatric): Secondary | ICD-10-CM | POA: Diagnosis not present

## 2022-02-15 DIAGNOSIS — Z792 Long term (current) use of antibiotics: Secondary | ICD-10-CM | POA: Diagnosis not present

## 2022-02-15 DIAGNOSIS — F419 Anxiety disorder, unspecified: Secondary | ICD-10-CM | POA: Diagnosis not present

## 2022-02-18 DIAGNOSIS — L02415 Cutaneous abscess of right lower limb: Secondary | ICD-10-CM | POA: Diagnosis not present

## 2022-02-18 DIAGNOSIS — I82461 Acute embolism and thrombosis of right calf muscular vein: Secondary | ICD-10-CM | POA: Diagnosis not present

## 2022-02-18 DIAGNOSIS — M00861 Arthritis due to other bacteria, right knee: Secondary | ICD-10-CM | POA: Diagnosis not present

## 2022-02-18 DIAGNOSIS — J449 Chronic obstructive pulmonary disease, unspecified: Secondary | ICD-10-CM | POA: Diagnosis not present

## 2022-02-18 DIAGNOSIS — T8142XA Infection following a procedure, deep incisional surgical site, initial encounter: Secondary | ICD-10-CM | POA: Diagnosis not present

## 2022-02-18 DIAGNOSIS — B9689 Other specified bacterial agents as the cause of diseases classified elsewhere: Secondary | ICD-10-CM | POA: Diagnosis not present

## 2022-02-19 DIAGNOSIS — M2241 Chondromalacia patellae, right knee: Secondary | ICD-10-CM | POA: Diagnosis not present

## 2022-02-21 ENCOUNTER — Other Ambulatory Visit: Payer: Self-pay

## 2022-02-21 ENCOUNTER — Encounter: Payer: Self-pay | Admitting: Primary Care

## 2022-02-21 ENCOUNTER — Ambulatory Visit (INDEPENDENT_AMBULATORY_CARE_PROVIDER_SITE_OTHER): Payer: Medicare Other | Admitting: Primary Care

## 2022-02-21 VITALS — BP 124/78 | HR 77 | Temp 98.6°F | Ht 70.0 in | Wt 231.0 lb

## 2022-02-21 DIAGNOSIS — Z794 Long term (current) use of insulin: Secondary | ICD-10-CM | POA: Diagnosis not present

## 2022-02-21 DIAGNOSIS — F419 Anxiety disorder, unspecified: Secondary | ICD-10-CM

## 2022-02-21 DIAGNOSIS — A488 Other specified bacterial diseases: Secondary | ICD-10-CM | POA: Diagnosis not present

## 2022-02-21 DIAGNOSIS — I82409 Acute embolism and thrombosis of unspecified deep veins of unspecified lower extremity: Secondary | ICD-10-CM | POA: Diagnosis not present

## 2022-02-21 DIAGNOSIS — E1165 Type 2 diabetes mellitus with hyperglycemia: Secondary | ICD-10-CM | POA: Diagnosis not present

## 2022-02-21 DIAGNOSIS — F32A Depression, unspecified: Secondary | ICD-10-CM | POA: Diagnosis not present

## 2022-02-21 MED ORDER — HYDROXYZINE HCL 10 MG PO TABS: 10.0000 mg | ORAL_TABLET | Freq: Two times a day (BID) | ORAL | 0 refills | Status: AC | PRN

## 2022-02-21 NOTE — Assessment & Plan Note (Signed)
Improving with A1C of 7.5 in February 2023.  ? ?Continue Jardiance 10 mg daily, Glipizide 10 mg BID, Metformin XR 1000 mg BID, Tresiba 35 units daily. ? ?Follow up in 3 months. ?

## 2022-02-21 NOTE — Assessment & Plan Note (Signed)
Following with infectious disease and orthopedics. ? ?Hospital notes, labs, imaging reviewed from January 2023 through  March 2023. ? ?Continue IV antibiotics per PICC line per ID. ?Continue Xarelto 20 mg daily for DVT. ? ?Today his surgical sites appear to be improving. ?

## 2022-02-21 NOTE — Assessment & Plan Note (Signed)
Deteriorated given recent events of knee surgery coupled with DVT and calf abscess. ? ?Continue bupropion SR 150 mg twice daily, citalopram 40 mg daily, zolpidem 10 mg at bedtime. ? ?Add hydroxyzine 10 to 20 mg twice daily as needed. ?Recommended therapy, he will think about this. ?

## 2022-02-21 NOTE — Patient Instructions (Signed)
You may take the hydroxyzine tablets twice daily as needed for anxiety.  You can take 1 or 2 tablets at a time. ? ?Please update me if no improvement. ? ?Please set up a 43-month follow-up visit for diabetes check. ? ?It was a pleasure to see you today! ? ?

## 2022-02-21 NOTE — Assessment & Plan Note (Signed)
Continue Xarelto 20 mg daily. ?Will be taking for a total of 3 months then discontinue. ? ?Hospital notes reviewed from February 2023. ?

## 2022-02-21 NOTE — Progress Notes (Signed)
? ?Subjective:  ? ? Patient ID: Joseph [name], male    DOB: [birth year], 51 y.o.   MRN: [id number] ? ?HPI ? ?Joseph [name] is a very pleasant 51 y.o. male with a significant medical history including OSA, COPD, type 2 diabetes, chronic constipation, osteoarthritis, chronic knee pain, chronic pain syndrome, tobacco abuse, anxiety/depression, testosterone deficiency who presents today  ? ?He underwent right meniscectomy per Dr. Percell Miller on 12/20/21, developed an infection one week later and underwent R knee I&D, initially treated with doxycycline orally, then switched to ceftriaxone 2 g IV daily given positive blood culture results, PICC line placed 01/15/2022.  Was also found to have DVT on 01/04/2022, Xarelto initiated.  Evaluated by orthopedics on 01/25/2022 for right knee and calf swelling, 10 cc of puss-like fluid drained, patient was readmitted and underwent I&D of right calf abscess, irrigation and debridement of right knee joint with partial synovectomy.  Patient discharged home on 01/30/2022 with recommendations to continue cefepime and daptomycin IV through PICC line. ? ?Recent A1c of 7.5, checked during hospital stay on 01/26/2022. During his hospital stay his Glipizide, Januvia, metformin were held, was provided [place] and SSI.  ? ?Today he'd like to discuss increased anxiety given recent events. He's been out of work since October 2022, cannot walk much and is mostly using a wheelchair. Is concerned about finances. He will be getting started on outpatient PT soon.  ? ?He is currently managed on bupropion SR 150 mg twice daily, citalopram 40 mg daily, Zolpidem 10 mg at bedtime. Felt well managed on this regimen prior to his surgery. Now he's feeling increased anxiety, feeling more depressed, tearfulness, frustration.  ? ?He will eventually undergo a total right knee replacement, unsure when this will occur. Following with infectious disease who is managing antibiotics.  He is compliant to Xarelto, plan is to take  for a total of three months.  ? ?Review of Systems  ?Cardiovascular:  Positive for leg swelling.  ?Musculoskeletal:  Positive for arthralgias.  ?Skin:  Positive for color change.  ?Psychiatric/Behavioral:  Positive for sleep disturbance. The patient is nervous/anxious.   ? ?   ? ? ?Past Medical History:  ?Diagnosis Date  ? Acute encephalopathy 03/24/2018  ? Anxiety   ? Arthritis   ? oa left knee and lower back  ? Asthma   ? Cataract   ? hx of bilateral  ? CHF (congestive heart failure) ([place])   ? Chronic back pain   ? Chronic pain of left knee   ? COPD (chronic obstructive pulmonary disease) ([place])   ? Diabetes mellitus   ? type 2  ? DVT (deep venous thrombosis) ([place]) 01/09/2022  ? GERD (gastroesophageal reflux disease)   ? INGUINAL PAIN, LEFT 10/03/2009  ? Qualifier: Diagnosis of  By: Oneida Alar MD, Sandi L   ? Oral thrush 11/10/2018  ? Overdose   ? 03-24-18 accidental  ? Rash and nonspecific skin eruption 03/12/2017  ? Septic arthritis ([place]) 01/09/2022  ? Shortness of breath   ? with heavy exertion  ? Sleep apnea   ? ? ?Social History  ? ?Socioeconomic History  ? Marital status: Divorced  ?  Spouse name: Not on file  ? Number of children: Not on file  ? Years of education: Not on file  ? Highest education level: Not on file  ?Occupational History  ? Occupation: disability  ?Tobacco Use  ? Smoking status: Every Day  ?  Packs/day: 1.25  ?  Years: 26.00  ?  Pack  years: 32.50  ?  Types: Cigarettes  ?  Start date: 12/17/1993  ? Smokeless tobacco: Never  ? Tobacco comments:  ?  1 ppd 01/04/2020  ?Vaping Use  ? Vaping Use: Former  ?Substance and Sexual Activity  ? Alcohol use: Never  ?  Alcohol/week: 0.0 standard drinks  ? Drug use: Never  ? Sexual activity: Yes  ?  Birth control/protection: Surgical  ?Other Topics Concern  ? Not on file  ?Social History Narrative  ? Recent visit to [facility] in [place] d/t increased pain where he was prescribed percocet 7.5-325 mg for pain.  ? ?Social Determinants of Health  ? ?Financial Resource  Strain: Not on file  ?Food Insecurity: Not on file  ?Transportation Needs: Not on file  ?Physical Activity: Not on file  ?Stress: Not on file  ?Social Connections: Not on file  ?Intimate Partner Violence: Not on file  ? ? ?Past Surgical History:  ?Procedure Laterality Date  ? CARPAL TUNNEL RELEASE Left   ? CATARACT EXTRACTION W/PHACO Right 03/11/2016  ? Procedure: CATARACT EXTRACTION PHACO AND INTRAOCULAR LENS PLACEMENT (IOC);  Surgeon: Tonny Branch, MD;  Location: [facility];  Service: Ophthalmology;  Laterality: Right;  CDE 4.24  ? EYE SURGERY Right 2018  ? ioc with lens replacement  ? HERNIA REPAIR Bilateral 3976  ? umbilical  ? INCISION AND DRAINAGE ABSCESS Right 01/02/2022  ? Procedure: RIGHT KNEE ARTHROSCOPIC IRRIGATION AND DEBRIDEMENT;  Surgeon: Willaim Sheng, MD;  Location: [facility];  Service: Orthopedics;  Laterality: Right;  ? INCISION AND DRAINAGE ABSCESS Right 01/26/2022  ? Procedure: INCISION AND DRAINAGE ABSCESS POST OP RIGHT KNEE ARTHROSCOPY;  Surgeon: Willaim Sheng, MD;  Location: [place];  Service: Orthopedics;  Laterality: Right;  ? KNEE ARTHROSCOPY WITH MEDIAL MENISECTOMY Left 04/22/2014  ? Procedure: LEFT KNEE ARTHROSCOPY WITH MEDIAL MENISECTOMY;  Surgeon: Carole Civil, MD;  Location: [facility];  Service: Orthopedics;  Laterality: Left;  ? KNEE ARTHROSCOPY WITH MEDIAL MENISECTOMY Right 12/06/2015  ? Procedure: RIGHT KNEE ARTHROSCOPY WITH MEDIAL MENISECTOMY;  Surgeon: Carole Civil, MD;  Location: [facility];  Service: Orthopedics;  Laterality: Right;  ? KNEE ARTHROSCOPY WITH MEDIAL MENISECTOMY Left 03/20/2017  ? Procedure: KNEE ARTHROSCOPY WITH MEDIAL MENISECTOMY;  Surgeon: Carole Civil, MD;  Location: [facility];  Service: Orthopedics;  Laterality: Left;  ? ORIF WRIST FRACTURE Left 12/19/2016  ? Procedure: OPEN REDUCTION INTERNAL FIXATION (ORIF) WRIST FRACTURE;  Surgeon: Leanora Cover, MD;  Location: [place];  Service: Orthopedics;  Laterality: Left;  accumed    ? PARTIAL KNEE ARTHROPLASTY Left 07/21/2018  ? Procedure: LEFT UNICOMPARTMENTAL KNEE;  Surgeon: Renette Butters, MD;  Location: [facility];  Service: Orthopedics;  Laterality: Left;  ? SHOULDER ARTHROCENTESIS Right 2008 or 2010  ? VASECTOMY  2014  ? ? ?Family History  ?Problem Relation Age of Onset  ? Emphysema Maternal Grandfather   ? Emphysema Maternal Grandmother   ? Clotting disorder Maternal Grandmother   ? ? ?Allergies  ?Allergen Reactions  ? Azithromycin Hives  ?  01/27/22 - pt states that he had a Zpak a couple years ago with no reaction  ? Clarithromycin Hives  ? Erythromycin Hives  ? Keflex [Cephalexin] Nausea Only  ? Metformin Nausea And Vomiting  ? Symbicort [Budesonide-Formoterol Fumarate] Other (See Comments)  ?  States that 3 doses were used and breathing became worse  ? ? ?Current Outpatient Medications on File Prior to Visit  ?Medication Sig Dispense Refill  ? albuterol (PROVENTIL) (  2.5 MG/3ML) 0.083% nebulizer solution USE 3 ML(1 VIAL) VIA NEBULIZER FOUR TIMES DAILY AS NEEDED FOR SHORTNESS OF BREATH (Patient taking differently: Take 2.5 mg by nebulization 2 (two) times daily.) 360 mL 0  ? blood glucose meter kit and supplies KIT Dispense based on patient and insurance preference. Use up to four times daily as directed. (FOR ICD-9 250.00, 250.01). 1 each 0  ? buPROPion (WELLBUTRIN SR) 150 MG 12 hr tablet Take 1 tablet (150 mg total) by mouth 2 (two) times daily. For anxiety and depression. 180 tablet 3  ? ceFEPime (MAXIPIME) IVPB Inject 2 g into the vein every 8 (eight) hours. Indication:  R-knee PJI ?First Dose: Yes ?Last Day of Therapy:  03/09/22 ?Labs - Once weekly:  CBC/D and BMP, ?Labs - Every other week:  ESR and CRP ?Method of administration: IV Push ?Method of administration may be changed at the discretion of home infusion pharmacist based upon assessment of the patient and/or caregiver's ability to self-administer the medication ordered. 120 Units 0  ? cetirizine (ZYRTEC) 10 MG tablet Take 1  tablet by mouth at bedtime for allergies. (Patient taking differently: Take 10 mg by mouth every morning. For allergies) 90 tablet 2  ? citalopram (CELEXA) 40 MG tablet Take 1 tablet (40 mg total) by mou

## 2022-02-25 ENCOUNTER — Telehealth: Payer: Self-pay

## 2022-02-25 DIAGNOSIS — M00861 Arthritis due to other bacteria, right knee: Secondary | ICD-10-CM | POA: Diagnosis not present

## 2022-02-25 DIAGNOSIS — T8142XA Infection following a procedure, deep incisional surgical site, initial encounter: Secondary | ICD-10-CM | POA: Diagnosis not present

## 2022-02-25 DIAGNOSIS — B9689 Other specified bacterial agents as the cause of diseases classified elsewhere: Secondary | ICD-10-CM | POA: Diagnosis not present

## 2022-02-25 DIAGNOSIS — L02415 Cutaneous abscess of right lower limb: Secondary | ICD-10-CM | POA: Diagnosis not present

## 2022-02-25 DIAGNOSIS — I82461 Acute embolism and thrombosis of right calf muscular vein: Secondary | ICD-10-CM | POA: Diagnosis not present

## 2022-02-25 DIAGNOSIS — J449 Chronic obstructive pulmonary disease, unspecified: Secondary | ICD-10-CM | POA: Diagnosis not present

## 2022-02-25 NOTE — Progress Notes (Signed)
? ? ?Chronic Care Management ?Pharmacy Assistant  ? ?Name: Joseph [name]  MRN: [id number] DOB: [birth year] ? ?Reason for Encounter: CCM Counsellor) ?  ?Medications: ?Outpatient Encounter Medications as of 02/25/2022  ?Medication Sig Note  ? albuterol (PROVENTIL) (2.5 MG/3ML) 0.083% nebulizer solution USE 3 ML(1 VIAL) VIA NEBULIZER FOUR TIMES DAILY AS NEEDED FOR SHORTNESS OF BREATH (Patient taking differently: Take 2.5 mg by nebulization 2 (two) times daily.)   ? blood glucose meter kit and supplies KIT Dispense based on patient and insurance preference. Use up to four times daily as directed. (FOR ICD-9 250.00, 250.01).   ? buPROPion (WELLBUTRIN SR) 150 MG 12 hr tablet Take 1 tablet (150 mg total) by mouth 2 (two) times daily. For anxiety and depression.   ? ceFEPime (MAXIPIME) IVPB Inject 2 g into the vein every 8 (eight) hours. Indication:  R-knee PJI ?First Dose: Yes ?Last Day of Therapy:  03/09/22 ?Labs - Once weekly:  CBC/D and BMP, ?Labs - Every other week:  ESR and CRP ?Method of administration: IV Push ?Method of administration may be changed at the discretion of home infusion pharmacist based upon assessment of the patient and/or caregiver's ability to self-administer the medication ordered.   ? cetirizine (ZYRTEC) 10 MG tablet Take 1 tablet by mouth at bedtime for allergies. (Patient taking differently: Take 10 mg by mouth every morning. For allergies)   ? citalopram (CELEXA) 40 MG tablet Take 1 tablet (40 mg total) by mouth daily. For anxiety and depression. (Patient taking differently: Take 40 mg by mouth every morning. For anxiety and depression.)   ? Continuous Blood Gluc Sensor (FREESTYLE LIBRE 3 SENSOR) MISC 1 Units by Does not apply route 4 (four) times daily. Place 1 sensor on the skin every 14 days. Use to check glucose continuously   ? cyclobenzaprine (FLEXERIL) 10 MG tablet Take 10 mg by mouth 2 (two) times daily as needed. (Patient not taking: Reported on 02/21/2022) 01/27/2022: Changed to  methocarbamol  ? empagliflozin (JARDIANCE) 10 MG TABS tablet Take 1 tablet (10 mg total) by mouth daily. For diabetes. (Patient taking differently: Take 10 mg by mouth every morning. For diabetes.)   ? fluticasone (FLONASE) 50 MCG/ACT nasal spray Place 2 sprays into both nostrils daily as needed for allergies or rhinitis.   ? fluticasone furoate-vilanterol (BREO ELLIPTA) 200-25 MCG/INH AEPB Inhale 1 puff into the lungs daily. (Patient taking differently: Inhale 1 puff into the lungs every morning.)   ? gabapentin (NEURONTIN) 300 MG capsule Take 2 capsules (600 mg total) by mouth 2 (two) times daily. For pain.   ? glipiZIDE (GLUCOTROL) 10 MG tablet Take 1 tablet (10 mg total) by mouth 2 (two) times daily before a meal. For diabetes.   ? hydrOXYzine (ATARAX) 10 MG tablet Take 1-2 tablets (10-20 mg total) by mouth 2 (two) times daily as needed. For anxiety.   ? insulin degludec (TRESIBA) 100 UNIT/ML FlexTouch Pen Inject 35 Units into the skin daily. For diabetes. (Patient taking differently: Inject 35 Units into the skin every morning. For diabetes.)   ? Insulin Pen Needle (UNIFINE PENTIPS) 31G X 6 MM MISC USE NIGHTLY WITH INSULIN   ? Ipratropium-Albuterol (COMBIVENT RESPIMAT) 20-100 MCG/ACT AERS respimat INHALE ONE PUFF INTO THE LUNGS EVERY FOUR HOURS AS NEEDED FOR WHEEZING (Patient taking differently: Inhale 1 puff into the lungs daily as needed for wheezing or shortness of breath.)   ? Lancets (ONETOUCH DELICA PLUS [id number]) MISC USE TO CHECK BLOOD SUGAR UP TO FOUR TIMES DAILY   ?  lansoprazole (PREVACID) 30 MG capsule Take 1 capsule (30 mg total) by mouth 2 (two) times daily before a meal. For heartburn.   ? metFORMIN (GLUCOPHAGE-XR) 500 MG 24 hr tablet Take 2 tablets (1,000 mg total) by mouth 2 (two) times daily with a meal. For diabetes.   ? methocarbamol (ROBAXIN) 500 MG tablet Take 500 mg by mouth at bedtime.   ? MOVANTIK 25 MG TABS tablet Take 25 mg by mouth every morning.   ? NUCYNTA 75 MG tablet Take 75 mg  by mouth 4 (four) times daily as needed (pain). 01/27/2022: Pt pt MD increased to 100 mg tid as of 02/06/22  ? ONETOUCH VERIO test strip USE TO CHECK BLOOD SUGAR UP TO FOUR TIMES DAILY   ? OVER THE COUNTER MEDICATION Take 2 tablets by mouth every morning. Osteo Biflex triple strength with turmeric   ? OVER THE COUNTER MEDICATION Take 1 tablet by mouth every morning. Neuriva   ? Oxycodone HCl 10 MG TABS Take 10 mg by mouth every 6 (six) hours as needed.   ? rivaroxaban (XARELTO) 20 MG TABS tablet Take 20 mg by mouth daily with supper.   ? tadalafil (CIALIS) 20 MG tablet Take 20 mg by mouth daily as needed for erectile dysfunction.   ? tamsulosin (FLOMAX) 0.4 MG CAPS capsule TAKE ONE CAPSULE BY MOUTH DAILY (Patient taking differently: 0.4 mg every morning.)   ? Testosterone 40.5 MG/2.5GM (1.62%) GEL Place 2.5 g onto the skin every morning. 2 pumps - 2.5 g   ? zolpidem (AMBIEN) 10 MG tablet TAKE ONE TABLET (10MG TOTAL) BY MOUTH ATBEDTIME AS NEEDED FOR SLEEP (Patient taking differently: Take 10 mg by mouth at bedtime.)   ? ?No facility-administered encounter medications on file as of 02/25/2022.  ? ?NISHAAN STANKE was contacted to remind of upcoming telephone visit with Charlene Brooke on 02/28/2022 at 11:00. Patient was reminded to have any blood glucose and blood pressure readings available for review at appointment.  ? ?Message was left reminding patient of appointment. ? ?Star Rating Drugs: ?Medication:  Last Fill: Day Supply ?Glipizide 10 mg 01/04/2022 90 ?Metformin 500 mg 01/03/2022 90  ?Atorvastatin 40 mg 01/25/2022 90 ?Jardiance 10 mg 11/16/2021 90 ?Fill dates verified with [facility] ? ?Charlene Brooke, CPP notified ? ?Marijean Niemann, RMA ?Clinical Pharmacy Assistant ?[phone] ? ? ? ? ?

## 2022-02-26 DIAGNOSIS — Z20822 Contact with and (suspected) exposure to covid-19: Secondary | ICD-10-CM | POA: Diagnosis not present

## 2022-02-27 DIAGNOSIS — G8929 Other chronic pain: Secondary | ICD-10-CM | POA: Diagnosis not present

## 2022-02-27 DIAGNOSIS — M542 Cervicalgia: Secondary | ICD-10-CM | POA: Diagnosis not present

## 2022-02-27 DIAGNOSIS — M25562 Pain in left knee: Secondary | ICD-10-CM | POA: Diagnosis not present

## 2022-02-27 DIAGNOSIS — Z79891 Long term (current) use of opiate analgesic: Secondary | ICD-10-CM | POA: Diagnosis not present

## 2022-02-27 DIAGNOSIS — M545 Low back pain, unspecified: Secondary | ICD-10-CM | POA: Diagnosis not present

## 2022-02-27 DIAGNOSIS — M25561 Pain in right knee: Secondary | ICD-10-CM | POA: Diagnosis not present

## 2022-02-28 ENCOUNTER — Telehealth: Payer: Self-pay | Admitting: Pharmacist

## 2022-02-28 ENCOUNTER — Telehealth: Payer: Medicare Other

## 2022-02-28 NOTE — Progress Notes (Deleted)
? ?Chronic Care Management ?Pharmacy Note ? ?02/28/2022 ?Name:  Joseph [name] MRN:  [id number] DOB:  13-Apr-1971 ? ?Summary: ?*** ? ?Recommendations/Changes made from today's visit: ?*** ? ?Plan: ?*** ? ? ?Subjective: ?Joseph [name] is an 51 y.o. year old male who is a primary patient of Pleas Koch, NP.  The CCM team was consulted for assistance with disease management and care coordination needs.   ? ?Engaged with patient by telephone for follow up visit in response to provider referral for pharmacy case management and/or care coordination services.  ? ?Consent to Services:  ?The patient was given information about Chronic Care Management services, agreed to services, and gave verbal consent prior to initiation of services.  Please see initial visit note for detailed documentation.  ? ?Patient Care Team: ?Pleas Koch, NP as PCP - General (Internal Medicine) ?Debbora Dus, [facility] as Pharmacist (Pharmacist) ? ? ?S/p R menisectomy 12/20/21, developed sepsis, PICC placed 01/15/22. DVT 01/04/22. ?Out of work since Oct 2022, mostly in wheelchair now. ? ?Recent office visits: ?02/21/22 NP Allie Bossier OV: f/u anxiety; Start hydroxyzine 10-20 mg BID prn ? ?10/23/22 NP Allie Bossier OV: chronic f/u; ? ?Recent consult visits: ?02/13/22 Dr West Bali (ID): f/u osteomyelitis. OPAT: Cefepime. D/c daptomycyin. ? ?01/22/22 Dr Stanford Breed (Vascular/Vein): initial visit for acute DVT. Continue Xarelto for 3 months total. Hold Xarelto for interventions. ? ?12/14/21 NP Valera Castle (Urology): f/u hypogonadism. Refilled Androgel, Flomax, Cialis. ? ?Hospital visits: ?Medication Reconciliation was completed by comparing discharge summary, patient?s EMR and Pharmacy list, and upon discussion with patient. ? ?Admitted to the hospital on 01/25/22 due to Septic arthritis. Discharge date was 01/30/22. Discharged from [facility].   ? ?New?Medications Started at [facility] Discharge:?? ?-started Cefepime and Daptomycin due to resistant  infection ? ?Medications that remain the same after Hospital Discharge:??  ?-All other medications will remain the same.   ? ?Admission 01/02/22 - R knee I&D. ? ?Objective: ? ?Lab Results  ?Component Value Date  ? CREATININE 0.64 01/30/2022  ? BUN 5 (L) 01/30/2022  ? GFR 84.95 05/03/2020  ? EGFR 99 01/11/2022  ? GFRNONAA >60 01/30/2022  ? GFRAA >60 07/13/2018  ? NA 136 01/30/2022  ? K 3.8 01/30/2022  ? CALCIUM 8.6 (L) 01/30/2022  ? CO2 28 01/30/2022  ? GLUCOSE 194 (H) 01/30/2022  ? ? ?Lab Results  ?Component Value Date/Time  ? HGBA1C 7.5 (H) 01/26/2022 03:58 AM  ? HGBA1C 8.9 (A) 10/23/2021 11:42 AM  ? HGBA1C 8.3 (A) 02/23/2021 08:23 AM  ? HGBA1C 10.8 (H) 08/23/2019 08:57 AM  ? GFR 84.95 05/03/2020 02:54 PM  ? GFR 78.56 03/08/2019 10:03 AM  ? MICROALBUR <0.7 05/03/2020 02:49 PM  ? MICROALBUR <0.7 05/21/2019 09:13 AM  ?  ?Last diabetic Eye exam:  ?Lab Results  ?Component Value Date/Time  ? HMDIABEYEEXA No Retinopathy 11/03/2017 12:00 AM  ?  ?Last diabetic Foot exam: No results found for: HMDIABFOOTEX  ? ?Lab Results  ?Component Value Date  ? CHOL 135 10/23/2021  ? HDL 34 (L) 10/23/2021  ? Fifty-Six 58 10/23/2021  ? LDLDIRECT 78.0 05/03/2020  ? TRIG 273 (H) 10/23/2021  ? CHOLHDL 4.0 10/23/2021  ? ? ? ?  Latest Ref Rng & Units 10/23/2021  ? 12:27 PM 05/03/2020  ?  2:54 PM 03/08/2019  ? 10:03 AM  ?Hepatic Function  ?Total Protein 6.0 - 8.5 g/dL 7.0   6.9   7.3    ?Albumin 3.8 - 4.9 g/dL 4.6   4.6   4.6    ?  AST 0 - 40 IU/L '23   13   21    ' ?ALT 0 - 44 IU/L 38   18   20    ?Alk Phosphatase 44 - 121 IU/L 113   98   109    ?Total Bilirubin 0.0 - 1.2 mg/dL 0.6   0.6   0.5    ? ? ?Lab Results  ?Component Value Date/Time  ? TSH 2.25 05/03/2020 02:54 PM  ? TSH 1.700 08/02/2016 09:47 AM  ? ? ? ?  Latest Ref Rng & Units 01/30/2022  ?  4:03 AM 01/29/2022  ?  3:16 AM 01/28/2022  ?  3:19 AM  ?CBC  ?WBC 4.0 - 10.5 K/uL 10.5   8.9   9.0    ?Hemoglobin 13.0 - 17.0 g/dL 11.4   11.2   10.8    ?Hematocrit 39.0 - 52.0 % 33.8   33.8   32.6    ?Platelets  150 - 400 K/uL 291   274   275    ? ? ?No results found for: VD25OH ? ?Clinical ASCVD: No  ?The 10-year ASCVD risk score (Arnett DK, et al., 2019) is: 12.6% ?  Values used to calculate the score: ?    Age: 73 years ?    Sex: Male ?    Is Non-Hispanic African American: No ?    Diabetic: Yes ?    Tobacco smoker: Yes ?    Systolic Blood Pressure: 867 mmHg ?    Is BP treated: No ?    HDL Cholesterol: 34 mg/dL ?    Total Cholesterol: 135 mg/dL   ? ? ?  02/21/2022  ?  8:10 AM 01/11/2022  ?  2:17 PM 02/23/2021  ?  8:06 AM  ?Depression screen PHQ 2/9  ?Decreased Interest 1 0 0  ?Down, Depressed, Hopeless 3 0 0  ?PHQ - 2 Score 4 0 0  ?Altered sleeping 3  0  ?Tired, decreased energy 3  0  ?Change in appetite 3  0  ?Feeling bad or failure about yourself  0  0  ?Trouble concentrating 3  0  ?Moving slowly or fidgety/restless 0  0  ?Suicidal thoughts 0  0  ?PHQ-9 Score 16  0  ?Difficult doing work/chores Very difficult  Not difficult at all  ?  ?Social History  ? ?Tobacco Use  ?Smoking Status Every Day  ? Packs/day: 1.25  ? Years: 26.00  ? Pack years: 32.50  ? Types: Cigarettes  ? Start date: 12/17/1993  ?Smokeless Tobacco Never  ?Tobacco Comments  ? 1 ppd 01/04/2020  ? ?BP Readings from Last 3 Encounters:  ?02/21/22 124/78  ?02/13/22 124/78  ?01/30/22 135/84  ? ?Pulse Readings from Last 3 Encounters:  ?02/21/22 77  ?02/13/22 (!) 111  ?01/30/22 88  ? ?Wt Readings from Last 3 Encounters:  ?02/21/22 231 lb (104.8 kg)  ?01/29/22 233 lb 14.5 oz (106.1 kg)  ?01/22/22 233 lb 12.8 oz (106.1 kg)  ? ?BMI Readings from Last 3 Encounters:  ?02/21/22 33.15 kg/m?  ?01/29/22 33.56 kg/m?  ?01/22/22 33.55 kg/m?  ? ? ?Assessment/Interventions: Review of patient past medical history, allergies, medications, health status, including review of consultants reports, laboratory and other test data, was performed as part of comprehensive evaluation and provision of chronic care management services.  ? ?SDOH:  (Social Determinants of Health) assessments and  interventions performed: {yes/no:20286} ? ?SDOH Screenings  ? ?Alcohol Screen: Not on file  ?Depression (PHQ2-9): Medium Risk  ? PHQ-2 Score: 16  ?  Financial Resource Strain: Not on file  ?Food Insecurity: Not on file  ?Housing: Not on file  ?Physical Activity: Not on file  ?Social Connections: Not on file  ?Stress: Not on file  ?Tobacco Use: High Risk  ? Smoking Tobacco Use: Every Day  ? Smokeless Tobacco Use: Never  ? Passive Exposure: Not on file  ?Transportation Needs: Not on file  ? ? ?[place] ? ?Allergies  ?Allergen Reactions  ? Azithromycin Hives  ?  01/27/22 - pt states that he had a Zpak a couple years ago with no reaction  ? Clarithromycin Hives  ? Erythromycin Hives  ? Keflex [Cephalexin] Nausea Only  ? Metformin Nausea And Vomiting  ? Symbicort [Budesonide-Formoterol Fumarate] Other (See Comments)  ?  States that 3 doses were used and breathing became worse  ? ? ?Medications Reviewed Today   ? ? Reviewed by Pleas Koch, NP (Nurse Practitioner) on 02/21/22 at [phone]  Med List Status: <None>  ? ?Medication Order Taking? Sig Documenting Provider Last Dose Status Informant  ?albuterol (PROVENTIL) (2.5 MG/3ML) 0.083% nebulizer solution [id number] Yes USE 3 ML(1 VIAL) VIA NEBULIZER FOUR TIMES DAILY AS NEEDED FOR SHORTNESS OF BREATH  ?Patient taking differently: Take 2.5 mg by nebulization 2 (two) times daily.  ? Tanda Rockers, MD Taking Active Self, Pharmacy Records  ?blood glucose meter kit and supplies KIT [id number] Yes Dispense based on patient and insurance preference. Use up to four times daily as directed. (FOR ICD-9 250.00, 250.01). Pleas Koch, NP Taking Active Other  ?buPROPion (WELLBUTRIN SR) 150 MG 12 hr tablet [id number] Yes Take 1 tablet (150 mg total) by mouth 2 (two) times daily. For anxiety and depression. Pleas Koch, NP Taking Active Self, Pharmacy Records  ?ceFEPime (MAXIPIME) IVPB [id number] Yes Inject 2 g into the vein every 8 (eight) hours. Indication:  R-knee  PJI ?First Dose: Yes ?Last Day of Therapy:  03/09/22 ?Labs - Once weekly:  CBC/D and BMP, ?Labs - Every other week:  ESR and CRP ?Method of administration: IV Push ?Method of administration may be changed at the discret

## 2022-02-28 NOTE — Telephone Encounter (Signed)
?  Chronic Care Management  ? ?Outreach Note ? ?02/28/2022 ?Name: CARLE FENECH MRN: [id number] DOB: 12/21/1970 ? ?Referred by: Doreene Nest, NP ? ?Patient had a phone appointment scheduled with clinical pharmacist today. ? ?An unsuccessful telephone outreach was attempted today. The patient was referred to the pharmacist for assistance with medications, care management and care coordination.  ? ?Patient will NOT be penalized in any way for missing a CCM appointment. The no-show fee does not apply. ? ?If possible, a message was left to return call to: [phone] or to [facility]. ? ?Al Corpus, PharmD, BCACP ?Clinical Pharmacist ?[facility] ?[phone] ? ? ?

## 2022-03-01 ENCOUNTER — Emergency Department (HOSPITAL_COMMUNITY): Payer: Medicare Other

## 2022-03-01 ENCOUNTER — Encounter (HOSPITAL_COMMUNITY): Payer: Self-pay | Admitting: Emergency Medicine

## 2022-03-01 ENCOUNTER — Other Ambulatory Visit: Payer: Self-pay

## 2022-03-01 ENCOUNTER — Emergency Department (HOSPITAL_COMMUNITY)
Admission: EM | Admit: 2022-03-01 | Discharge: 2022-03-01 | Disposition: A | Discharge status: Home / Self Care | Payer: Medicare Other | Attending: Emergency Medicine | Admitting: Emergency Medicine

## 2022-03-01 DIAGNOSIS — S8991XA Unspecified injury of right lower leg, initial encounter: Secondary | ICD-10-CM | POA: Diagnosis present

## 2022-03-01 DIAGNOSIS — X501XXA Overexertion from prolonged static or awkward postures, initial encounter: Secondary | ICD-10-CM | POA: Insufficient documentation

## 2022-03-01 DIAGNOSIS — W19XXXA Unspecified fall, initial encounter: Secondary | ICD-10-CM | POA: Diagnosis not present

## 2022-03-01 DIAGNOSIS — D72829 Elevated white blood cell count, unspecified: Secondary | ICD-10-CM | POA: Diagnosis not present

## 2022-03-01 DIAGNOSIS — Z794 Long term (current) use of insulin: Secondary | ICD-10-CM | POA: Insufficient documentation

## 2022-03-01 DIAGNOSIS — Z7901 Long term (current) use of anticoagulants: Secondary | ICD-10-CM | POA: Insufficient documentation

## 2022-03-01 DIAGNOSIS — T148XXA Other injury of unspecified body region, initial encounter: Secondary | ICD-10-CM | POA: Diagnosis not present

## 2022-03-01 DIAGNOSIS — S76111A Strain of right quadriceps muscle, fascia and tendon, initial encounter: Secondary | ICD-10-CM | POA: Insufficient documentation

## 2022-03-01 DIAGNOSIS — Z043 Encounter for examination and observation following other accident: Secondary | ICD-10-CM | POA: Diagnosis not present

## 2022-03-01 LAB — CBC WITH DIFFERENTIAL/PLATELET
Abs Immature Granulocytes: 0.05 10*3/uL (ref 0.00–0.07)
Basophils Absolute: 0.2 10*3/uL — ABNORMAL HIGH (ref 0.0–0.1)
Basophils Relative: 1 %
Eosinophils Absolute: 0.7 10*3/uL — ABNORMAL HIGH (ref 0.0–0.5)
Eosinophils Relative: 5 %
HCT: 42.5 % (ref 39.0–52.0)
Hemoglobin: 13.5 g/dL (ref 13.0–17.0)
Immature Granulocytes: 0 %
Lymphocytes Relative: 23 %
Lymphs Abs: 3.5 10*3/uL (ref 0.7–4.0)
MCH: 27.8 pg (ref 26.0–34.0)
MCHC: 31.8 g/dL (ref 30.0–36.0)
MCV: 87.4 fL (ref 80.0–100.0)
Monocytes Absolute: 1.4 10*3/uL — ABNORMAL HIGH (ref 0.1–1.0)
Monocytes Relative: 9 %
Neutro Abs: 9.3 10*3/uL — ABNORMAL HIGH (ref 1.7–7.7)
Neutrophils Relative %: 62 %
Platelets: 280 10*3/uL (ref 150–400)
RBC: 4.86 MIL/uL (ref 4.22–5.81)
RDW: 15.7 % — ABNORMAL HIGH (ref 11.5–15.5)
WBC: 15.1 10*3/uL — ABNORMAL HIGH (ref 4.0–10.5)
nRBC: 0 % (ref 0.0–0.2)

## 2022-03-01 LAB — BASIC METABOLIC PANEL
Anion gap: 12 (ref 5–15)
BUN: 5 mg/dL — ABNORMAL LOW (ref 6–20)
CO2: 23 mmol/L (ref 22–32)
Calcium: 9.4 mg/dL (ref 8.9–10.3)
Chloride: 100 mmol/L (ref 98–111)
Creatinine, Ser: 0.74 mg/dL (ref 0.61–1.24)
GFR, Estimated: >60 mL/min (ref >60)
Glucose, Bld: 157 mg/dL — ABNORMAL HIGH (ref 70–99)
Potassium: 3.7 mmol/L (ref 3.5–5.1)
Sodium: 135 mmol/L (ref 135–145)

## 2022-03-01 MED ORDER — CYCLOBENZAPRINE HCL 10 MG PO TABS
5.0000 mg | ORAL_TABLET | Freq: Once | ORAL | Status: AC
Start: 2022-03-01 — End: 2022-03-01
  Administered 2022-03-01: 5 mg via ORAL
  Filled 2022-03-01: qty 1

## 2022-03-01 MED ORDER — HYDROMORPHONE HCL 1 MG/ML IJ SOLN
1.0000 mg | Freq: Once | INTRAMUSCULAR | Status: AC
  Administered 2022-03-01: 1 mg via INTRAVENOUS
  Filled 2022-03-01: qty 1

## 2022-03-01 NOTE — ED Triage Notes (Signed)
?  Patient BIB EMS for R knee pain.  Patient had R meniscus surgery on 01/25/22, was ambulating with crutches and lost his balance causing him to catch himself with his R knee and twist it.  Patient says pain was immediate and shot up to his R buttock. VSS.  A&O x4.  ?

## 2022-03-01 NOTE — ED Provider Notes (Signed)
?[facility] ?Provider Note ? ? ?CSN: [id number] ?Arrival date & time: 03/01/22  2021 ? ?  ? ?History ? ?Chief Complaint  ?Patient presents with  ? Knee Pain  ? ? ?Joseph [name] is a 51 y.o. male hx of pyogenic arthritis, calf abscess here for evaluation of right thigh and knee pain after twisting injury.  States he was walking using his crutches.  He slipped and almost fell subsequently twisting his leg.  He has had severe pain since.  He is currently on IV antibiotics.  He denies any redness, warmth, swelling to his knee.  He feels significantly improved after his last admission.  Pain primarily located to posterior right thigh and knee.  He denies any pain to calf.  He is on chronic pain medicine at home.  No fever, emesis, back pain, bowel or bladder incontinence, saddle paresthesia, numbness or weakness.  States he has difficulty with full flexion at knee since prior surgery. ? ?HPI ? ?  ? ?Home Medications ?Prior to Admission medications   ?Medication Sig Start Date End Date Taking? Authorizing Provider  ?albuterol (PROVENTIL) (2.5 MG/3ML) 0.083% nebulizer solution USE 3 ML(1 VIAL) VIA NEBULIZER FOUR TIMES DAILY AS NEEDED FOR SHORTNESS OF BREATH ?Patient taking differently: Take 2.5 mg by nebulization 2 (two) times daily. 12/07/21   Tanda Rockers, MD  ?blood glucose meter kit and supplies KIT Dispense based on patient and insurance preference. Use up to four times daily as directed. (FOR ICD-9 250.00, 250.01). 06/28/19   Pleas Koch, NP  ?buPROPion [facility] SR) 150 MG 12 hr tablet Take 1 tablet (150 mg total) by mouth 2 (two) times daily. For anxiety and depression. 10/23/21   Pleas Koch, NP  ?ceFEPime (MAXIPIME) IVPB Inject 2 g into the vein every 8 (eight) hours. Indication:  R-knee PJI ?First Dose: Yes ?Last Day of Therapy:  03/09/22 ?Labs - Once weekly:  CBC/D and BMP, ?Labs - Every other week:  ESR and CRP ?Method of administration: IV Push ?Method of  administration may be changed at the discretion of home infusion pharmacist based upon assessment of the patient and/or caregiver's ability to self-administer the medication ordered. 01/30/22 03/10/22  Terrilee Croak, MD  ?cetirizine (ZYRTEC) 10 MG tablet Take 1 tablet by mouth at bedtime for allergies. ?Patient taking differently: Take 10 mg by mouth every morning. For allergies 12/24/21   Pleas Koch, NP  ?citalopram (CELEXA) 40 MG tablet Take 1 tablet (40 mg total) by mouth daily. For anxiety and depression. ?Patient taking differently: Take 40 mg by mouth every morning. For anxiety and depression. 10/23/21   Pleas Koch, NP  ?Continuous Blood Gluc Sensor (FREESTYLE LIBRE 3 SENSOR) MISC 1 Units by Does not apply route 4 (four) times daily. Place 1 sensor on the skin every 14 days. Use to check glucose continuously 10/23/21   Pleas Koch, NP  ?cyclobenzaprine (FLEXERIL) 10 MG tablet Take 10 mg by mouth 2 (two) times daily as needed. ?Patient not taking: Reported on 02/21/2022    [provider]  ?empagliflozin (JARDIANCE) 10 MG TABS tablet Take 1 tablet (10 mg total) by mouth daily. For diabetes. ?Patient taking differently: Take 10 mg by mouth every morning. For diabetes. 10/23/21   Pleas Koch, NP  ?fluticasone (FLONASE) 50 MCG/ACT nasal spray Place 2 sprays into both nostrils daily as needed for allergies or rhinitis. 10/23/21   Pleas Koch, NP  ?fluticasone furoate-vilanterol (BREO ELLIPTA) 200-25 MCG/INH AEPB  Inhale 1 puff into the lungs daily. ?Patient taking differently: Inhale 1 puff into the lungs every morning. 06/13/21   Tanda Rockers, MD  ?gabapentin (NEURONTIN) 300 MG capsule Take 2 capsules (600 mg total) by mouth 2 (two) times daily. For pain. 12/24/21   Pleas Koch, NP  ?glipiZIDE (GLUCOTROL) 10 MG tablet Take 1 tablet (10 mg total) by mouth 2 (two) times daily before a meal. For diabetes. 01/04/22   Pleas Koch, NP  ?hydrOXYzine (ATARAX) 10 MG  tablet Take 1-2 tablets (10-20 mg total) by mouth 2 (two) times daily as needed. For anxiety. 02/21/22   Pleas Koch, NP  ?insulin degludec (TRESIBA) 100 UNIT/ML FlexTouch Pen Inject 35 Units into the skin daily. For diabetes. ?Patient taking differently: Inject 35 Units into the skin every morning. For diabetes. 10/23/21   Pleas Koch, NP  ?Insulin Pen Needle (UNIFINE PENTIPS) 31G X 6 MM MISC USE NIGHTLY WITH INSULIN 06/26/21   Pleas Koch, NP  ?Ipratropium-Albuterol (COMBIVENT RESPIMAT) 20-100 MCG/ACT AERS respimat INHALE ONE PUFF INTO THE LUNGS EVERY FOUR HOURS AS NEEDED FOR WHEEZING ?Patient taking differently: Inhale 1 puff into the lungs daily as needed for wheezing or shortness of breath. 09/17/21   Tanda Rockers, MD  ?Lancets [facility] DELICA PLUS [id number]) MISC USE TO CHECK BLOOD SUGAR UP TO FOUR TIMES DAILY 09/14/19   Pleas Koch, NP  ?lansoprazole (PREVACID) 30 MG capsule Take 1 capsule (30 mg total) by mouth 2 (two) times daily before a meal. For heartburn. 11/06/21   Pleas Koch, NP  ?metFORMIN (GLUCOPHAGE-XR) 500 MG 24 hr tablet Take 2 tablets (1,000 mg total) by mouth 2 (two) times daily with a meal. For diabetes. 01/03/22   Michela Pitcher, NP  ?methocarbamol (ROBAXIN) 500 MG tablet Take 500 mg by mouth at bedtime. 01/21/22   [provider]  ?MOVANTIK 25 MG TABS tablet Take 25 mg by mouth every morning. 09/13/21   [provider]  ?NUCYNTA 75 MG tablet Take 75 mg by mouth 4 (four) times daily as needed (pain). 10/22/21   [provider]  ?Roma Schanz test strip USE TO CHECK BLOOD SUGAR UP TO FOUR TIMES DAILY 09/14/19   Pleas Koch, NP  ?OVER THE COUNTER MEDICATION Take 2 tablets by mouth every morning. Osteo Biflex triple strength with turmeric    [provider]  ?OVER THE COUNTER MEDICATION Take 1 tablet by mouth every morning. Neuriva    [provider]  ?Oxycodone HCl 10 MG TABS Take 10 mg by mouth every 6  (six) hours as needed. 02/19/22   [provider]  ?rivaroxaban (XARELTO) 20 MG TABS tablet Take 20 mg by mouth daily with supper.    [provider]  ?tadalafil (CIALIS) 20 MG tablet Take 20 mg by mouth daily as needed for erectile dysfunction. 12/14/21   [provider]  ?tamsulosin (FLOMAX) 0.4 MG CAPS capsule TAKE ONE CAPSULE BY MOUTH DAILY ?Patient taking differently: 0.4 mg every morning. 07/21/19   Pleas Koch, NP  ?Testosterone 40.5 MG/2.5GM (1.62%) GEL Place 2.5 g onto the skin every morning. 2 pumps - 2.5 g    [provider]  ?zolpidem (AMBIEN) 10 MG tablet TAKE ONE TABLET (10MG TOTAL) BY MOUTH ATBEDTIME AS NEEDED FOR SLEEP ?Patient taking differently: Take 10 mg by mouth at bedtime. 01/23/22   Pleas Koch, NP  ?   ? ?Allergies    ?Azithromycin, Clarithromycin, Erythromycin, Keflex [cephalexin], Metformin, and  Symbicort [budesonide-formoterol fumarate]   ? ?Review of Systems   ?Review of Systems  ?Constitutional: Negative.   ?HENT: Negative.    ?Respiratory: Negative.    ?Cardiovascular: Negative.   ?Gastrointestinal: Negative.   ?Genitourinary: Negative.   ?Musculoskeletal:   ?     Right posterior thigh and right knee pain  ?Neurological: Negative.   ?All other systems reviewed and are negative. ? ?Physical Exam ?Updated Vital Signs ?BP 138/88 (BP Location: Right Arm)   Pulse (!) 108   Temp 98 ?F (36.7 ?C) (Oral)   Resp 20   Ht _0  (1.778 m)   Wt 104.8 kg   SpO2 98%   BMI 33.15 kg/m?  ?Physical Exam ?Vitals and nursing note reviewed.  ?Constitutional:   ?   General: He is not in acute distress. ?   Appearance: He is well-developed. He is not ill-appearing, toxic-appearing or diaphoretic.  ?HENT:  ?   Head: Normocephalic and atraumatic.  ?Eyes:  ?   Pupils: Pupils are equal, round, and reactive to light.  ?Cardiovascular:  ?   Rate and Rhythm: Normal rate and regular rhythm.  ?   Pulses:     ?     Popliteal pulses are 2+ on the right side and 2+ on  the left side.  ?     Dorsalis pedis pulses are 2+ on the right side and 2+ on the left side.  ?     Posterior tibial pulses are 2+ on the right side and 2+ on the left side.  ?Pulmonary:  ?   Effort: Pulmonary effort i

## 2022-03-01 NOTE — Discharge Instructions (Addendum)
Take your home pain medicine. ? ?Follow-up with orthopedics on Monday as you have previously planned ? ?Return for new or worsening symptoms. ?

## 2022-03-04 DIAGNOSIS — M00861 Arthritis due to other bacteria, right knee: Secondary | ICD-10-CM | POA: Diagnosis not present

## 2022-03-04 DIAGNOSIS — T8142XA Infection following a procedure, deep incisional surgical site, initial encounter: Secondary | ICD-10-CM | POA: Diagnosis not present

## 2022-03-04 DIAGNOSIS — B9689 Other specified bacterial agents as the cause of diseases classified elsewhere: Secondary | ICD-10-CM | POA: Diagnosis not present

## 2022-03-04 DIAGNOSIS — L02415 Cutaneous abscess of right lower limb: Secondary | ICD-10-CM | POA: Diagnosis not present

## 2022-03-04 DIAGNOSIS — J449 Chronic obstructive pulmonary disease, unspecified: Secondary | ICD-10-CM | POA: Diagnosis not present

## 2022-03-04 DIAGNOSIS — M2241 Chondromalacia patellae, right knee: Secondary | ICD-10-CM | POA: Diagnosis not present

## 2022-03-04 DIAGNOSIS — I82461 Acute embolism and thrombosis of right calf muscular vein: Secondary | ICD-10-CM | POA: Diagnosis not present

## 2022-03-05 ENCOUNTER — Ambulatory Visit (INDEPENDENT_AMBULATORY_CARE_PROVIDER_SITE_OTHER): Payer: Medicare Other | Admitting: Infectious Diseases

## 2022-03-05 ENCOUNTER — Other Ambulatory Visit: Payer: Self-pay

## 2022-03-05 ENCOUNTER — Encounter: Payer: Self-pay | Admitting: Infectious Diseases

## 2022-03-05 VITALS — BP 135/77 | HR 105 | Temp 98.2°F | Ht 70.0 in | Wt 224.0 lb

## 2022-03-05 DIAGNOSIS — Z5181 Encounter for therapeutic drug level monitoring: Secondary | ICD-10-CM

## 2022-03-05 DIAGNOSIS — M009 Pyogenic arthritis, unspecified: Secondary | ICD-10-CM | POA: Diagnosis not present

## 2022-03-05 DIAGNOSIS — Z452 Encounter for adjustment and management of vascular access device: Secondary | ICD-10-CM | POA: Diagnosis not present

## 2022-03-05 DIAGNOSIS — F17200 Nicotine dependence, unspecified, uncomplicated: Secondary | ICD-10-CM

## 2022-03-05 NOTE — Progress Notes (Signed)
? ?   ? ?Patient Active Problem List  ? Diagnosis Date Noted  ? Osteomyelitis (HCC) 02/14/2022  ? Serratia marcescens infection 02/14/2022  ? Medication monitoring encounter 02/14/2022  ? Smoking 02/14/2022  ? Calf abscess   ? Septic arthritis of knee (HCC) 01/25/2022  ? Septic arthritis of knee, right (HCC) 01/09/2022  ? DVT (deep venous thrombosis) (HCC) 01/09/2022  ? Preoperative clearance 11/20/2021  ? Post-viral cough syndrome 11/20/2021  ? COVID-19 10/31/2021  ? Cervical lymphadenitis 11/27/2020  ? Preventative health care 07/05/2020  ? Leukocytosis 05/04/2020  ? Chronic fatigue 05/03/2020  ? Medicare annual wellness visit, subsequent 06/28/2019  ? Controlled substance agreement broken 09/14/2018  ? Primary osteoarthritis of knee 07/21/2018  ? Status post unicompartmental knee replacement, left 07/21/2018  ? Medicare annual wellness visit, initial 05/20/2018  ? Effusion of knee joint (Left) 04/21/2018  ? Opiate overdose (HCC) 03/24/2018  ? Arthropathy of knee (Left) 01/12/2018  ? Opioid-induced constipation (OIC) 01/12/2018  ? Tinea corporis 01/07/2018  ? Tinea versicolor 01/07/2018  ? Pharmacologic therapy 12/08/2017  ? Problems influencing health status 12/08/2017  ? Long term prescription opiate use 12/08/2017  ? Opiate use 12/08/2017  ? Insomnia 11/20/2017  ? Disorder of skeletal system 11/05/2017  ? Chronic knee pain (Primary Area of Pain) (Bilateral) (L>R) 11/05/2017  ? Chronic wrist pain (Secondary Area of Pain) (Left) 11/05/2017  ? Chronic constipation 08/18/2017  ? Dyslipidemia 05/12/2017  ? COPD without exacerbation (HCC) 05/07/2017  ? Male hypogonadism 04/24/2017  ? Post-void dribbling 04/01/2017  ? Osteoarthritis of the knee (Left)   ? GERD (gastroesophageal reflux disease) 03/12/2017  ? Bucket handle tear of meniscus of knee (Right)   ? Anxiety and depression 10/07/2015  ? Class 1 obesity due to excess calories with body mass index (BMI) of 33.0 to 33.9 in adult 10/06/2015  ? OSA (obstructive  sleep apnea) 08/31/2014  ? Testosterone deficiency 07/01/2014  ? S/P knee surgery 04/26/2014  ? Meniscal tear of knee, sequela (Left) 04/26/2014  ? Medial meniscus, posterior horn derangement 04/22/2014  ? Intrinsic asthma 12/19/2013  ? Chronic pain syndrome 05/13/2013  ? Type 2 diabetes mellitus with hyperglycemia (HCC) 05/13/2013  ? Mediastinal abnormality 04/10/2012  ? Cigarette smoker 04/10/2012  ? ?Current Outpatient Medications on File Prior to Visit  ?Medication Sig Dispense Refill  ? albuterol (PROVENTIL) (2.5 MG/3ML) 0.083% nebulizer solution USE 3 ML(1 VIAL) VIA NEBULIZER FOUR TIMES DAILY AS NEEDED FOR SHORTNESS OF BREATH (Patient taking differently: Take 2.5 mg by nebulization 2 (two) times daily.) 360 mL 0  ? blood glucose meter kit and supplies KIT Dispense based on patient and insurance preference. Use up to four times daily as directed. (FOR ICD-9 250.00, 250.01). 1 each 0  ? buPROPion (WELLBUTRIN SR) 150 MG 12 hr tablet Take 1 tablet (150 mg total) by mouth 2 (two) times daily. For anxiety and depression. 180 tablet 3  ? ceFEPime (MAXIPIME) IVPB Inject 2 g into the vein every 8 (eight) hours. Indication:  R-knee PJI ?First Dose: Yes ?Last Day of Therapy:  03/09/22 ?Labs - Once weekly:  CBC/D and BMP, ?Labs - Every other week:  ESR and CRP ?Method of administration: IV Push ?Method of administration may be changed at the discretion of home infusion pharmacist based upon assessment of the patient and/or caregiver's ability to self-administer the medication ordered. 120 Units 0  ? cetirizine (ZYRTEC) 10 MG tablet Take 1 tablet by mouth at bedtime for allergies. (Patient taking differently: Take 10 mg by mouth every   morning. For allergies) 90 tablet 2  ? citalopram (CELEXA) 40 MG tablet Take 1 tablet (40 mg total) by mouth daily. For anxiety and depression. (Patient taking differently: Take 40 mg by mouth every morning. For anxiety and depression.) 90 tablet 3  ? Continuous Blood Gluc Sensor (FREESTYLE  LIBRE 3 SENSOR) MISC 1 Units by Does not apply route 4 (four) times daily. Place 1 sensor on the skin every 14 days. Use to check glucose continuously 6 each 3  ? empagliflozin (JARDIANCE) 10 MG TABS tablet Take 1 tablet (10 mg total) by mouth daily. For diabetes. (Patient taking differently: Take 10 mg by mouth every morning. For diabetes.) 90 tablet 1  ? fluticasone (FLONASE) 50 MCG/ACT nasal spray Place 2 sprays into both nostrils daily as needed for allergies or rhinitis. 48 g 3  ? fluticasone furoate-vilanterol (BREO ELLIPTA) 200-25 MCG/INH AEPB Inhale 1 puff into the lungs daily. (Patient taking differently: Inhale 1 puff into the lungs every morning.) 90 each 3  ? gabapentin (NEURONTIN) 300 MG capsule Take 2 capsules (600 mg total) by mouth 2 (two) times daily. For pain. 360 capsule 2  ? glipiZIDE (GLUCOTROL) 10 MG tablet Take 1 tablet (10 mg total) by mouth 2 (two) times daily before a meal. For diabetes. 180 tablet 1  ? hydrOXYzine (ATARAX) 10 MG tablet Take 1-2 tablets (10-20 mg total) by mouth 2 (two) times daily as needed. For anxiety. 180 tablet 0  ? insulin degludec (TRESIBA) 100 UNIT/ML FlexTouch Pen Inject 35 Units into the skin daily. For diabetes. (Patient taking differently: Inject 35 Units into the skin every morning. For diabetes.) 30 mL 1  ? Insulin Pen Needle (UNIFINE PENTIPS) 31G X 6 MM MISC USE NIGHTLY WITH INSULIN 100 each 3  ? Ipratropium-Albuterol (COMBIVENT RESPIMAT) 20-100 MCG/ACT AERS respimat INHALE ONE PUFF INTO THE LUNGS EVERY FOUR HOURS AS NEEDED FOR WHEEZING (Patient taking differently: Inhale 1 puff into the lungs daily as needed for wheezing or shortness of breath.) 4 g 0  ? Lancets (ONETOUCH DELICA PLUS [id number]) MISC USE TO CHECK BLOOD SUGAR UP TO FOUR TIMES DAILY 100 each 5  ? lansoprazole (PREVACID) 30 MG capsule Take 1 capsule (30 mg total) by mouth 2 (two) times daily before a meal. For heartburn. 180 capsule 3  ? metFORMIN (GLUCOPHAGE-XR) 500 MG 24 hr tablet Take 2  tablets (1,000 mg total) by mouth 2 (two) times daily with a meal. For diabetes. 360 tablet 0  ? methocarbamol (ROBAXIN) 500 MG tablet Take 500 mg by mouth at bedtime.    ? MOVANTIK 25 MG TABS tablet Take 25 mg by mouth every morning.    ? NUCYNTA 75 MG tablet Take 75 mg by mouth 4 (four) times daily as needed (pain).    ? ONETOUCH VERIO test strip USE TO CHECK BLOOD SUGAR UP TO FOUR TIMES DAILY 100 strip 5  ? OVER THE COUNTER MEDICATION Take 2 tablets by mouth every morning. Osteo Biflex triple strength with turmeric    ? OVER THE COUNTER MEDICATION Take 1 tablet by mouth every morning. Neuriva    ? Oxycodone HCl 10 MG TABS Take 10 mg by mouth every 6 (six) hours as needed.    ? rivaroxaban (XARELTO) 20 MG TABS tablet Take 20 mg by mouth daily with supper.    ? tadalafil (CIALIS) 20 MG tablet Take 20 mg by mouth daily as needed for erectile dysfunction.    ? tamsulosin (FLOMAX) 0.4 MG CAPS capsule TAKE ONE CAPSULE BY  MOUTH DAILY (Patient taking differently: 0.4 mg every morning.) 90 capsule 1  ? Testosterone 40.5 MG/2.5GM (1.62%) GEL Place 2.5 g onto the skin every morning. 2 pumps - 2.5 g    ? zolpidem (AMBIEN) 10 MG tablet TAKE ONE TABLET (10MG TOTAL) BY MOUTH ATBEDTIME AS NEEDED FOR SLEEP (Patient taking differently: Take 10 mg by mouth at bedtime.) 90 tablet 0  ? ?No current facility-administered medications on file prior to visit.  ? ?Subjective: ?Here for HFU for recurrent rt knee septic arthritis. Getting IV cefepime through rt arm PICC without any issues. Seen in the ED 3/31 when he twisted his rt knee when he had severe pain that radiated from the knee going up to his thigh and then back. However, the pain has significantly improved now. He saw Orthopedics yesterday and was thought to have possible sprain. He is using  crutches  to walk and cannot put weight on the rt leg. He is also starting PT soon. He has been told he will eventually need a rt knee replacement. No concerns otherwise.  ? ?Review of  Systems: ?ROS all systems reviewed and negative except as stated above  ? ?Past Medical History:  ?Diagnosis Date  ? Acute encephalopathy 03/24/2018  ? Anxiety   ? Arthritis   ? oa left knee and lower back  ? Asthma

## 2022-03-07 ENCOUNTER — Other Ambulatory Visit: Payer: Self-pay | Admitting: Primary Care

## 2022-03-07 DIAGNOSIS — E119 Type 2 diabetes mellitus without complications: Secondary | ICD-10-CM

## 2022-03-11 DIAGNOSIS — T8142XA Infection following a procedure, deep incisional surgical site, initial encounter: Secondary | ICD-10-CM | POA: Diagnosis not present

## 2022-03-11 DIAGNOSIS — L02415 Cutaneous abscess of right lower limb: Secondary | ICD-10-CM | POA: Diagnosis not present

## 2022-03-11 DIAGNOSIS — I82461 Acute embolism and thrombosis of right calf muscular vein: Secondary | ICD-10-CM | POA: Diagnosis not present

## 2022-03-11 DIAGNOSIS — J449 Chronic obstructive pulmonary disease, unspecified: Secondary | ICD-10-CM | POA: Diagnosis not present

## 2022-03-11 DIAGNOSIS — M00861 Arthritis due to other bacteria, right knee: Secondary | ICD-10-CM | POA: Diagnosis not present

## 2022-03-11 DIAGNOSIS — B9689 Other specified bacterial agents as the cause of diseases classified elsewhere: Secondary | ICD-10-CM | POA: Diagnosis not present

## 2022-03-14 ENCOUNTER — Telehealth: Payer: Self-pay | Admitting: Primary Care

## 2022-03-14 DIAGNOSIS — J449 Chronic obstructive pulmonary disease, unspecified: Secondary | ICD-10-CM | POA: Diagnosis not present

## 2022-03-14 DIAGNOSIS — T8142XA Infection following a procedure, deep incisional surgical site, initial encounter: Secondary | ICD-10-CM | POA: Diagnosis not present

## 2022-03-14 DIAGNOSIS — I82461 Acute embolism and thrombosis of right calf muscular vein: Secondary | ICD-10-CM | POA: Diagnosis not present

## 2022-03-14 DIAGNOSIS — L02415 Cutaneous abscess of right lower limb: Secondary | ICD-10-CM | POA: Diagnosis not present

## 2022-03-14 DIAGNOSIS — B9689 Other specified bacterial agents as the cause of diseases classified elsewhere: Secondary | ICD-10-CM | POA: Diagnosis not present

## 2022-03-14 DIAGNOSIS — M00861 Arthritis due to other bacteria, right knee: Secondary | ICD-10-CM | POA: Diagnosis not present

## 2022-03-14 NOTE — Telephone Encounter (Signed)
Approved.  

## 2022-03-14 NOTE — Telephone Encounter (Signed)
Called left detailed message with instructions to call office if any further questions.  ?

## 2022-03-14 NOTE — Telephone Encounter (Signed)
Home Health verbal orders ?Caller Name:Stephanie ?Agency Name: [facility] ? ?Callback number: [phone] ? ?Requesting OT/PT/Skilled nursing/Social Work/Speech: ? ?Reason:Skilled Nursing ? ?Frequency:1 wk for 5 wks ? ?Please forward to [facility] pool or providers CMA  ?

## 2022-03-15 DIAGNOSIS — T8142XA Infection following a procedure, deep incisional surgical site, initial encounter: Secondary | ICD-10-CM | POA: Diagnosis not present

## 2022-03-15 DIAGNOSIS — B9689 Other specified bacterial agents as the cause of diseases classified elsewhere: Secondary | ICD-10-CM | POA: Diagnosis not present

## 2022-03-15 DIAGNOSIS — I82461 Acute embolism and thrombosis of right calf muscular vein: Secondary | ICD-10-CM | POA: Diagnosis not present

## 2022-03-15 DIAGNOSIS — L02415 Cutaneous abscess of right lower limb: Secondary | ICD-10-CM | POA: Diagnosis not present

## 2022-03-15 DIAGNOSIS — J449 Chronic obstructive pulmonary disease, unspecified: Secondary | ICD-10-CM | POA: Diagnosis not present

## 2022-03-15 DIAGNOSIS — M00861 Arthritis due to other bacteria, right knee: Secondary | ICD-10-CM | POA: Diagnosis not present

## 2022-04-04 ENCOUNTER — Encounter: Payer: Self-pay | Admitting: Pulmonary Disease

## 2022-04-08 ENCOUNTER — Telehealth: Payer: Self-pay

## 2022-04-08 ENCOUNTER — Telehealth: Payer: Self-pay | Admitting: Primary Care

## 2022-04-08 NOTE — Telephone Encounter (Signed)
Left message for patient to call back and schedule Medicare Annual Wellness Visit (AWV) to be completed by video or phone. ? ? ? ?Last AWV: 06/28/2019 ? ? ? ?Please schedule at anytime with  ?[facility]    ? ? ? ?45 minute appointment ? ? ? ?Any questions, please contact me at [phone] ?

## 2022-04-08 NOTE — Progress Notes (Signed)
? ? ?Chronic Care Management ?Pharmacy Assistant  ? ?Name: Joseph [name]  MRN: [id number] DOB: 02/01/1971 ? ?Reason for Encounter: CCM Counsellor) ?  ?Medications: ?Outpatient Encounter Medications as of 04/08/2022  ?Medication Sig Note  ? albuterol (PROVENTIL) (2.5 MG/3ML) 0.083% nebulizer solution USE 3 ML(1 VIAL) VIA NEBULIZER FOUR TIMES DAILY AS NEEDED FOR SHORTNESS OF BREATH (Patient taking differently: Take 2.5 mg by nebulization 2 (two) times daily.)   ? blood glucose meter kit and supplies KIT Dispense based on patient and insurance preference. Use up to four times daily as directed. (FOR ICD-9 250.00, 250.01).   ? buPROPion (WELLBUTRIN SR) 150 MG 12 hr tablet Take 1 tablet (150 mg total) by mouth 2 (two) times daily. For anxiety and depression.   ? cetirizine (ZYRTEC) 10 MG tablet Take 1 tablet by mouth at bedtime for allergies. (Patient taking differently: Take 10 mg by mouth every morning. For allergies)   ? citalopram (CELEXA) 40 MG tablet Take 1 tablet (40 mg total) by mouth daily. For anxiety and depression. (Patient taking differently: Take 40 mg by mouth every morning. For anxiety and depression.)   ? Continuous Blood Gluc Sensor (FREESTYLE LIBRE 3 SENSOR) MISC 1 Units by Does not apply route 4 (four) times daily. Place 1 sensor on the skin every 14 days. Use to check glucose continuously   ? empagliflozin (JARDIANCE) 10 MG TABS tablet Take 1 tablet (10 mg total) by mouth daily. For diabetes. (Patient taking differently: Take 10 mg by mouth every morning. For diabetes.)   ? fluticasone (FLONASE) 50 MCG/ACT nasal spray Place 2 sprays into both nostrils daily as needed for allergies or rhinitis.   ? fluticasone furoate-vilanterol (BREO ELLIPTA) 200-25 MCG/INH AEPB Inhale 1 puff into the lungs daily. (Patient taking differently: Inhale 1 puff into the lungs every morning.)   ? gabapentin (NEURONTIN) 300 MG capsule Take 2 capsules (600 mg total) by mouth 2 (two) times daily. For pain.   ?  glipiZIDE (GLUCOTROL) 10 MG tablet Take 1 tablet (10 mg total) by mouth 2 (two) times daily before a meal. For diabetes.   ? hydrOXYzine (ATARAX) 10 MG tablet Take 1-2 tablets (10-20 mg total) by mouth 2 (two) times daily as needed. For anxiety.   ? insulin degludec (TRESIBA) 100 UNIT/ML FlexTouch Pen Inject 35 Units into the skin daily. For diabetes. (Patient taking differently: Inject 35 Units into the skin every morning. For diabetes.)   ? Insulin Pen Needle (UNIFINE PENTIPS) 31G X 6 MM MISC USE NIGHTLY WITH INSULIN   ? Ipratropium-Albuterol (COMBIVENT RESPIMAT) 20-100 MCG/ACT AERS respimat INHALE ONE PUFF INTO THE LUNGS EVERY FOUR HOURS AS NEEDED FOR WHEEZING (Patient taking differently: Inhale 1 puff into the lungs daily as needed for wheezing or shortness of breath.)   ? Lancets (ONETOUCH DELICA PLUS [id number]) MISC USE TO CHECK BLOOD SUGAR UP TO FOUR TIMES DAILY   ? lansoprazole (PREVACID) 30 MG capsule Take 1 capsule (30 mg total) by mouth 2 (two) times daily before a meal. For heartburn.   ? metFORMIN (GLUCOPHAGE-XR) 500 MG 24 hr tablet Take 2 tablets (1,000 mg total) by mouth 2 (two) times daily with a meal. For diabetes.   ? methocarbamol (ROBAXIN) 500 MG tablet Take 500 mg by mouth at bedtime.   ? MOVANTIK 25 MG TABS tablet Take 25 mg by mouth every morning.   ? NUCYNTA 75 MG tablet Take 75 mg by mouth 4 (four) times daily as needed (pain). 01/27/2022: Pt pt MD increased  to 100 mg tid as of 02/06/22  ? ONETOUCH VERIO test strip USE TO CHECK BLOOD SUGAR UP TO FOUR TIMES DAILY   ? OVER THE COUNTER MEDICATION Take 2 tablets by mouth every morning. Osteo Biflex triple strength with turmeric   ? OVER THE COUNTER MEDICATION Take 1 tablet by mouth every morning. Neuriva   ? Oxycodone HCl 10 MG TABS Take 10 mg by mouth every 6 (six) hours as needed.   ? rivaroxaban (XARELTO) 20 MG TABS tablet Take 20 mg by mouth daily with supper.   ? tadalafil (CIALIS) 20 MG tablet Take 20 mg by mouth daily as needed for erectile  dysfunction.   ? tamsulosin (FLOMAX) 0.4 MG CAPS capsule TAKE ONE CAPSULE BY MOUTH DAILY (Patient taking differently: 0.4 mg every morning.)   ? Testosterone 40.5 MG/2.5GM (1.62%) GEL Place 2.5 g onto the skin every morning. 2 pumps - 2.5 g   ? zolpidem (AMBIEN) 10 MG tablet TAKE ONE TABLET (10MG TOTAL) BY MOUTH ATBEDTIME AS NEEDED FOR SLEEP (Patient taking differently: Take 10 mg by mouth at bedtime.)   ? ?No facility-administered encounter medications on file as of 04/08/2022.  ? ?Joseph [name] was contacted to remind of upcoming telephone visit with Charlene Brooke on 04/11/2022 at 8:45. Patient was reminded to have any blood glucose and blood pressure readings available for review at appointment.  ? ?Message was left reminding patient of appointment. ? ?Star Rating Drugs: ?Medication:  Last Fill: Day Supply ?Glipizide 10 mg           01/04/2022      90 ?Metformin 500 mg       01/03/2022      90         ?Atorvastatin 40 mg      01/25/2022      90 ?Jardiance 10 mg         03/05/2022      90 ? ?Charlene Brooke, CPP notified ? ?Marijean Niemann, RMA ?Clinical Pharmacy Assistant ?[phone] ? ? ? ? ?

## 2022-04-11 ENCOUNTER — Ambulatory Visit: Payer: Medicare Other | Admitting: Pharmacist

## 2022-04-11 ENCOUNTER — Other Ambulatory Visit: Payer: Self-pay | Admitting: Nurse Practitioner

## 2022-04-11 DIAGNOSIS — F17210 Nicotine dependence, cigarettes, uncomplicated: Secondary | ICD-10-CM

## 2022-04-11 DIAGNOSIS — E119 Type 2 diabetes mellitus without complications: Secondary | ICD-10-CM

## 2022-04-11 DIAGNOSIS — G894 Chronic pain syndrome: Secondary | ICD-10-CM

## 2022-04-11 DIAGNOSIS — G4700 Insomnia, unspecified: Secondary | ICD-10-CM

## 2022-04-11 DIAGNOSIS — E1165 Type 2 diabetes mellitus with hyperglycemia: Secondary | ICD-10-CM

## 2022-04-11 DIAGNOSIS — J449 Chronic obstructive pulmonary disease, unspecified: Secondary | ICD-10-CM

## 2022-04-11 DIAGNOSIS — F32A Depression, unspecified: Secondary | ICD-10-CM

## 2022-04-11 DIAGNOSIS — E785 Hyperlipidemia, unspecified: Secondary | ICD-10-CM

## 2022-04-11 NOTE — Progress Notes (Signed)
? ?Chronic Care Management ?Pharmacy Note ? ?04/17/2022 ?Name:  Joseph [name] MRN:  [id number] DOB:  [birth year] ? ?Summary: CCM F/U visit ?-DM: A1c 7.5% (01/2022), pt was buying Freestyle Libre 3 at the pharmacy and he has not bought this in a while due to high cost, but since he is on insulin Medicare should cover Freestyle Libre 2 through DME ?-Pt has completed 3-monthtreatment for provoked DVT with Xarelto - removed from list ?-Pt stopped taking hydroxyzine for anxiety, it was not effective - removed from list. He reports anxiety is reasonably controlled on citalopram, bupropion right now. ? ?Recommendations/Changes made from today's visit: ?-[facility] [employer]?-Advised pt to call office if anxiety becomes an issues again ? ?Plan: ?-CShivelywill call patient 2 weeks for Libre update; 2 months for DM update ?-Pharmacist follow up televisit scheduled for 3 months ?-PCP f/u 05/24/22 ? ? ?Subjective: ?JTREMEL SETTERSis an 51y.o. year old male who is a primary patient of CPleas Koch NP.  The CCM team was consulted for assistance with disease management and care coordination needs.   ? ?Engaged with patient by telephone for follow up visit in response to provider referral for pharmacy case management and/or care coordination services.  ? ?Consent to Services:  ?The patient was given information about Chronic Care Management services, agreed to services, and gave verbal consent prior to initiation of services.  Please see initial visit note for detailed documentation.  ? ?Patient Care Team: ?CPleas Koch NP as PCP - General (Internal Medicine) ?Emmalou Hunger, LCleaster Corin [facility]as Pharmacist (Pharmacist) ? ? ?S/p R menisectomy 12/20/21, developed sepsis, PICC placed 01/15/22. DVT 01/04/22. ?Out of work since Oct 2022, mostly in wheelchair now. ? ?Recent office visits: ?02/21/22 NP KAllie BossierOV: f/u anxiety; Start hydroxyzine 10-20 mg BID prn ?  ?10/23/22 NP KAllie BossierOV:  chronic f/u; ? ?Recent consult visits: ?03/05/22 Dr MWest Bali(ID): f/u pyogenic arthritis on IV abx. EOT 03/09/22. ? ?02/13/22 Dr MWest Bali(ID): f/u osteomyelitis. OPAT: Cefepime. D/c daptomycyin. ?  ?01/22/22 Dr HStanford Breed(Vascular/Vein): initial visit for acute DVT. Continue Xarelto for 3 months total. Hold Xarelto for interventions. ?  ?12/14/21 NP SValera Castle(Urology): f/u hypogonadism. Refilled Androgel, Flomax, Cialis. ? ?Hospital visits: ?Medication Reconciliation was completed by comparing discharge summary, patient?s EMR and Pharmacy list, and upon discussion with patient. ?  ?03/01/22 ED visit ([facility]: fall - f/u ortho. ? ?Admitted to the hospital on 01/25/22 due to Septic arthritis. Discharge date was 01/30/22. [facility]   ?  ?[facility]?? ?-started Cefepime and Daptomycin due to resistant infection ?  ?Medications that remain the same after Hospital Discharge:??  ?-All other medications will remain the same.   ?  ?Admission 01/02/22 - R knee I&D. ? ? ?Objective: ? ?Lab Results  ?Component Value Date  ? CREATININE 0.74 03/01/2022  ? BUN 5 (L) 03/01/2022  ? GFR 84.95 05/03/2020  ? EGFR 99 01/11/2022  ? GFRNONAA >60 03/01/2022  ? GFRAA >60 07/13/2018  ? NA 135 03/01/2022  ? K 3.7 03/01/2022  ? CALCIUM 9.4 03/01/2022  ? CO2 23 03/01/2022  ? GLUCOSE 157 (H) 03/01/2022  ? ? ?Lab Results  ?Component Value Date/Time  ? HGBA1C 7.5 (H) 01/26/2022 03:58 AM  ? HGBA1C 8.9 (A) 10/23/2021 11:42 AM  ? HGBA1C 8.3 (A) 02/23/2021 08:23 AM  ? HGBA1C 10.8 (H) 08/23/2019 08:57 AM  ? GFR 84.95 05/03/2020  02:54 PM  ? GFR 78.56 03/08/2019 10:03 AM  ? MICROALBUR <0.7 05/03/2020 02:49 PM  ? MICROALBUR <0.7 05/21/2019 09:13 AM  ?  ?Last diabetic Eye exam:  ?Lab Results  ?Component Value Date/Time  ? HMDIABEYEEXA No Retinopathy 11/03/2017 12:00 AM  ?  ?Last diabetic Foot exam: No results found for: HMDIABFOOTEX  ? ?Lab Results  ?Component Value Date  ? CHOL 135 10/23/2021  ? HDL 34  (L) 10/23/2021  ? [place] 58 10/23/2021  ? LDLDIRECT 78.0 05/03/2020  ? TRIG 273 (H) 10/23/2021  ? CHOLHDL 4.0 10/23/2021  ? ? ? ?  Latest Ref Rng & Units 10/23/2021  ? 12:27 PM 05/03/2020  ?  2:54 PM 03/08/2019  ? 10:03 AM  ?Hepatic Function  ?Total Protein 6.0 - 8.5 g/dL 7.0   6.9   7.3    ?Albumin 3.8 - 4.9 g/dL 4.6   4.6   4.6    ?AST 0 - 40 IU/L '23   13   21    ' ?ALT 0 - 44 IU/L 38   18   20    ?Alk Phosphatase 44 - 121 IU/L 113   98   109    ?Total Bilirubin 0.0 - 1.2 mg/dL 0.6   0.6   0.5    ? ? ?Lab Results  ?Component Value Date/Time  ? TSH 2.25 05/03/2020 02:54 PM  ? TSH 1.700 08/02/2016 09:47 AM  ? ? ? ?  Latest Ref Rng & Units 03/01/2022  ? 10:15 PM 01/30/2022  ?  4:03 AM 01/29/2022  ?  3:16 AM  ?CBC  ?WBC 4.0 - 10.5 K/uL 15.1   10.5   8.9    ?Hemoglobin 13.0 - 17.0 g/dL 13.5   11.4   11.2    ?Hematocrit 39.0 - 52.0 % 42.5   33.8   33.8    ?Platelets 150 - 400 K/uL 280   291   274    ? ? ?No results found for: VD25OH ? ?Clinical ASCVD: No  ?The 10-year ASCVD risk score (Arnett DK, et al., 2019) is: 14.5% ?  Values used to calculate the score: ?    Age: 51 years ?    Sex: Male ?    Is Non-Hispanic African American: No ?    Diabetic: Yes ?    Tobacco smoker: Yes ?    Systolic Blood Pressure: 597 mmHg ?    Is BP treated: No ?    HDL Cholesterol: 34 mg/dL ?    Total Cholesterol: 135 mg/dL   ? ? ?  03/05/2022  ? 11:13 AM 02/21/2022  ?  8:10 AM 01/11/2022  ?  2:17 PM  ?Depression screen PHQ 2/9  ?Decreased Interest 0 1 0  ?Down, Depressed, Hopeless 0 3 0  ?PHQ - 2 Score 0 4 0  ?Altered sleeping  3   ?Tired, decreased energy  3   ?Change in appetite  3   ?Feeling bad or failure about yourself   0   ?Trouble concentrating  3   ?Moving slowly or fidgety/restless  0   ?Suicidal thoughts  0   ?PHQ-9 Score  16   ?Difficult doing work/chores  Very difficult   ?  ? ?Social History  ? ?Tobacco Use  ?Smoking Status Every Day  ? Packs/day: 2.00  ? Years: 26.00  ? Pack years: 52.00  ? Types: Cigarettes  ? Start date: 12/17/1993   ?Smokeless Tobacco Never  ?Tobacco Comments  ? States 2 ppd, wants  to quit but has had 3 surgeries in 2 months  ? ?BP Readings from Last 3 Encounters:  ?03/05/22 135/77  ?03/01/22 136/78  ?02/21/22 124/78  ? ?Pulse Readings from Last 3 Encounters:  ?03/05/22 (!) 105  ?03/01/22 98  ?02/21/22 77  ? ?Wt Readings from Last 3 Encounters:  ?03/05/22 224 lb (101.6 kg)  ?03/01/22 231 lb (104.8 kg)  ?02/21/22 231 lb (104.8 kg)  ? ?BMI Readings from Last 3 Encounters:  ?03/05/22 32.14 kg/m?  ?03/01/22 33.15 kg/m?  ?02/21/22 33.15 kg/m?  ? ? ?Assessment/Interventions: Review of patient past medical history, allergies, medications, health status, including review of consultants reports, laboratory and other test data, was performed as part of comprehensive evaluation and provision of chronic care management services.  ? ?SDOH:  (Social Determinants of Health) assessments and interventions performed: Yes ?SDOH Interventions   ? ?Flowsheet Row Most Recent Value  ?SDOH Interventions   ?Food Insecurity Interventions Intervention Not Indicated  ?Financial Strain Interventions Other (Comment)  Aretta Nip Libre 3 not covered, switching to FL2]  ? ?  ? ?SDOH Screenings  ? ?Alcohol Screen: Not on file  ?Depression (PHQ2-9): Low Risk   ? PHQ-2 Score: 0  ?Financial Resource Strain: Medium Risk  ? Difficulty of Paying Living Expenses: Somewhat hard  ?Food Insecurity: No Food Insecurity  ? Worried About Charity fundraiser in the Last Year: Never true  ? Ran Out of Food in the Last Year: Never true  ?Housing: Not on file  ?Physical Activity: Not on file  ?Social Connections: Not on file  ?Stress: Not on file  ?Tobacco Use: High Risk  ? Smoking Tobacco Use: Every Day  ? Smokeless Tobacco Use: Never  ? Passive Exposure: Not on file  ?Transportation Needs: Not on file  ? ? ?[place] ? ?Allergies  ?Allergen Reactions  ? Azithromycin Hives  ?  01/27/22 - pt states that he had a Zpak a couple years ago with no reaction  ? Clarithromycin  Hives  ? Erythromycin Hives  ? Keflex [Cephalexin] Nausea Only  ? Metformin Nausea And Vomiting  ? Symbicort [Budesonide-Formoterol Fumarate] Other (See Comments)  ?  States that 3 doses were used and breathing

## 2022-04-17 ENCOUNTER — Telehealth: Payer: Self-pay

## 2022-04-17 NOTE — Progress Notes (Signed)
Called patient to ask him to call [facility] in regards to his [place] 2 delivery. Patient did not answer; left a message with phone number for him to contact. ? ?Charlene Brooke, CPP notified ? ?Marijean Niemann, RMA ?Clinical Pharmacy Assistant ?[phone] ? ? ? ?

## 2022-04-17 NOTE — Patient Instructions (Signed)
Visit Information ? ?Phone number for Pharmacist: [phone] ? ? Goals Addressed   ? ?  ?  ?  ?  ? This Visit's Progress  ?  Monitor and Manage My Blood Sugar-Diabetes Type 2     ?  Timeframe:  Long-Range Goal ?Priority:  Medium ?Start Date:    04/17/22                         ?Expected End Date:   04/18/23                ? ?Follow Up Date Aug 2023 ?  ?- check blood sugar at prescribed times using [facility] ?- check blood sugar if I feel it is too high or too low ?- take the blood sugar meter to all doctor visits  ?  ?Why is this important?   ?Checking your blood sugar at home helps to keep it from getting very high or very low.  ?Writing the results in a diary or log helps the doctor know how to care for you.  ?Your blood sugar log should have the time, date and the results.  ?Also, write down the amount of insulin or other medicine that you take.  ?Other information, like what you ate, exercise done and how you were feeling, will also be helpful.   ?  ?Notes:  ?  ? ?  ? ? ?Care Plan : [place]  ?Updates made by Charlton Haws, RPH since 04/17/2022 12:00 AM  ?  ? ?Problem: Hyperlipidemia, Diabetes, COPD, Anxiety, and Tobacco use, Insomnia, chronic pain   ?Priority: High  ?  ? ?Long-Range Goal: Disease mgmt   ?Start Date: 04/17/2022  ?Expected End Date: 04/18/2023  ?This Visit's Progress: On track  ?Priority: High  ?Note:   ?Current Barriers:  ?Paying out of pocket for [employer] 3 ?Continues to smoke 2 ppd, not ready to quit ?  ?Pharmacist Clinical Goal(s):  ?Patient will adhere to plan to optimize therapeutic regimen for DM as evidenced by report of adherence to recommended medication management changes through collaboration with PharmD and provider.  ?  ?Interventions: ?1:1 collaboration with Pleas Koch, NP regarding development and update of comprehensive plan of care as evidenced by provider attestation and co-signature ?Inter-disciplinary care team collaboration (see  longitudinal plan of care) ?Comprehensive medication review performed; medication list updated in electronic medical record ?  ?Diabetes (A1c goal <7%) ?-Not ideally controlled - A1c 7.5% (01/2022) ?-Current home glucose readings: none available, he has not bought [employer] 3 in a while, he is paying out of pocket for this; he denies s/sx of hypoglycemia ?-Current medications: ?Jardiance 10 mg daily - Appropriate, Query Effective ?Glipizide 10 mg BID -Appropriate, Query Effective ?Tresiba 35 units daily -Appropriate, Query Effective ?Metformin ER 500 mg - 2 tab BID -Appropriate, Query Effective ?Freestyle Libre 3 - Appropriate, Effective, Safe, Query Accessible ?-Medications previously tried: n/a  ?-Educated on A1c and blood sugar goals; ?-Discussed CGM - since he is on insulin Medicare should cover [employer] 2; will coordinate with DME ([facility]) for [employer] 2 ?-Counseled to check feet daily and get yearly eye exams - pt ok to schedule eye exam at [facility]/12/22, will coordinate with staff for scheduling ?-Recommended to continue current medication ?  ?Hyperlipidemia: (LDL goal < 70) ?-Controlled - LDL 58 (10/2021); statin on hold while on IV daptomacyin but pt reports he has restarted this without issue since IV abx  are complete ?-Current treatment: ?Atorvastatin 40 mg daily - Appropriate, Effective, Safe, Accessible ?-Medications previously tried: n/a  ?-Educated on Cholesterol goals; Benefits of statin for ASCVD risk reduction; ?-Pt was concerned about statin drug interactions - reviewed his med list, there are no concerning interactions currently ?-Recommended to continue current medication ?  ?COPD (Goal: control symptoms and prevent exacerbations) ?-Controlled ?-Gold Grade: Gold 2 (FEV1 50-79%) ?-Current COPD Classification:  A (low sx, <2 exacerbations/yr) ?-Pulmonary function testing: 10/2015 - FEV1 73% predicted, ratio 0.84 ?-Exacerbations requiring treatment in last 6 months: 0 ?-Current  treatment  ?Breo Ellipta 200-25 mcg/act 1 puff daily - Appropriate, Effective, Safe, Accessible ?Albuterol nebulizer -Appropriate, Effective, Safe, Accessible ?Combivent Respimat PRN -Appropriate, Effective, Safe, Accessible ?-Medications previously tried: Josefine Class, Spiriva ?-Patient reports consistent use of maintenance inhaler ?-Frequency of rescue inhaler use: rare ?-Counseled on Benefits of consistent maintenance inhaler use ?-Recommended to continue current medication ?  ?Tobacco use (Goal: cessation) ?-Uncontrolled - smoking 2 ppd, 52 pack-years. Not addressed today. ?  ?Anxiety (Goal: manage symptoms) ?-Not ideally controlled - pt reports hydroxyzine has not helped at all, he is not using it anymore. He is compliant with bupropion, citalopram.  ?-Current treatment  ?Bupropion SR 150 mg BID - Appropriate, Query Effective ?Citalopram 40 mg daily - Appropriate, Query Effective ?Hydroxyzine 10-20 mg BID prn ?-Medications previously tried: alprazolam, diazepam, lorazepam ?-Recommended to continue current medication ?  ?Insomnia (Goal: 6-8 hours of sleep/night) ?-Controlled - pt feels he cannot sleep if he does not take zolpidem or use CPAP; he also feels it is pain is the reason for insomnia when he can't sleep, he report he sleeps well most of the time ?-Current treatment: ?Zolpidem 10 mg HS - Appropriate, Effective, Query Safe ?-Medications previously tried: trazodone ?-Counseled on sleep hygiene techniques (consistent sleep/wake up schedule, no electronic screens 1-2 hours before bed, etc) ?-Reviewed side effects of zolpidem including oversedation, slowed reaction times; pt denies any issues ?-Recommended to continue current medication ? ?Chronic pain (Goal: manage symptoms) ?-Reasonably controlled - pt reports he is in pain 24/7, but pain levels are manageable with current regimen ?-s/p meniscal tear, chronic knee pain following with pain mgmt, ortho, PT ?-s/p pyogenic arthritis and 6 weeks of IV abx  (ended 03/2022), hoping for TKA   ?-Current treatment  ?Nucynta 100 mg TID prn - Appropriate, Effective, Safe, Accessible ?Oxycodone 10 mg q6h PRN -Appropriate, Effective, Safe, Accessible ?Cyclobenzaprine 10 mg PRN -Appropriate, Effective, Safe, Accessible ?Gabapentin 300 mg - 2 cap BID -Appropriate, Effective, Safe, Accessible ?Methocarbamol 500 mg HS -Appropriate, Effective, Safe, Accessible ?Movantik 25 mg daily -Appropriate, Effective, Safe, Accessible ?-Medications previously tried: n/a  ?-Pt was concerned about gabapentin and risk for cognitive decline - there have been studies that suggest gabapentin is associated with cognitive decline and falls; advised pt to consider benefits/risk of medication ?-Recommended to continue current medication ?  ?DVT (Goal: prevent recurrence) ?-Controlled ?-DVT 01/04/22 after knee surgery, provoked - 3 months total per vascular (end 04/03/22) ?-Current treatment  ?Xarelto 20 mg daily - completed course. Removed from list. ?-Medications previously tried: n/a  ?-Counseled on important of physical activity ?  ?Patient Goals/Self-Care Activities ?Patient will:  ?- take medications as prescribed as evidenced by patient report and record review ?focus on medication adherence by routine ?  ?  ? ?Patient verbalizes understanding of instructions and care plan provided today and agrees to view in MyChart. Active MyChart status and patient understanding of how to access instructions and care plan via MyChart confirmed  with patient.    ?Telephone follow up appointment with pharmacy team member scheduled for: 3 months ? ?Charlene Brooke, PharmD, BCACP ?Clinical Pharmacist ?[facility] ?[phone] ?  ?

## 2022-04-23 NOTE — Telephone Encounter (Signed)
[facility] patient has elected to decline shipment of Freestyle Libre 2, so the order was cancelled. If circumstances change order will have to be resubmitted.

## 2022-05-09 ENCOUNTER — Other Ambulatory Visit: Payer: Self-pay | Admitting: Primary Care

## 2022-05-09 DIAGNOSIS — G4700 Insomnia, unspecified: Secondary | ICD-10-CM

## 2022-05-09 DIAGNOSIS — G8929 Other chronic pain: Secondary | ICD-10-CM

## 2022-05-13 ENCOUNTER — Telehealth: Payer: Self-pay | Admitting: Internal Medicine

## 2022-05-13 MED ORDER — ALBUTEROL SULFATE (2.5 MG/3ML) 0.083% IN NEBU: 2.5000 mg | INHALATION_SOLUTION | RESPIRATORY_TRACT | 3 refills | Status: DC | PRN

## 2022-05-13 NOTE — Telephone Encounter (Signed)
Next Appt With Pulmonology Sandrea Hughs, MD) 05/20/2022 at 11:30 AM

## 2022-05-13 NOTE — Telephone Encounter (Signed)
ATC patient in regards to medication refill and to also get him scheduled for OV. He was last seen in January and Dr. Sherene Sires wanted him to come back in 6 weeks. Refill will be sent in but he also needs to be scheduled for a follow up when he calls back.

## 2022-05-20 ENCOUNTER — Encounter: Payer: Self-pay | Admitting: Internal Medicine

## 2022-05-20 ENCOUNTER — Ambulatory Visit (INDEPENDENT_AMBULATORY_CARE_PROVIDER_SITE_OTHER): Payer: Self-pay | Admitting: Internal Medicine

## 2022-05-20 DIAGNOSIS — J45909 Unspecified asthma, uncomplicated: Secondary | ICD-10-CM

## 2022-05-20 DIAGNOSIS — F17210 Nicotine dependence, cigarettes, uncomplicated: Secondary | ICD-10-CM

## 2022-05-20 NOTE — Patient Instructions (Signed)
No change in medications   Please schedule a follow up visit in 12 months but call sooner if needed   Add: rec annual LDSCT

## 2022-05-20 NOTE — Progress Notes (Unsigned)
Subjective:   Patient ID: Joseph [name], male    DOB: [birth year]   MRN: [id number]    Brief patient profile:  15 yowm active smoker without significant airflow obst by PFTs 06/02/12 and 03/08/2015    History of Present Illness  In April 2013 cc  had pneumonia apparently. Went to [facility] ER and found some abnormality (nodule based on review of ER notes) on CXR that resulted in CT Angio 03/07/12 same visit. PE ruled out but found to have mediastinal nodes but the cxr nodule was not there. Apparently ER doctor said it was lymphoma and asked to see an oncologist. But primary care doctor felt that was over zealous and referred patient here. He prefers conservative mgmt to nodes if clinical suspicion for cancer is low  cc lack of energy (spent most of days yesterday in bed), esp in pollen, chronic dyspnea on exertion for 180 feet relieved by rest  But slowly progressive since 2002 for which he uses albuterol prn on average 4 times a day.  Walkt test 185 feet x 3 laps: no desaturation  He smokes at baseline; trying quit, prior intolerance (dreams) with chantix.   Only baseline mild chronic cough with associtaed wheeze that is occasional.. Does have some B symptoms like nocturnal diaphoresis (soaks pillows). Denies weight loss.   CT ANGIOGRAPHY CHEST  Comparison: 03/07/2012  An abnormal right lower paratracheal node has a short axis diameter  of 1.6 cm. An abnormal AP window lymph node has a short axis  diameter of 1.4 cm. Mild bilateral hilar and infrahilar adenopathy  noted. Scattered small axillary lymph nodes are present.  A subcarinal node has a short axis diameter of 1.5 cm.     OV 08/14/2012 Mediastinal nodes: PET scan 06/10/12 shows very low uptake in the mediastinal nodes. Suspicion for cancer is very low. Lymph nodes include 13 mm right lower paratracheal lymph node and 10 mm prevascular lymph node. ACE level 04/10/12  - is in 20s and normal  Still dyspneic: 1 flught of steps and better  with rest and with albuterol and atrovent prn. Dyspne also worse in winter and cooler weathers. Rates it as moderate. STable since last visit to slightly worse. thre is some associtaed cough. No asociated weight loss. COugh is moderate and worst early in morning and in cooler temperatues.. Denies sinus drainage. PFts show mixed picture - fvc 3.4L/69%, fev1 2.9/76%, Ratio 85 but 12% bd respose. TLC 5.7/83%. DLCO 23.7/69% rec rec #MEdiastinal node  - suspicion for cancer is low  - you will need CT chest in a year; we will schedule it later  #Smoking  - glad you are working on quitting  #Shortness of breath - this is due to asthma/copd and weight  - please start QVAR 2 puff twice daily - take sample and show technique and take prescription too  - focus on weight loss through the low glycemic diet; take diet sheet from [place]  #FOllowup  - 3 months with spirometry at followup  - flu shot and pneumovax through PMD as discussed   12/17/2013  acute ov/Joseph [name] still active smoker re: recurrent pattern of coughing x a decade intermittent / out of control since nov 2014/ can't take symbicort makes it worse / on percocet one tid at baseline  Chief Complaint  Patient presents with   Acute Visit    MR pt.  C/o mostly nonprod cough with some thick white mucous, coughing until he almost passes out.  Went to [employer] [facility] last week.    duoneb daily  Sometime skips the doses later in the day but always uses the am dose, sputum to purulent. Mostly sob with cough/ otherwise breaths ok Last neb was 4 h prior to OV   rec For Cough - use the flutter valve as much as you can plus mucinex dm 1200 mg every 12 hours as needed supplement with percocet up to 2 every 4hours For Breathing  Only use your duoneb  rescue medication . - Ok to use up to    every 4 hours if you must  Prednisone 10 mg take  4 each am x 2 days,   2 each am x 2 days,  1 each am x 2 days and stop  Pantoprazole (protonix) 40 mg   Take 30-60 min  before first meal of the day and Pepcid 20 mg one bedtime until return to [name] - this is the best way to tell whether stomach acid is contributing to your problem.  GERD  Diet . Please schedule a follow up [name] in 2 weeks, sooner if needed with all meds in hand    07/27/2014 f/u ov/Joseph [name] re: asthma / still smoking  Chief Complaint  Patient presents with   Follow-up    Pt c/o increased SOB and cough for the past month- cough is occ prod with minimal clear sputum. Pt currently taking clindamycin for staph infection. Rarely using albuterol inhaler, but is using neb approx 4 times per day.   off dulera x 1.5 week denies more sob/ cough or need for rescue on vs off rec Plan A=  dulera 200 Take 2 puffs first thing in am and then another 2 puffs about 12 hours later (or formulary alternative- call us if you find a cheaper one)  Plan B =  Backup = proaire  Plan C = Backup plan B = nebulized albuterol up to 4 hours but call if needing this much at all The key is to stop smoking completely before smoking completely stops you!    11/29/2014 f/u ov/Joseph [name] re: s/p "aecopd" still smoking  Chief Complaint  Patient presents with   Follow-up    Pt states that his SOB and cough have been worse for the past several wks. He went to ED for COPD exacerbation on 11/14/14.  He is using proair 3-4 times per day and neb about 3 times per day on average.   one month prior to flare needing proair 1-2 per day and neb at least once or twice also - using neb first thing instead of dulera  Better p pred from er then worse again w/in a week of taper off assoc with congested cough but mostly white mucus esp in ams rec Think of your respiratory medications as multiple steps you can take to control your symptoms and avoid having to go to the ER Plan A is your maintenance daily no matter what meds: dulera Take 2 puffs first thing in am and then another 2 puffs about 12 hours later.  Plan B only use after you've used  your maintenance (Plan A) medication, and only if you can't catch your breath: Proair 2 every 4 hours as needed  Plan C only use after you've used plan A and B and still can't catch your breath: nebulizer albuterol 2.5 mg up to every 4 hours  Plan D(for Doctor):  If you've used A thru C and not doing a lot better or still needing C more than  a once a day,  D = call the doctor for evaluation asap Plan E (for ER):  If still not able to catch your breath, even after using your nebulizer up to every 4 hours, go to ER  For cough > delsym 2 tsp every 12 hours is the best you can buy Prevacid 30 mg Take 30-60 min before first meal of the day  GERD diet   10/03/2015  f/u ov/Joseph [name] re: still smoking /  ? asthma vs all vcd/ gerd  Chief Complaint  Patient presents with   Follow-up    Pt states his breathing is overall doing well. He is using rescue inhaler and neb several times per day.   Wife left him in July 2016 and losing wt, dm better, breathing not since stopped dulera and never went for mct and can't afford it  No noct symptoms after hs saba/ cpap  saba use up when no access to dulera 200 but even on it he continues to perceive need for saba daily despite nl exams/ pfts rec Please see patient coordinator before you leave today  to schedule a methacholine challenge test>  POSITIVE Ok to resume dulera but stop it 24 hours before the methacholine and no albuterol w/in 6 hours  Please schedule a follow up [name] in 4 weeks, sooner if needed      COVID Nov 02 2021   12/18/2021  f/u ov/Joseph [name] re: AB maint on Breo 100 and way too much neb despite breo 200 daily  Chief Complaint  Patient presents with   Follow-up    Dx with Covid 19 Nov 01 2021. He states he has been fighting a cold and wakes up in the am with a lot of chest congestion. He has a prod cough with green/yellow sputum.    Dyspnea:  limited by R knee, surgery for R knee repair on 12/20/21 planned Cough: x sev weeks Sleeping: on side /  slt elevation  SABA use: way too much daytime use  02: none Covid status:   never vax  Rec Doxycline 100 mg twice daily x 7 days  Depomedrol 120 mg IM      05/20/2022  f/u ov/Joseph [name]/Joseph [name] re: AB maint on Breo 200  Chief Complaint  Patient presents with   Follow-up    Coughing has cleared up   Dyspnea:  Not limited by breathing from desired activities   Cough: none Sleeping: on cpap x years / Joseph [name] has seen  SABA use: hfa/neb rarely  02: none      No obvious day to day or daytime variability or assoc excess/ purulent sputum or mucus plugs or hemoptysis or cp or chest tightness, subjective wheeze or overt sinus or hb symptoms.   *** without nocturnal  or early am exacerbation  of respiratory  c/o's or need for noct saba. Also denies any obvious fluctuation of symptoms with weather or environmental changes or other aggravating or alleviating factors except as outlined above   No unusual exposure hx or h/o childhood pna/ asthma or knowledge of premature birth.  Current Allergies, Complete Past Medical History, Past Surgical History, Family History, and Social History were reviewed in [employer] record.  ROS  The following are not active complaints unless bolded Hoarseness, sore throat, dysphagia, dental problems, itching, sneezing,  nasal congestion or discharge of excess mucus or purulent secretions, ear ache,   fever, chills, sweats, unintended wt loss or wt gain, classically pleuritic or exertional cp,  orthopnea  pnd or arm/hand swelling  or leg swelling, presyncope, palpitations, abdominal pain, anorexia, nausea, vomiting, diarrhea  or change in bowel habits or change in bladder habits, change in stools or change in urine, dysuria, hematuria,  rash, arthralgias, visual complaints, headache, numbness, weakness or ataxia or problems with walking or coordination,  change in mood or  memory.        Current Meds  Medication Sig   albuterol (PROVENTIL)  (2.5 MG/3ML) 0.083% nebulizer solution Take 3 mLs (2.5 mg total) by nebulization every 4 (four) hours as needed for wheezing or shortness of breath.   atorvastatin (LIPITOR) 40 MG tablet Take 40 mg by mouth daily.   blood glucose meter kit and supplies KIT Dispense based on patient and insurance preference. Use up to four times daily as directed. (FOR ICD-9 250.00, 250.01).   buPROPion (WELLBUTRIN SR) 150 MG 12 hr tablet Take 1 tablet (150 mg total) by mouth 2 (two) times daily. For anxiety and depression.   cetirizine (ZYRTEC) 10 MG tablet Take 1 tablet by mouth at bedtime for allergies.   citalopram (CELEXA) 40 MG tablet Take 1 tablet (40 mg total) by mouth daily. For anxiety and depression.   Continuous Blood Gluc Sensor (FREESTYLE LIBRE 2 SENSOR) MISC by Does not apply route. Apply sensor every 14 days to monitor sugar continuously. Via [facility]   empagliflozin (JARDIANCE) 10 MG TABS tablet Take 1 tablet (10 mg total) by mouth daily. For diabetes.   fluticasone (FLONASE) 50 MCG/ACT nasal spray Place 2 sprays into both nostrils daily as needed for allergies or rhinitis.   fluticasone furoate-vilanterol (BREO ELLIPTA) 200-25 MCG/INH AEPB Inhale 1 puff into the lungs daily.   gabapentin (NEURONTIN) 300 MG capsule Take 2 capsules (600 mg total) by mouth 2 (two) times daily. For pain.   glipiZIDE (GLUCOTROL) 10 MG tablet Take 1 tablet (10 mg total) by mouth 2 (two) times daily before a meal. For diabetes.   hydrOXYzine (ATARAX) 10 MG tablet Take 1-2 tablets (10-20 mg total) by mouth 2 (two) times daily as needed. For anxiety.   insulin degludec (TRESIBA) 100 UNIT/ML FlexTouch Pen Inject 35 Units into the skin daily. For diabetes.   Insulin Pen Needle (UNIFINE PENTIPS) 31G X 6 MM MISC USE NIGHTLY WITH INSULIN   Ipratropium-Albuterol (COMBIVENT RESPIMAT) 20-100 MCG/ACT AERS respimat INHALE ONE PUFF INTO THE LUNGS EVERY FOUR HOURS AS NEEDED FOR WHEEZING   Lancets (ONETOUCH DELICA PLUS [id number]) MISC  USE TO CHECK BLOOD SUGAR UP TO FOUR TIMES DAILY   lansoprazole (PREVACID) 30 MG capsule Take 1 capsule (30 mg total) by mouth 2 (two) times daily before a meal. For heartburn.   metFORMIN (GLUCOPHAGE-XR) 500 MG 24 hr tablet TAKE TWO TABLETS (1000MG TOTAL) BY MOUTHTWO TIMES DAILY WITH A MEAL FOR DIABETES   methocarbamol (ROBAXIN) 500 MG tablet Take 500 mg by mouth at bedtime.   MOVANTIK 25 MG TABS tablet Take 25 mg by mouth every morning.   ONETOUCH VERIO test strip USE TO CHECK BLOOD SUGAR UP TO FOUR TIMES DAILY   OVER THE COUNTER MEDICATION Take 2 tablets by mouth every morning. Osteo Biflex triple strength with turmeric   OVER THE COUNTER MEDICATION Take 1 tablet by mouth every morning. Neuriva   Oxycodone HCl 10 MG TABS Take 10 mg by mouth every 6 (six) hours as needed.   tadalafil (CIALIS) 20 MG tablet Take 20 mg by mouth daily as needed for erectile dysfunction.   tamsulosin (FLOMAX) 0.4 MG CAPS capsule TAKE  ONE CAPSULE BY MOUTH DAILY   Tapentadol HCl 100 MG TABS Take 100 mg by mouth 3 (three) times daily as needed (pain).   Testosterone 40.5 MG/2.5GM (1.62%) GEL Place 2.5 g onto the skin every morning. 2 pumps - 2.5 g   zolpidem (AMBIEN) 10 MG tablet TAKE ONE TABLET (10MG TOTAL) BY MOUTH ATBEDTIME AS NEEDED FOR SLEEP                                     Objective:   Physical Exam  Wts                  05/20/2022      241  12/18/2021      248  08/18/2020      252  01/04/2020        251  12/31/2013      294  >    07/27/2014  286 > 11/29/2014   289> 03/08/2015  284 > 10/03/2015 266 > 10/31/2015 273 > 12/12/2015    280 > 05/23/2017  245 > 11/10/2018  245    Vital signs reviewed  05/20/2022  - Note at rest 02 sats  98% on RA   General appearance:    ***       minimal true exp  bilaterally  ***                  Assessment & Plan:

## 2022-05-21 ENCOUNTER — Encounter: Payer: Self-pay | Admitting: Internal Medicine

## 2022-05-21 ENCOUNTER — Telehealth: Payer: Self-pay | Admitting: Internal Medicine

## 2022-05-21 NOTE — Assessment & Plan Note (Signed)
without significant airflow obst by PFTs 06/02/12   Counseled re importance of smoking cessation but did not meet time criteria for separate billing    Low-dose CT lung cancer screening is recommended for patients who are 21-52 years of age with a 20+ pack-year history of smoking and who are currently smoking or quit <=15 years ago. No coughing up blood  No unintentional weight loss of > 15 pounds in the last 6 months - pt is eligible for scanning yearly for at least the next 15 years > rec referral.          Each maintenance medication was reviewed in detail including emphasizing most importantly the difference between maintenance and prns and under what circumstances the prns are to be triggered using an action plan format where appropriate.  Total time for H and P, chart review, counseling, reviewing hfa device(s) and generating customized AVS unique to this annual office visit / same day charting > 30 min

## 2022-05-21 NOTE — Assessment & Plan Note (Signed)
Active smoker - PFT's s sign copd 06/2012 - med calendar 2/13 /15 > not using 11/29/14  - 11/29/2014 p extensive coaching HFA effectiveness =    90% from a baseline of 50%  -PFTs wnl 03/08/2015 4 h p alb neb> rec MCT at least 6 h p neb > done 10/12/2015  POS Methacholine challenge > resume dulera 100 2bid - 10/31/2015    increased dulera to 200 bid (samples provided)  - 05/23/2017  BREO 200 rx initiated > much better  - Spirometry 11/10/2018  FEV1 3.0 (76%)  Ratio 84 s curvature after am BREO 200  - 01/04/2020  After extensive coaching inhaler device,  effectiveness =    90% > change back to dulera 200 2bid due to severe cough on breo and overuse of saba  - 05/17/2020 req to go back to breo 200 > improved 08/18/2020   All goals of chronic asthma control met including optimal function and elimination of symptoms with minimal need for rescue therapy.  Contingencies discussed in full including contacting this office immediately if not controlling the symptoms using the rule of two's.     F/u yearly, sooner prn with biologics next option

## 2022-05-21 NOTE — Telephone Encounter (Signed)
ATC patient. Mychart message sent with information.

## 2022-05-21 NOTE — Telephone Encounter (Signed)
Let him know I Forgot to discuss screening CT which is now rec annually for smokers and proven to reduce risk of dying from lung cancer so rec referral to Kandice Robinsons for phone discussion about the benefits of the program

## 2022-05-24 ENCOUNTER — Ambulatory Visit: Payer: Medicare Other | Admitting: Primary Care

## 2022-06-05 ENCOUNTER — Other Ambulatory Visit: Payer: Self-pay | Admitting: Primary Care

## 2022-06-05 DIAGNOSIS — E1165 Type 2 diabetes mellitus with hyperglycemia: Secondary | ICD-10-CM

## 2022-06-05 NOTE — Telephone Encounter (Signed)
Patient needs office visit for diabetes follow-up.  This was advised during his visit in March 2023.

## 2022-06-10 ENCOUNTER — Telehealth: Payer: Self-pay

## 2022-06-10 NOTE — Telephone Encounter (Signed)
Left voicemail for patient to call back regarding scheduling an appointment.

## 2022-06-10 NOTE — Progress Notes (Signed)
Chronic Care Management Pharmacy Assistant   Name: Joseph [name]  MRN: [id number] DOB: 02/23/1971  Reason for Encounter: CCM (Diabetes Disease State)   Recent office visits:  05/29/22 [name], NP Message: BP has been high: 191/101, 145/95, 153/105. Advised to make appt. Patient has yet to make one.   Recent consult visits:  05/20/22 Christinia Gully, MD (Pulmonology) Intrinsic Asthma No med changes FU 12 months  Hospital visits:  None since last CCM contact  Medications: Outpatient Encounter Medications as of 06/10/2022  Medication Sig   albuterol (PROVENTIL) (2.5 MG/3ML) 0.083% nebulizer solution Take 3 mLs (2.5 mg total) by nebulization every 4 (four) hours as needed for wheezing or shortness of breath.   atorvastatin (LIPITOR) 40 MG tablet Take 40 mg by mouth daily.   blood glucose meter kit and supplies KIT Dispense based on patient and insurance preference. Use up to four times daily as directed. (FOR ICD-9 250.00, 250.01).   buPROPion (WELLBUTRIN SR) 150 MG 12 hr tablet Take 1 tablet (150 mg total) by mouth 2 (two) times daily. For anxiety and depression.   cetirizine (ZYRTEC) 10 MG tablet Take 1 tablet by mouth at bedtime for allergies.   citalopram (CELEXA) 40 MG tablet Take 1 tablet (40 mg total) by mouth daily. For anxiety and depression.   Continuous Blood Gluc Sensor (FREESTYLE LIBRE 2 SENSOR) MISC by Does not apply route. Apply sensor every 14 days to monitor sugar continuously. Via [facility]   empagliflozin (JARDIANCE) 10 MG TABS tablet Take 1 tablet (10 mg total) by mouth daily. for diabetes. Office visit required for further refills.   fluticasone (FLONASE) 50 MCG/ACT nasal spray Place 2 sprays into both nostrils daily as needed for allergies or rhinitis.   fluticasone furoate-vilanterol (BREO ELLIPTA) 200-25 MCG/INH AEPB Inhale 1 puff into the lungs daily.   gabapentin (NEURONTIN) 300 MG capsule Take 2 capsules (600 mg total) by mouth 2 (two) times daily. For  pain.   glipiZIDE (GLUCOTROL) 10 MG tablet Take 1 tablet (10 mg total) by mouth 2 (two) times daily before a meal. For diabetes.   hydrOXYzine (ATARAX) 10 MG tablet Take 1-2 tablets (10-20 mg total) by mouth 2 (two) times daily as needed. For anxiety.   insulin degludec (TRESIBA) 100 UNIT/ML FlexTouch Pen Inject 35 Units into the skin daily. For diabetes.   Insulin Pen Needle (UNIFINE PENTIPS) 31G X 6 MM MISC USE NIGHTLY WITH INSULIN   Ipratropium-Albuterol (COMBIVENT RESPIMAT) 20-100 MCG/ACT AERS respimat INHALE ONE PUFF INTO THE LUNGS EVERY FOUR HOURS AS NEEDED FOR WHEEZING   Lancets (ONETOUCH DELICA PLUS [id number]) MISC USE TO CHECK BLOOD SUGAR UP TO FOUR TIMES DAILY   lansoprazole (PREVACID) 30 MG capsule Take 1 capsule (30 mg total) by mouth 2 (two) times daily before a meal. For heartburn.   metFORMIN (GLUCOPHAGE-XR) 500 MG 24 hr tablet TAKE TWO TABLETS (1000MG TOTAL) BY MOUTHTWO TIMES DAILY WITH A MEAL FOR DIABETES   methocarbamol (ROBAXIN) 500 MG tablet Take 500 mg by mouth at bedtime.   MOVANTIK 25 MG TABS tablet Take 25 mg by mouth every morning.   ONETOUCH VERIO test strip USE TO CHECK BLOOD SUGAR UP TO FOUR TIMES DAILY   OVER THE COUNTER MEDICATION Take 2 tablets by mouth every morning. Osteo Biflex triple strength with turmeric   OVER THE COUNTER MEDICATION Take 1 tablet by mouth every morning. Neuriva   Oxycodone HCl 10 MG TABS Take 10 mg by mouth every 6 (six) hours as needed.  tadalafil (CIALIS) 20 MG tablet Take 20 mg by mouth daily as needed for erectile dysfunction.   tamsulosin (FLOMAX) 0.4 MG CAPS capsule TAKE ONE CAPSULE BY MOUTH DAILY   Tapentadol HCl 100 MG TABS Take 100 mg by mouth 3 (three) times daily as needed (pain).   Testosterone 40.5 MG/2.5GM (1.62%) GEL Place 2.5 g onto the skin every morning. 2 pumps - 2.5 g   zolpidem (AMBIEN) 10 MG tablet TAKE ONE TABLET (10MG TOTAL) BY MOUTH ATBEDTIME AS NEEDED FOR SLEEP   No facility-administered encounter medications on  file as of 06/10/2022.   Recent Relevant Labs: Lab Results  Component Value Date/Time   HGBA1C 7.5 (H) 01/26/2022 03:58 AM   HGBA1C 8.9 (A) 10/23/2021 11:42 AM   HGBA1C 8.3 (A) 02/23/2021 08:23 AM   HGBA1C 10.8 (H) 08/23/2019 08:57 AM   MICROALBUR <0.7 05/03/2020 02:49 PM   MICROALBUR <0.7 05/21/2019 09:13 AM    Kidney Function Lab Results  Component Value Date/Time   CREATININE 0.74 03/01/2022 10:15 PM   CREATININE 0.64 01/30/2022 04:03 AM   CREATININE 0.93 01/11/2022 02:38 PM   GFR 84.95 05/03/2020 02:54 PM   GFRNONAA >60 03/01/2022 10:15 PM   GFRAA >60 07/13/2018 03:04 PM   Attempted contact with patient 3 times. Unsuccessful outreach. Will atttempt contact next month.  Current antihyperglycemic regimen:  Jardiance 10 mg daily  Glipizide 10 mg BID  Tresiba 35 units daily  Metformin ER 500 mg - 2 tab BID  Freestyle Libre 3   Adherence Review: Is the patient currently on a STATIN medication? Yes Is the patient currently on ACE/ARB medication? No Does the patient have >5 day gap between last estimated fill dates? Yes - Atorvastatin 40 mg  Care Gaps: Annual wellness visit in last year? Yes 04/08/22 Most recent A1C reading: 7.5 on 01/26/22 Most Recent BP reading: 142/90 on 05/20/22  Last eye exam / retinopathy screening: 11/03/2017 Last diabetic foot exam: Up to date  Star Rating Drugs:  Medication:  Last Fill: Day Supply Atorvastatin 40 mg 01/25/22 90 Glipizide 10 mg 04/15/22 90 Jardiance 10 mg 03/05/22 90 Metformin 500 mg 04/12/22 90 Fill date verified with [facility] appointment on 07/19/22  Charlene Brooke, CPP notified  Marijean Niemann, [place] Assistant [phone]

## 2022-06-15 ENCOUNTER — Other Ambulatory Visit: Payer: Self-pay | Admitting: Primary Care

## 2022-06-18 NOTE — Telephone Encounter (Signed)
LVM for patient to give [place] a call to schedule.

## 2022-06-18 NOTE — Telephone Encounter (Signed)
Please call to set up follow up with patent.

## 2022-07-03 ENCOUNTER — Ambulatory Visit (INDEPENDENT_AMBULATORY_CARE_PROVIDER_SITE_OTHER): Payer: Self-pay | Admitting: Primary Care

## 2022-07-03 VITALS — BP 140/90 | HR 104 | Temp 98.6°F | Ht 70.0 in | Wt 249.0 lb

## 2022-07-03 DIAGNOSIS — I10 Essential (primary) hypertension: Secondary | ICD-10-CM

## 2022-07-03 DIAGNOSIS — Z794 Long term (current) use of insulin: Secondary | ICD-10-CM

## 2022-07-03 DIAGNOSIS — E1165 Type 2 diabetes mellitus with hyperglycemia: Secondary | ICD-10-CM

## 2022-07-03 LAB — POCT GLYCOSYLATED HEMOGLOBIN (HGB A1C): Hemoglobin A1C: 7.1 % — AB (ref 4.0–5.6)

## 2022-07-03 MED ORDER — LOSARTAN POTASSIUM 50 MG PO TABS: 50.0000 mg | ORAL_TABLET | Freq: Every day | ORAL | 0 refills | Status: DC

## 2022-07-03 NOTE — Patient Instructions (Signed)
Start losartan 50 mg daily for blood pressure.  Take 1 tablet by mouth once daily.  Start checking your blood sugars as discussed.  Please schedule a follow up visit to meet back with me in 2-3 weeks for blood pressure check and rash.  It was a pleasure to see you today!  DASH Eating Plan DASH stands for Dietary Approaches to Stop Hypertension. The DASH eating plan is a healthy eating plan that has been shown to: Reduce high blood pressure (hypertension). Reduce your risk for type 2 diabetes, heart disease, and stroke. Help with weight loss. What are tips for following this plan? Reading food labels Check food labels for the amount of salt (sodium) per serving. Choose foods with less than 5 percent of the Daily Value of sodium. Generally, foods with less than 300 milligrams (mg) of sodium per serving fit into this eating plan. To find whole grains, look for the word "whole" as the first word in the ingredient list. Shopping Buy products labeled as "low-sodium" or "no salt added." Buy fresh foods. Avoid canned foods and pre-made or frozen meals. Cooking Avoid adding salt when cooking. Use salt-free seasonings or herbs instead of table salt or sea salt. Check with your health care provider or pharmacist before using salt substitutes. Do not fry foods. Cook foods using healthy methods such as baking, boiling, grilling, roasting, and broiling instead. Cook with heart-healthy oils, such as olive, canola, avocado, soybean, or sunflower oil. Meal planning  Eat a balanced diet that includes: 4 or more servings of fruits and 4 or more servings of vegetables each day. Try to fill one-half of your plate with fruits and vegetables. 6-8 servings of whole grains each day. Less than 6 oz (170 g) of lean meat, poultry, or fish each day. A 3-oz (85-g) serving of meat is about the same size as a deck of cards. One egg equals 1 oz (28 g). 2-3 servings of low-fat dairy each day. One serving is 1 cup (237  mL). 1 serving of nuts, seeds, or beans 5 times each week. 2-3 servings of heart-healthy fats. Healthy fats called omega-3 fatty acids are found in foods such as walnuts, flaxseeds, fortified milks, and eggs. These fats are also found in cold-water fish, such as sardines, salmon, and mackerel. Limit how much you eat of: Canned or prepackaged foods. Food that is high in trans fat, such as some fried foods. Food that is high in saturated fat, such as fatty meat. Desserts and other sweets, sugary drinks, and other foods with added sugar. Full-fat dairy products. Do not salt foods before eating. Do not eat more than 4 egg yolks a week. Try to eat at least 2 vegetarian meals a week. Eat more home-cooked food and less restaurant, buffet, and fast food. Lifestyle When eating at a restaurant, ask that your food be prepared with less salt or no salt, if possible. If you drink alcohol: Limit how much you use to: 0-1 drink a day for women who are not pregnant. 0-2 drinks a day for men. Be aware of how much alcohol is in your drink. In the [place] one drink equals one 12 oz bottle of beer (355 mL), one 5 oz glass of wine (148 mL), or one 1 oz glass of hard liquor (44 mL). General information Avoid eating more than 2,300 mg of salt a day. If you have hypertension, you may need to reduce your sodium intake to 1,500 mg a day. Work with your health care provider  to maintain a healthy body weight or to lose weight. Ask what an ideal weight is for you. Get at least 30 minutes of exercise that causes your heart to beat faster (aerobic exercise) most days of the week. Activities may include walking, swimming, or biking. Work with your health care provider or dietitian to adjust your eating plan to your individual calorie needs. What foods should I eat? Fruits All fresh, dried, or frozen fruit. Canned fruit in natural juice (without added sugar). Vegetables Fresh or frozen vegetables (raw, steamed, roasted,  or grilled). Low-sodium or reduced-sodium tomato and vegetable juice. Low-sodium or reduced-sodium tomato sauce and tomato paste. Low-sodium or reduced-sodium canned vegetables. Grains Whole-grain or whole-wheat bread. Whole-grain or whole-wheat pasta. Brown rice. [place]. Bulgur. Whole-grain and low-sodium cereals. Pita bread. Low-fat, low-sodium crackers. Whole-wheat flour tortillas. Meats and other proteins Skinless chicken or [place]. Ground chicken or [place]. Pork with fat trimmed off. Fish and seafood. Egg whites. Dried beans, peas, or lentils. Unsalted nuts, nut butters, and seeds. Unsalted canned beans. Lean cuts of beef with fat trimmed off. Low-sodium, lean precooked or cured meat, such as sausages or meat loaves. Dairy Low-fat (1%) or fat-free (skim) milk. Reduced-fat, low-fat, or fat-free cheeses. Nonfat, low-sodium ricotta or cottage cheese. Low-fat or nonfat yogurt. Low-fat, low-sodium cheese. Fats and oils Soft margarine without trans fats. Vegetable oil. Reduced-fat, low-fat, or light mayonnaise and salad dressings (reduced-sodium). Canola, safflower, olive, avocado, soybean, and sunflower oils. Avocado. Seasonings and condiments Herbs. Spices. Seasoning mixes without salt. Other foods Unsalted popcorn and pretzels. Fat-free sweets. The items listed above may not be a complete list of foods and beverages you can eat. Contact a dietitian for more information. What foods should I avoid? Fruits Canned fruit in a light or heavy syrup. Fried fruit. Fruit in cream or butter sauce. Vegetables Creamed or fried vegetables. Vegetables in a cheese sauce. Regular canned vegetables (not low-sodium or reduced-sodium). Regular canned tomato sauce and paste (not low-sodium or reduced-sodium). Regular tomato and vegetable juice (not low-sodium or reduced-sodium). Angie Fava. Olives. Grains Baked goods made with fat, such as croissants, muffins, or some breads. Dry pasta or rice meal  packs. Meats and other proteins Fatty cuts of meat. Ribs. Fried meat. Berniece Salines. Bologna, salami, and other precooked or cured meats, such as sausages or meat loaves. Fat from the back of a pig (fatback). Bratwurst. Salted nuts and seeds. Canned beans with added salt. Canned or smoked fish. Whole eggs or egg yolks. Chicken or [place] with skin. Dairy Whole or 2% milk, cream, and half-and-half. Whole or full-fat cream cheese. Whole-fat or sweetened yogurt. Full-fat cheese. Nondairy creamers. Whipped toppings. Processed cheese and cheese spreads. Fats and oils Butter. Stick margarine. Lard. Shortening. Ghee. Bacon fat. Tropical oils, such as coconut, palm kernel, or palm oil. Seasonings and condiments Onion salt, garlic salt, seasoned salt, table salt, and sea salt. Worcestershire sauce. Tartar sauce. Barbecue sauce. Teriyaki sauce. Soy sauce, including reduced-sodium. Steak sauce. Canned and packaged gravies. Fish sauce. Oyster sauce. Cocktail sauce. Store-bought horseradish. Ketchup. Mustard. Meat flavorings and tenderizers. Bouillon cubes. Hot sauces. Pre-made or packaged marinades. Pre-made or packaged taco seasonings. Relishes. Regular salad dressings. Other foods Salted popcorn and pretzels. The items listed above may not be a complete list of foods and beverages you should avoid. Contact a dietitian for more information. Where to find more information National Heart, Lung, and Blood Institute: https://wilson-eaton.com/ American Heart Association: www.heart.org Academy of Nutrition and Dietetics: www.eatright.[place]: www.kidney.org Summary The DASH eating plan  is a healthy eating plan that has been shown to reduce high blood pressure (hypertension). It may also reduce your risk for type 2 diabetes, heart disease, and stroke. When on the DASH eating plan, aim to eat more fresh fruits and vegetables, whole grains, lean proteins, low-fat dairy, and heart-healthy fats. With the DASH  eating plan, you should limit salt (sodium) intake to 2,300 mg a day. If you have hypertension, you may need to reduce your sodium intake to 1,500 mg a day. Work with your health care provider or dietitian to adjust your eating plan to your individual calorie needs. This information is not intended to replace advice given to you by your health care provider. Make sure you discuss any questions you have with your health care provider. Document Revised: 10/22/2019 Document Reviewed: 10/22/2019 Elsevier Patient Education  2023 [employer].

## 2022-07-03 NOTE — Assessment & Plan Note (Addendum)
New diagnosis, numerous documented elevated readings.  Long discussion regarding options for treating hypertension which include weight loss and medication management.  He opts for both. Start losartan 50 mg daily.  We will plan to see him in the office for follow-up in 2 to 3 weeks for blood pressure check and BMP.  We discussed a Dash diet, the importance of daily walking.

## 2022-07-03 NOTE — Assessment & Plan Note (Signed)
Controlled with A1C of 7.1 today.  Long discussion regarding the absolute need to start checking glucose levels.  Continue Jardiance 10 mg daily, Tresiba 35 units daily, Metformin XR 1000 mg BID, glipizide 10 mg BID.   Foot exam UTD. Eye exam due. Pneumonia vaccine UTD.  Follow up in 6 months.

## 2022-07-03 NOTE — Progress Notes (Signed)
Subjective:    Patient ID: Joseph [name], male    DOB: [birth year], 51 y.o.   MRN: [id number]  Hypertension Associated symptoms include anxiety. Pertinent negatives include no chest pain or shortness of breath.  Anxiety Patient reports no chest pain or shortness of breath.    Diabetes Pertinent negatives for diabetes include no chest pain.    Joseph [name] is a very pleasant 51 y.o. male with a history of type 2 diabetes, COPD, chronic pain syndrome, hyperlipidemia, OSA, asthma, osteoarthritis who presents today for follow-up of diabetes and to discuss hypertension.  1) Type 2 Diabetes:  Current medications include: Metformin XR 1000 mg twice daily, glipizide 10 mg twice daily, Jardiance 10 mg daily, Tresiba 35 units daily  He is checking his blood glucose irregularly. No recent checks in 1 month as he has not been able to get his Freestyle sensors.   Last A1C: 7.5 in February 2023, 7.1 today Last Eye Exam: Due Last Foot Exam: Up-to-date Pneumonia Vaccination: 2019 Urine Microalbumin: Up-to-date Statin: Atorvastatin  Dietary changes since last visit:   Exercise: No regular exercise    BP Readings from Last 3 Encounters:  07/03/22 (!) 140/90  05/20/22 (!) 142/90  03/05/22 135/77    2) Essential Hypertension: No prior diagnosis. Several months ago he was at a follow up visit with another provider for a social security review and his BP was 190/109. He has a strong family history of hypertension.   He's been checking his BP at home recently which has been running 137/97, 145/91. Does not recall other readings. He is not able to exercise given his multiple right knee surgeries.   He denies chest pain. He's had no prior treatment for hypertension.   Wt Readings from Last 3 Encounters:  07/03/22 249 lb (112.9 kg)  05/20/22 241 lb (109.3 kg)  03/05/22 224 lb (101.6 kg)     Review of Systems  Respiratory:  Negative for shortness of breath.   Cardiovascular:  Negative  for chest pain.  Musculoskeletal:  Positive for arthralgias.  Neurological:  Negative for numbness.         Past Medical History:  Diagnosis Date   Acute encephalopathy 03/24/2018   Anxiety    Arthritis    oa left knee and lower back   Asthma    Cataract    hx of bilateral   CHF (congestive heart failure) (HCC)    Chronic back pain    Chronic pain of left knee    COPD (chronic obstructive pulmonary disease) (HCC)    Diabetes mellitus    type 2   DVT (deep venous thrombosis) ([place]) 01/09/2022   GERD (gastroesophageal reflux disease)    INGUINAL PAIN, LEFT 10/03/2009   Qualifier: Diagnosis of  By: Oneida Alar MD, Sandi L    Oral thrush 11/10/2018   Overdose    03-24-18 accidental   Rash and nonspecific skin eruption 03/12/2017   Septic arthritis ([place]) 01/09/2022   Shortness of breath    with heavy exertion   Sleep apnea     Social History   Socioeconomic History   Marital status: Divorced    Spouse name: Not on file   Number of children: Not on file   Years of education: Not on file   Highest education level: Not on file  Occupational History   Occupation: disability  Tobacco Use   Smoking status: Every Day    Packs/day: 2.00    Years: 26.00    Total pack  years: 52.00    Types: Cigarettes    Start date: 12/17/1993   Smokeless tobacco: Never   Tobacco comments:    States 2 ppd, wants to quit but has had 3 surgeries in 2 months  Vaping Use   Vaping Use: Former  Substance and Sexual Activity   Alcohol use: Never    Alcohol/week: 0.0 standard drinks of alcohol   Drug use: Never   Sexual activity: Yes    Birth control/protection: Surgical  Other Topics Concern   Not on file  Social History Narrative   Recent visit to [facility] in [place] d/t increased pain where he was prescribed percocet 7.5-325 mg for pain.   Social Determinants of Health   Financial Resource Strain: Medium Risk (04/17/2022)   Overall Financial Resource Strain (CARDIA)    Difficulty of Paying  Living Expenses: Somewhat hard  Food Insecurity: No Food Insecurity (04/17/2022)   Hunger Vital Sign    Worried About Running Out of Food in the Last Year: Never true    Ran Out of Food in the Last Year: Never true  Transportation Needs: Not on file  Physical Activity: Not on file  Stress: Not on file  Social Connections: Not on file  Intimate Partner Violence: Not on file    Past Surgical History:  Procedure Laterality Date   CARPAL TUNNEL RELEASE Left    CATARACT EXTRACTION W/PHACO Right 03/11/2016   Procedure: CATARACT EXTRACTION PHACO AND INTRAOCULAR LENS PLACEMENT ([place]);  Surgeon: Tonny Branch, MD;  Location: [facility];  Service: Ophthalmology;  Laterality: Right;  CDE 4.24   EYE SURGERY Right 2018   ioc with lens replacement   HERNIA REPAIR Bilateral 5621   umbilical   INCISION AND DRAINAGE ABSCESS Right 01/02/2022   Procedure: RIGHT KNEE ARTHROSCOPIC IRRIGATION AND DEBRIDEMENT;  Surgeon: Willaim Sheng, MD;  Location: [facility];  Service: Orthopedics;  Laterality: Right;   INCISION AND DRAINAGE ABSCESS Right 01/26/2022   Procedure: INCISION AND DRAINAGE ABSCESS POST OP RIGHT KNEE ARTHROSCOPY;  Surgeon: Willaim Sheng, MD;  Location: [facility];  Service: Orthopedics;  Laterality: Right;   KNEE ARTHROSCOPY WITH MEDIAL MENISECTOMY Left 04/22/2014   Procedure: LEFT KNEE ARTHROSCOPY WITH MEDIAL MENISECTOMY;  Surgeon: Carole Civil, MD;  Location: [facility];  Service: Orthopedics;  Laterality: Left;   KNEE ARTHROSCOPY WITH MEDIAL MENISECTOMY Right 12/06/2015   Procedure: RIGHT KNEE ARTHROSCOPY WITH MEDIAL MENISECTOMY;  Surgeon: Carole Civil, MD;  Location: [facility];  Service: Orthopedics;  Laterality: Right;   KNEE ARTHROSCOPY WITH MEDIAL MENISECTOMY Left 03/20/2017   Procedure: KNEE ARTHROSCOPY WITH MEDIAL MENISECTOMY;  Surgeon: Carole Civil, MD;  Location: [facility];  Service: Orthopedics;  Laterality: Left;   ORIF WRIST FRACTURE Left 12/19/2016   Procedure: OPEN  REDUCTION INTERNAL FIXATION (ORIF) WRIST FRACTURE;  Surgeon: Leanora Cover, MD;  Location: [facility];  Service: Orthopedics;  Laterality: Left;  accumed    PARTIAL KNEE ARTHROPLASTY Left 07/21/2018   Procedure: LEFT UNICOMPARTMENTAL KNEE;  Surgeon: Renette Butters, MD;  Location: [facility];  Service: Orthopedics;  Laterality: Left;   SHOULDER ARTHROCENTESIS Right 2008 or 2010   VASECTOMY  2014    Family History  Problem Relation Age of Onset   Emphysema Maternal Grandfather    Emphysema Maternal Grandmother    Clotting disorder Maternal Grandmother     Allergies  Allergen Reactions   Azithromycin Hives    01/27/22 - pt states that he had a Zpak a couple years ago  with no reaction   Clarithromycin Hives   Erythromycin Hives   Keflex [Cephalexin] Nausea Only   Metformin Nausea And Vomiting   Symbicort [Budesonide-Formoterol Fumarate] Other (See Comments)    States that 3 doses were used and breathing became worse    Current Outpatient Medications on File Prior to Visit  Medication Sig Dispense Refill   albuterol (PROVENTIL) (2.5 MG/3ML) 0.083% nebulizer solution Take 3 mLs (2.5 mg total) by nebulization every 4 (four) hours as needed for wheezing or shortness of breath. 360 mL 3   atorvastatin (LIPITOR) 40 MG tablet TAKE ONE (1) TABLET BY MOUTH EVERY DAY FOR CHOLESTEROL 90 tablet 0   blood glucose meter kit and supplies KIT Dispense based on patient and insurance preference. Use up to four times daily as directed. (FOR ICD-9 250.00, 250.01). 1 each 0   buPROPion (WELLBUTRIN SR) 150 MG 12 hr tablet Take 1 tablet (150 mg total) by mouth 2 (two) times daily. For anxiety and depression. 180 tablet 3   cetirizine (ZYRTEC) 10 MG tablet Take 1 tablet by mouth at bedtime for allergies. 90 tablet 2   citalopram (CELEXA) 40 MG tablet Take 1 tablet (40 mg total) by mouth daily. For anxiety and depression. 90 tablet 3   Continuous Blood Gluc Sensor (FREESTYLE LIBRE 2 SENSOR) MISC by  Does not apply route. Apply sensor every 14 days to monitor sugar continuously. Via [facility]     empagliflozin (JARDIANCE) 10 MG TABS tablet Take 1 tablet (10 mg total) by mouth daily. for diabetes. Office visit required for further refills. 90 tablet 0   fluticasone (FLONASE) 50 MCG/ACT nasal spray Place 2 sprays into both nostrils daily as needed for allergies or rhinitis. 48 g 3   fluticasone furoate-vilanterol (BREO ELLIPTA) 200-25 MCG/INH AEPB Inhale 1 puff into the lungs daily. 90 each 3   gabapentin (NEURONTIN) 300 MG capsule Take 2 capsules (600 mg total) by mouth 2 (two) times daily. For pain. 360 capsule 2   glipiZIDE (GLUCOTROL) 10 MG tablet Take 1 tablet (10 mg total) by mouth 2 (two) times daily before a meal. For diabetes. 180 tablet 1   hydrOXYzine (ATARAX) 10 MG tablet Take 1-2 tablets (10-20 mg total) by mouth 2 (two) times daily as needed. For anxiety. 180 tablet 0   insulin degludec (TRESIBA) 100 UNIT/ML FlexTouch Pen Inject 35 Units into the skin daily. For diabetes. 30 mL 1   Insulin Pen Needle (UNIFINE PENTIPS) 31G X 6 MM MISC USE NIGHTLY WITH INSULIN 100 each 3   Ipratropium-Albuterol (COMBIVENT RESPIMAT) 20-100 MCG/ACT AERS respimat INHALE ONE PUFF INTO THE LUNGS EVERY FOUR HOURS AS NEEDED FOR WHEEZING 4 g 0   Lancets (ONETOUCH DELICA PLUS [id number]) MISC USE TO CHECK BLOOD SUGAR UP TO FOUR TIMES DAILY 100 each 5   lansoprazole (PREVACID) 30 MG capsule Take 1 capsule (30 mg total) by mouth 2 (two) times daily before a meal. For heartburn. 180 capsule 3   metFORMIN (GLUCOPHAGE-XR) 500 MG 24 hr tablet TAKE TWO TABLETS (1000MG TOTAL) BY MOUTHTWO TIMES DAILY WITH A MEAL FOR DIABETES 360 tablet 0   methocarbamol (ROBAXIN) 500 MG tablet Take 500 mg by mouth at bedtime.     MOVANTIK 25 MG TABS tablet Take 25 mg by mouth every morning.     ONETOUCH VERIO test strip USE TO CHECK BLOOD SUGAR UP TO FOUR TIMES DAILY 100 strip 5   OVER THE COUNTER MEDICATION Take 2 tablets by mouth every  morning. Osteo Biflex triple  strength with turmeric     OVER THE COUNTER MEDICATION Take 1 tablet by mouth every morning. Neuriva     Oxycodone HCl 10 MG TABS Take 10 mg by mouth every 6 (six) hours as needed.     tadalafil (CIALIS) 20 MG tablet Take 20 mg by mouth daily as needed for erectile dysfunction.     tamsulosin (FLOMAX) 0.4 MG CAPS capsule TAKE ONE CAPSULE BY MOUTH DAILY 90 capsule 1   Tapentadol HCl 100 MG TABS Take 100 mg by mouth 3 (three) times daily as needed (pain).     Testosterone 40.5 MG/2.5GM (1.62%) GEL Place 2.5 g onto the skin every morning. 2 pumps - 2.5 g     zolpidem (AMBIEN) 10 MG tablet TAKE ONE TABLET (10MG TOTAL) BY MOUTH ATBEDTIME AS NEEDED FOR SLEEP 90 tablet 0   No current facility-administered medications on file prior to visit.    BP (!) 140/90   Pulse (!) 104   Temp 98.6 F (37 C) (Oral)   Ht _0  (1.778 m)   Wt 249 lb (112.9 kg)   SpO2 99%   BMI 35.73 kg/m  Objective:   Physical Exam Cardiovascular:     Rate and Rhythm: Normal rate and regular rhythm.  Pulmonary:     Effort: Pulmonary effort is normal.     Breath sounds: Normal breath sounds. No wheezing or rales.  Musculoskeletal:     Cervical back: Neck supple.  Skin:    General: Skin is warm and dry.  Neurological:     Mental Status: He is alert and oriented to person, place, and time.           Assessment & Plan:   Problem List Items Addressed This Visit       Cardiovascular and Mediastinum   Essential hypertension    New diagnosis, numerous documented elevated readings.  Long discussion regarding options for treating hypertension which include weight loss and medication management.  He opts for both. Start losartan 50 mg daily.  We will plan to see him in the office for follow-up in 2 to 3 weeks for blood pressure check and BMP.  We discussed a Dash diet, the importance of daily walking.      Relevant Medications   losartan (COZAAR) 50 MG tablet     Endocrine    Type 2 diabetes mellitus with hyperglycemia (HCC) - Primary    Controlled with A1C of 7.1 today.  Long discussion regarding the absolute need to start checking glucose levels.  Continue Jardiance 10 mg daily, Tresiba 35 units daily, Metformin XR 1000 mg BID, glipizide 10 mg BID.   Foot exam UTD. Eye exam due. Pneumonia vaccine UTD.  Follow up in 6 months.      Relevant Medications   losartan (COZAAR) 50 MG tablet   Other Relevant Orders   POCT glycosylated hemoglobin (Hb A1C) (Completed)       Pleas Koch, NP

## 2022-07-04 DIAGNOSIS — J302 Other seasonal allergic rhinitis: Secondary | ICD-10-CM

## 2022-07-04 MED ORDER — CETIRIZINE HCL 10 MG PO TABS: ORAL_TABLET | ORAL | 1 refills | Status: DC

## 2022-07-09 ENCOUNTER — Other Ambulatory Visit: Payer: Self-pay | Admitting: Internal Medicine

## 2022-07-09 ENCOUNTER — Other Ambulatory Visit: Payer: Self-pay | Admitting: Primary Care

## 2022-07-09 DIAGNOSIS — E1165 Type 2 diabetes mellitus with hyperglycemia: Secondary | ICD-10-CM

## 2022-07-09 DIAGNOSIS — E119 Type 2 diabetes mellitus without complications: Secondary | ICD-10-CM

## 2022-07-16 ENCOUNTER — Telehealth: Payer: Self-pay

## 2022-07-16 NOTE — Progress Notes (Signed)
Chronic Care Management Pharmacy Assistant   Name: Joseph [name]  MRN: [id number] DOB: 12-25-1970  Reason for Encounter: CCM (Appointment Reminder)  Medications: Outpatient Encounter Medications as of 07/16/2022  Medication Sig   albuterol (PROVENTIL) (2.5 MG/3ML) 0.083% nebulizer solution Take 3 mLs (2.5 mg total) by nebulization every 4 (four) hours as needed for wheezing or shortness of breath.   atorvastatin (LIPITOR) 40 MG tablet TAKE ONE (1) TABLET BY MOUTH EVERY DAY FOR CHOLESTEROL   blood glucose meter kit and supplies KIT Dispense based on patient and insurance preference. Use up to four times daily as directed. (FOR ICD-9 250.00, 250.01).   buPROPion (WELLBUTRIN SR) 150 MG 12 hr tablet Take 1 tablet (150 mg total) by mouth 2 (two) times daily. For anxiety and depression.   cetirizine (ZYRTEC) 10 MG tablet Take 1 tablet by mouth at bedtime for allergies.   citalopram (CELEXA) 40 MG tablet Take 1 tablet (40 mg total) by mouth daily. For anxiety and depression.   Continuous Blood Gluc Sensor (FREESTYLE LIBRE 2 SENSOR) MISC by Does not apply route. Apply sensor every 14 days to monitor sugar continuously. Via [facility]   empagliflozin (JARDIANCE) 10 MG TABS tablet Take 1 tablet (10 mg total) by mouth daily. for diabetes. Office visit required for further refills.   fluticasone (FLONASE) 50 MCG/ACT nasal spray Place 2 sprays into both nostrils daily as needed for allergies or rhinitis.   fluticasone furoate-vilanterol (BREO ELLIPTA) 200-25 MCG/ACT AEPB INHALE ONE PUFF INTO THE LUNGS DAILY   gabapentin (NEURONTIN) 300 MG capsule Take 2 capsules (600 mg total) by mouth 2 (two) times daily. For pain.   glipiZIDE (GLUCOTROL) 10 MG tablet Take 1 tablet (10 mg total) by mouth 2 (two) times daily before a meal. For diabetes.   hydrOXYzine (ATARAX) 10 MG tablet Take 1-2 tablets (10-20 mg total) by mouth 2 (two) times daily as needed. For anxiety.   insulin degludec (TRESIBA FLEXTOUCH) 100  UNIT/ML FlexTouch Pen Inject 35 Units into the skin daily.   Insulin Pen Needle (UNIFINE PENTIPS) 31G X 6 MM MISC USE NIGHTLY WITH INSULIN   Ipratropium-Albuterol (COMBIVENT RESPIMAT) 20-100 MCG/ACT AERS respimat INHALE ONE PUFF INTO THE LUNGS EVERY FOUR HOURS AS NEEDED FOR WHEEZING   Lancets (ONETOUCH DELICA PLUS [id number]) MISC USE TO CHECK BLOOD SUGAR UP TO FOUR TIMES DAILY   lansoprazole (PREVACID) 30 MG capsule Take 1 capsule (30 mg total) by mouth 2 (two) times daily before a meal. For heartburn.   losartan (COZAAR) 50 MG tablet Take 1 tablet (50 mg total) by mouth daily. For blood pressure.   metFORMIN (GLUCOPHAGE-XR) 500 MG 24 hr tablet TAKE TWO TABLETS (1000MG TOTAL) BY MOUTHTWO TIMES DAILY WITH A MEAL FOR DIABETES   methocarbamol (ROBAXIN) 500 MG tablet Take 500 mg by mouth at bedtime.   MOVANTIK 25 MG TABS tablet Take 25 mg by mouth every morning.   ONETOUCH VERIO test strip USE TO CHECK BLOOD SUGAR UP TO FOUR TIMES DAILY   OVER THE COUNTER MEDICATION Take 2 tablets by mouth every morning. Osteo Biflex triple strength with turmeric   OVER THE COUNTER MEDICATION Take 1 tablet by mouth every morning. Neuriva   Oxycodone HCl 10 MG TABS Take 10 mg by mouth every 6 (six) hours as needed.   tadalafil (CIALIS) 20 MG tablet Take 20 mg by mouth daily as needed for erectile dysfunction.   tamsulosin (FLOMAX) 0.4 MG CAPS capsule TAKE ONE CAPSULE BY MOUTH DAILY   Tapentadol  HCl 100 MG TABS Take 100 mg by mouth 3 (three) times daily as needed (pain).   Testosterone 40.5 MG/2.5GM (1.62%) GEL Place 2.5 g onto the skin every morning. 2 pumps - 2.5 g   zolpidem (AMBIEN) 10 MG tablet TAKE ONE TABLET (10MG TOTAL) BY MOUTH ATBEDTIME AS NEEDED FOR SLEEP   No facility-administered encounter medications on file as of 07/16/2022.   CLAUD GOWAN was contacted to remind of upcoming telephone visit with Charlene Brooke on 07/19/22 at 11:00. Patient was reminded to have any blood glucose and blood pressure  readings available for review at appointment.   Message was left reminding patient of appointment.  CCM referral has been placed prior to visit?  No   Star Rating Drugs: Medication:  Last Fill: Day Supply Atorvastatin 40 mg      06/17/22          90 Glipizide 10 mg           07/09/22          90 Jardiance 10 mg         06/05/22          90 Metformin 500 mg       07/10/22          [place], CPP notified  Marijean Niemann, [place] Pharmacy Assistant [phone]

## 2022-07-19 ENCOUNTER — Ambulatory Visit: Payer: Self-pay | Admitting: Pharmacist

## 2022-07-19 DIAGNOSIS — E119 Type 2 diabetes mellitus without complications: Secondary | ICD-10-CM

## 2022-07-19 DIAGNOSIS — I10 Essential (primary) hypertension: Secondary | ICD-10-CM

## 2022-07-19 DIAGNOSIS — E785 Hyperlipidemia, unspecified: Secondary | ICD-10-CM

## 2022-07-19 MED ORDER — GLUCOSE BLOOD VI STRP: ORAL_STRIP | 12 refills | Status: AC

## 2022-07-19 MED ORDER — BLOOD GLUCOSE MONITOR KIT: PACK | 0 refills | Status: AC

## 2022-07-19 MED ORDER — GLUCOSE BLOOD VI STRP: ORAL_STRIP | 12 refills | Status: DC

## 2022-07-19 MED ORDER — LANCETS MISC: 3 refills | Status: AC

## 2022-07-19 MED ORDER — BLOOD GLUCOSE MONITOR KIT: PACK | 0 refills | Status: DC

## 2022-07-19 MED ORDER — LANCETS MISC: 3 refills | Status: DC

## 2022-07-19 NOTE — Patient Instructions (Signed)
Visit Information  Phone number for Pharmacist: [phone]   Goals Addressed   None     Care Plan : CCM Pharmacy Care Plan  Updates made by Kathyrn Sheriff, RPH since 07/19/2022 12:00 AM     Problem: Hypertension, Hyperlipidemia, Diabetes, COPD, Anxiety, and Tobacco use, Insomnia, chronic pain   Priority: High     Long-Range Goal: Disease mgmt   Start Date: 04/17/2022  Expected End Date: 04/18/2023  This Visit's Progress: On track  Recent Progress: On track  Priority: High  Note:   Current Barriers:  Access to testing supplies (no Part B) Continues to smoke 2 ppd, not ready to quit   Pharmacist Clinical Goal(s):  Patient will adhere to plan to optimize therapeutic regimen for DM as evidenced by report of adherence to recommended medication management changes through collaboration with PharmD and provider.    Interventions: 1:1 collaboration with Doreene Nest, NP regarding development and update of comprehensive plan of care as evidenced by provider attestation and co-signature Inter-disciplinary care team collaboration (see longitudinal plan of care) Comprehensive medication review performed; medication list updated in electronic medical record   Hypertension (BP goal <130/80) -Controlled - new Dx 07/2022; pt reports home BP has improved significantly with medication -Denies hypotensive/hypertensive symptoms -Current home readings: 128/88 -Current treatment: Losartan 50 mg daily - Appropriate, Effective, Safe, Accessible -Medications previously tried: n/a  -Educated on BP goals and benefits of medications for prevention of heart attack, stroke and kidney damage; -Counseled to monitor BP at home daily, document, and provide log at future appointments -Recommended to continue current medication; f/u with PCP as scheduled next week for in office BP check  Diabetes (A1c goal <7%) -Controlled- A1c 7.1% (07/2022) improved from 7.5%; -Current home glucose readings:  none available, he cannot locate his testing supplies; he reports he does not have Medicare Part B so getting testing supplies has been troublesome -Current medications: Jardiance 10 mg daily - Appropriate, Effective, Safe, Accessible Glipizide 10 mg BID -Appropriate, Effective, Safe, Accessible Tresiba 35 units daily -Appropriate, Effective, Safe, Accessible Metformin ER 500 mg - 2 tab BID -Appropriate, Effective, Safe, Accessible Freestyle Libre 3 - not using (cost) -Medications previously tried: n/a  -Educated on A1c and blood sugar goals; -Discussed CGM - since he is on insulin Medicare should cover [employer]; will coordinate with DME ([facility]) for [employer] -Counseled to check feet daily and get yearly eye exams - pt ok to schedule eye exam at [facility] 06/12/22, will coordinate with staff for scheduling -Refilled testing supplies to pharmacy; advised pt to buy OTC Relion supplies at [facility] if insurance overage is an issue   Hyperlipidemia: (LDL goal < 70) -Controlled - LDL 58 (10/2021); statin on hold while on IV daptomacyin but pt reports he has restarted this without issue since IV abx are complete -Current treatment: Atorvastatin 40 mg daily - Appropriate, Effective, Safe, Accessible -Medications previously tried: n/a  -Educated on Cholesterol goals; Benefits of statin for ASCVD risk reduction; -Pt was concerned about statin drug interactions - reviewed his med list, there are no concerning interactions currently -Recommended to continue current medication   COPD (Goal: control symptoms and prevent exacerbations) -Controlled -Gold Grade: Gold 2 (FEV1 50-79%) -Current COPD Classification:  A (low sx, <2 exacerbations/yr) -Pulmonary function testing: 10/2015 - FEV1 73% predicted, ratio 0.84 -Exacerbations requiring treatment in last 6 months: 0 -Current treatment  Breo Ellipta 200-25 mcg/act 1 puff daily - Appropriate, Effective, Safe, Accessible Albuterol nebulizer  -Appropriate, Effective, Safe,  Accessible Combivent Respimat PRN -Appropriate, Effective, Safe, Accessible -Medications previously tried: Josefine Class, Sherrye Payor -Patient reports consistent use of maintenance inhaler -Frequency of rescue inhaler use: rare -Counseled on Benefits of consistent maintenance inhaler use -Recommended to continue current medication   Tobacco use (Goal: cessation) -Uncontrolled - smoking 2 ppd, 52 pack-years. Not addressed today.   Anxiety (Goal: manage symptoms) -Not ideally controlled - pt reports hydroxyzine has not helped at all, he is not using it anymore. He is compliant with bupropion, citalopram.  -Current treatment  Bupropion SR 150 mg BID - Appropriate, Query Effective Citalopram 40 mg daily - Appropriate, Query Effective Hydroxyzine 10-20 mg BID prn - not taking -Medications previously tried: alprazolam, diazepam, lorazepam -Recommended to continue current medication   Insomnia (Goal: 6-8 hours of sleep/night) -Controlled - pt feels he cannot sleep if he does not take zolpidem or use CPAP; he also feels it is pain is the reason for insomnia when he can't sleep, he report he sleeps well most of the time -Current treatment: Zolpidem 10 mg HS - Appropriate, Effective, Query Safe -Medications previously tried: trazodone -Counseled on sleep hygiene techniques (consistent sleep/wake up schedule, no electronic screens 1-2 hours before bed, etc) -Reviewed side effects of zolpidem including oversedation, slowed reaction times; pt denies any issues -Recommended to continue current medication  Chronic pain (Goal: manage symptoms) -Reasonably controlled - pt reports he is in pain 24/7, but pain levels are manageable with current regimen -s/p meniscal tear, chronic knee pain following with pain mgmt, ortho, PT -s/p pyogenic arthritis and 6 weeks of IV abx (ended 03/2022), hoping for TKA   -Current treatment  Nucynta 100 mg TID prn - Appropriate, Effective,  Safe, Accessible Oxycodone 10 mg q6h PRN -Appropriate, Effective, Safe, Accessible Cyclobenzaprine 10 mg PRN -Appropriate, Effective, Safe, Accessible Gabapentin 300 mg - 2 cap BID -Appropriate, Effective, Safe, Accessible Methocarbamol 500 mg HS -Appropriate, Effective, Safe, Accessible Movantik 25 mg daily -Appropriate, Effective, Safe, Accessible -Medications previously tried: n/a  -Pt was concerned about gabapentin and risk for cognitive decline - there have been studies that suggest gabapentin is associated with cognitive decline and falls; advised pt to consider benefits/risk of medication -Recommended to continue current medication   Health Maintenance -Vaccine gaps: none -Hx provoked DVT (01/04/22 after knee surgery), completed 3 months of Xarelto  Patient Goals/Self-Care Activities Patient will:  - take medications as prescribed as evidenced by patient report and record review focus on medication adherence by routine      Patient verbalizes understanding of instructions and care plan provided today and agrees to view in MyChart. Active MyChart status and patient understanding of how to access instructions and care plan via MyChart confirmed with patient.    Telephone follow up appointment with pharmacy team member scheduled for: 6 months  Al Corpus, PharmD, [facility] Clinical Pharmacist [facility] [phone]

## 2022-07-19 NOTE — Progress Notes (Signed)
Chronic Care Management Pharmacy Note  07/19/2022 Name:  Joseph [name] MRN:  [id number] DOB:  Dec 14, 1970  Summary: CCM F/U visit -HTN (new Dx): pt endorses compliance with losartan and reports BP at home has improved (128/88 today) -DM: A1c 7.1% (01/2022), pt reports he does not have any BG testing supplies, he does not have Part B right now so coverage has been an issue. He is the process of trying to get Part B coverage.  Recommendations/Changes made from today's visit: -Refilled glucometer/supplies to pharmacy; advised pt if cost is an issue he can buy OTC Relion brand supplies at [facility] for reduced price  Plan: -[place] will call patient 3 months for BP update -Pharmacist follow up televisit scheduled for 6 months -PCP F/U 07/24/22   Subjective: Joseph [name] is an 51 y.o. year old male who is a primary patient of Pleas Koch, NP.  The CCM team was consulted for assistance with disease management and care coordination needs.    Engaged with patient by telephone for follow up visit in response to provider referral for pharmacy case management and/or care coordination services.   Consent to Services:  The patient was given information about Chronic Care Management services, agreed to services, and gave verbal consent prior to initiation of services.  Please see initial visit note for detailed documentation.   Patient Care Team: Pleas Koch, NP as PCP - General (Internal Medicine) Charlton Haws, [facility] as Pharmacist (Pharmacist)   S/p R menisectomy 12/20/21, developed sepsis, PICC placed 01/15/22. DVT 01/04/22. Out of work since Oct 2022, mostly in wheelchair now.  Recent office visits: 07/03/22 NP Allie Bossier OV: f/u BP, DM - BP 140/90, start Losartan 50 mg daily; A1c 7.1%. f/u 2-3 wks for BP check. Need to check BG levels.  02/21/22 NP Allie Bossier OV: f/u anxiety; Start hydroxyzine 10-20 mg BID prn   10/23/22 NP Allie Bossier OV: chronic f/u;  Recent  consult visits: 05/20/22 Dr Melvyn Novas (Pulmonology): f/u asthma - no changes, rec annual LDCT  03/05/22 Dr West Bali (ID): f/u pyogenic arthritis on IV abx. EOT 03/09/22. 02/13/22 Dr West Bali (ID): f/u osteomyelitis. OPAT: Cefepime. D/c daptomycyin. 01/22/22 Dr Stanford Breed (Vascular/Vein): initial visit for acute DVT. Continue Xarelto for 3 months total. Hold Xarelto for interventions. 12/14/21 NP Valera Castle (Urology): f/u hypogonadism. Refilled Androgel, Flomax, Cialis.  Hospital visits: Medication Reconciliation was completed by comparing discharge summary, patient's EMR and Pharmacy list, and upon discussion with patient.   03/01/22 ED visit [facility]): fall - f/u ortho.  Admitted to the hospital on 01/25/22 due to Septic arthritis. Discharge date was 01/30/22. Discharged from [facility]?Medications Started at [facility] Discharge:?? -started Cefepime and Daptomycin due to resistant infection   Medications that remain the same after Hospital Discharge:??  -All other medications will remain the same.     Admission 01/02/22 - R knee I&D.   Objective:  Lab Results  Component Value Date   CREATININE 0.74 03/01/2022   BUN 5 (L) 03/01/2022   GFR 84.95 05/03/2020   EGFR 99 01/11/2022   GFRNONAA >60 03/01/2022   GFRAA >60 07/13/2018   NA 135 03/01/2022   K 3.7 03/01/2022   CALCIUM 9.4 03/01/2022   CO2 23 03/01/2022   GLUCOSE 157 (H) 03/01/2022    Lab Results  Component Value Date/Time   HGBA1C 7.1 (A) 07/03/2022 09:24 AM   HGBA1C 7.5 (H) 01/26/2022 03:58 AM   HGBA1C 8.9 (A) 10/23/2021 11:42 AM  HGBA1C 10.8 (H) 08/23/2019 08:57 AM   GFR 84.95 05/03/2020 02:54 PM   GFR 78.56 03/08/2019 10:03 AM   MICROALBUR <0.7 05/03/2020 02:49 PM   MICROALBUR <0.7 05/21/2019 09:13 AM    Last diabetic Eye exam:  Lab Results  Component Value Date/Time   HMDIABEYEEXA No Retinopathy 11/03/2017 12:00 AM    Last diabetic Foot exam: No results found for: "HMDIABFOOTEX"   Lab Results   Component Value Date   CHOL 135 10/23/2021   HDL 34 (L) 10/23/2021   LDLCALC 58 10/23/2021   LDLDIRECT 78.0 05/03/2020   TRIG 273 (H) 10/23/2021   CHOLHDL 4.0 10/23/2021       Latest Ref Rng & Units 10/23/2021   12:27 PM 05/03/2020    2:54 PM 03/08/2019   10:03 AM  Hepatic Function  Total Protein 6.0 - 8.5 g/dL 7.0  6.9  7.3   Albumin 3.8 - 4.9 g/dL 4.6  4.6  4.6   AST 0 - 40 IU/L _0 ALT 0 - 44 IU/L 38  18  20   Alk Phosphatase 44 - 121 IU/L 113  98  109   Total Bilirubin 0.0 - 1.2 mg/dL 0.6  0.6  0.5     Lab Results  Component Value Date/Time   TSH 2.25 05/03/2020 02:54 PM   TSH 1.700 08/02/2016 09:47 AM       Latest Ref Rng & Units 03/01/2022   10:15 PM 01/30/2022    4:03 AM 01/29/2022    3:16 AM  CBC  WBC 4.0 - 10.5 K/uL 15.1  10.5  8.9   Hemoglobin 13.0 - 17.0 g/dL 13.5  11.4  11.2   Hematocrit 39.0 - 52.0 % 42.5  33.8  33.8   Platelets 150 - 400 K/uL 280  291  274     No results found for: "VD25OH"  Clinical ASCVD: No  The 10-year ASCVD risk score (Arnett DK, et al., 2019) is: 19%   Values used to calculate the score:     Age: 75 years     Sex: Male     Is Non-Hispanic African American: No     Diabetic: Yes     Tobacco smoker: Yes     Systolic Blood Pressure: 099 mmHg     Is BP treated: Yes     HDL Cholesterol: 34 mg/dL     Total Cholesterol: 135 mg/dL       03/05/2022   11:13 AM 02/21/2022    8:10 AM 01/11/2022    2:17 PM  Depression screen PHQ 2/9  Decreased Interest 0 1 0  Down, Depressed, Hopeless 0 3 0  PHQ - 2 Score 0 4 0  Altered sleeping  3   Tired, decreased energy  3   Change in appetite  3   Feeling bad or failure about yourself   0   Trouble concentrating  3   Moving slowly or fidgety/restless  0   Suicidal thoughts  0   PHQ-9 Score  16   Difficult doing work/chores  Very difficult      Social History   Tobacco Use  Smoking Status Every Day   Packs/day: 2.00   Years: 26.00   Total pack years: 52.00   Types: Cigarettes    Start date: 12/17/1993  Smokeless Tobacco Never  Tobacco Comments   States 2 ppd, wants to quit but has had 3 surgeries in 2 months   BP Readings from Last 3 Encounters:  07/03/22 (!) 140/90  05/20/22 (!) 142/90  03/05/22 135/77   Pulse Readings from Last 3 Encounters:  07/03/22 (!) 104  05/20/22 74  03/05/22 (!) 105   Wt Readings from Last 3 Encounters:  07/03/22 249 lb (112.9 kg)  05/20/22 241 lb (109.3 kg)  03/05/22 224 lb (101.6 kg)   BMI Readings from Last 3 Encounters:  07/03/22 35.73 kg/m  05/20/22 34.58 kg/m  03/05/22 32.14 kg/m    Assessment/Interventions: Review of patient past medical history, allergies, medications, health status, including review of consultants reports, laboratory and other test data, was performed as part of comprehensive evaluation and provision of chronic care management services.   SDOH:  (Social Determinants of Health) assessments and interventions performed: No   SDOH Screenings   Alcohol Screen: Not on file  Depression (PHQ2-9): Low Risk  (03/05/2022)   Depression (PHQ2-9)    PHQ-2 Score: 0  Recent Concern: Depression (PHQ2-9) - Medium Risk (02/21/2022)   Depression (PHQ2-9)    PHQ-2 Score: 16  Financial Resource Strain: Medium Risk (04/17/2022)   Overall Financial Resource Strain (CARDIA)    Difficulty of Paying Living Expenses: Somewhat hard  Food Insecurity: No Food Insecurity (04/17/2022)   Hunger Vital Sign    Worried About Running Out of Food in the Last Year: Never true    Ran Out of Food in the Last Year: Never true  Housing: Not on file  Physical Activity: Not on file  Social Connections: Not on file  Stress: Not on file  Tobacco Use: High Risk (05/21/2022)   Patient History    Smoking Tobacco Use: Every Day    Smokeless Tobacco Use: Never    Passive Exposure: Not on file  Transportation Needs: Not on file    [place]  Allergies  Allergen Reactions   Azithromycin Hives    01/27/22 - pt states that he had  a Zpak a couple years ago with no reaction   Clarithromycin Hives   Erythromycin Hives   Keflex [Cephalexin] Nausea Only   Metformin Nausea And Vomiting   Symbicort [Budesonide-Formoterol Fumarate] Other (See Comments)    States that 3 doses were used and breathing became worse    Medications Reviewed Today     Reviewed by Charlton Haws, RPH (Pharmacist) on 07/19/22 at [place] List Status: <None>   Medication Order Taking? Sig Documenting Provider Last Dose Status Informant  albuterol (PROVENTIL) (2.5 MG/3ML) 0.083% nebulizer solution [id number] Yes Take 3 mLs (2.5 mg total) by nebulization every 4 (four) hours as needed for wheezing or shortness of breath. Tanda Rockers, MD Taking Active   atorvastatin (LIPITOR) 40 MG tablet [id number] Yes TAKE ONE (1) TABLET BY MOUTH EVERY DAY FOR CHOLESTEROL Pleas Koch, NP Taking Active   blood glucose meter kit and supplies KIT [id number]  Dispense based on patient and insurance preference. Use up to four times daily as directed. (Dx code E11.65) Pleas Koch, NP  Active   buPROPion [facility] SR) 150 MG 12 hr tablet [id number] Yes Take 1 tablet (150 mg total) by mouth 2 (two) times daily. For anxiety and depression. Pleas Koch, NP Taking Active Self, Pharmacy Records  cetirizine (ZYRTEC) 10 MG tablet [id number] Yes Take 1 tablet by mouth at bedtime for allergies. Pleas Koch, NP Taking Active   citalopram (CELEXA) 40 MG tablet [id number] Yes Take 1 tablet (40 mg total) by mouth daily. For anxiety and depression. Pleas Koch, NP Taking Active Self, Pharmacy Records  Continuous Blood Gluc Sensor (FREESTYLE LIBRE 2 SENSOR) MISC [id number] No by Does not apply route. Apply sensor every 14 days to monitor sugar continuously. Via [facility]  Patient not taking: Reported on 07/19/2022   [provider] Not Taking Active   empagliflozin (JARDIANCE) 10 MG TABS tablet [id number] Yes Take 1 tablet (10 mg total) by  mouth daily. for diabetes. Office visit required for further refills. Pleas Koch, NP Taking Active   fluticasone [facility]) 50 MCG/ACT nasal spray [id number] Yes Place 2 sprays into both nostrils daily as needed for allergies or rhinitis. Pleas Koch, NP Taking Active Self, Pharmacy Records  fluticasone furoate-vilanterol [facility] ELLIPTA) 200-25 MCG/ACT AEPB [id number] Yes INHALE ONE PUFF INTO THE LUNGS DAILY Tanda Rockers, MD Taking Active   gabapentin (NEURONTIN) 300 MG capsule [id number] Yes Take 2 capsules (600 mg total) by mouth 2 (two) times daily. For pain. Pleas Koch, NP Taking Active Self, Pharmacy Records  glipiZIDE (GLUCOTROL) 10 MG tablet [id number] Yes Take 1 tablet (10 mg total) by mouth 2 (two) times daily before a meal. For diabetes. Pleas Koch, NP Taking Active Self, Pharmacy Records  glucose blood test strip [id number]  Use as instructed to test blood sugar up to 4 times daily. (Dx code E11.65) Pleas Koch, NP  Active   hydrOXYzine (ATARAX) 10 MG tablet [id number] Yes Take 1-2 tablets (10-20 mg total) by mouth 2 (two) times daily as needed. For anxiety. Pleas Koch, NP Taking Active   insulin degludec (TRESIBA FLEXTOUCH) 100 UNIT/ML FlexTouch Pen [id number] Yes Inject 35 Units into the skin daily. Pleas Koch, NP Taking Active   Insulin Pen Needle (UNIFINE PENTIPS) 31G X 6 MM MISC [id number] Yes USE NIGHTLY WITH INSULIN Pleas Koch, NP Taking Active Other  Ipratropium-Albuterol (COMBIVENT RESPIMAT) 20-100 MCG/ACT AERS respimat [id number] Yes INHALE ONE PUFF INTO THE LUNGS EVERY FOUR HOURS AS NEEDED FOR WHEEZING Tanda Rockers, MD Taking Active Self, Pharmacy Records  Lancets MISC [id number]  Use as directed to check blood sugar up to 4 times daily. (Dx code E11.65) Pleas Koch, NP  Active   lansoprazole (PREVACID) 30 MG capsule [id number] Yes Take 1 capsule (30 mg total) by mouth 2 (two) times daily before a meal. For heartburn.  Pleas Koch, NP Taking Active Self, Pharmacy Records  losartan (COZAAR) 50 MG tablet [id number] Yes Take 1 tablet (50 mg total) by mouth daily. For blood pressure. Pleas Koch, NP Taking Active   metFORMIN (GLUCOPHAGE-XR) 500 MG 24 hr tablet [id number] Yes TAKE TWO TABLETS (1000MG TOTAL) BY MOUTHTWO TIMES DAILY WITH A MEAL FOR DIABETES Pleas Koch, NP Taking Active   methocarbamol (ROBAXIN) 500 MG tablet [id number] Yes Take 500 mg by mouth at bedtime. [provider] Taking Active Self, Pharmacy Records  MOVANTIK 25 MG TABS tablet [id number] Yes Take 25 mg by mouth every morning. [provider] Taking Active Self, Pharmacy Records  OVER THE COUNTER MEDICATION [id number] Yes Take 2 tablets by mouth every morning. Osteo Biflex triple strength with turmeric [provider] Taking Active Self  OVER THE COUNTER MEDICATION [id number] Yes Take 1 tablet by mouth every morning. Neuriva [provider] Taking Active Self  Oxycodone HCl 10 MG TABS [id number] Yes Take 10 mg by mouth every 6 (six) hours as needed. [provider] Taking Active   tadalafil (CIALIS) 20 MG tablet [id number] Yes Take 20 mg by mouth daily as needed for erectile dysfunction. [provider] Taking  Active Self, Pharmacy Records  tamsulosin (FLOMAX) 0.4 MG CAPS capsule [id number] Yes TAKE ONE CAPSULE BY MOUTH DAILY Pleas Koch, NP Taking Active Self, Pharmacy Records  Tapentadol HCl 100 MG TABS [id number] Yes Take 100 mg by mouth 3 (three) times daily as needed (pain). [provider] Taking Active Self, Pharmacy Records           Med Note Rolan Bucco Apr 11, 2022  9:24 AM)    Testosterone 40.5 MG/2.5GM (1.62%) GEL [id number] Yes Place 2.5 g onto the skin every morning. 2 pumps - 2.5 g [provider] Taking Active Self, Pharmacy Records  zolpidem (AMBIEN) 10 MG tablet [id number] Yes TAKE ONE TABLET (10MG TOTAL) BY MOUTH ATBEDTIME AS  NEEDED FOR SLEEP Pleas Koch, NP Taking Active   Med List Note Gwynne Edinger 01/27/22 3559): Marland Kitchen            Patient Active Problem List   Diagnosis Date Noted   Essential hypertension 07/03/2022   Pyogenic arthritis of right knee joint ([place]) 03/05/2022   PICC (peripherally inserted central catheter) in place 03/05/2022   Osteomyelitis ([place]) 02/14/2022   Serratia marcescens infection 02/14/2022   Medication monitoring encounter 02/14/2022   Smoking 02/14/2022   Calf abscess    Septic arthritis of knee ([place]) 01/25/2022   Septic arthritis of knee, right ([place]) 01/09/2022   DVT (deep venous thrombosis) ([place]) 01/09/2022   Preoperative clearance 11/20/2021   Post-viral cough syndrome 11/20/2021   COVID-19 10/31/2021   Cervical lymphadenitis 11/27/2020   Preventative health care 07/05/2020   Leukocytosis 05/04/2020   Chronic fatigue 05/03/2020   Medicare annual wellness visit, subsequent 06/28/2019   Controlled substance agreement broken 09/14/2018   Primary osteoarthritis of knee 07/21/2018   Status post unicompartmental knee replacement, left 07/21/2018   Medicare annual wellness visit, initial 05/20/2018   Effusion of knee joint (Left) 04/21/2018   Opiate overdose ([place]) 03/24/2018   Arthropathy of knee (Left) 01/12/2018   Opioid-induced constipation (OIC) 01/12/2018   Tinea corporis 01/07/2018   Tinea versicolor 01/07/2018   Pharmacologic therapy 12/08/2017   Problems influencing health status 12/08/2017   Long term prescription opiate use 12/08/2017   Opiate use 12/08/2017   Insomnia 11/20/2017   Disorder of skeletal system 11/05/2017   Chronic knee pain (Primary Area of Pain) (Bilateral) (L>R) 11/05/2017   Chronic wrist pain (Secondary Area of Pain) (Left) 11/05/2017   Chronic constipation 08/18/2017   Dyslipidemia 05/12/2017   COPD without exacerbation ([place]) 05/07/2017   Male hypogonadism 04/24/2017   Post-void dribbling 04/01/2017   Osteoarthritis  of the knee (Left)    GERD (gastroesophageal reflux disease) 03/12/2017   Bucket handle tear of meniscus of knee (Right)    Anxiety and depression 10/07/2015   Class 1 obesity due to excess calories with body mass index (BMI) of 33.0 to 33.9 in adult 10/06/2015   OSA (obstructive sleep apnea) 08/31/2014   Testosterone deficiency 07/01/2014   S/P knee surgery 04/26/2014   Meniscal tear of knee, sequela (Left) 04/26/2014   Medial meniscus, posterior horn derangement 04/22/2014   Intrinsic asthma 12/19/2013   Chronic pain syndrome 05/13/2013   Type 2 diabetes mellitus with hyperglycemia ([place]) 05/13/2013   Mediastinal abnormality 04/10/2012   Cigarette smoker 04/10/2012    Immunization History  Administered Date(s) Administered   Influenza Split 09/02/2011   Influenza,inj,Quad PF,6+ Mos 12/01/2014, 08/22/2015, 09/03/2016, 10/21/2017, 11/10/2018, 08/27/2019, 02/23/2021, 10/23/2021   Pneumococcal Polysaccharide-23 12/03/2007, 11/10/2018   Td 10/21/2017  Zoster Recombinat (Shingrix) 07/05/2020, 02/23/2021    Conditions to be addressed/monitored:  Hypertension, Hyperlipidemia, Diabetes, COPD, Anxiety, and Tobacco use, Insomnia, chronic pain  Care Plan : [place]  Updates made by Charlton Haws, [place] since 07/19/2022 12:00 AM     Problem: Hypertension, Hyperlipidemia, Diabetes, COPD, Anxiety, and Tobacco use, Insomnia, chronic pain   Priority: High     Long-Range Goal: Disease mgmt   Start Date: 04/17/2022  Expected End Date: 04/18/2023  This Visit's Progress: On track  Recent Progress: On track  Priority: High  Note:   Current Barriers:  Access to testing supplies (no Part B) Continues to smoke 2 ppd, not ready to quit   Pharmacist Clinical Goal(s):  Patient will adhere to plan to optimize therapeutic regimen for DM as evidenced by report of adherence to recommended medication management changes through collaboration with PharmD and provider.     Interventions: 1:1 collaboration with Pleas Koch, NP regarding development and update of comprehensive plan of care as evidenced by provider attestation and co-signature Inter-disciplinary care team collaboration (see longitudinal plan of care) Comprehensive medication review performed; medication list updated in electronic medical record   Hypertension (BP goal <130/80) -Controlled - new Dx 07/2022; pt reports home BP has improved significantly with medication -Denies hypotensive/hypertensive symptoms -Current home readings: 128/88 -Current treatment: Losartan 50 mg daily - Appropriate, Effective, Safe, Accessible -Medications previously tried: n/a  -Educated on BP goals and benefits of medications for prevention of heart attack, stroke and kidney damage; -Counseled to monitor BP at home daily, document, and provide log at future appointments -Recommended to continue current medication; f/u with PCP as scheduled next week for in office BP check  Diabetes (A1c goal <7%) -Controlled- A1c 7.1% (07/2022) improved from 7.5%; -Current home glucose readings: none available, he cannot locate his testing supplies; he reports he does not have Medicare Part B so getting testing supplies has been troublesome -Current medications: Jardiance 10 mg daily - Appropriate, Effective, Safe, Accessible Glipizide 10 mg BID -Appropriate, Effective, Safe, Accessible Tresiba 35 units daily -Appropriate, Effective, Safe, Accessible Metformin ER 500 mg - 2 tab BID -Appropriate, Effective, Safe, Accessible Freestyle Libre 3 - not using (cost) -Medications previously tried: n/a  -Educated on A1c and blood sugar goals; -Discussed CGM - since he is on insulin Medicare should cover [employer] 2; will coordinate with DME ([facility]) for [employer] 2 -Counseled to check feet daily and get yearly eye exams - pt ok to schedule eye exam at [facility] 06/12/22, will coordinate with staff for scheduling -Refilled  testing supplies to pharmacy; advised pt to buy OTC Relion supplies at [facility] if insurance overage is an issue   Hyperlipidemia: (LDL goal < 70) -Controlled - LDL 58 (10/2021); statin on hold while on IV daptomacyin but pt reports he has restarted this without issue since IV abx are complete -Current treatment: Atorvastatin 40 mg daily - Appropriate, Effective, Safe, Accessible -Medications previously tried: n/a  -Educated on Cholesterol goals; Benefits of statin for ASCVD risk reduction; -Pt was concerned about statin drug interactions - reviewed his med list, there are no concerning interactions currently -Recommended to continue current medication   COPD (Goal: control symptoms and prevent exacerbations) -Controlled -Gold Grade: Gold 2 (FEV1 50-79%) -Current COPD Classification:  A (low sx, <2 exacerbations/yr) -Pulmonary function testing: 10/2015 - FEV1 73% predicted, ratio 0.84 -Exacerbations requiring treatment in last 6 months: 0 -Current treatment  Breo Ellipta 200-25 mcg/act 1 puff daily - Appropriate, Effective, Safe,  Accessible Albuterol nebulizer -Appropriate, Effective, Safe, Accessible Combivent Respimat PRN -Appropriate, Effective, Safe, Accessible -Medications previously tried: Samuel [place], Stann Ore -Patient reports consistent use of maintenance inhaler -Frequency of rescue inhaler use: rare -Counseled on Benefits of consistent maintenance inhaler use -Recommended to continue current medication   Tobacco use (Goal: cessation) -Uncontrolled - smoking 2 ppd, 52 pack-years. Not addressed today.   Anxiety (Goal: manage symptoms) -Not ideally controlled - pt reports hydroxyzine has not helped at all, he is not using it anymore. He is compliant with bupropion, citalopram.  -Current treatment  Bupropion SR 150 mg BID - Appropriate, Query Effective Citalopram 40 mg daily - Appropriate, Query Effective Hydroxyzine 10-20 mg BID prn - not taking -Medications previously  tried: alprazolam, diazepam, lorazepam -Recommended to continue current medication   Insomnia (Goal: 6-8 hours of sleep/night) -Controlled - pt feels he cannot sleep if he does not take zolpidem or use CPAP; he also feels it is pain is the reason for insomnia when he can't sleep, he report he sleeps well most of the time -Current treatment: Zolpidem 10 mg HS - Appropriate, Effective, Query Safe -Medications previously tried: trazodone -Counseled on sleep hygiene techniques (consistent sleep/wake up schedule, no electronic screens 1-2 hours before bed, etc) -Reviewed side effects of zolpidem including oversedation, slowed reaction times; pt denies any issues -Recommended to continue current medication  Chronic pain (Goal: manage symptoms) -Reasonably controlled - pt reports he is in pain 24/7, but pain levels are manageable with current regimen -s/p meniscal tear, chronic knee pain following with pain mgmt, ortho, PT -s/p pyogenic arthritis and 6 weeks of IV abx (ended 03/2022), hoping for TKA   -Current treatment  Nucynta 100 mg TID prn - Appropriate, Effective, Safe, Accessible Oxycodone 10 mg q6h PRN -Appropriate, Effective, Safe, Accessible Cyclobenzaprine 10 mg PRN -Appropriate, Effective, Safe, Accessible Gabapentin 300 mg - 2 cap BID -Appropriate, Effective, Safe, Accessible Methocarbamol 500 mg HS -Appropriate, Effective, Safe, Accessible Movantik 25 mg daily -Appropriate, Effective, Safe, Accessible -Medications previously tried: n/a  -Pt was concerned about gabapentin and risk for cognitive decline - there have been studies that suggest gabapentin is associated with cognitive decline and falls; advised pt to consider benefits/risk of medication -Recommended to continue current medication   Health Maintenance -Vaccine gaps: none -Hx provoked DVT (01/04/22 after knee surgery), completed 3 months of Xarelto  Patient Goals/Self-Care Activities Patient will:  - take medications as  prescribed as evidenced by patient report and record review focus on medication adherence by routine       Medication Assistance: None required.  Patient affirms current coverage meets needs.  Compliance/Adherence/Medication fill history: Care Gaps: Eye exam (due 11/03/18) Colonoscopy  Star-Rating Drugs: Atorvastatin - LF 01/25/22 x 90 ds, on hold 3/1 - 3/16 while on daptomycin  Medication Access: Within the past 30 days, how often has patient missed a dose of medication? 0 Is a pillbox or other method used to improve adherence? Yes  Factors that may affect medication adherence? no barriers identified Are meds synced by current pharmacy? No  Are meds delivered by current pharmacy? No  Does patient experience delays in picking up medications due to transportation concerns? No   [facility] Services Reviewed: Is patient disadvantaged to use [facility]?: No  Current Rx insurance plan: [employer] PDP Name and location of Current pharmacy:  [facility], [address] [place] [address] Phone: [phone] Fax: [phone]  [facility] services reviewed with patient today?: No  Patient requests to transfer care to [facility]?: No  Reason patient declined to change pharmacies: Loyalty to other pharmacy/Patient preference   Care Plan and Follow Up Patient Decision:  Patient agrees to Care Plan and Follow-up.  Plan: Telephone follow up appointment with care management team member scheduled for:  6 months  Charlene Brooke, PharmD, BCACP Clinical Pharmacist [facility] [phone]

## 2022-07-24 ENCOUNTER — Ambulatory Visit: Payer: Self-pay | Admitting: Primary Care

## 2022-07-27 ENCOUNTER — Other Ambulatory Visit: Payer: Self-pay | Admitting: Internal Medicine

## 2022-07-30 ENCOUNTER — Ambulatory Visit (INDEPENDENT_AMBULATORY_CARE_PROVIDER_SITE_OTHER): Payer: Self-pay | Admitting: Primary Care

## 2022-07-30 ENCOUNTER — Encounter: Payer: Self-pay | Admitting: Primary Care

## 2022-07-30 VITALS — BP 132/74 | HR 110 | Temp 98.6°F | Ht 70.0 in | Wt 254.0 lb

## 2022-07-30 DIAGNOSIS — I10 Essential (primary) hypertension: Secondary | ICD-10-CM

## 2022-07-30 NOTE — Assessment & Plan Note (Signed)
Improved.  BMP due and pending. Continue losartan 50 mg daily.   Discussed to work on lifestyle changes.

## 2022-07-30 NOTE — Progress Notes (Signed)
Subjective:    Patient ID: Joseph [name], male    DOB: [birth year], 51 y.o.   MRN: [id number]  Hypertension Associated symptoms include headaches. Pertinent negatives include no chest pain or shortness of breath.    Joseph [name] is a very pleasant 51 y.o. male with a history of hypertension, OSA, type 2 diabetes, hyperlipidemia, obesity, tobacco abuse who presents today for follow-up of hypertension.   He was last evaluated on 07/03/2022 for evaluation of of low blood pressure readings.  No prior history of hypertension, but during this visit and a prior visit he was noted to be above goal.  He had his blood pressure checked through the Social Security office which was 190/109.  Given his uncontrolled readings, coupled with his personal and family history, we decided to treat.  He was initiated on losartan 50 mg daily and asked to follow-up in a few weeks.  Since his last visit he is compliant to losartan 50 mg daily.  He is checking his blood pressure at home which is running 130's/80's. His headaches from uncontrolled blood pressure have improved. Overall he's feeling a lot better. He denies dizziness.   BP Readings from Last 3 Encounters:  07/30/22 132/74  07/03/22 (!) 140/90  05/20/22 (!) 142/90      Review of Systems  Constitutional:  Negative for fatigue.  Eyes:  Negative for visual disturbance.  Respiratory:  Negative for shortness of breath.   Cardiovascular:  Negative for chest pain.  Neurological:  Positive for headaches.       Headaches improved         Past Medical History:  Diagnosis Date   Acute encephalopathy 03/24/2018   Anxiety    Arthritis    oa left knee and lower back   Asthma    Cataract    hx of bilateral   CHF (congestive heart failure) (HCC)    Chronic back pain    Chronic pain of left knee    COPD (chronic obstructive pulmonary disease) (HCC)    Diabetes mellitus    type 2   DVT (deep venous thrombosis) ([place]) 01/09/2022   GERD  (gastroesophageal reflux disease)    INGUINAL PAIN, LEFT 10/03/2009   Qualifier: Diagnosis of  By: Oneida Alar MD, Sandi L    Oral thrush 11/10/2018   Overdose    03-24-18 accidental   Rash and nonspecific skin eruption 03/12/2017   Septic arthritis ([place]) 01/09/2022   Shortness of breath    with heavy exertion   Sleep apnea     Social History   Socioeconomic History   Marital status: Divorced    Spouse name: Not on file   Number of children: Not on file   Years of education: Not on file   Highest education level: Not on file  Occupational History   Occupation: disability  Tobacco Use   Smoking status: Every Day    Packs/day: 2.00    Years: 26.00    Total pack years: 52.00    Types: Cigarettes    Start date: 12/17/1993   Smokeless tobacco: Never   Tobacco comments:    States 2 ppd, wants to quit but has had 3 surgeries in 2 months  Vaping Use   Vaping Use: Former  Substance and Sexual Activity   Alcohol use: Never    Alcohol/week: 0.0 standard drinks of alcohol   Drug use: Never   Sexual activity: Yes    Birth control/protection: Surgical  Other Topics Concern   Not  on file  Social History Narrative   Recent visit to [facility] in [place] d/t increased pain where he was prescribed percocet 7.5-325 mg for pain.   Social Determinants of Health   Financial Resource Strain: Medium Risk (04/17/2022)   Overall Financial Resource Strain (CARDIA)    Difficulty of Paying Living Expenses: Somewhat hard  Food Insecurity: No Food Insecurity (04/17/2022)   Hunger Vital Sign    Worried About Running Out of Food in the Last Year: Never true    Ran Out of Food in the Last Year: Never true  Transportation Needs: Not on file  Physical Activity: Not on file  Stress: Not on file  Social Connections: Not on file  Intimate Partner Violence: Not on file    Past Surgical History:  Procedure Laterality Date   CARPAL TUNNEL RELEASE Left    CATARACT EXTRACTION W/PHACO Right 03/11/2016    Procedure: CATARACT EXTRACTION PHACO AND INTRAOCULAR LENS PLACEMENT ([place]);  Surgeon: Tonny Branch, MD;  Location: [facility];  Service: Ophthalmology;  Laterality: Right;  CDE 4.24   EYE SURGERY Right 2018   ioc with lens replacement   HERNIA REPAIR Bilateral 2637   umbilical   INCISION AND DRAINAGE ABSCESS Right 01/02/2022   Procedure: RIGHT KNEE ARTHROSCOPIC IRRIGATION AND DEBRIDEMENT;  Surgeon: Willaim Sheng, MD;  Location: [facility];  Service: Orthopedics;  Laterality: Right;   INCISION AND DRAINAGE ABSCESS Right 01/26/2022   Procedure: INCISION AND DRAINAGE ABSCESS POST OP RIGHT KNEE ARTHROSCOPY;  Surgeon: Willaim Sheng, MD;  Location: [facility];  Service: Orthopedics;  Laterality: Right;   KNEE ARTHROSCOPY WITH MEDIAL MENISECTOMY Left 04/22/2014   Procedure: LEFT KNEE ARTHROSCOPY WITH MEDIAL MENISECTOMY;  Surgeon: Carole Civil, MD;  Location: [facility];  Service: Orthopedics;  Laterality: Left;   KNEE ARTHROSCOPY WITH MEDIAL MENISECTOMY Right 12/06/2015   Procedure: RIGHT KNEE ARTHROSCOPY WITH MEDIAL MENISECTOMY;  Surgeon: Carole Civil, MD;  Location: [facility];  Service: Orthopedics;  Laterality: Right;   KNEE ARTHROSCOPY WITH MEDIAL MENISECTOMY Left 03/20/2017   Procedure: KNEE ARTHROSCOPY WITH MEDIAL MENISECTOMY;  Surgeon: Carole Civil, MD;  Location: [facility];  Service: Orthopedics;  Laterality: Left;   ORIF WRIST FRACTURE Left 12/19/2016   Procedure: OPEN REDUCTION INTERNAL FIXATION (ORIF) WRIST FRACTURE;  Surgeon: Leanora Cover, MD;  Location: [facility];  Service: Orthopedics;  Laterality: Left;  accumed    PARTIAL KNEE ARTHROPLASTY Left 07/21/2018   Procedure: LEFT UNICOMPARTMENTAL KNEE;  Surgeon: Renette Butters, MD;  Location: [facility];  Service: Orthopedics;  Laterality: Left;   SHOULDER ARTHROCENTESIS Right 2008 or 2010   VASECTOMY  2014    Family History  Problem Relation Age of Onset   Emphysema Maternal Grandfather    Emphysema  Maternal Grandmother    Clotting disorder Maternal Grandmother     Allergies  Allergen Reactions   Azithromycin Hives    01/27/22 - pt states that he had a Zpak a couple years ago with no reaction   Clarithromycin Hives   Erythromycin Hives   Keflex [Cephalexin] Nausea Only   Metformin Nausea And Vomiting   Symbicort [Budesonide-Formoterol Fumarate] Other (See Comments)    States that 3 doses were used and breathing became worse    Current Outpatient Medications on File Prior to Visit  Medication Sig Dispense Refill   albuterol (PROVENTIL) (2.5 MG/3ML) 0.083% nebulizer solution Take 3 mLs (2.5 mg total) by nebulization every 4 (four) hours as needed for wheezing or shortness of breath.  360 mL 3   atorvastatin (LIPITOR) 40 MG tablet TAKE ONE (1) TABLET BY MOUTH EVERY DAY FOR CHOLESTEROL 90 tablet 0   blood glucose meter kit and supplies KIT Dispense based on patient and insurance preference. Use up to four times daily as directed. (Dx code E11.65) 1 each 0   buPROPion (WELLBUTRIN SR) 150 MG 12 hr tablet Take 1 tablet (150 mg total) by mouth 2 (two) times daily. For anxiety and depression. 180 tablet 3   cetirizine (ZYRTEC) 10 MG tablet Take 1 tablet by mouth at bedtime for allergies. 90 tablet 1   citalopram (CELEXA) 40 MG tablet Take 1 tablet (40 mg total) by mouth daily. For anxiety and depression. 90 tablet 3   empagliflozin (JARDIANCE) 10 MG TABS tablet Take 1 tablet (10 mg total) by mouth daily. for diabetes. Office visit required for further refills. 90 tablet 0   fluticasone (FLONASE) 50 MCG/ACT nasal spray Place 2 sprays into both nostrils daily as needed for allergies or rhinitis. 48 g 3   fluticasone furoate-vilanterol (BREO ELLIPTA) 200-25 MCG/ACT AEPB INHALE ONE PUFF INTO THE LUNGS DAILY 180 each 3   gabapentin (NEURONTIN) 300 MG capsule Take 2 capsules (600 mg total) by mouth 2 (two) times daily. For pain. 360 capsule 2   glipiZIDE (GLUCOTROL) 10 MG tablet Take 1 tablet (10 mg  total) by mouth 2 (two) times daily before a meal. For diabetes. 180 tablet 1   glucose blood test strip Use as instructed to test blood sugar up to 4 times daily. (Dx code E11.65) 100 each 12   hydrOXYzine (ATARAX) 10 MG tablet Take 1-2 tablets (10-20 mg total) by mouth 2 (two) times daily as needed. For anxiety. 180 tablet 0   insulin degludec (TRESIBA FLEXTOUCH) 100 UNIT/ML FlexTouch Pen Inject 35 Units into the skin daily. 30 mL 1   Insulin Pen Needle (UNIFINE PENTIPS) 31G X 6 MM MISC USE NIGHTLY WITH INSULIN 100 each 3   Ipratropium-Albuterol (COMBIVENT RESPIMAT) 20-100 MCG/ACT AERS respimat INHALE ONE PUFF INTO THE LUNGS EVERY FOUR HOURS AS NEEDED FOR WHEEZING 4 g 5   Lancets MISC Use as directed to check blood sugar up to 4 times daily. (Dx code E11.65) 100 each 3   lansoprazole (PREVACID) 30 MG capsule Take 1 capsule (30 mg total) by mouth 2 (two) times daily before a meal. For heartburn. 180 capsule 3   losartan (COZAAR) 50 MG tablet Take 1 tablet (50 mg total) by mouth daily. For blood pressure. 30 tablet 0   meloxicam (MOBIC) 15 MG tablet Take 15 mg by mouth daily as needed.     metFORMIN (GLUCOPHAGE-XR) 500 MG 24 hr tablet TAKE TWO TABLETS (1000MG TOTAL) BY MOUTHTWO TIMES DAILY WITH A MEAL FOR DIABETES 360 tablet 1   methocarbamol (ROBAXIN) 500 MG tablet Take 500 mg by mouth at bedtime.     MOVANTIK 25 MG TABS tablet Take 25 mg by mouth every morning.     NUCYNTA ER 200 MG TB12 Take 1 tablet by mouth 2 (two) times daily.     OVER THE COUNTER MEDICATION Take 2 tablets by mouth every morning. Osteo Biflex triple strength with turmeric     OVER THE COUNTER MEDICATION Take 1 tablet by mouth every morning. Neuriva     tadalafil (CIALIS) 20 MG tablet Take 20 mg by mouth daily as needed for erectile dysfunction.     tamsulosin (FLOMAX) 0.4 MG CAPS capsule TAKE ONE CAPSULE BY MOUTH DAILY 90 capsule 1  tapentadol (NUCYNTA) 50 MG tablet      Testosterone 40.5 MG/2.5GM (1.62%) GEL Place 2.5 g  onto the skin every morning. 2 pumps - 2.5 g     zolpidem (AMBIEN) 10 MG tablet TAKE ONE TABLET (10MG TOTAL) BY MOUTH ATBEDTIME AS NEEDED FOR SLEEP 90 tablet 0   Continuous Blood Gluc Sensor (FREESTYLE LIBRE 2 SENSOR) MISC by Does not apply route. Apply sensor every 14 days to monitor sugar continuously. Via [facility] (Patient not taking: Reported on 07/19/2022)     No current facility-administered medications on file prior to visit.    BP 132/74   Pulse (!) 110   Temp 98.6 F (37 C) (Oral)   Ht _0  (1.778 m)   Wt 254 lb (115.2 kg)   SpO2 97%   BMI 36.45 kg/m  Objective:   Physical Exam Cardiovascular:     Rate and Rhythm: Normal rate and regular rhythm.  Pulmonary:     Effort: Pulmonary effort is normal.     Breath sounds: Normal breath sounds. No wheezing or rales.  Musculoskeletal:     Cervical back: Neck supple.  Skin:    General: Skin is warm and dry.  Neurological:     Mental Status: He is alert and oriented to person, place, and time.           Assessment & Plan:   Problem List Items Addressed This Visit       Cardiovascular and Mediastinum   Essential hypertension - Primary    Improved.  BMP due and pending. Continue losartan 50 mg daily.   Discussed to work on lifestyle changes.      Relevant Orders   Basic metabolic panel       Pleas Koch, NP

## 2022-07-30 NOTE — Patient Instructions (Signed)
Stop by the lab prior to leaving today. I will notify you of your results once received.   Continue taking losartan 50 mg daily for blood pressure.  It was a pleasure to see you today!

## 2022-07-31 ENCOUNTER — Other Ambulatory Visit: Payer: Self-pay | Admitting: Primary Care

## 2022-07-31 DIAGNOSIS — I10 Essential (primary) hypertension: Secondary | ICD-10-CM

## 2022-07-31 LAB — BASIC METABOLIC PANEL
BUN: 8 mg/dL (ref 6–23)
CO2: 21 mEq/L (ref 19–32)
Calcium: 9.3 mg/dL (ref 8.4–10.5)
Chloride: 101 mEq/L (ref 96–112)
Creatinine, Ser: 0.8 mg/dL (ref 0.40–1.50)
GFR: 101.94 mL/min (ref >60.00)
Glucose, Bld: 199 mg/dL — ABNORMAL HIGH (ref 70–99)
Potassium: 3.8 mEq/L (ref 3.5–5.1)
Sodium: 138 mEq/L (ref 135–145)

## 2022-07-31 MED ORDER — LOSARTAN POTASSIUM 50 MG PO TABS: 50.0000 mg | ORAL_TABLET | Freq: Every day | ORAL | 3 refills | Status: DC

## 2022-08-10 ENCOUNTER — Other Ambulatory Visit: Payer: Self-pay | Admitting: Primary Care

## 2022-08-10 DIAGNOSIS — G4700 Insomnia, unspecified: Secondary | ICD-10-CM

## 2022-08-10 DIAGNOSIS — G8929 Other chronic pain: Secondary | ICD-10-CM

## 2022-08-14 ENCOUNTER — Other Ambulatory Visit: Payer: Self-pay | Admitting: Primary Care

## 2022-08-14 DIAGNOSIS — E119 Type 2 diabetes mellitus without complications: Secondary | ICD-10-CM

## 2022-08-23 ENCOUNTER — Encounter: Payer: Self-pay | Admitting: Family

## 2022-08-23 ENCOUNTER — Telehealth (INDEPENDENT_AMBULATORY_CARE_PROVIDER_SITE_OTHER): Payer: Self-pay | Admitting: Family

## 2022-08-23 VITALS — BP 123/86 | HR 112 | Temp 99.6°F | Ht 70.0 in | Wt 254.0 lb

## 2022-08-23 DIAGNOSIS — J302 Other seasonal allergic rhinitis: Secondary | ICD-10-CM

## 2022-08-23 DIAGNOSIS — J40 Bronchitis, not specified as acute or chronic: Secondary | ICD-10-CM

## 2022-08-23 MED ORDER — CETIRIZINE HCL 10 MG PO TABS: 10.0000 mg | ORAL_TABLET | Freq: Every day | ORAL | 11 refills | Status: DC

## 2022-08-23 MED ORDER — AMOXICILLIN-POT CLAVULANATE 875-125 MG PO TABS: 1.0000 | ORAL_TABLET | Freq: Two times a day (BID) | ORAL | 0 refills | Status: DC

## 2022-08-23 NOTE — Assessment & Plan Note (Signed)
Recommend pt restart daily flonase 50 mcg and sent in refill for zyrtec 10 mg

## 2022-08-23 NOTE — Patient Instructions (Signed)
  Recommend going back to nightly zyrtec and daily flonase.  Also suggest over the counter mucinex (without DM)  Prescription given for augmentin 875/125 mg po bid for ten days. Pt to continue tylenol/ibuprofen prn sinus pain. Continue with humidifier prn and steam showers recommended as well. instructed If no symptom improvement in 48 hours please f/u   Regards,   Adela Esteban FNP-C

## 2022-08-23 NOTE — Assessment & Plan Note (Signed)
rx augmentin 875/125 mg po bid x 10 days Take antibiotic as prescribed. Increase oral fluids. Pt to f/u if sx worsen and or fail to improve in 2-3 days. Recommend otc mucinex without DM   Will hold off of steroid at current due to pt diabetic status  Recommended cxr if no improvement and or physical exam in office, will defer for now

## 2022-08-23 NOTE — Progress Notes (Signed)
MyChart Video Visit    Virtual Visit via Video Note   This visit type was conducted due to national recommendations for restrictions regarding the COVID-19 Pandemic (e.g. social distancing) in an effort to limit this patient's exposure and mitigate transmission in our community. This patient is at least at moderate risk for complications without adequate follow up. This format is felt to be most appropriate for this patient at this time. Physical exam was limited by quality of the video and audio technology used for the visit. CMA was able to get the patient set up on a video visit.  Patient location: Home. Patient and provider in visit Provider location: Office  I discussed the limitations of evaluation and management by telemedicine and the availability of in person appointments. The patient expressed understanding and agreed to proceed.  Visit Date: 08/23/2022  Today's healthcare provider: Eugenia Pancoast, FNP     Subjective:    Patient ID: Joseph [name], male    DOB: [birth year], 51 y.o.   MRN: [id number]  Chief Complaint  Patient presents with   Sore Throat    And congestion x 2 days. Covid test at home 2 days ago neg.    Sore Throat     Pt here today via video visit with concerns.   Over the last two days with sore throat and he states pretty 'viscious' . Feels like metal has scratched his throat. Bad nasal congestion and chest congestion for the last one week. His family in the house has had RSV, he wasn't tested if he did or didn't but still has ongoing symptoms. Has a cough that is productive ,and will cough 5-6 times and will get light headed from coughing so much. Only slight sinus pressure.   Did take covid test two days ago and negative.   Took otc cold flu medication with slight relief but still chest congestion.  Temp was 99.7.  No fever or chills.  Is having to use nebulizer 3-4 times a day.   Past Medical History:  Diagnosis Date   Acute encephalopathy  03/24/2018   Anxiety    Arthritis    oa left knee and lower back   Asthma    Bucket handle tear of meniscus of knee (Right)    Cataract    hx of bilateral   CHF (congestive heart failure) (HCC)    Chronic back pain    Chronic pain of left knee    COPD (chronic obstructive pulmonary disease) (HCC)    Diabetes mellitus    type 2   DVT (deep venous thrombosis) ([place]) 01/09/2022   GERD (gastroesophageal reflux disease)    INGUINAL PAIN, LEFT 10/03/2009   Qualifier: Diagnosis of  By: Oneida Alar MD, Sandi L    Medial meniscus, posterior horn derangement 04/22/2014   Oral thrush 11/10/2018   Osteomyelitis ([place]) 02/14/2022   Overdose    03-24-18 accidental   Rash and nonspecific skin eruption 03/12/2017   S/P knee surgery 04/26/2014   Surgery May 22 partial medial meniscectomy   Septic arthritis ([place]) 01/09/2022   Septic arthritis of knee ([place]) 01/25/2022   Septic arthritis of knee, right ([place]) 01/09/2022   Shortness of breath    with heavy exertion   Sleep apnea    Status post unicompartmental knee replacement, left 07/21/2018    Past Surgical History:  Procedure Laterality Date   CARPAL TUNNEL RELEASE Left    CATARACT EXTRACTION W/PHACO Right 03/11/2016   Procedure: CATARACT EXTRACTION PHACO AND INTRAOCULAR  LENS PLACEMENT (IOC);  Surgeon: Tonny Branch, MD;  Location: [facility];  Service: Ophthalmology;  Laterality: Right;  CDE 4.24   EYE SURGERY Right 2018   ioc with lens replacement   HERNIA REPAIR Bilateral 5638   umbilical   INCISION AND DRAINAGE ABSCESS Right 01/02/2022   Procedure: RIGHT KNEE ARTHROSCOPIC IRRIGATION AND DEBRIDEMENT;  Surgeon: Willaim Sheng, MD;  Location: [facility];  Service: Orthopedics;  Laterality: Right;   INCISION AND DRAINAGE ABSCESS Right 01/26/2022   Procedure: INCISION AND DRAINAGE ABSCESS POST OP RIGHT KNEE ARTHROSCOPY;  Surgeon: Willaim Sheng, MD;  Location: [facility];  Service: Orthopedics;  Laterality: Right;   KNEE ARTHROSCOPY WITH  MEDIAL MENISECTOMY Left 04/22/2014   Procedure: LEFT KNEE ARTHROSCOPY WITH MEDIAL MENISECTOMY;  Surgeon: Carole Civil, MD;  Location: [facility];  Service: Orthopedics;  Laterality: Left;   KNEE ARTHROSCOPY WITH MEDIAL MENISECTOMY Right 12/06/2015   Procedure: RIGHT KNEE ARTHROSCOPY WITH MEDIAL MENISECTOMY;  Surgeon: Carole Civil, MD;  Location: [facility];  Service: Orthopedics;  Laterality: Right;   KNEE ARTHROSCOPY WITH MEDIAL MENISECTOMY Left 03/20/2017   Procedure: KNEE ARTHROSCOPY WITH MEDIAL MENISECTOMY;  Surgeon: Carole Civil, MD;  Location: [facility];  Service: Orthopedics;  Laterality: Left;   ORIF WRIST FRACTURE Left 12/19/2016   Procedure: OPEN REDUCTION INTERNAL FIXATION (ORIF) WRIST FRACTURE;  Surgeon: Leanora Cover, MD;  Location: [facility];  Service: Orthopedics;  Laterality: Left;  accumed    PARTIAL KNEE ARTHROPLASTY Left 07/21/2018   Procedure: LEFT UNICOMPARTMENTAL KNEE;  Surgeon: Renette Butters, MD;  Location: [facility];  Service: Orthopedics;  Laterality: Left;   SHOULDER ARTHROCENTESIS Right 2008 or 2010   VASECTOMY  2014    Family History  Problem Relation Age of Onset   Emphysema Maternal Grandfather    Emphysema Maternal Grandmother    Clotting disorder Maternal Grandmother     Social History   Socioeconomic History   Marital status: Divorced    Spouse name: Not on file   Number of children: Not on file   Years of education: Not on file   Highest education level: Not on file  Occupational History   Occupation: disability  Tobacco Use   Smoking status: Every Day    Packs/day: 2.00    Years: 26.00    Total pack years: 52.00    Types: Cigarettes    Start date: 12/17/1993   Smokeless tobacco: Never   Tobacco comments:    States 2 ppd, wants to quit but has had 3 surgeries in 2 months  Vaping Use   Vaping Use: Former  Substance and Sexual Activity   Alcohol use: Never    Alcohol/week: 0.0 standard drinks of alcohol   Drug use: Never    Sexual activity: Yes    Birth control/protection: Surgical  Other Topics Concern   Not on file  Social History Narrative   [facility] visit to [facility] in [place] d/t increased pain where he was prescribed percocet 7.5-325 mg for pain.   Social Determinants of Health   Financial Resource Strain: Medium Risk (04/17/2022)   Overall Financial Resource Strain (CARDIA)    Difficulty of Paying Living Expenses: Somewhat hard  Food Insecurity: No Food Insecurity (04/17/2022)   Hunger Vital Sign    Worried About Running Out of Food in the Last Year: Never true    Ran Out of Food in the Last Year: Never true  Transportation Needs: Not on file  Physical Activity: Not on file  Stress: Not on file  Social Connections: Not on file  Intimate Partner Violence: Not on file    Outpatient Medications Prior to Visit  Medication Sig Dispense Refill   albuterol (PROVENTIL) (2.5 MG/3ML) 0.083% nebulizer solution Take 3 mLs (2.5 mg total) by nebulization every 4 (four) hours as needed for wheezing or shortness of breath. 360 mL 3   atorvastatin (LIPITOR) 40 MG tablet TAKE ONE (1) TABLET BY MOUTH EVERY DAY FOR CHOLESTEROL 90 tablet 0   blood glucose meter kit and supplies KIT Dispense based on patient and insurance preference. Use up to four times daily as directed. (Dx code E11.65) 1 each 0   buPROPion (WELLBUTRIN SR) 150 MG 12 hr tablet Take 1 tablet (150 mg total) by mouth 2 (two) times daily. For anxiety and depression. 180 tablet 3   citalopram (CELEXA) 40 MG tablet Take 1 tablet (40 mg total) by mouth daily. For anxiety and depression. 90 tablet 3   Continuous Blood Gluc Sensor (FREESTYLE LIBRE 2 SENSOR) MISC by Does not apply route. Apply sensor every 14 days to monitor sugar continuously. Via [facility]     empagliflozin (JARDIANCE) 10 MG TABS tablet Take 1 tablet (10 mg total) by mouth daily. for diabetes. Office visit required for further refills. 90 tablet 0   fluticasone (FLONASE) 50 MCG/ACT nasal spray  Place 2 sprays into both nostrils daily as needed for allergies or rhinitis. 48 g 3   fluticasone furoate-vilanterol (BREO ELLIPTA) 200-25 MCG/ACT AEPB INHALE ONE PUFF INTO THE LUNGS DAILY 180 each 3   gabapentin (NEURONTIN) 300 MG capsule Take 2 capsules (600 mg total) by mouth 2 (two) times daily. For pain. 360 capsule 2   glipiZIDE (GLUCOTROL) 10 MG tablet Take 1 tablet (10 mg total) by mouth 2 (two) times daily before a meal. For diabetes. 180 tablet 1   glucose blood test strip Use as instructed to test blood sugar up to 4 times daily. (Dx code E11.65) 100 each 12   hydrOXYzine (ATARAX) 10 MG tablet Take 1-2 tablets (10-20 mg total) by mouth 2 (two) times daily as needed. For anxiety. 180 tablet 0   insulin degludec (TRESIBA FLEXTOUCH) 100 UNIT/ML FlexTouch Pen Inject 35 Units into the skin daily. 30 mL 1   Ipratropium-Albuterol (COMBIVENT RESPIMAT) 20-100 MCG/ACT AERS respimat INHALE ONE PUFF INTO THE LUNGS EVERY FOUR HOURS AS NEEDED FOR WHEEZING 4 g 5   Lancets MISC Use as directed to check blood sugar up to 4 times daily. (Dx code E11.65) 100 each 3   lansoprazole (PREVACID) 30 MG capsule Take 1 capsule (30 mg total) by mouth 2 (two) times daily before a meal. For heartburn. 180 capsule 3   losartan (COZAAR) 50 MG tablet Take 1 tablet (50 mg total) by mouth daily. For blood pressure. 90 tablet 3   meloxicam (MOBIC) 15 MG tablet Take 15 mg by mouth daily as needed.     metFORMIN (GLUCOPHAGE-XR) 500 MG 24 hr tablet TAKE TWO TABLETS (1000MG TOTAL) BY MOUTHTWO TIMES DAILY WITH A MEAL FOR DIABETES 360 tablet 1   methocarbamol (ROBAXIN) 500 MG tablet Take 500 mg by mouth at bedtime.     MOVANTIK 25 MG TABS tablet Take 25 mg by mouth every morning.     NUCYNTA ER 200 MG TB12 Take 1 tablet by mouth 2 (two) times daily.     OVER THE COUNTER MEDICATION Take 2 tablets by mouth every morning. Osteo Biflex triple strength with turmeric     OVER  THE COUNTER MEDICATION Take 1 tablet by mouth every morning.  Neuriva     tadalafil (CIALIS) 20 MG tablet Take 20 mg by mouth daily as needed for erectile dysfunction.     tamsulosin (FLOMAX) 0.4 MG CAPS capsule TAKE ONE CAPSULE BY MOUTH DAILY 90 capsule 1   tapentadol (NUCYNTA) 50 MG tablet      Testosterone 40.5 MG/2.5GM (1.62%) GEL Place 2.5 g onto the skin every morning. 2 pumps - 2.5 g     UNIFINE PENTIPS 31G X 6 MM MISC USE NIGHTLY WITH INSULIN 100 each 3   zolpidem (AMBIEN) 10 MG tablet TAKE ONE TABLET (10MG TOTAL) BY MOUTH ATBEDTIME AS NEEDED FOR SLEEP 90 tablet 0   cetirizine (ZYRTEC) 10 MG tablet Take 1 tablet by mouth at bedtime for allergies. (Patient not taking: Reported on 08/23/2022) 90 tablet 1   No facility-administered medications prior to visit.    Allergies  Allergen Reactions   Azithromycin Hives    01/27/22 - pt states that he had a Zpak a couple years ago with no reaction   Clarithromycin Hives   Erythromycin Hives   Keflex [Cephalexin] Nausea Only   Metformin Nausea And Vomiting   Symbicort [Budesonide-Formoterol Fumarate] Other (See Comments)    States that 3 doses were used and breathing became worse        Objective:    Physical Exam Constitutional:      Appearance: Normal appearance.  Pulmonary:     Effort: Pulmonary effort is normal.  Neurological:     General: No focal deficit present.     Mental Status: He is alert and oriented to person, place, and time. Mental status is at baseline.  Psychiatric:        Mood and Affect: Mood normal.        Behavior: Behavior normal.        Thought Content: Thought content normal.        Judgment: Judgment normal.     BP 123/86 Comment: home reading  Pulse (!) 112 Comment: home reading  Temp 99.6 F (37.6 C) (Oral)   Ht '5\' 10"'  (1.778 m)   Wt 254 lb (115.2 kg)   BMI 36.45 kg/m  Wt Readings from Last 3 Encounters:  08/23/22 254 lb (115.2 kg)  07/30/22 254 lb (115.2 kg)  07/03/22 249 lb (112.9 kg)       Assessment & Plan:   Problem List Items Addressed This  Visit       Respiratory   Bronchitis - Primary    rx augmentin 875/125 mg po bid x 10 days Take antibiotic as prescribed. Increase oral fluids. Pt to f/u if sx worsen and or fail to improve in 2-3 days. Recommend otc mucinex without DM   Will hold off of steroid at current due to pt diabetic status  Recommended cxr if no improvement and or physical exam in office, will defer for now      Relevant Medications   amoxicillin-clavulanate (AUGMENTIN) 875-125 MG tablet     Other   Seasonal allergies    Recommend pt restart daily flonase 50 mcg and sent in refill for zyrtec 10 mg      Relevant Medications   cetirizine (ZYRTEC) 10 MG tablet    I have discontinued Joseph [name] "RICK"'s cetirizine. I am also having him start on amoxicillin-clavulanate and cetirizine. Additionally, I am having him maintain his tamsulosin, buPROPion, citalopram, fluticasone, Movantik, lansoprazole, gabapentin, glipiZIDE, methocarbamol, tadalafil, OVER THE COUNTER MEDICATION, OVER THE  COUNTER MEDICATION, Testosterone, hydrOXYzine, FreeStyle Libre 2 Sensor, albuterol, empagliflozin, atorvastatin, metFORMIN, [place] FlexTouch, Breo Ellipta, blood glucose meter kit and supplies, glucose blood, Lancets, Combivent Respimat, meloxicam, tapentadol, Nucynta ER, losartan, zolpidem, and Unifine Pentips.  Meds ordered this encounter  Medications   amoxicillin-clavulanate (AUGMENTIN) 875-125 MG tablet    Sig: Take 1 tablet by mouth 2 (two) times daily.    Dispense:  20 tablet    Refill:  0    Order Specific Question:   Supervising Provider    Answer:   BEDSOLE, AMY E [[id number]]   cetirizine (ZYRTEC) 10 MG tablet    Sig: Take 1 tablet (10 mg total) by mouth daily.    Dispense:  30 tablet    Refill:  11    Order Specific Question:   Supervising Provider    Answer:   Diona Browner, AMY E [[id number]]    I discussed the assessment and treatment plan with the patient. The patient was provided an opportunity to ask questions and all  were answered. The patient agreed with the plan and demonstrated an understanding of the instructions.   The patient was advised to call back or seek an in-person evaluation if the symptoms worsen or if the condition fails to improve as anticipated.  I provided 15 minutes of face-to-face time during this encounter.   Eugenia Pancoast, [place] [facility] [phone] (phone) [phone] (fax)  [place]

## 2022-08-26 ENCOUNTER — Encounter: Payer: Self-pay | Admitting: Family

## 2022-08-26 ENCOUNTER — Other Ambulatory Visit: Payer: Self-pay | Admitting: Family

## 2022-08-26 DIAGNOSIS — J029 Acute pharyngitis, unspecified: Secondary | ICD-10-CM

## 2022-08-26 MED ORDER — LIDOCAINE VISCOUS HCL 2 % MT SOLN: 5.0000 mL | Freq: Four times a day (QID) | OROMUCOSAL | 0 refills | Status: AC

## 2022-08-26 MED ORDER — LIDOCAINE VISCOUS HCL 2 % MT SOLN: 5.0000 mL | Freq: Four times a day (QID) | OROMUCOSAL | 0 refills | Status: DC

## 2022-09-06 ENCOUNTER — Other Ambulatory Visit: Payer: Self-pay | Admitting: Primary Care

## 2022-09-06 DIAGNOSIS — E1165 Type 2 diabetes mellitus with hyperglycemia: Secondary | ICD-10-CM

## 2022-10-08 ENCOUNTER — Telehealth: Payer: Self-pay

## 2022-10-08 NOTE — Progress Notes (Signed)
Chronic Care Management Pharmacy Assistant   Name: Joseph [name]  MRN: [id number] DOB: May 14, 1971  Reason for Encounter: CCM (Hypertension Disease State)  Recent office visits:  08/26/22 Due to throat pain Start: magic mouthwash (lidocaine, diphenhydrAMINE, alum & mag hydroxide) suspension  08/23/22 Eugenia Pancoast, NP Bronchitis Start: amoxicillin-clavulanate (AUGMENTIN) 875-125 MG tablet Change: cetirizine (ZYRTEC) 10 MG tablet 07/30/22 [name], NP HTN Change: tapentadol (NUCYNTA) 50 MG tablet and NUCYNTA ER 200 MG TB12. Stop: Oxycodone HCl 10 MG TABS    Recent consult visits:  None since last CCM contact  Hospital visits:  None in previous 6 months  Medications: Outpatient Encounter Medications as of 10/08/2022  Medication Sig   empagliflozin (JARDIANCE) 10 MG TABS tablet TAKE ONE TABLET (10MG TOTAL) BY MOUTH DAILY FOR DIABETES   albuterol (PROVENTIL) (2.5 MG/3ML) 0.083% nebulizer solution Take 3 mLs (2.5 mg total) by nebulization every 4 (four) hours as needed for wheezing or shortness of breath.   amoxicillin-clavulanate (AUGMENTIN) 875-125 MG tablet Take 1 tablet by mouth 2 (two) times daily.   atorvastatin (LIPITOR) 40 MG tablet TAKE ONE (1) TABLET BY MOUTH EVERY DAY FOR CHOLESTEROL   blood glucose meter kit and supplies KIT Dispense based on patient and insurance preference. Use up to four times daily as directed. (Dx code E11.65)   buPROPion (WELLBUTRIN SR) 150 MG 12 hr tablet Take 1 tablet (150 mg total) by mouth 2 (two) times daily. For anxiety and depression.   cetirizine (ZYRTEC) 10 MG tablet Take 1 tablet (10 mg total) by mouth daily.   citalopram (CELEXA) 40 MG tablet Take 1 tablet (40 mg total) by mouth daily. For anxiety and depression.   Continuous Blood Gluc Sensor (FREESTYLE LIBRE 2 SENSOR) MISC by Does not apply route. Apply sensor every 14 days to monitor sugar continuously. Via [facility]   fluticasone (FLONASE) 50 MCG/ACT nasal spray Place 2 sprays into  both nostrils daily as needed for allergies or rhinitis.   fluticasone furoate-vilanterol (BREO ELLIPTA) 200-25 MCG/ACT AEPB INHALE ONE PUFF INTO THE LUNGS DAILY   gabapentin (NEURONTIN) 300 MG capsule Take 2 capsules (600 mg total) by mouth 2 (two) times daily. For pain.   glipiZIDE (GLUCOTROL) 10 MG tablet Take 1 tablet (10 mg total) by mouth 2 (two) times daily before a meal. For diabetes.   glucose blood test strip Use as instructed to test blood sugar up to 4 times daily. (Dx code E11.65)   hydrOXYzine (ATARAX) 10 MG tablet Take 1-2 tablets (10-20 mg total) by mouth 2 (two) times daily as needed. For anxiety.   insulin degludec (TRESIBA FLEXTOUCH) 100 UNIT/ML FlexTouch Pen Inject 35 Units into the skin daily.   Ipratropium-Albuterol (COMBIVENT RESPIMAT) 20-100 MCG/ACT AERS respimat INHALE ONE PUFF INTO THE LUNGS EVERY FOUR HOURS AS NEEDED FOR WHEEZING   Lancets MISC Use as directed to check blood sugar up to 4 times daily. (Dx code E11.65)   lansoprazole (PREVACID) 30 MG capsule Take 1 capsule (30 mg total) by mouth 2 (two) times daily before a meal. For heartburn.   losartan (COZAAR) 50 MG tablet Take 1 tablet (50 mg total) by mouth daily. For blood pressure.   meloxicam (MOBIC) 15 MG tablet Take 15 mg by mouth daily as needed.   metFORMIN (GLUCOPHAGE-XR) 500 MG 24 hr tablet TAKE TWO TABLETS (1000MG TOTAL) BY MOUTHTWO TIMES DAILY WITH A MEAL FOR DIABETES   methocarbamol (ROBAXIN) 500 MG tablet Take 500 mg by mouth at bedtime.   MOVANTIK 25  MG TABS tablet Take 25 mg by mouth every morning.   NUCYNTA ER 200 MG TB12 Take 1 tablet by mouth 2 (two) times daily.   OVER THE COUNTER MEDICATION Take 2 tablets by mouth every morning. Osteo Biflex triple strength with turmeric   OVER THE COUNTER MEDICATION Take 1 tablet by mouth every morning. Neuriva   tadalafil (CIALIS) 20 MG tablet Take 20 mg by mouth daily as needed for erectile dysfunction.   tamsulosin (FLOMAX) 0.4 MG CAPS capsule TAKE ONE  CAPSULE BY MOUTH DAILY   tapentadol (NUCYNTA) 50 MG tablet    Testosterone 40.5 MG/2.5GM (1.62%) GEL Place 2.5 g onto the skin every morning. 2 pumps - 2.5 g   UNIFINE PENTIPS 31G X 6 MM MISC USE NIGHTLY WITH INSULIN   zolpidem (AMBIEN) 10 MG tablet TAKE ONE TABLET (10MG TOTAL) BY MOUTH ATBEDTIME AS NEEDED FOR SLEEP   No facility-administered encounter medications on file as of 10/08/2022.    Recent Office Vitals: BP Readings from Last 3 Encounters:  08/23/22 123/86  07/30/22 132/74  07/03/22 (!) 140/90   Pulse Readings from Last 3 Encounters:  08/23/22 (!) 112  07/30/22 (!) 110  07/03/22 (!) 104    Wt Readings from Last 3 Encounters:  08/23/22 254 lb (115.2 kg)  07/30/22 254 lb (115.2 kg)  07/03/22 249 lb (112.9 kg)     Kidney Function Lab Results  Component Value Date/Time   CREATININE 0.80 07/30/2022 11:40 AM   CREATININE 0.74 03/01/2022 10:15 PM   CREATININE 0.93 01/11/2022 02:38 PM   GFR 101.94 07/30/2022 11:40 AM   GFRNONAA >60 03/01/2022 10:15 PM   GFRAA >60 07/13/2018 03:04 PM       Latest Ref Rng & Units 07/30/2022   11:40 AM 03/01/2022   10:15 PM 01/30/2022    4:03 AM  BMP  Glucose 70 - 99 mg/dL 199  157  194   BUN 6 - 23 mg/dL _0 Creatinine 0.40 - 1.50 mg/dL 0.80  0.74  0.64   Sodium 135 - 145 mEq/L 138  135  136   Potassium 3.5 - 5.1 mEq/L 3.8  3.7  3.8   Chloride 96 - 112 mEq/L 101  100  100   CO2 19 - 32 mEq/L _1 Calcium 8.4 - 10.5 mg/dL 9.3  9.4  8.6     Attempted contact with patient 3 times. Unsuccessful outreach. Will atttempt contact next month.  Current antihypertensive regimen:  Losartan 50 mg daily    Adherence Review: Is the patient currently on ACE/ARB medication? Yes Does the patient have >5 day gap between last estimated fill dates? Yes - Atorvastatin 40 mg  Star Rating Drugs:  Medication:  Last Fill: Day Supply Atorvastatin 40 mg 06/17/22 90 - No refills Glipizide 10 mg 07/09/22 90 - No refills Losartan 50  mg 07/31/22 90 Metformin 500 mg 07/10/22 90 - Waiting pick up Verified with [place]   Summary of recommendations from last [place] visit (Date:07/19/22)  Summary: CCM F/U visit -HTN (new Dx): pt endorses compliance with losartan and reports BP at home has improved (128/88 today) -DM: A1c 7.1% (01/2022), pt reports he does not have any BG testing supplies, he does not have Part B right now so coverage has been an issue. He is the process of trying to get Part B coverage.   Recommendations/Changes made from today's visit: -Refilled glucometer/supplies to pharmacy; advised pt if cost is an  issue he can buy OTC [facility] brand supplies at [facility] for reduced price   Plan: -[place] will call patient 3 months for BP update -Pharmacist follow up televisit scheduled for 6 months -PCP F/U 07/24/22   Care Gaps: Annual wellness visit in last year? No Most Recent BP reading: 123/86 on 08/23/2022  If Diabetic: Most recent A1C reading: 7.1 on 07/03/2022 Last eye exam / retinopathy screening: 11/03/2017 Last diabetic foot exam: Up to date  Upcoming appointments: CCM appointment on 01/27/2023  Charlene Brooke, CPP notified  Marijean Niemann, [place] Assistant [phone]

## 2022-10-10 NOTE — Telephone Encounter (Signed)
Called and spoke with patient. He did an at home Covid test that came back positive last night. Last night he was having chills, excessive sweating, vomiting and headache. This morning he is experiencing a lot of fatigue, headache and congestion. Patient states he had a prescription for Percocet and has been taking that. Scheduled MyChart video visit with Dr. Ermalene Searing 11/10 @ 3:20

## 2022-10-11 ENCOUNTER — Other Ambulatory Visit: Payer: Self-pay | Admitting: Primary Care

## 2022-10-11 ENCOUNTER — Telehealth (INDEPENDENT_AMBULATORY_CARE_PROVIDER_SITE_OTHER): Payer: Self-pay | Admitting: Family Medicine

## 2022-10-11 ENCOUNTER — Encounter: Payer: Self-pay | Admitting: Family Medicine

## 2022-10-11 VITALS — Ht 70.0 in

## 2022-10-11 DIAGNOSIS — U071 COVID-19: Secondary | ICD-10-CM

## 2022-10-11 DIAGNOSIS — E119 Type 2 diabetes mellitus without complications: Secondary | ICD-10-CM

## 2022-10-11 MED ORDER — MOLNUPIRAVIR EUA 200MG CAPSULE: 4.0000 | ORAL_CAPSULE | Freq: Two times a day (BID) | ORAL | 0 refills | Status: AC

## 2022-10-11 NOTE — Progress Notes (Signed)
VIRTUAL VISIT A virtual visit is felt to be most appropriate for this patient at this time.   I connected with the patient on 10/11/22 at  3:20 PM EST by virtual telehealth platform and verified that I am speaking with the correct person using two identifiers.   I discussed the limitations, risks, security and privacy concerns of performing an evaluation and management service by  virtual telehealth platform and the availability of in person appointments. I also discussed with the patient that there may be a patient responsible charge related to this service. The patient expressed understanding and agreed to proceed.  Patient location: Home Provider Location: [facility] Participants: Eliezer Lofts and Arn Medal   Chief Complaint  Patient presents with   Covid Positive    Positive Home Test Wednesday Symptoms started Wednesday   Headache   Fatigue   Nausea   Chills        Cough   Nasal Congestion    History of Present Illness: 51 year old male patient of [name], NP with history of asthma/COPD, hypertension, sleep apnea, type 2 diabetes and obesity presents with new onset COVID infection.  Date of onset October 09, 2022 Initial symptoms started with chills, subjective fever and tremors.  Next started with nausea and vomiting... he has  now progressed to fatigue and productive cough.  Now with sinus pressure and congestion.  No SOB.  No current wheeze. Has controller and rescue med available.  He is treated with aspirin and prescription meds.   Family went on cruise and he was exposed to [place] who was sick with COVID.     See pain management  on Percocet then Nucynta. Recent multiple knee surgery.    No chronic lung disease, patient is a former smoker.      Review of Systems  Constitutional:  Positive for chills and fever.  HENT:  Positive for congestion. Negative for ear pain.   Eyes:  Negative for pain and redness.  Respiratory:  Positive  for cough. Negative for shortness of breath.   Cardiovascular:  Negative for chest pain, palpitations and leg swelling.  Gastrointestinal:  Negative for abdominal pain, blood in stool, constipation, diarrhea, nausea and vomiting.  Genitourinary:  Negative for dysuria.  Musculoskeletal:  Negative for falls and myalgias.  Skin:  Negative for rash.  Neurological:  Negative for dizziness.  Psychiatric/Behavioral:  Negative for depression. The patient is not nervous/anxious.       Past Medical History:  Diagnosis Date   Acute encephalopathy 03/24/2018   Anxiety    Arthritis    oa left knee and lower back   Asthma    Bucket handle tear of meniscus of knee (Right)    Cataract    hx of bilateral   CHF (congestive heart failure) (HCC)    Chronic back pain    Chronic pain of left knee    COPD (chronic obstructive pulmonary disease) ([place])    Diabetes mellitus    type 2   DVT (deep venous thrombosis) ([place]) 01/09/2022   GERD (gastroesophageal reflux disease)    INGUINAL PAIN, LEFT 10/03/2009   Qualifier: Diagnosis of  By: Oneida Alar MD, Sandi L    Medial meniscus, posterior horn derangement 04/22/2014   Oral thrush 11/10/2018   Osteomyelitis ([place]) 02/14/2022   Overdose    03-24-18 accidental   Rash and nonspecific skin eruption 03/12/2017   S/P knee surgery 04/26/2014   Surgery May 22 partial medial meniscectomy   Septic arthritis ([place]) 01/09/2022  Septic arthritis of knee ([place]) 01/25/2022   Septic arthritis of knee, right ([place]) 01/09/2022   Shortness of breath    with heavy exertion   Sleep apnea    Status post unicompartmental knee replacement, left 07/21/2018    reports that he has been smoking cigarettes. He started smoking about 28 years ago. He has a 52.00 pack-year smoking history. He has never used smokeless tobacco. He reports that he does not drink alcohol and does not use drugs.   Current Outpatient Medications:    albuterol (PROVENTIL) (2.5 MG/3ML) 0.083% nebulizer solution, Take  3 mLs (2.5 mg total) by nebulization every 4 (four) hours as needed for wheezing or shortness of breath., Disp: 360 mL, Rfl: 3   atorvastatin (LIPITOR) 40 MG tablet, TAKE ONE (1) TABLET BY MOUTH EVERY DAY FOR CHOLESTEROL, Disp: 90 tablet, Rfl: 0   blood glucose meter kit and supplies KIT, Dispense based on patient and insurance preference. Use up to four times daily as directed. (Dx code E11.65), Disp: 1 each, Rfl: 0   buPROPion (WELLBUTRIN SR) 150 MG 12 hr tablet, Take 1 tablet (150 mg total) by mouth 2 (two) times daily. For anxiety and depression., Disp: 180 tablet, Rfl: 3   cetirizine (ZYRTEC) 10 MG tablet, Take 1 tablet (10 mg total) by mouth daily., Disp: 30 tablet, Rfl: 11   citalopram (CELEXA) 40 MG tablet, Take 1 tablet (40 mg total) by mouth daily. For anxiety and depression., Disp: 90 tablet, Rfl: 3   Continuous Blood Gluc Sensor (FREESTYLE LIBRE 2 SENSOR) MISC, by Does not apply route. Apply sensor every 14 days to monitor sugar continuously. Via [facility], Disp: , Rfl:    empagliflozin (JARDIANCE) 10 MG TABS tablet, TAKE ONE TABLET (10MG TOTAL) BY MOUTH DAILY FOR DIABETES, Disp: 90 tablet, Rfl: 1   fluticasone (FLONASE) 50 MCG/ACT nasal spray, Place 2 sprays into both nostrils daily as needed for allergies or rhinitis., Disp: 48 g, Rfl: 3   fluticasone furoate-vilanterol (BREO ELLIPTA) 200-25 MCG/ACT AEPB, INHALE ONE PUFF INTO THE LUNGS DAILY, Disp: 180 each, Rfl: 3   gabapentin (NEURONTIN) 300 MG capsule, Take 2 capsules (600 mg total) by mouth 2 (two) times daily. For pain., Disp: 360 capsule, Rfl: 2   glipiZIDE (GLUCOTROL) 10 MG tablet, Take 1 tablet (10 mg total) by mouth 2 (two) times daily before a meal. For diabetes., Disp: 180 tablet, Rfl: 1   glucose blood test strip, Use as instructed to test blood sugar up to 4 times daily. (Dx code E11.65), Disp: 100 each, Rfl: 12   hydrOXYzine (ATARAX) 10 MG tablet, Take 1-2 tablets (10-20 mg total) by mouth 2 (two) times daily as needed. For  anxiety., Disp: 180 tablet, Rfl: 0   insulin degludec (TRESIBA FLEXTOUCH) 100 UNIT/ML FlexTouch Pen, Inject 35 Units into the skin daily., Disp: 30 mL, Rfl: 1   Ipratropium-Albuterol (COMBIVENT RESPIMAT) 20-100 MCG/ACT AERS respimat, INHALE ONE PUFF INTO THE LUNGS EVERY FOUR HOURS AS NEEDED FOR WHEEZING, Disp: 4 g, Rfl: 5   Lancets MISC, Use as directed to check blood sugar up to 4 times daily. (Dx code E11.65), Disp: 100 each, Rfl: 3   lansoprazole (PREVACID) 30 MG capsule, Take 1 capsule (30 mg total) by mouth 2 (two) times daily before a meal. For heartburn., Disp: 180 capsule, Rfl: 3   losartan (COZAAR) 50 MG tablet, Take 1 tablet (50 mg total) by mouth daily. For blood pressure., Disp: 90 tablet, Rfl: 3   meloxicam (MOBIC) 15 MG tablet,  Take 15 mg by mouth daily as needed., Disp: , Rfl:    metFORMIN (GLUCOPHAGE-XR) 500 MG 24 hr tablet, TAKE TWO TABLETS (1000MG TOTAL) BY MOUTHTWO TIMES DAILY WITH A MEAL FOR DIABETES, Disp: 360 tablet, Rfl: 1   methocarbamol (ROBAXIN) 500 MG tablet, Take 500 mg by mouth at bedtime., Disp: , Rfl:    MOVANTIK 25 MG TABS tablet, Take 25 mg by mouth every morning., Disp: , Rfl:    OVER THE COUNTER MEDICATION, Take 2 tablets by mouth every morning. Osteo Biflex triple strength with turmeric, Disp: , Rfl:    OVER THE COUNTER MEDICATION, Take 1 tablet by mouth every morning. Neuriva, Disp: , Rfl:    oxyCODONE-acetaminophen (PERCOCET) 10-325 MG tablet, , Disp: , Rfl:    tadalafil (CIALIS) 20 MG tablet, Take 20 mg by mouth daily as needed for erectile dysfunction., Disp: , Rfl:    tamsulosin (FLOMAX) 0.4 MG CAPS capsule, TAKE ONE CAPSULE BY MOUTH DAILY, Disp: 90 capsule, Rfl: 1   Testosterone 40.5 MG/2.5GM (1.62%) GEL, Place 2.5 g onto the skin every morning. 2 pumps - 2.5 g, Disp: , Rfl:    UNIFINE PENTIPS 31G X 6 MM MISC, USE NIGHTLY WITH INSULIN, Disp: 100 each, Rfl: 3   zolpidem (AMBIEN) 10 MG tablet, TAKE ONE TABLET (10MG TOTAL) BY MOUTH ATBEDTIME AS NEEDED FOR  SLEEP, Disp: 90 tablet, Rfl: 0   NUCYNTA 100 MG TABS, Take 1 tablet by mouth 4 (four) times daily. (Patient not taking: Reported on 10/11/2022), Disp: , Rfl:    Observations/Objective: Height 5' 10" (1.778 m).  Physical Exam  Body mass index is 36.45 kg/m.  Physical Exam Constitutional:      General: The patient is not in acute distress. Pulmonary:     Effort: Pulmonary effort is normal. No respiratory distress.  Neurological:     Mental Status: The patient is alert and oriented to person, place, and time.  Psychiatric:        Mood and Affect: Mood normal.        Behavior: Behavior normal.   Assessment and Plan Problem List Items Addressed This Visit     COVID-19 - Primary    COVID19  Infection < 5 days from onset of symptoms in  unvaccinated overweight individual.  No clear sign of bacterial infection at this time.   No SOB.  No red flags/need for ER visit or in-person exam at respiratory clinic at this time..    Pt higher risk for COVID complications given age Indication present to Paxlovid. Complete course of molnupiravir. Reviewed course of medication and side effect profile with patient in detail.   Symptomatic care with mucinex and cough suppressant at night. If SOB begins symptoms worsening.. have low threshold for in-person exam, if severe shortness of breath ER visit recommended.  Can monitor Oxygen saturation at home with home monitor if able to obtain.  Go to ER if O2 sat < 90% on room air.   Reviewed home care and provided information through MyChart.  Recommended quarantine 5 days isolation recommended. Return to work day 6 and wear mask for 4 more days to complete 10 days. Provided info about prevention of spread of COVID 19.       Meds ordered this encounter  Medications   molnupiravir EUA (LAGEVRIO) 200 mg CAPS capsule    Sig: Take 4 capsules (800 mg total) by mouth 2 (two) times daily for 5 days.    Dispense:  40 capsule    Refill:    0      I  discussed the assessment and treatment plan with the patient. The patient was provided an opportunity to ask questions and all were answered. The patient agreed with the plan and demonstrated an understanding of the instructions.   The patient was advised to call back or seek an in-person evaluation if the symptoms worsen or if the condition fails to improve as anticipated.     Eliezer Lofts, MD

## 2022-10-14 ENCOUNTER — Other Ambulatory Visit: Payer: Self-pay | Admitting: Primary Care

## 2022-10-14 DIAGNOSIS — G8929 Other chronic pain: Secondary | ICD-10-CM

## 2022-10-14 DIAGNOSIS — G894 Chronic pain syndrome: Secondary | ICD-10-CM

## 2022-10-14 NOTE — Telephone Encounter (Signed)
Joseph [name], please call patient.  Need to get him in sometime in the early new year for follow up of all conditions. Can we get him scheduled? I wasn't sure what his insurance situation was yet.

## 2022-10-15 NOTE — Telephone Encounter (Signed)
Unable to reach patient. Left voicemail to return call to our office to schedule a follow up for early next year.

## 2022-10-15 NOTE — Telephone Encounter (Signed)
Patient has been scheduled

## 2022-10-21 ENCOUNTER — Other Ambulatory Visit: Payer: Self-pay | Admitting: Internal Medicine

## 2022-10-26 NOTE — Assessment & Plan Note (Signed)
COVID19  Infection < 5 days from onset of symptoms in  unvaccinated overweight individual.  No clear sign of bacterial infection at this time.   No SOB.  No red flags/need for ER visit or in-person exam at respiratory clinic at this time..    Pt higher risk for COVID complications given age Indication present to Paxlovid. Complete course of molnupiravir. Reviewed course of medication and side effect profile with patient in detail.   Symptomatic care with mucinex and cough suppressant at night. If SOB begins symptoms worsening.. have low threshold for in-person exam, if severe shortness of breath ER visit recommended.  Can monitor Oxygen saturation at home with home monitor if able to obtain.  Go to ER if O2 sat < 90% on room air.   Reviewed home care and provided information through MyChart.  Recommended quarantine 5 days isolation recommended. Return to work day 6 and wear mask for 4 more days to complete 10 days. Provided info about prevention of spread of COVID 19.

## 2022-11-06 ENCOUNTER — Telehealth: Payer: Self-pay

## 2022-11-06 NOTE — Progress Notes (Signed)
Chronic Care Management Pharmacy Assistant   Name: Joseph [name]  MRN: [id number] DOB: [birth year]  Reason for Encounter: CCM (Hypertension Disease State)  Recent office visits:  10/11/22 Eliezer Lofts, MD Covid-19 Change: Tapentadol 100 mg Stop (completed): amoxicillin-clavulanate (AUGMENTIN) 875-125 MG tablet 08/26/22 Due to throat pain Start: magic mouthwash (lidocaine, diphenhydrAMINE, alum & mag hydroxide) suspension  08/23/22 Eugenia Pancoast, NP Bronchitis Start: amoxicillin-clavulanate (AUGMENTIN) 875-125 MG tablet Change: cetirizine (ZYRTEC) 10 MG tablet 07/30/22 [name], NP HTN Change: tapentadol (NUCYNTA) 50 MG tablet and NUCYNTA ER 200 MG TB12. Stop: Oxycodone HCl 10 MG TABS    Recent consult visits:  None since last CCM contact   Hospital visits:  None in previous 6 months   Medications: Outpatient Encounter Medications as of 11/06/2022  Medication Sig   albuterol (PROVENTIL) (2.5 MG/3ML) 0.083% nebulizer solution USE ONE VIAL IN NEBULIZER EVERY FOUR HOURS AS NEEDED FOR WHEEZING OR SHORTNESS OF BREATH   atorvastatin (LIPITOR) 40 MG tablet TAKE ONE (1) TABLET BY MOUTH EVERY DAY FOR CHOLESTEROL   blood glucose meter kit and supplies KIT Dispense based on patient and insurance preference. Use up to four times daily as directed. (Dx code E11.65)   buPROPion (WELLBUTRIN SR) 150 MG 12 hr tablet Take 1 tablet (150 mg total) by mouth 2 (two) times daily. For anxiety and depression.   cetirizine (ZYRTEC) 10 MG tablet Take 1 tablet (10 mg total) by mouth daily.   citalopram (CELEXA) 40 MG tablet Take 1 tablet (40 mg total) by mouth daily. For anxiety and depression.   Continuous Blood Gluc Sensor (FREESTYLE LIBRE 2 SENSOR) MISC by Does not apply route. Apply sensor every 14 days to monitor sugar continuously. Via [facility]   empagliflozin (JARDIANCE) 10 MG TABS tablet TAKE ONE TABLET (10MG TOTAL) BY MOUTH DAILY FOR DIABETES   fluticasone (FLONASE) 50 MCG/ACT nasal spray Place 2  sprays into both nostrils daily as needed for allergies or rhinitis.   fluticasone furoate-vilanterol (BREO ELLIPTA) 200-25 MCG/ACT AEPB INHALE ONE PUFF INTO THE LUNGS DAILY   gabapentin (NEURONTIN) 300 MG capsule TAKE 2 CAPSULES BY MOUTH TWICE A DAY.   glipiZIDE (GLUCOTROL) 10 MG tablet TAKE ONE TABLET (10 MG TOTAL) BY MOUTH TWO TIMES DAILY BEFORE A MEAL FOR DIABETES.   glucose blood test strip Use as instructed to test blood sugar up to 4 times daily. (Dx code E11.65)   hydrOXYzine (ATARAX) 10 MG tablet Take 1-2 tablets (10-20 mg total) by mouth 2 (two) times daily as needed. For anxiety.   insulin degludec (TRESIBA FLEXTOUCH) 100 UNIT/ML FlexTouch Pen Inject 35 Units into the skin daily.   Ipratropium-Albuterol (COMBIVENT RESPIMAT) 20-100 MCG/ACT AERS respimat INHALE ONE PUFF INTO THE LUNGS EVERY FOUR HOURS AS NEEDED FOR WHEEZING   Lancets MISC Use as directed to check blood sugar up to 4 times daily. (Dx code E11.65)   lansoprazole (PREVACID) 30 MG capsule Take 1 capsule (30 mg total) by mouth 2 (two) times daily before a meal. For heartburn.   losartan (COZAAR) 50 MG tablet Take 1 tablet (50 mg total) by mouth daily. For blood pressure.   meloxicam (MOBIC) 15 MG tablet Take 15 mg by mouth daily as needed.   metFORMIN (GLUCOPHAGE-XR) 500 MG 24 hr tablet TAKE TWO TABLETS (1000MG TOTAL) BY MOUTHTWO TIMES DAILY WITH A MEAL FOR DIABETES   methocarbamol (ROBAXIN) 500 MG tablet Take 500 mg by mouth at bedtime.   MOVANTIK 25 MG TABS tablet Take 25 mg by mouth every  morning.   NUCYNTA 100 MG TABS Take 1 tablet by mouth 4 (four) times daily. (Patient not taking: Reported on 10/11/2022)   OVER THE COUNTER MEDICATION Take 2 tablets by mouth every morning. Osteo Biflex triple strength with turmeric   OVER THE COUNTER MEDICATION Take 1 tablet by mouth every morning. Neuriva   oxyCODONE-acetaminophen (PERCOCET) 10-325 MG tablet    tadalafil (CIALIS) 20 MG tablet Take 20 mg by mouth daily as needed for  erectile dysfunction.   tamsulosin (FLOMAX) 0.4 MG CAPS capsule TAKE ONE CAPSULE BY MOUTH DAILY   Testosterone 40.5 MG/2.5GM (1.62%) GEL Place 2.5 g onto the skin every morning. 2 pumps - 2.5 g   UNIFINE PENTIPS 31G X 6 MM MISC USE NIGHTLY WITH INSULIN   zolpidem (AMBIEN) 10 MG tablet TAKE ONE TABLET (10MG TOTAL) BY MOUTH ATBEDTIME AS NEEDED FOR SLEEP   No facility-administered encounter medications on file as of 11/06/2022.    Recent Office Vitals: BP Readings from Last 3 Encounters:  08/23/22 123/86  07/30/22 132/74  07/03/22 (!) 140/90   Pulse Readings from Last 3 Encounters:  08/23/22 (!) 112  07/30/22 (!) 110  07/03/22 (!) 104    Wt Readings from Last 3 Encounters:  08/23/22 254 lb (115.2 kg)  07/30/22 254 lb (115.2 kg)  07/03/22 249 lb (112.9 kg)     Kidney Function Lab Results  Component Value Date/Time   CREATININE 0.80 07/30/2022 11:40 AM   CREATININE 0.74 03/01/2022 10:15 PM   CREATININE 0.93 01/11/2022 02:38 PM   GFR 101.94 07/30/2022 11:40 AM   GFRNONAA >60 03/01/2022 10:15 PM   GFRAA >60 07/13/2018 03:04 PM       Latest Ref Rng & Units 07/30/2022   11:40 AM 03/01/2022   10:15 PM 01/30/2022    4:03 AM  BMP  Glucose 70 - 99 mg/dL 199  157  194   BUN 6 - 23 mg/dL _0 Creatinine 0.40 - 1.50 mg/dL 0.80  0.74  0.64   Sodium 135 - 145 mEq/L 138  135  136   Potassium 3.5 - 5.1 mEq/L 3.8  3.7  3.8   Chloride 96 - 112 mEq/L 101  100  100   CO2 19 - 32 mEq/L _1 Calcium 8.4 - 10.5 mg/dL 9.3  9.4  8.6      Attempted contact with patient 3 times. Unsuccessful outreach. Will atttempt contact next month.  Current antihypertensive regimen:  Losartan 50 mg daily    Adherence Review: Is the patient currently on ACE/ARB medication? No Does the patient have >5 day gap between last estimated fill dates? Yes - Atorvastatin 40 mg  Star Rating Drugs:  Medication:                Last Fill:         Day Supply Atorvastatin 40 mg 06/17/22 90 Glipizide 10  mg 10/11/22 90  Losartan 50 mg 11/01/22 90 Metformin 500 mg       10/08/22 90   Care Gaps: Annual wellness visit in last year? No Most Recent BP reading: 123/86 on 08/23/2022  If Diabetic: Most recent A1C reading: 7.1 on 07/03/2022 Last eye exam / retinopathy screening: 11/03/2017 Last diabetic foot exam: 10/23/2021  Summary of recommendations from last [place] visit (Date:07/19/2022)  Summary: CCM F/U visit -HTN (new Dx): pt endorses compliance with losartan and reports BP at home has improved (128/88 today) -DM: A1c 7.1% (01/2022),  pt reports he does not have any BG testing supplies, he does not have Part B right now so coverage has been an issue. He is the process of trying to get Part B coverage.   Recommendations/Changes made from today's visit: -Refilled glucometer/supplies to pharmacy; advised pt if cost is an issue he can buy OTC [employer] brand supplies at [facility] for reduced price   Plan: -[facility] will call patient 3 months for BP update -Pharmacist follow up televisit scheduled for 6 months -PCP F/U 07/24/22  Charlene Brooke, CPP notified  Marijean Niemann, [place] Assistant [phone]

## 2022-11-07 ENCOUNTER — Other Ambulatory Visit: Payer: Self-pay | Admitting: Primary Care

## 2022-11-07 DIAGNOSIS — G4700 Insomnia, unspecified: Secondary | ICD-10-CM

## 2022-11-07 DIAGNOSIS — G8929 Other chronic pain: Secondary | ICD-10-CM

## 2022-11-26 ENCOUNTER — Other Ambulatory Visit: Payer: Self-pay | Admitting: Primary Care

## 2022-11-26 DIAGNOSIS — F32A Depression, unspecified: Secondary | ICD-10-CM

## 2022-11-26 DIAGNOSIS — E1165 Type 2 diabetes mellitus with hyperglycemia: Secondary | ICD-10-CM

## 2022-12-10 ENCOUNTER — Ambulatory Visit: Payer: Self-pay | Admitting: Primary Care

## 2022-12-17 DIAGNOSIS — N400 Enlarged prostate without lower urinary tract symptoms: Secondary | ICD-10-CM | POA: Insufficient documentation

## 2022-12-23 ENCOUNTER — Other Ambulatory Visit: Payer: Self-pay | Admitting: Primary Care

## 2022-12-23 DIAGNOSIS — E785 Hyperlipidemia, unspecified: Secondary | ICD-10-CM

## 2022-12-23 DIAGNOSIS — F32A Depression, unspecified: Secondary | ICD-10-CM

## 2022-12-23 NOTE — Telephone Encounter (Signed)
Patient is due for general follow up. Please schedule, thank you!

## 2022-12-24 ENCOUNTER — Other Ambulatory Visit: Payer: Self-pay | Admitting: Primary Care

## 2022-12-24 DIAGNOSIS — K219 Gastro-esophageal reflux disease without esophagitis: Secondary | ICD-10-CM

## 2022-12-24 NOTE — Telephone Encounter (Signed)
Lvmtcb, sent mychart message  

## 2023-01-09 ENCOUNTER — Encounter: Payer: Self-pay | Admitting: Pulmonary Disease

## 2023-01-09 ENCOUNTER — Other Ambulatory Visit: Payer: Self-pay | Admitting: Primary Care

## 2023-01-09 DIAGNOSIS — E119 Type 2 diabetes mellitus without complications: Secondary | ICD-10-CM

## 2023-01-10 ENCOUNTER — Encounter: Payer: Self-pay | Admitting: Pulmonary Disease

## 2023-01-10 ENCOUNTER — Encounter: Payer: Self-pay | Admitting: Primary Care

## 2023-01-10 ENCOUNTER — Ambulatory Visit (INDEPENDENT_AMBULATORY_CARE_PROVIDER_SITE_OTHER): Payer: Medicare Other | Admitting: Primary Care

## 2023-01-10 ENCOUNTER — Telehealth: Payer: Self-pay

## 2023-01-10 VITALS — BP 132/84 | HR 88 | Temp 98.2°F | Ht 70.0 in | Wt 251.0 lb

## 2023-01-10 DIAGNOSIS — Z01818 Encounter for other preprocedural examination: Secondary | ICD-10-CM

## 2023-01-10 DIAGNOSIS — Z794 Long term (current) use of insulin: Secondary | ICD-10-CM

## 2023-01-10 DIAGNOSIS — T402X5A Adverse effect of other opioids, initial encounter: Secondary | ICD-10-CM

## 2023-01-10 DIAGNOSIS — E1165 Type 2 diabetes mellitus with hyperglycemia: Secondary | ICD-10-CM | POA: Diagnosis not present

## 2023-01-10 DIAGNOSIS — G894 Chronic pain syndrome: Secondary | ICD-10-CM

## 2023-01-10 DIAGNOSIS — J449 Chronic obstructive pulmonary disease, unspecified: Secondary | ICD-10-CM

## 2023-01-10 DIAGNOSIS — E291 Testicular hypofunction: Secondary | ICD-10-CM

## 2023-01-10 DIAGNOSIS — F32A Depression, unspecified: Secondary | ICD-10-CM

## 2023-01-10 DIAGNOSIS — I10 Essential (primary) hypertension: Secondary | ICD-10-CM

## 2023-01-10 DIAGNOSIS — Z1211 Encounter for screening for malignant neoplasm of colon: Secondary | ICD-10-CM | POA: Diagnosis not present

## 2023-01-10 DIAGNOSIS — Z125 Encounter for screening for malignant neoplasm of prostate: Secondary | ICD-10-CM | POA: Diagnosis not present

## 2023-01-10 DIAGNOSIS — K219 Gastro-esophageal reflux disease without esophagitis: Secondary | ICD-10-CM

## 2023-01-10 DIAGNOSIS — M1711 Unilateral primary osteoarthritis, right knee: Secondary | ICD-10-CM

## 2023-01-10 DIAGNOSIS — E785 Hyperlipidemia, unspecified: Secondary | ICD-10-CM

## 2023-01-10 DIAGNOSIS — G4700 Insomnia, unspecified: Secondary | ICD-10-CM

## 2023-01-10 DIAGNOSIS — F419 Anxiety disorder, unspecified: Secondary | ICD-10-CM

## 2023-01-10 DIAGNOSIS — K5903 Drug induced constipation: Secondary | ICD-10-CM

## 2023-01-10 LAB — POCT GLYCOSYLATED HEMOGLOBIN (HGB A1C): Hemoglobin A1C: 7.4 % — AB (ref 4.0–5.6)

## 2023-01-10 NOTE — Patient Instructions (Addendum)
Stop by the lab prior to leaving today. I will notify you of your results once received.   You will either be contacted via phone regarding your referral to GI for the colonoscopy, or you may receive a letter on your MyChart portal from our referral team with instructions for scheduling an appointment. Please let us know if you have not been contacted by anyone within two weeks.  Please schedule a follow up visit for 6 months for a diabetes check.  It was a pleasure to see you today!

## 2023-01-10 NOTE — Assessment & Plan Note (Signed)
Controlled. Continue lansoprazole 30 mg 2 times daily.

## 2023-01-10 NOTE — Assessment & Plan Note (Signed)
Following with pulmonology, office notes reviewed from June 2023.   Continue Breo inhaler daily, Respimat inhaler as needed, albuterol inhaler as needed.

## 2023-01-10 NOTE — Telephone Encounter (Signed)
Joseph [name] placed a DME order for [name] for this patient. Im unsure of who I need to send the info to. Please advise.

## 2023-01-10 NOTE — Assessment & Plan Note (Signed)
Overall stable with A1c of 7.4 today. Would like to see lower, discussed this with patient today.  He will work on his diet and start exercising after his surgery.  Continue glipizide 10 mg twice daily, Jardiance 10 mg daily, metformin XR 1000 mg twice daily, Tresiba 35 units daily

## 2023-01-10 NOTE — Assessment & Plan Note (Signed)
Overall controlled.  Continue citalopram 40 mg daily, bupropion SR 150 mg twice daily, zolpidem 10 mg at bedtime, hydroxyzine 10 mg as needed.

## 2023-01-10 NOTE — Assessment & Plan Note (Signed)
Following with urology.  Continue testosterone gel daily, tadalafil 20 mg daily as needed.

## 2023-01-10 NOTE — Assessment & Plan Note (Signed)
Repeat lipid panel pending.  Continue atorvastatin 40 mg daily.

## 2023-01-10 NOTE — Assessment & Plan Note (Signed)
Pending right total knee replacement per Dr. Percell Miller.  Labs pending today.  EKG with normal sinus rhythm, rate of 84, no PAC/PVCs, no acute ST changes.  Appears similar to EKG from 2023.  Await results. Should be able to clear.

## 2023-01-10 NOTE — Progress Notes (Signed)
Subjective:    Patient ID: Joseph [name], male    DOB: [birth year], 52 y.o.   MRN: [id number]  HPI  Joseph [name] is a very pleasant 52 y.o. male with a history of type 2 diabetes, male hypogonadism, COPD, OSA, chronic pain syndrome, insomnia, hyperlipidemia, anxiety depression, osteoarthritis who presents today for follow-up of chronic conditions.  He is also needing preoperative clearance. He is pending right total knee replacement per DR. Percell Miller.   1) Type 2 Diabetes:  Current medications include: Glipizide 10 mg twice daily, metformin XR 1000 mg twice daily, Tresiba 35 units daily, Jardiance 10 mg daily  He is checking his blood glucose 0 times daily.  Last A1C: 7.1 in August 2023, pending today Last Eye Exam: Due Last Foot Exam: Due Pneumonia Vaccination: 2019 Urine Microalbumin: Due Statin: Atorvastatin  Dietary changes since last visit: No changes.    Exercise: None  2) Hypertension/Hyperlipidemia: Currently managed on atorvastatin 40 mg daily, losartan 50 mg daily.  He denies chest pain, shortness of breath.   BP Readings from Last 3 Encounters:  01/10/23 132/84  08/23/22 123/86  07/30/22 132/74    3) Anxiety/Depression: Currently managed on bupropion SR 150 mg twice daily, citalopram 40 mg daily, hydroxyzine 10 mg as needed.  Also managed on zolpidem 10 mg nightly for which he has taken for numerous years.  Overall he feels well managed on this regimen. He denies SI/HI.   4) Osteoarthritis/Chronic Pain: Currently managed on Nucynta 100 mg daily, methocarbamol 500 mg daily as needed, gabapentin 600 mg twice daily.  Following with pain management, will be starting with [facility]. Also managed on Movantik for opioid induced constipation.   5) OSA/COPD/Asthma: Following with pulmonology, last office visit was in June 2023.  Managed on Breo Ellipta daily, Combivent Respimat 20-100 mcg inhaler as needed, albuterol inhaler as needed, albuterol nebulizer  treatments as needed.  6) Erectile Dysfunction/Hypogonadism: Currently managed on tamsulosin 0.4 mg daily, testosterone gel 2.5 mg topically daily, tadalafil 20 mg daily as needed.  Following with Urology.   Review of Systems  Constitutional:  Negative for unexpected weight change.  HENT:  Negative for rhinorrhea.   Respiratory:  Negative for cough and shortness of breath.   Cardiovascular:  Negative for chest pain.  Gastrointestinal:  Positive for constipation. Negative for diarrhea.  Genitourinary:  Negative for difficulty urinating.  Musculoskeletal:  Positive for arthralgias.  Skin:  Negative for rash.  Allergic/Immunologic: Negative for environmental allergies.  Neurological:  Negative for dizziness and headaches.  Psychiatric/Behavioral:  The patient is not nervous/anxious.          Past Medical History:  Diagnosis Date   Acute encephalopathy 03/24/2018   Anxiety    Arthritis    oa left knee and lower back   Asthma    Bucket handle tear of meniscus of knee (Right)    Cataract    hx of bilateral   CHF (congestive heart failure) (HCC)    Chronic back pain    Chronic pain of left knee    COPD (chronic obstructive pulmonary disease) ([place])    COVID-19 10/31/2021   Diabetes mellitus    type 2   DVT (deep venous thrombosis) ([place]) 01/09/2022   GERD (gastroesophageal reflux disease)    INGUINAL PAIN, LEFT 10/03/2009   Qualifier: Diagnosis of  By: Oneida Alar MD, Sandi L    Medial meniscus, posterior horn derangement 04/22/2014   Oral thrush 11/10/2018   Osteomyelitis ([place]) 02/14/2022   Overdose  03-24-18 accidental   Rash and nonspecific skin eruption 03/12/2017   S/P knee surgery 04/26/2014   Surgery May 22 partial medial meniscectomy   Septic arthritis ([place]) 01/09/2022   Septic arthritis of knee ([place]) 01/25/2022   Septic arthritis of knee, right ([place]) 01/09/2022   Serratia marcescens infection 02/14/2022   Shortness of breath    with heavy exertion   Sleep apnea     Status post unicompartmental knee replacement, left 07/21/2018    Social History   Socioeconomic History   Marital status: Divorced    Spouse name: Not on file   Number of children: Not on file   Years of education: Not on file   Highest education level: Not on file  Occupational History   Occupation: disability  Tobacco Use   Smoking status: Every Day    Packs/day: 2.00    Years: 26.00    Total pack years: 52.00    Types: Cigarettes    Start date: 12/17/1993   Smokeless tobacco: Never   Tobacco comments:    States 2 ppd, wants to quit but has had 3 surgeries in 2 months  Vaping Use   Vaping Use: Former  Substance and Sexual Activity   Alcohol use: Never    Alcohol/week: 0.0 standard drinks of alcohol   Drug use: Never   Sexual activity: Yes    Birth control/protection: Surgical  Other Topics Concern   Not on file  Social History Narrative   Recent visit to [facility] in [place] d/t increased pain where he was prescribed percocet 7.5-325 mg for pain.   Social Determinants of Health   Financial Resource Strain: Medium Risk (04/17/2022)   Overall Financial Resource Strain (CARDIA)    Difficulty of Paying Living Expenses: Somewhat hard  Food Insecurity: No Food Insecurity (04/17/2022)   Hunger Vital Sign    Worried About Running Out of Food in the Last Year: Never true    Ran Out of Food in the Last Year: Never true  Transportation Needs: Not on file  Physical Activity: Not on file  Stress: Not on file  Social Connections: Not on file  Intimate Partner Violence: Not on file    Past Surgical History:  Procedure Laterality Date   CARPAL TUNNEL RELEASE Left    CATARACT EXTRACTION W/PHACO Right 03/11/2016   Procedure: CATARACT EXTRACTION PHACO AND INTRAOCULAR LENS PLACEMENT ([place]);  Surgeon: Tonny Branch, MD;  Location: [facility];  Service: Ophthalmology;  Laterality: Right;  CDE 4.24   EYE SURGERY Right 2018   ioc with lens replacement   HERNIA REPAIR Bilateral [id number]    umbilical   INCISION AND DRAINAGE ABSCESS Right 01/02/2022   Procedure: RIGHT KNEE ARTHROSCOPIC IRRIGATION AND DEBRIDEMENT;  Surgeon: Willaim Sheng, MD;  Location: [place];  Service: Orthopedics;  Laterality: Right;   INCISION AND DRAINAGE ABSCESS Right 01/26/2022   Procedure: INCISION AND DRAINAGE ABSCESS POST OP RIGHT KNEE ARTHROSCOPY;  Surgeon: Willaim Sheng, MD;  Location: [place];  Service: Orthopedics;  Laterality: Right;   KNEE ARTHROSCOPY WITH MEDIAL MENISECTOMY Left 04/22/2014   Procedure: LEFT KNEE ARTHROSCOPY WITH MEDIAL MENISECTOMY;  Surgeon: Carole Civil, MD;  Location: [facility];  Service: Orthopedics;  Laterality: Left;   KNEE ARTHROSCOPY WITH MEDIAL MENISECTOMY Right 12/06/2015   Procedure: RIGHT KNEE ARTHROSCOPY WITH MEDIAL MENISECTOMY;  Surgeon: Carole Civil, MD;  Location: [facility];  Service: Orthopedics;  Laterality: Right;   KNEE ARTHROSCOPY WITH MEDIAL MENISECTOMY Left 03/20/2017   Procedure: KNEE ARTHROSCOPY  WITH MEDIAL MENISECTOMY;  Surgeon: Carole Civil, MD;  Location: [facility];  Service: Orthopedics;  Laterality: Left;   ORIF WRIST FRACTURE Left 12/19/2016   Procedure: OPEN REDUCTION INTERNAL FIXATION (ORIF) WRIST FRACTURE;  Surgeon: Leanora Cover, MD;  Location: [place];  Service: Orthopedics;  Laterality: Left;  accumed    PARTIAL KNEE ARTHROPLASTY Left 07/21/2018   Procedure: LEFT UNICOMPARTMENTAL KNEE;  Surgeon: Renette Butters, MD;  Location: [facility];  Service: Orthopedics;  Laterality: Left;   SHOULDER ARTHROCENTESIS Right 2008 or 2010   VASECTOMY  2014    Family History  Problem Relation Age of Onset   Emphysema Maternal Grandfather    Emphysema Maternal Grandmother    Clotting disorder Maternal Grandmother     Allergies  Allergen Reactions   Azithromycin Hives    01/27/22 - pt states that he had a Zpak a couple years ago with no reaction   Clarithromycin Hives   Erythromycin Hives   Keflex [Cephalexin]  Nausea Only   Metformin Nausea And Vomiting   Symbicort [Budesonide-Formoterol Fumarate] Other (See Comments)    States that 3 doses were used and breathing became worse    Current Outpatient Medications on File Prior to Visit  Medication Sig Dispense Refill   albuterol (PROVENTIL) (2.5 MG/3ML) 0.083% nebulizer solution USE ONE VIAL IN NEBULIZER EVERY FOUR HOURS AS NEEDED FOR WHEEZING OR SHORTNESS OF BREATH 360 mL 3   atorvastatin (LIPITOR) 40 MG tablet TAKE ONE (1) TABLET BY MOUTH EVERY DAY FOR CHOLESTEROL 90 tablet 0   blood glucose meter kit and supplies KIT Dispense based on patient and insurance preference. Use up to four times daily as directed. (Dx code E11.65) 1 each 0   buPROPion (WELLBUTRIN SR) 150 MG 12 hr tablet TAKE ONE TABLET (150MG TOTAL) BY MOUTH TWO TIMES DAILY FOR ANXIETY AND DEPRESSION 180 tablet 0   cetirizine (ZYRTEC) 10 MG tablet Take 1 tablet (10 mg total) by mouth daily. 30 tablet 11   citalopram (CELEXA) 40 MG tablet TAKE ONE TABLET (40MG TOTAL) BY MOUTH DAILY FOR ANXIETY AND DEPRESSION 90 tablet 0   Continuous Blood Gluc Sensor (FREESTYLE LIBRE 2 SENSOR) MISC by Does not apply route. Apply sensor every 14 days to monitor sugar continuously. Via [facility]     empagliflozin (JARDIANCE) 10 MG TABS tablet TAKE ONE TABLET (10MG TOTAL) BY MOUTH DAILY FOR DIABETES 90 tablet 1   fluticasone (FLONASE) 50 MCG/ACT nasal spray Place 2 sprays into both nostrils daily as needed for allergies or rhinitis. 48 g 3   fluticasone furoate-vilanterol (BREO ELLIPTA) 200-25 MCG/ACT AEPB INHALE ONE PUFF INTO THE LUNGS DAILY 180 each 3   gabapentin (NEURONTIN) 300 MG capsule TAKE 2 CAPSULES BY MOUTH TWICE A DAY. 360 capsule 0   glipiZIDE (GLUCOTROL) 10 MG tablet TAKE ONE TABLET (10 MG TOTAL) BY MOUTH TWO TIMES DAILY BEFORE A MEAL FOR DIABETES. 180 tablet 0   glucose blood test strip Use as instructed to test blood sugar up to 4 times daily. (Dx code E11.65) 100 each 12   hydrOXYzine (ATARAX)  10 MG tablet Take 1-2 tablets (10-20 mg total) by mouth 2 (two) times daily as needed. For anxiety. 180 tablet 0   insulin degludec (TRESIBA FLEXTOUCH) 100 UNIT/ML FlexTouch Pen INJECT 35 UNITS INTO THE SKIN DAILY FOR DIABETES 30 mL 0   Ipratropium-Albuterol (COMBIVENT RESPIMAT) 20-100 MCG/ACT AERS respimat INHALE ONE PUFF INTO THE LUNGS EVERY FOUR HOURS AS NEEDED FOR WHEEZING 4 g 5  Lancets MISC Use as directed to check blood sugar up to 4 times daily. (Dx code E11.65) 100 each 3   lansoprazole (PREVACID) 30 MG capsule TAKE ONE CAPSULE (30MG TOTAL) BY MOUTH TWO TIMES DAILY BEFORE A MEAL 180 capsule 0   losartan (COZAAR) 50 MG tablet Take 1 tablet (50 mg total) by mouth daily. For blood pressure. 90 tablet 3   meloxicam (MOBIC) 15 MG tablet Take 15 mg by mouth daily as needed.     metFORMIN (GLUCOPHAGE-XR) 500 MG 24 hr tablet TAKE TWO TABLETS (1000MG TOTAL) BY MOUTHTWO TIMES DAILY WITH A MEAL FOR DIABETES 360 tablet 1   methocarbamol (ROBAXIN) 500 MG tablet Take 500 mg by mouth at bedtime.     MOVANTIK 25 MG TABS tablet Take 25 mg by mouth every morning.     NUCYNTA 100 MG TABS Take 1 tablet by mouth 4 (four) times daily.     OVER THE COUNTER MEDICATION Take 2 tablets by mouth every morning. Osteo Biflex triple strength with turmeric     OVER THE COUNTER MEDICATION Take 1 tablet by mouth every morning. Neuriva     tadalafil (CIALIS) 20 MG tablet Take 20 mg by mouth daily as needed for erectile dysfunction.     tamsulosin (FLOMAX) 0.4 MG CAPS capsule TAKE ONE CAPSULE BY MOUTH DAILY 90 capsule 1   Testosterone 40.5 MG/2.5GM (1.62%) GEL Place 2.5 g onto the skin every morning. 2 pumps - 2.5 g     UNIFINE PENTIPS 31G X 6 MM MISC USE NIGHTLY WITH INSULIN 100 each 3   zolpidem (AMBIEN) 10 MG tablet TAKE ONE TABLET (10MG TOTAL) BY MOUTH ATBEDTIME AS NEEDED FOR SLEEP 90 tablet 0   oxyCODONE-acetaminophen (PERCOCET) 10-325 MG tablet  (Patient not taking: Reported on 01/10/2023)     No current  facility-administered medications on file prior to visit.    BP 132/84   Pulse 88   Temp 98.2 F (36.8 C) (Temporal)   Ht 5' 10"$  (1.778 m)   Wt 251 lb (113.9 kg)   SpO2 97%   BMI 36.01 kg/m  Objective:   Physical Exam HENT:     Right Ear: Tympanic membrane and ear canal normal.     Left Ear: Tympanic membrane and ear canal normal.     Nose: Nose normal.     Right Sinus: No maxillary sinus tenderness or frontal sinus tenderness.     Left Sinus: No maxillary sinus tenderness or frontal sinus tenderness.  Eyes:     Conjunctiva/sclera: Conjunctivae normal.  Neck:     Thyroid: No thyromegaly.     Vascular: No carotid bruit.  Cardiovascular:     Rate and Rhythm: Normal rate and regular rhythm.     Heart sounds: Normal heart sounds.  Pulmonary:     Effort: Pulmonary effort is normal.     Breath sounds: Normal breath sounds. No wheezing or rales.  Abdominal:     General: Bowel sounds are normal.     Palpations: Abdomen is soft.     Tenderness: There is no abdominal tenderness.  Musculoskeletal:        General: Normal range of motion.     Cervical back: Neck supple.  Skin:    General: Skin is warm and dry.  Neurological:     Mental Status: He is alert and oriented to person, place, and time.     Cranial Nerves: No cranial nerve deficit.     Deep Tendon Reflexes: Reflexes are normal and symmetric.  Psychiatric:        Mood and Affect: Mood normal.           Assessment & Plan:  Type 2 diabetes mellitus with hyperglycemia, with long-term current use of insulin (HCC) Assessment & Plan: Overall stable with A1c of 7.4 today. Would like to see lower, discussed this with patient today.  He will work on his diet and start exercising after his surgery.  Continue glipizide 10 mg twice daily, Jardiance 10 mg daily, metformin XR 1000 mg twice daily, Tresiba 35 units daily  Orders: -     Microalbumin / creatinine urine ratio -     POCT glycosylated hemoglobin (Hb A1C) -      For home use only DME Other see comment  Screening for colon cancer -     Ambulatory referral to Gastroenterology  Hyperlipidemia, unspecified hyperlipidemia type -     Lipid panel -     Comprehensive metabolic panel -     CBC  Screening for prostate cancer -     PSA, Medicare  Preoperative clearance Assessment & Plan: Pending right total knee replacement per Dr. Percell Miller.  Labs pending today.  EKG with normal sinus rhythm, rate of 84, no PAC/PVCs, no acute ST changes.  Appears similar to EKG from 2023.  Await results. Should be able to clear.  Orders: -     EKG 12-Lead  Essential hypertension Assessment & Plan: Controlled.   Continue losartan 50 mg daily.  CMP pending.   COPD without exacerbation [facility]) Assessment & Plan: Following with pulmonology, office notes reviewed from June 2023.   Continue Breo inhaler daily, Respimat inhaler as needed, albuterol inhaler as needed.    Gastroesophageal reflux disease, unspecified whether esophagitis present Assessment & Plan: Controlled. Continue lansoprazole 30 mg 2 times daily.   Opioid-induced constipation (OIC) Assessment & Plan: Following with pain management.  Continue Movantik 25 mg daily   Male hypogonadism Assessment & Plan: Following with urology.  Continue testosterone gel daily, tadalafil 20 mg daily as needed.   Primary osteoarthritis of right knee Assessment & Plan: Pending right total knee replacement per Dr. Percell Miller Preoperative clearance completed today.   Anxiety and depression Assessment & Plan: Overall controlled.  Continue citalopram 40 mg daily, bupropion SR 150 mg twice daily, zolpidem 10 mg at bedtime, hydroxyzine 10 mg as needed.   Chronic pain syndrome Assessment & Plan: Following with pain management.  Continue Nucynta at 100 mg daily, gabapentin 6 mg twice daily, methocarbamol 500 mg at bedtime as needed.   Dyslipidemia Assessment & Plan: Repeat lipid panel  pending.  Continue atorvastatin 40 mg daily.    Insomnia, unspecified type Assessment & Plan: Controlled.  Continue Zolpidem 10 mg daily.          Pleas Koch, NP

## 2023-01-10 NOTE — Assessment & Plan Note (Signed)
Following with pain management.  Continue Nucynta at 100 mg daily, gabapentin 6 mg twice daily, methocarbamol 500 mg at bedtime as needed.

## 2023-01-10 NOTE — Addendum Note (Signed)
Addended by: Ellamae Sia on: 01/10/2023 02:28 PM   Modules accepted: Orders

## 2023-01-10 NOTE — Assessment & Plan Note (Signed)
Controlled.  Continue Zolpidem 10 mg daily.

## 2023-01-10 NOTE — Assessment & Plan Note (Signed)
Following with pain management.  Continue Movantik 25 mg daily

## 2023-01-10 NOTE — Assessment & Plan Note (Signed)
Controlled.   Continue losartan 50 mg daily.  CMP pending.

## 2023-01-10 NOTE — Assessment & Plan Note (Signed)
Pending right total knee replacement per Dr. Percell Miller Preoperative clearance completed today.

## 2023-01-11 LAB — LIPID PANEL
Cholesterol: 129 mg/dL (ref <200)
HDL: 33 mg/dL — ABNORMAL LOW (ref >=40)
LDL Cholesterol (Calc): 65 mg/dL (calc)
Non-HDL Cholesterol (Calc): 96 mg/dL (calc) (ref <130)
Total CHOL/HDL Ratio: 3.9 (calc) (ref <5.0)
Triglycerides: 263 mg/dL — ABNORMAL HIGH (ref <150)

## 2023-01-11 LAB — PSA: PSA: 0.6 ng/mL (ref <=4.00)

## 2023-01-11 LAB — COMPREHENSIVE METABOLIC PANEL
AG Ratio: 1.8 (calc) (ref 1.0–2.5)
ALT: 24 U/L (ref 9–46)
AST: 17 U/L (ref 10–35)
Albumin: 4.2 g/dL (ref 3.6–5.1)
Alkaline phosphatase (APISO): 82 U/L (ref 35–144)
BUN/Creatinine Ratio: 7 (calc) (ref 6–22)
BUN: 6 mg/dL — ABNORMAL LOW (ref 7–25)
CO2: 25 mmol/L (ref 20–32)
Calcium: 9 mg/dL (ref 8.6–10.3)
Chloride: 103 mmol/L (ref 98–110)
Creat: 0.81 mg/dL (ref 0.70–1.30)
Globulin: 2.3 g/dL (calc) (ref 1.9–3.7)
Glucose, Bld: 211 mg/dL — ABNORMAL HIGH (ref 65–99)
Potassium: 4 mmol/L (ref 3.5–5.3)
Sodium: 139 mmol/L (ref 135–146)
Total Bilirubin: 0.4 mg/dL (ref 0.2–1.2)
Total Protein: 6.5 g/dL (ref 6.1–8.1)

## 2023-01-11 LAB — CBC
HCT: 40.7 % (ref 38.5–50.0)
Hemoglobin: 14 g/dL (ref 13.2–17.1)
MCH: 29.4 pg (ref 27.0–33.0)
MCHC: 34.4 g/dL (ref 32.0–36.0)
MCV: 85.5 fL (ref 80.0–100.0)
MPV: 10.6 fL (ref 7.5–12.5)
Platelets: 240 10*3/uL (ref 140–400)
RBC: 4.76 10*6/uL (ref 4.20–5.80)
RDW: 13.7 % (ref 11.0–15.0)
WBC: 9.9 10*3/uL (ref 3.8–10.8)

## 2023-01-11 LAB — MICROALBUMIN / CREATININE URINE RATIO
Creatinine, Urine: 19 mg/dL — ABNORMAL LOW (ref 20–320)
Microalb, Ur: <0.2 mg/dL

## 2023-01-13 NOTE — Telephone Encounter (Signed)
Called and notified patient.

## 2023-01-13 NOTE — Telephone Encounter (Addendum)
I submitted order for Freestyle Libre 3 sensors to [facility] [place] [facility] through [employer].  Patient will get a phone call in a few days from [place] to discuss insurance copay (if there is one) and delivery of sensors. Patient can also call [facility] directly to complete order [phone]).

## 2023-01-14 ENCOUNTER — Other Ambulatory Visit: Payer: Self-pay | Admitting: Primary Care

## 2023-01-14 ENCOUNTER — Encounter: Payer: Self-pay | Admitting: *Deleted

## 2023-01-14 DIAGNOSIS — G8929 Other chronic pain: Secondary | ICD-10-CM

## 2023-01-14 DIAGNOSIS — G894 Chronic pain syndrome: Secondary | ICD-10-CM

## 2023-01-27 ENCOUNTER — Telehealth: Payer: Self-pay

## 2023-01-28 ENCOUNTER — Other Ambulatory Visit (HOSPITAL_COMMUNITY): Payer: Self-pay

## 2023-01-28 ENCOUNTER — Encounter: Payer: Self-pay | Admitting: Pulmonary Disease

## 2023-02-06 ENCOUNTER — Telehealth: Payer: Self-pay

## 2023-02-06 DIAGNOSIS — G4700 Insomnia, unspecified: Secondary | ICD-10-CM

## 2023-02-06 DIAGNOSIS — G8929 Other chronic pain: Secondary | ICD-10-CM

## 2023-02-06 MED ORDER — ZOLPIDEM TARTRATE 10 MG PO TABS: 10.0000 mg | ORAL_TABLET | Freq: Every evening | ORAL | 0 refills | Status: DC | PRN

## 2023-02-06 NOTE — Telephone Encounter (Signed)
Received refill request for

## 2023-02-06 NOTE — Addendum Note (Signed)
Addended by: Pleas Koch on: 02/06/2023 02:12 PM   Modules accepted: Orders

## 2023-02-12 ENCOUNTER — Telehealth: Payer: Self-pay

## 2023-02-12 NOTE — Telephone Encounter (Signed)
Called and ft voicemail  for patient to call office back in regards to surgical clearance

## 2023-02-17 NOTE — Telephone Encounter (Signed)
Called and left voicemail for patient to call office back for office visit for surgical clearance with Dr Melvyn Novas or APP.

## 2023-02-25 ENCOUNTER — Telehealth: Payer: Self-pay

## 2023-02-25 NOTE — Telephone Encounter (Signed)
Patient has been contacted several times in regards to apt for Surgical Clearance. Patient has not attempted to call our office back for a apt. I am sending pulmonary authorization back to Dr. Debroah Loop office advising them to re-send request if patient plans to continue with surgery. Nothing further needed at this time. Closing encounter

## 2023-02-25 NOTE — Telephone Encounter (Signed)
ATC X1 LVM for patient to call the office back to scheduled apt for Surgical Clearance. If patient calls back please schedule with Dr. Melvyn Novas or NP. If patient is still having knee replacement

## 2023-02-28 ENCOUNTER — Other Ambulatory Visit: Payer: Self-pay | Admitting: Primary Care

## 2023-02-28 DIAGNOSIS — F419 Anxiety disorder, unspecified: Secondary | ICD-10-CM

## 2023-02-28 DIAGNOSIS — F32A Depression, unspecified: Secondary | ICD-10-CM

## 2023-03-15 ENCOUNTER — Other Ambulatory Visit: Payer: Self-pay | Admitting: Primary Care

## 2023-03-15 DIAGNOSIS — E1165 Type 2 diabetes mellitus with hyperglycemia: Secondary | ICD-10-CM

## 2023-03-25 ENCOUNTER — Other Ambulatory Visit: Payer: Self-pay | Admitting: Primary Care

## 2023-03-25 DIAGNOSIS — E785 Hyperlipidemia, unspecified: Secondary | ICD-10-CM

## 2023-03-25 DIAGNOSIS — F32A Depression, unspecified: Secondary | ICD-10-CM

## 2023-03-25 DIAGNOSIS — K219 Gastro-esophageal reflux disease without esophagitis: Secondary | ICD-10-CM

## 2023-03-28 ENCOUNTER — Other Ambulatory Visit: Payer: Self-pay | Admitting: Primary Care

## 2023-03-28 ENCOUNTER — Other Ambulatory Visit: Payer: Self-pay | Admitting: Adult Health

## 2023-03-28 DIAGNOSIS — Z794 Long term (current) use of insulin: Secondary | ICD-10-CM

## 2023-03-28 MED ORDER — EMPAGLIFLOZIN 10 MG PO TABS: ORAL_TABLET | ORAL | 0 refills | Status: DC

## 2023-04-21 ENCOUNTER — Telehealth: Payer: Self-pay | Admitting: Orthopedic Surgery

## 2023-04-21 NOTE — Telephone Encounter (Signed)
Returned the patient's call, he wants to see Dr. Romeo Apple for his right knee.  He stated he is currently being see by [facility], but he's getting irritated w/them.  I asked him to get his notes and xrays together, drop them off here and we'll have Dr. Romeo Apple to review and advise.  Pt verbalized understanding.

## 2023-04-25 ENCOUNTER — Other Ambulatory Visit: Payer: Self-pay | Admitting: Internal Medicine

## 2023-05-01 ENCOUNTER — Other Ambulatory Visit: Payer: Self-pay | Admitting: Primary Care

## 2023-05-01 DIAGNOSIS — G8929 Other chronic pain: Secondary | ICD-10-CM

## 2023-05-01 DIAGNOSIS — G4700 Insomnia, unspecified: Secondary | ICD-10-CM

## 2023-05-14 ENCOUNTER — Ambulatory Visit (INDEPENDENT_AMBULATORY_CARE_PROVIDER_SITE_OTHER): Payer: Medicare Other | Admitting: Orthopedic Surgery

## 2023-05-14 ENCOUNTER — Encounter: Payer: Self-pay | Admitting: Orthopedic Surgery

## 2023-05-14 VITALS — BP 158/95 | HR 96 | Ht 70.0 in | Wt 252.5 lb

## 2023-05-14 DIAGNOSIS — E119 Type 2 diabetes mellitus without complications: Secondary | ICD-10-CM | POA: Diagnosis not present

## 2023-05-14 DIAGNOSIS — M25561 Pain in right knee: Secondary | ICD-10-CM

## 2023-05-14 DIAGNOSIS — Z79891 Long term (current) use of opiate analgesic: Secondary | ICD-10-CM

## 2023-05-14 DIAGNOSIS — F17210 Nicotine dependence, cigarettes, uncomplicated: Secondary | ICD-10-CM

## 2023-05-14 DIAGNOSIS — Z9889 Other specified postprocedural states: Secondary | ICD-10-CM | POA: Diagnosis not present

## 2023-05-14 DIAGNOSIS — Z794 Long term (current) use of insulin: Secondary | ICD-10-CM

## 2023-05-14 DIAGNOSIS — M009 Pyogenic arthritis, unspecified: Secondary | ICD-10-CM

## 2023-05-14 DIAGNOSIS — E1165 Type 2 diabetes mellitus with hyperglycemia: Secondary | ICD-10-CM

## 2023-05-14 NOTE — Progress Notes (Signed)
2nd opinion  Chief Complaint  Patient presents with   Knee Pain    R/ it has been hurting for about 1.5 years. Here to see what can be done.   This is a 52 year old male who presents for second opinion regarding the pain he is having in his right knee.  He had an arthroscopy of his right knee January 02, 2022 and then again in January 26, 2019.  He developed a DVT after his knee surgery which had to be treated; he now has severe pain in his right knee and is now under pain management for opioid therapy.  He has significant dysfunction as he has lost his job.  He requires a cane to walk most time.  He has several episodes where he knee feels like the knee will give way.  The pain is excruciating.  His x-rays show varus alignment to his right knee with grade 4 arthritis  Review of systems Review of Systems  Constitutional:  Negative for chills, fever, malaise/fatigue and weight loss.  Musculoskeletal:  Negative for back pain.  Skin:  Negative for itching and rash.       No redness or erythema or warmth to the right knee joint  Psychiatric/Behavioral:  Positive for suicidal ideas.     BP (!) 158/95   Pulse 96   Ht 5\' 10"  (1.778 m)   Wt 252 lb 8 oz (114.5 kg)   BMI 36.23 kg/m   Physical Exam Vitals and nursing note reviewed.  Constitutional:      General: He is not in acute distress.    Appearance: Normal appearance. He is not ill-appearing, toxic-appearing or diaphoretic.  HENT:     Head: Normocephalic and atraumatic.  Eyes:     General: No scleral icterus.       Right eye: No discharge.        Left eye: No discharge.     Extraocular Movements: Extraocular movements intact.     Conjunctiva/sclera: Conjunctivae normal.     Pupils: Pupils are equal, round, and reactive to light.  Cardiovascular:     Rate and Rhythm: Normal rate.     Pulses: Normal pulses.  Pulmonary:     Effort: Pulmonary effort is normal.  Skin:    General: Skin is warm and dry.     Capillary Refill:  Capillary refill takes less than 2 seconds.     Findings: No erythema.  Neurological:     General: No focal deficit present.     Mental Status: He is alert and oriented to person, place, and time.     Sensory: No sensory deficit.     Motor: No weakness.     Coordination: Coordination normal.     Gait: Gait abnormal.  Psychiatric:        Mood and Affect: Mood normal.        Behavior: Behavior normal.        Thought Content: Thought content normal.        Judgment: Judgment normal.    Examination of the right knee shows there is a just off the midline incision anteriorly from the arthrotomy the portal so the arthroscopy sites look normal  Surprisingly he has excellent range of motion in his knee with full extension and 125 degrees of flexion.  He has some anterior posterior instability with the knee set at its normal position this appears to be ACL type laxity  There is also some mild valgus laxity at 30 degrees  There  is no effusion.  There is no warmth or redness to the knee.  There is tenderness along the medial and lateral joint line and around the patella which is stable  Imaging studies show varus position of the knee with medial compartment bone-on-bone changes minimal osteophytes  Assessment and plan  Encounter Diagnoses  Name Primary?   S/P right knee arthroscopy x 2 Yes   Pyogenic arthritis of right knee joint, due to unspecified organism (HCC)    Long term prescription opiate use    Cigarette smoker    Type 2 diabetes mellitus with hyperglycemia, with long-term current use of insulin (HCC)      History of septic arthritis Status post right knee arthroscopy x 2 Status post DVT  He is 52 years old his knee pain is disabling he is on chronic opioid therapy to try to control the pain he has no ability to work in this condition and a knee replacement may help him.  However, he has to be willing to take the risk of Above-the-knee amputation (recent data shows 6 times  increased risk of recurrent infection with knee replacement after septic arthritis) Recurrent infection and multiple surgeries Persistent knee pain Excess opiate requirements after surgery to control knee pain  Other confounding variables include smoking history obesity and diabetes  He also needs dental work done.  I told him to ask the health department to let him know when the dentist do free dental care Past Medical History:  Diagnosis Date   Acute encephalopathy 03/24/2018   Anxiety    Arthritis    oa left knee and lower back   Asthma    Bucket handle tear of meniscus of knee (Right)    Cataract    hx of bilateral   CHF (congestive heart failure) (HCC)    Chronic back pain    Chronic pain of left knee    COPD (chronic obstructive pulmonary disease) (HCC)    COVID-19 10/31/2021   Diabetes mellitus    type 2   DVT (deep venous thrombosis) (HCC) 01/09/2022   GERD (gastroesophageal reflux disease)    INGUINAL PAIN, LEFT 10/03/2009   Qualifier: Diagnosis of  By: Darrick Penna MD, Sandi L    Medial meniscus, posterior horn derangement 04/22/2014   Oral thrush 11/10/2018   Osteomyelitis (HCC) 02/14/2022   Overdose    03-24-18 accidental   Rash and nonspecific skin eruption 03/12/2017   S/P knee surgery 04/26/2014   Surgery May 22 partial medial meniscectomy   Septic arthritis (HCC) 01/09/2022   Septic arthritis of knee (HCC) 01/25/2022   Septic arthritis of knee, right (HCC) 01/09/2022   Serratia marcescens infection 02/14/2022   Shortness of breath    with heavy exertion   Sleep apnea    Status post unicompartmental knee replacement, left 07/21/2018   Past Surgical History:  Procedure Laterality Date   CARPAL TUNNEL RELEASE Left    CATARACT EXTRACTION W/PHACO Right 03/11/2016   Procedure: CATARACT EXTRACTION PHACO AND INTRAOCULAR LENS PLACEMENT (IOC);  Surgeon: Gemma Payor, MD;  Location: [facility];  Service: Ophthalmology;  Laterality: Right;  CDE 4.24   EYE SURGERY Right 2018    ioc with lens replacement   HERNIA REPAIR Bilateral 2010   umbilical   INCISION AND DRAINAGE ABSCESS Right 01/02/2022   Procedure: RIGHT KNEE ARTHROSCOPIC IRRIGATION AND DEBRIDEMENT;  Surgeon: Joen Laura, MD;  Location: [facility];  Service: Orthopedics;  Laterality: Right;   INCISION AND DRAINAGE ABSCESS Right 01/26/2022  Procedure: INCISION AND DRAINAGE ABSCESS POST OP RIGHT KNEE ARTHROSCOPY;  Surgeon: Joen Laura, MD;  Location: [facility];  Service: Orthopedics;  Laterality: Right;   KNEE ARTHROSCOPY WITH MEDIAL MENISECTOMY Left 04/22/2014   Procedure: LEFT KNEE ARTHROSCOPY WITH MEDIAL MENISECTOMY;  Surgeon: Vickki Hearing, MD;  Location: [facility];  Service: Orthopedics;  Laterality: Left;   KNEE ARTHROSCOPY WITH MEDIAL MENISECTOMY Right 12/06/2015   Procedure: RIGHT KNEE ARTHROSCOPY WITH MEDIAL MENISECTOMY;  Surgeon: Vickki Hearing, MD;  Location: [facility];  Service: Orthopedics;  Laterality: Right;   KNEE ARTHROSCOPY WITH MEDIAL MENISECTOMY Left 03/20/2017   Procedure: KNEE ARTHROSCOPY WITH MEDIAL MENISECTOMY;  Surgeon: Vickki Hearing, MD;  Location: [facility];  Service: Orthopedics;  Laterality: Left;   ORIF WRIST FRACTURE Left 12/19/2016   Procedure: OPEN REDUCTION INTERNAL FIXATION (ORIF) WRIST FRACTURE;  Surgeon: Betha Loa, MD;  Location: [facility];  Service: Orthopedics;  Laterality: Left;  accumed    PARTIAL KNEE ARTHROPLASTY Left 07/21/2018   Procedure: LEFT UNICOMPARTMENTAL KNEE;  Surgeon: Sheral Apley, MD;  Location: [facility];  Service: Orthopedics;  Laterality: Left;   SHOULDER ARTHROCENTESIS Right 2008 or 2010   VASECTOMY  2014   Current Outpatient Medications  Medication Instructions   albuterol (PROVENTIL) (2.5 MG/3ML) 0.083% nebulizer solution USE ONE VIAL IN NEBULIZER EVERY FOUR HOURS AS NEEDED FOR WHEEZING OR SHORTNESS OF BREATH   atorvastatin (LIPITOR) 40 MG tablet TAKE ONE (1) TABLET BY MOUTH EVERY DAY FOR CHOLESTEROL    blood glucose meter kit and supplies KIT Dispense based on patient and insurance preference. Use up to four times daily as directed. (Dx code E11.65)   buPROPion (WELLBUTRIN SR) 150 MG 12 hr tablet TAKE ONE TABLET (150MG  TOTAL) BY MOUTH TWO TIMES DAILY FOR ANXIETY AND DEPRESSION   cetirizine (ZYRTEC) 10 mg, Oral, Daily   citalopram (CELEXA) 40 MG tablet TAKE ONE TABLET (40MG  TOTAL) BY MOUTH DAILY FOR ANXIETY AND DEPRESSION   Continuous Blood Gluc Sensor (FREESTYLE LIBRE 2 SENSOR) MISC Does not apply, Apply sensor every 14 days to monitor sugar continuously. Via [facility]   empagliflozin (JARDIANCE) 10 MG TABS tablet TAKE ONE TABLET (10MG  TOTAL) BY MOUTH DAILY FOR DIABETES   fluticasone furoate-vilanterol (BREO ELLIPTA) 200-25 MCG/ACT AEPB INHALE ONE PUFF INTO THE LUNGS DAILY   gabapentin (NEURONTIN) 600 mg, Oral, 2 times daily   glipiZIDE (GLUCOTROL) 10 MG tablet TAKE ONE TABLET (10 MG TOTAL) BY MOUTH TWO TIMES DAILY BEFORE A MEAL FOR DIABETES.   glucose blood test strip Use as instructed to test blood sugar up to 4 times daily. (Dx code E11.65)   hydrOXYzine (ATARAX) 10-20 mg, Oral, 2 times daily PRN, For anxiety.   insulin degludec (TRESIBA FLEXTOUCH) 100 UNIT/ML FlexTouch Pen INJECT 35 UNITS INTO THE SKIN DAILY FOR DIABETES   Ipratropium-Albuterol (COMBIVENT RESPIMAT) 20-100 MCG/ACT AERS respimat INHALE ONE PUFF INTO THE LUNGS EVERY FOUR HOURS AS NEEDED FOR WHEEZING   Lancets MISC Use as directed to check blood sugar up to 4 times daily. (Dx code E11.65)   lansoprazole (PREVACID) 30 MG capsule TAKE ONE CAPSULE (30MG  TOTAL) BY MOUTH TWO TIMES DAILY BEFORE A MEAL   losartan (COZAAR) 50 mg, Oral, Daily, For blood pressure.   metFORMIN (GLUCOPHAGE-XR) 500 MG 24 hr tablet TAKE TWO TABLETS (1000MG  TOTAL) BY MOUTHTWO TIMES DAILY WITH A MEAL FOR DIABETES   methocarbamol (ROBAXIN) 500 mg, Oral, Daily at bedtime   Movantik 25 mg, Oral, Every morning   NUCYNTA 100 MG TABS  1 tablet, Oral, 4 times daily    OVER THE COUNTER MEDICATION 2 tablets, Oral, Every morning, Osteo Biflex triple strength with turmeric   OVER THE COUNTER MEDICATION 1 tablet, Oral, Every morning, Neuriva   tadalafil (CIALIS) 20 mg, Oral, Daily PRN   tamsulosin (FLOMAX) 0.4 MG CAPS capsule TAKE ONE CAPSULE BY MOUTH DAILY   Testosterone 2.5 g, Transdermal, Every morning, 2 pumps - 2.5 g   UNIFINE PENTIPS 31G X 6 MM MISC USE NIGHTLY WITH INSULIN   zolpidem (AMBIEN) 10 MG tablet TAKE ONE TABLET (10MG  TOTAL) BY MOUTH ATBEDTIME AS NEEDED FOR SLEEP

## 2023-06-12 ENCOUNTER — Telehealth: Payer: Self-pay | Admitting: Internal Medicine

## 2023-06-12 ENCOUNTER — Encounter: Payer: Self-pay | Admitting: Pulmonary Disease

## 2023-06-12 MED ORDER — ALBUTEROL SULFATE (2.5 MG/3ML) 0.083% IN NEBU: 2.5000 mg | INHALATION_SOLUTION | RESPIRATORY_TRACT | 0 refills | Status: DC | PRN

## 2023-06-12 NOTE — Telephone Encounter (Signed)
[facility] on [address] in [place]  PT needs neb medication, Albuterol, knows he is overdue for appt but req courtesy refill.  His  # [phone]

## 2023-06-12 NOTE — Telephone Encounter (Signed)
Refilled x 1 only  LMOM that he needs to keep appt for refills

## 2023-06-13 ENCOUNTER — Telehealth: Payer: Self-pay | Admitting: Internal Medicine

## 2023-06-13 NOTE — Telephone Encounter (Signed)
Albuterol RX needs to go to the [facility] on [address]

## 2023-06-16 ENCOUNTER — Other Ambulatory Visit (HOSPITAL_COMMUNITY): Payer: Self-pay

## 2023-06-16 ENCOUNTER — Telehealth: Payer: Self-pay

## 2023-06-16 MED ORDER — ALBUTEROL SULFATE (2.5 MG/3ML) 0.083% IN NEBU: 2.5000 mg | INHALATION_SOLUTION | RESPIRATORY_TRACT | 0 refills | Status: DC | PRN

## 2023-06-16 NOTE — Telephone Encounter (Signed)
 Rx sent to pharmacy   

## 2023-06-16 NOTE — Telephone Encounter (Signed)
*  Pulm  PA request received for Albuterol Sulfate (2.5 MG/3ML)0.083% nebulizer solution  PA submitted to [facility] Medicare via CMM and is pending additional questions/determination  Key: [id number]

## 2023-06-17 NOTE — Telephone Encounter (Signed)
PA for Medicare Part D has been DENIED:   Denied. ALBUTEROL SULFATE Nebu Soln is used in a nebulizer. A nebulizer is a piece of durable medical equipment (DME). Drugs used with DME in the home are covered under Medicare Part B. Our records show that you do not live in a long term care (LTC) facility. We cannot pay for drugs under Medicare Part D if they are covered under Medicare Part A or B. We did not decide whether ALBUTEROL SULFATE Nebu Soln is medically necessary. We made our decision only on the fact that we cannot pay for the drug under Medicare Part D. For more information, talk to your prescriber or call 1- 800-MEDICARE.

## 2023-06-20 ENCOUNTER — Encounter: Payer: Medicare Other | Admitting: Primary Care

## 2023-06-23 ENCOUNTER — Other Ambulatory Visit: Payer: Self-pay

## 2023-06-23 DIAGNOSIS — J45909 Unspecified asthma, uncomplicated: Secondary | ICD-10-CM

## 2023-06-23 MED ORDER — ALBUTEROL SULFATE (2.5 MG/3ML) 0.083% IN NEBU: 2.5000 mg | INHALATION_SOLUTION | RESPIRATORY_TRACT | 2 refills | Status: DC | PRN

## 2023-06-24 ENCOUNTER — Other Ambulatory Visit: Payer: Self-pay | Admitting: Primary Care

## 2023-06-24 DIAGNOSIS — Z794 Long term (current) use of insulin: Secondary | ICD-10-CM

## 2023-06-30 NOTE — Progress Notes (Unsigned)
Subjective:   Patient ID: Joseph [name], male    DOB: 03-16-1971   MRN: [id number]    Brief patient profile:  55 yowm active smoker without significant airflow obst by PFTs 06/02/12 and 03/08/2015    History of Present Illness  In April 2013 cc  had pneumonia apparently. Went to [facility] ER and found some abnormality (nodule based on review of ER notes) on CXR that resulted in CT Angio 03/07/12 same visit. PE ruled out but found to have mediastinal nodes but the cxr nodule was not there. Apparently ER doctor said it was lymphoma and asked to see an oncologist. But primary care doctor felt that was over zealous and referred patient here. He prefers conservative mgmt to nodes if clinical suspicion for cancer is low  cc lack of energy (spent most of days yesterday in bed), esp in pollen, chronic dyspnea on exertion for 180 feet relieved by rest  But slowly progressive since 2002 for which he uses albuterol prn on average 4 times a day.  Walkt test 185 feet x 3 laps: no desaturation  He smokes at baseline; trying quit, prior intolerance (dreams) with chantix.   Only baseline mild chronic cough with associtaed wheeze that is occasional.. Does have some B symptoms like nocturnal diaphoresis (soaks pillows). Denies weight loss.   CT ANGIOGRAPHY CHEST  Comparison: 03/07/2012  An abnormal right lower paratracheal node has a short axis diameter  of 1.6 cm. An abnormal AP window lymph node has a short axis  diameter of 1.4 cm. Mild bilateral hilar and infrahilar adenopathy  noted. Scattered small axillary lymph nodes are present.  A subcarinal node has a short axis diameter of 1.5 cm.     OV 08/14/2012 Mediastinal nodes: PET scan 06/10/12 shows very low uptake in the mediastinal nodes. Suspicion for cancer is very low. Lymph nodes include 13 mm right lower paratracheal lymph node and 10 mm prevascular lymph node. ACE level 04/10/12  - is in 20s and normal  Still dyspneic: 1 flught of steps and better  with rest and with albuterol and atrovent prn. Dyspne also worse in winter and cooler weathers. Rates it as moderate. STable since last visit to slightly worse. thre is some associtaed cough. No asociated weight loss. COugh is moderate and worst early in morning and in cooler temperatues.. Denies sinus drainage. PFts show mixed picture - fvc 3.4L/69%, fev1 2.9/76%, Ratio 85 but 12% bd respose. TLC 5.7/83%. DLCO 23.7/69% rec rec #MEdiastinal node  - suspicion for cancer is low  - you will need CT chest in a year; we will schedule it later  #Smoking  - glad you are working on quitting  #Shortness of breath - this is due to asthma/copd and weight  - please start QVAR 2 puff twice daily - take sample and show technique and take prescription too  - focus on weight loss through the low glycemic diet; take diet sheet from [place]  #FOllowup  - 3 months with spirometry at followup  - flu shot and pneumovax through PMD as discussed   12/17/2013  acute ov/Joseph [name] still active smoker re: recurrent pattern of coughing x a decade intermittent / out of control since nov 2014/ can't take symbicort makes it worse / on percocet one tid at baseline  Chief Complaint  Patient presents with   Acute Visit    MR pt.  C/o mostly nonprod cough with some thick white mucous, coughing until he almost passes out.  Went to [employer]  [place] last week.    duoneb daily  Sometime skips the doses later in the day but always uses the am dose, sputum to purulent. Mostly sob with cough/ otherwise breaths ok Last neb was 4 h prior to OV   rec For Cough - use the flutter valve as much as you can plus mucinex dm 1200 mg every 12 hours as needed supplement with percocet up to 2 every 4hours For Breathing  Only use your duoneb  rescue medication . - Ok to use up to    every 4 hours if you must  Prednisone 10 mg take  4 each am x 2 days,   2 each am x 2 days,  1 each am x 2 days and stop  Pantoprazole (protonix) 40 mg   Take 30-60 min  before first meal of the day and Pepcid 20 mg one bedtime until return to office - this is the best way to tell whether stomach acid is contributing to your problem.  GERD  Diet . Please schedule a follow up office visit in 2 weeks, sooner if needed with all meds in hand     10/03/2015  f/u ov/Joseph [name] re: still smoking /  ? asthma vs all vcd/ gerd  Chief Complaint  Patient presents with   Follow-up    Pt states his breathing is overall doing well. He is using rescue inhaler and neb several times per day.   Wife left him in July 2016 and losing wt, dm better, breathing not since stopped dulera and never went for mct and can't afford it  No noct symptoms after hs saba/ cpap  saba use up when no access to dulera 200 but even on it he continues to perceive need for saba daily despite nl exams/ pfts rec Please see patient coordinator before you leave today  to schedule a methacholine challenge test>  POSITIVE Ok to resume dulera but stop it 24 hours before the methacholine and no albuterol w/in 6 hours  Please schedule a follow up office visit in 4 weeks, sooner if needed      COVID Nov 02 2021    05/20/2022  f/u ov/[name]/Joseph [name] re: AB maint on Breo 200  Chief Complaint  Patient presents with   Follow-up    Coughing has cleared up   Dyspnea:  Not limited by breathing from desired activities   Cough: none Sleeping: on cpap x years / Joseph [name] has seen most recently  SABA use: hfa/neb rarely  02: none  Rec No change in medications  Please schedule a follow up visit in 12 months but call sooner if needed   Add: rec annual LDSCT> not done as of 07/01/2023     07/01/2023  f/u ov/[name]/Joseph [name] re: AB maint on ***  No chief complaint on file.   Dyspnea:  *** Cough: *** Sleeping: *** SABA use: *** 02: *** Covid status: *** Lung cancer screening: ***   No obvious day to day or daytime variability or assoc excess/ purulent sputum or mucus plugs or hemoptysis or cp or  chest tightness, subjective wheeze or overt sinus or hb symptoms.   *** without nocturnal  or early am exacerbation  of respiratory  c/o's or need for noct saba. Also denies any obvious fluctuation of symptoms with weather or environmental changes or other aggravating or alleviating factors except as outlined above   No unusual exposure hx or h/o childhood pna/ asthma or knowledge of premature birth.  Current Allergies, Complete  Past Medical History, Past Surgical History, Family History, and Social History were reviewed in [employer] record.  ROS  The following are not active complaints unless bolded Hoarseness, sore throat, dysphagia, dental problems, itching, sneezing,  nasal congestion or discharge of excess mucus or purulent secretions, ear ache,   fever, chills, sweats, unintended wt loss or wt gain, classically pleuritic or exertional cp,  orthopnea pnd or arm/hand swelling  or leg swelling, presyncope, palpitations, abdominal pain, anorexia, nausea, vomiting, diarrhea  or change in bowel habits or change in bladder habits, change in stools or change in urine, dysuria, hematuria,  rash, arthralgias, visual complaints, headache, numbness, weakness or ataxia or problems with walking or coordination,  change in mood or  memory.        No outpatient medications have been marked as taking for the 07/01/23 encounter (Appointment) with Nyoka Cowden, MD.                                  Objective:   Physical Exam  Wts                  07/01/2023      ***  05/20/2022      241  12/18/2021      248  08/18/2020      252  01/04/2020        251  12/31/2013      294  >    07/27/2014  286 > 11/29/2014   289> 03/08/2015  284 > 10/03/2015 266 > 10/31/2015 273 > 12/12/2015    280 > 05/23/2017  245 > 11/10/2018  245                      Assessment & Plan:

## 2023-07-01 ENCOUNTER — Encounter: Payer: Self-pay | Admitting: Internal Medicine

## 2023-07-01 ENCOUNTER — Ambulatory Visit (INDEPENDENT_AMBULATORY_CARE_PROVIDER_SITE_OTHER): Payer: Medicaid Other | Admitting: Internal Medicine

## 2023-07-01 VITALS — BP 165/93 | HR 102 | Ht 70.0 in | Wt 254.0 lb

## 2023-07-01 DIAGNOSIS — J45909 Unspecified asthma, uncomplicated: Secondary | ICD-10-CM | POA: Diagnosis not present

## 2023-07-01 DIAGNOSIS — F17210 Nicotine dependence, cigarettes, uncomplicated: Secondary | ICD-10-CM | POA: Diagnosis not present

## 2023-07-01 MED ORDER — COMBIVENT RESPIMAT 20-100 MCG/ACT IN AERS: INHALATION_SPRAY | RESPIRATORY_TRACT | 5 refills | Status: DC

## 2023-07-01 MED ORDER — FLUTICASONE FUROATE-VILANTEROL 200-25 MCG/ACT IN AEPB: INHALATION_SPRAY | RESPIRATORY_TRACT | 3 refills | Status: DC

## 2023-07-01 NOTE — Patient Instructions (Signed)
Plan A = Automatic = Always=    BREO  200 one click each am   Plan B = Backup (to supplement plan A, not to replace it) Only use your combivent  inhaler as a rescue medication to be used if you can't catch your breath by resting or doing a relaxed purse lip breathing pattern.  - The less you use it, the better it will work when you need it. - Ok to use the inhaler up to 1puffs  every 6 hours if you must but call for appointment if use goes up over your usual need - Don't leave home without it !!  (think of it like the spare tire for your car)   Plan C = Crisis (instead of Plan B but only if Plan B stops working) - only use your albuterol nebulizer if you first try Plan B and it fails to help > ok to use the nebulizer up to every 4 hours but if start needing it regularly call for immediate appointment   The key is to stop smoking completely before smoking completely stops you!  My office will be contacting you by phone for referral to lung cancer screening   - if you don't hear back from my office within one week please call us back or notify us thru MyChart and we'll address it right away.      Please schedule a follow up visit in 12 months but call sooner if needed

## 2023-07-01 NOTE — Assessment & Plan Note (Addendum)
Referred for LCS  07/01/2023   4-5 min discussion re active cigarette smoking in addition to office E&M  Ask about tobacco use:   ongoing Advise quitting   I took an extended  opportunity with this patient to outline the consequences of continued cigarette use  in airway disorders based on all the data we have from the multiple national lung health studies (perfomed over decades at millions of dollars in cost)  indicating that smoking cessation, not choice of inhalers or pulmonary physicians, is the most important aspect of his care.   Assess willingness:  Not committed at this point Assist in quit attempt:  Per PCP when ready Arrange follow up:   Follow up per Primary Care planned     Low-dose CT lung cancer screening is recommended for patients who are 40-60 years of age with a 20+ pack-year history of smoking and who are currently smoking or quit <=15 years ago. No coughing up blood  No unintentional weight loss of > 15 pounds in the last 6 months - pt is eligible for scanning yearly until quits smoking x 25 y or age 67, which comes first  Discussed in detail all the  indications, usual  risks and alternatives  relative to the benefits with patient who agrees to proceed with w/u as outlined.

## 2023-07-01 NOTE — Assessment & Plan Note (Addendum)
Active smoker - PFT's s sign copd 06/2012 - med calendar 2/13 /15 > not using 11/29/14  - 11/29/2014 p extensive coaching HFA effectiveness =    90% from a baseline of 50%  -PFTs wnl 03/08/2015 4 h p alb neb> rec MCT at least 6 h p neb > done 10/12/2015  POS Methacholine challenge > resume dulera 100 2bid - 10/31/2015    increased dulera to 200 bid (samples provided)  - 05/23/2017  BREO 200 rx initiated > much better  - Spirometry 11/10/2018  FEV1 3.0 (76%)  Ratio 84 s curvature after am BREO 200  - 01/04/2020  After extensive coaching inhaler device,  effectiveness =    90% > change back to dulera 200 2bid due to severe cough on breo and overuse of saba  - 05/17/2020 req to go back to breo 200 > improved 08/18/2020   ACOS and treating more like AB than emphysema/ well compensated on BREO 200 with approp prn  F/u can be yearly          Each maintenance medication was reviewed in detail including emphasizing most importantly the difference between maintenance and prns and under what circumstances the prns are to be triggered using an action plan format where appropriate.  Total time for H and P, chart review, counseling, reviewing dpi/ smi / neb device(s) and generating customized AVS unique to this office visit / same day charting = 

## 2023-07-02 ENCOUNTER — Other Ambulatory Visit: Payer: Self-pay | Admitting: Primary Care

## 2023-07-02 DIAGNOSIS — E1165 Type 2 diabetes mellitus with hyperglycemia: Secondary | ICD-10-CM

## 2023-07-08 ENCOUNTER — Encounter: Payer: Self-pay | Admitting: *Deleted

## 2023-07-10 ENCOUNTER — Telehealth: Payer: Self-pay | Admitting: Internal Medicine

## 2023-07-10 NOTE — Telephone Encounter (Signed)
Spoke with [name] and clarified rx, should be Q4H PRN. Nothing further needed at this time.

## 2023-07-10 NOTE — Telephone Encounter (Signed)
Joseph [name] called in bc she states the albuterol prescription has the wrong instructions and needs correction so Medicare can get it covered

## 2023-07-11 ENCOUNTER — Encounter: Payer: Self-pay | Admitting: Primary Care

## 2023-07-11 ENCOUNTER — Ambulatory Visit (INDEPENDENT_AMBULATORY_CARE_PROVIDER_SITE_OTHER): Payer: Medicare Other | Admitting: Primary Care

## 2023-07-11 VITALS — BP 122/80 | HR 110 | Temp 97.5°F | Ht 70.0 in | Wt 250.0 lb

## 2023-07-11 DIAGNOSIS — E1165 Type 2 diabetes mellitus with hyperglycemia: Secondary | ICD-10-CM | POA: Diagnosis not present

## 2023-07-11 DIAGNOSIS — Z794 Long term (current) use of insulin: Secondary | ICD-10-CM

## 2023-07-11 LAB — POCT GLYCOSYLATED HEMOGLOBIN (HGB A1C): Hemoglobin A1C: 7.9 % — AB (ref 4.0–5.6)

## 2023-07-11 MED ORDER — GLIPIZIDE 10 MG PO TABS
10.0000 mg | ORAL_TABLET | Freq: Two times a day (BID) | ORAL | 1 refills | Status: DC
Start: 2023-07-11 — End: 2024-01-15

## 2023-07-11 MED ORDER — TIRZEPATIDE 2.5 MG/0.5ML ~~LOC~~ SOAJ
2.5000 mg | SUBCUTANEOUS | 0 refills | Status: DC
Start: 2023-07-11 — End: 2024-01-30

## 2023-07-11 NOTE — Patient Instructions (Signed)
Start tirzepitide Greggory Keen) for diabetes/weight loss. Start by injecting 2.5 mg into the skin once weekly for 4 weeks, then increase to 5 mg once weekly thereafter. Please notify me once you've used your last 2.5 mg pen so that I can prescribe the next dose.   Please schedule a follow up visit for 3 months for diabetes check.  It was a pleasure to see you today!

## 2023-07-11 NOTE — Progress Notes (Signed)
Subjective:    Patient ID: Joseph [name], male    DOB: 1971-09-27, 52 y.o.   MRN: [id number]  HPI  Joseph [name] is a very pleasant 52 y.o. male who presents today for follow-up of diabetes.  Current medications include: Metformin XR 1000 mg twice daily, Jardiance 10 mg daily, glipizide 10 mg twice daily, Tresiba 35 units daily  He is checking his blood glucose continuously. He is 61% time in range of 70-180.   Last A1C: 7.4 in February 2024, 7.9 today Last Eye Exam: Up-to-date Last Foot Exam: Due Pneumonia Vaccination: 2019 Urine Microalbumin: Up-to-date Statin: Atorvastatin  Dietary changes since last visit: None.    Exercise: None due to chronic knee pain    Review of Systems  Respiratory:  Negative for shortness of breath.   Cardiovascular:  Negative for chest pain.  Musculoskeletal:  Positive for arthralgias.  Neurological:  Negative for numbness.         Past Medical History:  Diagnosis Date   Acute encephalopathy 03/24/2018   Anxiety    Arthritis    oa left knee and lower back   Asthma    Bucket handle tear of meniscus of knee (Right)    Cataract    hx of bilateral   CHF (congestive heart failure) (HCC)    Chronic back pain    Chronic pain of left knee    COPD (chronic obstructive pulmonary disease) (HCC)    COVID-19 10/31/2021   Diabetes mellitus    type 2   DVT (deep venous thrombosis) (HCC) 01/09/2022   GERD (gastroesophageal reflux disease)    INGUINAL PAIN, LEFT 10/03/2009   Qualifier: Diagnosis of  By: Darrick Penna MD, Sandi L    Medial meniscus, posterior horn derangement 04/22/2014   Oral thrush 11/10/2018   Osteomyelitis (HCC) 02/14/2022   Overdose    03-24-18 accidental   Rash and nonspecific skin eruption 03/12/2017   S/P knee surgery 04/26/2014   Surgery May 22 partial medial meniscectomy   Septic arthritis (HCC) 01/09/2022   Septic arthritis of knee (HCC) 01/25/2022   Septic arthritis of knee, right (HCC) 01/09/2022   Serratia  marcescens infection 02/14/2022   Shortness of breath    with heavy exertion   Sleep apnea    Status post unicompartmental knee replacement, left 07/21/2018    Social History   Socioeconomic History   Marital status: Divorced    Spouse name: Not on file   Number of children: Not on file   Years of education: Not on file   Highest education level: Not on file  Occupational History   Occupation: disability  Tobacco Use   Smoking status: Every Day    Current packs/day: 2.00    Average packs/day: 2.0 packs/day for 29.6 years (59.1 ttl pk-yrs)    Types: Cigarettes    Start date: 12/17/1993   Smokeless tobacco: Never   Tobacco comments:    States 2 ppd, wants to quit but has had 3 surgeries in 2 months  Vaping Use   Vaping status: Former  Substance and Sexual Activity   Alcohol use: Never    Alcohol/week: 0.0 standard drinks of alcohol   Drug use: Never   Sexual activity: Yes    Birth control/protection: Surgical  Other Topics Concern   Not on file  Social History Narrative   Recent visit to [facility] in [place] d/t increased pain where he was prescribed percocet 7.5-325 mg for pain.   Social Determinants of Health   Financial  Resource Strain: Medium Risk (04/17/2022)   Overall Financial Resource Strain (CARDIA)    Difficulty of Paying Living Expenses: Somewhat hard  Food Insecurity: No Food Insecurity (04/17/2022)   Hunger Vital Sign    Worried About Running Out of Food in the Last Year: Never true    Ran Out of Food in the Last Year: Never true  Transportation Needs: Not on file  Physical Activity: Not on file  Stress: Not on file  Social Connections: Not on file  Intimate Partner Violence: Not on file    Past Surgical History:  Procedure Laterality Date   CARPAL TUNNEL RELEASE Left    CATARACT EXTRACTION W/PHACO Right 03/11/2016   Procedure: CATARACT EXTRACTION PHACO AND INTRAOCULAR LENS PLACEMENT (IOC);  Surgeon: Gemma Payor, MD;  Location: [facility];  Service:  Ophthalmology;  Laterality: Right;  CDE 4.24   EYE SURGERY Right 2018   ioc with lens replacement   HERNIA REPAIR Bilateral 2010   umbilical   INCISION AND DRAINAGE ABSCESS Right 01/02/2022   Procedure: RIGHT KNEE ARTHROSCOPIC IRRIGATION AND DEBRIDEMENT;  Surgeon: Joen Laura, MD;  Location[facility];  Service: Orthopedics;  Laterality: Right;   INCISION AND DRAINAGE ABSCESS Right 01/26/2022   Procedure: INCISION AND DRAINAGE ABSCESS POST OP RIGHT KNEE ARTHROSCOPY;  Surgeon: Joen Laura, MD;  Location: [facility];  Service: Orthopedics;  Laterality: Right;   KNEE ARTHROSCOPY WITH MEDIAL MENISECTOMY Left 04/22/2014   Procedure: LEFT KNEE ARTHROSCOPY WITH MEDIAL MENISECTOMY;  Surgeon: Vickki Hearing, MD;  Location: [facility];  Service: Orthopedics;  Laterality: Left;   KNEE ARTHROSCOPY WITH MEDIAL MENISECTOMY Right 12/06/2015   Procedure: RIGHT KNEE ARTHROSCOPY WITH MEDIAL MENISECTOMY;  Surgeon: Vickki Hearing, MD;  Location: [facility];  Service: Orthopedics;  Laterality: Right;   KNEE ARTHROSCOPY WITH MEDIAL MENISECTOMY Left 03/20/2017   Procedure: KNEE ARTHROSCOPY WITH MEDIAL MENISECTOMY;  Surgeon: Vickki Hearing, MD;  Location: [facility];  Service: Orthopedics;  Laterality: Left;   ORIF WRIST FRACTURE Left 12/19/2016   Procedure: OPEN REDUCTION INTERNAL FIXATION (ORIF) WRIST FRACTURE;  Surgeon: Betha Loa, MD;  Location: [facility];  Service: Orthopedics;  Laterality: Left;  accumed    PARTIAL KNEE ARTHROPLASTY Left 07/21/2018   Procedure: LEFT UNICOMPARTMENTAL KNEE;  Surgeon: Sheral Apley, MD;  Location: [facility];  Service: Orthopedics;  Laterality: Left;   SHOULDER ARTHROCENTESIS Right 2008 or 2010   VASECTOMY  2014    Family History  Problem Relation Age of Onset   Emphysema Maternal Grandfather    Emphysema Maternal Grandmother    Clotting disorder Maternal Grandmother     Allergies  Allergen Reactions   Keflex [Cephalexin] Nausea Only    Metformin Nausea And Vomiting   Symbicort [Budesonide-Formoterol Fumarate] Other (See Comments)    States that 3 doses were used and breathing became worse    Current Outpatient Medications on File Prior to Visit  Medication Sig Dispense Refill   albuterol (PROVENTIL) (2.5 MG/3ML) 0.083% nebulizer solution Take 3 mLs (2.5 mg total) by nebulization every 2 (two) hours as needed for wheezing or shortness of breath. 360 mL 2   atorvastatin (LIPITOR) 40 MG tablet TAKE ONE (1) TABLET BY MOUTH EVERY DAY FOR CHOLESTEROL 90 tablet 2   baclofen (LIORESAL) 10 MG tablet Take 10 mg by mouth 3 (three) times daily.     blood glucose meter kit and supplies KIT Dispense based on patient and insurance preference. Use up to four times daily as directed. (Dx  code E11.65) 1 each 0   buPROPion (WELLBUTRIN SR) 150 MG 12 hr tablet TAKE ONE TABLET (150MG  TOTAL) BY MOUTH TWO TIMES DAILY FOR ANXIETY AND DEPRESSION 180 tablet 2   cetirizine (ZYRTEC) 10 MG tablet Take 1 tablet (10 mg total) by mouth daily. 30 tablet 11   citalopram (CELEXA) 40 MG tablet TAKE ONE TABLET (40MG  TOTAL) BY MOUTH DAILY FOR ANXIETY AND DEPRESSION 90 tablet 2   Continuous Blood Gluc Sensor (FREESTYLE LIBRE 2 SENSOR) MISC by Does not apply route. Apply sensor every 14 days to monitor sugar continuously. Via [facility]     fluticasone furoate-vilanterol (BREO ELLIPTA) 200-25 MCG/ACT AEPB One click each am 180 each 3   gabapentin (NEURONTIN) 300 MG capsule TAKE TWO CAPSULES BY MOUTH TWICE DAILY. 360 capsule 3   glucose blood test strip Use as instructed to test blood sugar up to 4 times daily. (Dx code E11.65) 100 each 12   hydrOXYzine (ATARAX) 10 MG tablet Take 1-2 tablets (10-20 mg total) by mouth 2 (two) times daily as needed. For anxiety. 180 tablet 0   insulin degludec (TRESIBA FLEXTOUCH) 100 UNIT/ML FlexTouch Pen INJECT 35 UNITS INTO THE SKIN DAILY FOR DIABETES 30 mL 1   Ipratropium-Albuterol (COMBIVENT RESPIMAT) 20-100 MCG/ACT AERS respimat  One puff every 6 hours as needed 4 g 5   JARDIANCE 10 MG TABS tablet TAKE 1 TABLET(10 MG) BY MOUTH DAILY FOR DIABETES 90 tablet 0   Lancets MISC Use as directed to check blood sugar up to 4 times daily. (Dx code E11.65) 100 each 3   lansoprazole (PREVACID) 30 MG capsule TAKE ONE CAPSULE (30MG  TOTAL) BY MOUTH TWO TIMES DAILY BEFORE A MEAL 180 capsule 2   losartan (COZAAR) 50 MG tablet Take 1 tablet (50 mg total) by mouth daily. For blood pressure. 90 tablet 3   metFORMIN (GLUCOPHAGE-XR) 500 MG 24 hr tablet TAKE TWO TABLETS (1000MG  TOTAL) BY MOUTHTWO TIMES DAILY WITH A MEAL FOR DIABETES 360 tablet 1   MOVANTIK 25 MG TABS tablet Take 25 mg by mouth every morning.     NUCYNTA 100 MG TABS Take 1 tablet by mouth 4 (four) times daily.     OVER THE COUNTER MEDICATION Take 2 tablets by mouth every morning. Osteo Biflex triple strength with turmeric     OVER THE COUNTER MEDICATION Take 1 tablet by mouth every morning. Neuriva     tadalafil (CIALIS) 20 MG tablet Take 20 mg by mouth daily as needed for erectile dysfunction.     tamsulosin (FLOMAX) 0.4 MG CAPS capsule TAKE ONE CAPSULE BY MOUTH DAILY 90 capsule 1   Testosterone 40.5 MG/2.5GM (1.62%) GEL Place 2.5 g onto the skin every morning. 2 pumps - 2.5 g     UNIFINE PENTIPS 31G X 6 MM MISC USE NIGHTLY WITH INSULIN 100 each 3   zolpidem (AMBIEN) 10 MG tablet TAKE ONE TABLET (10MG  TOTAL) BY MOUTH ATBEDTIME AS NEEDED FOR SLEEP 90 tablet 0   No current facility-administered medications on file prior to visit.    BP 122/80 (BP Location: Left Arm, Patient Position: Sitting, Cuff Size: Large)   Pulse (!) 110   Temp (!) 97.5 F (36.4 C)   Ht 5\' 10"  (1.778 m)   Wt 250 lb (113.4 kg)   SpO2 96%   BMI 35.87 kg/m  Objective:   Physical Exam Cardiovascular:     Rate and Rhythm: Normal rate and regular rhythm.  Pulmonary:     Effort: Pulmonary effort is normal.  Breath sounds: Normal breath sounds. No wheezing or rales.  Musculoskeletal:     Cervical  back: Neck supple.  Skin:    General: Skin is warm and dry.  Neurological:     Mental Status: He is alert and oriented to person, place, and time.           Assessment & Plan:  Type 2 diabetes mellitus with hyperglycemia, with long-term current use of insulin (HCC) -     POCT glycosylated hemoglobin (Hb A1C) -     glipiZIDE; Take 1 tablet (10 mg total) by mouth 2 (two) times daily before a meal. for diabetes.  Dispense: 180 tablet; Refill: 1 -     Tirzepatide; Inject 2.5 mg into the skin once a week. for diabetes.  Dispense: 2 mL; Refill: 0        Doreene Nest, NP

## 2023-07-11 NOTE — Assessment & Plan Note (Addendum)
Worse with A1C of 7.9 today.  Discussed options. He needs to work on weight loss with current BMI of 35.  Start tirzepitide Joseph [name]) for diabetes/weight loss. Start by injecting 2.5 mg into the skin once weekly for 4 weeks, then increase to 5 mg once weekly thereafter.  Continue glipizide 10 mg BID, metformin XR 1000 mg BID, Jardiance 10 mg daily, Tresiba 35 units daily. Consider dose reduction of glipizide or insulin if he begins to experience hypoglycemic episodes.  Foot exam today.  Follow up in 3 months.

## 2023-07-12 ENCOUNTER — Other Ambulatory Visit: Payer: Self-pay | Admitting: Primary Care

## 2023-07-12 DIAGNOSIS — E119 Type 2 diabetes mellitus without complications: Secondary | ICD-10-CM

## 2023-08-01 ENCOUNTER — Other Ambulatory Visit: Payer: Self-pay | Admitting: Primary Care

## 2023-08-01 DIAGNOSIS — I10 Essential (primary) hypertension: Secondary | ICD-10-CM

## 2023-08-01 DIAGNOSIS — G8929 Other chronic pain: Secondary | ICD-10-CM

## 2023-08-01 DIAGNOSIS — G4700 Insomnia, unspecified: Secondary | ICD-10-CM

## 2023-08-18 ENCOUNTER — Encounter: Payer: Self-pay | Admitting: Emergency Medicine

## 2023-08-27 ENCOUNTER — Other Ambulatory Visit: Payer: Self-pay | Admitting: Internal Medicine

## 2023-08-29 ENCOUNTER — Telehealth: Payer: Self-pay

## 2023-08-29 DIAGNOSIS — J45909 Unspecified asthma, uncomplicated: Secondary | ICD-10-CM

## 2023-08-29 NOTE — Telephone Encounter (Signed)
Per [employer] not covered by [employer].   Alternatives are: Advair Breo  Please advise, thanks!

## 2023-09-01 ENCOUNTER — Encounter: Payer: Self-pay | Admitting: Internal Medicine

## 2023-09-01 ENCOUNTER — Other Ambulatory Visit: Payer: Self-pay | Admitting: Primary Care

## 2023-09-01 DIAGNOSIS — E1165 Type 2 diabetes mellitus with hyperglycemia: Secondary | ICD-10-CM

## 2023-09-01 DIAGNOSIS — J45909 Unspecified asthma, uncomplicated: Secondary | ICD-10-CM

## 2023-09-02 ENCOUNTER — Other Ambulatory Visit: Payer: Self-pay | Admitting: Primary Care

## 2023-09-02 ENCOUNTER — Other Ambulatory Visit: Payer: Self-pay

## 2023-09-02 DIAGNOSIS — E119 Type 2 diabetes mellitus without complications: Secondary | ICD-10-CM

## 2023-09-02 MED ORDER — UNIFINE PENTIPS 31G X 6 MM MISC
1 refills | Status: DC
Start: 2023-09-02 — End: 2023-09-05

## 2023-09-02 NOTE — Telephone Encounter (Signed)
Advair 250 one bid

## 2023-09-03 ENCOUNTER — Other Ambulatory Visit: Payer: Self-pay | Admitting: Family

## 2023-09-03 DIAGNOSIS — J302 Other seasonal allergic rhinitis: Secondary | ICD-10-CM

## 2023-09-03 MED ORDER — ALBUTEROL SULFATE (2.5 MG/3ML) 0.083% IN NEBU: 2.5000 mg | INHALATION_SOLUTION | RESPIRATORY_TRACT | 2 refills | Status: DC | PRN

## 2023-09-03 MED ORDER — FLUTICASONE-SALMETEROL 250-50 MCG/ACT IN AEPB
1.0000 | INHALATION_SPRAY | Freq: Two times a day (BID) | RESPIRATORY_TRACT | 11 refills | Status: DC
Start: 2023-09-03 — End: 2023-09-03

## 2023-09-03 MED ORDER — FLUTICASONE-SALMETEROL 250-50 MCG/ACT IN AEPB: 1.0000 | INHALATION_SPRAY | Freq: Two times a day (BID) | RESPIRATORY_TRACT | 11 refills | Status: DC

## 2023-09-03 NOTE — Addendum Note (Signed)
Addended by: Shelby Dubin on: 09/03/2023 09:35 AM   Modules accepted: Orders

## 2023-09-03 NOTE — Telephone Encounter (Signed)
Rx sent to pharmacy   

## 2023-09-04 ENCOUNTER — Telehealth: Payer: Self-pay | Admitting: Internal Medicine

## 2023-09-04 ENCOUNTER — Other Ambulatory Visit: Payer: Self-pay

## 2023-09-04 DIAGNOSIS — J45909 Unspecified asthma, uncomplicated: Secondary | ICD-10-CM

## 2023-09-04 MED ORDER — ALBUTEROL SULFATE (2.5 MG/3ML) 0.083% IN NEBU: 2.5000 mg | INHALATION_SOLUTION | RESPIRATORY_TRACT | 2 refills | Status: DC | PRN

## 2023-09-04 NOTE — Telephone Encounter (Signed)
Per Dr Sherene Sires Sudie Grumbling note patient should be using this every 4 hours prn and not 2 q prn. Corrected order sent this to [facility] , called and notified the patient.

## 2023-09-04 NOTE — Telephone Encounter (Signed)
Patient states that when the refill was called in for his Albuterol--the directions were given for every 2 hours.  He and Dr. Sherene Sires had discussed his using this  every 4 hours.  The insurance will no cover the refill for every 2 hours.  Please send corrected directions for every 4 hours to [facility]   Patient's call back is [phone]

## 2023-09-05 ENCOUNTER — Telehealth: Payer: Self-pay

## 2023-09-05 ENCOUNTER — Other Ambulatory Visit: Payer: Self-pay | Admitting: Primary Care

## 2023-09-05 DIAGNOSIS — Z1212 Encounter for screening for malignant neoplasm of rectum: Secondary | ICD-10-CM

## 2023-09-05 DIAGNOSIS — Z1211 Encounter for screening for malignant neoplasm of colon: Secondary | ICD-10-CM

## 2023-09-05 MED ORDER — PEN NEEDLES 31G X 6 MM MISC: 1 refills | Status: DC

## 2023-09-05 NOTE — Telephone Encounter (Signed)
[id number] Stracener May 05, 1971  Pt needs a pa

## 2023-09-05 NOTE — Telephone Encounter (Signed)
Pt is needing a PA   [name] [id number]

## 2023-09-08 NOTE — Telephone Encounter (Signed)
Joseph [name], can you call his pharmacy to find out what's going on with his insulin pen needles?

## 2023-09-09 ENCOUNTER — Other Ambulatory Visit (HOSPITAL_COMMUNITY): Payer: Self-pay

## 2023-09-09 ENCOUNTER — Telehealth: Payer: Self-pay

## 2023-09-09 NOTE — Telephone Encounter (Signed)
PA request has been Approved. New Encounter created for follow up. For additional info see Pharmacy Prior Auth telephone encounter from 10/08.

## 2023-09-09 NOTE — Telephone Encounter (Signed)
Must be billed under patients Part B for nebulizer solutions.

## 2023-09-09 NOTE — Telephone Encounter (Signed)
Received faxed that pt medication had  been filled

## 2023-09-09 NOTE — Telephone Encounter (Signed)
*  Pulm  Pharmacy Patient Advocate Encounter  Received notification from [facility] that Prior Authorization for Wixela Inhub 250-50MCG/ACT aerosol powder  has been APPROVED from 09/09/2023 to 12/01/2098. Ran test claim, Copay is $0.00. This test claim was processed through [facility]- copay amounts may vary at other pharmacies due to pharmacy/plan contracts, or as the patient moves through the different stages of their insurance plan.   PA #/Case ID/Reference #: [id number]

## 2023-09-11 ENCOUNTER — Other Ambulatory Visit (HOSPITAL_COMMUNITY): Payer: Self-pay

## 2023-09-11 ENCOUNTER — Telehealth: Payer: Self-pay

## 2023-09-11 NOTE — Telephone Encounter (Signed)
Pharmacy Patient Advocate Encounter   Received notification from Patient Advice Request messages that prior authorization for Unifine Pentips 31G X 6 MM is required/requested.   Insurance verification completed.   The patient is insured through [facility] .   Per test claim: PA required; PA submitted to [facility] via CoverMyMeds Key/confirmation #/EOC [id number] Status is pending

## 2023-09-12 ENCOUNTER — Other Ambulatory Visit (HOSPITAL_COMMUNITY): Payer: Self-pay

## 2023-09-12 DIAGNOSIS — E1165 Type 2 diabetes mellitus with hyperglycemia: Secondary | ICD-10-CM

## 2023-09-12 MED ORDER — PEN NEEDLES 31G X 6 MM MISC: 1 refills | Status: DC

## 2023-09-12 NOTE — Telephone Encounter (Signed)
Pharmacy Patient Advocate Encounter  Received notification from [facility] that Prior Authorization for Unifine Pentips 31G X 6 MM  has been APPROVED from 09/11/23 to 12/01/98. Ran test claim, Copay is $0. This test claim was processed through [facility]- copay amounts may vary at other pharmacies due to pharmacy/plan contracts, or as the patient moves through the different stages of their insurance plan.   PA #/Case ID/Reference #:  [id number]    Approved. This drug has been approved under the Member's Medicare Part D benefit for UNIFINE PENTIPS Misc 31G X 6 MM. Approved quantity: 100 per 90 day(s).

## 2023-09-12 NOTE — Telephone Encounter (Signed)
Harvin Hazel, please send this to his preferred pharmacy.

## 2023-10-06 ENCOUNTER — Other Ambulatory Visit: Payer: Self-pay

## 2023-10-06 DIAGNOSIS — E1165 Type 2 diabetes mellitus with hyperglycemia: Secondary | ICD-10-CM

## 2023-10-06 MED ORDER — EMPAGLIFLOZIN 10 MG PO TABS: 10.0000 mg | ORAL_TABLET | Freq: Every day | ORAL | 0 refills | Status: DC

## 2023-10-14 ENCOUNTER — Ambulatory Visit: Payer: Medicare Other | Admitting: Primary Care

## 2023-10-21 ENCOUNTER — Ambulatory Visit: Payer: Medicare Other | Admitting: Primary Care

## 2023-10-24 DIAGNOSIS — E1165 Type 2 diabetes mellitus with hyperglycemia: Secondary | ICD-10-CM

## 2023-10-24 MED ORDER — TRESIBA FLEXTOUCH 100 UNIT/ML ~~LOC~~ SOPN: 35.0000 [IU] | PEN_INJECTOR | Freq: Every day | SUBCUTANEOUS | 0 refills | Status: DC

## 2023-11-03 ENCOUNTER — Other Ambulatory Visit: Payer: Self-pay | Admitting: Primary Care

## 2023-11-03 DIAGNOSIS — G4700 Insomnia, unspecified: Secondary | ICD-10-CM

## 2023-11-03 DIAGNOSIS — G8929 Other chronic pain: Secondary | ICD-10-CM

## 2023-11-04 NOTE — Telephone Encounter (Signed)
Unable to reach patient. Left voicemail to return call to our office.   

## 2023-11-04 NOTE — Telephone Encounter (Signed)
Please call patient:  Received refill request for Ambien from [facility] pharmacy. Does he need refills? If so, which pharmacy since he has moved?

## 2023-11-04 NOTE — Telephone Encounter (Signed)
Patient returned call and stated that he does need the refills on the Ambien,and that he is still using [employer].

## 2023-11-05 NOTE — Telephone Encounter (Signed)
Refills sent to pharmacy. 

## 2023-11-13 DIAGNOSIS — M25561 Pain in right knee: Secondary | ICD-10-CM

## 2023-11-13 DIAGNOSIS — G4700 Insomnia, unspecified: Secondary | ICD-10-CM

## 2023-11-14 MED ORDER — ZOLPIDEM TARTRATE 10 MG PO TABS: 10.0000 mg | ORAL_TABLET | Freq: Every day | ORAL | 0 refills | Status: DC

## 2023-11-14 NOTE — Telephone Encounter (Signed)
Medication sent in [place] absence

## 2023-12-01 DIAGNOSIS — K219 Gastro-esophageal reflux disease without esophagitis: Secondary | ICD-10-CM

## 2023-12-01 DIAGNOSIS — F419 Anxiety disorder, unspecified: Secondary | ICD-10-CM

## 2023-12-02 MED ORDER — CITALOPRAM HYDROBROMIDE 40 MG PO TABS: 40.0000 mg | ORAL_TABLET | Freq: Every day | ORAL | 0 refills | Status: DC

## 2023-12-15 ENCOUNTER — Other Ambulatory Visit: Payer: Self-pay | Admitting: Nurse Practitioner

## 2023-12-15 DIAGNOSIS — G4700 Insomnia, unspecified: Secondary | ICD-10-CM

## 2023-12-15 DIAGNOSIS — G8929 Other chronic pain: Secondary | ICD-10-CM

## 2023-12-17 NOTE — Telephone Encounter (Signed)
 Called lvm for pt to call the office.

## 2023-12-17 NOTE — Telephone Encounter (Signed)
 Patient is due for CPE/follow up in mid February, this will be required prior to any further refills.  Please schedule, thank you!

## 2023-12-21 MED ORDER — LANSOPRAZOLE 30 MG PO CPDR: 30.0000 mg | DELAYED_RELEASE_CAPSULE | Freq: Two times a day (BID) | ORAL | 0 refills | Status: DC

## 2023-12-26 DIAGNOSIS — Z79899 Other long term (current) drug therapy: Secondary | ICD-10-CM | POA: Diagnosis not present

## 2023-12-26 DIAGNOSIS — E559 Vitamin D deficiency, unspecified: Secondary | ICD-10-CM | POA: Diagnosis not present

## 2023-12-26 DIAGNOSIS — E7800 Pure hypercholesterolemia, unspecified: Secondary | ICD-10-CM | POA: Diagnosis not present

## 2023-12-26 DIAGNOSIS — F32A Depression, unspecified: Secondary | ICD-10-CM

## 2023-12-26 DIAGNOSIS — R5383 Other fatigue: Secondary | ICD-10-CM | POA: Diagnosis not present

## 2023-12-26 DIAGNOSIS — E119 Type 2 diabetes mellitus without complications: Secondary | ICD-10-CM | POA: Diagnosis not present

## 2023-12-26 DIAGNOSIS — M129 Arthropathy, unspecified: Secondary | ICD-10-CM | POA: Diagnosis not present

## 2023-12-26 MED ORDER — BUPROPION HCL ER (SR) 150 MG PO TB12: 150.0000 mg | ORAL_TABLET | Freq: Two times a day (BID) | ORAL | 0 refills | Status: DC

## 2023-12-31 ENCOUNTER — Telehealth: Payer: Self-pay | Admitting: Primary Care

## 2023-12-31 NOTE — Telephone Encounter (Signed)
Received faxed request. Faxed ov notes as requested.

## 2023-12-31 NOTE — Telephone Encounter (Signed)
Copied from CRM [id number]. Topic: Medical Record Request - Other >> Dec 31, 2023  2:02 PM Orinda Kenner C wrote: Reason for CRM: Landard from [facility] [phone] faxed a request for medical records for diabetics supplies and needs last office visit. Fax# [phone]

## 2024-01-05 DIAGNOSIS — Z794 Long term (current) use of insulin: Secondary | ICD-10-CM | POA: Diagnosis not present

## 2024-01-05 DIAGNOSIS — E1165 Type 2 diabetes mellitus with hyperglycemia: Secondary | ICD-10-CM | POA: Diagnosis not present

## 2024-01-08 DIAGNOSIS — K6289 Other specified diseases of anus and rectum: Secondary | ICD-10-CM | POA: Diagnosis not present

## 2024-01-08 DIAGNOSIS — K645 Perianal venous thrombosis: Secondary | ICD-10-CM | POA: Diagnosis not present

## 2024-01-08 DIAGNOSIS — L539 Erythematous condition, unspecified: Secondary | ICD-10-CM | POA: Diagnosis not present

## 2024-01-08 DIAGNOSIS — F17210 Nicotine dependence, cigarettes, uncomplicated: Secondary | ICD-10-CM | POA: Diagnosis not present

## 2024-01-08 DIAGNOSIS — L299 Pruritus, unspecified: Secondary | ICD-10-CM | POA: Diagnosis not present

## 2024-01-13 ENCOUNTER — Other Ambulatory Visit: Payer: Self-pay

## 2024-01-13 DIAGNOSIS — Z794 Long term (current) use of insulin: Secondary | ICD-10-CM

## 2024-01-14 DIAGNOSIS — E1165 Type 2 diabetes mellitus with hyperglycemia: Secondary | ICD-10-CM

## 2024-01-14 DIAGNOSIS — E119 Type 2 diabetes mellitus without complications: Secondary | ICD-10-CM

## 2024-01-14 DIAGNOSIS — G8929 Other chronic pain: Secondary | ICD-10-CM

## 2024-01-14 DIAGNOSIS — G894 Chronic pain syndrome: Secondary | ICD-10-CM

## 2024-01-15 MED ORDER — GLIPIZIDE 10 MG PO TABS: 10.0000 mg | ORAL_TABLET | Freq: Two times a day (BID) | ORAL | 0 refills | Status: DC

## 2024-01-15 MED ORDER — GABAPENTIN 300 MG PO CAPS: 600.0000 mg | ORAL_CAPSULE | Freq: Two times a day (BID) | ORAL | 0 refills | Status: DC

## 2024-01-15 MED ORDER — METFORMIN HCL ER 500 MG PO TB24
1000.0000 mg | ORAL_TABLET | Freq: Two times a day (BID) | ORAL | 0 refills | Status: DC
Start: 2024-01-15 — End: 2024-04-14

## 2024-01-19 ENCOUNTER — Other Ambulatory Visit: Payer: Self-pay

## 2024-01-19 DIAGNOSIS — Z794 Long term (current) use of insulin: Secondary | ICD-10-CM

## 2024-01-19 DIAGNOSIS — G4733 Obstructive sleep apnea (adult) (pediatric): Secondary | ICD-10-CM | POA: Diagnosis not present

## 2024-01-19 DIAGNOSIS — J449 Chronic obstructive pulmonary disease, unspecified: Secondary | ICD-10-CM | POA: Diagnosis not present

## 2024-01-19 MED ORDER — TRESIBA FLEXTOUCH 100 UNIT/ML ~~LOC~~ SOPN
35.0000 [IU] | PEN_INJECTOR | Freq: Every day | SUBCUTANEOUS | 0 refills | Status: DC
Start: 2024-01-19 — End: 2024-04-12

## 2024-01-22 ENCOUNTER — Other Ambulatory Visit (HOSPITAL_COMMUNITY): Payer: Self-pay

## 2024-01-22 ENCOUNTER — Telehealth: Payer: Self-pay

## 2024-01-22 NOTE — Telephone Encounter (Signed)
Pharmacy Patient Advocate Encounter   Received notification from [employer] that prior authorization for Lansoprazole 30MG  dr capsules is required/requested.   Insurance verification completed.   The patient is insured through [facility] .   Per test claim: PA required; PA submitted to above mentioned insurance via CoverMyMeds Key/confirmation #/EOC [id number] Status is pending

## 2024-01-23 ENCOUNTER — Other Ambulatory Visit: Payer: Self-pay

## 2024-01-23 DIAGNOSIS — G8929 Other chronic pain: Secondary | ICD-10-CM

## 2024-01-23 DIAGNOSIS — G4700 Insomnia, unspecified: Secondary | ICD-10-CM

## 2024-01-23 MED ORDER — ZOLPIDEM TARTRATE 10 MG PO TABS: 10.0000 mg | ORAL_TABLET | Freq: Every day | ORAL | 0 refills | Status: DC

## 2024-01-26 ENCOUNTER — Other Ambulatory Visit (HOSPITAL_COMMUNITY): Payer: Self-pay

## 2024-01-26 NOTE — Telephone Encounter (Signed)
 Pharmacy Patient Advocate Encounter  Received notification from [employer] that Prior Authorization for Lansoprazole  has been APPROVED from 12/03/23 to 12/01/24   PA #/Case ID/Reference #: [id number]

## 2024-01-29 DIAGNOSIS — E119 Type 2 diabetes mellitus without complications: Secondary | ICD-10-CM | POA: Diagnosis not present

## 2024-01-29 DIAGNOSIS — E6609 Other obesity due to excess calories: Secondary | ICD-10-CM | POA: Diagnosis not present

## 2024-01-29 DIAGNOSIS — M1990 Unspecified osteoarthritis, unspecified site: Secondary | ICD-10-CM | POA: Diagnosis not present

## 2024-01-29 DIAGNOSIS — T402X5A Adverse effect of other opioids, initial encounter: Secondary | ICD-10-CM | POA: Diagnosis not present

## 2024-01-29 DIAGNOSIS — E559 Vitamin D deficiency, unspecified: Secondary | ICD-10-CM | POA: Diagnosis not present

## 2024-01-29 DIAGNOSIS — G894 Chronic pain syndrome: Secondary | ICD-10-CM | POA: Diagnosis not present

## 2024-01-29 DIAGNOSIS — M25561 Pain in right knee: Secondary | ICD-10-CM | POA: Diagnosis not present

## 2024-01-29 DIAGNOSIS — E7800 Pure hypercholesterolemia, unspecified: Secondary | ICD-10-CM | POA: Diagnosis not present

## 2024-01-29 DIAGNOSIS — R030 Elevated blood-pressure reading, without diagnosis of hypertension: Secondary | ICD-10-CM | POA: Diagnosis not present

## 2024-01-29 DIAGNOSIS — K5903 Drug induced constipation: Secondary | ICD-10-CM | POA: Diagnosis not present

## 2024-01-30 ENCOUNTER — Ambulatory Visit (INDEPENDENT_AMBULATORY_CARE_PROVIDER_SITE_OTHER): Payer: Medicare HMO | Admitting: Primary Care

## 2024-01-30 ENCOUNTER — Ambulatory Visit: Payer: Medicare HMO

## 2024-01-30 ENCOUNTER — Encounter: Payer: Self-pay | Admitting: Primary Care

## 2024-01-30 VITALS — BP 170/94 | HR 97 | Temp 98.1°F | Ht 70.0 in | Wt 243.0 lb

## 2024-01-30 VITALS — BP 150/92 | HR 86 | Temp 98.7°F | Resp 22 | Ht 70.0 in | Wt 243.0 lb

## 2024-01-30 DIAGNOSIS — Z23 Encounter for immunization: Secondary | ICD-10-CM

## 2024-01-30 DIAGNOSIS — J449 Chronic obstructive pulmonary disease, unspecified: Secondary | ICD-10-CM

## 2024-01-30 DIAGNOSIS — K5903 Drug induced constipation: Secondary | ICD-10-CM

## 2024-01-30 DIAGNOSIS — Z0000 Encounter for general adult medical examination without abnormal findings: Secondary | ICD-10-CM

## 2024-01-30 DIAGNOSIS — G894 Chronic pain syndrome: Secondary | ICD-10-CM

## 2024-01-30 DIAGNOSIS — E291 Testicular hypofunction: Secondary | ICD-10-CM

## 2024-01-30 DIAGNOSIS — G4700 Insomnia, unspecified: Secondary | ICD-10-CM

## 2024-01-30 DIAGNOSIS — E119 Type 2 diabetes mellitus without complications: Secondary | ICD-10-CM

## 2024-01-30 DIAGNOSIS — F419 Anxiety disorder, unspecified: Secondary | ICD-10-CM | POA: Diagnosis not present

## 2024-01-30 DIAGNOSIS — F32A Depression, unspecified: Secondary | ICD-10-CM | POA: Diagnosis not present

## 2024-01-30 DIAGNOSIS — E1165 Type 2 diabetes mellitus with hyperglycemia: Secondary | ICD-10-CM

## 2024-01-30 DIAGNOSIS — F17210 Nicotine dependence, cigarettes, uncomplicated: Secondary | ICD-10-CM

## 2024-01-30 DIAGNOSIS — H9190 Unspecified hearing loss, unspecified ear: Secondary | ICD-10-CM

## 2024-01-30 DIAGNOSIS — I10 Essential (primary) hypertension: Secondary | ICD-10-CM

## 2024-01-30 DIAGNOSIS — Z794 Long term (current) use of insulin: Secondary | ICD-10-CM

## 2024-01-30 DIAGNOSIS — E785 Hyperlipidemia, unspecified: Secondary | ICD-10-CM | POA: Diagnosis not present

## 2024-01-30 DIAGNOSIS — K219 Gastro-esophageal reflux disease without esophagitis: Secondary | ICD-10-CM | POA: Diagnosis not present

## 2024-01-30 DIAGNOSIS — G8929 Other chronic pain: Secondary | ICD-10-CM

## 2024-01-30 DIAGNOSIS — Z0001 Encounter for general adult medical examination with abnormal findings: Secondary | ICD-10-CM

## 2024-01-30 DIAGNOSIS — T402X5A Adverse effect of other opioids, initial encounter: Secondary | ICD-10-CM | POA: Diagnosis not present

## 2024-01-30 DIAGNOSIS — Z1211 Encounter for screening for malignant neoplasm of colon: Secondary | ICD-10-CM

## 2024-01-30 DIAGNOSIS — N400 Enlarged prostate without lower urinary tract symptoms: Secondary | ICD-10-CM

## 2024-01-30 DIAGNOSIS — Z122 Encounter for screening for malignant neoplasm of respiratory organs: Secondary | ICD-10-CM

## 2024-01-30 LAB — COMPREHENSIVE METABOLIC PANEL
ALT: 18 U/L (ref 0–53)
AST: 13 U/L (ref 0–37)
Albumin: 4.4 g/dL (ref 3.5–5.2)
Alkaline Phosphatase: 84 U/L (ref 39–117)
BUN: 8 mg/dL (ref 6–23)
CO2: 24 meq/L (ref 19–32)
Calcium: 9.6 mg/dL (ref 8.4–10.5)
Chloride: 103 meq/L (ref 96–112)
Creatinine, Ser: 0.92 mg/dL (ref 0.40–1.50)
GFR: 94.81 mL/min (ref >60.00)
Glucose, Bld: 162 mg/dL — ABNORMAL HIGH (ref 70–99)
Potassium: 4 meq/L (ref 3.5–5.1)
Sodium: 139 meq/L (ref 135–145)
Total Bilirubin: 0.6 mg/dL (ref 0.2–1.2)
Total Protein: 7.2 g/dL (ref 6.0–8.3)

## 2024-01-30 LAB — LIPID PANEL
Cholesterol: 163 mg/dL (ref 0–200)
HDL: 30.5 mg/dL — ABNORMAL LOW (ref >39.00)
LDL Cholesterol: 77 mg/dL (ref 0–99)
NonHDL: 132.67
Total CHOL/HDL Ratio: 5
Triglycerides: 278 mg/dL — ABNORMAL HIGH (ref 0.0–149.0)
VLDL: 55.6 mg/dL — ABNORMAL HIGH (ref 0.0–40.0)

## 2024-01-30 LAB — VITAMIN B12: Vitamin B-12: 129 pg/mL — ABNORMAL LOW (ref 211–911)

## 2024-01-30 LAB — RENAL FUNCTION PANEL
Albumin: 4.4 g/dL (ref 3.5–5.2)
BUN: 8 mg/dL (ref 6–23)
CO2: 24 meq/L (ref 19–32)
Calcium: 9.6 mg/dL (ref 8.4–10.5)
Chloride: 103 meq/L (ref 96–112)
Creatinine, Ser: 0.92 mg/dL (ref 0.40–1.50)
GFR: 94.81 mL/min (ref >60.00)
Glucose, Bld: 162 mg/dL — ABNORMAL HIGH (ref 70–99)
Phosphorus: 5.1 mg/dL — ABNORMAL HIGH (ref 2.3–4.6)
Potassium: 4 meq/L (ref 3.5–5.1)
Sodium: 139 meq/L (ref 135–145)

## 2024-01-30 LAB — MICROALBUMIN / CREATININE URINE RATIO
Creatinine,U: 62.7 mg/dL
Microalb Creat Ratio: 22.5 mg/g (ref 0.0–30.0)
Microalb, Ur: 1.4 mg/dL (ref 0.0–1.9)

## 2024-01-30 LAB — HEMOGLOBIN A1C: Hgb A1c MFr Bld: 6.9 % — ABNORMAL HIGH (ref 4.6–6.5)

## 2024-01-30 MED ORDER — ZALEPLON 5 MG PO CAPS: 5.0000 mg | ORAL_CAPSULE | Freq: Every evening | ORAL | 0 refills | Status: DC | PRN

## 2024-01-30 NOTE — Assessment & Plan Note (Signed)
 Deteriorated with situational stress but overall feels well-managed.  Continue citalopram 40 mg daily, bupropion 150 mg twice daily, hydroxyzine 10 mg as needed.

## 2024-01-30 NOTE — Assessment & Plan Note (Signed)
 Following with pulmonology. Controlled.  Continue Advair 250-50 mcg, 1 puff twice daily, albuterol inhaler as needed.

## 2024-01-30 NOTE — Assessment & Plan Note (Signed)
 Following with pain management.  Continue Nucynta 100 mg QID and tizanidine 2 mg TID. Gabapentin discontinued per patient request.

## 2024-01-30 NOTE — Assessment & Plan Note (Signed)
 Following with pain management.  Continue Nucynta 100 mg 4 times daily, tizanidine 2 mg 3 times daily as needed. Gabapentin discontinued for patient request.

## 2024-01-30 NOTE — Assessment & Plan Note (Signed)
 Repeat lipid panel pending. Continue atorvastatin 40 mg daily.

## 2024-01-30 NOTE — Assessment & Plan Note (Signed)
 Deteriorated.  Discontinue Ambien 10 mg at bedtime. Would avoid trazodone given high dose of SSRI and Wellbutrin.  Agreed to try Sonata 5 mg at bedtime.  Prescription sent to pharmacy.

## 2024-01-30 NOTE — Assessment & Plan Note (Signed)
 Immunizations UTD.  Prevnar 20 provided today. Colonoscopy due, he declines but opts for Cologuard.  Orders placed. PSA due and pending.  Discussed the importance of a healthy diet and regular exercise in order for weight loss, and to reduce the risk of further co-morbidity.  Exam stable. Labs pending.  Follow up in 1 year for repeat physical.

## 2024-01-30 NOTE — Assessment & Plan Note (Signed)
 Following with urology

## 2024-01-30 NOTE — Progress Notes (Signed)
 Subjective:   Joseph [name] is a 53 y.o. who presents for a Medicare Wellness preventive visit.  Visit Complete: In person  AWV Questionnaire: No: Patient Medicare AWV questionnaire was not completed prior to this visit.  Cardiac Risk Factors include: dyslipidemia;hypertension;male gender;obesity (BMI >30kg/m2);sedentary lifestyle;smoking/ tobacco exposure;diabetes mellitus    Objective:    Today's Vitals   01/30/24 0920 01/30/24 0925  BP: (!) 150/92   Pulse: 86   Resp: (!) 22   Temp: 98.7 F (37.1 C)   TempSrc: Oral   Weight: 243 lb (110.2 kg)   Height: 5\' 10"  (1.778 m)   PainSc: 10-Worst pain ever 10-Worst pain ever  PainLoc: Knee    Body mass index is 34.87 kg/m.     01/30/2024    9:46 AM 03/01/2022    8:30 PM 01/25/2022    2:57 PM 01/02/2022   11:38 AM 09/03/2021   12:42 PM 09/10/2018   12:58 PM 09/08/2018    9:40 AM  Advanced Directives  Does Patient Have a Medical Advance Directive? No No No No No No No  Would patient like information on creating a medical advance directive?  No - Patient declined  No - Patient declined No - Patient declined No - Patient declined No - Patient declined    Current Medications (verified) Outpatient Encounter Medications as of 01/30/2024  Medication Sig   albuterol (PROVENTIL) (2.5 MG/3ML) 0.083% nebulizer solution Take 3 mLs (2.5 mg total) by nebulization every 4 (four) hours as needed for wheezing or shortness of breath.   atorvastatin (LIPITOR) 40 MG tablet TAKE ONE (1) TABLET BY MOUTH EVERY DAY FOR CHOLESTEROL   blood glucose meter kit and supplies KIT Dispense based on patient and insurance preference. Use up to four times daily as directed. (Dx code E11.65)   buPROPion (WELLBUTRIN SR) 150 MG 12 hr tablet Take 1 tablet (150 mg total) by mouth 2 (two) times daily. for anxiety and depression.   cetirizine (ZYRTEC) 10 MG tablet Take 1 tablet (10 mg total) by mouth daily. For allergies   citalopram (CELEXA) 40 MG tablet Take 1 tablet  (40 mg total) by mouth daily. for anxiety and depression.   Continuous Blood Gluc Sensor (FREESTYLE LIBRE 2 SENSOR) MISC by Does not apply route. Apply sensor every 14 days to monitor sugar continuously. Via [employer] [facility]   empagliflozin (JARDIANCE) 10 MG TABS tablet Take 1 tablet (10 mg total) by mouth daily. for diabetes.   fluticasone-salmeterol (ADVAIR) 250-50 MCG/ACT AEPB Inhale 1 puff into the lungs every 12 (twelve) hours.   gabapentin (NEURONTIN) 300 MG capsule Take 2 capsules (600 mg total) by mouth 2 (two) times daily.   glipiZIDE (GLUCOTROL) 10 MG tablet Take 1 tablet (10 mg total) by mouth 2 (two) times daily before a meal. for diabetes.   glucose blood test strip Use as instructed to test blood sugar up to 4 times daily. (Dx code E11.65)   hydrOXYzine (ATARAX) 10 MG tablet Take 1-2 tablets (10-20 mg total) by mouth 2 (two) times daily as needed. For anxiety.   insulin degludec (TRESIBA FLEXTOUCH) 100 UNIT/ML FlexTouch Pen Inject 35 Units into the skin daily. for diabetes.   Insulin Pen Needle (PEN NEEDLES) 31G X 6 MM MISC Use once to twice daily with insulin.   Ipratropium-Albuterol (COMBIVENT RESPIMAT) 20-100 MCG/ACT AERS respimat One puff every 6 hours as needed   Lancets MISC Use as directed to check blood sugar up to 4 times daily. (Dx code E11.65)   lansoprazole (  PREVACID) 30 MG capsule Take 1 capsule (30 mg total) by mouth 2 (two) times daily before a meal. for heartburn.   losartan (COZAAR) 50 MG tablet TAKE ONE TABLET (50MG  TOTAL) BY MOUTH DAILY FOR BLOOD PRESSURE   metFORMIN (GLUCOPHAGE-XR) 500 MG 24 hr tablet Take 2 tablets (1,000 mg total) by mouth 2 (two) times daily with a meal. for diabetes.   MOVANTIK 25 MG TABS tablet Take 25 mg by mouth every morning.   NUCYNTA 100 MG TABS Take 1 tablet by mouth 4 (four) times daily.   OVER THE COUNTER MEDICATION Take 2 tablets by mouth every morning. Osteo Biflex triple strength with turmeric   OVER THE COUNTER MEDICATION Take 1 tablet  by mouth every morning. Neuriva   tadalafil (CIALIS) 20 MG tablet Take 20 mg by mouth daily as needed for erectile dysfunction.   tamsulosin (FLOMAX) 0.4 MG CAPS capsule TAKE ONE CAPSULE BY MOUTH DAILY   Testosterone 40.5 MG/2.5GM (1.62%) GEL Place 2.5 g onto the skin every morning. 2 pumps - 2.5 g   tirzepatide (MOUNJARO) 2.5 MG/0.5ML Pen Inject 2.5 mg into the skin once a week. for diabetes.   tiZANidine (ZANAFLEX) 2 MG tablet Take 2 mg by mouth 3 (three) times daily.   zolpidem (AMBIEN) 10 MG tablet Take 1 tablet (10 mg total) by mouth at bedtime.   baclofen (LIORESAL) 10 MG tablet Take 10 mg by mouth 3 (three) times daily. (Patient not taking: Reported on 01/30/2024)   No facility-administered encounter medications on file as of 01/30/2024.    Allergies (verified) Keflex [cephalexin], Metformin, and Symbicort [budesonide-formoterol fumarate]   History: Past Medical History:  Diagnosis Date   Acute encephalopathy 03/24/2018   Anxiety    Arthritis    oa left knee and lower back   Asthma    Bucket handle tear of meniscus of knee (Right)    Cataract    hx of bilateral   CHF (congestive heart failure) (HCC)    Chronic back pain    Chronic pain of left knee    COPD (chronic obstructive pulmonary disease) (HCC)    COVID-19 10/31/2021   Diabetes mellitus    type 2   DVT (deep venous thrombosis) (HCC) 01/09/2022   GERD (gastroesophageal reflux disease)    INGUINAL PAIN, LEFT 10/03/2009   Qualifier: Diagnosis of  By: Darrick Penna MD, Sandi L    Medial meniscus, posterior horn derangement 04/22/2014   Oral thrush 11/10/2018   Osteomyelitis (HCC) 02/14/2022   Overdose    03-24-18 accidental   Rash and nonspecific skin eruption 03/12/2017   S/P knee surgery 04/26/2014   Surgery May 22 partial medial meniscectomy   Septic arthritis (HCC) 01/09/2022   Septic arthritis of knee (HCC) 01/25/2022   Septic arthritis of knee, right (HCC) 01/09/2022   Serratia marcescens infection 02/14/2022    Shortness of breath    with heavy exertion   Sleep apnea    Status post unicompartmental knee replacement, left 07/21/2018   Past Surgical History:  Procedure Laterality Date   CARPAL TUNNEL RELEASE Left    CATARACT EXTRACTION W/PHACO Right 03/11/2016   Procedure: CATARACT EXTRACTION PHACO AND INTRAOCULAR LENS PLACEMENT (IOC);  Surgeon: Gemma Payor, MD;  Location: [facility];  Service: Ophthalmology;  Laterality: Right;  CDE 4.24   EYE SURGERY Right 2018   ioc with lens replacement   HERNIA REPAIR Bilateral 2010   umbilical   INCISION AND DRAINAGE ABSCESS Right 01/02/2022   Procedure: RIGHT KNEE ARTHROSCOPIC IRRIGATION AND DEBRIDEMENT;  Surgeon: Joen Laura, MD;  Location: [facility];  Service: Orthopedics;  Laterality: Right;   INCISION AND DRAINAGE ABSCESS Right 01/26/2022   Procedure: INCISION AND DRAINAGE ABSCESS POST OP RIGHT KNEE ARTHROSCOPY;  Surgeon: Joen Laura, MD;  Location: [facility];  Service: Orthopedics;  Laterality: Right;   KNEE ARTHROSCOPY WITH MEDIAL MENISECTOMY Left 04/22/2014   Procedure: LEFT KNEE ARTHROSCOPY WITH MEDIAL MENISECTOMY;  Surgeon: Vickki Hearing, MD;  Location: [facility];  Service: Orthopedics;  Laterality: Left;   KNEE ARTHROSCOPY WITH MEDIAL MENISECTOMY Right 12/06/2015   Procedure: RIGHT KNEE ARTHROSCOPY WITH MEDIAL MENISECTOMY;  Surgeon: Vickki Hearing, MD;  Location: [facility];  Service: Orthopedics;  Laterality: Right;   KNEE ARTHROSCOPY WITH MEDIAL MENISECTOMY Left 03/20/2017   Procedure: KNEE ARTHROSCOPY WITH MEDIAL MENISECTOMY;  Surgeon: Vickki Hearing, MD;  Location: [facility];  Service: Orthopedics;  Laterality: Left;   ORIF WRIST FRACTURE Left 12/19/2016   Procedure: OPEN REDUCTION INTERNAL FIXATION (ORIF) WRIST FRACTURE;  Surgeon: Betha Loa, MD;  Location: [facility];  Service: Orthopedics;  Laterality: Left;  accumed    PARTIAL KNEE ARTHROPLASTY Left 07/21/2018   Procedure: LEFT UNICOMPARTMENTAL KNEE;   Surgeon: Sheral Apley, MD;  Location: [facility];  Service: Orthopedics;  Laterality: Left;   SHOULDER ARTHROCENTESIS Right 2008 or 2010   VASECTOMY  2014   Family History  Problem Relation Age of Onset   Emphysema Maternal Grandfather    Emphysema Maternal Grandmother    Clotting disorder Maternal Grandmother    Social History   Socioeconomic History   Marital status: Divorced    Spouse name: Not on file   Number of children: Not on file   Years of education: Not on file   Highest education level: Not on file  Occupational History   Occupation: disability  Tobacco Use   Smoking status: Every Day    Current packs/day: 2.00    Average packs/day: 2.0 packs/day for 30.1 years (60.2 ttl pk-yrs)    Types: Cigarettes    Start date: 12/17/1993   Smokeless tobacco: Never   Tobacco comments:    States 2 ppd, wants to quit but has had 3 surgeries in 2 months  Vaping Use   Vaping status: Former  Substance and Sexual Activity   Alcohol use: Never    Alcohol/week: 0.0 standard drinks of alcohol   Drug use: Never   Sexual activity: Yes    Birth control/protection: Surgical  Other Topics Concern   Not on file  Social History Narrative   Recent visit to [facility] in [place] d/t increased pain where he was prescribed percocet 7.5-325 mg for pain.   Social Drivers of Health   Financial Resource Strain: High Risk (01/30/2024)   Overall Financial Resource Strain (CARDIA)    Difficulty of Paying Living Expenses: Very hard  Food Insecurity: Food Insecurity Present (01/30/2024)   Hunger Vital Sign    Worried About Running Out of Food in the Last Year: Often true    Ran Out of Food in the Last Year: Often true  Transportation Needs: No Transportation Needs (01/30/2024)   PRAPARE - Administrator, Civil Service (Medical): No    Lack of Transportation (Non-Medical): No  Physical Activity: Inactive (01/30/2024)   Exercise Vital Sign    Days of Exercise per Week: 0 days    Minutes  of Exercise per Session: 0 min  Stress: Stress Concern Present (01/30/2024)   [employer] of Occupational Health -  Occupational Stress Questionnaire    Feeling of Stress : Rather much  Social Connections: Socially Isolated (01/30/2024)   Social Connection and Isolation Panel [NHANES]    Frequency of Communication with Friends and Family: Never    Frequency of Social Gatherings with Friends and Family: Never    Attends Religious Services: Never    Database administrator or Organizations: No    Attends Engineer, structural: Never    Marital Status: Divorced    Tobacco Counseling Ready to quit: Not Answered Counseling given: Not Answered Tobacco comments: States 2 ppd, wants to quit but has had 3 surgeries in 2 months    Clinical Intake:  Pre-visit preparation completed: Yes  Pain : 0-10 Pain Score: 10-Worst pain ever Pain Type: Chronic pain Pain Location: Knee Pain Orientation: Right Pain Descriptors / Indicators: Aching Pain Onset: More than a month ago Pain Frequency: Constant Pain Relieving Factors: percocet  Pain Relieving Factors: percocet  BMI - recorded: 34.87 Nutritional Status: BMI > 30  Obese Nutritional Risks: None Diabetes: Yes CBG done?: Yes (BS via Libre3 is 203 right now) CBG resulted in Enter/ Edit results?: No Did pt. bring in CBG monitor from home?: No  How often do you need to have someone help you when you read instructions, pamphlets, or other written materials from your doctor or pharmacy?: 1 - Never  Interpreter Needed?: No  Comments: pt says he is lost his home and living with a cousin right now Information entered by :: B.Graycen Sadlon,LPN   Activities of Daily Living     01/30/2024    9:46 AM  In your present state of health, do you have any difficulty performing the following activities:  Hearing? 1  Vision? 0  Difficulty concentrating or making decisions? 0  Walking or climbing stairs? 1  Dressing or bathing? 0  Doing  errands, shopping? 0  Preparing Food and eating ? N  Using the Toilet? N  In the past six months, have you accidently leaked urine? N  Do you have problems with loss of bowel control? N  Managing your Medications? N  Managing your Finances? N  Housekeeping or managing your Housekeeping? N    Patient Care Team: Doreene Nest, NP as PCP - General (Internal Medicine) Kathyrn Sheriff, [facility] (Inactive) as Pharmacist (Pharmacist)  Indicate any recent Medical Services you may have received from other than [facility] providers in the past year (date may be approximate).     Assessment:   This is a routine wellness examination for Joseph [name].  Hearing/Vision screen Hearing Screening - Comments:: Pt says his hearing is worse Audiology referral Vision Screening - Comments:: Pt says his vision is good after cataract surgery Does not have eye dr-referral to [facility]   Goals Addressed             This Visit's Progress    Monitor and Manage My Blood Sugar-Diabetes Type 2   On track    Timeframe:  Long-Range Goal Priority:  Medium Start Date:    04/17/22                         Expected End Date:   04/18/23                 Follow Up Date Aug 2023   - check blood sugar at prescribed times using Freestyle Libre - check blood sugar if I feel it is too high or too low -  take the blood sugar meter to all doctor visits    Why is this important?   Checking your blood sugar at home helps to keep it from getting very high or very low.  Writing the results in a diary or log helps the doctor know how to care for you.  Your blood sugar log should have the time, date and the results.  Also, write down the amount of insulin or other medicine that you take.  Other information, like what you ate, exercise done and how you were feeling, will also be helpful.     Notes:      Patient Stated   On track    Starting 05/20/2018, I will continue to take medications as prescribed.        Depression  Screen     01/30/2024    9:39 AM 01/10/2023    1:52 PM 03/05/2022   11:13 AM 02/21/2022    8:10 AM 01/11/2022    2:17 PM 02/23/2021    8:06 AM 09/14/2018   11:14 AM  PHQ 2/9 Scores  PHQ - 2 Score 2 0 0 4 0 0 0  PHQ- 9 Score 8   16  0     Fall Risk     01/30/2024    9:32 AM 01/10/2023    1:52 PM 03/05/2022   11:13 AM 01/11/2022    2:17 PM 06/28/2019    2:30 PM  Fall Risk   Falls in the past year? 0 0 0 0 0  Number falls in past yr: 0 0  0 0  Injury with Fall? 0 0  0 0  Risk for fall due to : No Fall Risks No Fall Risks Impaired balance/gait;Orthopedic patient Impaired balance/gait   Follow up Education provided;Falls prevention discussed Falls evaluation completed Falls evaluation completed Falls evaluation completed     MEDICARE RISK AT HOME:  Medicare Risk at Home Any stairs in or around the home?: Yes If so, are there any without handrails?: Yes Home free of loose throw rugs in walkways, pet beds, electrical cords, etc?: Yes Adequate lighting in your home to reduce risk of falls?: Yes Life alert?: No Use of a cane, walker or w/c?: Yes (cane) Grab bars in the bathroom?: No Shower chair or bench in shower?: No Elevated toilet seat or a handicapped toilet?: No  TIMED UP AND GO:  Was the test performed?  Yes  Length of time to ambulate 10 feet: 15 sec Gait slow and steady with assistive device  Cognitive Function: 6CIT completed    05/20/2018   10:57 AM  MMSE - Mini Mental State Exam  Orientation to time 5  Orientation to Place 5  Registration 3  Attention/ Calculation 0  Recall 0  Recall-comments unable to recall 3 of 3 words  Language- name 2 objects 0  Language- repeat 1  Language- follow 3 step command 3  Language- read & follow direction 0  Write a sentence 0  Copy design 0  Total score 17        01/30/2024    9:47 AM  6CIT Screen  What Year? 0 points  What month? 0 points  What time? 0 points  Count back from 20 0 points  Months in reverse 0 points   Repeat phrase 0 points  Total Score 0 points    Immunizations Immunization History  Administered Date(s) Administered   Influenza Split 09/02/2011   Influenza,inj,Quad PF,6+ Mos 12/01/2014, 08/22/2015, 09/03/2016, 10/21/2017, 11/10/2018, 08/27/2019, 02/23/2021,  10/23/2021   Pneumococcal Polysaccharide-23 12/03/2007, 11/10/2018   Td 10/21/2017   Zoster Recombinant(Shingrix) 07/05/2020, 02/23/2021    Screening Tests Health Maintenance  Topic Date Due   COVID-19 Vaccine (1) Never done   Hepatitis C Screening  Never done   Fecal DNA (Cologuard)  Never done   Pneumococcal Vaccine 73-102 Years old (2 of 2 - PCV) 11/11/2019   Lung Cancer Screening  04/30/2020   INFLUENZA VACCINE  07/03/2023   Diabetic kidney evaluation - eGFR measurement  01/11/2024   Diabetic kidney evaluation - Urine ACR  01/11/2024   HEMOGLOBIN A1C  01/11/2024   OPHTHALMOLOGY EXAM  04/02/2024 (Originally 11/03/2018)   FOOT EXAM  07/10/2024   Medicare Annual Wellness (AWV)  01/29/2025   DTaP/Tdap/Td (2 - Tdap) 10/22/2027   HIV Screening  Completed   Zoster Vaccines- Shingrix  Completed   HPV VACCINES  Aged Out    Health Maintenance  Health Maintenance Due  Topic Date Due   COVID-19 Vaccine (1) Never done   Hepatitis C Screening  Never done   Fecal DNA (Cologuard)  Never done   Pneumococcal Vaccine 74-71 Years old (2 of 2 - PCV) 11/11/2019   Lung Cancer Screening  04/30/2020   INFLUENZA VACCINE  07/03/2023   Diabetic kidney evaluation - eGFR measurement  01/11/2024   Diabetic kidney evaluation - Urine ACR  01/11/2024   HEMOGLOBIN A1C  01/11/2024   Health Maintenance Items Addressed: Prevnar 20 given  Additional Screening:  Vision Screening: Recommended annual ophthalmology exams for early detection of glaucoma and other disorders of the eye.  Dental Screening: Recommended annual dental exams for proper oral hygiene  Community Resource Referral / Chronic Care Management: CRR required this visit?   No   CCM required this visit?  No     Plan:     I have personally reviewed and noted the following in the patient's chart:   Medical and social history Use of alcohol, tobacco or illicit drugs  Current medications and supplements including opioid prescriptions. Patient is not currently taking opioid prescriptions. Functional ability and status Nutritional status Physical activity Advanced directives List of other physicians Hospitalizations, surgeries, and ER visits in previous 12 months Vitals Screenings to include cognitive, depression, and falls Referrals and appointments  In addition, I have reviewed and discussed with patient certain preventive protocols, quality metrics, and best practice recommendations. A written personalized care plan for preventive services as well as general preventive health recommendations were provided to patient.    Sue Lush, LPN   [id number]   After Visit Summary: (In Person-Printed) AVS printed and given to the patient  Notes:  Pt presents ambulating with a cane: reporting having a hard time financially and medically. He sts his daughter put him out and he is homeless (staying with a cousin in [place] right now). H e is pt at [facility] pain clinic, in which he says he for a recent rx for Percocet for pain in rt knee (describes a 10). Pt to see PCP after this visit. Pt BP a little elevated (says lots of pain and talking continuous and strongly). Encouraged to relax so BP can come down for next reading with PCP.  He relays the Cologard previously sent got destroyed in his move (re-ordered).

## 2024-01-30 NOTE — Assessment & Plan Note (Addendum)
 Above goal today, also during prior office visits. Uncontrolled pain and increased stress.  Historically, he does have normal blood pressure readings with regimen losartan 50 mg daily.   I have asked that he start checking blood pressures at home and follow-up in our office in 2 to 3 weeks for blood pressure check. Continue losartan 50 mg daily for now.  If blood pressure above goal next visit then will adjust his regimen.

## 2024-01-30 NOTE — Patient Instructions (Addendum)
 Stop by the lab prior to leaving today. I will notify you of your results once received.   Complete the Cologuard colon cancer screening kit once received.  Stop taking Ambien for sleep.  You may try zaleplon 5 mg at bedtime for sleep.  Please schedule a follow up visit to meet back with me in 2-3 weeks for blood pressure check.   It was a pleasure to see you today!

## 2024-01-30 NOTE — Assessment & Plan Note (Signed)
 Following with urology.  Continue tamsulosin 0.4 mg daily. PSA pending.

## 2024-01-30 NOTE — Patient Instructions (Signed)
 [name] , Thank you for taking time to come for your Medicare Wellness Visit. I appreciate your ongoing commitment to your health goals. Please review the following plan we discussed and let me know if I can assist you in the future.   Referrals/Orders/Follow-Ups/Clinician Recommendations:   An order has been placed for a Cologuard for you. They will mail you the kit with instructions on how to obtain the sample and send it back in to be tested. If you do not received your kit, please call our office and let us know.    Flu Vaccine: Patient declined flu vaccine Due, Education has been provided regarding the importance of this vaccine. Advised may receive this vaccine at local pharmacy or Health Dept. Aware to provide a copy of the vaccination record if obtained from local pharmacy or Health Dept. Verbalized acceptance and understanding.   You have been referred to [facility] for a complete eye exam. If you haven't heard from them in a few days, please call them to schedule your appointment.  [facility] [address] [place] [address] Phone: [phone]   Patient complains of difficulty with hearing. ENT referral placed. Patient is in agreement with treatment plan. Aware that the office will call with an appointment.     This is a list of the screening recommended for you and due dates:  Health Maintenance  Topic Date Due   COVID-19 Vaccine (1) Never done   Hepatitis C Screening  Never done   Cologuard (Stool DNA test)  Never done   Pneumococcal Vaccination (2 of 2 - PCV) 11/11/2019   Screening for Lung Cancer  04/30/2020   Flu Shot  07/03/2023   Yearly kidney function blood test for diabetes  01/11/2024   Yearly kidney health urinalysis for diabetes  01/11/2024   Hemoglobin A1C  01/11/2024   Eye exam for diabetics  04/02/2024*   Complete foot exam   07/10/2024   Medicare Annual Wellness Visit  01/29/2025   DTaP/Tdap/Td vaccine (2 - Tdap) 10/22/2027   HIV  Screening  Completed   Zoster (Shingles) Vaccine  Completed   HPV Vaccine  Aged Out  *Topic was postponed. The date shown is not the original due date.    Advanced directives: (Declined) Advance directive discussed with you today. Even though you declined this today, please call our office should you change your mind, and we can give you the proper paperwork for you to fill out.  Next Medicare Annual Wellness Visit scheduled for next year: Yes 02/01/25 @ 9:30am in person

## 2024-01-30 NOTE — Assessment & Plan Note (Signed)
 Controlled.  Continue Linzess.  He will update with the dose as this was prescribed by his pain management doctor.

## 2024-01-30 NOTE — Assessment & Plan Note (Signed)
 Repeat A1c pending.  Continue Tresiba 35 units daily, metformin ER 1000 mg twice daily, glipizide 10 mg twice daily, Jardiance 10 mg daily. Unable to tolerate Mounjaro.  Consider Ozempic if covered by insurance.  Urine micro albumin done pending.  Follow-up in 3 to 6 months based on A1c results.

## 2024-01-30 NOTE — Assessment & Plan Note (Signed)
 Controlled.  Continue lansoprazole 30 mg twice daily.

## 2024-01-30 NOTE — Assessment & Plan Note (Signed)
 Recommended lung cancer screening program today.  He declines but will update when he is ready.

## 2024-01-30 NOTE — Progress Notes (Signed)
 Subjective:    Patient ID: Joseph [name], male    DOB: 09-25-1971, 53 y.o.   MRN: [id number]  HPI  Joseph [name] is a very pleasant 53 y.o. male who presents today for complete physical and follow up of chronic conditions.  He would also like to discuss chronic insomnia. Chronic for years. Currently managed on Ambien 10 mg for which he has been taking for years. He has difficulty staying asleep and falling asleep. His attributes his insomnia to his chronic pain. Also with mind racing thoughts. He is managed on Wellbutrin 150 mg BID and citalopram 40 mg daily which helps for chronic anxiety and depression. He takes Nucynta 100 mg and tizanidine HS along with Ambien and will sometimes fall asleep. He questions if Kathaleen Bury will help.   Immunizations: -Tetanus: Completed in 2018 -Influenza: Declines influenza vaccine.  -Shingles: Completed Shingrix series -Pneumonia: Completed pneumovax in 2019  Diet: Fair diet.  Exercise: No regular exercise.  Eye exam: Completed > 1 year ago Dental exam: Completed >1 year ago   Colonoscopy: Completed >10 years ago.  Lung Cancer Screening: Smoker since the age of 51, smokes 3/4 pack to 2 PPD. Never completed program. Declines.   PSA: Due   BP Readings from Last 3 Encounters:  01/30/24 (!) 170/94  01/30/24 (!) 150/92  07/11/23 122/80    He has not taken his BP medications today. He is under a lot of stress. He does not check his BP at home.     Review of Systems  Constitutional:  Negative for unexpected weight change.  HENT:  Negative for rhinorrhea.   Respiratory:  Negative for cough and shortness of breath.   Cardiovascular:  Negative for chest pain.  Gastrointestinal:  Negative for constipation and diarrhea.  Genitourinary:  Negative for difficulty urinating.  Musculoskeletal:  Positive for arthralgias.  Skin:  Negative for rash.  Allergic/Immunologic: Negative for environmental allergies.  Neurological:  Negative for dizziness and  headaches.  Psychiatric/Behavioral:  Positive for sleep disturbance.          Past Medical History:  Diagnosis Date   Acute encephalopathy 03/24/2018   Anxiety    Arthritis    oa left knee and lower back   Asthma    Bucket handle tear of meniscus of knee (Right)    Cataract    hx of bilateral   CHF (congestive heart failure) (HCC)    Chronic back pain    Chronic pain of left knee    COPD (chronic obstructive pulmonary disease) (HCC)    COVID-19 10/31/2021   Diabetes mellitus    type 2   DVT (deep venous thrombosis) (HCC) 01/09/2022   GERD (gastroesophageal reflux disease)    INGUINAL PAIN, LEFT 10/03/2009   Qualifier: Diagnosis of  By: Darrick Penna MD, Sandi L    Medial meniscus, posterior horn derangement 04/22/2014   Oral thrush 11/10/2018   Osteomyelitis (HCC) 02/14/2022   Overdose    03-24-18 accidental   Rash and nonspecific skin eruption 03/12/2017   S/P knee surgery 04/26/2014   Surgery May 22 partial medial meniscectomy   Septic arthritis (HCC) 01/09/2022   Septic arthritis of knee (HCC) 01/25/2022   Septic arthritis of knee, right (HCC) 01/09/2022   Serratia marcescens infection 02/14/2022   Shortness of breath    with heavy exertion   Sleep apnea    Status post unicompartmental knee replacement, left 07/21/2018   Tinea corporis 01/07/2018   Tinea versicolor 01/07/2018    Social History  Socioeconomic History   Marital status: Divorced    Spouse name: Not on file   Number of children: Not on file   Years of education: Not on file   Highest education level: Not on file  Occupational History   Occupation: disability  Tobacco Use   Smoking status: Every Day    Current packs/day: 2.00    Average packs/day: 2.0 packs/day for 30.1 years (60.2 ttl pk-yrs)    Types: Cigarettes    Start date: 12/17/1993   Smokeless tobacco: Never   Tobacco comments:    States 2 ppd, wants to quit but has had 3 surgeries in 2 months  Vaping Use   Vaping status: Former   Substance and Sexual Activity   Alcohol use: Never    Alcohol/week: 0.0 standard drinks of alcohol   Drug use: Never   Sexual activity: Yes    Birth control/protection: Surgical  Other Topics Concern   Not on file  Social History Narrative   Recent visit to [facility] in [place] d/t increased pain where he was prescribed percocet 7.5-325 mg for pain.   Social Drivers of Health   Financial Resource Strain: High Risk (01/30/2024)   Overall Financial Resource Strain (CARDIA)    Difficulty of Paying Living Expenses: Very hard  Food Insecurity: Food Insecurity Present (01/30/2024)   Hunger Vital Sign    Worried About Running Out of Food in the Last Year: Often true    Ran Out of Food in the Last Year: Often true  Transportation Needs: No Transportation Needs (01/30/2024)   PRAPARE - Administrator, Civil Service (Medical): No    Lack of Transportation (Non-Medical): No  Physical Activity: Inactive (01/30/2024)   Exercise Vital Sign    Days of Exercise per Week: 0 days    Minutes of Exercise per Session: 0 min  Stress: Stress Concern Present (01/30/2024)   [employer] of Occupational Health - Occupational Stress Questionnaire    Feeling of Stress : Rather much  Social Connections: Socially Isolated (01/30/2024)   Social Connection and Isolation Panel [NHANES]    Frequency of Communication with Friends and Family: Never    Frequency of Social Gatherings with Friends and Family: Never    Attends Religious Services: Never    Database administrator or Organizations: No    Attends Banker Meetings: Never    Marital Status: Divorced  Catering manager Violence: Not At Risk (01/30/2024)   Humiliation, Afraid, Rape, and Kick questionnaire    Fear of Current or Ex-Partner: No    Emotionally Abused: No    Physically Abused: No    Sexually Abused: No    Past Surgical History:  Procedure Laterality Date   CARPAL TUNNEL RELEASE Left    CATARACT EXTRACTION W/PHACO  Right 03/11/2016   Procedure: CATARACT EXTRACTION PHACO AND INTRAOCULAR LENS PLACEMENT (IOC);  Surgeon: Gemma Payor, MD;  Location: [facility];  Service: Ophthalmology;  Laterality: Right;  CDE 4.24   EYE SURGERY Right 2018   ioc with lens replacement   HERNIA REPAIR Bilateral 2010   umbilical   INCISION AND DRAINAGE ABSCESS Right 01/02/2022   Procedure: RIGHT KNEE ARTHROSCOPIC IRRIGATION AND DEBRIDEMENT;  Surgeon: Joen Laura, MD;  Location: [facility];  Service: Orthopedics;  Laterality: Right;   INCISION AND DRAINAGE ABSCESS Right 01/26/2022   Procedure: INCISION AND DRAINAGE ABSCESS POST OP RIGHT KNEE ARTHROSCOPY;  Surgeon: Joen Laura, MD;  Location: [facility];  Service: Orthopedics;  Laterality:  Right;   KNEE ARTHROSCOPY WITH MEDIAL MENISECTOMY Left 04/22/2014   Procedure: LEFT KNEE ARTHROSCOPY WITH MEDIAL MENISECTOMY;  Surgeon: Vickki Hearing, MD;  Location: [facility];  Service: Orthopedics;  Laterality: Left;   KNEE ARTHROSCOPY WITH MEDIAL MENISECTOMY Right 12/06/2015   Procedure: RIGHT KNEE ARTHROSCOPY WITH MEDIAL MENISECTOMY;  Surgeon: Vickki Hearing, MD;  Location: [facility];  Service: Orthopedics;  Laterality: Right;   KNEE ARTHROSCOPY WITH MEDIAL MENISECTOMY Left 03/20/2017   Procedure: KNEE ARTHROSCOPY WITH MEDIAL MENISECTOMY;  Surgeon: Vickki Hearing, MD;  Location: [facility];  Service: Orthopedics;  Laterality: Left;   ORIF WRIST FRACTURE Left 12/19/2016   Procedure: OPEN REDUCTION INTERNAL FIXATION (ORIF) WRIST FRACTURE;  Surgeon: Betha Loa, MD;  Location: [facility];  Service: Orthopedics;  Laterality: Left;  accumed    PARTIAL KNEE ARTHROPLASTY Left 07/21/2018   Procedure: LEFT UNICOMPARTMENTAL KNEE;  Surgeon: Sheral Apley, MD;  Location: [facility];  Service: Orthopedics;  Laterality: Left;   SHOULDER ARTHROCENTESIS Right 2008 or 2010   VASECTOMY  2014    Family History  Problem Relation Age of Onset   Emphysema Maternal Grandfather     Emphysema Maternal Grandmother    Clotting disorder Maternal Grandmother     Allergies  Allergen Reactions   Keflex [Cephalexin] Nausea Only   Metformin Nausea And Vomiting   Symbicort [Budesonide-Formoterol Fumarate] Other (See Comments)    States that 3 doses were used and breathing became worse    Current Outpatient Medications on File Prior to Visit  Medication Sig Dispense Refill   albuterol (PROVENTIL) (2.5 MG/3ML) 0.083% nebulizer solution Take 3 mLs (2.5 mg total) by nebulization every 4 (four) hours as needed for wheezing or shortness of breath. 360 mL 2   atorvastatin (LIPITOR) 40 MG tablet TAKE ONE (1) TABLET BY MOUTH EVERY DAY FOR CHOLESTEROL 90 tablet 2   blood glucose meter kit and supplies KIT Dispense based on patient and insurance preference. Use up to four times daily as directed. (Dx code E11.65) 1 each 0   buPROPion (WELLBUTRIN SR) 150 MG 12 hr tablet Take 1 tablet (150 mg total) by mouth 2 (two) times daily. for anxiety and depression. 180 tablet 0   cetirizine (ZYRTEC) 10 MG tablet Take 1 tablet (10 mg total) by mouth daily. For allergies 90 tablet 1   citalopram (CELEXA) 40 MG tablet Take 1 tablet (40 mg total) by mouth daily. for anxiety and depression. 90 tablet 0   Continuous Blood Gluc Sensor (FREESTYLE LIBRE 2 SENSOR) MISC by Does not apply route. Apply sensor every 14 days to monitor sugar continuously. Via [facility]     empagliflozin (JARDIANCE) 10 MG TABS tablet Take 1 tablet (10 mg total) by mouth daily. for diabetes. 90 tablet 0   fluticasone-salmeterol (ADVAIR) 250-50 MCG/ACT AEPB Inhale 1 puff into the lungs every 12 (twelve) hours. 60 each 11   glipiZIDE (GLUCOTROL) 10 MG tablet Take 1 tablet (10 mg total) by mouth 2 (two) times daily before a meal. for diabetes. 180 tablet 0   glucose blood test strip Use as instructed to test blood sugar up to 4 times daily. (Dx code E11.65) 100 each 12   hydrOXYzine (ATARAX) 10 MG tablet Take 1-2 tablets (10-20 mg  total) by mouth 2 (two) times daily as needed. For anxiety. 180 tablet 0   insulin degludec (TRESIBA FLEXTOUCH) 100 UNIT/ML FlexTouch Pen Inject 35 Units into the skin daily. for diabetes. 30 mL 0   Ipratropium-Albuterol (COMBIVENT RESPIMAT)  20-100 MCG/ACT AERS respimat One puff every 6 hours as needed 4 g 5   Lancets MISC Use as directed to check blood sugar up to 4 times daily. (Dx code E11.65) 100 each 3   lansoprazole (PREVACID) 30 MG capsule Take 1 capsule (30 mg total) by mouth 2 (two) times daily before a meal. for heartburn. 180 capsule 0   losartan (COZAAR) 50 MG tablet TAKE ONE TABLET (50MG  TOTAL) BY MOUTH DAILY FOR BLOOD PRESSURE 90 tablet 1   metFORMIN (GLUCOPHAGE-XR) 500 MG 24 hr tablet Take 2 tablets (1,000 mg total) by mouth 2 (two) times daily with a meal. for diabetes. 360 tablet 0   NUCYNTA 100 MG TABS Take 1 tablet by mouth 4 (four) times daily.     OVER THE COUNTER MEDICATION Take 2 tablets by mouth every morning. Osteo Biflex triple strength with turmeric     OVER THE COUNTER MEDICATION Take 1 tablet by mouth every morning. Neuriva     tamsulosin (FLOMAX) 0.4 MG CAPS capsule TAKE ONE CAPSULE BY MOUTH DAILY 90 capsule 1   tiZANidine (ZANAFLEX) 2 MG tablet Take 2 mg by mouth 3 (three) times daily.     Insulin Pen Needle (PEN NEEDLES) 31G X 6 MM MISC Use once to twice daily with insulin. 100 each 1   tadalafil (CIALIS) 20 MG tablet Take 20 mg by mouth daily as needed for erectile dysfunction. (Patient not taking: Reported on 01/30/2024)     No current facility-administered medications on file prior to visit.    BP (!) 170/94   Pulse 97   Temp 98.1 F (36.7 C) (Temporal)   Ht 5\' 10"  (1.778 m)   Wt 243 lb (110.2 kg)   SpO2 97%   BMI 34.87 kg/m  Objective:   Physical Exam HENT:     Right Ear: Tympanic membrane and ear canal normal.     Left Ear: Tympanic membrane and ear canal normal.  Eyes:     Pupils: Pupils are equal, round, and reactive to light.  Cardiovascular:      Rate and Rhythm: Normal rate and regular rhythm.  Pulmonary:     Effort: Pulmonary effort is normal.     Breath sounds: Normal breath sounds.  Abdominal:     General: Bowel sounds are normal.     Palpations: Abdomen is soft.     Tenderness: There is no abdominal tenderness.  Musculoskeletal:        General: Normal range of motion.     Cervical back: Neck supple.  Skin:    General: Skin is warm and dry.  Neurological:     Mental Status: He is alert and oriented to person, place, and time.     Cranial Nerves: No cranial nerve deficit.     Deep Tendon Reflexes:     Reflex Scores:      Patellar reflexes are 2+ on the right side and 2+ on the left side. Psychiatric:        Mood and Affect: Mood normal.           Assessment & Plan:  Encounter for annual general medical examination with abnormal findings in adult Assessment & Plan: Immunizations UTD.  Prevnar 20 provided today. Colonoscopy due, he declines but opts for Cologuard.  Orders placed. PSA due and pending.  Discussed the importance of a healthy diet and regular exercise in order for weight loss, and to reduce the risk of further co-morbidity.  Exam stable. Labs pending.  Follow up  in 1 year for repeat physical.    Type 2 diabetes mellitus with hyperglycemia, with long-term current use of insulin (HCC) -     Microalbumin / creatinine urine ratio  Insomnia, unspecified type Assessment & Plan: Deteriorated.  Discontinue Ambien 10 mg at bedtime. Would avoid trazodone given high dose of SSRI and Wellbutrin.  Agreed to try Sonata 5 mg at bedtime.  Prescription sent to pharmacy.  Orders: -     Zaleplon; Take 1 capsule (5 mg total) by mouth at bedtime as needed for sleep.  Dispense: 90 capsule; Refill: 0  Essential hypertension Assessment & Plan: Above goal today, also during prior office visits. Uncontrolled pain and increased stress.  Historically, he does have normal blood pressure readings with  regimen losartan 50 mg daily.   I have asked that he start checking blood pressures at home and follow-up in our office in 2 to 3 weeks for blood pressure check. Continue losartan 50 mg daily for now.  If blood pressure above goal next visit then will adjust his regimen.   Chronic obstructive pulmonary disease, unspecified COPD type (HCC) Assessment & Plan: Following with pulmonology. Controlled.  Continue Advair 250-50 mcg, 1 puff twice daily, albuterol inhaler as needed.   Gastroesophageal reflux disease, unspecified whether esophagitis present Assessment & Plan: Controlled.  Continue lansoprazole 30 mg twice daily.   Opioid-induced constipation (OIC) Assessment & Plan: Controlled.  Continue Linzess.  He will update with the dose as this was prescribed by his pain management doctor.   Male hypogonadism Assessment & Plan: Following with urology.   Benign prostatic hyperplasia without lower urinary tract symptoms Assessment & Plan: Following with urology.  Continue tamsulosin 0.4 mg daily. PSA pending.   Anxiety and depression Assessment & Plan: Deteriorated with situational stress but overall feels well-managed.  Continue citalopram 40 mg daily, bupropion 150 mg twice daily, hydroxyzine 10 mg as needed.   Chronic knee pain (Primary Area of Pain) (Bilateral) (L>R) Assessment & Plan: Following with pain management.  Continue Nucynta 100 mg QID and tizanidine 2 mg TID. Gabapentin discontinued per patient request.   Chronic pain syndrome Assessment & Plan: Following with pain management.  Continue Nucynta 100 mg 4 times daily, tizanidine 2 mg 3 times daily as needed. Gabapentin discontinued for patient request.   Cigarette smoker Assessment & Plan: Recommended lung cancer screening program today.  He declines but will update when he is ready.   Dyslipidemia Assessment & Plan:  Repeat lipid panel pending. Continue atorvastatin 40 mg  daily.         Doreene Nest, NP

## 2024-02-02 DIAGNOSIS — E291 Testicular hypofunction: Secondary | ICD-10-CM | POA: Diagnosis not present

## 2024-02-03 NOTE — Telephone Encounter (Signed)
 Joseph [name], can you put a handicap placard request for this patient in my inbox?

## 2024-02-10 ENCOUNTER — Other Ambulatory Visit: Payer: Self-pay | Admitting: Nurse Practitioner

## 2024-02-10 DIAGNOSIS — I10 Essential (primary) hypertension: Secondary | ICD-10-CM

## 2024-02-10 MED ORDER — LOSARTAN POTASSIUM 50 MG PO TABS: 50.0000 mg | ORAL_TABLET | Freq: Every day | ORAL | 0 refills | Status: DC

## 2024-02-11 MED ORDER — LOSARTAN POTASSIUM 50 MG PO TABS: 50.0000 mg | ORAL_TABLET | Freq: Every day | ORAL | 0 refills | Status: DC

## 2024-02-11 NOTE — Telephone Encounter (Signed)
 Can we call and cancel the losartan that was sent to the [place] [facility]

## 2024-02-11 NOTE — Telephone Encounter (Signed)
 Canceled losartan at [facility]

## 2024-02-11 NOTE — Addendum Note (Signed)
 Addended by: Eden Emms on: 02/11/2024 01:47 PM   Modules accepted: Orders

## 2024-02-26 DIAGNOSIS — E119 Type 2 diabetes mellitus without complications: Secondary | ICD-10-CM | POA: Diagnosis not present

## 2024-02-26 DIAGNOSIS — T402X5A Adverse effect of other opioids, initial encounter: Secondary | ICD-10-CM | POA: Diagnosis not present

## 2024-02-26 DIAGNOSIS — E559 Vitamin D deficiency, unspecified: Secondary | ICD-10-CM | POA: Diagnosis not present

## 2024-02-26 DIAGNOSIS — M1990 Unspecified osteoarthritis, unspecified site: Secondary | ICD-10-CM | POA: Diagnosis not present

## 2024-02-26 DIAGNOSIS — K5903 Drug induced constipation: Secondary | ICD-10-CM | POA: Diagnosis not present

## 2024-02-26 DIAGNOSIS — Z79899 Other long term (current) drug therapy: Secondary | ICD-10-CM | POA: Diagnosis not present

## 2024-02-26 DIAGNOSIS — G894 Chronic pain syndrome: Secondary | ICD-10-CM | POA: Diagnosis not present

## 2024-02-26 DIAGNOSIS — E6609 Other obesity due to excess calories: Secondary | ICD-10-CM | POA: Diagnosis not present

## 2024-02-26 DIAGNOSIS — R030 Elevated blood-pressure reading, without diagnosis of hypertension: Secondary | ICD-10-CM | POA: Diagnosis not present

## 2024-02-26 DIAGNOSIS — E7800 Pure hypercholesterolemia, unspecified: Secondary | ICD-10-CM | POA: Diagnosis not present

## 2024-02-26 DIAGNOSIS — M25561 Pain in right knee: Secondary | ICD-10-CM | POA: Diagnosis not present

## 2024-03-01 ENCOUNTER — Other Ambulatory Visit: Payer: Self-pay

## 2024-03-01 DIAGNOSIS — G4700 Insomnia, unspecified: Secondary | ICD-10-CM

## 2024-03-01 DIAGNOSIS — Z79899 Other long term (current) drug therapy: Secondary | ICD-10-CM | POA: Diagnosis not present

## 2024-03-01 DIAGNOSIS — F32A Depression, unspecified: Secondary | ICD-10-CM

## 2024-03-01 MED ORDER — CITALOPRAM HYDROBROMIDE 40 MG PO TABS: 40.0000 mg | ORAL_TABLET | Freq: Every day | ORAL | 2 refills | Status: DC

## 2024-03-01 MED ORDER — ZOLPIDEM TARTRATE 10 MG PO TABS: 10.0000 mg | ORAL_TABLET | Freq: Every evening | ORAL | 0 refills | Status: DC | PRN

## 2024-03-01 NOTE — Telephone Encounter (Signed)
Noted. Refill(s) sent to pharmacy.  

## 2024-03-01 NOTE — Telephone Encounter (Signed)
 Unable to reach patient. Left voicemail to return call to our office.

## 2024-03-01 NOTE — Telephone Encounter (Signed)
 Please call patient:  Which pharmacy is he using now?

## 2024-03-01 NOTE — Telephone Encounter (Signed)
 See mychart message form patient:  Joseph [name] I've moved back to [place] so I'm sending everything to [place] pharmacy not going to be using [facility] in [place] anymore also need to get a refill on ambien the other pill doesn't work and I have stopped taking flomax now

## 2024-03-10 LAB — COLOGUARD

## 2024-03-14 DIAGNOSIS — I10 Essential (primary) hypertension: Secondary | ICD-10-CM

## 2024-03-14 DIAGNOSIS — E1165 Type 2 diabetes mellitus with hyperglycemia: Secondary | ICD-10-CM

## 2024-03-15 MED ORDER — LOSARTAN POTASSIUM 50 MG PO TABS: 50.0000 mg | ORAL_TABLET | Freq: Every day | ORAL | 2 refills | Status: DC

## 2024-03-15 MED ORDER — EMPAGLIFLOZIN 10 MG PO TABS: 10.0000 mg | ORAL_TABLET | Freq: Every day | ORAL | 1 refills | Status: DC

## 2024-03-22 ENCOUNTER — Telehealth: Payer: Self-pay

## 2024-03-22 DIAGNOSIS — J45909 Unspecified asthma, uncomplicated: Secondary | ICD-10-CM

## 2024-03-22 MED ORDER — FLUTICASONE FUROATE-VILANTEROL 200-25 MCG/ACT IN AEPB: 1.0000 | INHALATION_SPRAY | Freq: Every morning | RESPIRATORY_TRACT | 11 refills | Status: DC

## 2024-03-22 NOTE — Telephone Encounter (Signed)
 Received a fax from pt pharmacy for request of medication change due to insurance. Verbal and written from provider to change ADVAIR to [facility]

## 2024-03-29 DIAGNOSIS — Z79899 Other long term (current) drug therapy: Secondary | ICD-10-CM | POA: Diagnosis not present

## 2024-03-29 DIAGNOSIS — Z125 Encounter for screening for malignant neoplasm of prostate: Secondary | ICD-10-CM | POA: Diagnosis not present

## 2024-03-29 DIAGNOSIS — G8929 Other chronic pain: Secondary | ICD-10-CM | POA: Diagnosis not present

## 2024-03-29 DIAGNOSIS — R030 Elevated blood-pressure reading, without diagnosis of hypertension: Secondary | ICD-10-CM | POA: Diagnosis not present

## 2024-03-29 DIAGNOSIS — E6609 Other obesity due to excess calories: Secondary | ICD-10-CM | POA: Diagnosis not present

## 2024-03-29 DIAGNOSIS — E559 Vitamin D deficiency, unspecified: Secondary | ICD-10-CM | POA: Diagnosis not present

## 2024-03-29 DIAGNOSIS — E119 Type 2 diabetes mellitus without complications: Secondary | ICD-10-CM | POA: Diagnosis not present

## 2024-03-29 DIAGNOSIS — R0602 Shortness of breath: Secondary | ICD-10-CM | POA: Diagnosis not present

## 2024-03-29 DIAGNOSIS — M25561 Pain in right knee: Secondary | ICD-10-CM | POA: Diagnosis not present

## 2024-03-29 DIAGNOSIS — T402X5A Adverse effect of other opioids, initial encounter: Secondary | ICD-10-CM | POA: Diagnosis not present

## 2024-03-29 DIAGNOSIS — M1990 Unspecified osteoarthritis, unspecified site: Secondary | ICD-10-CM | POA: Diagnosis not present

## 2024-03-29 DIAGNOSIS — K5903 Drug induced constipation: Secondary | ICD-10-CM | POA: Diagnosis not present

## 2024-03-29 DIAGNOSIS — E7800 Pure hypercholesterolemia, unspecified: Secondary | ICD-10-CM | POA: Diagnosis not present

## 2024-03-30 DIAGNOSIS — E1165 Type 2 diabetes mellitus with hyperglycemia: Secondary | ICD-10-CM

## 2024-03-30 DIAGNOSIS — F32A Depression, unspecified: Secondary | ICD-10-CM

## 2024-04-01 ENCOUNTER — Ambulatory Visit: Payer: Self-pay

## 2024-04-01 NOTE — Telephone Encounter (Signed)
 Patient was evaluated in February 2025 for the same and was asked to follow-up 2 to 3 weeks later for blood pressure check for which he did not.  Will evaluate as scheduled.

## 2024-04-01 NOTE — Telephone Encounter (Signed)
 Copied from CRM [id number]. Topic: Clinical - Red Word Triage >> Apr 01, 2024  7:57 AM Joseph [name] wrote: Red Word that prompted transfer to Nurse Triage: patient calling in. In communication with Polly Brink through MyChart  was told to make an appt  BP this morning 164/98 patient feels week , feels drunk when walking.   Chief Complaint: Blood Pressure-High  Symptoms: Weakness, Fatigues, Headaches  Frequency: Acute  Pertinent Negatives: Patient denies chest pain, dyspnea, visual changes, numbness, or tingling.   Disposition: [] ED /[] Urgent Care (no appt availability in office) / [x] Appointment(In office/virtual)/ []  [facility]/ [] Home Care/ [] Refused Recommended Disposition /[] [place] [facility]/ []  Follow-up with PCP  Additional Notes: JG is being triaged for an elevated blood pressure elevations that occurred overnight. The patient took his blood pressure at home last night and this morning, both readings were elevated.  In addition the patient reports headaches, fatigue, and feeling weak intermittently. Reports having surgery in the past and having issues with his blood pressure since then. In office appointment made with PCP for tomorrow morning.   Reason for Disposition  Systolic BP  >= 160 OR Diastolic >= 100  Answer Assessment - Initial Assessment Questions 1. BLOOD PRESSURE: "What is the blood pressure?" "Did you take at least two measurements 5 minutes apart?"     177/100, 164/98  2. ONSET: "When did you take your blood pressure?"      This morning  3. HOW: "How did you take your blood pressure?" (e.g., automatic home BP monitor, visiting nurse)     Automatic Blood Pressure Cuff  4. HISTORY: "Do you have a history of high blood pressure?"     No  5. MEDICINES: "Are you taking any medicines for blood pressure?" "Have you missed any doses recently?"     No  6. OTHER SYMPTOMS: "Do you have any symptoms?" (e.g., blurred vision, chest pain, difficulty breathing, headache,  weakness)     Weakness, Fatigue, Headaches  Protocols used: Blood Pressure - High-A-AH

## 2024-04-02 ENCOUNTER — Encounter: Payer: Self-pay | Admitting: Primary Care

## 2024-04-02 ENCOUNTER — Ambulatory Visit (INDEPENDENT_AMBULATORY_CARE_PROVIDER_SITE_OTHER): Admitting: Primary Care

## 2024-04-02 VITALS — BP 152/88 | HR 95 | Temp 98.3°F | Ht 70.0 in | Wt 246.0 lb

## 2024-04-02 DIAGNOSIS — J302 Other seasonal allergic rhinitis: Secondary | ICD-10-CM | POA: Diagnosis not present

## 2024-04-02 DIAGNOSIS — I10 Essential (primary) hypertension: Secondary | ICD-10-CM | POA: Diagnosis not present

## 2024-04-02 DIAGNOSIS — Z5941 Food insecurity: Secondary | ICD-10-CM | POA: Insufficient documentation

## 2024-04-02 DIAGNOSIS — Z79899 Other long term (current) drug therapy: Secondary | ICD-10-CM | POA: Diagnosis not present

## 2024-04-02 DIAGNOSIS — K219 Gastro-esophageal reflux disease without esophagitis: Secondary | ICD-10-CM | POA: Diagnosis not present

## 2024-04-02 DIAGNOSIS — Z59819 Housing instability, housed unspecified: Secondary | ICD-10-CM | POA: Insufficient documentation

## 2024-04-02 MED ORDER — LANSOPRAZOLE 30 MG PO CPDR: 30.0000 mg | DELAYED_RELEASE_CAPSULE | Freq: Two times a day (BID) | ORAL | 2 refills | Status: DC

## 2024-04-02 MED ORDER — CETIRIZINE HCL 10 MG PO TABS: 10.0000 mg | ORAL_TABLET | Freq: Every day | ORAL | 2 refills | Status: AC

## 2024-04-02 MED ORDER — LOSARTAN POTASSIUM-HCTZ 100-12.5 MG PO TABS: 1.0000 | ORAL_TABLET | Freq: Every day | ORAL | 0 refills | Status: DC

## 2024-04-02 NOTE — Assessment & Plan Note (Signed)
 Uncontrolled.  Discussed to never adjust medications without my advisement.   Stop losartan  50 mg.  Start losartan -hydrochlorothiazide 100-12.5 mg daily. We will plan to see him in 2-3 weeks for BP check and BMP

## 2024-04-02 NOTE — Progress Notes (Signed)
 Subjective:    Patient ID: Joseph [name], male    DOB: 10/15/1971, 53 y.o.   MRN: [id number]  Hypertension Associated symptoms include headaches. Pertinent negatives include no chest pain or shortness of breath.    Joseph [name] is a very pleasant 53 y.o. male with a history of hypertension, OSA, COPD, type 2 diabetes, BPH, chronic pain syndrome, chronic knee pain, testosterone  deficiency, chronic fatigue who presents today for follow-up of hypertension.  He is needing refills of Prevacid  and Zyrtec .   He was last evaluated on 01/30/2024 for his annual physical.  He was noted to be hypertensive despite losartan  50 mg daily.  He was under increased amount of stress during this visit so he was advised to check his blood pressure and follow-up with us  in 2 to 3 weeks if readings remain elevated.  Today he discusses that his blood pressure has been elevated with home readings in the 140-170/90-100s.  He is compliant to losartan  50 mg daily. He remains under a lot of stress with his personal life. He's been taking 2 losartan  pills a few times, has taken 4 pills in one day once.   He is currently been staying in a motel as he has no home. He will soon have to leave the motel and stay in his truck. He's able to afford food sometimes. He cannot live with any family members.    BP Readings from Last 3 Encounters:  04/02/24 (!) 152/88  01/30/24 (!) 170/94  01/30/24 (!) 150/92      Review of Systems  Constitutional:  Positive for fatigue.  Respiratory:  Negative for shortness of breath.   Cardiovascular:  Negative for chest pain.  Neurological:  Positive for headaches.  Psychiatric/Behavioral:  The patient is nervous/anxious.          Past Medical History:  Diagnosis Date   Acute encephalopathy 03/24/2018   Anxiety    Arthritis    oa left knee and lower back   Asthma    Bucket handle tear of meniscus of knee (Right)    Cataract    hx of bilateral   CHF (congestive heart failure)  (HCC)    Chronic back pain    Chronic pain of left knee    COPD (chronic obstructive pulmonary disease) (HCC)    COVID-19 10/31/2021   Diabetes mellitus    type 2   DVT (deep venous thrombosis) (HCC) 01/09/2022   GERD (gastroesophageal reflux disease)    INGUINAL PAIN, LEFT 10/03/2009   Qualifier: Diagnosis of  By: Nolene Baumgarten MD, Sandi L    Medial meniscus, posterior horn derangement 04/22/2014   Oral thrush 11/10/2018   Osteomyelitis (HCC) 02/14/2022   Overdose    03-24-18 accidental   Rash and nonspecific skin eruption 03/12/2017   S/P knee surgery 04/26/2014   Surgery May 22 partial medial meniscectomy   Septic arthritis (HCC) 01/09/2022   Septic arthritis of knee (HCC) 01/25/2022   Septic arthritis of knee, right (HCC) 01/09/2022   Serratia marcescens infection 02/14/2022   Shortness of breath    with heavy exertion   Sleep apnea    Status post unicompartmental knee replacement, left 07/21/2018   Tinea corporis 01/07/2018   Tinea versicolor 01/07/2018    Social History   Socioeconomic History   Marital status: Divorced    Spouse name: Not on file   Number of children: Not on file   Years of education: Not on file   Highest education level: Not on file  Occupational History   Occupation: disability  Tobacco Use   Smoking status: Every Day    Current packs/day: 2.00    Average packs/day: 2.0 packs/day for 30.3 years (60.6 ttl pk-yrs)    Types: Cigarettes    Start date: 12/17/1993   Smokeless tobacco: Never   Tobacco comments:    States 2 ppd, wants to quit but has had 3 surgeries in 2 months  Vaping Use   Vaping status: Former  Substance and Sexual Activity   Alcohol use: Never    Alcohol/week: 0.0 standard drinks of alcohol   Drug use: Never   Sexual activity: Yes    Birth control/protection: Surgical  Other Topics Concern   Not on file  Social History Narrative   Recent visit to [facility] in [place] d/t increased pain where he was prescribed percocet 7.5-325  mg for pain.   Social Drivers of Corporate investment banker Strain: High Risk (01/30/2024)   Overall Financial Resource Strain (CARDIA)    Difficulty of Paying Living Expenses: Very hard  Food Insecurity: Low Risk  (02/02/2024)   Received from [facility]   Hunger Vital Sign    Worried About Running Out of Food in the Last Year: Never true    Ran Out of Food in the Last Year: Never true  Recent Concern: Food Insecurity - Food Insecurity Present (01/30/2024)   Hunger Vital Sign    Worried About Running Out of Food in the Last Year: Often true    Ran Out of Food in the Last Year: Often true  Transportation Needs: No Transportation Needs (02/02/2024)   Received from [facility]    In the past 12 months, has lack of reliable transportation kept you from medical appointments, meetings, work or from getting things needed for daily living? : No  Physical Activity: Inactive (01/30/2024)   Exercise Vital Sign    Days of Exercise per Week: 0 days    Minutes of Exercise per Session: 0 min  Stress: Stress Concern Present (01/30/2024)   [employer] of Occupational Health - Occupational Stress Questionnaire    Feeling of Stress : Rather much  Social Connections: Socially Isolated (01/30/2024)   Social Connection and Isolation Panel [NHANES]    Frequency of Communication with Friends and Family: Never    Frequency of Social Gatherings with Friends and Family: Never    Attends Religious Services: Never    Database administrator or Organizations: No    Attends Banker Meetings: Never    Marital Status: Divorced  Catering manager Violence: Not At Risk (01/30/2024)   Humiliation, Afraid, Rape, and Kick questionnaire    Fear of Current or Ex-Partner: No    Emotionally Abused: No    Physically Abused: No    Sexually Abused: No    Past Surgical History:  Procedure Laterality Date   CARPAL TUNNEL RELEASE Left    CATARACT EXTRACTION W/PHACO Right 03/11/2016    Procedure: CATARACT EXTRACTION PHACO AND INTRAOCULAR LENS PLACEMENT (IOC);  Surgeon: Anner Kill, MD;  Location: [facility];  Service: Ophthalmology;  Laterality: Right;  CDE 4.24   EYE SURGERY Right 2018   ioc with lens replacement   HERNIA REPAIR Bilateral 2010   umbilical   INCISION AND DRAINAGE ABSCESS Right 01/02/2022   Procedure: RIGHT KNEE ARTHROSCOPIC IRRIGATION AND DEBRIDEMENT;  Surgeon: Murleen Arms, MD;  Location: [facility];  Service: Orthopedics;  Laterality: Right;   INCISION AND DRAINAGE ABSCESS Right 01/26/2022  Procedure: INCISION AND DRAINAGE ABSCESS POST OP RIGHT KNEE ARTHROSCOPY;  Surgeon: Murleen Arms, MD;  Location: [facility];  Service: Orthopedics;  Laterality: Right;   KNEE ARTHROSCOPY WITH MEDIAL MENISECTOMY Left 04/22/2014   Procedure: LEFT KNEE ARTHROSCOPY WITH MEDIAL MENISECTOMY;  Surgeon: Darrin Emerald, MD;  Location: [facility];  Service: Orthopedics;  Laterality: Left;   KNEE ARTHROSCOPY WITH MEDIAL MENISECTOMY Right 12/06/2015   Procedure: RIGHT KNEE ARTHROSCOPY WITH MEDIAL MENISECTOMY;  Surgeon: Darrin Emerald, MD;  Location: [facility];  Service: Orthopedics;  Laterality: Right;   KNEE ARTHROSCOPY WITH MEDIAL MENISECTOMY Left 03/20/2017   Procedure: KNEE ARTHROSCOPY WITH MEDIAL MENISECTOMY;  Surgeon: Darrin Emerald, MD;  Location: [facility];  Service: Orthopedics;  Laterality: Left;   ORIF WRIST FRACTURE Left 12/19/2016   Procedure: OPEN REDUCTION INTERNAL FIXATION (ORIF) WRIST FRACTURE;  Surgeon: Brunilda Capra, MD;  Location: [facility];  Service: Orthopedics;  Laterality: Left;  accumed    PARTIAL KNEE ARTHROPLASTY Left 07/21/2018   Procedure: LEFT UNICOMPARTMENTAL KNEE;  Surgeon: Saundra Curl, MD;  Location: [facility];  Service: Orthopedics;  Laterality: Left;   SHOULDER ARTHROCENTESIS Right 2008 or 2010   VASECTOMY  2014    Family History  Problem Relation Age of Onset   Emphysema Maternal Grandfather    Emphysema  Maternal Grandmother    Clotting disorder Maternal Grandmother     Allergies  Allergen Reactions   Keflex  [Cephalexin ] Nausea Only   Metformin  Nausea And Vomiting   Symbicort  [Budesonide -Formoterol  Fumarate] Other (See Comments)    States that 3 doses were used and breathing became worse    Current Outpatient Medications on File Prior to Visit  Medication Sig Dispense Refill   albuterol  (PROVENTIL ) (2.5 MG/3ML) 0.083% nebulizer solution Take 3 mLs (2.5 mg total) by nebulization every 4 (four) hours as needed for wheezing or shortness of breath. 360 mL 2   atorvastatin  (LIPITOR) 40 MG tablet TAKE ONE (1) TABLET BY MOUTH EVERY DAY FOR CHOLESTEROL 90 tablet 2   blood glucose meter kit and supplies KIT Dispense based on patient and insurance preference. Use up to four times daily as directed. (Dx code E11.65) 1 each 0   buPROPion  (WELLBUTRIN  SR) 150 MG 12 hr tablet Take 1 tablet (150 mg total) by mouth 2 (two) times daily. for anxiety and depression. 180 tablet 0   citalopram  (CELEXA ) 40 MG tablet Take 1 tablet (40 mg total) by mouth daily. for anxiety and depression. 90 tablet 2   Continuous Blood Gluc Sensor (FREESTYLE LIBRE 2 SENSOR) MISC by Does not apply route. Apply sensor every 14 days to monitor sugar continuously. Via [facility]     empagliflozin  (JARDIANCE ) 10 MG TABS tablet Take 1 tablet (10 mg total) by mouth daily. for diabetes. 90 tablet 1   fluticasone  furoate-vilanterol (BREO ELLIPTA ) 200-25 MCG/ACT AEPB Inhale 1 puff into the lungs in the morning. 1 each 11   glipiZIDE  (GLUCOTROL ) 10 MG tablet Take 1 tablet (10 mg total) by mouth 2 (two) times daily before a meal. for diabetes. 180 tablet 0   glucose blood test strip Use as instructed to test blood sugar up to 4 times daily. (Dx code E11.65) 100 each 12   hydrOXYzine  (ATARAX ) 10 MG tablet Take 1-2 tablets (10-20 mg total) by mouth 2 (two) times daily as needed. For anxiety. 180 tablet 0   insulin  degludec (TRESIBA  FLEXTOUCH) 100  UNIT/ML FlexTouch Pen Inject 35 Units into the skin daily. for diabetes. 30 mL 0  Insulin  Pen Needle (PEN NEEDLES) 31G X 6 MM MISC Use once to twice daily with insulin . 100 each 1   Ipratropium-Albuterol  (COMBIVENT  RESPIMAT) 20-100 MCG/ACT AERS respimat One puff every 6 hours as needed 4 g 5   Lancets MISC Use as directed to check blood sugar up to 4 times daily. (Dx code E11.65) 100 each 3   metFORMIN  (GLUCOPHAGE -XR) 500 MG 24 hr tablet Take 2 tablets (1,000 mg total) by mouth 2 (two) times daily with a meal. for diabetes. 360 tablet 0   NUCYNTA  100 MG TABS Take 1 tablet by mouth 4 (four) times daily.     OVER THE COUNTER MEDICATION Take 2 tablets by mouth every morning. Osteo Biflex triple strength with turmeric     OVER THE COUNTER MEDICATION Take 1 tablet by mouth every morning. Neuriva     tadalafil (CIALIS) 20 MG tablet Take 20 mg by mouth daily as needed for erectile dysfunction.     tamsulosin  (FLOMAX ) 0.4 MG CAPS capsule TAKE ONE CAPSULE BY MOUTH DAILY 90 capsule 1   tiZANidine (ZANAFLEX) 2 MG tablet Take 2 mg by mouth 3 (three) times daily.     zolpidem  (AMBIEN ) 10 MG tablet Take 1 tablet (10 mg total) by mouth at bedtime as needed for sleep. 90 tablet 0   No current facility-administered medications on file prior to visit.    BP (!) 152/88   Pulse 95   Temp 98.3 F (36.8 C) (Temporal)   Ht 5\' 10"  (1.778 m)   Wt 246 lb (111.6 kg)   SpO2 97%   BMI 35.30 kg/m  Objective:   Physical Exam Cardiovascular:     Rate and Rhythm: Normal rate and regular rhythm.  Pulmonary:     Effort: Pulmonary effort is normal.     Breath sounds: Normal breath sounds.  Musculoskeletal:     Cervical back: Neck supple.  Skin:    General: Skin is warm and dry.  Neurological:     Mental Status: He is alert and oriented to person, place, and time.  Psychiatric:        Mood and Affect: Mood normal.           Assessment & Plan:  Essential hypertension Assessment &  Plan: Uncontrolled.  Discussed to never adjust medications without my advisement.   Stop losartan  50 mg.  Start losartan -hydrochlorothiazide 100-12.5 mg daily. We will plan to see him in 2-3 weeks for BP check and BMP  Orders: -     Losartan  Potassium-HCTZ; Take 1 tablet by mouth daily. for blood pressure.  Dispense: 90 tablet; Refill: 0  Food insecurity Assessment & Plan: Referral placed to the [facility] for further assistance.  Orders: -     AMB Referral [facility] Care Management  Housing instability Assessment & Plan: Referral placed to the [facility] for further assistance.  Orders: -     AMB Referral [facility] Care Management  Gastroesophageal reflux disease, unspecified whether esophagitis present -     Lansoprazole ; Take 1 capsule (30 mg total) by mouth 2 (two) times daily before a meal. for heartburn.  Dispense: 180 capsule; Refill: 2  Seasonal allergies -     Cetirizine  HCl; Take 1 tablet (10 mg total) by mouth daily. For allergies  Dispense: 90 tablet; Refill: 2        Gabriel John, NP

## 2024-04-02 NOTE — Assessment & Plan Note (Signed)
 Referral placed to the [facility] for further assistance.

## 2024-04-02 NOTE — Patient Instructions (Addendum)
 Stop taking losartan  50 mg for blood pressure.  Start losartan -hydrochlorothiazide 100-12.5 mg daily for blood pressure.  Please schedule a follow up visit to meet back with me in 2-3 weeks for blood pressure check.   It was a pleasure to see you today!

## 2024-04-04 DIAGNOSIS — Z794 Long term (current) use of insulin: Secondary | ICD-10-CM | POA: Diagnosis not present

## 2024-04-04 DIAGNOSIS — E1165 Type 2 diabetes mellitus with hyperglycemia: Secondary | ICD-10-CM | POA: Diagnosis not present

## 2024-04-06 ENCOUNTER — Telehealth: Payer: Self-pay

## 2024-04-06 NOTE — Progress Notes (Signed)
   Telephone encounter was:  Unsuccessful.  04/06/2024 Name: Joseph [name] MRN: [id number] DOB: 1971/02/20  Unsuccessful outbound call made today to assist with:  Housing and food  Outreach Attempt:  1st Attempt  A HIPAA compliant voice message was left requesting a return call.  Instructed patient to call back   [name] [facility] Guide, Phone: [phone] Fax: [phone] Website: [email or web address]

## 2024-04-08 ENCOUNTER — Telehealth: Payer: Self-pay

## 2024-04-08 ENCOUNTER — Other Ambulatory Visit: Payer: Self-pay | Admitting: Primary Care

## 2024-04-08 DIAGNOSIS — E1165 Type 2 diabetes mellitus with hyperglycemia: Secondary | ICD-10-CM

## 2024-04-08 NOTE — Progress Notes (Signed)
   Telephone encounter was:  Unsuccessful.  04/08/2024 Name: Joseph [name] MRN: [id number] DOB: [birth year]  Unsuccessful outbound call made today to assist with:  Food Insecurity and Housing  Outreach Attempt:  2nd Attempt    [name] [facility]  [facility] Guide, Phone: [phone] Fax: [phone] Website: [email or web address]

## 2024-04-09 ENCOUNTER — Telehealth: Payer: Self-pay

## 2024-04-09 NOTE — Progress Notes (Signed)
   Telephone encounter was:  Unsuccessful.  04/09/2024 Name: Joseph [name] MRN: [id number] DOB: [birth year]  Unsuccessful outbound call made today to assist with:  Transportation Needs  and Food Insecurity  Outreach Attempt:  3rd Attempt.  Referral closed unable to contact patient.    [name] [facility]  [facility] Guide, Phone: [phone] Fax: [phone] Website: [email or web address]

## 2024-04-12 MED ORDER — TRESIBA FLEXTOUCH 100 UNIT/ML ~~LOC~~ SOPN
35.0000 [IU] | PEN_INJECTOR | Freq: Every day | SUBCUTANEOUS | 1 refills | Status: AC
Start: 2024-04-12 — End: ?

## 2024-04-12 MED ORDER — BUPROPION HCL ER (SR) 150 MG PO TB12: 150.0000 mg | ORAL_TABLET | Freq: Two times a day (BID) | ORAL | 1 refills | Status: DC

## 2024-04-13 ENCOUNTER — Telehealth: Payer: Self-pay

## 2024-04-13 NOTE — Progress Notes (Signed)
   Telephone encounter was:  Unsuccessful.  04/13/2024 Name: SHEENA WURM MRN: [id number] DOB: 1971/02/17  Unsuccessful outbound call made today to assist with:  Food Insecurity  Outreach Attempt:  3rd Attempt.  Referral closed unable to contact patient.    [name] [facility]  [facility] Guide, Phone: [phone] Fax: [phone] Website: [email or web address]

## 2024-04-14 ENCOUNTER — Other Ambulatory Visit: Payer: Self-pay

## 2024-04-14 DIAGNOSIS — E119 Type 2 diabetes mellitus without complications: Secondary | ICD-10-CM

## 2024-04-14 DIAGNOSIS — Z794 Long term (current) use of insulin: Secondary | ICD-10-CM

## 2024-04-15 MED ORDER — METFORMIN HCL ER 500 MG PO TB24: 1000.0000 mg | ORAL_TABLET | Freq: Two times a day (BID) | ORAL | 0 refills | Status: DC

## 2024-04-15 MED ORDER — GLIPIZIDE 10 MG PO TABS: 10.0000 mg | ORAL_TABLET | Freq: Two times a day (BID) | ORAL | 0 refills | Status: DC

## 2024-04-18 DIAGNOSIS — J449 Chronic obstructive pulmonary disease, unspecified: Secondary | ICD-10-CM | POA: Diagnosis not present

## 2024-04-18 DIAGNOSIS — G4733 Obstructive sleep apnea (adult) (pediatric): Secondary | ICD-10-CM | POA: Diagnosis not present

## 2024-04-22 ENCOUNTER — Ambulatory Visit (INDEPENDENT_AMBULATORY_CARE_PROVIDER_SITE_OTHER): Admitting: Primary Care

## 2024-04-22 ENCOUNTER — Encounter: Payer: Self-pay | Admitting: Primary Care

## 2024-04-22 VITALS — BP 132/84 | HR 98 | Temp 97.5°F | Ht 70.0 in | Wt 242.0 lb

## 2024-04-22 DIAGNOSIS — Z59819 Housing instability, housed unspecified: Secondary | ICD-10-CM

## 2024-04-22 DIAGNOSIS — I10 Essential (primary) hypertension: Secondary | ICD-10-CM

## 2024-04-22 NOTE — Progress Notes (Signed)
 Subjective:    Patient ID: Joseph [name], male    DOB: 12-05-1970, 53 y.o.   MRN: [id number]  HPI  Joseph [name] is a very pleasant 53 y.o. male with a history of hypertension, OSA, COPD, type 2 diabetes, chronic pain syndrome, tobacco use, chronic fatigue who presents today for follow-up of hypertension.  Joseph [name] was last evaluated on 04/02/2024 for follow-up of hypertension.  Blood pressure readings were above goal despite taking losartan  50 mg daily and occasionally increasing his dose to 100 mg and 200 mg.  During this visit Joseph [name] was instructed not to adjust his medication doses without my instruction.  His losartan  50 mg was discontinued.  Joseph [name] was initiated on losartan -hydrochlorothiazide 100-12.5 mg tablets daily.  Joseph [name] also mentioned housing insecurity so a referral to a [facility] was placed.  Since his last visit Joseph [name] is compliant to losartan -hydrochlorothiazide 100-12.5 mg daily.  Our [facility] team has attempted 4 times to reach him via phone and were unsuccessful. Joseph [name] is checking his BP at home and is getting readings of 130s/80s. Joseph [name]'s had a few readings in the low 100s systolic. Headaches have improved. Joseph [name] denies dizziness.   Joseph [name] has tried to contact [facility] a few times and has not heard back. Joseph [name] will be losing his housing in a few days. Has been staying in a motel.   BP Readings from Last 3 Encounters:  04/22/24 132/84  04/02/24 (!) 152/88  01/30/24 (!) 170/94     Review of Systems  Respiratory:  Negative for shortness of breath.   Cardiovascular:  Negative for chest pain.  Neurological:  Positive for headaches. Negative for dizziness.         Past Medical History:  Diagnosis Date   Acute encephalopathy 03/24/2018   Anxiety    Arthritis    oa left knee and lower back   Asthma    Bucket handle tear of meniscus of knee (Right)    Cataract    hx of bilateral   CHF (congestive heart failure) (HCC)    Chronic back pain    Chronic pain of left knee    COPD (chronic obstructive pulmonary  disease) (HCC)    COVID-19 10/31/2021   Diabetes mellitus    type 2   DVT (deep venous thrombosis) (HCC) 01/09/2022   GERD (gastroesophageal reflux disease)    INGUINAL PAIN, LEFT 10/03/2009   Qualifier: Diagnosis of  By: Nolene Baumgarten MD, Sandi L    Medial meniscus, posterior horn derangement 04/22/2014   Oral thrush 11/10/2018   Osteomyelitis (HCC) 02/14/2022   Overdose    03-24-18 accidental   Rash and nonspecific skin eruption 03/12/2017   S/P knee surgery 04/26/2014   Surgery May 22 partial medial meniscectomy   Septic arthritis (HCC) 01/09/2022   Septic arthritis of knee (HCC) 01/25/2022   Septic arthritis of knee, right (HCC) 01/09/2022   Serratia marcescens infection 02/14/2022   Shortness of breath    with heavy exertion   Sleep apnea    Status post unicompartmental knee replacement, left 07/21/2018   Tinea corporis 01/07/2018   Tinea versicolor 01/07/2018    Social History   Socioeconomic History   Marital status: Divorced    Spouse name: Not on file   Number of children: Not on file   Years of education: Not on file   Highest education level: Not on file  Occupational History   Occupation: disability  Tobacco Use   Smoking status: Every Day    Current packs/day: 2.00  Average packs/day: 2.0 packs/day for 30.3 years (60.7 ttl pk-yrs)    Types: Cigarettes    Start date: 12/17/1993   Smokeless tobacco: Never   Tobacco comments:    States 2 ppd, wants to quit but has had 3 surgeries in 2 months  Vaping Use   Vaping status: Former  Substance and Sexual Activity   Alcohol use: Never    Alcohol/week: 0.0 standard drinks of alcohol   Drug use: Never   Sexual activity: Yes    Birth control/protection: Surgical  Other Topics Concern   Not on file  Social History Narrative   Recent visit to [facility] in [place] d/t increased pain where Joseph [name] was prescribed percocet 7.5-325 mg for pain.   Social Drivers of Corporate investment banker Strain: High Risk (01/30/2024)    Overall Financial Resource Strain (CARDIA)    Difficulty of Paying Living Expenses: Very hard  Food Insecurity: Low Risk  (02/02/2024)   Received from [facility]   Hunger Vital Sign    Worried About Running Out of Food in the Last Year: Never true    Ran Out of Food in the Last Year: Never true  Recent Concern: Food Insecurity - Food Insecurity Present (01/30/2024)   Hunger Vital Sign    Worried About Running Out of Food in the Last Year: Often true    Ran Out of Food in the Last Year: Often true  Transportation Needs: No Transportation Needs (02/02/2024)   Received from [facility]    In the past 12 months, has lack of reliable transportation kept you from medical appointments, meetings, work or from getting things needed for daily living? : No  Physical Activity: Inactive (01/30/2024)   Exercise Vital Sign    Days of Exercise per Week: 0 days    Minutes of Exercise per Session: 0 min  Stress: Stress Concern Present (01/30/2024)   [employer] of Occupational Health - Occupational Stress Questionnaire    Feeling of Stress : Rather much  Social Connections: Socially Isolated (01/30/2024)   Social Connection and Isolation Panel [NHANES]    Frequency of Communication with Friends and Family: Never    Frequency of Social Gatherings with Friends and Family: Never    Attends Religious Services: Never    Database administrator or Organizations: No    Attends Banker Meetings: Never    Marital Status: Divorced  Catering manager Violence: Not At Risk (01/30/2024)   Humiliation, Afraid, Rape, and Kick questionnaire    Fear of Current or Ex-Partner: No    Emotionally Abused: No    Physically Abused: No    Sexually Abused: No    Past Surgical History:  Procedure Laterality Date   CARPAL TUNNEL RELEASE Left    CATARACT EXTRACTION W/PHACO Right 03/11/2016   Procedure: CATARACT EXTRACTION PHACO AND INTRAOCULAR LENS PLACEMENT (IOC);  Surgeon: Anner Kill, MD;   Location: [facility];  Service: Ophthalmology;  Laterality: Right;  CDE 4.24   EYE SURGERY Right 2018   ioc with lens replacement   HERNIA REPAIR Bilateral 2010   umbilical   INCISION AND DRAINAGE ABSCESS Right 01/02/2022   Procedure: RIGHT KNEE ARTHROSCOPIC IRRIGATION AND DEBRIDEMENT;  Surgeon: Murleen Arms, MD;  Location: [facility];  Service: Orthopedics;  Laterality: Right;   INCISION AND DRAINAGE ABSCESS Right 01/26/2022   Procedure: INCISION AND DRAINAGE ABSCESS POST OP RIGHT KNEE ARTHROSCOPY;  Surgeon: Murleen Arms, MD;  Location: [facility];  Service:  Orthopedics;  Laterality: Right;   KNEE ARTHROSCOPY WITH MEDIAL MENISECTOMY Left 04/22/2014   Procedure: LEFT KNEE ARTHROSCOPY WITH MEDIAL MENISECTOMY;  Surgeon: Darrin Emerald, MD;  Location: [facility];  Service: Orthopedics;  Laterality: Left;   KNEE ARTHROSCOPY WITH MEDIAL MENISECTOMY Right 12/06/2015   Procedure: RIGHT KNEE ARTHROSCOPY WITH MEDIAL MENISECTOMY;  Surgeon: Darrin Emerald, MD;  Location: [facility];  Service: Orthopedics;  Laterality: Right;   KNEE ARTHROSCOPY WITH MEDIAL MENISECTOMY Left 03/20/2017   Procedure: KNEE ARTHROSCOPY WITH MEDIAL MENISECTOMY;  Surgeon: Darrin Emerald, MD;  Location: [facility];  Service: Orthopedics;  Laterality: Left;   ORIF WRIST FRACTURE Left 12/19/2016   Procedure: OPEN REDUCTION INTERNAL FIXATION (ORIF) WRIST FRACTURE;  Surgeon: Brunilda Capra, MD;  Location: [facility];  Service: Orthopedics;  Laterality: Left;  accumed    PARTIAL KNEE ARTHROPLASTY Left 07/21/2018   Procedure: LEFT UNICOMPARTMENTAL KNEE;  Surgeon: Saundra Curl, MD;  Location: [facility];  Service: Orthopedics;  Laterality: Left;   SHOULDER ARTHROCENTESIS Right 2008 or 2010   VASECTOMY  2014    Family History  Problem Relation Age of Onset   Emphysema Maternal Grandfather    Emphysema Maternal Grandmother    Clotting disorder Maternal Grandmother     Allergies  Allergen Reactions    Keflex  [Cephalexin ] Nausea Only   Metformin  Nausea And Vomiting   Symbicort  [Budesonide -Formoterol  Fumarate] Other (See Comments)    States that 3 doses were used and breathing became worse    Current Outpatient Medications on File Prior to Visit  Medication Sig Dispense Refill   albuterol  (PROVENTIL ) (2.5 MG/3ML) 0.083% nebulizer solution Take 3 mLs (2.5 mg total) by nebulization every 4 (four) hours as needed for wheezing or shortness of breath. 360 mL 2   atorvastatin  (LIPITOR) 40 MG tablet TAKE ONE (1) TABLET BY MOUTH EVERY DAY FOR CHOLESTEROL 90 tablet 2   blood glucose meter kit and supplies KIT Dispense based on patient and insurance preference. Use up to four times daily as directed. (Dx code E11.65) 1 each 0   buPROPion  (WELLBUTRIN  SR) 150 MG 12 hr tablet Take 1 tablet (150 mg total) by mouth 2 (two) times daily. for anxiety and depression. 180 tablet 1   cetirizine  (ZYRTEC ) 10 MG tablet Take 1 tablet (10 mg total) by mouth daily. For allergies 90 tablet 2   citalopram  (CELEXA ) 40 MG tablet Take 1 tablet (40 mg total) by mouth daily. for anxiety and depression. 90 tablet 2   Continuous Blood Gluc Sensor (FREESTYLE LIBRE 2 SENSOR) MISC by Does not apply route. Apply sensor every 14 days to monitor sugar continuously. Via [facility]     empagliflozin  (JARDIANCE ) 10 MG TABS tablet Take 1 tablet (10 mg total) by mouth daily. for diabetes. 90 tablet 1   fluticasone  furoate-vilanterol (BREO ELLIPTA ) 200-25 MCG/ACT AEPB Inhale 1 puff into the lungs in the morning. 1 each 11   glipiZIDE  (GLUCOTROL ) 10 MG tablet Take 1 tablet (10 mg total) by mouth 2 (two) times daily before a meal. for diabetes. 180 tablet 0   glucose blood test strip Use as instructed to test blood sugar up to 4 times daily. (Dx code E11.65) 100 each 12   hydrOXYzine  (ATARAX ) 10 MG tablet Take 1-2 tablets (10-20 mg total) by mouth 2 (two) times daily as needed. For anxiety. 180 tablet 0   insulin  degludec (TRESIBA  FLEXTOUCH)  100 UNIT/ML FlexTouch Pen Inject 35 Units into the skin daily. for diabetes. 30 mL 1  Insulin  Pen Needle (PEN NEEDLES) 31G X 6 MM MISC Use once to twice daily with insulin . 100 each 1   Ipratropium-Albuterol  (COMBIVENT  RESPIMAT) 20-100 MCG/ACT AERS respimat One puff every 6 hours as needed 4 g 5   Lancets MISC Use as directed to check blood sugar up to 4 times daily. (Dx code E11.65) 100 each 3   lansoprazole  (PREVACID ) 30 MG capsule Take 1 capsule (30 mg total) by mouth 2 (two) times daily before a meal. for heartburn. 180 capsule 2   losartan -hydrochlorothiazide (HYZAAR) 100-12.5 MG tablet Take 1 tablet by mouth daily. for blood pressure. 90 tablet 0   metFORMIN  (GLUCOPHAGE -XR) 500 MG 24 hr tablet Take 2 tablets (1,000 mg total) by mouth 2 (two) times daily with a meal. for diabetes. 360 tablet 0   NUCYNTA  100 MG TABS Take 1 tablet by mouth 4 (four) times daily.     OVER THE COUNTER MEDICATION Take 2 tablets by mouth every morning. Osteo Biflex triple strength with turmeric     OVER THE COUNTER MEDICATION Take 1 tablet by mouth every morning. Neuriva     tadalafil (CIALIS) 20 MG tablet Take 20 mg by mouth daily as needed for erectile dysfunction.     tamsulosin  (FLOMAX ) 0.4 MG CAPS capsule TAKE ONE CAPSULE BY MOUTH DAILY 90 capsule 1   tiZANidine (ZANAFLEX) 2 MG tablet Take 2 mg by mouth 3 (three) times daily.     zolpidem  (AMBIEN ) 10 MG tablet Take 1 tablet (10 mg total) by mouth at bedtime as needed for sleep. 90 tablet 0   No current facility-administered medications on file prior to visit.    BP 132/84   Pulse 98   Temp (!) 97.5 F (36.4 C) (Temporal)   Ht 5\' 10"  (1.778 m)   Wt 242 lb (109.8 kg)   SpO2 99%   BMI 34.72 kg/m  Objective:   Physical Exam Cardiovascular:     Rate and Rhythm: Normal rate and regular rhythm.  Pulmonary:     Effort: Pulmonary effort is normal.     Breath sounds: Normal breath sounds.  Musculoskeletal:     Cervical back: Neck supple.  Skin:     General: Skin is warm and dry.  Neurological:     Mental Status: Joseph [name] is alert and oriented to person, place, and time.  Psychiatric:        Mood and Affect: Mood normal.           Assessment & Plan:  Essential hypertension Assessment & Plan: Improved!  Continue losartan -hydrochlorothiazide 100-12.5 mg daily. BMP pending today.  Orders: -     Basic metabolic panel with GFR  Housing instability Assessment & Plan: Joseph [name] will reach back out to [facility] team for housing options.          Esbeydi Manago K Kaneesha Constantino, NP

## 2024-04-22 NOTE — Patient Instructions (Signed)
 Continue taking losartan -hydrochlorothiazide 100-12.5 mg once daily for blood pressure.  Stop by the lab prior to leaving today. I will notify you of your results once received.   Please call the VBCI team for food and housing as discussed.  It was a pleasure to see you today!

## 2024-04-22 NOTE — Assessment & Plan Note (Signed)
 He will reach back out to VBCI team for housing options.

## 2024-04-22 NOTE — Assessment & Plan Note (Signed)
 Improved!  Continue losartan -hydrochlorothiazide 100-12.5 mg daily. BMP pending today.

## 2024-04-23 ENCOUNTER — Other Ambulatory Visit: Payer: Self-pay | Admitting: Primary Care

## 2024-04-23 ENCOUNTER — Ambulatory Visit: Payer: Self-pay | Admitting: Primary Care

## 2024-04-23 DIAGNOSIS — I10 Essential (primary) hypertension: Secondary | ICD-10-CM

## 2024-04-23 LAB — BASIC METABOLIC PANEL WITH GFR
BUN: 12 mg/dL (ref 6–23)
CO2: 24 meq/L (ref 19–32)
Calcium: 9.2 mg/dL (ref 8.4–10.5)
Chloride: 95 meq/L — ABNORMAL LOW (ref 96–112)
Creatinine, Ser: 1.35 mg/dL (ref 0.40–1.50)
GFR: 59.74 mL/min — ABNORMAL LOW (ref >60.00)
Glucose, Bld: 329 mg/dL — ABNORMAL HIGH (ref 70–99)
Potassium: 5.4 meq/L — ABNORMAL HIGH (ref 3.5–5.1)
Sodium: 130 meq/L — ABNORMAL LOW (ref 135–145)

## 2024-04-23 MED ORDER — AMLODIPINE BESYLATE 5 MG PO TABS: ORAL_TABLET | ORAL | 0 refills | Status: DC

## 2024-04-28 DIAGNOSIS — M25561 Pain in right knee: Secondary | ICD-10-CM | POA: Diagnosis not present

## 2024-04-28 DIAGNOSIS — M1990 Unspecified osteoarthritis, unspecified site: Secondary | ICD-10-CM | POA: Diagnosis not present

## 2024-04-28 DIAGNOSIS — E119 Type 2 diabetes mellitus without complications: Secondary | ICD-10-CM | POA: Diagnosis not present

## 2024-04-28 DIAGNOSIS — E6609 Other obesity due to excess calories: Secondary | ICD-10-CM | POA: Diagnosis not present

## 2024-04-28 DIAGNOSIS — Z79899 Other long term (current) drug therapy: Secondary | ICD-10-CM | POA: Diagnosis not present

## 2024-04-28 DIAGNOSIS — T402X5A Adverse effect of other opioids, initial encounter: Secondary | ICD-10-CM | POA: Diagnosis not present

## 2024-04-28 DIAGNOSIS — G894 Chronic pain syndrome: Secondary | ICD-10-CM | POA: Diagnosis not present

## 2024-04-28 DIAGNOSIS — R030 Elevated blood-pressure reading, without diagnosis of hypertension: Secondary | ICD-10-CM | POA: Diagnosis not present

## 2024-04-28 DIAGNOSIS — E559 Vitamin D deficiency, unspecified: Secondary | ICD-10-CM | POA: Diagnosis not present

## 2024-04-28 DIAGNOSIS — G8929 Other chronic pain: Secondary | ICD-10-CM | POA: Diagnosis not present

## 2024-04-28 DIAGNOSIS — E7800 Pure hypercholesterolemia, unspecified: Secondary | ICD-10-CM | POA: Diagnosis not present

## 2024-04-30 DIAGNOSIS — Z79899 Other long term (current) drug therapy: Secondary | ICD-10-CM | POA: Diagnosis not present

## 2024-05-20 DIAGNOSIS — G894 Chronic pain syndrome: Secondary | ICD-10-CM

## 2024-05-20 DIAGNOSIS — G8929 Other chronic pain: Secondary | ICD-10-CM

## 2024-05-20 DIAGNOSIS — G4700 Insomnia, unspecified: Secondary | ICD-10-CM

## 2024-05-21 MED ORDER — ZOLPIDEM TARTRATE 10 MG PO TABS: 10.0000 mg | ORAL_TABLET | Freq: Every evening | ORAL | 0 refills | Status: DC | PRN

## 2024-05-21 MED ORDER — GABAPENTIN 600 MG PO TABS: 600.0000 mg | ORAL_TABLET | Freq: Two times a day (BID) | ORAL | 1 refills | Status: AC

## 2024-05-27 DIAGNOSIS — Z79899 Other long term (current) drug therapy: Secondary | ICD-10-CM | POA: Diagnosis not present

## 2024-05-27 DIAGNOSIS — E119 Type 2 diabetes mellitus without complications: Secondary | ICD-10-CM | POA: Diagnosis not present

## 2024-05-27 DIAGNOSIS — M25561 Pain in right knee: Secondary | ICD-10-CM | POA: Diagnosis not present

## 2024-05-27 DIAGNOSIS — G8929 Other chronic pain: Secondary | ICD-10-CM | POA: Diagnosis not present

## 2024-05-27 DIAGNOSIS — I10 Essential (primary) hypertension: Secondary | ICD-10-CM | POA: Diagnosis not present

## 2024-05-28 ENCOUNTER — Other Ambulatory Visit: Payer: Self-pay

## 2024-05-28 DIAGNOSIS — E1165 Type 2 diabetes mellitus with hyperglycemia: Secondary | ICD-10-CM

## 2024-05-28 MED ORDER — PEN NEEDLES 31G X 6 MM MISC: 3 refills | Status: DC

## 2024-06-21 ENCOUNTER — Encounter: Payer: Self-pay | Admitting: Internal Medicine

## 2024-06-21 DIAGNOSIS — E291 Testicular hypofunction: Secondary | ICD-10-CM | POA: Diagnosis not present

## 2024-06-25 DIAGNOSIS — M25561 Pain in right knee: Secondary | ICD-10-CM | POA: Diagnosis not present

## 2024-06-25 DIAGNOSIS — R29818 Other symptoms and signs involving the nervous system: Secondary | ICD-10-CM | POA: Diagnosis not present

## 2024-06-25 DIAGNOSIS — T2000XA Burn of unspecified degree of head, face, and neck, unspecified site, initial encounter: Secondary | ICD-10-CM | POA: Diagnosis not present

## 2024-06-25 DIAGNOSIS — E119 Type 2 diabetes mellitus without complications: Secondary | ICD-10-CM | POA: Diagnosis not present

## 2024-06-25 DIAGNOSIS — Z6834 Body mass index (BMI) 34.0-34.9, adult: Secondary | ICD-10-CM | POA: Diagnosis not present

## 2024-06-25 DIAGNOSIS — I10 Essential (primary) hypertension: Secondary | ICD-10-CM | POA: Diagnosis not present

## 2024-06-25 DIAGNOSIS — G8929 Other chronic pain: Secondary | ICD-10-CM | POA: Diagnosis not present

## 2024-06-25 DIAGNOSIS — Z79899 Other long term (current) drug therapy: Secondary | ICD-10-CM | POA: Diagnosis not present

## 2024-06-30 DIAGNOSIS — Z79899 Other long term (current) drug therapy: Secondary | ICD-10-CM | POA: Diagnosis not present

## 2024-07-22 DIAGNOSIS — E119 Type 2 diabetes mellitus without complications: Secondary | ICD-10-CM

## 2024-07-23 ENCOUNTER — Other Ambulatory Visit: Payer: Self-pay | Admitting: Internal Medicine

## 2024-07-23 DIAGNOSIS — J45909 Unspecified asthma, uncomplicated: Secondary | ICD-10-CM

## 2024-07-23 MED ORDER — METFORMIN HCL ER 500 MG PO TB24: 1000.0000 mg | ORAL_TABLET | Freq: Two times a day (BID) | ORAL | 0 refills | Status: DC

## 2024-07-24 ENCOUNTER — Other Ambulatory Visit: Payer: Self-pay | Admitting: Primary Care

## 2024-07-24 DIAGNOSIS — I10 Essential (primary) hypertension: Secondary | ICD-10-CM

## 2024-07-24 DIAGNOSIS — E1165 Type 2 diabetes mellitus with hyperglycemia: Secondary | ICD-10-CM

## 2024-07-25 NOTE — Telephone Encounter (Signed)
Patient is due for diabetes follow up, this will be required prior to any further refills.  Please schedule, thank you!   

## 2024-07-26 DIAGNOSIS — I10 Essential (primary) hypertension: Secondary | ICD-10-CM | POA: Diagnosis not present

## 2024-07-26 DIAGNOSIS — G8929 Other chronic pain: Secondary | ICD-10-CM | POA: Diagnosis not present

## 2024-07-26 DIAGNOSIS — M25561 Pain in right knee: Secondary | ICD-10-CM | POA: Diagnosis not present

## 2024-07-26 DIAGNOSIS — K5909 Other constipation: Secondary | ICD-10-CM | POA: Diagnosis not present

## 2024-07-26 DIAGNOSIS — E7800 Pure hypercholesterolemia, unspecified: Secondary | ICD-10-CM | POA: Diagnosis not present

## 2024-07-26 NOTE — Telephone Encounter (Signed)
 Called unable to lvm, sent mychart to call and schedule.

## 2024-07-27 ENCOUNTER — Other Ambulatory Visit: Payer: Self-pay

## 2024-07-27 DIAGNOSIS — E1165 Type 2 diabetes mellitus with hyperglycemia: Secondary | ICD-10-CM

## 2024-07-28 ENCOUNTER — Encounter: Payer: Self-pay | Admitting: Primary Care

## 2024-07-28 NOTE — Telephone Encounter (Signed)
 Unable to leave message, sending letter.

## 2024-07-29 ENCOUNTER — Other Ambulatory Visit: Payer: Self-pay | Admitting: Primary Care

## 2024-07-29 DIAGNOSIS — I10 Essential (primary) hypertension: Secondary | ICD-10-CM

## 2024-07-30 NOTE — Telephone Encounter (Signed)
 Please call patient:  Received refill request for amlodipine  BP pill. Is he taking 1 pill or 2 pills daily? If 2 pills, then in can change it to the 10 mg dose so he only has to take 1 pill daily.

## 2024-07-30 NOTE — Telephone Encounter (Signed)
Unable to reach patient. Unable to leave voicemail.  

## 2024-08-03 NOTE — Telephone Encounter (Signed)
Unable to reach patient. Unable to leave voicemail.  

## 2024-08-05 NOTE — Telephone Encounter (Signed)
 Unable to reach patient. Unable to leave voicemail.  3rd attempt to reach patient, mailing letter.

## 2024-08-06 ENCOUNTER — Other Ambulatory Visit: Payer: Self-pay | Admitting: Primary Care

## 2024-08-06 DIAGNOSIS — I10 Essential (primary) hypertension: Secondary | ICD-10-CM

## 2024-08-10 MED ORDER — AMLODIPINE BESYLATE 10 MG PO TABS: 10.0000 mg | ORAL_TABLET | Freq: Every day | ORAL | 0 refills | Status: DC

## 2024-08-13 ENCOUNTER — Ambulatory Visit: Payer: Self-pay | Admitting: Primary Care

## 2024-08-13 ENCOUNTER — Ambulatory Visit: Admitting: Primary Care

## 2024-08-13 ENCOUNTER — Encounter: Payer: Self-pay | Admitting: Primary Care

## 2024-08-13 VITALS — BP 136/82 | HR 94 | Temp 98.0°F | Ht 70.0 in | Wt 239.0 lb

## 2024-08-13 DIAGNOSIS — E1165 Type 2 diabetes mellitus with hyperglycemia: Secondary | ICD-10-CM

## 2024-08-13 DIAGNOSIS — I10 Essential (primary) hypertension: Secondary | ICD-10-CM | POA: Diagnosis not present

## 2024-08-13 DIAGNOSIS — Z794 Long term (current) use of insulin: Secondary | ICD-10-CM | POA: Diagnosis not present

## 2024-08-13 DIAGNOSIS — Z7984 Long term (current) use of oral hypoglycemic drugs: Secondary | ICD-10-CM

## 2024-08-13 LAB — BASIC METABOLIC PANEL WITH GFR
BUN: 9 mg/dL (ref 6–23)
CO2: 25 meq/L (ref 19–32)
Calcium: 9.4 mg/dL (ref 8.4–10.5)
Chloride: 100 meq/L (ref 96–112)
Creatinine, Ser: 1.07 mg/dL (ref 0.40–1.50)
GFR: 78.8 mL/min (ref >60.00)
Glucose, Bld: 158 mg/dL — ABNORMAL HIGH (ref 70–99)
Potassium: 3.9 meq/L (ref 3.5–5.1)
Sodium: 135 meq/L (ref 135–145)

## 2024-08-13 LAB — POCT GLYCOSYLATED HEMOGLOBIN (HGB A1C): Hemoglobin A1C: 6.8 % — AB (ref 4.0–5.6)

## 2024-08-13 MED ORDER — FREESTYLE LIBRE 3 PLUS SENSOR MISC: 1 refills | Status: AC

## 2024-08-13 NOTE — Progress Notes (Signed)
 Subjective:    Patient ID: Joseph [name], male    DOB: 1971-07-17, 53 y.o.   MRN: [id number]  Joseph [name] is a very pleasant 53 y.o. male with a history of hypertension, type 2 diabetes, GERD, OSA, COPD, chronic fatigue, chronic pain who presents today for follow-up of hypertension and diabetes.  He would also like to discuss paresthesias.   1) Hypertension: Currently managed on amlodipine  10 mg daily. He denies chest pain, dizziness, headaches.   BP Readings from Last 3 Encounters:  08/13/24 136/82  04/22/24 132/84  04/02/24 (!) 152/88   2) Type 2 Diabetes:   Current medications include: Jardiance  10 mg daily, glipizide  10 mg twice daily, Tresiba  35 units daily, metformin  ER 1000 mg twice daily.  He is checking his blood glucose continuously but is out of sensors. He is not checking glucose levels.   Last A1C: 6.9 in February 2025, 6.8 today Last Eye Exam: UTD Last Foot Exam: Due Pneumonia Vaccination: 2025 Urine Microalbumin: UTD Statin:atorvastatin    Dietary changes since last visit: He is not focused on a healthy diet.    Exercise: None    Review of Systems  Eyes:  Negative for visual disturbance.  Respiratory:  Negative for shortness of breath.   Cardiovascular:  Negative for chest pain.  Neurological:  Positive for numbness. Negative for headaches.         Past Medical History:  Diagnosis Date   Acute encephalopathy 03/24/2018   Anxiety    Arthritis    oa left knee and lower back   Asthma    Bucket handle tear of meniscus of knee (Right)    Cataract    hx of bilateral   CHF (congestive heart failure) (HCC)    Chronic back pain    Chronic pain of left knee    COPD (chronic obstructive pulmonary disease) (HCC)    COVID-19 10/31/2021   Diabetes mellitus    type 2   DVT (deep venous thrombosis) (HCC) 01/09/2022   GERD (gastroesophageal reflux disease)    INGUINAL PAIN, LEFT 10/03/2009   Qualifier: Diagnosis of  By: Harvey MD, Sandi L    Medial  meniscus, posterior horn derangement 04/22/2014   Oral thrush 11/10/2018   Osteomyelitis (HCC) 02/14/2022   Overdose    03-24-18 accidental   Rash and nonspecific skin eruption 03/12/2017   S/P knee surgery 04/26/2014   Surgery May 22 partial medial meniscectomy   Septic arthritis (HCC) 01/09/2022   Septic arthritis of knee (HCC) 01/25/2022   Septic arthritis of knee, right (HCC) 01/09/2022   Serratia marcescens infection 02/14/2022   Shortness of breath    with heavy exertion   Sleep apnea    Status post unicompartmental knee replacement, left 07/21/2018   Tinea corporis 01/07/2018   Tinea versicolor 01/07/2018    Social History   Socioeconomic History   Marital status: Divorced    Spouse name: Not on file   Number of children: Not on file   Years of education: Not on file   Highest education level: Not on file  Occupational History   Occupation: disability  Tobacco Use   Smoking status: Every Day    Current packs/day: 2.00    Average packs/day: 2.0 packs/day for 30.7 years (61.3 ttl pk-yrs)    Types: Cigarettes    Start date: 12/17/1993   Smokeless tobacco: Never   Tobacco comments:    States 2 ppd, wants to quit but has had 3 surgeries in 2 months  Vaping Use   Vaping status: Former  Substance and Sexual Activity   Alcohol use: Never    Alcohol/week: 0.0 standard drinks of alcohol   Drug use: Never   Sexual activity: Yes    Birth control/protection: Surgical  Other Topics Concern   Not on file  Social History Narrative   Recent visit to [facility] in [place] d/t increased pain where he was prescribed percocet 7.5-325 mg for pain.   Social Drivers of Health   Financial Resource Strain: High Risk (01/30/2024)   Overall Financial Resource Strain (CARDIA)    Difficulty of Paying Living Expenses: Very hard  Food Insecurity: Low Risk  (02/02/2024)   Received from [facility]   Hunger Vital Sign    Within the past 12 months, you worried that your food would run out  before you got money to buy more: Never true    Within the past 12 months, the food you bought just didn't last and you didn't have money to get more. : Never true  Recent Concern: Food Insecurity - Food Insecurity Present (01/30/2024)   Hunger Vital Sign    Worried About Running Out of Food in the Last Year: Often true    Ran Out of Food in the Last Year: Often true  Transportation Needs: No Transportation Needs (02/02/2024)   Received from [facility]    In the past 12 months, has lack of reliable transportation kept you from medical appointments, meetings, work or from getting things needed for daily living? : No  Physical Activity: Inactive (01/30/2024)   Exercise Vital Sign    Days of Exercise per Week: 0 days    Minutes of Exercise per Session: 0 min  Stress: Stress Concern Present (01/30/2024)   [employer] of Occupational Health - Occupational Stress Questionnaire    Feeling of Stress : Rather much  Social Connections: Socially Isolated (01/30/2024)   Social Connection and Isolation Panel    Frequency of Communication with Friends and Family: Never    Frequency of Social Gatherings with Friends and Family: Never    Attends Religious Services: Never    Database administrator or Organizations: No    Attends Banker Meetings: Never    Marital Status: Divorced  Catering manager Violence: Not At Risk (01/30/2024)   Humiliation, Afraid, Rape, and Kick questionnaire    Fear of Current or Ex-Partner: No    Emotionally Abused: No    Physically Abused: No    Sexually Abused: No    Past Surgical History:  Procedure Laterality Date   CARPAL TUNNEL RELEASE Left    CATARACT EXTRACTION W/PHACO Right 03/11/2016   Procedure: CATARACT EXTRACTION PHACO AND INTRAOCULAR LENS PLACEMENT (IOC);  Surgeon: Cherene Mania, MD;  Location: [facility];  Service: Ophthalmology;  Laterality: Right;  CDE 4.24   EYE SURGERY Right 2018   ioc with lens replacement   HERNIA REPAIR  Bilateral 2010   umbilical   INCISION AND DRAINAGE ABSCESS Right 01/02/2022   Procedure: RIGHT KNEE ARTHROSCOPIC IRRIGATION AND DEBRIDEMENT;  Surgeon: Edna Toribio LABOR, MD;  Location: [facility];  Service: Orthopedics;  Laterality: Right;   INCISION AND DRAINAGE ABSCESS Right 01/26/2022   Procedure: INCISION AND DRAINAGE ABSCESS POST OP RIGHT KNEE ARTHROSCOPY;  Surgeon: Edna Toribio LABOR, MD;  Location: [facility];  Service: Orthopedics;  Laterality: Right;   KNEE ARTHROSCOPY WITH MEDIAL MENISECTOMY Left 04/22/2014   Procedure: LEFT KNEE ARTHROSCOPY WITH MEDIAL MENISECTOMY;  Surgeon: Taft BRAVO  Margrette, MD;  Location: [facility];  Service: Orthopedics;  Laterality: Left;   KNEE ARTHROSCOPY WITH MEDIAL MENISECTOMY Right 12/06/2015   Procedure: RIGHT KNEE ARTHROSCOPY WITH MEDIAL MENISECTOMY;  Surgeon: Taft FORBES Margrette, MD;  Location: [facility];  Service: Orthopedics;  Laterality: Right;   KNEE ARTHROSCOPY WITH MEDIAL MENISECTOMY Left 03/20/2017   Procedure: KNEE ARTHROSCOPY WITH MEDIAL MENISECTOMY;  Surgeon: Taft FORBES Margrette, MD;  Location: [facility];  Service: Orthopedics;  Laterality: Left;   ORIF WRIST FRACTURE Left 12/19/2016   Procedure: OPEN REDUCTION INTERNAL FIXATION (ORIF) WRIST FRACTURE;  Surgeon: Franky Curia, MD;  Location: [facility];  Service: Orthopedics;  Laterality: Left;  accumed    PARTIAL KNEE ARTHROPLASTY Left 07/21/2018   Procedure: LEFT UNICOMPARTMENTAL KNEE;  Surgeon: Beverley Evalene BIRCH, MD;  Location: [facility];  Service: Orthopedics;  Laterality: Left;   SHOULDER ARTHROCENTESIS Right 2008 or 2010   VASECTOMY  2014    Family History  Problem Relation Age of Onset   Emphysema Maternal Grandfather    Emphysema Maternal Grandmother    Clotting disorder Maternal Grandmother     Allergies  Allergen Reactions   Keflex  [Cephalexin ] Nausea Only   Metformin  Nausea And Vomiting   Symbicort  [Budesonide -Formoterol  Fumarate] Other (See Comments)    States that 3  doses were used and breathing became worse    Current Outpatient Medications on File Prior to Visit  Medication Sig Dispense Refill   albuterol  (PROVENTIL ) (2.5 MG/3ML) 0.083% nebulizer solution USE 1 VIAL VIA NEBULIZER EVERY 2 HOURS AS NEEDED FOR WHEEZING OR SHORTNESS OF BREATH 360 mL 0   amLODipine  (NORVASC ) 10 MG tablet Take 1 tablet (10 mg total) by mouth daily. for blood pressure. 90 tablet 0   atorvastatin  (LIPITOR) 40 MG tablet TAKE ONE (1) TABLET BY MOUTH EVERY DAY FOR CHOLESTEROL 90 tablet 2   blood glucose meter kit and supplies KIT Dispense based on patient and insurance preference. Use up to four times daily as directed. (Dx code E11.65) 1 each 0   buPROPion  (WELLBUTRIN  SR) 150 MG 12 hr tablet Take 1 tablet (150 mg total) by mouth 2 (two) times daily. for anxiety and depression. 180 tablet 1   cetirizine  (ZYRTEC ) 10 MG tablet Take 1 tablet (10 mg total) by mouth daily. For allergies 90 tablet 2   citalopram  (CELEXA ) 40 MG tablet Take 1 tablet (40 mg total) by mouth daily. for anxiety and depression. 90 tablet 2   empagliflozin  (JARDIANCE ) 10 MG TABS tablet Take 1 tablet (10 mg total) by mouth daily. for diabetes. 90 tablet 1   fluticasone  furoate-vilanterol (BREO ELLIPTA ) 200-25 MCG/ACT AEPB Inhale 1 puff into the lungs in the morning. 1 each 11   gabapentin  (NEURONTIN ) 600 MG tablet Take 1 tablet (600 mg total) by mouth 2 (two) times daily. For pain 180 tablet 1   glipiZIDE  (GLUCOTROL ) 10 MG tablet TAKE ONE TABLET (10MG  TOTAL) BY MOUTH TWO TIMES DAILY BEFORE A MEAL FOR DIABETES 180 tablet 0   glucose blood test strip Use as instructed to test blood sugar up to 4 times daily. (Dx code E11.65) 100 each 12   insulin  degludec (TRESIBA  FLEXTOUCH) 100 UNIT/ML FlexTouch Pen Inject 35 Units into the skin daily. for diabetes. 30 mL 1   Insulin  Pen Needle (PEN NEEDLES) 31G X 6 MM MISC Use once daily with insulin  100 each 3   Ipratropium-Albuterol  (COMBIVENT  RESPIMAT) 20-100 MCG/ACT AERS  respimat One puff every 6 hours as needed 4 g 5   Lancets MISC  Use as directed to check blood sugar up to 4 times daily. (Dx code E11.65) 100 each 3   lansoprazole  (PREVACID ) 30 MG capsule Take 1 capsule (30 mg total) by mouth 2 (two) times daily before a meal. for heartburn. 180 capsule 2   metFORMIN  (GLUCOPHAGE -XR) 500 MG 24 hr tablet Take 2 tablets (1,000 mg total) by mouth 2 (two) times daily with a meal. for diabetes. 360 tablet 0   NUCYNTA  100 MG TABS Take 1 tablet by mouth 4 (four) times daily.     OVER THE COUNTER MEDICATION Take 2 tablets by mouth every morning. Osteo Biflex triple strength with turmeric     OVER THE COUNTER MEDICATION Take 1 tablet by mouth every morning. Neuriva     tadalafil (CIALIS) 20 MG tablet Take 20 mg by mouth daily as needed for erectile dysfunction.     tamsulosin  (FLOMAX ) 0.4 MG CAPS capsule TAKE ONE CAPSULE BY MOUTH DAILY 90 capsule 1   tiZANidine (ZANAFLEX) 2 MG tablet Take 2 mg by mouth 3 (three) times daily.     zolpidem  (AMBIEN ) 10 MG tablet Take 1 tablet (10 mg total) by mouth at bedtime as needed for sleep. 90 tablet 0   hydrOXYzine  (ATARAX ) 10 MG tablet Take 1-2 tablets (10-20 mg total) by mouth 2 (two) times daily as needed. For anxiety. (Patient not taking: Reported on 08/13/2024) 180 tablet 0   No current facility-administered medications on file prior to visit.    BP 136/82   Pulse 94   Temp 98 F (36.7 C) (Temporal)   Ht 5' 10 (1.778 m)   Wt 239 lb (108.4 kg)   SpO2 96%   BMI 34.29 kg/m  Objective:   Physical Exam Cardiovascular:     Rate and Rhythm: Normal rate and regular rhythm.  Pulmonary:     Effort: Pulmonary effort is normal.     Breath sounds: Normal breath sounds.  Musculoskeletal:     Cervical back: Neck supple.  Skin:    General: Skin is warm and dry.  Neurological:     Mental Status: He is alert and oriented to person, place, and time.  Psychiatric:        Mood and Affect: Mood normal.     Physical  Exam        Assessment & Plan:  Type 2 diabetes mellitus with hyperglycemia, with long-term current use of insulin  (HCC) Assessment & Plan: Controlled with A1c of 6.9. Foot exam performed.  Continue Jardiance  10 mg daily, glipizide  10 mg BID, Metformin  1000 mg BID, Tresiba  35 mg daily.  Prescription for freestyle libre 3 sent to pharmacy.  Follow up in 6 months for recheck.  I evaluated patient, was consulted regarding treatment, and agree with assessment and plan per Kristin Rudd, MSN, FNP student.   Mallie Gaskins, NP-C   Orders: -     POCT glycosylated hemoglobin (Hb A1C) -     FreeStyle Libre 3 Plus Sensor; Use to check blood sugar continuously. Change sensor every 15 days.  Dispense: 6 each; Refill: 1  Essential hypertension Assessment & Plan: Controlled.  Continue taking Amlodipine  10 mg daily.  Follow-up in 6 months.   I evaluated patient, was consulted regarding treatment, and agree with assessment and plan per Kristin Rudd, MSN, FNP student.   Mallie Gaskins, NP-C   Orders: -     Basic metabolic panel with GFR    Assessment and Plan Assessment & Plan         [place]  MARLA Gaskins, NP    History of Present Illness

## 2024-08-13 NOTE — Progress Notes (Signed)
   Established Patient Office Visit  Subjective   Patient ID: Joseph [name], male    DOB: 18-Jul-1971  Age: 53 y.o. MRN: [id number]  No chief complaint on file.   HPI  Joseph [name] is a 53 year old male with a history of hypertension, type 2 diabetes, COPD, and obstructive sleep apnea that presents today for a follow-up on blood pressure and diabetes.  1.) Diabetes:  Current medications include:  Jardiance  10 mg tab Glipizide  10 mg tab Metformin  1000 mg BID  He is checking his/her blood glucose uses sensor libre 3 out of them need prescriptiontimes daily and is getting readings of  Last A1C: 6.9 01/30/24 6.8 today Last Eye Exam:None Last Foot Exam: 08/13/24 Pneumonia Vaccination:01/30/24 Urine Microalbumin: 1.4 Statin:Atorvastatin  40 mg  Dietary changes since last visit: No changes in diet.   Exercise: None, waiting for knee replacement.  2.) Hypertension: BP Readings from Last 3 Encounters:  08/13/24 136/82  04/22/24 132/84  04/02/24 (!) 152/88     Review of Systems  Constitutional: Negative.   HENT: Negative.    Eyes: Negative.   Respiratory: Negative.    Cardiovascular: Negative.   Gastrointestinal: Negative.   Musculoskeletal: Negative.   Skin: Negative.   Neurological:  Positive for tingling.      Objective:     There were no vitals taken for this visit.   Physical Exam HENT:     Right Ear: Tympanic membrane normal.     Left Ear: Tympanic membrane normal.  Cardiovascular:     Rate and Rhythm: Normal rate and regular rhythm.  Pulmonary:     Effort: Pulmonary effort is normal.  Skin:    General: Skin is warm and dry.  Neurological:     Mental Status: He is alert and oriented to person, place, and time.  Psychiatric:        Mood and Affect: Mood normal.      No results found for any visits on 08/13/24.    The 10-year ASCVD risk score (Arnett DK, et al., 2019) is: 25.4%    Assessment & Plan:   Problem List Items Addressed This Visit    None   No follow-ups on file.    Domenica Weightman, RN

## 2024-08-13 NOTE — Assessment & Plan Note (Addendum)
 Controlled.  Continue taking Amlodipine  10 mg daily.  Follow-up in 6 months.   I evaluated patient, was consulted regarding treatment, and agree with assessment and plan per Reneta Niehaus, MSN, FNP student.   Mallie Gaskins, NP-C

## 2024-08-13 NOTE — Assessment & Plan Note (Addendum)
 Controlled with A1c of 6.9. Foot exam performed.  Continue Jardiance  10 mg daily, glipizide  10 mg BID, Metformin  1000 mg BID, Tresiba  35 mg daily.  Prescription for freestyle libre 3 sent to pharmacy.  Follow up in 6 months for recheck.  I evaluated patient, was consulted regarding treatment, and agree with assessment and plan per Sylar Voong, MSN, FNP student.   Mallie Gaskins, NP-C

## 2024-08-13 NOTE — Patient Instructions (Addendum)
 Schedule physical in 6 months.   Controlled with A1c of 6.9. Continue Jardiance  10 mg daily, glipizide  10 mg BID, Metformin  1000 mg BID, Tresiba  35 mg daily.  Prescription for freestyle libre 3 sent to pharmacy.

## 2024-08-18 DIAGNOSIS — Z794 Long term (current) use of insulin: Secondary | ICD-10-CM | POA: Diagnosis not present

## 2024-08-18 DIAGNOSIS — E1165 Type 2 diabetes mellitus with hyperglycemia: Secondary | ICD-10-CM | POA: Diagnosis not present

## 2024-08-24 DIAGNOSIS — I10 Essential (primary) hypertension: Secondary | ICD-10-CM | POA: Diagnosis not present

## 2024-08-24 DIAGNOSIS — M25561 Pain in right knee: Secondary | ICD-10-CM | POA: Diagnosis not present

## 2024-08-24 DIAGNOSIS — K5909 Other constipation: Secondary | ICD-10-CM | POA: Diagnosis not present

## 2024-08-24 DIAGNOSIS — Z122 Encounter for screening for malignant neoplasm of respiratory organs: Secondary | ICD-10-CM | POA: Diagnosis not present

## 2024-08-24 DIAGNOSIS — F17210 Nicotine dependence, cigarettes, uncomplicated: Secondary | ICD-10-CM | POA: Diagnosis not present

## 2024-08-24 DIAGNOSIS — G8929 Other chronic pain: Secondary | ICD-10-CM | POA: Diagnosis not present

## 2024-08-24 DIAGNOSIS — E7800 Pure hypercholesterolemia, unspecified: Secondary | ICD-10-CM | POA: Diagnosis not present

## 2024-08-24 DIAGNOSIS — G4700 Insomnia, unspecified: Secondary | ICD-10-CM | POA: Diagnosis not present

## 2024-08-24 DIAGNOSIS — G4730 Sleep apnea, unspecified: Secondary | ICD-10-CM | POA: Diagnosis not present

## 2024-08-30 ENCOUNTER — Other Ambulatory Visit: Payer: Self-pay | Admitting: Primary Care

## 2024-08-30 DIAGNOSIS — G4700 Insomnia, unspecified: Secondary | ICD-10-CM

## 2024-09-14 ENCOUNTER — Other Ambulatory Visit: Payer: Self-pay | Admitting: Internal Medicine

## 2024-09-14 DIAGNOSIS — J45909 Unspecified asthma, uncomplicated: Secondary | ICD-10-CM

## 2024-09-20 ENCOUNTER — Other Ambulatory Visit: Payer: Self-pay | Admitting: Internal Medicine

## 2024-09-20 DIAGNOSIS — J45909 Unspecified asthma, uncomplicated: Secondary | ICD-10-CM

## 2024-09-23 DIAGNOSIS — M25561 Pain in right knee: Secondary | ICD-10-CM | POA: Diagnosis not present

## 2024-09-23 DIAGNOSIS — Z79899 Other long term (current) drug therapy: Secondary | ICD-10-CM | POA: Diagnosis not present

## 2024-09-23 DIAGNOSIS — G8929 Other chronic pain: Secondary | ICD-10-CM | POA: Diagnosis not present

## 2024-09-23 DIAGNOSIS — F17210 Nicotine dependence, cigarettes, uncomplicated: Secondary | ICD-10-CM | POA: Diagnosis not present

## 2024-09-23 DIAGNOSIS — I10 Essential (primary) hypertension: Secondary | ICD-10-CM | POA: Diagnosis not present

## 2024-09-23 DIAGNOSIS — E119 Type 2 diabetes mellitus without complications: Secondary | ICD-10-CM | POA: Diagnosis not present

## 2024-09-24 ENCOUNTER — Other Ambulatory Visit: Payer: Self-pay | Admitting: Internal Medicine

## 2024-09-24 DIAGNOSIS — J45909 Unspecified asthma, uncomplicated: Secondary | ICD-10-CM

## 2024-09-27 DIAGNOSIS — Z79899 Other long term (current) drug therapy: Secondary | ICD-10-CM | POA: Diagnosis not present

## 2024-09-28 DIAGNOSIS — Z794 Long term (current) use of insulin: Secondary | ICD-10-CM

## 2024-09-29 ENCOUNTER — Other Ambulatory Visit: Payer: Self-pay

## 2024-09-29 DIAGNOSIS — J45909 Unspecified asthma, uncomplicated: Secondary | ICD-10-CM

## 2024-09-29 MED ORDER — ALBUTEROL SULFATE (2.5 MG/3ML) 0.083% IN NEBU: 2.5000 mg | INHALATION_SOLUTION | RESPIRATORY_TRACT | 0 refills | Status: DC | PRN

## 2024-09-29 MED ORDER — EMPAGLIFLOZIN 10 MG PO TABS: 10.0000 mg | ORAL_TABLET | Freq: Every day | ORAL | 0 refills | Status: AC

## 2024-09-29 MED ORDER — COMBIVENT RESPIMAT 20-100 MCG/ACT IN AERS: INHALATION_SPRAY | RESPIRATORY_TRACT | 0 refills | Status: AC

## 2024-09-29 NOTE — Addendum Note (Signed)
 Addended byBETHA JESSICA BOUCHARD S on: 09/29/2024 02:30 PM   Modules accepted: Orders

## 2024-10-01 MED ORDER — PEN NEEDLES 31G X 6 MM MISC: 3 refills | Status: AC

## 2024-10-21 DIAGNOSIS — M179 Osteoarthritis of knee, unspecified: Secondary | ICD-10-CM | POA: Diagnosis not present

## 2024-10-21 DIAGNOSIS — M25561 Pain in right knee: Secondary | ICD-10-CM | POA: Diagnosis not present

## 2024-10-21 DIAGNOSIS — M25562 Pain in left knee: Secondary | ICD-10-CM | POA: Diagnosis not present

## 2024-10-21 DIAGNOSIS — Z79891 Long term (current) use of opiate analgesic: Secondary | ICD-10-CM | POA: Diagnosis not present

## 2024-10-24 DIAGNOSIS — E1165 Type 2 diabetes mellitus with hyperglycemia: Secondary | ICD-10-CM

## 2024-10-24 DIAGNOSIS — F419 Anxiety disorder, unspecified: Secondary | ICD-10-CM

## 2024-10-24 DIAGNOSIS — E119 Type 2 diabetes mellitus without complications: Secondary | ICD-10-CM

## 2024-10-26 ENCOUNTER — Telehealth: Payer: Self-pay | Admitting: Pharmacist

## 2024-10-26 MED ORDER — BUPROPION HCL ER (SR) 150 MG PO TB12: 150.0000 mg | ORAL_TABLET | Freq: Two times a day (BID) | ORAL | 0 refills | Status: AC

## 2024-10-26 MED ORDER — METFORMIN HCL ER 500 MG PO TB24: 1000.0000 mg | ORAL_TABLET | Freq: Two times a day (BID) | ORAL | 0 refills | Status: AC

## 2024-10-26 MED ORDER — GLIPIZIDE 10 MG PO TABS: 10.0000 mg | ORAL_TABLET | Freq: Two times a day (BID) | ORAL | 0 refills | Status: AC

## 2024-10-26 NOTE — Progress Notes (Signed)
 Pharmacy Quality Measure Review  This patient is appearing on the insurance-providing list for being at risk of failing the adherence measure for Statin Use in Persons with Diabetes (SUPD) medications this calendar year.   Patient prescribed atorvastatin  40 mg daily, but has not filled medication since 2023. Will route to embedded pharmacist to outreach patient to discuss and collaborate with provider for refills.   Catie IVAR Centers, PharmD, [facility] Clinical Pharmacist [phone]

## 2024-10-27 ENCOUNTER — Encounter: Payer: Self-pay | Admitting: Pharmacist

## 2024-10-27 NOTE — Progress Notes (Signed)
 Pharmacy Quality Measure Review  This patient is appearing on a report for being at risk of failing the adherence measure for cholesterol (statin) medications this calendar year.   Medication: atorvastatin  Last fill date: 01/25/2022   MyChart message sent to patient. Current prescription is expired. MyChart message sent to confirm if patient has intentionally stopped this medications.  Recent PCP notes state he should still be on atorvastatin .

## 2024-11-03 ENCOUNTER — Encounter: Payer: Self-pay | Admitting: Pharmacist

## 2024-11-03 NOTE — Progress Notes (Signed)
 Pharmacy Quality Measure Review  This patient is appearing on a report for being at risk of failing the adherence measure for cholesterol (statin) medications this calendar year.   Medication: atorvastatin  Last fill date: 2023  Recent provider notes state patient is still taking atorvastatin . Refill significantly overdue.  Per [employer], patient is currently located in [place]  and has switched pharmacy [facility]. Current atorvastatin  Rx is Expired. Refill request sent to clinic pool (Update pharmacy to [facility] [place]  where other meds have been prescribed).

## 2024-11-04 ENCOUNTER — Telehealth: Payer: Self-pay | Admitting: Internal Medicine

## 2024-11-04 DIAGNOSIS — M25561 Pain in right knee: Secondary | ICD-10-CM | POA: Diagnosis not present

## 2024-11-04 DIAGNOSIS — M25562 Pain in left knee: Secondary | ICD-10-CM | POA: Diagnosis not present

## 2024-11-04 DIAGNOSIS — M179 Osteoarthritis of knee, unspecified: Secondary | ICD-10-CM | POA: Diagnosis not present

## 2024-11-04 NOTE — Telephone Encounter (Signed)
 LVM for patient to call and discuss appointment---office may be closed due to inclement weather

## 2024-11-04 NOTE — Telephone Encounter (Signed)
 Copied from CRM #[id number]. Topic: Appointments - Scheduling Inquiry for Clinic >> Nov 04, 2024  3:26 PM Joesph PARAS wrote: Reason for CRM: Rescheduled appointment to video visit. Patient needs refills on medication and will be out on vacation for next three weeks.

## 2024-11-05 ENCOUNTER — Encounter: Payer: Self-pay | Admitting: Internal Medicine

## 2024-11-05 ENCOUNTER — Encounter: Payer: Self-pay | Admitting: Emergency Medicine

## 2024-11-05 ENCOUNTER — Telehealth: Admitting: Internal Medicine

## 2024-11-05 ENCOUNTER — Ambulatory Visit: Admitting: Internal Medicine

## 2024-11-05 DIAGNOSIS — J4530 Mild persistent asthma, uncomplicated: Secondary | ICD-10-CM

## 2024-11-05 DIAGNOSIS — F17210 Nicotine dependence, cigarettes, uncomplicated: Secondary | ICD-10-CM

## 2024-11-05 DIAGNOSIS — J45909 Unspecified asthma, uncomplicated: Secondary | ICD-10-CM

## 2024-11-05 MED ORDER — ALBUTEROL SULFATE (2.5 MG/3ML) 0.083% IN NEBU: 2.5000 mg | INHALATION_SOLUTION | RESPIRATORY_TRACT | 11 refills | Status: AC | PRN

## 2024-11-05 MED ORDER — FLUTICASONE FUROATE-VILANTEROL 200-25 MCG/ACT IN AEPB: 1.0000 | INHALATION_SPRAY | Freq: Every morning | RESPIRATORY_TRACT | 11 refills | Status: AC

## 2024-11-05 NOTE — Progress Notes (Signed)
 Subjective:   Patient ID: Joseph [name], male    DOB: 1971/06/07   MRN: [id number]    Brief patient profile:  53 yowm active smoker without significant airflow obst by PFTs 06/02/12 and 03/08/2015    History of Present Illness  In April 2013 cc  had pneumonia apparently. Went to [facility] ER and found some abnormality (nodule based on review of ER notes) on CXR that resulted in CT Angio 03/07/12 same visit. PE ruled out but found to have mediastinal nodes but the cxr nodule was not there. Apparently ER doctor said it was lymphoma and asked to see an oncologist. But primary care doctor felt that was over zealous and referred patient here. He prefers conservative mgmt to nodes if clinical suspicion for cancer is low  cc lack of energy (spent most of days yesterday in bed), esp in pollen, chronic dyspnea on exertion for 180 feet relieved by rest  But slowly progressive since 2002 for which he uses albuterol  prn on average 4 times a day.  Walkt test 185 feet x 3 laps: no desaturation  He smokes at baseline; trying quit, prior intolerance (dreams) with chantix .   Only baseline mild chronic cough with associtaed wheeze that is occasional.. Does have some B symptoms like nocturnal diaphoresis (soaks pillows). Denies weight loss.   CT ANGIOGRAPHY CHEST  Comparison: 03/07/2012  An abnormal right lower paratracheal node has a short axis diameter  of 1.6 cm. An abnormal AP window lymph node has a short axis  diameter of 1.4 cm. Mild bilateral hilar and infrahilar adenopathy  noted. Scattered small axillary lymph nodes are present.  A subcarinal node has a short axis diameter of 1.5 cm.     OV 08/14/2012 Mediastinal nodes: PET scan 06/10/12 shows very low uptake in the mediastinal nodes. Suspicion for cancer is very low. Lymph nodes include 13 mm right lower paratracheal lymph node and 10 mm prevascular lymph node. ACE level 04/10/12  - is in 20s and normal  Still dyspneic: 1 flught of steps and better  with rest and with albuterol  and atrovent  prn. Dyspne also worse in winter and cooler weathers. Rates it as moderate. STable since last visit to slightly worse. thre is some associtaed cough. No asociated weight loss. COugh is moderate and worst early in morning and in cooler temperatues.. Denies sinus drainage. PFts show mixed picture - fvc 3.4L/69%, fev1 2.9/76%, Ratio 85 but 12% bd respose. TLC 5.7/83%. DLCO 23.7/69% rec rec #MEdiastinal node  - suspicion for cancer is low  - you will need CT chest in a year; we will schedule it later  #Smoking  - glad you are working on quitting  #Shortness of breath - this is due to asthma/copd and weight  - please start QVAR  2 puff twice daily - take sample and show technique and take prescription too  - focus on weight loss through the low glycemic diet; take diet sheet from us   #FOllowup  - 3 months with spirometry at followup  - flu shot and pneumovax through PMD as discussed            COVID Nov 02 2021    05/20/2022  f/u ov/[place] office/Mikki Ziff re: AB maint on Breo 200  Chief Complaint  Patient presents with   Follow-up    Coughing has cleared up   Dyspnea:  Not limited by breathing from desired activities   Cough: none Sleeping: on cpap x years / Jude has seen most recently  SABA  use: hfa/neb rarely  02: none  Rec No change in medications  Please schedule a follow up visit in 12 months but call sooner if needed   Add: rec annual LDSCT>  ordered 07/01/2023    07/01/2023  f/u ov/[place] office/Ethelene Closser re: AB maint on Breo 200   Chief Complaint  Patient presents with   Intrinsic asthma   Dyspnea:  Not limited by breathing from desired activities  / still limited by knees  Cough: none  Sleeping: level bed cpap/ [place]  SABA use: combivent  rarely / neb 1st thing in am  02: none  Lung cancer screening: referred 07/01/2023  Rec    Virtual Visit via [facility]  11/05/2024   I connected with Joseph [name] presently in [place]  on  11/05/24 at  by [employer] link and verified that I am speaking with the correct person using two identifiers. Pt is at home and this call made from my office with no other participants    I discussed the limitations, risks, security and privacy concerns of performing an evaluation and management servicevirtually  and the availability of in person appointments. I also discussed with the patient that there may be a patient responsible charge related to this service. The patient expressed understanding and agreed to proceed.   History of Present Illness: BREO  200 each am / still smoking one pack per pday  Dyspnea:   limited by R knee more than breathing  Cough: none Sleeping: prone/ bed is horizontal / 3 pillows  SABA use:  three times per week if out in cold /  neb each am cause Ranell is too slow to get started working 02: none    No obvious day to day or daytime variability or assoc excess/ purulent sputum or mucus plugs or hemoptysis or cp or chest tightness, subjective wheeze or overt sinus or hb symptoms.    Also denies any other obvious fluctuation of symptoms with weather or environmental changes or other aggravating or alleviating factors except as outlined above.   Meds reviewed/ med reconciliation completed         Observations/Objective: Alert and approriate s any conversational dyspnea or audible wheeze   Assessment & Plan Cigarette smoker Referred for LCS  07/01/2023  and again 11/05/2024   Low-dose CT lung cancer screening is recommended for patients who are 54-68 years of age with a 20+ pack-year history of smoking and who are currently smoking or quit <=15 years ago. No coughing up blood  No unintentional weight loss of > 15 pounds in the last 6 months - pt is eligible for scanning yearly until  53 y p quits smoking > agrees to proceed with LCS program   Counseled re importance of smoking cessation but did not meet time criteria for separate billing     Mild  persistent asthma without complication  Active smoker - PFT's s sign copd 06/2012 - med calendar 2/13 /15 > not using 11/29/14  - 11/29/2014 p extensive coaching HFA effectiveness =    90% from a baseline of 50%  -PFTs wnl 03/08/2015 4 h p alb neb> rec MCT at least 6 h p neb > done 10/12/2015  POS Methacholine  challenge > resume dulera  100 2bid - 10/31/2015    increased dulera  to 200 bid (samples provided)  - 05/23/2017  BREO 200 rx initiated > much better  - Spirometry 11/10/2018  FEV1 3.0 (76%)  Ratio 84 s curvature after am BREO 200  - 01/04/2020  After extensive coaching inhaler device,  effectiveness =    90% > change back to dulera  200 2bid due to severe cough on breo and overuse of saba  - 05/17/2020 req to go back to breo 200 > improved 08/18/2020 and at virtual ov 11/05/2024 x for cold weather exp/ still needs albuterol  q am because the AM breato 200 does not work quickly enough for him  No change in rx needed/ annual f/u is approp as long as doing this well     AVS  Patient Instructions  My office will be contacting you by phone for referral to lung cancer screening   [phone]) - if you don't hear back from my office within one week,  please call us  back or notify us  thru MyChart and we'll address it right away.       Ozell America, MD 11/05/2024   .  I provided 30  minutes of non-face-to-face time during this encounter.   Ozell America, MD                                         Assessment & Plan:

## 2024-11-05 NOTE — Assessment & Plan Note (Signed)
 Active smoker - PFT's s sign copd 06/2012 - med calendar 2/13 /15 > not using 11/29/14  - 11/29/2014 p extensive coaching HFA effectiveness =    90% from a baseline of 50%  -PFTs wnl 03/08/2015 4 h p alb neb> rec MCT at least 6 h p neb > done 10/12/2015  POS Methacholine  challenge > resume dulera  100 2bid - 10/31/2015    increased dulera  to 200 bid (samples provided)  - 05/23/2017  BREO 200 rx initiated > much better  - Spirometry 11/10/2018  FEV1 3.0 (76%)  Ratio 84 s curvature after am BREO 200  - 01/04/2020  After extensive coaching inhaler device,  effectiveness =    90% > change back to dulera  200 2bid due to severe cough on breo and overuse of saba  - 05/17/2020 req to go back to breo 200 > improved 08/18/2020 and at virtual ov 11/05/2024 x for cold weather exp/ still needs albuterol  q am because the AM breato 200 does not work quickly enough for him  No change in rx needed/ annual f/u is approp as long as doing this well

## 2024-11-05 NOTE — Patient Instructions (Signed)
 My office will be contacting you by phone for referral to lung cancer screening   [phone]) - if you don't hear back from my office within one week,  please call us  back or notify us  thru MyChart and we'll address it right away.

## 2024-11-05 NOTE — Assessment & Plan Note (Addendum)
 Referred for LCS  07/01/2023  and again 11/05/2024   Low-dose CT lung cancer screening is recommended for patients who are 14-54 years of age with a 20+ pack-year history of smoking and who are currently smoking or quit <=15 years ago. No coughing up blood  No unintentional weight loss of > 15 pounds in the last 6 months - pt is eligible for scanning yearly until  14 y p quits smoking > agrees to proceed with LCS program   Counseled re importance of smoking cessation but did not meet time criteria for separate billing

## 2024-11-05 NOTE — Telephone Encounter (Signed)
 Called to inform pt we would go over meds at his visit today

## 2024-11-05 NOTE — Assessment & Plan Note (Deleted)
 Active smoker - PFT's s sign copd 06/2012 - med calendar 2/13 /15 > not using 11/29/14  - 11/29/2014 p extensive coaching HFA effectiveness =    90% from a baseline of 50%  -PFTs wnl 03/08/2015 4 h p alb neb> rec MCT at least 6 h p neb > done 10/12/2015  POS Methacholine  challenge > resume dulera  100 2bid - 10/31/2015    increased dulera  to 200 bid (samples provided)  - 05/23/2017  BREO 200 rx initiated > much better  - Spirometry 11/10/2018  FEV1 3.0 (76%)  Ratio 84 s curvature after am BREO 200  - 01/04/2020  After extensive coaching inhaler device,  effectiveness =    90% > change back to dulera  200 2bid due to severe cough on breo and overuse of saba  - 05/17/2020 req to go back to breo 200 > improved 08/18/2020 and at virtual ov 11/05/2024 x for cold weather exp/ still needs albuterol  q am because the AM breato 200 does not work quickly enough for him  No change in rx needed/ annual f/u is approp as long as doing this well

## 2024-11-08 DIAGNOSIS — M21161 Varus deformity, not elsewhere classified, right knee: Secondary | ICD-10-CM | POA: Diagnosis not present

## 2024-11-08 DIAGNOSIS — Z96652 Presence of left artificial knee joint: Secondary | ICD-10-CM | POA: Diagnosis not present

## 2024-11-08 DIAGNOSIS — M1711 Unilateral primary osteoarthritis, right knee: Secondary | ICD-10-CM | POA: Diagnosis not present

## 2024-11-08 DIAGNOSIS — M2419 Other articular cartilage disorders, other specified site: Secondary | ICD-10-CM | POA: Diagnosis not present

## 2024-11-08 DIAGNOSIS — M25562 Pain in left knee: Secondary | ICD-10-CM | POA: Diagnosis not present

## 2024-11-12 DIAGNOSIS — M25562 Pain in left knee: Secondary | ICD-10-CM | POA: Diagnosis not present

## 2024-11-12 DIAGNOSIS — K5903 Drug induced constipation: Secondary | ICD-10-CM | POA: Diagnosis not present

## 2024-11-12 DIAGNOSIS — M179 Osteoarthritis of knee, unspecified: Secondary | ICD-10-CM | POA: Diagnosis not present

## 2024-11-12 DIAGNOSIS — M25561 Pain in right knee: Secondary | ICD-10-CM | POA: Diagnosis not present

## 2024-11-12 DIAGNOSIS — E119 Type 2 diabetes mellitus without complications: Secondary | ICD-10-CM | POA: Diagnosis not present

## 2024-11-17 DIAGNOSIS — I10 Essential (primary) hypertension: Secondary | ICD-10-CM

## 2024-11-17 MED ORDER — AMLODIPINE BESYLATE 10 MG PO TABS: 10.0000 mg | ORAL_TABLET | Freq: Every day | ORAL | 0 refills | Status: AC

## 2024-11-29 DIAGNOSIS — G4700 Insomnia, unspecified: Secondary | ICD-10-CM

## 2024-11-29 MED ORDER — ZOLPIDEM TARTRATE 10 MG PO TABS: 10.0000 mg | ORAL_TABLET | Freq: Every evening | ORAL | 0 refills | Status: AC | PRN

## 2024-12-21 NOTE — Progress Notes (Signed)
 Joseph [name]                                          MRN: [id number]   12/21/2024   The [facility] Quality Team Specialist reviewed this patient medical record for the purposes of chart review for care gap closure. The following were reviewed: chart review for care gap closure-diabetic eye exam.    [facility] Quality Team

## 2025-01-03 DIAGNOSIS — K219 Gastro-esophageal reflux disease without esophagitis: Secondary | ICD-10-CM

## 2025-01-03 DIAGNOSIS — F419 Anxiety disorder, unspecified: Secondary | ICD-10-CM

## 2025-01-03 MED ORDER — LANSOPRAZOLE 30 MG PO CPDR: 30.0000 mg | DELAYED_RELEASE_CAPSULE | Freq: Two times a day (BID) | ORAL | 0 refills | Status: AC

## 2025-01-03 MED ORDER — CITALOPRAM HYDROBROMIDE 40 MG PO TABS: 40.0000 mg | ORAL_TABLET | Freq: Every day | ORAL | 0 refills | Status: AC

## 2025-02-01 ENCOUNTER — Ambulatory Visit: Payer: Medicare HMO
